# Patient Record
Sex: Female | Born: 1950 | Race: White | Hispanic: No | Smoking: Never smoker
Health system: Southern US, Community
[De-identification: ages and names within clinical notes are randomized; demographics above are authoritative.]

## PROBLEM LIST (undated history)

## (undated) DIAGNOSIS — I10 Essential (primary) hypertension: Secondary | ICD-10-CM

## (undated) DIAGNOSIS — B159 Hepatitis A without hepatic coma: Secondary | ICD-10-CM

## (undated) DIAGNOSIS — E119 Type 2 diabetes mellitus without complications: Secondary | ICD-10-CM

## (undated) DIAGNOSIS — E539 Vitamin B deficiency, unspecified: Secondary | ICD-10-CM

## (undated) DIAGNOSIS — F209 Schizophrenia, unspecified: Secondary | ICD-10-CM

## (undated) DIAGNOSIS — G909 Disorder of the autonomic nervous system, unspecified: Secondary | ICD-10-CM

## (undated) DIAGNOSIS — F32A Depression, unspecified: Secondary | ICD-10-CM

## (undated) DIAGNOSIS — M1990 Unspecified osteoarthritis, unspecified site: Secondary | ICD-10-CM

## (undated) DIAGNOSIS — E7800 Pure hypercholesterolemia, unspecified: Secondary | ICD-10-CM

## (undated) DIAGNOSIS — E559 Vitamin D deficiency, unspecified: Secondary | ICD-10-CM

## (undated) DIAGNOSIS — J309 Allergic rhinitis, unspecified: Secondary | ICD-10-CM

## (undated) DIAGNOSIS — N19 Unspecified kidney failure: Secondary | ICD-10-CM

## (undated) DIAGNOSIS — K219 Gastro-esophageal reflux disease without esophagitis: Secondary | ICD-10-CM

## (undated) DIAGNOSIS — G4700 Insomnia, unspecified: Secondary | ICD-10-CM

## (undated) DIAGNOSIS — M109 Gout, unspecified: Secondary | ICD-10-CM

## (undated) DIAGNOSIS — D649 Anemia, unspecified: Secondary | ICD-10-CM

## (undated) DIAGNOSIS — G9341 Metabolic encephalopathy: Secondary | ICD-10-CM

## (undated) DIAGNOSIS — J189 Pneumonia, unspecified organism: Secondary | ICD-10-CM

## (undated) DIAGNOSIS — F329 Major depressive disorder, single episode, unspecified: Secondary | ICD-10-CM

## (undated) DIAGNOSIS — I639 Cerebral infarction, unspecified: Secondary | ICD-10-CM

## (undated) DIAGNOSIS — A498 Other bacterial infections of unspecified site: Secondary | ICD-10-CM

## (undated) DIAGNOSIS — N179 Acute kidney failure, unspecified: Secondary | ICD-10-CM

## (undated) DIAGNOSIS — K7581 Nonalcoholic steatohepatitis (NASH): Secondary | ICD-10-CM

## (undated) HISTORY — DX: Disorder of the autonomic nervous system, unspecified: G90.9

## (undated) HISTORY — DX: Metabolic encephalopathy: G93.41

## (undated) HISTORY — DX: Hepatitis a without hepatic coma: B15.9

## (undated) HISTORY — DX: Anemia, unspecified: D64.9

## (undated) HISTORY — DX: Other bacterial infections of unspecified site: A49.8

## (undated) HISTORY — DX: Vitamin D deficiency, unspecified: E55.9

## (undated) HISTORY — DX: Insomnia, unspecified: G47.00

## (undated) HISTORY — DX: Vitamin B deficiency, unspecified: E53.9

## (undated) HISTORY — DX: Unspecified kidney failure: N19

## (undated) HISTORY — DX: Nonalcoholic steatohepatitis (NASH): K75.81

## (undated) HISTORY — DX: Gastro-esophageal reflux disease without esophagitis: K21.9

## (undated) HISTORY — PX: CYST EXCISION: SHX5701

## (undated) HISTORY — DX: Allergic rhinitis, unspecified: J30.9

## (undated) HISTORY — PX: TONSILLECTOMY: SUR1361

## (undated) HISTORY — PX: DENTAL SURGERY: SHX609

## (undated) MED FILL — Dexamethasone Sodium Phosphate Inj 100 MG/10ML: INTRAMUSCULAR | Qty: 1 | Status: AC

---
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
## 1998-11-09 ENCOUNTER — Other Ambulatory Visit: Admission: RE | Admit: 1998-11-09 | Discharge: 1998-11-09 | Payer: Self-pay | Admitting: Family Medicine

## 1999-04-13 ENCOUNTER — Ambulatory Visit (HOSPITAL_COMMUNITY): Admission: RE | Admit: 1999-04-13 | Discharge: 1999-04-13 | Payer: Self-pay | Admitting: Family Medicine

## 1999-04-13 ENCOUNTER — Encounter: Payer: Self-pay | Admitting: Family Medicine

## 2000-02-17 ENCOUNTER — Encounter: Payer: Self-pay | Admitting: Family Medicine

## 2000-02-17 ENCOUNTER — Ambulatory Visit (HOSPITAL_COMMUNITY): Admission: RE | Admit: 2000-02-17 | Discharge: 2000-02-17 | Payer: Self-pay | Admitting: Family Medicine

## 2000-11-27 ENCOUNTER — Other Ambulatory Visit: Admission: RE | Admit: 2000-11-27 | Discharge: 2000-11-27 | Payer: Self-pay | Admitting: Family Medicine

## 2000-12-04 ENCOUNTER — Ambulatory Visit (HOSPITAL_COMMUNITY): Admission: RE | Admit: 2000-12-04 | Discharge: 2000-12-04 | Payer: Self-pay | Admitting: Family Medicine

## 2001-12-09 ENCOUNTER — Ambulatory Visit (HOSPITAL_COMMUNITY): Admission: RE | Admit: 2001-12-09 | Discharge: 2001-12-09 | Payer: Self-pay | Admitting: Family Medicine

## 2002-08-10 ENCOUNTER — Ambulatory Visit (HOSPITAL_COMMUNITY): Admission: RE | Admit: 2002-08-10 | Discharge: 2002-08-10 | Payer: Self-pay | Admitting: Internal Medicine

## 2002-08-10 ENCOUNTER — Encounter: Payer: Self-pay | Admitting: Internal Medicine

## 2002-10-01 ENCOUNTER — Encounter: Payer: Self-pay | Admitting: Internal Medicine

## 2002-10-01 ENCOUNTER — Encounter: Admission: RE | Admit: 2002-10-01 | Discharge: 2002-10-01 | Payer: Self-pay | Admitting: Internal Medicine

## 2002-10-15 ENCOUNTER — Ambulatory Visit: Admission: RE | Admit: 2002-10-15 | Discharge: 2002-10-15 | Payer: Self-pay | Admitting: Orthopedic Surgery

## 2002-12-01 ENCOUNTER — Other Ambulatory Visit: Admission: RE | Admit: 2002-12-01 | Discharge: 2002-12-01 | Payer: Self-pay | Admitting: Family Medicine

## 2002-12-12 ENCOUNTER — Ambulatory Visit (HOSPITAL_COMMUNITY): Admission: RE | Admit: 2002-12-12 | Discharge: 2002-12-12 | Payer: Self-pay | Admitting: Internal Medicine

## 2003-06-10 ENCOUNTER — Ambulatory Visit (HOSPITAL_COMMUNITY): Admission: RE | Admit: 2003-06-10 | Discharge: 2003-06-10 | Payer: Self-pay | Admitting: Internal Medicine

## 2004-01-07 ENCOUNTER — Ambulatory Visit (HOSPITAL_COMMUNITY): Admission: RE | Admit: 2004-01-07 | Discharge: 2004-01-07 | Payer: Self-pay | Admitting: Family Medicine

## 2004-04-05 ENCOUNTER — Ambulatory Visit: Payer: Self-pay | Admitting: Nurse Practitioner

## 2004-04-14 ENCOUNTER — Ambulatory Visit: Payer: Self-pay | Admitting: Nurse Practitioner

## 2004-07-06 ENCOUNTER — Ambulatory Visit: Payer: Self-pay | Admitting: Nurse Practitioner

## 2004-10-06 ENCOUNTER — Ambulatory Visit: Payer: Self-pay | Admitting: Nurse Practitioner

## 2004-10-19 ENCOUNTER — Ambulatory Visit: Payer: Self-pay | Admitting: Nurse Practitioner

## 2004-12-13 ENCOUNTER — Ambulatory Visit: Payer: Self-pay | Admitting: Nurse Practitioner

## 2005-02-08 ENCOUNTER — Ambulatory Visit (HOSPITAL_COMMUNITY): Admission: RE | Admit: 2005-02-08 | Discharge: 2005-02-08 | Payer: Self-pay | Admitting: Nurse Practitioner

## 2005-03-28 ENCOUNTER — Ambulatory Visit: Payer: Self-pay | Admitting: Nurse Practitioner

## 2005-05-17 ENCOUNTER — Ambulatory Visit: Payer: Self-pay | Admitting: Nurse Practitioner

## 2005-06-21 ENCOUNTER — Ambulatory Visit: Payer: Self-pay | Admitting: Family Medicine

## 2005-06-22 ENCOUNTER — Ambulatory Visit (HOSPITAL_COMMUNITY): Admission: RE | Admit: 2005-06-22 | Discharge: 2005-06-22 | Payer: Self-pay | Admitting: Family Medicine

## 2005-07-04 ENCOUNTER — Ambulatory Visit: Payer: Self-pay | Admitting: Nurse Practitioner

## 2005-08-31 ENCOUNTER — Ambulatory Visit: Payer: Self-pay | Admitting: Nurse Practitioner

## 2005-11-28 ENCOUNTER — Ambulatory Visit: Payer: Self-pay | Admitting: Nurse Practitioner

## 2006-01-11 ENCOUNTER — Ambulatory Visit: Payer: Self-pay | Admitting: Nurse Practitioner

## 2006-03-01 ENCOUNTER — Ambulatory Visit (HOSPITAL_COMMUNITY): Admission: RE | Admit: 2006-03-01 | Discharge: 2006-03-01 | Payer: Self-pay | Admitting: Family Medicine

## 2006-08-02 ENCOUNTER — Ambulatory Visit: Payer: Self-pay | Admitting: Internal Medicine

## 2006-08-20 ENCOUNTER — Ambulatory Visit: Payer: Self-pay | Admitting: Internal Medicine

## 2006-09-28 ENCOUNTER — Observation Stay (HOSPITAL_COMMUNITY): Admission: EM | Admit: 2006-09-28 | Discharge: 2006-09-29 | Payer: Self-pay | Admitting: Emergency Medicine

## 2006-09-28 ENCOUNTER — Ambulatory Visit: Payer: Self-pay | Admitting: Family Medicine

## 2006-10-04 ENCOUNTER — Ambulatory Visit: Payer: Self-pay | Admitting: Family Medicine

## 2006-10-29 ENCOUNTER — Ambulatory Visit: Payer: Self-pay | Admitting: Cardiovascular Disease

## 2006-11-13 ENCOUNTER — Ambulatory Visit: Payer: Self-pay

## 2007-03-06 ENCOUNTER — Ambulatory Visit (HOSPITAL_COMMUNITY): Admission: RE | Admit: 2007-03-06 | Discharge: 2007-03-06 | Payer: Self-pay | Admitting: Family Medicine

## 2007-03-26 ENCOUNTER — Emergency Department (HOSPITAL_COMMUNITY): Admission: EM | Admit: 2007-03-26 | Discharge: 2007-03-26 | Payer: Self-pay | Admitting: Emergency Medicine

## 2007-04-19 ENCOUNTER — Emergency Department (HOSPITAL_COMMUNITY): Admission: EM | Admit: 2007-04-19 | Discharge: 2007-04-19 | Payer: Self-pay | Admitting: Emergency Medicine

## 2007-05-01 ENCOUNTER — Emergency Department (HOSPITAL_COMMUNITY): Admission: EM | Admit: 2007-05-01 | Discharge: 2007-05-01 | Payer: Self-pay | Admitting: Emergency Medicine

## 2007-09-17 ENCOUNTER — Encounter: Admission: RE | Admit: 2007-09-17 | Discharge: 2007-09-17 | Payer: Self-pay | Admitting: Internal Medicine

## 2007-10-25 ENCOUNTER — Ambulatory Visit: Payer: Self-pay | Admitting: Cardiovascular Disease

## 2008-03-27 ENCOUNTER — Ambulatory Visit (HOSPITAL_COMMUNITY): Admission: RE | Admit: 2008-03-27 | Discharge: 2008-03-27 | Payer: Self-pay | Admitting: Internal Medicine

## 2009-10-21 ENCOUNTER — Ambulatory Visit (HOSPITAL_COMMUNITY): Admission: RE | Admit: 2009-10-21 | Discharge: 2009-10-21 | Payer: Self-pay | Admitting: Internal Medicine

## 2010-02-27 ENCOUNTER — Encounter: Payer: Self-pay | Admitting: Internal Medicine

## 2010-06-21 NOTE — H&P (Signed)
Petty, Heather NO.:  [id number]   MEDICAL RECORD NO.:  [id number]          PATIENT TYPE:  INP   LOCATION:  3705                         FACILITY:  [facility]   PHYSICIAN:  Talbert Cage, M.D.DATE OF BIRTH:  May 31, 1950   DATE OF ADMISSION:  09/28/2006  DATE OF DISCHARGE:                              HISTORY & PHYSICAL   CHIEF COMPLAINT:  Chest pain.   HISTORY OF PRESENT ILLNESS:  Heather Petty is a 60 year old female who  presented to the emergency department today complaining of chest pain  with associated nausea, diarrhea and pain into her bilateral arms and  left side of her neck.  The chest pain is actually a tightness on her  left side.  Her arms hurt and also feel very heavy.  She has been having  this chest discomfort on and off for one week, usually lasting 4-5  minutes, however, yesterday the pain lasted all afternoon and evening  and was associated with severe nausea, cold sweats and dyspnea.  The  pain occurred again this morning but lasted only 30 minutes.  When the  pain happened again this morning her daughter asked her to come to the  ED.  The pain occurred again when she arrived at the ED and was relieved  by one sublingual nitroglycerin.  Her previous episodes of pain came on  with rest, were worse with exertion and eventually abated on their own.  Prior to this week she has not had this pain before.   REVIEW OF SYSTEMS:  Positive for increase in fatigue over one week on  top of chronic fatigue and diarrhea for three days ending approximately  two days ago.  Negative for abdominal pain, fever, cough or sick  contacts.   PAST MEDICAL HISTORY:  1. Diabetes mellitus type 2.  2. Hypertension.  3. Hyperlipidemia.  4. Bipolar disorder.  5. Question of a MRSA boil one year ago.  6. Tonsillectomy as a child.   MEDICATIONS:  1. Benazepril/hydrochlorothiazide 10/12.5.  2. Doxepin 25 mg at bedtime.  3. Omeprazole dose unknown.  4.  Cyclobenzaprine dose unknown.  5. Zetia dose unknown.  6. Simvastatin dose unknown.  7. Glipizide unknown mg b.i.d.  8. Aspirin.  9. Actos.   FAMILY HISTORY:  Unknown as the patient is adopted.   SOCIAL HISTORY:  The patient is disabled secondary to mental illness.  She lives alone.  She has a son who lives in [place] and a daughter  who lives here.  She uses a walker to walk and sleeps on three pillows.  She quit smoking about 27 years ago and denies alcohol use.   PHYSICAL EXAMINATION:  VITAL SIGNS:  Temperature 97.7, pulse 76,  respirations 18, blood pressure ranging in the ED from 129-164/65-78,  139/74 at the time of exam.  Satting 99% on room air.  GENERAL:  The patient is alert and oriented, in no acute distress,  obese, pleasant and cooperative.  HEENT:  Normocephalic, atraumatic.  Extraocular movements intact.  NECK:  Full range of motion without lymphadenopathy.  CV:  Regular rate and rhythm without murmurs, rubs or gallops.  LUNGS:  Clear to auscultation bilaterally without wheezes or crackles.  ABDOMEN:  Soft, nontender, nondistended.  Positive bowel sounds.  EXTREMITIES:  1+ pitting edema bilaterally up to the level of the knee.  Of note, the exam was limited because the patient was roomed out in the  hallway of the ED.   LABORATORY DATA:  Sodium 136, potassium 3.0, creatinine 0.6, glucose  362, point of care cardiac enzymes were negative with a CKMB of 1.0 and  a troponin I of less than 0.05.  An EKG showed normal sinus rhythm and a  chest x-ray showed no acute abnormalities and a density on the right  heart border likely epicardial fat.   ASSESSMENT AND PLAN:  This is a 60 year old female with unstable angina.   1. We will admit her to a telemetry bed.  Will give aspirin, beta      blocker and nitroglycerin.  Will check a fasting lipid panel in the      morning for risk stratification.  We will recheck an EKG in the      morning.  We will give Protonix daily  in case this is of GI origin.      The patient will likely need a cath or Cardiolite, however, the      question is if she needs inpatient or outpatient.  Will get cardiac      enzymes q.6h. x2 and if they remain negative and her EKG does not      change and she has no further episodes of chest pain, we will set      her up with an outpatient cardiology evaluation.  If she does not      rule out or she has continued chest pain, we will consult      cardiology in the morning for an inpatient workup.  2. Diabetes.  We will check an A1c and continue the patient on the      home regimen of Actos and glipizide.  As the exact doses are not      known, we will start Actos 30 mg daily and glipizide 5 mg b.i.d.      and adjust as necessary.  3. Hyperlipidemia.  Will start Zocor 40 mg daily and Zetia 10 mg      daily.  Will check a fasting lipid panel in the morning.  4. Hypertension.  The patient is having chest pain.  We will start her      on metoprolol 60 mg b.i.d. and monitor for bradycardia.  We will      hold her benazepril for the time being.  5. FEN.  We will give her a carb modified low sodium diet and hold on      fluids as the patient is taking p.o.  6. Bipolar disorder.  Will continue her home dose of doxepin.  7. Prophylaxis.  Will place SCDs and give Protonix as stated above.  8. Hypokalemia.  K-Dur 40 mEq was given in the ER.  Will check a BMP      in the morning and replace as necessary.      Heather Mustard, MD  Electronically Signed      Talbert Cage, M.D.  Electronically Signed   LM/MEDQ  D:  09/28/2006  T:  09/29/2006  Job:  [id number]

## 2010-06-21 NOTE — Assessment & Plan Note (Signed)
[facility] OFFICE NOTE   JUNE, RODE                     MRN:          [id number]  DATE:10/25/2007                            DOB:          Mar 19, 1950    Heather Petty returns today for followup.  She continues to have atypical  chest pain.  I suspect it is muscular or related to cervical spine  problems.  She is morbidly obese.  She gets around with a walker.  She  is now seeing Dr. Morene Antu, I believe at [facility].  Despite having  multiple coronary risk factors including hypercholesterolemia, diabetes,  and hypertension, she had a totally normal Myoview study done on November 13, 2006.  Her echocardiogram was also normal with an EF of 65% and no  significant valvular heart disease.   I had a long discussion with Heather Petty.  Clearly, she needs to do  something about her weight.  She is already having arthritic problems in  her hip and knees.  It has caused high blood pressure, high  cholesterolemia, and diabetes.  She is not very motivated to quit  eating.  This is a reflex she has in regards to anxiety and stress.  I  told her to talk to her medical doctor.  She should probably be referred  for possible lap band surgery.   From a cardiac perspective, she is stable.  She has atypical pain  without evidence of coronary disease and she can be seen on an as-needed  basis.   Her current meds include:  1. Benazepril 10/12.5.  2. Actos 15 a day.  3. Glipizide 10 b.i.d.  4. Doxepin 1 in the morning and 2 at night.  5. Cimetidine b.i.d.  6. Simvastatin 40.  7. Zetia 10 a day.  8. Hydrochlorothiazide 12.5 a day.  9. Aspirin a day.   PHYSICAL EXAMINATION:  GENERAL:  Her exam is remarkable for an  overweight Hispanic female in no distress.  VITAL SIGNS:  Her weight is 232, blood pressure 120/79, pulse 71 and  regular, respiratory rate 14, afebrile.  HEENT:  Unremarkable.  Carotids are without bruit.  No  lymphadenopathy,  thyromegaly, or JVP elevation.  LUNGS:  Clear to good diaphragmatic motion.  No wheezing.  HEART:  S1 and S2 with distant heart sounds.  PMI normal.  ABDOMEN:  Benign.  Bowel sounds positive.  No AAA.  No tenderness.  No  bruit.  No hepatosplenomegaly.  No hepatojugular reflux. No tenderness.  EXTREMITIES:  Distal pulse intact, +1 edema.  NEURO:  Nonfocal.  SKIN:  Warm and dry.  No muscular weakness.   EKG shows poor R-wave progression.   IMPRESSION:  1. Atypical chest pain.  Normal Myoview, normal echo.  I doubt      coronary disease.  Follow up p.r.n.  2. Diabetes.  Continue oral hypoglycemics, try to get rid of Actos      given her fluid retention.  3. Lower extremity edema.  Continue hydrochlorothiazide, low-salt      diet.  4. Central obesity.  Consider referral for lap band surgery.  5. Hypertension.  Currently well controlled.  Continue current dose of      benazepril.  She will follow up with [facility] and see [place]      on a p.r.n. basis.     Wallis Bamberg. Johnsie Cancel, MD, [facility]  Electronically Signed    PCN/MedQ  DD: 10/25/2007  DT: 10/26/2007  Job #: [id number]

## 2010-06-21 NOTE — Assessment & Plan Note (Signed)
Heather Petty NOTE   Heather Petty, Heather Petty                     MRN:          [id number]  DATE:10/29/2006                            DOB:          April 18, 1950    [name] is a 60 year old patient of Dr. Simeon Petty in [place].  She was  recently admitted to the hospital on August 22 to September 29, 2006, for  chest pain.  We were asked to help evaluate this and rule out coronary  artery disease.  The patient also has had exertional dyspnea.   Coronary risk factors include hypercholesterolemia, hypertension,  diabetes.  She is a nonsmoker.  She is fairly sedentary.  On the day  prior to her admission she had mild chest pain; however, most of her  symptoms involved leg heaviness and arm pain.  It was atypical, it was  not necessarily exertional, it lasted on and off throughout the day.  There was no pleuritic component.  The heaviness in the legs was not  sharp, it was not related to exertion, did not sound like claudication.  The heaviness in her arms also was not related to exertion.  The patient  is fairly sedentary.  She does not walk on a regular basis.  She uses a  walker due to osteoarthritis in both knees.  There is no previously  documented coronary artery disease.  She had an Adenosine Myoview in our  [facility] in September of 2004 which was normal with an EF of 75%.  She  also had a 2D echocardiogram which was normal at that same time.   She has not had followup since then.  She has not required  hospitalization since hospital discharge.  Her symptoms of chest pain,  arm pain and leg heaviness have resolved.   At the time of her problems the pain was not relieved with  nitroglycerin.   During her hospitalization her blood pressure medicines were adjusted.  She was started on beta-blockers and referred here for further followup.   REVIEW OF SYSTEMS:  Remarkable for significant knee pain.  She was  supposed to have knee surgery with Dr. Ronnie Petty back in 2004 but did not  want to have it.  She continues to suffer from bilateral osteoarthritis  and now walks with a walker.  Review of systems otherwise negative.   PMH:  HTN, Hypercholesterolemia, Diabetes, Osteoarthritis   MEDICATIONS:  1. Benazepril 10/12.5.  2. Actos, dose unknown.  3. Glipizide b.i.d., dose unknown.  4. Doxepin, one in the morning, two at night.  5. Famotidine 20 b.i.d.  6. Cyclobenzaprine daily.  7. Ibuprofen.  8. Simvastatin 40 a day.  9. An aspirin a day.  10.Zetia a day.  11.She is not clear of the beta-blocker that she was discharged on.   She denies any allergies.   FAMILY HISTORY:  Noncontributory.   The patient is divorced.  She has two older children, one who lives here  in town and one who lives in [place].  She does not work.  She sees  [facility] for her medical care.  She  continues to want to avoid any  surgery despite the debility in her legs.  She does not smoke or drink.  She is originally from [place].   EXAM:  Remarkable for an overweight, middle-aged, white female who  appears older that her stated age.  Affect is appropriate, weight is  219, blood pressure is 150/80, pulse is 84 and regular, respiratory rate  is 14, she is afebrile.  HEENT:  Normal.  CAROTIDS;  Normal without bruit.  There is no lymphadenopathy, no  thyromegaly, no JVD elevation.  LUNGS:  Clear with good diaphragmatic motion, no wheezing.  There is an  S1, S2 with distant heart sounds.  PMI is not palpable.  ABDOMEN:  Benign.  Bowel sounds positive.  There is no tenderness, no  hepatosplenomegaly, no hepatojugular reflux, no AAA, no renal bruits.  Femorals are deep and not palpable.  LOWER EXTREMITIES:  Intact pulses, trace edema, PTs are +2.  NEURO:  Nonfocal.  Skin is warm and dry.  There is no muscular weakness.   EKG showed a sinus rhythm with low voltage, poor R wave progression.   IMPRESSION:  1.  Recent hospitalization for chest pain, multiple coronary risk      factors, followup Adenosine Myoview.  2. Exertional dyspnea and an overweight female with poor R wave      progression on her EKG.  Check 2D echocardiogram to assess left      ventricular function and rule out silent anterior wall myocardial      infarction.  3. Diabetes.  Follow up with [facility].  Continue current oral      hypoglycemics, hemoglobin A1c quarterly.  4. Hyperlipidemia.  Continue simvastatin 40 a day and we will check      her records but I suspect she had LFTs during her recent      hospitalization.  5. Bilateral osteoarthritis.  I encouraged the patient, particularly      if her heart tests turn out normal, to follow up with Dr. Ronnie Petty.      She clearly has limiting osteoarthritis and would benefit from a      knee replacement.  6. Hypertension, currently fairly well controlled.  Continue      benazepril and diuretic.  Patient's activity levels are limited due      to her knee pain, which is one of the reasons I encouraged her to      seek further care for this.   I will see her back in a year so long as her Myoview and echo are low  risk.     Wallis Bamberg. Heather Cancel, MD, [facility]  Electronically Signed    PCN/MedQ  DD: 10/29/2006  DT: 10/29/2006  Job #: [id number]

## 2010-06-21 NOTE — Discharge Summary (Signed)
Petty, [name] NO.:  [id number]   MEDICAL RECORD NO.:  [id number]          PATIENT TYPE:  INP   LOCATION:  3705                         FACILITY:  [facility]   PHYSICIAN:  Orland Mustard, MD       DATE OF BIRTH:  January 28, 1951   DATE OF ADMISSION:  09/28/2006  DATE OF DISCHARGE:  09/29/2006                               DISCHARGE SUMMARY   PRIMARY CARE PHYSICIAN:  The patient receives her medical care from  [facility].   REASON FOR ADMISSION:  Ongoing chest pain, worrisome for unstable  angina.   ADMISSION LABORATORY:  Potassium 3.0, blood glucose 362, creatinine 0.6.  Point of care cardiac enzymes were negative.   ADMISSION STUDIES:  EKG showed normal sinus rhythm and a chest x-ray  showed no acute abnormality.   PRIMARY DISCHARGE DIAGNOSES:  1. Chest pain.  2. Diabetes mellitus.  3. Hyperlipidemia.  4. Hypertension.   NEW DISCHARGE MEDICATIONS:  Nitroglycerin 0.4 mg under the tongue every  5 minutes when needed for chest pain with no more than 3 doses in 15  minutes and instructions that if patient required 3 doses or more to  relieve her chest pain then she should be seen immediately at the ER.   MEDICATIONS TO CONTINUE AT DISCHARGE:  1. Benazepril HCl 10/12.5 mg 1 tablet daily.  2. Doxepin 25 mg q.h.s.  3. Omeprazole 20 mg 2 tablets daily.  4. Cyclobenzaprine 10 mg t.i.d. p.r.n.  5. Zetia 10 mg daily.  6. Simvastatin 40 mg with dinner.  7. Glipizide 5 mg b.i.d.  8. Aspirin 325 mg daily.  9. Actos 30 mg daily.   BRIEF HOSPITAL COURSE:  Heather Petty is 60 year old female who presented  to the hospital with a week-long history of intermittent chest pain  associated with nausea and diaphoresis.  Patient was admitted to rule  out a MI.  On admission, she was found to have normal point of care  cardiac enzymes and a normal EKG.  A chest x-ray was also found to be  normal.  She was admitted and begun on a beta blocker, aspirin and  morphine  p.r.n. for chest pain if needed.  She remained without chest  pain throughout the night.  Cardiac enzymes came back negative and  morning EKG remained unchanged.  Therefore, patient was found to have  ruled out for a myocardial infarction.  However, since she does have  diabetes, hypertension and hyperlipidemia, we felt it to be wise that  the patient receive evaluation by a cardiologist, but since she remained  without chest pain and ruled out for MI, we did not feel that this  needed to be done as an inpatient.  She was discharged home the next  morning, September 29, 2006, with discharge instructions to follow up at  [facility] within the week, so that they may set her up for an  outpatient appointment at a cardiologist office for further evaluation.  We believe the patient may need Cardiolite or Myoview exam if not a  cardiac catheterization.  However, we obviously leave this up to the  discretion  of the cardiologist, Dr. Emilee Hero.  She was also sent home with  a prescription for nitroglycerin and instructions on how to use it.  She  was instructed that should she have chest pain that requires more than 2  nitros to relieve it, she should report to the emergency department  immediately to be seen by a physician.  The patient expressed  understanding of the instructions for the nitroglycerin, as well as the  importance of following up at [place] to be set up with a  cardiology outpatient appointment.      Orland Mustard, MD  Electronically Signed     LM/MEDQ  D:  10/02/2006  T:  10/02/2006  Job:  [id number]   cc:   Myriam Jacobson

## 2010-10-31 LAB — URINALYSIS, ROUTINE W REFLEX MICROSCOPIC
Ketones, ur: NEGATIVE
Leukocytes, UA: NEGATIVE
Nitrite: NEGATIVE
Protein, ur: NEGATIVE
Urobilinogen, UA: 0.2

## 2010-10-31 LAB — URINE MICROSCOPIC-ADD ON

## 2010-11-03 ENCOUNTER — Other Ambulatory Visit (HOSPITAL_COMMUNITY): Payer: Self-pay | Admitting: Internal Medicine

## 2010-11-03 DIAGNOSIS — Z1231 Encounter for screening mammogram for malignant neoplasm of breast: Secondary | ICD-10-CM

## 2010-11-18 LAB — TROPONIN I: Troponin I: 0.03

## 2010-11-18 LAB — CBC
MCHC: 34
MCV: 89.4
Platelets: 163
RDW: 13.4

## 2010-11-18 LAB — BASIC METABOLIC PANEL
BUN: 18
CO2: 30
Calcium: 9.4
Chloride: 104
Creatinine, Ser: 0.74

## 2010-11-18 LAB — POCT I-STAT CREATININE
Creatinine, Ser: 0.6
Operator id: 288331

## 2010-11-18 LAB — CK TOTAL AND CKMB (NOT AT ARMC)
CK, MB: 1.3
Relative Index: INVALID
Total CK: 73

## 2010-11-18 LAB — I-STAT 8, (EC8 V) (CONVERTED LAB)
Bicarbonate: 23.7
Glucose, Bld: 362 — ABNORMAL HIGH
HCT: 39
Potassium: 3 — ABNORMAL LOW
Sodium: 136
TCO2: 25

## 2010-11-18 LAB — TSH: TSH: 2.752

## 2010-11-18 LAB — CARDIAC PANEL(CRET KIN+CKTOT+MB+TROPI)
CK, MB: 1.1
Relative Index: INVALID
Troponin I: 0.02

## 2010-11-18 LAB — LIPID PANEL
Cholesterol: 148
LDL Cholesterol: 67

## 2010-11-18 LAB — HEMOGLOBIN A1C: Hgb A1c MFr Bld: 10.3 — ABNORMAL HIGH

## 2010-11-18 LAB — POCT CARDIAC MARKERS
Operator id: 288331
Troponin i, poc: <0.05

## 2010-11-21 ENCOUNTER — Ambulatory Visit (HOSPITAL_COMMUNITY)
Admission: RE | Admit: 2010-11-21 | Discharge: 2010-11-21 | Disposition: A | Discharge status: Home / Self Care | Payer: Medicaid Other | Source: Ambulatory Visit | Attending: Internal Medicine | Admitting: Internal Medicine

## 2010-11-21 DIAGNOSIS — Z1231 Encounter for screening mammogram for malignant neoplasm of breast: Secondary | ICD-10-CM | POA: Insufficient documentation

## 2010-12-27 ENCOUNTER — Emergency Department (HOSPITAL_COMMUNITY): Payer: Medicaid Other

## 2010-12-27 ENCOUNTER — Emergency Department (HOSPITAL_COMMUNITY)
Admission: EM | Admit: 2010-12-27 | Discharge: 2010-12-28 | Disposition: A | Discharge status: Home / Self Care | Payer: Medicaid Other | Attending: Emergency Medicine | Admitting: Emergency Medicine

## 2010-12-27 ENCOUNTER — Other Ambulatory Visit: Payer: Self-pay

## 2010-12-27 ENCOUNTER — Encounter: Payer: Self-pay | Admitting: *Deleted

## 2010-12-27 DIAGNOSIS — I10 Essential (primary) hypertension: Secondary | ICD-10-CM

## 2010-12-27 DIAGNOSIS — M25519 Pain in unspecified shoulder: Secondary | ICD-10-CM | POA: Insufficient documentation

## 2010-12-27 DIAGNOSIS — M25476 Effusion, unspecified foot: Secondary | ICD-10-CM | POA: Insufficient documentation

## 2010-12-27 DIAGNOSIS — R51 Headache: Secondary | ICD-10-CM | POA: Insufficient documentation

## 2010-12-27 DIAGNOSIS — R0602 Shortness of breath: Secondary | ICD-10-CM | POA: Insufficient documentation

## 2010-12-27 DIAGNOSIS — M7989 Other specified soft tissue disorders: Secondary | ICD-10-CM | POA: Insufficient documentation

## 2010-12-27 DIAGNOSIS — R0789 Other chest pain: Secondary | ICD-10-CM

## 2010-12-27 DIAGNOSIS — M25473 Effusion, unspecified ankle: Secondary | ICD-10-CM | POA: Insufficient documentation

## 2010-12-27 DIAGNOSIS — J45909 Unspecified asthma, uncomplicated: Secondary | ICD-10-CM | POA: Insufficient documentation

## 2010-12-27 DIAGNOSIS — Z79899 Other long term (current) drug therapy: Secondary | ICD-10-CM | POA: Insufficient documentation

## 2010-12-27 DIAGNOSIS — M25512 Pain in left shoulder: Secondary | ICD-10-CM

## 2010-12-27 DIAGNOSIS — Z794 Long term (current) use of insulin: Secondary | ICD-10-CM | POA: Insufficient documentation

## 2010-12-27 DIAGNOSIS — E669 Obesity, unspecified: Secondary | ICD-10-CM | POA: Insufficient documentation

## 2010-12-27 DIAGNOSIS — R071 Chest pain on breathing: Secondary | ICD-10-CM | POA: Insufficient documentation

## 2010-12-27 HISTORY — DX: Essential (primary) hypertension: I10

## 2010-12-27 LAB — GLUCOSE, CAPILLARY

## 2010-12-27 LAB — CBC
Hemoglobin: 12.8 g/dL (ref 12.0–15.0)
MCH: 31.3 pg (ref 26.0–34.0)
RBC: 4.09 MIL/uL (ref 3.87–5.11)

## 2010-12-27 LAB — BASIC METABOLIC PANEL
BUN: 17 mg/dL (ref 6–23)
Chloride: 102 mEq/L (ref 96–112)
Glucose, Bld: 131 mg/dL — ABNORMAL HIGH (ref 70–99)
Potassium: 3.5 mEq/L (ref 3.5–5.1)

## 2010-12-27 LAB — URINALYSIS, ROUTINE W REFLEX MICROSCOPIC
Bilirubin Urine: NEGATIVE
Hgb urine dipstick: NEGATIVE
Specific Gravity, Urine: 1.016 (ref 1.005–1.030)
pH: 7 (ref 5.0–8.0)

## 2010-12-27 LAB — POCT I-STAT TROPONIN I

## 2010-12-27 LAB — DIFFERENTIAL
Lymphs Abs: 2.4 10*3/uL (ref 0.7–4.0)
Monocytes Relative: 5 % (ref 3–12)
Neutro Abs: 6.7 10*3/uL (ref 1.7–7.7)
Neutrophils Relative %: 69 % (ref 43–77)

## 2010-12-27 MED ORDER — OXYCODONE-ACETAMINOPHEN 5-325 MG PO TABS
1.0000 | ORAL_TABLET | Freq: Once | ORAL | Status: AC
  Administered 2010-12-27: 1 via ORAL
  Filled 2010-12-27: qty 1

## 2010-12-27 MED ORDER — MORPHINE SULFATE 4 MG/ML IJ SOLN
4.0000 mg | Freq: Once | INTRAMUSCULAR | Status: AC
  Administered 2010-12-27: 4 mg via INTRAVENOUS
  Filled 2010-12-27: qty 1

## 2010-12-27 MED ORDER — OXYCODONE-ACETAMINOPHEN 5-325 MG PO TABS: 1.0000 | ORAL_TABLET | Freq: Four times a day (QID) | ORAL | Status: AC | PRN

## 2010-12-27 MED ORDER — ONDANSETRON HCL 4 MG/2ML IJ SOLN
4.0000 mg | Freq: Once | INTRAMUSCULAR | Status: AC
  Administered 2010-12-27: 4 mg via INTRAVENOUS
  Filled 2010-12-27: qty 2

## 2010-12-27 NOTE — ED Provider Notes (Signed)
Pt seen and evaluated.  Presents with left shoulder pain, radiates down arm and up to left side of neck.  Is reproducible on palpation, worse with movement of neck and shoulder.  Some tingling to fingers.  No SOB.  Pain does radiate from neck into left side of head.  Based on my exam, I suspect this is musculoskeletal, possible radiculopathy.  Doubt ACS.  Will stress close f/u with PMD  Rolan Bucco, MD 12/27/10 [phone]

## 2010-12-27 NOTE — ED Notes (Signed)
Assisted patient to [facility] Patient urinated and assisted back to bed

## 2010-12-27 NOTE — ED Notes (Addendum)
Per EMS- Patient's care giver found blood pressure was high (250/110). EMS vs was 212/100 on arrival HR 74. Patient reports with headache with HTN. Patient states she took 324mg  of aspirin at home and EMS did not administer any medication. EMS 12 lead EKG was unremarkable. Patient denies vomiting and fever. Patient states nausea throughout today.

## 2010-12-27 NOTE — ED Notes (Signed)
Patient states her blood pressure has been high x3 days and headaches. Home care assistant is at the bedside. Patient states she uses a wheelchair at [place] and is able to stand and pivit. Patient is unable to answer question and sitter states patient has been depressed for several days. Patient appears to be very uncomfortable with being in hospital and is asking to go home. Sitter states that she is to keep patients daughter posted with what is happening here at the hospital.

## 2010-12-27 NOTE — ED Provider Notes (Signed)
History     CSN: [id number] Arrival date & time: 12/27/2010  5:50 PM   First MD Initiated Contact with Patient 12/27/10 1758      Chief Complaint  Patient presents with  . Hypertension    (Consider location/radiation/quality/duration/timing/severity/associated sxs/prior treatment) HPI Comments: Patient who his poor historian presents with high blood pressure, chest and neck pain since Saturday (3 days ago). Most of the history is given by home nurse aide who is at bedside. Pt was noted to have BP in 200's systolic today. EMS was called for this and L chest and neck pain. Patient has also been breathing 'heavy'. Patient states pain waxes and wanes, but currently states she is pain free. Denies diaphoresis, palpitations, or injury. No fever, URI symptoms, N/V/D.   Patient is a 60 y.o. female presenting with chest pain. The history is provided by the patient and a caregiver.  Chest Pain The chest pain began 3 - 5 days ago. Chest pain occurs intermittently. The quality of the pain is described as dull. The pain radiates to the left neck and left shoulder. Primary symptoms include shortness of breath. Pertinent negatives for primary symptoms include no fever, no syncope, no cough, no palpitations, no abdominal pain, no nausea, no vomiting and no dizziness.  Associated symptoms include lower extremity edema.  Pertinent negatives for associated symptoms include no diaphoresis and no numbness. She tried aspirin for the symptoms. Risk factors include obesity, lack of exercise and sedentary lifestyle.  Her past medical history is significant for diabetes.  Procedure history is positive for echocardiogram.     Past Medical History  Diagnosis Date  . Hypertension   . Asthma     History reviewed. No pertinent past surgical history.  History reviewed. No pertinent family history.  History  Substance Use Topics  . Smoking status: Never Smoker   . Smokeless tobacco: Not on file  . Alcohol  Use: No    OB History    Grav Para Term Preterm Abortions TAB SAB Ect Mult Living                  Review of Systems  Constitutional: Negative for fever and diaphoresis.  HENT: Negative for sore throat and rhinorrhea.   Eyes: Negative for redness.  Respiratory: Positive for shortness of breath. Negative for cough.   Cardiovascular: Positive for chest pain and leg swelling. Negative for palpitations and syncope.  Gastrointestinal: Negative for nausea, vomiting and abdominal pain.  Genitourinary: Negative for dysuria.  Musculoskeletal: Negative for myalgias.  Skin: Negative for rash.  Neurological: Negative for dizziness, light-headedness and numbness.    Allergies  Review of patient's allergies indicates no known allergies.  Home Medications   Current Outpatient Rx  Name Route Sig Dispense Refill  . BENAZEPRIL HCL 10 MG PO TABS Oral Take 10 mg by mouth daily.      Marland Kitchen DOXEPIN HCL 25 MG PO CAPS Oral Take 25 mg by mouth at bedtime.      Marland Kitchen FAMOTIDINE 20 MG PO TABS Oral Take 20 mg by mouth 2 (two) times daily.      Marland Kitchen GLIPIZIDE ER 10 MG PO TB24 Oral Take 10 mg by mouth 2 (two) times daily.      Marland Kitchen HYDROCHLOROTHIAZIDE 25 MG PO TABS Oral Take 12.5 mg by mouth daily.      . INSULIN GLARGINE 100 UNIT/ML  SOLN Subcutaneous Inject 25 Units into the skin at bedtime.      Marland Kitchen PIOGLITAZONE HCL 30 MG  PO TABS Oral Take 30 mg by mouth daily.        BP 176/61  Pulse 71  Temp(Src) 99 F (37.2 C) (Oral)  Resp 16  SpO2 97%  Physical Exam  Nursing note and vitals reviewed. Constitutional: She is oriented to person, place, and time. She appears well-developed and well-nourished.       obese  HENT:  Head: Normocephalic and atraumatic.  Eyes: Conjunctivae are normal. Pupils are equal, round, and reactive to light.  Neck: Normal range of motion. Neck supple. No JVD present.  Cardiovascular: Normal rate, regular rhythm, normal heart sounds and intact distal pulses.  Exam reveals no gallop and  no friction rub.   No murmur heard. Pulmonary/Chest: Effort normal. No respiratory distress. She has no wheezes. She has no rales. She exhibits tenderness.       Reproducible tenderness with palpation over L neck, trapezius, left upper chest wall. Pain is worsened with deep inspiration and movement of left arm.   Abdominal: Soft. Bowel sounds are normal. There is no tenderness. There is no rebound and no guarding.  Musculoskeletal: Normal range of motion. She exhibits edema.       1+ bilateral pitting LE edema to mid-ankles.   Neurological: She is alert and oriented to person, place, and time.  Skin: Skin is warm and dry. She is not diaphoretic.  Psychiatric: She has a normal mood and affect.    ED Course  Procedures (including critical care time)  Labs Reviewed  BASIC METABOLIC PANEL - Abnormal; Notable for the following:    Glucose, Bld 131 (*)    GFR calc non Af Amer 73 (*)    GFR calc Af Amer 85 (*)    All other components within normal limits  URINALYSIS, ROUTINE W REFLEX MICROSCOPIC - Abnormal; Notable for the following:    Appearance CLOUDY (*)    All other components within normal limits  GLUCOSE, CAPILLARY - Abnormal; Notable for the following:    Glucose-Capillary 134 (*)    All other components within normal limits  CBC  DIFFERENTIAL  POCT I-STAT TROPONIN I  I-STAT TROPONIN I  POCT CBG MONITORING   Dg Chest 2 View  12/27/2010  *RADIOLOGY REPORT*  Clinical Data: Hypertension, chest pain, headache, history asthma  CHEST - 2 VIEW  Comparison: 09/28/2006  Findings: Borderline enlargement of cardiac silhouette. Pulmonary vascular congestion. Mediastinal contours normal. Minimal bibasilar atelectasis. Remaining lungs clear. No pleural effusion or pneumothorax. Bones unremarkable.  IMPRESSION: Borderline enlargement of cardiac silhouette with pulmonary vascular congestion. Bibasilar atelectasis.  Original Report Authenticated By: Lollie Marrow, M.D.     No diagnosis  found.  6:40 PM Patient seen and examined. ASA given by EMS. Pt denies pain at this time. Labs/imaging pending. BP is 170's systolic. Will not give medicine to lower at this time.   6:45 PM Pt has seen Dr. Eden Emms as of 2009. She had atypical CP at that time, with normal ECHO and myoview. Did not suspect CAD then.    Date: 12/27/2010  Rate: 67  Rhythm: normal sinus rhythm  QRS Axis: normal  Intervals: normal  ST/T Wave abnormalities: normal  Conduction Disutrbances:none  Narrative Interpretation:   Old EKG Reviewed: none available   11:34 PM Pt seen by Dr. Fredderick Phenix earlier tonight. Pt is more comfortable after pain medication. Spoke with patient and son who is now at bedside regarding results. Will d/c to home on pain medication. She has PCP f/u with Dr. Concepcion Elk. Will discharge to  home.  Patient told to follow-up with PCP in next week.  Patient told to return to ED with persistent or different CP associated with exertion, sweating, racing heart, palpitations, shortness of breath, lightheadedness, radiation of pain into jaw, neck, or arms.  Patient verbalizes understanding and agrees with plan.  Counseled on cautions with using medication and risk of falling.   11:38 PM Patient counseled on use of narcotic pain medications. Counseled not to combine these medications with others containing tylenol. Urged not to drink alcohol, drive, or perform any other activities that requires focus while taking these medications. The patient verbalizes understanding and agrees with the plan.  11:46 PM Patient was informed of high blood pressure reading today.  Patient was counseled about long-term health problems hypertension can cause and was urged to follow-up with a primary care doctor in the next week for a blood pressure recheck.  Patient verbalized understanding.   MDM  Patient with reproducible chest pain.  Doubt cardiac origin of pain given: nature of pain reproducable with palp and deep inspiration, neg  cardiac enzymes, normal EKG, constant pain for several days, no exertional component, lack of accompanying sx including palps, sweating, SOB, radiation. She had similar symptoms and work-up in 2009. Pt appears well and is stable for d/c to home. She has PCP f/u. Pt with HTN however this has improved in ED -- will have patient continue previously prescribed meds and f/u with PCP.         Eustace Moore [place], [place] 12/27/10 [phone]

## 2010-12-28 NOTE — ED Provider Notes (Signed)
Medical screening examination/treatment/procedure(s) were performed by non-physician practitioner and as supervising physician I was immediately available for consultation/collaboration.   Philippa Vessey, MD 12/28/10 0012 

## 2011-04-23 ENCOUNTER — Emergency Department (HOSPITAL_COMMUNITY)
Admission: EM | Admit: 2011-04-23 | Discharge: 2011-04-24 | Disposition: A | Discharge status: Home / Self Care | Payer: Medicaid Other | Attending: Emergency Medicine | Admitting: Emergency Medicine

## 2011-04-23 ENCOUNTER — Encounter (HOSPITAL_COMMUNITY): Payer: Self-pay | Admitting: *Deleted

## 2011-04-23 ENCOUNTER — Emergency Department (HOSPITAL_COMMUNITY)
Admission: EM | Admit: 2011-04-23 | Discharge: 2011-04-23 | Disposition: A | Discharge status: Nursing Home (Includes ICF) | Payer: Medicaid Other | Source: Home / Self Care | Attending: Emergency Medicine | Admitting: Emergency Medicine

## 2011-04-23 ENCOUNTER — Encounter (HOSPITAL_COMMUNITY): Payer: Self-pay

## 2011-04-23 DIAGNOSIS — Z794 Long term (current) use of insulin: Secondary | ICD-10-CM | POA: Insufficient documentation

## 2011-04-23 DIAGNOSIS — E7800 Pure hypercholesterolemia, unspecified: Secondary | ICD-10-CM | POA: Insufficient documentation

## 2011-04-23 DIAGNOSIS — E119 Type 2 diabetes mellitus without complications: Secondary | ICD-10-CM | POA: Insufficient documentation

## 2011-04-23 DIAGNOSIS — M79604 Pain in right leg: Secondary | ICD-10-CM

## 2011-04-23 DIAGNOSIS — M79606 Pain in leg, unspecified: Secondary | ICD-10-CM

## 2011-04-23 DIAGNOSIS — I10 Essential (primary) hypertension: Secondary | ICD-10-CM | POA: Insufficient documentation

## 2011-04-23 DIAGNOSIS — J45909 Unspecified asthma, uncomplicated: Secondary | ICD-10-CM | POA: Insufficient documentation

## 2011-04-23 DIAGNOSIS — M79609 Pain in unspecified limb: Secondary | ICD-10-CM

## 2011-04-23 HISTORY — DX: Unspecified osteoarthritis, unspecified site: M19.90

## 2011-04-23 HISTORY — DX: Pure hypercholesterolemia, unspecified: E78.00

## 2011-04-23 HISTORY — DX: Morbid (severe) obesity due to excess calories: E66.01

## 2011-04-23 LAB — CBC
HCT: 38 % (ref 36.0–46.0)
Hemoglobin: 13.1 g/dL (ref 12.0–15.0)
MCH: 31 pg (ref 26.0–34.0)
MCHC: 34.5 g/dL (ref 30.0–36.0)
MCV: 90 fL (ref 78.0–100.0)
Platelets: 209 10*3/uL (ref 150–400)
RBC: 4.22 MIL/uL (ref 3.87–5.11)
RDW: 13.5 % (ref 11.5–15.5)
WBC: 9.4 10*3/uL (ref 4.0–10.5)

## 2011-04-23 LAB — DIFFERENTIAL
Basophils Absolute: 0 10*3/uL (ref 0.0–0.1)
Basophils Relative: 0 % (ref 0–1)
Eosinophils Absolute: 0.1 10*3/uL (ref 0.0–0.7)
Eosinophils Relative: 1 % (ref 0–5)
Lymphocytes Relative: 33 % (ref 12–46)
Lymphs Abs: 3.1 10*3/uL (ref 0.7–4.0)
Monocytes Absolute: 0.5 10*3/uL (ref 0.1–1.0)
Monocytes Relative: 6 % (ref 3–12)
Neutro Abs: 5.6 10*3/uL (ref 1.7–7.7)
Neutrophils Relative %: 60 % (ref 43–77)

## 2011-04-23 LAB — D-DIMER, QUANTITATIVE: D-Dimer, Quant: 2.84 ug/mL-FEU — ABNORMAL HIGH (ref 0.00–0.48)

## 2011-04-23 LAB — BASIC METABOLIC PANEL
BUN: 12 mg/dL (ref 6–23)
CO2: 25 mEq/L (ref 19–32)
Calcium: 10 mg/dL (ref 8.4–10.5)
Chloride: 101 mEq/L (ref 96–112)
Creatinine, Ser: 0.63 mg/dL (ref 0.50–1.10)
GFR calc Af Amer: >90 mL/min (ref >90)
GFR calc non Af Amer: >90 mL/min (ref >90)
Glucose, Bld: 124 mg/dL — ABNORMAL HIGH (ref 70–99)
Potassium: 3.6 mEq/L (ref 3.5–5.1)
Sodium: 138 mEq/L (ref 135–145)

## 2011-04-23 LAB — POCT I-STAT, CHEM 8
BUN: 12 mg/dL (ref 6–23)
HCT: 40 % (ref 36.0–46.0)
Hemoglobin: 13.6 g/dL (ref 12.0–15.0)
Sodium: 141 mEq/L (ref 135–145)
TCO2: 22 mmol/L (ref 0–100)

## 2011-04-23 MED ORDER — OXYCODONE-ACETAMINOPHEN 5-325 MG PO TABS
1.0000 | ORAL_TABLET | Freq: Once | ORAL | Status: AC
  Administered 2011-04-23: 1 via ORAL
  Filled 2011-04-23: qty 1

## 2011-04-23 MED ORDER — ENOXAPARIN SODIUM 150 MG/ML ~~LOC~~ SOLN
140.0000 mg | SUBCUTANEOUS | Status: AC
  Administered 2011-04-23: 140 mg via SUBCUTANEOUS
  Filled 2011-04-23: qty 1

## 2011-04-23 NOTE — ED Provider Notes (Signed)
History     CSN: [id number]  Arrival date & time 04/23/11  1403   First MD Initiated Contact with Patient 04/23/11 1545      Chief Complaint  Patient presents with  . Leg Pain    pain in rt hip radiating down to rt foot    (Consider location/radiation/quality/duration/timing/severity/associated sxs/prior treatment) HPI Comments: Patient presents with atraumatic right lower extremity pain x3 days. Patient states that the pain radiates up to her thigh. Pain is worse with extending her leg, and walking. Symptoms are slightly better with massage. No nausea, vomiting, chest pain, shortness of breath. Has had similar symptoms intermittently over the past year, but no evaluation for this. Patient also reports "numbness" and paresthesias in her right lower extremity. No change in physical activity. Patient denies recent or remote fall. Patient is on a diuretic, and is supposed to be taking potassium which she is not consistently taking. Patient is largely wheelchair-bound.  Patient is a 61 y.o. female presenting with leg pain. The history is provided by the patient. No language interpreter was used.  Leg Pain  The incident occurred more than 2 days ago. There was no injury mechanism. The pain is present in the right leg and right thigh. The quality of the pain is described as burning, aching and sharp. The pain has been constant since onset. Associated symptoms include numbness. The symptoms are aggravated by bearing weight and palpation. Treatments tried: Massage, heat. The treatment provided mild relief.    Past Medical History  Diagnosis Date  . Hypertension   . Asthma   . Diabetes mellitus   . Arthritis   . High cholesterol   . Morbid obesity     Past Surgical History  Procedure Date  . Tonsillectomy     History reviewed. No pertinent family history.  History  Substance Use Topics  . Smoking status: Never Smoker   . Smokeless tobacco: Not on file  . Alcohol Use: No    OB  History    Grav Para Term Preterm Abortions TAB SAB Ect Mult Living                  Review of Systems  Constitutional: Negative for fever.  Respiratory: Negative for shortness of breath.   Cardiovascular: Negative for chest pain.  Gastrointestinal: Negative for nausea and vomiting.  Musculoskeletal: Positive for myalgias, back pain and arthralgias.  Skin: Negative for color change.  Neurological: Positive for numbness.    Allergies  Peanut-containing drug products  Home Medications   Current Outpatient Rx  Name Route Sig Dispense Refill  . ASPIRIN 325 MG PO TABS Oral Take 325 mg by mouth daily.    Marland Kitchen BENAZEPRIL HCL 10 MG PO TABS Oral Take 10 mg by mouth daily.      Marland Kitchen VITAMIN D PO Oral Take 1 tablet by mouth.    . DOXEPIN HCL 25 MG PO CAPS Oral Take 25 mg by mouth at bedtime.      Marland Kitchen FAMOTIDINE 20 MG PO TABS Oral Take 20 mg by mouth 2 (two) times daily.      Marland Kitchen GLIPIZIDE ER 10 MG PO TB24 Oral Take 10 mg by mouth 2 (two) times daily.      . INSULIN GLARGINE 100 UNIT/ML [place] SOLN Subcutaneous Inject 25 Units into the skin at bedtime.      Marland Kitchen ONE-DAILY MULTI VITAMINS PO TABS Oral Take 1 tablet by mouth daily.    Marland Kitchen PIOGLITAZONE HCL 30 MG PO TABS  Oral Take 30 mg by mouth daily.      Marland Kitchen POTASSIUM CHLORIDE CRYS ER 10 MEQ PO TBCR Oral Take 10 mEq by mouth 2 (two) times daily.    Marland Kitchen HYDROCHLOROTHIAZIDE 25 MG PO TABS Oral Take 12.5 mg by mouth daily.        BP 145/79  Pulse 73  Temp(Src) 98.6 F (37 C) (Oral)  Resp 16  SpO2 100%  Physical Exam  Nursing note and vitals reviewed. Constitutional: She is oriented to person, place, and time. She appears well-developed and well-nourished. No distress.  HENT:  Head: Normocephalic and atraumatic.  Eyes: Conjunctivae and EOM are normal.  Neck: Normal range of motion.  Cardiovascular: Normal rate and normal heart sounds.   Pulmonary/Chest: Effort normal and breath sounds normal.  Abdominal: She exhibits no distension.  Musculoskeletal:  Normal range of motion.       Right lower leg: She exhibits tenderness.       Mild tenderness right lumbar spine. No pain with internal/external rotation of right hip. right calf tenderness. Tenderness along medial aspect of the inner right thigh. Pain with plantarflexion, dorsiflexion. No redness. Calves symmetric. No edema. Skin intact. DP 2+ sensation to sharp/soft diminished bilaterally  Neurological: She is alert and oriented to person, place, and time.  Skin: Skin is warm and dry.  Psychiatric: She has a normal mood and affect. Her behavior is normal. Judgment and thought content normal.    ED Course  Procedures (including critical care time)  Labs Reviewed - No data to display No results found.   1. Right leg pain       MDM  Patient reports similar symptoms over the past year. No chest pain, shortness of breath, unilateral leg swelling. Patient does have posterior calf tenderness, and tenderness along the medial thigh.  No evidence of cellulitis, abscess. No history of cancer, lower limb paralysis, bedridden for more than 3 days, major surgery in the past 4 weeks, history of DVT, or collateral superficial veins. Wells criteria is 1/9.   Unable to obtain blood to evaluate for electrolyte imbalance or dimer despite multiple attempts. Primary concern is DVT versus hypokalemia. Patient is not on any statins that would cause muscle cramping. Transferring to ED for further evaluation   Cherly Beach, MD 04/23/11 2032

## 2011-04-23 NOTE — ED Notes (Signed)
Report given to [facility], ED Triage nurse. Pt transported by Shuttle.

## 2011-04-23 NOTE — ED Notes (Signed)
Pt complaint of pain in rt side of lower back radiating down to rt foot. Denies injury. States pain started Friday.

## 2011-04-23 NOTE — ED Notes (Signed)
Report given to [facility], ED triage nurse. Pt transported by shuttle.

## 2011-04-23 NOTE — ED Notes (Signed)
Pt states that Urgent care attempted to draw labs on pt x7 with no success.

## 2011-04-23 NOTE — ED Notes (Signed)
Entire rt leg pain for 3 days.  No known injury.  Sent here from [facility]

## 2011-04-23 NOTE — ED Provider Notes (Signed)
History     CSN: [id number]  Arrival date & time 04/23/11  Heather Petty   First MD Initiated Contact with Patient 04/23/11 2051      Chief Complaint  Patient presents with  . Leg Pain    (Consider location/radiation/quality/duration/timing/severity/associated sxs/prior treatment) Patient is a 61 y.o. female presenting with leg pain. The history is provided by the patient.  Leg Pain  The incident occurred more than 2 days ago. The incident occurred at home. There was no injury mechanism. The pain is present in the right leg. The pain is at a severity of 9/10. The pain is severe. The pain has been constant since onset. Pertinent negatives include no numbness, no inability to bear weight, no loss of motion, no muscle weakness, no loss of sensation and no tingling. The symptoms are aggravated by bearing weight and palpation.  Patient reports a three-day history of right lower extremity pain. States the pain extends from her foot up to her upper thigh area. The worse pain is to the right posterior calf. Pain is worse when she bears weight. Patient is not a smoker, denies recent extended travel  by air or car, denies family history of DVT, denies shortness of breath, chest pain or previous history of DVT.  Past Medical History  Diagnosis Date  . Hypertension   . Asthma   . Diabetes mellitus   . Arthritis   . High cholesterol   . Morbid obesity     Past Surgical History  Procedure Date  . Tonsillectomy     No family history on file.  History  Substance Use Topics  . Smoking status: Never Smoker   . Smokeless tobacco: Not on file  . Alcohol Use: No    OB History    Grav Para Term Preterm Abortions TAB SAB Ect Mult Living                  Review of Systems  Constitutional: Negative.   HENT: Negative.   Eyes: Negative.   Respiratory: Negative.   Cardiovascular: Negative.   Gastrointestinal: Negative.   Genitourinary: Negative.   Musculoskeletal: Negative.   Skin: Negative.     Neurological: Negative.  Negative for tingling and numbness.  Hematological: Negative.   Psychiatric/Behavioral: Negative.     Allergies  Peanut-containing drug products  Home Medications   Current Outpatient Rx  Name Route Sig Dispense Refill  . ASPIRIN 325 MG PO TABS Oral Take 325 mg by mouth daily.    Marland Kitchen BENAZEPRIL HCL 10 MG PO TABS Oral Take 10 mg by mouth daily.      Marland Kitchen VITAMIN D PO Oral Take 1 tablet by mouth.    . COLESEVELAM HCL 3.75 G PO PACK Oral Take 1 each by mouth daily.    Marland Kitchen DOXEPIN HCL 25 MG PO CAPS Oral Take 25 mg by mouth at bedtime.      Marland Kitchen FAMOTIDINE 20 MG PO TABS Oral Take 20 mg by mouth 2 (two) times daily.      Marland Kitchen GLIPIZIDE ER 10 MG PO TB24 Oral Take 10 mg by mouth 2 (two) times daily.      Marland Kitchen HYDROCHLOROTHIAZIDE 25 MG PO TABS Oral Take 12.5 mg by mouth daily.      . INSULIN GLARGINE 100 UNIT/ML [place] SOLN Subcutaneous Inject 25 Units into the skin at bedtime.      Marland Kitchen ONE-DAILY MULTI VITAMINS PO TABS Oral Take 1 tablet by mouth daily.    Marland Kitchen PIOGLITAZONE HCL 30 MG  PO TABS Oral Take 30 mg by mouth daily.      Marland Kitchen POTASSIUM CHLORIDE CRYS ER 10 MEQ PO TBCR Oral Take 10 mEq by mouth 2 (two) times daily.      BP 164/77  Pulse 72  Temp(Src) 98.4 F (36.9 C) (Oral)  Resp 18  SpO2 99%  Physical Exam  Constitutional: She is oriented to person, place, and time. She appears well-developed and well-nourished.  HENT:  Head: Normocephalic and atraumatic.  Eyes: Conjunctivae are normal.  Neck: Neck supple.  Cardiovascular: Normal rate and regular rhythm.   Pulmonary/Chest: Effort normal and breath sounds normal.  Abdominal: Soft. Bowel sounds are normal.  Musculoskeletal: Normal range of motion.       Legs:      There is obvious swelling to the medial posterior right thigh, no erythema or palpable cords.  Neurological: She is alert and oriented to person, place, and time.  Skin: Skin is warm and dry. No erythema.  Psychiatric: She has a normal mood and affect.    ED  Course  Procedures  D-Dimer 2.84. Findings and clinical impression discussed with patient. Will give Lovenox per protocol and plan for discharge home with instructions to return in the morning for venous Doppler studies of her right lower extremity. Patient agreeable with plan.  Labs Reviewed  CBC  DIFFERENTIAL  BASIC METABOLIC PANEL  D-DIMER, QUANTITATIVE   No results found.   No diagnosis found.    MDM  HPI/PE and clinical findings c/w 1. RLE pain (RLE pain, predominantly posterior calf where there is objective swelling but no erythema or palpable cord, very TTP to posterior calf, d-dimer 2.84, Lovenox given per protocol, patient meets all criteria for outpatient DVT protocol to return in a.m. for venous Doppler studies of her right lower extremity).   Medical screening examination/treatment/procedure(s) were performed by non-physician practitioner and as supervising physician I was immediately available for consultation/collaboration. Katy Apo, M.D.      Rhetta Mura Schorr, NP 04/24/11 Ruth III, MD 04/25/11 [phone]

## 2011-04-23 NOTE — ED Notes (Signed)
Lab called regarding pt being a diffu

## 2011-04-24 ENCOUNTER — Ambulatory Visit (HOSPITAL_COMMUNITY)
Admission: RE | Admit: 2011-04-24 | Discharge: 2011-04-24 | Disposition: A | Discharge status: Home / Self Care | Payer: Medicaid Other | Source: Ambulatory Visit | Attending: Emergency Medicine | Admitting: Emergency Medicine

## 2011-04-24 DIAGNOSIS — R52 Pain, unspecified: Secondary | ICD-10-CM

## 2011-04-24 DIAGNOSIS — M79609 Pain in unspecified limb: Secondary | ICD-10-CM

## 2011-04-24 NOTE — Progress Notes (Signed)
VASCULAR LAB PRELIMINARY  PRELIMINARY  PRELIMINARY  PRELIMINARY  Right lower extremity venous Doppler completed.    Preliminary report:  No DVT or SVT noted in the right lower extremity.  Sharion Dove Chauncey, 04/24/2011, 11:14 AM

## 2011-04-24 NOTE — Discharge Instructions (Signed)
You were evaluated in the emergency department tonight for your right lower extreme  pain. There is some noticeable swelling to the right posterior calf. That combined with your labwork tonight has caused [place] to be suspicious that syou may have a blood clot in your right lower extremity. We have given you one dose of Lovenox which is a blood thinner here in the emergency department tonight and you are to return in the morning at 7:4 am for your  venous doppler studies, which is a special ultrasound of your right lower leg the to evaluate for the presence of a clot. Please return to the emergency department if you should have shortness of breath, chest pain or any acute change in your symptoms. Otherwise follow up in the morning as discussed.

## 2011-11-17 ENCOUNTER — Other Ambulatory Visit (HOSPITAL_COMMUNITY): Payer: Self-pay | Admitting: Internal Medicine

## 2011-11-17 DIAGNOSIS — Z1231 Encounter for screening mammogram for malignant neoplasm of breast: Secondary | ICD-10-CM

## 2011-11-30 ENCOUNTER — Ambulatory Visit (HOSPITAL_COMMUNITY)
Admission: RE | Admit: 2011-11-30 | Discharge: 2011-11-30 | Disposition: A | Discharge status: Home / Self Care | Payer: Medicaid Other | Source: Ambulatory Visit | Attending: Internal Medicine | Admitting: Internal Medicine

## 2011-11-30 DIAGNOSIS — Z1231 Encounter for screening mammogram for malignant neoplasm of breast: Secondary | ICD-10-CM | POA: Insufficient documentation

## 2012-11-05 ENCOUNTER — Other Ambulatory Visit (HOSPITAL_COMMUNITY): Payer: Self-pay | Admitting: Internal Medicine

## 2012-11-05 DIAGNOSIS — Z1231 Encounter for screening mammogram for malignant neoplasm of breast: Secondary | ICD-10-CM

## 2012-12-03 ENCOUNTER — Ambulatory Visit (HOSPITAL_COMMUNITY)
Admission: RE | Admit: 2012-12-03 | Discharge: 2012-12-03 | Disposition: A | Discharge status: Home / Self Care | Payer: Medicaid Other | Source: Ambulatory Visit | Attending: Internal Medicine | Admitting: Internal Medicine

## 2012-12-03 DIAGNOSIS — Z1231 Encounter for screening mammogram for malignant neoplasm of breast: Secondary | ICD-10-CM | POA: Insufficient documentation

## 2013-04-22 ENCOUNTER — Encounter (HOSPITAL_COMMUNITY): Payer: Self-pay | Admitting: Emergency Medicine

## 2013-04-22 ENCOUNTER — Emergency Department (HOSPITAL_COMMUNITY)
Admission: EM | Admit: 2013-04-22 | Discharge: 2013-04-22 | Disposition: A | Discharge status: Home / Self Care | Payer: Medicaid Other | Attending: Emergency Medicine | Admitting: Emergency Medicine

## 2013-04-22 DIAGNOSIS — M2550 Pain in unspecified joint: Secondary | ICD-10-CM

## 2013-04-22 DIAGNOSIS — Z79899 Other long term (current) drug therapy: Secondary | ICD-10-CM | POA: Insufficient documentation

## 2013-04-22 DIAGNOSIS — J45909 Unspecified asthma, uncomplicated: Secondary | ICD-10-CM | POA: Insufficient documentation

## 2013-04-22 DIAGNOSIS — R6883 Chills (without fever): Secondary | ICD-10-CM | POA: Insufficient documentation

## 2013-04-22 DIAGNOSIS — E119 Type 2 diabetes mellitus without complications: Secondary | ICD-10-CM | POA: Insufficient documentation

## 2013-04-22 DIAGNOSIS — M79609 Pain in unspecified limb: Secondary | ICD-10-CM | POA: Insufficient documentation

## 2013-04-22 DIAGNOSIS — M7989 Other specified soft tissue disorders: Secondary | ICD-10-CM | POA: Insufficient documentation

## 2013-04-22 DIAGNOSIS — R609 Edema, unspecified: Secondary | ICD-10-CM | POA: Insufficient documentation

## 2013-04-22 DIAGNOSIS — M129 Arthropathy, unspecified: Secondary | ICD-10-CM | POA: Insufficient documentation

## 2013-04-22 DIAGNOSIS — I10 Essential (primary) hypertension: Secondary | ICD-10-CM | POA: Insufficient documentation

## 2013-04-22 DIAGNOSIS — Z7982 Long term (current) use of aspirin: Secondary | ICD-10-CM | POA: Insufficient documentation

## 2013-04-22 DIAGNOSIS — Z794 Long term (current) use of insulin: Secondary | ICD-10-CM | POA: Insufficient documentation

## 2013-04-22 LAB — BASIC METABOLIC PANEL
BUN: 15 mg/dL (ref 6–23)
CALCIUM: 9.5 mg/dL (ref 8.4–10.5)
CO2: 24 meq/L (ref 19–32)
CREATININE: 0.64 mg/dL (ref 0.50–1.10)
Chloride: 100 mEq/L (ref 96–112)
GFR calc Af Amer: >90 mL/min (ref >90)
Glucose, Bld: 129 mg/dL — ABNORMAL HIGH (ref 70–99)
Potassium: 3.9 mEq/L (ref 3.7–5.3)
SODIUM: 139 meq/L (ref 137–147)

## 2013-04-22 LAB — CBC WITH DIFFERENTIAL/PLATELET
BASOS ABS: 0 10*3/uL (ref 0.0–0.1)
BASOS PCT: 0 % (ref 0–1)
EOS ABS: 0 10*3/uL (ref 0.0–0.7)
EOS PCT: 0 % (ref 0–5)
HCT: 36.9 % (ref 36.0–46.0)
HEMOGLOBIN: 12.8 g/dL (ref 12.0–15.0)
Lymphocytes Relative: 25 % (ref 12–46)
Lymphs Abs: 2.4 10*3/uL (ref 0.7–4.0)
MCH: 31 pg (ref 26.0–34.0)
MCHC: 34.7 g/dL (ref 30.0–36.0)
MCV: 89.3 fL (ref 78.0–100.0)
MONO ABS: 0.6 10*3/uL (ref 0.1–1.0)
Monocytes Relative: 6 % (ref 3–12)
Neutro Abs: 6.9 10*3/uL (ref 1.7–7.7)
Neutrophils Relative %: 69 % (ref 43–77)
PLATELETS: 172 10*3/uL (ref 150–400)
RBC: 4.13 MIL/uL (ref 3.87–5.11)
RDW: 13.8 % (ref 11.5–15.5)
WBC: 10 10*3/uL (ref 4.0–10.5)

## 2013-04-22 MED ORDER — INDOMETHACIN 50 MG PO CAPS: 50.0000 mg | ORAL_CAPSULE | Freq: Two times a day (BID) | ORAL | Status: DC

## 2013-04-22 MED ORDER — HYDROCODONE-ACETAMINOPHEN 5-325 MG PO TABS: 1.0000 | ORAL_TABLET | Freq: Four times a day (QID) | ORAL | Status: DC | PRN

## 2013-04-22 MED ORDER — MORPHINE SULFATE 4 MG/ML IJ SOLN
4.0000 mg | Freq: Once | INTRAMUSCULAR | Status: AC
  Administered 2013-04-22: 4 mg via INTRAVENOUS
  Filled 2013-04-22: qty 1

## 2013-04-22 NOTE — ED Notes (Signed)
Provided pt nurse aid with the verbal information regarding pt discharge instructions.

## 2013-04-22 NOTE — ED Notes (Signed)
MD and this nurse at bedside with interpreter on phone reviewing discharge instructions.

## 2013-04-22 NOTE — Progress Notes (Signed)
*  PRELIMINARY RESULTS* Vascular Ultrasound Left lower extremity venous duplex has been completed.  Preliminary findings: No obvious evidence of DVT.   Landry Mellow, RDMS, RVT  04/22/2013, 12:09 PM

## 2013-04-22 NOTE — ED Notes (Signed)
IV team paged for pt.  

## 2013-04-22 NOTE — ED Notes (Signed)
Second RN at bedside for IV access.

## 2013-04-22 NOTE — ED Provider Notes (Signed)
CSN: [id number]     Arrival date & time 04/22/13  1006 History   First MD Initiated Contact with Patient 04/22/13 1055     Chief Complaint  Patient presents with  . Foot Pain     (Consider location/radiation/quality/duration/timing/severity/associated sxs/prior Treatment) Patient is a 63 y.o. female presenting with lower extremity pain. The history is provided by the patient. A language interpreter was used (phone interpreter ).  Foot Pain Associated symptoms include arthralgias, chills and myalgias. Pertinent negatives include no abdominal pain, chest pain, coughing, fever, nausea or vomiting.  This is a 63yo [place] speaking female w/ PMH DM, HTN, HLD, GERD, bilateral knee OA who presents with c/o L foot pain x last night. Patient notes increased leg swelling to her L leg since yesterday. She endorses chills and some mild nausea, denies fever, vomiting, abd pain, CP, SOB. No hx of DVT, known cancer, recent travel. She does have very sedentary lifestyle as she is unable to ambulated 2/2 knee OA and now uses a wheelchair at all times. She notes an injury to her L leg 2 months ago when she stubbed her toe, but no recent injury to this leg.   Past Medical History  Diagnosis Date  . Hypertension   . Asthma   . Diabetes mellitus   . Arthritis   . High cholesterol   . Morbid obesity    Past Surgical History  Procedure Laterality Date  . Tonsillectomy     History reviewed. No pertinent family history. History  Substance Use Topics  . Smoking status: Never Smoker   . Smokeless tobacco: Not on file  . Alcohol Use: No   OB History   Grav Para Term Preterm Abortions TAB SAB Ect Mult Living                 Review of Systems  Constitutional: Positive for chills. Negative for fever.  Respiratory: Negative for cough and shortness of breath.   Cardiovascular: Positive for leg swelling. Negative for chest pain.  Gastrointestinal: Negative for nausea, vomiting, abdominal pain and diarrhea.   Musculoskeletal: Positive for arthralgias and myalgias.  Neurological: Negative for light-headedness.  All other systems reviewed and are negative.      Allergies  Peanut-containing drug products  Home Medications   Current Outpatient Rx  Name  Route  Sig  Dispense  Refill  . aspirin 325 MG tablet   Oral   Take 325 mg by mouth daily.         . benazepril (LOTENSIN) 10 MG tablet   Oral   Take 10 mg by mouth daily.           . Cholecalciferol (VITAMIN D PO)   Oral   Take 1 tablet by mouth.         . Colesevelam HCl 3.75 G PACK   Oral   Take 1 each by mouth daily.         Marland Kitchen doxepin (SINEQUAN) 25 MG capsule   Oral   Take 25 mg by mouth at bedtime.           . famotidine (PEPCID) 20 MG tablet   Oral   Take 20 mg by mouth 2 (two) times daily.           Marland Kitchen glipiZIDE (GLUCOTROL XL) 10 MG 24 hr tablet   Oral   Take 10 mg by mouth 2 (two) times daily.           . hydrochlorothiazide (HYDRODIURIL) 25 MG  tablet   Oral   Take 12.5 mg by mouth daily.           . insulin glargine (LANTUS SOLOSTAR) 100 UNIT/ML injection   Subcutaneous   Inject 25 Units into the skin at bedtime.           . Multiple Vitamin (MULTIVITAMIN) tablet   Oral   Take 1 tablet by mouth daily.         . pioglitazone (ACTOS) 30 MG tablet   Oral   Take 30 mg by mouth daily.           . potassium chloride (K-DUR,KLOR-CON) 10 MEQ tablet   Oral   Take 10 mEq by mouth 2 (two) times daily.          BP 166/86  Pulse 91  Temp(Src) 98.1 F (36.7 C) (Oral)  Resp 24  SpO2 100% Physical Exam  Nursing note and vitals reviewed. Constitutional: She is oriented to person, place, and time. She appears well-developed and well-nourished.  Appears to be in pain  HENT:  Head: Normocephalic and atraumatic.  Mouth/Throat: Oropharynx is clear and moist.  Eyes: EOM are normal. Pupils are equal, round, and reactive to light.  Cardiovascular: Normal rate, regular rhythm and normal  heart sounds.   Pulmonary/Chest: Effort normal and breath sounds normal. No respiratory distress.  Abdominal: Soft. Bowel sounds are normal. She exhibits no distension. There is no tenderness.  Musculoskeletal: She exhibits edema.  1+ pitting BLE edema; Erythema to dorsal L foot mostly over L MTP joint; TTP over dorsal and plantar surfaces of L foot, L calf and L posterior upper leg; no palpable cords; limited ROM to L ankle, toes and knee 2/2 pain  Neurological: She is alert and oriented to person, place, and time. No cranial nerve deficit.  Skin: Skin is warm and dry. There is erythema.    ED Course  Procedures (including critical care time) Labs Review Labs Reviewed  CBC WITH DIFFERENTIAL  BASIC METABOLIC PANEL   Imaging Review No results found.   EKG Interpretation None      MDM   This is a 62yo spanish speaking F with PMH DM, HTN, HLD, OA who presents w/ L leg pain/swelling/redness with subjective chills that most likely represents early cellulitis vs gout. However, given patient's sedentary lifestyle I will r/o DVT with lower extremity duplex. CBC, BMP pending. Will give morphine for pain management.  2:52 PM Patient's LLE duplex negative for DVT. Dopplers reveal strong arterial flow in DP and PT. CBC and BMP unrevealing. Upon re-examination, most of patient's pain and erythema is located in L MTP joint with some extension into the rest of her toes of L foot and L ankle. The pattern of tenderness and erythema is atypical for cellulitis. I suspect patient has an acute gout flare. She does not have documented hx of gout, but her s/s are consistent with gout flare. I do not suspect septic joint as patient is well appearing and has stable vital signs. She remains afebrile and does not have a leukocytosis. I will discharge patient with indomethacin 50mg  TID with close follow up with PCP. She was instructed to return to ED or go to her PCP's office immediately with worsening/progressing  symptoms.       Rebecca Eaton, MD 04/22/13 1500

## 2013-04-22 NOTE — ED Notes (Signed)
MD student at bedside.  Interpreter phone in use with patient.  Pt presents with Left leg pain that started last night, radiating from the tips of the toes to the thighs.  Pt reports chills, no fever, nausea but no vomiting.  Pt is a high fall risk.  Pt states she is unable to walk due to "no cartilage left in the knees", pt uses a wheelchair at home.

## 2013-04-22 NOTE — ED Notes (Signed)
Pt arrived by [facility] for increase in pain and swelling to left foot for several days. Already has ulcer on right foot.

## 2013-04-22 NOTE — ED Provider Notes (Signed)
I saw and evaluated the patient, reviewed the resident's note and I agree with the findings and plan.   EKG Interpretation None      Well appearing. Likely inflammatory arthritis. Dc home  Hoy Morn, MD 04/22/13 [phone]

## 2013-04-22 NOTE — Discharge Instructions (Signed)
Gout °Gout is an inflammatory arthritis caused by a buildup of uric acid crystals in the joints. Uric acid is a chemical that is normally present in the blood. When the level of uric acid in the blood is too high it can form crystals that deposit in your joints and tissues. This causes joint redness, soreness, and swelling (inflammation). Repeat attacks are common. Over time, uric acid crystals can form into masses (tophi) near a joint, destroying bone and causing disfigurement. Gout is treatable and often preventable. °CAUSES  °The disease begins with elevated levels of uric acid in the blood. Uric acid is produced by your body when it breaks down a naturally found substance called purines. Certain foods you eat, such as meats and fish, contain high amounts of purines. Causes of an elevated uric acid level include: °· Being passed down from parent to child (heredity). °· Diseases that cause increased uric acid production (such as obesity, psoriasis, and certain cancers). °· Excessive alcohol use. °· Diet, especially diets rich in meat and seafood. °· Medicines, including certain cancer-fighting medicines (chemotherapy), water pills (diuretics), and aspirin. °· Chronic kidney disease. The kidneys are no longer able to remove uric acid well. °· Problems with metabolism. °Conditions strongly associated with gout include: °· Obesity. °· High blood pressure. °· High cholesterol. °· Diabetes. °Not everyone with elevated uric acid levels gets gout. It is not understood why some people get gout and others do not. Surgery, joint injury, and eating too much of certain foods are some of the factors that can lead to gout attacks. °SYMPTOMS  °· An attack of gout comes on quickly. It causes intense pain with redness, swelling, and warmth in a joint. °· Fever can occur. °· Often, only one joint is involved. Certain joints are more commonly involved: °· Base of the big toe. °· Knee. °· Ankle. °· Wrist. °· Finger. °Without  treatment, an attack usually goes away in a few days to weeks. Between attacks, you usually will not have symptoms, which is different from many other forms of arthritis. °DIAGNOSIS  °Your caregiver will suspect gout based on your symptoms and exam. In some cases, tests may be recommended. The tests may include: °· Blood tests. °· Urine tests. °· X-rays. °· Joint fluid exam. This exam requires a needle to remove fluid from the joint (arthrocentesis). Using a microscope, gout is confirmed when uric acid crystals are seen in the joint fluid. °TREATMENT  °There are two phases to gout treatment: treating the sudden onset (acute) attack and preventing attacks (prophylaxis). °· Treatment of an Acute Attack. °· Medicines are used. These include anti-inflammatory medicines or steroid medicines. °· An injection of steroid medicine into the affected joint is sometimes necessary. °· The painful joint is rested. Movement can worsen the arthritis. °· You may use warm or cold treatments on painful joints, depending which works best for you. °· Treatment to Prevent Attacks. °· If you suffer from frequent gout attacks, your caregiver may advise preventive medicine. These medicines are started after the acute attack subsides. These medicines either help your kidneys eliminate uric acid from your body or decrease your uric acid production. You may need to stay on these medicines for a very long time. °· The early phase of treatment with preventive medicine can be associated with an increase in acute gout attacks. For this reason, during the first few months of treatment, your caregiver may also advise you to take medicines usually used for acute gout treatment. Be sure you   understand your caregiver's directions. Your caregiver may make several adjustments to your medicine dose before these medicines are effective.  Discuss dietary treatment with your caregiver or dietitian. Alcohol and drinks high in sugar and fructose and foods  such as meat, poultry, and seafood can increase uric acid levels. Your caregiver or dietician can advise you on drinks and foods that should be limited. HOME CARE INSTRUCTIONS   Do not take aspirin to relieve pain. This raises uric acid levels.  Only take over-the-counter or prescription medicines for pain, discomfort, or fever as directed by your caregiver.  Rest the joint as much as possible. When in bed, keep sheets and blankets off painful areas.  Keep the affected joint raised (elevated).  Apply warm or cold treatments to painful joints. Use of warm or cold treatments depends on which works best for you.  Use crutches if the painful joint is in your leg.  Drink enough fluids to keep your urine clear or pale yellow. This helps your body get rid of uric acid. Limit alcohol, sugary drinks, and fructose drinks.  Follow your dietary instructions. Pay careful attention to the amount of protein you eat. Your daily diet should emphasize fruits, vegetables, whole grains, and fat-free or low-fat milk products. Discuss the use of coffee, vitamin C, and cherries with your caregiver or dietician. These may be helpful in lowering uric acid levels.  Maintain a healthy body weight. SEEK MEDICAL CARE IF:   You develop diarrhea, vomiting, or any side effects from medicines.  You do not feel better in 24 hours, or you are getting worse. SEEK IMMEDIATE MEDICAL CARE IF:   Your joint becomes suddenly more tender, and you have chills or a fever. MAKE SURE YOU:   Understand these instructions.  Will watch your condition.  Will get help right away if you are not doing well or get worse. Document Released: 01/21/2000 Document Revised: 05/20/2012 Document Reviewed: 09/06/2011 [facility] Patient Information 2014 [place]. Gota  (Gout)  La gota es una artritis inflamatoria originada por la acumulacin de cristales de cido rico en las articulaciones. El cido rico es una sustancia qumica que  normalmente se encuentra en la [place]. Cuando los niveles de cido rico en la sangre son muy elevados, pueden formarse cristales que se depositan en las articulaciones y los tejidos. Esto causa irritacin, dolor e hinchazn (inflamacin). La repeticin de los ataques es frecuente. Con el tiempo, los cristales de cido rico pueden formar [employer] (tofos) cerca de Insurance claims handler, destruyen el [place] y provocan una deformidad La gota es una enfermedad que puede tratarse y con frecuencia puede prevenirse. CAUSAS  La enfermedad comienza con niveles altos de cido rico en la sangre. El organismo produce cido rico cuando metaboliza una sustancia que se encuentra en [place] natural, denominada purina. Ciertos alimentos, como carnes y pescado, contienen grandes cantidades de purinas. Las causas de cido rico elevado son:   Ser transmitida de padres a hijos (hereditaria).  Enfermedades que causan un aumento de la produccin de cido rico (como la obesidad, la psoriasis y ciertos tipos de Hotel manager).  Abuso en el consumo de bebidas alcohlicas.  La dieta, especialmente las [employer] se consume [place] carne y frutos de mar.  Ciertos medicamentos, como los que combaten el cncer (quimioterapia), los diurticos y la aspirina.  Enfermedades renales crnicas. Los riones no pueden eliminar bien el cido rico.  Problemas con el metabolismo. Las enfermedades fuertemente asociadas a la gota son:   Arman Bogus.  Hipertensin arterial.  Colesterol elevado.  Diabetes. No todas las personas con niveles elevados de cido rico sufren gota. No se comprende an por qu algunas personas padecen gota y otras no. [place] Pine Springscirugas, lesiones en una articulacin y el consumo excesivo de ciertos alimentos son algunos de los factores que pueden provocar ataques de [place].  SNTOMAS   Un ataque de gota puede comenzar rpidamente. Causa un dolor intenso, con irritacin, hinchazn y calor en una articulacin.  Puede haber  fiebre.  Generalmente slo una articulacin es Althaafectada. Ciertas articulaciones se ven implicadas con ms frecuencia.  La base del dedo gordo del pie.  La rodilla.  El tobillo.  Webb LawsLa mueca.  Un dedo. Sin tratamiento, un ataque por lo general desaparece en unos pocos das o semanas. Entre un ataque y otro, por lo general no hay sntomas, lo cual es diferente de Atkinsportmuchas otras formas de artritis.  DIAGNSTICO  El profesional reunir la informacin basndose en los sntomas y el examen fsico. En algunos casos indicar estudios. Estos estudios pueden ser:   Anlisis de Abandasangre.  Anlisis de Comor[place]orina.  Radiografas.  Anlisis de los fluidos de Nurse, learning disabilityla articulacin. En este estudio se necesita una aguja para extraer lquido de la articulacin (artrocentesis). Con el uso del microscopio, se confirma la gota cuando se observan los cristales de cido rico en el lquido de Nurse, learning disabilityla articulacin. TRATAMIENTO  ONEOKHay dos fases en el tratamiento de la gota: el tratamiento del ataque de aparicin repentina (agudo) y la prevencin de los ataques (profilaxis).   Tratamiento de un ataque agudo.  Se utilizan medicamentos. Se indican antiinflamatorios o corticoides.  En algunos casos es necesario inyectar un corticoide en la articulacin afectada.  La articulacin dolorosa se pone en reposo. El movimiento puede empeorar la artritis.  Puede utilizar tanto tratamientos con calor o con fro para Scientific laboratory technicianaliviar el dolor en las articulaciones, segn lo que le haga mejor.  Tratamiento para prevenir ataques.  Si sufre de ataques de gota frecuentes, el mdico puede recomendarle medicamentos preventivos. Estos medicamentos se administran despus de que el ataque agudo Mundenmejora. Estos medicamentos ayudan a los riones a Landeliminar el cido rico del organismo o a Conservator, museum/gallerydisminuir la produccin de cido rico. Es posible que deba Chemical engineerutilizar estos medicamentos durante un largo Caledoniatiempo.  La primera fase del tratamiento preventiva puede asociarse  con un aumento de los ataques agudos de [place]. Por esta razn, durante los primeros meses de Calum[place] Citytratamiento, Oregonsu mdico puede tambin aconsejarle que tome medicamentos que habitualmente se Lao People's Democratic Republicutilizan para el tratamiento de la gota aguda. Asegrese de comprender todas las indicaciones del mdico. El mdico podr hacer varios ajustes a la dosis del medicamento antes de que comiencen a ser Geologist, engineeringefectivos.  Comente el tratamiento diettico con su mdico o nutricionista. El alcohol y las bebidas que contienen gran cantidad de International aid/development workerazcar y fructosa y los alimentos como la carne, el pollo y los frutos de mar pueden aumentar los niveles de cido rico. El mdico o el nutricionista podr Comcastaconsejarlo sobre las bebidas y los alimentos que debe limitar. INSTRUCCIONES PARA EL CUIDADO EN EL HOGAR   No tome aspirina para el dolor. Esto eleva los niveles de cido rico.  Slo tome medicamentos de venta libre o prescriptos para Primary school teachercalmar el dolor, las molestias o Publishing copybajar la fiebre segn las indicaciones de su mdico.  Sports administratorHaga reposo todo el tiempo que pueda. Cuando se encuentre en la cama, mantenga las sbanas y [address] Stmantas alejadas de las articulaciones doloridas.  Mantenga la articulacin afectada levantada (elevada).  Aplique compresas fras  o calientes sobre las articulaciones doloridas. el uso de compresas calientes o fras depende de lo que [employer] resulte a usted.  Utilice muletas si la articulacin que le duele es de la pierna.  Debe ingerir gran cantidad de lquido para mantener la orina de tono claro o color amarillo plido. Esto ayudar a que el organismo elimine el cido rico. Limite el consumo de alcohol, bebidas azucaradas y que contengan fructosa.  Siga las indicaciones con respecto a la dieta. Preste especial atencin a la cantidad de protenas que consume. En la dieta diaria debe enfatizar el consumo de frutas, vegetales, granos enteros y productos lcteos descremados. Comente con su mdico o nutricionista acerca del  consumo de caf, vitamina C o cerezas. Estos pueden ayudar a [employer] de cido rico.  Mantenga un peso corporal adecuado. SOLICITE ATENCIN MDICA SI:   Tiene diarrea, vmitos o algn efecto secundario provocado por los medicamentos.  No se siente mejor en 24 horas, o empeora. SOLICITE ATENCIN MDICA DE INMEDIATO SI:   Las articulaciones le duelen ms de manera repentina, tiene escalofros o fiebre. ASEGRESE DE QUE:   Comprende estas instrucciones.  Controlar su enfermedad.  Solicitar ayuda de inmediato si no mejora o si empeora. Document Released: 11/02/2004 Document Revised: 05/20/2012 [facility] Patient Information 2014 [place], [place].

## 2013-04-22 NOTE — ED Notes (Signed)
Vascular okay to take patient to ultrasound.

## 2013-04-22 NOTE — ED Notes (Signed)
Phlebotomy paged for blood draw

## 2013-11-03 ENCOUNTER — Other Ambulatory Visit (HOSPITAL_COMMUNITY): Payer: Self-pay | Admitting: Internal Medicine

## 2013-11-03 DIAGNOSIS — Z1231 Encounter for screening mammogram for malignant neoplasm of breast: Secondary | ICD-10-CM

## 2013-12-05 ENCOUNTER — Ambulatory Visit (HOSPITAL_COMMUNITY): Payer: Medicaid Other

## 2013-12-23 ENCOUNTER — Ambulatory Visit (HOSPITAL_COMMUNITY)
Admission: RE | Admit: 2013-12-23 | Discharge: 2013-12-23 | Disposition: A | Discharge status: Home / Self Care | Payer: Medicare Other | Source: Ambulatory Visit | Attending: Internal Medicine | Admitting: Internal Medicine

## 2013-12-23 DIAGNOSIS — Z1231 Encounter for screening mammogram for malignant neoplasm of breast: Secondary | ICD-10-CM

## 2017-12-17 ENCOUNTER — Emergency Department (HOSPITAL_COMMUNITY): Payer: Medicare Other

## 2017-12-17 ENCOUNTER — Inpatient Hospital Stay (HOSPITAL_COMMUNITY)
Admission: EM | Admit: 2017-12-17 | Discharge: 2017-12-24 | DRG: 682 | Disposition: A | Discharge status: Home Health | Payer: Medicare Other | Attending: Internal Medicine | Admitting: Internal Medicine

## 2017-12-17 ENCOUNTER — Encounter (HOSPITAL_COMMUNITY): Payer: Self-pay

## 2017-12-17 ENCOUNTER — Other Ambulatory Visit: Payer: Self-pay

## 2017-12-17 DIAGNOSIS — M79671 Pain in right foot: Secondary | ICD-10-CM | POA: Diagnosis present

## 2017-12-17 DIAGNOSIS — Z794 Long term (current) use of insulin: Secondary | ICD-10-CM | POA: Diagnosis not present

## 2017-12-17 DIAGNOSIS — E119 Type 2 diabetes mellitus without complications: Secondary | ICD-10-CM | POA: Diagnosis not present

## 2017-12-17 DIAGNOSIS — E860 Dehydration: Secondary | ICD-10-CM | POA: Diagnosis present

## 2017-12-17 DIAGNOSIS — M109 Gout, unspecified: Secondary | ICD-10-CM | POA: Insufficient documentation

## 2017-12-17 DIAGNOSIS — E1165 Type 2 diabetes mellitus with hyperglycemia: Secondary | ICD-10-CM | POA: Diagnosis present

## 2017-12-17 DIAGNOSIS — R350 Frequency of micturition: Secondary | ICD-10-CM | POA: Diagnosis present

## 2017-12-17 DIAGNOSIS — N179 Acute kidney failure, unspecified: Secondary | ICD-10-CM | POA: Diagnosis present

## 2017-12-17 DIAGNOSIS — R748 Abnormal levels of other serum enzymes: Secondary | ICD-10-CM | POA: Diagnosis not present

## 2017-12-17 DIAGNOSIS — F329 Major depressive disorder, single episode, unspecified: Secondary | ICD-10-CM | POA: Diagnosis present

## 2017-12-17 DIAGNOSIS — M7989 Other specified soft tissue disorders: Secondary | ICD-10-CM | POA: Diagnosis not present

## 2017-12-17 DIAGNOSIS — I10 Essential (primary) hypertension: Secondary | ICD-10-CM | POA: Diagnosis present

## 2017-12-17 DIAGNOSIS — R600 Localized edema: Secondary | ICD-10-CM | POA: Diagnosis not present

## 2017-12-17 DIAGNOSIS — R7989 Other specified abnormal findings of blood chemistry: Secondary | ICD-10-CM

## 2017-12-17 DIAGNOSIS — E1129 Type 2 diabetes mellitus with other diabetic kidney complication: Secondary | ICD-10-CM

## 2017-12-17 DIAGNOSIS — R945 Abnormal results of liver function studies: Secondary | ICD-10-CM

## 2017-12-17 DIAGNOSIS — E785 Hyperlipidemia, unspecified: Secondary | ICD-10-CM | POA: Diagnosis present

## 2017-12-17 DIAGNOSIS — E1140 Type 2 diabetes mellitus with diabetic neuropathy, unspecified: Secondary | ICD-10-CM | POA: Diagnosis present

## 2017-12-17 DIAGNOSIS — Z91010 Allergy to peanuts: Secondary | ICD-10-CM

## 2017-12-17 DIAGNOSIS — F200 Paranoid schizophrenia: Secondary | ICD-10-CM | POA: Diagnosis present

## 2017-12-17 DIAGNOSIS — Z23 Encounter for immunization: Secondary | ICD-10-CM | POA: Diagnosis not present

## 2017-12-17 DIAGNOSIS — K7581 Nonalcoholic steatohepatitis (NASH): Secondary | ICD-10-CM | POA: Diagnosis present

## 2017-12-17 DIAGNOSIS — T43505A Adverse effect of unspecified antipsychotics and neuroleptics, initial encounter: Secondary | ICD-10-CM | POA: Diagnosis not present

## 2017-12-17 DIAGNOSIS — J189 Pneumonia, unspecified organism: Secondary | ICD-10-CM

## 2017-12-17 DIAGNOSIS — R911 Solitary pulmonary nodule: Secondary | ICD-10-CM | POA: Diagnosis present

## 2017-12-17 DIAGNOSIS — Z6841 Body Mass Index (BMI) 40.0 and over, adult: Secondary | ICD-10-CM | POA: Diagnosis not present

## 2017-12-17 DIAGNOSIS — E669 Obesity, unspecified: Secondary | ICD-10-CM | POA: Diagnosis present

## 2017-12-17 DIAGNOSIS — Z79899 Other long term (current) drug therapy: Secondary | ICD-10-CM

## 2017-12-17 HISTORY — DX: Gout, unspecified: M10.9

## 2017-12-17 HISTORY — DX: Acute kidney failure, unspecified: N17.9

## 2017-12-17 HISTORY — DX: Depression, unspecified: F32.A

## 2017-12-17 HISTORY — DX: Pneumonia, unspecified organism: J18.9

## 2017-12-17 HISTORY — DX: Major depressive disorder, single episode, unspecified: F32.9

## 2017-12-17 HISTORY — DX: Schizophrenia, unspecified: F20.9

## 2017-12-17 LAB — BASIC METABOLIC PANEL
ANION GAP: 9 (ref 5–15)
BUN: 63 mg/dL — ABNORMAL HIGH (ref 8–23)
CHLORIDE: 107 mmol/L (ref 98–111)
CO2: 19 mmol/L — AB (ref 22–32)
Calcium: 8.3 mg/dL — ABNORMAL LOW (ref 8.9–10.3)
Creatinine, Ser: 2.32 mg/dL — ABNORMAL HIGH (ref 0.44–1.00)
GFR calc non Af Amer: 20 mL/min — ABNORMAL LOW (ref >60)
GFR, EST AFRICAN AMERICAN: 24 mL/min — AB (ref >60)
Glucose, Bld: 301 mg/dL — ABNORMAL HIGH (ref 70–99)
POTASSIUM: 3.9 mmol/L (ref 3.5–5.1)
Sodium: 135 mmol/L (ref 135–145)

## 2017-12-17 LAB — CBC WITH DIFFERENTIAL/PLATELET
Basophils Absolute: 0 10*3/uL (ref 0.0–0.1)
Basophils Relative: 1 %
EOS PCT: 5 %
Eosinophils Absolute: 0.2 10*3/uL (ref 0.0–0.5)
HEMATOCRIT: 32.2 % — AB (ref 36.0–46.0)
HEMOGLOBIN: 10.2 g/dL — AB (ref 12.0–15.0)
Lymphocytes Relative: 17 %
Lymphs Abs: 0.7 10*3/uL (ref 0.7–4.0)
MCH: 29.7 pg (ref 26.0–34.0)
MCHC: 31.7 g/dL (ref 30.0–36.0)
MCV: 93.6 fL (ref 80.0–100.0)
MONO ABS: 0.3 10*3/uL (ref 0.1–1.0)
MONOS PCT: 6 %
NEUTROS ABS: 3 10*3/uL (ref 1.7–7.7)
Neutrophils Relative %: 68 %
Other: 3 %
Platelets: 146 10*3/uL — ABNORMAL LOW (ref 150–400)
RBC: 3.44 MIL/uL — ABNORMAL LOW (ref 3.87–5.11)
RDW: 15.1 % (ref 11.5–15.5)
WBC: 4.4 10*3/uL (ref 4.0–10.5)
nRBC: 0 % (ref 0.0–0.2)
nRBC: 0 /100 WBC

## 2017-12-17 LAB — CBG MONITORING, ED
GLUCOSE-CAPILLARY: 347 mg/dL — AB (ref 70–99)
GLUCOSE-CAPILLARY: 227 mg/dL — AB (ref 70–99)

## 2017-12-17 LAB — COMPREHENSIVE METABOLIC PANEL
ALK PHOS: 174 U/L — AB (ref 38–126)
ALT: 84 U/L — ABNORMAL HIGH (ref 0–44)
AST: 57 U/L — ABNORMAL HIGH (ref 15–41)
Albumin: 3 g/dL — ABNORMAL LOW (ref 3.5–5.0)
Anion gap: 10 (ref 5–15)
BILIRUBIN TOTAL: 0.8 mg/dL (ref 0.3–1.2)
BUN: 62 mg/dL — ABNORMAL HIGH (ref 8–23)
CALCIUM: 8.7 mg/dL — AB (ref 8.9–10.3)
CO2: 19 mmol/L — AB (ref 22–32)
CREATININE: 2.43 mg/dL — AB (ref 0.44–1.00)
Chloride: 103 mmol/L (ref 98–111)
GFR, EST AFRICAN AMERICAN: 22 mL/min — AB (ref >60)
GFR, EST NON AFRICAN AMERICAN: 19 mL/min — AB (ref >60)
Glucose, Bld: 384 mg/dL — ABNORMAL HIGH (ref 70–99)
Potassium: 3.9 mmol/L (ref 3.5–5.1)
Sodium: 132 mmol/L — ABNORMAL LOW (ref 135–145)
TOTAL PROTEIN: 6.7 g/dL (ref 6.5–8.1)

## 2017-12-17 LAB — I-STAT CG4 LACTIC ACID, ED: Lactic Acid, Venous: 1.88 mmol/L (ref 0.5–1.9)

## 2017-12-17 LAB — I-STAT VENOUS BLOOD GAS, ED
Acid-base deficit: 6 mmol/L — ABNORMAL HIGH (ref 0.0–2.0)
BICARBONATE: 19.7 mmol/L — AB (ref 20.0–28.0)
O2 SAT: 40 %
TCO2: 21 mmol/L — AB (ref 22–32)
pCO2, Ven: 37.2 mmHg — ABNORMAL LOW (ref 44.0–60.0)
pH, Ven: 7.331 (ref 7.250–7.430)
pO2, Ven: 24 mmHg — CL (ref 32.0–45.0)

## 2017-12-17 LAB — GLUCOSE, CAPILLARY: GLUCOSE-CAPILLARY: 309 mg/dL — AB (ref 70–99)

## 2017-12-17 LAB — LIPASE, BLOOD: LIPASE: 54 U/L — AB (ref 11–51)

## 2017-12-17 MED ORDER — SODIUM CHLORIDE 0.9 % IV SOLN
500.0000 mg | Freq: Once | INTRAVENOUS | Status: AC
  Administered 2017-12-17: 500 mg via INTRAVENOUS
  Filled 2017-12-17: qty 500

## 2017-12-17 MED ORDER — INSULIN ASPART 100 UNIT/ML ~~LOC~~ SOLN
5.0000 [IU] | Freq: Once | SUBCUTANEOUS | Status: AC
  Administered 2017-12-17: 5 [IU] via SUBCUTANEOUS
  Filled 2017-12-17: qty 1

## 2017-12-17 MED ORDER — SODIUM CHLORIDE 0.9 % IV SOLN
1.0000 g | INTRAVENOUS | Status: DC
  Administered 2017-12-18 – 2017-12-22 (×5): 1 g via INTRAVENOUS
  Filled 2017-12-17 (×5): qty 10

## 2017-12-17 MED ORDER — INSULIN GLARGINE 100 UNIT/ML ~~LOC~~ SOLN
15.0000 [IU] | Freq: Every day | SUBCUTANEOUS | Status: DC
  Administered 2017-12-17 – 2017-12-18 (×2): 15 [IU] via SUBCUTANEOUS
  Filled 2017-12-17 (×3): qty 0.15

## 2017-12-17 MED ORDER — AZITHROMYCIN 250 MG PO TABS
500.0000 mg | ORAL_TABLET | ORAL | Status: DC
  Administered 2017-12-18 – 2017-12-22 (×5): 500 mg via ORAL
  Filled 2017-12-17 (×5): qty 2

## 2017-12-17 MED ORDER — ACETAMINOPHEN 500 MG PO TABS
1000.0000 mg | ORAL_TABLET | Freq: Once | ORAL | Status: AC
  Administered 2017-12-17: 1000 mg via ORAL
  Filled 2017-12-17: qty 2

## 2017-12-17 MED ORDER — INSULIN ASPART 100 UNIT/ML ~~LOC~~ SOLN
0.0000 [IU] | Freq: Three times a day (TID) | SUBCUTANEOUS | Status: DC
  Administered 2017-12-18 (×2): 3 [IU] via SUBCUTANEOUS
  Administered 2017-12-18 – 2017-12-19 (×2): 2 [IU] via SUBCUTANEOUS
  Administered 2017-12-19: 3 [IU] via SUBCUTANEOUS
  Administered 2017-12-20 (×2): 2 [IU] via SUBCUTANEOUS
  Administered 2017-12-20: 5 [IU] via SUBCUTANEOUS
  Administered 2017-12-21 (×2): 2 [IU] via SUBCUTANEOUS
  Administered 2017-12-21 – 2017-12-22 (×2): 5 [IU] via SUBCUTANEOUS
  Administered 2017-12-22: 3 [IU] via SUBCUTANEOUS
  Administered 2017-12-22 – 2017-12-23 (×2): 2 [IU] via SUBCUTANEOUS
  Administered 2017-12-23 (×2): 3 [IU] via SUBCUTANEOUS
  Administered 2017-12-24 (×2): 2 [IU] via SUBCUTANEOUS

## 2017-12-17 MED ORDER — ENOXAPARIN SODIUM 30 MG/0.3ML ~~LOC~~ SOLN
30.0000 mg | Freq: Every day | SUBCUTANEOUS | Status: DC
  Administered 2017-12-18: 30 mg via SUBCUTANEOUS
  Filled 2017-12-17: qty 0.3

## 2017-12-17 MED ORDER — SODIUM CHLORIDE 0.9 % IV SOLN
INTRAVENOUS | Status: DC
  Administered 2017-12-17 – 2017-12-19 (×5): via INTRAVENOUS

## 2017-12-17 MED ORDER — SODIUM CHLORIDE 0.9 % IV BOLUS
1000.0000 mL | Freq: Once | INTRAVENOUS | Status: AC
  Administered 2017-12-17: 1000 mL via INTRAVENOUS

## 2017-12-17 MED ORDER — SODIUM CHLORIDE 0.9 % IV SOLN
1.0000 g | Freq: Once | INTRAVENOUS | Status: AC
  Administered 2017-12-17: 1 g via INTRAVENOUS
  Filled 2017-12-17: qty 10

## 2017-12-17 MED ORDER — ACETAMINOPHEN 325 MG PO TABS
650.0000 mg | ORAL_TABLET | Freq: Four times a day (QID) | ORAL | Status: DC | PRN
  Administered 2017-12-17 – 2017-12-24 (×9): 650 mg via ORAL
  Filled 2017-12-17 (×10): qty 2

## 2017-12-17 NOTE — ED Triage Notes (Signed)
Per [facility] pt has had polyuria for a few days and has a CBG of 441. Pt is primarily wheelchair bound at home per EMS.

## 2017-12-17 NOTE — ED Notes (Signed)
Un ble to draw blood cultures .

## 2017-12-17 NOTE — ED Notes (Signed)
Patient transported to X-ray 

## 2017-12-17 NOTE — Progress Notes (Signed)
[id number] received patient from ED. Patient is Spanish speaking, per daughter understands more English than she can speak.  Patient complains of headache, stomachache, and pain with coughing.  Told nurse has had loose stool for two weeks, none today. Right AC placed in ED was removed but area red, placed ice.  Will continue to monitor.

## 2017-12-17 NOTE — H&P (Signed)
History and Physical    Heather Petty PFX:[id number] DOB: 03/23/1950 DOA: 12/17/2017  PCP: [name], MD  Patient coming from: Home  I have personally briefly reviewed patient's old medical records in [place]  Chief Complaint: Hyperglycemia  HPI: Heather Petty is a 67 y.o. female with medical history significant of DM2, HTN, HLD, gout.  Patient presents to the ED for evaluation of generalized fatigue, subjective fevers, hyperglycemia.  Patient reports polyuria for past several days.  CNA at home checked BGLs found them in the 400s which is unusual for patient whos BGLs apparently usually run 60-80.  As a result EMS contacted to transport patient to ED.  Associated dysuria with increased urinary frequency per patient.  Has gouty R foot pain as well.  No abd pain, N/V CP.  Does have cough.   ED Course: CXR appears to show basilar PNA though CT just shows a 60m R pulm nodule at lung base.  Patient started on rocephin / azithromycin, UA still pending.   Review of Systems: As per HPI otherwise 10 point review of systems negative.   Past Medical History:  Diagnosis Date  . Arthritis   . Asthma   . Diabetes mellitus   . Gout   . High cholesterol   . Hypertension   . Morbid obesity ([place]     Past Surgical History:  Procedure Laterality Date  . TONSILLECTOMY       reports that she has never smoked. She does not have any smokeless tobacco history on file. She reports that she does not drink alcohol or use drugs.  Allergies  Allergen Reactions  . Peanut-Containing Drug Products Other (See Comments)    Questionable?? May have outgrown it??    No family history on file.  No sick contacts in household.   Prior to Admission medications   Medication Sig Start Date End Date Taking? Authorizing Provider  acetaminophen (TYLENOL) 325 MG tablet Take 650 mg by mouth every 6 (six) hours as needed (for pain).    Yes [provider]  benazepril (LOTENSIN)  10 MG tablet Take 10 mg by mouth daily.      [provider]  doxepin (SINEQUAN) 25 MG capsule Take 25 mg by mouth at bedtime.      [provider]  famotidine (PEPCID) 20 MG tablet Take 20 mg by mouth 2 (two) times daily.      [provider]  glipiZIDE (GLUCOTROL XL) 10 MG 24 hr tablet Take 10 mg by mouth 2 (two) times daily.      [provider]  hydrochlorothiazide (MICROZIDE) 12.5 MG capsule Take 12.5 mg by mouth daily. 12/03/17   [provider]  HYDROcodone-acetaminophen (NORCO/VICODIN) 5-325 MG per tablet Take 1 tablet by mouth every 6 (six) hours as needed for moderate pain. 04/22/13   CDixon Boos MD  indomethacin (INDOCIN) 50 MG capsule Take 1 capsule (50 mg total) by mouth 2 (two) times daily with a meal. Patient not taking: Reported on 12/17/2017 04/22/13   CDixon Boos MD  insulin glargine (LANTUS SOLOSTAR) 100 UNIT/ML injection Inject 25 Units into the skin at bedtime.     [provider]  LANTUS SOLOSTAR 100 UNIT/ML Solostar Pen Inject 25 Units into the skin at bedtime. 11/16/17   [provider]  Multiple Vitamin (MULTIVITAMIN) tablet Take 1 tablet by mouth daily.    [provider]  pioglitazone (ACTOS) 30 MG tablet Take 30 mg by mouth daily.  [provider]  potassium chloride (K-DUR,KLOR-CON) 10 MEQ tablet Take 10 mEq by mouth 2 (two) times daily.    [provider]    Physical Exam: Vitals:   12/17/17 1704 12/17/17 1706 12/17/17 1714 12/17/17 1855  BP:   113/79 (!) 116/50  Pulse:   84 82  Resp:   20 (!) 22  Temp:   97.8 F (36.6 C)   TempSrc:   Oral   SpO2: 100%  99% 100%  Weight:  (!) 136.1 kg    Height:  5' 3"  (1.6 m)      Constitutional: NAD, calm, comfortable Eyes: PERRL, lids and conjunctivae normal ENMT: Mucous membranes are moist. Posterior pharynx clear of any exudate or lesions.Normal dentition.  Neck: normal, supple, no masses, no  thyromegaly Respiratory: persistent dry sounding cough Cardiovascular: Regular rate and rhythm, no murmurs / rubs / gallops. No extremity edema. 2+ pedal pulses. No carotid bruits.  Abdomen: no tenderness, no masses palpated. No hepatosplenomegaly. Bowel sounds positive.  Musculoskeletal: no clubbing / cyanosis. No joint deformity upper and lower extremities. Good ROM, no contractures. Normal muscle tone.  Skin: no rashes, lesions, ulcers. No induration Neurologic: CN 2-12 grossly intact. Sensation intact, DTR normal. Strength 5/5 in all 4.  Psychiatric: Normal judgment and insight. Alert and oriented x 3. Normal mood.    Labs on Admission: I have personally reviewed following labs and imaging studies  CBC: Recent Labs  Lab 12/17/17 1729  WBC 4.4  NEUTROABS 3.0  HGB 10.2*  HCT 32.2*  MCV 93.6  PLT 409*   Basic Metabolic Panel: Recent Labs  Lab 12/17/17 1729  NA 132*  K 3.9  CL 103  CO2 19*  GLUCOSE 384*  BUN 62*  CREATININE 2.43*  CALCIUM 8.7*   GFR: Estimated Creatinine Clearance: 29.6 mL/min (A) (by C-G formula based on SCr of 2.43 mg/dL (H)). Liver Function Tests: Recent Labs  Lab 12/17/17 1729  AST 57*  ALT 84*  ALKPHOS 174*  BILITOT 0.8  PROT 6.7  ALBUMIN 3.0*   Recent Labs  Lab 12/17/17 2043  LIPASE 54*   No results for input(s): AMMONIA in the last 168 hours. Coagulation Profile: No results for input(s): INR, PROTIME in the last 168 hours. Cardiac Enzymes: No results for input(s): CKTOTAL, CKMB, CKMBINDEX, TROPONINI in the last 168 hours. BNP (last 3 results) No results for input(s): PROBNP in the last 8760 hours. HbA1C: No results for input(s): HGBA1C in the last 72 hours. CBG: Recent Labs  Lab 12/17/17 1708 12/17/17 2040  GLUCAP 347* 227*   Lipid Profile: No results for input(s): CHOL, HDL, LDLCALC, TRIG, CHOLHDL, LDLDIRECT in the last 72 hours. Thyroid Function Tests: No results for input(s): TSH, T4TOTAL, FREET4, T3FREE, THYROIDAB in  the last 72 hours. Anemia Panel: No results for input(s): VITAMINB12, FOLATE, FERRITIN, TIBC, IRON, RETICCTPCT in the last 72 hours. Urine analysis:    Component Value Date/Time   COLORURINE YELLOW 12/27/2010 1858   APPEARANCEUR CLOUDY (A) 12/27/2010 1858   LABSPEC 1.016 12/27/2010 1858   PHURINE 7.0 12/27/2010 1858   GLUCOSEU NEGATIVE 12/27/2010 1858   HGBUR NEGATIVE 12/27/2010 1858   BILIRUBINUR NEGATIVE 12/27/2010 1858   KETONESUR NEGATIVE 12/27/2010 1858   PROTEINUR NEGATIVE 12/27/2010 1858   UROBILINOGEN 0.2 12/27/2010 1858   NITRITE NEGATIVE 12/27/2010 1858   LEUKOCYTESUR NEGATIVE 12/27/2010 1858    Radiological Exams on Admission: Dg Chest 2 View  Result Date: 12/17/2017 CLINICAL DATA:  67 year old female with a history of polyuria EXAM: CHEST -  2 VIEW COMPARISON:  12/27/2010, 09/28/2006 FINDINGS: Cardiomediastinal silhouette unchanged. No evidence of central vascular congestion. No pneumothorax or pleural effusion. Low lung volumes. Lateral view demonstrates new ill-defined opacity at the posterior lung base. No displaced fracture. IMPRESSION: Lateral view is suggestive of basilar pneumonia. Electronically Signed   By: Corrie Mckusick D.O.   On: 12/17/2017 18:10   Ct Renal Stone Study  Result Date: 12/17/2017 CLINICAL DATA:  Flank pain and polyuria EXAM: CT ABDOMEN AND PELVIS WITHOUT CONTRAST TECHNIQUE: Multidetector CT imaging of the abdomen and pelvis was performed following the standard protocol without IV contrast. COMPARISON:  None. FINDINGS: LOWER CHEST: 6 mm nodule of the right lung base. HEPATOBILIARY: Diffuse hypoattenuation of the liver relative to the spleen suggests hepatic steatosis. No focal liver lesion or biliary dilatation. The gallbladder is normal. PANCREAS: The pancreatic parenchymal contours are normal and there is no ductal dilatation. There is no peripancreatic fluid collection. SPLEEN: Normal. ADRENALS/URINARY TRACT: --Adrenal glands: Normal. --Right  kidney/ureter: No hydronephrosis, nephroureterolithiasis, perinephric stranding or solid renal mass. --Left kidney/ureter: No hydronephrosis, nephroureterolithiasis, perinephric stranding or solid renal mass. --Urinary bladder: Normal for degree of distention STOMACH/BOWEL: --Stomach/Duodenum: There is no hiatal hernia or other gastric abnormality. The duodenal course and caliber are normal. --Small bowel: No dilatation or inflammation. --Colon: No focal abnormality. --Appendix: Normal. VASCULAR/LYMPHATIC: Atherosclerotic calcification is present within the non-aneurysmal abdominal aorta, without hemodynamically significant stenosis. No abdominal or pelvic lymphadenopathy. REPRODUCTIVE: Normal uterus and ovaries. MUSCULOSKELETAL. Multilevel degenerative disc disease and facet arthrosis. No bony spinal canal stenosis. OTHER: None. IMPRESSION: 1. No obstructive uropathy or nephrolithiasis. 2. Hepatic steatosis. 3.  Aortic atherosclerosis (ICD10-I70.0). 4. 6 mm right lung base pulmonary nodule. Non-contrast chest CT at 6-12 months is recommended. If the nodule is stable at time of repeat CT, then future CT at 18-24 months (from today's scan) is considered optional for low-risk patients, but is recommended for high-risk patients. This recommendation follows the consensus statement: Guidelines for Management of Incidental Pulmonary Nodules Detected on CT Images: From the Fleischner Society 2017; Radiology 2017; 284:228-243. Electronically Signed   By: Ulyses Jarred M.D.   On: 12/17/2017 19:55    EKG: Independently reviewed.  Assessment/Plan Principal Problem:   AKI (acute kidney injury) ([place]) Active Problems:   CAP (community acquired pneumonia)   DM2 (diabetes mellitus, type 2) ([place])   HTN (hypertension)    1. AKI - 1. Urine lytes 2. IVF: NS at 150 cc/hr 3. Strict intake and output 4. Repeat BMP in AM 5. UA still pending 2. ? CAP - 1. Seen on CXR and started on rocephin / azithro 2. Is having  cough 3. CT just calling RLL 43m pulm nodule though 4. CAP pathway 5. Continue rocephin / azithro for now 6. Sputum culture 3. DM2 - 1. Lantus 15 QHS 2. Sensitive scale SSI AC 4. HTN - 1. Holding home lisinopril / HCTZ in setting of AKI  DVT prophylaxis: Lovenox Code Status: Full Family Communication: Family at bedside Disposition Plan: Home after admit Consults called: None Admission status: Admit to inpatient  Severity of Illness: The appropriate patient status for this patient is INPATIENT. Inpatient status is judged to be reasonable and necessary in order to provide the required intensity of service to ensure the patient's safety. The patient's presenting symptoms, physical exam findings, and initial radiographic and laboratory data in the context of their chronic comorbidities is felt to place them at high risk for further clinical deterioration. Furthermore, it is not anticipated that the patient will  be medically stable for discharge from the hospital within 2 midnights of admission. The following factors support the patient status of inpatient.   " The patient's presenting symptoms include polyuria, fatigue, subjective fever. " The initial radiographic and laboratory data are worrisome because of apparent basilar PNA on X ray, AKI with creat 2.6 up from 0.8 baseline. " The chronic co-morbidities include DM2, HTN.   * I certify that at the point of admission it is my clinical judgment that the patient will require inpatient hospital care spanning beyond 2 midnights from the point of admission due to high intensity of service, high risk for further deterioration and high frequency of surveillance required.Etta Quill DO [facility] Pager [phone] Only works nights!  If 7AM-7PM, please contact the primary day team physician taking care of patient  [email or web address] Password [email or web address]  12/17/2017, 10:01 PM

## 2017-12-17 NOTE — ED Provider Notes (Signed)
[place] EMERGENCY DEPARTMENT Provider Note   CSN: [id number] Arrival date & time: 12/17/17  1700     History   Chief Complaint Chief Complaint  Patient presents with  . Hyperglycemia    HPI Heather Petty is a 67 y.o. female.  HPI She is a 67 year old female with a past medical history of diabetes, hypertension, hyperlipidemia, asthma and gout who presents to the emergency department for evaluation of generalized fatigue, subjective fevers, and hyperglycemia.  Patient reportedly has been experiencing the symptoms for several days.  She normally has blood sugars that run in the 60-80 range, however her CNA checked her blood sugars earlier today and found them to be greater than 400.  As a result EMS was contacted for transport to the emergency department.  Patient did not initially want to accept transport, however after being urged by her CNA and her daughter she came to the emergency department for further evaluation.  In addition to patient's hyperglycemia she has had subjective fevers.  It is unclear how long this is been going on but she thinks it is been a few days.  She also has had increased frequency of urination without dysuria.  She reports appliance with her home insulin.  Her only other complaint at this time is of right foot pain that is consistent with her gouty type pain.  Patient initially denied any other complaints, however her daughter arrived later and states that patient has had decreased p.o. intake, cough, and generalized malaise for greater than 2 weeks.  Remaining review of systems as below.  Past Medical History:  Diagnosis Date  . Arthritis   . Asthma   . Diabetes mellitus   . Gout   . High cholesterol   . Hypertension   . Morbid obesity [facility])     Patient Active Problem List   Diagnosis Date Noted  . CAP (community acquired pneumonia) 12/17/2017  . AKI (acute kidney injury) ([place]) 12/17/2017  . DM2 (diabetes mellitus, type 2)  ([place]) 12/17/2017  . HTN (hypertension) 12/17/2017  . Gout     Past Surgical History:  Procedure Laterality Date  . TONSILLECTOMY       OB History   None      Home Medications    Prior to Admission medications   Medication Sig Start Date End Date Taking? Authorizing Provider  acetaminophen (TYLENOL) 325 MG tablet Take 650 mg by mouth every 6 (six) hours as needed (for pain).     [provider]  albuterol (VENTOLIN HFA) 108 (90 Base) MCG/ACT inhaler Inhale 2 puffs into the lungs 4 (four) times daily as needed for wheezing or shortness of breath.    [provider]  allopurinol (ZYLOPRIM) 300 MG tablet Take 300 mg by mouth daily. 11/14/17   [provider]  benazepril (LOTENSIN) 20 MG tablet Take 20 mg by mouth daily.    [provider]  doxepin (SINEQUAN) 25 MG capsule Take 25 mg by mouth at bedtime.      [provider]  famotidine (PEPCID) 20 MG tablet Take 20 mg by mouth 2 (two) times daily.      [provider]  furosemide (LASIX) 40 MG tablet Take 40 mg by mouth daily.    [provider]  glipiZIDE (GLUCOTROL XL) 10 MG 24 hr tablet Take 10 mg by mouth 2 (two) times daily.      [provider]  hydrochlorothiazide (MICROZIDE) 12.5 MG capsule Take 12.5 mg by mouth daily.  12/03/17   [provider]  HYDROcodone-acetaminophen (NORCO/VICODIN) 5-325 MG per tablet Take 1 tablet by mouth every 6 (six) hours as needed for moderate pain. 04/22/13   Dixon Boos, MD  indomethacin (INDOCIN) 50 MG capsule Take 1 capsule (50 mg total) by mouth 2 (two) times daily with a meal. 04/22/13   Chikowski, Charlyne Quale, MD  insulin glargine (LANTUS SOLOSTAR) 100 UNIT/ML injection Inject 25 Units into the skin at bedtime.     [provider]  LANTUS SOLOSTAR 100 UNIT/ML Solostar Pen Inject 25 Units into the skin at bedtime. 11/16/17   [provider]  meloxicam (MOBIC) 7.5 MG tablet Take 7.5 mg by mouth  daily as needed for pain.    [provider]  Multiple Vitamin (MULTIVITAMIN) tablet Take 1 tablet by mouth daily.    [provider]  pioglitazone (ACTOS) 30 MG tablet Take 30 mg by mouth daily.      [provider]  potassium chloride (K-DUR) 10 MEQ tablet Take 10 mEq by mouth daily. 12/03/17   [provider]  pravastatin (PRAVACHOL) 40 MG tablet Take 40 mg by mouth daily.    [provider]  traMADol (ULTRAM) 50 MG tablet Take 50 mg by mouth every 6 (six) hours as needed for moderate pain.    [provider]  Vitamin D, Ergocalciferol, (DRISDOL) 1.25 MG (50000 UT) CAPS capsule Take 50,000 Units by mouth every 7 (seven) days.    [provider]    Family History No family history on file.  Social History Social History   Tobacco Use  . Smoking status: Never Smoker  Substance Use Topics  . Alcohol use: No  . Drug use: No     Allergies   Peanut-containing drug products   Review of Systems Review of Systems  Constitutional: Positive for chills and fatigue. Negative for fever.  HENT: Negative for ear pain and sore throat.   Eyes: Negative for pain and visual disturbance.  Respiratory: Positive for cough. Negative for shortness of breath.   Cardiovascular: Negative for chest pain and palpitations.  Gastrointestinal: Positive for abdominal pain. Negative for vomiting.  Genitourinary: Positive for frequency. Negative for dysuria and hematuria.  Musculoskeletal: Negative for arthralgias and back pain.  Skin: Negative for color change and rash.  Neurological: Negative for seizures and syncope.  All other systems reviewed and are negative.    Physical Exam Updated Vital Signs BP (!) 110/46 (BP Location: Left Arm)   Pulse 79   Temp 98.2 F (36.8 C) (Oral)   Resp 16   Ht 5\' 3"  (1.6 m)   Wt (!) 136.1 kg   SpO2 100%   BMI 53.15 kg/m   Physical Exam  Constitutional: She is oriented to person, place, and time.  She appears well-developed and well-nourished. No distress.  HENT:  Head: Normocephalic and atraumatic.  Eyes: Conjunctivae are normal.  Neck: Neck supple.  Cardiovascular: Normal rate and regular rhythm.  Pulmonary/Chest: Effort normal. No respiratory distress.  Diminished breath sounds left side.   Abdominal: Soft. There is tenderness (Mild, generalized).  Musculoskeletal: She exhibits no edema.  Neurological: She is alert and oriented to person, place, and time.  Skin: Skin is warm and dry.  Psychiatric: She has a normal mood and affect.  Nursing note and vitals reviewed.    ED Treatments / Results  Labs (all labs ordered are listed, but only abnormal results are displayed) Labs Reviewed  CBC WITH DIFFERENTIAL/PLATELET - Abnormal; Notable for the  following components:      Result Value   RBC 3.44 (*)    Hemoglobin 10.2 (*)    HCT 32.2 (*)    Platelets 146 (*)    All other components within normal limits  COMPREHENSIVE METABOLIC PANEL - Abnormal; Notable for the following components:   Sodium 132 (*)    CO2 19 (*)    Glucose, Bld 384 (*)    BUN 62 (*)    Creatinine, Ser 2.43 (*)    Calcium 8.7 (*)    Albumin 3.0 (*)    AST 57 (*)    ALT 84 (*)    Alkaline Phosphatase 174 (*)    GFR calc non Af Amer 19 (*)    GFR calc Af Amer 22 (*)    All other components within normal limits  LIPASE, BLOOD - Abnormal; Notable for the following components:   Lipase 54 (*)    All other components within normal limits  BASIC METABOLIC PANEL - Abnormal; Notable for the following components:   CO2 19 (*)    Glucose, Bld 301 (*)    BUN 63 (*)    Creatinine, Ser 2.32 (*)    Calcium 8.3 (*)    GFR calc non Af Amer 20 (*)    GFR calc Af Amer 24 (*)    All other components within normal limits  GLUCOSE, CAPILLARY - Abnormal; Notable for the following components:   Glucose-Capillary 309 (*)    All other components within normal limits  CBG MONITORING, ED - Abnormal; Notable for the  following components:   Glucose-Capillary 347 (*)    All other components within normal limits  I-STAT VENOUS BLOOD GAS, ED - Abnormal; Notable for the following components:   pCO2, Ven 37.2 (*)    pO2, Ven 24.0 (*)    Bicarbonate 19.7 (*)    TCO2 21 (*)    Acid-base deficit 6.0 (*)    All other components within normal limits  CBG MONITORING, ED - Abnormal; Notable for the following components:   Glucose-Capillary 227 (*)    All other components within normal limits  CULTURE, BLOOD (ROUTINE X 2)  CULTURE, BLOOD (ROUTINE X 2)  CULTURE, BLOOD (ROUTINE X 2)  CULTURE, BLOOD (ROUTINE X 2)  EXPECTORATED SPUTUM ASSESSMENT W REFEX TO RESP CULTURE  GRAM STAIN  URINE CULTURE  URINALYSIS, ROUTINE W REFLEX MICROSCOPIC  BLOOD GAS, VENOUS  HIV ANTIBODY (ROUTINE TESTING W REFLEX)  STREP PNEUMONIAE URINARY ANTIGEN  URINALYSIS, ROUTINE W REFLEX MICROSCOPIC  SODIUM, URINE, RANDOM  CREATININE, URINE, RANDOM  I-STAT CG4 LACTIC ACID, ED    EKG EKG Interpretation  Date/Time:  Monday December 17 2017 17:31:50 EST Ventricular Rate:  74 PR Interval:    QRS Duration: 103 QT Interval:  412 QTC Calculation: 458 R Axis:   25 Text Interpretation:  Sinus rhythm Low voltage, precordial leads No significant change since last tracing Confirmed by Wandra Arthurs [phone]) on 12/17/2017 5:53:02 PM   Radiology Dg Chest 2 View  Result Date: 12/17/2017 CLINICAL DATA:  67 year old female with a history of polyuria EXAM: CHEST - 2 VIEW COMPARISON:  12/27/2010, 09/28/2006 FINDINGS: Cardiomediastinal silhouette unchanged. No evidence of central vascular congestion. No pneumothorax or pleural effusion. Low lung volumes. Lateral view demonstrates new ill-defined opacity at the posterior lung base. No displaced fracture. IMPRESSION: Lateral view is suggestive of basilar pneumonia. Electronically Signed   By: Corrie Mckusick D.O.   On: 12/17/2017 18:10   Ct Renal Stone Study  Result Date: 12/17/2017 CLINICAL DATA:   Flank pain and polyuria EXAM: CT ABDOMEN AND PELVIS WITHOUT CONTRAST TECHNIQUE: Multidetector CT imaging of the abdomen and pelvis was performed following the standard protocol without IV contrast. COMPARISON:  None. FINDINGS: LOWER CHEST: 6 mm nodule of the right lung base. HEPATOBILIARY: Diffuse hypoattenuation of the liver relative to the spleen suggests hepatic steatosis. No focal liver lesion or biliary dilatation. The gallbladder is normal. PANCREAS: The pancreatic parenchymal contours are normal and there is no ductal dilatation. There is no peripancreatic fluid collection. SPLEEN: Normal. ADRENALS/URINARY TRACT: --Adrenal glands: Normal. --Right kidney/ureter: No hydronephrosis, nephroureterolithiasis, perinephric stranding or solid renal mass. --Left kidney/ureter: No hydronephrosis, nephroureterolithiasis, perinephric stranding or solid renal mass. --Urinary bladder: Normal for degree of distention STOMACH/BOWEL: --Stomach/Duodenum: There is no hiatal hernia or other gastric abnormality. The duodenal course and caliber are normal. --Small bowel: No dilatation or inflammation. --Colon: No focal abnormality. --Appendix: Normal. VASCULAR/LYMPHATIC: Atherosclerotic calcification is present within the non-aneurysmal abdominal aorta, without hemodynamically significant stenosis. No abdominal or pelvic lymphadenopathy. REPRODUCTIVE: Normal uterus and ovaries. MUSCULOSKELETAL. Multilevel degenerative disc disease and facet arthrosis. No bony spinal canal stenosis. OTHER: None. IMPRESSION: 1. No obstructive uropathy or nephrolithiasis. 2. Hepatic steatosis. 3.  Aortic atherosclerosis (ICD10-I70.0). 4. 6 mm right lung base pulmonary nodule. Non-contrast chest CT at 6-12 months is recommended. If the nodule is stable at time of repeat CT, then future CT at 18-24 months (from today's scan) is considered optional for low-risk patients, but is recommended for high-risk patients. This recommendation follows the consensus  statement: Guidelines for Management of Incidental Pulmonary Nodules Detected on CT Images: From the Fleischner Society 2017; Radiology 2017; 284:228-243. Electronically Signed   By: Ulyses Jarred M.D.   On: 12/17/2017 19:55    Procedures Procedures (including critical care time)  Medications Ordered in ED Medications  insulin aspart (novoLOG) injection 0-9 Units (has no administration in time range)  insulin glargine (LANTUS) injection 15 Units (15 Units Subcutaneous Given 12/17/17 2358)  enoxaparin (LOVENOX) injection 30 mg (has no administration in time range)  0.9 %  sodium chloride infusion ( Intravenous New Bag/Given 12/17/17 2314)  cefTRIAXone (ROCEPHIN) 1 g in sodium chloride 0.9 % 100 mL IVPB (has no administration in time range)  azithromycin (ZITHROMAX) tablet 500 mg (has no administration in time range)  acetaminophen (TYLENOL) tablet 650 mg (650 mg Oral Given 12/17/17 2321)  acetaminophen (TYLENOL) tablet 1,000 mg (1,000 mg Oral Given 12/17/17 1732)  sodium chloride 0.9 % bolus 1,000 mL (0 mLs Intravenous Stopped 12/17/17 1804)  insulin aspart (novoLOG) injection 5 Units (5 Units Subcutaneous Given 12/17/17 1734)  cefTRIAXone (ROCEPHIN) 1 g in sodium chloride 0.9 % 100 mL IVPB (0 g Intravenous Stopped 12/17/17 2147)  azithromycin (ZITHROMAX) 500 mg in sodium chloride 0.9 % 250 mL IVPB (0 mg Intravenous Stopped 12/17/17 2231)     Initial Impression / Assessment and Plan / ED Course  I have reviewed the triage vital signs and the nursing notes.  Pertinent labs & imaging results that were available during my care of the patient were reviewed by me and considered in my medical decision making (see chart for details).     Patient is a 67 year old female with past medical history as detailed above who presents to the emergency department for evaluation of generalized malaise, subjective fevers, and hyperglycemia.  Upon arrival to the emergency department, the patient is in no  acute distress.  Her blood sugar with EMS was 441.  Secondary to her past  medical history as well as her presenting complaint multiple laboratory and imaging studies were obtained.  Patient's labs are remarkable for an acute kidney injury, elevate liver enzymes, mildly elevated lipase, and hyperglycemia.  Chest x-ray suggestive of pneumonia.  CT abdomen pelvis without acute intra-abdominal abnormality to explain patient's symptoms.  Given concern for acute kidney injury as well as pneumonia patient will be admitted to the hospital service for further evaluation and care.  Patient started on Rocephin and azithromycin intravenously while in the emergency department.  Patient also given IV fluid secondary to her acute kidney injury.  For further information regarding patient's continued hospital course please see admitting team's documentation.  The care of this patient was discussed with my attending physician Dr. Darl Householder, who voices agreement with work-up and ED disposition.  Final Clinical Impressions(s) / ED Diagnoses   Final diagnoses:  AKI (acute kidney injury) [facility])    ED Discharge Orders    None       Presli Fanguy, Chanda Busing, MD 12/18/17 1432    Drenda Freeze, MD 12/18/17 1505

## 2017-12-17 NOTE — ED Notes (Signed)
Patient is hard stick and stuck multiple times. EDP Darl Householder) placed ultrasound IV and notified that night shift phlebotomy would attempt cultures. EDP gave permission to start abx before cultures.

## 2017-12-18 ENCOUNTER — Encounter (HOSPITAL_COMMUNITY): Payer: Self-pay | Admitting: General Practice

## 2017-12-18 LAB — HEMOGLOBIN A1C
HEMOGLOBIN A1C: 6.7 % — AB (ref 4.8–5.6)
MEAN PLASMA GLUCOSE: 145.59 mg/dL

## 2017-12-18 LAB — GLUCOSE, CAPILLARY
GLUCOSE-CAPILLARY: 217 mg/dL — AB (ref 70–99)
GLUCOSE-CAPILLARY: 231 mg/dL — AB (ref 70–99)
Glucose-Capillary: 166 mg/dL — ABNORMAL HIGH (ref 70–99)
Glucose-Capillary: 212 mg/dL — ABNORMAL HIGH (ref 70–99)

## 2017-12-18 LAB — HIV ANTIBODY (ROUTINE TESTING W REFLEX): HIV Screen 4th Generation wRfx: NONREACTIVE

## 2017-12-18 MED ORDER — PNEUMOCOCCAL VAC POLYVALENT 25 MCG/0.5ML IJ INJ
0.5000 mL | INJECTION | INTRAMUSCULAR | Status: AC
  Administered 2017-12-19: 0.5 mL via INTRAMUSCULAR
  Filled 2017-12-18: qty 0.5

## 2017-12-18 MED ORDER — ENOXAPARIN SODIUM 40 MG/0.4ML ~~LOC~~ SOLN
40.0000 mg | Freq: Every day | SUBCUTANEOUS | Status: DC
  Administered 2017-12-19: 40 mg via SUBCUTANEOUS
  Filled 2017-12-18: qty 0.4

## 2017-12-18 MED ORDER — QUETIAPINE FUMARATE 50 MG PO TABS
25.0000 mg | ORAL_TABLET | Freq: Every day | ORAL | Status: DC
  Administered 2017-12-18 – 2017-12-23 (×6): 25 mg via ORAL
  Filled 2017-12-18 (×6): qty 1

## 2017-12-18 NOTE — Plan of Care (Signed)
  Problem: Education: Goal: Knowledge of General Education information will improve Description: Including pain rating scale, medication(s)/side effects and non-pharmacologic comfort measures Outcome: Progressing   Problem: Coping: Goal: Level of anxiety will decrease Outcome: Progressing   

## 2017-12-18 NOTE — Progress Notes (Signed)
PROGRESS NOTE    Heather Petty  KCL:[id number] DOB: May 29, 1950 DOA: 12/17/2017 PCP: [name], MD    Brief Narrative:   67 y.o. female with medical history significant of DM2, HTN, HLD, gout.  Patient presents to the ED for evaluation of generalized fatigue, subjective fevers, hyperglycemia.  Patient reports polyuria for past several days.  CNA at home checked BGLs found them in the 400s which is unusual for patient whos BGLs apparently usually run 60-80.  As a result EMS contacted to transport patient to ED.  Associated dysuria with increased urinary frequency per patient.  Assessment & Plan:   Principal Problem:   AKI (acute kidney injury) ([place]) Active Problems:   CAP (community acquired pneumonia)   DM2 (diabetes mellitus, type 2) ([place])   HTN (hypertension)  1. AKI - 1. Urinalysis ordered, pending 2. Urine lytes were ordered, still pending 3. Continue IVF hydration 4. Renal function somewhat improved, repeat bmet in AM 5. If no significant improvement in renal function despite IVF in AM, then consider renal US 2. CAP 1. Findings worrisome for PNA on CXR 2. Continued on rocephin / azithro 3. Sputum culture ordered, pending 4. Afebrile. No leukocytosis 3. DM2 - 1. Continued on Lantus 15 QHS with sensitive scale SSI AC 2. Glucose in the 160-200's thus far 3. Continue to titrate insulin with goal of euglycemia 4. HTN - 1. Holding home lisinopril / HCTZ in setting of AKI 2. BP stable and controlled off bp meds, suggesting dehydration 5. Dehydration 1. Dry mucus membranes with presenting ARF per above 2. Continue on IVF hydration 6. Schizophrenia 1. Patient known to local outpatient psychiatry 2. Family expresses concerns that patient is not being forthcoming with her psychiatrist, thus likely inadequately treated as outpatient 3. Pt is under the care of her daughter who shares concerns of patient refusing medical care at home, refusing to be seen by her  providers 4. Will consult Psychiatry regarding patient's ability to care for self  DVT prophylaxis: Lovenox subQ Code Status: Full Family Communication: Pt in room, family at bedside Disposition Plan: Uncertain at this time  Consultants:     Procedures:     Antimicrobials: Anti-infectives (From admission, onward)   Start     Dose/Rate Route Frequency Ordered Stop   12/18/17 2030  cefTRIAXone (ROCEPHIN) 1 g in sodium chloride 0.9 % 100 mL IVPB     1 g 200 mL/hr over 30 Minutes Intravenous Every 24 hours 12/17/17 2158 12/25/17 2029   12/18/17 2030  azithromycin (ZITHROMAX) tablet 500 mg     500 mg Oral Every 24 hours 12/17/17 2158 12/25/17 2029   12/17/17 1915  cefTRIAXone (ROCEPHIN) 1 g in sodium chloride 0.9 % 100 mL IVPB     1 g 200 mL/hr over 30 Minutes Intravenous  Once 12/17/17 1905 12/17/17 2147   12/17/17 1915  azithromycin (ZITHROMAX) 500 mg in sodium chloride 0.9 % 250 mL IVPB     500 mg 250 mL/hr over 60 Minutes Intravenous  Once 12/17/17 1905 12/17/17 2231       Subjective: Wanting to go home  Objective: Vitals:   12/17/17 1855 12/17/17 2304 12/18/17 0455 12/18/17 1419  BP: (!) 116/50 (!) 110/46 (!) 109/45 (!) 104/44  Pulse: 82 79 80 83  Resp: (!) 22 16 16 19   Temp:  98.2 F (36.8 C) 98.6 F (37 C) 99.8 F (37.7 C)  TempSrc:  Oral Oral Oral  SpO2: 100% 100% 100% 98%  Weight:      Height:  Intake/Output Summary (Last 24 hours) at 12/18/2017 1544 Last data filed at 12/18/2017 1421 Gross per 24 hour  Intake 3134.51 ml  Output 700 ml  Net 2434.51 ml   Filed Weights   12/17/17 1706  Weight: (!) 136.1 kg    Examination:  General exam: Appears calm and comfortable  Respiratory system: Clear to auscultation. Respiratory effort normal. Cardiovascular system: S1 & S2 heard, RRR Gastrointestinal system: Abdomen is nondistended, soft and nontender. No organomegaly or masses felt. Normal bowel sounds heard. Central nervous system: Alert, No  focal neurological deficits. Extremities: Symmetric 5 x 5 power. Skin: No rashes, lesions Psychiatry: Appears mildly anxious,affect appears appropriate.   Data Reviewed: I have personally reviewed following labs and imaging studies  CBC: Recent Labs  Lab 12/17/17 1729  WBC 4.4  NEUTROABS 3.0  HGB 10.2*  HCT 32.2*  MCV 93.6  PLT 503*   Basic Metabolic Panel: Recent Labs  Lab 12/17/17 1729 12/17/17 2327  NA 132* 135  K 3.9 3.9  CL 103 107  CO2 19* 19*  GLUCOSE 384* 301*  BUN 62* 63*  CREATININE 2.43* 2.32*  CALCIUM 8.7* 8.3*   GFR: Estimated Creatinine Clearance: 31 mL/min (A) (by C-G formula based on SCr of 2.32 mg/dL (H)). Liver Function Tests: Recent Labs  Lab 12/17/17 1729  AST 57*  ALT 84*  ALKPHOS 174*  BILITOT 0.8  PROT 6.7  ALBUMIN 3.0*   Recent Labs  Lab 12/17/17 2043  LIPASE 54*   No results for input(s): AMMONIA in the last 168 hours. Coagulation Profile: No results for input(s): INR, PROTIME in the last 168 hours. Cardiac Enzymes: No results for input(s): CKTOTAL, CKMB, CKMBINDEX, TROPONINI in the last 168 hours. BNP (last 3 results) No results for input(s): PROBNP in the last 8760 hours. HbA1C: Recent Labs    12/18/17 0800  HGBA1C 6.7*   CBG: Recent Labs  Lab 12/17/17 1708 12/17/17 2040 12/17/17 2308 12/18/17 0819 12/18/17 1234  GLUCAP 347* 227* 309* 166* 212*   Lipid Profile: No results for input(s): CHOL, HDL, LDLCALC, TRIG, CHOLHDL, LDLDIRECT in the last 72 hours. Thyroid Function Tests: No results for input(s): TSH, T4TOTAL, FREET4, T3FREE, THYROIDAB in the last 72 hours. Anemia Panel: No results for input(s): VITAMINB12, FOLATE, FERRITIN, TIBC, IRON, RETICCTPCT in the last 72 hours. Sepsis Labs: Recent Labs  Lab 12/17/17 1739  LATICACIDVEN 1.88    Recent Results (from the past 240 hour(s))  Blood culture (routine x 2)     Status: None (Preliminary result)   Collection Time: 12/17/17  8:43 PM  Result Value Ref  Range Status   Specimen Description BLOOD BLOOD LEFT FOREARM  Final   Special Requests   Final    BOTTLES DRAWN AEROBIC AND ANAEROBIC Blood Culture results may not be optimal due to an inadequate volume of blood received in culture bottles   Culture   Final    NO GROWTH < 24 HOURS Performed at [facility], [place] [address], [place], [place] [address]    Report Status PENDING  Incomplete  Blood culture (routine x 2)     Status: None (Preliminary result)   Collection Time: 12/17/17  8:43 PM  Result Value Ref Range Status   Specimen Description BLOOD LEFT HAND  Final   Special Requests   Final    BOTTLES DRAWN AEROBIC AND ANAEROBIC Blood Culture results may not be optimal due to an inadequate volume of blood received in culture bottles   Culture   Final  NO GROWTH < 24 HOURS Performed at [facility] [address] [place], [place] [address]    Report Status PENDING  Incomplete     Radiology Studies: Dg Chest 2 View  Result Date: 12/17/2017 CLINICAL DATA:  67 year old female with a history of polyuria EXAM: CHEST - 2 VIEW COMPARISON:  12/27/2010, 09/28/2006 FINDINGS: Cardiomediastinal silhouette unchanged. No evidence of central vascular congestion. No pneumothorax or pleural effusion. Low lung volumes. Lateral view demonstrates new ill-defined opacity at the posterior lung base. No displaced fracture. IMPRESSION: Lateral view is suggestive of basilar pneumonia. Electronically Signed   By: Corrie Mckusick D.O.   On: 12/17/2017 18:10   Ct Renal Stone Study  Result Date: 12/17/2017 CLINICAL DATA:  Flank pain and polyuria EXAM: CT ABDOMEN AND PELVIS WITHOUT CONTRAST TECHNIQUE: Multidetector CT imaging of the abdomen and pelvis was performed following the standard protocol without IV contrast. COMPARISON:  None. FINDINGS: LOWER CHEST: 6 mm nodule of the right lung base. HEPATOBILIARY: Diffuse hypoattenuation of the liver relative to the spleen suggests hepatic steatosis. No focal  liver lesion or biliary dilatation. The gallbladder is normal. PANCREAS: The pancreatic parenchymal contours are normal and there is no ductal dilatation. There is no peripancreatic fluid collection. SPLEEN: Normal. ADRENALS/URINARY TRACT: --Adrenal glands: Normal. --Right kidney/ureter: No hydronephrosis, nephroureterolithiasis, perinephric stranding or solid renal mass. --Left kidney/ureter: No hydronephrosis, nephroureterolithiasis, perinephric stranding or solid renal mass. --Urinary bladder: Normal for degree of distention STOMACH/BOWEL: --Stomach/Duodenum: There is no hiatal hernia or other gastric abnormality. The duodenal course and caliber are normal. --Small bowel: No dilatation or inflammation. --Colon: No focal abnormality. --Appendix: Normal. VASCULAR/LYMPHATIC: Atherosclerotic calcification is present within the non-aneurysmal abdominal aorta, without hemodynamically significant stenosis. No abdominal or pelvic lymphadenopathy. REPRODUCTIVE: Normal uterus and ovaries. MUSCULOSKELETAL. Multilevel degenerative disc disease and facet arthrosis. No bony spinal canal stenosis. OTHER: None. IMPRESSION: 1. No obstructive uropathy or nephrolithiasis. 2. Hepatic steatosis. 3.  Aortic atherosclerosis (ICD10-I70.0). 4. 6 mm right lung base pulmonary nodule. Non-contrast chest CT at 6-12 months is recommended. If the nodule is stable at time of repeat CT, then future CT at 18-24 months (from today's scan) is considered optional for low-risk patients, but is recommended for high-risk patients. This recommendation follows the consensus statement: Guidelines for Management of Incidental Pulmonary Nodules Detected on CT Images: From the Fleischner Society 2017; Radiology 2017; 284:228-243. Electronically Signed   By: Ulyses Jarred M.D.   On: 12/17/2017 19:55    Scheduled Meds: . azithromycin  500 mg Oral Q24H  . [START ON 12/19/2017] enoxaparin (LOVENOX) injection  40 mg Subcutaneous Daily  . insulin aspart  0-9  Units Subcutaneous TID WC  . insulin glargine  15 Units Subcutaneous QHS  . [START ON 12/19/2017] pneumococcal 23 valent vaccine  0.5 mL Intramuscular Tomorrow-1000   Continuous Infusions: . sodium chloride 100 mL/hr at 12/18/17 1234  . cefTRIAXone (ROCEPHIN)  IV       LOS: 1 day   Marylu Lund, MD [facility] Pager On Amion  If 7PM-7AM, please contact night-coverage 12/18/2017, 3:44 PM
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
## 2017-12-19 ENCOUNTER — Inpatient Hospital Stay (HOSPITAL_COMMUNITY): Payer: Medicare Other

## 2017-12-19 DIAGNOSIS — F200 Paranoid schizophrenia: Secondary | ICD-10-CM

## 2017-12-19 LAB — GLUCOSE, CAPILLARY
GLUCOSE-CAPILLARY: 173 mg/dL — AB (ref 70–99)
Glucose-Capillary: 132 mg/dL — ABNORMAL HIGH (ref 70–99)
Glucose-Capillary: 243 mg/dL — ABNORMAL HIGH (ref 70–99)
Glucose-Capillary: 267 mg/dL — ABNORMAL HIGH (ref 70–99)

## 2017-12-19 LAB — COMPREHENSIVE METABOLIC PANEL
ALK PHOS: 232 U/L — AB (ref 38–126)
ALT: 113 U/L — ABNORMAL HIGH (ref 0–44)
ANION GAP: 5 (ref 5–15)
AST: 131 U/L — ABNORMAL HIGH (ref 15–41)
Albumin: 2.2 g/dL — ABNORMAL LOW (ref 3.5–5.0)
BILIRUBIN TOTAL: 0.7 mg/dL (ref 0.3–1.2)
BUN: 55 mg/dL — ABNORMAL HIGH (ref 8–23)
CALCIUM: 7.7 mg/dL — AB (ref 8.9–10.3)
CO2: 19 mmol/L — ABNORMAL LOW (ref 22–32)
Chloride: 113 mmol/L — ABNORMAL HIGH (ref 98–111)
Creatinine, Ser: 1.57 mg/dL — ABNORMAL HIGH (ref 0.44–1.00)
GFR, EST AFRICAN AMERICAN: 38 mL/min — AB (ref >60)
GFR, EST NON AFRICAN AMERICAN: 33 mL/min — AB (ref >60)
Glucose, Bld: 226 mg/dL — ABNORMAL HIGH (ref 70–99)
Potassium: 4.3 mmol/L (ref 3.5–5.1)
SODIUM: 137 mmol/L (ref 135–145)
TOTAL PROTEIN: 5.1 g/dL — AB (ref 6.5–8.1)

## 2017-12-19 LAB — URINALYSIS, ROUTINE W REFLEX MICROSCOPIC
Bilirubin Urine: NEGATIVE
GLUCOSE, UA: 50 mg/dL — AB
Hgb urine dipstick: NEGATIVE
Ketones, ur: NEGATIVE mg/dL
LEUKOCYTES UA: NEGATIVE
Nitrite: NEGATIVE
PH: 5 (ref 5.0–8.0)
Protein, ur: NEGATIVE mg/dL
Specific Gravity, Urine: 1.014 (ref 1.005–1.030)

## 2017-12-19 LAB — SODIUM, URINE, RANDOM: Sodium, Ur: 108 mmol/L

## 2017-12-19 LAB — STREP PNEUMONIAE URINARY ANTIGEN: Strep Pneumo Urinary Antigen: NEGATIVE

## 2017-12-19 LAB — CREATININE, URINE, RANDOM: CREATININE, URINE: 64.73 mg/dL

## 2017-12-19 MED ORDER — VITAMIN D (ERGOCALCIFEROL) 1.25 MG (50000 UNIT) PO CAPS
50000.0000 [IU] | ORAL_CAPSULE | ORAL | Status: DC
  Administered 2017-12-20: 50000 [IU] via ORAL
  Filled 2017-12-19: qty 1

## 2017-12-19 MED ORDER — ALLOPURINOL 300 MG PO TABS: 300.0000 mg | ORAL_TABLET | Freq: Every day | ORAL | Status: DC

## 2017-12-19 MED ORDER — ADULT MULTIVITAMIN W/MINERALS CH
1.0000 | ORAL_TABLET | Freq: Every day | ORAL | Status: DC
  Administered 2017-12-19 – 2017-12-24 (×6): 1 via ORAL
  Filled 2017-12-19 (×6): qty 1

## 2017-12-19 MED ORDER — HYDROCHLOROTHIAZIDE 12.5 MG PO CAPS
12.5000 mg | ORAL_CAPSULE | Freq: Every day | ORAL | Status: DC
  Administered 2017-12-19 – 2017-12-24 (×6): 12.5 mg via ORAL
  Filled 2017-12-19 (×8): qty 1

## 2017-12-19 MED ORDER — ENOXAPARIN SODIUM 80 MG/0.8ML ~~LOC~~ SOLN
65.0000 mg | Freq: Every day | SUBCUTANEOUS | Status: DC
  Administered 2017-12-20 – 2017-12-24 (×5): 65 mg via SUBCUTANEOUS
  Filled 2017-12-19 (×5): qty 0.8

## 2017-12-19 MED ORDER — INSULIN GLARGINE 100 UNIT/ML ~~LOC~~ SOLN
25.0000 [IU] | Freq: Every day | SUBCUTANEOUS | Status: DC
  Administered 2017-12-19 – 2017-12-22 (×4): 25 [IU] via SUBCUTANEOUS
  Filled 2017-12-19 (×5): qty 0.25

## 2017-12-19 MED ORDER — PRAVASTATIN SODIUM 40 MG PO TABS
40.0000 mg | ORAL_TABLET | Freq: Every day | ORAL | Status: DC
  Administered 2017-12-19 – 2017-12-23 (×5): 40 mg via ORAL
  Filled 2017-12-19 (×5): qty 1

## 2017-12-19 MED ORDER — DOXEPIN HCL 25 MG PO CAPS
25.0000 mg | ORAL_CAPSULE | Freq: Every day | ORAL | Status: DC
  Administered 2017-12-19 – 2017-12-23 (×5): 25 mg via ORAL
  Filled 2017-12-19 (×6): qty 1

## 2017-12-19 MED ORDER — RISPERIDONE 0.5 MG PO TABS
0.5000 mg | ORAL_TABLET | Freq: Two times a day (BID) | ORAL | Status: DC
  Administered 2017-12-19 – 2017-12-24 (×10): 0.5 mg via ORAL
  Filled 2017-12-19 (×11): qty 1

## 2017-12-19 MED ORDER — FAMOTIDINE 20 MG PO TABS
20.0000 mg | ORAL_TABLET | Freq: Two times a day (BID) | ORAL | Status: DC
  Administered 2017-12-19 – 2017-12-24 (×10): 20 mg via ORAL
  Filled 2017-12-19 (×10): qty 1

## 2017-12-19 NOTE — Evaluation (Signed)
Occupational Therapy Evaluation Patient Details Name: Heather Petty MRN: [id number] DOB: 10/27/1950 Today's Date: 12/19/2017    History of Present Illness 67 y.o. female with medical history significant of DM2, HTN, HLD, gout as well as unmedicated schizophrenia.  Patient presents to the ED for evaluation of generalized fatigue, subjective fevers, hyperglycemia and dysuria with increased urinary frequency per patient Pt dx with AKI and chest x-rays revealed PNA. Pt noted to have some delusional conversations with staff.    Clinical Impression   This 67 y/o female presents with the above. Pt typically lives alone at baseline, has an aide who assists 5 days/wk for ADL needs and typically uses power w/c for mobility, performing stand pivot transfers to/from w/c using SPC. Pt presenting with decreased activity tolerance, pain in bil LEs and generalized weakness. Pt currently requires two person assist for safe bed mobility to sit EOB and to attempt to stand with PT. Pt currently requires minA for UB ADL, max-totalA for LB and toileting ADLs at bed level. Pt will benefit from continued acute OT services and recommend follow up therapy services in SNF setting after discharge to progress pt towards PLOF. Will follow.     Follow Up Recommendations  SNF;Supervision/Assistance - 24 hour    Equipment Recommendations  Other (comment)(TBD in next venue)           Precautions / Restrictions Precautions Precautions: Fall Precaution Comments: can't remember but thinks she fell recently Restrictions Weight Bearing Restrictions: No      Mobility Bed Mobility Overal bed mobility: Needs Assistance Bed Mobility: Rolling Rolling: Min assist;+2 for safety/equipment         General bed mobility comments: minA to roll to L/R for changing of bed pad/linens, heavy use of bed rails to assist                                                                    ADL either  performed or assessed with clinical judgement   ADL Overall ADL's : Needs assistance/impaired Eating/Feeding: NPO   Grooming: Set up;Bed level   Upper Body Bathing: Minimal assistance;Bed level   Lower Body Bathing: Maximal assistance;Total assistance;Bed level   Upper Body Dressing : Moderate assistance;Bed level   Lower Body Dressing: Maximal assistance;Total assistance;Bed level Lower Body Dressing Details (indicate cue type and reason): `     Toileting- Clothing Manipulation and Hygiene: Total assistance;Bed level;+2 for safety/equipment Toileting - Clothing Manipulation Details (indicate cue type and reason): pt reports pure wick leaking during session; pt able to perform bed mobility with overall minA (+2 safety) for changing of bed linens, etc       General ADL Comments: pt presenting with increased pain/fatigue; bed level eval completed as PT reports pt earlier requiring +2 for safe bed mobility to sit EOB both due to need for physical assist as well as impulsivity/decreased safety                         Pertinent Vitals/Pain Pain Assessment: Faces Faces Pain Scale: Hurts even more Pain Location: B LE, L foot>R foot  Pain Descriptors / Indicators: Guarding;Grimacing;Sore Pain Intervention(s): Limited activity within patient's tolerance;Monitored during session;Repositioned     Hand Dominance Right   Extremity/Trunk Assessment  Upper Extremity Assessment Upper Extremity Assessment: LUE deficits/detail;Generalized weakness LUE Deficits / Details: decreased AROM due to weakness, PROM WFL though effortful for pt    Lower Extremity Assessment Lower Extremity Assessment: Defer to PT evaluation       Communication Communication Communication: Prefers language other than English(Spanish speaker)   Cognition Arousal/Alertness: Awake/alert Behavior During Therapy: Flat affect;Impulsive Overall Cognitive Status: Impaired/Different from baseline Area of  Impairment: Safety/judgement;Following commands;Awareness                       Following Commands: Follows multi-step commands inconsistently Safety/Judgement: Decreased awareness of safety;Decreased awareness of deficits Awareness: Emergent       General Comments       Exercises     Shoulder Instructions      Home Living Family/patient expects to be discharged to:: Private residence Living Arrangements: Alone Available Help at Discharge: Personal care attendant;Family;Available PRN/intermittently;Other (Comment)(aide 3hr/day 5day/week, daughter daily ) Type of Home: House Home Access: Ramped entrance     Home Layout: One level     Bathroom Shower/Tub: Astronomer Accessibility: Yes(w/c in bathroom, walker to get in shower)   Home Equipment: Walker - standard;Cane - single point;Shower seat - built in;Wheelchair Hotel manager - power          Prior Functioning/Environment Level of Independence: Needs assistance  Gait / Transfers Assistance Needed: uses cane to transfer to electric w/c,  ADL's / Homemaking Assistance Needed: aide 3hrs/day, 5 days/wk for bathing, grooming light house work, daughter generally helps in morning and evening            OT Problem List: Decreased strength;Decreased range of motion;Decreased activity tolerance;Impaired balance (sitting and/or standing);Decreased safety awareness;Obesity;Pain      OT Treatment/Interventions: Self-care/ADL training;Therapeutic exercise;DME and/or AE instruction;Therapeutic activities;Patient/family education;Balance training    OT Goals(Current goals can be found in the care plan section) Acute Rehab OT Goals Patient Stated Goal: get home to her dog "tinker" OT Goal Formulation: With patient Time For Goal Achievement: 01/02/18 Potential to Achieve Goals: Good  OT Frequency: Min 3X/week   Barriers to D/C:            Co-evaluation              AM-PAC PT "6  Clicks" Daily Activity     Outcome Measure Help from another person eating meals?: None Help from another person taking care of personal grooming?: A Little Help from another person toileting, which includes using toliet, bedpan, or urinal?: Total Help from another person bathing (including washing, rinsing, drying)?: A Lot Help from another person to put on and taking off regular upper body clothing?: A Lot Help from another person to put on and taking off regular lower body clothing?: Total 6 Click Score: 13   End of Session Equipment Utilized During Treatment: Oxygen Nurse Communication: Mobility status  Activity Tolerance: Patient tolerated treatment well;Patient limited by fatigue Patient left: in bed;with call bell/phone within reach;with bed alarm set;with nursing/sitter in room;with family/visitor present  OT Visit Diagnosis: Muscle weakness (generalized) (M62.81)                Time: 9407-6808 OT Time Calculation (min): 28 min Charges:  OT General Charges $OT Visit: 1 Visit OT Evaluation $OT Eval Moderate Complexity: 1 Mod OT Treatments $Self Care/Home Management : 8-22 mins  Lou Cal, OT [facility] Pager [phone] Office [phone]   Raymondo Band 12/19/2017, 2:36 PM

## 2017-12-19 NOTE — NC FL2 (Signed)
[place] LEVEL OF CARE SCREENING TOOL     IDENTIFICATION  Patient Name: Heather Petty Birthdate: Apr 16, 1950 Sex: female Admission Date (Current Location): 12/17/2017  [place] and [place] Number:  Heather Petty [id number] [place] and Address:  [facility], [place] [address], [place], [place] [address]      Provider Number: [id number]  Attending Physician Name and Address:  Cristy Folks, MD  Relative Name and Phone Number:  Luna Fuse, sister, [phone]    Current Level of Care: Hospital Recommended Level of Care: [place] Prior Approval Number:    Date Approved/Denied:   PASRR Number: pending  Discharge Plan: SNF    Current Diagnoses: Patient Active Problem List   Diagnosis Date Noted  . CAP (community acquired pneumonia) 12/17/2017  . AKI (acute kidney injury) ([place]) 12/17/2017  . DM2 (diabetes mellitus, type 2) ([place]) 12/17/2017  . HTN (hypertension) 12/17/2017  . Gout     Orientation RESPIRATION BLADDER Height & Weight     Self, Time, Situation, Place  O2(nasal canula) Continent, External catheter Weight: (!) 300 lb 0.7 oz (136.1 kg) Height:  5\' 3"  (160 cm)  BEHAVIORAL SYMPTOMS/MOOD NEUROLOGICAL BOWEL NUTRITION STATUS      Continent Diet  AMBULATORY STATUS COMMUNICATION OF NEEDS Skin   Extensive Assist Verbally Other (Comment)(blister on right buttocks)                       Personal Care Assistance Level of Assistance  Bathing, Feeding, Dressing Bathing Assistance: Maximum assistance Feeding assistance: Independent Dressing Assistance: Maximum assistance     Functional Limitations Info  Sight, Hearing, Speech Sight Info: Adequate Hearing Info: Adequate Speech Info: Adequate    SPECIAL CARE FACTORS FREQUENCY  PT (By licensed PT), OT (By licensed OT)     PT Frequency: 5x week OT Frequency: 5x week            Contractures Contractures Info: Not present    Additional  Factors Info  Code Status, Allergies, Psychotropic, Insulin Sliding Scale Code Status Info: Full Code Allergies Info: PEANUT-CONTAINING DRUG PRODUCTS  Psychotropic Info: doxepin (SINEQUAN) capsule 25 mg daily at bedtime PO; QUEtiapine (SEROQUEL) tablet 25 mg daily at bedtime PO Insulin Sliding Scale Info: insulin aspart (novoLOG) injection 0-9 Units 3x daily with meals; insulin glargine (LANTUS) injection 25 Units daily at bedtime       Current Medications (12/19/2017):  This is the current hospital active medication list Current Facility-Administered Medications  Medication Dose Route Frequency Provider Last Rate Last Dose  . 0.9 %  sodium chloride infusion   Intravenous Continuous Donne Hazel, MD 100 mL/hr at 12/19/17 0830    . acetaminophen (TYLENOL) tablet 650 mg  650 mg Oral Q6H PRN Schorr, Rhetta Mura, NP   650 mg at 12/18/17 2146  . allopurinol (ZYLOPRIM) tablet 300 mg  300 mg Oral Daily Purohit, Shrey C, MD      . azithromycin (ZITHROMAX) tablet 500 mg  500 mg Oral Q24H Jennette Kettle M, DO   500 mg at 12/18/17 2048  . cefTRIAXone (ROCEPHIN) 1 g in sodium chloride 0.9 % 100 mL IVPB  1 g Intravenous Q24H Jennette Kettle M, DO 200 mL/hr at 12/18/17 2053 1 g at 12/18/17 2053  . doxepin (SINEQUAN) capsule 25 mg  25 mg Oral QHS Purohit, Shrey C, MD      . Derrill Memo ON 12/20/2017] enoxaparin (LOVENOX) injection 65 mg  65 mg Subcutaneous Daily Purohit, Konrad Dolores, MD      .  famotidine (PEPCID) tablet 20 mg  20 mg Oral BID Purohit, Shrey C, MD      . hydrochlorothiazide (MICROZIDE) capsule 12.5 mg  12.5 mg Oral Daily Purohit, Shrey C, MD      . insulin aspart (novoLOG) injection 0-9 Units  0-9 Units Subcutaneous TID WC Etta Quill, DO   2 Units at 12/19/17 0815  . insulin glargine (LANTUS) injection 25 Units  25 Units Subcutaneous QHS Purohit, Shrey C, MD      . multivitamin tablet 1 tablet  1 tablet Oral Daily Purohit, Shrey C, MD      . pravastatin (PRAVACHOL) tablet 40 mg  40 mg Oral  Daily Purohit, Shrey C, MD      . QUEtiapine (SEROQUEL) tablet 25 mg  25 mg Oral QHS Suella Broad, FNP   25 mg at 12/18/17 2054  . Vitamin D (Ergocalciferol) (DRISDOL) capsule 50,000 Units  50,000 Units Oral Q7 days Purohit, Konrad Dolores, MD         Discharge Medications: Please see discharge summary for a list of discharge medications.  Relevant Imaging Results:  Relevant Lab Results:   Additional Information SS# [place], [place]

## 2017-12-19 NOTE — Social Work (Signed)
CSW acknowledging consult for SNF placement- will await psychiatric recommendation. Pt has Medicare Pt B which does not cover SNF placements, pt able to pursue SNF under Medicaid benefit should she agree to stay in placement for 30 days and be willing to potentially utilize SSI check for additional SNF costs.   Will again, wait for psychiatry recommendations to determine most appropriate discharge setting.   Westley Hummer, MSW, [place] Work [phone]

## 2017-12-19 NOTE — Progress Notes (Signed)
Inpatient Diabetes Program Recommendations  AACE/ADA: New Consensus Statement on Inpatient Glycemic Control (2015)  Target Ranges:  Prepandial:   less than 140 mg/dL      Peak postprandial:   less than 180 mg/dL (1-2 hours)      Critically ill patients:  140 - 180 mg/dL   Lab Results  Component Value Date   GLUCAP 173 (H) 12/19/2017   HGBA1C 6.7 (H) 12/18/2017    Review of Glycemic Control Results for Heather Petty, Heather Petty (MRN [id number]) as of 12/19/2017 09:58  Ref. Range 12/18/2017 17:16 12/18/2017 21:18 12/19/2017 07:52  Glucose-Capillary Latest Ref Range: 70 - 99 mg/dL 217 (H) 231 (H) 173 (H)   Diabetes history: Type 2 DM Outpatient Diabetes medications: Lantus 25 units QHS, Glipizide 10 mg BID, Actos 30 mg QD Current orders for Inpatient glycemic control: Novolog 0-9 units TID, Lantus 15 units QHS  Inpatient Diabetes Program Recommendations:    Consider increasing Lantus to 20 units QHS.   Thanks, Bronson Curb, MSN, RNC-OB Diabetes Coordinator [phone] (8a-5p)

## 2017-12-19 NOTE — Consult Note (Addendum)
[facility] Face-to-Face Psychiatry Consult   Reason for Consult:  "Hx schizophrenia. Question if patient able to care for self?" Referring Physician:  Dr. Herbert Moors  Patient Identification: Heather Petty MRN:  [id number] Principal Diagnosis: Paranoid schizophrenia [facility]) Diagnosis:   Patient Active Problem List   Diagnosis Date Noted  . CAP (community acquired pneumonia) [J18.9] 12/17/2017  . AKI (acute kidney injury) ([place]) [N17.9] 12/17/2017  . DM2 (diabetes mellitus, type 2) ([place]) [E11.9] 12/17/2017  . HTN (hypertension) [I10] 12/17/2017  . Gout [M10.9]     Total Time spent with patient: 1 hour  Subjective:   Heather Petty is a 67 y.o. female patient admitted with AKI.  HPI:   Per chart review, patient was admitted with AKI. She was later found to have CAP. Her CNA reports that her BGs have been in the 400s when they are usually around 60-80. She has a history of schizophrenia. She has been refusing medical care at home and has been refusing to see her outpatient providers. She has been paranoid and delusional. She believes that there is an "electrical person underneath her home that is shocking her" which prevents her from leaving her bed. Seroquel 25 mg qhs was started yesterday.   Ms. Stream is Spanish speaking so an interpreter was used to interview patient. She reports, "That is what they say" when asked if she has a history of schizophrenia.  She denies SI, HI or AVH.  She does admit to feeling depressed because it is near the anniversary of her mother's death.  She reports that the holidays are a difficult time for her.  She denies problems with sleep or appetite.  She is followed at [facility].  She reports compliance with her medications.  She is oriented to person place and time.  She reports difficulty with word finding at times.  She provided verbal consent to speak to her daughter.  Patient's daughter was contacted by phone.  She reports that her mother has been diagnosed with  bipolar disorder, anxiety and paranoid schizophrenia.  She reports a chronic history of delusions which impair her daily functioning.  She has not left her house for the past 7-8 years because she believes that people are under her house and are electrocuting her.  She sometimes whispers on the phone because she believes that they are listening to her.  She has accused her family of stealing from her.  She practices rituals for going to sleep on the sofa nightly.  She uses multiple blankets which she stacks up on the sofa to prevent herself from being electrocuted.  She was supposed to have knee surgery several years ago but she had a fear that she would die so she has been using a wheelchair since to avoid undergoing surgery.  She has a aide that helps her daily with showers and cooking.  She also sorts her medications and ensures that she takes them.  Past Psychiatric History: Schizophrenia   Risk to Self:  None. Denies SI.  Risk to Others:  None. Denies HI. Prior Inpatient Therapy:  She was last hospitalized in her teens.  Prior Outpatient Therapy:  She is followed by Jinny Blossom.   Past Medical History:  Past Medical History:  Diagnosis Date  . AKI (acute kidney injury) ([place]) 12/17/2017  . Arthritis   . Asthma   . Depression   . Diabetes mellitus   . Gout   . High cholesterol   . Hypertension   . Morbid obesity ([place])   .  Pneumonia 12/17/2017  . Schizophrenia [facility])     Past Surgical History:  Procedure Laterality Date  . CYST EXCISION    . DENTAL SURGERY    . TONSILLECTOMY     Family History: History reviewed. No pertinent family history. Family Psychiatric  History: She was adopted but her biological mother had mental health problems and Alzheimer's disease.  Social History:  Social History   Substance and Sexual Activity  Alcohol Use No     Social History   Substance and Sexual Activity  Drug Use No    Social History   Socioeconomic History  . Marital status:  Single    Spouse name: Not on file  . Number of children: Not on file  . Years of education: Not on file  . Highest education level: Not on file  Occupational History  . Not on file  Social Needs  . Financial resource strain: Not on file  . Food insecurity:    Worry: Not on file    Inability: Not on file  . Transportation needs:    Medical: Not on file    Non-medical: Not on file  Tobacco Use  . Smoking status: Former Research scientist (life sciences)  . Smokeless tobacco: Never Used  Substance and Sexual Activity  . Alcohol use: No  . Drug use: No  . Sexual activity: Not on file  Lifestyle  . Physical activity:    Days per week: Not on file    Minutes per session: Not on file  . Stress: Not on file  Relationships  . Social connections:    Talks on phone: Not on file    Gets together: Not on file    Attends religious service: Not on file    Active member of club or organization: Not on file    Attends meetings of clubs or organizations: Not on file    Relationship status: Not on file  Other Topics Concern  . Not on file  Social History Narrative  . Not on file   Additional Social History: She is from [place]. She moved to [place] in 1991. She lives at home alone. She has a caregiver from 8-12 pm daily. She has an adult daughter. She denies alcohol or illicit substance use.     Allergies:   Allergies  Allergen Reactions  . Peanut-Containing Drug Products Other (See Comments)    Questionable?? May have outgrown it??    Labs:  Results for orders placed or performed during the hospital encounter of 12/17/17 (from the past 48 hour(s))  CBG monitoring, ED     Status: Abnormal   Collection Time: 12/17/17  5:08 PM  Result Value Ref Range   Glucose-Capillary 347 (H) 70 - 99 mg/dL   Comment 1 Notify RN    Comment 2 Document in Chart   CBC with Differential     Status: Abnormal   Collection Time: 12/17/17  5:29 PM  Result Value Ref Range   WBC 4.4 4.0 - 10.5 K/uL   RBC 3.44 (L)  3.87 - 5.11 MIL/uL   Hemoglobin 10.2 (L) 12.0 - 15.0 g/dL   HCT 32.2 (L) 36.0 - 46.0 %   MCV 93.6 80.0 - 100.0 fL   MCH 29.7 26.0 - 34.0 pg   MCHC 31.7 30.0 - 36.0 g/dL   RDW 15.1 11.5 - 15.5 %   Platelets 146 (L) 150 - 400 K/uL   nRBC 0.0 0.0 - 0.2 %   Neutrophils Relative % 68 %  Neutro Abs 3.0 1.7 - 7.7 K/uL   Lymphocytes Relative 17 %   Lymphs Abs 0.7 0.7 - 4.0 K/uL   Monocytes Relative 6 %   Monocytes Absolute 0.3 0.1 - 1.0 K/uL   Eosinophils Relative 5 %   Eosinophils Absolute 0.2 0.0 - 0.5 K/uL   Basophils Relative 1 %   Basophils Absolute 0.0 0.0 - 0.1 K/uL   Other 3 %   nRBC 0 0 /100 WBC    Comment: Performed at [facility], [place] [address] [place], [place] [address]  Comprehensive metabolic panel     Status: Abnormal   Collection Time: 12/17/17  5:29 PM  Result Value Ref Range   Sodium 132 (L) 135 - 145 mmol/L   Potassium 3.9 3.5 - 5.1 mmol/L   Chloride 103 98 - 111 mmol/L   CO2 19 (L) 22 - 32 mmol/L   Glucose, Bld 384 (H) 70 - 99 mg/dL   BUN 62 (H) 8 - 23 mg/dL   Creatinine, Ser 2.43 (H) 0.44 - 1.00 mg/dL   Calcium 8.7 (L) 8.9 - 10.3 mg/dL   Total Protein 6.7 6.5 - 8.1 g/dL   Albumin 3.0 (L) 3.5 - 5.0 g/dL   AST 57 (H) 15 - 41 U/L   ALT 84 (H) 0 - 44 U/L   Alkaline Phosphatase 174 (H) 38 - 126 U/L   Total Bilirubin 0.8 0.3 - 1.2 mg/dL   GFR calc non Af Amer 19 (L) >60 mL/min   GFR calc Af Amer 22 (L) >60 mL/min    Comment: (NOTE) The eGFR has been calculated using the CKD EPI equation. This calculation has not been validated in all clinical situations. eGFR's persistently <60 mL/min signify possible Chronic Kidney Disease.    Anion gap 10 5 - 15    Comment: Performed at [facility] [address], [place], [place] [address]  I-Stat CG4 Lactic Acid, ED     Status: None   Collection Time: 12/17/17  5:39 PM  Result Value Ref Range   Lactic Acid, Venous 1.88 0.5 - 1.9 mmol/L  I-Stat venous blood gas, ED     Status: Abnormal   Collection  Time: 12/17/17  5:45 PM  Result Value Ref Range   pH, Ven 7.331 7.250 - 7.430   pCO2, Ven 37.2 (L) 44.0 - 60.0 mmHg   pO2, Ven 24.0 (LL) 32.0 - 45.0 mmHg   Bicarbonate 19.7 (L) 20.0 - 28.0 mmol/L   TCO2 21 (L) 22 - 32 mmol/L   O2 Saturation 40.0 %   Acid-base deficit 6.0 (H) 0.0 - 2.0 mmol/L   Patient temperature 97.9 F    Sample type VENOUS    Comment NOTIFIED PHYSICIAN   CBG monitoring, ED     Status: Abnormal   Collection Time: 12/17/17  8:40 PM  Result Value Ref Range   Glucose-Capillary 227 (H) 70 - 99 mg/dL  Blood culture (routine x 2)     Status: None (Preliminary result)   Collection Time: 12/17/17  8:43 PM  Result Value Ref Range   Specimen Description BLOOD BLOOD LEFT FOREARM    Special Requests      BOTTLES DRAWN AEROBIC AND ANAEROBIC Blood Culture results may not be optimal due to an inadequate volume of blood received in culture bottles   Culture      NO GROWTH 2 DAYS Performed at [name], [address], [place],  [address]    Report Status PENDING  Blood culture (routine x 2)     Status: None (Preliminary result)   Collection Time: 12/17/17  8:43 PM  Result Value Ref Range   Specimen Description BLOOD LEFT HAND    Special Requests      BOTTLES DRAWN AEROBIC AND ANAEROBIC Blood Culture results may not be optimal due to an inadequate volume of blood received in culture bottles   Culture      NO GROWTH 2 DAYS Performed at [facility] [address], [place], [place] [address]    Report Status PENDING   Lipase, blood     Status: Abnormal   Collection Time: 12/17/17  8:43 PM  Result Value Ref Range   Lipase 54 (H) 11 - 51 U/L    Comment: Performed at [facility], [place] [address] [place], [place] [address]  Glucose, capillary     Status: Abnormal   Collection Time: 12/17/17 11:08 PM  Result Value Ref Range   Glucose-Capillary 309 (H) 70 - 99 mg/dL  Culture, blood (routine x 2) Call MD if unable to obtain prior to antibiotics  being given     Status: None (Preliminary result)   Collection Time: 12/17/17 11:27 PM  Result Value Ref Range   Specimen Description BLOOD LEFT HAND    Special Requests      BOTTLES DRAWN AEROBIC ONLY Blood Culture results may not be optimal due to an inadequate volume of blood received in culture bottles   Culture      NO GROWTH 1 DAY Performed at [facility] [address], [place], [place] [address]    Report Status PENDING   Culture, blood (routine x 2) Call MD if unable to obtain prior to antibiotics being given     Status: None (Preliminary result)   Collection Time: 12/17/17 11:27 PM  Result Value Ref Range   Specimen Description BLOOD LEFT HAND    Special Requests      BOTTLES DRAWN AEROBIC ONLY Blood Culture results may not be optimal due to an inadequate volume of blood received in culture bottles   Culture      NO GROWTH 1 DAY Performed at [facility], [place] [address] [place],  [address]    Report Status PENDING   HIV antibody (Routine Screening)     Status: None   Collection Time: 12/17/17 11:27 PM  Result Value Ref Range   HIV Screen 4th Generation wRfx Non Reactive Non Reactive    Comment: (NOTE) Performed At: [facility] [place], [place] [id number] Rush Farmer MD FY:[id number]   Basic metabolic panel     Status: Abnormal   Collection Time: 12/17/17 11:27 PM  Result Value Ref Range   Sodium 135 135 - 145 mmol/L   Potassium 3.9 3.5 - 5.1 mmol/L   Chloride 107 98 - 111 mmol/L   CO2 19 (L) 22 - 32 mmol/L   Glucose, Bld 301 (H) 70 - 99 mg/dL   BUN 63 (H) 8 - 23 mg/dL   Creatinine, Ser 2.32 (H) 0.44 - 1.00 mg/dL   Calcium 8.3 (L) 8.9 - 10.3 mg/dL   GFR calc non Af Amer 20 (L) >60 mL/min   GFR calc Af Amer 24 (L) >60 mL/min    Comment: (NOTE) The eGFR has been calculated using the CKD EPI equation. This calculation has not been validated in all clinical situations. eGFR's persistently <60 mL/min signify possible  Chronic Kidney Disease.  Anion gap 9 5 - 15    Comment: Performed at [facility] [address] [place], [place] [address]  Hemoglobin A1c     Status: Abnormal   Collection Time: 12/18/17  8:00 AM  Result Value Ref Range   Hgb A1c MFr Bld 6.7 (H) 4.8 - 5.6 %    Comment: (NOTE) Pre diabetes:          5.7%-6.4% Diabetes:              >6.4% Glycemic control for   <7.0% adults with diabetes    Mean Plasma Glucose 145.59 mg/dL    Comment: Performed at [facility] [address] [place], [place] [address]  Glucose, capillary     Status: Abnormal   Collection Time: 12/18/17  8:19 AM  Result Value Ref Range   Glucose-Capillary 166 (H) 70 - 99 mg/dL  Glucose, capillary     Status: Abnormal   Collection Time: 12/18/17 12:34 PM  Result Value Ref Range   Glucose-Capillary 212 (H) 70 - 99 mg/dL  Glucose, capillary     Status: Abnormal   Collection Time: 12/18/17  5:16 PM  Result Value Ref Range   Glucose-Capillary 217 (H) 70 - 99 mg/dL  Glucose, capillary     Status: Abnormal   Collection Time: 12/18/17  9:18 PM  Result Value Ref Range   Glucose-Capillary 231 (H) 70 - 99 mg/dL   Comment 1 Notify RN    Comment 2 Document in Chart   Comprehensive metabolic panel     Status: Abnormal   Collection Time: 12/19/17  1:21 AM  Result Value Ref Range   Sodium 137 135 - 145 mmol/L   Potassium 4.3 3.5 - 5.1 mmol/L   Chloride 113 (H) 98 - 111 mmol/L   CO2 19 (L) 22 - 32 mmol/L   Glucose, Bld 226 (H) 70 - 99 mg/dL   BUN 55 (H) 8 - 23 mg/dL   Creatinine, Ser 1.57 (H) 0.44 - 1.00 mg/dL   Calcium 7.7 (L) 8.9 - 10.3 mg/dL   Total Protein 5.1 (L) 6.5 - 8.1 g/dL   Albumin 2.2 (L) 3.5 - 5.0 g/dL   AST 131 (H) 15 - 41 U/L   ALT 113 (H) 0 - 44 U/L   Alkaline Phosphatase 232 (H) 38 - 126 U/L   Total Bilirubin 0.7 0.3 - 1.2 mg/dL   GFR calc non Af Amer 33 (L) >60 mL/min   GFR calc Af Amer 38 (L) >60 mL/min    Comment: (NOTE) The eGFR has been calculated using the CKD EPI  equation. This calculation has not been validated in all clinical situations. eGFR's persistently <60 mL/min signify possible Chronic Kidney Disease.    Anion gap 5 5 - 15    Comment: Performed at [facility] [address], [place], [place] [address]  Glucose, capillary     Status: Abnormal   Collection Time: 12/19/17  7:52 AM  Result Value Ref Range   Glucose-Capillary 173 (H) 70 - 99 mg/dL   Comment 1 Notify RN   Urinalysis, Routine w reflex microscopic     Status: Abnormal   Collection Time: 12/19/17 11:31 AM  Result Value Ref Range   Color, Urine YELLOW YELLOW   APPearance CLEAR CLEAR   Specific Gravity, Urine 1.014 1.005 - 1.030   pH 5.0 5.0 - 8.0   Glucose, UA 50 (A) NEGATIVE mg/dL   Hgb urine dipstick NEGATIVE NEGATIVE  Bilirubin Urine NEGATIVE NEGATIVE   Ketones, ur NEGATIVE NEGATIVE mg/dL   Protein, ur NEGATIVE NEGATIVE mg/dL   Nitrite NEGATIVE NEGATIVE   Leukocytes, UA NEGATIVE NEGATIVE    Comment: Performed at [facility] [address] [place], [place] [address]  Sodium, urine, random     Status: None   Collection Time: 12/19/17 11:33 AM  Result Value Ref Range   Sodium, Ur 108 mmol/L    Comment: Performed at [facility] [address], [place], [place] [address]  Creatinine, urine, random     Status: None   Collection Time: 12/19/17 11:33 AM  Result Value Ref Range   Creatinine, Urine 64.73 mg/dL    Comment: Performed at [facility] [address] [place], [place] [address]    Current Facility-Administered Medications  Medication Dose Route Frequency Provider Last Rate Last Dose  . 0.9 %  sodium chloride infusion   Intravenous Continuous Donne Hazel, MD 100 mL/hr at 12/19/17 0830    . acetaminophen (TYLENOL) tablet 650 mg  650 mg Oral Q6H PRN Schorr, Rhetta Mura, NP   650 mg at 12/18/17 2146  . azithromycin (ZITHROMAX) tablet 500 mg  500 mg Oral Q24H Jennette Kettle M, DO   500 mg at 12/18/17 2048  . cefTRIAXone  (ROCEPHIN) 1 g in sodium chloride 0.9 % 100 mL IVPB  1 g Intravenous Q24H Jennette Kettle M, DO 200 mL/hr at 12/18/17 2053 1 g at 12/18/17 2053  . [START ON 12/20/2017] enoxaparin (LOVENOX) injection 65 mg  65 mg Subcutaneous Daily Purohit, Shrey C, MD      . insulin aspart (novoLOG) injection 0-9 Units  0-9 Units Subcutaneous TID WC Etta Quill, DO   2 Units at 12/19/17 0815  . insulin glargine (LANTUS) injection 15 Units  15 Units Subcutaneous QHS Etta Quill, DO   15 Units at 12/18/17 2144  . QUEtiapine (SEROQUEL) tablet 25 mg  25 mg Oral QHS Suella Broad, FNP   25 mg at 12/18/17 2054    Musculoskeletal: Strength & Muscle Tone: within normal limits Gait & Station: UTA since patient is lying in bed. Patient leans: N/A  Psychiatric Specialty Exam: Physical Exam  Nursing note and vitals reviewed. Constitutional: She is oriented to person, place, and time. She appears well-developed and well-nourished.  HENT:  Head: Normocephalic and atraumatic.  Neck: Normal range of motion.  Respiratory: Effort normal.  Musculoskeletal: Normal range of motion.  Neurological: She is alert and oriented to person, place, and time.  Psychiatric: Her speech is normal and behavior is normal. Judgment and thought content normal. Cognition and memory are normal. She exhibits a depressed mood.    Review of Systems  Gastrointestinal: Negative for constipation, diarrhea, nausea and vomiting.  Neurological: Positive for headaches.  Psychiatric/Behavioral: Positive for depression. Negative for hallucinations, substance abuse and suicidal ideas. The patient does not have insomnia.   All other systems reviewed and are negative.   Blood pressure (!) 127/53, pulse 79, temperature 98.6 F (37 C), temperature source Oral, resp. rate 18, height 5' 3"  (1.6 m), weight (!) 136.1 kg, SpO2 100 %.Body mass index is 53.15 kg/m.  General Appearance: Fairly Groomed, elderly, Hispanic female, wearing a  hospital gown with long, gray, unbrushed hair who is lying in bed. NAD.   Eye Contact:  Good  Speech:  Clear and Coherent and Normal Rate  Volume:  Normal  Mood:  Depressed  Affect:  Congruent  Thought Process:  Goal Directed, Linear and Descriptions of Associations: Intact  Orientation:  Full (Time, Place, and Person)  Thought Content:  Logical  Suicidal Thoughts:  No  Homicidal Thoughts:  No  Memory:  Immediate;   Good Recent;   Good Remote;   Good  Judgement:  Fair  Insight:  Fair  Psychomotor Activity:  Normal  Concentration:  Concentration: Good and Attention Span: Good  Recall:  Good  Fund of Knowledge:  Good  Language:  Good  Akathisia:  No  Handed:  Right  AIMS (if indicated):   N/A  Assets:  Communication Skills Desire for Improvement Housing Leisure Time Social Support  ADL's:  Impaired  Cognition:  WNL  Sleep:    Okay   Assessment:  Aariyah Sampey is a 67 y.o. female who was admitted with AKI. She was later found to have CAP. She is appropriate in behavior and thought process. She denies SI, HI or AVH. She does not appear to be responding to internal stimuli and is organized in thought process. Family report a chronic history of delusions secondary to schizophrenia that impair her daily functioning (will not leave the house) but does attend to her ADLs with the help of her caregiver. Recommend low dose Risperdal since it appears that she has untreated symptoms of schizophrenia. She does not warrant inpatient psychiatric hospitalization at this time since she is not an imminent risk of harm to self or others. PT recommends SNF placement which seems most appropriate at this time. She should follow up with her PCP for further medication management.   Treatment Plan Summary: -Continue Doxepin 25 mg qhs for depression/sleep.  -Start Risperdal 0.5 mg BID for chronic delusions (although possibility that they are fixed and may not improve).   -EKG reviewed and QTc 458 on  11/11. Please closely monitor when starting or increasing QTc prolonging agents.  -Patient should follow up with Jinny Blossom for further medication management.  -Psychiatry will sign off on patient at this time. Please consult psychiatry again as needed.    Disposition: Patient does not meet criteria for psychiatric inpatient admission.  Faythe Dingwall, DO 12/19/2017 12:52 PM

## 2017-12-19 NOTE — Progress Notes (Signed)
PROGRESS NOTE    Heather Petty  XBM:[id number] DOB: Sep 01, 1950 DOA: 12/17/2017 PCP: [name], MD   Brief Narrative:  67 year old with past medical history relevant for type 2 diabetes on insulin, hypertension, gout, hyperlipidemia, possible schizophrenia, chronic venous stasis who presented with subjective fevers, hyperglycemia and shortness of breath and found to have community-acquired pneumonia and AKI.   Assessment & Plan:   Principal Problem:   AKI (acute kidney injury) ([place]) Active Problems:   CAP (community acquired pneumonia)   DM2 (diabetes mellitus, type 2) ([place])   HTN (hypertension)   #) Community acquired pneumonia: Noted on x-ray.  Not seen on renal CT. - Continue IV ceftriaxone and azithromycin started 12/17/2017 - Blood cultures from 12/17/2017 no growth to date  #) Elevated LFTs: Appears to be predominantly hepatocellular in origin.  He does interestingly enough to have an elevated alkaline phosphatase. - We will check acute hepatitis panel -Right upper quadrant ultrasound  #) AKI: Patient presented with elevated creatinine.  Last creatinine was in 2015.  Regardless this appears to be improving with IV fluids.  Suspect there was a component of possible infection and more likely hyperglycemia contributing. -Continue IV fluids -Hold nephrotoxins  #) Type 2 diabetes: -Continue glargine 25 units nightly -Sliding scale insulin, SHS - Hold glipizide 10 mg twice daily - Hold p.o. glitazone 30 mg daily  #) Hypertension/hyperlipidemia: -Hold benazepril 20 mg daily -Continue HCTZ 12.5 mg daily - Continue pravastatin 40 mg daily  #) Lower extremity edema: -Hold furosemide 40 mg daily  Fluids: Gentle IV fluids Electrodes: Monitor and supplement Nutrition: Carb restricted diet  Prophylaxis: Subcu heparin  Disposition: Per physical therapy needs skilled nursing facility  Full code   Consultants:   None  Procedures:   None  Antimicrobials:     IV ceftriaxone and azithromycin started 12/17/2017   Subjective: Using Spanish interpreter patient reports that she is feeling better.  Her shortness of breath is resolved dramatically.  She denies any nausea, vomiting, diarrhea, cough, congestion, rhinorrhea.  Objective: Vitals:   12/18/17 1419 12/18/17 2043 12/19/17 0526 12/19/17 1349  BP: (!) 104/44 (!) 109/47 (!) 127/53 (!) 129/52  Pulse: 83 84 79 87  Resp: 19 18 18 17   Temp: 99.8 F (37.7 C) 98.4 F (36.9 C) 98.6 F (37 C) 98.5 F (36.9 C)  TempSrc: Oral Oral Oral Oral  SpO2: 98% 100% 100% 100%  Weight:      Height:        Intake/Output Summary (Last 24 hours) at 12/19/2017 1352 Last data filed at 12/19/2017 0900 Gross per 24 hour  Intake 2989.11 ml  Output 1600 ml  Net 1389.11 ml   Filed Weights   12/17/17 1706  Weight: (!) 136.1 kg    Examination:  General exam: Appears calm and comfortable  Respiratory system: Clear to auscultation. Respiratory effort normal. Cardiovascular system: Regular rate and rhythm, no murmurs. Gastrointestinal system: Soft, nondistended, no rebound or guarding, plus bowel sounds. Central nervous system: Alert and oriented.  Grossly intact, moving all extremities Extremities: Bilateral lower extremity edema Skin: Chronic lower extremity dermatitis Psychiatry: Judgement and insight appear normal. Mood & affect appropriate.     Data Reviewed: I have personally reviewed following labs and imaging studies  CBC: Recent Labs  Lab 12/17/17 1729  WBC 4.4  NEUTROABS 3.0  HGB 10.2*  HCT 32.2*  MCV 93.6  PLT 027*   Basic Metabolic Panel: Recent Labs  Lab 12/17/17 1729 12/17/17 2327 12/19/17 0121  NA 132* 135 137  K 3.9 3.9 4.3  CL 103 107 113*  CO2 19* 19* 19*  GLUCOSE 384* 301* 226*  BUN 62* 63* 55*  CREATININE 2.43* 2.32* 1.57*  CALCIUM 8.7* 8.3* 7.7*   GFR: Estimated Creatinine Clearance: 45.9 mL/min (A) (by C-G formula based on SCr of 1.57 mg/dL (H)). Liver  Function Tests: Recent Labs  Lab 12/17/17 1729 12/19/17 0121  AST 57* 131*  ALT 84* 113*  ALKPHOS 174* 232*  BILITOT 0.8 0.7  PROT 6.7 5.1*  ALBUMIN 3.0* 2.2*   Recent Labs  Lab 12/17/17 2043  LIPASE 54*   No results for input(s): AMMONIA in the last 168 hours. Coagulation Profile: No results for input(s): INR, PROTIME in the last 168 hours. Cardiac Enzymes: No results for input(s): CKTOTAL, CKMB, CKMBINDEX, TROPONINI in the last 168 hours. BNP (last 3 results) No results for input(s): PROBNP in the last 8760 hours. HbA1C: Recent Labs    12/18/17 0800  HGBA1C 6.7*   CBG: Recent Labs  Lab 12/18/17 1234 12/18/17 1716 12/18/17 2118 12/19/17 0752 12/19/17 1250  GLUCAP 212* 217* 231* 173* 132*   Lipid Profile: No results for input(s): CHOL, HDL, LDLCALC, TRIG, CHOLHDL, LDLDIRECT in the last 72 hours. Thyroid Function Tests: No results for input(s): TSH, T4TOTAL, FREET4, T3FREE, THYROIDAB in the last 72 hours. Anemia Panel: No results for input(s): VITAMINB12, FOLATE, FERRITIN, TIBC, IRON, RETICCTPCT in the last 72 hours. Sepsis Labs: Recent Labs  Lab 12/17/17 1739  LATICACIDVEN 1.88    Recent Results (from the past 240 hour(s))  Blood culture (routine x 2)     Status: None (Preliminary result)   Collection Time: 12/17/17  8:43 PM  Result Value Ref Range Status   Specimen Description BLOOD BLOOD LEFT FOREARM  Final   Special Requests   Final    BOTTLES DRAWN AEROBIC AND ANAEROBIC Blood Culture results may not be optimal due to an inadequate volume of blood received in culture bottles   Culture   Final    NO GROWTH 2 DAYS Performed at [facility], [place] [address], [place], [place] [address]    Report Status PENDING  Incomplete  Blood culture (routine x 2)     Status: None (Preliminary result)   Collection Time: 12/17/17  8:43 PM  Result Value Ref Range Status   Specimen Description BLOOD LEFT HAND  Final   Special Requests   Final    BOTTLES DRAWN  AEROBIC AND ANAEROBIC Blood Culture results may not be optimal due to an inadequate volume of blood received in culture bottles   Culture   Final    NO GROWTH 2 DAYS Performed at [facility], [place] [address] [place], [place] [address]    Report Status PENDING  Incomplete  Culture, blood (routine x 2) Call MD if unable to obtain prior to antibiotics being given     Status: None (Preliminary result)   Collection Time: 12/17/17 11:27 PM  Result Value Ref Range Status   Specimen Description BLOOD LEFT HAND  Final   Special Requests   Final    BOTTLES DRAWN AEROBIC ONLY Blood Culture results may not be optimal due to an inadequate volume of blood received in culture bottles   Culture   Final    NO GROWTH 1 DAY Performed at [facility], [place] [address], [place], [place] [address]    Report Status PENDING  Incomplete  Culture, blood (routine x 2) Call MD if unable to obtain prior to  antibiotics being given     Status: None (Preliminary result)   Collection Time: 12/17/17 11:27 PM  Result Value Ref Range Status   Specimen Description BLOOD LEFT HAND  Final   Special Requests   Final    BOTTLES DRAWN AEROBIC ONLY Blood Culture results may not be optimal due to an inadequate volume of blood received in culture bottles   Culture   Final    NO GROWTH 1 DAY Performed at [facility], [place] [address] [place], [place] [address]    Report Status PENDING  Incomplete         Radiology Studies: Dg Chest 2 View  Result Date: 12/17/2017 CLINICAL DATA:  67 year old female with a history of polyuria EXAM: CHEST - 2 VIEW COMPARISON:  12/27/2010, 09/28/2006 FINDINGS: Cardiomediastinal silhouette unchanged. No evidence of central vascular congestion. No pneumothorax or pleural effusion. Low lung volumes. Lateral view demonstrates new ill-defined opacity at the posterior lung base. No displaced fracture. IMPRESSION: Lateral view is suggestive of basilar pneumonia. Electronically Signed    By: Corrie Mckusick D.O.   On: 12/17/2017 18:10   Ct Renal Stone Study  Result Date: 12/17/2017 CLINICAL DATA:  Flank pain and polyuria EXAM: CT ABDOMEN AND PELVIS WITHOUT CONTRAST TECHNIQUE: Multidetector CT imaging of the abdomen and pelvis was performed following the standard protocol without IV contrast. COMPARISON:  None. FINDINGS: LOWER CHEST: 6 mm nodule of the right lung base. HEPATOBILIARY: Diffuse hypoattenuation of the liver relative to the spleen suggests hepatic steatosis. No focal liver lesion or biliary dilatation. The gallbladder is normal. PANCREAS: The pancreatic parenchymal contours are normal and there is no ductal dilatation. There is no peripancreatic fluid collection. SPLEEN: Normal. ADRENALS/URINARY TRACT: --Adrenal glands: Normal. --Right kidney/ureter: No hydronephrosis, nephroureterolithiasis, perinephric stranding or solid renal mass. --Left kidney/ureter: No hydronephrosis, nephroureterolithiasis, perinephric stranding or solid renal mass. --Urinary bladder: Normal for degree of distention STOMACH/BOWEL: --Stomach/Duodenum: There is no hiatal hernia or other gastric abnormality. The duodenal course and caliber are normal. --Small bowel: No dilatation or inflammation. --Colon: No focal abnormality. --Appendix: Normal. VASCULAR/LYMPHATIC: Atherosclerotic calcification is present within the non-aneurysmal abdominal aorta, without hemodynamically significant stenosis. No abdominal or pelvic lymphadenopathy. REPRODUCTIVE: Normal uterus and ovaries. MUSCULOSKELETAL. Multilevel degenerative disc disease and facet arthrosis. No bony spinal canal stenosis. OTHER: None. IMPRESSION: 1. No obstructive uropathy or nephrolithiasis. 2. Hepatic steatosis. 3.  Aortic atherosclerosis (ICD10-I70.0). 4. 6 mm right lung base pulmonary nodule. Non-contrast chest CT at 6-12 months is recommended. If the nodule is stable at time of repeat CT, then future CT at 18-24 months (from today's scan) is considered  optional for low-risk patients, but is recommended for high-risk patients. This recommendation follows the consensus statement: Guidelines for Management of Incidental Pulmonary Nodules Detected on CT Images: From the Fleischner Society 2017; Radiology 2017; 284:228-243. Electronically Signed   By: Ulyses Jarred M.D.   On: 12/17/2017 19:55        Scheduled Meds: . azithromycin  500 mg Oral Q24H  . [START ON 12/20/2017] enoxaparin (LOVENOX) injection  65 mg Subcutaneous Daily  . insulin aspart  0-9 Units Subcutaneous TID WC  . insulin glargine  15 Units Subcutaneous QHS  . QUEtiapine  25 mg Oral QHS   Continuous Infusions: . sodium chloride 100 mL/hr at 12/19/17 0830  . cefTRIAXone (ROCEPHIN)  IV 1 g (12/18/17 2053)     LOS: 2 days    Time spent: [place], MD [facility]  If 7PM-7AM,  please contact night-coverage [email or web address] Password [email or web address] 12/19/2017, 1:52 PM

## 2017-12-19 NOTE — Progress Notes (Signed)
PHARMACY - PHYSICIAN COMMUNICATION CRITICAL VALUE ALERT - BLOOD CULTURE IDENTIFICATION (BCID)  Heather Petty is an 67 y.o. female who presented to [facility] on 12/17/2017 with a chief complaint of dysuria  Assessment:  1/6 BC positive for gpr  Name of physician (or Provider) Contacted: n/a  Current antibiotics: azithromycin and rocephin  Changes to prescribed antibiotics recommended:  None.  Likely contaminant  No results found for this or any previous visit.  [name] 12/19/2017  10:58 PM

## 2017-12-19 NOTE — Evaluation (Signed)
Physical Therapy Evaluation Patient Details Name: Heather Petty MRN: [id number] DOB: [birth year] Today's Date: 12/19/2017   History of Present Illness  67 y.o. female with medical history significant of DM2, HTN, HLD, gout as well as unmedicated schizophrenia.  Patient presents to the ED for evaluation of generalized fatigue, subjective fevers, hyperglycemia and dysuria with increased urinary frequency per patient Pt dx with AKI and chest x-rays revealed PNA. Pt noted to have some delusional conversations with staff.   Clinical Impression  PTA pt living alone with assistance from [place] 3 hrs/day and daughter in morning and evening. Pt reports independence with transfers from bed to wc to toilet, however requires assist for ADLs. Pt currently limited in safe mobility by increased pain in bilateral feet, and LE as well as her back. Pt requires maxA for bed mobility and was not able to come to fully upright with sit>stand due to pain in LE with weightbearing. PT recommends SNF level rehab at d/c before going home. PT will continue to follow acutely.     Follow Up Recommendations SNF    Equipment Recommendations  None recommended by PT       Precautions / Restrictions Precautions Precautions: Fall Precaution Comments: can't remember but thinks she fell recently Restrictions Weight Bearing Restrictions: No      Mobility  Bed Mobility Overal bed mobility: Needs Assistance Bed Mobility: Supine to Sit;Sit to Supine     Supine to sit: Mod assist Sit to supine: Max assist   General bed mobility comments: modA for coming EoB by managing LE to floor and trunk to upright, vc for reaching for bedrail to pull hips to EoB, maxA for bringing LE back into bed, pt requiring maximal verbal cuing for technique as she laid straight back in the bed and then flung herself forward, requiring blocking to keep from sliding onto floor, max A to slide LE around into bed   Transfers Overall transfer  level: Needs assistance Equipment used: Rolling walker (2 wheeled) Transfers: Sit to/from Stand Sit to Stand: Max assist         General transfer comment: maxA for 2x attempts with sit>stand unable to come to fully upright due to gout pain in bilateral feet L>R        Balance Overall balance assessment: Needs assistance Sitting-balance support: Feet supported;Bilateral upper extremity supported Sitting balance-Leahy Scale: Poor                                       Pertinent Vitals/Pain Pain Assessment: Faces Faces Pain Scale: Hurts whole lot Pain Location: B LE, L foot>R foot especially with weight bearing  Pain Descriptors / Indicators: Guarding;Grimacing    Home Living Family/patient expects to be discharged to:: Private residence Living Arrangements: Alone Available Help at Discharge: Personal care attendant;Family;Available PRN/intermittently;Other (Comment)(aide 3hr/day 5day/week, daughter daily ) Type of Home: House Home Access: Ramped entrance     Home Layout: One level Home Equipment: Walker - standard;Cane - single point;Shower seat - built in;Wheelchair Hotel manager - power      Prior Function Level of Independence: Needs assistance   Gait / Transfers Assistance Needed: uses cane to transfer to electric w/c,   ADL's / Homemaking Assistance Needed: aide 3hrs/day, 5 days/wk for bathing, grooming light house work, daughter generally helps in morning and evening           Extremity/Trunk Assessment   Upper Extremity  Assessment Upper Extremity Assessment: Generalized weakness    Lower Extremity Assessment Lower Extremity Assessment: RLE deficits/detail;LLE deficits/detail RLE Deficits / Details: hip and knee AROM limited by habitus, ankle AROM limited by pain, strength grossly assessed as 3+/5  RLE Sensation: history of peripheral neuropathy LLE Deficits / Details: hip and knee AROM limited by habitus, ankle AROM limited by pain,  strength grossly assessed as 3+/5  LLE Sensation: history of peripheral neuropathy    Cervical / Trunk Assessment Cervical / Trunk Assessment: Kyphotic(c/o of back pain)  Communication   Communication: Prefers language other than English(Spanish speaker, Zacarias Pontes interpreter Spero Geralds present )  Cognition Arousal/Alertness: Awake/alert Behavior During Therapy: Flat affect;Impulsive Overall Cognitive Status: Impaired/Different from baseline Area of Impairment: Safety/judgement;Following commands;Awareness                       Following Commands: Follows multi-step commands inconsistently Safety/Judgement: Decreased awareness of safety;Decreased awareness of deficits Awareness: Emergent   General Comments: decreased safety awareness with returning to bed , flopping back on bed and then coming forwad requiring blocking to keep from sliding off EoB      General Comments General comments (skin integrity, edema, etc.): Pt with erythema and warmth, especially in bilateral LE, painful to touch.  Pt with brusing on bilateral UE, pt reports due to blood draws,         Assessment/Plan    PT Assessment Patient needs continued PT services  PT Problem List Decreased strength;Decreased range of motion;Decreased activity tolerance;Decreased balance;Decreased mobility;Decreased safety awareness;Impaired sensation;Pain       PT Treatment Interventions DME instruction;Gait training;Functional mobility training;Therapeutic activities;Therapeutic exercise;Balance training;Patient/family education    PT Goals (Current goals can be found in the Care Plan section)  Acute Rehab PT Goals Patient Stated Goal: get home to her dog "tinker" PT Goal Formulation: With patient Time For Goal Achievement: 01/02/18 Potential to Achieve Goals: Fair    Frequency Min 2X/week    AM-PAC PT "6 Clicks" Daily Activity  Outcome Measure Difficulty turning over in bed (including adjusting bedclothes,  sheets and blankets)?: Unable Difficulty moving from lying on back to sitting on the side of the bed? : Unable Difficulty sitting down on and standing up from a chair with arms (e.g., wheelchair, bedside commode, etc,.)?: Unable Help needed moving to and from a bed to chair (including a wheelchair)?: Total Help needed walking in hospital room?: Total Help needed climbing 3-5 steps with a railing? : Total 6 Click Score: 6    End of Session Equipment Utilized During Treatment: Gait belt Activity Tolerance: Patient limited by pain;Patient limited by fatigue Patient left: in bed;with call bell/phone within reach;with nursing/sitter in room;with family/visitor present Nurse Communication: Mobility status PT Visit Diagnosis: Unsteadiness on feet (R26.81);Other abnormalities of gait and mobility (R26.89);Muscle weakness (generalized) (M62.81);Difficulty in walking, not elsewhere classified (R26.2);History of falling (Z91.81);Pain Pain - Right/Left: Left Pain - part of body: Ankle and joints of foot(L>R)    Time: 1030-1106 PT Time Calculation (min) (ACUTE ONLY): 36 min   Charges:   PT Evaluation $PT Eval Moderate Complexity: 1 Mod PT Treatments $Therapeutic Activity: 8-22 mins        Kalanie Fewell B. Migdalia Dk PT, DPT Acute Rehabilitation Services Pager [phone] Office [phone]   [place] 12/19/2017, 12:28 PM

## 2017-12-20 ENCOUNTER — Inpatient Hospital Stay (HOSPITAL_COMMUNITY): Payer: Medicare Other

## 2017-12-20 DIAGNOSIS — M7989 Other specified soft tissue disorders: Secondary | ICD-10-CM

## 2017-12-20 LAB — COMPREHENSIVE METABOLIC PANEL
ALT: 130 U/L — ABNORMAL HIGH (ref 0–44)
AST: 103 U/L — ABNORMAL HIGH (ref 15–41)
Albumin: 2.2 g/dL — ABNORMAL LOW (ref 3.5–5.0)
Alkaline Phosphatase: 312 U/L — ABNORMAL HIGH (ref 38–126)
BUN: 36 mg/dL — ABNORMAL HIGH (ref 8–23)
CO2: 18 mmol/L — ABNORMAL LOW (ref 22–32)
Calcium: 8 mg/dL — ABNORMAL LOW (ref 8.9–10.3)
Creatinine, Ser: 1.09 mg/dL — ABNORMAL HIGH (ref 0.44–1.00)
GFR calc non Af Amer: 51 mL/min — ABNORMAL LOW (ref >60)
Glucose, Bld: 260 mg/dL — ABNORMAL HIGH (ref 70–99)
Sodium: 138 mmol/L (ref 135–145)
Total Bilirubin: 0.7 mg/dL (ref 0.3–1.2)
Total Protein: 5.8 g/dL — ABNORMAL LOW (ref 6.5–8.1)

## 2017-12-20 LAB — URINE CULTURE

## 2017-12-20 LAB — CBC
HCT: 28.3 % — ABNORMAL LOW (ref 36.0–46.0)
Hemoglobin: 8.6 g/dL — ABNORMAL LOW (ref 12.0–15.0)
MCH: 29.1 pg (ref 26.0–34.0)
MCHC: 30.4 g/dL (ref 30.0–36.0)
MCV: 95.6 fL (ref 80.0–100.0)
Platelets: 169 10*3/uL (ref 150–400)
RBC: 2.96 MIL/uL — ABNORMAL LOW (ref 3.87–5.11)
RDW: 15.9 % — ABNORMAL HIGH (ref 11.5–15.5)
WBC: 3.2 K/uL — ABNORMAL LOW (ref 4.0–10.5)
nRBC: 0 % (ref 0.0–0.2)

## 2017-12-20 LAB — COMPREHENSIVE METABOLIC PANEL WITH GFR
Anion gap: 6 (ref 5–15)
Chloride: 114 mmol/L — ABNORMAL HIGH (ref 98–111)
GFR calc Af Amer: 59 mL/min — ABNORMAL LOW (ref >60)
Potassium: 4.3 mmol/L (ref 3.5–5.1)

## 2017-12-20 LAB — MAGNESIUM: Magnesium: 1.9 mg/dL (ref 1.7–2.4)

## 2017-12-20 LAB — GLUCOSE, CAPILLARY
GLUCOSE-CAPILLARY: 194 mg/dL — AB (ref 70–99)
Glucose-Capillary: 162 mg/dL — ABNORMAL HIGH (ref 70–99)
Glucose-Capillary: 292 mg/dL — ABNORMAL HIGH (ref 70–99)
Glucose-Capillary: 297 mg/dL — ABNORMAL HIGH (ref 70–99)

## 2017-12-20 LAB — PROCALCITONIN: Procalcitonin: 1.26 ng/mL

## 2017-12-20 MED ORDER — HYDROCODONE-ACETAMINOPHEN 5-325 MG PO TABS
2.0000 | ORAL_TABLET | Freq: Once | ORAL | Status: AC
  Administered 2017-12-20: 2 via ORAL
  Filled 2017-12-20: qty 2

## 2017-12-20 NOTE — Clinical Social Work Note (Signed)
Clinical Social Work Assessment  Patient Details  Name: Heather Petty MRN: [id number] Date of Birth: [birth year]  Date of referral:  12/20/17               Reason for consult:  Facility Placement, Discharge Planning                Permission sought to share information with:  Family Supports, Customer service manager Permission granted to share information::  Yes, Verbal Permission Granted  Name::     [place]arr[place]'s clerk and [place]::  SNFs  Relationship::  sister and daughter  Contact Information:  [phone]; [phone]  Housing/Transportation Living arrangements for the past 2 months:  [place] of Information:  Patient Patient Interpreter Needed:  None(pt declined interpreter services, preferred to use her sister; understands English to converse prefers to respond in [place] which her sister translated) Criminal Activity/Legal Involvement Pertinent to Current Situation/Hospitalization:  No - Comment as needed Significant Relationships:  Adult Children, Other Family Members, Pets, Siblings Lives with:  Self Do you feel safe going back to the place where you live?  Yes Need for family participation in patient care:  Yes (Comment)  Care giving concerns:  Pt lives at home alone, has an aide that comes in for about 3 hrs. Family interested in SNF placement, pt hesitant but amenable. On medications recommended now by [facility] in order to manage her paranoid schizophrenia, managed outpatient by Jinny Blossom.   Social Worker assessment / plan:  CSW met with pt and pt sister at bedside. Pt declined used of interpreter, understands CSW but responds in Spanish which her sister translates. Pt lives at home alone, has supportive family and an aide who she appears to trust and enjoy. CSW introduced self, role, and reason for visit. Pt initially hesitant however after some discussion with her sister she is agreeable to SNF placement. Pt understands  that she will have to agree to stay 30 days, she gets an SSI check and understands that she may have to use some of that for expenses.   Pt would like CSW to come back with offers and let daughter know about conversation when she is available after 3:30pm. Pt PASRR is currently pending.  Employment status:  Unemployed Forensic scientist:  Information systems manager, Medicaid In [place] PT Recommendations:  [place], [place] / Referral to community resources:  Outpatient Psychiatric Care (Comment Required), Skilled Nursing Facility(managed by Jinny Blossom)  Patient/Family's Response to care:  Pt initially hesitant but amenable to CSW visit, however after some discussion with her sister she is agreeable to SNF placement and conversed more easily with CSW.   Patient/Family's Understanding of and Emotional Response to Diagnosis, Current Treatment, and Prognosis:  Pt and pt family state understanding of diagnosis, current treatment and prognosis. Pt states understanding of why there is a need for SNF, she enjoys her home help and would like to return home after SNF stay. Pt family appears supportive and expresses appropriate goals for pt. Pt and pt sister were both emotionally appropriate throughout assessment, all questions asked were answered appropriate.  Emotional Assessment Appearance:  Appears stated age Attitude/Demeanor/Rapport:  Engaged, Gracious Affect (typically observed):  Accepting, Adaptable, Overwhelmed Orientation:  Oriented to Self, Oriented to Place, Oriented to  Time, Oriented to Situation Alcohol / Substance use:  Not Applicable Psych involvement (Current and /or in the community):  Outpatient Provider([facility])  Discharge Needs  Concerns to be addressed:  Care Coordination Readmission  within the last 30 days:  No Current discharge risk:  Psychiatric Illness, Lives alone, Dependent with Mobility Barriers to Discharge:  [place]  Tour manager), Ship broker, Continued Medical Work up   [employer], [place] 12/20/2017, 1:06 PM

## 2017-12-20 NOTE — Progress Notes (Signed)
PROGRESS NOTE    Tinea Nobile  ZGY:[id number] DOB: [birth year] DOA: 12/17/2017 PCP: [name], MD   Brief Narrative:  67 year old with past medical history relevant for type 2 diabetes on insulin, hypertension, gout, hyperlipidemia, possible schizophrenia, chronic venous stasis who presented with subjective fevers, hyperglycemia and shortness of breath and found to have community-acquired pneumonia and AKI.   Assessment & Plan:   Principal Problem:   Paranoid schizophrenia ([place]) Active Problems:   CAP (community acquired pneumonia)   AKI (acute kidney injury) ([place])   DM2 (diabetes mellitus, type 2) ([place])   HTN (hypertension)   #) Community acquired pneumonia: Noted on x-ray however limited by body habitus.  Not seen on renal CT. - Continue IV ceftriaxone and azithromycin started 12/17/2017 - Blood cultures from 12/17/2017 no growth to date -Procalcitonin elevated at 1.26  #) Bilateral lower extremity edema/pain: This morning patient reports increasing lower extremity edema and pain.  It is worse on the left than the right.  She does have some worsening erythema and warmth as well.  Differential diagnosis includes venous stasis disease, chronic lymphedema, may Thurner syndrome, bilateral cellulitis.  With her chest x-ray being relatively unimpressive and procalcitonin being elevated suspect this could be an additional place of infection. -Bilateral lower extremity DVT ordered -If this DVT is negative we will consider starting vancomycin for gram-positive coverage  #) Elevated LFTs/nonalcoholic steatohepatitis: Likely secondary to both antipsychotics as well as obesity. - Pending acute hepatitis panel -Right upper quadrant ultrasound shows evidence of steatohepatitis  #) AKI: Improving with IV fluids. -Discontinue IV fluids today -Hold nephrotoxins  #) Type 2 diabetes: -Continue glargine 25 units nightly -Sliding scale insulin, SHS - Hold glipizide 10 mg twice daily -  Hold p.o. glitazone 30 mg daily  #) Hypertension/hyperlipidemia: -Hold benazepril 20 mg daily -Continue HCTZ 12.5 mg daily - Continue pravastatin 40 mg daily  #) Lower extremity edema: -Hold furosemide 40 mg daily  Fluids: Gentle IV fluids Electrodes: Monitor and supplement Nutrition: Carb restricted diet  Prophylaxis: Subcu heparin  Disposition: Per physical therapy needs skilled nursing facility  Full code   Consultants:   None  Procedures:   12/20/2017 bilateral lower extremity DVT ultrasound  Antimicrobials:   IV ceftriaxone and azithromycin started 12/17/2017   Subjective: Using Spanish interpreter patient reports that she is feeling better from a respiratory standpoint but reports she continues to have worsening left greater than right pain.  She denies any numbness or tingling but does endorse some swelling.  She denies any cough, chest pain, congestion.  Objective: Vitals:   12/19/17 0526 12/19/17 1349 12/19/17 2153 12/20/17 0436  BP: (!) 127/53 (!) 129/52 (!) 130/57 (!) 155/62  Pulse: 79 87 88 85  Resp: 18 17 18 18   Temp: 98.6 F (37 C) 98.5 F (36.9 C) 98.3 F (36.8 C) 98.1 F (36.7 C)  TempSrc: Oral Oral Oral Oral  SpO2: 100% 100% 100% 100%  Weight:      Height:        Intake/Output Summary (Last 24 hours) at 12/20/2017 1247 Last data filed at 12/20/2017 0925 Gross per 24 hour  Intake 2600.68 ml  Output 1500 ml  Net 1100.68 ml   Filed Weights   12/17/17 1706  Weight: (!) 136.1 kg    Examination:  General exam: Appears calm and comfortable  Respiratory system: Clear to auscultation. Respiratory effort normal. Cardiovascular system: Regular rate and rhythm, no murmurs. Gastrointestinal system: Soft, nondistended, no rebound or guarding, plus bowel sounds. Central nervous system: Alert  and oriented.  Grossly intact, moving all extremities Extremities: Bilateral lower extremity edema Skin: Chronic lower extremity dermatitis, some  superimposed warmth and redness Psychiatry: Judgement and insight appear normal. Mood & affect appropriate.     Data Reviewed: I have personally reviewed following labs and imaging studies  CBC: Recent Labs  Lab 12/17/17 1729 12/20/17 0236  WBC 4.4 3.2*  NEUTROABS 3.0  --   HGB 10.2* 8.6*  HCT 32.2* 28.3*  MCV 93.6 95.6  PLT 146* 814   Basic Metabolic Panel: Recent Labs  Lab 12/17/17 1729 12/17/17 2327 12/19/17 0121 12/20/17 0236  NA 132* 135 137 138  K 3.9 3.9 4.3 4.3  CL 103 107 113* 114*  CO2 19* 19* 19* 18*  GLUCOSE 384* 301* 226* 260*  BUN 62* 63* 55* 36*  CREATININE 2.43* 2.32* 1.57* 1.09*  CALCIUM 8.7* 8.3* 7.7* 8.0*  MG  --   --   --  1.9   GFR: Estimated Creatinine Clearance: 66.1 mL/min (A) (by C-G formula based on SCr of 1.09 mg/dL (H)). Liver Function Tests: Recent Labs  Lab 12/17/17 1729 12/19/17 0121 12/20/17 0236  AST 57* 131* 103*  ALT 84* 113* 130*  ALKPHOS 174* 232* 312*  BILITOT 0.8 0.7 0.7  PROT 6.7 5.1* 5.8*  ALBUMIN 3.0* 2.2* 2.2*   Recent Labs  Lab 12/17/17 2043  LIPASE 54*   No results for input(s): AMMONIA in the last 168 hours. Coagulation Profile: No results for input(s): INR, PROTIME in the last 168 hours. Cardiac Enzymes: No results for input(s): CKTOTAL, CKMB, CKMBINDEX, TROPONINI in the last 168 hours. BNP (last 3 results) No results for input(s): PROBNP in the last 8760 hours. HbA1C: Recent Labs    12/18/17 0800  HGBA1C 6.7*   CBG: Recent Labs  Lab 12/19/17 1250 12/19/17 1750 12/19/17 2152 12/20/17 0837 12/20/17 1201  GLUCAP 132* 243* 267* 162* 194*   Lipid Profile: No results for input(s): CHOL, HDL, LDLCALC, TRIG, CHOLHDL, LDLDIRECT in the last 72 hours. Thyroid Function Tests: No results for input(s): TSH, T4TOTAL, FREET4, T3FREE, THYROIDAB in the last 72 hours. Anemia Panel: No results for input(s): VITAMINB12, FOLATE, FERRITIN, TIBC, IRON, RETICCTPCT in the last 72 hours. Sepsis Labs: Recent  Labs  Lab 12/17/17 1739 12/20/17 0236  PROCALCITON  --  1.26  LATICACIDVEN 1.88  --     Recent Results (from the past 240 hour(s))  Blood culture (routine x 2)     Status: None (Preliminary result)   Collection Time: 12/17/17  8:43 PM  Result Value Ref Range Status   Specimen Description BLOOD BLOOD LEFT FOREARM  Final   Special Requests   Final    BOTTLES DRAWN AEROBIC AND ANAEROBIC Blood Culture results may not be optimal due to an inadequate volume of blood received in culture bottles   Culture   Final    NO GROWTH 3 DAYS Performed at [facility], [place] [address], [place], [place] [address]    Report Status PENDING  Incomplete  Blood culture (routine x 2)     Status: None (Preliminary result)   Collection Time: 12/17/17  8:43 PM  Result Value Ref Range Status   Specimen Description BLOOD LEFT HAND  Final   Special Requests   Final    BOTTLES DRAWN AEROBIC AND ANAEROBIC Blood Culture results may not be optimal due to an inadequate volume of blood received in culture bottles   Culture   Final    NO GROWTH 3 DAYS Performed at  [facility], [place] [address] [place], [place] [address]    Report Status PENDING  Incomplete  Culture, blood (routine x 2) Call MD if unable to obtain prior to antibiotics being given     Status: None (Preliminary result)   Collection Time: 12/17/17 11:27 PM  Result Value Ref Range Status   Specimen Description BLOOD LEFT HAND  Final   Special Requests   Final    BOTTLES DRAWN AEROBIC ONLY Blood Culture results may not be optimal due to an inadequate volume of blood received in culture bottles   Culture  Setup Time   Final    AEROBIC BOTTLE ONLY GRAM POSITIVE RODS CRITICAL RESULT CALLED TO, READ BACK BY AND VERIFIED WITH: L SEAY PHARMD 12/20/2218 JDW Performed at [facility], [place] [address] [place], [place] [address]    Culture GRAM POSITIVE RODS  Final   Report Status PENDING  Incomplete  Culture, blood (routine x 2) Call MD if  unable to obtain prior to antibiotics being given     Status: None (Preliminary result)   Collection Time: 12/17/17 11:27 PM  Result Value Ref Range Status   Specimen Description BLOOD LEFT HAND  Final   Special Requests   Final    BOTTLES DRAWN AEROBIC ONLY Blood Culture results may not be optimal due to an inadequate volume of blood received in culture bottles   Culture   Final    NO GROWTH 2 DAYS Performed at [facility], [place] [address], [place], [place] [address]    Report Status PENDING  Incomplete  Culture, Urine     Status: None   Collection Time: 12/19/17 11:13 AM  Result Value Ref Range Status   Specimen Description URINE, CATHETERIZED  Final   Special Requests   Final    NONE Performed at [facility], [place] [address], [place], [place] [address]    Culture   Final    Multiple bacterial morphotypes present, none predominant. Suggest appropriate recollection if clinically indicated.   Report Status 12/20/2017 FINAL  Final         Radiology Studies: US Abdomen Limited Ruq  Result Date: 12/19/2017 CLINICAL DATA:  67 year old female with abnormal LFTs. EXAM: ULTRASOUND ABDOMEN LIMITED RIGHT UPPER QUADRANT COMPARISON:  CT Abdomen and Pelvis 12/17/2017. FINDINGS: Gallbladder: No gallstones or wall thickening visualized. No sonographic Murphy sign noted by sonographer. Common bile duct: Diameter: 6 millimeters, within normal limits. Liver: Echogenic liver (image 25). No discrete liver lesion. Portal vein is patent on color Doppler imaging with normal direction of blood flow towards the liver. Other findings: Negative visible right kidney.  The IMPRESSION: Fatty liver disease.  Otherwise negative. Electronically Signed   By: Genevie Ann M.D.   On: 12/19/2017 15:21        Scheduled Meds: . azithromycin  500 mg Oral Q24H  . doxepin  25 mg Oral QHS  . enoxaparin (LOVENOX) injection  65 mg Subcutaneous Daily  . famotidine  20 mg Oral BID  . hydrochlorothiazide  12.5  mg Oral Daily  . insulin aspart  0-9 Units Subcutaneous TID WC  . insulin glargine  25 Units Subcutaneous QHS  . multivitamin with minerals  1 tablet Oral Daily  . pravastatin  40 mg Oral q1800  . QUEtiapine  25 mg Oral QHS  . risperiDONE  0.5 mg Oral BID  . Vitamin D (Ergocalciferol)  50,000 Units Oral Q Thu   Continuous Infusions: . cefTRIAXone (ROCEPHIN)  IV Stopped (12/19/17 2108)     LOS: 3 days    Time spent: [place], MD [facility]  If 7PM-7AM, please contact night-coverage [email or web address] Password [facility] 12/20/2017, 12:47 PM

## 2017-12-20 NOTE — Progress Notes (Signed)
Bilateral lower extremity venous duplex has been completed. Negative for obvious evidence of DVT.  12/20/17 10:05 AM Heather Petty RVT

## 2017-12-20 NOTE — Care Management Important Message (Signed)
Important Message  Patient Details  Name: Armando Lauman MRN: [id number] Date of Birth: [birth year]   Medicare Important Message Given:  Yes    Jomari Bartnik [address] 12/20/2017, 3:29 PM

## 2017-12-21 LAB — CBC
HCT: 25.6 % — ABNORMAL LOW (ref 36.0–46.0)
Hemoglobin: 8.1 g/dL — ABNORMAL LOW (ref 12.0–15.0)
MCH: 30.2 pg (ref 26.0–34.0)
MCHC: 31.6 g/dL (ref 30.0–36.0)
MCV: 95.5 fL (ref 80.0–100.0)
Platelets: 153 K/uL (ref 150–400)
RBC: 2.68 MIL/uL — ABNORMAL LOW (ref 3.87–5.11)
RDW: 15.9 % — ABNORMAL HIGH (ref 11.5–15.5)
WBC: 3.5 K/uL — ABNORMAL LOW (ref 4.0–10.5)
nRBC: 0 % (ref 0.0–0.2)

## 2017-12-21 LAB — IRON AND TIBC
Iron: 78 ug/dL (ref 28–170)
Saturation Ratios: 43 % — ABNORMAL HIGH (ref 10.4–31.8)
TIBC: 181 ug/dL — ABNORMAL LOW (ref 250–450)
UIBC: 103 ug/dL

## 2017-12-21 LAB — GLUCOSE, CAPILLARY
GLUCOSE-CAPILLARY: 159 mg/dL — AB (ref 70–99)
GLUCOSE-CAPILLARY: 246 mg/dL — AB (ref 70–99)
Glucose-Capillary: 198 mg/dL — ABNORMAL HIGH (ref 70–99)
Glucose-Capillary: 276 mg/dL — ABNORMAL HIGH (ref 70–99)

## 2017-12-21 LAB — RETICULOCYTES
Immature Retic Fract: 9.2 % (ref 2.3–15.9)
RBC.: 2.68 MIL/uL — ABNORMAL LOW (ref 3.87–5.11)
Retic Count, Absolute: 35.1 10*3/uL (ref 19.0–186.0)
Retic Ct Pct: 1.3 % (ref 0.4–3.1)

## 2017-12-21 LAB — COMPREHENSIVE METABOLIC PANEL
AST: 104 U/L — ABNORMAL HIGH (ref 15–41)
Albumin: 2.2 g/dL — ABNORMAL LOW (ref 3.5–5.0)
Anion gap: 5 (ref 5–15)
BUN: 27 mg/dL — ABNORMAL HIGH (ref 8–23)
CO2: 20 mmol/L — ABNORMAL LOW (ref 22–32)
Calcium: 8.1 mg/dL — ABNORMAL LOW (ref 8.9–10.3)
Chloride: 113 mmol/L — ABNORMAL HIGH (ref 98–111)
Creatinine, Ser: 1.12 mg/dL — ABNORMAL HIGH (ref 0.44–1.00)
Glucose, Bld: 280 mg/dL — ABNORMAL HIGH (ref 70–99)
Potassium: 4.4 mmol/L (ref 3.5–5.1)
Sodium: 138 mmol/L (ref 135–145)

## 2017-12-21 LAB — HEPATITIS PANEL, ACUTE
HCV Ab: <0.1 s/co ratio (ref 0.0–0.9)
Hep A IgM: POSITIVE — AB
Hep B C IgM: NEGATIVE
Hepatitis B Surface Ag: NEGATIVE

## 2017-12-21 LAB — VITAMIN B12: Vitamin B-12: 276 pg/mL (ref 180–914)

## 2017-12-21 LAB — COMPREHENSIVE METABOLIC PANEL WITH GFR
ALT: 140 U/L — ABNORMAL HIGH (ref 0–44)
Alkaline Phosphatase: 323 U/L — ABNORMAL HIGH (ref 38–126)
GFR calc Af Amer: 57 mL/min — ABNORMAL LOW (ref >60)
GFR calc non Af Amer: 49 mL/min — ABNORMAL LOW (ref >60)
Total Bilirubin: 0.4 mg/dL (ref 0.3–1.2)
Total Protein: 5.2 g/dL — ABNORMAL LOW (ref 6.5–8.1)

## 2017-12-21 LAB — PROCALCITONIN: Procalcitonin: 1.13 ng/mL

## 2017-12-21 LAB — FERRITIN: Ferritin: 1642 ng/mL — ABNORMAL HIGH (ref 11–307)

## 2017-12-21 MED ORDER — GABAPENTIN 600 MG PO TABS
300.0000 mg | ORAL_TABLET | Freq: Every day | ORAL | Status: DC
  Administered 2017-12-21 – 2017-12-22 (×2): 300 mg via ORAL
  Filled 2017-12-21 (×2): qty 1

## 2017-12-21 MED ORDER — VITAMIN B-12 1000 MCG PO TABS
1000.0000 ug | ORAL_TABLET | Freq: Every day | ORAL | Status: DC
  Administered 2017-12-21 – 2017-12-24 (×4): 1000 ug via ORAL
  Filled 2017-12-21 (×4): qty 1

## 2017-12-21 NOTE — Social Work (Signed)
Pt PASRR has been submitted, however due to pertinent mental health history it requires an in person review by a QMHP. Will likely take until Monday/Tuesday for this to occur. This is required for pt to go to SNF.  3:26pm- CSW spoke with pt daughter- she has been given offers and will go visit them. She would like for pt to go to rehab but does have some hesitations for her since she is in [address] housing. Explained that pt sister should go visit SNFs and speak with them about finances, housing, and rehab services. Pt daughter understands PASRR is under review and that she will need that to go to SNF.    Will follow up with family on Monday.  Westley Hummer, MSW, [place] Work [phone]

## 2017-12-21 NOTE — Progress Notes (Signed)
Physical Therapy Treatment Patient Details Name: Heather Petty MRN: [id number] DOB: 1950-05-14 Today's Date: 12/21/2017    History of Present Illness 67 y.o. female with medical history significant of DM2, HTN, HLD, gout as well as unmedicated schizophrenia.  Patient presents to the ED for evaluation of generalized fatigue, subjective fevers, hyperglycemia and dysuria with increased urinary frequency per patient Pt dx with AKI and chest x-rays revealed PNA. Pt noted to have some delusional conversations with staff.     PT Comments    Pt required maximal verbal encouragement for participation in therapy today secondary to gout pain in bilateral LE. Pt only agreeable to bed level exercises. Pt requiring active assist to range of motion in ankles, knees and hips. Care taken with ankles and calves and they were painful to touch. PT continues to recommend SNF level rehab at d/c for pt to return to PLOF once pain is under control.    Follow Up Recommendations  SNF     Equipment Recommendations  None recommended by PT    Recommendations for Other Services       Precautions / Restrictions Precautions Precautions: Fall Precaution Comments: can't remember but thinks she fell recently Restrictions Weight Bearing Restrictions: No          Cognition Arousal/Alertness: Awake/alert Behavior During Therapy: Flat affect;Impulsive Overall Cognitive Status: Impaired/Different from baseline Area of Impairment: Safety/judgement;Following commands;Awareness                       Following Commands: Follows multi-step commands inconsistently Safety/Judgement: Decreased awareness of safety;Decreased awareness of deficits Awareness: Emergent          Exercises General Exercises - Upper Extremity Elbow Flexion: AROM;Both;10 reps;Supine Elbow Extension: AROM;Both;10 reps;Supine General Exercises - Lower Extremity Ankle Circles/Pumps: AAROM;Both;5 reps;Supine Heel Slides:  AAROM;Both;5 reps;Supine Straight Leg Raises: AAROM;Both;10 reps;Supine    General Comments General comments (skin integrity, edema, etc.): Pt with continued erythema and warmth in bilateral LE, painful to touch.       Pertinent Vitals/Pain Pain Assessment: Faces Faces Pain Scale: Hurts whole lot Pain Location: B LE, L foot>R foot with touching and movement  Pain Descriptors / Indicators: Guarding;Grimacing Pain Intervention(s): Limited activity within patient's tolerance           PT Goals (current goals can now be found in the care plan section) Acute Rehab PT Goals Patient Stated Goal: get home to her dog "tinker" PT Goal Formulation: With patient Time For Goal Achievement: 01/02/18 Potential to Achieve Goals: Fair Progress towards PT goals: PT to reassess next treatment(Pt limited by pain)    Frequency    Min 2X/week      PT Plan Current plan remains appropriate       AM-PAC PT "6 Clicks" Daily Activity  Outcome Measure  Difficulty turning over in bed (including adjusting bedclothes, sheets and blankets)?: Unable Difficulty moving from lying on back to sitting on the side of the bed? : Unable Difficulty sitting down on and standing up from a chair with arms (e.g., wheelchair, bedside commode, etc,.)?: Unable Help needed moving to and from a bed to chair (including a wheelchair)?: Total Help needed walking in hospital room?: Total Help needed climbing 3-5 steps with a railing? : Total 6 Click Score: 6    End of Session Equipment Utilized During Treatment: Gait belt Activity Tolerance: Patient limited by pain;Patient limited by fatigue Patient left: in bed;with call bell/phone within reach;with nursing/sitter in room;with family/visitor present Nurse Communication: Mobility status PT  Visit Diagnosis: Unsteadiness on feet (R26.81);Other abnormalities of gait and mobility (R26.89);Muscle weakness (generalized) (M62.81);Difficulty in walking, not elsewhere  classified (R26.2);History of falling (Z91.81);Pain Pain - Right/Left: Left Pain - part of body: Ankle and joints of foot(L>R)     Time: 0156-1537 PT Time Calculation (min) (ACUTE ONLY): 10 min  Charges:  $Therapeutic Exercise: 8-22 mins                     Mikhaila Roh B. Migdalia Dk PT, DPT Acute Rehabilitation Services Pager [phone] Office [phone]    [place] 12/21/2017, 3:54 PM

## 2017-12-21 NOTE — Progress Notes (Signed)
Inpatient Diabetes Program Recommendations  AACE/ADA: New Consensus Statement on Inpatient Glycemic Control (2015)  Target Ranges:  Prepandial:   less than 140 mg/dL      Peak postprandial:   less than 180 mg/dL (1-2 hours)      Critically ill patients:  140 - 180 mg/dL   Lab Results  Component Value Date   GLUCAP 159 (H) 12/21/2017   HGBA1C 6.7 (H) 12/18/2017    Review of Glycemic Control Results for NYELI, HOLTMEYER (MRN [id number]) as of 12/21/2017 10:37  Ref. Range 12/20/2017 17:24 12/20/2017 22:49 12/21/2017 08:38  Glucose-Capillary Latest Ref Range: 70 - 99 mg/dL 292 (H) 297 (H) 159 (H)   Diabetes history: Type 2 DM Outpatient Diabetes medications: Lantus 25 units QHS, Glipizide 10 mg BID, Actos 30 mg QD Current orders for Inpatient glycemic control: Novolog 0-9 units TID, Lantus 25 units QHS  Inpatient Diabetes Program Recommendations:    If to remain inpatient:   Consider: -Adding Novolog 0-5 units QHS. - Adding Novolog 3 units TID (assuming that patient is consuming >50% of meals).  Thanks, Bronson Curb, MSN, RNC-OB Diabetes Coordinator [phone] (8a-5p)

## 2017-12-21 NOTE — Plan of Care (Signed)
  Problem: Clinical Measurements: Goal: Ability to maintain clinical measurements within normal limits will improve Outcome: Progressing   Problem: Nutrition: Goal: Adequate nutrition will be maintained Outcome: Progressing   

## 2017-12-21 NOTE — Progress Notes (Signed)
PROGRESS NOTE    Heather Petty  FBP:[id number] DOB: 1950-02-27 DOA: 12/17/2017 PCP: [name], MD   Brief Narrative:  67 year old with past medical history relevant for type 2 diabetes on insulin, hypertension, gout, hyperlipidemia, possible schizophrenia, chronic venous stasis who presented with subjective fevers, hyperglycemia and shortness of breath and found to have community-acquired pneumonia and AKI.   Assessment & Plan:   Principal Problem:   Paranoid schizophrenia ([place]) Active Problems:   CAP (community acquired pneumonia)   AKI (acute kidney injury) ([place])   DM2 (diabetes mellitus, type 2) ([place])   HTN (hypertension)   #) Community acquired pneumonia: Noted on x-ray however limited by body habitus.  Not seen on renal CT. - Continue IV ceftriaxone and azithromycin started 12/17/2017 - Blood cultures from 12/17/2017 no growth to date -Procalcitonin elevated at 1.26  #) Bilateral lower extremity edema/pain: Patient reports several years of bilateral lower extremity numbness and tingling and pain.  Suspect this is most likely related to her type 2 diabetes. -12/20/2017 DVT ultrasound negative -Start gabapentin 300 mg nightly -Hold furosemide 40 mg daily  #) Elevated LFTs/nonalcoholic steatohepatitis: Likely secondary to both antipsychotics as well as obesity. - acute hepatitis panel positive only for hep B IgM -Right upper quadrant ultrasound shows evidence of steatohepatitis  #) Anemia: Patient has evidence of hypoproliferative anemia of unclear etiology. -Iron studies show all of the evidence of inflammation, slightly low TIBC but normal iron and T sat -Vitamin B12 slightly low, start supplementation -Pending folate  #) AKI: Almost resolved -Hold nephrotoxins  #) Type 2 diabetes: -Continue glargine 25 units nightly -Sliding scale insulin, SHS - Hold glipizide 10 mg twice daily - Hold p.o. glitazone 30 mg daily  #) Hypertension/hyperlipidemia: -Hold  benazepril 20 mg daily -Continue HCTZ 12.5 mg daily - Continue pravastatin 40 mg daily   Fluids: Tolerating p.o. Electrodes: Monitor and supplement Nutrition: Carb restricted diet  Prophylaxis: Subcu heparin  Disposition: Per physical therapy needs skilled nursing facility  Full code   Consultants:   None  Procedures:   12/20/2017 bilateral lower extremity DVT ultrasound negative  Antimicrobials:   IV ceftriaxone and azithromycin started 12/17/2017   Subjective: Using Spanish interpreter patient reports that she is feeling better from a respiratory standpoint.  She denies any cough, chest pain, congestion, rhinorrhea, abdominal pain.  She agrees to have bilateral lower extremity pain which she reports is been ongoing for several months to years.  Objective: Vitals:   12/20/17 1350 12/20/17 2252 12/21/17 0413 12/21/17 0419  BP: (!) 125/51 (!) 174/79 (!) 170/72 136/68  Pulse: 82 96 92 84  Resp: (!) 26 16 17 18   Temp: 97.8 F (36.6 C) 98.4 F (36.9 C) 98.5 F (36.9 C) 97.7 F (36.5 C)  TempSrc: Oral Oral Oral Oral  SpO2: 100% 98% 99% 100%  Weight:      Height:        Intake/Output Summary (Last 24 hours) at 12/21/2017 1231 Last data filed at 12/21/2017 1000 Gross per 24 hour  Intake 780 ml  Output 1100 ml  Net -320 ml   Filed Weights   12/17/17 1706  Weight: (!) 136.1 kg    Examination:  General exam: Appears calm and comfortable  Respiratory system: Clear to auscultation. Respiratory effort normal. Cardiovascular system: Regular rate and rhythm, no murmurs. Gastrointestinal system: Soft, nondistended, no rebound or guarding, plus bowel sounds. Central nervous system: Alert and oriented.  Grossly intact, moving all extremities Extremities: Bilateral lower extremity edema Skin: Chronic lower extremity dermatitis  Psychiatry: Judgement and insight appear normal. Mood & affect appropriate.     Data Reviewed: I have personally reviewed following labs  and imaging studies  CBC: Recent Labs  Lab 12/17/17 1729 12/20/17 0236 12/21/17 0150  WBC 4.4 3.2* 3.5*  NEUTROABS 3.0  --   --   HGB 10.2* 8.6* 8.1*  HCT 32.2* 28.3* 25.6*  MCV 93.6 95.6 95.5  PLT 146* 169 338   Basic Metabolic Panel: Recent Labs  Lab 12/17/17 1729 12/17/17 2327 12/19/17 0121 12/20/17 0236 12/21/17 0150  NA 132* 135 137 138 138  K 3.9 3.9 4.3 4.3 4.4  CL 103 107 113* 114* 113*  CO2 19* 19* 19* 18* 20*  GLUCOSE 384* 301* 226* 260* 280*  BUN 62* 63* 55* 36* 27*  CREATININE 2.43* 2.32* 1.57* 1.09* 1.12*  CALCIUM 8.7* 8.3* 7.7* 8.0* 8.1*  MG  --   --   --  1.9  --    GFR: Estimated Creatinine Clearance: 64.3 mL/min (A) (by C-G formula based on SCr of 1.12 mg/dL (H)). Liver Function Tests: Recent Labs  Lab 12/17/17 1729 12/19/17 0121 12/20/17 0236 12/21/17 0150  AST 57* 131* 103* 104*  ALT 84* 113* 130* 140*  ALKPHOS 174* 232* 312* 323*  BILITOT 0.8 0.7 0.7 0.4  PROT 6.7 5.1* 5.8* 5.2*  ALBUMIN 3.0* 2.2* 2.2* 2.2*   Recent Labs  Lab 12/17/17 2043  LIPASE 54*   No results for input(s): AMMONIA in the last 168 hours. Coagulation Profile: No results for input(s): INR, PROTIME in the last 168 hours. Cardiac Enzymes: No results for input(s): CKTOTAL, CKMB, CKMBINDEX, TROPONINI in the last 168 hours. BNP (last 3 results) No results for input(s): PROBNP in the last 8760 hours. HbA1C: No results for input(s): HGBA1C in the last 72 hours. CBG: Recent Labs  Lab 12/20/17 1201 12/20/17 1724 12/20/17 2249 12/21/17 0838 12/21/17 1217  GLUCAP 194* 292* 297* 159* 198*   Lipid Profile: No results for input(s): CHOL, HDL, LDLCALC, TRIG, CHOLHDL, LDLDIRECT in the last 72 hours. Thyroid Function Tests: No results for input(s): TSH, T4TOTAL, FREET4, T3FREE, THYROIDAB in the last 72 hours. Anemia Panel: Recent Labs    12/21/17 0150  VITAMINB12 276  FERRITIN 1,642*  TIBC 181*  IRON 78  RETICCTPCT 1.3   Sepsis Labs: Recent Labs  Lab  12/17/17 1739 12/20/17 0236 12/21/17 0150  PROCALCITON  --  1.26 1.13  LATICACIDVEN 1.88  --   --     Recent Results (from the past 240 hour(s))  Blood culture (routine x 2)     Status: None (Preliminary result)   Collection Time: 12/17/17  8:43 PM  Result Value Ref Range Status   Specimen Description BLOOD BLOOD LEFT FOREARM  Final   Special Requests   Final    BOTTLES DRAWN AEROBIC AND ANAEROBIC Blood Culture results may not be optimal due to an inadequate volume of blood received in culture bottles   Culture   Final    NO GROWTH 4 DAYS Performed at [facility], [place] [address], [place], [place] [address]    Report Status PENDING  Incomplete  Blood culture (routine x 2)     Status: None (Preliminary result)   Collection Time: 12/17/17  8:43 PM  Result Value Ref Range Status   Specimen Description BLOOD LEFT HAND  Final   Special Requests   Final    BOTTLES DRAWN AEROBIC AND ANAEROBIC Blood Culture results may not be optimal due to an inadequate volume  of blood received in culture bottles   Culture   Final    NO GROWTH 4 DAYS Performed at [facility], [place] [address], [place], [place] [address]    Report Status PENDING  Incomplete  Culture, blood (routine x 2) Call MD if unable to obtain prior to antibiotics being given     Status: None (Preliminary result)   Collection Time: 12/17/17 11:27 PM  Result Value Ref Range Status   Specimen Description BLOOD LEFT HAND  Final   Special Requests   Final    BOTTLES DRAWN AEROBIC ONLY Blood Culture results may not be optimal due to an inadequate volume of blood received in culture bottles   Culture  Setup Time   Final    AEROBIC BOTTLE ONLY GRAM POSITIVE RODS CRITICAL RESULT CALLED TO, READ BACK BY AND VERIFIED WITH: L SEAY PHARMD 12/20/2218 JDW Performed at [facility], [place] [address], [place], [place] [address]    Culture GRAM POSITIVE RODS  Final   Report Status PENDING  Incomplete  Culture, blood (routine x  2) Call MD if unable to obtain prior to antibiotics being given     Status: None (Preliminary result)   Collection Time: 12/17/17 11:27 PM  Result Value Ref Range Status   Specimen Description BLOOD LEFT HAND  Final   Special Requests   Final    BOTTLES DRAWN AEROBIC ONLY Blood Culture results may not be optimal due to an inadequate volume of blood received in culture bottles   Culture   Final    NO GROWTH 3 DAYS Performed at [facility], [place] [address], [place], [place] [address]    Report Status PENDING  Incomplete  Culture, Urine     Status: None   Collection Time: 12/19/17 11:13 AM  Result Value Ref Range Status   Specimen Description URINE, CATHETERIZED  Final   Special Requests   Final    NONE Performed at [facility], [place] [address] [place], [place] [address]    Culture   Final    Multiple bacterial morphotypes present, none predominant. Suggest appropriate recollection if clinically indicated.   Report Status 12/20/2017 FINAL  Final         Radiology Studies: Vas [place] Lower Extremity Venous (dvt)  Result Date: 12/20/2017  Lower Venous Study Indications: Swelling.  Limitations: Body habitus and poor ultrasound/tissue interface. Performing Technologist: Oliver Hum RVT  Examination Guidelines: A complete evaluation includes B-mode imaging, spectral Doppler, color Doppler, and power Doppler as needed of all accessible portions of each vessel. Bilateral testing is considered an integral part of a complete examination. Limited examinations for reoccurring indications may be performed as noted.  Right Venous Findings: +---------+---------------+---------+-----------+----------+-------------------+          CompressibilityPhasicitySpontaneityPropertiesSummary             +---------+---------------+---------+-----------+----------+-------------------+ CFV      Full           Yes      Yes                                       +---------+---------------+---------+-----------+----------+-------------------+ SFJ      Full                                                             +---------+---------------+---------+-----------+----------+-------------------+  FV Prox  Full                                                             +---------+---------------+---------+-----------+----------+-------------------+ FV Mid                  Yes      Yes                                      +---------+---------------+---------+-----------+----------+-------------------+ FV Distal               Yes      Yes                                      +---------+---------------+---------+-----------+----------+-------------------+ PFV      Full                                                             +---------+---------------+---------+-----------+----------+-------------------+ POP      Full           Yes      Yes                                      +---------+---------------+---------+-----------+----------+-------------------+ PTV      Full                                                             +---------+---------------+---------+-----------+----------+-------------------+ PERO                                                  Unable to visualize +---------+---------------+---------+-----------+----------+-------------------+  Left Venous Findings: +---------+---------------+---------+-----------+----------+-------------------+          CompressibilityPhasicitySpontaneityPropertiesSummary             +---------+---------------+---------+-----------+----------+-------------------+ CFV      Full           Yes      Yes                                      +---------+---------------+---------+-----------+----------+-------------------+ SFJ      Full                                                              +---------+---------------+---------+-----------+----------+-------------------+  FV Prox  Full                                                             +---------+---------------+---------+-----------+----------+-------------------+ FV Mid   Full                                                             +---------+---------------+---------+-----------+----------+-------------------+ FV Distal                                             Unable to visualize +---------+---------------+---------+-----------+----------+-------------------+ PFV      Full                                                             +---------+---------------+---------+-----------+----------+-------------------+ POP      Full           Yes      Yes                                      +---------+---------------+---------+-----------+----------+-------------------+ PTV      Full                                                             +---------+---------------+---------+-----------+----------+-------------------+ PERO                                                  Unable to visualize +---------+---------------+---------+-----------+----------+-------------------+    Summary: Right: There is no evidence of deep vein thrombosis in the lower extremity. However, portions of this examination were limited- see technologist comments above. No cystic structure found in the popliteal fossa. Left: There is no evidence of deep vein thrombosis in the lower extremity. However, portions of this examination were limited- see technologist comments above. No cystic structure found in the popliteal fossa.  *See table(s) above for measurements and observations. Electronically signed by Monica Martinez MD on 12/20/2017 at 4:32:45 PM.    Final    US Abdomen Limited Ruq  Result Date: 12/19/2017 CLINICAL DATA:  67 year old female with abnormal LFTs. EXAM: ULTRASOUND ABDOMEN LIMITED RIGHT UPPER  QUADRANT COMPARISON:  CT Abdomen and Pelvis 12/17/2017. FINDINGS: Gallbladder: No gallstones or wall thickening visualized. No sonographic Murphy sign noted by sonographer. Common bile duct: Diameter: 6 millimeters, within normal limits. Liver: Echogenic liver (image 25). No discrete liver lesion. Portal vein is patent on color Doppler imaging with  normal direction of blood flow towards the liver. Other findings: Negative visible right kidney.  The IMPRESSION: Fatty liver disease.  Otherwise negative. Electronically Signed   By: Genevie Ann M.D.   On: 12/19/2017 15:21        Scheduled Meds: . azithromycin  500 mg Oral Q24H  . doxepin  25 mg Oral QHS  . enoxaparin (LOVENOX) injection  65 mg Subcutaneous Daily  . famotidine  20 mg Oral BID  . gabapentin  300 mg Oral QHS  . hydrochlorothiazide  12.5 mg Oral Daily  . insulin aspart  0-9 Units Subcutaneous TID WC  . insulin glargine  25 Units Subcutaneous QHS  . multivitamin with minerals  1 tablet Oral Daily  . pravastatin  40 mg Oral q1800  . QUEtiapine  25 mg Oral QHS  . risperiDONE  0.5 mg Oral BID  . Vitamin D (Ergocalciferol)  50,000 Units Oral Q Thu   Continuous Infusions: . cefTRIAXone (ROCEPHIN)  IV 1 g (12/20/17 2055)     LOS: 4 days    Time spent: [place], MD [facility]  If 7PM-7AM, please contact night-coverage [email or web address] Password [facility] 12/21/2017, 12:31 PM

## 2017-12-22 LAB — CULTURE, BLOOD (ROUTINE X 2)
Culture: NO GROWTH
Culture: NO GROWTH

## 2017-12-22 LAB — CBC
HCT: 28.8 % — ABNORMAL LOW (ref 36.0–46.0)
Hemoglobin: 8.5 g/dL — ABNORMAL LOW (ref 12.0–15.0)
MCH: 28.5 pg (ref 26.0–34.0)
MCHC: 29.5 g/dL — ABNORMAL LOW (ref 30.0–36.0)
MCV: 96.6 fL (ref 80.0–100.0)
Platelets: 141 10*3/uL — ABNORMAL LOW (ref 150–400)
RBC: 2.98 MIL/uL — ABNORMAL LOW (ref 3.87–5.11)
RDW: 16 % — ABNORMAL HIGH (ref 11.5–15.5)
WBC: 5.3 10*3/uL (ref 4.0–10.5)
nRBC: 0.6 % — ABNORMAL HIGH (ref 0.0–0.2)

## 2017-12-22 LAB — BASIC METABOLIC PANEL
Anion gap: 7 (ref 5–15)
BUN: 19 mg/dL (ref 8–23)
Calcium: 8.5 mg/dL — ABNORMAL LOW (ref 8.9–10.3)
Creatinine, Ser: 0.99 mg/dL (ref 0.44–1.00)
GFR calc Af Amer: >60 mL/min (ref >60)
GFR calc non Af Amer: 57 mL/min — ABNORMAL LOW (ref >60)
Glucose, Bld: 208 mg/dL — ABNORMAL HIGH (ref 70–99)
Sodium: 137 mmol/L (ref 135–145)

## 2017-12-22 LAB — GLUCOSE, CAPILLARY
GLUCOSE-CAPILLARY: 203 mg/dL — AB (ref 70–99)
GLUCOSE-CAPILLARY: 200 mg/dL — AB (ref 70–99)
GLUCOSE-CAPILLARY: 273 mg/dL — AB (ref 70–99)
Glucose-Capillary: 293 mg/dL — ABNORMAL HIGH (ref 70–99)

## 2017-12-22 LAB — BASIC METABOLIC PANEL WITH GFR
CO2: 19 mmol/L — ABNORMAL LOW (ref 22–32)
Chloride: 111 mmol/L (ref 98–111)
Potassium: 5.5 mmol/L — ABNORMAL HIGH (ref 3.5–5.1)

## 2017-12-22 LAB — PROCALCITONIN: Procalcitonin: 0.93 ng/mL

## 2017-12-22 NOTE — Progress Notes (Signed)
PROGRESS NOTE    Heather Petty  KGM:[id number] DOB: 1950-11-17 DOA: 12/17/2017 PCP: [name], MD   Brief Narrative:  67 year old with past medical history relevant for type 2 diabetes on insulin, hypertension, gout, hyperlipidemia, possible schizophrenia, chronic venous stasis who presented with subjective fevers, hyperglycemia and shortness of breath and found to have community-acquired pneumonia and AKI.   Assessment & Plan:   Principal Problem:   Paranoid schizophrenia ([place]) Active Problems:   CAP (community acquired pneumonia)   AKI (acute kidney injury) ([place])   DM2 (diabetes mellitus, type 2) ([place])   HTN (hypertension)   #) Community acquired pneumonia: Noted on x-ray however limited by body habitus.  Not seen on renal CT. - Continue IV ceftriaxone and azithromycin started 12/17/2017 - Blood cultures from 12/17/2017 no growth to date -Procalcitonin elevated but downtrending  #) Bilateral lower extremity edema/pain: Likely secondary to diabetic neuropathy -12/20/2017 DVT ultrasound negative -Continue gabapentin 300 mg nightly -Restart furosemide 40 mg daily  #) Elevated LFTs/nonalcoholic steatohepatitis: Likely secondary to both antipsychotics as well as obesity. - acute hepatitis panel positive only for hep A IgM -Right upper quadrant ultrasound shows evidence of steatohepatitis  #) Anemia: Patient has evidence of hypoproliferative anemia of unclear etiology. -Iron studies show all of the evidence of inflammation, slightly low TIBC but normal iron and T sat -Vitamin B12 slightly low, continue cyanocobalamin thousand micrograms daily -Pending folate  #) AKI: Almost resolved -Hold nephrotoxins  #) Type 2 diabetes: -Continue glargine 25 units nightly -Sliding scale insulin, SHS - Hold glipizide 10 mg twice daily - Hold p.o. glitazone 30 mg daily  #) Hypertension/hyperlipidemia: -Hold benazepril 20 mg daily -Continue HCTZ 12.5 mg daily - Continue  pravastatin 40 mg daily   Fluids: Tolerating p.o. Electrodes: Monitor and supplement Nutrition: Carb restricted diet  Prophylaxis: Subcu heparin  Disposition: Per physical therapy needs skilled nursing facility  Full code   Consultants:   None  Procedures:   12/20/2017 bilateral lower extremity DVT ultrasound negative  Antimicrobials:   IV ceftriaxone and azithromycin started 12/17/2017   Subjective: Patient is lying in bed sleeping.  She was not awoken this morning at her request.  Objective: Vitals:   12/21/17 0419 12/21/17 1341 12/21/17 2219 12/22/17 0557  BP: 136/68 (!) 126/48 132/61 (!) 147/60  Pulse: 84 88 85 81  Resp: 18 18 18 18   Temp: 97.7 F (36.5 C) 97.8 F (36.6 C) 99.2 F (37.3 C) 98.8 F (37.1 C)  TempSrc: Oral Oral Oral Oral  SpO2: 100% 100% 100% 100%  Weight:      Height:        Intake/Output Summary (Last 24 hours) at 12/22/2017 1022 Last data filed at 12/22/2017 0700 Gross per 24 hour  Intake 1160 ml  Output 1500 ml  Net -340 ml   Filed Weights   12/17/17 1706  Weight: (!) 136.1 kg    Examination:  General exam: Appears calm and comfortable  Respiratory system: Clear to auscultation. Respiratory effort normal. Cardiovascular system: Regular rate and rhythm, no murmurs. Gastrointestinal system: Soft, nondistended, no rebound or guarding, plus bowel sounds. Central nervous system: Alert and oriented.  Grossly intact, moving all extremities Extremities: Bilateral lower extremity edema Skin: Chronic lower extremity dermatitis Psychiatry: Judgement and insight appear normal. Mood & affect appropriate.     Data Reviewed: I have personally reviewed following labs and imaging studies  CBC: Recent Labs  Lab 12/17/17 1729 12/20/17 0236 12/21/17 0150 12/22/17 0526  WBC 4.4 3.2* 3.5* 5.3  NEUTROABS 3.0  --   --   --  HGB 10.2* 8.6* 8.1* 8.5*  HCT 32.2* 28.3* 25.6* 28.8*  MCV 93.6 95.6 95.5 96.6  PLT 146* 169 153 PENDING    Basic Metabolic Panel: Recent Labs  Lab 12/17/17 2327 12/19/17 0121 12/20/17 0236 12/21/17 0150 12/22/17 0526  NA 135 137 138 138 137  K 3.9 4.3 4.3 4.4 5.5*  CL 107 113* 114* 113* 111  CO2 19* 19* 18* 20* 19*  GLUCOSE 301* 226* 260* 280* 208*  BUN 63* 55* 36* 27* 19  CREATININE 2.32* 1.57* 1.09* 1.12* 0.99  CALCIUM 8.3* 7.7* 8.0* 8.1* 8.5*  MG  --   --  1.9  --   --    GFR: Estimated Creatinine Clearance: 72.7 mL/min (by C-G formula based on SCr of 0.99 mg/dL). Liver Function Tests: Recent Labs  Lab 12/17/17 1729 12/19/17 0121 12/20/17 0236 12/21/17 0150  AST 57* 131* 103* 104*  ALT 84* 113* 130* 140*  ALKPHOS 174* 232* 312* 323*  BILITOT 0.8 0.7 0.7 0.4  PROT 6.7 5.1* 5.8* 5.2*  ALBUMIN 3.0* 2.2* 2.2* 2.2*   Recent Labs  Lab 12/17/17 2043  LIPASE 54*   No results for input(s): AMMONIA in the last 168 hours. Coagulation Profile: No results for input(s): INR, PROTIME in the last 168 hours. Cardiac Enzymes: No results for input(s): CKTOTAL, CKMB, CKMBINDEX, TROPONINI in the last 168 hours. BNP (last 3 results) No results for input(s): PROBNP in the last 8760 hours. HbA1C: No results for input(s): HGBA1C in the last 72 hours. CBG: Recent Labs  Lab 12/21/17 0838 12/21/17 1217 12/21/17 1742 12/21/17 2138 12/22/17 0842  GLUCAP 159* 198* 276* 246* 203*   Lipid Profile: No results for input(s): CHOL, HDL, LDLCALC, TRIG, CHOLHDL, LDLDIRECT in the last 72 hours. Thyroid Function Tests: No results for input(s): TSH, T4TOTAL, FREET4, T3FREE, THYROIDAB in the last 72 hours. Anemia Panel: Recent Labs    12/21/17 0150  VITAMINB12 276  FERRITIN 1,642*  TIBC 181*  IRON 78  RETICCTPCT 1.3   Sepsis Labs: Recent Labs  Lab 12/17/17 1739 12/20/17 0236 12/21/17 0150 12/22/17 0526  PROCALCITON  --  1.26 1.13 0.93  LATICACIDVEN 1.88  --   --   --     Recent Results (from the past 240 hour(s))  Blood culture (routine x 2)     Status: None   Collection  Time: 12/17/17  8:43 PM  Result Value Ref Range Status   Specimen Description BLOOD BLOOD LEFT FOREARM  Final   Special Requests   Final    BOTTLES DRAWN AEROBIC AND ANAEROBIC Blood Culture results may not be optimal due to an inadequate volume of blood received in culture bottles   Culture   Final    NO GROWTH 5 DAYS Performed at [facility], [place] [address] [place], [place] [address]    Report Status 12/22/2017 FINAL  Final  Blood culture (routine x 2)     Status: None   Collection Time: 12/17/17  8:43 PM  Result Value Ref Range Status   Specimen Description BLOOD LEFT HAND  Final   Special Requests   Final    BOTTLES DRAWN AEROBIC AND ANAEROBIC Blood Culture results may not be optimal due to an inadequate volume of blood received in culture bottles   Culture   Final    NO GROWTH 5 DAYS Performed at [facility], [place] [address], [place],  [address]    Report Status 12/22/2017 FINAL  Final  Culture, blood (routine x 2)  Call MD if unable to obtain prior to antibiotics being given     Status: None (Preliminary result)   Collection Time: 12/17/17 11:27 PM  Result Value Ref Range Status   Specimen Description BLOOD LEFT HAND  Final   Special Requests   Final    BOTTLES DRAWN AEROBIC ONLY Blood Culture results may not be optimal due to an inadequate volume of blood received in culture bottles   Culture  Setup Time   Final    AEROBIC BOTTLE ONLY GRAM POSITIVE RODS CRITICAL RESULT CALLED TO, READ BACK BY AND VERIFIED WITH: L SEAY PHARMD 12/20/2218 JDW Performed at [facility], [address] [place], [place] [address]    Culture GRAM POSITIVE RODS  Final   Report Status PENDING  Incomplete  Culture, blood (routine x 2) Call MD if unable to obtain prior to antibiotics being given     Status: None (Preliminary result)   Collection Time: 12/17/17 11:27 PM  Result Value Ref Range Status   Specimen Description BLOOD LEFT HAND  Final   Special Requests   Final     BOTTLES DRAWN AEROBIC ONLY Blood Culture results may not be optimal due to an inadequate volume of blood received in culture bottles   Culture   Final    NO GROWTH 4 DAYS Performed at [facility], [place] [address] [place], [place] [address]    Report Status PENDING  Incomplete  Culture, Urine     Status: None   Collection Time: 12/19/17 11:13 AM  Result Value Ref Range Status   Specimen Description URINE, CATHETERIZED  Final   Special Requests   Final    NONE Performed at [facility], [place] [address], [place], [place] [address]    Culture   Final    Multiple bacterial morphotypes present, none predominant. Suggest appropriate recollection if clinically indicated.   Report Status 12/20/2017 FINAL  Final         Radiology Studies: No results found.      Scheduled Meds: . azithromycin  500 mg Oral Q24H  . doxepin  25 mg Oral QHS  . enoxaparin (LOVENOX) injection  65 mg Subcutaneous Daily  . famotidine  20 mg Oral BID  . gabapentin  300 mg Oral QHS  . hydrochlorothiazide  12.5 mg Oral Daily  . insulin aspart  0-9 Units Subcutaneous TID WC  . insulin glargine  25 Units Subcutaneous QHS  . multivitamin with minerals  1 tablet Oral Daily  . pravastatin  40 mg Oral q1800  . QUEtiapine  25 mg Oral QHS  . risperiDONE  0.5 mg Oral BID  . vitamin B-12  1,000 mcg Oral Daily  . Vitamin D (Ergocalciferol)  50,000 Units Oral Q Thu   Continuous Infusions: . cefTRIAXone (ROCEPHIN)  IV 1 g (12/21/17 2032)     LOS: 5 days    Time spent: [place], MD [facility]  If 7PM-7AM, please contact night-coverage [email or web address] Password [email or web address] 12/22/2017, 10:22 AM

## 2017-12-23 LAB — CBC
HCT: 26.9 % — ABNORMAL LOW (ref 36.0–46.0)
Hemoglobin: 7.9 g/dL — ABNORMAL LOW (ref 12.0–15.0)
MCH: 28.4 pg (ref 26.0–34.0)
MCHC: 29.4 g/dL — ABNORMAL LOW (ref 30.0–36.0)
MCV: 96.8 fL (ref 80.0–100.0)
Platelets: 193 K/uL (ref 150–400)
RBC: 2.78 MIL/uL — ABNORMAL LOW (ref 3.87–5.11)
RDW: 15.8 % — ABNORMAL HIGH (ref 11.5–15.5)
WBC: 3.7 10*3/uL — ABNORMAL LOW (ref 4.0–10.5)
nRBC: 0 % (ref 0.0–0.2)

## 2017-12-23 LAB — BASIC METABOLIC PANEL
BUN: 19 mg/dL (ref 8–23)
Chloride: 109 mmol/L (ref 98–111)
Glucose, Bld: 223 mg/dL — ABNORMAL HIGH (ref 70–99)
Potassium: 4.2 mmol/L (ref 3.5–5.1)

## 2017-12-23 LAB — FOLATE RBC
Folate, Hemolysate: 398.8 ng/mL
Folate, RBC: 1576 ng/mL (ref >498)
Hematocrit: 25.3 % — ABNORMAL LOW (ref 34.0–46.6)

## 2017-12-23 LAB — MAGNESIUM: Magnesium: 1.9 mg/dL (ref 1.7–2.4)

## 2017-12-23 LAB — BASIC METABOLIC PANEL WITH GFR
Anion gap: 7 (ref 5–15)
CO2: 22 mmol/L (ref 22–32)
Calcium: 8.3 mg/dL — ABNORMAL LOW (ref 8.9–10.3)
Creatinine, Ser: 0.95 mg/dL (ref 0.44–1.00)
GFR calc Af Amer: >60 mL/min (ref >60)
GFR calc non Af Amer: 60 mL/min — ABNORMAL LOW (ref >60)
Sodium: 138 mmol/L (ref 135–145)

## 2017-12-23 LAB — GLUCOSE, CAPILLARY
GLUCOSE-CAPILLARY: 195 mg/dL — AB (ref 70–99)
GLUCOSE-CAPILLARY: 279 mg/dL — AB (ref 70–99)
Glucose-Capillary: 205 mg/dL — ABNORMAL HIGH (ref 70–99)
Glucose-Capillary: 218 mg/dL — ABNORMAL HIGH (ref 70–99)

## 2017-12-23 MED ORDER — CYANOCOBALAMIN 1000 MCG PO TABS: 1000.0000 ug | ORAL_TABLET | Freq: Every day | ORAL | 0 refills | Status: AC

## 2017-12-23 MED ORDER — RISPERIDONE 0.5 MG PO TABS: 0.5000 mg | ORAL_TABLET | Freq: Two times a day (BID) | ORAL | 0 refills | Status: DC

## 2017-12-23 MED ORDER — INSULIN GLARGINE 100 UNIT/ML ~~LOC~~ SOLN: 20.0000 [IU] | Freq: Two times a day (BID) | SUBCUTANEOUS | 11 refills | Status: DC

## 2017-12-23 MED ORDER — GABAPENTIN 300 MG PO CAPS
300.0000 mg | ORAL_CAPSULE | Freq: Three times a day (TID) | ORAL | Status: DC
  Administered 2017-12-23 – 2017-12-24 (×5): 300 mg via ORAL
  Filled 2017-12-23 (×5): qty 1

## 2017-12-23 MED ORDER — INSULIN GLARGINE 100 UNIT/ML ~~LOC~~ SOLN
20.0000 [IU] | Freq: Two times a day (BID) | SUBCUTANEOUS | Status: DC
  Administered 2017-12-23 – 2017-12-24 (×3): 20 [IU] via SUBCUTANEOUS
  Filled 2017-12-23 (×4): qty 0.2

## 2017-12-23 MED ORDER — MAGNESIUM CITRATE PO SOLN: 1.0000 | Freq: Every day | ORAL | Status: DC | PRN

## 2017-12-23 MED ORDER — AMOXICILLIN-POT CLAVULANATE 875-125 MG PO TABS: 1.0000 | ORAL_TABLET | Freq: Two times a day (BID) | ORAL | 0 refills | Status: AC

## 2017-12-23 MED ORDER — AMOXICILLIN-POT CLAVULANATE 875-125 MG PO TABS
1.0000 | ORAL_TABLET | Freq: Two times a day (BID) | ORAL | Status: DC
  Administered 2017-12-23 – 2017-12-24 (×3): 1 via ORAL
  Filled 2017-12-23 (×3): qty 1

## 2017-12-23 MED ORDER — POLYETHYLENE GLYCOL 3350 17 G PO PACK
17.0000 g | PACK | Freq: Two times a day (BID) | ORAL | Status: DC
  Administered 2017-12-24: 17 g via ORAL
  Filled 2017-12-23: qty 1

## 2017-12-23 MED ORDER — BISACODYL 5 MG PO TBEC: 5.0000 mg | DELAYED_RELEASE_TABLET | Freq: Every day | ORAL | Status: DC | PRN

## 2017-12-23 MED ORDER — GABAPENTIN 600 MG PO TABS: 300.0000 mg | ORAL_TABLET | Freq: Three times a day (TID) | ORAL | 0 refills | Status: DC

## 2017-12-23 NOTE — Discharge Instructions (Signed)
Neuropata diabtica (Diabetic Neuropathy) La neuropata diabtica es una enfermedad o lesin nerviosa causada por la diabetes mellitus. Alrededor de la mitad de todas las personas que sufren diabetes mellitus tienen alguna forma de lesin nerviosa. Es ms frecuente en aquellas personas que han sufrido diabetes mellitus durante muchos aos y quienes generalmente no tienen buen control del nivel de Location manager en la sangre (glucosa). La neuropata diabtica es una complicacin comn de la diabetes mellitus. Hay tres tipos ms comunes de neuropata diiabtica y un cuarto tipo que es menos frecuente y menos comprendido:  Neuropata perifrica: Es el tipo ms frecuente de neuropata diabtica. Causa lesiones en los nervios de los pies y las piernas primero y finalmente en las manos y [employer].La lesin afecta la capacidad de sentir con el tacto.  Neuropata autonmica: este tipo causa lesiones en el sistema nervioso autnomo, que controla las siguientes funciones: ? Los latidos cardacos. ? La Firefighter. ? La presin arterial. ? La miccin. ? La digestin. ? La sudoracin. ? La funcin sexual.  Neuropata focal: la neuropata focal puede ser dolorosa e impredecible y ocurre con ms frecuencia en adultos mayores con diabetes mellitus. Implica un nervio especfico o un rea y con frecuencia aparece repentinamente. Generalmente no causa problemas a Barrister's clerk.  Neuropata diabtica proximal: tambin llamada neuropata lumbosacra, afecta los nervios de los muslos, las caderas, las nalgas o las piernas. Es ms frecuente en personas con diabetes mellitus tipo 2 y en hombre mayores. Se caracteriza por dolor debilitante, debilidad y atrofia, generalmente en los msculos de los muslos. CAUSAS La causa de la neuropata perifrica, autonmica y focal es la diabetes mellitus no controlada y los niveles elevados de glucosa. La causa de la neuropata diabtica proximal no se conoce. Sin embargo, se  considera que la causa es una inflamacin relacionada con los niveles no controlados de glucosa. SIGNOS Y SNTOMAS Neuropata perifrica La neuropata perifrica se desarrolla lentamente a lo largo del tiempo. Cuando los nervios de los pies y las piernas ya no funcionan puede haber:  Merchandiser, retail, sensacin pulstil o dolor en las piernas o los pies.  Incapacidad para sentir presin o dolor en el pie. Las consecuencias son: ? Callosidades gruesas en las zonas de presin. ? Escaras. ? lceras.  Deformidades en el pie.  Capacidad reducida para sentir los cambios de temperatura.  Debilidad muscular. Neuropata autnoma Los sntomas varan segn qu nervios estn afectados. Los sntomas pueden ser:  Problemas digestivos como: ? Ganas de vomitar (nuseas). ? Vmitos. ? Hinchazn. ? Estreimiento. ? Diarrea. ? Dolor abdominal.  Dificultad para orinar. Esto ocurre cuando se pierde la capacidad de sentir cuando la vejiga est llena. Los problemas incluyen: ? Prdida de orina (incontinencia). ? Imposibilidad de vaciar la vejiga completamente (retencin).  Latidos cardacos rpidos o irregulares (palpitaciones).  Descenso brusco de la presin arterial al ponerse de pie (hipotensin ortosttica). Al ponerse de pie puede sentir: ? Mareos. ? Debilidad. ? Desmayos.  En los hombres, imposibilidad de Scientist, forensic y Copy.  En las mujeres, sequedad vaginal y problemas de disminucin del deseo sexual y Programmer, systems.  Problemas con la regulacin de la Firefighter.  Aumento o disminucin de la sudoracin. Neuropata focal  Movimientos oculares anormales o alineacin ocular anormal de ambos ojos.  Debilidad en la mueca.  Pie cado. Esto da como resultado una incapacidad para levantar el pie adecuadamente y Marlyce Huge o movimientos del pie anormales.  Parlisis de un lado del rostro (parlisis de [place]).  Dolor en el  pecho o dolor abdominal. Neuropata radicular  plexual  Dolor repentino e intenso en la cadera, el muslo o las nalgas.  Debilidad y desgaste de los msculos del muslo.  Dificultad para levantarse desde una posicin de sentado.  Hinchazn abdominal.  Prdida de peso sin motivo (generalmente ms de 10 lb [4,5 kg]). DIAGNSTICO Neuropata perifrica Le controlarn los sentidos. Las pruebas de funcin sensorial pueden realizarse con:  Un ligero toque usando un monofilamento.  La vibracin con un diapasn.  Sensacin aguda con el pinchazo de Warehouse manager. Otras pruebas que ayudan a diagnosticar la neuropata son:  Velocidad de conduccin nerviosa. Este estudio controla la transmisin de la corriente Art therapist a travs del nervio.  Electromiograma. Muestra de qu modo los msculos responden a las seales elctricas transmitidas por los nervios que los rodean.  Prueba de sensibilidad cuantitativa. Se utiliza para evaluar de que [employer] nervios responden a las vibraciones y cambios de Scientist, forensic. Neuropata autnoma El diagnstico se basa en los sntomas informados. infrmele al mdico o al profesional de la salud si siente:  [employer].  Estreimiento.  Diarrea.  Dificultad o imposibilidad de Garment/textile technologist.  Imposibilidad de lograr o Copy. Podrn solicitarle otros estudios, por ejemplo:  Electrocardiograma o monitoreo Holter. Estos estudios muestran si hay problemas con la frecuencia y el ritmo cardacos.  Le harn radiografas. Neuropata focal El diagnstico se basa en los sntomas y en lo que el mdico encuentre durante el examen. Podrn solicitarle otros estudios. Pueden incluir:  Velocidad de conduccin nerviosa. Este estudio controla la transmisin de la corriente Art therapist a travs del nervio.  Electromiograma. Muestra de qu modo los msculos responden a las seales elctricas transmitidas por los nervios que los rodean.  Prueba de sensibilidad cuantitativa. Se utiliza para evaluar de que [employer] nervios  responden a las vibraciones y cambios de Scientist, forensic. Neuropataradicular plexual  Generalmente lo primero es Civil engineer, contracting otro motivo o problemas que pudieran ser la causa, ya que no hay exmenes para realizar este diagnstico.  Radiografa de la columna vertebral y la regin lumbar.  Puncin lumbar para descartar cncer.  Resonancia magntica para Freight forwarder lesiones. TRATAMIENTO Una vez que se produce una lesin nerviosa, no puede ser revertida. El objetivo del tratamiento es impedir que la enfermedad o la lesin nerviosa empeoren y afecten ms nervios. Controlar el nivel de glucosa en la sangre es la clave. [employer] de las personas con neuropata radicular plexual ven al menos una mejora parcial con el tiempo. Deber mantener su nivel de glucosa en sangre y el nivel HbA1c en los valores determinados por su mdico. Los factores que ayudan a Chief Technology Officer el nivel de glucosa en sangre son:  Control del nivel de Multimedia programmer.  Planificacin de los alimentos.  Actividad fsica.  Medicamentos para la diabetes. Con el tiempo, el mantener [employer] niveles de glucosa en la sangre ayuda a [employer] sntomas. En algunos casos, se indican analgsicos. INSTRUCCIONES PARA EL CUIDADO EN EL HOGAR:  No fume.  Mantenga su nivel de glucosa en sangre en el rango en el que usted y su mdico han determinado que es aceptable para usted.  Mantenga su nivel de glucosa en la sangre en el rango en el que usted y su mdico han determinado que es aceptable para usted.  Consuma una dieta bien balanceada.  [place]. Incluya ejercicios de equilibrio y entrenamiento de Pharmacologist.  Protjase los pies. ? Revise sus pies todos los das para Theme park manager,  cortaduras, ampollas o signos de infeccin. ? Use medias acolchadas y zapatos con buen apoyo. Si es necesario, use dispositivos ortopdicos. ? Revise con regularidad la parte de adentro de los zapatos para ver  si hay puntos desgastados. Asegrese de que no haya piedras u otros elementos adentro de los zapatos antes de ponrselos.  SOLICITE ATENCIN MDICA SI:  Siente ardor, sensacin pulstil o dolor en las piernas o los pies.  Tiene incapacidad para sentir presin o dolor en el pie.  Aparecen problemas digestivos como: ? Nuseas. ? Vmitos. ? Hinchazn. ? Estreimiento. ? Diarrea. ? Dolor abdominal.  Tiene dificultad para orinar como: ? incontinencia. ? Retencin.  Usted tiene palpitaciones.  Presenta hipotensin ortosttica. Al ponerse de pie puede sentir: ? Mareos. ? Debilidad. ? Desmayarse  No puede lograr tener ni mantener una ereccin (en los hombres).  En las mujeres, sequedad vaginal y problemas de disminucin del deseo sexual y Programmer, systems.  Siente un dolor [employer], las piernas o las nalgas.  Pierde peso de [place] inexplicable.  Esta informacin no tiene Marine scientist el consejo del mdico. Asegrese de hacerle al mdico cualquier pregunta que tenga. Document Released: 01/23/2005 Document Revised: 05/17/2015 Document Reviewed: 07/04/2012 Elsevier Interactive Patient Education  2017 [employer].

## 2017-12-23 NOTE — Discharge Summary (Signed)
Physician Discharge Summary  Heather Petty OYD:[id number] DOB: January 03, 1951 DOA: 12/17/2017  PCP: [name], MD  Admit date: 12/17/2017 Discharge date: 12/23/2017  Admitted From: Home Disposition: Home  Recommendations for Outpatient Follow-up:  1. Follow up with PCP in 1-2 weeks 2. Please obtain BMP/CBC in one week   Home Health: Yes, home health PT, RN, aide Equipment/Devices: None  Discharge Condition: stable CODE STATUS: FULL Diet recommendation: Heart Healthy / Carb Modified    Brief/Interim Summary:  #) Community-acquired pneumonia: Patient was admitted with shortness of breath and found to have community-acquired pneumonia.  She was treated with IV ceftriaxone and azithromycin and discharged with oral Augmentin.  She completed a total of 5 days of antibiotics.  Blood cultures were negative.  Patient was evaluated by physical therapy due to debility and recommended skilled nursing facility however the daughter did not like any of the options and opted to take [facility] home with [facility], OT, RN.  #) Chronic lower extremity edema/pain: Patient has been complaining of years of lower extremity edema and pain.  DVT ultrasound on 12/20/2017 was negative.  This was attributed to her likely diabetic neuropathy.  She was started on gabapentin with some improvement in pain.  Her home oral diuretics were restarted on discharge.  #) Elevated LFTs/[place]: Patient was noted to have elevated LFTs.  Acute hepatitis panel was only positive for hep A IgM.  Right upper quadrant ultrasound showed evidence of steatohepatitis.  Patient will follow-up as outpatient with the dietitian.  #) Type 2 diabetes: Patient's home glargine was increased to 20 units twice daily she required increasing doses of insulin.  She was maintained on sliding scale here.  She may restart her home oral glipizide pioglitazone.  #) AKI: Patient was admitted with mild AKI.  Her ACE inhibitor was held.  This  resolved on discharge.  #) Hypertension/hyperlipidemia: Patient's home benazepril was held in the setting of AKI.  She was continued on HCTZ and pravastatin.  She may restart her home ACE inhibitor on discharge.  #) Anemia: Patient has hypoproliferative anemia of unclear etiology.  Suspect some degree of anemia of chronic disease.  Patient's B12 was slightly low and she was started on oral oral cyanocobalamin supplementation.  #) Paranoid schizophrenia: Patient has a history of paranoid schizophrenia.  At the request of the daughter psychiatry was consulted and recommended starting patient on risperidone twice daily.  This was started on the patient.  Patient was felt to have capacity.  Discharge Diagnoses:  Principal Problem:   Paranoid schizophrenia ([place]) Active Problems:   CAP (community acquired pneumonia)   AKI (acute kidney injury) ([place])   DM2 (diabetes mellitus, type 2) ([place])   HTN (hypertension)    Discharge Instructions   Allergies as of 12/23/2017      Reactions   Peanut-containing Drug Products Other (See Comments)   Questionable?? May have outgrown it??      Medication List    STOP taking these medications   HYDROcodone-acetaminophen 5-325 MG tablet Commonly known as:  NORCO/VICODIN   indomethacin 50 MG capsule Commonly known as:  INDOCIN     TAKE these medications   acetaminophen 325 MG tablet Commonly known as:  TYLENOL Take 650 mg by mouth every 6 (six) hours as needed for mild pain.   amoxicillin-clavulanate 875-125 MG tablet Commonly known as:  AUGMENTIN Take 1 tablet by mouth every 12 (twelve) hours for 1 day.   benazepril 20 MG tablet Commonly known as:  LOTENSIN Take 20  mg by mouth daily.   cetirizine 10 MG tablet Commonly known as:  ZYRTEC Take 10 mg by mouth daily as needed for allergies.   cyanocobalamin 1000 MCG tablet Take 1 tablet (1,000 mcg total) by mouth daily. Start taking on:  12/24/2017   doxepin 25 MG capsule Commonly known  as:  SINEQUAN Take 25 mg by mouth 2 (two) times daily.   DYMISTA 137-50 MCG/ACT Susp Generic drug:  Azelastine-Fluticasone Place 1 spray into the nose as needed (allergies).   EPIPEN 2-PAK 0.3 mg/0.3 mL Soaj injection Generic drug:  EPINEPHrine Inject 0.3 mg into the muscle once. For severe allergic reaction   famotidine 20 MG tablet Commonly known as:  PEPCID Take 20 mg by mouth 2 (two) times daily.   furosemide 40 MG tablet Commonly known as:  LASIX Take 80 mg by mouth daily as needed (swelling).   gabapentin 600 MG tablet Commonly known as:  NEURONTIN Take 0.5 tablets (300 mg total) by mouth 3 (three) times daily.   glipiZIDE 10 MG 24 hr tablet Commonly known as:  GLUCOTROL XL Take 10 mg by mouth 2 (two) times daily.   hydrochlorothiazide 12.5 MG capsule Commonly known as:  MICROZIDE Take 12.5 mg by mouth daily.   insulin glargine 100 UNIT/ML injection Commonly known as:  LANTUS Inject 0.2 mLs (20 Units total) into the skin 2 (two) times daily. What changed:    how much to take  when to take this   meloxicam 7.5 MG tablet Commonly known as:  MOBIC Take 7.5 mg by mouth 2 (two) times daily as needed for pain.   multivitamin tablet Take 1 tablet by mouth daily.   pioglitazone 30 MG tablet Commonly known as:  ACTOS Take 30 mg by mouth daily.   potassium chloride 10 MEQ tablet Commonly known as:  K-DUR Take 10 mEq by mouth daily.   pravastatin 40 MG tablet Commonly known as:  PRAVACHOL Take 40 mg by mouth daily.   risperiDONE 0.5 MG tablet Commonly known as:  RISPERDAL Take 1 tablet (0.5 mg total) by mouth 2 (two) times daily.   traMADol 50 MG tablet Commonly known as:  ULTRAM Take 50 mg by mouth every 6 (six) hours as needed for moderate pain.   Vitamin D (Ergocalciferol) 1.25 MG (50000 UT) Caps capsule Commonly known as:  DRISDOL Take 50,000 Units by mouth every 7 (seven) days.       Allergies  Allergen Reactions  . Peanut-Containing Drug  Products Other (See Comments)    Questionable?? May have outgrown it??    Consultations:  None   Procedures/Studies: Dg Chest 2 View  Result Date: 12/17/2017 CLINICAL DATA:  67 year old female with a history of polyuria EXAM: CHEST - 2 VIEW COMPARISON:  12/27/2010, 09/28/2006 FINDINGS: Cardiomediastinal silhouette unchanged. No evidence of central vascular congestion. No pneumothorax or pleural effusion. Low lung volumes. Lateral view demonstrates new ill-defined opacity at the posterior lung base. No displaced fracture. IMPRESSION: Lateral view is suggestive of basilar pneumonia. Electronically Signed   By: Corrie Mckusick D.O.   On: 12/17/2017 18:10   Ct Renal Stone Study  Result Date: 12/17/2017 CLINICAL DATA:  Flank pain and polyuria EXAM: CT ABDOMEN AND PELVIS WITHOUT CONTRAST TECHNIQUE: Multidetector CT imaging of the abdomen and pelvis was performed following the standard protocol without IV contrast. COMPARISON:  None. FINDINGS: LOWER CHEST: 6 mm nodule of the right lung base. HEPATOBILIARY: Diffuse hypoattenuation of the liver relative to the spleen suggests hepatic steatosis. No focal liver lesion  or biliary dilatation. The gallbladder is normal. PANCREAS: The pancreatic parenchymal contours are normal and there is no ductal dilatation. There is no peripancreatic fluid collection. SPLEEN: Normal. ADRENALS/URINARY TRACT: --Adrenal glands: Normal. --Right kidney/ureter: No hydronephrosis, nephroureterolithiasis, perinephric stranding or solid renal mass. --Left kidney/ureter: No hydronephrosis, nephroureterolithiasis, perinephric stranding or solid renal mass. --Urinary bladder: Normal for degree of distention STOMACH/BOWEL: --Stomach/Duodenum: There is no hiatal hernia or other gastric abnormality. The duodenal course and caliber are normal. --Small bowel: No dilatation or inflammation. --Colon: No focal abnormality. --Appendix: Normal. VASCULAR/LYMPHATIC: Atherosclerotic calcification is  present within the non-aneurysmal abdominal aorta, without hemodynamically significant stenosis. No abdominal or pelvic lymphadenopathy. REPRODUCTIVE: Normal uterus and ovaries. MUSCULOSKELETAL. Multilevel degenerative disc disease and facet arthrosis. No bony spinal canal stenosis. OTHER: None. IMPRESSION: 1. No obstructive uropathy or nephrolithiasis. 2. Hepatic steatosis. 3.  Aortic atherosclerosis (ICD10-I70.0). 4. 6 mm right lung base pulmonary nodule. Non-contrast chest CT at 6-12 months is recommended. If the nodule is stable at time of repeat CT, then future CT at 18-24 months (from today's scan) is considered optional for low-risk patients, but is recommended for high-risk patients. This recommendation follows the consensus statement: Guidelines for Management of Incidental Pulmonary Nodules Detected on CT Images: From the Fleischner Society 2017; Radiology 2017; 284:228-243. Electronically Signed   By: Ulyses Jarred M.D.   On: 12/17/2017 19:55   Vas [place] Lower Extremity Venous (dvt)  Result Date: 12/20/2017  Lower Venous Study Indications: Swelling.  Limitations: Body habitus and poor ultrasound/tissue interface. Performing Technologist: Oliver Hum RVT  Examination Guidelines: A complete evaluation includes B-mode imaging, spectral Doppler, color Doppler, and power Doppler as needed of all accessible portions of each vessel. Bilateral testing is considered an integral part of a complete examination. Limited examinations for reoccurring indications may be performed as noted.  Right Venous Findings: +---------+---------------+---------+-----------+----------+-------------------+          CompressibilityPhasicitySpontaneityPropertiesSummary             +---------+---------------+---------+-----------+----------+-------------------+ CFV      Full           Yes      Yes                                      +---------+---------------+---------+-----------+----------+-------------------+  SFJ      Full                                                             +---------+---------------+---------+-----------+----------+-------------------+ FV Prox  Full                                                             +---------+---------------+---------+-----------+----------+-------------------+ FV Mid                  Yes      Yes                                      +---------+---------------+---------+-----------+----------+-------------------+ FV Distal  Yes      Yes                                      +---------+---------------+---------+-----------+----------+-------------------+ PFV      Full                                                             +---------+---------------+---------+-----------+----------+-------------------+ POP      Full           Yes      Yes                                      +---------+---------------+---------+-----------+----------+-------------------+ PTV      Full                                                             +---------+---------------+---------+-----------+----------+-------------------+ PERO                                                  Unable to visualize +---------+---------------+---------+-----------+----------+-------------------+  Left Venous Findings: +---------+---------------+---------+-----------+----------+-------------------+          CompressibilityPhasicitySpontaneityPropertiesSummary             +---------+---------------+---------+-----------+----------+-------------------+ CFV      Full           Yes      Yes                                      +---------+---------------+---------+-----------+----------+-------------------+ SFJ      Full                                                             +---------+---------------+---------+-----------+----------+-------------------+ FV Prox  Full                                                              +---------+---------------+---------+-----------+----------+-------------------+ FV Mid   Full                                                             +---------+---------------+---------+-----------+----------+-------------------+ FV Distal  Unable to visualize +---------+---------------+---------+-----------+----------+-------------------+ PFV      Full                                                             +---------+---------------+---------+-----------+----------+-------------------+ POP      Full           Yes      Yes                                      +---------+---------------+---------+-----------+----------+-------------------+ PTV      Full                                                             +---------+---------------+---------+-----------+----------+-------------------+ PERO                                                  Unable to visualize +---------+---------------+---------+-----------+----------+-------------------+    Summary: Right: There is no evidence of deep vein thrombosis in the lower extremity. However, portions of this examination were limited- see technologist comments above. No cystic structure found in the popliteal fossa. Left: There is no evidence of deep vein thrombosis in the lower extremity. However, portions of this examination were limited- see technologist comments above. No cystic structure found in the popliteal fossa.  *See table(s) above for measurements and observations. Electronically signed by Monica Martinez MD on 12/20/2017 at 4:32:45 PM.    Final    US Abdomen Limited Ruq  Result Date: 12/19/2017 CLINICAL DATA:  67 year old female with abnormal LFTs. EXAM: ULTRASOUND ABDOMEN LIMITED RIGHT UPPER QUADRANT COMPARISON:  CT Abdomen and Pelvis 12/17/2017. FINDINGS: Gallbladder: No gallstones or wall thickening visualized. No sonographic Murphy sign  noted by sonographer. Common bile duct: Diameter: 6 millimeters, within normal limits. Liver: Echogenic liver (image 25). No discrete liver lesion. Portal vein is patent on color Doppler imaging with normal direction of blood flow towards the liver. Other findings: Negative visible right kidney.  The IMPRESSION: Fatty liver disease.  Otherwise negative. Electronically Signed   By: Genevie Ann M.D.   On: 12/19/2017 15:21      Subjective:   Discharge Exam: Vitals:   12/22/17 2054 12/23/17 0534  BP: (!) 145/60 (!) 158/63  Pulse: 92 89  Resp: 18 18  Temp: 99.1 F (37.3 C) 98.1 F (36.7 C)  SpO2: 98% 99%   Vitals:   12/22/17 0557 12/22/17 1441 12/22/17 2054 12/23/17 0534  BP: (!) 147/60 (!) 141/59 (!) 145/60 (!) 158/63  Pulse: 81 87 92 89  Resp: 18  18 18   Temp: 98.8 F (37.1 C) 98.3 F (36.8 C) 99.1 F (37.3 C) 98.1 F (36.7 C)  TempSrc: Oral Oral Oral Oral  SpO2: 100% 100% 98% 99%  Weight:      Height:       General exam: Appears calm and comfortable  Respiratory system: Clear to auscultation. Respiratory effort normal. Cardiovascular  system: Regular rate and rhythm, no murmurs. Gastrointestinal system: Soft, nondistended, no rebound or guarding, plus bowel sounds. Central nervous system: Alert and oriented.  Grossly intact, moving all extremities Extremities: Bilateral lower extremity edema Skin: Chronic lower extremity dermatitis Psychiatry: Judgement and insight appear normal. Mood & affect appropriate.      The results of significant diagnostics from this hospitalization (including imaging, microbiology, ancillary and laboratory) are listed below for reference.     Microbiology: Recent Results (from the past 240 hour(s))  Blood culture (routine x 2)     Status: None   Collection Time: 12/17/17  8:43 PM  Result Value Ref Range Status   Specimen Description BLOOD BLOOD LEFT FOREARM  Final   Special Requests   Final    BOTTLES DRAWN AEROBIC AND ANAEROBIC Blood  Culture results may not be optimal due to an inadequate volume of blood received in culture bottles   Culture   Final    NO GROWTH 5 DAYS Performed at [facility], [place] [address] [place], [place] [address]    Report Status 12/22/2017 FINAL  Final  Blood culture (routine x 2)     Status: None   Collection Time: 12/17/17  8:43 PM  Result Value Ref Range Status   Specimen Description BLOOD LEFT HAND  Final   Special Requests   Final    BOTTLES DRAWN AEROBIC AND ANAEROBIC Blood Culture results may not be optimal due to an inadequate volume of blood received in culture bottles   Culture   Final    NO GROWTH 5 DAYS Performed at [facility], [place] [address], [place], [place] [address]    Report Status 12/22/2017 FINAL  Final  Culture, blood (routine x 2) Call MD if unable to obtain prior to antibiotics being given     Status: None (Preliminary result)   Collection Time: 12/17/17 11:27 PM  Result Value Ref Range Status   Specimen Description BLOOD LEFT HAND  Final   Special Requests   Final    BOTTLES DRAWN AEROBIC ONLY Blood Culture results may not be optimal due to an inadequate volume of blood received in culture bottles   Culture  Setup Time   Final    AEROBIC BOTTLE ONLY GRAM POSITIVE RODS CRITICAL RESULT CALLED TO, READ BACK BY AND VERIFIED WITH: L SEAY PHARMD 12/20/2218 JDW Performed at [facility], [place] [address] [place], [place] [address]    Culture GRAM POSITIVE RODS  Final   Report Status PENDING  Incomplete  Culture, blood (routine x 2) Call MD if unable to obtain prior to antibiotics being given     Status: None (Preliminary result)   Collection Time: 12/17/17 11:27 PM  Result Value Ref Range Status   Specimen Description BLOOD LEFT HAND  Final   Special Requests   Final    BOTTLES DRAWN AEROBIC ONLY Blood Culture results may not be optimal due to an inadequate volume of blood received in culture bottles   Culture   Final    NO GROWTH 4 DAYS Performed  at [facility], [place] [address] [place],  [address]    Report Status PENDING  Incomplete  Culture, Urine     Status: None   Collection Time: 12/19/17 11:13 AM  Result Value Ref Range Status   Specimen Description URINE, CATHETERIZED  Final   Special Requests   Final    NONE Performed at [facility], [place] [address]  [place], [place] [address]    Culture   Final    Multiple bacterial morphotypes present, none predominant. Suggest appropriate recollection if clinically indicated.   Report Status 12/20/2017 FINAL  Final     Labs: BNP (last 3 results) No results for input(s): BNP in the last 8760 hours. Basic Metabolic Panel: Recent Labs  Lab 12/19/17 0121 12/20/17 0236 12/21/17 0150 12/22/17 0526 12/23/17 0245  NA 137 138 138 137 138  K 4.3 4.3 4.4 5.5* 4.2  CL 113* 114* 113* 111 109  CO2 19* 18* 20* 19* 22  GLUCOSE 226* 260* 280* 208* 223*  BUN 55* 36* 27* 19 19  CREATININE 1.57* 1.09* 1.12* 0.99 0.95  CALCIUM 7.7* 8.0* 8.1* 8.5* 8.3*  MG  --  1.9  --   --  1.9   Liver Function Tests: Recent Labs  Lab 12/17/17 1729 12/19/17 0121 12/20/17 0236 12/21/17 0150  AST 57* 131* 103* 104*  ALT 84* 113* 130* 140*  ALKPHOS 174* 232* 312* 323*  BILITOT 0.8 0.7 0.7 0.4  PROT 6.7 5.1* 5.8* 5.2*  ALBUMIN 3.0* 2.2* 2.2* 2.2*   Recent Labs  Lab 12/17/17 2043  LIPASE 54*   No results for input(s): AMMONIA in the last 168 hours. CBC: Recent Labs  Lab 12/17/17 1729 12/20/17 0236 12/21/17 0150 12/22/17 0526 12/23/17 0245  WBC 4.4 3.2* 3.5* 5.3 3.7*  NEUTROABS 3.0  --   --   --   --   HGB 10.2* 8.6* 8.1* 8.5* 7.9*  HCT 32.2* 28.3* 25.6* 28.8* 26.9*  MCV 93.6 95.6 95.5 96.6 96.8  PLT 146* 169 153 141* 193   Cardiac Enzymes: No results for input(s): CKTOTAL, CKMB, CKMBINDEX, TROPONINI in the last 168 hours. BNP: Invalid input(s): POCBNP CBG: Recent Labs  Lab 12/22/17 0842 12/22/17 1205 12/22/17 1712 12/22/17 2052 12/23/17 0805  GLUCAP 203*  200* 273* 293* 205*   D-Dimer No results for input(s): DDIMER in the last 72 hours. Hgb A1c No results for input(s): HGBA1C in the last 72 hours. Lipid Profile No results for input(s): CHOL, HDL, LDLCALC, TRIG, CHOLHDL, LDLDIRECT in the last 72 hours. Thyroid function studies No results for input(s): TSH, T4TOTAL, T3FREE, THYROIDAB in the last 72 hours.  Invalid input(s): FREET3 Anemia work up Recent Labs    12/21/17 0150  VITAMINB12 276  FERRITIN 1,642*  TIBC 181*  IRON 78  RETICCTPCT 1.3   Urinalysis    Component Value Date/Time   COLORURINE YELLOW 12/19/2017 [place] 12/19/2017 1131   LABSPEC 1.014 12/19/2017 1131   PHURINE 5.0 12/19/2017 1131   GLUCOSEU 50 (A) 12/19/2017 1131   HGBUR NEGATIVE 12/19/2017 1131   BILIRUBINUR NEGATIVE 12/19/2017 1131   KETONESUR NEGATIVE 12/19/2017 1131   PROTEINUR NEGATIVE 12/19/2017 1131   UROBILINOGEN 0.2 12/27/2010 1858   NITRITE NEGATIVE 12/19/2017 1131   LEUKOCYTESUR NEGATIVE 12/19/2017 1131   Sepsis Labs Invalid input(s): PROCALCITONIN,  WBC,  LACTICIDVEN Microbiology Recent Results (from the past 240 hour(s))  Blood culture (routine x 2)     Status: None   Collection Time: 12/17/17  8:43 PM  Result Value Ref Range Status   Specimen Description BLOOD BLOOD LEFT FOREARM  Final   Special Requests   Final    BOTTLES DRAWN AEROBIC AND ANAEROBIC Blood Culture results may not be optimal due to an inadequate volume of blood received in culture bottles   Culture   Final    NO GROWTH 5 DAYS Performed at [facility], [address] [place], [place] [address]    Report Status 12/22/2017 FINAL  Final  Blood culture (routine x 2)     Status: None   Collection Time: 12/17/17  8:43 PM  Result Value Ref Range Status   Specimen Description BLOOD LEFT HAND  Final   Special Requests   Final    BOTTLES DRAWN AEROBIC AND ANAEROBIC Blood Culture results may not be optimal due to an inadequate volume of blood  received in culture bottles   Culture   Final    NO GROWTH 5 DAYS Performed at [facility], [place] [address], [place], [place] [address]    Report Status 12/22/2017 FINAL  Final  Culture, blood (routine x 2) Call MD if unable to obtain prior to antibiotics being given     Status: None (Preliminary result)   Collection Time: 12/17/17 11:27 PM  Result Value Ref Range Status   Specimen Description BLOOD LEFT HAND  Final   Special Requests   Final    BOTTLES DRAWN AEROBIC ONLY Blood Culture results may not be optimal due to an inadequate volume of blood received in culture bottles   Culture  Setup Time   Final    AEROBIC BOTTLE ONLY GRAM POSITIVE RODS CRITICAL RESULT CALLED TO, READ BACK BY AND VERIFIED WITH: L SEAY PHARMD 12/20/2218 JDW Performed at [facility], [place] [address] [place], [place] [address]    Culture GRAM POSITIVE RODS  Final   Report Status PENDING  Incomplete  Culture, blood (routine x 2) Call MD if unable to obtain prior to antibiotics being given     Status: None (Preliminary result)   Collection Time: 12/17/17 11:27 PM  Result Value Ref Range Status   Specimen Description BLOOD LEFT HAND  Final   Special Requests   Final    BOTTLES DRAWN AEROBIC ONLY Blood Culture results may not be optimal due to an inadequate volume of blood received in culture bottles   Culture   Final    NO GROWTH 4 DAYS Performed at [facility], [place] [address] [place], [place] [address]    Report Status PENDING  Incomplete  Culture, Urine     Status: None   Collection Time: 12/19/17 11:13 AM  Result Value Ref Range Status   Specimen Description URINE, CATHETERIZED  Final   Special Requests   Final    NONE Performed at [facility], [place] [address] [place], [place] [address]    Culture   Final    Multiple bacterial morphotypes present, none predominant. Suggest appropriate recollection if clinically indicated.   Report Status 12/20/2017 FINAL  Final     Time  coordinating discharge: 44  SIGNED:   Cristy Folks, MD  [facility] 12/23/2017, 11:28 AM  If 7PM-7AM, please contact night-coverage [email or web address] Password [email or web address]

## 2017-12-24 DIAGNOSIS — F200 Paranoid schizophrenia: Secondary | ICD-10-CM

## 2017-12-24 LAB — GLUCOSE, CAPILLARY
Glucose-Capillary: 177 mg/dL — ABNORMAL HIGH (ref 70–99)
Glucose-Capillary: 196 mg/dL — ABNORMAL HIGH (ref 70–99)

## 2017-12-24 LAB — CULTURE, BLOOD (ROUTINE X 2)

## 2017-12-24 MED ORDER — POLYETHYLENE GLYCOL 3350 17 G PO PACK: 17.0000 g | PACK | Freq: Two times a day (BID) | ORAL | 0 refills | Status: DC

## 2017-12-24 MED ORDER — BISACODYL 5 MG PO TBEC: 5.0000 mg | DELAYED_RELEASE_TABLET | Freq: Every day | ORAL | 0 refills | Status: DC | PRN

## 2017-12-24 NOTE — Discharge Summary (Signed)
Physician Discharge Summary  Heather Petty UQJ:[id number] DOB: 1950/02/08 DOA: 12/17/2017  PCP: [name], MD  Admit date: 12/17/2017 Discharge date: 12/24/2017  Admitted From: Home Disposition: Home Recommendations for Outpatient Follow-up:  1. Follow up with PCP in 1-2 weeks 2. Please obtain BMP/CBC in one week   Home Health: Home health PT, RN, aide Equipment/Devices: 3 and 1 walker and tub bench Discharge Condition stable CODE STATUS: Full code Diet recommendation: Heart healthy carbohydrate modified  Brief/Interim Summary: #) Community-acquired pneumonia: Patient was admitted with shortness of breath and found to have community-acquired pneumonia.  She was treated with IV ceftriaxone and azithromycin and discharged with oral Augmentin.  She completed a total of 5 days of antibiotics.  Blood cultures were negative.  Patient was evaluated by physical therapy due to debility and recommended skilled nursing facility however the daughter did not like any of the options and opted to take her home with [facility] PT, OT, RN.  #) Chronic lower extremity edema/pain: Patient has been complaining of years of lower extremity edema and pain.  DVT ultrasound on 12/20/2017 was negative.  This was attributed to her likely diabetic neuropathy.  She was started on gabapentin with some improvement in pain.  Her home oral diuretics were restarted on discharge.  #) Elevated LFTs/[place]: Patient was noted to have elevated LFTs.  Acute hepatitis panel was only positive for hep A IgM.  Right upper quadrant ultrasound showed evidence of steatohepatitis.  Patient will follow-up as outpatient with the dietitian.  #) Type 2 diabetes: Patient's home glargine was increased to 20 units twice daily she required increasing doses of insulin.  She was maintained on sliding scale here.  She may restart her home oral glipizide pioglitazone.  #) AKI: Patient was admitted with mild AKI.  Her ACE  inhibitor was held.  This resolved on discharge.  #) Hypertension/hyperlipidemia: Patient's home benazepril was held in the setting of AKI.  She was continued on HCTZ and pravastatin.  She may restart her home ACE inhibitor on discharge.  #) Anemia: Patient has hypoproliferative anemia of unclear etiology.  Suspect some degree of anemia of chronic disease.  Patient's B12 was slightly low and she was started on oral oral cyanocobalamin supplementation.  #) Paranoid schizophrenia: Patient has a history of paranoid schizophrenia.  At the request of the daughter psychiatry was consulted and recommended starting patient on risperidone twice daily.  This was started on the patient.  Patient was felt to have capacity.  She requires 24/7 care.  We requested that she consider skilled facility however her daughter has toward the facilities which were available to offer her bed and has declined them.  She agrees to take the patient home with full understanding of the possible complications of not providing 24/7 care for the patient.  Plans to increase the amount of time the aide is at the house and be available more for her mother.  Patient has reached maximal benefit of hospitalization.  Discharge diagnosis, prognosis, plans, follow-up, medications and treatments discussed with the patient(or responsible party) and is in agreement with the plans as described.  Patient is stable for discharge.  Discharge Diagnoses:  Principal Problem:   Paranoid schizophrenia ([place]) Active Problems:   CAP (community acquired pneumonia)   AKI (acute kidney injury) ([place])   DM2 (diabetes mellitus, type 2) ([place])   HTN (hypertension)    Discharge Instructions  Discharge Instructions    Call MD for:  difficulty breathing, headache or visual disturbances  Complete by:  As directed    Call MD for:  hives   Complete by:  As directed    Call MD for:  persistant nausea and vomiting   Complete by:  As directed    Call MD  for:  redness, tenderness, or signs of infection (pain, swelling, redness, odor or green/yellow discharge around incision site)   Complete by:  As directed    Call MD for:  redness, tenderness, or signs of infection (pain, swelling, redness, odor or green/yellow discharge around incision site)   Complete by:  As directed    Call MD for:  severe uncontrolled pain   Complete by:  As directed    Call MD for:  severe uncontrolled pain   Complete by:  As directed    Call MD for:  temperature >100.4   Complete by:  As directed    Diet - low sodium heart healthy   Complete by:  As directed    Diet - low sodium heart healthy   Complete by:  As directed    Discharge instructions   Complete by:  As directed    Please follow-up with your primary care doctor in 1 week.   Increase activity slowly   Complete by:  As directed    Increase activity slowly   Complete by:  As directed      Allergies as of 12/24/2017      Reactions   Peanut-containing Drug Products Other (See Comments)   Questionable?? May have outgrown it??      Medication List    STOP taking these medications   HYDROcodone-acetaminophen 5-325 MG tablet Commonly known as:  NORCO/VICODIN   indomethacin 50 MG capsule Commonly known as:  INDOCIN     TAKE these medications   acetaminophen 325 MG tablet Commonly known as:  TYLENOL Take 650 mg by mouth every 6 (six) hours as needed for mild pain.   amoxicillin-clavulanate 875-125 MG tablet Commonly known as:  AUGMENTIN Take 1 tablet by mouth every 12 (twelve) hours for 1 day.   benazepril 20 MG tablet Commonly known as:  LOTENSIN Take 20 mg by mouth daily.   bisacodyl 5 MG EC tablet Commonly known as:  DULCOLAX Take 1 tablet (5 mg total) by mouth daily as needed for moderate constipation.   cetirizine 10 MG tablet Commonly known as:  ZYRTEC Take 10 mg by mouth daily as needed for allergies.   cyanocobalamin 1000 MCG tablet Take 1 tablet (1,000 mcg total) by mouth  daily.   doxepin 25 MG capsule Commonly known as:  SINEQUAN Take 25 mg by mouth 2 (two) times daily.   DYMISTA 137-50 MCG/ACT Susp Generic drug:  Azelastine-Fluticasone Place 1 spray into the nose as needed (allergies).   EPIPEN 2-PAK 0.3 mg/0.3 mL Soaj injection Generic drug:  EPINEPHrine Inject 0.3 mg into the muscle once. For severe allergic reaction   famotidine 20 MG tablet Commonly known as:  PEPCID Take 20 mg by mouth 2 (two) times daily.   furosemide 40 MG tablet Commonly known as:  LASIX Take 80 mg by mouth daily as needed (swelling).   gabapentin 600 MG tablet Commonly known as:  NEURONTIN Take 0.5 tablets (300 mg total) by mouth 3 (three) times daily.   glipiZIDE 10 MG 24 hr tablet Commonly known as:  GLUCOTROL XL Take 10 mg by mouth 2 (two) times daily.   hydrochlorothiazide 12.5 MG capsule Commonly known as:  MICROZIDE Take 12.5 mg by mouth daily.  insulin glargine 100 UNIT/ML injection Commonly known as:  LANTUS Inject 0.2 mLs (20 Units total) into the skin 2 (two) times daily. What changed:    how much to take  when to take this   meloxicam 7.5 MG tablet Commonly known as:  MOBIC Take 7.5 mg by mouth 2 (two) times daily as needed for pain.   multivitamin tablet Take 1 tablet by mouth daily.   pioglitazone 30 MG tablet Commonly known as:  ACTOS Take 30 mg by mouth daily.   polyethylene glycol packet Commonly known as:  MIRALAX / GLYCOLAX Take 17 g by mouth 2 (two) times daily.   potassium chloride 10 MEQ tablet Commonly known as:  K-DUR Take 10 mEq by mouth daily.   pravastatin 40 MG tablet Commonly known as:  PRAVACHOL Take 40 mg by mouth daily.   risperiDONE 0.5 MG tablet Commonly known as:  RISPERDAL Take 1 tablet (0.5 mg total) by mouth 2 (two) times daily.   traMADol 50 MG tablet Commonly known as:  ULTRAM Take 50 mg by mouth every 6 (six) hours as needed for moderate pain.   Vitamin D (Ergocalciferol) 1.25 MG (50000 UT)  Caps capsule Commonly known as:  DRISDOL Take 50,000 Units by mouth every 7 (seven) days.            Durable Medical Equipment  (From admission, onward)         Start     Ordered   12/24/17 1229  DME 3-in-1  Once     12/24/17 1229   12/24/17 1229  DME tub bench  Once     12/24/17 1229   12/23/17 1140  DME 3-in-1  Once     12/23/17 1139   12/23/17 1140  DME tub bench  Once     12/23/17 [facility] Follow up.   Why:  Home health Contact information: [place] [address] [phone]          Allergies  Allergen Reactions  . Peanut-Containing Drug Products Other (See Comments)    Questionable?? May have outgrown it??    Consultations:  None   Procedures/Studies: Dg Chest 2 View  Result Date: 12/17/2017 CLINICAL DATA:  67 year old female with a history of polyuria EXAM: CHEST - 2 VIEW COMPARISON:  12/27/2010, 09/28/2006 FINDINGS: Cardiomediastinal silhouette unchanged. No evidence of central vascular congestion. No pneumothorax or pleural effusion. Low lung volumes. Lateral view demonstrates new ill-defined opacity at the posterior lung base. No displaced fracture. IMPRESSION: Lateral view is suggestive of basilar pneumonia. Electronically Signed   By: Corrie Mckusick D.O.   On: 12/17/2017 18:10   Ct Renal Stone Study  Result Date: 12/17/2017 CLINICAL DATA:  Flank pain and polyuria EXAM: CT ABDOMEN AND PELVIS WITHOUT CONTRAST TECHNIQUE: Multidetector CT imaging of the abdomen and pelvis was performed following the standard protocol without IV contrast. COMPARISON:  None. FINDINGS: LOWER CHEST: 6 mm nodule of the right lung base. HEPATOBILIARY: Diffuse hypoattenuation of the liver relative to the spleen suggests hepatic steatosis. No focal liver lesion or biliary dilatation. The gallbladder is normal. PANCREAS: The pancreatic parenchymal contours are normal and there is no ductal  dilatation. There is no peripancreatic fluid collection. SPLEEN: Normal. ADRENALS/URINARY TRACT: --Adrenal glands: Normal. --Right kidney/ureter: No hydronephrosis, nephroureterolithiasis, perinephric stranding or solid renal mass. --Left kidney/ureter: No hydronephrosis, nephroureterolithiasis, perinephric stranding or solid renal mass. --Urinary bladder: Normal  for degree of distention STOMACH/BOWEL: --Stomach/Duodenum: There is no hiatal hernia or other gastric abnormality. The duodenal course and caliber are normal. --Small bowel: No dilatation or inflammation. --Colon: No focal abnormality. --Appendix: Normal. VASCULAR/LYMPHATIC: Atherosclerotic calcification is present within the non-aneurysmal abdominal aorta, without hemodynamically significant stenosis. No abdominal or pelvic lymphadenopathy. REPRODUCTIVE: Normal uterus and ovaries. MUSCULOSKELETAL. Multilevel degenerative disc disease and facet arthrosis. No bony spinal canal stenosis. OTHER: None. IMPRESSION: 1. No obstructive uropathy or nephrolithiasis. 2. Hepatic steatosis. 3.  Aortic atherosclerosis (ICD10-I70.0). 4. 6 mm right lung base pulmonary nodule. Non-contrast chest CT at 6-12 months is recommended. If the nodule is stable at time of repeat CT, then future CT at 18-24 months (from today's scan) is considered optional for low-risk patients, but is recommended for high-risk patients. This recommendation follows the consensus statement: Guidelines for Management of Incidental Pulmonary Nodules Detected on CT Images: From the Fleischner Society 2017; Radiology 2017; 284:228-243. Electronically Signed   By: Ulyses Jarred M.D.   On: 12/17/2017 19:55   Vas [place] Lower Extremity Venous (dvt)  Result Date: 12/20/2017  Lower Venous Study Indications: Swelling.  Limitations: Body habitus and poor ultrasound/tissue interface. Performing Technologist: Oliver Hum RVT  Examination Guidelines: A complete evaluation includes B-mode imaging, spectral  Doppler, color Doppler, and power Doppler as needed of all accessible portions of each vessel. Bilateral testing is considered an integral part of a complete examination. Limited examinations for reoccurring indications may be performed as noted.  Right Venous Findings: +---------+---------------+---------+-----------+----------+-------------------+          CompressibilityPhasicitySpontaneityPropertiesSummary             +---------+---------------+---------+-----------+----------+-------------------+ CFV      Full           Yes      Yes                                      +---------+---------------+---------+-----------+----------+-------------------+ SFJ      Full                                                             +---------+---------------+---------+-----------+----------+-------------------+ FV Prox  Full                                                             +---------+---------------+---------+-----------+----------+-------------------+ FV Mid                  Yes      Yes                                      +---------+---------------+---------+-----------+----------+-------------------+ FV Distal               Yes      Yes                                      +---------+---------------+---------+-----------+----------+-------------------+  PFV      Full                                                             +---------+---------------+---------+-----------+----------+-------------------+ POP      Full           Yes      Yes                                      +---------+---------------+---------+-----------+----------+-------------------+ PTV      Full                                                             +---------+---------------+---------+-----------+----------+-------------------+ PERO                                                  Unable to visualize  +---------+---------------+---------+-----------+----------+-------------------+  Left Venous Findings: +---------+---------------+---------+-----------+----------+-------------------+          CompressibilityPhasicitySpontaneityPropertiesSummary             +---------+---------------+---------+-----------+----------+-------------------+ CFV      Full           Yes      Yes                                      +---------+---------------+---------+-----------+----------+-------------------+ SFJ      Full                                                             +---------+---------------+---------+-----------+----------+-------------------+ FV Prox  Full                                                             +---------+---------------+---------+-----------+----------+-------------------+ FV Mid   Full                                                             +---------+---------------+---------+-----------+----------+-------------------+ FV Distal                                             Unable to visualize +---------+---------------+---------+-----------+----------+-------------------+ PFV      Full                                                             +---------+---------------+---------+-----------+----------+-------------------+  POP      Full           Yes      Yes                                      +---------+---------------+---------+-----------+----------+-------------------+ PTV      Full                                                             +---------+---------------+---------+-----------+----------+-------------------+ PERO                                                  Unable to visualize +---------+---------------+---------+-----------+----------+-------------------+    Summary: Right: There is no evidence of deep vein thrombosis in the lower extremity. However, portions of this examination were limited- see  technologist comments above. No cystic structure found in the popliteal fossa. Left: There is no evidence of deep vein thrombosis in the lower extremity. However, portions of this examination were limited- see technologist comments above. No cystic structure found in the popliteal fossa.  *See table(s) above for measurements and observations. Electronically signed by Monica Martinez MD on 12/20/2017 at 4:32:45 PM.    Final    US Abdomen Limited Ruq  Result Date: 12/19/2017 CLINICAL DATA:  67 year old female with abnormal LFTs. EXAM: ULTRASOUND ABDOMEN LIMITED RIGHT UPPER QUADRANT COMPARISON:  CT Abdomen and Pelvis 12/17/2017. FINDINGS: Gallbladder: No gallstones or wall thickening visualized. No sonographic Murphy sign noted by sonographer. Common bile duct: Diameter: 6 millimeters, within normal limits. Liver: Echogenic liver (image 25). No discrete liver lesion. Portal vein is patent on color Doppler imaging with normal direction of blood flow towards the liver. Other findings: Negative visible right kidney.  The IMPRESSION: Fatty liver disease.  Otherwise negative. Electronically Signed   By: Genevie Ann M.D.   On: 12/19/2017 15:21      Subjective: Patient with no new complaints.  Has agreed to go home and home health information has been arranged.  We did request that she go to skilled facility but she has refused  Discharge Exam: Vitals:   12/23/17 2047 12/24/17 0413  BP: (!) 114/53 (!) 119/51  Pulse: 86 82  Resp: 16 16  Temp: 98.8 F (37.1 C) 98.9 F (37.2 C)  SpO2: 99% 98%   Vitals:   12/23/17 0534 12/23/17 1401 12/23/17 2047 12/24/17 0413  BP: (!) 158/63 (!) 149/64 (!) 114/53 (!) 119/51  Pulse: 89 81 86 82  Resp: 18 17 16 16   Temp: 98.1 F (36.7 C) 98.1 F (36.7 C) 98.8 F (37.1 C) 98.9 F (37.2 C)  TempSrc: Oral Oral Oral Oral  SpO2: 99% 100% 99% 98%  Weight:      Height:        General: Pt is alert, awake, not in acute distress Cardiovascular: RRR, S1/S2 +, no rubs,  no gallops Respiratory: CTA bilaterally, no wheezing, no rhonchi Abdominal: Soft, NT, ND, bowel sounds + Extremities: no edema, no cyanosis    The results of significant diagnostics from this hospitalization (including imaging, microbiology, ancillary and laboratory) are listed  below for reference.     Microbiology: Recent Results (from the past 240 hour(s))  Blood culture (routine x 2)     Status: None   Collection Time: 12/17/17  8:43 PM  Result Value Ref Range Status   Specimen Description BLOOD BLOOD LEFT FOREARM  Final   Special Requests   Final    BOTTLES DRAWN AEROBIC AND ANAEROBIC Blood Culture results may not be optimal due to an inadequate volume of blood received in culture bottles   Culture   Final    NO GROWTH 5 DAYS Performed at [facility], [place] [address], [place], [place] [address]    Report Status 12/22/2017 FINAL  Final  Blood culture (routine x 2)     Status: None   Collection Time: 12/17/17  8:43 PM  Result Value Ref Range Status   Specimen Description BLOOD LEFT HAND  Final   Special Requests   Final    BOTTLES DRAWN AEROBIC AND ANAEROBIC Blood Culture results may not be optimal due to an inadequate volume of blood received in culture bottles   Culture   Final    NO GROWTH 5 DAYS Performed at [facility], [place] [address] [place], [place] [address]    Report Status 12/22/2017 FINAL  Final  Culture, blood (routine x 2) Call MD if unable to obtain prior to antibiotics being given     Status: None (Preliminary result)   Collection Time: 12/17/17 11:27 PM  Result Value Ref Range Status   Specimen Description BLOOD LEFT HAND  Final   Special Requests   Final    BOTTLES DRAWN AEROBIC ONLY Blood Culture results may not be optimal due to an inadequate volume of blood received in culture bottles   Culture  Setup Time   Final    AEROBIC BOTTLE ONLY GRAM POSITIVE RODS CRITICAL RESULT CALLED TO, READ BACK BY AND VERIFIED WITH: L SEAY PHARMD 12/20/2218  JDW Performed at [facility], [place] [address], [place], [place] [address]    Culture GRAM POSITIVE RODS  Final   Report Status PENDING  Incomplete  Culture, blood (routine x 2) Call MD if unable to obtain prior to antibiotics being given     Status: None (Preliminary result)   Collection Time: 12/17/17 11:27 PM  Result Value Ref Range Status   Specimen Description BLOOD LEFT HAND  Final   Special Requests   Final    BOTTLES DRAWN AEROBIC ONLY Blood Culture results may not be optimal due to an inadequate volume of blood received in culture bottles   Culture   Final    NO GROWTH 4 DAYS Performed at [facility], [place] [address] [place], [place] [address]    Report Status PENDING  Incomplete  Culture, Urine     Status: None   Collection Time: 12/19/17 11:13 AM  Result Value Ref Range Status   Specimen Description URINE, CATHETERIZED  Final   Special Requests   Final    NONE Performed at [facility], [place] [address], [place], [place] [address]    Culture   Final    Multiple bacterial morphotypes present, none predominant. Suggest appropriate recollection if clinically indicated.   Report Status 12/20/2017 FINAL  Final     Labs: BNP (last 3 results) No results for input(s): BNP in the last 8760 hours. Basic Metabolic Panel: Recent Labs  Lab 12/19/17 0121 12/20/17 0236 12/21/17 0150 12/22/17 0526 12/23/17 0245  NA 137  138 138 137 138  K 4.3 4.3 4.4 5.5* 4.2  CL 113* 114* 113* 111 109  CO2 19* 18* 20* 19* 22  GLUCOSE 226* 260* 280* 208* 223*  BUN 55* 36* 27* 19 19  CREATININE 1.57* 1.09* 1.12* 0.99 0.95  CALCIUM 7.7* 8.0* 8.1* 8.5* 8.3*  MG  --  1.9  --   --  1.9   Liver Function Tests: Recent Labs  Lab 12/17/17 1729 12/19/17 0121 12/20/17 0236 12/21/17 0150  AST 57* 131* 103* 104*  ALT 84* 113* 130* 140*  ALKPHOS 174* 232* 312* 323*  BILITOT 0.8 0.7 0.7 0.4  PROT 6.7 5.1* 5.8* 5.2*  ALBUMIN 3.0* 2.2* 2.2* 2.2*   Recent Labs  Lab  12/17/17 2043  LIPASE 54*   No results for input(s): AMMONIA in the last 168 hours. CBC: Recent Labs  Lab 12/17/17 1729 12/20/17 0236 12/21/17 0150 12/22/17 0526 12/23/17 0245  WBC 4.4 3.2* 3.5* 5.3 3.7*  NEUTROABS 3.0  --   --   --   --   HGB 10.2* 8.6* 8.1* 8.5* 7.9*  HCT 32.2* 28.3* 25.6*  25.3* 28.8* 26.9*  MCV 93.6 95.6 95.5 96.6 96.8  PLT 146* 169 153 141* 193   Cardiac Enzymes: No results for input(s): CKTOTAL, CKMB, CKMBINDEX, TROPONINI in the last 168 hours. BNP: Invalid input(s): POCBNP CBG: Recent Labs  Lab 12/23/17 0805 12/23/17 1202 12/23/17 1703 12/23/17 2045 12/24/17 0806  GLUCAP 205* 195* 218* 279* 177*   D-Dimer No results for input(s): DDIMER in the last 72 hours. Hgb A1c No results for input(s): HGBA1C in the last 72 hours. Lipid Profile No results for input(s): CHOL, HDL, LDLCALC, TRIG, CHOLHDL, LDLDIRECT in the last 72 hours. Thyroid function studies No results for input(s): TSH, T4TOTAL, T3FREE, THYROIDAB in the last 72 hours.  Invalid input(s): FREET3 Anemia work up No results for input(s): VITAMINB12, FOLATE, FERRITIN, TIBC, IRON, RETICCTPCT in the last 72 hours. Urinalysis    Component Value Date/Time   COLORURINE YELLOW 12/19/2017 [place] 12/19/2017 1131   LABSPEC 1.014 12/19/2017 1131   PHURINE 5.0 12/19/2017 1131   GLUCOSEU 50 (A) 12/19/2017 1131   HGBUR NEGATIVE 12/19/2017 1131   BILIRUBINUR NEGATIVE 12/19/2017 1131   KETONESUR NEGATIVE 12/19/2017 1131   PROTEINUR NEGATIVE 12/19/2017 1131   UROBILINOGEN 0.2 12/27/2010 1858   NITRITE NEGATIVE 12/19/2017 1131   LEUKOCYTESUR NEGATIVE 12/19/2017 1131   Sepsis Labs Invalid input(s): PROCALCITONIN,  WBC,  LACTICIDVEN Microbiology Recent Results (from the past 240 hour(s))  Blood culture (routine x 2)     Status: None   Collection Time: 12/17/17  8:43 PM  Result Value Ref Range Status   Specimen Description BLOOD BLOOD LEFT FOREARM  Final   Special Requests    Final    BOTTLES DRAWN AEROBIC AND ANAEROBIC Blood Culture results may not be optimal due to an inadequate volume of blood received in culture bottles   Culture   Final    NO GROWTH 5 DAYS Performed at [facility], [place] [address] [place], [place] [address]    Report Status 12/22/2017 FINAL  Final  Blood culture (routine x 2)     Status: None   Collection Time: 12/17/17  8:43 PM  Result Value Ref Range Status   Specimen Description BLOOD LEFT HAND  Final   Special Requests   Final    BOTTLES DRAWN AEROBIC AND ANAEROBIC Blood Culture results may not be optimal due to an inadequate volume of  blood received in culture bottles   Culture   Final    NO GROWTH 5 DAYS Performed at [facility], [place] [address], [place], [place] [address]    Report Status 12/22/2017 FINAL  Final  Culture, blood (routine x 2) Call MD if unable to obtain prior to antibiotics being given     Status: None (Preliminary result)   Collection Time: 12/17/17 11:27 PM  Result Value Ref Range Status   Specimen Description BLOOD LEFT HAND  Final   Special Requests   Final    BOTTLES DRAWN AEROBIC ONLY Blood Culture results may not be optimal due to an inadequate volume of blood received in culture bottles   Culture  Setup Time   Final    AEROBIC BOTTLE ONLY GRAM POSITIVE RODS CRITICAL RESULT CALLED TO, READ BACK BY AND VERIFIED WITH: L SEAY PHARMD 12/20/2218 JDW Performed at [facility], [place] [address] [place], [place] [address]    Culture GRAM POSITIVE RODS  Final   Report Status PENDING  Incomplete  Culture, blood (routine x 2) Call MD if unable to obtain prior to antibiotics being given     Status: None (Preliminary result)   Collection Time: 12/17/17 11:27 PM  Result Value Ref Range Status   Specimen Description BLOOD LEFT HAND  Final   Special Requests   Final    BOTTLES DRAWN AEROBIC ONLY Blood Culture results may not be optimal due to an inadequate volume of blood received in culture  bottles   Culture   Final    NO GROWTH 4 DAYS Performed at [facility], [place] [address] [place], [place] [address]    Report Status PENDING  Incomplete  Culture, Urine     Status: None   Collection Time: 12/19/17 11:13 AM  Result Value Ref Range Status   Specimen Description URINE, CATHETERIZED  Final   Special Requests   Final    NONE Performed at [facility], [place] [address] [place], [place] [address]    Culture   Final    Multiple bacterial morphotypes present, none predominant. Suggest appropriate recollection if clinically indicated.   Report Status 12/20/2017 FINAL  Final     Time coordinating discharge: 45 minutes  SIGNED:   Lady Deutscher, MD  FACP [facility] 12/24/2017, 12:35 PM Pager   If 7PM-7AM, please contact night-coverage [email or web address] Password [email or web address]

## 2017-12-24 NOTE — Progress Notes (Signed)
Pt discharged home with daughter in stable condition after going over discharge teaching with no concerns voiced 

## 2017-12-24 NOTE — Care Management Note (Signed)
Case Management Note  Patient Details  Name: Heather Petty MRN: [id number] Date of Birth: 26-Oct-1950  Subjective/Objective:                    Action/Plan: Spoke to daughter [name] [phone] via phone. She went to [place] and prefers to take her mother home with home health. Patient lives alone , has an Engineer, production through [employer] Monday through Friday 2 pm to 5 pm, Christina visits her mother daily from 7 pm to 8 or 9 pm.   Read patient's PT note to Otho Perl understands her mother needed total assistance in ambulating and transfers. Explained home health will NOT be in the home for long periods of time. Christina voiced understanding. Margreta Journey still wants to take her mother home. Margreta Journey will look into increasing aide time at home.    Margreta Journey does want 3 in 1 and tub bench, which will be delivered to hospital room. Margreta Journey will transport her mother home.   DME ordered through [facility] and [facility] referral also given to [facility]. Expected Discharge Date:                  Expected Discharge Plan:  [place]  In-House Referral:  NA  Discharge planning Services  CM Consult  Post Acute Care Choice:  Home Health, Durable Medical Equipment Choice offered to:  Adult Children  DME Arranged:  3-N-1, Tub bench DME Agency:  [place] Arranged:  RN, PT [facility] Agency:  [place]  Status of Service:  Completed, signed off  If discussed at [place] of Stay Meetings, dates discussed:    Additional Comments:  Marilu Favre, RN 12/24/2017, 10:09 AM

## 2017-12-24 NOTE — Progress Notes (Signed)
Occupational Therapy Treatment Patient Details Name: Heather Petty MRN: [id number] DOB: 01/07/1951 Today's Date: 12/24/2017    History of present illness 67 y.o. female with medical history significant of DM2, HTN, HLD, gout as well as unmedicated schizophrenia.  Patient presents to the ED for evaluation of generalized fatigue, subjective fevers, hyperglycemia and dysuria with increased urinary frequency per patient Pt dx with AKI and chest x-rays revealed PNA. Pt noted to have some delusional conversations with staff.    OT comments  Pt currently limited to EOB transfer only with x3 attempts for sit<>stand elevated surface total +2 max (A). Pt could not tolerate and initiated but did not sustain the task. Pt return to supine positioning. Recommends SNF placement at this time due to inability to transfer with staff.   Follow Up Recommendations  SNF;Supervision/Assistance - 24 hour    Equipment Recommendations       Recommendations for Other Services      Precautions / Restrictions Precautions Precautions: Fall       Mobility Bed Mobility Overal bed mobility: Needs Assistance Bed Mobility: Rolling;Supine to Sit;Sit to Supine Rolling: Min assist   Supine to sit: Mod assist Sit to supine: +2 for physical assistance;Mod assist   General bed mobility comments: pt requires (A) to advance to EOB and (A) with bil LE to lay back on bed surface  Transfers Overall transfer level: Needs assistance   Transfers: Sit to/from Stand Sit to Stand: +2 physical assistance;Max assist         General transfer comment: attempted x3 without ability to static stand.    Balance                                           ADL either performed or assessed with clinical judgement   ADL Overall ADL's : Needs assistance/impaired             Lower Body Bathing: Total assistance;+2 for physical assistance Lower Body Bathing Details (indicate cue type and reason): pt  incontinent and lacks awareness. pt with purewick on arrival. pt able to lift bil L Eto (A) with peri care without any pain reported                       General ADL Comments: pt inconsistent with movement of BIL LE at EOB vs slide lying for peri care. pt without report of pain with tactile input in side lying but does report pain EOB sitting in the entire leg     Vision       Perception     Praxis      Cognition Arousal/Alertness: Awake/alert Behavior During Therapy: Flat affect Overall Cognitive Status: History of cognitive impairments - at baseline                                 General Comments: pt following all commands and accurately reports medication provided . RN called to request medication for pain for bil LE        Exercises     Shoulder Instructions       General Comments noted to have skin break down behind bil knees. pt with barrier cream applied and pillow case to help with skin folds.     Pertinent Vitals/ Pain       Pain  Assessment: Faces Faces Pain Scale: Hurts whole lot Pain Location: BIL LE R foot >L foot with attempts to transfer Pain Descriptors / Indicators: Guarding;Grimacing Pain Intervention(s): Monitored during session;Repositioned  Home Living                                          Prior Functioning/Environment              Frequency  Min 2X/week        Progress Toward Goals  OT Goals(current goals can now be found in the care plan section)  Progress towards OT goals: Not progressing toward goals - comment(unable to exit the bed)  Acute Rehab OT Goals Patient Stated Goal: none stated OT Goal Formulation: With patient Time For Goal Achievement: 01/02/18 Potential to Achieve Goals: Good ADL Goals Pt Will Perform Grooming: with set-up;with supervision;sitting Pt Will Perform Upper Body Bathing: with min guard assist;sitting Pt Will Perform Lower Body Bathing: with mod  assist;sitting/lateral leans Pt Will Perform Upper Body Dressing: with min guard assist;sitting Pt Will Perform Lower Body Dressing: with mod assist;sitting/lateral leans Pt/caregiver will Perform Home Exercise Program: Increased strength;Increased ROM;Both right and left upper extremity;With written HEP provided;With Supervision Additional ADL Goal #1: Pt will perform bed mobility at minA level as precursor to EOB/OOB ADL.  Plan Discharge plan remains appropriate    Co-evaluation                 AM-PAC PT "6 Clicks" Daily Activity     Outcome Measure   Help from another person eating meals?: None Help from another person taking care of personal grooming?: A Little Help from another person toileting, which includes using toliet, bedpan, or urinal?: Total Help from another person bathing (including washing, rinsing, drying)?: A Lot Help from another person to put on and taking off regular upper body clothing?: A Lot Help from another person to put on and taking off regular lower body clothing?: Total 6 Click Score: 13    End of Session Equipment Utilized During Treatment: Oxygen  OT Visit Diagnosis: Muscle weakness (generalized) (M62.81)   Activity Tolerance Patient limited by pain   Patient Left in bed;with call bell/phone within reach   Nurse Communication Mobility status;Precautions        Time: 5465-6812 OT Time Calculation (min): 39 min  Charges: OT General Charges $OT Visit: 1 Visit OT Treatments $Self Care/Home Management : 23-37 mins   Jeri Modena, OTR/L  Acute Rehabilitation Services Pager: [phone] Office: [phone] .    Jeri Modena 12/24/2017, 10:29 AM

## 2017-12-24 NOTE — Social Work (Signed)
CSW acknowledging pt daughter is declining all SNF offers, would like to take pt home with home health. CSW signing off. Please consult if any additional needs arise.   Alexander Mt, [place] Work [phone]

## 2017-12-25 LAB — CULTURE, BLOOD (ROUTINE X 2): CULTURE: NO GROWTH

## 2018-02-05 ENCOUNTER — Other Ambulatory Visit: Payer: Self-pay

## 2018-02-05 ENCOUNTER — Encounter (HOSPITAL_COMMUNITY): Payer: Self-pay | Admitting: Emergency Medicine

## 2018-02-05 ENCOUNTER — Emergency Department (HOSPITAL_COMMUNITY): Payer: Medicare Other

## 2018-02-05 ENCOUNTER — Inpatient Hospital Stay (HOSPITAL_COMMUNITY): Payer: Medicare Other

## 2018-02-05 ENCOUNTER — Inpatient Hospital Stay (HOSPITAL_COMMUNITY)
Admission: EM | Admit: 2018-02-05 | Discharge: 2018-02-08 | DRG: 683 | Disposition: A | Discharge status: Other Acute Inpatient Hospital | Payer: Medicare Other | Attending: Internal Medicine | Admitting: Internal Medicine

## 2018-02-05 DIAGNOSIS — Z87891 Personal history of nicotine dependence: Secondary | ICD-10-CM | POA: Diagnosis not present

## 2018-02-05 DIAGNOSIS — F200 Paranoid schizophrenia: Secondary | ICD-10-CM | POA: Diagnosis present

## 2018-02-05 DIAGNOSIS — L309 Dermatitis, unspecified: Secondary | ICD-10-CM | POA: Diagnosis present

## 2018-02-05 DIAGNOSIS — E875 Hyperkalemia: Secondary | ICD-10-CM | POA: Diagnosis present

## 2018-02-05 DIAGNOSIS — E1169 Type 2 diabetes mellitus with other specified complication: Secondary | ICD-10-CM | POA: Diagnosis present

## 2018-02-05 DIAGNOSIS — I119 Hypertensive heart disease without heart failure: Secondary | ICD-10-CM | POA: Diagnosis present

## 2018-02-05 DIAGNOSIS — N179 Acute kidney failure, unspecified: Secondary | ICD-10-CM | POA: Diagnosis present

## 2018-02-05 DIAGNOSIS — F329 Major depressive disorder, single episode, unspecified: Secondary | ICD-10-CM | POA: Diagnosis present

## 2018-02-05 DIAGNOSIS — Z6841 Body Mass Index (BMI) 40.0 and over, adult: Secondary | ICD-10-CM | POA: Diagnosis not present

## 2018-02-05 DIAGNOSIS — R300 Dysuria: Secondary | ICD-10-CM

## 2018-02-05 DIAGNOSIS — L89899 Pressure ulcer of other site, unspecified stage: Secondary | ICD-10-CM

## 2018-02-05 DIAGNOSIS — R197 Diarrhea, unspecified: Secondary | ICD-10-CM | POA: Diagnosis present

## 2018-02-05 DIAGNOSIS — E860 Dehydration: Secondary | ICD-10-CM | POA: Diagnosis present

## 2018-02-05 DIAGNOSIS — E7800 Pure hypercholesterolemia, unspecified: Secondary | ICD-10-CM | POA: Diagnosis present

## 2018-02-05 DIAGNOSIS — Z79899 Other long term (current) drug therapy: Secondary | ICD-10-CM

## 2018-02-05 DIAGNOSIS — E785 Hyperlipidemia, unspecified: Secondary | ICD-10-CM | POA: Diagnosis present

## 2018-02-05 DIAGNOSIS — I10 Essential (primary) hypertension: Secondary | ICD-10-CM | POA: Diagnosis present

## 2018-02-05 DIAGNOSIS — G8929 Other chronic pain: Secondary | ICD-10-CM | POA: Diagnosis present

## 2018-02-05 DIAGNOSIS — S21209A Unspecified open wound of unspecified back wall of thorax without penetration into thoracic cavity, initial encounter: Secondary | ICD-10-CM

## 2018-02-05 DIAGNOSIS — Z91010 Allergy to peanuts: Secondary | ICD-10-CM | POA: Diagnosis not present

## 2018-02-05 DIAGNOSIS — D631 Anemia in chronic kidney disease: Secondary | ICD-10-CM | POA: Diagnosis present

## 2018-02-05 DIAGNOSIS — R627 Adult failure to thrive: Secondary | ICD-10-CM | POA: Diagnosis present

## 2018-02-05 DIAGNOSIS — Z794 Long term (current) use of insulin: Secondary | ICD-10-CM | POA: Diagnosis not present

## 2018-02-05 DIAGNOSIS — J45909 Unspecified asthma, uncomplicated: Secondary | ICD-10-CM | POA: Diagnosis present

## 2018-02-05 DIAGNOSIS — M1990 Unspecified osteoarthritis, unspecified site: Secondary | ICD-10-CM | POA: Diagnosis present

## 2018-02-05 DIAGNOSIS — L8990 Pressure ulcer of unspecified site, unspecified stage: Secondary | ICD-10-CM

## 2018-02-05 DIAGNOSIS — M109 Gout, unspecified: Secondary | ICD-10-CM | POA: Diagnosis present

## 2018-02-05 DIAGNOSIS — E1129 Type 2 diabetes mellitus with other diabetic kidney complication: Secondary | ICD-10-CM

## 2018-02-05 DIAGNOSIS — L98499 Non-pressure chronic ulcer of skin of other sites with unspecified severity: Secondary | ICD-10-CM

## 2018-02-05 LAB — COMPREHENSIVE METABOLIC PANEL
ALT: 52 U/L — ABNORMAL HIGH (ref 0–44)
AST: 43 U/L — ABNORMAL HIGH (ref 15–41)
Albumin: 2.6 g/dL — ABNORMAL LOW (ref 3.5–5.0)
Alkaline Phosphatase: 276 U/L — ABNORMAL HIGH (ref 38–126)
Anion gap: 16 — ABNORMAL HIGH (ref 5–15)
BUN: 80 mg/dL — ABNORMAL HIGH (ref 8–23)
CO2: 16 mmol/L — AB (ref 22–32)
Calcium: 8.9 mg/dL (ref 8.9–10.3)
Chloride: 107 mmol/L (ref 98–111)
Creatinine, Ser: 3.74 mg/dL — ABNORMAL HIGH (ref 0.44–1.00)
GFR calc non Af Amer: 12 mL/min — ABNORMAL LOW (ref >60)
GFR, EST AFRICAN AMERICAN: 14 mL/min — AB (ref >60)
Glucose, Bld: 167 mg/dL — ABNORMAL HIGH (ref 70–99)
Potassium: 5.6 mmol/L — ABNORMAL HIGH (ref 3.5–5.1)
Sodium: 139 mmol/L (ref 135–145)
Total Bilirubin: 0.9 mg/dL (ref 0.3–1.2)
Total Protein: 5.4 g/dL — ABNORMAL LOW (ref 6.5–8.1)

## 2018-02-05 LAB — CBC WITH DIFFERENTIAL/PLATELET
ABS IMMATURE GRANULOCYTES: 0 10*3/uL (ref 0.00–0.07)
BASOS PCT: 0 %
Basophils Absolute: 0 10*3/uL (ref 0.0–0.1)
Eosinophils Absolute: 1.2 10*3/uL — ABNORMAL HIGH (ref 0.0–0.5)
Eosinophils Relative: 14 %
HCT: 29.3 % — ABNORMAL LOW (ref 36.0–46.0)
Hemoglobin: 9.2 g/dL — ABNORMAL LOW (ref 12.0–15.0)
Lymphocytes Relative: 8 %
Lymphs Abs: 0.7 10*3/uL (ref 0.7–4.0)
MCH: 31.2 pg (ref 26.0–34.0)
MCHC: 31.4 g/dL (ref 30.0–36.0)
MCV: 99.3 fL (ref 80.0–100.0)
Monocytes Absolute: 0.3 10*3/uL (ref 0.1–1.0)
Monocytes Relative: 3 %
NEUTROS ABS: 6.7 10*3/uL (ref 1.7–7.7)
Neutrophils Relative %: 75 %
Platelets: 229 10*3/uL (ref 150–400)
RBC: 2.95 MIL/uL — ABNORMAL LOW (ref 3.87–5.11)
RDW: 16.4 % — ABNORMAL HIGH (ref 11.5–15.5)
WBC: 8.9 10*3/uL (ref 4.0–10.5)
nRBC: 0 % (ref 0.0–0.2)
nRBC: 0 /100 WBC

## 2018-02-05 LAB — I-STAT CG4 LACTIC ACID, ED
Lactic Acid, Venous: 2.6 mmol/L (ref 0.5–1.9)
Lactic Acid, Venous: 1.5 mmol/L (ref 0.5–1.9)

## 2018-02-05 LAB — I-STAT TROPONIN, ED: Troponin i, poc: 0.01 ng/mL (ref 0.00–0.08)

## 2018-02-05 LAB — GLUCOSE, CAPILLARY: Glucose-Capillary: 106 mg/dL — ABNORMAL HIGH (ref 70–99)

## 2018-02-05 MED ORDER — SODIUM CHLORIDE 0.9 % IV BOLUS
1000.0000 mL | Freq: Once | INTRAVENOUS | Status: AC
  Administered 2018-02-05: 1000 mL via INTRAVENOUS

## 2018-02-05 MED ORDER — CETAPHIL MOISTURIZING EX LOTN
1.0000 "application " | TOPICAL_LOTION | Freq: Two times a day (BID) | CUTANEOUS | Status: DC
  Administered 2018-02-05 – 2018-02-08 (×6): 1 via TOPICAL
  Filled 2018-02-05: qty 473

## 2018-02-05 MED ORDER — HEPARIN SODIUM (PORCINE) 5000 UNIT/ML IJ SOLN
5000.0000 [IU] | Freq: Three times a day (TID) | INTRAMUSCULAR | Status: DC
  Administered 2018-02-05 – 2018-02-08 (×10): 5000 [IU] via SUBCUTANEOUS
  Filled 2018-02-05 (×11): qty 1

## 2018-02-05 MED ORDER — SODIUM CHLORIDE 0.9 % IV SOLN
INTRAVENOUS | Status: AC
  Administered 2018-02-05: 23:00:00 via INTRAVENOUS

## 2018-02-05 MED ORDER — DOXEPIN HCL 25 MG PO CAPS
25.0000 mg | ORAL_CAPSULE | Freq: Two times a day (BID) | ORAL | Status: DC
  Administered 2018-02-05 – 2018-02-08 (×7): 25 mg via ORAL
  Filled 2018-02-05 (×9): qty 1

## 2018-02-05 MED ORDER — FAMOTIDINE 20 MG PO TABS
20.0000 mg | ORAL_TABLET | Freq: Every day | ORAL | Status: DC
  Administered 2018-02-05 – 2018-02-08 (×4): 20 mg via ORAL
  Filled 2018-02-05 (×4): qty 1

## 2018-02-05 MED ORDER — SODIUM CHLORIDE 0.9 % IV SOLN
2.0000 g | INTRAVENOUS | Status: DC
  Administered 2018-02-05 – 2018-02-07 (×3): 2 g via INTRAVENOUS
  Filled 2018-02-05 (×3): qty 20

## 2018-02-05 MED ORDER — SODIUM CHLORIDE 0.9 % IV BOLUS (SEPSIS)
1000.0000 mL | Freq: Once | INTRAVENOUS | Status: AC
  Administered 2018-02-05: 1000 mL via INTRAVENOUS

## 2018-02-05 MED ORDER — ACETAMINOPHEN 650 MG RE SUPP: 650.0000 mg | Freq: Four times a day (QID) | RECTAL | Status: DC | PRN

## 2018-02-05 MED ORDER — BISACODYL 5 MG PO TBEC: 5.0000 mg | DELAYED_RELEASE_TABLET | Freq: Every day | ORAL | Status: DC | PRN

## 2018-02-05 MED ORDER — FAMOTIDINE 20 MG PO TABS: 20.0000 mg | ORAL_TABLET | Freq: Two times a day (BID) | ORAL | Status: DC

## 2018-02-05 MED ORDER — INSULIN GLARGINE 100 UNIT/ML ~~LOC~~ SOLN
10.0000 [IU] | Freq: Two times a day (BID) | SUBCUTANEOUS | Status: DC
  Administered 2018-02-05 – 2018-02-08 (×7): 10 [IU] via SUBCUTANEOUS
  Filled 2018-02-05 (×8): qty 0.1

## 2018-02-05 MED ORDER — RISPERIDONE 0.5 MG PO TABS
0.5000 mg | ORAL_TABLET | Freq: Two times a day (BID) | ORAL | Status: DC
  Administered 2018-02-05 – 2018-02-08 (×7): 0.5 mg via ORAL
  Filled 2018-02-05 (×8): qty 1

## 2018-02-05 MED ORDER — HYDRALAZINE HCL 20 MG/ML IJ SOLN: 10.0000 mg | Freq: Four times a day (QID) | INTRAMUSCULAR | Status: DC | PRN

## 2018-02-05 MED ORDER — GABAPENTIN 300 MG PO CAPS
300.0000 mg | ORAL_CAPSULE | Freq: Three times a day (TID) | ORAL | Status: DC
  Administered 2018-02-05 – 2018-02-08 (×10): 300 mg via ORAL
  Filled 2018-02-05 (×10): qty 1

## 2018-02-05 MED ORDER — INSULIN ASPART 100 UNIT/ML ~~LOC~~ SOLN
0.0000 [IU] | Freq: Three times a day (TID) | SUBCUTANEOUS | Status: DC
  Administered 2018-02-06 (×2): 1 [IU] via SUBCUTANEOUS
  Administered 2018-02-07: 2 [IU] via SUBCUTANEOUS
  Administered 2018-02-08: 1 [IU] via SUBCUTANEOUS

## 2018-02-05 MED ORDER — TRAMADOL HCL 50 MG PO TABS
50.0000 mg | ORAL_TABLET | Freq: Four times a day (QID) | ORAL | Status: DC | PRN
  Administered 2018-02-05: 50 mg via ORAL
  Filled 2018-02-05: qty 1

## 2018-02-05 MED ORDER — PRAVASTATIN SODIUM 40 MG PO TABS
40.0000 mg | ORAL_TABLET | Freq: Every day | ORAL | Status: DC
  Administered 2018-02-05 – 2018-02-08 (×4): 40 mg via ORAL
  Filled 2018-02-05 (×4): qty 1

## 2018-02-05 MED ORDER — ACETAMINOPHEN 325 MG PO TABS: 650.0000 mg | ORAL_TABLET | Freq: Four times a day (QID) | ORAL | Status: DC | PRN

## 2018-02-05 MED ORDER — SODIUM CHLORIDE 0.9% FLUSH
3.0000 mL | Freq: Two times a day (BID) | INTRAVENOUS | Status: DC
  Administered 2018-02-05 – 2018-02-08 (×4): 3 mL via INTRAVENOUS

## 2018-02-05 NOTE — Evaluation (Signed)
Ms. Jacyln Carmer is a 67 year old female with HTN and DM who presents to ED for general pain secondary to skin and weakness. In the ED, patient was found to be hypotensive and was given 1L IVF bolus x 3. PCCM consulted for hypotension. On my exam, patient awake and alert, follows commands. Vitals reviewed and BP 106/50 after fluid resuscitation. No indication for pressors. Does not require ICU care at this time.

## 2018-02-05 NOTE — H&P (Signed)
History and Physical    Heather Petty ZOX:[id number] DOB: [birth year] DOA: 02/05/2018  PCP: [name], MD  Patient coming from: Home  I have personally briefly reviewed patient's old medical records in [place]  Chief Complaint: Dehydration, fatigue  HPI: Heather Petty is a 67 y.o. female with medical history significant for T2DM, HTN, HLD, Asthma, Anemia, Depression/Schizophrenia who presents the ED from home with reported progressive failure to thrive with poor oral intake ongoing over the last 3 weeks.  She was admitted from 12/17/2017-12/24/2017 for community-acquired pneumonia and AKI.  She says since that time she has had continued cough with occasional production of white sputum.  She reports generalized fatigue and dry flaky skin all over her body.  She reports good urine output but has dysuria.  Prior to discharge it was noted that she required 24/7 care however her daughter declined skilled nursing facility placement and opted to bring her mother home with increase of amount of time of home aide.  ED Course:  Initial vitals showed BP 130/94, pulse 98, RR 20, temp 97.9 Fahrenheit, SPO2 98% on room air.  Labs are notable for WBC 8.9, hemoglobin 9.2 (7.9 on 12/23/2017), platelets 229, K 5.6, BUN 80, creatinine 3.74 (BUN/creatinine 19/0.95 on 12/23/2017).  AST, 43, ALT 52, alk phos 276 all improved from prior. I-STAT troponin 0.01.  Lactic acid 2.6 improved to 1.5 with fluids.  2 view chest x-ray showed cardiomegaly with no acute infiltrate, consolidation, or effusion.  Patient was given 3 L of normal saline continue borderline hypotension.  Critical care were consulted and patient was noted to be alert and oriented with stable BP 106/50 without indication for pressors therefore ICU admission was deferred.  Blood cultures were drawn and patient was started on IV ceftriaxone.  Urinalysis was ordered but has not been obtained yet.  The hospitalist service was consulted  to admit for further management of AKI.  Review of Systems: As per HPI otherwise 10 point review of systems negative.    Past Medical History:  Diagnosis Date  . AKI (acute kidney injury) ([place]) 12/17/2017  . Arthritis   . Asthma   . Depression   . Diabetes mellitus   . Gout   . High cholesterol   . Hypertension   . Morbid obesity ([place])   . Pneumonia 12/17/2017  . Schizophrenia [facility])     Past Surgical History:  Procedure Laterality Date  . CYST EXCISION    . DENTAL SURGERY    . TONSILLECTOMY       reports that she has quit smoking. She has never used smokeless tobacco. She reports that she does not drink alcohol or use drugs.  Allergies  Allergen Reactions  . Peanut-Containing Drug Products Other (See Comments)    Questionable?? May have outgrown it??    Family History  Family history unknown: Yes     Prior to Admission medications   Medication Sig Start Date End Date Taking? Authorizing Provider  acetaminophen (TYLENOL) 325 MG tablet Take 650 mg by mouth every 6 (six) hours as needed for mild pain.    Yes [provider]  allopurinol (ZYLOPRIM) 300 MG tablet Take 300 mg by mouth daily.   Yes [provider]  Azelastine-Fluticasone (DYMISTA) 137-50 MCG/ACT SUSP Place 1 spray into the nose as needed (allergies).   Yes [provider]  benazepril (LOTENSIN) 20 MG tablet Take 20 mg by mouth daily.   Yes [provider]  bisacodyl (DULCOLAX) 5 MG EC tablet Take  1 tablet (5 mg total) by mouth daily as needed for moderate constipation. 12/24/17  Yes Lady Deutscher, MD  cetirizine (ZYRTEC) 10 MG tablet Take 10 mg by mouth daily as needed for allergies.   Yes [provider]  doxepin (SINEQUAN) 25 MG capsule Take 25 mg by mouth 2 (two) times daily.    Yes [provider]  EPINEPHrine (EPIPEN 2-PAK) 0.3 mg/0.3 mL IJ SOAJ injection Inject 0.3 mg into the muscle once. For severe allergic reaction   Yes [provider]  famotidine (PEPCID) 20 MG tablet Take 20 mg by mouth 2 (two) times daily.     Yes [provider]  furosemide (LASIX) 40 MG tablet Take 80 mg by mouth daily as needed (swelling).    Yes [provider]  gabapentin (NEURONTIN) 600 MG tablet Take 0.5 tablets (300 mg total) by mouth 3 (three) times daily. 12/23/17 02/05/18 Yes Purohit, Konrad Dolores, MD  glipiZIDE (GLUCOTROL XL) 10 MG 24 hr tablet Take 10 mg by mouth 2 (two) times daily.     Yes [provider]  hydrochlorothiazide (MICROZIDE) 12.5 MG capsule Take 12.5 mg by mouth daily. 12/03/17  Yes [provider]  insulin glargine (LANTUS) 100 UNIT/ML injection Inject 0.2 mLs (20 Units total) into the skin 2 (two) times daily. 12/23/17  Yes Purohit, Konrad Dolores, MD  meloxicam (MOBIC) 7.5 MG tablet Take 7.5 mg by mouth 2 (two) times daily as needed for pain.    Yes [provider]  Multiple Vitamin (MULTIVITAMIN) tablet Take 1 tablet by mouth daily.   Yes [provider]  pioglitazone (ACTOS) 30 MG tablet Take 30 mg by mouth daily.     Yes [provider]  polyethylene glycol (MIRALAX / GLYCOLAX) packet Take 17 g by mouth 2 (two) times daily. 12/24/17  Yes Lady Deutscher, MD  potassium chloride (K-DUR) 10 MEQ tablet Take 10 mEq by mouth daily. 12/03/17  Yes [provider]  pravastatin (PRAVACHOL) 40 MG tablet Take 40 mg by mouth daily.   Yes [provider]  risperiDONE (RISPERDAL) 0.5 MG tablet Take 1 tablet (0.5 mg total) by mouth 2 (two) times daily. 12/23/17 02/05/18 Yes Purohit, Konrad Dolores, MD  traMADol (ULTRAM) 50 MG tablet Take 50 mg by mouth every 6 (six) hours as needed for moderate pain.   Yes [provider]  Vitamin D, Ergocalciferol, (DRISDOL) 1.25 MG (50000 UT) CAPS capsule Take 50,000 Units by mouth every 7 (seven) days.   Yes [provider]    Physical Exam: Vitals:   02/05/18 1813 02/05/18 1845 02/05/18 1900 02/05/18 1948    BP: (!) 108/43 (!) 102/45 (!) 102/55 (!) 152/94  Pulse: (!) 101 96 97 (!) 104  Resp: _0 (!) 22  Temp:      TempSrc:      SpO2: 100% 99% 99% 100%    Constitutional: Morbidly obese woman lying flat in bed, somewhat anxious and uncomfortable Eyes: PERRL, lids and conjunctivae normal ENMT: Mucous membranes are dry. Posterior pharynx clear of any exudate or lesions. Neck: normal, supple, no masses. Respiratory: clear to auscultation anteriorly, no wheezing, no crackles. Normal respiratory effort. No accessory muscle use.  Cardiovascular: Regular rate and rhythm, no murmurs / rubs / gallops.  +1 pitting edema both legs.   Abdomen: Mild suprapubic tenderness, no masses palpated. No hepatosplenomegaly. Musculoskeletal: No joint deformity upper and lower extremities. No contractures. Normal muscle tone.  Skin: Diffusely dry scaly/flaky skin.  Wound on  back as pictured below. Neurologic: CN 2-12 grossly intact. Sensation intact.  Moving all extremities spontaneously.Marland Kitchen  Psychiatric: Somewhat anxious. Alert and oriented x 3.        Labs on Admission: I have personally reviewed following labs and imaging studies  CBC: Recent Labs  Lab 02/05/18 1121  WBC 8.9  NEUTROABS 6.7  HGB 9.2*  HCT 29.3*  MCV 99.3  PLT 269   Basic Metabolic Panel: Recent Labs  Lab 02/05/18 1121  NA 139  K 5.6*  CL 107  CO2 16*  GLUCOSE 167*  BUN 80*  CREATININE 3.74*  CALCIUM 8.9   GFR: CrCl cannot be calculated (Unknown ideal weight.). Liver Function Tests: Recent Labs  Lab 02/05/18 1121  AST 43*  ALT 52*  ALKPHOS 276*  BILITOT 0.9  PROT 5.4*  ALBUMIN 2.6*   No results for input(s): LIPASE, AMYLASE in the last 168 hours. No results for input(s): AMMONIA in the last 168 hours. Coagulation Profile: No results for input(s): INR, PROTIME in the last 168 hours. Cardiac Enzymes: No results for input(s): CKTOTAL, CKMB, CKMBINDEX, TROPONINI in the last 168 hours. BNP (last 3 results) No  results for input(s): PROBNP in the last 8760 hours. HbA1C: No results for input(s): HGBA1C in the last 72 hours. CBG: No results for input(s): GLUCAP in the last 168 hours. Lipid Profile: No results for input(s): CHOL, HDL, LDLCALC, TRIG, CHOLHDL, LDLDIRECT in the last 72 hours. Thyroid Function Tests: No results for input(s): TSH, T4TOTAL, FREET4, T3FREE, THYROIDAB in the last 72 hours. Anemia Panel: No results for input(s): VITAMINB12, FOLATE, FERRITIN, TIBC, IRON, RETICCTPCT in the last 72 hours. Urine analysis:    Component Value Date/Time   COLORURINE YELLOW 12/19/2017 [place] 12/19/2017 1131   LABSPEC 1.014 12/19/2017 1131   PHURINE 5.0 12/19/2017 1131   GLUCOSEU 50 (A) 12/19/2017 1131   HGBUR NEGATIVE 12/19/2017 1131   BILIRUBINUR NEGATIVE 12/19/2017 1131   KETONESUR NEGATIVE 12/19/2017 1131   PROTEINUR NEGATIVE 12/19/2017 1131   UROBILINOGEN 0.2 12/27/2010 1858   NITRITE NEGATIVE 12/19/2017 1131   LEUKOCYTESUR NEGATIVE 12/19/2017 1131    Radiological Exams on Admission: Dg Chest 2 View  Result Date: 02/05/2018 CLINICAL DATA:  Cough, fever EXAM: CHEST - 2 VIEW COMPARISON:  12/17/2017 FINDINGS: Cardiomegaly. No confluent airspace opacities, effusions or edema. No acute bony abnormality. IMPRESSION: Cardiomegaly.  No active disease. Electronically Signed   By: Rolm Baptise M.D.   On: 02/05/2018 12:00    EKG: Independently reviewed.  Sinus tachycardia, low voltage, no significant change from prior.  Assessment/Plan Principal Problem:   Acute renal failure (ARF) (HCC) Active Problems:   DM2 (diabetes mellitus, type 2) (HCC)   HTN (hypertension)   Paranoid schizophrenia ([place])   Hyperlipidemia associated with type 2 diabetes mellitus ([place])   Hyperkalemia   Dermatitis   Open back wound   Dysuria   Heather Petty is a 67 y.o. female with medical history significant for T2DM, HTN, HLD, Asthma, Anemia, Depression/Schizophrenia who presents the ED  from home with reported progressive failure to thrive with poor oral intake ongoing over the last 3 weeks admitted with acute renal failure.  Acute renal failure: BUN 80 and creatinine 3.74 compared to normal function on 12/23/2017.  Suspect related to medication use (benazepril, Lasix, HCTZ, meloxicam) with progressive uremia causing poor oral intake and dehydration. -s/p 3 L NS -Start maintenance IV fluids overnight -Strict I&O's, bladder scan and in and out cath prn -Renal ultrasound, urine sodium and creatinine -  Hold nephrotoxic agents  Wound on back: Potential infectious source without purulent discharge. -Continue IV ceftriaxone -Wound care consult  Dysuria: Patient reports dysuria and suprapubic pain.  Urine has not been collected yet. -On IV ceftriaxone -Urinalysis and culture are pending, may be sterilized when collected as patient has already received antibiotics -Follow-up blood cultures  Hyperkalemia: K mildly increased to 5.6.  No EKG changes.  Anticipate this will come down with IV fluids. -Continue IV fluids -Recheck in a.m., consider Kayexalate if needed  Dermatitis: Patient with diffuse dry flaky skin.  Likely from dehydration and potentially uremia related cirrhosis. -Continue IV fluids as above -Topical lotion  Type 2 diabetes: A1c 6.7 on 12/18/2017.  She is on Lantus 20 units twice daily and Actos at home. -Reduce home Lantus 10 units twice daily, sensitive SSI  Hypertension: -Holding home HCTZ and benazepril with AKI -IV hydralazine as needed  Hyperlipidemia: -Continue pravastatin  Depression/schizophrenia: -Continue risperidone 0.5 mg p.o. twice daily and doxepin  Chronic pain: -Continue tramadol -DC meloxicam  DVT prophylaxis: subq heparin Code Status: Full code Family Communication: None present at bedside Disposition Plan: Pending improvement in renal function Consults called: None Admission status: Inpatient   Zada Finders MD [facility] Pager [phone]  If 7PM-7AM, please contact night-coverage [email or web address] Password [email or web address]  02/05/2018, 9:21 PM

## 2018-02-05 NOTE — ED Provider Notes (Signed)
[facility] EMERGENCY DEPARTMENT Provider Note   CSN: [id number] Arrival date & time: 02/05/18  1020     History   Chief Complaint Chief Complaint  Patient presents with  . Fall  . Weakness  . Failure To Thrive    HPI Heather Petty is a 67 y.o. female past medical history of AKI, arthritis, asthma, diabetes, hypertension, schizophrenia who presents for evaluation with daughter for failure to thrive, worsening ulcer to skin.  Reports that she has had ulcerations to skin before but states that they worsened over the last several weeks.  She notes particularly, the one noted on her left lower back has had more pain and more surrounding redness.  She has not noted any fevers at home.  Patient also reports that she has had some socially generalized weakness for last several weeks.  Patient states that she can barely get out of bed secondary to extreme weakness.  No focal weakness but just states she feels generalized weak.  She has had several falls related to this weakness.  Her most recent one was this morning where she fell on her knees.  Also reports decreased appetite and reports she has not been able to eat or drink.  Patient has not noted any fevers, chest pain, difficulty breathing.  The history is provided by the patient and a relative.    Past Medical History:  Diagnosis Date  . AKI (acute kidney injury) ([place]) 12/17/2017  . Arthritis   . Asthma   . Depression   . Diabetes mellitus   . Gout   . High cholesterol   . Hypertension   . Morbid obesity ([place])   . Pneumonia 12/17/2017  . Schizophrenia [facility])     Patient Active Problem List   Diagnosis Date Noted  . Acute renal failure (ARF) ([place]) 02/05/2018  . Hyperlipidemia associated with type 2 diabetes mellitus ([place]) 02/05/2018  . Hyperkalemia 02/05/2018  . Paranoid schizophrenia ([place])   . CAP (community acquired pneumonia) 12/17/2017  . AKI (acute kidney injury) ([place]) 12/17/2017  . DM2 (diabetes  mellitus, type 2) ([place]) 12/17/2017  . HTN (hypertension) 12/17/2017  . Gout     Past Surgical History:  Procedure Laterality Date  . CYST EXCISION    . DENTAL SURGERY    . TONSILLECTOMY       OB History   No obstetric history on file.      Home Medications    Prior to Admission medications   Medication Sig Start Date End Date Taking? Authorizing Provider  acetaminophen (TYLENOL) 325 MG tablet Take 650 mg by mouth every 6 (six) hours as needed for mild pain.    Yes [provider]  allopurinol (ZYLOPRIM) 300 MG tablet Take 300 mg by mouth daily.   Yes [provider]  Azelastine-Fluticasone (DYMISTA) 137-50 MCG/ACT SUSP Place 1 spray into the nose as needed (allergies).   Yes [provider]  benazepril (LOTENSIN) 20 MG tablet Take 20 mg by mouth daily.   Yes [provider]  bisacodyl (DULCOLAX) 5 MG EC tablet Take 1 tablet (5 mg total) by mouth daily as needed for moderate constipation. 12/24/17  Yes Lady Deutscher, MD  cetirizine (ZYRTEC) 10 MG tablet Take 10 mg by mouth daily as needed for allergies.   Yes [provider]  doxepin (SINEQUAN) 25 MG capsule Take 25 mg by mouth 2 (two) times daily.    Yes [provider]  EPINEPHrine (EPIPEN 2-PAK) 0.3 mg/0.3 mL IJ SOAJ  injection Inject 0.3 mg into the muscle once. For severe allergic reaction   Yes [provider]  famotidine (PEPCID) 20 MG tablet Take 20 mg by mouth 2 (two) times daily.     Yes [provider]  furosemide (LASIX) 40 MG tablet Take 80 mg by mouth daily as needed (swelling).    Yes [provider]  gabapentin (NEURONTIN) 600 MG tablet Take 0.5 tablets (300 mg total) by mouth 3 (three) times daily. 12/23/17 02/05/18 Yes Purohit, Konrad Dolores, MD  glipiZIDE (GLUCOTROL XL) 10 MG 24 hr tablet Take 10 mg by mouth 2 (two) times daily.     Yes [provider]  hydrochlorothiazide (MICROZIDE) 12.5 MG capsule Take 12.5 mg by mouth daily.  12/03/17  Yes [provider]  insulin glargine (LANTUS) 100 UNIT/ML injection Inject 0.2 mLs (20 Units total) into the skin 2 (two) times daily. 12/23/17  Yes Purohit, Konrad Dolores, MD  meloxicam (MOBIC) 7.5 MG tablet Take 7.5 mg by mouth 2 (two) times daily as needed for pain.    Yes [provider]  Multiple Vitamin (MULTIVITAMIN) tablet Take 1 tablet by mouth daily.   Yes [provider]  pioglitazone (ACTOS) 30 MG tablet Take 30 mg by mouth daily.     Yes [provider]  polyethylene glycol (MIRALAX / GLYCOLAX) packet Take 17 g by mouth 2 (two) times daily. 12/24/17  Yes Lady Deutscher, MD  potassium chloride (K-DUR) 10 MEQ tablet Take 10 mEq by mouth daily. 12/03/17  Yes [provider]  pravastatin (PRAVACHOL) 40 MG tablet Take 40 mg by mouth daily.   Yes [provider]  risperiDONE (RISPERDAL) 0.5 MG tablet Take 1 tablet (0.5 mg total) by mouth 2 (two) times daily. 12/23/17 02/05/18 Yes Purohit, Konrad Dolores, MD  traMADol (ULTRAM) 50 MG tablet Take 50 mg by mouth every 6 (six) hours as needed for moderate pain.   Yes [provider]  Vitamin D, Ergocalciferol, (DRISDOL) 1.25 MG (50000 UT) CAPS capsule Take 50,000 Units by mouth every 7 (seven) days.   Yes [provider]    Family History Family History  Family history unknown: Yes    Social History Social History   Tobacco Use  . Smoking status: Former Research scientist (life sciences)  . Smokeless tobacco: Never Used  Substance Use Topics  . Alcohol use: No  . Drug use: No     Allergies   Peanut-containing drug products   Review of Systems Review of Systems  Constitutional: Positive for activity change and fatigue. Negative for fever.  Respiratory: Negative for cough and shortness of breath.   Cardiovascular: Negative for chest pain.  Gastrointestinal: Negative for abdominal pain, nausea and vomiting.  Genitourinary: Negative for dysuria and hematuria.  Musculoskeletal:  Positive for myalgias.  Skin: Positive for color change and wound.  Neurological: Negative for headaches.  All other systems reviewed and are negative.    Physical Exam Updated Vital Signs BP (!) 152/94   Pulse (!) 104   Temp 98.9 F (37.2 C) (Rectal)   Resp (!) 22   SpO2 100%   Physical Exam Vitals signs and nursing note reviewed.  Constitutional:      Appearance: Normal appearance. She is well-developed.  HENT:     Head: Normocephalic and atraumatic.     Mouth/Throat:     Mouth: Mucous membranes are dry. No oral lesions.     Comments: Mucous membranes.  No oral lesions.  No oral angioedema. Eyes:  General: Lids are normal.     Conjunctiva/sclera: Conjunctivae normal.     Pupils: Pupils are equal, round, and reactive to light.  Neck:     Musculoskeletal: Full passive range of motion without pain.  Cardiovascular:     Rate and Rhythm: Normal rate and regular rhythm.     Pulses: Normal pulses.     Heart sounds: Normal heart sounds. No murmur. No friction rub. No gallop.   Pulmonary:     Effort: Pulmonary effort is normal.     Comments: Mild course breath sounds but no rales.  Exam limited body habitus. Abdominal:     Palpations: Abdomen is soft. Abdomen is not rigid.     Tenderness: There is no abdominal tenderness. There is no guarding.     Comments: Abdomen is soft, non-distended, non-tender. No rigidity, No guarding. No peritoneal signs.  Musculoskeletal: Normal range of motion.  Skin:    General: Skin is warm and dry.     Capillary Refill: Capillary refill takes less than 2 seconds.     Findings: Erythema present.          Comments: Diffuse erythema with overlying dry scaly skin noted to arms, legs, abdomen, chest.   Neurological:     Mental Status: She is alert.     Comments: Patient will respond to verbal stimuli and answer questions appropriately.  Psychiatric:        Speech: Speech normal.           ED Treatments / Results  Labs (all labs  ordered are listed, but only abnormal results are displayed) Labs Reviewed  COMPREHENSIVE METABOLIC PANEL - Abnormal; Notable for the following components:      Result Value   Potassium 5.6 (*)    CO2 16 (*)    Glucose, Bld 167 (*)    BUN 80 (*)    Creatinine, Ser 3.74 (*)    Total Protein 5.4 (*)    Albumin 2.6 (*)    AST 43 (*)    ALT 52 (*)    Alkaline Phosphatase 276 (*)    GFR calc non Af Amer 12 (*)    GFR calc Af Amer 14 (*)    Anion gap 16 (*)    All other components within normal limits  CBC WITH DIFFERENTIAL/PLATELET - Abnormal; Notable for the following components:   RBC 2.95 (*)    Hemoglobin 9.2 (*)    HCT 29.3 (*)    RDW 16.4 (*)    Eosinophils Absolute 1.2 (*)    All other components within normal limits  I-STAT CG4 LACTIC ACID, ED - Abnormal; Notable for the following components:   Lactic Acid, Venous 2.60 (*)    All other components within normal limits  CULTURE, BLOOD (ROUTINE X 2)  CULTURE, BLOOD (ROUTINE X 2)  URINALYSIS, ROUTINE W REFLEX MICROSCOPIC  I-STAT TROPONIN, ED  I-STAT CG4 LACTIC ACID, ED    EKG None  Radiology Dg Chest 2 View  Result Date: 02/05/2018 CLINICAL DATA:  Cough, fever EXAM: CHEST - 2 VIEW COMPARISON:  12/17/2017 FINDINGS: Cardiomegaly. No confluent airspace opacities, effusions or edema. No acute bony abnormality. IMPRESSION: Cardiomegaly.  No active disease. Electronically Signed   By: Rolm Baptise M.D.   On: 02/05/2018 12:00    Procedures Procedures (including critical care time)  Medications Ordered in ED Medications  cefTRIAXone (ROCEPHIN) 2 g in sodium chloride 0.9 % 100 mL IVPB (0 g Intravenous Stopped 02/05/18 1507)  sodium chloride 0.9 %  bolus 1,000 mL (0 mLs Intravenous Stopped 02/05/18 1416)  sodium chloride 0.9 % bolus 1,000 mL (0 mLs Intravenous Stopped 02/05/18 1951)    And  sodium chloride 0.9 % bolus 1,000 mL (0 mLs Intravenous Stopped 02/05/18 1951)  sodium chloride 0.9 % bolus 1,000 mL (0 mLs Intravenous  Stopped 02/05/18 1951)     Initial Impression / Assessment and Plan / ED Course  I have reviewed the triage vital signs and the nursing notes.  Pertinent labs & imaging results that were available during my care of the patient were reviewed by me and considered in my medical decision making (see chart for details).     67 year old female brought in by daughter for concerns of generalized weakness, failure to thrive, worsening wound to back.  Previous provider had initially talked with daughter during MSE.  On my evaluation, daughter was not present.  Per previous provider, patient has had multiple falls.  Even when she tries to get out of bed, she is so generalized weak that she falls.  Additionally, she has not had any p.o.  No fevers noted at home.  On initial ED arrival, patient is afebrile, vital signs stable and reviewed.  Patient is a very poor historian.  She reports pain and generalized weakness all over.  She does appear very dehydrated.  We will plan to check labs, chest x-ray.  Lactic elevated at 2.60.  I-STAT troponin negative.  CBC shows no leukocytosis.  Hemoglobin is 9.2, hematocrit 29.3.  This is consistent with patient's baseline.  CMP shows hyperkalemia of 5.6.  BUN and creatinine are elevated at 80 and 3.74.  Her most recent one in November 2018 showed BUN of 19 and a creatinine of 0.95.  Alk phos is elevated at 276.  AST and ALT are elevated slightly.  She has an anion gap of 16.  Review of CMP done in November 2018 does show previous elevation in alk phos.  Chest x-ray negative for any acute infectious etiology.  On my evaluation, I do not see any documented history of CHF.  No recent echo.  We will plan to give patient fluid bolus.  Her through full 30cc/kg cause for 4500 mL.  Will start off with 3000 mL.  Given concerns for new AKI, possible cellulitis, recommend admission.  Discussed patient with Dr. [place] Crane (hospitalist). Will plan for admission.   Discussed patient with Dr.  [place] Crane (hospitalist) after evaluation of patient in the ED.  At this time, she is still hypotensive despite 3 L of fluid.  Additionally, she is concerned about the diffuse erythema to patient's body.  She is concerned if this is more as she has versus infectious process.  She recommends contacting ID for their recommendation.  Discussed patient with Dr. Johnnye Sima (ID).  He recommends that if there is concern for dermatological involvement, that Derm be consulted rather than ID.  Evaluated patient with ED attending.  Patient with no Nikolsky sign, no oral lesions.  Do not suspect ACS at this time.   Suspect that this is more likely due to uremic/dry skin versus dehydration.  Discussed with Dr. [place] Crane.  Given concerns for continued hypotension, she recommends that we contact critical care for evaluation and possible admission of patient.  Discussed patient with Critical Care. They will come evaluate patient in the ED.   Discussed patient with Dr. Loanne Drilling [facility]) after evaluation of patient in the ED. She does not feel that patient is appropriate for critical care. She feels that patient  can be admitted to medicine. No indication for vasopressors at this time.    Final Clinical Impressions(s) / ED Diagnoses   Final diagnoses:  Acute kidney injury ([place])  Skin ulcer, unspecified ulcer stage [facility])  Adult failure to thrive    ED Discharge Orders    None       Desma Mcgregor 02/05/18 2001    Tegeler, Gwenyth Allegra, MD 02/08/18 [phone]

## 2018-02-05 NOTE — ED Provider Notes (Signed)
MSE was initiated and I personally evaluated the patient and placed orders (if any) at  1:19 PM on February 05, 2018.  The patient appears stable so that the remainder of the MSE may be completed by another provider.  Patient initially evaluated in lower acuity area of department emergency department, per was transferred to a higher acuity area.  Patient with past medical history as below.  Patient presents for generalized pain from ulcerations on her skin, as well as his weakness.  Patient reports that she has become so weak, when she tries to get out of bed, she will fall.  She reports that she had a fall to her knees today.  She denies any head or neck injury this incident nor any pain in her extremities.  She reports that she has severe ulcerations particularly of her left lower back.  She reports that her skin has diffuse erythema.  Patient reports that she had pneumonia in November 2019, however finished her antibiotics from this.  Denies chills or fever at home.  Past Medical History:  Diagnosis Date  . AKI (acute kidney injury) ([place]) 12/17/2017  . Arthritis   . Asthma   . Depression   . Diabetes mellitus   . Gout   . High cholesterol   . Hypertension   . Morbid obesity ([place])   . Pneumonia 12/17/2017  . Schizophrenia ([place])     Physical Exam  BP (!) 100/54   Pulse 75   Temp 98.9 F (37.2 C) (Rectal)   Resp 19   SpO2 (!) 88%   Physical Exam Vitals signs and nursing note reviewed.  Constitutional:      General: She is not in acute distress.    Appearance: She is well-developed. She is not diaphoretic.     Comments: Sitting comfortably in bed.  HENT:     Head: Normocephalic and atraumatic.  Eyes:     General:        Right eye: No discharge.        Left eye: No discharge.     Conjunctiva/sclera: Conjunctivae normal.     Comments: EOMs normal to gross examination.  Neck:     Musculoskeletal: Normal range of motion.  Cardiovascular:     Rate and Rhythm: Normal rate and  regular rhythm.     Comments: Intact, 2+ radial pulse. No tachycardia. Pulmonary:     Effort: Pulmonary effort is normal.     Comments: Diminished lung sounds.  No rales. Abdominal:     General: There is no distension.  Musculoskeletal: Normal range of motion.  Skin:    General: Skin is warm and dry.     Findings: Erythema present.     Comments: Patient has diffuse xerosis and edema of the skin.  Erythema greatest in bilateral flanks.  Patient has shallow ulceration of the left lower back.  See below for image.  Neurological:     Mental Status: She is alert.     Comments: Cranial nerves intact to gross observation. Patient moves extremities without difficulty.  Psychiatric:        Behavior: Behavior normal.        Thought Content: Thought content normal.        Judgment: Judgment normal.       ED Course/Procedures     Procedures  MDM   On initial examination, patient is nontoxic-appearing, afebrile, appears to be of poor volume status.  Clinically, do have concerns about infection from the ulceration patient's back.  Sepsis labs ordered, pending. Suspect cellulitis of the area around skin ulceration of the back.   Care signed out to [facility], PA-C to await results.        Albesa Seen, PA-C 02/05/18 1609    Orlie Dakin, MD 02/05/18 1721

## 2018-02-05 NOTE — ED Triage Notes (Signed)
EMS stated, she has had multiple falls  Due to weakness. She has a lot of skin ulcers, dry skin due to being noncompliant.

## 2018-02-06 DIAGNOSIS — Z794 Long term (current) use of insulin: Secondary | ICD-10-CM

## 2018-02-06 DIAGNOSIS — N179 Acute kidney failure, unspecified: Secondary | ICD-10-CM | POA: Diagnosis not present

## 2018-02-06 DIAGNOSIS — L89899 Pressure ulcer of other site, unspecified stage: Secondary | ICD-10-CM

## 2018-02-06 DIAGNOSIS — L8990 Pressure ulcer of unspecified site, unspecified stage: Secondary | ICD-10-CM

## 2018-02-06 DIAGNOSIS — L309 Dermatitis, unspecified: Secondary | ICD-10-CM | POA: Diagnosis not present

## 2018-02-06 DIAGNOSIS — E119 Type 2 diabetes mellitus without complications: Secondary | ICD-10-CM | POA: Diagnosis not present

## 2018-02-06 LAB — CBC
HCT: 26.1 % — ABNORMAL LOW (ref 36.0–46.0)
Hemoglobin: 8 g/dL — ABNORMAL LOW (ref 12.0–15.0)
MCH: 31.3 pg (ref 26.0–34.0)
MCHC: 30.7 g/dL (ref 30.0–36.0)
MCV: 102 fL — ABNORMAL HIGH (ref 80.0–100.0)
NRBC: 0 % (ref 0.0–0.2)
Platelets: 196 10*3/uL (ref 150–400)
RBC: 2.56 MIL/uL — ABNORMAL LOW (ref 3.87–5.11)
RDW: 17.1 % — ABNORMAL HIGH (ref 11.5–15.5)
WBC: 5.4 10*3/uL (ref 4.0–10.5)

## 2018-02-06 LAB — URINALYSIS, ROUTINE W REFLEX MICROSCOPIC
Bilirubin Urine: NEGATIVE
Glucose, UA: NEGATIVE mg/dL
Hgb urine dipstick: NEGATIVE
KETONES UR: NEGATIVE mg/dL
Leukocytes, UA: NEGATIVE
Nitrite: NEGATIVE
PROTEIN: NEGATIVE mg/dL
Specific Gravity, Urine: 1.013 (ref 1.005–1.030)
pH: 5 (ref 5.0–8.0)

## 2018-02-06 LAB — RENAL FUNCTION PANEL
Albumin: 2.1 g/dL — ABNORMAL LOW (ref 3.5–5.0)
Anion gap: 10 (ref 5–15)
BUN: 66 mg/dL — ABNORMAL HIGH (ref 8–23)
CO2: 17 mmol/L — ABNORMAL LOW (ref 22–32)
Calcium: 8.2 mg/dL — ABNORMAL LOW (ref 8.9–10.3)
Chloride: 116 mmol/L — ABNORMAL HIGH (ref 98–111)
Creatinine, Ser: 2.65 mg/dL — ABNORMAL HIGH (ref 0.44–1.00)
GFR calc Af Amer: 20 mL/min — ABNORMAL LOW (ref >60)
GFR calc non Af Amer: 18 mL/min — ABNORMAL LOW (ref >60)
Glucose, Bld: 176 mg/dL — ABNORMAL HIGH (ref 70–99)
Phosphorus: 4.8 mg/dL — ABNORMAL HIGH (ref 2.5–4.6)
Potassium: 4.9 mmol/L (ref 3.5–5.1)
Sodium: 143 mmol/L (ref 135–145)

## 2018-02-06 LAB — GLUCOSE, CAPILLARY
Glucose-Capillary: 146 mg/dL — ABNORMAL HIGH (ref 70–99)
Glucose-Capillary: 109 mg/dL — ABNORMAL HIGH (ref 70–99)
Glucose-Capillary: 125 mg/dL — ABNORMAL HIGH (ref 70–99)
Glucose-Capillary: 124 mg/dL — ABNORMAL HIGH (ref 70–99)

## 2018-02-06 LAB — CREATININE, URINE, RANDOM: Creatinine, Urine: 124.94 mg/dL

## 2018-02-06 LAB — SODIUM, URINE, RANDOM: Sodium, Ur: 49 mmol/L

## 2018-02-06 MED ORDER — CYANOCOBALAMIN 1000 MCG/ML IJ SOLN
1000.0000 ug | Freq: Every day | INTRAMUSCULAR | Status: DC
  Administered 2018-02-06 – 2018-02-08 (×3): 1000 ug via INTRAMUSCULAR
  Filled 2018-02-06 (×4): qty 1

## 2018-02-06 MED ORDER — SODIUM CHLORIDE 0.9 % IV SOLN
INTRAVENOUS | Status: DC
  Administered 2018-02-06 – 2018-02-08 (×3): via INTRAVENOUS

## 2018-02-06 NOTE — Progress Notes (Signed)
PROGRESS NOTE                                                                                                                                                                                                             Patient Demographics:    Heather Petty, is a 68 y.o. female, DOB - [birth year], BRA:[id number]  Admit date - 02/05/2018   Admitting Physician Lenore Cordia, MD  Outpatient Primary MD for the patient is [name], MD  LOS - 1   Chief Complaint  Patient presents with  . Fall  . Weakness  . Failure To Thrive       Brief Narrative     68 y.o. female with medical history significant for T2DM, HTN, HLD, Asthma, Anemia, Depression/Schizophrenia who presents the ED from home with reported progressive failure to thrive with poor oral intake ongoing over the last 3 weeks.  She was admitted from 12/17/2017-12/24/2017 for community-acquired pneumonia and AKI. She reports generalized fatigue and dry flaky skin all over her body.   Work-up was significant for acute AKI with a creatinine of 3.5, admitted for further management     Subjective:    Heather Petty today has, No headache, No chest pain, No abdominal pain - No Nausea, she is feeling weak, fatigued,  Assessment  & Plan :    Principal Problem:   Acute renal failure (ARF) (HCC) Active Problems:   DM2 (diabetes mellitus, type 2) (HCC)   HTN (hypertension)   Paranoid schizophrenia ([place])   Hyperlipidemia associated with type 2 diabetes mellitus ([place])   Hyperkalemia   Dermatitis   Open back wound   Dysuria   Pressure injury of skin   Acute renal failure: -Creatinine elevated 3.7 on admission, plan around 1, this is most likely in the setting of poor oral intake, and taking benazepril, Lasix, hydrochlorothiazide and meloxicam , continue to hold these medications, continue with IV fluids, follow on renal ultrasound . -Creatinine improving with IV fluids, it is 2.6 today     Wound on back: -Continue with IV Rocephin, wound care consult appreciated  Dysuria: - Reports dysuria and suprapubic pain on admission, her UA is negative, continue to monitor   Hyperkalemia: -Potassium 5.6 on admission, in the setting of AKI, improved to 4.9 today, continue with IV fluids  Dermatitis: Patient with diffuse dry flaky  skin.  Likely from dehydration and potentially uremia related cirrhosis. -Continue IV fluids as above -Topical lotion  Type 2 diabetes: A1c 6.7 on 12/18/2017.  She is on Lantus 20 units twice daily and Actos at home. -Reduce home Lantus 10 units twice daily, sensitive SSI  Hypertension: -Pressure remains on the lower side, continue to hold hydrochlorothiazide and benazepril  Hyperlipidemia: -Continue pravastatin  Depression/schizophrenia: -Continue risperidone 0.5 mg p.o. twice daily and doxepin  Chronic pain: -Continue tramadol -DC meloxicam  Anemia of chronic disease -baseline  hemoglobin around 7.9, it was 9 due to dehydration on admission, it did drop back to 8 today, most likely dilutional. -Anemia panel significant for normal iron, elevated ferritin, and B12 on the lower side at 276, continue with B12 supplements.    Code Status : Full  Family Communication  : None at bedside  Disposition Plan  : Home once stable  Barriers For Discharge : Creatinine still elevated at 2.6, continue with IV fluids  Consults  : None  Procedures  : None  DVT Prophylaxis  :  [place] heparin  Lab Results  Component Value Date   PLT 196 02/06/2018    Antibiotics  :   Anti-infectives (From admission, onward)   Start     Dose/Rate Route Frequency Ordered Stop   02/05/18 1330  cefTRIAXone (ROCEPHIN) 2 g in sodium chloride 0.9 % 100 mL IVPB     2 g 200 mL/hr over 30 Minutes Intravenous Every 24 hours 02/05/18 1320          Objective:   Vitals:   02/05/18 1900 02/05/18 1948 02/05/18 2132 02/06/18 0541  BP: (!) 102/55 (!) 152/94  125/62 (!) 101/39  Pulse: 97 (!) 104 99 64  Resp: 16 (!) 22 (!) 22 20  Temp:   97.7 F (36.5 C) 98.1 F (36.7 C)  TempSrc:   Oral Oral  SpO2: 99% 100% 99% 98%    Wt Readings from Last 3 Encounters:  12/17/17 (!) 136.1 kg  04/23/11 136.1 kg     Intake/Output Summary (Last 24 hours) at 02/06/2018 1151 Last data filed at 02/06/2018 0512 Gross per 24 hour  Intake 1880.66 ml  Output 450 ml  Net 1430.66 ml     Physical Exam  Awake Alert, sleeping comfortably Symmetrical Chest wall movement, Good air movement bilaterally, CTAB RRR,No Gallops,Rubs or new Murmurs, No Parasternal Heave +ve B.Sounds, Abd Soft, No tenderness,  No rebound - guarding or rigidity. No Cyanosis, Clubbing or edema, Skin diffusely scaly/flaky skin    Data Review:    CBC Recent Labs  Lab 02/05/18 1121 02/06/18 0735  WBC 8.9 5.4  HGB 9.2* 8.0*  HCT 29.3* 26.1*  PLT 229 196  MCV 99.3 102.0*  MCH 31.2 31.3  MCHC 31.4 30.7  RDW 16.4* 17.1*  LYMPHSABS 0.7  --   MONOABS 0.3  --   EOSABS 1.2*  --   BASOSABS 0.0  --     Chemistries  Recent Labs  Lab 02/05/18 1121 02/06/18 0606  NA 139 143  K 5.6* 4.9  CL 107 116*  CO2 16* 17*  GLUCOSE 167* 176*  BUN 80* 66*  CREATININE 3.74* 2.65*  CALCIUM 8.9 8.2*  AST 43*  --   ALT 52*  --   ALKPHOS 276*  --   BILITOT 0.9  --    ------------------------------------------------------------------------------------------------------------------ No results for input(s): CHOL, HDL, LDLCALC, TRIG, CHOLHDL, LDLDIRECT in the last 72 hours.  Lab Results  Component Value Date   HGBA1C  6.7 (H) 12/18/2017   ------------------------------------------------------------------------------------------------------------------ No results for input(s): TSH, T4TOTAL, T3FREE, THYROIDAB in the last 72 hours.  Invalid input(s): FREET3 ------------------------------------------------------------------------------------------------------------------ No results for  input(s): VITAMINB12, FOLATE, FERRITIN, TIBC, IRON, RETICCTPCT in the last 72 hours.  Coagulation profile No results for input(s): INR, PROTIME in the last 168 hours.  No results for input(s): DDIMER in the last 72 hours.  Cardiac Enzymes No results for input(s): CKMB, TROPONINI, MYOGLOBIN in the last 168 hours.  Invalid input(s): CK ------------------------------------------------------------------------------------------------------------------ No results found for: BNP  Inpatient Medications  Scheduled Meds: . cetaphil  1 application Topical BID  . doxepin  25 mg Oral BID  . famotidine  20 mg Oral QHS  . gabapentin  300 mg Oral TID  . heparin  5,000 Units Subcutaneous Q8H  . insulin aspart  0-9 Units Subcutaneous TID WC  . insulin glargine  10 Units Subcutaneous BID  . pravastatin  40 mg Oral Q2200  . risperiDONE  0.5 mg Oral BID  . sodium chloride flush  3 mL Intravenous Q12H   Continuous Infusions: . cefTRIAXone (ROCEPHIN)  IV Stopped (02/05/18 1507)   PRN Meds:.acetaminophen **OR** acetaminophen, bisacodyl, hydrALAZINE, traMADol  Micro Results No results found for this or any previous visit (from the past 240 hour(s)).  Radiology Reports Dg Chest 2 View  Result Date: 02/05/2018 CLINICAL DATA:  Cough, fever EXAM: CHEST - 2 VIEW COMPARISON:  12/17/2017 FINDINGS: Cardiomegaly. No confluent airspace opacities, effusions or edema. No acute bony abnormality. IMPRESSION: Cardiomegaly.  No active disease. Electronically Signed   By: Rolm Baptise M.D.   On: 02/05/2018 12:00   US Renal  Result Date: 02/06/2018 CLINICAL DATA:  Acute renal failure EXAM: RENAL / URINARY TRACT ULTRASOUND COMPLETE COMPARISON:  12/17/2017 CT abdomen/pelvis. FINDINGS: Examination limited by patient body habitus. Right Kidney: Renal measurements: 9.6 x 4.4 x 5.6 cm = volume: 123 mL . Echogenicity within normal limits. No mass or hydronephrosis visualized. Incidentally noted diffuse hepatic steatosis in  the visualized right liver lobe. Left Kidney: Renal measurements: 9.5 x 4.5 x 4.6 cm = volume: 102 mL. Echogenicity within normal limits. No mass or hydronephrosis visualized. Bladder: Appears normal for degree of bladder distention. IMPRESSION: Normal ultrasound of the kidneys and bladder. No hydronephrosis. Incidental diffuse hepatic steatosis. Electronically Signed   By: Ilona Sorrel M.D.   On: 02/06/2018 00:24    Phillips Climes M.D on 02/06/2018 at 11:51 AM  Between 7am to 7pm - Pager - [phone]  After 7pm go to [email or web address] - password [facility]  [facility] -  Office  [phone]

## 2018-02-06 NOTE — Progress Notes (Signed)
Patient received from ED via bed. Patient is alert and oriented x3. Vital signs are stable. Skin assessment done with another nurse. Iv in place and running fluid. Patient given instructions about call bell and phone. Bed in low position and call bell in reach.

## 2018-02-07 DIAGNOSIS — S21209D Unspecified open wound of unspecified back wall of thorax without penetration into thoracic cavity, subsequent encounter: Secondary | ICD-10-CM | POA: Diagnosis not present

## 2018-02-07 DIAGNOSIS — E785 Hyperlipidemia, unspecified: Secondary | ICD-10-CM

## 2018-02-07 DIAGNOSIS — Z8701 Personal history of pneumonia (recurrent): Secondary | ICD-10-CM

## 2018-02-07 DIAGNOSIS — R197 Diarrhea, unspecified: Secondary | ICD-10-CM | POA: Diagnosis not present

## 2018-02-07 DIAGNOSIS — R011 Cardiac murmur, unspecified: Secondary | ICD-10-CM

## 2018-02-07 DIAGNOSIS — F209 Schizophrenia, unspecified: Secondary | ICD-10-CM

## 2018-02-07 DIAGNOSIS — F329 Major depressive disorder, single episode, unspecified: Secondary | ICD-10-CM

## 2018-02-07 DIAGNOSIS — L98499 Non-pressure chronic ulcer of skin of other sites with unspecified severity: Secondary | ICD-10-CM

## 2018-02-07 DIAGNOSIS — L309 Dermatitis, unspecified: Secondary | ICD-10-CM | POA: Diagnosis not present

## 2018-02-07 DIAGNOSIS — D649 Anemia, unspecified: Secondary | ICD-10-CM

## 2018-02-07 DIAGNOSIS — N179 Acute kidney failure, unspecified: Secondary | ICD-10-CM | POA: Diagnosis not present

## 2018-02-07 DIAGNOSIS — Z91010 Allergy to peanuts: Secondary | ICD-10-CM

## 2018-02-07 DIAGNOSIS — E119 Type 2 diabetes mellitus without complications: Secondary | ICD-10-CM

## 2018-02-07 DIAGNOSIS — I10 Essential (primary) hypertension: Secondary | ICD-10-CM

## 2018-02-07 DIAGNOSIS — Z87891 Personal history of nicotine dependence: Secondary | ICD-10-CM

## 2018-02-07 DIAGNOSIS — K144 Atrophy of tongue papillae: Secondary | ICD-10-CM

## 2018-02-07 DIAGNOSIS — J45909 Unspecified asthma, uncomplicated: Secondary | ICD-10-CM

## 2018-02-07 LAB — CBC
HCT: 27.7 % — ABNORMAL LOW (ref 36.0–46.0)
Hemoglobin: 8.1 g/dL — ABNORMAL LOW (ref 12.0–15.0)
MCH: 29.8 pg (ref 26.0–34.0)
MCHC: 29.2 g/dL — ABNORMAL LOW (ref 30.0–36.0)
MCV: 101.8 fL — ABNORMAL HIGH (ref 80.0–100.0)
Platelets: 211 10*3/uL (ref 150–400)
RBC: 2.72 MIL/uL — ABNORMAL LOW (ref 3.87–5.11)
RDW: 17.2 % — ABNORMAL HIGH (ref 11.5–15.5)
WBC: 4.8 10*3/uL (ref 4.0–10.5)
nRBC: 0 % (ref 0.0–0.2)

## 2018-02-07 LAB — UREA NITROGEN, URINE: Urea Nitrogen, Ur: 490 mg/dL

## 2018-02-07 LAB — BASIC METABOLIC PANEL
ANION GAP: 8 (ref 5–15)
BUN: 54 mg/dL — ABNORMAL HIGH (ref 8–23)
CO2: 16 mmol/L — ABNORMAL LOW (ref 22–32)
Calcium: 8.4 mg/dL — ABNORMAL LOW (ref 8.9–10.3)
Chloride: 117 mmol/L — ABNORMAL HIGH (ref 98–111)
Creatinine, Ser: 2.14 mg/dL — ABNORMAL HIGH (ref 0.44–1.00)
GFR calc Af Amer: 27 mL/min — ABNORMAL LOW (ref >60)
GFR calc non Af Amer: 23 mL/min — ABNORMAL LOW (ref >60)
Glucose, Bld: 94 mg/dL (ref 70–99)
Potassium: 4.6 mmol/L (ref 3.5–5.1)
Sodium: 141 mmol/L (ref 135–145)

## 2018-02-07 LAB — GLUCOSE, CAPILLARY
GLUCOSE-CAPILLARY: 192 mg/dL — AB (ref 70–99)
Glucose-Capillary: 74 mg/dL (ref 70–99)
Glucose-Capillary: 116 mg/dL — ABNORMAL HIGH (ref 70–99)
Glucose-Capillary: 185 mg/dL — ABNORMAL HIGH (ref 70–99)

## 2018-02-07 MED ORDER — VANCOMYCIN HCL 10 G IV SOLR
2000.0000 mg | Freq: Once | INTRAVENOUS | Status: AC
  Administered 2018-02-07: 2000 mg via INTRAVENOUS
  Filled 2018-02-07: qty 2000

## 2018-02-07 MED ORDER — FLUCONAZOLE IN SODIUM CHLORIDE 400-0.9 MG/200ML-% IV SOLN
400.0000 mg | Freq: Once | INTRAVENOUS | Status: AC
  Administered 2018-02-07: 400 mg via INTRAVENOUS
  Filled 2018-02-07 (×2): qty 200

## 2018-02-07 MED ORDER — SILVER SULFADIAZINE 1 % EX CREA
TOPICAL_CREAM | Freq: Two times a day (BID) | CUTANEOUS | Status: DC
  Administered 2018-02-07 – 2018-02-08 (×3): via TOPICAL
  Filled 2018-02-07: qty 85

## 2018-02-07 MED ORDER — LIDOCAINE HCL 2 % IJ SOLN
15.0000 mL | Freq: Once | INTRAMUSCULAR | Status: DC
  Filled 2018-02-07 (×3): qty 20

## 2018-02-07 MED ORDER — COLLAGENASE 250 UNIT/GM EX OINT
TOPICAL_OINTMENT | Freq: Every day | CUTANEOUS | Status: DC
  Administered 2018-02-07: 10:00:00 via TOPICAL
  Filled 2018-02-07: qty 30

## 2018-02-07 MED ORDER — FLUCONAZOLE IN SODIUM CHLORIDE 200-0.9 MG/100ML-% IV SOLN: 200.0000 mg | INTRAVENOUS | Status: DC

## 2018-02-07 MED ORDER — FLUCONAZOLE IN SODIUM CHLORIDE 400-0.9 MG/200ML-% IV SOLN: 400.0000 mg | INTRAVENOUS | Status: DC

## 2018-02-07 MED ORDER — VANCOMYCIN HCL IN DEXTROSE 1-5 GM/200ML-% IV SOLN: 1000.0000 mg | INTRAVENOUS | Status: DC

## 2018-02-07 NOTE — Evaluation (Signed)
Physical Therapy Evaluation Patient Details Name: Heather Petty MRN: [id number] DOB: [birth year] Today's Date: 02/07/2018   History of Present Illness  Heather Petty is a 68 y.o. female with medical history significant for T2DM, HTN, HLD, Asthma, Anemia, Depression/Schizophrenia who presents the ED from home with reported progressive failure to thrive with poor oral intake ongoing over the last 3 weeks.pt was recently admitted for CAP and AKI,  Pt reports months of dry, itchy skin and general fatigue.  Clinical Impression  Pt admitted with/for FTT.  Pt is needing significant assist to move in bed and to the chair.  Pt has significant skin breakdown and wounds..  Pt currently limited functionally due to the problems listed. ( See problems list.)   Pt will benefit from PT to maximize function and safety in order to get ready for next venue listed below.     Follow Up Recommendations SNF;Supervision/Assistance - 24 hour    Equipment Recommendations  Other (comment)(TBA)    Recommendations for Other Services       Precautions / Restrictions Precautions Precautions: Fall      Mobility  Bed Mobility Overal bed mobility: Needs Assistance Bed Mobility: Supine to Sit     Supine to sit: Max assist     General bed mobility comments: needed assist moving legs to EOB, assisting pivot with draw pad and truncal assist to come up and forward.  Transfers Overall transfer level: Needs assistance   Transfers: Sit to/from Stand;Stand Pivot Transfers Sit to Stand: Max assist Stand pivot transfers: Max assist       General transfer comment: cues for hand placement and setting up for transfer.  Assist for forward and up and stability with taking small pivotal shuffles.  Ambulation/Gait             General Gait Details: Not able  Stairs            Wheelchair Mobility    Modified Rankin (Stroke Patients Only)       Balance Overall balance assessment: Needs  assistance Sitting-balance support: No upper extremity supported Sitting balance-Leahy Scale: Fair Sitting balance - Comments: pt not challenged     Standing balance-Leahy Scale: Poor Standing balance comment: reliant on face to face assist                             Pertinent Vitals/Pain Pain Assessment: Faces Faces Pain Scale: Hurts whole lot Pain Location: wounds in skin folds front/back and over bony prominences Pain Descriptors / Indicators: Burning;Discomfort;Grimacing;Tender Pain Intervention(s): Monitored during session;Repositioned    Home Living Family/patient expects to be discharged to:: Private residence Living Arrangements: Alone Available Help at Discharge: Personal care attendant;Family;Available PRN/intermittently;Other (Comment)(PCA 2-5 pm) Type of Home: House Home Access: Ramped entrance     Home Layout: One level Home Equipment: Walker - standard;Cane - single point;Shower seat - built in;Wheelchair Hotel manager - power      Prior Function Level of Independence: Needs assistance   Gait / Transfers Assistance Needed: uses cane to transfer to electric w/c,   ADL's / Homemaking Assistance Needed: aide 3hrs/day, 5 days/wk for bathing, grooming light house work, daughter generally helps in morning and evening        Hand Dominance   Dominant Hand: Right    Extremity/Trunk Assessment   Upper Extremity Assessment Upper Extremity Assessment: Generalized weakness    Lower Extremity Assessment Lower Extremity Assessment: Generalized weakness;RLE deficits/detail;LLE deficits/detail RLE Deficits / Details: grossly  3- to 3/5 RLE: Unable to fully assess due to pain RLE Coordination: decreased fine motor;decreased gross motor LLE Deficits / Details: grossly 3- to 3/5,  LLE: Unable to fully assess due to pain LLE Coordination: decreased fine motor;decreased gross motor       Communication   Communication: Other (comment)(Can speak  English well)  Cognition Arousal/Alertness: Awake/alert Behavior During Therapy: Restless;Anxious;WFL for tasks assessed/performed Overall Cognitive Status: No family/caregiver present to determine baseline cognitive functioning                                 General Comments: NT formally, but pt not a good historian either      General Comments      Exercises     Assessment/Plan    PT Assessment Patient needs continued PT services  PT Problem List Decreased strength;Decreased activity tolerance;Decreased balance;Decreased mobility;Decreased knowledge of use of DME;Decreased skin integrity;Obesity;Pain       PT Treatment Interventions DME instruction;Functional mobility training;Therapeutic activities;Therapeutic exercise;Balance training;Patient/family education;Gait training    PT Goals (Current goals can be found in the Care Plan section)  Acute Rehab PT Goals PT Goal Formulation: Patient unable to participate in goal setting Time For Goal Achievement: 02/21/18 Potential to Achieve Goals: Fair    Frequency Min 3X/week   Barriers to discharge Decreased caregiver support      Co-evaluation               AM-PAC PT "6 Clicks" Mobility  Outcome Measure Help needed turning from your back to your side while in a flat bed without using bedrails?: A Lot Help needed moving from lying on your back to sitting on the side of a flat bed without using bedrails?: A Lot Help needed moving to and from a bed to a chair (including a wheelchair)?: A Lot Help needed standing up from a chair using your arms (e.g., wheelchair or bedside chair)?: Total Help needed to walk in hospital room?: Total Help needed climbing 3-5 steps with a railing? : Total 6 Click Score: 9    End of Session   Activity Tolerance: Patient tolerated treatment well Patient left: in chair;with chair alarm set Nurse Communication: Mobility status PT Visit Diagnosis: Unsteadiness on feet  (R26.81);Muscle weakness (generalized) (M62.81);Other abnormalities of gait and mobility (R26.89);Adult, failure to thrive (R62.7);Pain Pain - part of body: (wounds in fold and over prominences)    Time: 2353-6144 PT Time Calculation (min) (ACUTE ONLY): 47 min   Charges:   PT Evaluation $PT Eval High Complexity: 1 High PT Treatments $Therapeutic Activity: 23-37 mins        02/07/2018  Donnella Sham, PT Acute Rehabilitation Services [phone]  (pager) [phone]  (office)  Tessie Fass Adet[place]unbo Mccadden 02/07/2018, 1:32 PM

## 2018-02-07 NOTE — Progress Notes (Signed)
PROGRESS NOTE                                                                                                                                                                                                             Patient Demographics:    Christen Wardrop, is a 68 y.o. female, DOB - Nov 12, 1950, DXA:[id number]  Admit date - 02/05/2018   Admitting Physician Lenore Cordia, MD  Outpatient Primary MD for the patient is [name], MD  LOS - 2   Chief Complaint  Patient presents with  . Fall  . Weakness  . Failure To Thrive       Brief Narrative     68 y.o. female with medical history significant for T2DM, HTN, HLD, Asthma, Anemia, Depression/Schizophrenia who presents the ED from home with reported progressive failure to thrive with poor oral intake ongoing over the last 3 weeks.  She was admitted from 12/17/2017-12/24/2017 for community-acquired pneumonia and AKI.  She was discharged on Augmentin, she reports generalized fatigue and dry flaky skin all over her body.   Work-up was significant for acute AKI with a creatinine of 3.5, admitted for further management     Subjective:    [name] today has, No headache, No chest pain, No abdominal pain - No Nausea, she is feeling weak, fatigued,  Assessment  & Plan :    Principal Problem:   Acute renal failure (ARF) (HCC) Active Problems:   DM2 (diabetes mellitus, type 2) (HCC)   HTN (hypertension)   Paranoid schizophrenia ([place])   Hyperlipidemia associated with type 2 diabetes mellitus ([place])   Hyperkalemia   Dermatitis   Open back wound   Dysuria   Pressure injury of skin   Acute renal failure: -Creatinine elevated 3.7 on admission, baseline creatinine around 1 , will failure most likely in the setting of poor oral intake, fluid loss from her wounds, while she was taking hydrochlorothiazide, Lasix and benazepril and meloxicam , she need to hold nephrotoxic medication, continue  with IV fluids, creatinine improving, it is 2.1 today .  Diffuse dermatitis, with multiple skin wounds in the groin buttock area lower back area -Discussed with the daughter, ulcer and which started around a month ago, given penicillin course for hemodialysis by her PCP, no improvement -Medically on IV Rocephin, appears to be with foul-smelling order today,  I have added Diflucan and vancomycin, requested ID input -I have placed a call to [facility] requested transfer to see if dermatology evaluation is indicated.  Dysuria: - Reports dysuria and suprapubic pain on admission, her UA is negative, continue to monitor  Hyperkalemia: -Potassium 5.6 on admission, in the setting of AKI, improved to 4.9 today, continue with IV fluids  Type 2 diabetes: A1c 6.7 on 12/18/2017.  She is on Lantus 20 units twice daily and Actos at home. -Reduce home Lantus 10 units twice daily, sensitive SSI BG is controlled on this regimen  Hypertension: -Patient remains soft, continue to hold home meds including hydrochlorothiazide and benazepril   Hyperlipidemia: -Continue pravastatin  Depression/schizophrenia: -Continue risperidone 0.5 mg p.o. twice daily and doxepin  Chronic pain: -Continue tramadol -DC meloxicam  Anemia of chronic disease -baseline  hemoglobin around 7.9, it was 9 due to dehydration on admission, it did drop back to 8 today, most likely dilutional. -Anemia panel significant for normal iron, elevated ferritin, and B12 on the lower side at 276, continue with B12 supplements.    Code Status : Full  Family Communication  : None at bedside  Discharge planning: PT consult Pending  Consults  : None  Procedures  : None  DVT Prophylaxis  :  [place] heparin  Lab Results  Component Value Date   PLT 211 02/07/2018    Antibiotics  :   Anti-infectives (From admission, onward)   Start     Dose/Rate Route Frequency Ordered Stop   02/07/18 0930  fluconazole  (DIFLUCAN) IVPB 400 mg     400 mg 100 mL/hr over 120 Minutes Intravenous Every 24 hours 02/07/18 0928     02/05/18 1330  cefTRIAXone (ROCEPHIN) 2 g in sodium chloride 0.9 % 100 mL IVPB     2 g 200 mL/hr over 30 Minutes Intravenous Every 24 hours 02/05/18 1320          Objective:   Vitals:   02/06/18 1657 02/06/18 2107 02/07/18 0652 02/07/18 0900  BP: (!) 118/58 (!) 124/50 (!) 106/51   Pulse: 93 97 92   Resp:  18 20   Temp:  (!) 97.5 F (36.4 C) 97.8 F (36.6 C)   TempSrc:  Oral Oral   SpO2:  99% 93%   Weight:    (!) 136.1 kg    Wt Readings from Last 3 Encounters:  02/07/18 (!) 136.1 kg  12/17/17 (!) 136.1 kg  04/23/11 136.1 kg     Intake/Output Summary (Last 24 hours) at 02/07/2018 0953 Last data filed at 02/07/2018 0529 Gross per 24 hour  Intake 1355.85 ml  Output 900 ml  Net 455.85 ml     Physical Exam  Awake Alert,No new F.N deficits, Normal affect Symmetrical Chest wall movement, Good air movement bilaterally, CTAB RRR,No Gallops,Rubs or new Murmurs, No Parasternal Heave +ve B.Sounds, Abd Soft, No tenderness, No rebound - guarding or rigidity. With multiple skin ulcers and groin area, buttocks, and scaly dermatitis in upper trunk and extremities.    Data Review:    CBC Recent Labs  Lab 02/05/18 1121 02/06/18 0735 02/07/18 0420  WBC 8.9 5.4 4.8  HGB 9.2* 8.0* 8.1*  HCT 29.3* 26.1* 27.7*  PLT 229 196 211  MCV 99.3 102.0* 101.8*  MCH 31.2 31.3 29.8  MCHC 31.4 30.7 29.2*  RDW 16.4* 17.1* 17.2*  LYMPHSABS 0.7  --   --   MONOABS 0.3  --   --   EOSABS 1.2*  --   --  BASOSABS 0.0  --   --     Chemistries  Recent Labs  Lab 02/05/18 1121 02/06/18 0606 02/07/18 0420  NA 139 143 141  K 5.6* 4.9 4.6  CL 107 116* 117*  CO2 16* 17* 16*  GLUCOSE 167* 176* 94  BUN 80* 66* 54*  CREATININE 3.74* 2.65* 2.14*  CALCIUM 8.9 8.2* 8.4*  AST 43*  --   --   ALT 52*  --   --   ALKPHOS 276*  --   --   BILITOT 0.9  --   --     ------------------------------------------------------------------------------------------------------------------ No results for input(s): CHOL, HDL, LDLCALC, TRIG, CHOLHDL, LDLDIRECT in the last 72 hours.  Lab Results  Component Value Date   HGBA1C 6.7 (H) 12/18/2017   ------------------------------------------------------------------------------------------------------------------ No results for input(s): TSH, T4TOTAL, T3FREE, THYROIDAB in the last 72 hours.  Invalid input(s): FREET3 ------------------------------------------------------------------------------------------------------------------ No results for input(s): VITAMINB12, FOLATE, FERRITIN, TIBC, IRON, RETICCTPCT in the last 72 hours.  Coagulation profile No results for input(s): INR, PROTIME in the last 168 hours.  No results for input(s): DDIMER in the last 72 hours.  Cardiac Enzymes No results for input(s): CKMB, TROPONINI, MYOGLOBIN in the last 168 hours.  Invalid input(s): CK ------------------------------------------------------------------------------------------------------------------ No results found for: BNP  Inpatient Medications  Scheduled Meds: . cetaphil  1 application Topical BID  . collagenase   Topical Daily  . cyanocobalamin  1,000 mcg Intramuscular Daily  . doxepin  25 mg Oral BID  . famotidine  20 mg Oral QHS  . gabapentin  300 mg Oral TID  . heparin  5,000 Units Subcutaneous Q8H  . insulin aspart  0-9 Units Subcutaneous TID WC  . insulin glargine  10 Units Subcutaneous BID  . pravastatin  40 mg Oral Q2200  . risperiDONE  0.5 mg Oral BID  . sodium chloride flush  3 mL Intravenous Q12H   Continuous Infusions: . sodium chloride 75 mL/hr (02/07/18 0730)  . cefTRIAXone (ROCEPHIN)  IV 2 g (02/06/18 1250)  . fluconazole (DIFLUCAN) IV     PRN Meds:.acetaminophen **OR** acetaminophen, bisacodyl, hydrALAZINE, traMADol  Micro Results Recent Results (from the past 240 hour(s))  Blood  Culture (routine x 2)     Status: None (Preliminary result)   Collection Time: 02/05/18 11:21 AM  Result Value Ref Range Status   Specimen Description BLOOD LEFT ANTECUBITAL  Final   Special Requests   Final    BOTTLES DRAWN AEROBIC AND ANAEROBIC Blood Culture results may not be optimal due to an inadequate volume of blood received in culture bottles   Culture   Final    NO GROWTH 1 DAY Performed at [facility] [address] [place], [place] [address]    Report Status PENDING  Incomplete  Blood Culture (routine x 2)     Status: None (Preliminary result)   Collection Time: 02/05/18 11:26 AM  Result Value Ref Range Status   Specimen Description BLOOD LEFT ANTECUBITAL  Final   Special Requests   Final    BOTTLES DRAWN AEROBIC AND ANAEROBIC Blood Culture adequate volume   Culture   Final    NO GROWTH 1 DAY Performed at [facility], [place] [address] [place], [place] [address]    Report Status PENDING  Incomplete    Radiology Reports Dg Chest 2 View  Result Date: 02/05/2018 CLINICAL DATA:  Cough, fever EXAM: CHEST - 2 VIEW COMPARISON:  12/17/2017 FINDINGS: Cardiomegaly. No confluent airspace opacities, effusions or edema. No acute bony  abnormality. IMPRESSION: Cardiomegaly.  No active disease. Electronically Signed   By: Rolm Baptise M.D.   On: 02/05/2018 12:00   US Renal  Result Date: 02/06/2018 CLINICAL DATA:  Acute renal failure EXAM: RENAL / URINARY TRACT ULTRASOUND COMPLETE COMPARISON:  12/17/2017 CT abdomen/pelvis. FINDINGS: Examination limited by patient body habitus. Right Kidney: Renal measurements: 9.6 x 4.4 x 5.6 cm = volume: 123 mL . Echogenicity within normal limits. No mass or hydronephrosis visualized. Incidentally noted diffuse hepatic steatosis in the visualized right liver lobe. Left Kidney: Renal measurements: 9.5 x 4.5 x 4.6 cm = volume: 102 mL. Echogenicity within normal limits. No mass or hydronephrosis visualized. Bladder: Appears normal for degree of  bladder distention. IMPRESSION: Normal ultrasound of the kidneys and bladder. No hydronephrosis. Incidental diffuse hepatic steatosis. Electronically Signed   By: Ilona Sorrel M.D.   On: 02/06/2018 00:24    Phillips Climes M.D on 02/07/2018 at 9:53 AM  Between 7am to 7pm - Pager - [phone]  After 7pm go to [email or web address] - password [facility]  [facility] -  Office  [phone]

## 2018-02-07 NOTE — Consult Note (Signed)
[place] Nurse wound consult note Patient receiving care in [facility] [phone].  No family present.  Patient stated that very shortly after being discharged from the hospital "the last time", she developed a really bad rash and she itched all over.  I asked her if she was started on any new medications during that hospitalization, and she stated she was not as far as she knows.  Today her arms, lower legs, and feet are erythematous, edematous, very dry, and peeling.  All body folds are wet, have peeling skin, and scattered satellite lesions suggestive of fungal involvement.  This includes her back, gluteal fold, posterior legs, and popliteal fossa.  She screams at the lightest touch to the affected areas.  There many areas of skin impairment over the body. Reason for Consult: "back wound" Wound type: Unclear etiology.  Much of what I see looks like unspecified dermatitis, however, she does have MASD and areas presenting as an unstageable PI to the coccyx and DPTIs to the right buttock. Pressure Injury POA: Yes Measurement: The coccyx wound is 100% yellow slough and measures 2.2 cm x 5 cm.  There are 5 distinct maroon color linear marks across her right buttock that are consistent with DTPIs.  When asked if she changed her position much before she was admitted this time, she responded "no".  There are MASD to the bilateral buttocks and the anterior upper thighs. Drainage (amount, consistency, odor) The patient is weeping clear fluid from her dorsal surface that is so extensive that two Dermatherapy pads and a fitted sheet are soaked through to the mattress cover.   Periwound: Macerated and peeling, or dry and flaking. Dressing procedure/placement/frequency: Apply Santyl to coccyx wound in a nickel thick layer. Cover with a saline moistened gauze, then dry gauze or ABD pad.  Change daily. Gently cleanse all body folds with no rinse wash. Pat dry. Sprinkle antifungal powder in all folds (in the green and white bottle in clean  utility). Fold Dermatherapy pillow cases (the "silky" ones with grey lines in them) and place in all body folds including the thighs, abdomen, breasts, posterior knees.  Apply Xeroform gauzes Kellie Simmering 294) over wounds on the anterior upper thighs, cover with ABD pads.  Apply Criticaid clear (in the purple and white tube in clean utility) to the open wounds on the buttocks. I spoke at length with Dr. Waldron Labs about the patient's condition and expressed my concern that we get Infectious Diseases involved in her care, and that we consider transferring her to a facility that has Dermatology experts to guide her care. Monitor the wound area(s) for worsening of condition such as: Signs/symptoms of infection,  Increase in size,  Development of or worsening of odor, Development of pain, or increased pain at the affected locations.  Notify the medical team if any of these develop.  Thank you for the consult.  Discussed plan of care with the patient and bedside nurse.  [facility] nurse will not follow at this time.  Please re-consult the [facility] team if needed.  Val Riles, RN, MSN, CWOCN, CNS-BC, pager [phone]

## 2018-02-07 NOTE — Consult Note (Addendum)
[place] for Infectious Disease    Date of Admission:  02/05/2018     Total days of antibiotics 3  Day 3 ceftriaxone  Day 1 diflucan / vancomycin          Reason for Consult: Diffuse rash    Referring Provider: Dr. Waldron Labs Primary Care Provider: [name], MD   Assessment: 68 y.o. female with recent hospitalization over the last 60 days for community acquired pneumonia following treatment with cephalosporin, macrolide and penicillin therapy. Since discharge home she has been progressively more weak and unable to care for herself. Since discharge home she has had a widely diffuse red, flat scaled rash that is heavily involved on her back, lower abdomen and legs; does extend to her face/neck. 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  I suspect she has a systemic process going on to explain her rash - differentials could include drug rash (recent antibiotic use), vasculitis, ?hepatitis related (positive Hep A IgM recently now with AKI and rash + elevated LFTs), systemic autoimmune condition. Does not look like Kathreen Cosier syndrome. She has ulcerations at intertriginous areas likely r/t friction and moisture damage; These do not appear superinfected presently however it is difficult to see her groin. Continue wound care per [facility] RN recommendations to best avoid infection of these areas.   Would stop her antibiotics with uncertainty as to origin of rash and consider transfer to hospital where dermatology service available. Alternatively would ask general surgery to obtain punch biopsy.    ADDENDUM: 4:46 PM appears Heather Petty will be transferring to [facility] however unlikely to get a bed soon; would proceed with punch biopsy. Seems that the newest area with least risk for infection/poor healing would be right forearm, pictured below.        Plan: 1. Stop antifungal/antibiotics   2. Consider transfer to center for dermatology  3. If unable to do #2 would consult general surgery team for punch biopsy  4. OK to send in formalin for pathology only for now. Very low suspicion for infectious cause of rash.   Principal Problem:   Dermatitis Active Problems:   Open back wound   Pressure injury of skin   DM2 (diabetes mellitus, type 2) (HCC)   HTN (hypertension)   Paranoid schizophrenia ([place])   Acute renal failure (ARF) (HCC)   Hyperlipidemia associated with type 2 diabetes mellitus (HCC)   Hyperkalemia   Dysuria   . cetaphil  1 application Topical BID  . collagenase   Topical Daily  . cyanocobalamin  1,000 mcg Intramuscular Daily  . doxepin  25 mg Oral BID  . famotidine  20 mg Oral QHS  . gabapentin  300 mg Oral TID  . heparin  5,000 Units Subcutaneous Q8H  . insulin aspart  0-9 Units Subcutaneous TID WC  . insulin glargine  10 Units Subcutaneous BID  . lidocaine  15 mL Infiltration Once  . pravastatin  40 mg Oral Q2200  . risperiDONE  0.5 mg Oral BID  . silver sulfADIAZINE   Topical BID  . sodium chloride flush  3 mL Intravenous Q12H    HPI: Heather Petty is a 68 y.o. female admitted from home on 02/05/2018 with 3 week history of poor oral intake, dehydration, AKI (creatinine 3.74) and fatigue. She has a significant past medical history including type 2 diabetes, hypertension, hyperlipidemia, asthma, anemia, depression/schizophrenia.  Her ability to recall details about her health is limited and often difficult to follow her timeline of events.   Heather Petty was recently admitted 11/11 - 11/18 with community acquired pneumonia and AKI; she was treated with 5 days of IV ceftriaxone and azithromycin then transitioned to oral Augmentin for 1 more day at discharge. It was recommended at this admission that she should be discharged to SNF however her daughter declined. Heather Petty tells me that she lives at home alone but has a home aid that comes between 2-5  pm. She has been unable to clean or toilet herself since she has been home. She has needed 2 person assistance to get out of the chair during current admission she attributes to severe pain of her skin and weakness of bilateral lower extremities. She tells me initially her rash has been present for one year however Dr. Waverly Ferrari discussion with her daughter report that this started following discharge home from previous hospitalization. She is profusely itchy and scratching throughout the whole visit. Reports that she has had some diarrhea, anorexia and lower abdominal pain. She has had large volumes of drainage from bilateral groins as well as multiple folds on her back. Her nurse today reports the drainage to be with strong odor. She has not had any fevers or chills at home nor during present admission. She has received 3 days of ceftriaxone therapy and 1 dose of vancomycin for presumed sepsis related to cellulitis or infection of wound.  Review of Systems  Constitutional: Positive for diaphoresis and malaise/fatigue. Negative for chills, fever and weight loss.  HENT: Negative for sore throat and tinnitus.   Eyes: Negative for blurred vision, photophobia, discharge and redness.  Respiratory: Negative for cough, sputum production and shortness of breath.   Cardiovascular: Positive for leg swelling. Negative for chest pain.  Gastrointestinal: Positive for diarrhea. Negative for abdominal pain, nausea and vomiting.  Genitourinary: Negative for dysuria.  Musculoskeletal: Negative for myalgias.  Skin: Positive for itching and rash.  Neurological: Negative for headaches.    Past Medical History:  Diagnosis Date  . AKI (acute kidney injury) ([place]) 12/17/2017  . Arthritis   . Asthma   . Depression   . Diabetes mellitus   . Gout   . High cholesterol   . Hypertension   . Morbid obesity ([place])   . Pneumonia 12/17/2017  . Schizophrenia ([place])     Social History   Tobacco Use  . Smoking status:  Former Research scientist (life sciences)  . Smokeless tobacco: Never Used  Substance Use Topics  . Alcohol use: No  . Drug use: No    Family History  Family history unknown: Yes   Allergies  Allergen Reactions  . Peanut-Containing Drug Products Other (See Comments)    Questionable?? May have outgrown it??    OBJECTIVE: Blood pressure 106/74, pulse (!) 102, temperature (!) 97.3 F (36.3 C), resp. rate 16, weight (!) 136.1 kg, SpO2 98 %.  Physical Exam Constitutional:      Appearance: She is obese.     Comments: Seated in recliner. PT and RN working with her to reposition -  Profoundly weak and seems to be in a lot of pain.   HENT:     Mouth/Throat:     Mouth: Mucous membranes are moist.     Pharynx: No oropharyngeal exudate.     Comments: Atrophic glossitis with loss of papillae. Small circular erosions on right lateral tongue.  Eyes:     General: No scleral icterus.  Conjunctiva/sclera: Conjunctivae normal.  Cardiovascular:     Rate and Rhythm: Normal rate.     Heart sounds: Murmur present.  Pulmonary:     Effort: Pulmonary effort is normal.     Breath sounds: Normal breath sounds. No wheezing.  Abdominal:     General: There is no distension.     Palpations: Abdomen is soft.     Tenderness: There is no abdominal tenderness.  Musculoskeletal:     Right lower leg: Edema present.     Left lower leg: Edema present.  Skin:    General: Skin is dry.     Findings: Rash (scaled/flaking rash with erythematous base noted heavily on her lower back, legs and inner arms. Flat. She has scattered areas of erosions and full thickness skin loss in folds of groin and back. Perineum c/w moisture associated damage r/t incontinence. ) present.  Neurological:     Mental Status: She is alert.            Lab Results Lab Results  Component Value Date   WBC 4.8 02/07/2018   HGB 8.1 (L) 02/07/2018   HCT 27.7 (L) 02/07/2018   MCV 101.8 (H) 02/07/2018   PLT 211 02/07/2018    Lab Results  Component  Value Date   CREATININE 2.14 (H) 02/07/2018   BUN 54 (H) 02/07/2018   NA 141 02/07/2018   K 4.6 02/07/2018   CL 117 (H) 02/07/2018   CO2 16 (L) 02/07/2018    Lab Results  Component Value Date   ALT 52 (H) 02/05/2018   AST 43 (H) 02/05/2018   ALKPHOS 276 (H) 02/05/2018   BILITOT 0.9 02/05/2018     Microbiology: Recent Results (from the past 240 hour(s))  Blood Culture (routine x 2)     Status: None (Preliminary result)   Collection Time: 02/05/18 11:21 AM  Result Value Ref Range Status   Specimen Description BLOOD LEFT ANTECUBITAL  Final   Special Requests   Final    BOTTLES DRAWN AEROBIC AND ANAEROBIC Blood Culture results may not be optimal due to an inadequate volume of blood received in culture bottles   Culture   Final    NO GROWTH 2 DAYS Performed at [facility] [address] [place], [place] [address]    Report Status PENDING  Incomplete  Blood Culture (routine x 2)     Status: None (Preliminary result)   Collection Time: 02/05/18 11:26 AM  Result Value Ref Range Status   Specimen Description BLOOD LEFT ANTECUBITAL  Final   Special Requests   Final    BOTTLES DRAWN AEROBIC AND ANAEROBIC Blood Culture adequate volume   Culture   Final    NO GROWTH 2 DAYS Performed at [facility], [place] [address], [place], [place] [address]    Report Status PENDING  Incomplete    Janene Madeira, MSN, NP-C [facility] for Infectious [place] Cell: [phone] Pager: [phone]  02/07/2018 3:41 PM

## 2018-02-07 NOTE — Progress Notes (Signed)
VAST consulted for PIV placement. # 22 gauge RFA placed X1 attempt. +BR, + flush without resistance. Patient with skin rash on forearm requiring emollient cream resulting in difficulty in transparent dressing adhering to skin. Site cleaned and dried. Dressing placed and secured with medipore tape and kerlex. Insertion site visible.

## 2018-02-07 NOTE — Progress Notes (Signed)
Pharmacy Antibiotic Note  Heather Petty is a 68 y.o. female admitted on 02/05/2018 with dehydration, fatigue and FTT.  Patient has a diffuse rash and back wound has purulent discharge.  Pharmacy has been consulted for vancomycin dosing.  She has already been started on Rocephin and fluconazole is added today.  Patient has AKI with improving SCr.  Afebrile, WBC WNL, LA 1.5.   Plan: Vanc 2gm IV x 1, then 1gm IV Q36H for AUC 480 using SCr 2.14 Fluc 400mg  IV x 1, then 200mg  IV Q24H CTX 2gm IV Q24H per MD Monitor renal fxn, clinical progress, vanc AUC as indicated   Weight: (!) 300 lb 0.7 oz (136.1 kg)  Temp (24hrs), Avg:97.6 F (36.4 C), Min:97.4 F (36.3 C), Max:97.8 F (36.6 C)  Recent Labs  Lab 02/05/18 1121 02/05/18 1249 02/05/18 1526 02/06/18 0606 02/06/18 0735 02/07/18 0420  WBC 8.9  --   --   --  5.4 4.8  CREATININE 3.74*  --   --  2.65*  --  2.14*  LATICACIDVEN  --  2.60* 1.50  --   --   --     Estimated Creatinine Clearance: 33.6 mL/min (A) (by C-G formula based on SCr of 2.14 mg/dL (H)).    Allergies  Allergen Reactions  . Peanut-Containing Drug Products Other (See Comments)    Questionable?? May have outgrown it??    Vanc 1/2 >> CTX 12/31 >> Fluc 1/2 >>  12/31 BCx -    Jahseh Lucchese D. Mina Marble, PharmD, BCPS, [place] 02/07/2018, 9:51 AM

## 2018-02-08 DIAGNOSIS — Z91010 Allergy to peanuts: Secondary | ICD-10-CM | POA: Diagnosis not present

## 2018-02-08 DIAGNOSIS — L299 Pruritus, unspecified: Secondary | ICD-10-CM

## 2018-02-08 DIAGNOSIS — R21 Rash and other nonspecific skin eruption: Secondary | ICD-10-CM

## 2018-02-08 DIAGNOSIS — R627 Adult failure to thrive: Secondary | ICD-10-CM | POA: Diagnosis not present

## 2018-02-08 DIAGNOSIS — N179 Acute kidney failure, unspecified: Secondary | ICD-10-CM | POA: Diagnosis not present

## 2018-02-08 DIAGNOSIS — L309 Dermatitis, unspecified: Secondary | ICD-10-CM | POA: Diagnosis not present

## 2018-02-08 LAB — GLUCOSE, CAPILLARY
Glucose-Capillary: 70 mg/dL (ref 70–99)
Glucose-Capillary: 107 mg/dL — ABNORMAL HIGH (ref 70–99)
Glucose-Capillary: 140 mg/dL — ABNORMAL HIGH (ref 70–99)
Glucose-Capillary: 136 mg/dL — ABNORMAL HIGH (ref 70–99)

## 2018-02-08 LAB — BASIC METABOLIC PANEL
Anion gap: 7 (ref 5–15)
BUN: 38 mg/dL — ABNORMAL HIGH (ref 8–23)
CHLORIDE: 118 mmol/L — AB (ref 98–111)
CO2: 15 mmol/L — ABNORMAL LOW (ref 22–32)
Calcium: 8.3 mg/dL — ABNORMAL LOW (ref 8.9–10.3)
Creatinine, Ser: 1.4 mg/dL — ABNORMAL HIGH (ref 0.44–1.00)
GFR calc Af Amer: 44 mL/min — ABNORMAL LOW (ref >60)
GFR calc non Af Amer: 38 mL/min — ABNORMAL LOW (ref >60)
Glucose, Bld: 111 mg/dL — ABNORMAL HIGH (ref 70–99)
Potassium: 4.8 mmol/L (ref 3.5–5.1)
Sodium: 140 mmol/L (ref 135–145)

## 2018-02-08 LAB — CBC
HCT: 25.6 % — ABNORMAL LOW (ref 36.0–46.0)
Hemoglobin: 8.3 g/dL — ABNORMAL LOW (ref 12.0–15.0)
MCH: 32 pg (ref 26.0–34.0)
MCHC: 32.4 g/dL (ref 30.0–36.0)
MCV: 98.8 fL (ref 80.0–100.0)
Platelets: 173 10*3/uL (ref 150–400)
RBC: 2.59 MIL/uL — ABNORMAL LOW (ref 3.87–5.11)
RDW: 17.2 % — ABNORMAL HIGH (ref 11.5–15.5)
WBC: 5.8 10*3/uL (ref 4.0–10.5)
nRBC: 0.3 % — ABNORMAL HIGH (ref 0.0–0.2)

## 2018-02-08 MED ORDER — LIDOCAINE HCL (CARDIAC) PF 100 MG/5ML IV SOSY
PREFILLED_SYRINGE | INTRAVENOUS | Status: AC
  Filled 2018-02-08: qty 5

## 2018-02-08 NOTE — Progress Notes (Signed)
Received call from [facility] that patient has a bed available (Heather Petty). Report called to Simona Huh, RN at [facility]. PTAR set up for patient.

## 2018-02-08 NOTE — Progress Notes (Signed)
[place] [facility] for Infectious Disease  Date of Admission:  02/05/2018     Total days of antibiotics Stopped 02/07/18        ASSESSMENT: Heather Petty is a little improved this morning. Does not describe the rash to be as itchy and her diarrhea is resolved since stopping antibiotics. She underwent punch biopsy of the right forearm and awaiting pathology read to help identify cause. Continue are to ulcerated areas per [facility] team recommendations. Would continue to hold antibiotics. Pending transfer to [facility]  PLAN: 1. Await punch biopsy results  2. Wound care per primary team/WOC   Dr. Johnnye Sima is available as needed over the weekend if there is concern/question related to ID.   Principal Problem:   Dermatitis Active Problems:   Open back wound   Pressure injury of skin   DM2 (diabetes mellitus, type 2) (HCC)   HTN (hypertension)   Paranoid schizophrenia ([place])   Acute renal failure (ARF) (HCC)   Hyperlipidemia associated with type 2 diabetes mellitus (HCC)   Hyperkalemia   Dysuria   . cetaphil  1 application Topical BID  . collagenase   Topical Daily  . cyanocobalamin  1,000 mcg Intramuscular Daily  . doxepin  25 mg Oral BID  . famotidine  20 mg Oral QHS  . gabapentin  300 mg Oral TID  . heparin  5,000 Units Subcutaneous Q8H  . insulin aspart  0-9 Units Subcutaneous TID WC  . insulin glargine  10 Units Subcutaneous BID  . lidocaine  15 mL Infiltration Once  . pravastatin  40 mg Oral Q2200  . risperiDONE  0.5 mg Oral BID  . silver sulfADIAZINE   Topical BID  . sodium chloride flush  3 mL Intravenous Q12H    SUBJECTIVE: Diarrhea has stopped. Still very weak    Allergies  Allergen Reactions  . Peanut-Containing Drug Products Other (See Comments)    Questionable?? May have outgrown it??    OBJECTIVE: Vitals:   02/07/18 0900 02/07/18 1449 02/08/18 0135 02/08/18 0514  BP:  106/74 (!) 97/46 (!) 106/34  Pulse:  (!) 102 97 100  Resp:  16  18  Temp:  (!) 97.3 F  (36.3 C) 97.6 F (36.4 C) 97.9 F (36.6 C)  TempSrc:   Oral   SpO2:  98% 100% 100%  Weight: (!) 136.1 kg      Body mass index is 53.15 kg/m.  Physical Exam Vitals signs and nursing note reviewed.  Constitutional:      Appearance: She is well-developed.     Comments: Seated comfortably in chair.   HENT:     Mouth/Throat:     Mouth: No oral lesions.     Dentition: Normal dentition. No dental abscesses.  Cardiovascular:     Rate and Rhythm: Normal rate and regular rhythm.  Pulmonary:     Effort: Pulmonary effort is normal.  Skin:    General: Skin is warm and dry.     Findings: No rash.     Comments: Redness improved. Flaking still present.   Neurological:     Mental Status: She is alert and oriented to person, place, and time.    Lab Results Lab Results  Component Value Date   WBC 5.8 02/08/2018   HGB 8.3 (L) 02/08/2018   HCT 25.6 (L) 02/08/2018   MCV 98.8 02/08/2018   PLT 173 02/08/2018    Lab Results  Component Value Date   CREATININE 1.40 (H) 02/08/2018   BUN  38 (H) 02/08/2018   NA 140 02/08/2018   K 4.8 02/08/2018   CL 118 (H) 02/08/2018   CO2 15 (L) 02/08/2018    Lab Results  Component Value Date   ALT 52 (H) 02/05/2018   AST 43 (H) 02/05/2018   ALKPHOS 276 (H) 02/05/2018   BILITOT 0.9 02/05/2018     Microbiology: Recent Results (from the past 240 hour(s))  Blood Culture (routine x 2)     Status: None (Preliminary result)   Collection Time: 02/05/18 11:21 AM  Result Value Ref Range Status   Specimen Description BLOOD LEFT ANTECUBITAL  Final   Special Requests   Final    BOTTLES DRAWN AEROBIC AND ANAEROBIC Blood Culture results may not be optimal due to an inadequate volume of blood received in culture bottles   Culture   Final    NO GROWTH 3 DAYS Performed at [facility], [place] [address] [place], [place] [address]    Report Status PENDING  Incomplete  Blood Culture (routine x 2)     Status: None (Preliminary result)   Collection Time:  02/05/18 11:26 AM  Result Value Ref Range Status   Specimen Description BLOOD LEFT ANTECUBITAL  Final   Special Requests   Final    BOTTLES DRAWN AEROBIC AND ANAEROBIC Blood Culture adequate volume   Culture   Final    NO GROWTH 3 DAYS Performed at [facility], [place] [address], [place], [place] [address]    Report Status PENDING  Incomplete    Janene Madeira, MSN, NP-C [facility] [place] Cell: [phone] Pager: [phone]  02/08/2018  1:47 PM

## 2018-02-08 NOTE — Progress Notes (Signed)
Pt taken by [facility] transportation. Pt alert and oriented, VS stable, no acute distress. Pt daughter notified about transport and with pt agreement notified about Hospital and room number pt is being transferred to.  Pt had one backpack and two pt belongings bags. Trans. Could not take all belongings due to limited space in transportation vehicle. Backpack taken with pt, and begs labeled and left at dirty utility room. Pt daughter Nyoka Cowden, C notified.

## 2018-02-08 NOTE — Progress Notes (Signed)
PROGRESS NOTE                                                                                                                                                                                                             Patient Demographics:    Heather Petty, is a 68 y.o. female, DOB - Sep 03, 1950, QQP:[id number]  Admit date - 02/05/2018   Admitting Physician Lenore Cordia, MD  Outpatient Primary MD for the patient is [name], MD  LOS - 3   Chief Complaint  Patient presents with  . Fall  . Weakness  . Failure To Thrive       Brief Narrative     68 y.o. female with medical history significant for T2DM, HTN, HLD, Asthma, Anemia, Depression/Schizophrenia who presents the ED from home with reported progressive failure to thrive with poor oral intake ongoing over the last 3 weeks.  She was admitted from 12/17/2017-12/24/2017 for community-acquired pneumonia and AKI.  She was discharged on Augmentin, she reports generalized fatigue and dry flaky skin all over her body.   Work-up was significant for acute AKI with a creatinine of 3.5, admitted for further management, she was noted to have significant scaly rash, with significant skin ulceration, and buttocks area, groin areas.    Subjective:    Heather Petty today denies any fever or chills,    Assessment  & Plan :    Principal Problem:   Dermatitis Active Problems:   DM2 (diabetes mellitus, type 2) ([place])   HTN (hypertension)   Paranoid schizophrenia ([place])   Acute renal failure (ARF) ([place])   Hyperlipidemia associated with type 2 diabetes mellitus (HCC)   Hyperkalemia   Open back wound   Dysuria   Pressure injury of skin   Acute renal failure: -Creatinine elevated 3.7 on admission, from baseline which is around 1, renal failure-most likely in the setting of ATN, from dehydration, from poor oral intake, and fluid loss through her wounds, while she was on hydrochlorothiazide,  Lasix, benazepril, and meloxicam . -Creatinine function improving, it is 1.4 today, will decrease her fluid to 50 cc/h  -Avoid nephrotoxic medications  Diffuse dermatitis, with multiple skin wounds in the groin buttock area lower back area -Discussed with the daughter, ulcer and which started around a month ago, given penicillin course for hemodialysis by her PCP, no improvement -  Initially on broad-spectrum antibiotic coverage, Rocephin, vancomycin, antifungal with Diflucan, ID input greatly appreciated, coverage has been stopped 02/07/2018  -Surgery consulted for skin biopsy  -Have discussed with [facility], discussed with Dr. Lavone Neri from dermatology, it is appropriate for transfer to hospitalist service for further work-up , awaiting bed availability  -Continue with local wound care including Silvadene .   Dysuria: - Reports dysuria and suprapubic pain on admission, her UA is negative, continue to monitor  Hyperkalemia: -Potassium 5.6 on admission, in the setting of AKI, resolved, it is 4.8 today  Type 2 diabetes: A1c 6.7 on 12/18/2017.  She is on Lantus 20 units twice daily and Actos at home. -Reduce home Lantus 10 units twice daily, sensitive SSI BG is controlled on this regimen  Hypertension: -Patient remains soft, continue to hold home meds including hydrochlorothiazide and benazepril   Hyperlipidemia: -Continue pravastatin  Depression/schizophrenia: -Continue risperidone 0.5 mg p.o. twice daily and doxepin  Chronic pain: -Continue tramadol -DC meloxicam  Anemia of chronic disease -baseline  hemoglobin around 7.9, it was 9 due to dehydration on admission, it did drop back to 8 today, most likely dilutional. -Anemia panel significant for normal iron, elevated ferritin, and B12 on the lower side at 276, continue with B12 supplements.    Code Status : Full  Family Communication  : None at bedside  Discharge planning: accepted for transfer at [facility]  : ID  Procedures  : None  DVT Prophylaxis  :  [place] heparin  Lab Results  Component Value Date   PLT 173 02/08/2018    Antibiotics  :   Anti-infectives (From admission, onward)   Start     Dose/Rate Route Frequency Ordered Stop   02/08/18 2300  vancomycin (VANCOCIN) IVPB 1000 mg/200 mL premix  Status:  Discontinued     1,000 mg 200 mL/hr over 60 Minutes Intravenous Every 36 hours 02/07/18 0957 02/07/18 1524   02/08/18 1100  fluconazole (DIFLUCAN) IVPB 200 mg  Status:  Discontinued     200 mg 100 mL/hr over 60 Minutes Intravenous Every 24 hours 02/07/18 0957 02/07/18 1524   02/07/18 1000  vancomycin (VANCOCIN) 2,000 mg in sodium chloride 0.9 % 500 mL IVPB     2,000 mg 250 mL/hr over 120 Minutes Intravenous  Once 02/07/18 0957 02/07/18 1959   02/07/18 1000  fluconazole (DIFLUCAN) IVPB 400 mg     400 mg 100 mL/hr over 120 Minutes Intravenous  Once 02/07/18 0957 02/07/18 1959   02/07/18 0930  fluconazole (DIFLUCAN) IVPB 400 mg  Status:  Discontinued     400 mg 100 mL/hr over 120 Minutes Intravenous Every 24 hours 02/07/18 0928 02/07/18 0957   02/05/18 1330  cefTRIAXone (ROCEPHIN) 2 g in sodium chloride 0.9 % 100 mL IVPB  Status:  Discontinued     2 g 200 mL/hr over 30 Minutes Intravenous Every 24 hours 02/05/18 1320 02/07/18 1524        Objective:   Vitals:   02/07/18 0900 02/07/18 1449 02/08/18 0135 02/08/18 0514  BP:  106/74 (!) 97/46 (!) 106/34  Pulse:  (!) 102 97 100  Resp:  16  18  Temp:  (!) 97.3 F (36.3 C) 97.6 F (36.4 C) 97.9 F (36.6 C)  TempSrc:   Oral   SpO2:  98% 100% 100%  Weight: (!) 136.1 kg       Wt Readings from Last 3 Encounters:  02/07/18 (!) 136.1 kg  12/17/17 (!) 136.1 kg  04/23/11 136.1 kg     Intake/Output Summary (Last 24 hours) at 02/08/2018 1250 Last data filed at 02/08/2018 0513 Gross per 24 hour  Intake 2099.81 ml  Output 500 ml  Net 1599.81 ml     Physical Exam  Awake Alert, comfortably, denies any  complaints Symmetrical Chest wall movement, Good air movement bilaterally, CTAB RRR,No Gallops,Rubs or new Murmurs, No Parasternal Heave +ve B.Sounds, Abd Soft, No tenderness, No rebound - guarding or rigidity. No Cyanosis, Clubbing or edema, diffuse red scaly rash in upper trunk    Data Review:    CBC Recent Labs  Lab 02/05/18 1121 02/06/18 0735 02/07/18 0420 02/08/18 0424  WBC 8.9 5.4 4.8 5.8  HGB 9.2* 8.0* 8.1* 8.3*  HCT 29.3* 26.1* 27.7* 25.6*  PLT 229 196 211 173  MCV 99.3 102.0* 101.8* 98.8  MCH 31.2 31.3 29.8 32.0  MCHC 31.4 30.7 29.2* 32.4  RDW 16.4* 17.1* 17.2* 17.2*  LYMPHSABS 0.7  --   --   --   MONOABS 0.3  --   --   --   EOSABS 1.2*  --   --   --   BASOSABS 0.0  --   --   --     Chemistries  Recent Labs  Lab 02/05/18 1121 02/06/18 0606 02/07/18 0420 02/08/18 0424  NA 139 143 141 140  K 5.6* 4.9 4.6 4.8  CL 107 116* 117* 118*  CO2 16* 17* 16* 15*  GLUCOSE 167* 176* 94 111*  BUN 80* 66* 54* 38*  CREATININE 3.74* 2.65* 2.14* 1.40*  CALCIUM 8.9 8.2* 8.4* 8.3*  AST 43*  --   --   --   ALT 52*  --   --   --   ALKPHOS 276*  --   --   --   BILITOT 0.9  --   --   --    ------------------------------------------------------------------------------------------------------------------ No results for input(s): CHOL, HDL, LDLCALC, TRIG, CHOLHDL, LDLDIRECT in the last 72 hours.  Lab Results  Component Value Date   HGBA1C 6.7 (H) 12/18/2017   ------------------------------------------------------------------------------------------------------------------ No results for input(s): TSH, T4TOTAL, T3FREE, THYROIDAB in the last 72 hours.  Invalid input(s): FREET3 ------------------------------------------------------------------------------------------------------------------ No results for input(s): VITAMINB12, FOLATE, FERRITIN, TIBC, IRON, RETICCTPCT in the last 72 hours.  Coagulation profile No results for input(s): INR, PROTIME in the last 168  hours.  No results for input(s): DDIMER in the last 72 hours.  Cardiac Enzymes No results for input(s): CKMB, TROPONINI, MYOGLOBIN in the last 168 hours.  Invalid input(s): CK ------------------------------------------------------------------------------------------------------------------ No results found for: BNP  Inpatient Medications  Scheduled Meds: . lidocaine (cardiac) 100 mg/33m      . cetaphil  1 application Topical BID  . collagenase   Topical Daily  . cyanocobalamin  1,000 mcg Intramuscular Daily  . doxepin  25 mg Oral BID  . famotidine  20 mg Oral QHS  . gabapentin  300 mg Oral TID  . heparin  5,000 Units Subcutaneous Q8H  . insulin aspart  0-9 Units Subcutaneous TID WC  . insulin glargine  10 Units Subcutaneous BID  . lidocaine  15 mL Infiltration Once  . pravastatin  40 mg Oral Q2200  . risperiDONE  0.5 mg Oral BID  . silver sulfADIAZINE   Topical BID  . sodium chloride flush  3 mL Intravenous Q12H   Continuous Infusions: . sodium chloride 75 mL/hr at 02/08/18 0927   PRN Meds:.acetaminophen **OR** acetaminophen, bisacodyl, hydrALAZINE, traMADol  Micro Results Recent Results (from the past  240 hour(s))  Blood Culture (routine x 2)     Status: None (Preliminary result)   Collection Time: 02/05/18 11:21 AM  Result Value Ref Range Status   Specimen Description BLOOD LEFT ANTECUBITAL  Final   Special Requests   Final    BOTTLES DRAWN AEROBIC AND ANAEROBIC Blood Culture results may not be optimal due to an inadequate volume of blood received in culture bottles   Culture   Final    NO GROWTH 3 DAYS Performed at [facility], [place] [address], [place], [place] [address]    Report Status PENDING  Incomplete  Blood Culture (routine x 2)     Status: None (Preliminary result)   Collection Time: 02/05/18 11:26 AM  Result Value Ref Range Status   Specimen Description BLOOD LEFT ANTECUBITAL  Final   Special Requests   Final    BOTTLES DRAWN AEROBIC AND  ANAEROBIC Blood Culture adequate volume   Culture   Final    NO GROWTH 3 DAYS Performed at [facility], [place] [address] [place], [place] [address]    Report Status PENDING  Incomplete    Radiology Reports Dg Chest 2 View  Result Date: 02/05/2018 CLINICAL DATA:  Cough, fever EXAM: CHEST - 2 VIEW COMPARISON:  12/17/2017 FINDINGS: Cardiomegaly. No confluent airspace opacities, effusions or edema. No acute bony abnormality. IMPRESSION: Cardiomegaly.  No active disease. Electronically Signed   By: Rolm Baptise M.D.   On: 02/05/2018 12:00   US Renal  Result Date: 02/06/2018 CLINICAL DATA:  Acute renal failure EXAM: RENAL / URINARY TRACT ULTRASOUND COMPLETE COMPARISON:  12/17/2017 CT abdomen/pelvis. FINDINGS: Examination limited by patient body habitus. Right Kidney: Renal measurements: 9.6 x 4.4 x 5.6 cm = volume: 123 mL . Echogenicity within normal limits. No mass or hydronephrosis visualized. Incidentally noted diffuse hepatic steatosis in the visualized right liver lobe. Left Kidney: Renal measurements: 9.5 x 4.5 x 4.6 cm = volume: 102 mL. Echogenicity within normal limits. No mass or hydronephrosis visualized. Bladder: Appears normal for degree of bladder distention. IMPRESSION: Normal ultrasound of the kidneys and bladder. No hydronephrosis. Incidental diffuse hepatic steatosis. Electronically Signed   By: Ilona Sorrel M.D.   On: 02/06/2018 00:24    Phillips Climes M.D on 02/08/2018 at 12:50 PM  Between 7am to 7pm - Pager - [phone]  After 7pm go to [email or web address] - password [facility]  [facility] -  Office  [phone]

## 2018-02-08 NOTE — Progress Notes (Signed)
Report called by Phoebe Perch, RN to [facility], RN [facility]  phone # [phone]. Charge Nurse phone # [phone]. Pt notified and agreed with transfer. Schorr, NP notified to fill out EMTALA form.

## 2018-02-08 NOTE — Care Management Important Message (Signed)
Important Message  Patient Details  Name: Heather Petty MRN: [id number] Date of Birth: [birth year]   Medicare Important Message Given:  Yes    Godson Pollan [address] 02/08/2018, 3:22 PM

## 2018-02-08 NOTE — Progress Notes (Signed)
Report given to transporting RN.  

## 2018-02-08 NOTE — Progress Notes (Addendum)
Procedure note: Chief complaint: Rash Skin biopsy: The procedure was explained to the patient in detail.  All questions were answered.  Operative consent was obtained.  Procedure: An area of rash was picked on the right forearm based on ID recommendations.  The area was cleaned with 3 Betadine swabs.  Area was dried.  4 mL of 2% plain lidocaine was infiltrated into the site.  When anesthesia was effective a 4 mm punch was used to obtain a skin sample.  It was placed in formalin.  The site was controlled with pressure and bleeding was controlled.  A Xeroform/dry sterile dressing was placed over the site.  Patient tolerated the procedure well.  We will take the specimen to the laboratory.

## 2018-02-09 DIAGNOSIS — E119 Type 2 diabetes mellitus without complications: Secondary | ICD-10-CM | POA: Insufficient documentation

## 2018-02-09 DIAGNOSIS — F209 Schizophrenia, unspecified: Secondary | ICD-10-CM | POA: Insufficient documentation

## 2018-02-09 MED ORDER — CYANOCOBALAMIN 1000 MCG/ML IJ SOLN: 1000.0000 ug | INTRAMUSCULAR | 0 refills | Status: DC

## 2018-02-09 MED ORDER — COLLAGENASE 250 UNIT/GM EX OINT: TOPICAL_OINTMENT | Freq: Every day | CUTANEOUS | 0 refills | Status: DC

## 2018-02-09 MED ORDER — SILVER SULFADIAZINE 1 % EX CREA: TOPICAL_CREAM | Freq: Two times a day (BID) | CUTANEOUS | 0 refills | Status: DC

## 2018-02-09 NOTE — Discharge Instructions (Signed)
Management per [facility]

## 2018-02-09 NOTE — Discharge Summary (Signed)
Heather Petty, is a 68 y.o. female  DOB 1950-04-17  MRN [id number].  Admission date:  02/05/2018  Admitting Physician  Lenore Cordia, MD  Discharge Date:  02/09/2018   Primary MD  [name], MD  Recommendations for primary care physician for things to follow:  - Patient  transferred to [facility]   Admission Diagnosis  Adult failure to thrive [R62.7] Acute renal failure (ARF) ([place]) [N17.9] Acute kidney injury ([place]) [N17.9] Skin ulcer, unspecified ulcer stage ([place]) [L98.499]   Discharge Diagnosis  Adult failure to thrive [R62.7] Acute renal failure (ARF) ([place]) [N17.9] Acute kidney injury ([place]) [N17.9] Skin ulcer, unspecified ulcer stage ([place]) [L98.499]   Principal Problem:   Dermatitis Active Problems:   DM2 (diabetes mellitus, type 2) ([place])   HTN (hypertension)   Paranoid schizophrenia ([place])   Acute renal failure (ARF) ([place])   Hyperlipidemia associated with type 2 diabetes mellitus ([place])   Hyperkalemia   Open back wound   Dysuria   Pressure injury of skin      Past Medical History:  Diagnosis Date  . AKI (acute kidney injury) ([place]) 12/17/2017  . Arthritis   . Asthma   . Depression   . Diabetes mellitus   . Gout   . High cholesterol   . Hypertension   . Morbid obesity ([place])   . Pneumonia 12/17/2017  . Schizophrenia [facility])     Past Surgical History:  Procedure Laterality Date  . CYST EXCISION    . DENTAL SURGERY    . TONSILLECTOMY         History of present illness and  Hospital Course:     Kindly see H&P for history of present illness and admission details, please review complete Labs, Consult reports and Test reports for all details in brief  HPI  from the history and physical done on the day of admission    Hospital Course  68 y.o.femalewith medical history significant forT2DM, HTN, HLD, Asthma, Anemia,  Depression/Schizophreniawho presents the ED from home with reported progressive failure to thrive with poor oral intake ongoing over the last 3 weeks. She was admitted from 12/17/2017-12/24/2017 for community-acquired pneumonia and AKI.  She was discharged on Augmentin, she reports generalized fatigue and dry flaky skin all over her body.  Work-up was significant for acute AKI with a creatinine of 3.5, admitted for further management, she was noted to have significant scaly rash, with significant skin ulceration, and buttocks area, groin areas.  Acute renal failure: -Creatinine elevated 3.7 on admission, from baseline which is around 1, renal failure-most likely in the setting of ATN, from dehydration, from poor oral intake, and fluid loss through her wounds, while she was on hydrochlorothiazide, Lasix, benazepril, and meloxicam . -Creatinine function improving, it is 1.4  -Avoid nephrotoxic medications  Diffuse dermatitis, with multiple skin wounds in the groin buttock area lower back area -Discussed with the daughter, ulcer and which started around a month ago, given penicillin course for hemodialysis by her PCP, no improvement -Initially on  broad-spectrum antibiotic coverage, Rocephin, vancomycin, antifungal with Diflucan, ID input greatly appreciated, coverage has been stopped 02/07/2018  -Surgery consulted for skin biopsy  punch biopsy was performed 02/08/2018 -Have discussed with [facility], discussed with Dr. Lavone Neri from dermatology, it is appropriate for transfer to hospitalist service for further work-up , -Continue with local wound care including Silvadene .  Dysuria: - Reports dysuria and suprapubic pain on admission, her UA is negative, continue to monitor  Hyperkalemia: -Potassium 5.6 on admission, in the setting of AKI, resolved  Type 2 diabetes: A1c 6.7 on 12/18/2017. She is on Lantus 20 units twice daily and Actos at home. -Reduce home Lantus 10 units  twice daily, sensitive SSI BG is controlled on this regimen  Hypertension: -Patient remains soft, continue to hold home meds including hydrochlorothiazide and benazepril   Hyperlipidemia: -Continue pravastatin  Depression/schizophrenia: -Continue risperidone 0.5 mg p.o. twice daily and doxepin  Chronic pain: -Continue tramadol -DC meloxicam  Anemia of chronic disease -baseline  hemoglobin around 7.9, it was 9 due to dehydration on admission, it did drop back to 8 today, most likely dilutional. -Anemia panel significant for normal iron, elevated ferritin, and B12 on the lower side at 276, continue with B12 supplements.     Discharge Condition:  stable for transfer    Discharge Instructions  and  Discharge Medications    Discharge Instructions    Discharge instructions   Complete by:  As directed    Management per [facility]   Increase activity slowly   Complete by:  As directed      Allergies as of 02/08/2018      Reactions   Peanut-containing Drug Products Other (See Comments)   Questionable?? May have outgrown it??      Medication List    STOP taking these medications   allopurinol 300 MG tablet Commonly known as:  ZYLOPRIM   benazepril 20 MG tablet Commonly known as:  LOTENSIN   EPIPEN 2-PAK 0.3 mg/0.3 mL Soaj injection Generic drug:  EPINEPHrine   furosemide 40 MG tablet Commonly known as:  LASIX   hydrochlorothiazide 12.5 MG capsule Commonly known as:  MICROZIDE   pioglitazone 30 MG tablet Commonly known as:  ACTOS     TAKE these medications   acetaminophen 325 MG tablet Commonly known as:  TYLENOL Take 650 mg by mouth every 6 (six) hours as needed for mild pain.   bisacodyl 5 MG EC tablet Commonly known as:  DULCOLAX Take 1 tablet (5 mg total) by mouth daily as needed for moderate constipation.   cetirizine 10 MG tablet Commonly known as:  ZYRTEC Take 10 mg by mouth daily as needed for allergies.     collagenase ointment Commonly known as:  SANTYL Apply topically daily.   cyanocobalamin 1000 MCG/ML injection Commonly known as:  (VITAMIN B-12) Inject 1 mL (1,000 mcg total) into the muscle once a week.   doxepin 25 MG capsule Commonly known as:  SINEQUAN Take 25 mg by mouth 2 (two) times daily.   DYMISTA 137-50 MCG/ACT Susp Generic drug:  Azelastine-Fluticasone Place 1 spray into the nose as needed (allergies).   famotidine 20 MG tablet Commonly known as:  PEPCID Take 20 mg by mouth 2 (two) times daily.   gabapentin 600 MG tablet Commonly known as:  NEURONTIN Take 0.5 tablets (300 mg total) by mouth 3 (three) times daily.   glipiZIDE 10 MG 24 hr tablet Commonly known as:  GLUCOTROL XL Take 10 mg by  mouth 2 (two) times daily.   insulin glargine 100 UNIT/ML injection Commonly known as:  LANTUS Inject 0.2 mLs (20 Units total) into the skin 2 (two) times daily.   multivitamin tablet Take 1 tablet by mouth daily.   polyethylene glycol packet Commonly known as:  MIRALAX / GLYCOLAX Take 17 g by mouth 2 (two) times daily.   potassium chloride 10 MEQ tablet Commonly known as:  K-DUR Take 10 mEq by mouth daily.   pravastatin 40 MG tablet Commonly known as:  PRAVACHOL Take 40 mg by mouth daily.   risperiDONE 0.5 MG tablet Commonly known as:  RISPERDAL Take 1 tablet (0.5 mg total) by mouth 2 (two) times daily.   silver sulfADIAZINE 1 % cream Commonly known as:  SILVADENE Apply topically 2 (two) times daily.   traMADol 50 MG tablet Commonly known as:  ULTRAM Take 50 mg by mouth every 6 (six) hours as needed for moderate pain.   Vitamin D (Ergocalciferol) 1.25 MG (50000 UT) Caps capsule Commonly known as:  DRISDOL Take 50,000 Units by mouth every 7 (seven) days.         Diet and Activity recommendation: See Discharge Instructions above   Consults obtained -  ID General Surgery   Major procedures and Radiology Reports - PLEASE review detailed and final  reports for all details, in brief -      Dg Chest 2 View  Result Date: 02/05/2018 CLINICAL DATA:  Cough, fever EXAM: CHEST - 2 VIEW COMPARISON:  12/17/2017 FINDINGS: Cardiomegaly. No confluent airspace opacities, effusions or edema. No acute bony abnormality. IMPRESSION: Cardiomegaly.  No active disease. Electronically Signed   By: Rolm Baptise M.D.   On: 02/05/2018 12:00   US Renal  Result Date: 02/06/2018 CLINICAL DATA:  Acute renal failure EXAM: RENAL / URINARY TRACT ULTRASOUND COMPLETE COMPARISON:  12/17/2017 CT abdomen/pelvis. FINDINGS: Examination limited by patient body habitus. Right Kidney: Renal measurements: 9.6 x 4.4 x 5.6 cm = volume: 123 mL . Echogenicity within normal limits. No mass or hydronephrosis visualized. Incidentally noted diffuse hepatic steatosis in the visualized right liver lobe. Left Kidney: Renal measurements: 9.5 x 4.5 x 4.6 cm = volume: 102 mL. Echogenicity within normal limits. No mass or hydronephrosis visualized. Bladder: Appears normal for degree of bladder distention. IMPRESSION: Normal ultrasound of the kidneys and bladder. No hydronephrosis. Incidental diffuse hepatic steatosis. Electronically Signed   By: Ilona Sorrel M.D.   On: 02/06/2018 00:24    Micro Results    Recent Results (from the past 240 hour(s))  Blood Culture (routine x 2)     Status: None (Preliminary result)   Collection Time: 02/05/18 11:21 AM  Result Value Ref Range Status   Specimen Description BLOOD LEFT ANTECUBITAL  Final   Special Requests   Final    BOTTLES DRAWN AEROBIC AND ANAEROBIC Blood Culture results may not be optimal due to an inadequate volume of blood received in culture bottles Performed at [facility] [address] [place], [place] [address]    Culture NO GROWTH 4 DAYS  Final   Report Status PENDING  Incomplete  Blood Culture (routine x 2)     Status: None (Preliminary result)   Collection Time: 02/05/18 11:26 AM  Result Value Ref Range Status    Specimen Description BLOOD LEFT ANTECUBITAL  Final   Special Requests   Final    BOTTLES DRAWN AEROBIC AND ANAEROBIC Blood Culture adequate volume Performed at [facility], [place],  [place] [address]    Culture NO GROWTH 4 DAYS  Final   Report Status PENDING  Incomplete       Objective:   Blood pressure 126/71, pulse 100, temperature (!) 97.5 F (36.4 C), temperature source Oral, resp. rate 20, weight (!) 136.1 kg, SpO2 100 %.   Intake/Output Summary (Last 24 hours) at 02/09/2018 1431 Last data filed at 02/08/2018 1822 Gross per 24 hour  Intake 934.43 ml  Output 650 ml  Net 284.43 ml      Data Review   CBC w Diff:  Lab Results  Component Value Date   WBC 5.8 02/08/2018   HGB 8.3 (L) 02/08/2018   HCT 25.6 (L) 02/08/2018   HCT 25.3 (L) 12/21/2017   PLT 173 02/08/2018   LYMPHOPCT 8 02/05/2018   MONOPCT 3 02/05/2018   EOSPCT 14 02/05/2018   BASOPCT 0 02/05/2018    CMP:  Lab Results  Component Value Date   NA 140 02/08/2018   K 4.8 02/08/2018   CL 118 (H) 02/08/2018   CO2 15 (L) 02/08/2018   BUN 38 (H) 02/08/2018   CREATININE 1.40 (H) 02/08/2018   PROT 5.4 (L) 02/05/2018   ALBUMIN 2.1 (L) 02/06/2018   BILITOT 0.9 02/05/2018   ALKPHOS 276 (H) 02/05/2018   AST 43 (H) 02/05/2018   ALT 52 (H) 02/05/2018  .  Time spent: No charge  Phillips Climes M.D on 02/09/2018 at 2:31 PM  [facility]   Office  [phone]

## 2018-02-10 LAB — CULTURE, BLOOD (ROUTINE X 2)
CULTURE: NO GROWTH
Culture: NO GROWTH
Special Requests: ADEQUATE

## 2018-02-13 MED ORDER — FUROSEMIDE 20 MG PO TABS
20.00 | ORAL_TABLET | ORAL | Status: DC
Start: 2018-02-14 — End: 2018-02-13

## 2018-02-13 MED ORDER — ALBUTEROL SULFATE HFA 108 (90 BASE) MCG/ACT IN AERS
2.00 | INHALATION_SPRAY | RESPIRATORY_TRACT | Status: DC
Start: ? — End: 2018-02-13

## 2018-02-13 MED ORDER — INSULIN LISPRO 100 UNIT/ML ~~LOC~~ SOLN
2.00 | SUBCUTANEOUS | Status: DC
Start: 2018-02-13 — End: 2018-02-13

## 2018-02-13 MED ORDER — DEXTROSE 10 % IV SOLN
125.00 | INTRAVENOUS | Status: DC
Start: ? — End: 2018-02-13

## 2018-02-13 MED ORDER — TRAMADOL HCL 50 MG PO TABS
50.00 | ORAL_TABLET | ORAL | Status: DC
Start: ? — End: 2018-02-13

## 2018-02-13 MED ORDER — RISPERIDONE 0.25 MG PO TABS
0.50 | ORAL_TABLET | ORAL | Status: DC
Start: 2018-02-13 — End: 2018-02-13

## 2018-02-13 MED ORDER — DOXEPIN HCL 25 MG PO CAPS
25.00 | ORAL_CAPSULE | ORAL | Status: DC
Start: 2018-02-13 — End: 2018-02-13

## 2018-02-13 MED ORDER — TRIAMCINOLONE ACETONIDE 0.1 % EX OINT
TOPICAL_OINTMENT | CUTANEOUS | Status: DC
Start: 2018-02-13 — End: 2018-02-13

## 2018-02-13 MED ORDER — ACETAMINOPHEN 325 MG PO TABS
650.00 | ORAL_TABLET | ORAL | Status: DC
Start: ? — End: 2018-02-13

## 2018-02-13 MED ORDER — NYSTATIN 100000 UNIT/GM EX POWD
CUTANEOUS | Status: DC
Start: 2018-02-13 — End: 2018-02-13

## 2018-02-13 MED ORDER — ENOXAPARIN SODIUM 40 MG/0.4ML ~~LOC~~ SOLN
40.00 | SUBCUTANEOUS | Status: DC
Start: 2018-02-13 — End: 2018-02-13

## 2018-02-13 MED ORDER — GENERIC EXTERNAL MEDICATION
75.00 | Status: DC
Start: 2018-02-13 — End: 2018-02-13

## 2018-02-13 MED ORDER — COLLAGENASE 250 UNIT/GM EX OINT
TOPICAL_OINTMENT | CUTANEOUS | Status: DC
Start: 2018-02-14 — End: 2018-02-13

## 2018-02-13 MED ORDER — AMLODIPINE BESYLATE 5 MG PO TABS
10.00 | ORAL_TABLET | ORAL | Status: DC
Start: 2018-02-14 — End: 2018-02-13

## 2018-02-13 MED ORDER — IPRATROPIUM-ALBUTEROL 0.5-2.5 (3) MG/3ML IN SOLN
3.00 | RESPIRATORY_TRACT | Status: DC
Start: ? — End: 2018-02-13

## 2018-02-13 MED ORDER — FOLIC ACID 1 MG PO TABS
1.00 | ORAL_TABLET | ORAL | Status: DC
Start: 2018-02-14 — End: 2018-02-13

## 2018-02-13 MED ORDER — FAMOTIDINE 20 MG PO TABS
20.00 | ORAL_TABLET | ORAL | Status: DC
Start: 2018-02-13 — End: 2018-02-13

## 2018-02-13 MED ORDER — GENERIC EXTERNAL MEDICATION
30.00 | Status: DC
Start: 2018-02-13 — End: 2018-02-13

## 2018-02-13 MED ORDER — GABAPENTIN 300 MG PO CAPS
300.00 | ORAL_CAPSULE | ORAL | Status: DC
Start: 2018-02-13 — End: 2018-02-13

## 2018-02-13 MED ORDER — GENERIC EXTERNAL MEDICATION
1.00 | Status: DC
Start: 2018-02-14 — End: 2018-02-13

## 2018-02-13 MED ORDER — HYDROXYZINE HCL 25 MG PO TABS
12.50 | ORAL_TABLET | ORAL | Status: DC
Start: ? — End: 2018-02-13

## 2018-02-13 MED ORDER — GUAIFENESIN 100 MG/5ML PO SYRP
200.00 | ORAL_SOLUTION | ORAL | Status: DC
Start: ? — End: 2018-02-13

## 2018-02-13 MED ORDER — PRAVASTATIN SODIUM 40 MG PO TABS
40.00 | ORAL_TABLET | ORAL | Status: DC
Start: 2018-02-13 — End: 2018-02-13

## 2018-02-13 MED ORDER — HYDROCORTISONE 2.5 % EX CREA
TOPICAL_CREAM | CUTANEOUS | Status: DC
Start: 2018-02-13 — End: 2018-02-13

## 2018-02-13 MED ORDER — INSULIN GLARGINE 100 UNIT/ML SOLOSTAR PEN
25.00 | PEN_INJECTOR | SUBCUTANEOUS | Status: DC
Start: 2018-02-13 — End: 2018-02-13

## 2018-02-15 DIAGNOSIS — L89152 Pressure ulcer of sacral region, stage 2: Secondary | ICD-10-CM | POA: Insufficient documentation

## 2018-02-28 ENCOUNTER — Encounter (HOSPITAL_COMMUNITY): Payer: Self-pay | Admitting: Emergency Medicine

## 2018-02-28 ENCOUNTER — Emergency Department (HOSPITAL_COMMUNITY): Payer: Medicare Other

## 2018-02-28 ENCOUNTER — Other Ambulatory Visit: Payer: Self-pay

## 2018-02-28 ENCOUNTER — Inpatient Hospital Stay (HOSPITAL_COMMUNITY)
Admission: EM | Admit: 2018-02-28 | Discharge: 2018-03-05 | DRG: 683 | Disposition: A | Discharge status: Skilled Nursing Facility | Payer: Medicare Other | Attending: Internal Medicine | Admitting: Internal Medicine

## 2018-02-28 DIAGNOSIS — I119 Hypertensive heart disease without heart failure: Secondary | ICD-10-CM | POA: Diagnosis present

## 2018-02-28 DIAGNOSIS — I959 Hypotension, unspecified: Secondary | ICD-10-CM | POA: Diagnosis present

## 2018-02-28 DIAGNOSIS — E875 Hyperkalemia: Secondary | ICD-10-CM | POA: Diagnosis present

## 2018-02-28 DIAGNOSIS — R062 Wheezing: Secondary | ICD-10-CM | POA: Diagnosis present

## 2018-02-28 DIAGNOSIS — E1165 Type 2 diabetes mellitus with hyperglycemia: Secondary | ICD-10-CM | POA: Diagnosis present

## 2018-02-28 DIAGNOSIS — Z794 Long term (current) use of insulin: Secondary | ICD-10-CM

## 2018-02-28 DIAGNOSIS — L89152 Pressure ulcer of sacral region, stage 2: Secondary | ICD-10-CM | POA: Diagnosis present

## 2018-02-28 DIAGNOSIS — E639 Nutritional deficiency, unspecified: Secondary | ICD-10-CM | POA: Diagnosis present

## 2018-02-28 DIAGNOSIS — N179 Acute kidney failure, unspecified: Secondary | ICD-10-CM | POA: Diagnosis present

## 2018-02-28 DIAGNOSIS — F200 Paranoid schizophrenia: Secondary | ICD-10-CM | POA: Diagnosis present

## 2018-02-28 DIAGNOSIS — E1169 Type 2 diabetes mellitus with other specified complication: Secondary | ICD-10-CM | POA: Diagnosis present

## 2018-02-28 DIAGNOSIS — R739 Hyperglycemia, unspecified: Secondary | ICD-10-CM

## 2018-02-28 DIAGNOSIS — E11649 Type 2 diabetes mellitus with hypoglycemia without coma: Secondary | ICD-10-CM | POA: Diagnosis not present

## 2018-02-28 DIAGNOSIS — E1129 Type 2 diabetes mellitus with other diabetic kidney complication: Secondary | ICD-10-CM

## 2018-02-28 DIAGNOSIS — G8929 Other chronic pain: Secondary | ICD-10-CM | POA: Diagnosis present

## 2018-02-28 DIAGNOSIS — D638 Anemia in other chronic diseases classified elsewhere: Secondary | ICD-10-CM | POA: Diagnosis present

## 2018-02-28 DIAGNOSIS — Z87891 Personal history of nicotine dependence: Secondary | ICD-10-CM

## 2018-02-28 DIAGNOSIS — R112 Nausea with vomiting, unspecified: Secondary | ICD-10-CM

## 2018-02-28 DIAGNOSIS — H538 Other visual disturbances: Secondary | ICD-10-CM | POA: Diagnosis present

## 2018-02-28 DIAGNOSIS — D539 Nutritional anemia, unspecified: Secondary | ICD-10-CM | POA: Diagnosis present

## 2018-02-28 DIAGNOSIS — R0602 Shortness of breath: Secondary | ICD-10-CM

## 2018-02-28 DIAGNOSIS — Z91010 Allergy to peanuts: Secondary | ICD-10-CM

## 2018-02-28 DIAGNOSIS — Z79891 Long term (current) use of opiate analgesic: Secondary | ICD-10-CM

## 2018-02-28 DIAGNOSIS — M109 Gout, unspecified: Secondary | ICD-10-CM | POA: Diagnosis present

## 2018-02-28 DIAGNOSIS — Z79899 Other long term (current) drug therapy: Secondary | ICD-10-CM

## 2018-02-28 DIAGNOSIS — E785 Hyperlipidemia, unspecified: Secondary | ICD-10-CM | POA: Diagnosis present

## 2018-02-28 DIAGNOSIS — T68XXXA Hypothermia, initial encounter: Secondary | ICD-10-CM

## 2018-02-28 DIAGNOSIS — L309 Dermatitis, unspecified: Secondary | ICD-10-CM | POA: Diagnosis present

## 2018-02-28 DIAGNOSIS — E860 Dehydration: Secondary | ICD-10-CM | POA: Diagnosis present

## 2018-02-28 DIAGNOSIS — D519 Vitamin B12 deficiency anemia, unspecified: Secondary | ICD-10-CM | POA: Diagnosis present

## 2018-02-28 DIAGNOSIS — Z6841 Body Mass Index (BMI) 40.0 and over, adult: Secondary | ICD-10-CM

## 2018-02-28 DIAGNOSIS — I10 Essential (primary) hypertension: Secondary | ICD-10-CM | POA: Diagnosis present

## 2018-02-28 DIAGNOSIS — F329 Major depressive disorder, single episode, unspecified: Secondary | ICD-10-CM | POA: Diagnosis present

## 2018-02-28 DIAGNOSIS — S31000A Unspecified open wound of lower back and pelvis without penetration into retroperitoneum, initial encounter: Secondary | ICD-10-CM

## 2018-02-28 LAB — CBC WITH DIFFERENTIAL/PLATELET
Abs Immature Granulocytes: 0.02 10*3/uL (ref 0.00–0.07)
Basophils Absolute: 0 10*3/uL (ref 0.0–0.1)
Basophils Relative: 0 %
Eosinophils Absolute: 0 10*3/uL (ref 0.0–0.5)
Eosinophils Relative: 0 %
HCT: 27 % — ABNORMAL LOW (ref 36.0–46.0)
Hemoglobin: 8.2 g/dL — ABNORMAL LOW (ref 12.0–15.0)
Immature Granulocytes: 0 %
Lymphocytes Relative: 9 %
Lymphs Abs: 0.5 10*3/uL — ABNORMAL LOW (ref 0.7–4.0)
MCH: 30.8 pg (ref 26.0–34.0)
MCHC: 30.4 g/dL (ref 30.0–36.0)
MCV: 101.5 fL — ABNORMAL HIGH (ref 80.0–100.0)
MONO ABS: 0.2 10*3/uL (ref 0.1–1.0)
Monocytes Relative: 4 %
NEUTROS ABS: 4.7 10*3/uL (ref 1.7–7.7)
Neutrophils Relative %: 87 %
Platelets: 170 10*3/uL (ref 150–400)
RBC: 2.66 MIL/uL — ABNORMAL LOW (ref 3.87–5.11)
RDW: 16.4 % — ABNORMAL HIGH (ref 11.5–15.5)
WBC: 5.4 10*3/uL (ref 4.0–10.5)
nRBC: 0 % (ref 0.0–0.2)

## 2018-02-28 LAB — COMPREHENSIVE METABOLIC PANEL
ALT: 75 U/L — ABNORMAL HIGH (ref 0–44)
AST: 26 U/L (ref 15–41)
Albumin: 2.5 g/dL — ABNORMAL LOW (ref 3.5–5.0)
Alkaline Phosphatase: 451 U/L — ABNORMAL HIGH (ref 38–126)
Anion gap: 14 (ref 5–15)
BILIRUBIN TOTAL: 0.8 mg/dL (ref 0.3–1.2)
BUN: 72 mg/dL — ABNORMAL HIGH (ref 8–23)
CO2: 17 mmol/L — ABNORMAL LOW (ref 22–32)
Calcium: 8.9 mg/dL (ref 8.9–10.3)
Chloride: 100 mmol/L (ref 98–111)
Creatinine, Ser: 4.41 mg/dL — ABNORMAL HIGH (ref 0.44–1.00)
GFR calc Af Amer: 11 mL/min — ABNORMAL LOW (ref >60)
GFR calc non Af Amer: 10 mL/min — ABNORMAL LOW (ref >60)
Glucose, Bld: 453 mg/dL — ABNORMAL HIGH (ref 70–99)
POTASSIUM: 4.8 mmol/L (ref 3.5–5.1)
Sodium: 131 mmol/L — ABNORMAL LOW (ref 135–145)
Total Protein: 5.6 g/dL — ABNORMAL LOW (ref 6.5–8.1)

## 2018-02-28 LAB — URINALYSIS, ROUTINE W REFLEX MICROSCOPIC
Bilirubin Urine: NEGATIVE
Glucose, UA: 150 mg/dL — AB
Hgb urine dipstick: NEGATIVE
Ketones, ur: NEGATIVE mg/dL
Leukocytes, UA: NEGATIVE
Nitrite: NEGATIVE
Protein, ur: NEGATIVE mg/dL
Specific Gravity, Urine: 1.018 (ref 1.005–1.030)
pH: 5 (ref 5.0–8.0)

## 2018-02-28 LAB — CBG MONITORING, ED: Glucose-Capillary: 373 mg/dL — ABNORMAL HIGH (ref 70–99)

## 2018-02-28 LAB — LACTIC ACID, PLASMA
LACTIC ACID, VENOUS: 2.1 mmol/L — AB (ref 0.5–1.9)
LACTIC ACID, VENOUS: 1.8 mmol/L (ref 0.5–1.9)

## 2018-02-28 LAB — GLUCOSE, CAPILLARY: Glucose-Capillary: 481 mg/dL — ABNORMAL HIGH (ref 70–99)

## 2018-02-28 LAB — TSH: TSH: 3.106 u[IU]/mL (ref 0.350–4.500)

## 2018-02-28 MED ORDER — COLLAGENASE 250 UNIT/GM EX OINT
1.0000 "application " | TOPICAL_OINTMENT | Freq: Every day | CUTANEOUS | Status: DC
  Administered 2018-03-01 – 2018-03-05 (×5): 1 via TOPICAL
  Filled 2018-02-28: qty 30

## 2018-02-28 MED ORDER — B COMPLEX-C PO TABS
1.0000 | ORAL_TABLET | Freq: Every day | ORAL | Status: DC
  Administered 2018-03-01 – 2018-03-05 (×5): 1 via ORAL
  Filled 2018-02-28 (×5): qty 1

## 2018-02-28 MED ORDER — HYDROCERIN EX CREA
1.0000 "application " | TOPICAL_CREAM | Freq: Two times a day (BID) | CUTANEOUS | Status: DC
  Administered 2018-02-28 – 2018-03-05 (×10): 1 via TOPICAL
  Filled 2018-02-28 (×2): qty 113

## 2018-02-28 MED ORDER — SODIUM CHLORIDE 0.9 % IV BOLUS
500.0000 mL | Freq: Once | INTRAVENOUS | Status: AC
  Administered 2018-02-28: 500 mL via INTRAVENOUS

## 2018-02-28 MED ORDER — VITAMIN D (ERGOCALCIFEROL) 1.25 MG (50000 UNIT) PO CAPS
50000.0000 [IU] | ORAL_CAPSULE | ORAL | Status: DC
  Administered 2018-03-04: 50000 [IU] via ORAL
  Filled 2018-02-28: qty 1

## 2018-02-28 MED ORDER — SODIUM CHLORIDE 0.9 % IV SOLN
INTRAVENOUS | Status: AC
  Administered 2018-02-28 – 2018-03-01 (×2): via INTRAVENOUS

## 2018-02-28 MED ORDER — RISPERIDONE 0.5 MG PO TABS
0.5000 mg | ORAL_TABLET | Freq: Two times a day (BID) | ORAL | Status: DC
  Administered 2018-02-28 – 2018-03-05 (×10): 0.5 mg via ORAL
  Filled 2018-02-28 (×10): qty 1

## 2018-02-28 MED ORDER — ZINC SULFATE 220 (50 ZN) MG PO CAPS
220.0000 mg | ORAL_CAPSULE | Freq: Every day | ORAL | Status: DC
  Administered 2018-02-28 – 2018-03-05 (×6): 220 mg via ORAL
  Filled 2018-02-28 (×6): qty 1

## 2018-02-28 MED ORDER — DOXEPIN HCL 25 MG PO CAPS
25.0000 mg | ORAL_CAPSULE | Freq: Two times a day (BID) | ORAL | Status: DC
  Administered 2018-02-28 – 2018-03-05 (×10): 25 mg via ORAL
  Filled 2018-02-28 (×10): qty 1

## 2018-02-28 MED ORDER — ACETAMINOPHEN 650 MG RE SUPP: 650.0000 mg | Freq: Four times a day (QID) | RECTAL | Status: DC | PRN

## 2018-02-28 MED ORDER — SODIUM CHLORIDE 0.9 % IV BOLUS
1000.0000 mL | Freq: Once | INTRAVENOUS | Status: AC
  Administered 2018-02-28: 1000 mL via INTRAVENOUS

## 2018-02-28 MED ORDER — INSULIN ASPART 100 UNIT/ML ~~LOC~~ SOLN: 0.0000 [IU] | Freq: Every day | SUBCUTANEOUS | Status: DC

## 2018-02-28 MED ORDER — PRAVASTATIN SODIUM 40 MG PO TABS
40.0000 mg | ORAL_TABLET | Freq: Every day | ORAL | Status: DC
  Administered 2018-02-28 – 2018-03-05 (×6): 40 mg via ORAL
  Filled 2018-02-28 (×6): qty 1

## 2018-02-28 MED ORDER — VITAMIN C 500 MG PO TABS
500.0000 mg | ORAL_TABLET | Freq: Two times a day (BID) | ORAL | Status: DC
  Administered 2018-02-28 – 2018-03-05 (×10): 500 mg via ORAL
  Filled 2018-02-28 (×10): qty 1

## 2018-02-28 MED ORDER — IPRATROPIUM-ALBUTEROL 0.5-2.5 (3) MG/3ML IN SOLN
3.0000 mL | Freq: Four times a day (QID) | RESPIRATORY_TRACT | Status: DC | PRN
  Administered 2018-03-02 – 2018-03-05 (×3): 3 mL via RESPIRATORY_TRACT
  Filled 2018-02-28 (×4): qty 3

## 2018-02-28 MED ORDER — INSULIN ASPART 100 UNIT/ML ~~LOC~~ SOLN
0.0000 [IU] | Freq: Three times a day (TID) | SUBCUTANEOUS | Status: DC
  Administered 2018-03-01: 11 [IU] via SUBCUTANEOUS
  Administered 2018-03-01: 4 [IU] via SUBCUTANEOUS
  Administered 2018-03-01: 20 [IU] via SUBCUTANEOUS
  Administered 2018-03-02: 3 [IU] via SUBCUTANEOUS
  Administered 2018-03-03: 4 [IU] via SUBCUTANEOUS
  Administered 2018-03-03: 3 [IU] via SUBCUTANEOUS
  Administered 2018-03-05: 11 [IU] via SUBCUTANEOUS

## 2018-02-28 MED ORDER — INSULIN ASPART 100 UNIT/ML ~~LOC~~ SOLN
10.0000 [IU] | Freq: Once | SUBCUTANEOUS | Status: AC
  Administered 2018-02-28: 10 [IU] via SUBCUTANEOUS

## 2018-02-28 MED ORDER — HYDROXYZINE HCL 25 MG PO TABS
12.5000 mg | ORAL_TABLET | Freq: Three times a day (TID) | ORAL | Status: DC
  Administered 2018-02-28 – 2018-03-05 (×14): 12.5 mg via ORAL
  Filled 2018-02-28 (×14): qty 1

## 2018-02-28 MED ORDER — ACETAMINOPHEN 325 MG PO TABS
650.0000 mg | ORAL_TABLET | Freq: Four times a day (QID) | ORAL | Status: DC | PRN
  Administered 2018-02-28 – 2018-03-05 (×6): 650 mg via ORAL
  Filled 2018-02-28 (×6): qty 2

## 2018-02-28 MED ORDER — HEPARIN SODIUM (PORCINE) 5000 UNIT/ML IJ SOLN
5000.0000 [IU] | Freq: Three times a day (TID) | INTRAMUSCULAR | Status: DC
  Administered 2018-02-28 – 2018-03-05 (×14): 5000 [IU] via SUBCUTANEOUS
  Filled 2018-02-28 (×14): qty 1

## 2018-02-28 MED ORDER — CYANOCOBALAMIN 1000 MCG/ML IJ SOLN
1000.0000 ug | INTRAMUSCULAR | Status: DC
  Administered 2018-02-28: 1000 ug via INTRAMUSCULAR
  Filled 2018-02-28: qty 1

## 2018-02-28 MED ORDER — VITAMIN B-6 25 MG PO TABS
25.0000 mg | ORAL_TABLET | Freq: Every day | ORAL | Status: DC
  Administered 2018-03-01 – 2018-03-05 (×5): 25 mg via ORAL
  Filled 2018-02-28 (×5): qty 1

## 2018-02-28 MED ORDER — ALBUTEROL SULFATE HFA 108 (90 BASE) MCG/ACT IN AERS: 2.0000 | INHALATION_SPRAY | RESPIRATORY_TRACT | Status: DC | PRN

## 2018-02-28 MED ORDER — INSULIN GLARGINE 100 UNIT/ML ~~LOC~~ SOLN
30.0000 [IU] | Freq: Every day | SUBCUTANEOUS | Status: DC
  Administered 2018-02-28: 30 [IU] via SUBCUTANEOUS
  Filled 2018-02-28: qty 0.3

## 2018-02-28 NOTE — ED Triage Notes (Signed)
Pt from [facility] with c/o abnormal labs.  Staff at facility reports pt needs kidney function eval and hyperglycemia.

## 2018-02-28 NOTE — H&P (Signed)
History and Physical    Heather Petty WHQ:[id number] DOB: 11/25/1950 DOA: 02/28/2018  PCP: [name], MD  Patient coming from: [name]  I have personally briefly reviewed patient's old medical records in [place]  Chief Complaint: AKI  HPI: Heather Petty is a 68 y.o. female with medical history significant for IDT2DM, hypertension, hyperlipidemia, asthma, anemia chronic disease and B12 deficiency, spongiotic dermatitis, morbid obesity, chronic pain, chronic sacral pressure ulcer, and depression/schizophrenia who presents the ED from her SNF due to labs showing acute kidney injury and hyperglycemia.  Patient had recent admission at [facility] from 02/05/2018-02/08/2018 for AKI and dermatitis subsequently transferred to [facility] for dermatology evaluation with eventual discharge on 02/21/2018.  Her renal function improved with less creatinine 0.79 on 02/21/2018.  Her HCTZ was discontinued and her Lasix was decreased to 20 mg p.o. daily.  Her home benazepril 20 mg was continued.  Her rash was found to be spongiotic dermatitis and felt related to nutritional deficiency by dermatology.  Patient states that she has not felt significantly different compared to her baseline other than having chest congestion.  She denies any chest pain, dyspnea, abdominal pain, diarrhea, or dysuria.  She reports continued urine output and frequent thirst.  She has been on a prednisone taper.  ED Course:  Initial vitals showed BP 99/51, pulse 81, RR 19, temp 93.4 Fahrenheit rectally, SPO2 98% on room air.  Initial labs are notable for WBC 5.4, hemoglobin 8.2 (at recent baseline), MCV one 1.5, platelets 170, potassium 4.8, serum glucose 453, AST 26, ALT 75, alk phos 451, lactic acid 2.1, TSH 3.1, urinalysis negative for UTI.  Blood cultures were drawn and pending.  BUN 72, creatinine 4.41 (compared to 24/0.79 on 02/21/2018).  Portable chest x-ray was without focal  consolidation or effusion.  CT head without contrast was without evidence of acute intracranial abnormality.  Patient was given 1.5 L normal saline and the hospital service was consulted to admit for further management.  Review of Systems: As per HPI otherwise 10 point review of systems negative.    Past Medical History:  Diagnosis Date  . AKI (acute kidney injury) ([place]) 12/17/2017  . Arthritis   . Asthma   . Depression   . Diabetes mellitus   . Gout   . High cholesterol   . Hypertension   . Morbid obesity ([place])   . Pneumonia 12/17/2017  . Schizophrenia [facility])     Past Surgical History:  Procedure Laterality Date  . CYST EXCISION    . DENTAL SURGERY    . TONSILLECTOMY       reports that she has quit smoking. She has never used smokeless tobacco. She reports that she does not drink alcohol or use drugs.  Allergies  Allergen Reactions  . Peanut-Containing Drug Products Other (See Comments)    Questionable?? May have outgrown it??    Family History  Family history unknown: Yes     Prior to Admission medications   Medication Sig Start Date End Date Taking? Authorizing Provider  acetaminophen (TYLENOL) 325 MG tablet Take 650 mg by mouth 4 (four) times daily as needed (for pain and not to exceed 3,000 mg in a 24-hr period).    Yes [provider]  albuterol (VENTOLIN HFA) 108 (90 Base) MCG/ACT inhaler Inhale 2 puffs into the lungs every 4 (four) hours as needed for wheezing or shortness of breath.   Yes [provider]  allopurinol (ZYLOPRIM) 300 MG tablet Take  300 mg by mouth daily.   Yes [provider]  amLODipine (NORVASC) 10 MG tablet Take 10 mg by mouth daily.   Yes [provider]  b complex vitamins tablet Take 1 tablet by mouth daily.   Yes [provider]  benazepril (LOTENSIN) 20 MG tablet Take 20 mg by mouth daily.   Yes [provider]  bisacodyl (DULCOLAX) 5 MG EC tablet Take 1 tablet (5 mg total) by mouth  daily as needed for moderate constipation. Patient taking differently: Take 5 mg by mouth at bedtime.  12/24/17  Yes Lady Deutscher, MD  cetirizine (ZYRTEC) 10 MG tablet Take 10 mg by mouth daily.    Yes [provider]  collagenase (SANTYL) ointment Apply topically daily. Patient taking differently: Apply 1 application topically See admin instructions. Apply to wound bed after cleansing, then cover with gauze moistened with normal saline- apply bordered silicone dressing daily 02/09/18  Yes Elgergawy, Silver Huguenin, MD  doxepin (SINEQUAN) 25 MG capsule Take 25 mg by mouth 2 (two) times daily.    Yes [provider]  famotidine (PEPCID) 20 MG tablet Take 20 mg by mouth at bedtime.    Yes [provider]  folic acid (FOLVITE) 1 MG tablet Take 1 mg by mouth daily.   Yes [provider]  furosemide (LASIX) 20 MG tablet Take 20 mg by mouth daily.   Yes [provider]  gabapentin (NEURONTIN) 300 MG capsule Take 300 mg by mouth 3 (three) times daily.   Yes [provider]  glipiZIDE (GLUCOTROL XL) 5 MG 24 hr tablet Take 5 mg by mouth 2 (two) times daily.   Yes [provider]  guaiFENesin (ROBITUSSIN) 100 MG/5ML liquid Take 100 mg by mouth every 4 (four) hours as needed for cough or congestion. 02/21/18 03/02/18 Yes [provider]  hydrocortisone 2.5 % cream Apply 1 application topically See admin instructions. Apply to rash on face 2 times a day   Yes [provider]  hydrOXYzine (ATARAX/VISTARIL) 25 MG tablet Take 25 mg by mouth 3 (three) times daily.   Yes [provider]  insulin glargine (LANTUS) 100 UNIT/ML injection Inject 0.2 mLs (20 Units total) into the skin 2 (two) times daily. Patient taking differently: Inject 30 Units into the skin at bedtime.  12/23/17  Yes Purohit, Konrad Dolores, MD  ipratropium-albuterol (DUONEB) 0.5-2.5 (3) MG/3ML SOLN Take 3 mLs by nebulization 4 (four) times daily as needed (for shortness  of breath or wheezing).   Yes [provider]  Multiple Vitamins-Minerals (THERA-M MULTIPLE VITAMINS PO) Take 1 tablet by mouth daily.   Yes [provider]  nystatin (MYCOSTATIN/NYSTOP) powder Apply topically See admin instructions. Apply between folds, beneath breasts, bilateral axillae, and under pannus   Yes [provider]  polyethylene glycol (MIRALAX / GLYCOLAX) packet Take 17 g by mouth 2 (two) times daily. Patient taking differently: Take 17 g by mouth daily as needed for mild constipation.  12/24/17  Yes Lady Deutscher, MD  pravastatin (PRAVACHOL) 40 MG tablet Take 40 mg by mouth daily.   Yes [provider]  risperiDONE (RISPERDAL) 0.5 MG tablet Take 1 tablet (0.5 mg total) by mouth 2 (two) times daily. 12/23/17 02/28/18 Yes Purohit, Konrad Dolores, MD  Skin Protectants, Misc. (EUCERIN) cream Apply 1 application topically See admin instructions. Apply to areas of "generalized body for dry/itchy skin" 2 times a day   Yes [provider]  traMADol (ULTRAM) 50 MG tablet Take 50  mg by mouth 4 (four) times daily as needed (for pain).   Yes [provider]  triamcinolone (KENALOG) 0.147 MG/GM topical spray Apply 1 spray topically See admin instructions. Apply to head in the direction of hair growth   Yes [provider]  vitamin B-6 (PYRIDOXINE) 25 MG tablet Take 25 mg by mouth daily.   Yes [provider]  vitamin C (ASCORBIC ACID) 500 MG tablet Take 500 mg by mouth 2 (two) times daily.   Yes [provider]  Vitamin D, Ergocalciferol, (DRISDOL) 1.25 MG (50000 UT) CAPS capsule Take 50,000 Units by mouth every Monday.    Yes [provider]  Zinc Sulfate (ZINC-220 PO) Take 220 mg by mouth daily.   Yes [provider]  Azelastine-Fluticasone (DYMISTA) 137-50 MCG/ACT SUSP Place 1 spray into the nose as needed (allergies).    [provider]  cyanocobalamin (,VITAMIN B-12,) 1000 MCG/ML injection  Inject 1 mL (1,000 mcg total) into the muscle once a week. Patient not taking: Reported on 02/28/2018 02/09/18   Elgergawy, Silver Huguenin, MD  gabapentin (NEURONTIN) 600 MG tablet Take 0.5 tablets (300 mg total) by mouth 3 (three) times daily. Patient not taking: Reported on 02/28/2018 12/23/17 02/28/18  Purohit, Konrad Dolores, MD  potassium chloride (K-DUR) 10 MEQ tablet Take 10 mEq by mouth daily. 12/03/17   [provider]  silver sulfADIAZINE (SILVADENE) 1 % cream Apply topically 2 (two) times daily. Patient not taking: Reported on 02/28/2018 02/09/18   Elgergawy, Silver Huguenin, MD    Physical Exam: Vitals:   02/28/18 1915 02/28/18 1915 02/28/18 1930 02/28/18 2118  BP: (!) 117/50 (!) 117/50 (!) 105/52 (!) 119/48  Pulse: 87 78 92 (!) 101  Resp: 19 20 16    Temp:  97.6 F (36.4 C)  98.3 F (36.8 C)  TempSrc:  Oral    SpO2: 95% 96% 92% 100%  Weight:      Height:        Constitutional: Morbidly obese woman lying supine in bed, NAD, calm, comfortable, warming blanket in place Eyes: PERRL, lids and conjunctivae normal ENMT: Mucous membranes are dry. Posterior pharynx clear of any exudate or lesions. Normal dentition.  Neck: normal, supple, no masses. Respiratory: clear to auscultation bilaterally, no wheezing, no crackles. Normal respiratory effort. No accessory muscle use.  Cardiovascular: Regular rate and rhythm, no murmurs / rubs / gallops.  Trace lower extremity edema. Abdomen: no tenderness, no masses palpated. No hepatosplenomegaly. Musculoskeletal: no clubbing / cyanosis. No joint deformity upper and lower extremities. No contractures. Normal muscle tone.  Skin: Diffuse flaky rash with patches of hypo-and hyperpigmented areas.  Chronic sacral wound with wound dressing in place. Neurologic: CN 2-12 grossly intact. Sensation intact, moving all extremities.  Psychiatric: Normal judgment and insight. Alert and oriented x 3. Normal mood.   Labs on Admission: I have personally reviewed following  labs and imaging studies  CBC: Recent Labs  Lab 02/28/18 1612  WBC 5.4  NEUTROABS 4.7  HGB 8.2*  HCT 27.0*  MCV 101.5*  PLT 947   Basic Metabolic Panel: Recent Labs  Lab 02/28/18 1612  NA 131*  K 4.8  CL 100  CO2 17*  GLUCOSE 453*  BUN 72*  CREATININE 4.41*  CALCIUM 8.9   GFR: Estimated Creatinine Clearance: 15.8 mL/min (A) (by C-G formula based on SCr of 4.41 mg/dL (H)). Liver Function Tests: Recent Labs  Lab 02/28/18 1612  AST 26  ALT 75*  ALKPHOS 451*  BILITOT 0.8  PROT 5.6*  ALBUMIN 2.5*   No results for input(s): LIPASE, AMYLASE in the last 168 hours. No results for input(s): AMMONIA in the last 168 hours. Coagulation Profile: No results for input(s): INR, PROTIME in the last 168 hours. Cardiac Enzymes: No results for input(s): CKTOTAL, CKMB, CKMBINDEX, TROPONINI in the last 168 hours. BNP (last 3 results) No results for input(s): PROBNP in the last 8760 hours. HbA1C: No results for input(s): HGBA1C in the last 72 hours. CBG: Recent Labs  Lab 02/28/18 1648 02/28/18 2150  GLUCAP 373* 481*   Lipid Profile: No results for input(s): CHOL, HDL, LDLCALC, TRIG, CHOLHDL, LDLDIRECT in the last 72 hours. Thyroid Function Tests: Recent Labs    02/28/18 1730  TSH 3.106   Anemia Panel: No results for input(s): VITAMINB12, FOLATE, FERRITIN, TIBC, IRON, RETICCTPCT in the last 72 hours. Urine analysis:    Component Value Date/Time   COLORURINE AMBER (A) 02/28/2018 1755   APPEARANCEUR HAZY (A) 02/28/2018 1755   LABSPEC 1.018 02/28/2018 1755   PHURINE 5.0 02/28/2018 1755   GLUCOSEU 150 (A) 02/28/2018 1755   HGBUR NEGATIVE 02/28/2018 1755   BILIRUBINUR NEGATIVE 02/28/2018 1755   KETONESUR NEGATIVE 02/28/2018 1755   PROTEINUR NEGATIVE 02/28/2018 1755   UROBILINOGEN 0.2 12/27/2010 1858   NITRITE NEGATIVE 02/28/2018 1755   LEUKOCYTESUR NEGATIVE 02/28/2018 1755    Radiological Exams on Admission: Ct Head Wo Contrast  Result Date: 02/28/2018 CLINICAL  DATA:  Blurry vision. EXAM: CT HEAD WITHOUT CONTRAST TECHNIQUE: Contiguous axial images were obtained from the base of the skull through the vertex without intravenous contrast. COMPARISON:  None. FINDINGS: Brain: There is no evidence of acute infarct, intracranial hemorrhage, mass, midline shift, or extra-axial fluid collection. Mild cerebral atrophy is within normal limits for age. Cerebral white matter hypodensities are nonspecific but compatible with mild chronic small vessel ischemic disease. Vascular: Calcified atherosclerosis at the skull base. No hyperdense vessel. Skull: No fracture or focal osseous lesion. Sinuses/Orbits: Paranasal sinuses and mastoid air cells are clear. Unremarkable orbits. Other: None. IMPRESSION: 1. No evidence of acute intracranial abnormality. 2. Mild chronic small vessel ischemic disease. Electronically Signed   By: Logan Bores M.D.   On: 02/28/2018 17:42   Dg Chest Portable 1 View  Result Date: 02/28/2018 CLINICAL DATA:  Hyperglycemia EXAM: PORTABLE CHEST 1 VIEW COMPARISON:  02/05/2018 FINDINGS: Mild cardiomegaly. No focal consolidation or effusion. Aortic atherosclerosis. No pneumothorax. IMPRESSION: No active disease.  Mild cardiomegaly Electronically Signed   By: Donavan Foil M.D.   On: 02/28/2018 16:24    EKG: Independently reviewed. Sinus rhythm, rate 84 bpm, low voltage.  Assessment/Plan Principal Problem:   Acute kidney injury ([place]) Active Problems:   DM2 (diabetes mellitus, type 2) ([place])   HTN (hypertension)   Paranoid schizophrenia ([place])   Hyperlipidemia associated with type 2 diabetes mellitus ([place])   Dermatitis   Wound of sacral region   Hypothermia   Heather Petty is a 68 y.o. female with medical history significant for IDT2DM, hypertension, hyperlipidemia, asthma, anemia chronic disease and B12 deficiency, spongiotic dermatitis, morbid obesity, chronic pain, chronic sacral pressure ulcer, and depression/schizophrenia who is admitted with  acute kidney injury.  Acute kidney injury: BUN 72 and creatinine 4.41 compared to normal function on 02/21/2018.  Again suspected due to poor oral intake for dehydration and medication (benazepril, Lasix).  -Status post 1.5 L in the ED, start maintenance IV fluids overnight -Repeat labs in a.m. -Hold benazepril and Lasix  Insulin-dependent type 2 diabetes with hyperglycemia: Hyperglycemic on admission.  On Lantus  30 units nightly and glipizide as an outpatient. -Restart home Lantus 30 units nightly, add resistant SSI with HS coverage  Hypertension: Initially hypotensive on admission with improvement after IV fluids. -Hold home benazepril, Lasix, amlodipine  Dermatitis: Recent skin punch biopsy showed spongiform dermatitis.  Dermatology felt nutritional deficiency contributing as well. -Continue additional supplements and topical therapy  Chronic sacral wound: -Consult wound care  Hypothermia: Patient hypothermic on arrival with improvement after being placed on a warming blanket.  No obvious systemic infection on admission.  Continue to monitor.  Hyperlipidemia: -Continue pravastatin  Depression/schizophrenia: -Continue Risperdal and doxepin  Anemia: Likely commendation of anemia of chronic disease with macrocytic anemia from B12 deficiency.  Hemoglobin currently stable compared to baseline. -Continue B12 and monitor hemoglobin  Chronic pain: Holding home tramadol with AKI.   DVT prophylaxis: subq heparin Code Status: Full code, confirmed with patient Family Communication: None present at bedside admission Disposition Plan: Likely discharge back to [facility] rehab pending improvement in renal function Consults called: None Admission status: Inpatient   Zada Finders MD [facility] Pager [phone]  If 7PM-7AM, please contact night-coverage [email or web address]  02/28/2018, 11:36 PM

## 2018-02-28 NOTE — ED Notes (Signed)
Attempted to call report

## 2018-02-28 NOTE — ED Provider Notes (Signed)
[facility] EMERGENCY DEPARTMENT Provider Note   CSN: [id number] Arrival date & time: 02/28/18  1516     History   Chief Complaint Chief Complaint  Patient presents with  . Abnormal labs  . Hyperglycemia    HPI Heather Petty is a 68 y.o. female with a past medical history of IDDM, prior AKI, morbid obesity, HTN, schizophrenia, who presents to ED for evaluation of abnormal labwork. Patient resides at [facility].  She was hospitalized from 12/31 to 1/3 for AKI and diffuse rash.  She had routine labs drawn today at her follow-up appointment at [facility] and was found to have a creatinine of 3.94, BUN of 72, hemoglobin of 8.  She was also told that her blood sugar was in the 400s.  It appears that her creatinine increased from 0.79-3.94 today.  She reports generalized weakness since waking up this morning.  She does note some pain in the area of her chronic sacral wounds as well as dysuria.  She has these wounds changed and dressed daily at the facility.  She has been taking her medications as prescribed.  She reports some cough productive with mucus.  Denies any chest pain, abdominal pain, vomiting, diarrhea, blood in her stool, headache, fever.  HPI  Past Medical History:  Diagnosis Date  . AKI (acute kidney injury) ([place]) 12/17/2017  . Arthritis   . Asthma   . Depression   . Diabetes mellitus   . Gout   . High cholesterol   . Hypertension   . Morbid obesity ([place])   . Pneumonia 12/17/2017  . Schizophrenia [facility])     Patient Active Problem List   Diagnosis Date Noted  . Pressure injury of skin 02/06/2018  . Acute renal failure (ARF) ([place]) 02/05/2018  . Hyperlipidemia associated with type 2 diabetes mellitus ([facility]) 02/05/2018  . Hyperkalemia 02/05/2018  . Dermatitis 02/05/2018  . Open back wound 02/05/2018  . Dysuria 02/05/2018  . Paranoid schizophrenia ([place])   . CAP (community acquired pneumonia) 12/17/2017  . AKI (acute kidney injury) ([place])  12/17/2017  . DM2 (diabetes mellitus, type 2) ([place]) 12/17/2017  . HTN (hypertension) 12/17/2017  . Gout     Past Surgical History:  Procedure Laterality Date  . CYST EXCISION    . DENTAL SURGERY    . TONSILLECTOMY       OB History   No obstetric history on file.      Home Medications    Prior to Admission medications   Medication Sig Start Date End Date Taking? Authorizing Provider  acetaminophen (TYLENOL) 325 MG tablet Take 650 mg by mouth 4 (four) times daily as needed (for pain and not to exceed 3,000 mg in a 24-hr period).    Yes [provider]  albuterol (VENTOLIN HFA) 108 (90 Base) MCG/ACT inhaler Inhale 2 puffs into the lungs every 4 (four) hours as needed for wheezing or shortness of breath.   Yes [provider]  allopurinol (ZYLOPRIM) 300 MG tablet Take 300 mg by mouth daily.   Yes [provider]  amLODipine (NORVASC) 10 MG tablet Take 10 mg by mouth daily.   Yes [provider]  b complex vitamins tablet Take 1 tablet by mouth daily.   Yes [provider]  benazepril (LOTENSIN) 20 MG tablet Take 20 mg by mouth daily.   Yes [provider]  bisacodyl (DULCOLAX) 5 MG EC tablet Take 1 tablet (5 mg total) by mouth daily as needed for moderate  constipation. Patient taking differently: Take 5 mg by mouth at bedtime.  12/24/17  Yes Lady Deutscher, MD  cetirizine (ZYRTEC) 10 MG tablet Take 10 mg by mouth daily.    Yes [provider]  collagenase (SANTYL) ointment Apply topically daily. Patient taking differently: Apply 1 application topically See admin instructions. Apply to wound bed after cleansing, then cover with gauze moistened with normal saline- apply bordered silicone dressing daily 02/09/18  Yes Elgergawy, Silver Huguenin, MD  doxepin (SINEQUAN) 25 MG capsule Take 25 mg by mouth 2 (two) times daily.    Yes [provider]  famotidine (PEPCID) 20 MG tablet Take 20 mg by mouth at bedtime.    Yes  [provider]  folic acid (FOLVITE) 1 MG tablet Take 1 mg by mouth daily.   Yes [provider]  furosemide (LASIX) 20 MG tablet Take 20 mg by mouth daily.   Yes [provider]  gabapentin (NEURONTIN) 300 MG capsule Take 300 mg by mouth 3 (three) times daily.   Yes [provider]  glipiZIDE (GLUCOTROL XL) 5 MG 24 hr tablet Take 5 mg by mouth 2 (two) times daily.   Yes [provider]  guaiFENesin (ROBITUSSIN) 100 MG/5ML liquid Take 100 mg by mouth every 4 (four) hours as needed for cough or congestion. 02/21/18 03/02/18 Yes [provider]  hydrocortisone 2.5 % cream Apply 1 application topically See admin instructions. Apply to rash on face 2 times a day   Yes [provider]  hydrOXYzine (ATARAX/VISTARIL) 25 MG tablet Take 25 mg by mouth 3 (three) times daily.   Yes [provider]  insulin glargine (LANTUS) 100 UNIT/ML injection Inject 0.2 mLs (20 Units total) into the skin 2 (two) times daily. Patient taking differently: Inject 30 Units into the skin at bedtime.  12/23/17  Yes Purohit, Konrad Dolores, MD  ipratropium-albuterol (DUONEB) 0.5-2.5 (3) MG/3ML SOLN Take 3 mLs by nebulization 4 (four) times daily as needed (for shortness of breath or wheezing).   Yes [provider]  Multiple Vitamins-Minerals (THERA-M MULTIPLE VITAMINS PO) Take 1 tablet by mouth daily.   Yes [provider]  nystatin (MYCOSTATIN/NYSTOP) powder Apply topically See admin instructions. Apply between folds, beneath breasts, bilateral axillae, and under pannus   Yes [provider]  polyethylene glycol (MIRALAX / GLYCOLAX) packet Take 17 g by mouth 2 (two) times daily. Patient taking differently: Take 17 g by mouth daily as needed for mild constipation.  12/24/17  Yes Lady Deutscher, MD  pravastatin (PRAVACHOL) 40 MG tablet Take 40 mg by mouth daily.   Yes [provider]  risperiDONE (RISPERDAL) 0.5 MG tablet Take 1  tablet (0.5 mg total) by mouth 2 (two) times daily. 12/23/17 02/28/18 Yes Purohit, Konrad Dolores, MD  Skin Protectants, Misc. (EUCERIN) cream Apply 1 application topically See admin instructions. Apply to areas of "generalized body for dry/itchy skin" 2 times a day   Yes [provider]  traMADol (ULTRAM) 50 MG tablet Take 50 mg by mouth 4 (four) times daily as needed (for pain).   Yes [provider]  triamcinolone (KENALOG) 0.147 MG/GM topical spray Apply 1 spray topically See admin instructions. Apply to head in the direction of hair growth   Yes [provider]  vitamin B-6 (PYRIDOXINE) 25 MG tablet Take 25 mg by mouth daily.   Yes [provider]  vitamin C (ASCORBIC ACID) 500 MG tablet Take 500 mg by mouth 2 (two) times daily.  Yes [provider]  Vitamin D, Ergocalciferol, (DRISDOL) 1.25 MG (50000 UT) CAPS capsule Take 50,000 Units by mouth every Monday.    Yes [provider]  Zinc Sulfate (ZINC-220 PO) Take 220 mg by mouth daily.   Yes [provider]  Azelastine-Fluticasone (DYMISTA) 137-50 MCG/ACT SUSP Place 1 spray into the nose as needed (allergies).    [provider]  cyanocobalamin (,VITAMIN B-12,) 1000 MCG/ML injection Inject 1 mL (1,000 mcg total) into the muscle once a week. Patient not taking: Reported on 02/28/2018 02/09/18   Elgergawy, Silver Huguenin, MD  gabapentin (NEURONTIN) 600 MG tablet Take 0.5 tablets (300 mg total) by mouth 3 (three) times daily. Patient not taking: Reported on 02/28/2018 12/23/17 02/28/18  Purohit, Konrad Dolores, MD  potassium chloride (K-DUR) 10 MEQ tablet Take 10 mEq by mouth daily. 12/03/17   [provider]  silver sulfADIAZINE (SILVADENE) 1 % cream Apply topically 2 (two) times daily. Patient not taking: Reported on 02/28/2018 02/09/18   Elgergawy, Silver Huguenin, MD    Family History Family History  Family history unknown: Yes    Social History Social History   Tobacco Use  . Smoking  status: Former Research scientist (life sciences)  . Smokeless tobacco: Never Used  Substance Use Topics  . Alcohol use: No  . Drug use: No     Allergies   Peanut-containing drug products   Review of Systems Review of Systems  Constitutional: Negative for appetite change, chills and fever.  HENT: Negative for ear pain, rhinorrhea, sneezing and sore throat.   Eyes: Negative for photophobia and visual disturbance.  Respiratory: Positive for cough. Negative for chest tightness, shortness of breath and wheezing.   Cardiovascular: Negative for chest pain and palpitations.  Gastrointestinal: Negative for abdominal pain, blood in stool, constipation, diarrhea, nausea and vomiting.  Genitourinary: Positive for dysuria. Negative for hematuria and urgency.  Musculoskeletal: Negative for myalgias.  Skin: Positive for rash.  Neurological: Negative for dizziness, weakness and light-headedness.     Physical Exam Updated Vital Signs BP (!) 97/56   Pulse 91   Temp (S) (!) 94.9 F (34.9 C) (Rectal)   Resp 16   Ht 5\' 1"  (1.549 m)   Wt (!) 136.5 kg   SpO2 96%   BMI 56.86 kg/m   Physical Exam Vitals signs and nursing note reviewed.  Constitutional:      General: She is not in acute distress.    Appearance: She is well-developed. She is obese.  HENT:     Head: Normocephalic and atraumatic.     Nose: Nose normal.  Eyes:     General: No scleral icterus.       Left eye: No discharge.     Conjunctiva/sclera: Conjunctivae normal.  Neck:     Musculoskeletal: Normal range of motion and neck supple.  Cardiovascular:     Rate and Rhythm: Normal rate and regular rhythm.     Heart sounds: Normal heart sounds. No murmur. No friction rub. No gallop.   Pulmonary:     Effort: Pulmonary effort is normal. No respiratory distress.     Breath sounds: Normal breath sounds.  Abdominal:     General: Bowel sounds are normal. There is no distension.     Palpations: Abdomen is soft.     Tenderness: There is no abdominal  tenderness. There is no guarding.  Musculoskeletal: Normal range of motion.     Right lower leg: Edema present.     Left lower leg: Edema present.  Skin:  General: Skin is warm and dry.     Findings: Rash present.     Comments: Stage II sacral wounds noted with no purulent drainage or necrosis noted.  Dry flaky rash noted throughout body.  Hyperpigmentation noted diffusely.  Neurological:     Mental Status: She is alert.     Motor: No abnormal muscle tone.     Coordination: Coordination normal.      ED Treatments / Results  Labs (all labs ordered are listed, but only abnormal results are displayed) Labs Reviewed  COMPREHENSIVE METABOLIC PANEL - Abnormal; Notable for the following components:      Result Value   Sodium 131 (*)    CO2 17 (*)    Glucose, Bld 453 (*)    BUN 72 (*)    Creatinine, Ser 4.41 (*)    Total Protein 5.6 (*)    Albumin 2.5 (*)    ALT 75 (*)    Alkaline Phosphatase 451 (*)    GFR calc non Af Amer 10 (*)    GFR calc Af Amer 11 (*)    All other components within normal limits  CBC WITH DIFFERENTIAL/PLATELET - Abnormal; Notable for the following components:   RBC 2.66 (*)    Hemoglobin 8.2 (*)    HCT 27.0 (*)    MCV 101.5 (*)    RDW 16.4 (*)    Lymphs Abs 0.5 (*)    All other components within normal limits  URINALYSIS, ROUTINE W REFLEX MICROSCOPIC - Abnormal; Notable for the following components:   Color, Urine AMBER (*)    APPearance HAZY (*)    Glucose, UA 150 (*)    All other components within normal limits  LACTIC ACID, PLASMA - Abnormal; Notable for the following components:   Lactic Acid, Venous 2.1 (*)    All other components within normal limits  CBG MONITORING, ED - Abnormal; Notable for the following components:   Glucose-Capillary 373 (*)    All other components within normal limits  URINE CULTURE  CULTURE, BLOOD (ROUTINE X 2)  CULTURE, BLOOD (ROUTINE X 2)  TSH  LACTIC ACID, PLASMA  LACTIC ACID, PLASMA     EKG None  Radiology Ct Head Wo Contrast  Result Date: 02/28/2018 CLINICAL DATA:  Blurry vision. EXAM: CT HEAD WITHOUT CONTRAST TECHNIQUE: Contiguous axial images were obtained from the base of the skull through the vertex without intravenous contrast. COMPARISON:  None. FINDINGS: Brain: There is no evidence of acute infarct, intracranial hemorrhage, mass, midline shift, or extra-axial fluid collection. Mild cerebral atrophy is within normal limits for age. Cerebral white matter hypodensities are nonspecific but compatible with mild chronic small vessel ischemic disease. Vascular: Calcified atherosclerosis at the skull base. No hyperdense vessel. Skull: No fracture or focal osseous lesion. Sinuses/Orbits: Paranasal sinuses and mastoid air cells are clear. Unremarkable orbits. Other: None. IMPRESSION: 1. No evidence of acute intracranial abnormality. 2. Mild chronic small vessel ischemic disease. Electronically Signed   By: Logan Bores M.D.   On: 02/28/2018 17:42   Dg Chest Portable 1 View  Result Date: 02/28/2018 CLINICAL DATA:  Hyperglycemia EXAM: PORTABLE CHEST 1 VIEW COMPARISON:  02/05/2018 FINDINGS: Mild cardiomegaly. No focal consolidation or effusion. Aortic atherosclerosis. No pneumothorax. IMPRESSION: No active disease.  Mild cardiomegaly Electronically Signed   By: Donavan Foil M.D.   On: 02/28/2018 16:24    Procedures Procedures (including critical care time)  CRITICAL CARE Performed by: Delia Heady   Total critical care time: 45 minutes  Critical care  time was exclusive of separately billable procedures and treating other patients.  Critical care was necessary to treat or prevent imminent or life-threatening deterioration.  Critical care was time spent personally by me on the following activities: development of treatment plan with patient and/or surrogate as well as nursing, discussions with consultants, evaluation of patient's response to treatment, examination of  patient, obtaining history from patient or surrogate, ordering and performing treatments and interventions, ordering and review of laboratory studies, ordering and review of radiographic studies, pulse oximetry and re-evaluation of patient's condition.  Medications Ordered in ED Medications  sodium chloride 0.9 % bolus 1,000 mL (0 mLs Intravenous Stopped 02/28/18 1843)  sodium chloride 0.9 % bolus 500 mL (500 mLs Intravenous New Bag/Given 02/28/18 1809)     Initial Impression / Assessment and Plan / ED Course  I have reviewed the triage vital signs and the nursing notes.  Pertinent labs & imaging results that were available during my care of the patient were reviewed by me and considered in my medical decision making (see chart for details).  Clinical Course as of Feb 28 1910  Thu Feb 28, 2018  1556 Notes from [facility] dermatology on 1/3 about patient's rash Per Dermatology, likely r/t nutritional deficiencies. S/p punch biopsy of the right arm resulted spongiotic Dermatitis. Plan for further evaluation of slide by [facility] pathology  Zinc level 0.48, cont zinc supplements. Vitamin B 7 WNL, B 5 and niacin still pending. All remaining vitamin levels normal    [HK]    Clinical Course User Index [HK] Delia Heady, PA-C    68 year old female with a past medical history of insulin-dependent diabetes, morbid obesity, hypertension, prior AKI presents to ED for abnormal lab work.  She was at a follow-up appointment at her PCPs office after hospitalization which completed on 02/08/2018.  She had a creatinine of 1.4 when she left the hospital after admission.  She was found to have creatinine of 3.9 and BUN of 72 per lab work done in the office today.  She notes pain in the area of her chronic sacral wounds as well as dysuria.  On my exam patient has a diffuse scaly, hyperpigmented rash which appears to be worked up by [facility] dermatology.  She is on prednisone currently.  Sacral wounds appear chronic, with  no acute changes based on physical exam findings listed on paperwork.  She is hypothermic to 93 rectal.  Blood pressures in the 811 systolic.  Lab work significant for creatinine of 4.4, BUN of 72, hemoglobin of 8.2 which is slightly higher than her baseline.  Urinalysis is unremarkable.  Lactic slightly elevated at 2.1.  Chest x-ray is unremarkable.  She is alert and oriented.  She was given 1.5 L of fluids.  She will need to be admitted for further work-up of AKI.  Hospitalist to admit.    Portions of this note were generated with Lobbyist. Dictation errors may occur despite best attempts at proofreading.   Final Clinical Impressions(s) / ED Diagnoses   Final diagnoses:  Hyperglycemia  AKI (acute kidney injury) [facility])  Dehydration    ED Discharge Orders    None       Delia Heady, PA-C 02/28/18 1914    Davonna Belling, MD 03/01/18 0000

## 2018-02-28 NOTE — ED Notes (Signed)
Patient transported to CT 

## 2018-03-01 ENCOUNTER — Inpatient Hospital Stay (HOSPITAL_COMMUNITY): Payer: Medicare Other

## 2018-03-01 LAB — FOLATE: Folate: 20.7 ng/mL (ref >5.9)

## 2018-03-01 LAB — IRON AND TIBC
Iron: 69 ug/dL (ref 28–170)
SATURATION RATIOS: 32 % — AB (ref 10.4–31.8)
TIBC: 216 ug/dL — ABNORMAL LOW (ref 250–450)
UIBC: 147 ug/dL

## 2018-03-01 LAB — RETICULOCYTES
IMMATURE RETIC FRACT: 17.8 % — AB (ref 2.3–15.9)
RBC.: 2.43 MIL/uL — ABNORMAL LOW (ref 3.87–5.11)
Retic Count, Absolute: 77.3 10*3/uL (ref 19.0–186.0)
Retic Ct Pct: 3.2 % — ABNORMAL HIGH (ref 0.4–3.1)

## 2018-03-01 LAB — GLUCOSE, CAPILLARY
GLUCOSE-CAPILLARY: 83 mg/dL (ref 70–99)
Glucose-Capillary: 378 mg/dL — ABNORMAL HIGH (ref 70–99)
Glucose-Capillary: 293 mg/dL — ABNORMAL HIGH (ref 70–99)
Glucose-Capillary: 189 mg/dL — ABNORMAL HIGH (ref 70–99)

## 2018-03-01 LAB — ABO/RH: ABO/RH(D): O NEG

## 2018-03-01 LAB — BASIC METABOLIC PANEL
Anion gap: 11 (ref 5–15)
BUN: 71 mg/dL — ABNORMAL HIGH (ref 8–23)
CALCIUM: 8.4 mg/dL — AB (ref 8.9–10.3)
CO2: 16 mmol/L — ABNORMAL LOW (ref 22–32)
Chloride: 107 mmol/L (ref 98–111)
Creatinine, Ser: 3.61 mg/dL — ABNORMAL HIGH (ref 0.44–1.00)
GFR calc Af Amer: 14 mL/min — ABNORMAL LOW (ref >60)
GFR, EST NON AFRICAN AMERICAN: 12 mL/min — AB (ref >60)
Glucose, Bld: 432 mg/dL — ABNORMAL HIGH (ref 70–99)
Potassium: 5.3 mmol/L — ABNORMAL HIGH (ref 3.5–5.1)
Sodium: 134 mmol/L — ABNORMAL LOW (ref 135–145)

## 2018-03-01 LAB — FERRITIN: FERRITIN: 710 ng/mL — AB (ref 11–307)

## 2018-03-01 LAB — CBC
HCT: 25.3 % — ABNORMAL LOW (ref 36.0–46.0)
Hemoglobin: 7.7 g/dL — ABNORMAL LOW (ref 12.0–15.0)
MCH: 30.7 pg (ref 26.0–34.0)
MCHC: 30.4 g/dL (ref 30.0–36.0)
MCV: 100.8 fL — ABNORMAL HIGH (ref 80.0–100.0)
PLATELETS: 199 10*3/uL (ref 150–400)
RBC: 2.51 MIL/uL — ABNORMAL LOW (ref 3.87–5.11)
RDW: 16 % — ABNORMAL HIGH (ref 11.5–15.5)
WBC: 5.4 10*3/uL (ref 4.0–10.5)
nRBC: 0 % (ref 0.0–0.2)

## 2018-03-01 LAB — TYPE AND SCREEN
ABO/RH(D): O NEG
Antibody Screen: NEGATIVE

## 2018-03-01 LAB — MRSA PCR SCREENING: MRSA by PCR: NEGATIVE

## 2018-03-01 MED ORDER — INSULIN ASPART 100 UNIT/ML ~~LOC~~ SOLN
4.0000 [IU] | Freq: Three times a day (TID) | SUBCUTANEOUS | Status: DC
  Administered 2018-03-01 (×2): 4 [IU] via SUBCUTANEOUS

## 2018-03-01 MED ORDER — DOCUSATE SODIUM 100 MG PO CAPS
200.0000 mg | ORAL_CAPSULE | Freq: Two times a day (BID) | ORAL | Status: DC
  Administered 2018-03-01 – 2018-03-04 (×7): 200 mg via ORAL
  Filled 2018-03-01 (×9): qty 2

## 2018-03-01 MED ORDER — SODIUM POLYSTYRENE SULFONATE 15 GM/60ML PO SUSP
30.0000 g | Freq: Once | ORAL | Status: AC
  Administered 2018-03-01: 30 g via ORAL
  Filled 2018-03-01: qty 120

## 2018-03-01 MED ORDER — FOLIC ACID 1 MG PO TABS
1.0000 mg | ORAL_TABLET | Freq: Every day | ORAL | Status: DC
  Administered 2018-03-01 – 2018-03-05 (×5): 1 mg via ORAL
  Filled 2018-03-01 (×5): qty 1

## 2018-03-01 MED ORDER — BISACODYL 5 MG PO TBEC
10.0000 mg | DELAYED_RELEASE_TABLET | Freq: Once | ORAL | Status: AC
  Administered 2018-03-01: 10 mg via ORAL
  Filled 2018-03-01: qty 2

## 2018-03-01 MED ORDER — BISACODYL 5 MG PO TBEC
5.0000 mg | DELAYED_RELEASE_TABLET | Freq: Every day | ORAL | Status: DC
  Administered 2018-03-01 – 2018-03-03 (×3): 5 mg via ORAL
  Filled 2018-03-01 (×4): qty 1

## 2018-03-01 MED ORDER — FAMOTIDINE 20 MG PO TABS
20.0000 mg | ORAL_TABLET | Freq: Every day | ORAL | Status: DC
  Administered 2018-03-01 – 2018-03-04 (×4): 20 mg via ORAL
  Filled 2018-03-01 (×4): qty 1

## 2018-03-01 MED ORDER — GABAPENTIN 600 MG PO TABS: 300.0000 mg | ORAL_TABLET | Freq: Three times a day (TID) | ORAL | Status: DC

## 2018-03-01 MED ORDER — SODIUM CHLORIDE 0.9 % IV SOLN
INTRAVENOUS | Status: AC
  Administered 2018-03-01 – 2018-03-02 (×2): via INTRAVENOUS

## 2018-03-01 MED ORDER — HYDROCORTISONE 1 % EX CREA
1.0000 "application " | TOPICAL_CREAM | Freq: Two times a day (BID) | CUTANEOUS | Status: DC
  Administered 2018-03-01 – 2018-03-05 (×9): 1 via TOPICAL
  Filled 2018-03-01: qty 28

## 2018-03-01 MED ORDER — TRIAMCINOLONE ACETONIDE 0.1 % EX CREA: 1.0000 "application " | TOPICAL_CREAM | CUTANEOUS | Status: DC | PRN

## 2018-03-01 MED ORDER — INSULIN GLARGINE 100 UNIT/ML ~~LOC~~ SOLN
30.0000 [IU] | Freq: Two times a day (BID) | SUBCUTANEOUS | Status: DC
  Administered 2018-03-01: 30 [IU] via SUBCUTANEOUS
  Filled 2018-03-01 (×2): qty 0.3

## 2018-03-01 NOTE — Progress Notes (Signed)
Physical Therapy Evaluation Patient Details Name: Heather Petty MRN: [id number] DOB: 08/28/1950 Today's Date: 03/01/2018   History of Present Illness  Patient is 68 y/o female presenting to ED from SNF secondary to AKI and hyperglycemia. Imaging was positive for adynamic ileus. Patient had recent admission at [facility] from 02/05/2018-02/08/2018 for AKI and dermatitis subsequently transferred to [facility] for dermatology evaluation with eventual discharge on 02/21/2018. PMH includes DMII, HTN, asthma, parnoid schizophrenia, HLD, morbid obesity, dermatitis, and chronic sacral wounds.    Clinical Impression  Patient admitted to hospital secondary to above problems with deficits below. Patient required modA for all bed mobility and modA +2 for transfers with RW. Patient with increased pain while standing so further mobility was deferred. Patient would benefit from acute physical therapy to maximize independence and safety with functional mobility.     Follow Up Recommendations SNF;Supervision/Assistance - 24 hour    Equipment Recommendations  None recommended by PT    Recommendations for Other Services       Precautions / Restrictions Precautions Precautions: Fall Restrictions Weight Bearing Restrictions: No      Mobility  Bed Mobility Overal bed mobility: Needs Assistance Bed Mobility: Supine to Sit;Sit to Supine     Supine to sit: Mod assist Sit to supine: Mod assist   General bed mobility comments: Patient required mod A for BLE and trunk assist for all bed mobility    Transfers Overall transfer level: Needs assistance Equipment used: Rolling walker (2 wheeled) Transfers: Sit to/from Stand Sit to Stand: Mod assist;+2 physical assistance         General transfer comment: Patient required modA +2 for lift assist to stand with RW. Patient unable to take lateral steps in standing.  Patient wanting to sit down due to pain levels so further  mobility deferred.   Ambulation/Gait                Stairs            Wheelchair Mobility    Modified Rankin (Stroke Patients Only)       Balance Overall balance assessment: Needs assistance Sitting-balance support: No upper extremity supported Sitting balance-Leahy Scale: Fair Sitting balance - Comments: Patient able to maintain sitting EOB without LOB   Standing balance support: Bilateral upper extremity supported Standing balance-Leahy Scale: Poor Standing balance comment: reliant on BUE support and RW to maintain standing balance                             Pertinent Vitals/Pain Pain Assessment: 0-10 Pain Score: 8  Pain Location: pain "everywhere". Wounds in skin folds front/back and over bony prominences Pain Descriptors / Indicators: Grimacing;Moaning;Discomfort;Tender Pain Intervention(s): Limited activity within patient's tolerance;Monitored during session;Repositioned;Relaxation    Home Living Family/patient expects to be discharged to:: Skilled nursing facility                      Prior Function Level of Independence: Needs assistance   Gait / Transfers Assistance Needed: assistance needed to transfer to and from wheelchair with RW. Patient reports working with therapy for ambulation  ADL's / Homemaking Assistance Needed: has help with ADLS and IADLs        Hand Dominance        Extremity/Trunk Assessment   Upper Extremity Assessment Upper Extremity Assessment: Overall WFL for tasks assessed    Lower Extremity Assessment Lower Extremity Assessment: Generalized weakness;RLE deficits/detail;LLE deficits/detail  RLE Deficits / Details: Patient with generalized weakness in BLE with inability to complete SLR. LLE Deficits / Details: Patient with generalized weakness in BLE with inability to complete SLR.    Cervical / Trunk Assessment Cervical / Trunk Assessment: Normal  Communication   Communication: Other  (comment)(can speak english)  Cognition Arousal/Alertness: Awake/alert Behavior During Therapy: Anxious Overall Cognitive Status: Within Functional Limits for tasks assessed                                        General Comments General comments (skin integrity, edema, etc.): Patient oxygen saturations >90% on RA for all of session with mobility     Exercises     Assessment/Plan    PT Assessment Patient needs continued PT services  PT Problem List Decreased strength;Decreased range of motion;Decreased activity tolerance;Decreased balance;Decreased mobility;Decreased knowledge of use of DME;Decreased skin integrity;Obesity;Pain;Decreased knowledge of precautions       PT Treatment Interventions DME instruction;Gait training;Functional mobility training;Therapeutic activities;Therapeutic exercise;Balance training;Patient/family education    PT Goals (Current goals can be found in the Care Plan section)  Acute Rehab PT Goals Patient Stated Goal: to feel better PT Goal Formulation: With patient Time For Goal Achievement: 03/15/18 Potential to Achieve Goals: Fair    Frequency Min 2X/week   Barriers to discharge        Co-evaluation               AM-PAC PT "6 Clicks" Mobility  Outcome Measure Help needed turning from your back to your side while in a flat bed without using bedrails?: A Lot Help needed moving from lying on your back to sitting on the side of a flat bed without using bedrails?: A Lot Help needed moving to and from a bed to a chair (including a wheelchair)?: A Lot Help needed standing up from a chair using your arms (e.g., wheelchair or bedside chair)?: A Lot Help needed to walk in hospital room?: Total Help needed climbing 3-5 steps with a railing? : Total 6 Click Score: 10    End of Session Equipment Utilized During Treatment: Gait belt Activity Tolerance: Patient limited by pain Patient left: in bed;with bed alarm set;with call  bell/phone within reach Nurse Communication: Mobility status PT Visit Diagnosis: Unsteadiness on feet (R26.81);Muscle weakness (generalized) (M62.81);Difficulty in walking, not elsewhere classified (R26.2) Pain - part of body: (painful wounds)    Time: 7867-6720 PT Time Calculation (min) (ACUTE ONLY): 20 min   Charges:   PT Evaluation $PT Eval Moderate Complexity: 1 Mod          Erick Blinks, SPT  Erick Blinks 03/01/2018, 5:59 PM

## 2018-03-01 NOTE — Progress Notes (Signed)
Patient admitted from ED. Patient is alert and oriented x3. Vital signs are stable. Skin assessment done with another nurse. Iv in place and running fluid. Patient given medicine for pain and schedule as well. Patient oriented about room, call bell and phone. Bed in low position and call bell in reach.

## 2018-03-01 NOTE — Progress Notes (Signed)
PROGRESS NOTE                                                                                                                                                                                                             Patient Demographics:    Heather Petty, is a 68 y.o. female, DOB - [birth year], ZOX:[id number]  Admit date - 02/28/2018   Admitting Physician Lenore Cordia, MD  Outpatient Primary MD for the patient is [name], MD  LOS - 1  Chief Complaint  Patient presents with  . Abnormal labs  . Hyperglycemia       Brief Narrative Heather Petty is a 68 y.o. female with medical history significant for IDT2DM, hypertension, hyperlipidemia, asthma, anemia chronic disease and B12 deficiency, spongiotic dermatitis, morbid obesity, chronic pain, chronic sacral pressure ulcer, and depression/schizophrenia who presents the ED from her SNF due to labs showing acute kidney injury and hyperglycemia.  Patient had recent admission at [facility] from 02/05/2018-02/08/2018 for AKI and dermatitis subsequently transferred to [facility] for dermatology evaluation with eventual discharge on 02/21/2018.  Her renal function improved with less creatinine 0.79 on 02/21/2018.  Her HCTZ was discontinued and her Lasix was decreased to 20 mg p.o. daily.  Her home benazepril 20 mg was continued.  Her rash was found to be spongiotic dermatitis and felt related to nutritional deficiency by dermatology.   Subjective:    Heather Petty today has, No headache, No chest pain, No abdominal pain - No Nausea, No new weakness tingling or numbness, No Cough - SOB.  Mild Nausea.   Assessment  & Plan :     1.  DM type II in poor control. Adjusted Lantus, added pre meal Novulog and sliding scale.  Monitor and adjust.  Lab Results  Component Value Date   HGBA1C 6.7 (H) 12/18/2017   CBG (last 3)  Recent Labs    02/28/18 1648 02/28/18 2150 03/01/18 0808  GLUCAP 373*  481* 378*    2. Recent spongiotic dermatitis and felt related to nutritional deficiency by dermatologist at [facility] - resume steroid creams and nutritional supplements.  3.  ARF.  Appears prerenal.  Hydrate and monitor.  4.  Hyperkalemia.  Kayexalate along with bowel regimen.  5.  Essential hypertension.  Currently blood pressure stable monitor.  For now stay off of hypertensive medications.  6. Chronic sacral decubitus ulcer.  Kindly see wound care consult notes for details.  Present on admission.  7.  Weakness  and deconditioning.  Initiated PT.  Patient states she is virtually bedbound at this time.  8.  Anemia of chronic disease.  Monitor.  Also has element of B12 deficiency, B12 supplementation continued.  9.  Chronic pain.  Supportive care.  10.  History of depression and schizophrenia.  Continue home medications which include Risperdal and doxepin.  11.  Dyslipidemia.  On statin continue.  12.  Hypothermia upon admission.  No signs of sepsis, this was likely due to dehydration and exposure to elements.  Resolved after supportive care.    Family Communication  :  None  Code Status :  Full  Disposition Plan  :  TBD  Consults  :  None  Procedures  :    DVT Prophylaxis  :   Heparin   Lab Results  Component Value Date   PLT 199 03/01/2018    Diet :  Diet Order            Diet heart healthy/carb modified Room service appropriate? Yes; Fluid consistency: Thin  Diet effective now               Inpatient Medications Scheduled Meds: . B-complex with vitamin C  1 tablet Oral Daily  . collagenase  1 application Topical Daily  . cyanocobalamin  1,000 mcg Intramuscular Weekly  . docusate sodium  200 mg Oral BID  . doxepin  25 mg Oral BID  . heparin  5,000 Units Subcutaneous Q8H  . hydrocerin  1 application Topical BID  . hydrOXYzine  12.5 mg Oral TID  . insulin aspart  0-20 Units Subcutaneous TID WC  . insulin aspart  0-5 Units Subcutaneous QHS  . insulin  glargine  30 Units Subcutaneous QHS  . pravastatin  40 mg Oral Daily  . risperiDONE  0.5 mg Oral BID  . vitamin B-6  25 mg Oral Daily  . vitamin C  500 mg Oral BID  . [START ON 03/04/2018] Vitamin D (Ergocalciferol)  50,000 Units Oral Q Mon  . zinc sulfate  220 mg Oral Daily   Continuous Infusions: . sodium chloride     PRN Meds:.acetaminophen **OR** [DISCONTINUED] acetaminophen, ipratropium-albuterol  Antibiotics  :   Anti-infectives (From admission, onward)   None          Objective:   Vitals:   02/28/18 1915 02/28/18 1930 02/28/18 2118 03/01/18 0543  BP: (!) 117/50 (!) 105/52 (!) 119/48 (!) 119/55  Pulse: 78 92 (!) 101 97  Resp: 20 16    Temp: 97.6 F (36.4 C)  98.3 F (36.8 C) 97.8 F (36.6 C)  TempSrc: Oral   Oral  SpO2: 96% 92% 100% 100%  Weight:      Height:        Wt Readings from Last 3 Encounters:  02/28/18 (!) 136.5 kg  02/07/18 (!) 136.1 kg  12/17/17 (!) 136.1 kg     Intake/Output Summary (Last 24 hours) at 03/01/2018 1051 Last data filed at 03/01/2018 0533 Gross per 24 hour  Intake 2643.01 ml  Output 1 ml  Net 2642.01 ml     Physical Exam  Awake Alert,  No new F.N deficits, Normal affect Heather Petty.AT,PERRAL Supple Neck,No JVD, No cervical lymphadenopathy appriciated.  Symmetrical Chest wall movement, Good air movement bilaterally, CTAB RRR,No Gallops,Rubs or new Murmurs, No Parasternal Heave +ve B.Sounds, Abd Soft, No tenderness, No organomegaly appriciated, No rebound - guarding or rigidity. No Cyanosis, Clubbing , trace edema, No new Rash or bruise     Data Review:  CBC Recent Labs  Lab 02/28/18 1612 03/01/18 0456  WBC 5.4 5.4  HGB 8.2* 7.7*  HCT 27.0* 25.3*  PLT 170 199  MCV 101.5* 100.8*  MCH 30.8 30.7  MCHC 30.4 30.4  RDW 16.4* 16.0*  LYMPHSABS 0.5*  --   MONOABS 0.2  --   EOSABS 0.0  --   BASOSABS 0.0  --     Chemistries  Recent Labs  Lab 02/28/18 1612 03/01/18 0456  NA 131* 134*  K 4.8 5.3*  CL 100 107  CO2 17*  16*  GLUCOSE 453* 432*  BUN 72* 71*  CREATININE 4.41* 3.61*  CALCIUM 8.9 8.4*  AST 26  --   ALT 75*  --   ALKPHOS 451*  --   BILITOT 0.8  --    ------------------------------------------------------------------------------------------------------------------ No results for input(s): CHOL, HDL, LDLCALC, TRIG, CHOLHDL, LDLDIRECT in the last 72 hours.  Lab Results  Component Value Date   HGBA1C 6.7 (H) 12/18/2017   ------------------------------------------------------------------------------------------------------------------ Recent Labs    02/28/18 1730  TSH 3.106   ------------------------------------------------------------------------------------------------------------------ No results for input(s): VITAMINB12, FOLATE, FERRITIN, TIBC, IRON, RETICCTPCT in the last 72 hours.  Coagulation profile No results for input(s): INR, PROTIME in the last 168 hours.  No results for input(s): DDIMER in the last 72 hours.  Cardiac Enzymes No results for input(s): CKMB, TROPONINI, MYOGLOBIN in the last 168 hours.  Invalid input(s): CK ------------------------------------------------------------------------------------------------------------------ No results found for: BNP  Micro Results Recent Results (from the past 240 hour(s))  MRSA PCR Screening     Status: None   Collection Time: 02/28/18  9:46 PM  Result Value Ref Range Status   MRSA by PCR NEGATIVE NEGATIVE Final    Comment:        The GeneXpert MRSA Assay (FDA approved for NASAL specimens only), is one component of a comprehensive MRSA colonization surveillance program. It is not intended to diagnose MRSA infection nor to guide or monitor treatment for MRSA infections. Performed at [facility], [place] [address], [place], [place] [address]     Radiology Reports Dg Chest 2 View  Result Date: 02/05/2018 CLINICAL DATA:  Cough, fever EXAM: CHEST - 2 VIEW COMPARISON:  12/17/2017 FINDINGS: Cardiomegaly. No  confluent airspace opacities, effusions or edema. No acute bony abnormality. IMPRESSION: Cardiomegaly.  No active disease. Electronically Signed   By: Rolm Baptise M.D.   On: 02/05/2018 12:00   Ct Head Wo Contrast  Result Date: 02/28/2018 CLINICAL DATA:  Blurry vision. EXAM: CT HEAD WITHOUT CONTRAST TECHNIQUE: Contiguous axial images were obtained from the base of the skull through the vertex without intravenous contrast. COMPARISON:  None. FINDINGS: Brain: There is no evidence of acute infarct, intracranial hemorrhage, mass, midline shift, or extra-axial fluid collection. Mild cerebral atrophy is within normal limits for age. Cerebral white matter hypodensities are nonspecific but compatible with mild chronic small vessel ischemic disease. Vascular: Calcified atherosclerosis at the skull base. No hyperdense vessel. Skull: No fracture or focal osseous lesion. Sinuses/Orbits: Paranasal sinuses and mastoid air cells are clear. Unremarkable orbits. Other: None. IMPRESSION: 1. No evidence of acute intracranial abnormality. 2. Mild chronic small vessel ischemic disease. Electronically Signed   By: Logan Bores M.D.   On: 02/28/2018 17:42   US Renal  Result Date: 02/06/2018 CLINICAL DATA:  Acute renal failure EXAM: RENAL / URINARY TRACT ULTRASOUND COMPLETE COMPARISON:  12/17/2017 CT abdomen/pelvis. FINDINGS: Examination limited by patient body habitus. Right Kidney: Renal measurements: 9.6 x 4.4 x 5.6 cm = volume: 123 mL .  Echogenicity within normal limits. No mass or hydronephrosis visualized. Incidentally noted diffuse hepatic steatosis in the visualized right liver lobe. Left Kidney: Renal measurements: 9.5 x 4.5 x 4.6 cm = volume: 102 mL. Echogenicity within normal limits. No mass or hydronephrosis visualized. Bladder: Appears normal for degree of bladder distention. IMPRESSION: Normal ultrasound of the kidneys and bladder. No hydronephrosis. Incidental diffuse hepatic steatosis. Electronically Signed   By:  Ilona Sorrel M.D.   On: 02/06/2018 00:24   Dg Chest Portable 1 View  Result Date: 02/28/2018 CLINICAL DATA:  Hyperglycemia EXAM: PORTABLE CHEST 1 VIEW COMPARISON:  02/05/2018 FINDINGS: Mild cardiomegaly. No focal consolidation or effusion. Aortic atherosclerosis. No pneumothorax. IMPRESSION: No active disease.  Mild cardiomegaly Electronically Signed   By: Donavan Foil M.D.   On: 02/28/2018 16:24    Time Spent in minutes  30   Lala Lund M.D on 03/01/2018 at 10:51 AM  To page go to [email or web address] - password [email or web address]

## 2018-03-01 NOTE — Consult Note (Addendum)
[facility] Nurse wound consult note Reason for Consult: Patient with recent hospitalization here at [facility] with a subsequent discharge/transfer to [facility] in [place] for a dermatology consultation. Her skin condition/rash was found to be spongiotic dermatitis according to the medical record and felt related to nutritional deficiency by dermatology. This exceeds the scope of [place]. She was seen by my partner, S. Tora Perches on 02/07/18. Wound type: See above Pressure Injury POA: Yes Measurement: linear areas on the back including in the skin folds, posterior knees L>R, and buttock areas. Back disruption in skin integrity measures 7cm x 16cm and is full thickness (not pressure) and with necrotic tissue obscuring part of the wound bed.  The posterior right knee open wound measures 2cm x 6cm x 0.2cm and is red with small amount of serous exudate. Sacral area with linear ulceration measuring 2cm x 5cm with yellow eschar obscuring wound depth. This was described as a pressure injury previously, but the linear presentation begs a more complex differential diagnosis to be conducted Wound bed: As described above Drainage (amount, consistency, odor)  Periwound: Peeling, dry dermatitis (dry desquamation) Dressing procedure/placement/frequency: Suggest consultation with Dermatology at [facility] for continued treatment recommendations as a POC exceeds the scope of [place].  In the interim, I will provide a mattress replacement with low air loss feature for management of the microclimate (she is incontinent of urine with an external female device in place, PurWick) and to facilitate mobility.  I will provide our house antimicrobial textile, InterDry Ag+ for use in the areas of the posterior knees and back skin folds. Nonadherent silicone wound contact layers can be used to cover wounds followed by application of ABD pads and securement with paper tape.  [facility] nursing team will not follow, but  will remain available to this patient, the nursing and medical teams.  Please re-consult if needed. Thanks, Maudie Flakes, MSN, RN, [place], Arther Abbott  Pager# [phone]

## 2018-03-02 LAB — GLUCOSE, CAPILLARY
GLUCOSE-CAPILLARY: 33 mg/dL — AB (ref 70–99)
GLUCOSE-CAPILLARY: 140 mg/dL — AB (ref 70–99)
GLUCOSE-CAPILLARY: 52 mg/dL — AB (ref 70–99)
Glucose-Capillary: 89 mg/dL (ref 70–99)
Glucose-Capillary: 81 mg/dL (ref 70–99)
Glucose-Capillary: 77 mg/dL (ref 70–99)

## 2018-03-02 LAB — BASIC METABOLIC PANEL
Anion gap: 10 (ref 5–15)
BUN: 68 mg/dL — ABNORMAL HIGH (ref 8–23)
CO2: 18 mmol/L — ABNORMAL LOW (ref 22–32)
Calcium: 8.5 mg/dL — ABNORMAL LOW (ref 8.9–10.3)
Chloride: 113 mmol/L — ABNORMAL HIGH (ref 98–111)
Creatinine, Ser: 2.55 mg/dL — ABNORMAL HIGH (ref 0.44–1.00)
GFR calc Af Amer: 21 mL/min — ABNORMAL LOW (ref >60)
GFR calc non Af Amer: 19 mL/min — ABNORMAL LOW (ref >60)
Glucose, Bld: 46 mg/dL — ABNORMAL LOW (ref 70–99)
Potassium: 4.3 mmol/L (ref 3.5–5.1)
SODIUM: 141 mmol/L (ref 135–145)

## 2018-03-02 LAB — CBC
HCT: 24.9 % — ABNORMAL LOW (ref 36.0–46.0)
Hemoglobin: 7.7 g/dL — ABNORMAL LOW (ref 12.0–15.0)
MCH: 31.3 pg (ref 26.0–34.0)
MCHC: 30.9 g/dL (ref 30.0–36.0)
MCV: 101.2 fL — ABNORMAL HIGH (ref 80.0–100.0)
Platelets: 204 10*3/uL (ref 150–400)
RBC: 2.46 MIL/uL — ABNORMAL LOW (ref 3.87–5.11)
RDW: 16.8 % — ABNORMAL HIGH (ref 11.5–15.5)
WBC: 5.8 10*3/uL (ref 4.0–10.5)
nRBC: 0.3 % — ABNORMAL HIGH (ref 0.0–0.2)

## 2018-03-02 LAB — MAGNESIUM: Magnesium: 2.3 mg/dL (ref 1.7–2.4)

## 2018-03-02 LAB — URINE CULTURE: Culture: >=100000 — AB

## 2018-03-02 LAB — VITAMIN B12: Vitamin B-12: >7500 pg/mL — ABNORMAL HIGH (ref 180–914)

## 2018-03-02 MED ORDER — DEXTROSE 50 % IV SOLN
1.0000 | Freq: Once | INTRAVENOUS | Status: AC
  Administered 2018-03-02: 50 mL via INTRAVENOUS

## 2018-03-02 MED ORDER — INSULIN GLARGINE 100 UNIT/ML ~~LOC~~ SOLN
30.0000 [IU] | Freq: Two times a day (BID) | SUBCUTANEOUS | Status: DC
  Filled 2018-03-02: qty 0.3

## 2018-03-02 MED ORDER — HYOSCYAMINE SULFATE 0.125 MG SL SUBL
0.2500 mg | SUBLINGUAL_TABLET | Freq: Once | SUBLINGUAL | Status: AC
  Administered 2018-03-02: 0.25 mg via SUBLINGUAL
  Filled 2018-03-02 (×2): qty 2

## 2018-03-02 MED ORDER — DEXTROSE 50 % IV SOLN
INTRAVENOUS | Status: AC
  Administered 2018-03-02: 50 mL via INTRAVENOUS
  Filled 2018-03-02: qty 50

## 2018-03-02 MED ORDER — INSULIN ASPART 100 UNIT/ML ~~LOC~~ SOLN: 3.0000 [IU] | Freq: Three times a day (TID) | SUBCUTANEOUS | Status: DC

## 2018-03-02 MED ORDER — INSULIN GLARGINE 100 UNIT/ML ~~LOC~~ SOLN
20.0000 [IU] | Freq: Two times a day (BID) | SUBCUTANEOUS | Status: DC
  Administered 2018-03-03 (×2): 20 [IU] via SUBCUTANEOUS
  Filled 2018-03-02 (×3): qty 0.2

## 2018-03-02 NOTE — Progress Notes (Signed)
Pt. Complaining of new onset pain in abdomen.  Pt. Is tachy, probably due to pain.  Her bowel sounds are hypoactive.  Pt is voiding on command into purewick.  Md made aware.  Will continue to monitor.

## 2018-03-02 NOTE — Progress Notes (Signed)
Did a bladder scan on patient showed about 150 to 170 ml of residual in bladder after voiding.  Told patient we were going to do an in and out and she voided again on command.  243ml.  Still complaining of pain in abdomen.   Emptied total of 1060ml from [place].

## 2018-03-02 NOTE — Progress Notes (Signed)
PROGRESS NOTE                                                                                                                                                                                                             Patient Demographics:    Heather Petty, is a 68 y.o. female, DOB - [birth year], TKP:[id number]  Admit date - 02/28/2018   Admitting Physician Lenore Cordia, MD  Outpatient Primary MD for the patient is [name], MD  LOS - 2  Chief Complaint  Patient presents with  . Abnormal labs  . Hyperglycemia       Brief Narrative Heather Petty is a 68 y.o. female with medical history significant for IDT2DM, hypertension, hyperlipidemia, asthma, anemia chronic disease and B12 deficiency, spongiotic dermatitis, morbid obesity, chronic pain, chronic sacral pressure ulcer, and depression/schizophrenia who presents the ED from her SNF due to labs showing acute kidney injury and hyperglycemia.  Patient had recent admission at [facility] from 02/05/2018-02/08/2018 for AKI and dermatitis subsequently transferred to [facility] for dermatology evaluation with eventual discharge on 02/21/2018.  Her renal function improved with less creatinine 0.79 on 02/21/2018.  Her HCTZ was discontinued and her Lasix was decreased to 20 mg p.o. daily.  Her home benazepril 20 mg was continued.  Her rash was found to be spongiotic dermatitis and felt related to nutritional deficiency by dermatology.   Subjective:   Patient in bed, appears comfortable, denies any headache, no fever, no chest pain or pressure, no shortness of breath , no abdominal pain. No focal weakness.   Assessment  & Plan :     1.  DM type II in poor control.  Patient dietary compliance at the facility as with minimal adjustment she became hypoglycemic on 03/02/2018, have adjusted Lantus, pre-meal and sliding scale insulin, monitor CBGs  Lab Results  Component Value Date   HGBA1C 6.7 (H)  12/18/2017   CBG (last 3)  Recent Labs    03/01/18 2201 03/02/18 0758 03/02/18 0846  GLUCAP 83 33* 89    2. Recent spongiotic dermatitis and felt related to nutritional deficiency by dermatologist at [facility] - resumed steroid creams and nutritional supplements.  3.  ARF.  Prerenal improving with hydration continue.  4.  Hyperkalemia.  resolved after Kayexalate.  5.  Essential hypertension.  Currently blood pressure stable monitor.  For now stay off of hypertensive medications.  6. Chronic sacral decubitus ulcer.  Kindly see wound care consult notes for details.  Present on admission.  7.  Weakness and deconditioning.  Initiated PT.  Patient states she is virtually bedbound at this time.  8.  Anemia of chronic disease.  Stable anemia panel, B12 levels are if anything high in the setting of B12 deficiency, monitor, no need for transfusion currently.  I expect further fall with ongoing hydration.  No signs of GI bleed.  9.  Chronic pain.  Supportive care.  10.  History of depression and schizophrenia.  Continue home medications which include Risperdal and doxepin.  11.  Dyslipidemia.  On statin continue.  12.  Hypothermia upon admission.  No signs of sepsis, this was likely due to dehydration and exposure to elements.  Resolved after supportive care.    Family Communication  :  None  Code Status :  Full  Disposition Plan  :  TBD  Consults  :  None  Procedures  :    DVT Prophylaxis  :   Heparin   Lab Results  Component Value Date   PLT 204 03/02/2018    Diet :  Diet Order            Diet heart healthy/carb modified Room service appropriate? Yes; Fluid consistency: Thin  Diet effective now               Inpatient Medications Scheduled Meds: . B-complex with vitamin C  1 tablet Oral Daily  . bisacodyl  5 mg Oral QHS  . collagenase  1 application Topical Daily  . cyanocobalamin  1,000 mcg Intramuscular Weekly  . docusate sodium  200 mg Oral BID  . doxepin   25 mg Oral BID  . famotidine  20 mg Oral QHS  . folic acid  1 mg Oral Daily  . heparin  5,000 Units Subcutaneous Q8H  . hydrocerin  1 application Topical BID  . hydrocortisone cream  1 application Topical BID  . hydrOXYzine  12.5 mg Oral TID  . insulin aspart  0-20 Units Subcutaneous TID WC  . insulin aspart  3 Units Subcutaneous TID WC  . insulin glargine  30 Units Subcutaneous BID  . pravastatin  40 mg Oral Daily  . risperiDONE  0.5 mg Oral BID  . vitamin B-6  25 mg Oral Daily  . vitamin C  500 mg Oral BID  . [START ON 03/04/2018] Vitamin D (Ergocalciferol)  50,000 Units Oral Q Mon  . zinc sulfate  220 mg Oral Daily   Continuous Infusions: . sodium chloride 75 mL/hr at 03/02/18 0221   PRN Meds:.acetaminophen **OR** [DISCONTINUED] acetaminophen, ipratropium-albuterol, triamcinolone cream  Antibiotics  :   Anti-infectives (From admission, onward)   None          Objective:   Vitals:   03/01/18 0543 03/01/18 1455 03/01/18 2231 03/02/18 0533  BP: (!) 119/55 (!) 119/52 (!) 128/51 (!) 106/46  Pulse: 97 94 (!) 110 93  Resp:   20 18  Temp: 97.8 F (36.6 C) (!) 97.5 F (36.4 C) 98.3 F (36.8 C) 98 F (36.7 C)  TempSrc: Oral Oral Oral Oral  SpO2: 100% 96% 95% 95%  Weight:    124.2 kg  Height:        Wt Readings from Last 3 Encounters:  03/02/18 124.2 kg  02/07/18 (!) 136.1 kg  12/17/17 (!) 136.1 kg     Intake/Output Summary (Last 24 hours) at 03/02/2018 0913 Last data filed at 03/02/2018 0540 Gross per 24 hour  Intake 1140 ml  Output 900 ml  Net 240  ml     Physical Exam  Awake, slightly drowsy, No new F.N deficits, Normal affect [place].AT,PERRAL Supple Neck,No JVD, No cervical lymphadenopathy appriciated.  Symmetrical Chest wall movement, Good air movement bilaterally, CTAB RRR,No Gallops, Rubs or new Murmurs, No Parasternal Heave +ve B.Sounds, Abd Soft, No tenderness, No organomegaly appriciated, No rebound - guarding or rigidity. No Cyanosis, Clubbing or  edema, stable dry exfoliative old diffuse rash     Data Review:    CBC Recent Labs  Lab 02/28/18 1612 03/01/18 0456 03/02/18 0313  WBC 5.4 5.4 5.8  HGB 8.2* 7.7* 7.7*  HCT 27.0* 25.3* 24.9*  PLT 170 199 204  MCV 101.5* 100.8* 101.2*  MCH 30.8 30.7 31.3  MCHC 30.4 30.4 30.9  RDW 16.4* 16.0* 16.8*  LYMPHSABS 0.5*  --   --   MONOABS 0.2  --   --   EOSABS 0.0  --   --   BASOSABS 0.0  --   --     Chemistries  Recent Labs  Lab 02/28/18 1612 03/01/18 0456 03/02/18 0313  NA 131* 134* 141  K 4.8 5.3* 4.3  CL 100 107 113*  CO2 17* 16* 18*  GLUCOSE 453* 432* 46*  BUN 72* 71* 68*  CREATININE 4.41* 3.61* 2.55*  CALCIUM 8.9 8.4* 8.5*  MG  --   --  2.3  AST 26  --   --   ALT 75*  --   --   ALKPHOS 451*  --   --   BILITOT 0.8  --   --    ------------------------------------------------------------------------------------------------------------------ No results for input(s): CHOL, HDL, LDLCALC, TRIG, CHOLHDL, LDLDIRECT in the last 72 hours.  Lab Results  Component Value Date   HGBA1C 6.7 (H) 12/18/2017   ------------------------------------------------------------------------------------------------------------------ Recent Labs    02/28/18 1730  TSH 3.106   ------------------------------------------------------------------------------------------------------------------ Recent Labs    03/01/18 1101  VITAMINB12 >7,500*  FOLATE 20.7  FERRITIN 710*  TIBC 216*  IRON 69  RETICCTPCT 3.2*    Coagulation profile No results for input(s): INR, PROTIME in the last 168 hours.  No results for input(s): DDIMER in the last 72 hours.  Cardiac Enzymes No results for input(s): CKMB, TROPONINI, MYOGLOBIN in the last 168 hours.  Invalid input(s): CK ------------------------------------------------------------------------------------------------------------------ No results found for: BNP  Micro Results Recent Results (from the past 240 hour(s))  Blood culture  (routine x 2)     Status: None (Preliminary result)   Collection Time: 02/28/18  4:13 PM  Result Value Ref Range Status   Specimen Description BLOOD RIGHT WRIST  Final   Special Requests   Final    BOTTLES DRAWN AEROBIC AND ANAEROBIC Blood Culture results may not be optimal due to an excessive volume of blood received in culture bottles   Culture   Final    NO GROWTH < 24 HOURS Performed at [facility], [place] [address] [place], [place] [address]    Report Status PENDING  Incomplete  Blood culture (routine x 2)     Status: None (Preliminary result)   Collection Time: 02/28/18  4:30 PM  Result Value Ref Range Status   Specimen Description BLOOD RIGHT HAND  Final   Special Requests   Final    BOTTLES DRAWN AEROBIC ONLY Blood Culture results may not be optimal due to an inadequate volume of blood received in culture bottles   Culture   Final    NO GROWTH < 24 HOURS Performed at [facility], [place] [address]  [place], [place] [address]    Report Status PENDING  Incomplete  Urine culture     Status: Abnormal   Collection Time: 02/28/18  6:05 PM  Result Value Ref Range Status   Specimen Description URINE, CATHETERIZED  Final   Special Requests   Final    NONE Performed at [facility], [address], [place], [place] [address]    Culture >=100,000 COLONIES/mL YEAST (A)  Final   Report Status 03/02/2018 FINAL  Final  MRSA PCR Screening     Status: None   Collection Time: 02/28/18  9:46 PM  Result Value Ref Range Status   MRSA by PCR NEGATIVE NEGATIVE Final    Comment:        The GeneXpert MRSA Assay (FDA approved for NASAL specimens only), is one component of a comprehensive MRSA colonization surveillance program. It is not intended to diagnose MRSA infection nor to guide or monitor treatment for MRSA infections. Performed at [facility], [place] [address] [place], [place] [address]     Radiology Reports Dg Chest 2 View  Result Date:  02/05/2018 CLINICAL DATA:  Cough, fever EXAM: CHEST - 2 VIEW COMPARISON:  12/17/2017 FINDINGS: Cardiomegaly. No confluent airspace opacities, effusions or edema. No acute bony abnormality. IMPRESSION: Cardiomegaly.  No active disease. Electronically Signed   By: Rolm Baptise M.D.   On: 02/05/2018 12:00   Ct Head Wo Contrast  Result Date: 02/28/2018 CLINICAL DATA:  Blurry vision. EXAM: CT HEAD WITHOUT CONTRAST TECHNIQUE: Contiguous axial images were obtained from the base of the skull through the vertex without intravenous contrast. COMPARISON:  None. FINDINGS: Brain: There is no evidence of acute infarct, intracranial hemorrhage, mass, midline shift, or extra-axial fluid collection. Mild cerebral atrophy is within normal limits for age. Cerebral white matter hypodensities are nonspecific but compatible with mild chronic small vessel ischemic disease. Vascular: Calcified atherosclerosis at the skull base. No hyperdense vessel. Skull: No fracture or focal osseous lesion. Sinuses/Orbits: Paranasal sinuses and mastoid air cells are clear. Unremarkable orbits. Other: None. IMPRESSION: 1. No evidence of acute intracranial abnormality. 2. Mild chronic small vessel ischemic disease. Electronically Signed   By: Logan Bores M.D.   On: 02/28/2018 17:42   US Renal  Result Date: 02/06/2018 CLINICAL DATA:  Acute renal failure EXAM: RENAL / URINARY TRACT ULTRASOUND COMPLETE COMPARISON:  12/17/2017 CT abdomen/pelvis. FINDINGS: Examination limited by patient body habitus. Right Kidney: Renal measurements: 9.6 x 4.4 x 5.6 cm = volume: 123 mL . Echogenicity within normal limits. No mass or hydronephrosis visualized. Incidentally noted diffuse hepatic steatosis in the visualized right liver lobe. Left Kidney: Renal measurements: 9.5 x 4.5 x 4.6 cm = volume: 102 mL. Echogenicity within normal limits. No mass or hydronephrosis visualized. Bladder: Appears normal for degree of bladder distention. IMPRESSION: Normal ultrasound of  the kidneys and bladder. No hydronephrosis. Incidental diffuse hepatic steatosis. Electronically Signed   By: Ilona Sorrel M.D.   On: 02/06/2018 00:24   Dg Chest Portable 1 View  Result Date: 02/28/2018 CLINICAL DATA:  Hyperglycemia EXAM: PORTABLE CHEST 1 VIEW COMPARISON:  02/05/2018 FINDINGS: Mild cardiomegaly. No focal consolidation or effusion. Aortic atherosclerosis. No pneumothorax. IMPRESSION: No active disease.  Mild cardiomegaly Electronically Signed   By: Donavan Foil M.D.   On: 02/28/2018 16:24   Dg Abd Portable 1v  Result Date: 03/01/2018 CLINICAL DATA:  Nausea vomiting. EXAM: PORTABLE ABDOMEN - 1 VIEW COMPARISON:  02/05/2018. FINDINGS: Soft tissue structures are unremarkable. Air-filled loops of small and large  bowel noted. Adynamic ileus could present this fashion. Pelvic calcifications consistent phleboliths. Degenerative changes scoliosis lumbar spine. Degenerative changes both hips. IMPRESSION: Air-filled loops of small and large bowel noted consistent with adynamic ileus. Electronically Signed   By: Marcello Moores  Register   On: 03/01/2018 13:02    Time Spent in minutes  30   Lala Lund M.D on 03/02/2018 at 9:13 AM  To page go to [email or web address] - password [email or web address]

## 2018-03-03 LAB — BASIC METABOLIC PANEL
Anion gap: 12 (ref 5–15)
BUN: 41 mg/dL — ABNORMAL HIGH (ref 8–23)
CO2: 20 mmol/L — ABNORMAL LOW (ref 22–32)
Calcium: 9 mg/dL (ref 8.9–10.3)
Chloride: 111 mmol/L (ref 98–111)
Creatinine, Ser: 1.33 mg/dL — ABNORMAL HIGH (ref 0.44–1.00)
GFR calc Af Amer: 47 mL/min — ABNORMAL LOW (ref >60)
GFR calc non Af Amer: 41 mL/min — ABNORMAL LOW (ref >60)
Glucose, Bld: 200 mg/dL — ABNORMAL HIGH (ref 70–99)
Potassium: 3.9 mmol/L (ref 3.5–5.1)
Sodium: 143 mmol/L (ref 135–145)

## 2018-03-03 LAB — GLUCOSE, CAPILLARY
Glucose-Capillary: 136 mg/dL — ABNORMAL HIGH (ref 70–99)
Glucose-Capillary: 78 mg/dL (ref 70–99)
Glucose-Capillary: 156 mg/dL — ABNORMAL HIGH (ref 70–99)
Glucose-Capillary: 74 mg/dL (ref 70–99)

## 2018-03-03 LAB — BRAIN NATRIURETIC PEPTIDE: B Natriuretic Peptide: 174.8 pg/mL — ABNORMAL HIGH (ref 0.0–100.0)

## 2018-03-03 MED ORDER — NITROGLYCERIN 2 % TD OINT
0.5000 [in_us] | TOPICAL_OINTMENT | Freq: Four times a day (QID) | TRANSDERMAL | Status: AC
  Administered 2018-03-03 (×2): 0.5 [in_us] via TOPICAL
  Filled 2018-03-03: qty 30

## 2018-03-03 MED ORDER — FUROSEMIDE 10 MG/ML IJ SOLN
80.0000 mg | Freq: Once | INTRAMUSCULAR | Status: DC
  Filled 2018-03-03: qty 8

## 2018-03-03 MED ORDER — FUROSEMIDE 10 MG/ML IJ SOLN
60.0000 mg | Freq: Once | INTRAMUSCULAR | Status: DC
  Filled 2018-03-03: qty 6

## 2018-03-03 MED ORDER — FUROSEMIDE 10 MG/ML IJ SOLN
80.0000 mg | Freq: Once | INTRAMUSCULAR | Status: AC
  Administered 2018-03-03: 80 mg via INTRAVENOUS
  Filled 2018-03-03: qty 8

## 2018-03-03 NOTE — Progress Notes (Signed)
Pt has a new onset of respiratory wheezing from bilateral right and left upper lung . Doc notify, IV 80 mg Lasix give, pt set up in the chair; Duoneb treatment given by respiratory team. Pt shows no signs of distress at this time. Will continue to monitor.

## 2018-03-03 NOTE — Progress Notes (Signed)
Had to change patient and bedding she soiled it with urine.  Did bladder scan after she soiled her bed.  It was less than 150ml.  She is saying there is no pain in her belly right now.

## 2018-03-03 NOTE — Progress Notes (Signed)
PROGRESS NOTE                                                                                                                                                                                                             Patient Demographics:    Heather Petty, is a 68 y.o. female, DOB - 1950/03/25, DHR:[id number]  Admit date - 02/28/2018   Admitting Physician Lenore Cordia, MD  Outpatient Primary MD for the patient is [name], MD  LOS - 3  Chief Complaint  Patient presents with  . Abnormal labs  . Hyperglycemia       Brief Narrative Heather Petty is a 68 y.o. female with medical history significant for IDT2DM, hypertension, hyperlipidemia, asthma, anemia chronic disease and B12 deficiency, spongiotic dermatitis, morbid obesity, chronic pain, chronic sacral pressure ulcer, and depression/schizophrenia who presents the ED from her SNF due to labs showing acute kidney injury and hyperglycemia.  Patient had recent admission at [facility] from 02/05/2018-02/08/2018 for AKI and dermatitis subsequently transferred to [facility] for dermatology evaluation with eventual discharge on 02/21/2018.  Her renal function improved with less creatinine 0.79 on 02/21/2018.  Her HCTZ was discontinued and her Lasix was decreased to 20 mg p.o. daily.  Her home benazepril 20 mg was continued.  Her rash was found to be spongiotic dermatitis and felt related to nutritional deficiency by dermatology.   Subjective:   Patient in bed, appears comfortable, denies any headache, no fever, no chest pain or pressure, no shortness of breath , no abdominal pain. No focal weakness.    Assessment  & Plan :     1.  DM type II in poor control.  Patient dietary compliance at the facility as with minimal adjustment she became hypoglycemic on 03/02/2018, have adjusted Lantus and sliding scale further, have stopped pre-meal NovoLog as her CBGs were dropping.  Monitor and adjust.   Question her dietary compliance or compliance with home dose insulin at the SNF.  Lab Results  Component Value Date   HGBA1C 6.7 (H) 12/18/2017   CBG (last 3)  Recent Labs    03/02/18 2143 03/02/18 2204 03/03/18 0751  GLUCAP 52* 77 136*    2. Recent spongiotic dermatitis and felt related to nutritional deficiency by dermatologist at [facility] - resumed steroid creams and nutritional supplements.  3.  ARF.  preRenal resolved with hydration.  4.  Hyperkalemia.  resolved after Kayexalate.  5.  Essential hypertension.  Currently blood pressure stable monitor.  For now stay off of hypertensive medications.  6. Chronic sacral decubitus ulcer.  Kindly see wound care consult notes for details.  Present on admission.  7.  Weakness and deconditioning.  Initiated PT.  Patient states she is virtually bedbound at this time.  8.  Anemia of chronic disease.  Stable anemia panel, B12 levels are if anything high in the setting of B12 deficiency, monitor, no need for transfusion currently.  I expect further fall with ongoing hydration.  No signs of GI bleed.  9.  Chronic pain.  Supportive care.  10.  History of depression and schizophrenia.  Continue home medications which include Risperdal and doxepin.  11.  Dyslipidemia.  On statin continue.  12.  Hypothermia upon admission.  No signs of sepsis, this was likely due to dehydration and exposure to elements.  Resolved after supportive care.  13.  Mild wheezing on 03/03/2018.  Likely due to mild fluid overload.  Lasix x1, hold further IV fluids and monitor.  One-time breathing treatment also provided.  She is not complaining of any shortness of breath.    Family Communication  :  None  Code Status :  Full  Disposition Plan  :  TBD  Consults  :  None  Procedures  :    DVT Prophylaxis  :   Heparin   Lab Results  Component Value Date   PLT 204 03/02/2018    Diet :  Diet Order            Diet heart healthy/carb modified Room service  appropriate? Yes; Fluid consistency: Thin; Fluid restriction: 1500 mL Fluid  Diet effective now               Inpatient Medications Scheduled Meds: . B-complex with vitamin C  1 tablet Oral Daily  . bisacodyl  5 mg Oral QHS  . collagenase  1 application Topical Daily  . cyanocobalamin  1,000 mcg Intramuscular Weekly  . docusate sodium  200 mg Oral BID  . doxepin  25 mg Oral BID  . famotidine  20 mg Oral QHS  . folic acid  1 mg Oral Daily  . furosemide  80 mg Intravenous Once  . heparin  5,000 Units Subcutaneous Q8H  . hydrocerin  1 application Topical BID  . hydrocortisone cream  1 application Topical BID  . hydrOXYzine  12.5 mg Oral TID  . insulin aspart  0-20 Units Subcutaneous TID WC  . insulin glargine  20 Units Subcutaneous BID  . nitroGLYCERIN  0.5 inch Topical Q6H  . pravastatin  40 mg Oral Daily  . risperiDONE  0.5 mg Oral BID  . vitamin B-6  25 mg Oral Daily  . vitamin C  500 mg Oral BID  . [START ON 03/04/2018] Vitamin D (Ergocalciferol)  50,000 Units Oral Q Mon  . zinc sulfate  220 mg Oral Daily   Continuous Infusions:  PRN Meds:.acetaminophen **OR** [DISCONTINUED] acetaminophen, ipratropium-albuterol, triamcinolone cream  Antibiotics  :   Anti-infectives (From admission, onward)   None          Objective:   Vitals:   03/02/18 1051 03/02/18 2036 03/03/18 0452 03/03/18 0835  BP:  (!) 140/56 136/62 (!) 114/58  Pulse:  (!) 118 (!) 108 (!) 106  Resp:  17 18 (!) 26  Temp:   98.4 F (36.9 C) 98.2 F (36.8 C)  TempSrc:   Oral Oral  SpO2: 95% 96% 97% 98%  Weight:   120.4 kg   Height:  Wt Readings from Last 3 Encounters:  03/03/18 120.4 kg  02/07/18 (!) 136.1 kg  12/17/17 (!) 136.1 kg     Intake/Output Summary (Last 24 hours) at 03/03/2018 0858 Last data filed at 03/02/2018 2335 Gross per 24 hour  Intake 180 ml  Output 2200 ml  Net -2020 ml     Physical Exam  Awake Alert,  No new F.N deficits, Normal affect [place].AT,PERRAL Supple  Neck,No JVD, No cervical lymphadenopathy appriciated.  Symmetrical Chest wall movement, Good air movement bilaterally, +ve wheezing RRR,No Gallops, Rubs or new Murmurs, No Parasternal Heave +ve B.Sounds, Abd Soft, No tenderness, No organomegaly appriciated, No rebound - guarding or rigidity. No Cyanosis, Clubbing or edema,  stable dry exfoliative old diffuse rash     Data Review:    CBC Recent Labs  Lab 02/28/18 1612 03/01/18 0456 03/02/18 0313  WBC 5.4 5.4 5.8  HGB 8.2* 7.7* 7.7*  HCT 27.0* 25.3* 24.9*  PLT 170 199 204  MCV 101.5* 100.8* 101.2*  MCH 30.8 30.7 31.3  MCHC 30.4 30.4 30.9  RDW 16.4* 16.0* 16.8*  LYMPHSABS 0.5*  --   --   MONOABS 0.2  --   --   EOSABS 0.0  --   --   BASOSABS 0.0  --   --     Chemistries  Recent Labs  Lab 02/28/18 1612 03/01/18 0456 03/02/18 0313 03/03/18 0406  NA 131* 134* 141 143  K 4.8 5.3* 4.3 3.9  CL 100 107 113* 111  CO2 17* 16* 18* 20*  GLUCOSE 453* 432* 46* 200*  BUN 72* 71* 68* 41*  CREATININE 4.41* 3.61* 2.55* 1.33*  CALCIUM 8.9 8.4* 8.5* 9.0  MG  --   --  2.3  --   AST 26  --   --   --   ALT 75*  --   --   --   ALKPHOS 451*  --   --   --   BILITOT 0.8  --   --   --    ------------------------------------------------------------------------------------------------------------------ No results for input(s): CHOL, HDL, LDLCALC, TRIG, CHOLHDL, LDLDIRECT in the last 72 hours.  Lab Results  Component Value Date   HGBA1C 6.7 (H) 12/18/2017   ------------------------------------------------------------------------------------------------------------------ Recent Labs    02/28/18 1730  TSH 3.106   ------------------------------------------------------------------------------------------------------------------ Recent Labs    03/01/18 1101  VITAMINB12 >7,500*  FOLATE 20.7  FERRITIN 710*  TIBC 216*  IRON 69  RETICCTPCT 3.2*    Coagulation profile No results for input(s): INR, PROTIME in the last 168  hours.  No results for input(s): DDIMER in the last 72 hours.  Cardiac Enzymes No results for input(s): CKMB, TROPONINI, MYOGLOBIN in the last 168 hours.  Invalid input(s): CK ------------------------------------------------------------------------------------------------------------------ No results found for: BNP  Micro Results Recent Results (from the past 240 hour(s))  Blood culture (routine x 2)     Status: None (Preliminary result)   Collection Time: 02/28/18  4:13 PM  Result Value Ref Range Status   Specimen Description BLOOD RIGHT WRIST  Final   Special Requests   Final    BOTTLES DRAWN AEROBIC AND ANAEROBIC Blood Culture results may not be optimal due to an excessive volume of blood received in culture bottles   Culture   Final    NO GROWTH 2 DAYS Performed at [facility], [place] [address], [place], [place] [address]    Report Status PENDING  Incomplete  Blood culture (routine x 2)     Status: None (Preliminary result)  Collection Time: 02/28/18  4:30 PM  Result Value Ref Range Status   Specimen Description BLOOD RIGHT HAND  Final   Special Requests   Final    BOTTLES DRAWN AEROBIC ONLY Blood Culture results may not be optimal due to an inadequate volume of blood received in culture bottles   Culture   Final    NO GROWTH 2 DAYS Performed at [place] [address], [place], [place] [address]    Report Status PENDING  Incomplete  Urine culture     Status: Abnormal   Collection Time: 02/28/18  6:05 PM  Result Value Ref Range Status   Specimen Description URINE, CATHETERIZED  Final   Special Requests   Final    NONE Performed at [facility], [place] [address], [place], [place] [address]    Culture >=100,000 COLONIES/mL YEAST (A)  Final   Report Status 03/02/2018 FINAL  Final  MRSA PCR Screening     Status: None   Collection Time: 02/28/18  9:46 PM  Result Value Ref Range Status   MRSA by PCR NEGATIVE NEGATIVE Final    Comment:        The  GeneXpert MRSA Assay (FDA approved for NASAL specimens only), is one component of a comprehensive MRSA colonization surveillance program. It is not intended to diagnose MRSA infection nor to guide or monitor treatment for MRSA infections. Performed at [facility], [place] [address], [place], [place] [address]     Radiology Reports Dg Chest 2 View  Result Date: 02/05/2018 CLINICAL DATA:  Cough, fever EXAM: CHEST - 2 VIEW COMPARISON:  12/17/2017 FINDINGS: Cardiomegaly. No confluent airspace opacities, effusions or edema. No acute bony abnormality. IMPRESSION: Cardiomegaly.  No active disease. Electronically Signed   By: Rolm Baptise M.D.   On: 02/05/2018 12:00   Ct Head Wo Contrast  Result Date: 02/28/2018 CLINICAL DATA:  Blurry vision. EXAM: CT HEAD WITHOUT CONTRAST TECHNIQUE: Contiguous axial images were obtained from the base of the skull through the vertex without intravenous contrast. COMPARISON:  None. FINDINGS: Brain: There is no evidence of acute infarct, intracranial hemorrhage, mass, midline shift, or extra-axial fluid collection. Mild cerebral atrophy is within normal limits for age. Cerebral white matter hypodensities are nonspecific but compatible with mild chronic small vessel ischemic disease. Vascular: Calcified atherosclerosis at the skull base. No hyperdense vessel. Skull: No fracture or focal osseous lesion. Sinuses/Orbits: Paranasal sinuses and mastoid air cells are clear. Unremarkable orbits. Other: None. IMPRESSION: 1. No evidence of acute intracranial abnormality. 2. Mild chronic small vessel ischemic disease. Electronically Signed   By: Logan Bores M.D.   On: 02/28/2018 17:42   US Renal  Result Date: 02/06/2018 CLINICAL DATA:  Acute renal failure EXAM: RENAL / URINARY TRACT ULTRASOUND COMPLETE COMPARISON:  12/17/2017 CT abdomen/pelvis. FINDINGS: Examination limited by patient body habitus. Right Kidney: Renal measurements: 9.6 x 4.4 x 5.6 cm = volume: 123 mL .  Echogenicity within normal limits. No mass or hydronephrosis visualized. Incidentally noted diffuse hepatic steatosis in the visualized right liver lobe. Left Kidney: Renal measurements: 9.5 x 4.5 x 4.6 cm = volume: 102 mL. Echogenicity within normal limits. No mass or hydronephrosis visualized. Bladder: Appears normal for degree of bladder distention. IMPRESSION: Normal ultrasound of the kidneys and bladder. No hydronephrosis. Incidental diffuse hepatic steatosis. Electronically Signed   By: Ilona Sorrel M.D.   On: 02/06/2018 00:24   Dg Chest Portable 1 View  Result Date: 02/28/2018 CLINICAL DATA:  Hyperglycemia EXAM:  PORTABLE CHEST 1 VIEW COMPARISON:  02/05/2018 FINDINGS: Mild cardiomegaly. No focal consolidation or effusion. Aortic atherosclerosis. No pneumothorax. IMPRESSION: No active disease.  Mild cardiomegaly Electronically Signed   By: Donavan Foil M.D.   On: 02/28/2018 16:24   Dg Abd Portable 1v  Result Date: 03/01/2018 CLINICAL DATA:  Nausea vomiting. EXAM: PORTABLE ABDOMEN - 1 VIEW COMPARISON:  02/05/2018. FINDINGS: Soft tissue structures are unremarkable. Air-filled loops of small and large bowel noted. Adynamic ileus could present this fashion. Pelvic calcifications consistent phleboliths. Degenerative changes scoliosis lumbar spine. Degenerative changes both hips. IMPRESSION: Air-filled loops of small and large bowel noted consistent with adynamic ileus. Electronically Signed   By: Marcello Moores  Register   On: 03/01/2018 13:02    Time Spent in minutes  30   Lala Lund M.D on 03/03/2018 at 8:58 AM  To page go to [email or web address] - password [facility]

## 2018-03-04 ENCOUNTER — Inpatient Hospital Stay (HOSPITAL_COMMUNITY): Payer: Medicare Other

## 2018-03-04 LAB — URINALYSIS, ROUTINE W REFLEX MICROSCOPIC
Bilirubin Urine: NEGATIVE
Glucose, UA: NEGATIVE mg/dL
Hgb urine dipstick: NEGATIVE
KETONES UR: NEGATIVE mg/dL
Nitrite: NEGATIVE
Protein, ur: NEGATIVE mg/dL
Specific Gravity, Urine: 1.005 (ref 1.005–1.030)
pH: 7 (ref 5.0–8.0)

## 2018-03-04 LAB — GLUCOSE, CAPILLARY
Glucose-Capillary: 34 mg/dL — CL (ref 70–99)
Glucose-Capillary: 70 mg/dL (ref 70–99)
Glucose-Capillary: 84 mg/dL (ref 70–99)
Glucose-Capillary: 119 mg/dL — ABNORMAL HIGH (ref 70–99)
Glucose-Capillary: 103 mg/dL — ABNORMAL HIGH (ref 70–99)
Glucose-Capillary: 140 mg/dL — ABNORMAL HIGH (ref 70–99)

## 2018-03-04 LAB — LIPASE, BLOOD: Lipase: 19 U/L (ref 11–51)

## 2018-03-04 MED ORDER — FUROSEMIDE 10 MG/ML IJ SOLN
80.0000 mg | Freq: Once | INTRAMUSCULAR | Status: DC
  Filled 2018-03-04: qty 8

## 2018-03-04 MED ORDER — IPRATROPIUM-ALBUTEROL 0.5-2.5 (3) MG/3ML IN SOLN
3.0000 mL | Freq: Once | RESPIRATORY_TRACT | Status: AC
  Administered 2018-03-04: 3 mL via RESPIRATORY_TRACT
  Filled 2018-03-04: qty 3

## 2018-03-04 MED ORDER — PANTOPRAZOLE SODIUM 40 MG PO TBEC
40.0000 mg | DELAYED_RELEASE_TABLET | Freq: Every day | ORAL | Status: DC
  Administered 2018-03-04 – 2018-03-05 (×2): 40 mg via ORAL
  Filled 2018-03-04 (×2): qty 1

## 2018-03-04 MED ORDER — ALUM & MAG HYDROXIDE-SIMETH 200-200-20 MG/5ML PO SUSP
30.0000 mL | Freq: Two times a day (BID) | ORAL | Status: AC
  Administered 2018-03-04 – 2018-03-05 (×3): 30 mL via ORAL
  Filled 2018-03-04 (×3): qty 30

## 2018-03-04 MED ORDER — DEXTROSE 50 % IV SOLN
INTRAVENOUS | Status: AC
  Administered 2018-03-04: 50 mL
  Filled 2018-03-04: qty 50

## 2018-03-04 MED ORDER — FUROSEMIDE 10 MG/ML IJ SOLN
80.0000 mg | Freq: Once | INTRAMUSCULAR | Status: AC
  Administered 2018-03-04: 80 mg via INTRAVENOUS
  Filled 2018-03-04: qty 8

## 2018-03-04 NOTE — Progress Notes (Signed)
Inpatient Diabetes Program Recommendations  AACE/ADA: New Consensus Statement on Inpatient Glycemic Control (2015)  Target Ranges:  Prepandial:   less than 140 mg/dL      Peak postprandial:   less than 180 mg/dL (1-2 hours)      Critically ill patients:  140 - 180 mg/dL   Lab Results  Component Value Date   GLUCAP 119 (H) 03/04/2018   HGBA1C 6.7 (H) 12/18/2017    Review of Glycemic Control Results for Heather Petty, Heather Petty (MRN [id number]) as of 03/04/2018 15:32  Ref. Range 03/04/2018 08:13 03/04/2018 08:14 03/04/2018 08:31 03/04/2018 09:04  Glucose-Capillary Latest Ref Range: 70 - 99 mg/dL 37 (LL) 34 (LL) 70 84   Diabetes history:Type 2 DM Outpatient Diabetes medications:Lantus 25 units QHS, Glipizide 10 mg BID, Actos 30 mg QD Current orders for Inpatient glycemic control:Novolog 0-20 units TID  Inpatient Diabetes Program Recommendations:  Noted severe hypoglycemia of 34 mg/dL this AM and thus discontinuation of BID dosing of basal insulin.  Recommend decreasing correction to Novolog 0-9 units TID. In the event AM FSBS >180 mg/dL, consider restarting Lantus at 12 units QHS.  Also of note, patient received QHS dose of Lantus 20 units and CBG was 76 mg/dL, therefore, dose should have been held.   Thanks, Bronson Curb, MSN, RNC-OB Diabetes Coordinator [phone] (8a-5p)

## 2018-03-04 NOTE — NC FL2 (Signed)
[place] LEVEL OF CARE SCREENING TOOL     IDENTIFICATION  Patient Name: Heather Petty Birthdate: 02-21-1950 Sex: female Admission Date (Current Location): 02/28/2018  [facility] [place] and [place] Number:  Herbalist and Address:  [facility], [place] [address], [place], [place] [address]      Provider Number: [id number]  Attending Physician Name and Address:  Thurnell Lose, MD  Relative Name and Phone Number:  Gerome Sam [phone]    Current Level of Care: Hospital Recommended Level of Care: [place] Prior Approval Number:    Date Approved/Denied:   PASRR Number: [id number] F End date 05/19/18  Discharge Plan: SNF    Current Diagnoses: Patient Active Problem List   Diagnosis Date Noted  . Acute kidney injury ([place]) 02/28/2018  . Wound of sacral region 02/28/2018  . Hypothermia 02/28/2018  . Pressure injury of skin 02/06/2018  . Acute renal failure (ARF) ([place]) 02/05/2018  . Hyperlipidemia associated with type 2 diabetes mellitus ([place]) 02/05/2018  . Hyperkalemia 02/05/2018  . Dermatitis 02/05/2018  . Open back wound 02/05/2018  . Dysuria 02/05/2018  . Paranoid schizophrenia ([place])   . CAP (community acquired pneumonia) 12/17/2017  . AKI (acute kidney injury) ([place]) 12/17/2017  . DM2 (diabetes mellitus, type 2) ([place]) 12/17/2017  . HTN (hypertension) 12/17/2017  . Gout     Orientation RESPIRATION BLADDER Height & Weight     Self, Situation, Place  O2(Nasal cannula 2L) Incontinent Weight: 120.4 kg Height:  5\' 1"  (154.9 cm)  BEHAVIORAL SYMPTOMS/MOOD NEUROLOGICAL BOWEL NUTRITION STATUS      Continent Diet(Please see DC Summary)  AMBULATORY STATUS COMMUNICATION OF NEEDS Skin   Limited Assist Verbally PU Stage and Appropriate Care(Stage II on buttocks;sacrum;back;knee;leg)                       Personal Care Assistance Level of Assistance  Bathing, Feeding, Dressing Bathing Assistance: Limited  assistance Feeding assistance: Independent Dressing Assistance: Limited assistance     Functional Limitations Info  Sight, Hearing, Speech Sight Info: Adequate Hearing Info: Adequate Speech Info: Adequate    SPECIAL CARE FACTORS FREQUENCY  PT (By licensed PT), OT (By licensed OT)     PT Frequency: 5x/week OT Frequency: 3x/week            Contractures Contractures Info: Not present    Additional Factors Info  Code Status, Allergies, Insulin Sliding Scale Code Status Info: Full Allergies Info: Peanut-containing Drug Products   Insulin Sliding Scale Info: 3x daily with meals       Current Medications (03/04/2018):  This is the current hospital active medication list Current Facility-Administered Medications  Medication Dose Route Frequency Provider Last Rate Last Dose  . acetaminophen (TYLENOL) tablet 650 mg  650 mg Oral Q6H PRN Lenore Cordia, MD   650 mg at 03/03/18 0410  . alum & mag hydroxide-simeth (MAALOX/MYLANTA) 200-200-20 MG/5ML suspension 30 mL  30 mL Oral BID Thurnell Lose, MD      . B-complex with vitamin C tablet 1 tablet  1 tablet Oral Daily Lenore Cordia, MD   1 tablet at 03/04/18 0906  . bisacodyl (DULCOLAX) EC tablet 5 mg  5 mg Oral QHS Thurnell Lose, MD   5 mg at 03/03/18 2234  . collagenase (SANTYL) ointment 1 application  1 application Topical Daily Lenore Cordia, MD   1 application at 94/85/46 0908  . cyanocobalamin ((VITAMIN B-12)) injection 1,000 mcg  1,000  mcg Intramuscular Weekly Lenore Cordia, MD   1,000 mcg at 02/28/18 2221  . docusate sodium (COLACE) capsule 200 mg  200 mg Oral BID Thurnell Lose, MD   200 mg at 03/04/18 0905  . doxepin (SINEQUAN) capsule 25 mg  25 mg Oral BID Lenore Cordia, MD   25 mg at 03/04/18 0923  . famotidine (PEPCID) tablet 20 mg  20 mg Oral QHS Thurnell Lose, MD   20 mg at 03/03/18 2232  . folic acid (FOLVITE) tablet 1 mg  1 mg Oral Daily Thurnell Lose, MD   1 mg at 03/04/18 0905  . heparin  injection 5,000 Units  5,000 Units Subcutaneous Q8H Lenore Cordia, MD   5,000 Units at 03/04/18 1314  . hydrocerin (EUCERIN) cream 1 application  1 application Topical BID Lenore Cordia, MD   1 application at 30/07/62 0908  . hydrocortisone cream 1 % 1 application  1 application Topical BID Thurnell Lose, MD   1 application at 26/33/35 0908  . hydrOXYzine (ATARAX/VISTARIL) tablet 12.5 mg  12.5 mg Oral TID Lenore Cordia, MD   12.5 mg at 03/04/18 0906  . insulin aspart (novoLOG) injection 0-20 Units  0-20 Units Subcutaneous TID WC Lenore Cordia, MD   4 Units at 03/03/18 1742  . ipratropium-albuterol (DUONEB) 0.5-2.5 (3) MG/3ML nebulizer solution 3 mL  3 mL Nebulization QID PRN Lenore Cordia, MD   3 mL at 03/03/18 0937  . pantoprazole (PROTONIX) EC tablet 40 mg  40 mg Oral Daily Lala Lund K, MD      . pravastatin (PRAVACHOL) tablet 40 mg  40 mg Oral Daily Lenore Cordia, MD   40 mg at 03/04/18 0905  . risperiDONE (RISPERDAL) tablet 0.5 mg  0.5 mg Oral BID Zada Finders R, MD   0.5 mg at 03/04/18 0905  . triamcinolone cream (KENALOG) 0.1 % 1 application  1 application Topical PRN Thurnell Lose, MD      . vitamin B-6 (pyridOXINE) tablet 25 mg  25 mg Oral Daily Lenore Cordia, MD   25 mg at 03/04/18 0907  . vitamin C (ASCORBIC ACID) tablet 500 mg  500 mg Oral BID Zada Finders R, MD   500 mg at 03/04/18 0905  . Vitamin D (Ergocalciferol) (DRISDOL) capsule 50,000 Units  50,000 Units Oral Q Crista Curb, Cleaster Corin, MD   50,000 Units at 03/04/18 [phone]  . zinc sulfate capsule 220 mg  220 mg Oral Daily Lenore Cordia, MD   220 mg at 03/04/18 5638     Discharge Medications: Please see discharge summary for a list of discharge medications.  Relevant Imaging Results:  Relevant Lab Results:   Additional Information SS# [place], [place]

## 2018-03-04 NOTE — Progress Notes (Signed)
Hypoglycemic Event  CBG: 34 at 0814  Treatment: Dextro 50 % solution 64ml.   Symptoms: sweaty and alert and oriented x4 Follow-up CBG: 70 Time:0831 , CBG 84 time:0904   Comments/MD notified: Doc, notify and insulin is on hold.     Heather Petty A [employer]

## 2018-03-04 NOTE — Progress Notes (Signed)
Pt received 80 mg of lasix by IV, urine output of 720 ML. Lung sounds are clear when auscultated. U/A collected; pt in bed with HOB elevated no signs of respiratory distress or discomfort.

## 2018-03-04 NOTE — Progress Notes (Addendum)
PROGRESS NOTE                                                                                                                                                                                                             Patient Demographics:    Heather Petty, is a 68 y.o. female, DOB - [birth year], ZTI:[id number]  Admit date - 02/28/2018   Admitting Physician Lenore Cordia, MD  Outpatient Primary MD for the patient is [name], MD  LOS - 4  Chief Complaint  Patient presents with  . Abnormal labs  . Hyperglycemia       Brief Narrative Heather Petty is a 68 y.o. female with medical history significant for IDT2DM, hypertension, hyperlipidemia, asthma, anemia chronic disease and B12 deficiency, spongiotic dermatitis, morbid obesity, chronic pain, chronic sacral pressure ulcer, and depression/schizophrenia who presents the ED from her SNF due to labs showing acute kidney injury and hyperglycemia.  Patient had recent admission at [facility] from 02/05/2018-02/08/2018 for AKI and dermatitis subsequently transferred to [facility] for dermatology evaluation with eventual discharge on 02/21/2018.  Her renal function improved with less creatinine 0.79 on 02/21/2018.  Her HCTZ was discontinued and her Lasix was decreased to 20 mg p.o. daily.  Her home benazepril 20 mg was continued.  Her rash was found to be spongiotic dermatitis and felt related to nutritional deficiency by dermatology.   Subjective:   Patient in bed, appears comfortable, denies any headache, no fever, no chest pain or pressure, no shortness of breath , no abdominal pain. No focal weakness.  Does have mild nausea.   Assessment  & Plan :     1.  DM type II in poor control.  Patient dietary compliance at the facility as with minimal adjustment she became hypoglycemic on 03/02/2018, she continues to have mild nausea and poor oral intake, will stop long-acting insulin and monitor her on  sliding scale, KUB unremarkable, will place on PPI for nausea, UA ordered and will check lipase.  Lab Results  Component Value Date   HGBA1C 6.7 (H) 12/18/2017   CBG (last 3)  Recent Labs    03/04/18 0831 03/04/18 0904 03/04/18 1159  GLUCAP 70 84 119*    2. Recent spongiotic dermatitis and felt related to nutritional deficiency by dermatologist at [facility] - resumed steroid creams and nutritional supplements.  3.  ARF.  preRenal resolved with hydration.  4.  Hyperkalemia.  resolved after Kayexalate.  5.  Essential hypertension.  Currently  blood pressure stable monitor.  For now stay off of hypertensive medications.  6. Chronic sacral decubitus ulcer.  Kindly see wound care consult notes for details.  Present on admission.  7.  Weakness and deconditioning.  Initiated PT.  Patient states she is virtually bedbound at this time.  8.  Anemia of chronic disease.  Stable anemia panel, B12 levels are if anything high in the setting of B12 deficiency, monitor, no need for transfusion currently.  I expect further fall with ongoing hydration.  No signs of GI bleed.  9.  Chronic pain.  Supportive care.  10.  History of depression and schizophrenia.  Continue home medications which include Risperdal and doxepin.  11.  Dyslipidemia.  On statin continue.  12.  Hypothermia upon admission.  No signs of sepsis, this was likely due to dehydration and exposure to elements.  Resolved after supportive care.  13.  Mild wheezing on 03/03/2018.  repeat Lasix, breathing treatment, BNP is elevated will check echo.    Family Communication  :  None  Code Status :  Full  Disposition Plan  :  TBD  Consults  :  None  Procedures  :    DVT Prophylaxis  :   Heparin   Lab Results  Component Value Date   PLT 204 03/02/2018    Diet :  Diet Order            Diet heart healthy/carb modified Room service appropriate? Yes; Fluid consistency: Thin; Fluid restriction: 1500 mL Fluid  Diet effective now                Inpatient Medications Scheduled Meds: . alum & mag hydroxide-simeth  30 mL Oral BID  . B-complex with vitamin C  1 tablet Oral Daily  . bisacodyl  5 mg Oral QHS  . collagenase  1 application Topical Daily  . cyanocobalamin  1,000 mcg Intramuscular Weekly  . docusate sodium  200 mg Oral BID  . doxepin  25 mg Oral BID  . famotidine  20 mg Oral QHS  . folic acid  1 mg Oral Daily  . heparin  5,000 Units Subcutaneous Q8H  . hydrocerin  1 application Topical BID  . hydrocortisone cream  1 application Topical BID  . hydrOXYzine  12.5 mg Oral TID  . insulin aspart  0-20 Units Subcutaneous TID WC  . pantoprazole  40 mg Oral Daily  . pravastatin  40 mg Oral Daily  . risperiDONE  0.5 mg Oral BID  . vitamin B-6  25 mg Oral Daily  . vitamin C  500 mg Oral BID  . Vitamin D (Ergocalciferol)  50,000 Units Oral Q Mon  . zinc sulfate  220 mg Oral Daily   Continuous Infusions:  PRN Meds:.acetaminophen **OR** [DISCONTINUED] acetaminophen, ipratropium-albuterol, triamcinolone cream  Antibiotics  :   Anti-infectives (From admission, onward)   None          Objective:   Vitals:   03/03/18 0939 03/03/18 1522 03/03/18 2137 03/04/18 0455  BP:  (!) 105/55 138/73 (!) 120/49  Pulse:  (!) 109 (!) 107 88  Resp:  (!) 28    Temp:  99 F (37.2 C) 98 F (36.7 C) 98.1 F (36.7 C)  TempSrc:  Oral Oral Oral  SpO2: 98% 100% 95% 99%  Weight:      Height:        Wt Readings from Last 3 Encounters:  03/03/18 120.4 kg  02/07/18 (!) 136.1 kg  12/17/17 (!) 136.1  kg     Intake/Output Summary (Last 24 hours) at 03/04/2018 1352 Last data filed at 03/04/2018 3704 Gross per 24 hour  Intake -  Output 3270 ml  Net -3270 ml     Physical Exam  Awake Alert, Oriented X 3, No new F.N deficits, Normal affect [place].AT,PERRAL Supple Neck,No JVD, No cervical lymphadenopathy appriciated.  Symmetrical Chest wall movement, Good air movement bilaterally, +ve wheezing RRR,No Gallops, Rubs or  new Murmurs, No Parasternal Heave +ve B.Sounds, Abd Soft, No tenderness, No organomegaly appriciated, No rebound - guarding or rigidity. No Cyanosis, Clubbing or edema,  stable dry exfoliative old diffuse rash     Data Review:    CBC Recent Labs  Lab 02/28/18 1612 03/01/18 0456 03/02/18 0313  WBC 5.4 5.4 5.8  HGB 8.2* 7.7* 7.7*  HCT 27.0* 25.3* 24.9*  PLT 170 199 204  MCV 101.5* 100.8* 101.2*  MCH 30.8 30.7 31.3  MCHC 30.4 30.4 30.9  RDW 16.4* 16.0* 16.8*  LYMPHSABS 0.5*  --   --   MONOABS 0.2  --   --   EOSABS 0.0  --   --   BASOSABS 0.0  --   --     Chemistries  Recent Labs  Lab 02/28/18 1612 03/01/18 0456 03/02/18 0313 03/03/18 0406  NA 131* 134* 141 143  K 4.8 5.3* 4.3 3.9  CL 100 107 113* 111  CO2 17* 16* 18* 20*  GLUCOSE 453* 432* 46* 200*  BUN 72* 71* 68* 41*  CREATININE 4.41* 3.61* 2.55* 1.33*  CALCIUM 8.9 8.4* 8.5* 9.0  MG  --   --  2.3  --   AST 26  --   --   --   ALT 75*  --   --   --   ALKPHOS 451*  --   --   --   BILITOT 0.8  --   --   --    ------------------------------------------------------------------------------------------------------------------ No results for input(s): CHOL, HDL, LDLCALC, TRIG, CHOLHDL, LDLDIRECT in the last 72 hours.  Lab Results  Component Value Date   HGBA1C 6.7 (H) 12/18/2017   ------------------------------------------------------------------------------------------------------------------ No results for input(s): TSH, T4TOTAL, T3FREE, THYROIDAB in the last 72 hours.  Invalid input(s): FREET3 ------------------------------------------------------------------------------------------------------------------ No results for input(s): VITAMINB12, FOLATE, FERRITIN, TIBC, IRON, RETICCTPCT in the last 72 hours.  Coagulation profile No results for input(s): INR, PROTIME in the last 168 hours.  No results for input(s): DDIMER in the last 72 hours.  Cardiac Enzymes No results for input(s): CKMB, TROPONINI,  MYOGLOBIN in the last 168 hours.  Invalid input(s): CK ------------------------------------------------------------------------------------------------------------------    Component Value Date/Time   BNP 174.8 (H) 03/03/2018 8889    Micro Results Recent Results (from the past 240 hour(s))  Blood culture (routine x 2)     Status: None (Preliminary result)   Collection Time: 02/28/18  4:13 PM  Result Value Ref Range Status   Specimen Description BLOOD RIGHT WRIST  Final   Special Requests   Final    BOTTLES DRAWN AEROBIC AND ANAEROBIC Blood Culture results may not be optimal due to an excessive volume of blood received in culture bottles   Culture   Final    NO GROWTH 4 DAYS Performed at [facility], [place] [address] [place], [place] [address]    Report Status PENDING  Incomplete  Blood culture (routine x 2)     Status: None (Preliminary result)   Collection Time: 02/28/18  4:30 PM  Result Value Ref Range Status  Specimen Description BLOOD RIGHT HAND  Final   Special Requests   Final    BOTTLES DRAWN AEROBIC ONLY Blood Culture results may not be optimal due to an inadequate volume of blood received in culture bottles   Culture   Final    NO GROWTH 4 DAYS Performed at [facility], [place] [address], [place], [place] [address]    Report Status PENDING  Incomplete  Urine culture     Status: Abnormal   Collection Time: 02/28/18  6:05 PM  Result Value Ref Range Status   Specimen Description URINE, CATHETERIZED  Final   Special Requests   Final    NONE Performed at [facility], [place] [address] [place], [place] [address]    Culture >=100,000 COLONIES/mL YEAST (A)  Final   Report Status 03/02/2018 FINAL  Final  MRSA PCR Screening     Status: None   Collection Time: 02/28/18  9:46 PM  Result Value Ref Range Status   MRSA by PCR NEGATIVE NEGATIVE Final    Comment:        The GeneXpert MRSA Assay (FDA approved for NASAL specimens only), is one component of  a comprehensive MRSA colonization surveillance program. It is not intended to diagnose MRSA infection nor to guide or monitor treatment for MRSA infections. Performed at [facility], [place] [address], [place], [place] [address]     Radiology Reports Dg Chest 2 View  Result Date: 02/05/2018 CLINICAL DATA:  Cough, fever EXAM: CHEST - 2 VIEW COMPARISON:  12/17/2017 FINDINGS: Cardiomegaly. No confluent airspace opacities, effusions or edema. No acute bony abnormality. IMPRESSION: Cardiomegaly.  No active disease. Electronically Signed   By: Rolm Baptise M.D.   On: 02/05/2018 12:00   Ct Head Wo Contrast  Result Date: 02/28/2018 CLINICAL DATA:  Blurry vision. EXAM: CT HEAD WITHOUT CONTRAST TECHNIQUE: Contiguous axial images were obtained from the base of the skull through the vertex without intravenous contrast. COMPARISON:  None. FINDINGS: Brain: There is no evidence of acute infarct, intracranial hemorrhage, mass, midline shift, or extra-axial fluid collection. Mild cerebral atrophy is within normal limits for age. Cerebral white matter hypodensities are nonspecific but compatible with mild chronic small vessel ischemic disease. Vascular: Calcified atherosclerosis at the skull base. No hyperdense vessel. Skull: No fracture or focal osseous lesion. Sinuses/Orbits: Paranasal sinuses and mastoid air cells are clear. Unremarkable orbits. Other: None. IMPRESSION: 1. No evidence of acute intracranial abnormality. 2. Mild chronic small vessel ischemic disease. Electronically Signed   By: Logan Bores M.D.   On: 02/28/2018 17:42   US Renal  Result Date: 02/06/2018 CLINICAL DATA:  Acute renal failure EXAM: RENAL / URINARY TRACT ULTRASOUND COMPLETE COMPARISON:  12/17/2017 CT abdomen/pelvis. FINDINGS: Examination limited by patient body habitus. Right Kidney: Renal measurements: 9.6 x 4.4 x 5.6 cm = volume: 123 mL . Echogenicity within normal limits. No mass or hydronephrosis visualized. Incidentally  noted diffuse hepatic steatosis in the visualized right liver lobe. Left Kidney: Renal measurements: 9.5 x 4.5 x 4.6 cm = volume: 102 mL. Echogenicity within normal limits. No mass or hydronephrosis visualized. Bladder: Appears normal for degree of bladder distention. IMPRESSION: Normal ultrasound of the kidneys and bladder. No hydronephrosis. Incidental diffuse hepatic steatosis. Electronically Signed   By: Ilona Sorrel M.D.   On: 02/06/2018 00:24   Dg Chest Portable 1 View  Result Date: 02/28/2018 CLINICAL DATA:  Hyperglycemia EXAM: PORTABLE CHEST 1 VIEW COMPARISON:  02/05/2018 FINDINGS: Mild cardiomegaly. No focal consolidation or  effusion. Aortic atherosclerosis. No pneumothorax. IMPRESSION: No active disease.  Mild cardiomegaly Electronically Signed   By: Donavan Foil M.D.   On: 02/28/2018 16:24   Dg Abd Portable 1v  Result Date: 03/04/2018 CLINICAL DATA:  68 y/o  F; nausea and vomiting. EXAM: PORTABLE ABDOMEN - 1 VIEW COMPARISON:  03/01/2018 abdomen radiograph FINDINGS: The bowel gas pattern is normal. No radio-opaque calculi or other significant radiographic abnormality are seen. Dextrocurvature of the lumbar spine and multilevel degenerative changes. IMPRESSION: Normal bowel gas pattern. Electronically Signed   By: Kristine Garbe M.D.   On: 03/04/2018 13:40   Dg Abd Portable 1v  Result Date: 03/01/2018 CLINICAL DATA:  Nausea vomiting. EXAM: PORTABLE ABDOMEN - 1 VIEW COMPARISON:  02/05/2018. FINDINGS: Soft tissue structures are unremarkable. Air-filled loops of small and large bowel noted. Adynamic ileus could present this fashion. Pelvic calcifications consistent phleboliths. Degenerative changes scoliosis lumbar spine. Degenerative changes both hips. IMPRESSION: Air-filled loops of small and large bowel noted consistent with adynamic ileus. Electronically Signed   By: Marcello Moores  Register   On: 03/01/2018 13:02    Time Spent in minutes  30   Lala Lund M.D on 03/04/2018 at 1:52  PM  To page go to [email or web address] - password [email or web address]

## 2018-03-05 ENCOUNTER — Inpatient Hospital Stay (HOSPITAL_COMMUNITY): Payer: Medicare Other

## 2018-03-05 ENCOUNTER — Other Ambulatory Visit (HOSPITAL_COMMUNITY): Payer: Self-pay

## 2018-03-05 LAB — CBC
HCT: 30.2 % — ABNORMAL LOW (ref 36.0–46.0)
Hemoglobin: 9.3 g/dL — ABNORMAL LOW (ref 12.0–15.0)
MCH: 30.6 pg (ref 26.0–34.0)
MCHC: 30.8 g/dL (ref 30.0–36.0)
MCV: 99.3 fL (ref 80.0–100.0)
Platelets: 195 10*3/uL (ref 150–400)
RBC: 3.04 MIL/uL — ABNORMAL LOW (ref 3.87–5.11)
RDW: 17.2 % — ABNORMAL HIGH (ref 11.5–15.5)
WBC: 8.8 10*3/uL (ref 4.0–10.5)
nRBC: 0.2 % (ref 0.0–0.2)

## 2018-03-05 LAB — BASIC METABOLIC PANEL
Anion gap: 13 (ref 5–15)
BUN: 14 mg/dL (ref 8–23)
CO2: 22 mmol/L (ref 22–32)
Calcium: 8.5 mg/dL — ABNORMAL LOW (ref 8.9–10.3)
Chloride: 105 mmol/L (ref 98–111)
Creatinine, Ser: 1 mg/dL (ref 0.44–1.00)
GFR calc Af Amer: >60 mL/min (ref >60)
GFR calc non Af Amer: 57 mL/min — ABNORMAL LOW (ref >60)
Glucose, Bld: 93 mg/dL (ref 70–99)
Potassium: 4.2 mmol/L (ref 3.5–5.1)
Sodium: 140 mmol/L (ref 135–145)

## 2018-03-05 LAB — MAGNESIUM: Magnesium: 1.4 mg/dL — ABNORMAL LOW (ref 1.7–2.4)

## 2018-03-05 LAB — GLUCOSE, CAPILLARY
Glucose-Capillary: 37 mg/dL — CL (ref 70–99)
Glucose-Capillary: 93 mg/dL (ref 70–99)
Glucose-Capillary: 275 mg/dL — ABNORMAL HIGH (ref 70–99)

## 2018-03-05 LAB — CULTURE, BLOOD (ROUTINE X 2)
Culture: NO GROWTH
Culture: NO GROWTH

## 2018-03-05 MED ORDER — INSULIN ASPART 100 UNIT/ML ~~LOC~~ SOLN: SUBCUTANEOUS | 0 refills | Status: DC

## 2018-03-05 MED ORDER — BENAZEPRIL HCL 10 MG PO TABS: 10.0000 mg | ORAL_TABLET | Freq: Every day | ORAL | Status: DC

## 2018-03-05 MED ORDER — ORAL CARE MOUTH RINSE
15.0000 mL | Freq: Two times a day (BID) | OROMUCOSAL | Status: DC
  Administered 2018-03-05: 15 mL via OROMUCOSAL

## 2018-03-05 MED ORDER — INSULIN GLARGINE 100 UNIT/ML ~~LOC~~ SOLN: 20.0000 [IU] | Freq: Every day | SUBCUTANEOUS | 11 refills | Status: DC

## 2018-03-05 MED ORDER — MAGNESIUM SULFATE 2 GM/50ML IV SOLN
2.0000 g | Freq: Once | INTRAVENOUS | Status: AC
  Administered 2018-03-05: 2 g via INTRAVENOUS
  Filled 2018-03-05: qty 50

## 2018-03-05 NOTE — Progress Notes (Signed)
[facility] will [facility] to: [facility] Anticipated DC date: 03/05/2018 Family notified: Daughter at bedside Transport by: Corey Harold 1:30pm   Per MD patient ready for DC to [facility]. RN, patient, patient's family, and facility notified of DC. Discharge Summary and FL2 sent to facility. RN to call report prior to discharge [phone]). DC packet on chart. Ambulance transport requested for patient.   CSW will sign off for now as social work intervention is no longer needed. Please consult [place] again if new needs arise.  Cedric Fishman, LCSW Clinical Social Worker [phone]

## 2018-03-05 NOTE — Discharge Summary (Signed)
Heather Petty ZJQ:[id number] DOB: 1950-09-23 DOA: 02/28/2018  PCP: Heather Ebbs, MD  Admit date: 02/28/2018  Discharge date: 03/05/2018  Admitted From: SNF   Disposition:  SNF   Recommendations for Outpatient Follow-up:   Follow up with PCP in 1-2 weeks  PCP Please obtain BMP/CBC, 2 view CXR in 1week,  (see Discharge instructions)   PCP Please follow up on the following pending results:    Home Health: None   Equipment/Devices: None  Consultations: None Discharge Condition: Stable   CODE STATUS: Full   Diet Recommendation: Heart Healthy - Low Carb - 1.5lit/day fluid restriction     Chief Complaint  Patient presents with  . Abnormal labs  . Hyperglycemia     Brief history of present illness from the day of admission and additional interim summary    Heather Petty a 68 y.o.femalewith medical history significant forIDT2DM,hypertension, hyperlipidemia, asthma, anemia chronic disease and B12 deficiency, spongiotic dermatitis, morbid obesity, chronic pain, chronic sacral pressure ulcer, and depression/schizophrenia who presents the ED from her SNF due to labs showing acute kidney injuryand hyperglycemia.  Patient had recent admission at [facility] from 02/05/2018-02/08/2018 for AKI and dermatitis subsequently transferred to [facility] for dermatology evaluation with eventual discharge on 02/21/2018. Her renal function improved with less creatinine 0.79 on 02/21/2018. Her HCTZ was discontinued and her Lasix was decreased to 20 mg p.o. daily. Her home benazepril 20 mgwas continued. Her rash was found to be spongiotic dermatitis and felt related to nutritional deficiency by dermatology.                                                                 Hospital Course   1.  DM2 - in poor control. She initially came in with hyperglycemia and was placed on her home dose insulin and subsequently developed persistent hypoglycemia, highly doubt her dietary compliance and compliance with medications at SNF.  She is now on half of her Lantus dose, Glucotrol has been discontinued I have added sliding scale.  She is back to baseline, good appetite, CBGs stable.  Check CBGs QA CHS at SNF strictly and adjust medications as needed.  Lab Results  Component Value Date   HGBA1C 6.7 (H) 12/18/2017   CBG (last 3)  Recent Labs    03/04/18 1629 03/04/18 2116 03/05/18 0837  GLUCAP 103* 140* 93    2. Recent spongiotic dermatitis and felt related to nutritional deficiency by dermatologist at [facility] - resumed steroid creams and nutritional supplements.  3.  ARF.  preRenal resolved with hydration.  4.  Hyperkalemia.  resolved after Kayexalate.  5.  Essential hypertension.  Currently blood pressure stable monitor.  For now stay off of hypertensive medications.  6. Chronic sacral decubitus ulcer.  Kindly see wound care consult notes for details.  Present on admission.  7.  Weakness and deconditioning.  Initiated PT.  Patient states she is virtually bedbound at this time.  8.  Anemia of chronic disease.  Stable anemia panel, B12 levels are if anything high in the setting of B12 deficiency, monitor, no need for transfusion currently.  I expect further fall with ongoing hydration.  No signs of GI bleed.  9.  Chronic pain.  Supportive care.  10.  History of depression and schizophrenia.  Continue home medications which include Risperdal and doxepin.  11.  Dyslipidemia.  On statin continue.  12.  Hypothermia upon admission.  No signs of sepsis, this was likely due to dehydration and exposure to elements.  Resolved after supportive care.  13.  Mild wheezing on 03/03/2018.  repeat Lasix, breathing treatment, BNP is elevated will check echo.  14. Hypomagnesemia -  replaced, recheck at SNF.  Discharge diagnosis     Principal Problem:   Acute kidney injury ([place]) Active Problems:   DM2 (diabetes mellitus, type 2) (HCC)   HTN (hypertension)   Paranoid schizophrenia ([place])   Hyperlipidemia associated with type 2 diabetes mellitus ([place])   Dermatitis   Wound of sacral region   Hypothermia    Discharge instructions    Discharge Instructions    Discharge instructions   Complete by:  As directed    Follow with Primary MD Heather Ebbs, MD in 7 days   Get CBC, CMP  checked  by Primary MD  in 5-7 days    Activity: As tolerated with Full fall precautions use walker/cane & assistance as needed  Disposition SNF  Diet: Heart Healthy Low Carb.  Accuchecks 4 times/day, Once in AM empty stomach and then before each meal. Log in all results and show them to your Prim.MD in 3 days. If any glucose reading is under 80 or above 300 call your Prim MD immidiately. Follow Low glucose instructions for glucose under 80 as instructed.   Special Instructions: If you have smoked or chewed Tobacco  in the last 2 yrs please stop smoking, stop any regular Alcohol  and or any Recreational drug use.  On your next visit with your primary care physician please Get Medicines reviewed and adjusted.  Please request your Prim.MD to go over all Hospital Tests and Procedure/Radiological results at the follow up, please get all Hospital records sent to your Prim MD by signing hospital release before you go home.  If you experience worsening of your admission symptoms, develop shortness of breath, life threatening emergency, suicidal or homicidal thoughts you must seek medical attention immediately by calling 911 or calling your MD immediately  if symptoms less severe.  You Must read complete instructions/literature along with all the possible adverse reactions/side effects for all the Medicines you take and that have been prescribed to you. Take any new Medicines after you  have completely understood and accpet all the possible adverse reactions/side effects.   Increase activity slowly   Complete by:  As directed       Discharge Medications   Allergies as of 03/05/2018      Reactions   Peanut-containing Drug Products Other (See Comments)   Questionable?? May have outgrown it??      Medication List    STOP taking these medications   glipiZIDE 5 MG 24 hr tablet Commonly known as:  GLUCOTROL XL   guaiFENesin 100 MG/5ML liquid Commonly known as:  ROBITUSSIN     TAKE these medications   acetaminophen 325 MG tablet  Commonly known as:  TYLENOL Take 650 mg by mouth 4 (four) times daily as needed (for pain and not to exceed 3,000 mg in a 24-hr period).   allopurinol 300 MG tablet Commonly known as:  ZYLOPRIM Take 300 mg by mouth daily.   amLODipine 10 MG tablet Commonly known as:  NORVASC Take 10 mg by mouth daily.   b complex vitamins tablet Take 1 tablet by mouth daily.   benazepril 10 MG tablet Commonly known as:  LOTENSIN Take 1 tablet (10 mg total) by mouth daily. What changed:    medication strength  how much to take   bisacodyl 5 MG EC tablet Commonly known as:  DULCOLAX Take 1 tablet (5 mg total) by mouth daily as needed for moderate constipation. What changed:  when to take this   cetirizine 10 MG tablet Commonly known as:  ZYRTEC Take 10 mg by mouth daily.   collagenase ointment Commonly known as:  SANTYL Apply topically daily. What changed:    how much to take  when to take this  additional instructions   cyanocobalamin 1000 MCG/ML injection Commonly known as:  (VITAMIN B-12) Inject 1 mL (1,000 mcg total) into the muscle once a week.   doxepin 25 MG capsule Commonly known as:  SINEQUAN Take 25 mg by mouth 2 (two) times daily.   DYMISTA 137-50 MCG/ACT Susp Generic drug:  Azelastine-Fluticasone Place 1 spray into the nose as needed (allergies).   eucerin cream Apply 1 application topically See admin  instructions. Apply to areas of "generalized body for dry/itchy skin" 2 times a day   famotidine 20 MG tablet Commonly known as:  PEPCID Take 20 mg by mouth at bedtime.   folic acid 1 MG tablet Commonly known as:  FOLVITE Take 1 mg by mouth daily.   furosemide 20 MG tablet Commonly known as:  LASIX Take 20 mg by mouth daily.   gabapentin 300 MG capsule Commonly known as:  NEURONTIN Take 300 mg by mouth 3 (three) times daily.   gabapentin 600 MG tablet Commonly known as:  NEURONTIN Take 0.5 tablets (300 mg total) by mouth 3 (three) times daily.   hydrocortisone 2.5 % cream Apply 1 application topically See admin instructions. Apply to rash on face 2 times a day   hydrOXYzine 25 MG tablet Commonly known as:  ATARAX/VISTARIL Take 25 mg by mouth 3 (three) times daily.   insulin aspart 100 UNIT/ML injection Commonly known as:  NOVOLOG Take subcu QA CHS, 140-199 - 2 units, 200-250 - 6 units, 251-299 - 8 units,  300-349 - 10 units,  350 or above 14 units.   insulin glargine 100 UNIT/ML injection Commonly known as:  LANTUS Inject 0.2 mLs (20 Units total) into the skin at bedtime. What changed:  when to take this   ipratropium-albuterol 0.5-2.5 (3) MG/3ML Soln Commonly known as:  DUONEB Take 3 mLs by nebulization 4 (four) times daily as needed (for shortness of breath or wheezing).   nystatin powder Commonly known as:  MYCOSTATIN/NYSTOP Apply topically See admin instructions. Apply between folds, beneath breasts, bilateral axillae, and under pannus   polyethylene glycol packet Commonly known as:  MIRALAX / GLYCOLAX Take 17 g by mouth 2 (two) times daily. What changed:    when to take this  reasons to take this   potassium chloride 10 MEQ tablet Commonly known as:  K-DUR Take 10 mEq by mouth daily.   pravastatin 40 MG tablet Commonly known as:  PRAVACHOL Take 40 mg  by mouth daily.   risperiDONE 0.5 MG tablet Commonly known as:  RISPERDAL Take 1 tablet (0.5 mg  total) by mouth 2 (two) times daily.   silver sulfADIAZINE 1 % cream Commonly known as:  SILVADENE Apply topically 2 (two) times daily.   THERA-M MULTIPLE VITAMINS PO Take 1 tablet by mouth daily.   traMADol 50 MG tablet Commonly known as:  ULTRAM Take 50 mg by mouth 4 (four) times daily as needed (for pain).   triamcinolone 0.147 MG/GM topical spray Commonly known as:  KENALOG Apply 1 spray topically See admin instructions. Apply to head in the direction of hair growth   VENTOLIN HFA 108 (90 Base) MCG/ACT inhaler Generic drug:  albuterol Inhale 2 puffs into the lungs every 4 (four) hours as needed for wheezing or shortness of breath.   vitamin B-6 25 MG tablet Commonly known as:  pyridOXINE Take 25 mg by mouth daily.   vitamin C 500 MG tablet Commonly known as:  ASCORBIC ACID Take 500 mg by mouth 2 (two) times daily.   Vitamin D (Ergocalciferol) 1.25 MG (50000 UT) Caps capsule Commonly known as:  DRISDOL Take 50,000 Units by mouth every Monday.   ZINC-220 PO Take 220 mg by mouth daily.       Follow-up Information    Heather Ebbs, MD. Schedule an appointment as soon as possible for a visit in 1 week(s).   Specialty:  Internal Medicine Why:  and your dermatologist in 1 week Contact information: [place] [address] [phone]           Major procedures and Radiology Reports - PLEASE review detailed and final reports thoroughly  -       Dg Chest 2 View  Result Date: 02/05/2018 CLINICAL DATA:  Cough, fever EXAM: CHEST - 2 VIEW COMPARISON:  12/17/2017 FINDINGS: Cardiomegaly. No confluent airspace opacities, effusions or edema. No acute bony abnormality. IMPRESSION: Cardiomegaly.  No active disease. Electronically Signed   By: Rolm Baptise M.D.   On: 02/05/2018 12:00   Ct Head Wo Contrast  Result Date: 02/28/2018 CLINICAL DATA:  Blurry vision. EXAM: CT HEAD WITHOUT CONTRAST TECHNIQUE: Contiguous axial images were obtained from the  base of the skull through the vertex without intravenous contrast. COMPARISON:  None. FINDINGS: Brain: There is no evidence of acute infarct, intracranial hemorrhage, mass, midline shift, or extra-axial fluid collection. Mild cerebral atrophy is within normal limits for age. Cerebral white matter hypodensities are nonspecific but compatible with mild chronic small vessel ischemic disease. Vascular: Calcified atherosclerosis at the skull base. No hyperdense vessel. Skull: No fracture or focal osseous lesion. Sinuses/Orbits: Paranasal sinuses and mastoid air cells are clear. Unremarkable orbits. Other: None. IMPRESSION: 1. No evidence of acute intracranial abnormality. 2. Mild chronic small vessel ischemic disease. Electronically Signed   By: Logan Bores M.D.   On: 02/28/2018 17:42   US Renal  Result Date: 02/06/2018 CLINICAL DATA:  Acute renal failure EXAM: RENAL / URINARY TRACT ULTRASOUND COMPLETE COMPARISON:  12/17/2017 CT abdomen/pelvis. FINDINGS: Examination limited by patient body habitus. Right Kidney: Renal measurements: 9.6 x 4.4 x 5.6 cm = volume: 123 mL . Echogenicity within normal limits. No mass or hydronephrosis visualized. Incidentally noted diffuse hepatic steatosis in the visualized right liver lobe. Left Kidney: Renal measurements: 9.5 x 4.5 x 4.6 cm = volume: 102 mL. Echogenicity within normal limits. No mass or hydronephrosis visualized. Bladder: Appears normal for degree of bladder distention. IMPRESSION: Normal ultrasound of the kidneys and bladder. No hydronephrosis.  Incidental diffuse hepatic steatosis. Electronically Signed   By: Ilona Sorrel M.D.   On: 02/06/2018 00:24   Dg Chest Portable 1 View  Result Date: 02/28/2018 CLINICAL DATA:  Hyperglycemia EXAM: PORTABLE CHEST 1 VIEW COMPARISON:  02/05/2018 FINDINGS: Mild cardiomegaly. No focal consolidation or effusion. Aortic atherosclerosis. No pneumothorax. IMPRESSION: No active disease.  Mild cardiomegaly Electronically Signed   By: Donavan Foil M.D.   On: 02/28/2018 16:24   Dg Abd Portable 1v  Result Date: 03/04/2018 CLINICAL DATA:  68 y/o  F; nausea and vomiting. EXAM: PORTABLE ABDOMEN - 1 VIEW COMPARISON:  03/01/2018 abdomen radiograph FINDINGS: The bowel gas pattern is normal. No radio-opaque calculi or other significant radiographic abnormality are seen. Dextrocurvature of the lumbar spine and multilevel degenerative changes. IMPRESSION: Normal bowel gas pattern. Electronically Signed   By: Kristine Garbe M.D.   On: 03/04/2018 13:40   Dg Abd Portable 1v  Result Date: 03/01/2018 CLINICAL DATA:  Nausea vomiting. EXAM: PORTABLE ABDOMEN - 1 VIEW COMPARISON:  02/05/2018. FINDINGS: Soft tissue structures are unremarkable. Air-filled loops of small and large bowel noted. Adynamic ileus could present this fashion. Pelvic calcifications consistent phleboliths. Degenerative changes scoliosis lumbar spine. Degenerative changes both hips. IMPRESSION: Air-filled loops of small and large bowel noted consistent with adynamic ileus. Electronically Signed   By: Marcello Moores  Register   On: 03/01/2018 13:02    Micro Results     Recent Results (from the past 240 hour(s))  Blood culture (routine x 2)     Status: None (Preliminary result)   Collection Time: 02/28/18  4:13 PM  Result Value Ref Range Status   Specimen Description BLOOD RIGHT WRIST  Final   Special Requests   Final    BOTTLES DRAWN AEROBIC AND ANAEROBIC Blood Culture results may not be optimal due to an excessive volume of blood received in culture bottles   Culture   Final    NO GROWTH 4 DAYS Performed at [facility], [place] [address] [place], [place] [address]    Report Status PENDING  Incomplete  Blood culture (routine x 2)     Status: None (Preliminary result)   Collection Time: 02/28/18  4:30 PM  Result Value Ref Range Status   Specimen Description BLOOD RIGHT HAND  Final   Special Requests   Final    BOTTLES DRAWN AEROBIC ONLY Blood Culture results may  not be optimal due to an inadequate volume of blood received in culture bottles   Culture   Final    NO GROWTH 4 DAYS Performed at [facility], [place] [address], [place], [place] [address]    Report Status PENDING  Incomplete  Urine culture     Status: Abnormal   Collection Time: 02/28/18  6:05 PM  Result Value Ref Range Status   Specimen Description URINE, CATHETERIZED  Final   Special Requests   Final    NONE Performed at [facility], [place] [address] [place],  [address]    Culture >=100,000 COLONIES/mL YEAST (A)  Final   Report Status 03/02/2018 FINAL  Final  MRSA PCR Screening     Status: None   Collection Time: 02/28/18  9:46 PM  Result Value Ref Range Status   MRSA by PCR NEGATIVE NEGATIVE Final    Comment:        The GeneXpert MRSA Assay (FDA approved for NASAL specimens only), is one component of a comprehensive MRSA colonization surveillance program. It is not intended to diagnose  MRSA infection nor to guide or monitor treatment for MRSA infections. Performed at [facility], [place] [address], [place], [place] [address]     Today   Subjective    Xiadani Damman today has no headache,no chest abdominal pain,no new weakness tingling or numbness, feels much better.   Objective   Blood pressure (!) 145/65, pulse 100, temperature 98.8 F (37.1 C), temperature source Oral, resp. rate 18, height 5' 1"  (1.549 m), weight 121 kg, SpO2 93 %.   Intake/Output Summary (Last 24 hours) at 03/05/2018 0830 Last data filed at 03/05/2018 0655 Gross per 24 hour  Intake 360 ml  Output 1220 ml  Net -860 ml    Exam  Awake Alert, Oriented x 3, No new F.N deficits, Normal affect [place].AT,PERRAL Supple Neck,No JVD, No cervical lymphadenopathy appriciated.  Symmetrical Chest wall movement, Good air movement bilaterally, CTAB RRR,No Gallops,Rubs or new Murmurs, No Parasternal Heave +ve B.Sounds, Abd Soft, Non tender, No organomegaly appriciated, No rebound  -guarding or rigidity. No Cyanosis, Clubbing or edema, No new Rash or bruise   Data Review   CBC w Diff:  Lab Results  Component Value Date   WBC 8.8 03/05/2018   HGB 9.3 (L) 03/05/2018   HCT 30.2 (L) 03/05/2018   HCT 25.3 (L) 12/21/2017   PLT 195 03/05/2018   LYMPHOPCT 9 02/28/2018   MONOPCT 4 02/28/2018   EOSPCT 0 02/28/2018   BASOPCT 0 02/28/2018    CMP:  Lab Results  Component Value Date   NA 140 03/05/2018   K 4.2 03/05/2018   CL 105 03/05/2018   CO2 22 03/05/2018   BUN 14 03/05/2018   CREATININE 1.00 03/05/2018   PROT 5.6 (L) 02/28/2018   ALBUMIN 2.5 (L) 02/28/2018   BILITOT 0.8 02/28/2018   ALKPHOS 451 (H) 02/28/2018   AST 26 02/28/2018   ALT 75 (H) 02/28/2018  .  Lab Results  Component Value Date   HGBA1C 6.7 (H) 12/18/2017   CBG (last 3)  Recent Labs    03/04/18 1159 03/04/18 1629 03/04/18 2116  GLUCAP 119* 103* 140*    Total Time in preparing paper work, data evaluation and todays exam - 62 minutes  Lala Lund M.D on 03/05/2018 at 8:30 AM  [facility]   Office  [phone]

## 2018-03-05 NOTE — Care Management Important Message (Signed)
Important Message  Patient Details  Name: Heather Petty MRN: [id number] Date of Birth: 1950/07/18   Medicare Important Message Given:  Yes    [name] 03/05/2018, 7:23 AM

## 2018-03-05 NOTE — Progress Notes (Signed)
PT Cancellation Note  Patient Details Name: Heather Petty MRN: [id number] DOB: 1950-02-08   Cancelled Treatment:    Reason Eval/Treat Not Completed: Other (comment). To discharge to SNF this afternoon, will defer treatment.   Mabeline Caras, PT, DPT Acute Rehabilitation Services  Pager [phone] Office [place] 03/05/2018, 3:15 PM

## 2018-03-05 NOTE — Discharge Instructions (Signed)
Follow with Primary MD [name], MD in 7 days   Get CBC, CMP  checked  by Primary MD  in 5-7 days    Activity: As tolerated with Full fall precautions use walker/cane & assistance as needed  Disposition SNF  Diet: Heart Healthy Low Carb.  Accuchecks 4 times/day, Once in AM empty stomach and then before each meal. Log in all results and show them to your Prim.MD in 3 days. If any glucose reading is under 80 or above 300 call your Prim MD immidiately. Follow Low glucose instructions for glucose under 80 as instructed.   Special Instructions: If you have smoked or chewed Tobacco  in the last 2 yrs please stop smoking, stop any regular Alcohol  and or any Recreational drug use.  On your next visit with your primary care physician please Get Medicines reviewed and adjusted.  Please request your Prim.MD to go over all Hospital Tests and Procedure/Radiological results at the follow up, please get all Hospital records sent to your Prim MD by signing hospital release before you go home.  If you experience worsening of your admission symptoms, develop shortness of breath, life threatening emergency, suicidal or homicidal thoughts you must seek medical attention immediately by calling 911 or calling your MD immediately  if symptoms less severe.  You Must read complete instructions/literature along with all the possible adverse reactions/side effects for all the Medicines you take and that have been prescribed to you. Take any new Medicines after you have completely understood and accpet all the possible adverse reactions/side effects.

## 2018-03-05 NOTE — Clinical Social Work Note (Signed)
Clinical Social Work Assessment  Patient Details  Name: Heather Petty MRN: [id number] Date of Birth: [birth year]  Date of referral:  03/05/18               Reason for consult:  Facility Placement                Permission sought to share information with:  Facility Sport and exercise psychologist, Family Supports Permission granted to share information::  Yes, Verbal Permission Granted  Name::     Child psychotherapist::  Miquel Dunn  Relationship::  Daughter  Contact Information:  [phone]  Housing/Transportation Living arrangements for the past 2 months:  [place] of Information:  Patient, Adult Children Patient Interpreter Needed:  None Criminal Activity/Legal Involvement Pertinent to Current Situation/Hospitalization:  No - Comment as needed Significant Relationships:  Adult Children Lives with:  Adult Children Do you feel safe going back to the place where you live?  Yes Need for family participation in patient care:  No (Coment)  Care giving concerns:  CSW received consult for possible SNF placement at time of discharge. CSW spoke with patient and daughter. Patient has been at [facility] for rehab and will return at discharge. CSW to continue to follow and assist with discharge planning needs.   Social Worker assessment / plan:  CSW spoke with patient concerning possibility of returning to rehab at [facility] before returning home.  Employment status:  Retired Forensic scientist:  Medicare PT Recommendations:  [place] / Referral to community resources:  [place]  Patient/Family's Response to care:  Patient and daughter report agreement with discharge back to [facility], systems for transport.  Patient/Family's Understanding of and Emotional Response to Diagnosis, Current Treatment, and Prognosis:  Patient/family is realistic regarding therapy needs and expressed being hopeful for return to SNF placement. Patient expressed  understanding of CSW role and discharge process as well as medical condition. No questions/concerns about plan or treatment.    Emotional Assessment Appearance:  Appears stated age Attitude/Demeanor/Rapport:  Engaged Affect (typically observed):  Accepting, Appropriate Orientation:  Oriented to Self, Oriented to Place, Oriented to  Time, Oriented to Situation Alcohol / Substance use:  Not Applicable Psych involvement (Current and /or in the community):  No (Comment)  Discharge Needs  Concerns to be addressed:  Care Coordination Readmission within the last 30 days:  Yes Current discharge risk:  None Barriers to Discharge:  No Barriers Identified   Benard Halsted, LCSW 03/05/2018, 12:25 PM

## 2018-03-05 NOTE — Progress Notes (Signed)
[place] called and attempted to give report. Once transferred to nurse, phone rang and reached voicemail. Will attempt at a later time.

## 2018-03-14 ENCOUNTER — Other Ambulatory Visit: Payer: Self-pay

## 2018-03-14 ENCOUNTER — Encounter (HOSPITAL_COMMUNITY): Payer: Self-pay | Admitting: Emergency Medicine

## 2018-03-14 ENCOUNTER — Inpatient Hospital Stay (HOSPITAL_COMMUNITY)
Admission: EM | Admit: 2018-03-14 | Discharge: 2018-03-20 | DRG: 640 | Disposition: A | Discharge status: Skilled Nursing Facility | Payer: Medicare Other | Source: Skilled Nursing Facility | Attending: Internal Medicine | Admitting: Internal Medicine

## 2018-03-14 ENCOUNTER — Emergency Department (HOSPITAL_COMMUNITY): Payer: Medicare Other

## 2018-03-14 DIAGNOSIS — M109 Gout, unspecified: Secondary | ICD-10-CM | POA: Diagnosis present

## 2018-03-14 DIAGNOSIS — Z87891 Personal history of nicotine dependence: Secondary | ICD-10-CM

## 2018-03-14 DIAGNOSIS — N183 Chronic kidney disease, stage 3 unspecified: Secondary | ICD-10-CM | POA: Diagnosis present

## 2018-03-14 DIAGNOSIS — Z6841 Body Mass Index (BMI) 40.0 and over, adult: Secondary | ICD-10-CM

## 2018-03-14 DIAGNOSIS — J45909 Unspecified asthma, uncomplicated: Secondary | ICD-10-CM

## 2018-03-14 DIAGNOSIS — I129 Hypertensive chronic kidney disease with stage 1 through stage 4 chronic kidney disease, or unspecified chronic kidney disease: Secondary | ICD-10-CM | POA: Diagnosis present

## 2018-03-14 DIAGNOSIS — I10 Essential (primary) hypertension: Secondary | ICD-10-CM

## 2018-03-14 DIAGNOSIS — D539 Nutritional anemia, unspecified: Secondary | ICD-10-CM | POA: Diagnosis present

## 2018-03-14 DIAGNOSIS — D631 Anemia in chronic kidney disease: Secondary | ICD-10-CM | POA: Diagnosis present

## 2018-03-14 DIAGNOSIS — K760 Fatty (change of) liver, not elsewhere classified: Secondary | ICD-10-CM | POA: Diagnosis present

## 2018-03-14 DIAGNOSIS — L89313 Pressure ulcer of right buttock, stage 3: Secondary | ICD-10-CM | POA: Diagnosis present

## 2018-03-14 DIAGNOSIS — R945 Abnormal results of liver function studies: Secondary | ICD-10-CM | POA: Diagnosis present

## 2018-03-14 DIAGNOSIS — R7989 Other specified abnormal findings of blood chemistry: Secondary | ICD-10-CM | POA: Diagnosis present

## 2018-03-14 DIAGNOSIS — N179 Acute kidney failure, unspecified: Secondary | ICD-10-CM | POA: Diagnosis not present

## 2018-03-14 DIAGNOSIS — E872 Acidosis, unspecified: Secondary | ICD-10-CM | POA: Diagnosis present

## 2018-03-14 DIAGNOSIS — I959 Hypotension, unspecified: Secondary | ICD-10-CM | POA: Diagnosis present

## 2018-03-14 DIAGNOSIS — F209 Schizophrenia, unspecified: Secondary | ICD-10-CM | POA: Diagnosis present

## 2018-03-14 DIAGNOSIS — J45901 Unspecified asthma with (acute) exacerbation: Secondary | ICD-10-CM | POA: Diagnosis present

## 2018-03-14 DIAGNOSIS — R0902 Hypoxemia: Secondary | ICD-10-CM | POA: Diagnosis present

## 2018-03-14 DIAGNOSIS — L8990 Pressure ulcer of unspecified site, unspecified stage: Secondary | ICD-10-CM | POA: Diagnosis present

## 2018-03-14 DIAGNOSIS — E1129 Type 2 diabetes mellitus with other diabetic kidney complication: Secondary | ICD-10-CM | POA: Diagnosis present

## 2018-03-14 DIAGNOSIS — E785 Hyperlipidemia, unspecified: Secondary | ICD-10-CM | POA: Diagnosis present

## 2018-03-14 DIAGNOSIS — J4541 Moderate persistent asthma with (acute) exacerbation: Secondary | ICD-10-CM | POA: Diagnosis not present

## 2018-03-14 DIAGNOSIS — L89323 Pressure ulcer of left buttock, stage 3: Secondary | ICD-10-CM | POA: Diagnosis present

## 2018-03-14 DIAGNOSIS — K219 Gastro-esophageal reflux disease without esophagitis: Secondary | ICD-10-CM | POA: Diagnosis present

## 2018-03-14 DIAGNOSIS — E1165 Type 2 diabetes mellitus with hyperglycemia: Secondary | ICD-10-CM | POA: Diagnosis present

## 2018-03-14 DIAGNOSIS — J189 Pneumonia, unspecified organism: Secondary | ICD-10-CM | POA: Diagnosis present

## 2018-03-14 DIAGNOSIS — R062 Wheezing: Secondary | ICD-10-CM

## 2018-03-14 DIAGNOSIS — E875 Hyperkalemia: Principal | ICD-10-CM | POA: Diagnosis present

## 2018-03-14 DIAGNOSIS — E1122 Type 2 diabetes mellitus with diabetic chronic kidney disease: Secondary | ICD-10-CM | POA: Diagnosis present

## 2018-03-14 DIAGNOSIS — J9601 Acute respiratory failure with hypoxia: Secondary | ICD-10-CM

## 2018-03-14 DIAGNOSIS — L89899 Pressure ulcer of other site, unspecified stage: Secondary | ICD-10-CM | POA: Diagnosis present

## 2018-03-14 DIAGNOSIS — E1169 Type 2 diabetes mellitus with other specified complication: Secondary | ICD-10-CM | POA: Diagnosis present

## 2018-03-14 DIAGNOSIS — E860 Dehydration: Secondary | ICD-10-CM | POA: Diagnosis present

## 2018-03-14 LAB — CBC WITH DIFFERENTIAL/PLATELET
Abs Immature Granulocytes: 0.05 10*3/uL (ref 0.00–0.07)
Basophils Absolute: 0 10*3/uL (ref 0.0–0.1)
Basophils Relative: 1 %
Eosinophils Absolute: 0.5 10*3/uL (ref 0.0–0.5)
Eosinophils Relative: 7 %
HCT: 27.6 % — ABNORMAL LOW (ref 36.0–46.0)
HEMOGLOBIN: 8 g/dL — AB (ref 12.0–15.0)
Immature Granulocytes: 1 %
Lymphocytes Relative: 16 %
Lymphs Abs: 1.2 10*3/uL (ref 0.7–4.0)
MCH: 30.7 pg (ref 26.0–34.0)
MCHC: 29 g/dL — ABNORMAL LOW (ref 30.0–36.0)
MCV: 105.7 fL — ABNORMAL HIGH (ref 80.0–100.0)
MONOS PCT: 6 %
Monocytes Absolute: 0.4 10*3/uL (ref 0.1–1.0)
Neutro Abs: 5 10*3/uL (ref 1.7–7.7)
Neutrophils Relative %: 69 %
Platelets: 226 10*3/uL (ref 150–400)
RBC: 2.61 MIL/uL — ABNORMAL LOW (ref 3.87–5.11)
RDW: 17.2 % — ABNORMAL HIGH (ref 11.5–15.5)
WBC: 7.2 10*3/uL (ref 4.0–10.5)
nRBC: 0 % (ref 0.0–0.2)

## 2018-03-14 LAB — COMPREHENSIVE METABOLIC PANEL
ALBUMIN: 2.2 g/dL — AB (ref 3.5–5.0)
ALT: 46 U/L — ABNORMAL HIGH (ref 0–44)
AST: 43 U/L — ABNORMAL HIGH (ref 15–41)
Alkaline Phosphatase: 366 U/L — ABNORMAL HIGH (ref 38–126)
Anion gap: 10 (ref 5–15)
BUN: 70 mg/dL — ABNORMAL HIGH (ref 8–23)
CHLORIDE: 112 mmol/L — AB (ref 98–111)
CO2: 20 mmol/L — ABNORMAL LOW (ref 22–32)
Calcium: 9 mg/dL (ref 8.9–10.3)
Creatinine, Ser: 3 mg/dL — ABNORMAL HIGH (ref 0.44–1.00)
GFR calc Af Amer: 18 mL/min — ABNORMAL LOW (ref >60)
GFR calc non Af Amer: 15 mL/min — ABNORMAL LOW (ref >60)
Glucose, Bld: 223 mg/dL — ABNORMAL HIGH (ref 70–99)
Potassium: 6.2 mmol/L — ABNORMAL HIGH (ref 3.5–5.1)
Sodium: 142 mmol/L (ref 135–145)
Total Bilirubin: 0.6 mg/dL (ref 0.3–1.2)
Total Protein: 5.8 g/dL — ABNORMAL LOW (ref 6.5–8.1)

## 2018-03-14 LAB — URINALYSIS, ROUTINE W REFLEX MICROSCOPIC
Bilirubin Urine: NEGATIVE
Glucose, UA: 50 mg/dL — AB
Hgb urine dipstick: NEGATIVE
Ketones, ur: NEGATIVE mg/dL
Nitrite: NEGATIVE
Protein, ur: NEGATIVE mg/dL
Specific Gravity, Urine: 1.014 (ref 1.005–1.030)
pH: 5 (ref 5.0–8.0)

## 2018-03-14 LAB — INFLUENZA PANEL BY PCR (TYPE A & B)
INFLAPCR: NEGATIVE
Influenza B By PCR: NEGATIVE

## 2018-03-14 LAB — CBG MONITORING, ED
Glucose-Capillary: 243 mg/dL — ABNORMAL HIGH (ref 70–99)
Glucose-Capillary: 262 mg/dL — ABNORMAL HIGH (ref 70–99)

## 2018-03-14 LAB — NA AND K (SODIUM & POTASSIUM), RAND UR
Potassium Urine: 61 mmol/L
Sodium, Ur: 35 mmol/L

## 2018-03-14 LAB — CREATININE, URINE, RANDOM: Creatinine, Urine: 93.55 mg/dL

## 2018-03-14 LAB — LACTIC ACID, PLASMA: LACTIC ACID, VENOUS: 2.2 mmol/L — AB (ref 0.5–1.9)

## 2018-03-14 LAB — OSMOLALITY, URINE: Osmolality, Ur: 419 mOsm/kg (ref 300–900)

## 2018-03-14 LAB — I-STAT TROPONIN, ED: Troponin i, poc: 0.01 ng/mL (ref 0.00–0.08)

## 2018-03-14 LAB — BRAIN NATRIURETIC PEPTIDE: B NATRIURETIC PEPTIDE 5: 48.9 pg/mL (ref 0.0–100.0)

## 2018-03-14 MED ORDER — INSULIN ASPART 100 UNIT/ML IV SOLN
5.0000 [IU] | Freq: Once | INTRAVENOUS | Status: AC
  Administered 2018-03-14: 5 [IU] via INTRAVENOUS

## 2018-03-14 MED ORDER — RISPERIDONE 0.5 MG PO TABS
0.5000 mg | ORAL_TABLET | Freq: Two times a day (BID) | ORAL | Status: DC
  Administered 2018-03-15 – 2018-03-20 (×12): 0.5 mg via ORAL
  Filled 2018-03-14 (×13): qty 1

## 2018-03-14 MED ORDER — GABAPENTIN 300 MG PO CAPS
300.0000 mg | ORAL_CAPSULE | Freq: Three times a day (TID) | ORAL | Status: DC
  Administered 2018-03-15 – 2018-03-20 (×17): 300 mg via ORAL
  Filled 2018-03-14 (×18): qty 1

## 2018-03-14 MED ORDER — TRIAMCINOLONE ACETONIDE 0.1 % EX LOTN: 1.0000 "application " | TOPICAL_LOTION | CUTANEOUS | Status: DC | PRN

## 2018-03-14 MED ORDER — SILVER SULFADIAZINE 1 % EX CREA
TOPICAL_CREAM | Freq: Two times a day (BID) | CUTANEOUS | Status: DC
  Administered 2018-03-15: 02:00:00 via TOPICAL
  Filled 2018-03-14: qty 85

## 2018-03-14 MED ORDER — HYDRALAZINE HCL 20 MG/ML IJ SOLN
5.0000 mg | INTRAMUSCULAR | Status: DC | PRN
  Administered 2018-03-18: 5 mg via INTRAVENOUS
  Filled 2018-03-14: qty 1

## 2018-03-14 MED ORDER — BISACODYL 5 MG PO TBEC: 5.0000 mg | DELAYED_RELEASE_TABLET | Freq: Every day | ORAL | Status: DC | PRN

## 2018-03-14 MED ORDER — TRAMADOL HCL 50 MG PO TABS: 50.0000 mg | ORAL_TABLET | Freq: Four times a day (QID) | ORAL | Status: DC | PRN

## 2018-03-14 MED ORDER — ALLOPURINOL 300 MG PO TABS
300.0000 mg | ORAL_TABLET | Freq: Every day | ORAL | Status: DC
  Administered 2018-03-15 – 2018-03-20 (×6): 300 mg via ORAL
  Filled 2018-03-14 (×6): qty 1

## 2018-03-14 MED ORDER — DOXEPIN HCL 25 MG PO CAPS
25.0000 mg | ORAL_CAPSULE | Freq: Two times a day (BID) | ORAL | Status: DC
  Administered 2018-03-15 – 2018-03-20 (×11): 25 mg via ORAL
  Filled 2018-03-14 (×13): qty 1

## 2018-03-14 MED ORDER — FAMOTIDINE 20 MG PO TABS
20.0000 mg | ORAL_TABLET | Freq: Every day | ORAL | Status: DC
  Administered 2018-03-15 – 2018-03-19 (×6): 20 mg via ORAL
  Filled 2018-03-14 (×6): qty 1

## 2018-03-14 MED ORDER — NYSTATIN 100000 UNIT/GM EX POWD: CUTANEOUS | Status: DC | PRN

## 2018-03-14 MED ORDER — POLYETHYLENE GLYCOL 3350 17 G PO PACK
17.0000 g | PACK | Freq: Every day | ORAL | Status: DC | PRN
  Administered 2018-03-19: 17 g via ORAL
  Filled 2018-03-14: qty 1

## 2018-03-14 MED ORDER — METHYLPREDNISOLONE SODIUM SUCC 125 MG IJ SOLR
125.0000 mg | Freq: Three times a day (TID) | INTRAMUSCULAR | Status: DC
  Administered 2018-03-15 – 2018-03-16 (×4): 125 mg via INTRAVENOUS
  Filled 2018-03-14 (×4): qty 2

## 2018-03-14 MED ORDER — PRAVASTATIN SODIUM 40 MG PO TABS
40.0000 mg | ORAL_TABLET | Freq: Every day | ORAL | Status: DC
  Administered 2018-03-15 – 2018-03-19 (×5): 40 mg via ORAL
  Filled 2018-03-14 (×6): qty 1

## 2018-03-14 MED ORDER — VITAMIN C 500 MG PO TABS
500.0000 mg | ORAL_TABLET | Freq: Two times a day (BID) | ORAL | Status: DC
  Administered 2018-03-15 – 2018-03-20 (×12): 500 mg via ORAL
  Filled 2018-03-14 (×12): qty 1

## 2018-03-14 MED ORDER — DM-GUAIFENESIN ER 30-600 MG PO TB12: 1.0000 | ORAL_TABLET | Freq: Two times a day (BID) | ORAL | Status: DC | PRN

## 2018-03-14 MED ORDER — SODIUM CHLORIDE 0.9 % IV BOLUS
1000.0000 mL | Freq: Once | INTRAVENOUS | Status: AC
  Administered 2018-03-14: 1000 mL via INTRAVENOUS

## 2018-03-14 MED ORDER — SODIUM POLYSTYRENE SULFONATE 15 GM/60ML PO SUSP
30.0000 g | Freq: Once | ORAL | Status: AC
  Administered 2018-03-15: 30 g via ORAL
  Filled 2018-03-14: qty 120

## 2018-03-14 MED ORDER — B COMPLEX-C PO TABS
1.0000 | ORAL_TABLET | Freq: Every day | ORAL | Status: DC
  Administered 2018-03-15 – 2018-03-20 (×6): 1 via ORAL
  Filled 2018-03-14 (×6): qty 1

## 2018-03-14 MED ORDER — SODIUM CHLORIDE 0.9 % IV SOLN
INTRAVENOUS | Status: DC
  Administered 2018-03-15 – 2018-03-17 (×9): via INTRAVENOUS

## 2018-03-14 MED ORDER — ONDANSETRON HCL 4 MG/2ML IJ SOLN: 4.0000 mg | Freq: Four times a day (QID) | INTRAMUSCULAR | Status: DC | PRN

## 2018-03-14 MED ORDER — METHYLPREDNISOLONE SODIUM SUCC 125 MG IJ SOLR
125.0000 mg | Freq: Once | INTRAMUSCULAR | Status: AC
  Administered 2018-03-14: 125 mg via INTRAVENOUS
  Filled 2018-03-14: qty 2

## 2018-03-14 MED ORDER — HYDROXYZINE HCL 25 MG PO TABS
25.0000 mg | ORAL_TABLET | Freq: Three times a day (TID) | ORAL | Status: DC
  Administered 2018-03-15 – 2018-03-20 (×17): 25 mg via ORAL
  Filled 2018-03-14 (×18): qty 1

## 2018-03-14 MED ORDER — IPRATROPIUM BROMIDE 0.02 % IN SOLN: 0.5000 mg | Freq: Four times a day (QID) | RESPIRATORY_TRACT | Status: DC

## 2018-03-14 MED ORDER — VITAMIN B-6 25 MG PO TABS
25.0000 mg | ORAL_TABLET | Freq: Every day | ORAL | Status: DC
  Administered 2018-03-15 – 2018-03-20 (×6): 25 mg via ORAL
  Filled 2018-03-14 (×6): qty 1

## 2018-03-14 MED ORDER — ONDANSETRON HCL 4 MG PO TABS: 4.0000 mg | ORAL_TABLET | Freq: Four times a day (QID) | ORAL | Status: DC | PRN

## 2018-03-14 MED ORDER — ENOXAPARIN SODIUM 40 MG/0.4ML ~~LOC~~ SOLN
40.0000 mg | SUBCUTANEOUS | Status: DC
  Administered 2018-03-15: 40 mg via SUBCUTANEOUS
  Filled 2018-03-14: qty 0.4

## 2018-03-14 MED ORDER — ZINC SULFATE 220 (50 ZN) MG PO CAPS
220.0000 mg | ORAL_CAPSULE | Freq: Every day | ORAL | Status: DC
  Administered 2018-03-15 – 2018-03-20 (×6): 220 mg via ORAL
  Filled 2018-03-14 (×5): qty 1

## 2018-03-14 MED ORDER — IPRATROPIUM BROMIDE 0.02 % IN SOLN
0.5000 mg | Freq: Four times a day (QID) | RESPIRATORY_TRACT | Status: DC
  Administered 2018-03-15 (×2): 0.5 mg via RESPIRATORY_TRACT
  Filled 2018-03-14 (×2): qty 2.5

## 2018-03-14 MED ORDER — INSULIN GLARGINE 100 UNIT/ML ~~LOC~~ SOLN
14.0000 [IU] | Freq: Every day | SUBCUTANEOUS | Status: DC
  Administered 2018-03-15: 14 [IU] via SUBCUTANEOUS
  Filled 2018-03-14 (×3): qty 0.14

## 2018-03-14 MED ORDER — INSULIN ASPART 100 UNIT/ML ~~LOC~~ SOLN
0.0000 [IU] | Freq: Three times a day (TID) | SUBCUTANEOUS | Status: DC
  Administered 2018-03-15: 7 [IU] via SUBCUTANEOUS
  Administered 2018-03-15 (×2): 9 [IU] via SUBCUTANEOUS
  Administered 2018-03-16: 7 [IU] via SUBCUTANEOUS

## 2018-03-14 MED ORDER — AZELASTINE-FLUTICASONE 137-50 MCG/ACT NA SUSP: 1.0000 | Freq: Two times a day (BID) | NASAL | Status: DC | PRN

## 2018-03-14 MED ORDER — SODIUM CHLORIDE 0.9 % IV BOLUS
500.0000 mL | Freq: Once | INTRAVENOUS | Status: AC
  Administered 2018-03-14: 500 mL via INTRAVENOUS

## 2018-03-14 MED ORDER — LORATADINE 10 MG PO TABS
10.0000 mg | ORAL_TABLET | Freq: Every day | ORAL | Status: DC
  Administered 2018-03-15 – 2018-03-20 (×6): 10 mg via ORAL
  Filled 2018-03-14 (×6): qty 1

## 2018-03-14 MED ORDER — IPRATROPIUM BROMIDE 0.02 % IN SOLN
0.5000 mg | RESPIRATORY_TRACT | Status: DC
  Administered 2018-03-14: 0.5 mg via RESPIRATORY_TRACT
  Filled 2018-03-14: qty 2.5

## 2018-03-14 MED ORDER — LEVOFLOXACIN 750 MG PO TABS
750.0000 mg | ORAL_TABLET | Freq: Every day | ORAL | Status: DC
  Administered 2018-03-15 – 2018-03-17 (×3): 750 mg via ORAL
  Filled 2018-03-14 (×3): qty 1

## 2018-03-14 MED ORDER — LEVALBUTEROL HCL 1.25 MG/0.5ML IN NEBU
1.2500 mg | INHALATION_SOLUTION | Freq: Four times a day (QID) | RESPIRATORY_TRACT | Status: DC
  Administered 2018-03-14 – 2018-03-15 (×3): 1.25 mg via RESPIRATORY_TRACT
  Filled 2018-03-14 (×3): qty 0.5

## 2018-03-14 MED ORDER — COLLAGENASE 250 UNIT/GM EX OINT: 1.0000 "application " | TOPICAL_OINTMENT | CUTANEOUS | Status: DC | PRN

## 2018-03-14 MED ORDER — IPRATROPIUM-ALBUTEROL 0.5-2.5 (3) MG/3ML IN SOLN: 3.0000 mL | Freq: Once | RESPIRATORY_TRACT | Status: DC

## 2018-03-14 MED ORDER — HYDROCORTISONE 1 % EX CREA
1.0000 "application " | TOPICAL_CREAM | Freq: Two times a day (BID) | CUTANEOUS | Status: DC
  Administered 2018-03-15 – 2018-03-20 (×12): 1 via TOPICAL
  Filled 2018-03-14: qty 28

## 2018-03-14 MED ORDER — HYDROCERIN EX CREA
1.0000 "application " | TOPICAL_CREAM | Freq: Two times a day (BID) | CUTANEOUS | Status: DC
  Administered 2018-03-15 – 2018-03-20 (×12): 1 via TOPICAL
  Filled 2018-03-14: qty 113

## 2018-03-14 MED ORDER — IPRATROPIUM-ALBUTEROL 0.5-2.5 (3) MG/3ML IN SOLN
3.0000 mL | Freq: Once | RESPIRATORY_TRACT | Status: AC
  Administered 2018-03-14: 3 mL via RESPIRATORY_TRACT
  Filled 2018-03-14: qty 3

## 2018-03-14 MED ORDER — FOLIC ACID 1 MG PO TABS
1.0000 mg | ORAL_TABLET | Freq: Every day | ORAL | Status: DC
  Administered 2018-03-15 – 2018-03-20 (×6): 1 mg via ORAL
  Filled 2018-03-14 (×6): qty 1

## 2018-03-14 NOTE — ED Provider Notes (Signed)
[facility] EMERGENCY DEPARTMENT Provider Note   CSN: [id number] Arrival date & time: 03/14/18  1932     History   Chief Complaint Chief Complaint  Patient presents with  . Abnormal Lab    HPI Heather Petty is a 68 y.o. female.  Patient with history of diabetes, high cholesterol, hypertension, asthma who presents to the ED from rehab facility for potassium of 6.4.  However patient arrives hypotensive.  EMS started breathing treatments as she was wheezing, hypoxic.  She has some shortness of breath.  But denies any chest pain.  She denies any abdominal pain, fever, cough, sputum production.  Patient states that she is not normally on oxygen.  She states that her chest does feel tight.  Patient denies any headache, vision changes.  The history is provided by the patient.  Illness  Severity:  Mild Onset quality:  Gradual Timing:  Constant Progression:  Unchanged Chronicity:  New Associated symptoms: shortness of breath and wheezing   Associated symptoms: no abdominal pain, no chest pain, no cough, no ear pain, no fever, no rash, no sore throat and no vomiting     Past Medical History:  Diagnosis Date  . AKI (acute kidney injury) ([place]) 12/17/2017  . Arthritis   . Asthma   . Depression   . Diabetes mellitus   . Gout   . High cholesterol   . Hypertension   . Morbid obesity ([place])   . Pneumonia 12/17/2017  . Schizophrenia [facility])     Patient Active Problem List   Diagnosis Date Noted  . Asthma exacerbation 03/14/2018  . Hypotension 03/14/2018  . Lactic acidosis 03/14/2018  . GERD (gastroesophageal reflux disease) 03/14/2018  . Acute renal failure superimposed on stage 3 chronic kidney disease ([place]) 03/14/2018  . Macrocytic anemia 03/14/2018  . Abnormal LFTs 03/14/2018  . Acute kidney injury ([place]) 02/28/2018  . Wound of sacral region 02/28/2018  . Hypothermia 02/28/2018  . Pressure injury of skin 02/06/2018  . Acute renal failure (ARF) ([place])  02/05/2018  . Hyperlipidemia associated with type 2 diabetes mellitus ([place]) 02/05/2018  . Hyperkalemia 02/05/2018  . Dermatitis 02/05/2018  . Open back wound 02/05/2018  . Dysuria 02/05/2018  . Paranoid schizophrenia ([place])   . CAP (community acquired pneumonia) 12/17/2017  . AKI (acute kidney injury) ([place]) 12/17/2017  . Type II diabetes mellitus with renal manifestations ([place]) 12/17/2017  . HTN (hypertension) 12/17/2017  . Gout     Past Surgical History:  Procedure Laterality Date  . CYST EXCISION    . DENTAL SURGERY    . TONSILLECTOMY       OB History   No obstetric history on file.      Home Medications    Prior to Admission medications   Medication Sig Start Date End Date Taking? Authorizing Provider  acetaminophen (TYLENOL) 325 MG tablet Take 650 mg by mouth 4 (four) times daily as needed (for pain and not to exceed 3,000 mg in a 24-hr period).    Yes [provider]  albuterol (VENTOLIN HFA) 108 (90 Base) MCG/ACT inhaler Inhale 2 puffs into the lungs every 4 (four) hours as needed for wheezing or shortness of breath.   Yes [provider]  allopurinol (ZYLOPRIM) 300 MG tablet Take 300 mg by mouth daily.   Yes [provider]  amLODipine (NORVASC) 10 MG tablet Take 10 mg by mouth daily.   Yes [provider]  Azelastine-Fluticasone (DYMISTA) 137-50 MCG/ACT SUSP Place 1 spray into both nostrils  2 (two) times daily as needed (for allergies).    Yes [provider]  b complex vitamins tablet Take 1 tablet by mouth daily.   Yes [provider]  benazepril (LOTENSIN) 20 MG tablet Take 20 mg by mouth daily.   Yes [provider]  bisacodyl (DULCOLAX) 5 MG EC tablet Take 1 tablet (5 mg total) by mouth daily as needed for moderate constipation. Patient taking differently: Take 5 mg by mouth daily as needed for mild constipation.  12/24/17  Yes Lady Deutscher, MD  cetirizine (ZYRTEC) 10 MG tablet Take 10 mg by mouth  daily.    Yes [provider]  collagenase (SANTYL) ointment Apply topically daily. Patient taking differently: Apply 1 application topically See admin instructions. Apply to wound bed after cleansing, then cover with gauze moistened with normal saline- apply bordered silicone dressing daily (pressure ulcer on back, pressure ulcer of left buttock and pressure ulcer of sacral region) 02/09/18  Yes Elgergawy, Silver Huguenin, MD  cyanocobalamin (,VITAMIN B-12,) 1000 MCG/ML injection Inject 1 mL (1,000 mcg total) into the muscle once a week. Patient taking differently: Inject 1,000 mcg into the muscle every Monday.  02/09/18  Yes Elgergawy, Silver Huguenin, MD  doxepin (SINEQUAN) 25 MG capsule Take 25 mg by mouth 2 (two) times daily.    Yes [provider]  famotidine (PEPCID) 20 MG tablet Take 20 mg by mouth at bedtime.    Yes [provider]  folic acid (FOLVITE) 1 MG tablet Take 1 mg by mouth daily.   Yes [provider]  furosemide (LASIX) 20 MG tablet Take 20 mg by mouth daily.   Yes [provider]  gabapentin (NEURONTIN) 300 MG capsule Take 300 mg by mouth 3 (three) times daily.   Yes [provider]  gabapentin (NEURONTIN) 600 MG tablet Take 0.5 tablets (300 mg total) by mouth 3 (three) times daily. 12/23/17 03/14/18 Yes Purohit, Konrad Dolores, MD  hydrocortisone 2.5 % cream Apply 1 application topically See admin instructions. Apply to rash on face 2 times a day   Yes [provider]  hydrOXYzine (ATARAX/VISTARIL) 25 MG tablet Take 25 mg by mouth 3 (three) times daily.   Yes [provider]  insulin aspart (NOVOLOG) 100 UNIT/ML injection Take subcu QA CHS, 140-199 - 2 units, 200-250 - 6 units, 251-299 - 8 units,  300-349 - 10 units,  350 or above 14 units. Patient taking differently: Inject 0-12 Units into the skin See admin instructions. Inject 0-12 units into the skin 4 times a day (before meals and at bedtime) per sliding scale: BGL <60 or >400  =  CALL MD; 60-200 = 0 units; 201-250 = 2 units; 251-300 = 4 units; 301-350 = 6 units; 351-400 = 8 units; 401-450 = 10 units; >450 = 12 units 03/05/18  Yes Thurnell Lose, MD  insulin glargine (LANTUS) 100 UNIT/ML injection Inject 0.2 mLs (20 Units total) into the skin at bedtime. 03/05/18  Yes Thurnell Lose, MD  ipratropium-albuterol (DUONEB) 0.5-2.5 (3) MG/3ML SOLN Take 3 mLs by nebulization 4 (four) times daily.    Yes [provider]  levofloxacin (LEVAQUIN) 750 MG tablet Take 750 mg by mouth daily. FOR 14 DAYS 03/12/18 03/26/18 Yes [provider]  Multiple Vitamins-Minerals (THERA-M MULTIPLE VITAMINS PO) Take 1 tablet by mouth daily.   Yes [provider]  nystatin (MYCOSTATIN/NYSTOP) powder Apply topically See admin instructions. Apply between folds, beneath breasts, bilateral axillae, and under pannus   Yes  [provider]  polyethylene glycol (MIRALAX / GLYCOLAX) packet Take 17 g by mouth 2 (two) times daily. Patient taking differently: Take 17 g by mouth daily as needed for mild constipation.  12/24/17  Yes Lady Deutscher, MD  potassium chloride (K-DUR) 10 MEQ tablet Take 10 mEq by mouth daily. 12/03/17  Yes [provider]  pravastatin (PRAVACHOL) 40 MG tablet Take 40 mg by mouth daily.   Yes [provider]  risperiDONE (RISPERDAL) 0.5 MG tablet Take 1 tablet (0.5 mg total) by mouth 2 (two) times daily. 12/23/17 03/14/18 Yes Purohit, Konrad Dolores, MD  silver sulfADIAZINE (SILVADENE) 1 % cream Apply topically 2 (two) times daily. 02/09/18  Yes Elgergawy, Silver Huguenin, MD  Skin Protectants, Misc. (EUCERIN) cream Apply 1 application topically See admin instructions. Apply to areas of "generalized body for dry/itchy skin" 2 times a day   Yes [provider]  traMADol (ULTRAM) 50 MG tablet Take 50 mg by mouth 4 (four) times daily as needed (for pain).   Yes [provider]  triamcinolone (KENALOG) 0.147 MG/GM topical spray Apply 1  spray topically See admin instructions. Apply to head in the direction of hair growth   Yes [provider]  vitamin B-6 (PYRIDOXINE) 25 MG tablet Take 25 mg by mouth daily.   Yes [provider]  vitamin C (ASCORBIC ACID) 500 MG tablet Take 500 mg by mouth 2 (two) times daily.   Yes [provider]  Vitamin D, Ergocalciferol, (DRISDOL) 1.25 MG (50000 UT) CAPS capsule Take 50,000 Units by mouth every Monday.    Yes [provider]  Zinc Sulfate (ZINC-220 PO) Take 220 mg by mouth daily.   Yes [provider]  benazepril (LOTENSIN) 10 MG tablet Take 1 tablet (10 mg total) by mouth daily. Patient not taking: Reported on 03/14/2018 03/05/18   Thurnell Lose, MD    Family History Family History  Family history unknown: Yes    Social History Social History   Tobacco Use  . Smoking status: Former Research scientist (life sciences)  . Smokeless tobacco: Never Used  Substance Use Topics  . Alcohol use: No  . Drug use: No     Allergies   Patient has no known allergies.   Review of Systems Review of Systems  Constitutional: Negative for chills and fever.  HENT: Negative for ear pain and sore throat.   Eyes: Negative for pain and visual disturbance.  Respiratory: Positive for shortness of breath and wheezing. Negative for cough.   Cardiovascular: Negative for chest pain and palpitations.  Gastrointestinal: Negative for abdominal pain and vomiting.  Genitourinary: Negative for dysuria and hematuria.  Musculoskeletal: Negative for arthralgias and back pain.  Skin: Negative for color change and rash.  Neurological: Negative for seizures and syncope.  All other systems reviewed and are negative.    Physical Exam Updated Vital Signs  ED Triage Vitals  Enc Vitals Group     BP 03/14/18 1940 111/60     Pulse Rate 03/14/18 1940 (!) 106     Resp 03/14/18 1940 (!) 22     Temp 03/14/18 1940 (!) 97.4 F (36.3 C)     Temp Source 03/14/18 1940 Oral     SpO2 03/14/18  1940 100 %     Weight --      Height --      Head Circumference --      Peak Flow --      Pain Score 03/14/18 1936 0  Pain Loc --      Pain Edu? --      Excl. in [place]? --     Physical Exam Vitals signs and nursing note reviewed.  Constitutional:      General: She is not in acute distress.    Appearance: She is well-developed. She is ill-appearing (chronic).  HENT:     Head: Normocephalic and atraumatic.     Nose: Nose normal.     Mouth/Throat:     Mouth: Mucous membranes are dry.  Eyes:     Extraocular Movements: Extraocular movements intact.     Conjunctiva/sclera: Conjunctivae normal.     Pupils: Pupils are equal, round, and reactive to light.  Neck:     Musculoskeletal: Normal range of motion and neck supple. No muscular tenderness.  Cardiovascular:     Rate and Rhythm: Regular rhythm. Tachycardia present.     Heart sounds: Normal heart sounds. No murmur.  Pulmonary:     Effort: Pulmonary effort is normal. No respiratory distress.     Breath sounds: Normal breath sounds.  Abdominal:     General: There is no distension.     Palpations: Abdomen is soft.     Tenderness: There is no abdominal tenderness.  Musculoskeletal:     Right lower leg: Edema present.     Left lower leg: Edema present.  Skin:    General: Skin is warm and dry.     Capillary Refill: Capillary refill takes less than 2 seconds.  Neurological:     General: No focal deficit present.     Mental Status: She is alert and oriented to person, place, and time.     Cranial Nerves: No cranial nerve deficit.     Sensory: No sensory deficit.     Motor: No weakness.     Coordination: Coordination normal.     Gait: Gait normal.  Psychiatric:        Mood and Affect: Mood normal.      ED Treatments / Results  Labs (all labs ordered are listed, but only abnormal results are displayed) Labs Reviewed  CBC WITH DIFFERENTIAL/PLATELET - Abnormal; Notable for the following components:      Result Value   RBC  2.61 (*)    Hemoglobin 8.0 (*)    HCT 27.6 (*)    MCV 105.7 (*)    MCHC 29.0 (*)    RDW 17.2 (*)    All other components within normal limits  COMPREHENSIVE METABOLIC PANEL - Abnormal; Notable for the following components:   Potassium 6.2 (*)    Chloride 112 (*)    CO2 20 (*)    Glucose, Bld 223 (*)    BUN 70 (*)    Creatinine, Ser 3.00 (*)    Total Protein 5.8 (*)    Albumin 2.2 (*)    AST 43 (*)    ALT 46 (*)    Alkaline Phosphatase 366 (*)    GFR calc non Af Amer 15 (*)    GFR calc Af Amer 18 (*)    All other components within normal limits  URINALYSIS, ROUTINE W REFLEX MICROSCOPIC - Abnormal; Notable for the following components:   Glucose, UA 50 (*)    Leukocytes, UA MODERATE (*)    Bacteria, UA RARE (*)    All other components within normal limits  LACTIC ACID, PLASMA - Abnormal; Notable for the following components:   Lactic Acid, Venous 2.2 (*)    All other components within normal limits  CBG MONITORING, ED - Abnormal; Notable for the following components:   Glucose-Capillary 243 (*)    All other components within normal limits  CBG MONITORING, ED - Abnormal; Notable for the following components:   Glucose-Capillary 262 (*)    All other components within normal limits  CULTURE, BLOOD (ROUTINE X 2)  CULTURE, BLOOD (ROUTINE X 2)  URINE CULTURE  BRAIN NATRIURETIC PEPTIDE  NA AND K (SODIUM & POTASSIUM), RAND UR  CREATININE, URINE, RANDOM  OSMOLALITY, URINE  INFLUENZA PANEL BY PCR (TYPE A & B)  LACTIC ACID, PLASMA  LACTIC ACID, PLASMA  PROCALCITONIN  BASIC METABOLIC PANEL  CBC  I-STAT TROPONIN, ED    EKG EKG Interpretation  Date/Time:  Thursday March 14 2018 19:42:11 EST Ventricular Rate:  105 PR Interval:    QRS Duration: 97 QT Interval:  357 QTC Calculation: 472 R Axis:   20 Text Interpretation:  Sinus tachycardia Low voltage, extremity and precordial leads Confirmed by Lennice Sites [phone]) on 03/14/2018 9:12:54 PM   Radiology Dg Chest Port 1  View  Result Date: 03/14/2018 CLINICAL DATA:  Shortness of breath EXAM: PORTABLE CHEST 1 VIEW COMPARISON:  03/05/2018 FINDINGS: There is bibasilar atelectasis. There is elevation of the right diaphragm. There is no focal consolidation. There is no pleural effusion or pneumothorax. The heart and mediastinal contours are unremarkable. The osseous structures are unremarkable. IMPRESSION: No active disease. Electronically Signed   By: Kathreen Devoid   On: 03/14/2018 20:25    Procedures .Critical Care Performed by: Lennice Sites, DO Authorized by: Lennice Sites, DO   Critical care provider statement:    Critical care time (minutes):  52   Critical care was necessary to treat or prevent imminent or life-threatening deterioration of the following conditions:  Shock, renal failure, respiratory failure and dehydration   Critical care was time spent personally by me on the following activities:  Blood draw for specimens, development of treatment plan with patient or surrogate, evaluation of patient's response to treatment, examination of patient, interpretation of cardiac output measurements, obtaining history from patient or surrogate, ordering and performing treatments and interventions, ordering and review of laboratory studies, ordering and review of radiographic studies, pulse oximetry, re-evaluation of patient's condition, review of old charts and discussions with primary provider   I assumed direction of critical care for this patient from another provider in my specialty: no     (including critical care time)  Medications Ordered in ED Medications  methylPREDNISolone sodium succinate (SOLU-MEDROL) 125 mg/2 mL injection 125 mg (has no administration in time range)  levalbuterol (XOPENEX) nebulizer solution 1.25 mg (1.25 mg Nebulization Given 03/14/18 2339)  dextromethorphan-guaiFENesin (MUCINEX DM) 30-600 MG per 12 hr tablet 1 tablet (has no administration in time range)  sodium polystyrene  (KAYEXALATE) 15 GM/60ML suspension 30 g (has no administration in time range)  allopurinol (ZYLOPRIM) tablet 300 mg (has no administration in time range)  traMADol (ULTRAM) tablet 50 mg (has no administration in time range)  levofloxacin (LEVAQUIN) tablet 750 mg (has no administration in time range)  pravastatin (PRAVACHOL) tablet 40 mg (has no administration in time range)  doxepin (SINEQUAN) capsule 25 mg (has no administration in time range)  hydrOXYzine (ATARAX/VISTARIL) tablet 25 mg (has no administration in time range)  risperiDONE (RISPERDAL) tablet 0.5 mg (has no administration in time range)  insulin glargine (LANTUS) injection 14 Units (has no administration in time range)  bisacodyl (DULCOLAX) EC tablet 5 mg (has no administration in time range)  famotidine (PEPCID) tablet 20  mg (has no administration in time range)  polyethylene glycol (MIRALAX / GLYCOLAX) packet 17 g (has no administration in time range)  folic acid (FOLVITE) tablet 1 mg (has no administration in time range)  gabapentin (NEURONTIN) capsule 300 mg (has no administration in time range)  b complex vitamins tablet 1 tablet (has no administration in time range)  vitamin B-6 (pyridOXINE) tablet 25 mg (has no administration in time range)  vitamin C (ASCORBIC ACID) tablet 500 mg (has no administration in time range)  zinc sulfate capsule 220 mg (has no administration in time range)  Azelastine-Fluticasone 137-50 MCG/ACT SUSP 1 spray (has no administration in time range)  loratadine (CLARITIN) tablet 10 mg (has no administration in time range)  collagenase (SANTYL) ointment 1 application (has no administration in time range)  hydrocortisone 2.5 % cream 1 application (has no administration in time range)  nystatin (MYCOSTATIN/NYSTOP) topical powder (has no administration in time range)  silver sulfADIAZINE (SILVADENE) 1 % cream (has no administration in time range)  eucerin cream 1 application (has no administration in  time range)  triamcinolone (KENALOG) topical spray 1 spray (has no administration in time range)  enoxaparin (LOVENOX) injection 40 mg (has no administration in time range)  ondansetron (ZOFRAN) tablet 4 mg (has no administration in time range)    Or  ondansetron (ZOFRAN) injection 4 mg (has no administration in time range)  hydrALAZINE (APRESOLINE) injection 5 mg (has no administration in time range)  0.9 %  sodium chloride infusion (has no administration in time range)  insulin aspart (novoLOG) injection 0-9 Units (has no administration in time range)  ipratropium (ATROVENT) nebulizer solution 0.5 mg (has no administration in time range)  ipratropium-albuterol (DUONEB) 0.5-2.5 (3) MG/3ML nebulizer solution 3 mL (3 mLs Nebulization Given 03/14/18 2034)  sodium chloride 0.9 % bolus 500 mL (0 mLs Intravenous Stopped 03/14/18 2104)  sodium chloride 0.9 % bolus 500 mL (0 mLs Intravenous Stopped 03/14/18 2124)  sodium chloride 0.9 % bolus 1,000 mL (0 mLs Intravenous Stopped 03/14/18 2237)  insulin aspart (novoLOG) injection 5 Units (5 Units Intravenous Given 03/14/18 2138)  methylPREDNISolone sodium succinate (SOLU-MEDROL) 125 mg/2 mL injection 125 mg (125 mg Intravenous Given 03/14/18 2127)  sodium chloride 0.9 % bolus 1,000 mL (1,000 mLs Intravenous New Bag/Given 03/14/18 2240)      EMERGENCY DEPARTMENT [place] CARDIAC EXAM "Study: Limited Ultrasound of the Heart and Pericardium"  INDICATIONS:Abnormal vital signs Multiple views of the heart and pericardium were obtained in real-time with a multi-frequency probe.  PERFORMED LY:YTKPTW IMAGES ARCHIVED?: Yes LIMITATIONS:  Body habitus and Emergent procedure VIEWS USED: Parasternal long axis and Inferior Vena Cava INTERPRETATION: Cardiac activity present and IVC flat  Initial Impression / Assessment and Plan / ED Course  I have reviewed the triage vital signs and the nursing notes.  Pertinent labs & imaging results that were available during my care of the  patient were reviewed by me and considered in my medical decision making (see chart for details).     Heather Petty is a 68 year old female with history of hypertension, high cholesterol, asthma, diabetes, schizophrenia who presents to the ED from rehab facility due to concern for elevated potassium.  However, upon arrival patient is hypotensive, hypoxic, tachycardic.  However she denies any chest pain, abdominal pain.  Has had some shortness of breath.  She was noted by EMS to have wheezing and chest tightness consistent with her prior asthma symptoms and got a DuoNeb treatment.  She states that symptoms have improved following  breathing treatment.  She is wheezing on exam.  She does have some edema in her legs.  She was recently admitted for kidney injury as well.  Patient supposedly had a potassium of 6.4.  However clearly there appears to be other processes undergoing.  Rectal temperature shows that patient is hypothermic.  Sepsis work-up was initiated with blood cultures, urine studies, chest x-ray.  Influenza testing was ordered.  Patient given additional normal saline bolus.  Patient had mild lactic acidosis of 2.2.  After initial normal saline bolus blood pressure did not improve much.  Bedside echocardiogram was performed and was overall equivocal due to poor body habitus.  However the IVC did appear be very collapsible significant for dehydration.  Patient had creatinine of 3 which her baseline in 1.  Patient had a potassium of 6.2.  Chest x-ray showed no pneumonia, no pneumothorax, no signs of volume overload.  Overall there was no infectious source found.  Urinalysis was negative.  Therefore 2 L of normal saline were ordered for hypotension that I believe is due to dehydration/volume depletion.  Do not believe the patient is septic.  Following fluid resuscitation blood pressure did normalize.  Tachycardia also improved.  Troponin is normal.  BNP is normal.  Do not suspect PE as patient likely  has asthma exacerbation and dehydration.  Patient was given IV insulin for hyperkalemia.  Patient was admitted to the hospitalist service for further care.  This chart was dictated using voice recognition software.  Despite best efforts to proofread,  errors can occur which can change the documentation meaning.   Final Clinical Impressions(s) / ED Diagnoses   Final diagnoses:  Hyperkalemia  AKI (acute kidney injury) ([place])  Hypotension, unspecified hypotension type  Dehydration  Moderate asthma without complication, unspecified whether persistent  Acute respiratory failure with hypoxia [facility])    ED Discharge Orders    None       Lennice Sites, DO 03/15/18 0015

## 2018-03-14 NOTE — ED Triage Notes (Signed)
Patient is from [facility] with abnormal labs.  Staff a facility reports K of 6.4.  No complaints at this time.  History of PNA and is wheezing upon EMS arrival.  Patient was given 5mg  albuterol en route to ED.

## 2018-03-14 NOTE — H&P (Signed)
History and Physical    Heather Petty IRJ:[id number] DOB: [birth year] DOA: 03/14/2018  Referring MD/NP/PA:   PCP: [name], MD   Patient coming from:  The patient is coming from SNF.  At baseline, pt is dependent for most of ADL.        Chief Complaint: Abnormal lab with hyperkalemia  HPI: Heather Petty is a 68 y.o. female with medical history significant of hyperlipidemia, diabetes mellitus, asthma, GERD, gout, depression, schizophrenia, morbid obesity, CKD 3, who presents with abnormal lab with hyperkalemia.  Patient was found to have hyperkalemia in the facility, with potassium of 6.4, therefore was transferred to ED for further evaluation and treatment.  Patient states that she has cough, shortness of breath and wheezing, stuffy nose.  No fever or chills.  Denies any chest pain.  No nausea vomiting, diarrhea, abdominal pain.  She has mild dysuria, no burning on urination.  Denies any unilateral weakness.  Patient states that she feels very thirsty, asking for water.  Initially patient was hypotensive with blood pressure 74/35, which improved to 107/57 after giving 2 L normal saline bolus in ED.   ED Course: pt was found to have potassium 6.2, WBC 7.2, negative troponin, lactic acid 2.2, negative urinalysis, BNP 48.9, worsening renal function, abnormal liver function (ALP 366, AST 43, ALT 46, total bilirubin 0.6), temperature 96.5, tachycardia, tachypnea, oxygen saturation 91% on room air.  Chest x-ray negative.  Patient is placed on telemetry bed for observation.  Review of Systems:   General: no fevers, chills, no body weight gain, has fatigue HEENT: no blurry vision, hearing changes or sore throat Respiratory: has dyspnea, coughing, wheezing CV: no chest pain, no palpitations GI: no nausea, vomiting, abdominal pain, diarrhea, constipation GU: has mild dysuria, no burning on urination, increased urinary frequency, hematuria  Ext: no leg edema Neuro: no unilateral weakness,  numbness, or tingling, no vision change or hearing loss Skin: has sacral decubitus ulcer MSK: No muscle spasm, no deformity, no limitation of range of movement in spin Heme: No easy bruising.  Travel history: No recent long distant travel.  Allergy: No Known Allergies  Past Medical History:  Diagnosis Date  . AKI (acute kidney injury) ([place]) 12/17/2017  . Arthritis   . Asthma   . Depression   . Diabetes mellitus   . Gout   . High cholesterol   . Hypertension   . Morbid obesity ([place])   . Pneumonia 12/17/2017  . Schizophrenia [facility])     Past Surgical History:  Procedure Laterality Date  . CYST EXCISION    . DENTAL SURGERY    . TONSILLECTOMY      Social History:  reports that she has quit smoking. She has never used smokeless tobacco. She reports that she does not drink alcohol or use drugs.  Family History:  Pt is adopted Family History  Family history unknown: Yes     Prior to Admission medications   Medication Sig Start Date End Date Taking? Authorizing Provider  acetaminophen (TYLENOL) 325 MG tablet Take 650 mg by mouth 4 (four) times daily as needed (for pain and not to exceed 3,000 mg in a 24-hr period).    Yes [provider]  albuterol (VENTOLIN HFA) 108 (90 Base) MCG/ACT inhaler Inhale 2 puffs into the lungs every 4 (four) hours as needed for wheezing or shortness of breath.   Yes [provider]  allopurinol (ZYLOPRIM) 300 MG tablet Take 300 mg by mouth daily.   Yes [provider]  amLODipine (NORVASC) 10 MG tablet Take 10 mg by mouth daily.   Yes [provider]  Azelastine-Fluticasone (DYMISTA) 137-50 MCG/ACT SUSP Place 1 spray into both nostrils 2 (two) times daily as needed (for allergies).    Yes [provider]  b complex vitamins tablet Take 1 tablet by mouth daily.   Yes [provider]  benazepril (LOTENSIN) 20 MG tablet Take 20 mg by mouth daily.   Yes [provider]  bisacodyl (DULCOLAX) 5  MG EC tablet Take 1 tablet (5 mg total) by mouth daily as needed for moderate constipation. Patient taking differently: Take 5 mg by mouth daily as needed for mild constipation.  12/24/17  Yes Lady Deutscher, MD  cetirizine (ZYRTEC) 10 MG tablet Take 10 mg by mouth daily.    Yes [provider]  collagenase (SANTYL) ointment Apply topically daily. Patient taking differently: Apply 1 application topically See admin instructions. Apply to wound bed after cleansing, then cover with gauze moistened with normal saline- apply bordered silicone dressing daily (pressure ulcer on back, pressure ulcer of left buttock and pressure ulcer of sacral region) 02/09/18  Yes Elgergawy, Silver Huguenin, MD  cyanocobalamin (,VITAMIN B-12,) 1000 MCG/ML injection Inject 1 mL (1,000 mcg total) into the muscle once a week. Patient taking differently: Inject 1,000 mcg into the muscle every Monday.  02/09/18  Yes Elgergawy, Silver Huguenin, MD  doxepin (SINEQUAN) 25 MG capsule Take 25 mg by mouth 2 (two) times daily.    Yes [provider]  famotidine (PEPCID) 20 MG tablet Take 20 mg by mouth at bedtime.    Yes [provider]  folic acid (FOLVITE) 1 MG tablet Take 1 mg by mouth daily.   Yes [provider]  furosemide (LASIX) 20 MG tablet Take 20 mg by mouth daily.   Yes [provider]  gabapentin (NEURONTIN) 300 MG capsule Take 300 mg by mouth 3 (three) times daily.   Yes [provider]  gabapentin (NEURONTIN) 600 MG tablet Take 0.5 tablets (300 mg total) by mouth 3 (three) times daily. 12/23/17 03/14/18 Yes Purohit, Konrad Dolores, MD  hydrocortisone 2.5 % cream Apply 1 application topically See admin instructions. Apply to rash on face 2 times a day   Yes [provider]  hydrOXYzine (ATARAX/VISTARIL) 25 MG tablet Take 25 mg by mouth 3 (three) times daily.   Yes [provider]  insulin aspart (NOVOLOG) 100 UNIT/ML injection Take subcu QA CHS, 140-199 - 2 units, 200-250 -  6 units, 251-299 - 8 units,  300-349 - 10 units,  350 or above 14 units. Patient taking differently: Inject 0-12 Units into the skin See admin instructions. Inject 0-12 units into the skin 4 times a day (before meals and at bedtime) per sliding scale: BGL <60 or >400  = CALL MD; 60-200 = 0 units; 201-250 = 2 units; 251-300 = 4 units; 301-350 = 6 units; 351-400 = 8 units; 401-450 = 10 units; >450 = 12 units 03/05/18  Yes Thurnell Lose, MD  insulin glargine (LANTUS) 100 UNIT/ML injection Inject 0.2 mLs (20 Units total) into the skin at bedtime. 03/05/18  Yes Thurnell Lose, MD  ipratropium-albuterol (DUONEB) 0.5-2.5 (3) MG/3ML SOLN Take 3 mLs by nebulization 4 (four) times daily.    Yes [provider]  levofloxacin (LEVAQUIN) 750 MG tablet Take 750 mg by mouth daily. FOR 14 DAYS 03/12/18 03/26/18 Yes [provider]  Multiple Vitamins-Minerals (THERA-M MULTIPLE VITAMINS PO) Take 1 tablet by mouth  daily.   Yes [provider]  nystatin (MYCOSTATIN/NYSTOP) powder Apply topically See admin instructions. Apply between folds, beneath breasts, bilateral axillae, and under pannus   Yes [provider]  polyethylene glycol (MIRALAX / GLYCOLAX) packet Take 17 g by mouth 2 (two) times daily. Patient taking differently: Take 17 g by mouth daily as needed for mild constipation.  12/24/17  Yes Lady Deutscher, MD  potassium chloride (K-DUR) 10 MEQ tablet Take 10 mEq by mouth daily. 12/03/17  Yes [provider]  pravastatin (PRAVACHOL) 40 MG tablet Take 40 mg by mouth daily.   Yes [provider]  risperiDONE (RISPERDAL) 0.5 MG tablet Take 1 tablet (0.5 mg total) by mouth 2 (two) times daily. 12/23/17 03/14/18 Yes Purohit, Konrad Dolores, MD  silver sulfADIAZINE (SILVADENE) 1 % cream Apply topically 2 (two) times daily. 02/09/18  Yes Elgergawy, Silver Huguenin, MD  Skin Protectants, Misc. (EUCERIN) cream Apply 1 application topically See admin instructions. Apply to areas of  "generalized body for dry/itchy skin" 2 times a day   Yes [provider]  traMADol (ULTRAM) 50 MG tablet Take 50 mg by mouth 4 (four) times daily as needed (for pain).   Yes [provider]  triamcinolone (KENALOG) 0.147 MG/GM topical spray Apply 1 spray topically See admin instructions. Apply to head in the direction of hair growth   Yes [provider]  vitamin B-6 (PYRIDOXINE) 25 MG tablet Take 25 mg by mouth daily.   Yes [provider]  vitamin C (ASCORBIC ACID) 500 MG tablet Take 500 mg by mouth 2 (two) times daily.   Yes [provider]  Vitamin D, Ergocalciferol, (DRISDOL) 1.25 MG (50000 UT) CAPS capsule Take 50,000 Units by mouth every Monday.    Yes [provider]  Zinc Sulfate (ZINC-220 PO) Take 220 mg by mouth daily.   Yes [provider]  benazepril (LOTENSIN) 10 MG tablet Take 1 tablet (10 mg total) by mouth daily. Patient not taking: Reported on 03/14/2018 03/05/18   Thurnell Lose, MD    Physical Exam: Vitals:   03/14/18 2330 03/14/18 2341 03/14/18 2345 03/15/18 0045  BP: (!) 115/56 (!) 115/56 (!) 124/52 (!) 97/44  Pulse: (!) 102 (!) 106 (!) 103 (!) 105  Resp: 18 19 20 18   Temp:    97.7 F (36.5 C)  TempSrc:    Oral  SpO2: 91% 98% 98% 99%   General: Not in acute distress.  Dry mucous membrane HEENT:       Eyes: PERRL, EOMI, no scleral icterus.       ENT: No discharge from the ears and nose, no pharynx injection, no tonsillar enlargement.        Neck: No JVD, no bruit, no mass felt. Heme: No neck lymph node enlargement. Cardiac: S1/S2, RRR, No murmurs, No gallops or rubs. Respiratory: Has wheezing and rhonchi bilaterally GI: Soft, nondistended, nontender, no rebound pain, no organomegaly, BS present. GU: No hematuria Ext: No pitting leg edema bilaterally. 2+DP/PT pulse bilaterally. Musculoskeletal: No joint deformities, No joint redness or warmth, no limitation of ROM in spin. Skin: has sacral decubitus  ulcer Neuro: Alert, oriented X3, cranial nerves II-XII grossly intact, moves all extremities normally. Psych: Patient is not psychotic, no suicidal or hemocidal ideation.  Labs on Admission: I have personally reviewed following labs and imaging studies  CBC: Recent Labs  Lab 03/14/18 2011  WBC 7.2  NEUTROABS 5.0  HGB 8.0*  HCT 27.6*  MCV 105.7*  PLT 226  Basic Metabolic Panel: Recent Labs  Lab 03/14/18 2011  NA 142  K 6.2*  CL 112*  CO2 20*  GLUCOSE 223*  BUN 70*  CREATININE 3.00*  CALCIUM 9.0   GFR: Estimated Creatinine Clearance: 21.5 mL/min (A) (by C-G formula based on SCr of 3 mg/dL (H)). Liver Function Tests: Recent Labs  Lab 03/14/18 2011  AST 43*  ALT 46*  ALKPHOS 366*  BILITOT 0.6  PROT 5.8*  ALBUMIN 2.2*   No results for input(s): LIPASE, AMYLASE in the last 168 hours. No results for input(s): AMMONIA in the last 168 hours. Coagulation Profile: No results for input(s): INR, PROTIME in the last 168 hours. Cardiac Enzymes: No results for input(s): CKTOTAL, CKMB, CKMBINDEX, TROPONINI in the last 168 hours. BNP (last 3 results) No results for input(s): PROBNP in the last 8760 hours. HbA1C: No results for input(s): HGBA1C in the last 72 hours. CBG: Recent Labs  Lab 03/14/18 2130 03/14/18 2243 03/15/18 0058  GLUCAP 243* 262* 348*   Lipid Profile: No results for input(s): CHOL, HDL, LDLCALC, TRIG, CHOLHDL, LDLDIRECT in the last 72 hours. Thyroid Function Tests: No results for input(s): TSH, T4TOTAL, FREET4, T3FREE, THYROIDAB in the last 72 hours. Anemia Panel: No results for input(s): VITAMINB12, FOLATE, FERRITIN, TIBC, IRON, RETICCTPCT in the last 72 hours. Urine analysis:    Component Value Date/Time   COLORURINE YELLOW 03/14/2018 2113   APPEARANCEUR CLEAR 03/14/2018 2113   LABSPEC 1.014 03/14/2018 2113   PHURINE 5.0 03/14/2018 2113   GLUCOSEU 50 (A) 03/14/2018 2113   HGBUR NEGATIVE 03/14/2018 2113   BILIRUBINUR NEGATIVE 03/14/2018 2113    KETONESUR NEGATIVE 03/14/2018 2113   PROTEINUR NEGATIVE 03/14/2018 2113   UROBILINOGEN 0.2 12/27/2010 1858   NITRITE NEGATIVE 03/14/2018 2113   LEUKOCYTESUR MODERATE (A) 03/14/2018 2113   Sepsis Labs: @LABRCNTIP (procalcitonin:4,lacticidven:4) )No results found for this or any previous visit (from the past 240 hour(s)).   Radiological Exams on Admission: Dg Chest Port 1 View  Result Date: 03/14/2018 CLINICAL DATA:  Shortness of breath EXAM: PORTABLE CHEST 1 VIEW COMPARISON:  03/05/2018 FINDINGS: There is bibasilar atelectasis. There is elevation of the right diaphragm. There is no focal consolidation. There is no pleural effusion or pneumothorax. The heart and mediastinal contours are unremarkable. The osseous structures are unremarkable. IMPRESSION: No active disease. Electronically Signed   By: Kathreen Devoid   On: 03/14/2018 20:25     EKG: Independently reviewed.  Sinus rhythm, tachycardia, QTC 472, low voltage, nonspecific T wave change.  Assessment/Plan Principal Problem:   Hyperkalemia Active Problems:   Gout   Type II diabetes mellitus with renal manifestations (HCC)   HTN (hypertension)   Hyperlipidemia associated with type 2 diabetes mellitus (HCC)   Pressure injury of skin   Asthma exacerbation   Hypotension   Lactic acidosis   GERD (gastroesophageal reflux disease)   Acute renal failure superimposed on stage 3 chronic kidney disease (HCC)   Macrocytic anemia   Abnormal LFTs   Hyperkalemia: K=6.2, likely due to dehydration, worsening renal function and continuation of potassium chloride in the facility. -Placed on telemetry bed for observation - Patient was treated with 5 units of NovoLog in the ED - Kayexalate 30 g x 1 -Follow-up by BMP  Asthma exacerbation: Patient has rhonchi and wheezing bilaterally on auscultation, indicating asthma exacerbation.  Chest x-ray negative for infiltration. -Atrovent nebulizer, PRN Xopenex nebulizer - Solu-Medrol 60 mg 3 times  daily -PRN Mucinex for cough -Follow-up for flu PCR  Hypotension and lactic acidosis: Patient  was initially hypotensive with a blood pressure 74/35, which improved to 107/57 after giving 2 L normal saline bolus in ED.  Her lactic acid is elevated at 2.2.  Patient does not have fever or leukocytosis.  Patient is clinically dry on physical examination.  Her hypotension and lactic acidosis are likely due to dehydration rather than sepsis, though we cannot completely rule out sepsis. UA negative. CXR negative. Pt is taking Levaquin in the facility for suspected pneumonia. -will get Procalcitonin and trend lactic acid levels per sepsis protocol. -IVF: 4L of NS bolus in ED, followed by 125 cc/h  -blood culture - will continue her home Levaquin and follow-up procalcitonin level.  If procalcitonin is normal, will discontinue antibiotics.  Gout: -continue home allopurinol  Type II diabetes mellitus with renal manifestations ([place]): Last A1c 6.7 on 12/18/17, well controled. Patient is taking NovoLog and Lantus at home -will decrease Lantus dose from 20 to 14 units daily -SSI  HTN:  -hold home medications due to hypotension -IV hydralazine prn  Hyperlipidemia associated with type 2 diabetes mellitus ([place]): -Pravastatin  Pressure injury of skin: -wound care consult  GERD (gastroesophageal reflux disease): -pepcid  Macrocytic anemia: Hemoglobin baseline 8.0-9.0.  Her hemoglobin 8.0 today.  No active bleeding. -Follow-up with CBC  AoCKD-III: Baseline Cre is 1.00, pt's Cre is 3.0, BUN 70 on admission. Likely due to prerenal secondary to dehydration and continuation of ARB, diuretics. - IVF as above - Follow up renal function by BMP - Hold Lotensin and Lasix - Avoid using renal toxic medications   Abnormal LFTs: Patient had an negative hepatitis panel except for positive IgM of Hep A in 12/2017.  Her total bilirubin is normal.  AST and ALT mildly elevated 43/46. -Avoid using liver toxic  medications, such as Tylenol  DVT ppx: SQ Lovenox Code Status: Full code Family Communication: None at bed side.  Disposition Plan:  Anticipate discharge back to previous SNF Consults called:  none Admission status: Obs / tele    ate of Service 03/15/2018    [facility]   If 7PM-7AM, please contact night-coverage [email or web address] Password [email or web address] 03/15/2018, 2:27 AM

## 2018-03-15 DIAGNOSIS — Z6841 Body Mass Index (BMI) 40.0 and over, adult: Secondary | ICD-10-CM | POA: Diagnosis not present

## 2018-03-15 DIAGNOSIS — J4541 Moderate persistent asthma with (acute) exacerbation: Secondary | ICD-10-CM | POA: Diagnosis not present

## 2018-03-15 DIAGNOSIS — Z794 Long term (current) use of insulin: Secondary | ICD-10-CM

## 2018-03-15 DIAGNOSIS — D631 Anemia in chronic kidney disease: Secondary | ICD-10-CM | POA: Diagnosis present

## 2018-03-15 DIAGNOSIS — D539 Nutritional anemia, unspecified: Secondary | ICD-10-CM | POA: Diagnosis present

## 2018-03-15 DIAGNOSIS — L89313 Pressure ulcer of right buttock, stage 3: Secondary | ICD-10-CM | POA: Diagnosis present

## 2018-03-15 DIAGNOSIS — E872 Acidosis: Secondary | ICD-10-CM | POA: Diagnosis present

## 2018-03-15 DIAGNOSIS — E785 Hyperlipidemia, unspecified: Secondary | ICD-10-CM | POA: Diagnosis present

## 2018-03-15 DIAGNOSIS — R0902 Hypoxemia: Secondary | ICD-10-CM | POA: Diagnosis present

## 2018-03-15 DIAGNOSIS — J189 Pneumonia, unspecified organism: Secondary | ICD-10-CM | POA: Diagnosis present

## 2018-03-15 DIAGNOSIS — J45901 Unspecified asthma with (acute) exacerbation: Secondary | ICD-10-CM | POA: Diagnosis present

## 2018-03-15 DIAGNOSIS — M109 Gout, unspecified: Secondary | ICD-10-CM

## 2018-03-15 DIAGNOSIS — K219 Gastro-esophageal reflux disease without esophagitis: Secondary | ICD-10-CM | POA: Diagnosis present

## 2018-03-15 DIAGNOSIS — L89323 Pressure ulcer of left buttock, stage 3: Secondary | ICD-10-CM | POA: Diagnosis present

## 2018-03-15 DIAGNOSIS — E1169 Type 2 diabetes mellitus with other specified complication: Secondary | ICD-10-CM

## 2018-03-15 DIAGNOSIS — E1129 Type 2 diabetes mellitus with other diabetic kidney complication: Secondary | ICD-10-CM

## 2018-03-15 DIAGNOSIS — E1165 Type 2 diabetes mellitus with hyperglycemia: Secondary | ICD-10-CM | POA: Diagnosis present

## 2018-03-15 DIAGNOSIS — F209 Schizophrenia, unspecified: Secondary | ICD-10-CM | POA: Diagnosis present

## 2018-03-15 DIAGNOSIS — E860 Dehydration: Secondary | ICD-10-CM | POA: Diagnosis present

## 2018-03-15 DIAGNOSIS — K760 Fatty (change of) liver, not elsewhere classified: Secondary | ICD-10-CM | POA: Diagnosis present

## 2018-03-15 DIAGNOSIS — R945 Abnormal results of liver function studies: Secondary | ICD-10-CM | POA: Diagnosis not present

## 2018-03-15 DIAGNOSIS — E875 Hyperkalemia: Secondary | ICD-10-CM | POA: Diagnosis present

## 2018-03-15 DIAGNOSIS — N183 Chronic kidney disease, stage 3 (moderate): Secondary | ICD-10-CM | POA: Diagnosis present

## 2018-03-15 DIAGNOSIS — N179 Acute kidney failure, unspecified: Secondary | ICD-10-CM | POA: Diagnosis present

## 2018-03-15 DIAGNOSIS — Z87891 Personal history of nicotine dependence: Secondary | ICD-10-CM | POA: Diagnosis not present

## 2018-03-15 DIAGNOSIS — E1122 Type 2 diabetes mellitus with diabetic chronic kidney disease: Secondary | ICD-10-CM | POA: Diagnosis present

## 2018-03-15 DIAGNOSIS — I129 Hypertensive chronic kidney disease with stage 1 through stage 4 chronic kidney disease, or unspecified chronic kidney disease: Secondary | ICD-10-CM | POA: Diagnosis present

## 2018-03-15 LAB — BASIC METABOLIC PANEL
Anion gap: 13 (ref 5–15)
BUN: 62 mg/dL — AB (ref 8–23)
CHLORIDE: 112 mmol/L — AB (ref 98–111)
CO2: 16 mmol/L — ABNORMAL LOW (ref 22–32)
CREATININE: 2.59 mg/dL — AB (ref 0.44–1.00)
Calcium: 8.6 mg/dL — ABNORMAL LOW (ref 8.9–10.3)
GFR calc Af Amer: 21 mL/min — ABNORMAL LOW (ref >60)
GFR calc non Af Amer: 18 mL/min — ABNORMAL LOW (ref >60)
Glucose, Bld: 418 mg/dL — ABNORMAL HIGH (ref 70–99)
Potassium: 7.4 mmol/L (ref 3.5–5.1)
Sodium: 141 mmol/L (ref 135–145)

## 2018-03-15 LAB — CBC
HEMATOCRIT: 28.5 % — AB (ref 36.0–46.0)
Hemoglobin: 8.5 g/dL — ABNORMAL LOW (ref 12.0–15.0)
MCH: 32 pg (ref 26.0–34.0)
MCHC: 29.8 g/dL — ABNORMAL LOW (ref 30.0–36.0)
MCV: 107.1 fL — ABNORMAL HIGH (ref 80.0–100.0)
Platelets: 217 10*3/uL (ref 150–400)
RBC: 2.66 MIL/uL — ABNORMAL LOW (ref 3.87–5.11)
RDW: 17.3 % — ABNORMAL HIGH (ref 11.5–15.5)
WBC: 4.5 10*3/uL (ref 4.0–10.5)
nRBC: 0.4 % — ABNORMAL HIGH (ref 0.0–0.2)

## 2018-03-15 LAB — GLUCOSE, CAPILLARY
GLUCOSE-CAPILLARY: 366 mg/dL — AB (ref 70–99)
Glucose-Capillary: 348 mg/dL — ABNORMAL HIGH (ref 70–99)
Glucose-Capillary: 345 mg/dL — ABNORMAL HIGH (ref 70–99)
Glucose-Capillary: 359 mg/dL — ABNORMAL HIGH (ref 70–99)
Glucose-Capillary: 382 mg/dL — ABNORMAL HIGH (ref 70–99)
Glucose-Capillary: 387 mg/dL — ABNORMAL HIGH (ref 70–99)

## 2018-03-15 LAB — LACTIC ACID, PLASMA
Lactic Acid, Venous: 2.4 mmol/L (ref 0.5–1.9)
Lactic Acid, Venous: 1.6 mmol/L (ref 0.5–1.9)

## 2018-03-15 LAB — PROCALCITONIN: Procalcitonin: 0.15 ng/mL

## 2018-03-15 MED ORDER — INSULIN GLARGINE 100 UNIT/ML ~~LOC~~ SOLN
20.0000 [IU] | Freq: Every day | SUBCUTANEOUS | Status: DC
  Administered 2018-03-15 – 2018-03-19 (×5): 20 [IU] via SUBCUTANEOUS
  Filled 2018-03-15 (×6): qty 0.2

## 2018-03-15 MED ORDER — IPRATROPIUM BROMIDE 0.02 % IN SOLN
0.5000 mg | Freq: Three times a day (TID) | RESPIRATORY_TRACT | Status: DC
  Administered 2018-03-15 – 2018-03-20 (×15): 0.5 mg via RESPIRATORY_TRACT
  Filled 2018-03-15 (×16): qty 2.5

## 2018-03-15 MED ORDER — INSULIN ASPART 100 UNIT/ML ~~LOC~~ SOLN
5.0000 [IU] | Freq: Once | SUBCUTANEOUS | Status: AC
  Administered 2018-03-15: 5 [IU] via SUBCUTANEOUS

## 2018-03-15 MED ORDER — SODIUM CHLORIDE 0.9 % IV BOLUS
1000.0000 mL | Freq: Once | INTRAVENOUS | Status: AC
  Administered 2018-03-15: 1000 mL via INTRAVENOUS

## 2018-03-15 MED ORDER — CALCIUM GLUCONATE-NACL 1-0.675 GM/50ML-% IV SOLN
1.0000 g | Freq: Once | INTRAVENOUS | Status: AC
  Administered 2018-03-15: 1000 mg via INTRAVENOUS
  Filled 2018-03-15: qty 50

## 2018-03-15 MED ORDER — ENOXAPARIN SODIUM 30 MG/0.3ML ~~LOC~~ SOLN
30.0000 mg | SUBCUTANEOUS | Status: DC
  Administered 2018-03-16 – 2018-03-17 (×2): 30 mg via SUBCUTANEOUS
  Filled 2018-03-15 (×2): qty 0.3

## 2018-03-15 MED ORDER — FLUTICASONE PROPIONATE 50 MCG/ACT NA SUSP: 1.0000 | Freq: Two times a day (BID) | NASAL | Status: DC | PRN

## 2018-03-15 MED ORDER — SODIUM ZIRCONIUM CYCLOSILICATE 10 G PO PACK
10.0000 g | PACK | Freq: Every day | ORAL | Status: DC
  Administered 2018-03-15 – 2018-03-20 (×5): 10 g via ORAL
  Filled 2018-03-15 (×6): qty 1

## 2018-03-15 MED ORDER — INSULIN ASPART 100 UNIT/ML IV SOLN
10.0000 [IU] | Freq: Once | INTRAVENOUS | Status: AC
  Administered 2018-03-15: 10 [IU] via INTRAVENOUS

## 2018-03-15 MED ORDER — AZELASTINE HCL 0.1 % NA SOLN: 1.0000 | Freq: Two times a day (BID) | NASAL | Status: DC | PRN

## 2018-03-15 MED ORDER — LEVALBUTEROL HCL 1.25 MG/0.5ML IN NEBU
1.2500 mg | INHALATION_SOLUTION | Freq: Three times a day (TID) | RESPIRATORY_TRACT | Status: DC
  Administered 2018-03-15 – 2018-03-20 (×15): 1.25 mg via RESPIRATORY_TRACT
  Filled 2018-03-15 (×16): qty 0.5

## 2018-03-15 MED ORDER — INSULIN ASPART 100 UNIT/ML ~~LOC~~ SOLN
7.0000 [IU] | Freq: Once | SUBCUTANEOUS | Status: AC
  Administered 2018-03-16: 7 [IU] via SUBCUTANEOUS

## 2018-03-15 NOTE — Progress Notes (Signed)
Pt. Lactic acid 2.4. On call provider B[place] notified. No new orders given. Will continue to monitor.

## 2018-03-15 NOTE — Consult Note (Signed)
[place] Nurse wound consult note Reason for Consult: Dermatologic condition, Stage 3 pressure areas to medial buttocks (POA), full thickness wounds on left chest, posterior right knee full thickness wound, intravenous infiltration related wound on left upper extremity. Patient known to our team from previous admissions, last seen by this writer on 03/01/18 and by may partner, S. Doty on 02/07/18 Wound type: Pressure, moisture, disease-specific Pressure Injury POA: Yes Measurement: Stage 3 pressure injuries to bilateral medial buttocks, R>L Right measures 4cm x 3cm x 0.2cm, left measures 3cm x 2.8cm x 0.2cm.  Both with 100% red, moist tissue, serous drainage. Left upper extremity:  3.5cm x 3cm area of black, dry eschar. No exudate Right posterior knee:  1.4cm round x 0.2cm Red, moist (small amount serous drainage) Posterior left chest: 6cm x 15cm area affected with 3 areas of depth measuring 1cm. Yellow and red wound bed, moderate amount of light yellow exudate. Note: the medial aspect of the superior wound communicates with the inferior wound. Inframamary and subpannicular skin folds with moisture associated skin damage, maceration. Wound bed:As described above Drainage (amount, consistency, odor) As described above Periwound: With dermatologic condition resulting in irregular pigmentation, dryness and itching. Has been treated by [facility] in the recent past.  Dressing procedure/placement/frequency: I will continue previous therapies and provide a mattress replacement with low air loss feature.Turning and repositioning is in place and patient is educated to minimize time in the supine position.Topical care to the intertriginous areas of dermatitis with our house antimicrobial textile ([employer] Ag+) is ordered.  Guidance for Nursing to perform topical wound care to the Stage 3 medial buttock pressure injuries, left arm eschar from the previous IV infiltrate and right posterior knee wound  with xeroform is provided and to the left posterior chest full thickness wound with communication (see above).  [place] nursing team will not follow, but will remain available to this patient, the nursing and medical teams.  Please re-consult if needed. Thanks, Maudie Flakes, MSN, RN, [place], Arther Abbott  Pager# [phone]

## 2018-03-15 NOTE — Progress Notes (Signed)
PROGRESS NOTE    Heather Petty  QPY:[id number] DOB: 02-Nov-1950 DOA: 03/14/2018 PCP: [name], MD   Brief Narrative:  HPI on 03/14/2017 by Dr. Ivor Costa Heather Petty is a 68 y.o. female with medical history significant of hyperlipidemia, diabetes mellitus, asthma, GERD, gout, depression, schizophrenia, morbid obesity, CKD 3, who presents with abnormal lab with hyperkalemia.  Patient was found to have hyperkalemia in the facility, with potassium of 6.4, therefore was transferred to ED for further evaluation and treatment.  Patient states that she has cough, shortness of breath and wheezing, stuffy nose.  No fever or chills.  Denies any chest pain.  No nausea vomiting, diarrhea, abdominal pain.  She has mild dysuria, no burning on urination.  Denies any unilateral weakness.  Patient states that she feels very thirsty, asking for water.  Initially patient was hypotensive with blood pressure 74/35, which improved to 107/57 after giving 2 L normal saline bolus in ED. Assessment & Plan   Hyperkalemia -Upon admission, potassium was 6.2 -Patient was given potassium chloride at her nursing facility -She was given NovoLog 5 units in the emergency department along with Kayexalate -Potassium elevated to 7.4 this morning -Ordered Lokelma,, calcium gluconate, insulin and will continue to monitor potassium levels  Asthma Exacerbation -Chest x-ray reviewed and unremarkable for infection -Patient presented with wheezing and rhonchi bilaterally -Continue nebulizer treatments, Solu-Medrol, Mucinex -Influenza PCR unremarkable  Hypotension and lactic acidosis -Patient presented with hypotension, blood pressure 74/35 which improved after IV fluids.  Her lactic acid was also elevated but that has since trended downward into normal range. -Patient currently afebrile with no leukocytosis -He did appear dry on physical examination -UA as well as chest x-ray unremarkable for infection -Patient was  receiving Levaquin at the nursing facility for suspected pneumonia which has been continued  Acute kidney injury on chronic kidney disease, stage III -Baseline creatinine approximately 1, creatinine on admission was 3 -Lotensin and Lasix held -Possibly worsened with hypotension vs medications  -Creatinine down to 2.59 -Continue IV fluid, monitor BMP  Abnormal LFTs -Patient has had a negative hepatitis panel, although was positive for hep A IgM in November 2019 -Continue IVF -Continue to monitor  Diabetes mellitus, type II with hyperglycemia -Last hemoglobin A1c 6.7 on 12/18/2016 -Continue Lantus, insulin sliding scale and CBG monitoring -Patient currently receiving Solu-Medrol for asthma exacerbation, blood sugars have been uncontrolled at this to home dose  Essential hypertension -Home medications held as patient presented with hypotension -Continue to monitor  Hyperlipidemia -Continue statin  GERD -Continue Pepcid  Chronic macrocytic anemia -Baseline hemoglobin 8-9, currently appears to be stable -Continue to monitor CBC  Gout -Continue allopurinol  DVT Prophylaxis  Lovenox  Code Status: Full  Family Communication: None at bedside  Disposition Plan: Observation.  However given increase in potassium/hyperkalemia as well as continued acute kidney injury, feel patient will likely need an additional 1 to 2 days in the hospital receiving IV fluids as well as intervention for hyperkalemia.  Consultants None  Procedures  None  Antibiotics   Anti-infectives (From admission, onward)   Start     Dose/Rate Route Frequency Ordered Stop   03/15/18 1000  levofloxacin (LEVAQUIN) tablet 750 mg    Note to Pharmacy:  FOR 14 DAYS     750 mg Oral Daily 03/14/18 2314 03/26/18 0959      Subjective:   Heather Petty seen and examined today.  Continues to have cough and shortness of breath.  Patient states she has been diagnosed with pneumonia prior  to coming into the  hospital and was on antibiotics.  Currently denies chest pain, abdominal pain, nausea or vomiting, diarrhea constipation, dizziness or headache.  Objective:   Vitals:   03/15/18 0637 03/15/18 0828 03/15/18 0902 03/15/18 1532  BP: (!) 105/42 (!) 116/42    Pulse: 94 (!) 101    Resp: 18 (!) 21    Temp: 97.8 F (36.6 C) 97.9 F (36.6 C)    TempSrc: Oral Oral    SpO2: 95% 98% 95% 95%    Intake/Output Summary (Last 24 hours) at 03/15/2018 1546 Last data filed at 03/15/2018 1300 Gross per 24 hour  Intake 5309.2 ml  Output 700 ml  Net 4609.2 ml   There were no vitals filed for this visit.  Exam  General: Well developed, well nourished, NAD, appears stated age  61: NCAT, mucous membranes dry  Neck: Supple  Cardiovascular: S1 S2 auscultated, RRR  Respiratory: Diminished breath sounds with expiratory wheezing  Abdomen: Soft, nontender, nondistended, + bowel sounds  Extremities: warm dry without cyanosis clubbing or edema  Neuro: AAOx3, nonfocal  Psych: Normal affect and demeanor    Data Reviewed: I have personally reviewed following labs and imaging studies  CBC: Recent Labs  Lab 03/14/18 2011 03/15/18 0712  WBC 7.2 4.5  NEUTROABS 5.0  --   HGB 8.0* 8.5*  HCT 27.6* 28.5*  MCV 105.7* 107.1*  PLT 226 450   Basic Metabolic Panel: Recent Labs  Lab 03/14/18 2011 03/15/18 0712  NA 142 141  K 6.2* 7.4*  CL 112* 112*  CO2 20* 16*  GLUCOSE 223* 418*  BUN 70* 62*  CREATININE 3.00* 2.59*  CALCIUM 9.0 8.6*   GFR: Estimated Creatinine Clearance: 25 mL/min (A) (by C-G formula based on SCr of 2.59 mg/dL (H)). Liver Function Tests: Recent Labs  Lab 03/14/18 2011  AST 43*  ALT 46*  ALKPHOS 366*  BILITOT 0.6  PROT 5.8*  ALBUMIN 2.2*   No results for input(s): LIPASE, AMYLASE in the last 168 hours. No results for input(s): AMMONIA in the last 168 hours. Coagulation Profile: No results for input(s): INR, PROTIME in the last 168 hours. Cardiac Enzymes: No  results for input(s): CKTOTAL, CKMB, CKMBINDEX, TROPONINI in the last 168 hours. BNP (last 3 results) No results for input(s): PROBNP in the last 8760 hours. HbA1C: No results for input(s): HGBA1C in the last 72 hours. CBG: Recent Labs  Lab 03/14/18 2130 03/14/18 2243 03/15/18 0058 03/15/18 0729 03/15/18 1127  GLUCAP 243* 262* 348* 366* 345*   Lipid Profile: No results for input(s): CHOL, HDL, LDLCALC, TRIG, CHOLHDL, LDLDIRECT in the last 72 hours. Thyroid Function Tests: No results for input(s): TSH, T4TOTAL, FREET4, T3FREE, THYROIDAB in the last 72 hours. Anemia Panel: No results for input(s): VITAMINB12, FOLATE, FERRITIN, TIBC, IRON, RETICCTPCT in the last 72 hours. Urine analysis:    Component Value Date/Time   COLORURINE YELLOW 03/14/2018 2113   APPEARANCEUR CLEAR 03/14/2018 2113   LABSPEC 1.014 03/14/2018 2113   PHURINE 5.0 03/14/2018 2113   GLUCOSEU 50 (A) 03/14/2018 2113   HGBUR NEGATIVE 03/14/2018 2113   BILIRUBINUR NEGATIVE 03/14/2018 2113   KETONESUR NEGATIVE 03/14/2018 2113   PROTEINUR NEGATIVE 03/14/2018 2113   UROBILINOGEN 0.2 12/27/2010 1858   NITRITE NEGATIVE 03/14/2018 2113   LEUKOCYTESUR MODERATE (A) 03/14/2018 2113   Sepsis Labs: @LABRCNTIP (procalcitonin:4,lacticidven:4)  ) Recent Results (from the past 240 hour(s))  Blood culture (routine x 2)     Status: None (Preliminary result)   Collection Time: 03/14/18  8:12 PM  Result Value Ref Range Status   Specimen Description BLOOD SITE NOT SPECIFIED  Final   Special Requests   Final    BOTTLES DRAWN AEROBIC AND ANAEROBIC Blood Culture adequate volume   Culture   Final    NO GROWTH < 24 HOURS Performed at [facility], [place] [address], [place], [place] [address]    Report Status PENDING  Incomplete  Blood culture (routine x 2)     Status: None (Preliminary result)   Collection Time: 03/14/18  9:02 PM  Result Value Ref Range Status   Specimen Description BLOOD RIGHT ANTECUBITAL  Final    Special Requests   Final    BOTTLES DRAWN AEROBIC AND ANAEROBIC Blood Culture results may not be optimal due to an inadequate volume of blood received in culture bottles   Culture   Final    NO GROWTH < 24 HOURS Performed at [facility], [place] [address], [place], [place] [address]    Report Status PENDING  Incomplete      Radiology Studies: Dg Chest Port 1 View  Result Date: 03/14/2018 CLINICAL DATA:  Shortness of breath EXAM: PORTABLE CHEST 1 VIEW COMPARISON:  03/05/2018 FINDINGS: There is bibasilar atelectasis. There is elevation of the right diaphragm. There is no focal consolidation. There is no pleural effusion or pneumothorax. The heart and mediastinal contours are unremarkable. The osseous structures are unremarkable. IMPRESSION: No active disease. Electronically Signed   By: Kathreen Devoid   On: 03/14/2018 20:25     Scheduled Meds: . allopurinol  300 mg Oral Daily  . B-complex with vitamin C  1 tablet Oral Daily  . doxepin  25 mg Oral BID  . [START ON 03/16/2018] enoxaparin (LOVENOX) injection  30 mg Subcutaneous Q24H  . famotidine  20 mg Oral QHS  . folic acid  1 mg Oral Daily  . gabapentin  300 mg Oral TID  . hydrocerin  1 application Topical BID  . hydrocortisone cream  1 application Topical BID  . hydrOXYzine  25 mg Oral TID  . insulin aspart  0-9 Units Subcutaneous TID WC  . insulin glargine  14 Units Subcutaneous QHS  . ipratropium  0.5 mg Nebulization Q6H  . levalbuterol  1.25 mg Nebulization Q6H  . levofloxacin  750 mg Oral Daily  . loratadine  10 mg Oral Daily  . methylPREDNISolone (SOLU-MEDROL) injection  125 mg Intravenous Q8H  . pravastatin  40 mg Oral q1800  . risperiDONE  0.5 mg Oral BID  . sodium zirconium cyclosilicate  10 g Oral Daily  . vitamin B-6  25 mg Oral Daily  . vitamin C  500 mg Oral BID  . zinc sulfate  220 mg Oral Daily   Continuous Infusions: . sodium chloride 125 mL/hr at 03/15/18 1238     LOS: 0 days   Time Spent in minutes   30  minutes  Cheo Selvey D.O. on 03/15/2018 at 3:46 PM  Between 7am to 7pm - Please see pager noted on [email or web address]  After 7pm go to [email or web address]  And look for the night coverage person covering for me after hours  [facility] Office  [phone]

## 2018-03-15 NOTE — Progress Notes (Addendum)
Report received from ED nurse. Pt. Arrived on unit via stretcher. A/O x 4. Placed on telemetry. CBG 384, 5 units of novolog given per on call provider order. Will continue to monitor.

## 2018-03-15 NOTE — Progress Notes (Signed)
Inpatient Diabetes Program Recommendations  AACE/ADA: New Consensus Statement on Inpatient Glycemic Control (2015)  Target Ranges:  Prepandial:   less than 140 mg/dL      Peak postprandial:   less than 180 mg/dL (1-2 hours)      Critically ill patients:  140 - 180 mg/dL   Lab Results  Component Value Date   GLUCAP 345 (H) 03/15/2018   HGBA1C 6.7 (H) 12/18/2017     Results for KARALINA, TIFT (MRN [id number]) as of 03/15/2018 14:37  Ref. Range 03/14/2018 21:30 03/14/2018 22:43 03/15/2018 00:58 03/15/2018 07:29 03/15/2018 11:27  Glucose-Capillary Latest Ref Range: 70 - 99 mg/dL 243 (H) 262 (H) 348 (H) 366 (H) 345 (H)     DM2  Home DM meds: Novolog SSI  Ac&hs (currently in a SNF)                             Lantus 20 units q hs  Current inpatient DM meds: Novolog sensitive (0-9 units) tid                                               Lantus 14 units q hs   Solumedrol 125 mg Q 8.   Patient's blood glucose is elevated most likely due to steroids.    MD please consider following inpatient insulin recommendations:   1. Increase to full home dose of Lantus 20 units q hs                                                     2. Increase Novolog to MODERATE correction scale (0-15 units) tid    As steroids are decreased most likely the insulin requirements will be reduced  (likely then decrease back to Novolog sensitive correction).  Thank you.  -- Will follow during hospitalization.--  Jonna Clark RN, MSN Diabetes Coordinator Inpatient Glycemic Control Team Team Pager: [phone] (8am-5pm)

## 2018-03-16 ENCOUNTER — Other Ambulatory Visit: Payer: Self-pay

## 2018-03-16 ENCOUNTER — Inpatient Hospital Stay (HOSPITAL_COMMUNITY): Payer: Medicare Other

## 2018-03-16 DIAGNOSIS — R945 Abnormal results of liver function studies: Secondary | ICD-10-CM | POA: Diagnosis not present

## 2018-03-16 DIAGNOSIS — E875 Hyperkalemia: Secondary | ICD-10-CM | POA: Diagnosis not present

## 2018-03-16 DIAGNOSIS — N179 Acute kidney failure, unspecified: Secondary | ICD-10-CM | POA: Diagnosis not present

## 2018-03-16 DIAGNOSIS — J4541 Moderate persistent asthma with (acute) exacerbation: Secondary | ICD-10-CM | POA: Diagnosis not present

## 2018-03-16 LAB — COMPREHENSIVE METABOLIC PANEL WITH GFR
ALT: 78 U/L — ABNORMAL HIGH (ref 0–44)
AST: 71 U/L — ABNORMAL HIGH (ref 15–41)
Albumin: 2.3 g/dL — ABNORMAL LOW (ref 3.5–5.0)
Alkaline Phosphatase: 405 U/L — ABNORMAL HIGH (ref 38–126)
Anion gap: 6 (ref 5–15)
BUN: 42 mg/dL — ABNORMAL HIGH (ref 8–23)
CO2: 19 mmol/L — ABNORMAL LOW (ref 22–32)
Calcium: 8.5 mg/dL — ABNORMAL LOW (ref 8.9–10.3)
Chloride: 117 mmol/L — ABNORMAL HIGH (ref 98–111)
Creatinine, Ser: 1.81 mg/dL — ABNORMAL HIGH (ref 0.44–1.00)
GFR calc Af Amer: 32 mL/min — ABNORMAL LOW
GFR calc non Af Amer: 28 mL/min — ABNORMAL LOW
Glucose, Bld: 386 mg/dL — ABNORMAL HIGH (ref 70–99)
Potassium: 4.9 mmol/L (ref 3.5–5.1)
Sodium: 142 mmol/L (ref 135–145)
Total Bilirubin: 0.6 mg/dL (ref 0.3–1.2)
Total Protein: 5.6 g/dL — ABNORMAL LOW (ref 6.5–8.1)

## 2018-03-16 LAB — HEMOGLOBIN AND HEMATOCRIT, BLOOD
HCT: 25.9 % — ABNORMAL LOW (ref 36.0–46.0)
Hemoglobin: 7.7 g/dL — ABNORMAL LOW (ref 12.0–15.0)

## 2018-03-16 LAB — URINE CULTURE: Culture: NO GROWTH

## 2018-03-16 LAB — GLUCOSE, CAPILLARY
Glucose-Capillary: 345 mg/dL — ABNORMAL HIGH (ref 70–99)
Glucose-Capillary: 426 mg/dL — ABNORMAL HIGH (ref 70–99)
Glucose-Capillary: 323 mg/dL — ABNORMAL HIGH (ref 70–99)
Glucose-Capillary: 290 mg/dL — ABNORMAL HIGH (ref 70–99)

## 2018-03-16 MED ORDER — METHYLPREDNISOLONE SODIUM SUCC 125 MG IJ SOLR
60.0000 mg | Freq: Three times a day (TID) | INTRAMUSCULAR | Status: DC
  Administered 2018-03-16 – 2018-03-17 (×3): 60 mg via INTRAVENOUS
  Filled 2018-03-16 (×3): qty 2

## 2018-03-16 MED ORDER — INSULIN ASPART 100 UNIT/ML ~~LOC~~ SOLN
0.0000 [IU] | Freq: Three times a day (TID) | SUBCUTANEOUS | Status: DC
  Administered 2018-03-16: 20 [IU] via SUBCUTANEOUS
  Administered 2018-03-16: 15 [IU] via SUBCUTANEOUS
  Administered 2018-03-17 (×2): 11 [IU] via SUBCUTANEOUS
  Administered 2018-03-17: 15 [IU] via SUBCUTANEOUS
  Administered 2018-03-18: 4 [IU] via SUBCUTANEOUS
  Administered 2018-03-18 – 2018-03-19 (×3): 11 [IU] via SUBCUTANEOUS
  Administered 2018-03-19: 15 [IU] via SUBCUTANEOUS
  Administered 2018-03-19: 7 [IU] via SUBCUTANEOUS
  Administered 2018-03-20 (×3): 11 [IU] via SUBCUTANEOUS

## 2018-03-16 MED ORDER — INSULIN ASPART 100 UNIT/ML ~~LOC~~ SOLN
0.0000 [IU] | Freq: Every day | SUBCUTANEOUS | Status: DC
  Administered 2018-03-16: 3 [IU] via SUBCUTANEOUS
  Administered 2018-03-17: 2 [IU] via SUBCUTANEOUS
  Administered 2018-03-18: 3 [IU] via SUBCUTANEOUS
  Administered 2018-03-19: 4 [IU] via SUBCUTANEOUS

## 2018-03-16 NOTE — NC FL2 (Signed)
[place] LEVEL OF CARE SCREENING TOOL     IDENTIFICATION  Patient Name: Heather Petty Birthdate: 1950/09/12 Sex: female Admission Date (Current Location): 03/14/2018  [place] and [place] Number:  Kathleen Argue [phone] [place] and Address:  [facility], [place] [address], [place], [place] [address]      Provider Number: [id number]  Attending Physician Name and Address:  Cristal Ford, DO  Relative Name and Phone Number:  [name], daughter, [phone]    Current Level of Care: Hospital Recommended Level of Care: [place] Prior Approval Number:    Date Approved/Denied:   PASRR Number: [id number] F End date 05/19/18  Discharge Plan: SNF    Current Diagnoses: Patient Active Problem List   Diagnosis Date Noted  . Asthma exacerbation 03/14/2018  . Hypotension 03/14/2018  . Lactic acidosis 03/14/2018  . GERD (gastroesophageal reflux disease) 03/14/2018  . Acute renal failure superimposed on stage 3 chronic kidney disease ([place]) 03/14/2018  . Macrocytic anemia 03/14/2018  . Abnormal LFTs 03/14/2018  . Acute kidney injury ([place]) 02/28/2018  . Wound of sacral region 02/28/2018  . Hypothermia 02/28/2018  . Pressure injury of skin 02/06/2018  . Acute renal failure (ARF) ([place]) 02/05/2018  . Hyperlipidemia associated with type 2 diabetes mellitus ([place]) 02/05/2018  . Hyperkalemia 02/05/2018  . Dermatitis 02/05/2018  . Open back wound 02/05/2018  . Dysuria 02/05/2018  . Paranoid schizophrenia ([place])   . CAP (community acquired pneumonia) 12/17/2017  . AKI (acute kidney injury) ([place]) 12/17/2017  . Type II diabetes mellitus with renal manifestations ([place]) 12/17/2017  . HTN (hypertension) 12/17/2017  . Gout     Orientation RESPIRATION BLADDER Height & Weight     Self, Time, Situation, Place  Normal Incontinent, External catheter Weight: 266 lb 1.5 oz (120.7 kg) Height:     BEHAVIORAL SYMPTOMS/MOOD NEUROLOGICAL  BOWEL NUTRITION STATUS      Continent Diet(see discharge summary)  AMBULATORY STATUS COMMUNICATION OF NEEDS Skin   Extensive Assist Verbally(speaks Spanish) PU Stage and Appropriate Care, Other (Comment)(MASD on groin, breasts, perineum; generalized rash with eucerin cream; stage 2 on knee; stage 3 and unstageable PU on buttocks)   PU Stage 2 Dressing: (on knee with foam PRN dressing) PU Stage 3 Dressing: (on buttocks with foam PRN dressing)                 Personal Care Assistance Level of Assistance  Bathing, Feeding, Dressing Bathing Assistance: Maximum assistance Feeding assistance: Independent Dressing Assistance: Maximum assistance     Functional Limitations Info  Sight, Hearing, Speech Sight Info: Adequate Hearing Info: Adequate Speech Info: Adequate    SPECIAL CARE FACTORS FREQUENCY  PT (By licensed PT), OT (By licensed OT)     PT Frequency: 5x week OT Frequency: 3x week            Contractures Contractures Info: Not present    Additional Factors Info  Code Status, Allergies, Insulin Sliding Scale, Psychotropic Code Status Info: Full Code Allergies Info: No Known Allergies Psychotropic Info: risperiDONE (RISPERDAL) tablet 0.5 mg 2x daily PO Insulin Sliding Scale Info: insulin aspart (novoLOG) injection 0-9 Units 3x daily with meals; insulin glargine (LANTUS) injection 20 Units daily at bedtime;        Current Medications (03/16/2018):  This is the current hospital active medication list Current Facility-Administered Medications  Medication Dose Route Frequency Provider Last Rate Last Dose  . 0.9 %  sodium chloride infusion   Intravenous Continuous Ivor Costa, MD 125 mL/hr at 03/16/18  1610    . allopurinol (ZYLOPRIM) tablet 300 mg  300 mg Oral Daily Ivor Costa, MD   300 mg at 03/16/18 0916  . azelastine (ASTELIN) 0.1 % nasal spray 1 spray  1 spray Each Nare BID PRN Ivor Costa, MD       And  . fluticasone (FLONASE) 50 MCG/ACT nasal spray 1 spray  1 spray Each  Nare BID PRN Ivor Costa, MD      . B-complex with vitamin C tablet 1 tablet  1 tablet Oral Daily Ivor Costa, MD   1 tablet at 03/16/18 [phone]  . bisacodyl (DULCOLAX) EC tablet 5 mg  5 mg Oral Daily PRN Ivor Costa, MD      . collagenase (SANTYL) ointment 1 application  1 application Topical PRN Ivor Costa, MD      . dextromethorphan-guaiFENesin [facility] DM) 30-600 MG per 12 hr tablet 1 tablet  1 tablet Oral BID PRN Ivor Costa, MD      . doxepin (SINEQUAN) capsule 25 mg  25 mg Oral BID Ivor Costa, MD   25 mg at 03/16/18 0916  . enoxaparin (LOVENOX) injection 30 mg  30 mg Subcutaneous Q24H Mikhail, [place], DO   30 mg at 03/16/18 5409  . famotidine (PEPCID) tablet 20 mg  20 mg Oral QHS Ivor Costa, MD   20 mg at 81/19/14 7829  . folic acid (FOLVITE) tablet 1 mg  1 mg Oral Daily Ivor Costa, MD   1 mg at 03/16/18 5621  . gabapentin (NEURONTIN) capsule 300 mg  300 mg Oral TID Ivor Costa, MD   300 mg at 03/16/18 3086  . hydrALAZINE (APRESOLINE) injection 5 mg  5 mg Intravenous Q2H PRN Ivor Costa, MD      . hydrocerin (EUCERIN) cream 1 application  1 application Topical BID Ivor Costa, MD   1 application at 57/84/69 6295  . hydrocortisone cream 1 % 1 application  1 application Topical BID Ivor Costa, MD   1 application at 28/41/32 4401  . hydrOXYzine (ATARAX/VISTARIL) tablet 25 mg  25 mg Oral TID Ivor Costa, MD   25 mg at 03/16/18 0916  . insulin aspart (novoLOG) injection 0-9 Units  0-9 Units Subcutaneous TID WC Ivor Costa, MD   7 Units at 03/16/18 0756  . insulin glargine (LANTUS) injection 20 Units  20 Units Subcutaneous QHS Cristal Ford, DO   20 Units at 03/15/18 2315  . ipratropium (ATROVENT) nebulizer solution 0.5 mg  0.5 mg Nebulization TID Cristal Ford, DO   0.5 mg at 03/16/18 0272  . levalbuterol (XOPENEX) nebulizer solution 1.25 mg  1.25 mg Nebulization TID Cristal Ford, DO   1.25 mg at 03/16/18 5366  . levofloxacin (LEVAQUIN) tablet 750 mg  750 mg Oral Daily Ivor Costa, MD   750 mg at  03/16/18 0916  . loratadine (CLARITIN) tablet 10 mg  10 mg Oral Daily Ivor Costa, MD   10 mg at 03/16/18 0916  . methylPREDNISolone sodium succinate (SOLU-MEDROL) 125 mg/2 mL injection 125 mg  125 mg Intravenous Cleophas Dunker, MD   125 mg at 03/16/18 0647  . nystatin (MYCOSTATIN/NYSTOP) topical powder   Topical PRN Ivor Costa, MD      . ondansetron [facility]) tablet 4 mg  4 mg Oral Q6H PRN Ivor Costa, MD       Or  . ondansetron (ZOFRAN) injection 4 mg  4 mg Intravenous Q6H PRN Ivor Costa, MD      . polyethylene glycol (MIRALAX / GLYCOLAX) packet 17 g  17 g Oral Daily PRN Ivor Costa, MD      . pravastatin (PRAVACHOL) tablet 40 mg  40 mg Oral q1800 Ivor Costa, MD   40 mg at 03/15/18 1702  . risperiDONE (RISPERDAL) tablet 0.5 mg  0.5 mg Oral BID Ivor Costa, MD   0.5 mg at 03/16/18 0916  . sodium zirconium cyclosilicate (LOKELMA) packet 10 g  10 g Oral Daily Cristal Ford, DO   10 g at 03/16/18 5027  . traMADol (ULTRAM) tablet 50 mg  50 mg Oral QID PRN Ivor Costa, MD      . triamcinolone lotion (KENALOG) 0.1 % 1 application  1 application Topical PRN Ivor Costa, MD      . vitamin B-6 (pyridOXINE) tablet 25 mg  25 mg Oral Daily Ivor Costa, MD   25 mg at 03/16/18 7412  . vitamin C (ASCORBIC ACID) tablet 500 mg  500 mg Oral BID Ivor Costa, MD   500 mg at 03/16/18 8786  . zinc sulfate capsule 220 mg  220 mg Oral Daily Ivor Costa, MD   220 mg at 03/16/18 7672     Discharge Medications: Please see discharge summary for a list of discharge medications.  Relevant Imaging Results:  Relevant Lab Results:   Additional Information SS# [place], [place]

## 2018-03-16 NOTE — Progress Notes (Signed)
CRITICAL VALUE ALERT  Critical Value:  CBG 426  Date & Time Notied: 03/16/18 1120  Provider Notified: Ree Kida 03/16/18 1125  Orders Received/Actions taken:  Give max dose of resistant scale for CBG 426

## 2018-03-16 NOTE — Progress Notes (Signed)
PROGRESS NOTE    Heather Petty  WPV:[id number] DOB: July 13, 1950 DOA: 03/14/2018 PCP: Heather Ebbs, MD   Brief Narrative:  HPI on 03/14/2017 by Dr. Ivor Petty [place] is a 68 y.o. female with medical history significant of hyperlipidemia, diabetes mellitus, asthma, GERD, gout, depression, schizophrenia, morbid obesity, CKD 3, who presents with abnormal lab with hyperkalemia.  Patient was found to have hyperkalemia in the facility, with potassium of 6.4, therefore was transferred to ED for further evaluation and treatment.  Patient states that she has cough, shortness of breath and wheezing, stuffy nose.  No fever or chills.  Denies any chest pain.  No nausea vomiting, diarrhea, abdominal pain.  She has mild dysuria, no burning on urination.  Denies any unilateral weakness.  Patient states that she feels very thirsty, asking for water.  Initially patient was hypotensive with blood pressure 74/35, which improved to 107/57 after giving 2 L normal saline bolus in ED. Assessment & Plan   Hyperkalemia -Upon admission, potassium was 6.2 -Patient was given potassium chloride at her nursing facility -She was given NovoLog 5 units in the emergency department along with Kayexalate -Despite treatment, potassium continued to rise up to 7.4. Given lokelma,, calcium gluconate, insulin  -Potassium now down to 4.9 -continue to monitor BMP  Asthma Exacerbation -Chest x-ray reviewed and unremarkable for infection -Patient presented with wheezing and rhonchi bilaterally -Continue nebulizer treatments, Solu-Medrol, Mucinex (will wean solumedrol today) -Influenza PCR unremarkable  Hypotension and lactic acidosis -Patient presented with hypotension, blood pressure 74/35 which improved after IV fluids.  Her lactic acid was also elevated but that has since trended downward into normal range. -Patient currently afebrile with no leukocytosis -He did appear dry on physical examination -UA as well as  chest x-ray unremarkable for infection -Patient was receiving Levaquin at the nursing facility for suspected pneumonia which has been continued  Acute kidney injury on chronic kidney disease, stage III -Baseline creatinine approximately 1, creatinine on admission was 3 -Lotensin and Lasix held -Possibly worsened with hypotension vs medications  -Creatinine down to 1.81 -Continue IV fluid, monitor BMP  Abnormal LFTs -Patient has had a negative hepatitis panel, although was positive for hep A IgM in November 2019 -will obtain RUQ [place] and monitor  -Continue IVF -Continue to monitor  Diabetes mellitus, type II with hyperglycemia -Last hemoglobin A1c 6.7 on 12/18/2016 -Patient currently receiving Solu-Medrol for asthma exacerbation, blood sugars have been uncontrolled  -Continue lantus, increased ISS to resistent -continue CBG monitoring   Essential hypertension -Home medications held as patient presented with hypotension -Continue to monitor  Hyperlipidemia -Continue statin  GERD -Continue Pepcid  Chronic macrocytic anemia -Baseline hemoglobin 8-9, currently 7.7 (likely dilutional component) -Continue to monitor CBC  Gout -Continue allopurinol  DVT Prophylaxis  Lovenox  Code Status: Full  Family Communication: None at bedside  Disposition Plan: Admitted. SNF at discharge.  Continue IV fluids, monitoring and better control of blood sugars.  Consultants None  Procedures  None  Antibiotics   Anti-infectives (From admission, onward)   Start     Dose/Rate Route Frequency Ordered Stop   03/15/18 1000  levofloxacin (LEVAQUIN) tablet 750 mg    Note to Pharmacy:  FOR 14 DAYS     750 mg Oral Daily 03/14/18 2314 03/26/18 0959      Subjective:   Heather Petty seen and examined today.  Patient continues to have cough.  She denies current chest pain, feels her breathing is about baseline but does have wheezing.  Denies current abdominal  pain, nausea or vomiting,  diarrhea or constipation, dizziness or headache.  Objective:   Vitals:   03/15/18 2100 03/16/18 0431 03/16/18 0904 03/16/18 0957  BP:  138/69  (!) 127/50  Pulse:  (!) 105  (!) 106  Resp:    18  Temp:  98.3 F (36.8 C)  97.7 F (36.5 C)  TempSrc:  Oral  Oral  SpO2:  97% 98% 91%  Weight: 120.7 kg       Intake/Output Summary (Last 24 hours) at 03/16/2018 1234 Last data filed at 03/16/2018 2458 Gross per 24 hour  Intake 4249.08 ml  Output 450 ml  Net 3799.08 ml   Filed Weights   03/15/18 2100  Weight: 120.7 kg   Exam  General: Well developed, well nourished, NAD, appears stated age  62: NCAT, mucous membranes moist.   Neck: Supple  Cardiovascular: S1 S2 auscultated, RRR  Respiratory: Diminished breath sounds, expiratory wheezing, cough  Abdomen: Soft, nontender, nondistended, + bowel sounds  Extremities: warm dry without cyanosis clubbing or edema  Neuro: AAOx3, nonfocal  Psych: Appropriate mood and affect  Data Reviewed: I have personally reviewed following labs and imaging studies  CBC: Recent Labs  Lab 03/14/18 2011 03/15/18 0712 03/16/18 0623  WBC 7.2 4.5  --   NEUTROABS 5.0  --   --   HGB 8.0* 8.5* 7.7*  HCT 27.6* 28.5* 25.9*  MCV 105.7* 107.1*  --   PLT 226 217  --    Basic Metabolic Panel: Recent Labs  Lab 03/14/18 2011 03/15/18 0712 03/16/18 0623  NA 142 141 142  K 6.2* 7.4* 4.9  CL 112* 112* 117*  CO2 20* 16* 19*  GLUCOSE 223* 418* 386*  BUN 70* 62* 42*  CREATININE 3.00* 2.59* 1.81*  CALCIUM 9.0 8.6* 8.5*   GFR: Estimated Creatinine Clearance: 35.7 mL/min (A) (by C-G formula based on SCr of 1.81 mg/dL (H)). Liver Function Tests: Recent Labs  Lab 03/14/18 2011 03/16/18 0623  AST 43* 71*  ALT 46* 78*  ALKPHOS 366* 405*  BILITOT 0.6 0.6  PROT 5.8* 5.6*  ALBUMIN 2.2* 2.3*   No results for input(s): LIPASE, AMYLASE in the last 168 hours. No results for input(s): AMMONIA in the last 168 hours. Coagulation Profile: No results  for input(s): INR, PROTIME in the last 168 hours. Cardiac Enzymes: No results for input(s): CKTOTAL, CKMB, CKMBINDEX, TROPONINI in the last 168 hours. BNP (last 3 results) No results for input(s): PROBNP in the last 8760 hours. HbA1C: No results for input(s): HGBA1C in the last 72 hours. CBG: Recent Labs  Lab 03/15/18 1624 03/15/18 2109 03/15/18 2316 03/16/18 0711 03/16/18 1110  GLUCAP 359* 382* 387* 345* 426*   Lipid Profile: No results for input(s): CHOL, HDL, LDLCALC, TRIG, CHOLHDL, LDLDIRECT in the last 72 hours. Thyroid Function Tests: No results for input(s): TSH, T4TOTAL, FREET4, T3FREE, THYROIDAB in the last 72 hours. Anemia Panel: No results for input(s): VITAMINB12, FOLATE, FERRITIN, TIBC, IRON, RETICCTPCT in the last 72 hours. Urine analysis:    Component Value Date/Time   COLORURINE YELLOW 03/14/2018 2113   APPEARANCEUR CLEAR 03/14/2018 2113   LABSPEC 1.014 03/14/2018 2113   PHURINE 5.0 03/14/2018 2113   GLUCOSEU 50 (A) 03/14/2018 2113   HGBUR NEGATIVE 03/14/2018 2113   BILIRUBINUR NEGATIVE 03/14/2018 2113   KETONESUR NEGATIVE 03/14/2018 2113   PROTEINUR NEGATIVE 03/14/2018 2113   UROBILINOGEN 0.2 12/27/2010 1858   NITRITE NEGATIVE 03/14/2018 2113   LEUKOCYTESUR MODERATE (A) 03/14/2018 2113   Sepsis Labs: @LABRCNTIP (procalcitonin:4,lacticidven:4)  )  Recent Results (from the past 240 hour(s))  Blood culture (routine x 2)     Status: None (Preliminary result)   Collection Time: 03/14/18  8:12 PM  Result Value Ref Range Status   Specimen Description BLOOD SITE NOT SPECIFIED  Final   Special Requests   Final    BOTTLES DRAWN AEROBIC AND ANAEROBIC Blood Culture adequate volume   Culture   Final    NO GROWTH < 24 HOURS Performed at [facility], [address] [place], [place] [address]    Report Status PENDING  Incomplete  Blood culture (routine x 2)     Status: None (Preliminary result)   Collection Time: 03/14/18  9:02 PM  Result Value Ref  Range Status   Specimen Description BLOOD RIGHT ANTECUBITAL  Final   Special Requests   Final    BOTTLES DRAWN AEROBIC AND ANAEROBIC Blood Culture results may not be optimal due to an inadequate volume of blood received in culture bottles   Culture   Final    NO GROWTH < 24 HOURS Performed at [facility], [place] [address], [place], [place] [address]    Report Status PENDING  Incomplete  Urine culture     Status: None   Collection Time: 03/14/18  9:13 PM  Result Value Ref Range Status   Specimen Description URINE, CATHETERIZED  Final   Special Requests   Final    NONE Performed at [facility], [place] [address], [place],  [address]    Culture NO GROWTH  Final   Report Status 03/16/2018 FINAL  Final      Radiology Studies: Dg Chest Port 1 View  Result Date: 03/14/2018 CLINICAL DATA:  Shortness of breath EXAM: PORTABLE CHEST 1 VIEW COMPARISON:  03/05/2018 FINDINGS: There is bibasilar atelectasis. There is elevation of the right diaphragm. There is no focal consolidation. There is no pleural effusion or pneumothorax. The heart and mediastinal contours are unremarkable. The osseous structures are unremarkable. IMPRESSION: No active disease. Electronically Signed   By: Kathreen Devoid   On: 03/14/2018 20:25     Scheduled Meds: . allopurinol  300 mg Oral Daily  . B-complex with vitamin C  1 tablet Oral Daily  . doxepin  25 mg Oral BID  . enoxaparin (LOVENOX) injection  30 mg Subcutaneous Q24H  . famotidine  20 mg Oral QHS  . folic acid  1 mg Oral Daily  . gabapentin  300 mg Oral TID  . hydrocerin  1 application Topical BID  . hydrocortisone cream  1 application Topical BID  . hydrOXYzine  25 mg Oral TID  . insulin aspart  0-20 Units Subcutaneous TID WC  . insulin aspart  0-5 Units Subcutaneous QHS  . insulin glargine  20 Units Subcutaneous QHS  . ipratropium  0.5 mg Nebulization TID  . levalbuterol  1.25 mg Nebulization TID  . levofloxacin  750 mg Oral Daily  .  loratadine  10 mg Oral Daily  . methylPREDNISolone (SOLU-MEDROL) injection  60 mg Intravenous Q8H  . pravastatin  40 mg Oral q1800  . risperiDONE  0.5 mg Oral BID  . sodium zirconium cyclosilicate  10 g Oral Daily  . vitamin B-6  25 mg Oral Daily  . vitamin C  500 mg Oral BID  . zinc sulfate  220 mg Oral Daily   Continuous Infusions: . sodium chloride 125 mL/hr at 03/16/18 0455     LOS: 1 day   Time Spent in minutes  30 minutes  Jorryn Hershberger D.O. on 03/16/2018 at 12:34 PM  Between 7am to 7pm - Please see pager noted on [email or web address]  After 7pm go to [email or web address]  And look for the night coverage person covering for me after hours  [facility] Office  [phone]

## 2018-03-17 DIAGNOSIS — R945 Abnormal results of liver function studies: Secondary | ICD-10-CM | POA: Diagnosis not present

## 2018-03-17 DIAGNOSIS — J4541 Moderate persistent asthma with (acute) exacerbation: Secondary | ICD-10-CM | POA: Diagnosis not present

## 2018-03-17 DIAGNOSIS — E875 Hyperkalemia: Secondary | ICD-10-CM | POA: Diagnosis not present

## 2018-03-17 DIAGNOSIS — N179 Acute kidney failure, unspecified: Secondary | ICD-10-CM | POA: Diagnosis not present

## 2018-03-17 LAB — COMPREHENSIVE METABOLIC PANEL
ALT: 145 U/L — ABNORMAL HIGH (ref 0–44)
AST: 145 U/L — ABNORMAL HIGH (ref 15–41)
Albumin: 2.3 g/dL — ABNORMAL LOW (ref 3.5–5.0)
Alkaline Phosphatase: 480 U/L — ABNORMAL HIGH (ref 38–126)
Anion gap: 9 (ref 5–15)
BUN: 35 mg/dL — ABNORMAL HIGH (ref 8–23)
CALCIUM: 8.7 mg/dL — AB (ref 8.9–10.3)
CO2: 19 mmol/L — ABNORMAL LOW (ref 22–32)
Chloride: 116 mmol/L — ABNORMAL HIGH (ref 98–111)
Creatinine, Ser: 1.44 mg/dL — ABNORMAL HIGH (ref 0.44–1.00)
GFR calc Af Amer: 43 mL/min — ABNORMAL LOW (ref >60)
GFR calc non Af Amer: 37 mL/min — ABNORMAL LOW (ref >60)
Glucose, Bld: 314 mg/dL — ABNORMAL HIGH (ref 70–99)
Potassium: 3.9 mmol/L (ref 3.5–5.1)
Sodium: 144 mmol/L (ref 135–145)
Total Bilirubin: 0.7 mg/dL (ref 0.3–1.2)
Total Protein: 5.6 g/dL — ABNORMAL LOW (ref 6.5–8.1)

## 2018-03-17 LAB — GLUCOSE, CAPILLARY
GLUCOSE-CAPILLARY: 250 mg/dL — AB (ref 70–99)
Glucose-Capillary: 282 mg/dL — ABNORMAL HIGH (ref 70–99)
Glucose-Capillary: 325 mg/dL — ABNORMAL HIGH (ref 70–99)
Glucose-Capillary: 285 mg/dL — ABNORMAL HIGH (ref 70–99)

## 2018-03-17 LAB — HEMOGLOBIN AND HEMATOCRIT, BLOOD
HEMATOCRIT: 25.6 % — AB (ref 36.0–46.0)
Hemoglobin: 7.8 g/dL — ABNORMAL LOW (ref 12.0–15.0)

## 2018-03-17 MED ORDER — ENOXAPARIN SODIUM 40 MG/0.4ML ~~LOC~~ SOLN
40.0000 mg | SUBCUTANEOUS | Status: DC
  Administered 2018-03-18 – 2018-03-20 (×3): 40 mg via SUBCUTANEOUS
  Filled 2018-03-17 (×3): qty 0.4

## 2018-03-17 MED ORDER — LEVOFLOXACIN 750 MG PO TABS: 750.0000 mg | ORAL_TABLET | ORAL | Status: DC

## 2018-03-17 MED ORDER — METHYLPREDNISOLONE SODIUM SUCC 125 MG IJ SOLR
60.0000 mg | Freq: Two times a day (BID) | INTRAMUSCULAR | Status: DC
  Administered 2018-03-17 – 2018-03-18 (×2): 60 mg via INTRAVENOUS
  Filled 2018-03-17 (×2): qty 2

## 2018-03-17 NOTE — Clinical Social Work Note (Signed)
Clinical Social Work Assessment  Patient Details  Name: Heather Petty MRN: [id number] Date of Birth: 06/08/1950  Date of referral:  03/17/18               Reason for consult:  Discharge Planning                Permission sought to share information with:  Case Manager Permission granted to share information::  No  Name::        Agency::     Relationship::     Contact Information:     Housing/Transportation Living arrangements for the past 2 months:  Single Family Home, Skilled Nursing Facility Source of Information:  Patient Patient Interpreter Needed:  None Criminal Activity/Legal Involvement Pertinent to Current Situation/Hospitalization:  No - Comment as needed Significant Relationships:  Adult Children Lives with:  Self Do you feel safe going back to the place where you live?  Yes Need for family participation in patient care:  No (Coment)  Care giving concerns:  Patient lives home alone but was coming from SNF. Patient is declining SNF services at this time.    Social Worker assessment / plan:    CSW met with the patient at bedside. Patient was alert and orientated. CSW discussed with the patient. Patient is declining to go back to SNF. She stated that she wanted to return home with home health services. CSW referred the patient to RNCM to coordinate home health services.   Employment status:  Retired Insurance information:  Medicare PT Recommendations:    Information / Referral to community resources:  Skilled Nursing Facility  Patient/Family's Response to care:  Patient is wanting to go home with home health.   Patient/Family's Understanding of and Emotional Response to Diagnosis, Current Treatment, and Prognosis:  Patient has family supports. Is wanting to go home.   Emotional Assessment Appearance:  Appears stated age Attitude/Demeanor/Rapport:  Unable to Assess Affect (typically observed):  Unable to Assess Orientation:  Oriented to Self, Oriented to Place,  Oriented to  Time, Oriented to Situation Alcohol / Substance use:  Not Applicable Psych involvement (Current and /or in the community):  No (Comment)  Discharge Needs  Concerns to be addressed:  Discharge Planning Concerns Readmission within the last 30 days:  Yes Current discharge risk:  Dependent with Mobility Barriers to Discharge:  Continued Medical Work up    B , LCSWA 03/17/2018, 10:09 AM  

## 2018-03-17 NOTE — Progress Notes (Signed)
CSW met with the patient. Patient is declining services at this time. CSW referred to [facility] to help with assisting home health services.   CSW is signing off at this time.   Domenic Schwab, MSW, [place]

## 2018-03-17 NOTE — Progress Notes (Signed)
NT received yellow MEW score. P.t. assessed by RN, RR 19. Mew score updated to green by taking manual respiration rate.

## 2018-03-17 NOTE — Progress Notes (Signed)
PROGRESS NOTE    Heather Petty  IRC:[id number] DOB: 03-22-1950 DOA: 03/14/2018 PCP: [name], MD   Brief Narrative:  HPI on 03/14/2017 by Dr. Ivor Costa Heather Petty is a 68 y.o. female with medical history significant of hyperlipidemia, diabetes mellitus, asthma, GERD, gout, depression, schizophrenia, morbid obesity, CKD 3, who presents with abnormal lab with hyperkalemia.  Patient was found to have hyperkalemia in the facility, with potassium of 6.4, therefore was transferred to ED for further evaluation and treatment.  Patient states that she has cough, shortness of breath and wheezing, stuffy nose.  No fever or chills.  Denies any chest pain.  No nausea vomiting, diarrhea, abdominal pain.  She has mild dysuria, no burning on urination.  Denies any unilateral weakness.  Patient states that she feels very thirsty, asking for water.  Initially patient was hypotensive with blood pressure 74/35, which improved to 107/57 after giving 2 L normal saline bolus in ED. Assessment & Plan   Hyperkalemia -Upon admission, potassium was 6.2 -Patient was given potassium chloride at her nursing facility -She was given NovoLog 5 units in the emergency department along with Kayexalate -Despite treatment, potassium continued rose to 7.4. Given lokelma,, calcium gluconate, insulin  -Potassium now down to 3.9 -continue to monitor BMP  Asthma Exacerbation -Chest x-ray reviewed and unremarkable for infection -Patient presented with wheezing and rhonchi bilaterally -Continue nebulizer treatments, Solu-Medrol, Mucinex (will continue to wean solumedrol) -Influenza PCR unremarkable  Hypotension and lactic acidosis -Patient presented with hypotension, blood pressure 74/35 which improved after IV fluids.  Her lactic acid was also elevated but that has since trended downward into normal range. -Patient currently afebrile with no leukocytosis -He did appear dry on physical examination -UA as well as  chest x-ray unremarkable for infection -Patient was receiving Levaquin at the nursing facility for suspected pneumonia which has been continued  Acute kidney injury on chronic kidney disease, stage III -Baseline creatinine approximately 1, creatinine on admission was 3 -Lotensin and Lasix held -Possibly worsened with hypotension vs medications  -Creatinine down to 1.81 -Continue IV fluid, monitor BMP  Abnormal LFTs -Patient has had a negative hepatitis panel, although was positive for hep A IgM in November 2019 -RUQ ultrasound: Hepatic steatosis.  No cholelithiasis or sonographic evidence for acute cholecystitis. -Continue IVF -Discussed with gastroenterology, given that patient has baseline insult to her liver, acute illness may certainly elevate her LFTs.  Would recheck in 1 week.  No further work-up necessary at this time. -Continue to monitor  Diabetes mellitus, type II with hyperglycemia -Last hemoglobin A1c 6.7 on 12/18/2016 -Patient currently receiving Solu-Medrol for asthma exacerbation, blood sugars have been uncontrolled -continue to wean Solu-Medrol -Continue lantus, increased ISS to resistent -continue CBG monitoring   Essential hypertension -Home medications held as patient presented with hypotension -Continue to monitor  Hyperlipidemia -Continue statin  GERD -Continue Pepcid  Chronic macrocytic anemia -Baseline hemoglobin 8-9, currently 7.8 (likely dilutional component) -Continue to monitor CBC  Gout -Continue allopurinol  DVT Prophylaxis  Lovenox  Code Status: Full  Family Communication: None at bedside  Disposition Plan: Admitted.  Patient no longer wanting to be discharged to SNF.  Will likely discharge her on 03/18/2018 to home with home health.  We will continue to monitor LFTs as well as potassium level given that patient did receive Lokelma.  Consultants None  Procedures  None  Antibiotics   Anti-infectives (From admission, onward)   Start      Dose/Rate Route Frequency Ordered Stop   03/15/18 1000  levofloxacin (LEVAQUIN) tablet 750 mg    Note to Pharmacy:  FOR 14 DAYS     750 mg Oral Daily 03/14/18 2314 03/26/18 0959      Subjective:   Heather Petty seen and examined today.  Patient continues to have cough.  Denies current chest pain or shortness of breath.  Denies abdominal pain, nausea or vomiting, diarrhea or constipation, dizziness or headache.  Objective:   Vitals:   03/17/18 0545 03/17/18 0830 03/17/18 1011 03/17/18 1013  BP: (!) 142/65  (!) 171/88   Pulse: (!) 102  (!) 108   Resp:   (!) 21 19  Temp: 98.2 F (36.8 C)  97.8 F (36.6 C)   TempSrc: Oral  Oral   SpO2: 94% 96% 98%   Weight:      Height:        Intake/Output Summary (Last 24 hours) at 03/17/2018 1207 Last data filed at 03/17/2018 5329 Gross per 24 hour  Intake 3786.15 ml  Output 0 ml  Net 3786.15 ml   Filed Weights   03/15/18 2100 03/16/18 1417  Weight: 120.7 kg 120.7 kg   Exam  General: Well developed, well nourished, NAD, appears stated age  68: NCAT, mucous membranes moist.   Neck: Supple  Cardiovascular: S1 S2 auscultated, no rubs, murmurs or gallops. Regular rate and rhythm.  Respiratory: Diminished breath sounds, exp wheezing, cough  Abdomen: Soft, obese, nontender, nondistended, + bowel sounds  Extremities: warm dry without cyanosis clubbing or edema  Neuro: AAOx3, nonfocal  Psych: Appropriate mood and affect  Data Reviewed: I have personally reviewed following labs and imaging studies  CBC: Recent Labs  Lab 03/14/18 2011 03/15/18 0712 03/16/18 0623 03/17/18 0601  WBC 7.2 4.5  --   --   NEUTROABS 5.0  --   --   --   HGB 8.0* 8.5* 7.7* 7.8*  HCT 27.6* 28.5* 25.9* 25.6*  MCV 105.7* 107.1*  --   --   PLT 226 217  --   --    Basic Metabolic Panel: Recent Labs  Lab 03/14/18 2011 03/15/18 0712 03/16/18 0623 03/17/18 0601  NA 142 141 142 144  K 6.2* 7.4* 4.9 3.9  CL 112* 112* 117* 116*  CO2 20* 16*  19* 19*  GLUCOSE 223* 418* 386* 314*  BUN 70* 62* 42* 35*  CREATININE 3.00* 2.59* 1.81* 1.44*  CALCIUM 9.0 8.6* 8.5* 8.7*   GFR: Estimated Creatinine Clearance: 44.8 mL/min (A) (by C-G formula based on SCr of 1.44 mg/dL (H)). Liver Function Tests: Recent Labs  Lab 03/14/18 2011 03/16/18 9242 03/17/18 0601  AST 43* 71* 145*  ALT 46* 78* 145*  ALKPHOS 366* 405* 480*  BILITOT 0.6 0.6 0.7  PROT 5.8* 5.6* 5.6*  ALBUMIN 2.2* 2.3* 2.3*   No results for input(s): LIPASE, AMYLASE in the last 168 hours. No results for input(s): AMMONIA in the last 168 hours. Coagulation Profile: No results for input(s): INR, PROTIME in the last 168 hours. Cardiac Enzymes: No results for input(s): CKTOTAL, CKMB, CKMBINDEX, TROPONINI in the last 168 hours. BNP (last 3 results) No results for input(s): PROBNP in the last 8760 hours. HbA1C: No results for input(s): HGBA1C in the last 72 hours. CBG: Recent Labs  Lab 03/16/18 1110 03/16/18 1635 03/16/18 2148 03/17/18 0727 03/17/18 1107  GLUCAP 426* 323* 290* 282* 325*   Lipid Profile: No results for input(s): CHOL, HDL, LDLCALC, TRIG, CHOLHDL, LDLDIRECT in the last 72 hours. Thyroid Function Tests: No results for input(s): TSH, T4TOTAL, FREET4,  T3FREE, THYROIDAB in the last 72 hours. Anemia Panel: No results for input(s): VITAMINB12, FOLATE, FERRITIN, TIBC, IRON, RETICCTPCT in the last 72 hours. Urine analysis:    Component Value Date/Time   COLORURINE YELLOW 03/14/2018 2113   APPEARANCEUR CLEAR 03/14/2018 2113   LABSPEC 1.014 03/14/2018 2113   PHURINE 5.0 03/14/2018 2113   GLUCOSEU 50 (A) 03/14/2018 2113   HGBUR NEGATIVE 03/14/2018 2113   BILIRUBINUR NEGATIVE 03/14/2018 2113   KETONESUR NEGATIVE 03/14/2018 2113   PROTEINUR NEGATIVE 03/14/2018 2113   UROBILINOGEN 0.2 12/27/2010 1858   NITRITE NEGATIVE 03/14/2018 2113   LEUKOCYTESUR MODERATE (A) 03/14/2018 2113   Sepsis Labs: @LABRCNTIP (procalcitonin:4,lacticidven:4)  ) Recent  Results (from the past 240 hour(s))  Blood culture (routine x 2)     Status: None (Preliminary result)   Collection Time: 03/14/18  8:12 PM  Result Value Ref Range Status   Specimen Description BLOOD SITE NOT SPECIFIED  Final   Special Requests   Final    BOTTLES DRAWN AEROBIC AND ANAEROBIC Blood Culture adequate volume   Culture   Final    NO GROWTH 2 DAYS Performed at [facility], [place] [address], [place], [place] [address]    Report Status PENDING  Incomplete  Blood culture (routine x 2)     Status: None (Preliminary result)   Collection Time: 03/14/18  9:02 PM  Result Value Ref Range Status   Specimen Description BLOOD RIGHT ANTECUBITAL  Final   Special Requests   Final    BOTTLES DRAWN AEROBIC AND ANAEROBIC Blood Culture results may not be optimal due to an inadequate volume of blood received in culture bottles   Culture   Final    NO GROWTH 2 DAYS Performed at [facility], [place] [address], [place], [place] [address]    Report Status PENDING  Incomplete  Urine culture     Status: None   Collection Time: 03/14/18  9:13 PM  Result Value Ref Range Status   Specimen Description URINE, CATHETERIZED  Final   Special Requests   Final    NONE Performed at [facility], [place] [address], [place], [place] [address]    Culture NO GROWTH  Final   Report Status 03/16/2018 FINAL  Final      Radiology Studies: US Abdomen Limited Ruq  Result Date: 03/16/2018 CLINICAL DATA:  Elevated LFTs EXAM: ULTRASOUND ABDOMEN LIMITED RIGHT UPPER QUADRANT COMPARISON:  None. FINDINGS: Gallbladder: No gallstones or wall thickening visualized. No sonographic Murphy sign noted by sonographer. Common bile duct: Diameter: 4 mm Liver: Increased in echogenicity. No focal lesion identified portal vein is patent on color Doppler imaging with normal direction of blood flow towards the liver. IMPRESSION: Hepatic steatosis. No cholelithiasis or sonographic evidence for acute cholecystitis.  Electronically Signed   By: Lovey Newcomer M.D.   On: 03/16/2018 20:25     Scheduled Meds: . allopurinol  300 mg Oral Daily  . B-complex with vitamin C  1 tablet Oral Daily  . doxepin  25 mg Oral BID  . enoxaparin (LOVENOX) injection  30 mg Subcutaneous Q24H  . famotidine  20 mg Oral QHS  . folic acid  1 mg Oral Daily  . gabapentin  300 mg Oral TID  . hydrocerin  1 application Topical BID  . hydrocortisone cream  1 application Topical BID  . hydrOXYzine  25 mg Oral TID  . insulin aspart  0-20 Units Subcutaneous TID WC  . insulin aspart  0-5 Units Subcutaneous QHS  .  insulin glargine  20 Units Subcutaneous QHS  . ipratropium  0.5 mg Nebulization TID  . levalbuterol  1.25 mg Nebulization TID  . levofloxacin  750 mg Oral Daily  . loratadine  10 mg Oral Daily  . methylPREDNISolone (SOLU-MEDROL) injection  60 mg Intravenous Q8H  . pravastatin  40 mg Oral q1800  . risperiDONE  0.5 mg Oral BID  . sodium zirconium cyclosilicate  10 g Oral Daily  . vitamin B-6  25 mg Oral Daily  . vitamin C  500 mg Oral BID  . zinc sulfate  220 mg Oral Daily   Continuous Infusions: . sodium chloride 125 mL/hr at 03/17/18 0624     LOS: 2 days   Time Spent in minutes   30 minutes  Heather Petty D.O. on 03/17/2018 at 12:07 PM  Between 7am to 7pm - Please see pager noted on [email or web address]  After 7pm go to [email or web address]  And look for the night coverage person covering for me after hours  [facility] Office  [phone]

## 2018-03-18 ENCOUNTER — Inpatient Hospital Stay (HOSPITAL_COMMUNITY): Payer: Medicare Other

## 2018-03-18 DIAGNOSIS — E875 Hyperkalemia: Secondary | ICD-10-CM | POA: Diagnosis not present

## 2018-03-18 LAB — GLUCOSE, CAPILLARY
Glucose-Capillary: 192 mg/dL — ABNORMAL HIGH (ref 70–99)
Glucose-Capillary: 286 mg/dL — ABNORMAL HIGH (ref 70–99)
Glucose-Capillary: 283 mg/dL — ABNORMAL HIGH (ref 70–99)
Glucose-Capillary: 263 mg/dL — ABNORMAL HIGH (ref 70–99)

## 2018-03-18 LAB — COMPREHENSIVE METABOLIC PANEL
ALT: 136 U/L — ABNORMAL HIGH (ref 0–44)
AST: 94 U/L — ABNORMAL HIGH (ref 15–41)
Albumin: 2.2 g/dL — ABNORMAL LOW (ref 3.5–5.0)
Alkaline Phosphatase: 415 U/L — ABNORMAL HIGH (ref 38–126)
Anion gap: 4 — ABNORMAL LOW (ref 5–15)
BUN: 31 mg/dL — ABNORMAL HIGH (ref 8–23)
CO2: 20 mmol/L — ABNORMAL LOW (ref 22–32)
Calcium: 8.4 mg/dL — ABNORMAL LOW (ref 8.9–10.3)
Chloride: 120 mmol/L — ABNORMAL HIGH (ref 98–111)
Creatinine, Ser: 1.25 mg/dL — ABNORMAL HIGH (ref 0.44–1.00)
GFR calc Af Amer: 51 mL/min — ABNORMAL LOW (ref >60)
GFR calc non Af Amer: 44 mL/min — ABNORMAL LOW (ref >60)
Glucose, Bld: 225 mg/dL — ABNORMAL HIGH (ref 70–99)
Potassium: 4 mmol/L (ref 3.5–5.1)
Sodium: 144 mmol/L (ref 135–145)
Total Bilirubin: 0.7 mg/dL (ref 0.3–1.2)
Total Protein: 5.3 g/dL — ABNORMAL LOW (ref 6.5–8.1)

## 2018-03-18 LAB — HEMOGLOBIN AND HEMATOCRIT, BLOOD
HEMATOCRIT: 25.9 % — AB (ref 36.0–46.0)
Hemoglobin: 7.7 g/dL — ABNORMAL LOW (ref 12.0–15.0)

## 2018-03-18 LAB — PREPARE RBC (CROSSMATCH)

## 2018-03-18 MED ORDER — AMLODIPINE BESYLATE 10 MG PO TABS
10.0000 mg | ORAL_TABLET | Freq: Every day | ORAL | Status: DC
  Administered 2018-03-18 – 2018-03-20 (×3): 10 mg via ORAL
  Filled 2018-03-18 (×3): qty 1

## 2018-03-18 MED ORDER — SODIUM CHLORIDE 0.9% IV SOLUTION: Freq: Once | INTRAVENOUS | Status: DC

## 2018-03-18 MED ORDER — DM-GUAIFENESIN ER 30-600 MG PO TB12
1.0000 | ORAL_TABLET | Freq: Two times a day (BID) | ORAL | Status: DC
  Administered 2018-03-18 – 2018-03-20 (×5): 1 via ORAL
  Filled 2018-03-18 (×5): qty 1

## 2018-03-18 MED ORDER — LEVOFLOXACIN 750 MG PO TABS
750.0000 mg | ORAL_TABLET | Freq: Every day | ORAL | Status: DC
  Administered 2018-03-18 – 2018-03-20 (×3): 750 mg via ORAL
  Filled 2018-03-18 (×3): qty 1

## 2018-03-18 MED ORDER — METHYLPREDNISOLONE SODIUM SUCC 125 MG IJ SOLR
60.0000 mg | Freq: Four times a day (QID) | INTRAMUSCULAR | Status: DC
  Administered 2018-03-18 – 2018-03-20 (×7): 60 mg via INTRAVENOUS
  Filled 2018-03-18 (×7): qty 2

## 2018-03-18 NOTE — Progress Notes (Signed)
Inpatient Diabetes Program Recommendations  AACE/ADA: New Consensus Statement on Inpatient Glycemic Control (2015)  Target Ranges:  Prepandial:   less than 140 mg/dL      Peak postprandial:   less than 180 mg/dL (1-2 hours)      Critically ill patients:  140 - 180 mg/dL   Lab Results  Component Value Date   GLUCAP 192 (H) 03/18/2018   HGBA1C 6.7 (H) 12/18/2017    Review of Glycemic Control Results for NIYA, BEHLER (MRN [id number]) as of 03/18/2018 10:54  Ref. Range 03/17/2018 11:07 03/17/2018 16:42 03/17/2018 20:46 03/18/2018 07:25  Glucose-Capillary Latest Ref Range: 70 - 99 mg/dL 325 (H) 285 (H) 250 (H) 192 (H)   DM2  Home DM meds: Novolog SSI  Ac&hs (currently in a SNF)                             Lantus 20 units q hs  Current inpatient DM meds: Novolog resistant (0-20 units) tid & HS                                               Lantus 20 units q hs Solumedrol 60 mg Q12H  Recommendations:  May want to consider adding meal coverage for post prandial elevations in the setting of steroids. Consider adding Novolog 6 units TID (asusming that patient is consuming >50% of meals, anticipate needs to decrease once steroids are tapered).  Thanks, Bronson Curb, MSN, RNC-OB Diabetes Coordinator [phone] (8a-5p)

## 2018-03-18 NOTE — Progress Notes (Signed)
PHARMACY NOTE:  ANTIMICROBIAL RENAL DOSAGE ADJUSTMENT  Current antimicrobial regimen includes a mismatch between antimicrobial dosage and estimated renal function.  As per policy approved by the Pharmacy & Therapeutics and Medical Executive Committees, the antimicrobial dosage will be adjusted accordingly.  Current antimicrobial dosage: levofloxacin 750mg  PO q48h  Indication: Pneumonia  Renal Function:  Estimated Creatinine Clearance: 51.6 mL/min (A) (by C-G formula based on SCr of 1.25 mg/dL (H)). []      On intermittent HD, scheduled: []      On CRRT    Antimicrobial dosage has been changed to:  Levofloxacin 750mg  PO q24h  Additional comments:   Marjon Doxtater A. Levada Dy, PharmD, [place] Pager: [phone] Please utilize Amion for appropriate phone number to reach the unit pharmacist ([place])   03/18/2018 10:22 AM
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
## 2018-03-18 NOTE — Care Management Note (Signed)
Case Management Note Manya Silvas, RN MSN CCM Transitions of Care [address] [place] [phone]  Patient Details  Name: Nalany Steedley MRN: [id number] Date of Birth: 1950-11-28  Subjective/Objective:      Hyperkalemia             Action/Plan: PTA from SNF. Pt not wanting to return to SNF. Asking to go home with granddaughter. PT/OT consult requested. Referral made to [facility] for Home First Program-pending acceptance. CM to follow for transition of care needs.   Expected Discharge Date:  03/19/18               Expected Discharge Plan:  Skilled Nursing Facility  In-House Referral:  Clinical Social Work  Discharge planning Services  CM Consult  Post Acute Care Choice:    Choice offered to:     DME Arranged:    DME Agency:     HH Arranged:    [place] Agency:     Status of Service:  In process, will continue to follow  If discussed at Long Length of Stay Meetings, dates discussed:    Additional Comments: 03/18/2018-Received call from [facility]. Pt is not a candidate for Home First. CSW in contact with daughter to arrange return to snf. CM to follow for transition of care needs.   Bartholomew Crews, RN 03/18/2018, 12:38 PM

## 2018-03-18 NOTE — Care Management Note (Signed)
Case Management Note Manya Silvas, RN MSN CCM Transitions of Care [address] [place] [phone]  Patient Details  Name: Monia Timmers MRN: [id number] Date of Birth: September 17, 1950  Subjective/Objective:      Hyperkalemia             Action/Plan: PTA from SNF. Pt not wanting to return to SNF. Asking to go home with granddaughter. PT/OT consult requested. Referral made to [facility] for Home First Program-pending acceptance. CM to follow for transition of care needs.   Expected Discharge Date:  03/19/18               Expected Discharge Plan:  Skilled Nursing Facility  In-House Referral:  Clinical Social Work  Discharge planning Services  CM Consult  Post Acute Care Choice:    Choice offered to:     DME Arranged:    DME Agency:     HH Arranged:    [place] Agency:     Status of Service:  In process, will continue to follow  If discussed at Long Length of Stay Meetings, dates discussed:    Additional Comments:  Bartholomew Crews, RN 03/18/2018, 10:13 AM

## 2018-03-18 NOTE — Progress Notes (Signed)
Patient refused dressing change tonight. Patient made aware that it's supposed to be 2x/day. Will attempt in the am. Mingo Amber, RN

## 2018-03-18 NOTE — Progress Notes (Signed)
PROGRESS NOTE    Heather Petty  HQR:[id number] DOB: [birth year] DOA: 03/14/2018 PCP: [name], MD   Brief Narrative:67 y.o.femalewith medical history significant ofhyperlipidemia, diabetes mellitus, asthma, GERD, gout, depression, schizophrenia, morbid obesity, CKD 3, who presents with abnormal lab with hyperkalemia.  Patient was found to have hyperkalemia in the facility, with potassium of 6.4, therefore was transferred to ED for further evaluation and treatment. Patient states that she has cough, shortness of breath and wheezing, stuffy nose. No fever or chills. Denies any chest pain. No nausea vomiting, diarrhea, abdominal pain. She has mild dysuria, no burning on urination. Denies any unilateral weakness. Patient states that she feels verythirsty,asking for water. Initially patient was hypotensive with blood pressure 74/35, which improved to 107/57 after giving 2 L normal saline bolus in ED.  Assessment & Plan:   Principal Problem:   Hyperkalemia Active Problems:   Gout   Type II diabetes mellitus with renal manifestations (HCC)   HTN (hypertension)   Hyperlipidemia associated with type 2 diabetes mellitus (HCC)   Pressure injury of skin   Asthma exacerbation   Hypotension   Lactic acidosis   GERD (gastroesophageal reflux disease)   Acute renal failure superimposed on stage 3 chronic kidney disease (HCC)   Macrocytic anemia   Abnormal LFTs  Hyperkalemia -Upon admission, potassium was 6.2 -Patient was given potassium chloride at her nursing facility -She was given NovoLog 5 units in the emergency department along with Kayexalate -Despite treatment, potassium continued rose to 7.4. Given lokelma,, calcium gluconate, insulin  -Potassium now down to 4 -continue to monitor BMP  Asthma Exacerbation -Chest x-ray reviewed and unremarkable for infection -Patient presented with wheezing and rhonchi bilaterally -Continue nebulizer treatments, Solu-Medrol, Mucinex  (will continue to wean solumedrol) -Influenza PCR unremarkable -repeat chest xray  Hypotension and lactic acidosis -Patient presented with hypotension, blood pressure 74/35 which improved after IV fluids.  Her lactic acid was also elevated but that has since trended downward into normal range. -Patient currently afebrile with no leukocytosis -He did appear dry on physical examination -UA as well as chest x-ray unremarkable for infection -Patient was receiving Levaquin at the nursing facility for suspected pneumonia which has been continued  Acute kidney injury on chronic kidney disease, stage III -Baseline creatinine approximately 1, creatinine on admission was 3 -Lotensin and Lasix held -Possibly worsened with hypotension vs medications  -Creatinine down to 1.81 -Continue IV fluid, monitor BMP  Abnormal LFTs -Patient has had a negative hepatitis panel, although was positive for hep A IgM in November 2019 -RUQ ultrasound: Hepatic steatosis.  No cholelithiasis or sonographic evidence for acute cholecystitis. -Continue IVF -Discussed with gastroenterology, given that patient has baseline insult to her liver, acute illness may certainly elevate her LFTs.  Would recheck in 1 week.  No further work-up necessary at this time. -Continue to monitor  Diabetes mellitus, type II with hyperglycemia -Last hemoglobin A1c 6.7 on 12/18/2016 -Patient currently receiving Solu-Medrol for asthma exacerbation, blood sugars have been uncontrolled -continue to wean Solu-Medrol -Continue lantus, increased ISS to resistent -continue CBG monitoring   Essential hypertension Norvasc restarted - Hyperlipidemia -Continue statin  GERD -Continue Pepcid  Chronic macrocytic anemia -Baseline hemoglobin 8-9, currently 7.7(likely dilutional component) -We will transfuse 1 unit of packed RBC today.  Gout -Continue allopurinol  DVT Prophylaxis  Lovenox  Code Status: Full  Family Communication:  Discussed with daughter over the phone   Pressure Injury 03/01/18 Stage II -  Partial thickness loss of dermis presenting as a shallow open ulcer  with a red, pink wound bed without slough. (Active)  03/01/18 0052  Location: Buttocks  Location Orientation: Left  Staging: Stage II -  Partial thickness loss of dermis presenting as a shallow open ulcer with a red, pink wound bed without slough.  Wound Description (Comments):   Present on Admission:      Pressure Injury 03/01/18 Stage II -  Partial thickness loss of dermis presenting as a shallow open ulcer with a red, pink wound bed without slough. (Active)  03/01/18 0056  Location: Knee  Location Orientation: Left;Posterior  Staging: Stage II -  Partial thickness loss of dermis presenting as a shallow open ulcer with a red, pink wound bed without slough.  Wound Description (Comments):   Present on Admission: Yes     Pressure Injury 03/01/18 Stage II -  Partial thickness loss of dermis presenting as a shallow open ulcer with a red, pink wound bed without slough. (Active)  03/01/18 0103  Location: Sacrum  Location Orientation: Right  Staging: Stage II -  Partial thickness loss of dermis presenting as a shallow open ulcer with a red, pink wound bed without slough.  Wound Description (Comments):   Present on Admission:      Pressure Injury 03/01/18 Stage II -  Partial thickness loss of dermis presenting as a shallow open ulcer with a red, pink wound bed without slough. (Active)  03/01/18 0105  Location: Back  Location Orientation: Left;Right  Staging: Stage II -  Partial thickness loss of dermis presenting as a shallow open ulcer with a red, pink wound bed without slough.  Wound Description (Comments):   Present on Admission:      Pressure Injury 03/01/18 Stage II -  Partial thickness loss of dermis presenting as a shallow open ulcer with a red, pink wound bed without slough. (Active)  03/01/18 0108  Location: Knee  Location  Orientation: Posterior;Right  Staging: Stage II -  Partial thickness loss of dermis presenting as a shallow open ulcer with a red, pink wound bed without slough.  Wound Description (Comments):   Present on Admission: Yes     Pressure Injury 03/01/18 Stage II -  Partial thickness loss of dermis presenting as a shallow open ulcer with a red, pink wound bed without slough. (Active)  03/01/18 0110  Location: Leg  Location Orientation: Posterior;Proximal;Right  Staging: Stage II -  Partial thickness loss of dermis presenting as a shallow open ulcer with a red, pink wound bed without slough.  Wound Description (Comments):   Present on Admission:      Pressure Injury 03/15/18 Stage II -  Partial thickness loss of dermis presenting as a shallow open ulcer with a red, pink wound bed without slough. (Active)  03/15/18 0030  Location: Knee  Location Orientation: Right;Posterior  Staging: Stage II -  Partial thickness loss of dermis presenting as a shallow open ulcer with a red, pink wound bed without slough.  Wound Description (Comments):   Present on Admission: Yes     Pressure Injury 03/15/18 Stage III -  Full thickness tissue loss. Subcutaneous fat may be visible but bone, tendon or muscle are NOT exposed. (Active)  03/15/18 0030  Location: Buttocks  Location Orientation: Right  Staging: Stage III -  Full thickness tissue loss. Subcutaneous fat may be visible but bone, tendon or muscle are NOT exposed.  Wound Description (Comments):   Present on Admission: Yes     Pressure Injury 03/15/18 Stage III -  Full thickness tissue loss. Subcutaneous fat  may be visible but bone, tendon or muscle are NOT exposed. (Active)  03/15/18 0030  Location: Buttocks  Location Orientation: Left  Staging: Stage III -  Full thickness tissue loss. Subcutaneous fat may be visible but bone, tendon or muscle are NOT exposed.  Wound Description (Comments):   Present on Admission: Yes     Pressure Injury 03/15/18  Unstageable - Full thickness tissue loss in which the base of the ulcer is covered by slough (yellow, tan, gray, green or brown) and/or eschar (tan, brown or black) in the wound bed. 3 unstageable wounds on Left buttocks (Active)  03/15/18 0030  Location: Buttocks  Location Orientation: Left  Staging: Unstageable - Full thickness tissue loss in which the base of the ulcer is covered by slough (yellow, tan, gray, green or brown) and/or eschar (tan, brown or black) in the wound bed.  Wound Description (Comments): 3 unstageable wounds on Left buttocks  Present on Admission: Yes     Pressure Injury 03/15/18 Stage III -  Full thickness tissue loss. Subcutaneous fat may be visible but bone, tendon or muscle are NOT exposed. (Active)  03/15/18 0030  Location: Buttocks  Location Orientation: Left;Distal;Lower  Staging: Stage III -  Full thickness tissue loss. Subcutaneous fat may be visible but bone, tendon or muscle are NOT exposed.  Wound Description (Comments):   Present on Admission: Yes       Estimated body mass index is 50.28 kg/m as calculated from the following:   Height as of this encounter: 5' 1"  (1.549 m).   Weight as of this encounter: 120.7 kg.   Subjective:   Objective: Vitals:   03/18/18 0827 03/18/18 0851 03/18/18 1250 03/18/18 1347  BP:  (!) 184/82 (!) 159/76   Pulse:  (!) 108 (!) 102   Resp:  18    Temp:  97.9 F (36.6 C)    TempSrc:  Oral    SpO2: 96% 96%  96%  Weight:      Height:        Intake/Output Summary (Last 24 hours) at 03/18/2018 1431 Last data filed at 03/18/2018 1225 Gross per 24 hour  Intake 3945.83 ml  Output 1250 ml  Net 2695.83 ml   Filed Weights   03/15/18 2100 03/16/18 1417 03/17/18 2047  Weight: 120.7 kg 120.7 kg 120.7 kg    Examination:  General exam: Appears calm and comfortable  Respiratory system: Clear to auscultation. Respiratory effort normal. Cardiovascular system: S1 & S2 heard, RRR. No JVD, murmurs, rubs, gallops or  clicks. No pedal edema. Gastrointestinal system: Abdomen is nondistended, soft and nontender. No organomegaly or masses felt. Normal bowel sounds heard. Central nervous system: Alert and oriented. No focal neurological deficits. Extremities: Symmetric 5 x 5 power. Skin: No rashes, lesions or ulcers Psychiatry: Judgement and insight appear normal. Mood & affect appropriate.     Data Reviewed: I have personally reviewed following labs and imaging studies  CBC: Recent Labs  Lab 03/14/18 2011 03/15/18 4536 03/16/18 0623 03/17/18 0601 03/18/18 0436  WBC 7.2 4.5  --   --   --   NEUTROABS 5.0  --   --   --   --   HGB 8.0* 8.5* 7.7* 7.8* 7.7*  HCT 27.6* 28.5* 25.9* 25.6* 25.9*  MCV 105.7* 107.1*  --   --   --   PLT 226 217  --   --   --    Basic Metabolic Panel: Recent Labs  Lab 03/14/18 2011 03/15/18 4680 03/16/18 0623 03/17/18  0601 03/18/18 0436  NA 142 141 142 144 144  K 6.2* 7.4* 4.9 3.9 4.0  CL 112* 112* 117* 116* 120*  CO2 20* 16* 19* 19* 20*  GLUCOSE 223* 418* 386* 314* 225*  BUN 70* 62* 42* 35* 31*  CREATININE 3.00* 2.59* 1.81* 1.44* 1.25*  CALCIUM 9.0 8.6* 8.5* 8.7* 8.4*   GFR: Estimated Creatinine Clearance: 51.6 mL/min (A) (by C-G formula based on SCr of 1.25 mg/dL (H)). Liver Function Tests: Recent Labs  Lab 03/14/18 2011 03/16/18 1761 03/17/18 0601 03/18/18 0436  AST 43* 71* 145* 94*  ALT 46* 78* 145* 136*  ALKPHOS 366* 405* 480* 415*  BILITOT 0.6 0.6 0.7 0.7  PROT 5.8* 5.6* 5.6* 5.3*  ALBUMIN 2.2* 2.3* 2.3* 2.2*   No results for input(s): LIPASE, AMYLASE in the last 168 hours. No results for input(s): AMMONIA in the last 168 hours. Coagulation Profile: No results for input(s): INR, PROTIME in the last 168 hours. Cardiac Enzymes: No results for input(s): CKTOTAL, CKMB, CKMBINDEX, TROPONINI in the last 168 hours. BNP (last 3 results) No results for input(s): PROBNP in the last 8760 hours. HbA1C: No results for input(s): HGBA1C in the last 72  hours. CBG: Recent Labs  Lab 03/17/18 1107 03/17/18 1642 03/17/18 2046 03/18/18 0725 03/18/18 1116  GLUCAP 325* 285* 250* 192* 286*   Lipid Profile: No results for input(s): CHOL, HDL, LDLCALC, TRIG, CHOLHDL, LDLDIRECT in the last 72 hours. Thyroid Function Tests: No results for input(s): TSH, T4TOTAL, FREET4, T3FREE, THYROIDAB in the last 72 hours. Anemia Panel: No results for input(s): VITAMINB12, FOLATE, FERRITIN, TIBC, IRON, RETICCTPCT in the last 72 hours. Sepsis Labs: Recent Labs  Lab 03/14/18 2012 03/15/18 0309 03/15/18 0315 03/15/18 6073  PROCALCITON  --  0.15  --   --   LATICACIDVEN 2.2*  --  2.4* 1.6    Recent Results (from the past 240 hour(s))  Blood culture (routine x 2)     Status: None (Preliminary result)   Collection Time: 03/14/18  8:12 PM  Result Value Ref Range Status   Specimen Description BLOOD SITE NOT SPECIFIED  Final   Special Requests   Final    BOTTLES DRAWN AEROBIC AND ANAEROBIC Blood Culture adequate volume   Culture   Final    NO GROWTH 3 DAYS Performed at [facility], [place] [address] [place], [place] [address]    Report Status PENDING  Incomplete  Blood culture (routine x 2)     Status: None (Preliminary result)   Collection Time: 03/14/18  9:02 PM  Result Value Ref Range Status   Specimen Description BLOOD RIGHT ANTECUBITAL  Final   Special Requests   Final    BOTTLES DRAWN AEROBIC AND ANAEROBIC Blood Culture results may not be optimal due to an inadequate volume of blood received in culture bottles   Culture   Final    NO GROWTH 3 DAYS Performed at [facility], [place] [address], [place], [place] [address]    Report Status PENDING  Incomplete  Urine culture     Status: None   Collection Time: 03/14/18  9:13 PM  Result Value Ref Range Status   Specimen Description URINE, CATHETERIZED  Final   Special Requests   Final    NONE Performed at [facility], [place] [address] [place], [place] [address]    Culture NO  GROWTH  Final   Report Status 03/16/2018 FINAL  Final  Radiology Studies: US Abdomen Limited Ruq  Result Date: 03/16/2018 CLINICAL DATA:  Elevated LFTs EXAM: ULTRASOUND ABDOMEN LIMITED RIGHT UPPER QUADRANT COMPARISON:  None. FINDINGS: Gallbladder: No gallstones or wall thickening visualized. No sonographic Murphy sign noted by sonographer. Common bile duct: Diameter: 4 mm Liver: Increased in echogenicity. No focal lesion identified portal vein is patent on color Doppler imaging with normal direction of blood flow towards the liver. IMPRESSION: Hepatic steatosis. No cholelithiasis or sonographic evidence for acute cholecystitis. Electronically Signed   By: Lovey Newcomer M.D.   On: 03/16/2018 20:25        Scheduled Meds: . allopurinol  300 mg Oral Daily  . B-complex with vitamin C  1 tablet Oral Daily  . dextromethorphan-guaiFENesin  1 tablet Oral BID  . doxepin  25 mg Oral BID  . enoxaparin (LOVENOX) injection  40 mg Subcutaneous Q24H  . famotidine  20 mg Oral QHS  . folic acid  1 mg Oral Daily  . gabapentin  300 mg Oral TID  . hydrocerin  1 application Topical BID  . hydrocortisone cream  1 application Topical BID  . hydrOXYzine  25 mg Oral TID  . insulin aspart  0-20 Units Subcutaneous TID WC  . insulin aspart  0-5 Units Subcutaneous QHS  . insulin glargine  20 Units Subcutaneous QHS  . ipratropium  0.5 mg Nebulization TID  . levalbuterol  1.25 mg Nebulization TID  . levofloxacin  750 mg Oral Daily  . loratadine  10 mg Oral Daily  . methylPREDNISolone (SOLU-MEDROL) injection  60 mg Intravenous Q6H  . pravastatin  40 mg Oral q1800  . risperiDONE  0.5 mg Oral BID  . sodium zirconium cyclosilicate  10 g Oral Daily  . vitamin B-6  25 mg Oral Daily  . vitamin C  500 mg Oral BID  . zinc sulfate  220 mg Oral Daily   Continuous Infusions: . sodium chloride 125 mL/hr at 03/17/18 1540     LOS: 3 days    Georgette Shell, MD [facility]  If 7PM-7AM, please  contact night-coverage [email or web address] Password [facility] 03/18/2018, 2:31 PM

## 2018-03-19 DIAGNOSIS — E875 Hyperkalemia: Secondary | ICD-10-CM | POA: Diagnosis not present

## 2018-03-19 DIAGNOSIS — J4541 Moderate persistent asthma with (acute) exacerbation: Secondary | ICD-10-CM | POA: Diagnosis not present

## 2018-03-19 DIAGNOSIS — N179 Acute kidney failure, unspecified: Secondary | ICD-10-CM | POA: Diagnosis not present

## 2018-03-19 DIAGNOSIS — R945 Abnormal results of liver function studies: Secondary | ICD-10-CM | POA: Diagnosis not present

## 2018-03-19 LAB — CULTURE, BLOOD (ROUTINE X 2)
Culture: NO GROWTH
Culture: NO GROWTH
Special Requests: ADEQUATE

## 2018-03-19 LAB — CBC WITH DIFFERENTIAL/PLATELET
Abs Immature Granulocytes: 0.29 10*3/uL — ABNORMAL HIGH (ref 0.00–0.07)
Basophils Absolute: 0 10*3/uL (ref 0.0–0.1)
Basophils Relative: 0 %
EOS ABS: 0 10*3/uL (ref 0.0–0.5)
Eosinophils Relative: 0 %
HCT: 33 % — ABNORMAL LOW (ref 36.0–46.0)
HEMOGLOBIN: 10.5 g/dL — AB (ref 12.0–15.0)
Immature Granulocytes: 4 %
Lymphocytes Relative: 11 %
Lymphs Abs: 0.9 10*3/uL (ref 0.7–4.0)
MCH: 30.3 pg (ref 26.0–34.0)
MCHC: 31.8 g/dL (ref 30.0–36.0)
MCV: 95.4 fL (ref 80.0–100.0)
Monocytes Absolute: 0.3 10*3/uL (ref 0.1–1.0)
Monocytes Relative: 3 %
NEUTROS PCT: 82 %
Neutro Abs: 6.3 10*3/uL (ref 1.7–7.7)
Platelets: 148 10*3/uL — ABNORMAL LOW (ref 150–400)
RBC: 3.46 MIL/uL — ABNORMAL LOW (ref 3.87–5.11)
RDW: 17.8 % — ABNORMAL HIGH (ref 11.5–15.5)
WBC: 7.8 10*3/uL (ref 4.0–10.5)
nRBC: 0.8 % — ABNORMAL HIGH (ref 0.0–0.2)

## 2018-03-19 LAB — BPAM RBC
BLOOD PRODUCT EXPIRATION DATE: 202002262359
ISSUE DATE / TIME: 202002101735
Unit Type and Rh: 9500

## 2018-03-19 LAB — BASIC METABOLIC PANEL
Anion gap: 9 (ref 5–15)
BUN: 31 mg/dL — ABNORMAL HIGH (ref 8–23)
CO2: 18 mmol/L — ABNORMAL LOW (ref 22–32)
Calcium: 8.8 mg/dL — ABNORMAL LOW (ref 8.9–10.3)
Chloride: 114 mmol/L — ABNORMAL HIGH (ref 98–111)
Creatinine, Ser: 1.19 mg/dL — ABNORMAL HIGH (ref 0.44–1.00)
GFR calc Af Amer: 54 mL/min — ABNORMAL LOW (ref >60)
GFR calc non Af Amer: 47 mL/min — ABNORMAL LOW (ref >60)
Glucose, Bld: 270 mg/dL — ABNORMAL HIGH (ref 70–99)
Potassium: 4.1 mmol/L (ref 3.5–5.1)
Sodium: 141 mmol/L (ref 135–145)

## 2018-03-19 LAB — GLUCOSE, CAPILLARY
Glucose-Capillary: 242 mg/dL — ABNORMAL HIGH (ref 70–99)
Glucose-Capillary: 348 mg/dL — ABNORMAL HIGH (ref 70–99)
Glucose-Capillary: 290 mg/dL — ABNORMAL HIGH (ref 70–99)
Glucose-Capillary: 311 mg/dL — ABNORMAL HIGH (ref 70–99)

## 2018-03-19 LAB — TYPE AND SCREEN
ABO/RH(D): O NEG
Antibody Screen: NEGATIVE
Unit division: 0

## 2018-03-19 MED ORDER — BENAZEPRIL HCL 40 MG PO TABS
20.0000 mg | ORAL_TABLET | Freq: Every day | ORAL | Status: DC
  Administered 2018-03-19: 20 mg via ORAL
  Filled 2018-03-19 (×2): qty 1

## 2018-03-19 MED ORDER — FUROSEMIDE 20 MG PO TABS
20.0000 mg | ORAL_TABLET | Freq: Every day | ORAL | Status: DC
  Administered 2018-03-19 – 2018-03-20 (×2): 20 mg via ORAL
  Filled 2018-03-19 (×2): qty 1

## 2018-03-19 NOTE — Care Management Important Message (Signed)
Important Message  Patient Details  Name: Heather Petty MRN: [id number] Date of Birth: 1950/11/11   Medicare Important Message Given:  Yes    [address] 03/19/2018, 3:45 PM

## 2018-03-19 NOTE — Progress Notes (Signed)
Inpatient Diabetes Program Recommendations  AACE/ADA: New Consensus Statement on Inpatient Glycemic Control (2015)  Target Ranges:  Prepandial:   less than 140 mg/dL      Peak postprandial:   less than 180 mg/dL (1-2 hours)      Critically ill patients:  140 - 180 mg/dL   Lab Results  Component Value Date   GLUCAP 242 (H) 03/19/2018   HGBA1C 6.7 (H) 12/18/2017    Review of Glycemic Control Results for MELISSSA, DONNER (MRN [id number]) as of 03/19/2018 11:01  Ref. Range 03/18/2018 11:16 03/18/2018 16:20 03/18/2018 20:45 03/19/2018 07:33  Glucose-Capillary Latest Ref Range: 70 - 99 mg/dL 286 (H) 283 (H) 263 (H) 242 (H)   DM2  Home DM meds: Novolog SSIAc&hs (currently in a SNF) Lantus 20 units q hs  Current inpatient DM meds: Novolog resistant (0-20 units) tid & HS Lantus 20 units q hs Solumedrol 60 mg Q12H  Recommendations:  If to remain inpatient:   Consider adding Novolog 6 units TID (asusming that patient is consuming >50% of meals, anticipate needs to decrease once steroids are tapered).  Additionally, consider increasing Lantus to 24 units QHS.   Thanks, Bronson Curb, MSN, RNC-OB Diabetes Coordinator [phone] (8a-5p)

## 2018-03-19 NOTE — Progress Notes (Signed)
PROGRESS NOTE    Heather Petty  VHQ:[id number] DOB: May 20, 1950 DOA: 03/14/2018 PCP: [name], MD   Brief Narrative:  HPI on 03/14/2017 by Dr. Ivor Costa Heather Petty is a 68 y.o. female with medical history significant of hyperlipidemia, diabetes mellitus, asthma, GERD, gout, depression, schizophrenia, morbid obesity, CKD 3, who presents with abnormal lab with hyperkalemia.  Patient was found to have hyperkalemia in the facility, with potassium of 6.4, therefore was transferred to ED for further evaluation and treatment.  Patient states that she has cough, shortness of breath and wheezing, stuffy nose.  No fever or chills.  Denies any chest pain.  No nausea vomiting, diarrhea, abdominal pain.  She has mild dysuria, no burning on urination.  Denies any unilateral weakness.  Patient states that she feels very thirsty, asking for water.  Initially patient was hypotensive with blood pressure 74/35, which improved to 107/57 after giving 2 L normal saline bolus in ED. Assessment & Plan   Hyperkalemia -Upon admission, potassium was 6.2 -Patient was given potassium chloride at her nursing facility -She was given NovoLog 5 units in the emergency department along with Kayexalate -Despite treatment, potassium continued rose to 7.4. Given lokelma, calcium gluconate, insulin  -Potassium now 4.1 -continue to monitor BMP  Asthma Exacerbation/ Pneumonia -Chest x-ray reviewed and unremarkable for infection -Patient presented with wheezing and rhonchi bilaterally -Continue nebulizer treatments, Solu-Medrol, Mucinex (will continue to wean solumedrol) -Influenza PCR unremarkable -Repeat chest x-ray to 11/26/2018 showed cardiomegaly, vascular congestion.  Small to moderate pleural effusion with right lower lobe atelectasis or pneumonia -Patient started on Levaquin -currently patient afebrile with no leukocytosis  Hypotension and lactic acidosis -Patient presented with hypotension, blood pressure  74/35 which improved after IV fluids.  Her lactic acid was also elevated but that has since trended downward into normal range. -Patient currently afebrile with no leukocytosis -He did appear dry on physical examination -UA as well as chest x-ray unremarkable for infection -Patient was receiving Levaquin at the nursing facility for suspected pneumonia which has been continued  Acute kidney injury on chronic kidney disease, stage III -Baseline creatinine approximately 1, creatinine on admission was 3 -Possibly worsened with hypotension vs medications -Lotensin and Lasix held- will restart today and monitor  -Creatinine down to 1.19 -Continue to monitor BMP  Abnormal LFTs -Patient has had a negative hepatitis panel, although was positive for hep A IgM in November 2019 -RUQ ultrasound: Hepatic steatosis.  No cholelithiasis or sonographic evidence for acute cholecystitis. -Continue IVF -Discussed with gastroenterology, given that patient has baseline insult to her liver, acute illness may certainly elevate her LFTs.  Would recheck in 1 week.  No further work-up necessary at this time. -Continue to monitor   Diabetes mellitus, type II with hyperglycemia -Last hemoglobin A1c 6.7 on 12/18/2016 -Patient currently receiving Solu-Medrol for asthma exacerbation, blood sugars have been uncontrolled -continue to wean Solu-Medrol -Continue lantus, increased ISS to resistent -continue CBG monitoring   Essential hypertension -Home medications were initially held as patient presented with hypotension -norvasc restarted -Continue to monitor  Hyperlipidemia -Continue statin  GERD -Continue Pepcid  Chronic macrocytic anemia -Baseline hemoglobin 8-9, dropped to 7.7 (likely dilutional component) -was transfused 1u PRBC -repeat hemoglobin 10.5 today -Continue to monitor CBC  Gout -Continue allopurinol  DVT Prophylaxis  Lovenox  Code Status: Full  Family Communication: None at  bedside  Disposition Plan: Admitted.  Patient now agreeing to SNF placement.  Social work consulted.  Consultants GI via phone  Procedures  None  Antibiotics  Anti-infectives (From admission, onward)   Start     Dose/Rate Route Frequency Ordered Stop   03/20/18 1000  levofloxacin (LEVAQUIN) tablet 750 mg  Status:  Discontinued    Note to Pharmacy:  FOR 14 DAYS     750 mg Oral Every 48 hours 03/17/18 1313 03/17/18 1314   03/19/18 1000  levofloxacin (LEVAQUIN) tablet 750 mg  Status:  Discontinued    Note to Pharmacy:  FOR 14 DAYS     750 mg Oral Every 48 hours 03/17/18 1314 03/18/18 1021   03/18/18 1030  levofloxacin (LEVAQUIN) tablet 750 mg    Note to Pharmacy:  FOR 14 DAYS     750 mg Oral Daily 03/18/18 1021 03/26/18 0959   03/15/18 1000  levofloxacin (LEVAQUIN) tablet 750 mg  Status:  Discontinued    Note to Pharmacy:  FOR 14 DAYS     750 mg Oral Daily 03/14/18 2314 03/17/18 1313      Subjective:   [name] seen and examined today.  Patient continues to have cough but feels it is improved prior to admission.  Denies current chest pain, shortness of breath, abdominal pain, nausea or vomiting, diarrhea or constipation, dizziness or headache.  Agrees to go to SNF now.  Objective:   Vitals:   03/18/18 2205 03/19/18 0510 03/19/18 0839 03/19/18 0852  BP:  (!) 169/82  (!) 170/78  Pulse:  89  99  Resp:  17  18  Temp:  97.6 F (36.4 C)  97.9 F (36.6 C)  TempSrc:  Oral  Oral  SpO2: 97% 97% 96% 94%  Weight:      Height:        Intake/Output Summary (Last 24 hours) at 03/19/2018 1250 Last data filed at 03/19/2018 1231 Gross per 24 hour  Intake 1602 ml  Output 625 ml  Net 977 ml   Filed Weights   03/16/18 1417 03/17/18 2047 03/18/18 2041  Weight: 120.7 kg 120.7 kg 130 kg   Exam  General: Well developed, well nourished, NAD, appears stated age  73: NCAT, mucous membranes moist.   Neck: Supple  Cardiovascular: S1 S2 auscultated, RRR, no murmur    Respiratory: Diminished breath sounds, exp wheezing  Abdomen: Soft, nontender, nondistended, + bowel sounds  Extremities: warm dry without cyanosis clubbing or edema  Neuro: AAOx3, nonfocal   Psych: Appropriate mood and affect  Data Reviewed: I have personally reviewed following labs and imaging studies  CBC: Recent Labs  Lab 03/14/18 2011 03/15/18 9357 03/16/18 0623 03/17/18 0601 03/18/18 0436 03/19/18 0612  WBC 7.2 4.5  --   --   --  7.8  NEUTROABS 5.0  --   --   --   --  6.3  HGB 8.0* 8.5* 7.7* 7.8* 7.7* 10.5*  HCT 27.6* 28.5* 25.9* 25.6* 25.9* 33.0*  MCV 105.7* 107.1*  --   --   --  95.4  PLT 226 217  --   --   --  017*   Basic Metabolic Panel: Recent Labs  Lab 03/15/18 0712 03/16/18 0623 03/17/18 0601 03/18/18 0436 03/19/18 0612  NA 141 142 144 144 141  K 7.4* 4.9 3.9 4.0 4.1  CL 112* 117* 116* 120* 114*  CO2 16* 19* 19* 20* 18*  GLUCOSE 418* 386* 314* 225* 270*  BUN 62* 42* 35* 31* 31*  CREATININE 2.59* 1.81* 1.44* 1.25* 1.19*  CALCIUM 8.6* 8.5* 8.7* 8.4* 8.8*   GFR: Estimated Creatinine Clearance: 56.8 mL/min (A) (by C-G formula based on SCr  of 1.19 mg/dL (H)). Liver Function Tests: Recent Labs  Lab 03/14/18 2011 03/16/18 0109 03/17/18 0601 03/18/18 0436  AST 43* 71* 145* 94*  ALT 46* 78* 145* 136*  ALKPHOS 366* 405* 480* 415*  BILITOT 0.6 0.6 0.7 0.7  PROT 5.8* 5.6* 5.6* 5.3*  ALBUMIN 2.2* 2.3* 2.3* 2.2*   No results for input(s): LIPASE, AMYLASE in the last 168 hours. No results for input(s): AMMONIA in the last 168 hours. Coagulation Profile: No results for input(s): INR, PROTIME in the last 168 hours. Cardiac Enzymes: No results for input(s): CKTOTAL, CKMB, CKMBINDEX, TROPONINI in the last 168 hours. BNP (last 3 results) No results for input(s): PROBNP in the last 8760 hours. HbA1C: No results for input(s): HGBA1C in the last 72 hours. CBG: Recent Labs  Lab 03/18/18 1116 03/18/18 1620 03/18/18 2045 03/19/18 0733  03/19/18 1118  GLUCAP 286* 283* 263* 242* 348*   Lipid Profile: No results for input(s): CHOL, HDL, LDLCALC, TRIG, CHOLHDL, LDLDIRECT in the last 72 hours. Thyroid Function Tests: No results for input(s): TSH, T4TOTAL, FREET4, T3FREE, THYROIDAB in the last 72 hours. Anemia Panel: No results for input(s): VITAMINB12, FOLATE, FERRITIN, TIBC, IRON, RETICCTPCT in the last 72 hours. Urine analysis:    Component Value Date/Time   COLORURINE YELLOW 03/14/2018 2113   APPEARANCEUR CLEAR 03/14/2018 2113   LABSPEC 1.014 03/14/2018 2113   PHURINE 5.0 03/14/2018 2113   GLUCOSEU 50 (A) 03/14/2018 2113   HGBUR NEGATIVE 03/14/2018 2113   BILIRUBINUR NEGATIVE 03/14/2018 2113   KETONESUR NEGATIVE 03/14/2018 2113   PROTEINUR NEGATIVE 03/14/2018 2113   UROBILINOGEN 0.2 12/27/2010 1858   NITRITE NEGATIVE 03/14/2018 2113   LEUKOCYTESUR MODERATE (A) 03/14/2018 2113   Sepsis Labs: @LABRCNTIP (procalcitonin:4,lacticidven:4)  ) Recent Results (from the past 240 hour(s))  Blood culture (routine x 2)     Status: None   Collection Time: 03/14/18  8:12 PM  Result Value Ref Range Status   Specimen Description BLOOD SITE NOT SPECIFIED  Final   Special Requests   Final    BOTTLES DRAWN AEROBIC AND ANAEROBIC Blood Culture adequate volume   Culture   Final    NO GROWTH 5 DAYS Performed at [facility], [place] [address], [place], [place] [address]    Report Status 03/19/2018 FINAL  Final  Blood culture (routine x 2)     Status: None   Collection Time: 03/14/18  9:02 PM  Result Value Ref Range Status   Specimen Description BLOOD RIGHT ANTECUBITAL  Final   Special Requests   Final    BOTTLES DRAWN AEROBIC AND ANAEROBIC Blood Culture results may not be optimal due to an inadequate volume of blood received in culture bottles   Culture   Final    NO GROWTH 5 DAYS Performed at [facility], [place] [address] [place], [place] [address]    Report Status 03/19/2018 FINAL  Final  Urine culture      Status: None   Collection Time: 03/14/18  9:13 PM  Result Value Ref Range Status   Specimen Description URINE, CATHETERIZED  Final   Special Requests   Final    NONE Performed at [facility], [place] [address] [place], [place] [address]    Culture NO GROWTH  Final   Report Status 03/16/2018 FINAL  Final      Radiology Studies: Dg Chest Port 1 View  Result Date: 03/18/2018 CLINICAL DATA:  Wheezing EXAM: PORTABLE CHEST 1 VIEW COMPARISON:  03/14/2018 FINDINGS: Cardiomegaly. Vascular congestion.  Right lower lobe airspace opacity with small to moderate right effusion. No confluent opacity or effusion on the left. No acute bony abnormality. IMPRESSION: Cardiomegaly, vascular congestion. Small to moderate right pleural effusion with right lower lobe atelectasis or pneumonia. Electronically Signed   By: Rolm Baptise M.D.   On: 03/18/2018 20:14     Scheduled Meds: . sodium chloride   Intravenous Once  . allopurinol  300 mg Oral Daily  . amLODipine  10 mg Oral Daily  . B-complex with vitamin C  1 tablet Oral Daily  . benazepril  20 mg Oral Daily  . dextromethorphan-guaiFENesin  1 tablet Oral BID  . doxepin  25 mg Oral BID  . enoxaparin (LOVENOX) injection  40 mg Subcutaneous Q24H  . famotidine  20 mg Oral QHS  . folic acid  1 mg Oral Daily  . furosemide  20 mg Oral Daily  . gabapentin  300 mg Oral TID  . hydrocerin  1 application Topical BID  . hydrocortisone cream  1 application Topical BID  . hydrOXYzine  25 mg Oral TID  . insulin aspart  0-20 Units Subcutaneous TID WC  . insulin aspart  0-5 Units Subcutaneous QHS  . insulin glargine  20 Units Subcutaneous QHS  . ipratropium  0.5 mg Nebulization TID  . levalbuterol  1.25 mg Nebulization TID  . levofloxacin  750 mg Oral Daily  . loratadine  10 mg Oral Daily  . methylPREDNISolone (SOLU-MEDROL) injection  60 mg Intravenous Q6H  . pravastatin  40 mg Oral q1800  . risperiDONE  0.5 mg Oral BID  . sodium zirconium cyclosilicate   10 g Oral Daily  . vitamin B-6  25 mg Oral Daily  . vitamin C  500 mg Oral BID  . zinc sulfate  220 mg Oral Daily   Continuous Infusions:    LOS: 4 days   Time Spent in minutes   30 minutes  Brieana Shimmin D.O. on 03/19/2018 at 12:50 PM  Between 7am to 7pm - Please see pager noted on [email or web address]  After 7pm go to [email or web address]  And look for the night coverage person covering for me after hours  [facility] Office  [phone]

## 2018-03-20 DIAGNOSIS — E875 Hyperkalemia: Secondary | ICD-10-CM | POA: Diagnosis not present

## 2018-03-20 LAB — HEMOGLOBIN AND HEMATOCRIT, BLOOD
HCT: 30.7 % — ABNORMAL LOW (ref 36.0–46.0)
Hemoglobin: 9.9 g/dL — ABNORMAL LOW (ref 12.0–15.0)

## 2018-03-20 LAB — BASIC METABOLIC PANEL
ANION GAP: 8 (ref 5–15)
BUN: 32 mg/dL — ABNORMAL HIGH (ref 8–23)
CO2: 19 mmol/L — ABNORMAL LOW (ref 22–32)
Calcium: 8.9 mg/dL (ref 8.9–10.3)
Chloride: 113 mmol/L — ABNORMAL HIGH (ref 98–111)
Creatinine, Ser: 1.08 mg/dL — ABNORMAL HIGH (ref 0.44–1.00)
GFR calc Af Amer: >60 mL/min (ref >60)
GFR calc non Af Amer: 52 mL/min — ABNORMAL LOW (ref >60)
GLUCOSE: 293 mg/dL — AB (ref 70–99)
Potassium: 4.2 mmol/L (ref 3.5–5.1)
Sodium: 140 mmol/L (ref 135–145)

## 2018-03-20 LAB — GLUCOSE, CAPILLARY
Glucose-Capillary: 273 mg/dL — ABNORMAL HIGH (ref 70–99)
Glucose-Capillary: 271 mg/dL — ABNORMAL HIGH (ref 70–99)
Glucose-Capillary: 257 mg/dL — ABNORMAL HIGH (ref 70–99)

## 2018-03-20 MED ORDER — PREDNISONE 20 MG PO TABS: 40.0000 mg | ORAL_TABLET | Freq: Every day | ORAL | 0 refills | Status: AC

## 2018-03-20 MED ORDER — METHYLPREDNISOLONE SODIUM SUCC 40 MG IJ SOLR: 40.0000 mg | Freq: Three times a day (TID) | INTRAMUSCULAR | Status: DC

## 2018-03-20 MED ORDER — PREDNISONE 20 MG PO TABS: 40.0000 mg | ORAL_TABLET | Freq: Every day | ORAL | Status: DC

## 2018-03-20 MED ORDER — LEVOFLOXACIN 750 MG PO TABS: 750.0000 mg | ORAL_TABLET | Freq: Every day | ORAL | 0 refills | Status: AC

## 2018-03-20 NOTE — Progress Notes (Signed)
Heather Petty to be D/C'd Nursing Home per MD order.  Discussed prescriptions and follow up appointments with the patient. Prescriptions given to patient, medication list explained in detail. Pt verbalized understanding.  Allergies as of 03/20/2018   No Known Allergies     Medication List    STOP taking these medications   acetaminophen 325 MG tablet Commonly known as:  TYLENOL   benazepril 10 MG tablet Commonly known as:  LOTENSIN   benazepril 20 MG tablet Commonly known as:  LOTENSIN   potassium chloride 10 MEQ tablet Commonly known as:  K-DUR   pravastatin 40 MG tablet Commonly known as:  PRAVACHOL     TAKE these medications   allopurinol 300 MG tablet Commonly known as:  ZYLOPRIM Take 300 mg by mouth daily.   amLODipine 10 MG tablet Commonly known as:  NORVASC Take 10 mg by mouth daily.   b complex vitamins tablet Take 1 tablet by mouth daily.   bisacodyl 5 MG EC tablet Commonly known as:  DULCOLAX Take 1 tablet (5 mg total) by mouth daily as needed for moderate constipation. What changed:  reasons to take this   cetirizine 10 MG tablet Commonly known as:  ZYRTEC Take 10 mg by mouth daily.   collagenase ointment Commonly known as:  SANTYL Apply topically daily. What changed:    how much to take  when to take this  additional instructions   cyanocobalamin 1000 MCG/ML injection Commonly known as:  (VITAMIN B-12) Inject 1 mL (1,000 mcg total) into the muscle once a week. What changed:  when to take this   doxepin 25 MG capsule Commonly known as:  SINEQUAN Take 25 mg by mouth 2 (two) times daily.   DYMISTA 137-50 MCG/ACT Susp Generic drug:  Azelastine-Fluticasone Place 1 spray into both nostrils 2 (two) times daily as needed (for allergies).   eucerin cream Apply 1 application topically See admin instructions. Apply to areas of "generalized body for dry/itchy skin" 2 times a day   famotidine 20 MG tablet Commonly known as:  PEPCID Take 20  mg by mouth at bedtime.   folic acid 1 MG tablet Commonly known as:  FOLVITE Take 1 mg by mouth daily.   furosemide 20 MG tablet Commonly known as:  LASIX Take 20 mg by mouth daily.   gabapentin 300 MG capsule Commonly known as:  NEURONTIN Take 300 mg by mouth 3 (three) times daily.   gabapentin 600 MG tablet Commonly known as:  NEURONTIN Take 0.5 tablets (300 mg total) by mouth 3 (three) times daily.   hydrocortisone 2.5 % cream Apply 1 application topically See admin instructions. Apply to rash on face 2 times a day   hydrOXYzine 25 MG tablet Commonly known as:  ATARAX/VISTARIL Take 25 mg by mouth 3 (three) times daily.   insulin aspart 100 UNIT/ML injection Commonly known as:  NOVOLOG Take subcu QA CHS, 140-199 - 2 units, 200-250 - 6 units, 251-299 - 8 units,  300-349 - 10 units,  350 or above 14 units. What changed:    how much to take  how to take this  when to take this  additional instructions   insulin glargine 100 UNIT/ML injection Commonly known as:  LANTUS Inject 0.2 mLs (20 Units total) into the skin at bedtime.   ipratropium-albuterol 0.5-2.5 (3) MG/3ML Soln Commonly known as:  DUONEB Take 3 mLs by nebulization 4 (four) times daily.   levofloxacin 750 MG tablet Commonly known as:  LEVAQUIN Take 1 tablet (  750 mg total) by mouth daily for 5 days. FOR 14 DAYS   nystatin powder Commonly known as:  MYCOSTATIN/NYSTOP Apply topically See admin instructions. Apply between folds, beneath breasts, bilateral axillae, and under pannus   polyethylene glycol packet Commonly known as:  MIRALAX / GLYCOLAX Take 17 g by mouth 2 (two) times daily. What changed:    when to take this  reasons to take this   predniSONE 20 MG tablet Commonly known as:  DELTASONE Take 2 tablets (40 mg total) by mouth daily with breakfast for 4 days. Start taking on:  March 21, 2018   risperiDONE 0.5 MG tablet Commonly known as:  RISPERDAL Take 1 tablet (0.5 mg total) by  mouth 2 (two) times daily.   silver sulfADIAZINE 1 % cream Commonly known as:  SILVADENE Apply topically 2 (two) times daily.   THERA-M MULTIPLE VITAMINS PO Take 1 tablet by mouth daily.   traMADol 50 MG tablet Commonly known as:  ULTRAM Take 50 mg by mouth 4 (four) times daily as needed (for pain).   triamcinolone 0.147 MG/GM topical spray Commonly known as:  KENALOG Apply 1 spray topically See admin instructions. Apply to head in the direction of hair growth   VENTOLIN HFA 108 (90 Base) MCG/ACT inhaler Generic drug:  albuterol Inhale 2 puffs into the lungs every 4 (four) hours as needed for wheezing or shortness of breath.   vitamin B-6 25 MG tablet Commonly known as:  pyridOXINE Take 25 mg by mouth daily.   vitamin C 500 MG tablet Commonly known as:  ASCORBIC ACID Take 500 mg by mouth 2 (two) times daily.   Vitamin D (Ergocalciferol) 1.25 MG (50000 UT) Caps capsule Commonly known as:  DRISDOL Take 50,000 Units by mouth every Monday.   ZINC-220 PO Take 220 mg by mouth daily.       Vitals:   03/20/18 0908 03/20/18 1338  BP: (!) 146/76   Pulse: 92   Resp: 18   Temp: 98.2 F (36.8 C)   SpO2: 97% 95%    Skin clean, dry and intact without evidence of skin break down, no evidence of skin tears noted. IV catheter discontinued intact. Site without signs and symptoms of complications. Dressing and pressure applied. Pt denies pain at this time. No complaints noted.  An After Visit Summary was printed and given to the patient. Patient escorted via [place], and D/C home via private auto.

## 2018-03-20 NOTE — Progress Notes (Signed)
Report given to Udall at [facility]. Waiting for [facility] to transport pt.

## 2018-03-20 NOTE — Clinical Social Work Note (Signed)
Patient medically stable for discharge back to [facility] today. Facility admissions director-Tracy advised and discharge clinicals transmitted to [facility] Daughter, [name] contacted and message left regarding discharge (patient's name not used). Ambulance transport arranged. CSW signing off as no other SW intervention services needed.  Keyton Bhat Givens, MSW, LCSW Licensed Clinical Social Worker [place] [phone]

## 2018-03-20 NOTE — Discharge Summary (Signed)
Physician Discharge Summary  Keyri Salberg RRN:[id number] DOB: June 02, 1950 DOA: 03/14/2018  PCP: [name], MD  Admit date: 03/14/2018 Discharge date: 03/20/2018  Admitted From: SNF Disposition:  SNF  Recommendations for Outpatient Follow-up:  1. Follow up with PCP in 1-2 weeks 2. Please obtain BMP/CBC in 2 days to follow K level 3. Needs LFT in 1 week.  4. Benazepril and simvastatin stopped due to hyperkalemia and transaminase respectively.  5. Follow resolution of PNA 6. Potassium supplement stop.    Discharge Condition: stable.  CODE STATUS: full code Diet recommendation: Heart Healthy   Brief/Interim Summary: Kip Kautzman a 68 y.o.femalewith medical history significant ofhyperlipidemia, diabetes mellitus, asthma, GERD, gout, depression, schizophrenia, morbid obesity, CKD 3, who presents with abnormal lab with hyperkalemia.  Patient was found to have hyperkalemia in the facility, with potassium of 6.4, therefore was transferred to ED for further evaluation and treatment.  She was also  diagnosed with pneumonia, for which she was started on  Levaquin. patient was also found to be hypotensive with transaminases.Marland Kitchen  1-Hyperkalemia:; Suspect  multifactorial secondary to acute renal failure, benazepril, potassium supplement. Patient receive multiple dose of Lokelma, calcium gluconate, insulin. Will discontinue benazepril. She will get a dose of Lokelma today. She will need very close follow-up of [potassium level.   2-asthma exacerbation and/pneumonia Patient was treated with nebulizer, Solu-Medrol.  Repeat a chest x-ray showed vascular congestion and atelectasis.  She was a started on Levaquin. She has received 2 days of Levaquin while in the hospital.  Will provide 5 more days. She will be transition to oral prednisone for 4 more days.  3-hypotension and lactic acidosis.  Blood pressure improved after IV fluids.  Her blood pressure has remained stable.  4-AKI on  chronic kidney disease stage III: Creatinine baseline 1.  Creatinine on admission at 3. Improved with IV fluids. Lasix was resumed. I will continue to hold benazepril.  5-Transaminases;  Stopped statins.  Suspect related to hypotension and has chronic transaminases.  Needs referral to Gastroenterologist.   Dr Carolynn Comment discussed with gastroenterology, given that patient has baseline insult to her liver, acute illness may certainly elevate her LFTs.  Would recheck in 1 week.  No further work-up necessary at this time.   6-DM type 2; continue with lantus and SSI.  Suspect CBG will decrease after tapering off steroids.   7-hyperlipidemia;  Stop statins due to transaminases.   Chronic anemia. Needs work up out patient.  Gout; continue with allopurinol.   Stage 3 pressure injuries bilateral medial buttocks; Present on admission.  Local care.   Left upper extremity; dry scar, present on admission.  Right posterior Knee serous drainage. Local care.  Posterior left chest. Local care.   Discharge Diagnoses:  Principal Problem:   Hyperkalemia Active Problems:   Gout   Type II diabetes mellitus with renal manifestations (HCC)   HTN (hypertension)   Hyperlipidemia associated with type 2 diabetes mellitus (HCC)   Pressure injury of skin   Asthma exacerbation   Hypotension   Lactic acidosis   GERD (gastroesophageal reflux disease)   Acute renal failure superimposed on stage 3 chronic kidney disease (HCC)   Macrocytic anemia   Abnormal LFTs    Discharge Instructions  Discharge Instructions    Diet - low sodium heart healthy   Complete by:  As directed    Increase activity slowly   Complete by:  As directed      Allergies as of 03/20/2018   No Known Allergies  Medication List    STOP taking these medications   benazepril 10 MG tablet Commonly known as:  LOTENSIN   benazepril 20 MG tablet Commonly known as:  LOTENSIN   potassium chloride 10 MEQ tablet Commonly  known as:  K-DUR     TAKE these medications   acetaminophen 325 MG tablet Commonly known as:  TYLENOL Take 650 mg by mouth 4 (four) times daily as needed (for pain and not to exceed 3,000 mg in a 24-hr period).   allopurinol 300 MG tablet Commonly known as:  ZYLOPRIM Take 300 mg by mouth daily.   amLODipine 10 MG tablet Commonly known as:  NORVASC Take 10 mg by mouth daily.   b complex vitamins tablet Take 1 tablet by mouth daily.   bisacodyl 5 MG EC tablet Commonly known as:  DULCOLAX Take 1 tablet (5 mg total) by mouth daily as needed for moderate constipation. What changed:  reasons to take this   cetirizine 10 MG tablet Commonly known as:  ZYRTEC Take 10 mg by mouth daily.   collagenase ointment Commonly known as:  SANTYL Apply topically daily. What changed:    how much to take  when to take this  additional instructions   cyanocobalamin 1000 MCG/ML injection Commonly known as:  (VITAMIN B-12) Inject 1 mL (1,000 mcg total) into the muscle once a week. What changed:  when to take this   doxepin 25 MG capsule Commonly known as:  SINEQUAN Take 25 mg by mouth 2 (two) times daily.   DYMISTA 137-50 MCG/ACT Susp Generic drug:  Azelastine-Fluticasone Place 1 spray into both nostrils 2 (two) times daily as needed (for allergies).   eucerin cream Apply 1 application topically See admin instructions. Apply to areas of "generalized body for dry/itchy skin" 2 times a day   famotidine 20 MG tablet Commonly known as:  PEPCID Take 20 mg by mouth at bedtime.   folic acid 1 MG tablet Commonly known as:  FOLVITE Take 1 mg by mouth daily.   furosemide 20 MG tablet Commonly known as:  LASIX Take 20 mg by mouth daily.   gabapentin 300 MG capsule Commonly known as:  NEURONTIN Take 300 mg by mouth 3 (three) times daily.   gabapentin 600 MG tablet Commonly known as:  NEURONTIN Take 0.5 tablets (300 mg total) by mouth 3 (three) times daily.   hydrocortisone 2.5 %  cream Apply 1 application topically See admin instructions. Apply to rash on face 2 times a day   hydrOXYzine 25 MG tablet Commonly known as:  ATARAX/VISTARIL Take 25 mg by mouth 3 (three) times daily.   insulin aspart 100 UNIT/ML injection Commonly known as:  NOVOLOG Take subcu QA CHS, 140-199 - 2 units, 200-250 - 6 units, 251-299 - 8 units,  300-349 - 10 units,  350 or above 14 units. What changed:    how much to take  how to take this  when to take this  additional instructions   insulin glargine 100 UNIT/ML injection Commonly known as:  LANTUS Inject 0.2 mLs (20 Units total) into the skin at bedtime.   ipratropium-albuterol 0.5-2.5 (3) MG/3ML Soln Commonly known as:  DUONEB Take 3 mLs by nebulization 4 (four) times daily.   levofloxacin 750 MG tablet Commonly known as:  LEVAQUIN Take 1 tablet (750 mg total) by mouth daily for 5 days. FOR 14 DAYS   nystatin powder Commonly known as:  MYCOSTATIN/NYSTOP Apply topically See admin instructions. Apply between folds, beneath breasts, bilateral axillae,  and under pannus   polyethylene glycol packet Commonly known as:  MIRALAX / GLYCOLAX Take 17 g by mouth 2 (two) times daily. What changed:    when to take this  reasons to take this   pravastatin 40 MG tablet Commonly known as:  PRAVACHOL Take 40 mg by mouth daily.   predniSONE 20 MG tablet Commonly known as:  DELTASONE Take 2 tablets (40 mg total) by mouth daily with breakfast for 4 days. Start taking on:  March 21, 2018   risperiDONE 0.5 MG tablet Commonly known as:  RISPERDAL Take 1 tablet (0.5 mg total) by mouth 2 (two) times daily.   silver sulfADIAZINE 1 % cream Commonly known as:  SILVADENE Apply topically 2 (two) times daily.   THERA-M MULTIPLE VITAMINS PO Take 1 tablet by mouth daily.   traMADol 50 MG tablet Commonly known as:  ULTRAM Take 50 mg by mouth 4 (four) times daily as needed (for pain).   triamcinolone 0.147 MG/GM topical  spray Commonly known as:  KENALOG Apply 1 spray topically See admin instructions. Apply to head in the direction of hair growth   VENTOLIN HFA 108 (90 Base) MCG/ACT inhaler Generic drug:  albuterol Inhale 2 puffs into the lungs every 4 (four) hours as needed for wheezing or shortness of breath.   vitamin B-6 25 MG tablet Commonly known as:  pyridOXINE Take 25 mg by mouth daily.   vitamin C 500 MG tablet Commonly known as:  ASCORBIC ACID Take 500 mg by mouth 2 (two) times daily.   Vitamin D (Ergocalciferol) 1.25 MG (50000 UT) Caps capsule Commonly known as:  DRISDOL Take 50,000 Units by mouth every Monday.   ZINC-220 PO Take 220 mg by mouth daily.       No Known Allergies  Consultations: none  Procedures/Studies: Ct Head Wo Contrast  Result Date: 02/28/2018 CLINICAL DATA:  Blurry vision. EXAM: CT HEAD WITHOUT CONTRAST TECHNIQUE: Contiguous axial images were obtained from the base of the skull through the vertex without intravenous contrast. COMPARISON:  None. FINDINGS: Brain: There is no evidence of acute infarct, intracranial hemorrhage, mass, midline shift, or extra-axial fluid collection. Mild cerebral atrophy is within normal limits for age. Cerebral white matter hypodensities are nonspecific but compatible with mild chronic small vessel ischemic disease. Vascular: Calcified atherosclerosis at the skull base. No hyperdense vessel. Skull: No fracture or focal osseous lesion. Sinuses/Orbits: Paranasal sinuses and mastoid air cells are clear. Unremarkable orbits. Other: None. IMPRESSION: 1. No evidence of acute intracranial abnormality. 2. Mild chronic small vessel ischemic disease. Electronically Signed   By: Logan Bores M.D.   On: 02/28/2018 17:42   Dg Chest Port 1 View  Result Date: 03/18/2018 CLINICAL DATA:  Wheezing EXAM: PORTABLE CHEST 1 VIEW COMPARISON:  03/14/2018 FINDINGS: Cardiomegaly. Vascular congestion. Right lower lobe airspace opacity with small to moderate right  effusion. No confluent opacity or effusion on the left. No acute bony abnormality. IMPRESSION: Cardiomegaly, vascular congestion. Small to moderate right pleural effusion with right lower lobe atelectasis or pneumonia. Electronically Signed   By: Rolm Baptise M.D.   On: 03/18/2018 20:14   Dg Chest Port 1 View  Result Date: 03/14/2018 CLINICAL DATA:  Shortness of breath EXAM: PORTABLE CHEST 1 VIEW COMPARISON:  03/05/2018 FINDINGS: There is bibasilar atelectasis. There is elevation of the right diaphragm. There is no focal consolidation. There is no pleural effusion or pneumothorax. The heart and mediastinal contours are unremarkable. The osseous structures are unremarkable. IMPRESSION: No active disease. Electronically Signed  By: Kathreen Devoid   On: 03/14/2018 20:25   Dg Chest Port 1 View  Result Date: 03/05/2018 CLINICAL DATA:  Shortness of breath EXAM: PORTABLE CHEST 1 VIEW COMPARISON:  02/28/2018 FINDINGS: Generalized interstitial coarsening with streaky right perihilar opacity. No Kerley lines, effusion, or pneumothorax. Borderline cardiomegaly. IMPRESSION: Bronchitic or congestive interstitial coarsening with right perihilar atelectasis. Electronically Signed   By: Monte Fantasia M.D.   On: 03/05/2018 09:00   Dg Chest Portable 1 View  Result Date: 02/28/2018 CLINICAL DATA:  Hyperglycemia EXAM: PORTABLE CHEST 1 VIEW COMPARISON:  02/05/2018 FINDINGS: Mild cardiomegaly. No focal consolidation or effusion. Aortic atherosclerosis. No pneumothorax. IMPRESSION: No active disease.  Mild cardiomegaly Electronically Signed   By: Donavan Foil M.D.   On: 02/28/2018 16:24   Dg Abd Portable 1v  Result Date: 03/04/2018 CLINICAL DATA:  68 y/o  F; nausea and vomiting. EXAM: PORTABLE ABDOMEN - 1 VIEW COMPARISON:  03/01/2018 abdomen radiograph FINDINGS: The bowel gas pattern is normal. No radio-opaque calculi or other significant radiographic abnormality are seen. Dextrocurvature of the lumbar spine and  multilevel degenerative changes. IMPRESSION: Normal bowel gas pattern. Electronically Signed   By: Kristine Garbe M.D.   On: 03/04/2018 13:40   Dg Abd Portable 1v  Result Date: 03/01/2018 CLINICAL DATA:  Nausea vomiting. EXAM: PORTABLE ABDOMEN - 1 VIEW COMPARISON:  02/05/2018. FINDINGS: Soft tissue structures are unremarkable. Air-filled loops of small and large bowel noted. Adynamic ileus could present this fashion. Pelvic calcifications consistent phleboliths. Degenerative changes scoliosis lumbar spine. Degenerative changes both hips. IMPRESSION: Air-filled loops of small and large bowel noted consistent with adynamic ileus. Electronically Signed   By: Marcello Moores  Register   On: 03/01/2018 13:02   US Abdomen Limited Ruq  Result Date: 03/16/2018 CLINICAL DATA:  Elevated LFTs EXAM: ULTRASOUND ABDOMEN LIMITED RIGHT UPPER QUADRANT COMPARISON:  None. FINDINGS: Gallbladder: No gallstones or wall thickening visualized. No sonographic Murphy sign noted by sonographer. Common bile duct: Diameter: 4 mm Liver: Increased in echogenicity. No focal lesion identified portal vein is patent on color Doppler imaging with normal direction of blood flow towards the liver. IMPRESSION: Hepatic steatosis. No cholelithiasis or sonographic evidence for acute cholecystitis. Electronically Signed   By: Lovey Newcomer M.D.   On: 03/16/2018 20:25    Subjective: She is feeling much better, dyspnea has improved. Productive cough persist.   Discharge Exam: Vitals:   03/20/18 0700 03/20/18 0908  BP:  (!) 146/76  Pulse:  92  Resp:  18  Temp:  98.2 F (36.8 C)  SpO2: 96% 97%   Vitals:   03/19/18 2231 03/20/18 0534 03/20/18 0700 03/20/18 0908  BP: (!) 150/74 (!) 146/70  (!) 146/76  Pulse: 99 89  92  Resp: 18 (!) 21  18  Temp: 98.6 F (37 C) 98.1 F (36.7 C)  98.2 F (36.8 C)  TempSrc: Oral Oral  Oral  SpO2: 93% 98% 96% 97%  Weight:      Height:        General: Pt is alert, awake, not in acute  distress Cardiovascular: RRR, S1/S2 +, no rubs, no gallops Respiratory: CTA bilaterally, no wheezing, bilateral ronchus. Abdominal: Soft, NT, ND, bowel sounds + Extremities: no edema, no cyanosis    The results of significant diagnostics from this hospitalization (including imaging, microbiology, ancillary and laboratory) are listed below for reference.     Microbiology: Recent Results (from the past 240 hour(s))  Blood culture (routine x 2)     Status: None   Collection  Time: 03/14/18  8:12 PM  Result Value Ref Range Status   Specimen Description BLOOD SITE NOT SPECIFIED  Final   Special Requests   Final    BOTTLES DRAWN AEROBIC AND ANAEROBIC Blood Culture adequate volume   Culture   Final    NO GROWTH 5 DAYS Performed at [facility], [address], [place], [place] [address]    Report Status 03/19/2018 FINAL  Final  Blood culture (routine x 2)     Status: None   Collection Time: 03/14/18  9:02 PM  Result Value Ref Range Status   Specimen Description BLOOD RIGHT ANTECUBITAL  Final   Special Requests   Final    BOTTLES DRAWN AEROBIC AND ANAEROBIC Blood Culture results may not be optimal due to an inadequate volume of blood received in culture bottles   Culture   Final    NO GROWTH 5 DAYS Performed at [facility], [place] [address], [place], [place] [address]    Report Status 03/19/2018 FINAL  Final  Urine culture     Status: None   Collection Time: 03/14/18  9:13 PM  Result Value Ref Range Status   Specimen Description URINE, CATHETERIZED  Final   Special Requests   Final    NONE Performed at [facility], [place] [address], [place],  [address]    Culture NO GROWTH  Final   Report Status 03/16/2018 FINAL  Final     Labs: BNP (last 3 results) Recent Labs    03/03/18 0927 03/14/18 2011  BNP 174.8* 63.1   Basic Metabolic Panel: Recent Labs  Lab 03/16/18 0623 03/17/18 0601 03/18/18 0436 03/19/18 0612 03/20/18 0731  NA 142 144 144 141  140  K 4.9 3.9 4.0 4.1 4.2  CL 117* 116* 120* 114* 113*  CO2 19* 19* 20* 18* 19*  GLUCOSE 386* 314* 225* 270* 293*  BUN 42* 35* 31* 31* 32*  CREATININE 1.81* 1.44* 1.25* 1.19* 1.08*  CALCIUM 8.5* 8.7* 8.4* 8.8* 8.9   Liver Function Tests: Recent Labs  Lab 03/14/18 2011 03/16/18 0623 03/17/18 0601 03/18/18 0436  AST 43* 71* 145* 94*  ALT 46* 78* 145* 136*  ALKPHOS 366* 405* 480* 415*  BILITOT 0.6 0.6 0.7 0.7  PROT 5.8* 5.6* 5.6* 5.3*  ALBUMIN 2.2* 2.3* 2.3* 2.2*   No results for input(s): LIPASE, AMYLASE in the last 168 hours. No results for input(s): AMMONIA in the last 168 hours. CBC: Recent Labs  Lab 03/14/18 2011 03/15/18 4970 03/16/18 0623 03/17/18 0601 03/18/18 0436 03/19/18 0612  WBC 7.2 4.5  --   --   --  7.8  NEUTROABS 5.0  --   --   --   --  6.3  HGB 8.0* 8.5* 7.7* 7.8* 7.7* 10.5*  HCT 27.6* 28.5* 25.9* 25.6* 25.9* 33.0*  MCV 105.7* 107.1*  --   --   --  95.4  PLT 226 217  --   --   --  148*   Cardiac Enzymes: No results for input(s): CKTOTAL, CKMB, CKMBINDEX, TROPONINI in the last 168 hours. BNP: Invalid input(s): POCBNP CBG: Recent Labs  Lab 03/19/18 0733 03/19/18 1118 03/19/18 1642 03/19/18 2031 03/20/18 0734  GLUCAP 242* 348* 290* 311* 273*   D-Dimer No results for input(s): DDIMER in the last 72 hours. Hgb A1c No results for input(s): HGBA1C in the last 72 hours. Lipid Profile No results for input(s): CHOL, HDL, LDLCALC, TRIG, CHOLHDL, LDLDIRECT in the last 72 hours. Thyroid  function studies No results for input(s): TSH, T4TOTAL, T3FREE, THYROIDAB in the last 72 hours.  Invalid input(s): FREET3 Anemia work up No results for input(s): VITAMINB12, FOLATE, FERRITIN, TIBC, IRON, RETICCTPCT in the last 72 hours. Urinalysis    Component Value Date/Time   COLORURINE YELLOW 03/14/2018 2113   APPEARANCEUR CLEAR 03/14/2018 2113   LABSPEC 1.014 03/14/2018 2113   PHURINE 5.0 03/14/2018 2113   GLUCOSEU 50 (A) 03/14/2018 2113   HGBUR NEGATIVE  03/14/2018 2113   BILIRUBINUR NEGATIVE 03/14/2018 2113   KETONESUR NEGATIVE 03/14/2018 2113   PROTEINUR NEGATIVE 03/14/2018 2113   UROBILINOGEN 0.2 12/27/2010 1858   NITRITE NEGATIVE 03/14/2018 2113   LEUKOCYTESUR MODERATE (A) 03/14/2018 2113   Sepsis Labs Invalid input(s): PROCALCITONIN,  WBC,  LACTICIDVEN Microbiology Recent Results (from the past 240 hour(s))  Blood culture (routine x 2)     Status: None   Collection Time: 03/14/18  8:12 PM  Result Value Ref Range Status   Specimen Description BLOOD SITE NOT SPECIFIED  Final   Special Requests   Final    BOTTLES DRAWN AEROBIC AND ANAEROBIC Blood Culture adequate volume   Culture   Final    NO GROWTH 5 DAYS Performed at [facility], [address] [place] [address] [place], [place] [address]    Report Status 03/19/2018 FINAL  Final  Blood culture (routine x 2)     Status: None   Collection Time: 03/14/18  9:02 PM  Result Value Ref Range Status   Specimen Description BLOOD RIGHT ANTECUBITAL  Final   Special Requests   Final    BOTTLES DRAWN AEROBIC AND ANAEROBIC Blood Culture results may not be optimal due to an inadequate volume of blood received in culture bottles   Culture   Final    NO GROWTH 5 DAYS Performed at [facility], [place] [address] [place], [place] [address]    Report Status 03/19/2018 FINAL  Final  Urine culture     Status: None   Collection Time: 03/14/18  9:13 PM  Result Value Ref Range Status   Specimen Description URINE, CATHETERIZED  Final   Special Requests   Final    NONE Performed at [facility], [place] [address], [place],  [address]    Culture NO GROWTH  Final   Report Status 03/16/2018 FINAL  Final    Time coordinating discharge: 35 minutes.   SIGNED:   Elmarie Shiley, MD  [facility] 03/20/2018, 9:33 AM

## 2018-03-27 ENCOUNTER — Encounter: Payer: Medicare Other | Attending: Internal Medicine | Admitting: Internal Medicine

## 2018-03-27 ENCOUNTER — Other Ambulatory Visit
Admission: RE | Admit: 2018-03-27 | Discharge: 2018-03-27 | Disposition: A | Discharge status: Home / Self Care | Payer: Medicare Other | Source: Ambulatory Visit | Attending: Internal Medicine | Admitting: Internal Medicine

## 2018-03-27 DIAGNOSIS — T8089XA Other complications following infusion, transfusion and therapeutic injection, initial encounter: Secondary | ICD-10-CM | POA: Diagnosis not present

## 2018-03-27 DIAGNOSIS — Z794 Long term (current) use of insulin: Secondary | ICD-10-CM | POA: Insufficient documentation

## 2018-03-27 DIAGNOSIS — B999 Unspecified infectious disease: Secondary | ICD-10-CM | POA: Insufficient documentation

## 2018-03-27 DIAGNOSIS — L89322 Pressure ulcer of left buttock, stage 2: Secondary | ICD-10-CM | POA: Insufficient documentation

## 2018-03-27 DIAGNOSIS — L98498 Non-pressure chronic ulcer of skin of other sites with other specified severity: Secondary | ICD-10-CM | POA: Diagnosis not present

## 2018-03-27 DIAGNOSIS — E1140 Type 2 diabetes mellitus with diabetic neuropathy, unspecified: Secondary | ICD-10-CM | POA: Insufficient documentation

## 2018-03-27 DIAGNOSIS — L89324 Pressure ulcer of left buttock, stage 4: Secondary | ICD-10-CM | POA: Insufficient documentation

## 2018-03-27 DIAGNOSIS — Y848 Other medical procedures as the cause of abnormal reaction of the patient, or of later complication, without mention of misadventure at the time of the procedure: Secondary | ICD-10-CM | POA: Diagnosis not present

## 2018-03-27 DIAGNOSIS — N2889 Other specified disorders of kidney and ureter: Secondary | ICD-10-CM | POA: Diagnosis not present

## 2018-03-27 DIAGNOSIS — L89152 Pressure ulcer of sacral region, stage 2: Secondary | ICD-10-CM | POA: Diagnosis not present

## 2018-03-27 DIAGNOSIS — I10 Essential (primary) hypertension: Secondary | ICD-10-CM | POA: Diagnosis not present

## 2018-03-27 DIAGNOSIS — L89312 Pressure ulcer of right buttock, stage 2: Secondary | ICD-10-CM | POA: Insufficient documentation

## 2018-03-28 NOTE — Progress Notes (Signed)
LAKHIA, GENGLER ([id number]) Visit Report for 03/27/2018 Abuse/Suicide Risk Screen Details Patient Name: Heather Petty, Heather Petty Date of Service: 03/27/2018 1:00 PM Medical Record Number: [id number] Patient Account Number: [id number] Date of Birth/Sex: 1950-12-06 (68 y.o. F) Treating RN: Montey Hora Primary Care Kendal Raffo: [name] Other Clinician: Referring Lindberg Zenon: Brain Hilts Treating Jamariya Davidoff/Extender: Tito Dine in Treatment: 0 Abuse/Suicide Risk Screen Items Answer ABUSE/SUICIDE RISK SCREEN: Has anyone close to you tried to hurt or harm you recentlyo No Do you feel uncomfortable with anyone in your familyo No Has anyone forced you do things that you didnot want to doo No Do you have any thoughts of harming yourselfo No Patient displays signs or symptoms of abuse and/or neglect. No Electronic Signature(s) Signed: 03/27/2018 5:36:16 PM By: Montey Hora Entered By: Montey Hora on 03/27/2018 13:11:05 Heather Petty ([id number]) -------------------------------------------------------------------------------- Activities of Daily Living Details Patient Name: Heather Petty Date of Service: 03/27/2018 1:00 PM Medical Record Number: [id number] Patient Account Number: [id number] Date of Birth/Sex: 1950/04/23 (67 y.o. F) Treating RN: Montey Hora Primary Care Abbas Beyene: [name] Other Clinician: Referring Ezell Melikian: Brain Hilts Treating Tonnia Bardin/Extender: Tito Dine in Treatment: 0 Activities of Daily Living Items Answer Activities of Daily Living (Please select one for each item) Drive Automobile Not Able Take Medications Need Assistance Use Telephone Need Assistance Care for Appearance Need Assistance Use Toilet Need Assistance Bath / Shower Need Assistance Dress Self Need Assistance Feed Self Completely Able Walk Not Able Get In / Out Bed Need Assistance Housework Need Man for Self Need Assistance Electronic Signature(s) Signed: 03/27/2018 5:36:16 PM By: Montey Hora Entered By: Montey Hora on 03/27/2018 13:12:21 Heather Petty ([id number]) -------------------------------------------------------------------------------- Education Assessment Details Patient Name: Heather Petty Date of Service: 03/27/2018 1:00 PM Medical Record Number: [id number] Patient Account Number: [id number] Date of Birth/Sex: 1950-05-13 (67 y.o. F) Treating RN: Montey Hora Primary Care Breezie Micucci: [name] Other Clinician: Referring Shanti Agresti: Brain Hilts Treating Lerline Valdivia/Extender: Tito Dine in Treatment: 0 Primary Learner Assessed: Patient Learning Preferences/Education Level/Primary Language Learning Preference: Explanation, Demonstration Highest Education Level: High School Cognitive Barrier Assessment/Beliefs Language Barrier: No Translator Needed: No Memory Deficit: No Emotional Barrier: No Cultural/Religious Beliefs Affecting Medical Care: No Physical Barrier Assessment Impaired Vision: No Impaired Hearing: No Decreased Hand dexterity: No Knowledge/Comprehension Assessment Knowledge Level: Medium Comprehension Level: Medium Ability to understand written Medium instructions: Ability to understand verbal Medium instructions: Motivation Assessment Anxiety Level: Calm Cooperation: Cooperative Education Importance: Acknowledges Need Interest in Health Problems: Asks Questions Perception: Coherent Willingness to Engage in Self- Medium Management Activities: Readiness to Engage in Self- Medium Management Activities: Electronic Signature(s) Signed: 03/27/2018 5:36:16 PM By: Montey Hora Entered By: Montey Hora on 03/27/2018 13:12:45 Heather Petty ([id number]) -------------------------------------------------------------------------------- Fall Risk Assessment Details Patient Name: Heather Petty Date of Service: 03/27/2018 1:00 PM Medical Record Number: [id number] Patient Account Number: [id number] Date of Birth/Sex: August 11, 1950 (67 y.o. F) Treating RN: Montey Hora Primary Care Dequarius Jeffries: [name] Other Clinician: Referring Malayla Granberry: Brain Hilts Treating Fallon Howerter/Extender: Tito Dine in Treatment: 0 Fall Risk Assessment Items Have you had 2 or more falls in the last 12 monthso 0 No Have you had any fall that resulted in injury in the last 12 monthso 0 No FALL RISK ASSESSMENT: History of falling - immediate or within 3 months 0 No Secondary diagnosis 0 No Ambulatory aid None/bed rest/wheelchair/nurse 0 Yes Crutches/cane/walker 0 No Furniture 0 No IV Access/Saline Lock 0 No Gait/Training Normal/bed rest/immobile 0 No  Weak 10 Yes Impaired 20 Yes Mental Status Oriented to own ability 0 Yes Electronic Signature(s) Signed: 03/27/2018 5:36:16 PM By: Montey Hora Entered By: Montey Hora on 03/27/2018 13:13:59 Heather Petty ([id number]) -------------------------------------------------------------------------------- Foot Assessment Details Patient Name: Heather Petty Date of Service: 03/27/2018 1:00 PM Medical Record Number: [id number] Patient Account Number: [id number] Date of Birth/Sex: [birth year] (67 y.o. F) Treating RN: Montey Hora Primary Care Markala Sitts: [name] Other Clinician: Referring Loran Auguste: Brain Hilts Treating Milfred Krammes/Extender: Tito Dine in Treatment: 0 Foot Assessment Items Site Locations + = Sensation present, - = Sensation absent, C = Callus, U = Ulcer R = Redness, W = Warmth, M = Maceration, PU = Pre-ulcerative lesion F = Fissure, S = Swelling, D = Dryness Assessment Right: Left: Other Deformity: No No Prior Foot Ulcer: No No Prior Amputation: No No Charcot Joint: No No Ambulatory Status: Ambulatory With Help Assistance Device: Wheelchair Gait: Steady Electronic  Signature(s) Signed: 03/27/2018 5:36:16 PM By: Montey Hora Entered By: Montey Hora on 03/27/2018 13:14:14 Heather Petty ([id number]) -------------------------------------------------------------------------------- Nutrition Risk Assessment Details Patient Name: Heather Petty Date of Service: 03/27/2018 1:00 PM Medical Record Number: [id number] Patient Account Number: [id number] Date of Birth/Sex: November 09, 1950 (67 y.o. F) Treating RN: Montey Hora Primary Care Othar Curto: [name] Other Clinician: Referring Carly Sabo: Brain Hilts Treating Marilynne Dupuis/Extender: Tito Dine in Treatment: 0 Height (in): 61 Weight (lbs): 273 Body Mass Index (BMI): 51.6 Nutrition Risk Assessment Items NUTRITION RISK SCREEN: I have an illness or condition that made me change the kind and/or amount of 0 No food I eat I eat fewer than two meals per day 0 No I eat few fruits and vegetables, or milk products 0 No I have three or more drinks of beer, liquor or wine almost every day 0 No I have tooth or mouth problems that make it hard for me to eat 0 No I don't always have enough money to buy the food I need 0 No I eat alone most of the time 0 No I take three or more different prescribed or over-the-counter drugs a day 1 Yes Without wanting to, I have lost or gained 10 pounds in the last six months 0 No I am not always physically able to shop, cook and/or feed myself 0 No Nutrition Protocols Good Risk Protocol 0 No interventions needed Moderate Risk Protocol Electronic Signature(s) Signed: 03/27/2018 5:36:16 PM By: Montey Hora Entered By: Montey Hora on 03/27/2018 13:14:05

## 2018-03-28 NOTE — Progress Notes (Signed)
Heather Petty ([id number]) Visit Report for 03/27/2018 Chief Complaint Document Details Patient Name: Heather Petty, Heather Petty Date of Service: 03/27/2018 1:00 PM Medical Record Number: [id number] Patient Account Number: [id number] Date of Birth/Sex: 08-15-1950 (67 y.o. F) Treating RN: Cornell Barman Primary Care Provider: [name] Other Clinician: Referring Provider: Brain Hilts Treating Provider/Extender: Tito Dine in Treatment: 0 Information Obtained from: Patient Chief Complaint 03/27/2018; patient is here for review of multiple skin wounds Electronic Signature(s) Signed: 03/27/2018 6:18:32 PM By: Linton Ham MD Entered By: Linton Ham on 03/27/2018 14:39:20 Heather Petty ([id number]) -------------------------------------------------------------------------------- Debridement Details Patient Name: Heather Petty Date of Service: 03/27/2018 1:00 PM Medical Record Number: [id number] Patient Account Number: [id number] Date of Birth/Sex: 12-28-1950 (67 y.o. F) Treating RN: Cornell Barman Primary Care Provider: [name] Other Clinician: Referring Provider: Brain Hilts Treating Provider/Extender: Tito Dine in Treatment: 0 Debridement Performed for Wound #1 Left Upper Arm Assessment: Performed By: Physician Ricard Dillon, MD Debridement Type: Debridement Level of Consciousness (Pre- Awake and Alert procedure): Pre-procedure Verification/Time Yes - 13:55 Out Taken: Start Time: 13:55 Pain Control: Lidocaine Injectable : Total Area Debrided (L x W): 1.5 (cm) x 4.1 (cm) = 6.15 (cm) Tissue and other material Non-Viable, Eschar debrided: Level: Non-Viable Tissue Debridement Description: Selective/Open Wound Instrument: Blade, Forceps Bleeding: Minimum Hemostasis Achieved: Pressure End Time: 14:03 Response to Treatment: Procedure was tolerated well Level of Consciousness Awake and Alert (Post-procedure): Post  Debridement Measurements of Total Wound Length: (cm) 1.5 Width: (cm) 4.1 Depth: (cm) 0.3 Volume: (cm) 1.449 Character of Wound/Ulcer Post Debridement: Stable Post Procedure Diagnosis Same as Pre-procedure Electronic Signature(s) Signed: 03/27/2018 6:18:32 PM By: Linton Ham MD Signed: 03/27/2018 6:42:35 PM By: Gretta Cool, BSN, RN, CWS, Kim RN, BSN Entered By: Gretta Cool, BSN, RN, CWS, Kim on 03/27/2018 14:04:08 Heather Petty ([id number]) -------------------------------------------------------------------------------- Debridement Details Patient Name: Heather Petty Date of Service: 03/27/2018 1:00 PM Medical Record Number: [id number] Patient Account Number: [id number] Date of Birth/Sex: 1950/05/29 (67 y.o. F) Treating RN: Cornell Barman Primary Care Provider: [name] Other Clinician: Referring Provider: Brain Hilts Treating Provider/Extender: Tito Dine in Treatment: 0 Debridement Performed for Wound #2 Left,Proximal Gluteus Assessment: Performed By: Physician Ricard Dillon, MD Debridement Type: Debridement Level of Consciousness (Pre- Awake and Alert procedure): Pre-procedure Verification/Time Yes - 13:55 Out Taken: Start Time: 13:55 Pain Control: Lidocaine Injectable : 2 Total Area Debrided (L x W): 4 (cm) x 4 (cm) = 16 (cm) Tissue and other material Viable, Non-Viable, Muscle, Slough, Subcutaneous, Skin: Dermis , Slough debrided: Level: Skin/Subcutaneous Tissue/Muscle Debridement Description: Excisional Instrument: Blade, Curette, Forceps Bleeding: Large Hemostasis Achieved: Silver Nitrate End Time: 14:03 Response to Treatment: Procedure was tolerated well Level of Consciousness Awake and Alert (Post-procedure): Post Debridement Measurements of Total Wound Length: (cm) 4 Stage: Category/Stage IV Width: (cm) 13 Depth: (cm) 2.6 Volume: (cm) 106.186 Character of Wound/Ulcer Post Stable Debridement: Post Procedure Diagnosis Same  as Pre-procedure Electronic Signature(s) Signed: 03/27/2018 6:18:32 PM By: Linton Ham MD Signed: 03/27/2018 6:42:35 PM By: Gretta Cool, BSN, RN, CWS, Kim RN, BSN Entered By: Gretta Cool, BSN, RN, CWS, Kim on 03/27/2018 14:18:13 Heather Petty ([id number]) -------------------------------------------------------------------------------- HPI Details Patient Name: Heather Petty Date of Service: 03/27/2018 1:00 PM Medical Record Number: [id number] Patient Account Number: [id number] Date of Birth/Sex: 12/12/1950 (68 y.o. F) Treating RN: Cornell Barman Primary Care Provider: [name] Other Clinician: Referring Provider: Brain Hilts Treating Provider/Extender: Tito Dine in Treatment: 0 History of Present Illness HPI Description: ADMISSION 03/28/2017 This is a 68 year old woman who is  currently at [facility] skilled facility. She has had several recent admissions to hospital starting in the early part of January 2020. She was admitted to [facility] from 02/05/2018 through 02/09/2018. At that point she had open wounds on her back. Diffuse dermatitis with multiple skin wounds. She was referred to [facility] where she stayed from 1/3 through 1/16. She had a biopsy done of the skin areas that showed an unclear dermatitis. 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 Per dermatology rule out nutritional deficiencies. She was also seen by wound care and plastics. She is also been hospitalized from 1/23 through 1/28 with acute kidney injury and hyperglycemia. At that point noted to have "chronic sacral decubitus]. Finally she was hospitalized from 2/6 through 2/12 again with hyperkalemia asthma and pneumonia and hypotension acute on chronic kidney injury. She comes in today with a deep open area on her left upper gluteal at the superior margin of her pelvis. This had 3 separate openings deep and necrotic. She has more standard pressure ulcers over her  lower sacrum and close surrounding buttocks stage II. Finally she has a necrotic wound on her left upper arm. Apparently this was caused by an infusion injury while she has been in the hospital question IV Lasix that extravasated. I do not see that commented on any of the material I read but that has the history I was given today. Past medical history includes type 2 diabetes on insulin asthma gastroesophageal reflux disease gout schizophrenia chronic renal failure stage III Electronic Signature(s) Signed: 03/27/2018 6:18:32 PM By: Linton Ham MD Entered By: Linton Ham on 03/27/2018 14:45:01 Heather Petty ([id number]) -------------------------------------------------------------------------------- Physical Exam Details Patient Name: Heather Petty Date of Service: 03/27/2018 1:00 PM Medical Record Number: [id number] Patient Account Number: [id number] Date of Birth/Sex: [birth year] (68 y.o. F) Treating RN: Cornell Barman Primary Care Provider: [name] Other Clinician: Referring Provider: Brain Hilts Treating Provider/Extender: Ricard Dillon Weeks in Treatment: 0 Constitutional Sitting or standing Blood Pressure is within target range for patient.. Pulse regular and within target range for patient.Marland Kitchen Respirations regular, non-labored and within target range.. Temperature is normal and within the target range for the patient.Marland Kitchen appears in no distress. Eyes Conjunctivae clear. No discharge. Respiratory Respiratory effort is easy and symmetric bilaterally. Rate is normal at rest and on room air.. Bilateral breath sounds are clear and equal in all lobes with no wheezes, rales or rhonchi.. Cardiovascular Heart rhythm and rate regular, without murmur or gallop.. Gastrointestinal (GI) Abdomen is soft and non-distended without masses or tenderness. Bowel sounds active in all quadrants.. No liver or spleen enlargement or tenderness.. Musculoskeletal Active arthritis in  the right first second and third MTPs and ankle. Integumentary (Hair, Skin) Widespread vitiligo. She has some erythema on her skin especially in the left flanks but not particularly tender. Psychiatric No evidence of depression, anxiety, or agitation. Calm, cooperative, and communicative. Appropriate interactions and affect.. Notes Wound exam oThe major area here is on the left upper gluteal/buttock. This is at the rim of the pelvis. There are 3 open areas here to of which connect and I think the third and most lateral at one point connected. Using pickups and a #15 scalpel I separated the skin of the more proximal undermining areas. Hemostasis with silver nitrate. Then using an open curette necrotic debris which I think is largely muscle in this wound that goes clearly down to muscle. A culture was done oShe has also more standard pressure ulcers on  the sacrum and proximal left and right buttock which are stage II wounds oFinally she has an area just at the proximal upper arm anteriorly. This is covered in a necrotic eschar. Using injectable lidocaine the area was anesthetized and using pickups and scalpel the eschar and some necrotic debris was removed. This was apparently an infusion injury although I do not see that anywhere on her hospital paperwork although admittedly I have not looked through every word of 3 difficult recent admissions Electronic Signature(s) Signed: 03/27/2018 6:18:32 PM By: Linton Ham MD Entered By: Linton Ham on 03/27/2018 15:08:55 Heather Petty ([id number]) -------------------------------------------------------------------------------- Physician Orders Details Patient Name: Heather Petty Date of Service: 03/27/2018 1:00 PM Medical Record Number: [id number] Patient Account Number: [id number] Date of Birth/Sex: 1951-02-03 (68 y.o. F) Treating RN: Cornell Barman Primary Care Provider: [name] Other Clinician: Referring Provider: Brain Hilts Treating Provider/Extender: Tito Dine in Treatment: 0 Verbal / Phone Orders: No Diagnosis Coding Wound Cleansing Wound #1 Left Upper Arm o Clean wound with Normal Saline. Wound #2 Left,Proximal Gluteus o Clean wound with Normal Saline. Wound #3 Right Gluteus o Clean wound with Normal Saline. Wound #4 Left,Medial Gluteus o Clean wound with Normal Saline. Wound #5 Midline Sacrum o Clean wound with Normal Saline. Anesthetic (add to Medication List) Wound #1 Left Upper Arm o Topical Lidocaine 4% cream applied to wound bed prior to debridement (In Clinic Only). o Injected 2% Xylocaine MPF prior to debridement (In Clinic Only). Wound #2 Left,Proximal Gluteus o Topical Lidocaine 4% cream applied to wound bed prior to debridement (In Clinic Only). o Injected 2% Xylocaine MPF prior to debridement (In Clinic Only). Wound #3 Right Gluteus o Topical Lidocaine 4% cream applied to wound bed prior to debridement (In Clinic Only). Wound #4 Left,Medial Gluteus o Topical Lidocaine 4% cream applied to wound bed prior to debridement (In Clinic Only). Primary Wound Dressing Wound #1 Left Upper Arm o Santyl Ointment Wound #2 Left,Proximal Gluteus o Santyl Ointment Wound #3 Right Gluteus o Silver Alginate Wound #4 Left,Medial Gluteus o Silver Alginate Wound #5 Midline LANESHA, AZZARO ([id number]) o Silver Alginate Secondary Dressing Wound #1 Left Upper Arm o Boardered Foam Dressing Wound #2 Left,Proximal Gluteus o Other - wet-to-dry ABD to secure Wound #3 Right Gluteus o Boardered Foam Dressing Wound #4 Left,Medial Gluteus o Boardered Foam Dressing Wound #5 Midline Sacrum o Boardered Foam Dressing Dressing Change Frequency Wound #1 Left Upper Arm o Change dressing every day. - More often as needed. Wound #2 Left,Proximal Gluteus o Change dressing every day. - More often as needed. Wound #3 Right Gluteus o  Change dressing every day. - More often as needed. Wound #4 Left,Medial Gluteus o Change dressing every day. - More often as needed. Wound #5 Midline Sacrum o Change dressing every day. - More often as needed. Follow-up Appointments Wound #1 Left Upper Arm o Return Appointment in 1 week. Wound #2 Left,Proximal Gluteus o Return Appointment in 1 week. Wound #3 Right Gluteus o Return Appointment in 1 week. Wound #4 Left,Medial Gluteus o Return Appointment in 1 week. Wound #5 Midline Sacrum o Return Appointment in 1 week. Off-Loading Wound #1 Left Upper Arm o Roho cushion for wheelchair o Mattress - Air mattress o Turn and reposition every 2 hours RAINE, BLODGETT ([id number]) Wound #2 Left,Proximal Gluteus o Roho cushion for wheelchair o Mattress - Air mattress o Turn and reposition every 2 hours Wound #3 Right Gluteus o Roho cushion for wheelchair o Mattress - [employer] o Turn  and reposition every 2 hours Wound #4 Left,Medial Gluteus o Roho cushion for wheelchair o Mattress - Air mattress o Turn and reposition every 2 hours Wound #5 Midline Sacrum o Roho cushion for wheelchair o Mattress - Air mattress o Turn and reposition every 2 hours Additional Orders / Instructions Wound #1 Left Upper Arm o Increase protein intake. Wound #2 Left,Proximal Gluteus o Increase protein intake. Wound #3 Right Gluteus o Increase protein intake. Wound #4 Left,Medial Gluteus o Increase protein intake. Wound #5 Midline Sacrum o Increase protein intake. Medications-please add to medication list. Wound #1 Left Upper Arm o Santyl Enzymatic Ointment Wound #2 Left,Proximal Gluteus o Santyl Enzymatic Ointment Laboratory o Bacteria identified in Wound by Culture (MICRO) - Left Distal Gluteus oooo LOINC Code: 0109-3 oooo Convenience Name: Wound culture routine Electronic Signature(s) Signed: 03/27/2018 6:18:32 PM By: Linton Ham MD Signed: 03/27/2018 6:42:35 PM By: Gretta Cool BSN, RN, CWS, Kim RN, BSN [place], [place] ([id number]) Entered By: Gretta Cool, BSN, RN, CWS, Kim on 03/27/2018 14:23:56 Heather Petty ([id number]) -------------------------------------------------------------------------------- Problem List Details Patient Name: Heather Petty Date of Service: 03/27/2018 1:00 PM Medical Record Number: [id number] Patient Account Number: [id number] Date of Birth/Sex: August 20, 1950 (67 y.o. 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, stage 4 03/27/2018 No Yes L89.152 Pressure ulcer of sacral region, stage 2 03/27/2018 No Yes L89.312 Pressure ulcer of right buttock, stage 2 03/27/2018 No Yes L89.322 Pressure ulcer of left buttock, stage 2 03/27/2018 No Yes T80.90XS Unspecified complication following infusion and therapeutic 03/27/2018 No Yes injection, sequela L98.498 Non-pressure chronic ulcer of skin of other sites with other 03/27/2018 No Yes specified severity E11.622 Type 2 diabetes mellitus with other skin ulcer 03/27/2018 No Yes Inactive Problems Resolved Problems Electronic Signature(s) Signed: 03/27/2018 6:18:32 PM By: Linton Ham MD Entered By: Linton Ham on 03/27/2018 14:30:42 Heather Petty ([id number]) -------------------------------------------------------------------------------- Progress Note Details Patient Name: Heather Petty Date of Service: 03/27/2018 1:00 PM Medical Record Number: [id number] Patient Account Number: [id number] Date of Birth/Sex: 21-Sep-1950 (68 y.o. F) Treating RN: Cornell Barman Primary Care Provider: [name] Other Clinician: Referring Provider: Brain Hilts Treating  Provider/Extender: Tito Dine in Treatment: 0 Subjective Chief Complaint Information obtained from Patient 03/27/2018; patient is here for review of multiple skin wounds History of Present Illness (HPI) ADMISSION 03/28/2017 This is a 68 year old woman who is currently at [facility] skilled facility. She has had several recent admissions to hospital starting in the early part of January 2020. She was admitted to [facility] from 02/05/2018 through 02/09/2018. At that point she had open wounds on her back. Diffuse dermatitis with multiple skin wounds. She was referred to [facility] where she stayed from 1/3 through 1/16. She had a biopsy done of the skin areas that showed an unclear dermatitis. 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 Per dermatology rule out nutritional deficiencies. She was also seen by wound care and plastics. She is also been hospitalized from 1/23 through 1/28 with acute kidney injury and hyperglycemia. At that point noted to have "chronic sacral decubitus]. Finally she was hospitalized from 2/6 through 2/12 again with hyperkalemia asthma and pneumonia and hypotension acute on chronic kidney injury. She comes in today with a deep open area on her left upper gluteal at the superior margin of her pelvis.  This had 3 separate openings deep and necrotic. She has more standard pressure ulcers over her lower sacrum and close surrounding buttocks stage II. Finally she has a necrotic wound on her left upper arm. Apparently this was caused by an infusion injury while she has been in the hospital question IV Lasix that extravasated. I do not see that commented on any of the material I read but that has the history I was given today. Past medical history includes type 2 diabetes on insulin asthma gastroesophageal reflux disease gout schizophrenia chronic renal failure stage III Wound History Patient presents with 2 open wounds that  have been present for approximately 2 months. Patient has been treating wounds in the following manner: santyl. Laboratory tests have not been performed in the last month. Patient reportedly has not tested positive for an antibiotic resistant organism. Patient reportedly has not tested positive for osteomyelitis. Patient reportedly has not had testing performed to evaluate circulation in the legs. Patient History Information obtained from Patient, Chart. Allergies No Known Drug Allergies Family History No family history of Cancer, Diabetes, Heart Disease, Hereditary Spherocytosis, Hypertension, Kidney Disease, Lung Disease, Seizures, Stroke, Thyroid Problems, Tuberculosis. Social History Former smoker - quit when I was 23, Alcohol Use - Never, Drug Use - No History, Caffeine Use - Daily. VERENA, SHAWGO ([id number]) Medical History Eyes Denies history of Cataracts, Glaucoma, Optic Neuritis Ear/Nose/Mouth/Throat Denies history of Chronic sinus problems/congestion, Middle ear problems Respiratory Patient has history of Asthma Denies history of Aspiration, Chronic Obstructive Pulmonary Disease (COPD), Pneumothorax, Sleep Apnea, Tuberculosis Cardiovascular Patient has history of Hypertension Denies history of Angina, Arrhythmia, Congestive Heart Failure, Coronary Artery Disease, Deep Vein Thrombosis, Hypotension, Myocardial Infarction, Peripheral Arterial Disease, Peripheral Venous Disease, Phlebitis, Vasculitis Gastrointestinal Denies history of Cirrhosis , Colitis, Crohn s, Hepatitis A, Hepatitis B, Hepatitis C Endocrine Patient has history of Type II Diabetes Denies history of Type I Diabetes Genitourinary Denies history of End Stage Renal Disease Immunological Denies history of Lupus Erythematosus, Raynaud s, Scleroderma Integumentary (Skin) Denies history of History of Burn, History of pressure wounds Musculoskeletal Patient has history of Osteoarthritis Denies history of  Gout, Rheumatoid Arthritis, Osteomyelitis Neurologic Patient has history of Neuropathy Denies history of Dementia, Quadriplegia, Paraplegia, Seizure Disorder Oncologic Denies history of Received Chemotherapy, Received Radiation Psychiatric Denies history of Anorexia/bulimia, Confinement Anxiety Patient is treated with Insulin. Review of Systems (ROS) Constitutional Symptoms (General Health) Denies complaints or symptoms of Fatigue, Fever, Chills, Marked Weight Change. Eyes Denies complaints or symptoms of Dry Eyes, Vision Changes, Glasses / Contacts. Ear/Nose/Mouth/Throat Denies complaints or symptoms of Difficult clearing ears, Sinusitis. Respiratory Denies complaints or symptoms of Chronic or frequent coughs, Shortness of Breath. Cardiovascular Denies complaints or symptoms of Chest pain, LE edema. Gastrointestinal Denies complaints or symptoms of Frequent diarrhea, Nausea, Vomiting. Endocrine Denies complaints or symptoms of Hepatitis, Thyroid disease, Polydypsia (Excessive Thirst). Genitourinary Denies complaints or symptoms of Kidney failure/ Dialysis, Incontinence/dribbling. Immunological Denies complaints or symptoms of Hives, Itching. Integumentary (Skin) Complains or has symptoms of Wounds. Denies complaints or symptoms of Bleeding or bruising tendency, Breakdown, Swelling. Musculoskeletal DOVIE, KAPUSTA ([id number]) Denies complaints or symptoms of Muscle Pain, Muscle Weakness. Neurologic Denies complaints or symptoms of Numbness/parasthesias, Focal/Weakness. Psychiatric Denies complaints or symptoms of Anxiety, Claustrophobia. Objective Constitutional Sitting or standing Blood Pressure is within target range for patient.. Pulse regular and within target range for patient.Marland Kitchen Respirations regular, non-labored and within target range.. Temperature is normal and within the target range for the patient.Marland Kitchen appears in no distress. Vitals Time  Taken: 1:01 PM, Height: 61  in, Source: Stated, Weight: 273 lbs, Source: Stated, BMI: 51.6, Temperature: 98.2 F, Pulse: 92 bpm, Respiratory Rate: 16 breaths/min, Blood Pressure: 128/60 mmHg. Eyes Conjunctivae clear. No discharge. Respiratory Respiratory effort is easy and symmetric bilaterally. Rate is normal at rest and on room air.. Bilateral breath sounds are clear and equal in all lobes with no wheezes, rales or rhonchi.. Cardiovascular Heart rhythm and rate regular, without murmur or gallop.. Gastrointestinal (GI) Abdomen is soft and non-distended without masses or tenderness. Bowel sounds active in all quadrants.. No liver or spleen enlargement or tenderness.. Musculoskeletal Active arthritis in the right first second and third MTPs and ankle. Psychiatric No evidence of depression, anxiety, or agitation. Calm, cooperative, and communicative. Appropriate interactions and affect.. General Notes: Wound exam The major area here is on the left upper gluteal/buttock. This is at the rim of the pelvis. There are 3 open areas here to of which connect and I think the third and most lateral at one point connected. Using pickups and a #15 scalpel I separated the skin of the more proximal undermining areas. Hemostasis with silver nitrate. Then using an open curette necrotic debris which I think is largely muscle in this wound that goes clearly down to muscle. A culture was done She has also more standard pressure ulcers on the sacrum and proximal left and right buttock which are stage II wounds Finally she has an area just at the proximal upper arm anteriorly. This is covered in a necrotic eschar. Using injectable lidocaine the area was anesthetized and using pickups and scalpel the eschar and some necrotic debris was removed. This was apparently an infusion injury although I do not see that anywhere on her hospital paperwork although admittedly I have not looked through every word of 3 difficult recent  admissions Integumentary (Hair, Skin) Widespread vitiligo. She has some erythema on her skin especially in the left flanks but not particularly tender. MECKENZIE, BALSLEY ([id number]) Wound #1 status is Open. Original cause of wound was Trauma. The wound is located on the Left Upper Arm. The wound measures 1.5cm length x 4.1cm width x 0.1cm depth; 4.83cm^2 area and 0.483cm^3 volume. There is no tunneling or undermining noted. There is a none present amount of drainage noted. The wound margin is flat and intact. There is no granulation within the wound bed. There is a large (67-100%) amount of necrotic tissue within the wound bed including Eschar. The periwound skin appearance did not exhibit: Callus, Crepitus, Excoriation, Induration, Rash, Scarring, Dry/Scaly, Maceration, Atrophie Blanche, Cyanosis, Ecchymosis, Hemosiderin Staining, Mottled, Pallor, Rubor, Erythema. Wound #2 status is Open. Original cause of wound was Pressure Injury. The wound is located on the Left,Proximal Gluteus. The wound measures 4cm length x 13cm width x 2.6cm depth; 40.841cm^2 area and 106.186cm^3 volume. There is muscle and Fat Layer (Subcutaneous Tissue) Exposed exposed. There is no tunneling noted, however, there is undermining starting at 12:00 and ending at 6:00 with a maximum distance of 2cm. There is a large amount of serosanguineous drainage noted. The wound margin is well defined and not attached to the wound base. There is no granulation within the wound bed. There is a large (67-100%) amount of necrotic tissue within the wound bed including Adherent Slough and Necrosis of Muscle. The periwound skin appearance exhibited: Scarring. The periwound skin appearance did not exhibit: Callus, Crepitus, Excoriation, Induration, Rash, Dry/Scaly, Maceration, Atrophie Blanche, Cyanosis, Ecchymosis, Hemosiderin Staining, Mottled, Pallor, Rubor, Erythema. Periwound temperature was noted as No Abnormality. Wound #  3 status is  Open. Original cause of wound was Pressure Injury. The wound is located on the Right Gluteus. The wound measures 4cm length x 3.5cm width x 0.1cm depth; 10.996cm^2 area and 1.1cm^3 volume. There is Fat Layer (Subcutaneous Tissue) Exposed exposed. There is no tunneling or undermining noted. There is a medium amount of serous drainage noted. The wound margin is flat and intact. There is large (67-100%) granulation within the wound bed. There is a small (1-33%) amount of necrotic tissue within the wound bed including Adherent Slough. The periwound skin appearance did not exhibit: Callus, Crepitus, Excoriation, Induration, Rash, Scarring, Dry/Scaly, Maceration, Atrophie Blanche, Cyanosis, Ecchymosis, Hemosiderin Staining, Mottled, Pallor, Rubor, Erythema. The periwound has tenderness on palpation. Wound #4 status is Open. Original cause of wound was Pressure Injury. The wound is located on the Left,Medial Gluteus. The wound measures 2.6cm length x 2.5cm width x 0.1cm depth; 5.105cm^2 area and 0.511cm^3 volume. There is Fat Layer (Subcutaneous Tissue) Exposed exposed. There is no tunneling or undermining noted. There is a medium amount of serous drainage noted. The wound margin is flat and intact. There is large (67-100%) granulation within the wound bed. There is a small (1-33%) amount of necrotic tissue within the wound bed including Adherent Slough. The periwound skin appearance did not exhibit: Callus, Crepitus, Excoriation, Induration, Rash, Scarring, Dry/Scaly, Maceration, Atrophie Blanche, Cyanosis, Ecchymosis, Hemosiderin Staining, Mottled, Pallor, Rubor, Erythema. Wound #5 status is Open. Original cause of wound was Pressure Injury. The wound is located on the Midline Sacrum. The wound measures 0.7cm length x 2.1cm width x 0.4cm depth; 1.155cm^2 area and 0.462cm^3 volume. There is Fat Layer (Subcutaneous Tissue) Exposed exposed. There is no tunneling or undermining noted. There is a medium amount  of serous drainage noted. The wound margin is flat and intact. There is large (67-100%) granulation within the wound bed. There is a small (1-33%) amount of necrotic tissue within the wound bed including Adherent Slough. The periwound skin appearance did not exhibit: Callus, Crepitus, Excoriation, Induration, Rash, Scarring, Dry/Scaly, Maceration, Atrophie Blanche, Cyanosis, Ecchymosis, Hemosiderin Staining, Mottled, Pallor, Rubor, Erythema. The periwound has tenderness on palpation. 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 There was a Selective/Open Wound Non-Viable Tissue Debridement with a total area of 6.15 sq cm performed by Ricard Dillon, MD. With the following instrument(s): Blade, and Forceps to remove Non-Viable tissue/material. Material removed includes Eschar after achieving pain control using Lidocaine Injectable. No specimens were taken. A time out was conducted at 13:55, prior to the start of the procedure. A Minimum amount of bleeding was controlled with Pressure. The procedure was tolerated well. Post Debridement Measurements: 1.5cm length x 4.1cm width x 0.3cm depth; 1.449cm^3 volume. Character of Wound/Ulcer Post Debridement is stable. Post procedure Diagnosis Wound #1: Same as Pre-Procedure Wound #2 Pre-procedure diagnosis of Wound #2 is a Pressure Ulcer located on the Left,Proximal Gluteus . There was a Excisional Skin/Subcutaneous Tissue/Muscle Debridement with a total area  of 16 sq cm performed by Ricard Dillon, MD. With the following instrument(s): Blade, Curette, and Forceps to remove Viable  and Non-Viable tissue/material. Material removed includes Muscle, Subcutaneous Tissue, Slough, and Skin: Dermis after achieving pain control using Lidocaine Injectable: 2%. No specimens were taken. A time out was conducted at 13:55, prior to the start of the procedure. A Large amount of bleeding was controlled with Silver Nitrate. The procedure was tolerated well. Post Debridement Measurements: 4cm length x 13cm width x 2.6cm depth; 106.186cm^3 volume. Post debridement Stage noted as Category/Stage IV. Character of Wound/Ulcer Post Debridement is stable. Post procedure Diagnosis Wound #2: Same as Pre-Procedure Plan Wound Cleansing: Wound #1 Left Upper Arm: Clean wound with Normal Saline. Wound #2 Left,Proximal Gluteus: Clean wound with Normal Saline. Wound #3 Right Gluteus: Clean wound with Normal Saline. Wound #4 Left,Medial Gluteus: Clean wound with Normal Saline. Wound #5 Midline Sacrum: Clean wound with Normal Saline. Anesthetic (add to Medication List): Wound #1 Left Upper Arm: Topical Lidocaine 4% cream applied to wound bed prior to debridement (In Clinic Only). Injected 2% Xylocaine MPF prior to debridement (In Clinic Only). Wound #2 Left,Proximal Gluteus: Topical Lidocaine 4% cream applied to wound bed prior to debridement (In Clinic Only). Injected 2% Xylocaine MPF prior to debridement (In Clinic Only). Wound #3 Right Gluteus: Topical Lidocaine 4% cream applied to wound bed prior to debridement (In Clinic Only). Wound #4 Left,Medial Gluteus: Topical Lidocaine 4% cream applied to wound bed prior to debridement (In Clinic Only). Primary Wound Dressing: Wound #1 Left Upper Arm: ALLYSSON, RINEHIMER ([id number]) Santyl Ointment Wound #2 Left,Proximal Gluteus: Santyl Ointment Wound #3 Right Gluteus: Silver Alginate Wound #4 Left,Medial Gluteus: Silver  Alginate Wound #5 Midline Sacrum: Silver Alginate Secondary Dressing: Wound #1 Left Upper Arm: Boardered Foam Dressing Wound #2 Left,Proximal Gluteus: Other - wet-to-dry ABD to secure Wound #3 Right Gluteus: Boardered Foam Dressing Wound #4 Left,Medial Gluteus: Boardered Foam Dressing Wound #5 Midline Sacrum: Boardered Foam Dressing Dressing Change Frequency: Wound #1 Left Upper Arm: Change dressing every day. - More often as needed. Wound #2 Left,Proximal Gluteus: Change dressing every day. - More often as needed. Wound #3 Right Gluteus: Change dressing every day. - More often as needed. Wound #4 Left,Medial Gluteus: Change dressing every day. - More often as needed. Wound #5 Midline Sacrum: Change dressing every day. - More often as needed. Follow-up Appointments: Wound #1 Left Upper Arm: Return Appointment in 1 week. Wound #2 Left,Proximal Gluteus: Return Appointment in 1 week. Wound #3 Right Gluteus: Return Appointment in 1 week. Wound #4 Left,Medial Gluteus: Return Appointment in 1 week. Wound #5 Midline Sacrum: Return Appointment in 1 week. Off-Loading: Wound #1 Left Upper Arm: Roho cushion for wheelchair Mattress - Air mattress Turn and reposition every 2 hours Wound #2 Left,Proximal Gluteus: Roho cushion for wheelchair Mattress - Air mattress Turn and reposition every 2 hours Wound #3 Right Gluteus: Roho cushion for wheelchair Mattress - Air mattress Turn and reposition every 2 hours Wound #4 Left,Medial Gluteus: Roho cushion for wheelchair Mattress - Air mattress Turn and reposition every 2 hours Wound #5 Midline SacrumDEBBIE, YEARICK ([id number]) Roho cushion for wheelchair Mattress - Air mattress Turn and reposition every 2 hours Additional Orders / Instructions: Wound #1 Left Upper Arm: Increase protein intake. Wound #2 Left,Proximal Gluteus: Increase protein intake. Wound #3 Right Gluteus: Increase protein intake. Wound #4 Left,Medial  Gluteus: Increase protein intake. Wound #5 Midline Sacrum: Increase protein intake. Medications-please add to medication list.: Wound #1 Left Upper Arm: Santyl Enzymatic Ointment Wound #2 Left,Proximal Gluteus: Santyl Enzymatic Ointment Laboratory ordered were: Wound culture routine - Left Distal Gluteus 1. Left  upper gluteal area which is just below the rim of her pelvis. A multi-opening necrotic wound area. This does not look like the appearance of a typical pressure ulcer however he clearly goes down to muscle. I wonder if this was an abscess at one point with multiple accidents. In any case an extensive difficult debridement as noted. We will continue to use Santyl wet- to-dry and border foam on this area 2. Silver alginate and border foam to the sacrum and proximal buttock wounds which are stage II 3. Santyl to the apparent infusion injury on the left upper arm. It is difficult for me to see this unless a PICC line extravasated 4. We will see her back next week 5. 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 Electronic Signature(s) Signed: 03/27/2018 6:18:32 PM By: Linton Ham MD Entered By: Linton Ham on 03/27/2018 15:11:42 Heather Petty ([id number]) -------------------------------------------------------------------------------- ROS/PFSH Details Patient Name: Heather Petty Date of Service: 03/27/2018 1:00 PM Medical Record Number: [id number] Patient Account Number: [id number] Date of Birth/Sex: 09-23-1950 (67 y.o. F) Treating RN: Montey Hora Primary Care Provider: [name] Other Clinician: Referring Provider: Brain Hilts Treating Provider/Extender: Tito Dine in Treatment: 0 Information Obtained From Patient Chart Wound History Do you currently have one or more open woundso Yes How many open wounds do you currently haveo 2 Approximately how long have  you had your woundso 2 months How have you been treating your wound(s) until nowo santyl Has your wound(s) ever healed and then re-openedo No Have you had any lab work done in the past montho No Have you tested positive for an antibiotic resistant organism (MRSA, VRE)o No Have you tested positive for osteomyelitis (bone infection)o No Have you had any tests for circulation on your legso No Constitutional Symptoms (General Health) Complaints and Symptoms: Negative for: Fatigue; Fever; Chills; Marked Weight Change Eyes Complaints and Symptoms: Negative for: Dry Eyes; Vision Changes; Glasses / Contacts Medical History: Negative for: Cataracts; Glaucoma; Optic Neuritis Ear/Nose/Mouth/Throat Complaints and Symptoms: Negative for: Difficult clearing ears; Sinusitis Medical History: Negative for: Chronic sinus problems/congestion; Middle ear problems Respiratory Complaints and Symptoms: Negative for: Chronic or frequent coughs; Shortness of Breath Medical History: Positive for: Asthma Negative for: Aspiration; Chronic Obstructive Pulmonary Disease (COPD); Pneumothorax; Sleep Apnea; Tuberculosis Cardiovascular Complaints and Symptoms: Negative for: Chest pain; LE edema Medical HistoryVENNESSA, AFFINITO ([id number]) Positive for: Hypertension Negative for: Angina; Arrhythmia; Congestive Heart Failure; Coronary Artery Disease; Deep Vein Thrombosis; Hypotension; Myocardial Infarction; Peripheral Arterial Disease; Peripheral Venous Disease; Phlebitis; Vasculitis Gastrointestinal Complaints and Symptoms: Negative for: Frequent diarrhea; Nausea; Vomiting Medical History: Negative for: Cirrhosis ; Colitis; Crohnos; Hepatitis A; Hepatitis B; Hepatitis C Endocrine Complaints and Symptoms: Negative for: Hepatitis; Thyroid disease; Polydypsia (Excessive Thirst) Medical History: Positive for: Type II Diabetes Negative for: Type I Diabetes Treated with: Insulin Genitourinary Complaints and  Symptoms: Negative for: Kidney failure/ Dialysis; Incontinence/dribbling Medical History: Negative for: End Stage Renal Disease Immunological Complaints and Symptoms: Negative for: Hives; Itching Medical History: Negative for: Lupus Erythematosus; Raynaudos; Scleroderma Integumentary (Skin) Complaints and Symptoms: Positive for: Wounds Negative for: Bleeding or bruising tendency; Breakdown; Swelling Medical History: Negative for: History of Burn; History of pressure wounds Musculoskeletal Complaints and Symptoms: Negative for: Muscle Pain; Muscle Weakness Medical History: Positive for: Osteoarthritis Negative for: Gout; Rheumatoid Arthritis; Osteomyelitis Neurologic Tenpenny, Latayna ([id number]) Complaints and Symptoms: Negative for: Numbness/parasthesias; Focal/Weakness Medical History: Positive for: Neuropathy Negative for: Dementia; Quadriplegia; Paraplegia;  Seizure Disorder Psychiatric Complaints and Symptoms: Negative for: Anxiety; Claustrophobia Medical History: Negative for: Anorexia/bulimia; Confinement Anxiety Oncologic Medical History: Negative for: Received Chemotherapy; Received Radiation Immunizations Pneumococcal Vaccine: Received Pneumococcal Vaccination: Yes Implantable Devices Family and Social History Cancer: No; Diabetes: No; Heart Disease: No; Hereditary Spherocytosis: No; Hypertension: No; Kidney Disease: No; Lung Disease: No; Seizures: No; Stroke: No; Thyroid Problems: No; Tuberculosis: No; Former smoker - quit when I was 23; Alcohol Use: Never; Drug Use: No History; Caffeine Use: Daily; Financial Concerns: No; Food, Clothing or Shelter Needs: No; Support System Lacking: No; Transportation Concerns: No; Advanced Directives: No; Patient does not want information on Advanced Directives Notes Patient does not know family history R/T being adopted Electronic Signature(s) Signed: 03/27/2018 5:36:16 PM By: Montey Hora Signed: 03/27/2018 6:18:32 PM  By: Linton Ham MD Entered By: Montey Hora on 03/27/2018 13:22:18 Heather Petty ([id number]) -------------------------------------------------------------------------------- SuperBill Details Patient Name: Heather Petty Date of Service: 03/27/2018 Medical Record Number: [id number] Patient Account Number: [id number] Date of Birth/Sex: May 10, 1950 (67 y.o. F) Treating RN: Cornell Barman Primary Care Provider: [name] Other Clinician: Referring Provider: Brain Hilts Treating Provider/Extender: Tito Dine in Treatment: 0 Diagnosis Coding ICD-10 Codes Code Description [phone] Pressure ulcer of left buttock, stage 4 L89.152 Pressure ulcer of sacral region, stage 2 L89.312 Pressure ulcer of right buttock, stage 2 L89.322 Pressure ulcer of left buttock, stage 2 T80.90XS Unspecified complication following infusion and therapeutic injection, sequela L98.498 Non-pressure chronic ulcer of skin of other sites with other specified severity E11.622 Type 2 diabetes mellitus with other skin ulcer Facility Procedures CPT4 Code Description: [id number] 99212 - WOUND CARE VISIT-LEV 2 EST PT Modifier: Quantity: 1 CPT4 Code Description: [id number] 11043 - DEB MUSC/FASCIA 20 SQ CM/< ICD-10 Diagnosis Description L89.324 Pressure ulcer of left buttock, stage 4 Modifier: Quantity: 1 CPT4 Code Description: [id number] 97597 - DEBRIDE WOUND 1ST 20 SQ CM OR < ICD-10 Diagnosis Description L98.498 Non-pressure chronic ulcer of skin of other sites with other spec Modifier: 59 ified severity Quantity: 1 Physician Procedures CPT4 Code Description: [id number] 87579 - WC PHYS LEVEL 4 - NEW PT ICD-10 Diagnosis Description L89.324 Pressure ulcer of left buttock, stage 4 L98.498 Non-pressure chronic ulcer of skin of other sites with other spec L89.152 Pressure ulcer of sacral  region, stage 2 L89.312 Pressure ulcer of right buttock, stage 2 Modifier: 25 ified severity Quantity: 1 CPT4 Code  Description: [id number] 11043 - WC PHYS DEBR MUSCLE/FASCIA 20 SQ CM ICD-10 Diagnosis Description L89.324 Pressure ulcer of left buttock, stage 4 Modifier: Quantity: 1 CPT4 Code Description: [id number] 94327 - WC PHYS DEBR WO ANESTH 20 SQ CM ICD-10 Diagnosis Description L98.498 Non-pressure chronic ulcer of skin of other sites with other spec JAGGER, BEAHM ([id number]) Modifier: 59 ified severity Quantity: 1 Electronic Signature(s) Signed: 03/27/2018 6:21:17 PM By: Gretta Cool, BSN, RN, CWS, Kim RN, BSN Previous Signature: 03/27/2018 6:18:32 PM Version By: Linton Ham MD Entered By: Gretta Cool, BSN, RN, CWS, Kim on 03/27/2018 18:21:17

## 2018-03-28 NOTE — Progress Notes (Addendum)
Heather Petty ([id number]) Visit Report for 03/27/2018 Allergy List Details Patient Name: Heather Petty Date of Service: 03/27/2018 1:00 PM Medical Record Number: [id number] Patient Account Number: [id number] Date of Birth/Sex: Aug 03, 1950 (67 y.o. F) Treating RN: Montey Hora Primary Care Vernella Niznik: [name] Other Clinician: Referring Alvester Eads: Brain Hilts Treating Evynn Boutelle/Extender: Ricard Dillon Weeks in Treatment: 0 Allergies Active Allergies No Known Drug Allergies Allergy Notes Electronic Signature(s) Signed: 03/27/2018 5:36:16 PM By: Montey Hora Entered By: Montey Hora on 03/27/2018 13:10:56 Heather Petty ([id number]) -------------------------------------------------------------------------------- Arrival Information Details Patient Name: Heather Petty Date of Service: 03/27/2018 1:00 PM Medical Record Number: [id number] Patient Account Number: [id number] Date of Birth/Sex: 07-Sep-1950 (67 y.o. F) Treating RN: Cornell Barman Primary Care Kristine Chahal: [name] Other Clinician: Referring Mackenzie Lia: Brain Hilts Treating Aarushi Hemric/Extender: Tito Dine in Treatment: 0 Visit Information Patient Arrived: Wheel Chair Arrival Time: 13:00 Accompanied By: caregiver Transfer Assistance: Harrel Lemon Lift Patient Identification Verified: Yes Secondary Verification Process Completed: Yes Electronic Signature(s) Signed: 03/27/2018 2:49:55 PM By: Lorine Bears RCP, RRT, CHT Entered By: Lorine Bears on 03/27/2018 13:01:19 Heather Petty ([id number]) -------------------------------------------------------------------------------- Clinic Level of Care Assessment Details Patient Name: Heather Petty Date of Service: 03/27/2018 1:00 PM Medical Record Number: [id number] Patient Account Number: [id number] Date of Birth/Sex: 07/13/1950 (67 y.o. F) Treating RN: Cornell Barman Primary Care Montgomery Favor: [name] Other  Clinician: Referring Shailah Gibbins: Brain Hilts Treating Tashena Ibach/Extender: Tito Dine in Treatment: 0 Clinic Level of Care Assessment Items TOOL 1 Quantity Score []  - Use when EandM and Procedure is performed on INITIAL visit 0 ASSESSMENTS - Nursing Assessment / Reassessment X - General Physical Exam (combine w/ comprehensive assessment (listed just below) when 1 20 performed on new pt. evals) X- 1 25 Comprehensive Assessment (HX, ROS, Risk Assessments, Wounds Hx, etc.) ASSESSMENTS - Wound and Skin Assessment / Reassessment []  - Dermatologic / Skin Assessment (not related to wound area) 0 ASSESSMENTS - Ostomy and/or Continence Assessment and Care []  - Incontinence Assessment and Management 0 []  - 0 Ostomy Care Assessment and Management (repouching, etc.) PROCESS - Coordination of Care X - Simple Patient / Family Education for ongoing care 1 15 []  - 0 Complex (extensive) Patient / Family Education for ongoing care []  - 0 Staff obtains Programmer, systems, Records, Test Results / Process Orders []  - 0 Staff telephones HHA, Nursing Homes / Clarify orders / etc []  - 0 Routine Transfer to another Facility (non-emergent condition) []  - 0 Routine Hospital Admission (non-emergent condition) X- 1 15 New Admissions / Biomedical engineer / Ordering NPWT, Apligraf, etc. []  - 0 Emergency Hospital Admission (emergent condition) PROCESS - Special Needs []  - Pediatric / Minor Patient Management 0 []  - 0 Isolation Patient Management []  - 0 Hearing / Language / Visual special needs []  - 0 Assessment of Community assistance (transportation, D/C planning, etc.) []  - 0 Additional assistance / Altered mentation []  - 0 Support Surface(s) Assessment (bed, cushion, seat, etc.) Heather Petty, Heather Petty ([id number]) INTERVENTIONS - Miscellaneous []  - External ear exam 0 []  - 0 Patient Transfer (multiple staff / Civil Service fast streamer / Similar devices) []  - 0 Simple Staple / Suture removal (25 or  less) []  - 0 Complex Staple / Suture removal (26 or more) []  - 0 Hypo/Hyperglycemic Management (do not check if billed separately) []  - 0 Ankle / Brachial Index (ABI) - do not check if billed separately Has the patient been seen at the hospital within the last three years: Yes Total Score: 75 Level Of Care: New/Established - Level 2  Electronic Signature(s) Signed: 03/27/2018 6:42:35 PM By: Gretta Cool, BSN, RN, CWS, Kim RN, BSN Entered By: Gretta Cool, BSN, RN, CWS, Kim on 03/27/2018 18:20:34 Heather Petty ([id number]) -------------------------------------------------------------------------------- Encounter Discharge Information Details Patient Name: Heather Petty Date of Service: 03/27/2018 1:00 PM Medical Record Number: [id number] Patient Account Number: [id number] Date of Birth/Sex: 06-18-1950 (67 y.o. F) Treating RN: Harold Barban Primary Care Taiya Nutting: [name] Other Clinician: Referring Grayling Schranz: Brain Hilts Treating Lorn Butcher/Extender: Tito Dine in Treatment: 0 Encounter Discharge Information Items Post Procedure Vitals Discharge Condition: Stable Temperature (F): 98.2 Ambulatory Status: Wheelchair Pulse (bpm): 92 Discharge Destination: Home Respiratory Rate (breaths/min): 16 Transportation: Private Auto Blood Pressure (mmHg): 128/60 Accompanied By: caregiver Schedule Follow-up Appointment: Yes Clinical Summary of Care: Electronic Signature(s) Signed: 03/28/2018 8:38:49 AM By: Harold Barban Entered By: Harold Barban on 03/28/2018 08:38:48 Heather Petty ([id number]) -------------------------------------------------------------------------------- Lower Extremity Assessment Details Patient Name: Heather Petty Date of Service: 03/27/2018 1:00 PM Medical Record Number: [id number] Patient Account Number: [id number] Date of Birth/Sex: 11-17-1950 (67 y.o. F) Treating RN: Montey Hora Primary Care Makaylen Thieme: [name] Other  Clinician: Referring Zandrea Kenealy: Brain Hilts Treating Ivet Guerrieri/Extender: Ricard Dillon Weeks in Treatment: 0 Electronic Signature(s) Signed: 03/27/2018 5:36:16 PM By: Montey Hora Entered By: Montey Hora on 03/27/2018 13:22:29 Heather Petty ([id number]) -------------------------------------------------------------------------------- Multi Wound Chart Details Patient Name: Heather Petty Date of Service: 03/27/2018 1:00 PM Medical Record Number: [id number] Patient Account Number: [id number] Date of Birth/Sex: 16-Feb-1950 (68 y.o. F) Treating RN: Cornell Barman Primary Care Breuna Loveall: [name] Other Clinician: Referring Kerina Simoneau: Brain Hilts Treating Cynitha Berte/Extender: Tito Dine in Treatment: 0 Vital Signs Height(in): 61 Pulse(bpm): 33 Weight(lbs): 273 Blood Pressure(mmHg): 128/60 Body Mass Index(BMI): 52 Temperature(F): 98.2 Respiratory Rate 16 (breaths/min): Photos: [1:No Photos] [2:No Photos] [3:No Photos] Wound Location: [1:Left Axilla] [2:Left Back - Distal] [3:Right Gluteus] Wounding Event: [1:Trauma] [2:Pressure Injury] [3:Pressure Injury] Primary Etiology: [1:Trauma, Other] [2:Pressure Ulcer] [3:Pressure Ulcer] Comorbid History: [1:Asthma, Hypertension, Type II Asthma, Hypertension, Type II Diabetes, Osteoarthritis, Neuropathy] [2:Diabetes, Osteoarthritis, Neuropathy] [3:Asthma, Hypertension, Type II Diabetes, Osteoarthritis, Neuropathy] Date Acquired: [1:01/24/2018] [2:01/24/2018] [3:01/24/2018] Weeks of Treatment: [1:0] [2:0] [3:0] Wound Status: [1:Open] [2:Open] [3:Open] Measurements L x W x D [1:1.5x4.1x0.1] [2:4x13x2.6] [3:4x3.5x0.1] (cm) Area (cm) : [1:4.83] [2:40.841] [3:10.996] Volume (cm) : [1:0.483] [2:106.186] [3:1.1] Starting Position 1 [2:12] (o'clock): Ending Position 1 [2:6] (o'clock): Maximum Distance 1 (cm): [2:2] Undermining: [1:No] [2:Yes] [3:No] Classification: [1:Full Thickness Without Exposed Support  Structures] [2:Category/Stage III] [3:Category/Stage II] Exudate Amount: [1:None Present] [2:Large] [3:Medium] Exudate Type: [1:N/A] [2:Serosanguineous] [3:Serous] Exudate Color: [1:N/A] [2:red, brown] [3:amber] Wound Margin: [1:Flat and Intact] [2:N/A] [3:Flat and Intact] Granulation Amount: [1:None Present (0%)] [2:None Present (0%)] [3:Large (67-100%)] Necrotic Amount: [1:Large (67-100%)] [2:Large (67-100%)] [3:Small (1-33%)] Necrotic Tissue: [1:Eschar] [2:Adherent Slough] [3:Adherent Slough] Exposed Structures: [1:Fascia: No Fat Layer (Subcutaneous Tissue) Exposed: No Tendon: No Muscle: No Joint: No Bone: No] [2:Fat Layer (Subcutaneous Tissue) Exposed: Yes Fascia: No Tendon: No Muscle: No Joint: No Bone: No] [3:Fat Layer (Subcutaneous Tissue) Exposed: Yes  Fascia: No Tendon: No Muscle: No Joint: No Bone: No] Epithelialization: [1:None] [2:None] [3:None] Periwound Skin Texture: Excoriation: No Scarring: Yes Excoriation: No Induration: No Excoriation: No Induration: No Callus: No Induration: No Callus: No Crepitus: No Callus: No Crepitus: No Rash: No Crepitus: No Rash: No Scarring: No Rash: No Scarring: No Periwound Skin Moisture: Maceration: No Maceration: No Maceration: No Dry/Scaly: No Dry/Scaly: No Dry/Scaly: No Periwound Skin Color: Atrophie Blanche: No Atrophie Blanche: No Atrophie Blanche: No Cyanosis: No Cyanosis: No Cyanosis: No Ecchymosis: No Ecchymosis: No Ecchymosis:  No Erythema: No Erythema: No Erythema: No Hemosiderin Staining: No Hemosiderin Staining: No Hemosiderin Staining: No Mottled: No Mottled: No Mottled: No Pallor: No Pallor: No Pallor: No Rubor: No Rubor: No Rubor: No Temperature: N/A No Abnormality N/A Tenderness on Palpation: No No Yes Wound Preparation: Ulcer Cleansing: Ulcer Cleansing: Ulcer Cleansing: Rinsed/Irrigated with Saline Rinsed/Irrigated with Saline Rinsed/Irrigated with Saline Topical Anesthetic  Applied: Topical Anesthetic Applied: Topical Anesthetic Applied: Other: lidocaine 4% Other: lidocaine 4% Other: lidocaine 4% Wound Number: 4 5 N/A Photos: No Photos No Photos N/A Wound Location: Left Gluteus Sacrum - Midline N/A Wounding Event: Pressure Injury Pressure Injury N/A Primary Etiology: Pressure Ulcer Pressure Ulcer N/A Comorbid History: Asthma, Hypertension, Type II Asthma, Hypertension, Type II N/A Diabetes, Osteoarthritis, Diabetes, Osteoarthritis, Neuropathy Neuropathy Date Acquired: 01/24/2018 01/24/2018 N/A Weeks of Treatment: 0 0 N/A Wound Status: Open Open N/A Measurements L x W x D 2.6x2.5x0.1 0.7x2.1x0.4 N/A (cm) Area (cm) : 5.105 1.155 N/A Volume (cm) : 0.511 0.462 N/A Undermining: No No N/A Classification: Category/Stage II Category/Stage II N/A Exudate Amount: Medium Medium N/A Exudate Type: Serous Serous N/A Exudate Color: amber amber N/A Wound Margin: Flat and Intact Flat and Intact N/A Granulation Amount: Large (67-100%) Large (67-100%) N/A Necrotic Amount: Small (1-33%) Small (1-33%) N/A Necrotic Tissue: Adherent Slough Adherent Slough N/A Exposed Structures: Fat Layer (Subcutaneous Fat Layer (Subcutaneous N/A Tissue) Exposed: Yes Tissue) Exposed: Yes Fascia: No Fascia: No Tendon: No Tendon: No Muscle: No Muscle: No Joint: No Joint: No Bone: No Bone: No Epithelialization: None None N/A Periwound Skin Texture: Excoriation: No Excoriation: No N/A Induration: No Induration: No Heather Petty, Heather Petty ([id number]) Callus: No Callus: No Crepitus: No Crepitus: No Rash: No Rash: No Scarring: No Scarring: No Periwound Skin Moisture: Maceration: No Maceration: No N/A Dry/Scaly: No Dry/Scaly: No Periwound Skin Color: Atrophie Blanche: No Atrophie Blanche: No N/A Cyanosis: No Cyanosis: No Ecchymosis: No Ecchymosis: No Erythema: No Erythema: No Hemosiderin Staining: No Hemosiderin Staining: No Mottled: No Mottled: No Pallor:  No Pallor: No Rubor: No Rubor: No Temperature: N/A N/A N/A Tenderness on Palpation: No Yes N/A Wound Preparation: Ulcer Cleansing: Ulcer Cleansing: N/A Rinsed/Irrigated with Saline Rinsed/Irrigated with Saline Topical Anesthetic Applied: Topical Anesthetic Applied: Other: lidocaine 4% Other: lidocaine 4% Treatment Notes Electronic Signature(s) Signed: 03/27/2018 6:42:35 PM By: Gretta Cool, BSN, RN, CWS, Kim RN, BSN Entered By: Gretta Cool, BSN, RN, CWS, Kim on 03/27/2018 14:00:31 Heather Petty ([id number]) -------------------------------------------------------------------------------- Multi-Disciplinary Care Plan Details Patient Name: Heather Petty Date of Service: 03/27/2018 1:00 PM Medical Record Number: [id number] Patient Account Number: [id number] Date of Birth/Sex: 1950-12-13 (67 y.o. F) Treating RN: Cornell Barman Primary Care Clevester Helzer: [name] Other Clinician: Referring Lennis Rader: Brain Hilts Treating Abbie Berling/Extender: Tito Dine in Treatment: 0 Active Inactive Electronic Signature(s) Signed: 04/19/2018 8:20:02 AM By: Gretta Cool, BSN, RN, CWS, Kim RN, BSN Previous Signature: 03/27/2018 6:19:53 PM Version By: Gretta Cool, BSN, RN, CWS, Kim RN, BSN Entered By: Gretta Cool, BSN, RN, CWS, Kim on 04/19/2018 08:20:01 Heather Petty ([id number]) -------------------------------------------------------------------------------- Pain Assessment Details Patient Name: Heather Petty Date of Service: 03/27/2018 1:00 PM Medical Record Number: [id number] Patient Account Number: [id number] Date of Birth/Sex: [birth year] (67 y.o. F) Treating RN: Cornell Barman Primary Care Shameria Trimarco: [name] Other Clinician: Referring Nayan Proch: Brain Hilts Treating Salah Burlison/Extender: Tito Dine in Treatment: 0 Active Problems Location of Pain Severity and Description of Pain Patient Has Paino Yes Site Locations Rate the pain. Current Pain Level: 10 Pain Management and  Medication Current Pain Management: Electronic Signature(s) Signed: 03/27/2018 2:49:55 PM By: Juleen China,  RCP,RRT,CHT, Sallie RCP, RRT, CHT Signed: 03/27/2018 6:42:35 PM By: Gretta Cool, BSN, RN, CWS, Kim RN, BSN Entered By: Lorine Bears on 03/27/2018 13:01:41 Heather Petty ([id number]) -------------------------------------------------------------------------------- Patient/Caregiver Education Details Patient Name: Heather Petty Date of Service: 03/27/2018 1:00 PM Medical Record Number: [id number] Patient Account Number: [id number] Date of Birth/Gender: 1950-06-18 (67 y.o. F) Treating RN: Cornell Barman Primary Care Physician: [name] Other Clinician: Referring Physician: Brain Hilts Treating Physician/Extender: Tito Dine in Treatment: 0 Education Assessment Education Provided To: Patient Education Topics Provided Elevated Blood Sugar/ Impact on Healing: Handouts: Elevated Blood Sugars: How Do They Affect Wound Healing Methods: Explain/Verbal Responses: State content correctly Pressure: Handouts: Pressure Ulcers: Care and Offloading Methods: Explain/Verbal Responses: State content correctly Welcome To [facility] [place]: Handouts: Welcome To [facility] [place] Methods: Explain/Verbal Responses: State content correctly Wound Debridement: Handouts: Wound Debridement Methods: Explain/Verbal Responses: State content correctly Electronic Signature(s) Signed: 04/02/2018 11:13:25 AM By: Harold Barban Previous Signature: 03/27/2018 6:42:35 PM Version By: Gretta Cool, BSN, RN, CWS, Kim RN, BSN Entered By: Harold Barban on 03/28/2018 08:38:58 Heather Petty ([id number]) -------------------------------------------------------------------------------- Wound Assessment Details Patient Name: Heather Petty Date of Service: 03/27/2018 1:00 PM Medical Record Number: [id number] Patient Account Number: [id number] Date of Birth/Sex: 1950/09/12 (67  y.o. F) Treating RN: Montey Hora Primary Care Liridona Mashaw: [name] Other Clinician: Referring Arsenia Goracke: Brain Hilts Treating Alva Broxson/Extender: Tito Dine in Treatment: 0 Wound Status Wound Number: 1 Primary Trauma, Other Etiology: Wound Location: Left Axilla Wound Open Wounding Event: Trauma Status: Date Acquired: 01/24/2018 Comorbid Asthma, Hypertension, Type II Diabetes, Weeks Of Treatment: 0 History: Osteoarthritis, Neuropathy Clustered Wound: No Photos Photo Uploaded By: Army Melia on 03/29/2018 15:55:56 Wound Measurements Length: (cm) 1.5 Width: (cm) 4.1 Depth: (cm) 0.1 Area: (cm) 4.83 Volume: (cm) 0.483 % Reduction in Area: % Reduction in Volume: Epithelialization: None Tunneling: No Undermining: No Wound Description Full Thickness Without Exposed Support Classification: Structures Wound Margin: Flat and Intact Exudate None Present Amount: Foul Odor After Cleansing: No Slough/Fibrino No Wound Bed Granulation Amount: None Present (0%) Exposed Structure Necrotic Amount: Large (67-100%) Fascia Exposed: No Necrotic Quality: Eschar Fat Layer (Subcutaneous Tissue) Exposed: No Tendon Exposed: No Muscle Exposed: No Joint Exposed: No Bone Exposed: No Periwound Skin Texture Texture Color Heather Petty, Heather Petty ([id number]) No Abnormalities Noted: No No Abnormalities Noted: No Callus: No Atrophie Blanche: No Crepitus: No Cyanosis: No Excoriation: No Ecchymosis: No Induration: No Erythema: No Rash: No Hemosiderin Staining: No Scarring: No Mottled: No Pallor: No Moisture Rubor: No No Abnormalities Noted: No Dry / Scaly: No Maceration: No Wound Preparation Ulcer Cleansing: Rinsed/Irrigated with Saline Topical Anesthetic Applied: Other: lidocaine 4%, Electronic Signature(s) Signed: 03/27/2018 5:36:16 PM By: Montey Hora Entered By: Montey Hora on 03/27/2018 13:29:24 Heather Petty  ([id number]) -------------------------------------------------------------------------------- Wound Assessment Details Patient Name: Heather Petty Date of Service: 03/27/2018 1:00 PM Medical Record Number: [id number] Patient Account Number: [id number] Date of Birth/Sex: Jul 09, 1950 (68 y.o. F) Treating RN: Montey Hora Primary Care Becky Berberian: [name] Other Clinician: Referring Malayah Demuro: Brain Hilts Treating Mikal Wisman/Extender: Tito Dine in Treatment: 0 Wound Status Wound Number: 2 Primary Pressure Ulcer Etiology: Wound Location: Left Gluteus - Proximal Wound Open Wounding Event: Pressure Injury Status: Date Acquired: 01/24/2018 Comorbid Asthma, Hypertension, Type II Diabetes, Weeks Of Treatment: 0 History: Osteoarthritis, Neuropathy Clustered Wound: No Photos Photo Uploaded By: Army Melia on 03/29/2018 15:58:30 Wound Measurements Length: (cm) 4 % Reduct Width: (cm) 13 % Reduct Depth: (cm) 2.6 Epitheli Area: (cm) 40.841 Tunneli Volume: (cm) 106.186 Undermi Start  Endin Maxim ion in Area: 0% ion in Volume: 0% alization: None ng: No ning: Yes ing Position (o'clock): 12 g Position (o'clock): 6 um Distance: (cm) 2 Wound Description Classification: Category/Stage IV Wound Margin: Well defined, not attached Exudate Amount: Large Exudate Type: Serosanguineous Exudate Color: red, brown Foul Odor After Cleansing: No Slough/Fibrino Yes Wound Bed Granulation Amount: None Present (0%) Exposed Structure Necrotic Amount: Large (67-100%) Fascia Exposed: No Necrotic Quality: Adherent Slough Fat Layer (Subcutaneous Tissue) Exposed: Yes Tendon Exposed: No Muscle Exposed: Yes Heather Petty, Heather Petty ([id number]) Necrosis of Muscle: Yes Joint Exposed: No Bone Exposed: No Periwound Skin Texture Texture Color No Abnormalities Noted: No No Abnormalities Noted: No Callus: No Atrophie Blanche: No Crepitus: No Cyanosis: No Excoriation:  No Ecchymosis: No Induration: No Erythema: No Rash: No Hemosiderin Staining: No Scarring: Yes Mottled: No Pallor: No Moisture Rubor: No No Abnormalities Noted: No Dry / Scaly: No Temperature / Pain Maceration: No Temperature: No Abnormality Wound Preparation Ulcer Cleansing: Rinsed/Irrigated with Saline Topical Anesthetic Applied: Other: lidocaine 4%, Electronic Signature(s) Signed: 03/27/2018 5:36:16 PM By: Montey Hora Signed: 03/27/2018 6:42:35 PM By: Gretta Cool, BSN, RN, CWS, Kim RN, BSN Entered By: Gretta Cool, BSN, RN, CWS, Kim on 03/27/2018 14:17:09 Heather Petty ([id number]) -------------------------------------------------------------------------------- Wound Assessment Details Patient Name: Heather Petty Date of Service: 03/27/2018 1:00 PM Medical Record Number: [id number] Patient Account Number: [id number] Date of Birth/Sex: [birth year] (67 y.o. F) Treating RN: Montey Hora Primary Care Blanca Carreon: [name] Other Clinician: Referring Jaleigh Mccroskey: Brain Hilts Treating Lemuel Boodram/Extender: Tito Dine in Treatment: 0 Wound Status Wound Number: 3 Primary Pressure Ulcer Etiology: Wound Location: Right Gluteus Wound Open Wounding Event: Pressure Injury Status: Date Acquired: 01/24/2018 Comorbid Asthma, Hypertension, Type II Diabetes, Weeks Of Treatment: 0 History: Osteoarthritis, Neuropathy Clustered Wound: No Photos Photo Uploaded By: Army Melia on 03/29/2018 15:56:42 Wound Measurements Length: (cm) 4 Width: (cm) 3.5 Depth: (cm) 0.1 Area: (cm) 10.996 Volume: (cm) 1.1 % Reduction in Area: % Reduction in Volume: Epithelialization: None Tunneling: No Undermining: No Wound Description Classification: Category/Stage II Wound Margin: Flat and Intact Exudate Amount: Medium Exudate Type: Serous Exudate Color: amber Foul Odor After Cleansing: No Slough/Fibrino Yes Wound Bed Granulation Amount: Large (67-100%) Exposed  Structure Necrotic Amount: Small (1-33%) Fascia Exposed: No Necrotic Quality: Adherent Slough Fat Layer (Subcutaneous Tissue) Exposed: Yes Tendon Exposed: No Muscle Exposed: No Joint Exposed: No Bone Exposed: No Periwound Skin Texture Heather Petty, Heather Petty ([id number]) Texture Color No Abnormalities Noted: No No Abnormalities Noted: No Callus: No Atrophie Blanche: No Crepitus: No Cyanosis: No Excoriation: No Ecchymosis: No Induration: No Erythema: No Rash: No Hemosiderin Staining: No Scarring: No Mottled: No Pallor: No Moisture Rubor: No No Abnormalities Noted: No Dry / Scaly: No Temperature / Pain Maceration: No Tenderness on Palpation: Yes Wound Preparation Ulcer Cleansing: Rinsed/Irrigated with Saline Topical Anesthetic Applied: Other: lidocaine 4%, Electronic Signature(s) Signed: 03/27/2018 5:36:16 PM By: Montey Hora Entered By: Montey Hora on 03/27/2018 13:33:49 Heather Petty ([id number]) -------------------------------------------------------------------------------- Wound Assessment Details Patient Name: Heather Petty Date of Service: 03/27/2018 1:00 PM Medical Record Number: [id number] Patient Account Number: [id number] Date of Birth/Sex: 02-27-1950 (67 y.o. F) Treating RN: Montey Hora Primary Care Shaylee Stanislawski: [name] Other Clinician: Referring Mariaceleste Herrera: Brain Hilts Treating Kameshia Madruga/Extender: Tito Dine in Treatment: 0 Wound Status Wound Number: 4 Primary Pressure Ulcer Etiology: Wound Location: Left Gluteus Wound Open Wounding Event: Pressure Injury Status: Date Acquired: 01/24/2018 Comorbid Asthma, Hypertension, Type II Diabetes, Weeks Of Treatment: 0 History: Osteoarthritis, Neuropathy Clustered Wound: No Photos Photo Uploaded By: Army Melia on  03/29/2018 15:58:30 Wound Measurements Length: (cm) 2.6 Width: (cm) 2.5 Depth: (cm) 0.1 Area: (cm) 5.105 Volume: (cm) 0.511 % Reduction in Area: %  Reduction in Volume: Epithelialization: None Tunneling: No Undermining: No Wound Description Classification: Category/Stage II Wound Margin: Flat and Intact Exudate Amount: Medium Exudate Type: Serous Exudate Color: amber Foul Odor After Cleansing: No Slough/Fibrino Yes Wound Bed Granulation Amount: Large (67-100%) Exposed Structure Necrotic Amount: Small (1-33%) Fascia Exposed: No Necrotic Quality: Adherent Slough Fat Layer (Subcutaneous Tissue) Exposed: Yes Tendon Exposed: No Muscle Exposed: No Joint Exposed: No Bone Exposed: No Periwound Skin Texture Heather Petty, Heather Petty ([id number]) Texture Color No Abnormalities Noted: No No Abnormalities Noted: No Callus: No Atrophie Blanche: No Crepitus: No Cyanosis: No Excoriation: No Ecchymosis: No Induration: No Erythema: No Rash: No Hemosiderin Staining: No Scarring: No Mottled: No Pallor: No Moisture Rubor: No No Abnormalities Noted: No Dry / Scaly: No Maceration: No Wound Preparation Ulcer Cleansing: Rinsed/Irrigated with Saline Topical Anesthetic Applied: Other: lidocaine 4%, Electronic Signature(s) Signed: 03/27/2018 5:36:16 PM By: Montey Hora Entered By: Montey Hora on 03/27/2018 13:34:59 Heather Petty ([id number]) -------------------------------------------------------------------------------- Wound Assessment Details Patient Name: Heather Petty Date of Service: 03/27/2018 1:00 PM Medical Record Number: [id number] Patient Account Number: [id number] Date of Birth/Sex: [birth year] (67 y.o. F) Treating RN: Montey Hora Primary Care Luisantonio Adinolfi: [name] Other Clinician: Referring Ryett Hamman: Brain Hilts Treating Aldair Rickel/Extender: Tito Dine in Treatment: 0 Wound Status Wound Number: 5 Primary Pressure Ulcer Etiology: Wound Location: Sacrum - Midline Wound Open Wounding Event: Pressure Injury Status: Date Acquired: 01/24/2018 Comorbid Asthma, Hypertension, Type II  Diabetes, Weeks Of Treatment: 0 History: Osteoarthritis, Neuropathy Clustered Wound: No Photos Photo Uploaded By: Army Melia on 03/29/2018 15:58:01 Wound Measurements Length: (cm) 0.7 Width: (cm) 2.1 Depth: (cm) 0.4 Area: (cm) 1.155 Volume: (cm) 0.462 % Reduction in Area: % Reduction in Volume: Epithelialization: None Tunneling: No Undermining: No Wound Description Classification: Category/Stage II Wound Margin: Flat and Intact Exudate Amount: Medium Exudate Type: Serous Exudate Color: amber Foul Odor After Cleansing: No Slough/Fibrino Yes Wound Bed Granulation Amount: Large (67-100%) Exposed Structure Necrotic Amount: Small (1-33%) Fascia Exposed: No Necrotic Quality: Adherent Slough Fat Layer (Subcutaneous Tissue) Exposed: Yes Tendon Exposed: No Muscle Exposed: No Joint Exposed: No Bone Exposed: No Periwound Skin Texture Heather Petty, Heather Petty ([id number]) Texture Color No Abnormalities Noted: No No Abnormalities Noted: No Callus: No Atrophie Blanche: No Crepitus: No Cyanosis: No Excoriation: No Ecchymosis: No Induration: No Erythema: No Rash: No Hemosiderin Staining: No Scarring: No Mottled: No Pallor: No Moisture Rubor: No No Abnormalities Noted: No Dry / Scaly: No Temperature / Pain Maceration: No Tenderness on Palpation: Yes Wound Preparation Ulcer Cleansing: Rinsed/Irrigated with Saline Topical Anesthetic Applied: Other: lidocaine 4%, Electronic Signature(s) Signed: 03/27/2018 5:36:16 PM By: Montey Hora Entered By: Montey Hora on 03/27/2018 13:36:20 Heather Petty ([id number]) -------------------------------------------------------------------------------- Vitals Details Patient Name: Heather Petty Date of Service: 03/27/2018 1:00 PM Medical Record Number: [id number] Patient Account Number: [id number] Date of Birth/Sex: [birth year] (68 y.o. F) Treating RN: Cornell Barman Primary Care Nephtali Docken: [name] Other  Clinician: Referring Travell Desaulniers: Brain Hilts Treating Ryott Rafferty/Extender: Tito Dine in Treatment: 0 Vital Signs Time Taken: 13:01 Temperature (F): 98.2 Height (in): 61 Pulse (bpm): 92 Source: Stated Respiratory Rate (breaths/min): 16 Weight (lbs): 273 Blood Pressure (mmHg): 128/60 Source: Stated Reference Range: 80 - 120 mg / dl Body Mass Index (BMI): 51.6 Electronic Signature(s) Signed: 03/27/2018 2:49:55 PM By: Lorine Bears RCP, RRT, CHT Entered By: Lorine Bears on 03/27/2018 13:02:27

## 2018-03-30 ENCOUNTER — Inpatient Hospital Stay (HOSPITAL_COMMUNITY)
Admission: EM | Admit: 2018-03-30 | Discharge: 2018-04-12 | DRG: 554 | Disposition: A | Discharge status: Skilled Nursing Facility | Payer: Medicare Other | Attending: Internal Medicine | Admitting: Internal Medicine

## 2018-03-30 ENCOUNTER — Emergency Department (HOSPITAL_COMMUNITY): Payer: Medicare Other

## 2018-03-30 ENCOUNTER — Other Ambulatory Visit: Payer: Self-pay

## 2018-03-30 ENCOUNTER — Encounter (HOSPITAL_COMMUNITY): Payer: Self-pay | Admitting: Emergency Medicine

## 2018-03-30 DIAGNOSIS — L89150 Pressure ulcer of sacral region, unstageable: Secondary | ICD-10-CM | POA: Diagnosis present

## 2018-03-30 DIAGNOSIS — M1990 Unspecified osteoarthritis, unspecified site: Secondary | ICD-10-CM | POA: Diagnosis present

## 2018-03-30 DIAGNOSIS — T148XXA Other injury of unspecified body region, initial encounter: Secondary | ICD-10-CM

## 2018-03-30 DIAGNOSIS — E1122 Type 2 diabetes mellitus with diabetic chronic kidney disease: Secondary | ICD-10-CM | POA: Diagnosis present

## 2018-03-30 DIAGNOSIS — E1129 Type 2 diabetes mellitus with other diabetic kidney complication: Secondary | ICD-10-CM | POA: Diagnosis present

## 2018-03-30 DIAGNOSIS — Z7401 Bed confinement status: Secondary | ICD-10-CM

## 2018-03-30 DIAGNOSIS — I10 Essential (primary) hypertension: Secondary | ICD-10-CM | POA: Diagnosis present

## 2018-03-30 DIAGNOSIS — L089 Local infection of the skin and subcutaneous tissue, unspecified: Secondary | ICD-10-CM | POA: Diagnosis present

## 2018-03-30 DIAGNOSIS — R748 Abnormal levels of other serum enzymes: Secondary | ICD-10-CM | POA: Diagnosis present

## 2018-03-30 DIAGNOSIS — E785 Hyperlipidemia, unspecified: Secondary | ICD-10-CM | POA: Diagnosis present

## 2018-03-30 DIAGNOSIS — E1169 Type 2 diabetes mellitus with other specified complication: Secondary | ICD-10-CM | POA: Diagnosis present

## 2018-03-30 DIAGNOSIS — E876 Hypokalemia: Secondary | ICD-10-CM | POA: Diagnosis present

## 2018-03-30 DIAGNOSIS — R531 Weakness: Secondary | ICD-10-CM

## 2018-03-30 DIAGNOSIS — E1142 Type 2 diabetes mellitus with diabetic polyneuropathy: Secondary | ICD-10-CM | POA: Diagnosis present

## 2018-03-30 DIAGNOSIS — Z7951 Long term (current) use of inhaled steroids: Secondary | ICD-10-CM

## 2018-03-30 DIAGNOSIS — Z87891 Personal history of nicotine dependence: Secondary | ICD-10-CM

## 2018-03-30 DIAGNOSIS — E7800 Pure hypercholesterolemia, unspecified: Secondary | ICD-10-CM | POA: Diagnosis present

## 2018-03-30 DIAGNOSIS — N183 Chronic kidney disease, stage 3 (moderate): Secondary | ICD-10-CM | POA: Diagnosis present

## 2018-03-30 DIAGNOSIS — K219 Gastro-esophageal reflux disease without esophagitis: Secondary | ICD-10-CM | POA: Diagnosis present

## 2018-03-30 DIAGNOSIS — Z23 Encounter for immunization: Secondary | ICD-10-CM

## 2018-03-30 DIAGNOSIS — Z6841 Body Mass Index (BMI) 40.0 and over, adult: Secondary | ICD-10-CM

## 2018-03-30 DIAGNOSIS — I129 Hypertensive chronic kidney disease with stage 1 through stage 4 chronic kidney disease, or unspecified chronic kidney disease: Secondary | ICD-10-CM | POA: Diagnosis present

## 2018-03-30 DIAGNOSIS — J45909 Unspecified asthma, uncomplicated: Secondary | ICD-10-CM | POA: Diagnosis present

## 2018-03-30 DIAGNOSIS — S31000A Unspecified open wound of lower back and pelvis without penetration into retroperitoneum, initial encounter: Secondary | ICD-10-CM | POA: Diagnosis present

## 2018-03-30 DIAGNOSIS — E11649 Type 2 diabetes mellitus with hypoglycemia without coma: Secondary | ICD-10-CM | POA: Diagnosis not present

## 2018-03-30 DIAGNOSIS — Z794 Long term (current) use of insulin: Secondary | ICD-10-CM

## 2018-03-30 DIAGNOSIS — F200 Paranoid schizophrenia: Secondary | ICD-10-CM | POA: Diagnosis present

## 2018-03-30 DIAGNOSIS — M109 Gout, unspecified: Principal | ICD-10-CM | POA: Diagnosis present

## 2018-03-30 LAB — CBG MONITORING, ED: Glucose-Capillary: 324 mg/dL — ABNORMAL HIGH (ref 70–99)

## 2018-03-30 LAB — POCT I-STAT EG7
Acid-Base Excess: 3 mmol/L — ABNORMAL HIGH (ref 0.0–2.0)
Bicarbonate: 25 mmol/L (ref 20.0–28.0)
Calcium, Ion: 0.97 mmol/L — ABNORMAL LOW (ref 1.15–1.40)
HCT: 33 % — ABNORMAL LOW (ref 36.0–46.0)
Hemoglobin: 11.2 g/dL — ABNORMAL LOW (ref 12.0–15.0)
O2 Saturation: 99 %
POTASSIUM: 3.3 mmol/L — AB (ref 3.5–5.1)
Sodium: 137 mmol/L (ref 135–145)
TCO2: 26 mmol/L (ref 22–32)
pCO2, Ven: 30.5 mmHg — ABNORMAL LOW (ref 44.0–60.0)
pH, Ven: 7.522 — ABNORMAL HIGH (ref 7.250–7.430)
pO2, Ven: 106 mmHg — ABNORMAL HIGH (ref 32.0–45.0)

## 2018-03-30 LAB — CBC WITH DIFFERENTIAL/PLATELET
Abs Immature Granulocytes: 0.05 10*3/uL (ref 0.00–0.07)
Basophils Absolute: 0 10*3/uL (ref 0.0–0.1)
Basophils Relative: 0 %
Eosinophils Absolute: 0.3 10*3/uL (ref 0.0–0.5)
Eosinophils Relative: 4 %
HCT: 36.3 % (ref 36.0–46.0)
Hemoglobin: 10.8 g/dL — ABNORMAL LOW (ref 12.0–15.0)
Immature Granulocytes: 1 %
Lymphocytes Relative: 9 %
Lymphs Abs: 0.8 10*3/uL (ref 0.7–4.0)
MCH: 30.3 pg (ref 26.0–34.0)
MCHC: 29.8 g/dL — ABNORMAL LOW (ref 30.0–36.0)
MCV: 102 fL — AB (ref 80.0–100.0)
MONOS PCT: 9 %
Monocytes Absolute: 0.7 10*3/uL (ref 0.1–1.0)
Neutro Abs: 6.8 10*3/uL (ref 1.7–7.7)
Neutrophils Relative %: 77 %
Platelets: 140 10*3/uL — ABNORMAL LOW (ref 150–400)
RBC: 3.56 MIL/uL — ABNORMAL LOW (ref 3.87–5.11)
RDW: 16.4 % — ABNORMAL HIGH (ref 11.5–15.5)
WBC: 8.7 10*3/uL (ref 4.0–10.5)
nRBC: 0 % (ref 0.0–0.2)

## 2018-03-30 LAB — URINALYSIS, ROUTINE W REFLEX MICROSCOPIC
Bilirubin Urine: NEGATIVE
Glucose, UA: >=500 mg/dL — AB
Hgb urine dipstick: NEGATIVE
Ketones, ur: 20 mg/dL — AB
Leukocytes,Ua: NEGATIVE
NITRITE: NEGATIVE
PH: 7 (ref 5.0–8.0)
Protein, ur: NEGATIVE mg/dL
Specific Gravity, Urine: 1.016 (ref 1.005–1.030)

## 2018-03-30 LAB — BASIC METABOLIC PANEL
ANION GAP: 15 (ref 5–15)
BUN: 16 mg/dL (ref 8–23)
CO2: 21 mmol/L — ABNORMAL LOW (ref 22–32)
Calcium: 8.5 mg/dL — ABNORMAL LOW (ref 8.9–10.3)
Chloride: 103 mmol/L (ref 98–111)
Creatinine, Ser: 1.07 mg/dL — ABNORMAL HIGH (ref 0.44–1.00)
GFR calc Af Amer: >60 mL/min (ref >60)
GFR calc non Af Amer: 53 mL/min — ABNORMAL LOW (ref >60)
Glucose, Bld: 342 mg/dL — ABNORMAL HIGH (ref 70–99)
POTASSIUM: 3.2 mmol/L — AB (ref 3.5–5.1)
Sodium: 139 mmol/L (ref 135–145)

## 2018-03-30 LAB — AEROBIC CULTURE W GRAM STAIN (SUPERFICIAL SPECIMEN)

## 2018-03-30 LAB — AEROBIC CULTURE  (SUPERFICIAL SPECIMEN)

## 2018-03-30 LAB — I-STAT TROPONIN, ED: Troponin i, poc: 0 ng/mL (ref 0.00–0.08)

## 2018-03-30 MED ORDER — LORATADINE 10 MG PO TABS
10.0000 mg | ORAL_TABLET | Freq: Every day | ORAL | Status: DC
  Administered 2018-03-31 – 2018-04-12 (×13): 10 mg via ORAL
  Filled 2018-03-30 (×13): qty 1

## 2018-03-30 MED ORDER — ALLOPURINOL 300 MG PO TABS
300.0000 mg | ORAL_TABLET | Freq: Every day | ORAL | Status: DC
  Administered 2018-04-01 – 2018-04-12 (×12): 300 mg via ORAL
  Filled 2018-03-30 (×13): qty 1

## 2018-03-30 MED ORDER — FAMOTIDINE 20 MG PO TABS
20.0000 mg | ORAL_TABLET | Freq: Every day | ORAL | Status: DC
  Administered 2018-03-31 – 2018-04-11 (×13): 20 mg via ORAL
  Filled 2018-03-30 (×13): qty 1

## 2018-03-30 MED ORDER — HYDROCERIN EX CREA
1.0000 "application " | TOPICAL_CREAM | Freq: Two times a day (BID) | CUTANEOUS | Status: DC
  Administered 2018-04-01 – 2018-04-12 (×19): 1 via TOPICAL
  Filled 2018-03-30 (×3): qty 113

## 2018-03-30 MED ORDER — NYSTATIN 100000 UNIT/GM EX POWD
Freq: Two times a day (BID) | CUTANEOUS | Status: DC
  Administered 2018-04-01: 1 g via TOPICAL
  Administered 2018-04-01 – 2018-04-05 (×7): via TOPICAL
  Administered 2018-04-05: 2 g via TOPICAL
  Administered 2018-04-06 – 2018-04-12 (×12): via TOPICAL
  Filled 2018-03-30: qty 15

## 2018-03-30 MED ORDER — FOLIC ACID 1 MG PO TABS
1.0000 mg | ORAL_TABLET | Freq: Every day | ORAL | Status: DC
  Administered 2018-03-31 – 2018-04-12 (×13): 1 mg via ORAL
  Filled 2018-03-30 (×13): qty 1

## 2018-03-30 MED ORDER — INSULIN GLARGINE 100 UNIT/ML ~~LOC~~ SOLN
20.0000 [IU] | Freq: Every day | SUBCUTANEOUS | Status: DC
  Administered 2018-03-30 – 2018-04-05 (×7): 20 [IU] via SUBCUTANEOUS
  Filled 2018-03-30 (×8): qty 0.2

## 2018-03-30 MED ORDER — FUROSEMIDE 20 MG PO TABS
20.0000 mg | ORAL_TABLET | Freq: Every day | ORAL | Status: DC
  Administered 2018-03-31 – 2018-04-12 (×13): 20 mg via ORAL
  Filled 2018-03-30 (×13): qty 1

## 2018-03-30 MED ORDER — POLYETHYLENE GLYCOL 3350 17 G PO PACK
17.0000 g | PACK | Freq: Every day | ORAL | Status: DC | PRN
  Administered 2018-04-01: 17 g via ORAL
  Filled 2018-03-30: qty 1

## 2018-03-30 MED ORDER — BIOTENE DRY MOUTH MT LIQD
15.0000 mL | Freq: Three times a day (TID) | OROMUCOSAL | Status: DC
  Filled 2018-03-30 (×2): qty 15

## 2018-03-30 MED ORDER — INSULIN ASPART 100 UNIT/ML ~~LOC~~ SOLN: 0.0000 [IU] | SUBCUTANEOUS | Status: DC

## 2018-03-30 MED ORDER — POTASSIUM CHLORIDE CRYS ER 20 MEQ PO TBCR
40.0000 meq | EXTENDED_RELEASE_TABLET | Freq: Once | ORAL | Status: AC
  Administered 2018-03-30: 40 meq via ORAL
  Filled 2018-03-30: qty 2

## 2018-03-30 MED ORDER — BISACODYL 5 MG PO TBEC
5.0000 mg | DELAYED_RELEASE_TABLET | Freq: Every day | ORAL | Status: DC | PRN
  Administered 2018-04-08: 5 mg via ORAL
  Filled 2018-03-30: qty 1

## 2018-03-30 MED ORDER — AZELASTINE-FLUTICASONE 137-50 MCG/ACT NA SUSP: 1.0000 | Freq: Two times a day (BID) | NASAL | Status: DC | PRN

## 2018-03-30 MED ORDER — AMLODIPINE BESYLATE 10 MG PO TABS
10.0000 mg | ORAL_TABLET | Freq: Every day | ORAL | Status: DC
  Administered 2018-03-31 – 2018-04-12 (×13): 10 mg via ORAL
  Filled 2018-03-30: qty 2
  Filled 2018-03-30 (×3): qty 1
  Filled 2018-03-30 (×2): qty 2
  Filled 2018-03-30 (×4): qty 1
  Filled 2018-03-30: qty 2
  Filled 2018-03-30 (×2): qty 1

## 2018-03-30 MED ORDER — PRO-STAT SUGAR FREE PO LIQD
30.0000 mL | Freq: Three times a day (TID) | ORAL | Status: DC
  Administered 2018-03-31 – 2018-04-12 (×33): 30 mL via ORAL
  Filled 2018-03-30 (×41): qty 30

## 2018-03-30 MED ORDER — GABAPENTIN 300 MG PO CAPS
300.0000 mg | ORAL_CAPSULE | Freq: Three times a day (TID) | ORAL | Status: DC
  Administered 2018-03-31 – 2018-04-12 (×39): 300 mg via ORAL
  Filled 2018-03-30 (×39): qty 1

## 2018-03-30 MED ORDER — DOXEPIN HCL 25 MG PO CAPS
25.0000 mg | ORAL_CAPSULE | Freq: Two times a day (BID) | ORAL | Status: DC
  Administered 2018-03-30 – 2018-04-12 (×25): 25 mg via ORAL
  Filled 2018-03-30 (×27): qty 1

## 2018-03-30 MED ORDER — PRAVASTATIN SODIUM 40 MG PO TABS
40.0000 mg | ORAL_TABLET | Freq: Every day | ORAL | Status: DC
  Administered 2018-03-31 – 2018-04-12 (×13): 40 mg via ORAL
  Filled 2018-03-30 (×13): qty 1

## 2018-03-30 MED ORDER — AZELASTINE HCL 0.1 % NA SOLN: 1.0000 | Freq: Two times a day (BID) | NASAL | Status: DC | PRN

## 2018-03-30 MED ORDER — POTASSIUM CHLORIDE CRYS ER 20 MEQ PO TBCR
20.0000 meq | EXTENDED_RELEASE_TABLET | Freq: Every day | ORAL | Status: AC
  Administered 2018-03-31 – 2018-04-01 (×2): 20 meq via ORAL
  Filled 2018-03-30 (×2): qty 1

## 2018-03-30 MED ORDER — ALBUTEROL SULFATE HFA 108 (90 BASE) MCG/ACT IN AERS
2.0000 | INHALATION_SPRAY | RESPIRATORY_TRACT | Status: DC | PRN
  Filled 2018-03-30: qty 6.7

## 2018-03-30 MED ORDER — B COMPLEX-C PO TABS
1.0000 | ORAL_TABLET | Freq: Every day | ORAL | Status: DC
  Administered 2018-04-02 – 2018-04-12 (×11): 1 via ORAL
  Filled 2018-03-30 (×14): qty 1

## 2018-03-30 MED ORDER — COLLAGENASE 250 UNIT/GM EX OINT: TOPICAL_OINTMENT | Freq: Every day | CUTANEOUS | Status: DC

## 2018-03-30 MED ORDER — ACETAMINOPHEN 325 MG PO TABS
650.0000 mg | ORAL_TABLET | Freq: Four times a day (QID) | ORAL | Status: DC | PRN
  Administered 2018-03-31 – 2018-04-01 (×3): 650 mg via ORAL
  Filled 2018-03-30 (×3): qty 2

## 2018-03-30 MED ORDER — HYDROXYZINE HCL 25 MG PO TABS
25.0000 mg | ORAL_TABLET | Freq: Three times a day (TID) | ORAL | Status: DC
  Administered 2018-03-31 – 2018-04-12 (×39): 25 mg via ORAL
  Filled 2018-03-30 (×39): qty 1

## 2018-03-30 MED ORDER — LACTATED RINGERS IV BOLUS
1000.0000 mL | Freq: Once | INTRAVENOUS | Status: AC
  Administered 2018-03-30: 1000 mL via INTRAVENOUS

## 2018-03-30 MED ORDER — FLUTICASONE PROPIONATE 50 MCG/ACT NA SUSP: 1.0000 | Freq: Two times a day (BID) | NASAL | Status: DC | PRN

## 2018-03-30 NOTE — ED Triage Notes (Signed)
Per [facility]- Pt picked up from home due to fall today and weakness with fatigue x29month. Children also have concerns of bed sores on buttocks.  EMS also reports that Pt does not want to go into a nursing facility

## 2018-03-30 NOTE — ED Provider Notes (Signed)
[facility] EMERGENCY DEPARTMENT Provider Note   CSN: [id number] Arrival date & time: 03/30/18  1748    History   Chief Complaint Chief Complaint  Patient presents with  . Fall  . Weakness  . Fatigue    HPI Heather Petty is a 68 y.o. female.     HPI  68 year old female presents with a fall and generalized weakness.  She was going from the bathroom back to go to the couch when her legs gave out from under her.  She has been having generalized weakness, especially in the legs for about a month.  She was recently admitted to the hospital and then discharged to [facility] and got out of there about 2 days ago.  The patient is in a wheelchair typically.  The patient has been eating less over the last couple days and has not been able to restart her home medicines since being discharged from the facility.  EMS reports a glucose over 400.  She reports no fevers, vomiting, chest pain, shortness of breath, or infectious symptoms.  She feels like her legs are weaker than typical.  No new back pain.  She has chronic wounds to her buttocks that have been improving.  She has developed a headache since the fall though she does not think she hit her head or loss consciousness.  Past Medical History:  Diagnosis Date  . AKI (acute kidney injury) ([place]) 12/17/2017  . Arthritis   . Asthma   . Depression   . Diabetes mellitus   . Gout   . High cholesterol   . Hypertension   . Morbid obesity ([place])   . Pneumonia 12/17/2017  . Schizophrenia [facility])     Patient Active Problem List   Diagnosis Date Noted  . Asthma exacerbation 03/14/2018  . Hypotension 03/14/2018  . Lactic acidosis 03/14/2018  . GERD (gastroesophageal reflux disease) 03/14/2018  . Acute renal failure superimposed on stage 3 chronic kidney disease ([place]) 03/14/2018  . Macrocytic anemia 03/14/2018  . Abnormal LFTs 03/14/2018  . Acute kidney injury ([place]) 02/28/2018  . Wound of sacral region 02/28/2018  .  Hypothermia 02/28/2018  . Pressure injury of skin 02/06/2018  . Acute renal failure (ARF) ([place]) 02/05/2018  . Hyperlipidemia associated with type 2 diabetes mellitus ([place]) 02/05/2018  . Hyperkalemia 02/05/2018  . Dermatitis 02/05/2018  . Open back wound 02/05/2018  . Dysuria 02/05/2018  . Paranoid schizophrenia ([place])   . CAP (community acquired pneumonia) 12/17/2017  . AKI (acute kidney injury) ([place]) 12/17/2017  . Type II diabetes mellitus with renal manifestations ([place]) 12/17/2017  . HTN (hypertension) 12/17/2017  . Gout     Past Surgical History:  Procedure Laterality Date  . CYST EXCISION    . DENTAL SURGERY    . TONSILLECTOMY       OB History   No obstetric history on file.      Home Medications    Prior to Admission medications   Medication Sig Start Date End Date Taking? Authorizing Provider  acetaminophen (TYLENOL) 325 MG tablet Take 650 mg by mouth 4 (four) times daily as needed for headache (pain).   Yes [provider]  albuterol (VENTOLIN HFA) 108 (90 Base) MCG/ACT inhaler Inhale 2 puffs into the lungs every 4 (four) hours as needed for wheezing or shortness of breath.   Yes [provider]  allopurinol (ZYLOPRIM) 300 MG tablet Take 300 mg by mouth daily.   Yes [provider]  Amino Acids-Protein Hydrolys (FEEDING  SUPPLEMENT, PRO-STAT SUGAR FREE 64,) LIQD Take 30 mLs by mouth 3 (three) times daily with meals.   Yes [provider]  amLODipine (NORVASC) 10 MG tablet Take 10 mg by mouth daily.   Yes [provider]  antiseptic oral rinse (BIOTENE) LIQD 15 mLs by Mouth Rinse route 3 (three) times daily. Swish and spit   Yes [provider]  Azelastine-Fluticasone (DYMISTA) 137-50 MCG/ACT SUSP Place 1 spray into both nostrils 2 (two) times daily as needed (for allergies).    Yes [provider]  b complex vitamins tablet Take 1 tablet by mouth daily.   Yes [provider]  bisacodyl (DULCOLAX) 5  MG EC tablet Take 1 tablet (5 mg total) by mouth daily as needed for moderate constipation. Patient taking differently: Take 5 mg by mouth daily as needed for mild constipation.  12/24/17  Yes Lady Deutscher, MD  cetirizine (ZYRTEC) 10 MG tablet Take 10 mg by mouth daily.    Yes [provider]  collagenase (SANTYL) ointment Apply topically daily. Patient taking differently: Apply 1 application topically See admin instructions. Clean with Dakin's Solution (half strength), apply skin prep periwound. Apply Santyl Ointment to necrotic tissue in wound bed, cover with Calcium Alginate, cover with bordered foam dressing daily. 02/09/18  Yes Elgergawy, Silver Huguenin, MD  Colloidal Oatmeal (EUCERIN ECZEMA RELIEF) 1 % CREA Apply 1 application topically 2 (two) times daily.   Yes [provider]  cyanocobalamin (,VITAMIN B-12,) 1000 MCG/ML injection Inject 1 mL (1,000 mcg total) into the muscle once a week. Patient taking differently: Inject 1,000 mcg into the muscle every Monday.  02/09/18  Yes Elgergawy, Silver Huguenin, MD  doxepin (SINEQUAN) 25 MG capsule Take 25 mg by mouth 2 (two) times daily.    Yes [provider]  famotidine (PEPCID) 20 MG tablet Take 20 mg by mouth at bedtime.    Yes [provider]  folic acid (FOLVITE) 1 MG tablet Take 1 mg by mouth daily.   Yes [provider]  furosemide (LASIX) 20 MG tablet Take 20 mg by mouth daily.   Yes [provider]  gabapentin (NEURONTIN) 300 MG capsule Take 300 mg by mouth 3 (three) times daily.   Yes [provider]  hydrocortisone 2.5 % cream Apply 1 application topically See admin instructions. Apply topically to rash on face twice daily   Yes [provider]  hydrOXYzine (ATARAX/VISTARIL) 25 MG tablet Take 25 mg by mouth 3 (three) times daily.   Yes [provider]  insulin aspart (NOVOLOG) 100 UNIT/ML injection Take subcu QA CHS, 140-199 - 2 units, 200-250 - 6 units, 251-299 - 8  units,  300-349 - 10 units,  350 or above 14 units. Patient taking differently: Inject 0-12 Units into the skin See admin instructions. Inject 0-12 units into the skin 4 times a day (before meals and at bedtime) per sliding scale: BGL <60 or >400  = CALL MD; 60-200 = 0 units; 201-250 = 2 units; 251-300 = 4 units; 301-350 = 6 units; 351-400 = 8 units; 401-450 = 10 units; >450 = 12 units 03/05/18  Yes Thurnell Lose, MD  insulin glargine (LANTUS) 100 UNIT/ML injection Inject 0.2 mLs (20 Units total) into the skin at bedtime. 03/05/18  Yes Thurnell Lose, MD  ipratropium-albuterol (DUONEB) 0.5-2.5 (3) MG/3ML SOLN Take 3 mLs by nebulization 4 (four) times daily.    Yes [provider]  levofloxacin (LEVAQUIN) 750 MG tablet Take 750 mg by  mouth daily.   Yes [provider]  Multiple Vitamins-Minerals (THERA-M MULTIPLE VITAMINS PO) Take 1 tablet by mouth daily.   Yes [provider]  nystatin (MYCOSTATIN/NYSTOP) powder Apply topically See admin instructions. Apply topically twice daily -  between folds, beneath breasts, bilateral axillae, and under pannus   Yes [provider]  polyethylene glycol (MIRALAX / GLYCOLAX) packet Take 17 g by mouth 2 (two) times daily. Patient taking differently: Take 17 g by mouth daily as needed for mild constipation.  12/24/17  Yes Lady Deutscher, MD  pravastatin (PRAVACHOL) 40 MG tablet Take 40 mg by mouth daily.   Yes [provider]  risperiDONE (RISPERDAL) 0.5 MG tablet Take 1 tablet (0.5 mg total) by mouth 2 (two) times daily. 12/23/17 04/28/18 Yes Purohit, Konrad Dolores, MD  silver sulfADIAZINE (SILVADENE) 1 % cream Apply topically 2 (two) times daily. 02/09/18  Yes Elgergawy, Silver Huguenin, MD  traMADol (ULTRAM) 50 MG tablet Take 50 mg by mouth 4 (four) times daily as needed (pain).    Yes [provider]  triamcinolone (KENALOG) 0.147 MG/GM topical spray Apply 1 spray topically See admin instructions. Apply topically to  head in the direction of hair growth daily at bedtime   Yes [provider]  vitamin B-6 (PYRIDOXINE) 25 MG tablet Take 25 mg by mouth daily.   Yes [provider]  vitamin C (ASCORBIC ACID) 500 MG tablet Take 500 mg by mouth 2 (two) times daily.   Yes [provider]  Vitamin D, Ergocalciferol, (DRISDOL) 1.25 MG (50000 UT) CAPS capsule Take 50,000 Units by mouth every Monday.    Yes [provider]  Zinc Sulfate (ZINC-220 PO) Take 220 mg by mouth at bedtime.    Yes [provider]  gabapentin (NEURONTIN) 600 MG tablet Take 0.5 tablets (300 mg total) by mouth 3 (three) times daily. Patient not taking: Reported on 03/30/2018 12/23/17 03/14/18  Cristy Folks, MD    Family History Family History  Family history unknown: Yes    Social History Social History   Tobacco Use  . Smoking status: Former Research scientist (life sciences)  . Smokeless tobacco: Never Used  Substance Use Topics  . Alcohol use: No  . Drug use: No     Allergies   Patient has no known allergies.   Review of Systems Review of Systems  Constitutional: Positive for fatigue. Negative for fever.  Respiratory: Negative for shortness of breath.   Cardiovascular: Negative for chest pain.  Gastrointestinal: Negative for abdominal pain, diarrhea and vomiting.  Neurological: Positive for weakness and headaches.  All other systems reviewed and are negative.    Physical Exam Updated Vital Signs BP (!) 113/45 (BP Location: Right Arm)   Pulse 96   Temp 98.8 F (37.1 C) (Oral)   Resp (!) 25   Ht 5\' 1"  (1.549 m)   Wt 130 kg   SpO2 92%   BMI 54.15 kg/m   Physical Exam Vitals signs and nursing note reviewed.  Constitutional:      Appearance: She is well-developed. She is obese.  HENT:     Head: Normocephalic and atraumatic.     Right Ear: External ear normal.     Left Ear: External ear normal.     Nose: Nose normal.  Eyes:     General:        Right eye: No discharge.        Left eye: No  discharge.     Extraocular Movements: Extraocular movements intact.  Pupils: Pupils are equal, round, and reactive to light.  Cardiovascular:     Rate and Rhythm: Normal rate and regular rhythm.     Heart sounds: Normal heart sounds.  Pulmonary:     Effort: Pulmonary effort is normal.     Breath sounds: Normal breath sounds.  Abdominal:     Palpations: Abdomen is soft.     Tenderness: There is no abdominal tenderness.  Skin:    General: Skin is warm and dry.     Comments: Left buttock wound that is packed. Another perirectal stage 1 decubitus  Neurological:     Mental Status: She is alert and oriented to person, place, and time.     Comments: CN 3-12 grossly intact. 5/5 strength in bilateral upper extremities. 3/5 strength in BLE. Grossly normal sensation.   Psychiatric:        Mood and Affect: Mood is not anxious.      ED Treatments / Results  Labs (all labs ordered are listed, but only abnormal results are displayed) Labs Reviewed  BASIC METABOLIC PANEL - Abnormal; Notable for the following components:      Result Value   Potassium 3.2 (*)    CO2 21 (*)    Glucose, Bld 342 (*)    Creatinine, Ser 1.07 (*)    Calcium 8.5 (*)    GFR calc non Af Amer 53 (*)    All other components within normal limits  CBC WITH DIFFERENTIAL/PLATELET - Abnormal; Notable for the following components:   RBC 3.56 (*)    Hemoglobin 10.8 (*)    MCV 102.0 (*)    MCHC 29.8 (*)    RDW 16.4 (*)    Platelets 140 (*)    All other components within normal limits  URINALYSIS, ROUTINE W REFLEX MICROSCOPIC - Abnormal; Notable for the following components:   Color, Urine STRAW (*)    Glucose, UA >=500 (*)    Ketones, ur 20 (*)    Bacteria, UA RARE (*)    All other components within normal limits  CBG MONITORING, ED - Abnormal; Notable for the following components:   Glucose-Capillary 324 (*)    All other components within normal limits  POCT I-STAT EG7 - Abnormal; Notable for the following  components:   pH, Ven 7.522 (*)    pCO2, Ven 30.5 (*)    pO2, Ven 106.0 (*)    Acid-Base Excess 3.0 (*)    Potassium 3.3 (*)    Calcium, Ion 0.97 (*)    HCT 33.0 (*)    Hemoglobin 11.2 (*)    All other components within normal limits  I-STAT TROPONIN, ED    EKG EKG Interpretation  Date/Time:  Saturday March 30 2018 19:13:54 EST Ventricular Rate:  92 PR Interval:    QRS Duration: 64 QT Interval:  430 QTC Calculation: 532 R Axis:   7 Text Interpretation:  Sinus rhythm Low voltage, precordial leads Nonspecific T abnrm, anterolateral leads Prolonged QT interval similar to Mar 14 2018 Confirmed by Sherwood Gambler [id number]) on 03/30/2018 7:35:30 PM   Radiology Dg Chest 2 View  Result Date: 03/30/2018 CLINICAL DATA:  Fall with fatigue. EXAM: CHEST - 2 VIEW COMPARISON:  03/18/2018 FINDINGS: 1835 hours. Low volumes. The cardio pericardial silhouette is enlarged. The lungs are clear without focal pneumonia, edema, pneumothorax or pleural effusion. The visualized bony structures of the thorax are intact. Degenerative changes again noted left shoulder. Telemetry leads overlie the chest. IMPRESSION: Interval improvement in basilar aeration. Low volumes  without acute cardiopulmonary findings on today's study. Electronically Signed   By: Misty Stanley M.D.   On: 03/30/2018 18:54   Ct Head Wo Contrast  Result Date: 03/30/2018 CLINICAL DATA:  Fall, weakness, fatigue EXAM: CT HEAD WITHOUT CONTRAST TECHNIQUE: Contiguous axial images were obtained from the base of the skull through the vertex without intravenous contrast. COMPARISON:  02/28/2018 FINDINGS: Brain: No acute intracranial abnormality. Specifically, no hemorrhage, hydrocephalus, mass lesion, acute infarction, or significant intracranial injury. Vascular: No hyperdense vessel or unexpected calcification. Skull: No acute calvarial abnormality. Sinuses/Orbits: Visualized paranasal sinuses and mastoids clear. Orbital soft tissues unremarkable.  Other: None IMPRESSION: No acute intracranial abnormality. Electronically Signed   By: Rolm Baptise M.D.   On: 03/30/2018 18:45    Procedures Procedures (including critical care time)  Medications Ordered in ED Medications  acetaminophen (TYLENOL) tablet 650 mg (has no administration in time range)  albuterol (PROVENTIL HFA;VENTOLIN HFA) 108 (90 Base) MCG/ACT inhaler 2 puff (has no administration in time range)  allopurinol (ZYLOPRIM) tablet 300 mg (has no administration in time range)  feeding supplement (PRO-STAT SUGAR FREE 64) liquid 30 mL (has no administration in time range)  amLODipine (NORVASC) tablet 10 mg (has no administration in time range)  antiseptic oral rinse (BIOTENE) solution 15 mL (has no administration in time range)  b complex vitamins tablet 1 tablet (has no administration in time range)  bisacodyl (DULCOLAX) EC tablet 5 mg (has no administration in time range)  loratadine (CLARITIN) tablet 10 mg (has no administration in time range)  collagenase (SANTYL) ointment (has no administration in time range)  Colloidal Oatmeal 1 % CREA 1 application (has no administration in time range)  doxepin (SINEQUAN) capsule 25 mg (has no administration in time range)  famotidine (PEPCID) tablet 20 mg (has no administration in time range)  folic acid (FOLVITE) tablet 1 mg (has no administration in time range)  furosemide (LASIX) tablet 20 mg (has no administration in time range)  gabapentin (NEURONTIN) capsule 300 mg (has no administration in time range)  pravastatin (PRAVACHOL) tablet 40 mg (has no administration in time range)  polyethylene glycol (MIRALAX / GLYCOLAX) packet 17 g (has no administration in time range)  nystatin (MYCOSTATIN/NYSTOP) topical powder (has no administration in time range)  insulin glargine (LANTUS) injection 20 Units (has no administration in time range)  insulin aspart (novoLOG) injection 0-12 Units (has no administration in time range)  hydrOXYzine  (ATARAX/VISTARIL) tablet 25 mg (has no administration in time range)  azelastine (ASTELIN) 0.1 % nasal spray 1 spray (has no administration in time range)    And  fluticasone (FLONASE) 50 MCG/ACT nasal spray 1 spray (has no administration in time range)  potassium chloride SA (K-DUR,KLOR-CON) CR tablet 20 mEq (has no administration in time range)  lactated ringers bolus 1,000 mL (0 mLs Intravenous Stopped 03/30/18 2156)  potassium chloride SA (K-DUR,KLOR-CON) CR tablet 40 mEq (40 mEq Oral Given 03/30/18 2032)     Initial Impression / Assessment and Plan / ED Course  I have reviewed the triage vital signs and the nursing notes.  Pertinent labs & imaging results that were available during my care of the patient were reviewed by me and considered in my medical decision making (see chart for details).        Patient has hyperglycemia and some mild hypokalemia but otherwise her labs are near baseline.  No new renal failure.  No particular findings that would warrant admission.  This generalized weakness is not particularly new.  She  does not want to go back to living facility but accepts that she may need this for better care in the long-term.  She is unable to take care of herself at home.  While she is not able to take care of herself at home, she also does not meet any emergent reason to be admitted.  Thus she will be kept in the ED overnight and have case management and social work see her in the morning to help facilitate long-term acute care if possible and/or home health.  She has a Music therapist but only for a couple hours each day.  Otherwise, does not appear to have any emergent medical issues.  Final Clinical Impressions(s) / ED Diagnoses   Final diagnoses:  Generalized weakness  Hypokalemia    ED Discharge Orders    None       Sherwood Gambler, MD 03/30/18 2253

## 2018-03-30 NOTE — ED Notes (Signed)
Pt transported to CT ?

## 2018-03-31 ENCOUNTER — Other Ambulatory Visit: Payer: Self-pay

## 2018-03-31 LAB — CBG MONITORING, ED
GLUCOSE-CAPILLARY: 208 mg/dL — AB (ref 70–99)
Glucose-Capillary: 229 mg/dL — ABNORMAL HIGH (ref 70–99)
Glucose-Capillary: 283 mg/dL — ABNORMAL HIGH (ref 70–99)

## 2018-03-31 NOTE — Care Management (Signed)
CM spoke with [facility] who was unable to provide any information on transitional care arrangements today due to no available SW today.

## 2018-03-31 NOTE — ED Notes (Signed)
Pt arrived to Rm 48 via bed. Pt noted to be lying on bed w/eyes closed. Respirations even, unlabored. Dinner ordered for pt.

## 2018-03-31 NOTE — Care Management (Signed)
ED CM made 2nd attempted to contact daughter left message for a return call. CSW consulted as well. Updated Nursing staff and discussed with the EDP Dr. Ashok Cordia concerning patient. CSW and CM have been left handoff' to follow up in the am.

## 2018-03-31 NOTE — Care Management (Addendum)
ED CM met with patient to discuss transitional care planning. Patient reports being discharged from [facility] 2 days ago and fell at home. Patient states prior to hospitalization and rehab she lived alone and was able to transfer to w/c and care for ADL's , she also says that she  HHA from [facility] comes 5 days per week to assist her.  Patient states once she was discharged home there was no HHA to assist her. ED CM reached out to contact [facility] to verify services arranged. CM awaiting return call from agencies.

## 2018-03-31 NOTE — Care Management (Signed)
Patient provided permission to contact her daughter Enis Slipper [phone] to discuss her care, no answer left a HIPPA compliant message. Awaiting a return call

## 2018-03-31 NOTE — ED Notes (Addendum)
Pt being bathed. Pt noted w/open wounds to beneath breasts, under abd, between legs, and buttocks - 2 open areas on left have gauze packed and dressing over wounds to mid buttock.

## 2018-03-31 NOTE — ED Notes (Signed)
Pt assessed for any reaction to medication given, pt lungs clear and equal, pt denies SOB, and itching.

## 2018-03-31 NOTE — Care Management (Signed)
ED CM discussed patient with CSW unable to contact SW at facility today to verify the determine discharge plan arranged. ED CSW states he will leave hand off for ED CSW to follow up in the am, for setting up a safe transitional care plan.

## 2018-04-01 LAB — CBG MONITORING, ED: Glucose-Capillary: 208 mg/dL — ABNORMAL HIGH (ref 70–99)

## 2018-04-01 MED ORDER — ZINC OXIDE 40 % EX OINT
TOPICAL_OINTMENT | Freq: Four times a day (QID) | CUTANEOUS | Status: DC
  Administered 2018-04-01 (×2): via TOPICAL
  Administered 2018-04-01: 1 via TOPICAL
  Administered 2018-04-02 – 2018-04-04 (×8): via TOPICAL
  Administered 2018-04-05 (×2): 1 via TOPICAL
  Administered 2018-04-05: 22:00:00 via TOPICAL
  Administered 2018-04-05 – 2018-04-06 (×2): 1 via TOPICAL
  Administered 2018-04-06 (×2): via TOPICAL
  Administered 2018-04-06: 1 via TOPICAL
  Administered 2018-04-07 – 2018-04-12 (×19): via TOPICAL
  Filled 2018-04-01 (×3): qty 57

## 2018-04-01 MED ORDER — DAKINS (1/4 STRENGTH) 0.125 % EX SOLN
Freq: Two times a day (BID) | CUTANEOUS | Status: DC
  Administered 2018-04-01 – 2018-04-03 (×5)
  Filled 2018-04-01 (×2): qty 473

## 2018-04-01 NOTE — Consult Note (Addendum)
[facility] Nurse wound consult note Patient receiving care in [facility] ED 048.  No family present. Reason for Consult: Eval and treat pressure wounds Wound type: Multiple types.  The left sacral wound appears to be an unstageable PI. The wounds on the buttocks are superifical and pink, and appear to be related to MASD. All body folds have fungal rash Pressure Injury POA: Yes Measurement: The left sacral wound is highly malodorous with heavy darkened necrotic tissue deep in the wound bed. It measures 3 cm x 12 cm x 4 cm at the current deepest point.  The care for this wound is:  Pack Dakin's solution moistened gauze into the wound on the left sacral wound. Cover with ABD pads, tape in place. Change each shift. The MASD of the bilateral buttocks is to receive 40% Desitin 4 times daily.  Each body fold is to be washed with soapy water, patted dry, and antifungal powder applied. Monitor the wound area(s) for worsening of condition such as: Signs/symptoms of infection,  Increase in size,  Development of or worsening of odor, Development of pain, or increased pain at the affected locations.  Notify the medical team if any of these develop.  Thank you for the consult.  Discussed plan of care with the patient and bedside nurse.  [facility] nurse will not follow at this time.  Please re-consult the [facility] team if needed.  Val Riles, RN, MSN, CWOCN, CNS-BC, pager [phone]

## 2018-04-01 NOTE — Progress Notes (Addendum)
10:22am-CSW spoke with Heather Petty from [facility] and was informed that they did have Heather Petty recently. Heather Petty expressed that's they did set up home health for Heather Petty once discharged but she isnt sure which home health agency they set Heather Petty up with. Heather Petty expressed that they are currently full for LTC beds and wouldn't be abel to take Heather Petty for long term care at this time. Heather Petty to call CSW back once she consult with social worker to see who Heather Petty is set up with.   8:46am-CSW spoke with Heather Petty at bedside. Heather Petty expressed that Heather Petty is interested in long term care but would like to get those services from home. CSW was made aware that Heather Petty does get services through [facility]. Per chart review "she also says that she  HHA from [facility] comes 5 days per week to assist her". CSW has spoken with RNCM to see if this is accurate. CSW still awaits call back from [facility] to determine if Heather Petty was set up with any other [facility] services once discharged on Friday. Heather Petty did express that if long term care consult be provided to her in the home then she would consider a facility([facility]) for care once again but would like to try and see about getting home with long term care first.   CSW received handoff from weekend CSW. CSW followed up with Linus Orn from [facility] to see what services were set up for Heather Petty once Heather Petty was discharged from their facility. CSW left voicemail at this time . CSW also made aware that per intial note, Heather Petty is not interested in going back to a SNF but appears to be interested in long term care options through [facility] services. CSW will follow back up with Heather Petty to confirm needs.      Virgie Dad Adda Stokes, MSW, [place] [facility] Clinical Social Worker [phone]

## 2018-04-01 NOTE — Discharge Planning (Signed)
EDCM advised by EDP that pt will required extensive wound care upon discharge (The MASD of the bilateral buttocks is to receive 40% Desitin 4 times daily.  Each body fold is to be washed with soapy water, patted dry, and antifungal powder applied).  EDCM reviewed wound care orders with Glyn Ade of [facility]; Dorian Pod advises that wound care order of this detail CAN be obtained in the home for a couple of days.  EDCM and EDSW will continue to follow for safe disposition.

## 2018-04-01 NOTE — Discharge Planning (Signed)
EDCM spoke with Dionne of [facility] to inquire about current [facility] services.  Dionne states pt is not currently active, but has been in the past.  [facility] will continue to follow for disposition needs.

## 2018-04-01 NOTE — Discharge Planning (Signed)
Pt currently active with [facility] for HH services.  Resumption of care requested. Ellen Williams of [facility] notified.  No DME needs identified at this time.  

## 2018-04-01 NOTE — Discharge Planning (Signed)
EDCM and EDSW met with pt at bedside for disposition planning. Pt states she would like to go home with home health and PCS services through [facility].  EDCM reached out to [place]; they will contact [facility] with a start of services date.  EDCM and EDSW will continue to follow.

## 2018-04-01 NOTE — ED Notes (Signed)
Breakfast Tray ordered  

## 2018-04-02 LAB — INFLUENZA PANEL BY PCR (TYPE A & B)
Influenza A By PCR: NEGATIVE
Influenza B By PCR: NEGATIVE

## 2018-04-02 LAB — CBG MONITORING, ED
Glucose-Capillary: 151 mg/dL — ABNORMAL HIGH (ref 70–99)
Glucose-Capillary: 211 mg/dL — ABNORMAL HIGH (ref 70–99)

## 2018-04-02 MED ORDER — BIOTENE DRY MOUTH MT LIQD
15.0000 mL | Freq: Three times a day (TID) | OROMUCOSAL | Status: DC
  Administered 2018-04-03 – 2018-04-12 (×14): 15 mL via OROMUCOSAL

## 2018-04-02 NOTE — ED Notes (Signed)
Dr Lita Mains aware VRE and assessed pt's wounds.

## 2018-04-02 NOTE — Progress Notes (Signed)
LATE ENTRY:    CSW and RNCM met with pt at bedside to discuss further needs. Pt expressed that she was set up with [facility] in the past and loved her Stacie Glaze. CSW and RNCM was asked to call [facility] for pt as pt reported that no one at [facility] knows that she speaks [place]. CSW and RNCM called Earlie Server and was informed that pt would need to call [employer] and reestablish services for herself as Earlie Server has moved on to new clients while pt was in the hospital. Pt became very bothered this stating "well she did everything for me-now awhat am I suppose to do? I don't want anyone else". CSW offered support to [pt and reiterated options. Pt still expressed the desire to go home with further PCS Service or [facility] services. RNCM reached out to [facility] to see what further assistance they are able to provide pt at this time.       Heather Petty, MSW, [place] [facility] Clinical Social Worker [phone]

## 2018-04-02 NOTE — Progress Notes (Signed)
Physical Therapy Evaluation Patient Details Name: Heather Petty MRN: [id number] DOB: 01-12-1951 Today's Date: 04/02/2018   History of Present Illness  Patient is 67 y/o female presenting to hospital due to inability to care for self at home. Patient recently d/c from SNF. PMH includes AKI, asthma, DMII, HTN, schizophrenia, morbid obesity, dermatitis, and chronic sacral wounds.  Clinical Impression  Patient presenting to hospital secondary to problems above and with deficits below. Patient required modA +2 for all bed mobility. Patient refusing further mobility secondary to bilateral LE pain. Patient reports being unable to care for self at home and wanting long term assistance. Given functional mobility deficits and decreased care giver assistance, recommending SNF level therapy. Patient will benefit from acute physical therapy to maximize independence and safety with functional mobility.      Follow Up Recommendations SNF;Supervision/Assistance - 24 hour    Equipment Recommendations  None recommended by PT    Recommendations for Other Services       Precautions / Restrictions Precautions Precautions: Fall Restrictions Weight Bearing Restrictions: No      Mobility  Bed Mobility Overal bed mobility: Needs Assistance Bed Mobility: Supine to Sit;Sit to Supine     Supine to sit: Mod assist;+2 for physical assistance Sit to supine: Mod assist;+2 for physical assistance   General bed mobility comments: Patient required modA +2 for trunk control and LE managment for all bed mobility. Patient required increased time and effort to sit EOB. Patient refusing further mobility due to bilateral LE pain.   Transfers                    Ambulation/Gait                Stairs            Wheelchair Mobility    Modified Rankin (Stroke Patients Only)       Balance Overall balance assessment: Needs assistance Sitting-balance support: No upper extremity  supported Sitting balance-Leahy Scale: Good Sitting balance - Comments: Patient able to maintain sitting EOB without LOB                                     Pertinent Vitals/Pain Pain Assessment: Faces Faces Pain Scale: Hurts even more Pain Location: bilateral feet Pain Descriptors / Indicators: Aching Pain Intervention(s): Limited activity within patient's tolerance;Monitored during session    Home Living Family/patient expects to be discharged to:: Private residence Living Arrangements: Alone Available Help at Discharge: Family;Available PRN/intermittently Type of Home: House Home Access: Ramped entrance     Home Layout: One level Home Equipment: Tub bench;Walker - 4 wheels;Cane - single point;Wheelchair - manual Additional Comments: Home information from previous notes.     Prior Function Level of Independence: Needs assistance   Gait / Transfers Assistance Needed: Patient reports needing assistance to transfer to wheelchair  ADL's / Homemaking Assistance Needed: Requires assistance for ADLs  Comments: Patient reports brother helped her transfer to wheelchair and complete ADLs because she did not have an aide since recent d/c from SNF     Hand Dominance        Extremity/Trunk Assessment   Upper Extremity Assessment Upper Extremity Assessment: Overall WFL for tasks assessed    Lower Extremity Assessment Lower Extremity Assessment: Generalized weakness    Cervical / Trunk Assessment Cervical / Trunk Assessment: Normal  Communication   Communication: No difficulties  Cognition Arousal/Alertness: Awake/alert Behavior  During Therapy: Anxious Overall Cognitive Status: Within Functional Limits for tasks assessed                                        General Comments General comments (skin integrity, edema, etc.): Patient stating unable to care for self at home and looking for long term assistance.  Educated about continued  mobility to maintain strength    Exercises     Assessment/Plan    PT Assessment Patient needs continued PT services  PT Problem List Decreased strength;Decreased range of motion;Decreased activity tolerance;Decreased balance;Decreased mobility;Decreased knowledge of use of DME;Pain;Obesity       PT Treatment Interventions DME instruction;Gait training;Functional mobility training;Therapeutic activities;Therapeutic exercise;Balance training;Patient/family education    PT Goals (Current goals can be found in the Care Plan section)  Acute Rehab PT Goals Patient Stated Goal: "to be left alone" PT Goal Formulation: With patient Time For Goal Achievement: 04/16/18 Potential to Achieve Goals: Fair    Frequency Min 2X/week   Barriers to discharge Decreased caregiver support      Co-evaluation               AM-PAC PT "6 Clicks" Mobility  Outcome Measure Help needed turning from your back to your side while in a flat bed without using bedrails?: A Lot Help needed moving from lying on your back to sitting on the side of a flat bed without using bedrails?: A Lot Help needed moving to and from a bed to a chair (including a wheelchair)?: A Lot Help needed standing up from a chair using your arms (e.g., wheelchair or bedside chair)?: A Lot Help needed to walk in hospital room?: Total Help needed climbing 3-5 steps with a railing? : Total 6 Click Score: 10    End of Session   Activity Tolerance: Patient limited by pain Patient left: in bed;with call bell/phone within reach Nurse Communication: Mobility status PT Visit Diagnosis: Other abnormalities of gait and mobility (R26.89);Muscle weakness (generalized) (M62.81);Pain;History of falling (Z91.81) Pain - Right/Left: (Bilateral) Pain - part of body: Leg    Time: 1211-1222 PT Time Calculation (min) (ACUTE ONLY): 11 min   Charges:   PT Evaluation $PT Eval Moderate Complexity: 1 Mod          Erick Blinks, SPT  Erick Blinks 04/02/2018, 1:44 PM

## 2018-04-02 NOTE — ED Notes (Signed)
Received phone call from Maudie Mercury Bent @ [facility], pt's wound culture results from 03/27/2018 are positive for VRE. Note sent to Dr Lita Mains.

## 2018-04-02 NOTE — ED Provider Notes (Signed)
Patient is awaiting placement.  She has chronic sacral ulcerations for which wound care is seeing her.  Had superficial culture done several days ago which came back showing few VRE.  Evaluated the patient's wounds.  Has mild yellowish discharge but no obvious signs of infection.  No tenderness to palpation.  No crepitance.  discussed with Dr. Graylon Good.  Advises to continue wound care and that superficial wound cultures are nonspecific.   Julianne Rice, MD 04/02/18 [phone]

## 2018-04-02 NOTE — ED Notes (Addendum)
Spoke w/SW re: pt.

## 2018-04-02 NOTE — ED Notes (Signed)
Pt bathed by NT's x 2 d/t soiled w/urine. Pt now on her cell phone talking w/her sister.

## 2018-04-02 NOTE — Discharge Planning (Signed)
EDCM spoke with Hassell Done from [facility] who states pt is approved for 3 hours/day beginning today.

## 2018-04-02 NOTE — Discharge Planning (Signed)
EDCM reached out to [facility] to see when they could start services.  [facility] is still working on a start of service date for her.  Will update the team when information comes available.

## 2018-04-02 NOTE — ED Notes (Signed)
Repeat temperature was 100.5. Dr. Lita Mains notified.

## 2018-04-02 NOTE — ED Notes (Signed)
Dinner ordered 

## 2018-04-02 NOTE — Progress Notes (Signed)
CSW spoke with pt at bedside once more. Pt appeared to be annoyed that CSW had woken her back up to talk. CSW addressed CSW's reason for being there. CSW advised pt that PT had spoken with CSW and suggested that pt expressed a desire to be placed into a long term care facility at this time. When CSW spoke with pt about this pt expressed a desire to return home. CSW advised pt that pt has two options at this time.   1. Go home with services from [facility] for three hours a day. 2. Go a facility for long term care where pt would  need to sign over Social Security check for care.    After CSW informed pt of this pt expressed she wanted to go home. In a few seconds, pt then expressed that she would go to a facility. CSW advised pt that CSW would began working on this but also expressed ot pt that if she goes to a facility, it would be for long term care and that pt may not be able to sign self out. Pt expressed being upset about this as pt doesn't want a facility due to "not wanting to do therapy". CSW advised pt that Medicaid usually doesn't pay for much therapy therefore pt would have to speak with facility that pt goes to about the ability and inability to do therapy on pt. At this time RNCM has faxed pt out. CSW spoke with Chantel from [facility] and was informed that she has looked at pt but unsure if she can take pt due to wound on buttocks. CSW will await more responses from facilities via the hub.     Heather Petty Heather Petty, MSW, [place] [facility] Clinical Social Worker [phone]

## 2018-04-02 NOTE — ED Notes (Signed)
Carb Modified Diet was ordered for Lunch. 

## 2018-04-02 NOTE — ED Notes (Signed)
Pt refused to have dressings changed, CBG performed, and refusing to eat dinner. States "You are not letting me rest!".

## 2018-04-03 ENCOUNTER — Emergency Department (HOSPITAL_COMMUNITY): Payer: Medicare Other

## 2018-04-03 ENCOUNTER — Ambulatory Visit: Payer: Self-pay | Admitting: Internal Medicine

## 2018-04-03 DIAGNOSIS — Z7951 Long term (current) use of inhaled steroids: Secondary | ICD-10-CM | POA: Diagnosis not present

## 2018-04-03 DIAGNOSIS — Z23 Encounter for immunization: Secondary | ICD-10-CM | POA: Diagnosis not present

## 2018-04-03 DIAGNOSIS — E7800 Pure hypercholesterolemia, unspecified: Secondary | ICD-10-CM | POA: Diagnosis present

## 2018-04-03 DIAGNOSIS — M109 Gout, unspecified: Secondary | ICD-10-CM | POA: Diagnosis present

## 2018-04-03 DIAGNOSIS — E1129 Type 2 diabetes mellitus with other diabetic kidney complication: Secondary | ICD-10-CM | POA: Diagnosis present

## 2018-04-03 DIAGNOSIS — J45909 Unspecified asthma, uncomplicated: Secondary | ICD-10-CM | POA: Diagnosis present

## 2018-04-03 DIAGNOSIS — K219 Gastro-esophageal reflux disease without esophagitis: Secondary | ICD-10-CM | POA: Diagnosis present

## 2018-04-03 DIAGNOSIS — E876 Hypokalemia: Secondary | ICD-10-CM | POA: Diagnosis present

## 2018-04-03 DIAGNOSIS — T148XXA Other injury of unspecified body region, initial encounter: Secondary | ICD-10-CM | POA: Diagnosis not present

## 2018-04-03 DIAGNOSIS — M1990 Unspecified osteoarthritis, unspecified site: Secondary | ICD-10-CM | POA: Diagnosis present

## 2018-04-03 DIAGNOSIS — Z6841 Body Mass Index (BMI) 40.0 and over, adult: Secondary | ICD-10-CM | POA: Diagnosis not present

## 2018-04-03 DIAGNOSIS — Z794 Long term (current) use of insulin: Secondary | ICD-10-CM | POA: Diagnosis not present

## 2018-04-03 DIAGNOSIS — E1142 Type 2 diabetes mellitus with diabetic polyneuropathy: Secondary | ICD-10-CM | POA: Diagnosis present

## 2018-04-03 DIAGNOSIS — Z87891 Personal history of nicotine dependence: Secondary | ICD-10-CM | POA: Diagnosis not present

## 2018-04-03 DIAGNOSIS — L089 Local infection of the skin and subcutaneous tissue, unspecified: Secondary | ICD-10-CM | POA: Diagnosis not present

## 2018-04-03 DIAGNOSIS — R531 Weakness: Secondary | ICD-10-CM | POA: Diagnosis present

## 2018-04-03 DIAGNOSIS — N183 Chronic kidney disease, stage 3 (moderate): Secondary | ICD-10-CM | POA: Diagnosis present

## 2018-04-03 DIAGNOSIS — E1122 Type 2 diabetes mellitus with diabetic chronic kidney disease: Secondary | ICD-10-CM | POA: Diagnosis present

## 2018-04-03 DIAGNOSIS — F200 Paranoid schizophrenia: Secondary | ICD-10-CM | POA: Diagnosis present

## 2018-04-03 DIAGNOSIS — I129 Hypertensive chronic kidney disease with stage 1 through stage 4 chronic kidney disease, or unspecified chronic kidney disease: Secondary | ICD-10-CM | POA: Diagnosis present

## 2018-04-03 DIAGNOSIS — R748 Abnormal levels of other serum enzymes: Secondary | ICD-10-CM | POA: Diagnosis present

## 2018-04-03 DIAGNOSIS — L89150 Pressure ulcer of sacral region, unstageable: Secondary | ICD-10-CM | POA: Diagnosis present

## 2018-04-03 LAB — CBC WITH DIFFERENTIAL/PLATELET
Abs Immature Granulocytes: 0.05 10*3/uL (ref 0.00–0.07)
BASOS ABS: 0 10*3/uL (ref 0.0–0.1)
Basophils Relative: 0 %
Eosinophils Absolute: 0.3 10*3/uL (ref 0.0–0.5)
Eosinophils Relative: 4 %
HCT: 33.9 % — ABNORMAL LOW (ref 36.0–46.0)
Hemoglobin: 10.5 g/dL — ABNORMAL LOW (ref 12.0–15.0)
IMMATURE GRANULOCYTES: 1 %
Lymphocytes Relative: 13 %
Lymphs Abs: 1.2 10*3/uL (ref 0.7–4.0)
MCH: 30.3 pg (ref 26.0–34.0)
MCHC: 31 g/dL (ref 30.0–36.0)
MCV: 98 fL (ref 80.0–100.0)
Monocytes Absolute: 0.8 10*3/uL (ref 0.1–1.0)
Monocytes Relative: 9 %
NRBC: 0 % (ref 0.0–0.2)
Neutro Abs: 6.7 10*3/uL (ref 1.7–7.7)
Neutrophils Relative %: 73 %
Platelets: 133 10*3/uL — ABNORMAL LOW (ref 150–400)
RBC: 3.46 MIL/uL — ABNORMAL LOW (ref 3.87–5.11)
RDW: 15.3 % (ref 11.5–15.5)
WBC: 9.1 10*3/uL (ref 4.0–10.5)

## 2018-04-03 LAB — GLUCOSE, CAPILLARY
Glucose-Capillary: 183 mg/dL — ABNORMAL HIGH (ref 70–99)
Glucose-Capillary: 108 mg/dL — ABNORMAL HIGH (ref 70–99)
Glucose-Capillary: 176 mg/dL — ABNORMAL HIGH (ref 70–99)

## 2018-04-03 LAB — COMPREHENSIVE METABOLIC PANEL
ALT: 41 U/L (ref 0–44)
AST: 37 U/L (ref 15–41)
Albumin: 2.1 g/dL — ABNORMAL LOW (ref 3.5–5.0)
Alkaline Phosphatase: 448 U/L — ABNORMAL HIGH (ref 38–126)
Anion gap: 10 (ref 5–15)
BUN: 11 mg/dL (ref 8–23)
CHLORIDE: 103 mmol/L (ref 98–111)
CO2: 25 mmol/L (ref 22–32)
CREATININE: 0.8 mg/dL (ref 0.44–1.00)
Calcium: 8.3 mg/dL — ABNORMAL LOW (ref 8.9–10.3)
GFR calc Af Amer: >60 mL/min (ref >60)
GFR calc non Af Amer: >60 mL/min (ref >60)
GLUCOSE: 165 mg/dL — AB (ref 70–99)
Potassium: 3.5 mmol/L (ref 3.5–5.1)
Sodium: 138 mmol/L (ref 135–145)
Total Bilirubin: 1.2 mg/dL (ref 0.3–1.2)
Total Protein: 5.3 g/dL — ABNORMAL LOW (ref 6.5–8.1)

## 2018-04-03 LAB — CBG MONITORING, ED: Glucose-Capillary: 188 mg/dL — ABNORMAL HIGH (ref 70–99)

## 2018-04-03 LAB — LACTIC ACID, PLASMA: Lactic Acid, Venous: 1 mmol/L (ref 0.5–1.9)

## 2018-04-03 MED ORDER — ACETAMINOPHEN 325 MG PO TABS
650.0000 mg | ORAL_TABLET | Freq: Once | ORAL | Status: AC
  Administered 2018-04-03: 650 mg via ORAL
  Filled 2018-04-03: qty 2

## 2018-04-03 MED ORDER — INSULIN ASPART 100 UNIT/ML ~~LOC~~ SOLN
0.0000 [IU] | Freq: Three times a day (TID) | SUBCUTANEOUS | Status: DC
  Administered 2018-04-03: 4 [IU] via SUBCUTANEOUS
  Administered 2018-04-04: 3 [IU] via SUBCUTANEOUS
  Administered 2018-04-04: 4 [IU] via SUBCUTANEOUS
  Administered 2018-04-05: 3 [IU] via SUBCUTANEOUS

## 2018-04-03 MED ORDER — FLEET ENEMA 7-19 GM/118ML RE ENEM: 1.0000 | ENEMA | Freq: Once | RECTAL | Status: DC | PRN

## 2018-04-03 MED ORDER — INFLUENZA VAC SPLIT HIGH-DOSE 0.5 ML IM SUSY
0.5000 mL | PREFILLED_SYRINGE | INTRAMUSCULAR | Status: AC
  Administered 2018-04-04: 0.5 mL via INTRAMUSCULAR
  Filled 2018-04-03: qty 0.5

## 2018-04-03 MED ORDER — NAPROXEN 250 MG PO TABS
500.0000 mg | ORAL_TABLET | Freq: Two times a day (BID) | ORAL | Status: AC
  Administered 2018-04-03 – 2018-04-06 (×6): 500 mg via ORAL
  Filled 2018-04-03 (×6): qty 2

## 2018-04-03 MED ORDER — ACETAMINOPHEN 650 MG RE SUPP: 650.0000 mg | Freq: Four times a day (QID) | RECTAL | Status: DC | PRN

## 2018-04-03 MED ORDER — RISPERIDONE 0.5 MG PO TABS
0.5000 mg | ORAL_TABLET | Freq: Two times a day (BID) | ORAL | Status: DC
  Administered 2018-04-03 – 2018-04-12 (×18): 0.5 mg via ORAL
  Filled 2018-04-03 (×19): qty 1

## 2018-04-03 MED ORDER — TRAMADOL HCL 50 MG PO TABS
50.0000 mg | ORAL_TABLET | Freq: Four times a day (QID) | ORAL | Status: DC | PRN
  Administered 2018-04-07 – 2018-04-12 (×3): 50 mg via ORAL
  Filled 2018-04-03 (×3): qty 1

## 2018-04-03 MED ORDER — CEFAZOLIN SODIUM-DEXTROSE 2-4 GM/100ML-% IV SOLN
2.0000 g | Freq: Three times a day (TID) | INTRAVENOUS | Status: DC
  Administered 2018-04-03 – 2018-04-05 (×5): 2 g via INTRAVENOUS
  Filled 2018-04-03 (×6): qty 100

## 2018-04-03 MED ORDER — BENZONATATE 100 MG PO CAPS: 100.0000 mg | ORAL_CAPSULE | Freq: Three times a day (TID) | ORAL | 0 refills | Status: DC | PRN

## 2018-04-03 MED ORDER — BENAZEPRIL HCL 20 MG PO TABS
20.0000 mg | ORAL_TABLET | Freq: Every day | ORAL | Status: DC
  Administered 2018-04-03 – 2018-04-12 (×10): 20 mg via ORAL
  Filled 2018-04-03 (×10): qty 1

## 2018-04-03 MED ORDER — ACETAMINOPHEN 325 MG PO TABS: 650.0000 mg | ORAL_TABLET | Freq: Four times a day (QID) | ORAL | Status: DC | PRN

## 2018-04-03 MED ORDER — INSULIN ASPART 100 UNIT/ML ~~LOC~~ SOLN: 0.0000 [IU] | Freq: Every day | SUBCUTANEOUS | Status: DC

## 2018-04-03 MED ORDER — CEFAZOLIN SODIUM-DEXTROSE 2-4 GM/100ML-% IV SOLN
2.0000 g | Freq: Once | INTRAVENOUS | Status: AC
  Administered 2018-04-03: 2 g via INTRAVENOUS
  Filled 2018-04-03: qty 100

## 2018-04-03 MED ORDER — MAGNESIUM CITRATE PO SOLN: 1.0000 | Freq: Once | ORAL | Status: DC

## 2018-04-03 MED ORDER — ALBUTEROL SULFATE (2.5 MG/3ML) 0.083% IN NEBU: 2.5000 mg | INHALATION_SOLUTION | RESPIRATORY_TRACT | Status: DC | PRN

## 2018-04-03 MED ORDER — ONDANSETRON HCL 4 MG PO TABS: 4.0000 mg | ORAL_TABLET | Freq: Four times a day (QID) | ORAL | Status: DC | PRN

## 2018-04-03 MED ORDER — ENOXAPARIN SODIUM 40 MG/0.4ML ~~LOC~~ SOLN
40.0000 mg | SUBCUTANEOUS | Status: DC
  Administered 2018-04-04 (×2): 40 mg via SUBCUTANEOUS
  Filled 2018-04-03 (×2): qty 0.4

## 2018-04-03 MED ORDER — COLCHICINE 0.6 MG PO TABS
0.6000 mg | ORAL_TABLET | Freq: Two times a day (BID) | ORAL | Status: AC
  Administered 2018-04-03 – 2018-04-06 (×6): 0.6 mg via ORAL
  Filled 2018-04-03 (×6): qty 1

## 2018-04-03 MED ORDER — INSULIN ASPART 100 UNIT/ML ~~LOC~~ SOLN
6.0000 [IU] | Freq: Three times a day (TID) | SUBCUTANEOUS | Status: DC
  Administered 2018-04-03 – 2018-04-05 (×4): 6 [IU] via SUBCUTANEOUS

## 2018-04-03 MED ORDER — ONDANSETRON HCL 4 MG/2ML IJ SOLN: 4.0000 mg | Freq: Four times a day (QID) | INTRAMUSCULAR | Status: DC | PRN

## 2018-04-03 NOTE — ED Provider Notes (Signed)
Patient presented to ED for generalized weakness, frequent falls. She is being followed by SW/CM for placement. Overnight, she has been spiking fevers which is new. She was refusing wound care to her sacral decubs yesterday.  I personally evaluated the patient this morning with nurse and examined wounds.  Wound care was done as well which was much needed.  She did have some yellow discharge from the most severe wound sacral wound and mild surrounding erythema, however concerned this does not look as bad as I would expect for someone who has now developed a temperature.  Repeat temperature today with me was 100.6.  Noted the flu swab was negative yesterday.  Further skin evaluation shows wound to the left shoulder.  This was dressed on 2/20 and does not appear to have been changed since then.  Per chart review, this area was a "dry scar" at time of discharge on 2/12. When the bandage was removed, there was a foul-smelling yellow to green discharge covering the wound.  There is some surrounding erythema  ?  If this is due to cellulitis or breakdown from the dressing itself.  Patient reports worsening pain to this area.    .  Do not feel that patient is currently medically stable for discharge to a facility at this time. Will start with repeat labs, anticipate admission.   Chart was reviewed further. Superficial culture of the sacral wounds did come back + for VRE. This was discussed with ID by previous team yesterday where Dr. Graylon Good recommended to continue to wound care.   Reconsulted Dr. Graylon Good this morning who has reviewed photo and case thus far. Discussed new found wound and persistent fevers. Recommended repeat labs, blood cx's, starting on Ancef and admission to the hospital.   Labs baseline.   Hospitalist consulted who will admit.    Jerrine Urschel, Ozella Almond, PA-C 04/03/18 1213    Jola Schmidt, MD 04/03/18 1729

## 2018-04-03 NOTE — ED Notes (Signed)
Carb Modified Diet was ordered for Lunch. 

## 2018-04-03 NOTE — ED Notes (Signed)
Breakfast tray ordered 

## 2018-04-03 NOTE — Consult Note (Signed)
[facility] Nurse wound consult note Patient is in the [facility] ED.  I saw the patient two days ago, also in the ED. Please refer to my detailed note from 04/01/18 at 09:27. Reason for Consult:"sacral wounds" I am reinstating my previous orders from 04/01/18. Monitor the wound area(s) for worsening of condition such as: Signs/symptoms of infection,  Increase in size,  Development of or worsening of odor, Development of pain, or increased pain at the affected locations.  Notify the medical team if any of these develop.  Thank you for the consult.  [facility] nurse will not follow at this time.  Please re-consult the [facility] team if needed.  Val Riles, RN, MSN, CWOCN, CNS-BC, pager [phone]

## 2018-04-03 NOTE — ED Notes (Signed)
Attempted to stick pt for labs and IV- unsuccessful

## 2018-04-03 NOTE — Progress Notes (Signed)
Pharmacy Antibiotic Note  Heather Petty is a 68 y.o. female admitted on 03/30/2018 with wound infection.  Pharmacy has been consulted for cefazolin dosing. Tmax 10.6, LA 1, WBC WNL. SCr 0.8 on admit, CrCl~85.  Plan: Cefazolin 2g IV q8h Monitor clinical progress, c/s, renal function F/u de-escalation plan/LOT  Height: 5\' 1"  (154.9 cm) Weight: 286 lb 9.6 oz (130 kg) IBW/kg (Calculated) : 47.8  Temp (24hrs), Avg:100.3 F (37.9 C), Min:99.5 F (37.5 C), Max:100.6 F (38.1 C)  Recent Labs  Lab 03/30/18 1858  WBC 8.7  CREATININE 1.07*    Estimated Creatinine Clearance: 63.2 mL/min (A) (by C-G formula based on SCr of 1.07 mg/dL (H)).    No Known Allergies  Antimicrobials this admission: 2/26 cefazolin >>   Dose adjustments this admission:  Microbiology results:   Elicia Lamp, PharmD, BCPS Please check AMION for all [place] contact numbers Clinical Pharmacist 04/03/2018 11:37 AM

## 2018-04-03 NOTE — ED Notes (Signed)
Pt states she must take medications with applesauce.

## 2018-04-03 NOTE — ED Notes (Signed)
Sacral heart applied to stage 2 wounds. R buttock wound cleaned and packed with dakin soaked gauze and secured with tape. Pt tolerated well. [place]wick changed and breakfast tray given. Fresh gown applied

## 2018-04-03 NOTE — H&P (Signed)
History and Physical    Heather Petty QPR:[id number] DOB: Nov 09, 1950 DOA: 03/30/2018  PCP: [name], MD Consultants:  None Patient coming from:  Home - lives alone; NOK: Daughter  Chief Complaint: Fall  HPI: Heather Petty is a 68 y.o. female with medical history significant of schizophrenia; morbid obesity (BMI 54); HTN; HLD; and DM presenting with weakness and fatigue on 2/22.  Recently here.  Discharged and she fell at home, difficulty moving her legs.  She developed a fever while here.  She has a long-standing pressure ulcer, present since November and getting better.  She also has an ulcer under her arm.  No URI symptoms, no urinary symptoms other than constipation.     ED Course:  Here, awaiting placement for SNF.  She developed fever yesterday.  She had superficial cultures of sacral ulcer - VRE.  Dr. Baxter Flattery said this should not be treated.  Repeat fever today.  Wound on left upper arm and foul-smelling, purulent drainage.   Spoke with Dr. Baxter Flattery again, and she recommends admission and Ancef.  She will definitely need placement.  ID does not appear to be actively consulting.  Blood cultures pending.  Review of Systems: As per HPI; otherwise review of systems reviewed and negative.   Ambulatory Status: Uses a wheelchair but too weak to transfer now  Past Medical History:  Diagnosis Date  . AKI (acute kidney injury) ([place]) 12/17/2017  . Arthritis   . Asthma   . Depression   . Diabetes mellitus   . Gout   . High cholesterol   . Hypertension   . Morbid obesity ([place])   . Pneumonia 12/17/2017  . Schizophrenia [facility])     Past Surgical History:  Procedure Laterality Date  . CYST EXCISION    . DENTAL SURGERY    . TONSILLECTOMY      Social History   Socioeconomic History  . Marital status: Single    Spouse name: Not on file  . Number of children: Not on file  . Years of education: Not on file  . Highest education level: Not on file  Occupational History  . Not  on file  Social Needs  . Financial resource strain: Not on file  . Food insecurity:    Worry: Not on file    Inability: Not on file  . Transportation needs:    Medical: Not on file    Non-medical: Not on file  Tobacco Use  . Smoking status: Former Research scientist (life sciences)  . Smokeless tobacco: Never Used  Substance and Sexual Activity  . Alcohol use: No  . Drug use: No  . Sexual activity: Not on file  Lifestyle  . Physical activity:    Days per week: Not on file    Minutes per session: Not on file  . Stress: Not on file  Relationships  . Social connections:    Talks on phone: Not on file    Gets together: Not on file    Attends religious service: Not on file    Active member of club or organization: Not on file    Attends meetings of clubs or organizations: Not on file    Relationship status: Not on file  . Intimate partner violence:    Fear of current or ex partner: Not on file    Emotionally abused: Not on file    Physically abused: Not on file    Forced sexual activity: Not on file  Other Topics Concern  . Not on file  Social  History Narrative  . Not on file    No Known Allergies  Family History  Family history unknown: Yes    Prior to Admission medications   Medication Sig Start Date End Date Taking? Authorizing Provider  acetaminophen (TYLENOL) 325 MG tablet Take 650 mg by mouth 4 (four) times daily as needed for headache (pain).   Yes [provider]  albuterol (VENTOLIN HFA) 108 (90 Base) MCG/ACT inhaler Inhale 2 puffs into the lungs every 4 (four) hours as needed for wheezing or shortness of breath.   Yes [provider]  allopurinol (ZYLOPRIM) 300 MG tablet Take 300 mg by mouth daily.   Yes [provider]  Amino Acids-Protein Hydrolys (FEEDING SUPPLEMENT, PRO-STAT SUGAR FREE 64,) LIQD Take 30 mLs by mouth 3 (three) times daily with meals.   Yes [provider]  amLODipine (NORVASC) 10 MG tablet Take 10 mg by mouth daily.   Yes [provider]  antiseptic oral rinse (BIOTENE) LIQD 15 mLs by Mouth Rinse route 3 (three) times daily. Swish and spit   Yes [provider]  Azelastine-Fluticasone (DYMISTA) 137-50 MCG/ACT SUSP Place 1 spray into both nostrils 2 (two) times daily as needed (for allergies).    Yes [provider]  b complex vitamins tablet Take 1 tablet by mouth daily.   Yes [provider]  bisacodyl (DULCOLAX) 5 MG EC tablet Take 1 tablet (5 mg total) by mouth daily as needed for moderate constipation. Patient taking differently: Take 5 mg by mouth daily as needed for mild constipation.  12/24/17  Yes Lady Deutscher, MD  cetirizine (ZYRTEC) 10 MG tablet Take 10 mg by mouth daily.    Yes [provider]  collagenase (SANTYL) ointment Apply topically daily. Patient taking differently: Apply 1 application topically See admin instructions. Clean with Dakin's Solution (half strength), apply skin prep periwound. Apply Santyl Ointment to necrotic tissue in wound bed, cover with Calcium Alginate, cover with bordered foam dressing daily. 02/09/18  Yes Elgergawy, Silver Huguenin, MD  Colloidal Oatmeal (EUCERIN ECZEMA RELIEF) 1 % CREA Apply 1 application topically 2 (two) times daily.   Yes [provider]  cyanocobalamin (,VITAMIN B-12,) 1000 MCG/ML injection Inject 1 mL (1,000 mcg total) into the muscle once a week. Patient taking differently: Inject 1,000 mcg into the muscle every Monday.  02/09/18  Yes Elgergawy, Silver Huguenin, MD  doxepin (SINEQUAN) 25 MG capsule Take 25 mg by mouth 2 (two) times daily.    Yes [provider]  famotidine (PEPCID) 20 MG tablet Take 20 mg by mouth at bedtime.    Yes [provider]  folic acid (FOLVITE) 1 MG tablet Take 1 mg by mouth daily.   Yes [provider]  furosemide (LASIX) 20 MG tablet Take 20 mg by mouth daily.   Yes [provider]  gabapentin (NEURONTIN) 300 MG capsule Take 300 mg by mouth 3 (three) times  daily.   Yes [provider]  hydrocortisone 2.5 % cream Apply 1 application topically See admin instructions. Apply topically to rash on face twice daily   Yes [provider]  hydrOXYzine (ATARAX/VISTARIL) 25 MG tablet Take 25 mg by mouth 3 (three) times daily.   Yes [provider]  insulin aspart (NOVOLOG) 100 UNIT/ML injection Take subcu QA CHS, 140-199 - 2 units, 200-250 - 6 units, 251-299 - 8 units,  300-349 - 10 units,  350 or above 14 units. Patient taking differently: Inject 0-12 Units into the skin See  admin instructions. Inject 0-12 units into the skin 4 times a day (before meals and at bedtime) per sliding scale: BGL <60 or >400  = CALL MD; 60-200 = 0 units; 201-250 = 2 units; 251-300 = 4 units; 301-350 = 6 units; 351-400 = 8 units; 401-450 = 10 units; >450 = 12 units 03/05/18  Yes Thurnell Lose, MD  insulin glargine (LANTUS) 100 UNIT/ML injection Inject 0.2 mLs (20 Units total) into the skin at bedtime. 03/05/18  Yes Thurnell Lose, MD  ipratropium-albuterol (DUONEB) 0.5-2.5 (3) MG/3ML SOLN Take 3 mLs by nebulization 4 (four) times daily.    Yes [provider]  levofloxacin (LEVAQUIN) 750 MG tablet Take 750 mg by mouth daily.   Yes [provider]  Multiple Vitamins-Minerals (THERA-M MULTIPLE VITAMINS PO) Take 1 tablet by mouth daily.   Yes [provider]  nystatin (MYCOSTATIN/NYSTOP) powder Apply topically See admin instructions. Apply topically twice daily -  between folds, beneath breasts, bilateral axillae, and under pannus   Yes [provider]  polyethylene glycol (MIRALAX / GLYCOLAX) packet Take 17 g by mouth 2 (two) times daily. Patient taking differently: Take 17 g by mouth daily as needed for mild constipation.  12/24/17  Yes Lady Deutscher, MD  pravastatin (PRAVACHOL) 40 MG tablet Take 40 mg by mouth daily.   Yes [provider]  risperiDONE (RISPERDAL) 0.5 MG tablet Take 1 tablet (0.5 mg total)  by mouth 2 (two) times daily. 12/23/17 04/28/18 Yes Purohit, Konrad Dolores, MD  silver sulfADIAZINE (SILVADENE) 1 % cream Apply topically 2 (two) times daily. 02/09/18  Yes Elgergawy, Silver Huguenin, MD  traMADol (ULTRAM) 50 MG tablet Take 50 mg by mouth 4 (four) times daily as needed (pain).    Yes [provider]  triamcinolone (KENALOG) 0.147 MG/GM topical spray Apply 1 spray topically See admin instructions. Apply topically to head in the direction of hair growth daily at bedtime   Yes [provider]  vitamin B-6 (PYRIDOXINE) 25 MG tablet Take 25 mg by mouth daily.   Yes [provider]  vitamin C (ASCORBIC ACID) 500 MG tablet Take 500 mg by mouth 2 (two) times daily.   Yes [provider]  Vitamin D, Ergocalciferol, (DRISDOL) 1.25 MG (50000 UT) CAPS capsule Take 50,000 Units by mouth every Monday.    Yes [provider]  Zinc Sulfate (ZINC-220 PO) Take 220 mg by mouth at bedtime.    Yes [provider]  gabapentin (NEURONTIN) 600 MG tablet Take 0.5 tablets (300 mg total) by mouth 3 (three) times daily. Patient not taking: Reported on 03/30/2018 12/23/17 03/14/18  Cristy Folks, MD    Physical Exam: Vitals:   04/03/18 1202 04/03/18 1402 04/03/18 1606 04/03/18 1651  BP: 130/84 126/72 125/66 (!) 143/59  Pulse: 87  90 93  Resp: (!) 24  20   Temp: 98.8 F (37.1 C)  98.7 F (37.1 C) (!) 100.9 F (38.3 C)  TempSrc: Oral  Oral Oral  SpO2: 93%  93% 94%  Weight:      Height:         . General:  Appears calm and comfortable and is NAD . Eyes:  PERRL, EOMI, normal lids, iris . ENT:  grossly normal hearing, lips & tongue, mmm . Neck:  no LAD, masses or thyromegaly . Cardiovascular:  RRR, no m/r/g. No LE edema.  Marland Kitchen Respiratory:   CTA bilaterally with no wheezes/rales/rhonchi.  Normal respiratory effort. . Abdomen:  soft, NT, ND,  NABS . Skin: LUE ulceration with foul-smelling, purulent drainage in the proximal-medial arm just distal to the  axilla     . Musculoskeletal:  grossly normal tone BUE/BLE, good ROM, no bony abnormality.  Left foot with significant TTP along the first MTP and swelling, erythema c/w gout (and patient reports gout) . Psychiatric: blunted mood and affect, speech fluent and appropriate, AOx3 . Neurologic:  CN 2-12 grossly intact, moves all extremities in coordinated fashion, sensation intact    Radiological Exams on Admission: Dg Chest Portable 1 View  Result Date: 04/03/2018 CLINICAL DATA:  Fevers EXAM: PORTABLE CHEST 1 VIEW COMPARISON:  03/30/2018 FINDINGS: Cardiac shadow is stable. Aortic calcifications are again seen. The lungs are well aerated bilaterally. Mild right basilar atelectasis is noted. No sizable effusion is seen. No acute bony abnormality is noted. IMPRESSION: Mild right basilar atelectasis. Electronically Signed   By: Inez Catalina M.D.   On: 04/03/2018 09:28    EKG: not done today   Labs on Admission: I have personally reviewed the available labs and imaging studies at the time of the admission.  Pertinent labs:   Glucose 165 AP 448 Albumin 2.1 Lactate 1.0 WBC 9.1 Hgb 10.5 Platelets 133 Flu negative  Assessment/Plan Principal Problem:   Wound infection Active Problems:   Gout   Type II diabetes mellitus with renal manifestations (HCC)   HTN (hypertension)   Paranoid schizophrenia (HCC)   Hyperlipidemia associated with type 2 diabetes mellitus (HCC)   Wound of sacral region   Morbid obesity with BMI of 50.0-59.9, adult (HCC)   Elevated alkaline phosphatase level   Wound infection -Patient holding in the ER due to immobility and need for placement, who developed fever -Source appears to be related to a wound on her left upper arm  -There was reportedly a bandage in place that had not been changed in some time -Probable pressure ulcer which is now infected -Per ID consult (telephone only), recommend Ancef for now -Normal WBC count -She was given Vanc and Zosyn in  the ER -Admission in this patient is warranted due to:  -high-risk comorbid condition is indicated by very poorly controlled DM and super-morbid obesity with bed-bound status  Gout -patient also with gout infection of left great toe -While avoidance of NSAIDs is generally preferred where possible, steroids in this obese patient with active infection and poorly controlled DM appears to be less favorable -Will give Naprosyn 500 mg BID x 3 days for now and continue to follow -Will also give Colchicine 0.6 mg BID x 3 days -Will closely follow renal function with daily BMP x 3 days -Continue Allopurinol  Sacral pressure ulcer -Per EDP, this is stable and much improved -Wound care has been closely following this issue while the patient has been in the ER  DM -Poor control while in the ER and chronically -Hold Glucotrol -Continue Lantus -Will cover with resistant-scale SSI  HTN -Continue Norvasc and Lotensin  HLD -Continue Pravachol  Morbid obesity -Very sedentary and even moreso now given inability to even transfer -It seems very unlikely that this patient will be successful at home -PT consult previously ordered -Very likely to need placement  Schizophrenia -Continue Risperdal, Doxepin  Elevated AP -Uncertain cause, but it has been chronically elevated and worsening -Treat infection and consider further inpatient vs. Outpatient evaluation -neither wound appears to be deep enough to cause bony lysis as the cause, but this may merit consideration if not improving.    DVT prophylaxis:  Lovenox  Code  Status:  Full - confirmed with patient Family Communication: None present Disposition Plan:  Home once clinically improved Consults called: Wound care; SW  Admission status: Admit - It is my clinical opinion that admission to INPATIENT is reasonable and necessary because of the expectation that this patient will require hospital care that crosses at least 2 midnights to treat this  condition based on the medical complexity of the problems presented.  Given the aforementioned information, the predictability of an adverse outcome is felt to be significant.    Karmen Bongo MD [facility]   How to contact the [facility] Attending or Consulting provider [place] or covering provider during after hours [place], for this patient?  1. Check the care team in [facility] and look for a) attending/consulting TR[facility] provider listed and b) the [facility] team listed 2. Log into [email or web address] and use [place]'s universal password to access. If you do not have the password, please contact the hospital operator. 3. Locate the [facility] provider you are looking for under [facility] and page to a number that you can be directly reached. 4. If you still have difficulty reaching the provider, please page the [facility] (Director on Call) for the Hospitalists listed on amion for assistance.   04/03/2018, 5:46 PM

## 2018-04-03 NOTE — ED Notes (Signed)
Dressing removed from L upper arm and purulent drainage coming from wound. Cleaned and redressed with telfa and tape. Dressing removed from R arm and area is clean with no drainage. Tegarderm applied. PA at bedside to examine

## 2018-04-03 NOTE — Progress Notes (Addendum)
Pt has dressing to LUE, Sacrum and B/L buttocks that are clean, dry and intact.  Pt refuses this nurse to take dressings down as wound nurse just assessed and dressed wounds prior to admission to unit. Please see WOC-RN notes from 04/01/2018 @ 0927 and 04/03/18 @ 0917. [email or web address]

## 2018-04-03 NOTE — ED Notes (Signed)
Family at bedside. 

## 2018-04-03 NOTE — ED Notes (Signed)
Pt refused dressings at this time. States "I just want to be left alone." "Just let me sleep". Will continue to monitor.

## 2018-04-03 NOTE — Progress Notes (Addendum)
1:30pm-CSW made aware that pt will be being admitted for further medical needs. CSW will continue to follow for  further needs.   CSW and RNCM followed up with bed offers for pt at this time. On yesterday evening CSW was updated by RN that pt's cultures for her wounds came back positive for VRE. CSW and RNCM was unable to place this information on pt's FL2 as it had already been sent out. CSW followed up with RN this morning and was informed that pt is now running a fever (100.1) and is currently not being treated for VRE. CSW was advised by RN that pt was seen by Infectious Disease MD and as informed that the cultures were superficial. Per MD [place]'s note wounds "Has mild yellowish discharge but no obvious signs of infection.  No tenderness to palpation.  No crepitance.  discussed with Dr. Graylon Good". CSW was made aware by RN that pt is refusing dressing changes to wounds at this time. CSW spoke with PA Merry Proud to see if pt would be being medically admitted as RN expressed pt may benefit from it at this time. PA expressed that he would reach out to MD that coem sin at 8am about further admission for pt.     CSW advised RN and PA that CSW may encounter barriers to getting pt placed. CSW to follow back up with MD to see if pt is being admitted, if not CSW or RNCM to call facilities that have offered on pt and give update of medical needs. CSW has also spoken with Nathaniel Man about this case in the event that other barriers arise in placing pt.      Virgie Dad Dreana Britz, MSW, [place] [facility] Clinical Social Worker [phone]

## 2018-04-03 NOTE — Clinical Social Work Note (Signed)
Clinical Social Work Assessment  Patient Details  Name: Heather Petty MRN: [id number] Date of Birth: 09/22/1950  Date of referral:  04/03/18               Reason for consult:  Facility Placement                Permission sought to share information with:    Permission granted to share information::  No  Name::        Agency::     Relationship::     Contact Information:     Housing/Transportation Living arrangements for the past 2 months:  Post-Acute Facility(pt was at [facility] and then discharged home. ) Source of Information:  Patient Patient Interpreter Needed:  None Criminal Activity/Legal Involvement Pertinent to Current Situation/Hospitalization:  No - Comment as needed Significant Relationships:  None Lives with:  Self Do you feel safe going back to the place where you live?  No Need for family participation in patient care:  Yes (Comment)  Care giving concerns:  CSW consulted for possible SNF placement. CSW spoke with pt at bedside about this.    Social Worker assessment / plan:  CSW spoke with pt at bedside. CSW was informed that per pt, pt is from home alone. PT reported that she was at [facility] recently and then discharged. Pt expressed that she enjoyed being at [facility] but knew she had to leave. Pt reports that she has a daughter and son in law but they are not involved as she is upset with them. CSW sought further details on why pt is upset with her supports and pt declined to tell CSW. CSW sought further details on care that pt gets through [employer]. CSW was informed that per pt and note from [facility], pt gets [facility] 5 times a week. Pt expressed that her aides name is Earlie Server and that she enjoys her.    CSW was notified that pt is wanting long term care but isn't interested in a facility. CSW was told by pt that she would rather be at home with [facility] and get LTC from them. CSW advised pt that CSW and RNCM would have to look into that for pt.  RNCM spoke with [facility] and was informed that pt is currently not set up  with the services since pt was at a facility. RNCM attempted to reinstate services however [facility] is only able to provide care for three hours a day. CSW and RNCM updated pt of this and pt expressed being upset that Earlie Server can no longer be her aide and that she can only get limited time with the services. CSW advised pt that pt may be safer in a LTC facility if pt is truly unable to care for self. Pt reports not knowing what to do and being confused. PT reluctantly agreed to go to SNF. CSW explained the process to pt and informed her that she may have to sign over her check top the facility that takes her to provide care to her. Pt again not happy about this however she expressed "gotta do what I gotta do". RNCM has began working on placing pt at this time, however barriers are arising as process continues.  Employment status:  Retired Forensic scientist:  Medicare(Medicare part B only ) PT Recommendations:  [place] / Referral to community resources:  [place]  Patient/Family's Response to care:  Pt is confused and not sure  what to do in regards to caring for self. Pt continues  to sway back and forth about wanting placement and not wanting placement. CSW did advise pt that we have to do what is the safest for pt to ensure that her medical needs are met. Pt appeared understanding of this.   Patient/Family's Understanding of and Emotional Response to Diagnosis, Current Treatment, and Prognosis:  Pt is not very understanding of care needs and desires to be at home. Pt gave CSW and RNCM permission to fa her information out to facilities. CSW attempted to follow up with offers, however other barriers have arise to placing pt at this time.   Emotional Assessment Appearance:  Appears stated age Attitude/Demeanor/Rapport:  Complaining Affect (typically observed):  Overwhelmed,  Frustrated Orientation:  Oriented to Self, Oriented to Place, Oriented to Situation Alcohol / Substance use:  Not Applicable Psych involvement (Current and /or in the community):  No (Comment)(not at this time. )  Discharge Needs  Concerns to be addressed:  Care Coordination Readmission within the last 30 days:  Yes Current discharge risk:  Dependent with Mobility, Lack of support system, Lives alone Barriers to Discharge:  Continued Medical Work up, Unsafe home situation   Wetzel Bjornstad, Latanya Presser 04/03/2018, 7:43 AM

## 2018-04-04 DIAGNOSIS — E7800 Pure hypercholesterolemia, unspecified: Secondary | ICD-10-CM | POA: Diagnosis present

## 2018-04-04 DIAGNOSIS — F200 Paranoid schizophrenia: Secondary | ICD-10-CM | POA: Diagnosis present

## 2018-04-04 DIAGNOSIS — E1142 Type 2 diabetes mellitus with diabetic polyneuropathy: Secondary | ICD-10-CM | POA: Diagnosis present

## 2018-04-04 DIAGNOSIS — E1129 Type 2 diabetes mellitus with other diabetic kidney complication: Secondary | ICD-10-CM | POA: Diagnosis present

## 2018-04-04 DIAGNOSIS — M109 Gout, unspecified: Principal | ICD-10-CM

## 2018-04-04 DIAGNOSIS — K219 Gastro-esophageal reflux disease without esophagitis: Secondary | ICD-10-CM | POA: Diagnosis present

## 2018-04-04 DIAGNOSIS — E1122 Type 2 diabetes mellitus with diabetic chronic kidney disease: Secondary | ICD-10-CM | POA: Diagnosis present

## 2018-04-04 DIAGNOSIS — N183 Chronic kidney disease, stage 3 (moderate): Secondary | ICD-10-CM | POA: Diagnosis present

## 2018-04-04 DIAGNOSIS — L89150 Pressure ulcer of sacral region, unstageable: Secondary | ICD-10-CM | POA: Diagnosis present

## 2018-04-04 DIAGNOSIS — R531 Weakness: Secondary | ICD-10-CM | POA: Diagnosis present

## 2018-04-04 DIAGNOSIS — I129 Hypertensive chronic kidney disease with stage 1 through stage 4 chronic kidney disease, or unspecified chronic kidney disease: Secondary | ICD-10-CM | POA: Diagnosis present

## 2018-04-04 DIAGNOSIS — E876 Hypokalemia: Secondary | ICD-10-CM | POA: Diagnosis present

## 2018-04-04 DIAGNOSIS — Z7951 Long term (current) use of inhaled steroids: Secondary | ICD-10-CM | POA: Diagnosis not present

## 2018-04-04 DIAGNOSIS — Z23 Encounter for immunization: Secondary | ICD-10-CM | POA: Diagnosis not present

## 2018-04-04 DIAGNOSIS — J45909 Unspecified asthma, uncomplicated: Secondary | ICD-10-CM | POA: Diagnosis present

## 2018-04-04 DIAGNOSIS — Z87891 Personal history of nicotine dependence: Secondary | ICD-10-CM | POA: Diagnosis not present

## 2018-04-04 DIAGNOSIS — R748 Abnormal levels of other serum enzymes: Secondary | ICD-10-CM | POA: Diagnosis not present

## 2018-04-04 DIAGNOSIS — M1990 Unspecified osteoarthritis, unspecified site: Secondary | ICD-10-CM | POA: Diagnosis present

## 2018-04-04 DIAGNOSIS — T148XXA Other injury of unspecified body region, initial encounter: Secondary | ICD-10-CM | POA: Diagnosis not present

## 2018-04-04 DIAGNOSIS — Z794 Long term (current) use of insulin: Secondary | ICD-10-CM | POA: Diagnosis not present

## 2018-04-04 DIAGNOSIS — Z6841 Body Mass Index (BMI) 40.0 and over, adult: Secondary | ICD-10-CM

## 2018-04-04 LAB — CBC
HCT: 29.7 % — ABNORMAL LOW (ref 36.0–46.0)
Hemoglobin: 9.3 g/dL — ABNORMAL LOW (ref 12.0–15.0)
MCH: 30.3 pg (ref 26.0–34.0)
MCHC: 31.3 g/dL (ref 30.0–36.0)
MCV: 96.7 fL (ref 80.0–100.0)
Platelets: 132 10*3/uL — ABNORMAL LOW (ref 150–400)
RBC: 3.07 MIL/uL — ABNORMAL LOW (ref 3.87–5.11)
RDW: 15 % (ref 11.5–15.5)
WBC: 5.8 10*3/uL (ref 4.0–10.5)
nRBC: 0 % (ref 0.0–0.2)

## 2018-04-04 LAB — GLUCOSE, CAPILLARY
GLUCOSE-CAPILLARY: 124 mg/dL — AB (ref 70–99)
GLUCOSE-CAPILLARY: 195 mg/dL — AB (ref 70–99)
Glucose-Capillary: 134 mg/dL — ABNORMAL HIGH (ref 70–99)
Glucose-Capillary: 155 mg/dL — ABNORMAL HIGH (ref 70–99)

## 2018-04-04 LAB — BASIC METABOLIC PANEL
Anion gap: 10 (ref 5–15)
BUN: 15 mg/dL (ref 8–23)
CHLORIDE: 104 mmol/L (ref 98–111)
CO2: 25 mmol/L (ref 22–32)
Calcium: 7.7 mg/dL — ABNORMAL LOW (ref 8.9–10.3)
Creatinine, Ser: 1 mg/dL (ref 0.44–1.00)
GFR calc Af Amer: >60 mL/min (ref >60)
GFR calc non Af Amer: 57 mL/min — ABNORMAL LOW (ref >60)
Glucose, Bld: 139 mg/dL — ABNORMAL HIGH (ref 70–99)
Potassium: 3.3 mmol/L — ABNORMAL LOW (ref 3.5–5.1)
SODIUM: 139 mmol/L (ref 135–145)

## 2018-04-04 MED ORDER — POTASSIUM CHLORIDE CRYS ER 20 MEQ PO TBCR
40.0000 meq | EXTENDED_RELEASE_TABLET | Freq: Once | ORAL | Status: AC
  Administered 2018-04-04: 40 meq via ORAL
  Filled 2018-04-04: qty 2

## 2018-04-04 MED ORDER — COLLAGENASE 250 UNIT/GM EX OINT
TOPICAL_OINTMENT | Freq: Every day | CUTANEOUS | Status: DC
  Administered 2018-04-04: 14:00:00 via TOPICAL
  Administered 2018-04-05: 1 via TOPICAL
  Administered 2018-04-06 – 2018-04-12 (×7): via TOPICAL
  Filled 2018-04-04: qty 30

## 2018-04-04 NOTE — Consult Note (Addendum)
[place] Nurse wound consult note Consult requested for left arm, buttocks, sacrum, and skin folds.  Patient familiar to [facility] team from recent visit 04/01/18.  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   Sacral wound Wound type: Unstageable pressure injury Pressure Injury POA: Yes Measurement:3 cms x 15 cms x 3 cms with 4 cm tunneling at 6 o'clock; two separate deeper areas separated by a narrow island of skin that may eventually communicate  Wound bed: 50% slough 50% pink Drainage (amount, consistency, odor) moderate amount of tan drainage, malodorous  Periwound: intact  Dressing procedure/placement/frequency: Apply Santyl nickel thick layer, pack with moistened gauze using Q-tip to fill defect, cover with ABD pad and tape.   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  It will be difficult to heal areas if patient remains incontinent keeping areas moist. Air mattress ordered to reduce pressure and increase air flow.   Assessed skin folds and previously noted macerated areas are improving.  Patient currently on antifungal powder, no further topical treatment needed.    Please re-consult if further assistance is needed.  Thank-you,  Julien Girt MSN, [place], [place], [place],  [place]

## 2018-04-04 NOTE — Plan of Care (Signed)
  Problem: Education: Goal: Knowledge of General Education information will improve Description: Including pain rating scale, medication(s)/side effects and non-pharmacologic comfort measures Outcome: Progressing   Problem: Health Behavior/Discharge Planning: Goal: Ability to manage health-related needs will improve Outcome: Progressing   Problem: Clinical Measurements: Goal: Will remain free from infection Outcome: Progressing   Problem: Safety: Goal: Ability to remain free from injury will improve Outcome: Progressing   

## 2018-04-04 NOTE — Progress Notes (Signed)
PROGRESS NOTE        PATIENT DETAILS Name: Heather Petty Age: 68 y.o. Sex: female Date of Birth: March 06, 1950 Admit Date: 03/30/2018 Admitting Physician Karmen Bongo, MD CXK:GYJEHUD, Christean Grief, MD  Brief Narrative: Patient is a 68 y.o. female with history of morbid obesity, schizophrenia, hypertension, dyslipidemia, DM, bedbound status (nonambulatory for 10 years) presented to the ED on 2/22 with weakness and falls.  She was being observed in the ED while awaiting placement, she subsequently developed a fever and was then admitted to the hospitalist service.  See below for further details  Subjective: Wants to go home-wants to know why she cannot get more home care at home.  Assessment/Plan: Fever: Concern for infection of the left arm wound-but this morning no purulence on my exam.  No other foci of infection apparent clinically.  Could have fever secondary to gout flare as well continue Ancef and await cultures.  Acute gouty flare involving left foot: Seems to have improved with naproxen-follow.  Insulin-dependent DM-2: CBG stable-continue Lantus 20 units daily, NovoLog 6 units before meals, and SSI.  Follow and optimize  Hypertension: Controlled-continue Norvasc and Lotensin  Dyslipidemia: Continue statin  Peripheral neuropathy: Likely related to diabetes, continue Neurontin  Schizophrenia: Appears to be stable-continue risperidone and doxepin  Chronically elevated alkaline phosphatase: Suspect secondary to bedbound status/osseous issue rather than liver issue.  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  Morbid obesity   DVT Prophylaxis: Prophylactic Lovenox   Code Status: Full code    Family Communication: Grandson at bedside  Disposition Plan: Remain inpatient-but will plan on Home health vs SNF on discharge  Antimicrobial agents: Anti-infectives (From admission, onward)   Start     Dose/Rate Route Frequency Ordered Stop   04/03/18 1900  ceFAZolin (ANCEF) IVPB 2g/100 mL premix     2 g 200 mL/hr over 30 Minutes Intravenous Every 8 hours 04/03/18 1136     04/03/18 1045  ceFAZolin (ANCEF) IVPB 2g/100 mL premix     2 g 200 mL/hr over 30 Minutes Intravenous  Once 04/03/18 1042 04/03/18 1128      Procedures: None  CONSULTS:  None  Time spent: 25- minutes-Greater than 50% of this time was spent in counseling, explanation of diagnosis, planning of further management, and coordination of care.  MEDICATIONS: Scheduled Meds: . allopurinol  300 mg Oral Daily  . amLODipine  10 mg Oral Daily  . antiseptic oral rinse  15 mL Mouth Rinse TID  . B-complex with vitamin C  1 tablet Oral Daily  . benazepril  20 mg Oral Daily  . colchicine  0.6 mg Oral BID  . collagenase   Topical Daily  . doxepin  25 mg Oral BID  . enoxaparin (LOVENOX) injection  40 mg Subcutaneous Q24H  . famotidine  20 mg Oral QHS  . feeding supplement (PRO-STAT SUGAR FREE 64)  30 mL Oral TID WC  . folic acid  1 mg Oral Daily  . furosemide  20 mg Oral Daily  . gabapentin  300 mg Oral TID  . hydrocerin  1 application Topical BID  . hydrOXYzine  25 mg Oral TID  . Influenza vac split quadrivalent PF  0.5 mL  Intramuscular Tomorrow-1000  . insulin aspart  0-20 Units Subcutaneous TID WC  . insulin aspart  0-5 Units Subcutaneous QHS  . insulin aspart  6 Units Subcutaneous TID WC  . insulin glargine  20 Units Subcutaneous QHS  . liver oil-zinc oxide   Topical QID  . loratadine  10 mg Oral Daily  . magnesium citrate  1 Bottle Oral Once  . naproxen  500 mg Oral BID WC  . nystatin   Topical BID  . pravastatin  40 mg Oral Daily  . risperiDONE  0.5 mg Oral BID   Continuous Infusions: .  ceFAZolin  (ANCEF) IV Stopped (04/04/18 0249)   PRN Meds:.acetaminophen **OR** acetaminophen, albuterol, azelastine **AND** fluticasone, bisacodyl, ondansetron **OR** ondansetron (ZOFRAN) IV, polyethylene glycol, sodium phosphate, traMADol   PHYSICAL EXAM: Vital signs: Vitals:   04/03/18 1651 04/03/18 2300 04/04/18 0625 04/04/18 1036  BP: (!) 143/59 (!) 91/49 92/64 (!) 118/56  Pulse: 93 69 71   Resp:  16 16   Temp: (!) 100.9 F (38.3 C) 99.3 F (37.4 C) 97.7 F (36.5 C)   TempSrc: Oral Oral Oral   SpO2: 94% 92% 94%   Weight:      Height:       Filed Weights   03/30/18 1756  Weight: 130 kg   Body mass index is 54.15 kg/m.   General appearance :Awake, alert, not in any distress. Speech Clear.  Eyes:Pink conjunctiva HEENT: Atraumatic and Normocephalic Neck: supple, no JVD. No cervical lymphadenopathy.  Resp:Good air entry bilaterally, no added sounds  CVS: S1 S2 regular, no murmurs.  GI: Bowel sounds present, Non tender and not distended with no gaurding, rigidity or rebound.No organomegaly Extremities: B/L Lower Ext shows no edema, both legs are warm to touch Neurology: Generalized weakness-but nonfocal Musculoskeletal:No digital cyanosis Skin:No Rash, warm and dry  I have personally reviewed following labs and imaging studies  LABORATORY DATA: CBC: Recent Labs  Lab 03/30/18 1858 03/30/18 1906 04/03/18 1020 04/04/18 0429  WBC 8.7  --  9.1 5.8  NEUTROABS 6.8  --  6.7  --   HGB 10.8* 11.2* 10.5* 9.3*  HCT 36.3 33.0* 33.9* 29.7*  MCV 102.0*  --  98.0 96.7  PLT 140*  --  133* 132*    Basic Metabolic Panel: Recent Labs  Lab 03/30/18 1858 03/30/18 1906 04/03/18 1020 04/04/18 0429  NA 139 137 138 139  K 3.2* 3.3* 3.5 3.3*  CL 103  --  103 104  CO2 21*  --  25 25  GLUCOSE 342*  --  165* 139*  BUN 16  --  11 15  CREATININE 1.07*  --  0.80 1.00  CALCIUM 8.5*  --  8.3* 7.7*    GFR: Estimated Creatinine Clearance: 67.6 mL/min (by C-G formula based on SCr of 1  mg/dL).  Liver Function Tests: Recent Labs  Lab 04/03/18 1020  AST 37  ALT 41  ALKPHOS 448*  BILITOT 1.2  PROT 5.3*  ALBUMIN 2.1*   No results for input(s): LIPASE, AMYLASE in the last 168 hours. No results for input(s): AMMONIA in the last 168 hours.  Coagulation Profile: No results for input(s): INR, PROTIME in the last 168 hours.  Cardiac Enzymes: No results for input(s): CKTOTAL, CKMB, CKMBINDEX, TROPONINI in the last 168 hours.  BNP (last 3 results) No results for input(s): PROBNP in the last 8760 hours.  HbA1C: No results for input(s): HGBA1C in the last 72 hours.  CBG: Recent Labs  Lab 04/03/18 1206 04/03/18 1821  04/03/18 2243 04/03/18 2250 04/04/18 0817  GLUCAP 188* 183* 176* 108* 124*    Lipid Profile: No results for input(s): CHOL, HDL, LDLCALC, TRIG, CHOLHDL, LDLDIRECT in the last 72 hours.  Thyroid Function Tests: No results for input(s): TSH, T4TOTAL, FREET4, T3FREE, THYROIDAB in the last 72 hours.  Anemia Panel: No results for input(s): VITAMINB12, FOLATE, FERRITIN, TIBC, IRON, RETICCTPCT in the last 72 hours.  Urine analysis:    Component Value Date/Time   COLORURINE STRAW (A) 03/30/2018 1933   APPEARANCEUR CLEAR 03/30/2018 1933   LABSPEC 1.016 03/30/2018 1933   PHURINE 7.0 03/30/2018 1933   GLUCOSEU >=500 (A) 03/30/2018 1933   HGBUR NEGATIVE 03/30/2018 1933   BILIRUBINUR NEGATIVE 03/30/2018 1933   KETONESUR 20 (A) 03/30/2018 1933   PROTEINUR NEGATIVE 03/30/2018 1933   UROBILINOGEN 0.2 12/27/2010 1858   NITRITE NEGATIVE 03/30/2018 1933   LEUKOCYTESUR NEGATIVE 03/30/2018 1933    Sepsis Labs: Lactic Acid, Venous    Component Value Date/Time   LATICACIDVEN 1.0 04/03/2018 1020    MICROBIOLOGY: Recent Results (from the past 240 hour(s))  Aerobic Culture (superficial specimen)     Status: None   Collection Time: 03/27/18  2:00 PM  Result Value Ref Range Status   Specimen Description   Final    BACK Performed at [facility], [address], [place], [place] [address]    Special Requests   Final    NONE Performed at [facility], [address] [place], [place] [address]    Gram Stain   Final    FEW WBC PRESENT, PREDOMINANTLY PMN FEW GRAM POSITIVE COCCI RARE GRAM VARIABLE ROD Performed at [facility], [place] [address], [place],  [address]    Culture FEW VANCOMYCIN RESISTANT ENTEROCOCCUS  Final   Report Status 03/30/2018 FINAL  Final   Organism ID, Bacteria VANCOMYCIN RESISTANT ENTEROCOCCUS  Final      Susceptibility   Vancomycin resistant enterococcus - MIC*    AMPICILLIN >=32 RESISTANT Resistant     VANCOMYCIN >=32 RESISTANT Resistant     GENTAMICIN SYNERGY SENSITIVE Sensitive     LINEZOLID 2 SENSITIVE Sensitive     * FEW VANCOMYCIN RESISTANT ENTEROCOCCUS    RADIOLOGY STUDIES/RESULTS: Dg Chest 2 View  Result Date: 03/30/2018 CLINICAL DATA:  Fall with fatigue. EXAM: CHEST - 2 VIEW COMPARISON:  03/18/2018 FINDINGS: 1835 hours. Low volumes. The cardio pericardial silhouette is enlarged. The lungs are clear without focal pneumonia, edema, pneumothorax or pleural effusion. The visualized bony structures of the thorax are intact. Degenerative changes again noted left shoulder. Telemetry leads overlie the chest. IMPRESSION: Interval improvement in basilar aeration. Low volumes without acute cardiopulmonary findings on today's study. Electronically Signed   By: Misty Stanley M.D.   On: 03/30/2018 18:54   Ct Head Wo Contrast  Result Date: 03/30/2018 CLINICAL DATA:  Fall, weakness, fatigue EXAM: CT HEAD WITHOUT CONTRAST TECHNIQUE: Contiguous axial images were obtained from the base of the skull through the vertex without intravenous contrast. COMPARISON:  02/28/2018 FINDINGS: Brain: No acute intracranial abnormality. Specifically, no hemorrhage, hydrocephalus, mass lesion, acute infarction, or significant intracranial injury. Vascular: No hyperdense vessel or unexpected calcification.  Skull: No acute calvarial abnormality. Sinuses/Orbits: Visualized paranasal sinuses and mastoids clear. Orbital soft tissues unremarkable. Other: None IMPRESSION: No acute intracranial abnormality. Electronically Signed   By: Rolm Baptise M.D.   On: 03/30/2018 18:45   Dg Chest Portable 1 View  Result Date: 04/03/2018 CLINICAL DATA:  Fevers EXAM: PORTABLE CHEST 1 VIEW COMPARISON:  03/30/2018 FINDINGS: Cardiac shadow is stable. Aortic calcifications are again seen. The lungs are well aerated bilaterally. Mild right basilar atelectasis is noted. No sizable effusion is seen. No acute bony abnormality is noted. IMPRESSION: Mild right basilar atelectasis. Electronically Signed   By: Inez Catalina M.D.   On: 04/03/2018 09:28   Dg Chest Port 1 View  Result Date: 03/18/2018 CLINICAL DATA:  Wheezing EXAM: PORTABLE CHEST 1 VIEW COMPARISON:  03/14/2018 FINDINGS: Cardiomegaly. Vascular congestion. Right lower lobe airspace opacity with small to moderate right effusion. No confluent opacity or effusion on the left. No acute bony abnormality. IMPRESSION: Cardiomegaly, vascular congestion. Small to moderate right pleural effusion with right lower lobe atelectasis or pneumonia. Electronically Signed   By: Rolm Baptise M.D.   On: 03/18/2018 20:14   Dg Chest Port 1 View  Result Date: 03/14/2018 CLINICAL DATA:  Shortness of breath EXAM: PORTABLE CHEST 1 VIEW COMPARISON:  03/05/2018 FINDINGS: There is bibasilar atelectasis. There is elevation of the right diaphragm. There is no focal consolidation. There is no pleural effusion or pneumothorax. The heart and mediastinal contours are unremarkable. The osseous structures are unremarkable. IMPRESSION: No active disease. Electronically Signed   By: Kathreen Devoid   On: 03/14/2018 20:25   US Abdomen Limited Ruq  Result Date: 03/16/2018 CLINICAL DATA:  Elevated LFTs EXAM: ULTRASOUND ABDOMEN LIMITED RIGHT UPPER QUADRANT COMPARISON:  None. FINDINGS: Gallbladder: No gallstones or wall  thickening visualized. No sonographic Murphy sign noted by sonographer. Common bile duct: Diameter: 4 mm Liver: Increased in echogenicity. No focal lesion identified portal vein is patent on color Doppler imaging with normal direction of blood flow towards the liver. IMPRESSION: Hepatic steatosis. No cholelithiasis or sonographic evidence for acute cholecystitis. Electronically Signed   By: Lovey Newcomer M.D.   On: 03/16/2018 20:25     LOS: 1 day   Oren Binet, MD  [facility]  If 7PM-7AM, please contact night-coverage  Please page via [email or web address]  Go to [email or web address] and use [facility]'s universal password to access. If you do not have the password, please contact the hospital operator.  Locate the [facility] provider you are looking for under [facility] and page to a number that you can be directly reached. If you still have difficulty reaching the provider, please page the [facility] (Director on Call) for the Hospitalists listed on amion for assistance.  04/04/2018, 12:02 PM

## 2018-04-04 NOTE — Discharge Instructions (Signed)

## 2018-04-05 DIAGNOSIS — T148XXA Other injury of unspecified body region, initial encounter: Secondary | ICD-10-CM | POA: Diagnosis not present

## 2018-04-05 DIAGNOSIS — R748 Abnormal levels of other serum enzymes: Secondary | ICD-10-CM | POA: Diagnosis not present

## 2018-04-05 DIAGNOSIS — I10 Essential (primary) hypertension: Secondary | ICD-10-CM | POA: Diagnosis not present

## 2018-04-05 DIAGNOSIS — M109 Gout, unspecified: Secondary | ICD-10-CM | POA: Diagnosis not present

## 2018-04-05 LAB — BASIC METABOLIC PANEL
Anion gap: 8 (ref 5–15)
BUN: 19 mg/dL (ref 8–23)
CO2: 25 mmol/L (ref 22–32)
Calcium: 7.6 mg/dL — ABNORMAL LOW (ref 8.9–10.3)
Chloride: 106 mmol/L (ref 98–111)
Creatinine, Ser: 0.83 mg/dL (ref 0.44–1.00)
GFR calc Af Amer: >60 mL/min (ref >60)
GFR calc non Af Amer: >60 mL/min (ref >60)
Glucose, Bld: 119 mg/dL — ABNORMAL HIGH (ref 70–99)
Potassium: 3.8 mmol/L (ref 3.5–5.1)
Sodium: 139 mmol/L (ref 135–145)

## 2018-04-05 LAB — GLUCOSE, CAPILLARY
Glucose-Capillary: 56 mg/dL — ABNORMAL LOW (ref 70–99)
Glucose-Capillary: 66 mg/dL — ABNORMAL LOW (ref 70–99)
Glucose-Capillary: 81 mg/dL (ref 70–99)
Glucose-Capillary: 124 mg/dL — ABNORMAL HIGH (ref 70–99)
Glucose-Capillary: 98 mg/dL (ref 70–99)
Glucose-Capillary: 144 mg/dL — ABNORMAL HIGH (ref 70–99)

## 2018-04-05 MED ORDER — CEPHALEXIN 500 MG PO CAPS: 500.0000 mg | ORAL_CAPSULE | Freq: Three times a day (TID) | ORAL | 0 refills | Status: DC

## 2018-04-05 MED ORDER — COLCHICINE 0.6 MG PO TABS: 0.6000 mg | ORAL_TABLET | Freq: Two times a day (BID) | ORAL | Status: DC

## 2018-04-05 MED ORDER — CEPHALEXIN 500 MG PO CAPS
500.0000 mg | ORAL_CAPSULE | Freq: Three times a day (TID) | ORAL | Status: DC
  Administered 2018-04-05 – 2018-04-12 (×22): 500 mg via ORAL
  Filled 2018-04-05 (×23): qty 1

## 2018-04-05 MED ORDER — ENOXAPARIN SODIUM 80 MG/0.8ML ~~LOC~~ SOLN
65.0000 mg | SUBCUTANEOUS | Status: DC
  Administered 2018-04-05 – 2018-04-12 (×8): 65 mg via SUBCUTANEOUS
  Filled 2018-04-05 (×8): qty 0.8

## 2018-04-05 MED ORDER — INSULIN GLARGINE 100 UNIT/ML ~~LOC~~ SOLN: 15.0000 [IU] | Freq: Every day | SUBCUTANEOUS | 11 refills | Status: DC

## 2018-04-05 NOTE — Progress Notes (Signed)
Pharmacy Antibiotic Note  Heather Petty is a 68 y.o. female admitted on 03/30/2018 with wound infection.  She was started on cefazolin. Dr. Sloan Leiter will change to abx to Keflex today.   Pt is also morbidly obese. Ok to change to Lovenox 0.42m/kg q24  Plan: Dc ancef Keflex 5079mTID Change Lovenox to 6583mQ qday  Height: 5' 1"  (154.9 cm) Weight: 286 lb 9.6 oz (130 kg) IBW/kg (Calculated) : 47.8  Temp (24hrs), Avg:97.6 F (36.4 C), Min:97.5 F (36.4 C), Max:97.6 F (36.4 C)  Recent Labs  Lab 03/30/18 1858 04/03/18 1020 04/04/18 0429 04/05/18 0407  WBC 8.7 9.1 5.8  --   CREATININE 1.07* 0.80 1.00 0.83  LATICACIDVEN  --  1.0  --   --     Estimated Creatinine Clearance: 81.5 mL/min (by C-G formula based on SCr of 0.83 mg/dL).    No Known Allergies  Antimicrobials this admission: 2/26 cefazolin >> 2/28 2/28 keflex>>  Dose adjustments this admission:  Microbiology results: 2/26 blood>> 2/26 urine>>  MinOnnie BoerharmD, BCISan DimasAHIVP, CPP Infectious Disease Pharmacist 04/05/2018 9:52 AM

## 2018-04-05 NOTE — Evaluation (Signed)
Occupational Therapy Evaluation Patient Details Name: Heather Petty MRN: [id number] DOB: 06-06-1950 Today's Date: 04/05/2018    History of Present Illness Patient is 68 y/o female presenting to hospital due to inability to care for self at home. Patient recently d/c from SNF. PMH includes AKI, asthma, DMII, HTN, schizophrenia, morbid obesity, dermatitis, and chronic sacral wounds.   Clinical Impression   Pt admitted with the above diagnoses and presents with below problem list. Pt will benefit from continued acute OT to address the below listed deficits and maximize independence with basic ADLs prior to d/c to venue below. At baseline pt was needing assist to getting in w/c and for bathing and dressing. Pt currently mod +2 physical assist with bed mobility, mod-max +2 A for LB ADLs. Utilized stedy for [employer]. Pt sitting up in recliner eating breakfast at start of session.       Follow Up Recommendations  SNF    Equipment Recommendations  Other (comment)(defer to next venue)    Recommendations for Other Services       Precautions / Restrictions Precautions Precautions: Fall Restrictions Weight Bearing Restrictions: No      Mobility Bed Mobility Overal bed mobility: Needs Assistance Bed Mobility: Supine to Sit     Supine to sit: Mod assist;+2 for physical assistance     General bed mobility comments: Patient required modA +2 for trunk control and LE managment for all bed mobility. Patient required increased time and effort to sit EOB. Pt agreed to attempt stedy lift to transfer to chair  Transfers Overall transfer level: Needs assistance   Transfers: Sit to/from Stand Sit to Stand: Mod assist;+2 physical assistance         General transfer comment: Assist to power-up to full stand as well as gain/maintain standing balance. Pt able to stand for limited time d/t complaints of increasing pain in BLE feet.     Balance Overall balance assessment: Needs  assistance Sitting-balance support: No upper extremity supported Sitting balance-Leahy Scale: Good(limited time in seated d/t pain from wound) Sitting balance - Comments: Patient able to maintain sitting EOB without LOB   Standing balance support: Bilateral upper extremity supported;During functional activity Standing balance-Leahy Scale: Poor Standing balance comment: Reliant on UE support + stedy                            ADL either performed or assessed with clinical judgement   ADL Overall ADL's : Needs assistance/impaired Eating/Feeding: Set up;Sitting   Grooming: Set up;Brushing hair;Sitting   Upper Body Bathing: Moderate assistance;Sitting   Lower Body Bathing: Maximal assistance;+2 for physical assistance   Upper Body Dressing : Moderate assistance;Sitting   Lower Body Dressing: Maximal assistance;+2 for physical assistance;Sit to/from Health and safety inspector Details (indicate cue type and reason): stedy Toileting- Clothing Manipulation and Hygiene: Maximal assistance;+2 for physical assistance;Sit to/from Nurse, children's Details (indicate cue type and reason): stedy Functional mobility during ADLs: (nonambulatory at baseline) General ADL Comments: Pt completed bed mobility and stood 3x during pericare and positioning of stedy.      Vision         Perception     Praxis      Pertinent Vitals/Pain Pain Assessment: Faces Faces Pain Scale: Hurts even more Pain Location: bilateral feet Pain Descriptors / Indicators: Aching Pain Intervention(s): Monitored during session;Repositioned     Hand Dominance Right   Extremity/Trunk Assessment Upper Extremity Assessment Upper  Extremity Assessment: Generalized weakness;LUE deficits/detail LUE Deficits / Details: dressing on ulcer in L arm. partially coming off, nursing notified LUE Coordination: WNL   Lower Extremity Assessment Lower Extremity Assessment: Defer to PT evaluation    Cervical / Trunk Assessment Cervical / Trunk Assessment: Other exceptions Cervical / Trunk Exceptions: Forward head posture with rounded shoulders   Communication Communication Communication: No difficulties   Cognition Arousal/Alertness: Awake/alert Behavior During Therapy: Anxious Overall Cognitive Status: Within Functional Limits for tasks assessed                                 General Comments: Answwering questions appropriately. No family present to confirm accuracy but overall matches with chart review.   General Comments       Exercises General Exercises - Lower Extremity Ankle Circles/Pumps: AROM;10 reps;Both;Supine Heel Slides: AROM;10 reps;Both;Supine   Shoulder Instructions      Home Living Family/patient expects to be discharged to:: Skilled nursing facility Living Arrangements: Alone Available Help at Discharge: Family;Available PRN/intermittently;Other (Comment)(PCA 2-5 pm) Type of Home: House Home Access: Ramped entrance     Home Layout: One level     Bathroom Shower/Tub: Astronomer Accessibility: Yes   Home Equipment: Tub bench;Walker - 4 wheels;Cane - single point;Wheelchair - manual   Additional Comments: Home information from previous notes.       Prior Functioning/Environment Level of Independence: Needs assistance  Gait / Transfers Assistance Needed: Patient reports needing assistance to transfer to wheelchair ADL's / Homemaking Assistance Needed: Requires assistance for ADLs   Comments: Patient reports daughter helped her transfer to wheelchair and complete ADLs because she did not have an aide since recent d/c from SNF        OT Problem List: Decreased strength;Decreased activity tolerance;Impaired balance (sitting and/or standing);Decreased knowledge of use of DME or AE;Decreased knowledge of precautions;Obesity;Pain      OT Treatment/Interventions: Self-care/ADL training;Therapeutic exercise;Energy  conservation;DME and/or AE instruction;Therapeutic activities;Patient/family education;Balance training    OT Goals(Current goals can be found in the care plan section) Acute Rehab OT Goals Patient Stated Goal: to get to chair and eat breakfast OT Goal Formulation: With patient Time For Goal Achievement: 04/19/18 Potential to Achieve Goals: Good  OT Frequency: Min 2X/week   Barriers to D/C:            Co-evaluation PT/OT/SLP Co-Evaluation/Treatment: Yes Reason for Co-Treatment: Complexity of the patient's impairments (multi-system involvement);For patient/therapist safety;To address functional/ADL transfers   OT goals addressed during session: ADL's and self-care;Proper use of Adaptive equipment and DME;Strengthening/ROM      AM-PAC OT "6 Clicks" Daily Activity     Outcome Measure Help from another person eating meals?: None Help from another person taking care of personal grooming?: A Little Help from another person toileting, which includes using toliet, bedpan, or urinal?: A Lot Help from another person bathing (including washing, rinsing, drying)?: A Lot Help from another person to put on and taking off regular upper body clothing?: A Lot Help from another person to put on and taking off regular lower body clothing?: A Lot 6 Click Score: 15   End of Session Equipment Utilized During Treatment: Other (comment);Gait belt(stedy) Nurse Communication: Mobility status;Other (comment)(wound pads soiled (sacral) and coming loose (LUE))  Activity Tolerance: Patient tolerated treatment well;Patient limited by pain;Patient limited by fatigue Patient left: in chair;with call bell/phone within reach;with chair alarm set(sitting on pillow, awaiting geo mat)  OT  Visit Diagnosis: Unsteadiness on feet (R26.81);Other abnormalities of gait and mobility (R26.89);Muscle weakness (generalized) (M62.81);History of falling (Z91.81);Pain Pain - part of body: Ankle and joints of foot                 Time: 7741-4239 OT Time Calculation (min): 36 min Charges:  OT General Charges $OT Visit: 1 Visit OT Evaluation $OT Eval Moderate Complexity: Lawrence, OT Acute Rehabilitation Services Pager: [phone] Office: [phone]   Hortencia Pilar 04/05/2018, 11:12 AM

## 2018-04-05 NOTE — NC FL2 (Signed)
[place] LEVEL OF CARE SCREENING TOOL     IDENTIFICATION  Patient Name: Heather Petty Birthdate: 1950-06-14 Sex: female Admission Date (Current Location): 03/30/2018  [place] and [place] Number:  Kathleen Argue [id number] p Facility and Address:  [facility] [place], [place] [address], [place], [place] [address]      Provider Number: [id number]  Attending Physician Name and Address:  Jonetta Osgood, MD  Relative Name and Phone Number:  [name], daughter [phone]    Current Level of Care: Hospital Recommended Level of Care: [place] Prior Approval Number:    Date Approved/Denied:   PASRR Number: [id number]  Discharge Plan: SNF    Current Diagnoses: Patient Active Problem List   Diagnosis Date Noted  . Wound infection 04/03/2018  . Morbid obesity with BMI of 50.0-59.9, adult ([place]) 04/03/2018  . Elevated alkaline phosphatase level 04/03/2018  . Asthma exacerbation 03/14/2018  . Hypotension 03/14/2018  . Lactic acidosis 03/14/2018  . GERD (gastroesophageal reflux disease) 03/14/2018  . Acute renal failure superimposed on stage 3 chronic kidney disease ([place]) 03/14/2018  . Macrocytic anemia 03/14/2018  . Abnormal LFTs 03/14/2018  . Acute kidney injury ([place]) 02/28/2018  . Wound of sacral region 02/28/2018  . Hypothermia 02/28/2018  . Pressure injury of skin 02/06/2018  . Acute renal failure (ARF) ([place]) 02/05/2018  . Hyperlipidemia associated with type 2 diabetes mellitus ([place]) 02/05/2018  . Hyperkalemia 02/05/2018  . Dermatitis 02/05/2018  . Open back wound 02/05/2018  . Dysuria 02/05/2018  . Paranoid schizophrenia ([place])   . CAP (community acquired pneumonia) 12/17/2017  . AKI (acute kidney injury) ([place]) 12/17/2017  . Type II diabetes mellitus with renal manifestations ([place]) 12/17/2017  . HTN (hypertension) 12/17/2017  . Gout     Orientation RESPIRATION BLADDER Height & Weight     Self, Time, Situation,  Place  Normal Incontinent, External catheter Weight: 130 kg Height:  5\' 1"  (154.9 cm)  BEHAVIORAL SYMPTOMS/MOOD NEUROLOGICAL BOWEL NUTRITION STATUS      Continent Diet(Please see DC Summary)  AMBULATORY STATUS COMMUNICATION OF NEEDS Skin   Extensive Assist Verbally PU Stage and Appropriate Care(Stage II on knee; Stage III on buttocks;unstageable on sacrum;)   PU Stage 2 Dressing: Daily PU Stage 3 Dressing: QID                 Personal Care Assistance Level of Assistance  Bathing, Feeding, Dressing Bathing Assistance: Maximum assistance Feeding assistance: Independent Dressing Assistance: Maximum assistance     Functional Limitations Info  Sight, Hearing, Speech Sight Info: Impaired Hearing Info: Adequate Speech Info: Adequate    SPECIAL CARE FACTORS FREQUENCY  PT (By licensed PT), OT (By licensed OT)     PT Frequency: 5x/week OT Frequency: 3x/week            Contractures Contractures Info: Not present    Additional Factors Info  Code Status, Allergies, Insulin Sliding Scale, Psychotropic, Isolation Precautions Code Status Info: Full Allergies Info: NKA Psychotropic Info: Risperdal tablet  Insulin Sliding Scale Info: 3x daily with meals and at bedtime Isolation Precautions Info: VRE     Current Medications (04/05/2018):  This is the current hospital active medication list Current Facility-Administered Medications  Medication Dose Route Frequency Provider Last Rate Last Dose  . acetaminophen (TYLENOL) tablet 650 mg  650 mg Oral Q6H PRN Karmen Bongo, MD       Or  . acetaminophen (TYLENOL) suppository 650 mg  650 mg Rectal Q6H PRN Karmen Bongo, MD      .  albuterol (PROVENTIL) (2.5 MG/3ML) 0.083% nebulizer solution 2.5 mg  2.5 mg Nebulization Q4H PRN Karmen Bongo, MD      . allopurinol (ZYLOPRIM) tablet 300 mg  300 mg Oral Daily Karmen Bongo, MD   300 mg at 04/04/18 1023  . amLODipine (NORVASC) tablet 10 mg  10 mg Oral Daily Karmen Bongo, MD   10 mg  at 04/04/18 1036  . antiseptic oral rinse (BIOTENE) solution 15 mL  15 mL Mouth Rinse TID Karmen Bongo, MD   15 mL at 04/03/18 2255  . azelastine (ASTELIN) 0.1 % nasal spray 1 spray  1 spray Each Nare BID PRN Karmen Bongo, MD       And  . fluticasone (FLONASE) 50 MCG/ACT nasal spray 1 spray  1 spray Each Nare BID PRN Karmen Bongo, MD      . B-complex with vitamin C tablet 1 tablet  1 tablet Oral Daily Karmen Bongo, MD   1 tablet at 04/04/18 1025  . benazepril (LOTENSIN) tablet 20 mg  20 mg Oral Daily Karmen Bongo, MD   20 mg at 04/04/18 1025  . bisacodyl (DULCOLAX) EC tablet 5 mg  5 mg Oral Daily PRN Karmen Bongo, MD      . cephALEXin [facility]) capsule 500 mg  500 mg Oral Q8H Ghimire, Shanker M, MD      . colchicine tablet 0.6 mg  0.6 mg Oral BID Karmen Bongo, MD   0.6 mg at 04/04/18 2141  . collagenase (SANTYL) ointment   Topical Daily Ghimire, Henreitta Leber, MD      . doxepin (SINEQUAN) capsule 25 mg  25 mg Oral BID Karmen Bongo, MD   25 mg at 04/04/18 2141  . enoxaparin (LOVENOX) injection 65 mg  65 mg Subcutaneous Q24H Ghimire, Shanker M, MD      . famotidine (PEPCID) tablet 20 mg  20 mg Oral Ivery Quale, MD   20 mg at 04/04/18 2141  . feeding supplement (PRO-STAT SUGAR FREE 64) liquid 30 mL  30 mL Oral TID WC Karmen Bongo, MD   30 mL at 04/05/18 0836  . folic acid (FOLVITE) tablet 1 mg  1 mg Oral Daily Karmen Bongo, MD   1 mg at 04/04/18 1025  . furosemide (LASIX) tablet 20 mg  20 mg Oral Daily Karmen Bongo, MD   20 mg at 04/04/18 1023  . gabapentin (NEURONTIN) capsule 300 mg  300 mg Oral TID Karmen Bongo, MD   300 mg at 04/04/18 2142  . hydrocerin (EUCERIN) cream 1 application  1 application Topical BID Karmen Bongo, MD   1 application at 15/40/08 2143  . hydrOXYzine (ATARAX/VISTARIL) tablet 25 mg  25 mg Oral TID Karmen Bongo, MD   25 mg at 04/04/18 2142  . insulin aspart (novoLOG) injection 0-20 Units  0-20 Units Subcutaneous TID WC Karmen Bongo, MD   Stopped at 04/05/18 [phone]  . insulin aspart (novoLOG) injection 0-5 Units  0-5 Units Subcutaneous QHS Karmen Bongo, MD      . insulin aspart (novoLOG) injection 6 Units  6 Units Subcutaneous TID WC Karmen Bongo, MD   Stopped at 04/05/18 0830  . insulin glargine (LANTUS) injection 20 Units  20 Units Subcutaneous QHS Karmen Bongo, MD   20 Units at 04/04/18 2142  . liver oil-zinc oxide (DESITIN) 40 % ointment   Topical QID Karmen Bongo, MD      . loratadine (CLARITIN) tablet 10 mg  10 mg Oral Daily Karmen Bongo, MD   10 mg at  04/04/18 1024  . magnesium citrate solution 1 Bottle  1 Bottle Oral Once Karmen Bongo, MD   Stopped at 04/03/18 [phone]  . naproxen (NAPROSYN) tablet 500 mg  500 mg Oral BID WC Karmen Bongo, MD   500 mg at 04/05/18 9604  . nystatin (MYCOSTATIN/NYSTOP) topical powder   Topical BID Karmen Bongo, MD      . ondansetron [facility]) tablet 4 mg  4 mg Oral Q6H PRN Karmen Bongo, MD       Or  . ondansetron [facility]) injection 4 mg  4 mg Intravenous Q6H PRN Karmen Bongo, MD      . polyethylene glycol (MIRALAX / GLYCOLAX) packet 17 g  17 g Oral Daily PRN Karmen Bongo, MD   17 g at 04/01/18 1046  . pravastatin (PRAVACHOL) tablet 40 mg  40 mg Oral Daily Karmen Bongo, MD   40 mg at 04/04/18 1025  . risperiDONE (RISPERDAL) tablet 0.5 mg  0.5 mg Oral BID Karmen Bongo, MD   0.5 mg at 04/04/18 2142  . sodium phosphate (FLEET) 7-19 GM/118ML enema 1 enema  1 enema Rectal Once PRN Karmen Bongo, MD      . traMADol [facility]) tablet 50 mg  50 mg Oral QID PRN Karmen Bongo, MD         Discharge Medications: Please see discharge summary for a list of discharge medications.  Relevant Imaging Results:  Relevant Lab Results:   Additional Information SS# [place], [place]

## 2018-04-05 NOTE — Progress Notes (Signed)
Physical Therapy Treatment Patient Details Name: Heather Petty MRN: [id number] DOB: 11-24-1950 Today's Date: 04/05/2018    History of Present Illness Patient is 68 y/o female presenting to hospital due to inability to care for self at home. Patient recently d/c from SNF. PMH includes AKI, asthma, DMII, HTN, schizophrenia, morbid obesity, dermatitis, and chronic sacral wounds.    PT Comments    Pt presented in bed and alert. Pt agreed to attempt stedy transfer to recliner and light bed therex. Co-treat with Curt Bears (OT). Pt limited d/t pain and discomfort. Pt requires mod A +2 for mobility to EOB and powering stand to/from sit. Pt tolerated therex interventions well. Pt presented with stool incontinence and was cleaned by OT prior to transfer. Pt provided with extra pillow padding in recliner to make sitting more comfortable, as she stated she did not want to stay in chair long. Pt left with call bell/phone, chair alarm, and all other needs within reach. Pt current status makes d/c plan to SNF vs 24 hour supervision appropriate. Plan to progress pt with therex as tolerated and attempt more standing activities to improve mobility and decrease BLE pain.   Follow Up Recommendations  SNF;Supervision/Assistance - 24 hour     Equipment Recommendations  None recommended by PT    Recommendations for Other Services       Precautions / Restrictions Precautions Precautions: Fall Restrictions Weight Bearing Restrictions: No    Mobility  Bed Mobility Overal bed mobility: Needs Assistance Bed Mobility: Supine to Sit     Supine to sit: Mod assist;+2 for physical assistance     General bed mobility comments: Patient required modA +2 for trunk control and LE managment for all bed mobility. Patient required increased time and effort to sit EOB. Pt agreed to attempt stedy lift to transfer to chair  Transfers Overall transfer level: Needs assistance   Transfers: Sit to/from Stand Sit to  Stand: Mod assist;+2 physical assistance         General transfer comment: Assist to power-up to full stand as well as gain/maintain standing balance. Pt able to stand for limited time d/t complaints of increasing pain in BLE feet.   Ambulation/Gait             General Gait Details: Unable at this time.    Stairs             Wheelchair Mobility    Modified Rankin (Stroke Patients Only)       Balance Overall balance assessment: Needs assistance Sitting-balance support: No upper extremity supported Sitting balance-Leahy Scale: Good(limited time in seated d/t pain from wound) Sitting balance - Comments: Patient able to maintain sitting EOB without LOB   Standing balance support: Bilateral upper extremity supported;During functional activity Standing balance-Leahy Scale: Poor Standing balance comment: Reliant on UE support + stedy                             Cognition Arousal/Alertness: Awake/alert Behavior During Therapy: Anxious Overall Cognitive Status: Within Functional Limits for tasks assessed                                 General Comments: Answwering questions appropriately. No family present to confirm accuracy but overall matches with chart review.      Exercises General Exercises - Lower Extremity Ankle Circles/Pumps: AROM;10 reps;Both;Supine Heel Slides: AROM;10 reps;Both;Supine    General  Comments        Pertinent Vitals/Pain Pain Assessment: Faces Faces Pain Scale: Hurts even more Pain Location: bilateral feet Pain Descriptors / Indicators: Aching Pain Intervention(s): Monitored during session;Repositioned    Home Living Family/patient expects to be discharged to:: Skilled nursing facility Living Arrangements: Alone Available Help at Discharge: Family;Available PRN/intermittently;Other (Comment)(PCA 2-5 pm) Type of Home: House Home Access: Ramped entrance   Home Layout: One level Home Equipment: Tub  bench;Walker - 4 wheels;Cane - single point;Wheelchair - manual Additional Comments: Home information from previous notes.     Prior Function Level of Independence: Needs assistance  Gait / Transfers Assistance Needed: Patient reports needing assistance to transfer to wheelchair ADL's / Homemaking Assistance Needed: Requires assistance for ADLs Comments: Patient reports daughter helped her transfer to wheelchair and complete ADLs because she did not have an aide since recent d/c from SNF   PT Goals (current goals can now be found in the care plan section) Acute Rehab PT Goals Patient Stated Goal: to get to chair and eat breakfast PT Goal Formulation: With patient Potential to Achieve Goals: Fair    Frequency    Min 2X/week      PT Plan      Co-evaluation PT/OT/SLP Co-Evaluation/Treatment: Yes Reason for Co-Treatment: Complexity of the patient's impairments (multi-system involvement);For patient/therapist safety;To address functional/ADL transfers   OT goals addressed during session: ADL's and self-care;Proper use of Adaptive equipment and DME;Strengthening/ROM      AM-PAC PT "6 Clicks" Mobility   Outcome Measure  Help needed turning from your back to your side while in a flat bed without using bedrails?: A Lot Help needed moving from lying on your back to sitting on the side of a flat bed without using bedrails?: A Lot Help needed moving to and from a bed to a chair (including a wheelchair)?: A Lot Help needed standing up from a chair using your arms (e.g., wheelchair or bedside chair)?: A Lot Help needed to walk in hospital room?: Total Help needed climbing 3-5 steps with a railing? : Total 6 Click Score: 10    End of Session Equipment Utilized During Treatment: Gait belt Activity Tolerance: Patient limited by pain Patient left: in chair;with chair alarm set;with call bell/phone within reach(being served breakfast) Nurse Communication: Mobility status PT Visit  Diagnosis: Other abnormalities of gait and mobility (R26.89);Muscle weakness (generalized) (M62.81);Pain;History of falling (Z91.81) Pain - part of body: Leg     Time: 6812-7517 PT Time Calculation (min) (ACUTE ONLY): 42 min  Charges:  $Therapeutic Activity: 23-37 mins                     Maryelizabeth Kaufmann, SPTA   Maryelizabeth Kaufmann 04/05/2018, 12:45 PM

## 2018-04-05 NOTE — Progress Notes (Signed)
Patient now in agreement for SNF. SNF liaison (Tammy) processing Medicaid and will not be able to accept patient until they get approval.  Cedric Fishman LCSW [phone]

## 2018-04-05 NOTE — Discharge Summary (Addendum)
PATIENT DETAILS Name: Heather Petty Age: 68 y.o. Sex: female Date of Birth: 03/12/1950 MRN: [id number]. Admitting Physician: Karmen Bongo, MD [id number], Christean Grief, MD  Admit Date: 03/30/2018 Discharge date: 04/12/2018  Recommendations for Outpatient Follow-up:  1. Follow up with PCP in 1-2 weeks 2. Please obtain BMP/CBC in one week 3. Follow blood cultures until final   Admitted From:  Home with home health services  Disposition: SNF   Home Health: No  Equipment/Devices: None  Discharge Condition: Stable  CODE STATUS: FULL CODE  Diet recommendation:  Heart Healthy / Carb Modified   Brief Summary: See H&P, Labs, Consult and Test reports for all details in brief,Patient is a 68 y.o. female with history of morbid obesity, schizophrenia, hypertension, dyslipidemia, DM, bedbound status (nonambulatory for 10 years) presented to the ED on 2/22 with weakness and falls.  She was being observed in the ED while awaiting placement, she subsequently developed a fever and was then admitted to the hospitalist service.  See below for further details  Brief Hospital Course:  Fever: Concern for infection of the left arm wound-but this morning no purulence on my exam.  No other foci of infection apparent clinically.  Could have fever secondary to gout flare.  Now afebrile-managed with IV Ancef-we will transition to Keflex.  Blood cultures negative so far-please follow until final.  Keflex stop date is 04/18/2018.  May benefit from outpatient general surgery follow-up for wound evaluation.  Acute gouty flare involving left foot:  Markedly better-minimal tenderness-managed with naproxen-we will continue colchicine on discharge.  Insulin-dependent DM-2:  Mild hypoglycemic episode this morning-Lantus decreased to 7 units, continue SSI.  Monitor CBGs QA CHS.  Hypertension: Controlled-continue Norvasc and Lotensin  Dyslipidemia: Continue statin  Peripheral neuropathy: Likely  related to diabetes, continue Neurontin  Schizophrenia: Appears to be stable-continue risperidone and doxepin  Chronically elevated alkaline phosphatase: Suspect secondary to bedbound status/osseous issue rather than liver issue.  Left arm chronic full-thickness wound, unstageable sacral pressure injury, type II full-thickness wound to bilateral buttocks: All present prior to admission-appreciate wound care evaluation-see below for wound care instructions  Debility/deconditioning/bedbound status for past 10 years  Morbid obesity   Procedures/Studies: None  Discharge Diagnoses:  Principal Problem:   Wound infection Active Problems:   Gout   Type II diabetes mellitus with renal manifestations (HCC)   HTN (hypertension)   Paranoid schizophrenia ([place])   Hyperlipidemia associated with type 2 diabetes mellitus ([place])   Wound of sacral region   Morbid obesity with BMI of 50.0-59.9, adult (HCC)   Elevated alkaline phosphatase level   Discharge Instructions:  Activity:  As tolerated with Full fall precautions use walker/cane & assistance as needed   Discharge Instructions    Call MD for:  redness, tenderness, or signs of infection (pain, swelling, redness, odor or green/yellow discharge around incision site)   Complete by:  As directed    Diet - low sodium heart healthy   Complete by:  As directed    Diet Carb Modified   Complete by:  As directed    Discharge instructions   Complete by:  As directed    Check CBGs before meals and at bedtime  Follow with Primary MD  [name], MD 1 week  Please get a complete blood count and chemistry panel checked by your Primary MD at your next visit, and again as instructed by your Primary MD.  Get Medicines reviewed and adjusted: Please take all your medications with you for your next visit with your  Primary MD  Laboratory/radiological data: Please request your Primary MD to go over all hospital tests and  procedure/radiological results at the follow up, please ask your Primary MD to get all Hospital records sent to his/her office.  In some cases, they will be blood work, cultures and biopsy results pending at the time of your discharge. Please request that your primary care M.D. follows up on these results.  Also Note the following: If you experience worsening of your admission symptoms, develop shortness of breath, life threatening emergency, suicidal or homicidal thoughts you must seek medical attention immediately by calling 911 or calling your MD immediately  if symptoms less severe.  You must read complete instructions/literature along with all the possible adverse reactions/side effects for all the Medicines you take and that have been prescribed to you. Take any new Medicines after you have completely understood and accpet all the possible adverse reactions/side effects.   Do not drive when taking Pain medications or sleeping medications (Benzodaizepines)  Do not take more than prescribed Pain, Sleep and Anxiety Medications. It is not advisable to combine anxiety,sleep and pain medications without talking with your primary care practitioner  Special Instructions: If you have smoked or chewed Tobacco  in the last 2 yrs please stop smoking, stop any regular Alcohol  and or any Recreational drug use.  Wear Seat belts while driving.  Please note: You were cared for by a hospitalist during your hospital stay. Once you are discharged, your primary care physician will handle any further medical issues. Please note that NO REFILLS for any discharge medications will be authorized once you are discharged, as it is imperative that you return to your primary care physician (or establish a relationship with a primary care physician if you do not have one) for your post hospital discharge needs so that they can reassess your need for medications and monitor your lab values.   Discharge wound care:    Complete by:  As directed    1.L arm, chronic full thickness wound: Apply Santyl nickel thick layer, cover with moistened gauze and foam dressing daily.   2.Sacral wound: Apply Santyl nickel thick layer, pack with moistened gauze using Q-tip to fill defect, cover with ABD pad and tape.   3.Bilateral buttocks:  Apply foam dressing, change q3 days and prn soiling.  It will be difficult to heal areas if patient remains incontinent keeping areas moist. Air mattress ordered to reduce pressure and increase air flow   Increase activity slowly   Complete by:  As directed      Allergies as of 04/12/2018   No Known Allergies     Medication List    STOP taking these medications   glipiZIDE 5 MG 24 hr tablet Commonly known as:  GLUCOTROL XL     TAKE these medications   acetaminophen 325 MG tablet Commonly known as:  TYLENOL Take 650 mg by mouth 4 (four) times daily as needed for headache (pain).   allopurinol 300 MG tablet Commonly known as:  ZYLOPRIM Take 300 mg by mouth daily.   amLODipine 10 MG tablet Commonly known as:  NORVASC Take 10 mg by mouth daily.   antiseptic oral rinse Liqd 15 mLs by Mouth Rinse route 3 (three) times daily. Swish and spit   b complex vitamins tablet Take 1 tablet by mouth daily.   benazepril 20 MG tablet Commonly known as:  LOTENSIN Take 20 mg by mouth daily.   bisacodyl 5 MG EC tablet Commonly known  as:  DULCOLAX Take 1 tablet (5 mg total) by mouth daily as needed for moderate constipation. What changed:  when to take this   cephALEXin 500 MG capsule Commonly known as:  KEFLEX Take 1 capsule (500 mg total) by mouth every 8 (eight) hours for 5 days.   cetirizine 10 MG tablet Commonly known as:  ZYRTEC Take 10 mg by mouth daily.   colchicine 0.6 MG tablet Take 1 tablet (0.6 mg total) by mouth 2 (two) times daily.   collagenase ointment Commonly known as:  SANTYL Apply topically daily. What changed:    how much to take  when to take  this  additional instructions   cyanocobalamin 1000 MCG/ML injection Commonly known as:  (VITAMIN B-12) Inject 1 mL (1,000 mcg total) into the muscle once a week. What changed:  when to take this   doxepin 25 MG capsule Commonly known as:  SINEQUAN Take 25 mg by mouth 2 (two) times daily.   Dymista 137-50 MCG/ACT Susp Generic drug:  Azelastine-Fluticasone Place 1 spray into both nostrils 2 (two) times daily as needed (for allergies).   Eucerin Eczema Relief 1 % Crea Generic drug:  Colloidal Oatmeal Apply 1 application topically 2 (two) times daily.   famotidine 20 MG tablet Commonly known as:  PEPCID Take 20 mg by mouth at bedtime.   feeding supplement (PRO-STAT SUGAR FREE 64) Liqd Take 30 mLs by mouth 3 (three) times daily with meals.   folic acid 1 MG tablet Commonly known as:  FOLVITE Take 1 mg by mouth daily.   furosemide 20 MG tablet Commonly known as:  LASIX Take 20 mg by mouth daily.   gabapentin 300 MG capsule Commonly known as:  NEURONTIN Take 300 mg by mouth 3 (three) times daily. What changed:  Another medication with the same name was removed. Continue taking this medication, and follow the directions you see here.   hydrocortisone 2.5 % cream Apply 1 application topically See admin instructions. Apply topically to rash on face twice daily   hydrOXYzine 25 MG tablet Commonly known as:  ATARAX/VISTARIL Take 25 mg by mouth 3 (three) times daily.   insulin aspart 100 UNIT/ML injection Commonly known as:  NovoLOG Take subcu QA CHS, 140-199 - 2 units, 200-250 - 6 units, 251-299 - 8 units,  300-349 - 10 units,  350 or above 14 units. What changed:    how much to take  how to take this  when to take this  additional instructions   insulin aspart 100 UNIT/ML injection Commonly known as:  novoLOG Inject 0-15 Units into the skin 3 (three) times daily with meals. What changed:  You were already taking a medication with the same name, and this  prescription was added. Make sure you understand how and when to take each.   insulin glargine 100 UNIT/ML injection Commonly known as:  LANTUS Inject 0.07 mLs (7 Units total) into the skin at bedtime. What changed:  You were already taking a medication with the same name, and this prescription was added. Make sure you understand how and when to take each.   insulin glargine 100 UNIT/ML injection Commonly known as:  LANTUS Inject 0.07 mLs (7 Units total) into the skin daily. Start taking on:  April 13, 2018 What changed:    how much to take  when to take this   ipratropium-albuterol 0.5-2.5 (3) MG/3ML Soln Commonly known as:  DUONEB Take 3 mLs by nebulization every 6 (six) hours as needed (for shortness  of breath or wheezing).   nystatin powder Commonly known as:  MYCOSTATIN/NYSTOP Apply topically See admin instructions. Apply topically twice daily -  between folds, beneath breasts, bilateral axillae, and under pannus   polyethylene glycol packet Commonly known as:  MIRALAX / GLYCOLAX Take 17 g by mouth 2 (two) times daily. What changed:    when to take this  reasons to take this   pravastatin 40 MG tablet Commonly known as:  PRAVACHOL Take 40 mg by mouth daily.   risperiDONE 0.5 MG tablet Commonly known as:  RISPERDAL Take 1 tablet (0.5 mg total) by mouth 2 (two) times daily.   silver sulfADIAZINE 1 % cream Commonly known as:  SILVADENE Apply topically 2 (two) times daily.   THERA-M MULTIPLE VITAMINS PO Take 1 tablet by mouth daily.   traMADol 50 MG tablet Commonly known as:  ULTRAM Take 50 mg by mouth 4 (four) times daily as needed (pain).   triamcinolone 0.147 MG/GM topical spray Commonly known as:  KENALOG Apply 1 spray topically See admin instructions. Apply topically to head in the direction of hair growth daily at bedtime   Ventolin HFA 108 (90 Base) MCG/ACT inhaler Generic drug:  albuterol Inhale 2 puffs into the lungs every 4 (four) hours as needed  for wheezing or shortness of breath.   vitamin B-6 25 MG tablet Commonly known as:  pyridOXINE Take 25 mg by mouth daily.   vitamin C 500 MG tablet Commonly known as:  ASCORBIC ACID Take 500 mg by mouth 2 (two) times daily.   Vitamin D (Ergocalciferol) 1.25 MG (50000 UT) Caps capsule Commonly known as:  DRISDOL Take 50,000 Units by mouth every Monday.   ZINC-220 PO Take 220 mg by mouth at bedtime.            Discharge Care Instructions  (From admission, onward)         Start     Ordered   04/05/18 0000  Discharge wound care:    Comments:  1.L arm, chronic full thickness wound: Apply Santyl nickel thick layer, cover with moistened gauze and foam dressing daily.   2.Sacral wound: Apply Santyl nickel thick layer, pack with moistened gauze using Q-tip to fill defect, cover with ABD pad and tape.   3.Bilateral buttocks:  Apply foam dressing, change q3 days and prn soiling.  It will be difficult to heal areas if patient remains incontinent keeping areas moist. Air mattress ordered to reduce pressure and increase air flow   04/05/18 1014         Follow-up Information    [name], MD. Schedule an appointment as soon as possible for a visit in 1 week(s).   Specialty:  Internal Medicine Contact information: [place] [id number] [phone]          No Known Allergies  Consultations:   None   Other Procedures/Studies: Dg Chest 2 View  Result Date: 03/30/2018 CLINICAL DATA:  Fall with fatigue. EXAM: CHEST - 2 VIEW COMPARISON:  03/18/2018 FINDINGS: 1835 hours. Low volumes. The cardio pericardial silhouette is enlarged. The lungs are clear without focal pneumonia, edema, pneumothorax or pleural effusion. The visualized bony structures of the thorax are intact. Degenerative changes again noted left shoulder. Telemetry leads overlie the chest. IMPRESSION: Interval improvement in basilar aeration. Low volumes without acute cardiopulmonary  findings on today's study. Electronically Signed   By: Misty Stanley M.D.   On: 03/30/2018 18:54   Ct Head Wo Contrast  Result Date: 03/30/2018  CLINICAL DATA:  Fall, weakness, fatigue EXAM: CT HEAD WITHOUT CONTRAST TECHNIQUE: Contiguous axial images were obtained from the base of the skull through the vertex without intravenous contrast. COMPARISON:  02/28/2018 FINDINGS: Brain: No acute intracranial abnormality. Specifically, no hemorrhage, hydrocephalus, mass lesion, acute infarction, or significant intracranial injury. Vascular: No hyperdense vessel or unexpected calcification. Skull: No acute calvarial abnormality. Sinuses/Orbits: Visualized paranasal sinuses and mastoids clear. Orbital soft tissues unremarkable. Other: None IMPRESSION: No acute intracranial abnormality. Electronically Signed   By: Rolm Baptise M.D.   On: 03/30/2018 18:45   Dg Chest Portable 1 View  Result Date: 04/03/2018 CLINICAL DATA:  Fevers EXAM: PORTABLE CHEST 1 VIEW COMPARISON:  03/30/2018 FINDINGS: Cardiac shadow is stable. Aortic calcifications are again seen. The lungs are well aerated bilaterally. Mild right basilar atelectasis is noted. No sizable effusion is seen. No acute bony abnormality is noted. IMPRESSION: Mild right basilar atelectasis. Electronically Signed   By: Inez Catalina M.D.   On: 04/03/2018 09:28   Dg Chest Port 1 View  Result Date: 03/18/2018 CLINICAL DATA:  Wheezing EXAM: PORTABLE CHEST 1 VIEW COMPARISON:  03/14/2018 FINDINGS: Cardiomegaly. Vascular congestion. Right lower lobe airspace opacity with small to moderate right effusion. No confluent opacity or effusion on the left. No acute bony abnormality. IMPRESSION: Cardiomegaly, vascular congestion. Small to moderate right pleural effusion with right lower lobe atelectasis or pneumonia. Electronically Signed   By: Rolm Baptise M.D.   On: 03/18/2018 20:14   Dg Chest Port 1 View  Result Date: 03/14/2018 CLINICAL DATA:  Shortness of breath EXAM: PORTABLE  CHEST 1 VIEW COMPARISON:  03/05/2018 FINDINGS: There is bibasilar atelectasis. There is elevation of the right diaphragm. There is no focal consolidation. There is no pleural effusion or pneumothorax. The heart and mediastinal contours are unremarkable. The osseous structures are unremarkable. IMPRESSION: No active disease. Electronically Signed   By: Kathreen Devoid   On: 03/14/2018 20:25   US Abdomen Limited Ruq  Result Date: 03/16/2018 CLINICAL DATA:  Elevated LFTs EXAM: ULTRASOUND ABDOMEN LIMITED RIGHT UPPER QUADRANT COMPARISON:  None. FINDINGS: Gallbladder: No gallstones or wall thickening visualized. No sonographic Murphy sign noted by sonographer. Common bile duct: Diameter: 4 mm Liver: Increased in echogenicity. No focal lesion identified portal vein is patent on color Doppler imaging with normal direction of blood flow towards the liver. IMPRESSION: Hepatic steatosis. No cholelithiasis or sonographic evidence for acute cholecystitis. Electronically Signed   By: Lovey Newcomer M.D.   On: 03/16/2018 20:25     TODAY-DAY OF DISCHARGE:  Subjective:   [name] today has no headache,no chest abdominal pain,no new weakness tingling or numbness, feels much better wants to go home today.   Objective:   Blood pressure (!) 123/55, pulse 79, temperature 98.2 F (36.8 C), temperature source Oral, resp. rate 16, height 5' 1"  (1.549 m), weight 130 kg, SpO2 96 %.  Intake/Output Summary (Last 24 hours) at 04/12/2018 1033 Last data filed at 04/12/2018 0517 Gross per 24 hour  Intake 640 ml  Output 1300 ml  Net -660 ml   Filed Weights   03/30/18 1756  Weight: 130 kg    Exam:  Awake Alert, Oriented *3, No new F.N deficits, Normal affect [place].AT,PERRAL Supple Neck,No JVD, No cervical lymphadenopathy appriciated.  Symmetrical Chest wall movement, Good air movement bilaterally, CTAB RRR,No Gallops,Rubs or new Murmurs, No Parasternal Heave +ve B.Sounds, Abd Soft, Non tender, No organomegaly  appriciated, No rebound -guarding or rigidity. No Cyanosis, Clubbing or edema, L shoulder skin wound under  bandage with stable surrounding skin.  No signs of surrounding cellulitis.   PERTINENT RADIOLOGIC STUDIES: Dg Chest 2 View  Result Date: 03/30/2018 CLINICAL DATA:  Fall with fatigue. EXAM: CHEST - 2 VIEW COMPARISON:  03/18/2018 FINDINGS: 1835 hours. Low volumes. The cardio pericardial silhouette is enlarged. The lungs are clear without focal pneumonia, edema, pneumothorax or pleural effusion. The visualized bony structures of the thorax are intact. Degenerative changes again noted left shoulder. Telemetry leads overlie the chest. IMPRESSION: Interval improvement in basilar aeration. Low volumes without acute cardiopulmonary findings on today's study. Electronically Signed   By: Misty Stanley M.D.   On: 03/30/2018 18:54   Ct Head Wo Contrast  Result Date: 03/30/2018 CLINICAL DATA:  Fall, weakness, fatigue EXAM: CT HEAD WITHOUT CONTRAST TECHNIQUE: Contiguous axial images were obtained from the base of the skull through the vertex without intravenous contrast. COMPARISON:  02/28/2018 FINDINGS: Brain: No acute intracranial abnormality. Specifically, no hemorrhage, hydrocephalus, mass lesion, acute infarction, or significant intracranial injury. Vascular: No hyperdense vessel or unexpected calcification. Skull: No acute calvarial abnormality. Sinuses/Orbits: Visualized paranasal sinuses and mastoids clear. Orbital soft tissues unremarkable. Other: None IMPRESSION: No acute intracranial abnormality. Electronically Signed   By: Rolm Baptise M.D.   On: 03/30/2018 18:45   Dg Chest Portable 1 View  Result Date: 04/03/2018 CLINICAL DATA:  Fevers EXAM: PORTABLE CHEST 1 VIEW COMPARISON:  03/30/2018 FINDINGS: Cardiac shadow is stable. Aortic calcifications are again seen. The lungs are well aerated bilaterally. Mild right basilar atelectasis is noted. No sizable effusion is seen. No acute bony abnormality is  noted. IMPRESSION: Mild right basilar atelectasis. Electronically Signed   By: Inez Catalina M.D.   On: 04/03/2018 09:28   Dg Chest Port 1 View  Result Date: 03/18/2018 CLINICAL DATA:  Wheezing EXAM: PORTABLE CHEST 1 VIEW COMPARISON:  03/14/2018 FINDINGS: Cardiomegaly. Vascular congestion. Right lower lobe airspace opacity with small to moderate right effusion. No confluent opacity or effusion on the left. No acute bony abnormality. IMPRESSION: Cardiomegaly, vascular congestion. Small to moderate right pleural effusion with right lower lobe atelectasis or pneumonia. Electronically Signed   By: Rolm Baptise M.D.   On: 03/18/2018 20:14   Dg Chest Port 1 View  Result Date: 03/14/2018 CLINICAL DATA:  Shortness of breath EXAM: PORTABLE CHEST 1 VIEW COMPARISON:  03/05/2018 FINDINGS: There is bibasilar atelectasis. There is elevation of the right diaphragm. There is no focal consolidation. There is no pleural effusion or pneumothorax. The heart and mediastinal contours are unremarkable. The osseous structures are unremarkable. IMPRESSION: No active disease. Electronically Signed   By: Kathreen Devoid   On: 03/14/2018 20:25   US Abdomen Limited Ruq  Result Date: 03/16/2018 CLINICAL DATA:  Elevated LFTs EXAM: ULTRASOUND ABDOMEN LIMITED RIGHT UPPER QUADRANT COMPARISON:  None. FINDINGS: Gallbladder: No gallstones or wall thickening visualized. No sonographic Murphy sign noted by sonographer. Common bile duct: Diameter: 4 mm Liver: Increased in echogenicity. No focal lesion identified portal vein is patent on color Doppler imaging with normal direction of blood flow towards the liver. IMPRESSION: Hepatic steatosis. No cholelithiasis or sonographic evidence for acute cholecystitis. Electronically Signed   By: Lovey Newcomer M.D.   On: 03/16/2018 20:25     PERTINENT LAB RESULTS: CBC: No results for input(s): WBC, HGB, HCT, PLT in the last 72 hours. CMET CMP     Component Value Date/Time   NA 140 04/09/2018 0438   K  3.8 04/09/2018 0438   CL 107 04/09/2018 0438   CO2 23 04/09/2018 0438  GLUCOSE 68 (L) 04/09/2018 0438   BUN 19 04/09/2018 0438   CREATININE 0.66 04/09/2018 0438   CALCIUM 8.6 (L) 04/09/2018 0438   PROT 5.3 (L) 04/03/2018 1020   ALBUMIN 2.1 (L) 04/03/2018 1020   AST 37 04/03/2018 1020   ALT 41 04/03/2018 1020   ALKPHOS 448 (H) 04/03/2018 1020   BILITOT 1.2 04/03/2018 1020   GFRNONAA >60 04/09/2018 0438   GFRAA >60 04/09/2018 0438    GFR Estimated Creatinine Clearance: 84.6 mL/min (by C-G formula based on SCr of 0.66 mg/dL). No results for input(s): LIPASE, AMYLASE in the last 72 hours. No results for input(s): CKTOTAL, CKMB, CKMBINDEX, TROPONINI in the last 72 hours. Invalid input(s): POCBNP No results for input(s): DDIMER in the last 72 hours. No results for input(s): HGBA1C in the last 72 hours. No results for input(s): CHOL, HDL, LDLCALC, TRIG, CHOLHDL, LDLDIRECT in the last 72 hours. No results for input(s): TSH, T4TOTAL, T3FREE, THYROIDAB in the last 72 hours.  Invalid input(s): FREET3 No results for input(s): VITAMINB12, FOLATE, FERRITIN, TIBC, IRON, RETICCTPCT in the last 72 hours. Coags: No results for input(s): INR in the last 72 hours.  Invalid input(s): PT Microbiology: Recent Results (from the past 240 hour(s))  Culture, blood (routine x 2)     Status: None   Collection Time: 04/03/18 10:20 AM  Result Value Ref Range Status   Specimen Description BLOOD LEFT WRIST  Final   Special Requests   Final    BOTTLES DRAWN AEROBIC AND ANAEROBIC Blood Culture adequate volume Performed at [facility], [address], [place], [place] [address]    Culture NO GROWTH 5 DAYS  Final   Report Status 04/08/2018 FINAL  Final  Culture, blood (routine x 2)     Status: None   Collection Time: 04/03/18 10:22 AM  Result Value Ref Range Status   Specimen Description BLOOD RIGHT WRIST  Final   Special Requests   Final    BOTTLES DRAWN AEROBIC AND ANAEROBIC Blood Culture  results may not be optimal due to an inadequate volume of blood received in culture bottles Performed at [facility], [place] [address], [place], [place] [address]    Culture NO GROWTH 5 DAYS  Final   Report Status 04/08/2018 FINAL  Final    FURTHER DISCHARGE INSTRUCTIONS:  Get Medicines reviewed and adjusted: Please take all your medications with you for your next visit with your Primary MD  Laboratory/radiological data: Please request your Primary MD to go over all hospital tests and procedure/radiological results at the follow up, please ask your Primary MD to get all Hospital records sent to his/her office.  In some cases, they will be blood work, cultures and biopsy results pending at the time of your discharge. Please request that your primary care M.D. goes through all the records of your hospital data and follows up on these results.  Also Note the following: If you experience worsening of your admission symptoms, develop shortness of breath, life threatening emergency, suicidal or homicidal thoughts you must seek medical attention immediately by calling 911 or calling your MD immediately  if symptoms less severe.  You must read complete instructions/literature along with all the possible adverse reactions/side effects for all the Medicines you take and that have been prescribed to you. Take any new Medicines after you have completely understood and accpet all the possible adverse reactions/side effects.   Do not drive when taking Pain medications or sleeping medications (Benzodaizepines)  Do not  take more than prescribed Pain, Sleep and Anxiety Medications. It is not advisable to combine anxiety,sleep and pain medications without talking with your primary care practitioner  Special Instructions: If you have smoked or chewed Tobacco  in the last 2 yrs please stop smoking, stop any regular Alcohol  and or any Recreational drug use.  Wear Seat belts while driving.  Please  note: You were cared for by a hospitalist during your hospital stay. Once you are discharged, your primary care physician will handle any further medical issues. Please note that NO REFILLS for any discharge medications will be authorized once you are discharged, as it is imperative that you return to your primary care physician (or establish a relationship with a primary care physician if you do not have one) for your post hospital discharge needs so that they can reassess your need for medications and monitor your lab values.  Total Time spent coordinating discharge including counseling, education and face to face time equals 35 minutes.  Signed: Lala Lund 04/12/2018 10:33 AM

## 2018-04-06 DIAGNOSIS — L089 Local infection of the skin and subcutaneous tissue, unspecified: Secondary | ICD-10-CM | POA: Diagnosis not present

## 2018-04-06 DIAGNOSIS — T148XXA Other injury of unspecified body region, initial encounter: Secondary | ICD-10-CM | POA: Diagnosis not present

## 2018-04-06 LAB — GLUCOSE, CAPILLARY
GLUCOSE-CAPILLARY: 115 mg/dL — AB (ref 70–99)
Glucose-Capillary: 42 mg/dL — CL (ref 70–99)
Glucose-Capillary: 59 mg/dL — ABNORMAL LOW (ref 70–99)
Glucose-Capillary: 88 mg/dL (ref 70–99)
Glucose-Capillary: 85 mg/dL (ref 70–99)

## 2018-04-06 LAB — BASIC METABOLIC PANEL
Anion gap: 5 (ref 5–15)
BUN: 22 mg/dL (ref 8–23)
CO2: 25 mmol/L (ref 22–32)
Calcium: 7.9 mg/dL — ABNORMAL LOW (ref 8.9–10.3)
Chloride: 110 mmol/L (ref 98–111)
Creatinine, Ser: 0.7 mg/dL (ref 0.44–1.00)
GFR calc Af Amer: >60 mL/min (ref >60)
GFR calc non Af Amer: >60 mL/min (ref >60)
Glucose, Bld: 75 mg/dL (ref 70–99)
Potassium: 3.6 mmol/L (ref 3.5–5.1)
Sodium: 140 mmol/L (ref 135–145)

## 2018-04-06 MED ORDER — INSULIN ASPART 100 UNIT/ML ~~LOC~~ SOLN
0.0000 [IU] | Freq: Three times a day (TID) | SUBCUTANEOUS | Status: DC
  Administered 2018-04-07: 2 [IU] via SUBCUTANEOUS
  Administered 2018-04-07 – 2018-04-08 (×3): 3 [IU] via SUBCUTANEOUS

## 2018-04-06 MED ORDER — INSULIN GLARGINE 100 UNIT/ML ~~LOC~~ SOLN
15.0000 [IU] | Freq: Every day | SUBCUTANEOUS | Status: DC
  Administered 2018-04-06: 15 [IU] via SUBCUTANEOUS
  Filled 2018-04-06: qty 0.15

## 2018-04-06 NOTE — Progress Notes (Signed)
PT CBG after 15G carbs 59. Pt remains oriented and asymptomatic. Given additional carbs (2 OJ and graham crackers). F/U CBG 88. Dr. Sloan Leiter notified of hypoglycemic event.

## 2018-04-06 NOTE — Progress Notes (Signed)
PROGRESS NOTE        PATIENT DETAILS Name: Heather Petty Age: 68 y.o. Sex: female Date of Birth: [birth year] Admit Date: 03/30/2018 Admitting Physician Karmen Bongo, MD AYO:[id number], Christean Grief, MD  Brief Narrative: Patient is a 68 y.o. female with history of morbid obesity, schizophrenia, hypertension, dyslipidemia, DM, bedbound status (nonambulatory for 10 years) presented to the ED on 2/22 with weakness and falls.  She was being observed in the ED while awaiting placement, she subsequently developed a fever and was then admitted to the hospitalist service.  See below for further details  Subjective: No major issues overnight-lying comfortably in bed.  Assessment/Plan: Fever: Concern for infection of the left arm wound-but this morning no purulence on my exam. No other foci of infection apparent clinically. Could have fever secondary to gout flare.  Now afebrile-managed with IV Ancef-but has been transitioned to Keflex.  Blood cultures negative so far-please follow until final.    Acute gouty flare involving left foot: Markedly better-minimal tenderness-managed with naproxen-we will continue colchicine   Insulin-dependent DM-2:  Hypoglycemic episode this morning-decrease Lantus to 15 units, change SSI to sensitive scale.  Follow and optimize  Hypertension: Controlled-continue Norvasc and Lotensin  Dyslipidemia:Continue statin  Peripheral neuropathy:Likely related to diabetes, continue Neurontin  Schizophrenia:Appears to be stable-continue risperidone and doxepin  Chronically elevated alkaline phosphatase:Suspect secondary to bedbound status/osseous issue rather than liver issue.  Left arm chronic full-thickness wound, unstageable sacral pressure injury, type II full-thickness wound to bilateral buttocks: All present prior to admission-appreciate wound care evaluation  Debility/deconditioning/bedbound status for past 10 years  Morbid  obesity  DVT Prophylaxis: Prophylactic Lovenox   Code Status: Full code   Family Communication: None at bedside  Disposition Plan: Remain inpatient-SNF on discharge-awaiting insurance authorization  Antimicrobial agents: Anti-infectives (From admission, onward)   Start     Dose/Rate Route Frequency Ordered Stop   04/05/18 0945  cephALEXin (KEFLEX) capsule 500 mg     500 mg Oral Every 8 hours 04/05/18 0943     04/05/18 0000  cephALEXin (KEFLEX) 500 MG capsule     500 mg Oral Every 8 hours 04/05/18 1014 04/10/18 2359   04/03/18 1900  ceFAZolin (ANCEF) IVPB 2g/100 mL premix  Status:  Discontinued     2 g 200 mL/hr over 30 Minutes Intravenous Every 8 hours 04/03/18 1136 04/05/18 0943   04/03/18 1045  ceFAZolin (ANCEF) IVPB 2g/100 mL premix     2 g 200 mL/hr over 30 Minutes Intravenous  Once 04/03/18 1042 04/03/18 1128      Procedures: None  CONSULTS:  None  Time spent: 15- minutes-Greater than 50% of this time was spent in counseling, explanation of diagnosis, planning of further management, and coordination of care.  MEDICATIONS: Scheduled Meds: . allopurinol  300 mg Oral Daily  . amLODipine  10 mg Oral Daily  . antiseptic oral rinse  15 mL Mouth Rinse TID  . B-complex with vitamin C  1 tablet Oral Daily  . benazepril  20 mg Oral Daily  . cephALEXin  500 mg Oral Q8H  . collagenase   Topical Daily  . doxepin  25 mg Oral BID  . enoxaparin (LOVENOX) injection  65 mg Subcutaneous Q24H  . famotidine  20 mg Oral QHS  . feeding supplement (PRO-STAT SUGAR FREE 64)  30 mL Oral TID WC  . folic acid  1 mg  Oral Daily  . furosemide  20 mg Oral Daily  . gabapentin  300 mg Oral TID  . hydrocerin  1 application Topical BID  . hydrOXYzine  25 mg Oral TID  . insulin aspart  0-15 Units Subcutaneous TID WC  . insulin glargine  15 Units Subcutaneous QHS  . liver oil-zinc oxide   Topical QID  . loratadine  10 mg Oral Daily  . magnesium citrate  1 Bottle Oral Once  . nystatin    Topical BID  . pravastatin  40 mg Oral Daily  . risperiDONE  0.5 mg Oral BID   Continuous Infusions:  PRN Meds:.acetaminophen **OR** acetaminophen, albuterol, azelastine **AND** fluticasone, bisacodyl, ondansetron **OR** ondansetron (ZOFRAN) IV, polyethylene glycol, sodium phosphate, traMADol   PHYSICAL EXAM: Vital signs: Vitals:   04/05/18 0543 04/05/18 1419 04/05/18 2136 04/06/18 0543  BP: (!) 110/53 (!) 114/54 129/61 101/70  Pulse: 68 78 87 76  Resp: 18 15    Temp: 97.6 F (36.4 C) 98.3 F (36.8 C) 97.9 F (36.6 C) (!) 97.4 F (36.3 C)  TempSrc: Oral Oral Oral Oral  SpO2: 95% 98% 99% 100%  Weight:      Height:       Filed Weights   03/30/18 1756  Weight: 130 kg   Body mass index is 54.15 kg/m.   General appearance :Awake, alert, not in any distress. Speech Clear.  Eyes:Pink conjunctiva HEENT: Atraumatic and Normocephalic Neck: supple, no JVD. No cervical lymphadenopathy.  Resp:Good air entry bilaterally, no added sounds  CVS: S1 S2 regular, no murmurs.  GI: Bowel sounds present, Non tender and not distended with no gaurding, rigidity or rebound.No organomegaly Extremities: B/L Lower Ext shows no edema, both legs are warm to touch Neurology: Generalized weakness-but nonfocal Musculoskeletal:No digital cyanosis Skin:No Rash, warm and dry  I have personally reviewed following labs and imaging studies  LABORATORY DATA: CBC: Recent Labs  Lab 03/30/18 1858 03/30/18 1906 04/03/18 1020 04/04/18 0429  WBC 8.7  --  9.1 5.8  NEUTROABS 6.8  --  6.7  --   HGB 10.8* 11.2* 10.5* 9.3*  HCT 36.3 33.0* 33.9* 29.7*  MCV 102.0*  --  98.0 96.7  PLT 140*  --  133* 132*    Basic Metabolic Panel: Recent Labs  Lab 03/30/18 1858 03/30/18 1906 04/03/18 1020 04/04/18 0429 04/05/18 0407 04/06/18 0356  NA 139 137 138 139 139 140  K 3.2* 3.3* 3.5 3.3* 3.8 3.6  CL 103  --  103 104 106 110  CO2 21*  --  25 25 25 25   GLUCOSE 342*  --  165* 139* 119* 75  BUN 16  --  11 15  19 22   CREATININE 1.07*  --  0.80 1.00 0.83 0.70  CALCIUM 8.5*  --  8.3* 7.7* 7.6* 7.9*    GFR: Estimated Creatinine Clearance: 84.6 mL/min (by C-G formula based on SCr of 0.7 mg/dL).  Liver Function Tests: Recent Labs  Lab 04/03/18 1020  AST 37  ALT 41  ALKPHOS 448*  BILITOT 1.2  PROT 5.3*  ALBUMIN 2.1*   No results for input(s): LIPASE, AMYLASE in the last 168 hours. No results for input(s): AMMONIA in the last 168 hours.  Coagulation Profile: No results for input(s): INR, PROTIME in the last 168 hours.  Cardiac Enzymes: No results for input(s): CKTOTAL, CKMB, CKMBINDEX, TROPONINI in the last 168 hours.  BNP (last 3 results) No results for input(s): PROBNP in the last 8760 hours.  HbA1C: No results for  input(s): HGBA1C in the last 72 hours.  CBG: Recent Labs  Lab 04/05/18 1327 04/05/18 1649 04/05/18 2137 04/06/18 0808 04/06/18 0841  GLUCAP 124* 98 144* 42* 59*    Lipid Profile: No results for input(s): CHOL, HDL, LDLCALC, TRIG, CHOLHDL, LDLDIRECT in the last 72 hours.  Thyroid Function Tests: No results for input(s): TSH, T4TOTAL, FREET4, T3FREE, THYROIDAB in the last 72 hours.  Anemia Panel: No results for input(s): VITAMINB12, FOLATE, FERRITIN, TIBC, IRON, RETICCTPCT in the last 72 hours.  Urine analysis:    Component Value Date/Time   COLORURINE STRAW (A) 03/30/2018 1933   APPEARANCEUR CLEAR 03/30/2018 1933   LABSPEC 1.016 03/30/2018 1933   PHURINE 7.0 03/30/2018 1933   GLUCOSEU >=500 (A) 03/30/2018 1933   HGBUR NEGATIVE 03/30/2018 1933   BILIRUBINUR NEGATIVE 03/30/2018 1933   KETONESUR 20 (A) 03/30/2018 1933   PROTEINUR NEGATIVE 03/30/2018 1933   UROBILINOGEN 0.2 12/27/2010 1858   NITRITE NEGATIVE 03/30/2018 1933   LEUKOCYTESUR NEGATIVE 03/30/2018 1933    Sepsis Labs: Lactic Acid, Venous    Component Value Date/Time   LATICACIDVEN 1.0 04/03/2018 1020    MICROBIOLOGY: Recent Results (from the past 240 hour(s))  Aerobic Culture  (superficial specimen)     Status: None   Collection Time: 03/27/18  2:00 PM  Result Value Ref Range Status   Specimen Description   Final    BACK Performed at [facility], [address], [place], [place] [address]    Special Requests   Final    NONE Performed at [facility], [address] [place], [place] [address]    Gram Stain   Final    FEW WBC PRESENT, PREDOMINANTLY PMN FEW GRAM POSITIVE COCCI RARE GRAM VARIABLE ROD Performed at [facility], [place] [address], [place], [place] [address]    Culture FEW VANCOMYCIN RESISTANT ENTEROCOCCUS  Final   Report Status 03/30/2018 FINAL  Final   Organism ID, Bacteria VANCOMYCIN RESISTANT ENTEROCOCCUS  Final      Susceptibility   Vancomycin resistant enterococcus - MIC*    AMPICILLIN >=32 RESISTANT Resistant     VANCOMYCIN >=32 RESISTANT Resistant     GENTAMICIN SYNERGY SENSITIVE Sensitive     LINEZOLID 2 SENSITIVE Sensitive     * FEW VANCOMYCIN RESISTANT ENTEROCOCCUS  Culture, blood (routine x 2)     Status: None (Preliminary result)   Collection Time: 04/03/18 10:20 AM  Result Value Ref Range Status   Specimen Description BLOOD LEFT WRIST  Final   Special Requests   Final    BOTTLES DRAWN AEROBIC AND ANAEROBIC Blood Culture adequate volume Performed at [facility], [place] [address] [place], [place] [address]    Culture NO GROWTH 2 DAYS  Final   Report Status PENDING  Incomplete  Culture, blood (routine x 2)     Status: None (Preliminary result)   Collection Time: 04/03/18 10:22 AM  Result Value Ref Range Status   Specimen Description BLOOD RIGHT WRIST  Final   Special Requests   Final    BOTTLES DRAWN AEROBIC AND ANAEROBIC Blood Culture results may not be optimal due to an inadequate volume of blood received in culture bottles Performed at [facility], [place] [address], [place], [place] [address]    Culture NO GROWTH 2 DAYS  Final   Report Status PENDING  Incomplete    RADIOLOGY  STUDIES/RESULTS: Dg Chest 2 View  Result Date: 03/30/2018 CLINICAL DATA:  Fall with fatigue. EXAM: CHEST - 2  VIEW COMPARISON:  03/18/2018 FINDINGS: 1835 hours. Low volumes. The cardio pericardial silhouette is enlarged. The lungs are clear without focal pneumonia, edema, pneumothorax or pleural effusion. The visualized bony structures of the thorax are intact. Degenerative changes again noted left shoulder. Telemetry leads overlie the chest. IMPRESSION: Interval improvement in basilar aeration. Low volumes without acute cardiopulmonary findings on today's study. Electronically Signed   By: Misty Stanley M.D.   On: 03/30/2018 18:54   Ct Head Wo Contrast  Result Date: 03/30/2018 CLINICAL DATA:  Fall, weakness, fatigue EXAM: CT HEAD WITHOUT CONTRAST TECHNIQUE: Contiguous axial images were obtained from the base of the skull through the vertex without intravenous contrast. COMPARISON:  02/28/2018 FINDINGS: Brain: No acute intracranial abnormality. Specifically, no hemorrhage, hydrocephalus, mass lesion, acute infarction, or significant intracranial injury. Vascular: No hyperdense vessel or unexpected calcification. Skull: No acute calvarial abnormality. Sinuses/Orbits: Visualized paranasal sinuses and mastoids clear. Orbital soft tissues unremarkable. Other: None IMPRESSION: No acute intracranial abnormality. Electronically Signed   By: Rolm Baptise M.D.   On: 03/30/2018 18:45   Dg Chest Portable 1 View  Result Date: 04/03/2018 CLINICAL DATA:  Fevers EXAM: PORTABLE CHEST 1 VIEW COMPARISON:  03/30/2018 FINDINGS: Cardiac shadow is stable. Aortic calcifications are again seen. The lungs are well aerated bilaterally. Mild right basilar atelectasis is noted. No sizable effusion is seen. No acute bony abnormality is noted. IMPRESSION: Mild right basilar atelectasis. Electronically Signed   By: Inez Catalina M.D.   On: 04/03/2018 09:28   Dg Chest Port 1 View  Result Date: 03/18/2018 CLINICAL DATA:  Wheezing EXAM:  PORTABLE CHEST 1 VIEW COMPARISON:  03/14/2018 FINDINGS: Cardiomegaly. Vascular congestion. Right lower lobe airspace opacity with small to moderate right effusion. No confluent opacity or effusion on the left. No acute bony abnormality. IMPRESSION: Cardiomegaly, vascular congestion. Small to moderate right pleural effusion with right lower lobe atelectasis or pneumonia. Electronically Signed   By: Rolm Baptise M.D.   On: 03/18/2018 20:14   Dg Chest Port 1 View  Result Date: 03/14/2018 CLINICAL DATA:  Shortness of breath EXAM: PORTABLE CHEST 1 VIEW COMPARISON:  03/05/2018 FINDINGS: There is bibasilar atelectasis. There is elevation of the right diaphragm. There is no focal consolidation. There is no pleural effusion or pneumothorax. The heart and mediastinal contours are unremarkable. The osseous structures are unremarkable. IMPRESSION: No active disease. Electronically Signed   By: Kathreen Devoid   On: 03/14/2018 20:25   US Abdomen Limited Ruq  Result Date: 03/16/2018 CLINICAL DATA:  Elevated LFTs EXAM: ULTRASOUND ABDOMEN LIMITED RIGHT UPPER QUADRANT COMPARISON:  None. FINDINGS: Gallbladder: No gallstones or wall thickening visualized. No sonographic Murphy sign noted by sonographer. Common bile duct: Diameter: 4 mm Liver: Increased in echogenicity. No focal lesion identified portal vein is patent on color Doppler imaging with normal direction of blood flow towards the liver. IMPRESSION: Hepatic steatosis. No cholelithiasis or sonographic evidence for acute cholecystitis. Electronically Signed   By: Lovey Newcomer M.D.   On: 03/16/2018 20:25     LOS: 3 days   Oren Binet, MD  [facility]  If 7PM-7AM, please contact night-coverage  Please page via [email or web address]  Go to [email or web address] and use [facility]'s universal password to access. If you do not have the password, please contact the hospital operator.  Locate the [facility] provider you are looking for under [facility] and page to a number that  you can be directly reached. If you still have difficulty reaching the provider, please page the [facility] (Director on Call)  for the Hospitalists listed on amion for assistance.  04/06/2018, 12:07 PM

## 2018-04-07 DIAGNOSIS — L089 Local infection of the skin and subcutaneous tissue, unspecified: Secondary | ICD-10-CM | POA: Diagnosis not present

## 2018-04-07 DIAGNOSIS — T148XXA Other injury of unspecified body region, initial encounter: Secondary | ICD-10-CM | POA: Diagnosis not present

## 2018-04-07 LAB — GLUCOSE, CAPILLARY
GLUCOSE-CAPILLARY: 42 mg/dL — AB (ref 70–99)
Glucose-Capillary: 87 mg/dL (ref 70–99)
Glucose-Capillary: 70 mg/dL (ref 70–99)
Glucose-Capillary: 136 mg/dL — ABNORMAL HIGH (ref 70–99)
Glucose-Capillary: 156 mg/dL — ABNORMAL HIGH (ref 70–99)
Glucose-Capillary: 205 mg/dL — ABNORMAL HIGH (ref 70–99)

## 2018-04-07 MED ORDER — DEXTROSE 50 % IV SOLN
INTRAVENOUS | Status: AC
  Administered 2018-04-07: 25 mL
  Filled 2018-04-07: qty 50

## 2018-04-07 MED ORDER — INSULIN GLARGINE 100 UNIT/ML ~~LOC~~ SOLN
8.0000 [IU] | Freq: Every day | SUBCUTANEOUS | Status: DC
  Administered 2018-04-07 – 2018-04-08 (×2): 8 [IU] via SUBCUTANEOUS
  Filled 2018-04-07 (×2): qty 0.08

## 2018-04-07 NOTE — Progress Notes (Signed)
Hypoglycemic Event  CBG: 42  Treatment: D50 25 mL (12.5 gm)  Symptoms: None  Follow-up CBG: Time:0710 CBG Result:85  Possible Reasons for Event: Inadequate meal intake  Comments/MD notified: Marcellina Millin

## 2018-04-07 NOTE — Progress Notes (Signed)
PROGRESS NOTE        PATIENT DETAILS Name: Heather Petty Age: 68 y.o. Sex: female Date of Birth: 1950/02/09 Admit Date: 03/30/2018 Admitting Physician Karmen Bongo, MD CHY:IFOYDXA, Christean Grief, MD  Brief Narrative: Patient is a 68 y.o. female with history of morbid obesity, schizophrenia, hypertension, dyslipidemia, DM, bedbound status (nonambulatory for 10 years) presented to the ED on 2/22 with weakness and falls.  She was being observed in the ED while awaiting placement, she subsequently developed a fever and was then admitted to the hospitalist service.  See below for further details  Subjective:  Patient in bed, appears comfortable, denies any headache, no fever, no chest pain or pressure, no shortness of breath , no abdominal pain. No focal weakness.   Assessment/Plan:   Fever: Left shoulder skin breakdown/ulcer, chronic present on admission, no purulence, continue wound care, previous MD had discussed with ID who recommended Keflex which will be continued.  All cultures are negative.  Continue to monitor.     Acute gouty flare involving left foot: Markedly better-minimal tenderness - now better with NSAIDs and Colchicine.  Hypertension: Controlled-continue Norvasc and Lotensin  Dyslipidemia:Continue statin  Peripheral neuropathy:Likely related to diabetes, continue Neurontin  Schizophrenia:Appears to be stable-continue risperidone and doxepin  Chronically elevated alkaline phosphatase:Suspect secondary to bedbound status/osseous issue rather than liver issue.  Left arm chronic full-thickness wound, unstageable sacral pressure injury, type II full-thickness wound to bilateral buttocks: All present prior to admission-appreciate wound care evaluation  Debility/deconditioning/bedbound status for past 10 years  Morbid obesity - follow with PCP for weight loss  Insulin-dependent DM-2:  Hypoglycemic this morning, Lantus dose dropped,  continue sliding scale with meals only.  Monitor.  CBG (last 3)  Recent Labs    04/07/18 0648 04/07/18 0712 04/07/18 0814  GLUCAP 42* 87 70   Lab Results  Component Value Date   HGBA1C 6.7 (H) 12/18/2017     DVT Prophylaxis: Prophylactic Lovenox   Code Status: Full code   Family Communication: None at bedside  Disposition Plan: Remain inpatient-SNF on discharge-awaiting insurance authorization  Antimicrobial agents: Anti-infectives (From admission, onward)   Start     Dose/Rate Route Frequency Ordered Stop   04/05/18 0945  cephALEXin (KEFLEX) capsule 500 mg     500 mg Oral Every 8 hours 04/05/18 0943     04/05/18 0000  cephALEXin (KEFLEX) 500 MG capsule     500 mg Oral Every 8 hours 04/05/18 1014 04/10/18 2359   04/03/18 1900  ceFAZolin (ANCEF) IVPB 2g/100 mL premix  Status:  Discontinued     2 g 200 mL/hr over 30 Minutes Intravenous Every 8 hours 04/03/18 1136 04/05/18 0943   04/03/18 1045  ceFAZolin (ANCEF) IVPB 2g/100 mL premix     2 g 200 mL/hr over 30 Minutes Intravenous  Once 04/03/18 1042 04/03/18 1128      Procedures: None  CONSULTS:  None  Time spent: 15- minutes-Greater than 50% of this time was spent in counseling, explanation of diagnosis, planning of further management, and coordination of care.  MEDICATIONS: Scheduled Meds: . allopurinol  300 mg Oral Daily  . amLODipine  10 mg Oral Daily  . antiseptic oral rinse  15 mL Mouth Rinse TID  . B-complex with vitamin C  1 tablet Oral Daily  . benazepril  20 mg Oral Daily  . cephALEXin  500 mg Oral Q8H  .  collagenase   Topical Daily  . doxepin  25 mg Oral BID  . enoxaparin (LOVENOX) injection  65 mg Subcutaneous Q24H  . famotidine  20 mg Oral QHS  . feeding supplement (PRO-STAT SUGAR FREE 64)  30 mL Oral TID WC  . folic acid  1 mg Oral Daily  . furosemide  20 mg Oral Daily  . gabapentin  300 mg Oral TID  . hydrocerin  1 application Topical BID  . hydrOXYzine  25 mg Oral TID  . insulin  aspart  0-15 Units Subcutaneous TID WC  . insulin glargine  8 Units Subcutaneous QHS  . liver oil-zinc oxide   Topical QID  . loratadine  10 mg Oral Daily  . magnesium citrate  1 Bottle Oral Once  . nystatin   Topical BID  . pravastatin  40 mg Oral Daily  . risperiDONE  0.5 mg Oral BID   Continuous Infusions:  PRN Meds:.acetaminophen **OR** [DISCONTINUED] acetaminophen, albuterol, azelastine **AND** fluticasone, bisacodyl, [DISCONTINUED] ondansetron **OR** ondansetron (ZOFRAN) IV, polyethylene glycol, sodium phosphate, traMADol   PHYSICAL EXAM: Vital signs: Vitals:   04/06/18 0543 04/06/18 1433 04/06/18 2119 04/07/18 0545  BP: 101/70 (!) 116/58 (!) 131/45 (!) 138/52  Pulse: 76 75 82 85  Resp:  20 18 18   Temp: (!) 97.4 F (36.3 C) 97.9 F (36.6 C) 98.3 F (36.8 C) 97.8 F (36.6 C)  TempSrc: Oral Oral Oral Oral  SpO2: 100% 97% 95% 96%  Weight:      Height:       Filed Weights   03/30/18 1756  Weight: 130 kg   Body mass index is 54.15 kg/m.   Exam  Awake Alert, Oriented X 3, No new F.N deficits, Normal affect [place].AT,PERRAL Supple Neck,No JVD, No cervical lymphadenopathy appriciated.  Symmetrical Chest wall movement, Good air movement bilaterally, CTAB RRR,No Gallops, Rubs or new Murmurs, No Parasternal Heave +ve B.Sounds, Abd Soft, No tenderness, No organomegaly appriciated, No rebound - guarding or rigidity. No Cyanosis, Clubbing or edema, L. Shoulder skin wound under bandage, no surrounding cellulitis   I have personally reviewed following labs and imaging studies  LABORATORY DATA: CBC: Recent Labs  Lab 04/03/18 1020 04/04/18 0429  WBC 9.1 5.8  NEUTROABS 6.7  --   HGB 10.5* 9.3*  HCT 33.9* 29.7*  MCV 98.0 96.7  PLT 133* 132*    Basic Metabolic Panel: Recent Labs  Lab 04/03/18 1020 04/04/18 0429 04/05/18 0407 04/06/18 0356  NA 138 139 139 140  K 3.5 3.3* 3.8 3.6  CL 103 104 106 110  CO2 25 25 25 25   GLUCOSE 165* 139* 119* 75  BUN 11 15 19 22     CREATININE 0.80 1.00 0.83 0.70  CALCIUM 8.3* 7.7* 7.6* 7.9*    GFR: Estimated Creatinine Clearance: 84.6 mL/min (by C-G formula based on SCr of 0.7 mg/dL).  Liver Function Tests: Recent Labs  Lab 04/03/18 1020  AST 37  ALT 41  ALKPHOS 448*  BILITOT 1.2  PROT 5.3*  ALBUMIN 2.1*   No results for input(s): LIPASE, AMYLASE in the last 168 hours. No results for input(s): AMMONIA in the last 168 hours.  Coagulation Profile: No results for input(s): INR, PROTIME in the last 168 hours.  Cardiac Enzymes: No results for input(s): CKTOTAL, CKMB, CKMBINDEX, TROPONINI in the last 168 hours.  BNP (last 3 results) No results for input(s): PROBNP in the last 8760 hours.  HbA1C: No results for input(s): HGBA1C in the last 72 hours.  CBG: Recent Labs  Lab 04/06/18 1700 04/06/18 2116 04/07/18 0648 04/07/18 0712 04/07/18 0814  GLUCAP 85 115* 42* 87 70    Lipid Profile: No results for input(s): CHOL, HDL, LDLCALC, TRIG, CHOLHDL, LDLDIRECT in the last 72 hours.  Thyroid Function Tests: No results for input(s): TSH, T4TOTAL, FREET4, T3FREE, THYROIDAB in the last 72 hours.  Anemia Panel: No results for input(s): VITAMINB12, FOLATE, FERRITIN, TIBC, IRON, RETICCTPCT in the last 72 hours.  Urine analysis:    Component Value Date/Time   COLORURINE STRAW (A) 03/30/2018 1933   APPEARANCEUR CLEAR 03/30/2018 1933   LABSPEC 1.016 03/30/2018 1933   PHURINE 7.0 03/30/2018 1933   GLUCOSEU >=500 (A) 03/30/2018 1933   HGBUR NEGATIVE 03/30/2018 1933   BILIRUBINUR NEGATIVE 03/30/2018 1933   KETONESUR 20 (A) 03/30/2018 1933   PROTEINUR NEGATIVE 03/30/2018 1933   UROBILINOGEN 0.2 12/27/2010 1858   NITRITE NEGATIVE 03/30/2018 1933   LEUKOCYTESUR NEGATIVE 03/30/2018 1933    Sepsis Labs: Lactic Acid, Venous    Component Value Date/Time   LATICACIDVEN 1.0 04/03/2018 1020    MICROBIOLOGY: Recent Results (from the past 240 hour(s))  Culture, blood (routine x 2)     Status: None  (Preliminary result)   Collection Time: 04/03/18 10:20 AM  Result Value Ref Range Status   Specimen Description BLOOD LEFT WRIST  Final   Special Requests   Final    BOTTLES DRAWN AEROBIC AND ANAEROBIC Blood Culture adequate volume   Culture   Final    NO GROWTH 3 DAYS Performed at [facility], [place] [address] [place], [place] [address]    Report Status PENDING  Incomplete  Culture, blood (routine x 2)     Status: None (Preliminary result)   Collection Time: 04/03/18 10:22 AM  Result Value Ref Range Status   Specimen Description BLOOD RIGHT WRIST  Final   Special Requests   Final    BOTTLES DRAWN AEROBIC AND ANAEROBIC Blood Culture results may not be optimal due to an inadequate volume of blood received in culture bottles   Culture   Final    NO GROWTH 3 DAYS Performed at [facility], [place] [address] [place], [place] [address]    Report Status PENDING  Incomplete    RADIOLOGY STUDIES/RESULTS: Dg Chest 2 View  Result Date: 03/30/2018 CLINICAL DATA:  Fall with fatigue. EXAM: CHEST - 2 VIEW COMPARISON:  03/18/2018 FINDINGS: 1835 hours. Low volumes. The cardio pericardial silhouette is enlarged. The lungs are clear without focal pneumonia, edema, pneumothorax or pleural effusion. The visualized bony structures of the thorax are intact. Degenerative changes again noted left shoulder. Telemetry leads overlie the chest. IMPRESSION: Interval improvement in basilar aeration. Low volumes without acute cardiopulmonary findings on today's study. Electronically Signed   By: Misty Stanley M.D.   On: 03/30/2018 18:54   Ct Head Wo Contrast  Result Date: 03/30/2018 CLINICAL DATA:  Fall, weakness, fatigue EXAM: CT HEAD WITHOUT CONTRAST TECHNIQUE: Contiguous axial images were obtained from the base of the skull through the vertex without intravenous contrast. COMPARISON:  02/28/2018 FINDINGS: Brain: No acute intracranial abnormality. Specifically, no hemorrhage, hydrocephalus, mass lesion,  acute infarction, or significant intracranial injury. Vascular: No hyperdense vessel or unexpected calcification. Skull: No acute calvarial abnormality. Sinuses/Orbits: Visualized paranasal sinuses and mastoids clear. Orbital soft tissues unremarkable. Other: None IMPRESSION: No acute intracranial abnormality. Electronically Signed   By: Rolm Baptise M.D.   On: 03/30/2018 18:45   Dg Chest Portable 1 View  Result Date: 04/03/2018 CLINICAL DATA:  Fevers EXAM: PORTABLE CHEST 1 VIEW COMPARISON:  03/30/2018 FINDINGS: Cardiac shadow is stable. Aortic calcifications are again seen. The lungs are well aerated bilaterally. Mild right basilar atelectasis is noted. No sizable effusion is seen. No acute bony abnormality is noted. IMPRESSION: Mild right basilar atelectasis. Electronically Signed   By: Inez Catalina M.D.   On: 04/03/2018 09:28   Dg Chest Port 1 View  Result Date: 03/18/2018 CLINICAL DATA:  Wheezing EXAM: PORTABLE CHEST 1 VIEW COMPARISON:  03/14/2018 FINDINGS: Cardiomegaly. Vascular congestion. Right lower lobe airspace opacity with small to moderate right effusion. No confluent opacity or effusion on the left. No acute bony abnormality. IMPRESSION: Cardiomegaly, vascular congestion. Small to moderate right pleural effusion with right lower lobe atelectasis or pneumonia. Electronically Signed   By: Rolm Baptise M.D.   On: 03/18/2018 20:14   Dg Chest Port 1 View  Result Date: 03/14/2018 CLINICAL DATA:  Shortness of breath EXAM: PORTABLE CHEST 1 VIEW COMPARISON:  03/05/2018 FINDINGS: There is bibasilar atelectasis. There is elevation of the right diaphragm. There is no focal consolidation. There is no pleural effusion or pneumothorax. The heart and mediastinal contours are unremarkable. The osseous structures are unremarkable. IMPRESSION: No active disease. Electronically Signed   By: Kathreen Devoid   On: 03/14/2018 20:25   US Abdomen Limited Ruq  Result Date: 03/16/2018 CLINICAL DATA:  Elevated LFTs  EXAM: ULTRASOUND ABDOMEN LIMITED RIGHT UPPER QUADRANT COMPARISON:  None. FINDINGS: Gallbladder: No gallstones or wall thickening visualized. No sonographic Murphy sign noted by sonographer. Common bile duct: Diameter: 4 mm Liver: Increased in echogenicity. No focal lesion identified portal vein is patent on color Doppler imaging with normal direction of blood flow towards the liver. IMPRESSION: Hepatic steatosis. No cholelithiasis or sonographic evidence for acute cholecystitis. Electronically Signed   By: Lovey Newcomer M.D.   On: 03/16/2018 20:25     Signature  Lala Lund M.D on 04/07/2018 at 9:36 AM   -  To page go to [email or web address]       LOS: 4 days

## 2018-04-07 NOTE — Progress Notes (Signed)
Attempted to give small snack with dose of lantus to prevent pt BS from dropping to low in the AM. Pt refused. Pt educated and continued to refuse. Will monitor overnight.

## 2018-04-08 DIAGNOSIS — L089 Local infection of the skin and subcutaneous tissue, unspecified: Secondary | ICD-10-CM | POA: Diagnosis not present

## 2018-04-08 DIAGNOSIS — T148XXA Other injury of unspecified body region, initial encounter: Secondary | ICD-10-CM | POA: Diagnosis not present

## 2018-04-08 LAB — CULTURE, BLOOD (ROUTINE X 2)
Culture: NO GROWTH
Culture: NO GROWTH
Special Requests: ADEQUATE

## 2018-04-08 LAB — GLUCOSE, CAPILLARY
Glucose-Capillary: 75 mg/dL (ref 70–99)
Glucose-Capillary: 106 mg/dL — ABNORMAL HIGH (ref 70–99)
Glucose-Capillary: 168 mg/dL — ABNORMAL HIGH (ref 70–99)
Glucose-Capillary: 181 mg/dL — ABNORMAL HIGH (ref 70–99)
Glucose-Capillary: 455 mg/dL — ABNORMAL HIGH (ref 70–99)

## 2018-04-08 MED ORDER — INSULIN ASPART 100 UNIT/ML ~~LOC~~ SOLN
10.0000 [IU] | Freq: Once | SUBCUTANEOUS | Status: AC
  Administered 2018-04-08: 10 [IU] via SUBCUTANEOUS

## 2018-04-08 NOTE — Progress Notes (Signed)
CBG 455, pt has 8 units of Lantus scheduled tonight, no other coverage. Baltazar Najjar, NP notified.

## 2018-04-08 NOTE — Progress Notes (Signed)
[facility] liaison awaiting call back from patient's daughter (patient does not have info) requesting financial information before they can accept patient with Medicaid. CSW left another voicemail for daughter.   Percell Locus Addisen Chappelle LCSW [phone]

## 2018-04-08 NOTE — Progress Notes (Signed)
CSW received a call back from patient's daughter. She states that she is requesting increased PCS hours from [facility] for patient to return home. CSW discussed SNF placement. She reports that patient will lose Section 8 housing if she has to give her check up to a SNF. She states her mother really wants to be at home but would need increased care. CSW and RNCM to send in form for increased care services at home and will follow up. Patient's daughter also requesting cognitive testing to see if patient has dementia sp that she can qualify for expedited CAPS services. She will also look into PACE program. CSW requested her speak with SNF liaison just in case patient is unable to return home. Will follow up on plan.   Percell Locus Jessilyn Catino LCSW [phone]

## 2018-04-08 NOTE — Progress Notes (Signed)
PROGRESS NOTE        PATIENT DETAILS Name: Heather Petty Age: 68 y.o. Sex: female Date of Birth: 1950/07/24 Admit Date: 03/30/2018 Admitting Physician Karmen Bongo, MD [id number], Christean Grief, MD  Brief Narrative: Patient is a 68 y.o. female with history of morbid obesity, schizophrenia, hypertension, dyslipidemia, DM, bedbound status (nonambulatory for 10 years) presented to the ED on 2/22 with weakness and falls.  She was being observed in the ED while awaiting placement, she subsequently developed a fever and was then admitted to the hospitalist service.  See below for further details  Subjective:  Patient in bed, appears comfortable, denies any headache, no fever, no chest pain or pressure, no shortness of breath , no abdominal pain. No focal weakness.  Assessment/Plan:  Fever: Left shoulder skin breakdown/ulcer, chronic present on admission, no purulence, continue wound care, previous MD had discussed with ID who recommended Keflex which will be continued.  All cultures are negative.  Continue to monitor.     Acute gouty flare involving left foot: Markedly better-minimal tenderness - now better with NSAIDs and Colchicine.  Hypertension: Controlled-continue Norvasc and Lotensin  Dyslipidemia:Continue statin  Peripheral neuropathy:Likely related to diabetes, continue Neurontin  Schizophrenia:Appears to be stable-continue risperidone and doxepin  Chronically elevated alkaline phosphatase:Suspect secondary to bedbound status/osseous issue rather than liver issue.  Left arm chronic full-thickness wound, unstageable sacral pressure injury, type II full-thickness wound to bilateral buttocks: All present prior to admission-appreciate wound care evaluation  Debility/deconditioning/bedbound status for past 10 years - SNF  Morbid obesity - follow with PCP for weight loss  Insulin-dependent DM-2:  Intermittently becomes hypoglycemic, Lantus dose  dropped, continue sliding scale, CBGs better.  CBG (last 3)  Recent Labs    04/07/18 1643 04/07/18 2242 04/08/18 0839  GLUCAP 156* 205* 106*   Lab Results  Component Value Date   HGBA1C 6.7 (H) 12/18/2017     DVT Prophylaxis: Prophylactic Lovenox   Code Status: Full code   Family Communication: None at bedside  Disposition Plan: Remain inpatient-SNF on discharge-awaiting insurance authorization  Antimicrobial agents: Anti-infectives (From admission, onward)   Start     Dose/Rate Route Frequency Ordered Stop   04/05/18 0945  cephALEXin (KEFLEX) capsule 500 mg     500 mg Oral Every 8 hours 04/05/18 0943     04/05/18 0000  cephALEXin (KEFLEX) 500 MG capsule     500 mg Oral Every 8 hours 04/05/18 1014 04/10/18 2359   04/03/18 1900  ceFAZolin (ANCEF) IVPB 2g/100 mL premix  Status:  Discontinued     2 g 200 mL/hr over 30 Minutes Intravenous Every 8 hours 04/03/18 1136 04/05/18 0943   04/03/18 1045  ceFAZolin (ANCEF) IVPB 2g/100 mL premix     2 g 200 mL/hr over 30 Minutes Intravenous  Once 04/03/18 1042 04/03/18 1128      Procedures: None  CONSULTS:  None  Time spent: 15- minutes-Greater than 50% of this time was spent in counseling, explanation of diagnosis, planning of further management, and coordination of care.  MEDICATIONS: Scheduled Meds: . allopurinol  300 mg Oral Daily  . amLODipine  10 mg Oral Daily  . antiseptic oral rinse  15 mL Mouth Rinse TID  . B-complex with vitamin C  1 tablet Oral Daily  . benazepril  20 mg Oral Daily  . cephALEXin  500 mg Oral Q8H  . collagenase  Topical Daily  . doxepin  25 mg Oral BID  . enoxaparin (LOVENOX) injection  65 mg Subcutaneous Q24H  . famotidine  20 mg Oral QHS  . feeding supplement (PRO-STAT SUGAR FREE 64)  30 mL Oral TID WC  . folic acid  1 mg Oral Daily  . furosemide  20 mg Oral Daily  . gabapentin  300 mg Oral TID  . hydrocerin  1 application Topical BID  . hydrOXYzine  25 mg Oral TID  . insulin  aspart  0-15 Units Subcutaneous TID WC  . insulin glargine  8 Units Subcutaneous QHS  . liver oil-zinc oxide   Topical QID  . loratadine  10 mg Oral Daily  . magnesium citrate  1 Bottle Oral Once  . nystatin   Topical BID  . pravastatin  40 mg Oral Daily  . risperiDONE  0.5 mg Oral BID   Continuous Infusions:  PRN Meds:.acetaminophen **OR** [DISCONTINUED] acetaminophen, albuterol, azelastine **AND** fluticasone, bisacodyl, [DISCONTINUED] ondansetron **OR** ondansetron (ZOFRAN) IV, polyethylene glycol, sodium phosphate, traMADol   PHYSICAL EXAM: Vital signs: Vitals:   04/07/18 1427 04/07/18 2243 04/08/18 0513 04/08/18 0924  BP: (!) 143/68 (!) 135/58 (!) 148/61 127/84  Pulse: 84 83 83   Resp: 20 18 18    Temp: 97.8 F (36.6 C) 97.9 F (36.6 C) (!) 97.4 F (36.3 C)   TempSrc:  Oral Oral   SpO2: 94% 95% 97%   Weight:      Height:       Filed Weights   03/30/18 1756  Weight: 130 kg   Body mass index is 54.15 kg/m.   Exam  Awake Alert, Oriented X 3, No new F.N deficits, Normal affect [place].AT,PERRAL Supple Neck,No JVD, No cervical lymphadenopathy appriciated.  Symmetrical Chest wall movement, Good air movement bilaterally, CTAB RRR,No Gallops, Rubs or new Murmurs, No Parasternal Heave +ve B.Sounds, Abd Soft, No tenderness, No organomegaly appriciated, No rebound - guarding or rigidity. No Cyanosis, Clubbing or edema, L. Shoulder skin wound under bandage, no surrounding cellulitis   I have personally reviewed following labs and imaging studies  LABORATORY DATA: CBC: Recent Labs  Lab 04/03/18 1020 04/04/18 0429  WBC 9.1 5.8  NEUTROABS 6.7  --   HGB 10.5* 9.3*  HCT 33.9* 29.7*  MCV 98.0 96.7  PLT 133* 132*    Basic Metabolic Panel: Recent Labs  Lab 04/03/18 1020 04/04/18 0429 04/05/18 0407 04/06/18 0356  NA 138 139 139 140  K 3.5 3.3* 3.8 3.6  CL 103 104 106 110  CO2 25 25 25 25   GLUCOSE 165* 139* 119* 75  BUN 11 15 19 22   CREATININE 0.80 1.00 0.83 0.70   CALCIUM 8.3* 7.7* 7.6* 7.9*    GFR: Estimated Creatinine Clearance: 84.6 mL/min (by C-G formula based on SCr of 0.7 mg/dL).  Liver Function Tests: Recent Labs  Lab 04/03/18 1020  AST 37  ALT 41  ALKPHOS 448*  BILITOT 1.2  PROT 5.3*  ALBUMIN 2.1*   No results for input(s): LIPASE, AMYLASE in the last 168 hours. No results for input(s): AMMONIA in the last 168 hours.  Coagulation Profile: No results for input(s): INR, PROTIME in the last 168 hours.  Cardiac Enzymes: No results for input(s): CKTOTAL, CKMB, CKMBINDEX, TROPONINI in the last 168 hours.  BNP (last 3 results) No results for input(s): PROBNP in the last 8760 hours.  HbA1C: No results for input(s): HGBA1C in the last 72 hours.  CBG: Recent Labs  Lab 04/07/18 0814 04/07/18 1234 04/07/18  1643 04/07/18 2242 04/08/18 0839  GLUCAP 70 136* 156* 205* 106*    Lipid Profile: No results for input(s): CHOL, HDL, LDLCALC, TRIG, CHOLHDL, LDLDIRECT in the last 72 hours.  Thyroid Function Tests: No results for input(s): TSH, T4TOTAL, FREET4, T3FREE, THYROIDAB in the last 72 hours.  Anemia Panel: No results for input(s): VITAMINB12, FOLATE, FERRITIN, TIBC, IRON, RETICCTPCT in the last 72 hours.  Urine analysis:    Component Value Date/Time   COLORURINE STRAW (A) 03/30/2018 1933   APPEARANCEUR CLEAR 03/30/2018 1933   LABSPEC 1.016 03/30/2018 1933   PHURINE 7.0 03/30/2018 1933   GLUCOSEU >=500 (A) 03/30/2018 1933   HGBUR NEGATIVE 03/30/2018 1933   BILIRUBINUR NEGATIVE 03/30/2018 1933   KETONESUR 20 (A) 03/30/2018 1933   PROTEINUR NEGATIVE 03/30/2018 1933   UROBILINOGEN 0.2 12/27/2010 1858   NITRITE NEGATIVE 03/30/2018 1933   LEUKOCYTESUR NEGATIVE 03/30/2018 1933    Sepsis Labs: Lactic Acid, Venous    Component Value Date/Time   LATICACIDVEN 1.0 04/03/2018 1020    MICROBIOLOGY: Recent Results (from the past 240 hour(s))  Culture, blood (routine x 2)     Status: None   Collection Time: 04/03/18  10:20 AM  Result Value Ref Range Status   Specimen Description BLOOD LEFT WRIST  Final   Special Requests   Final    BOTTLES DRAWN AEROBIC AND ANAEROBIC Blood Culture adequate volume Performed at [facility], [place] [address] [place], [place] [address]    Culture NO GROWTH 5 DAYS  Final   Report Status 04/08/2018 FINAL  Final  Culture, blood (routine x 2)     Status: None   Collection Time: 04/03/18 10:22 AM  Result Value Ref Range Status   Specimen Description BLOOD RIGHT WRIST  Final   Special Requests   Final    BOTTLES DRAWN AEROBIC AND ANAEROBIC Blood Culture results may not be optimal due to an inadequate volume of blood received in culture bottles Performed at [facility], [place] [address], [place], [place] [address]    Culture NO GROWTH 5 DAYS  Final   Report Status 04/08/2018 FINAL  Final    RADIOLOGY STUDIES/RESULTS: Dg Chest 2 View  Result Date: 03/30/2018 CLINICAL DATA:  Fall with fatigue. EXAM: CHEST - 2 VIEW COMPARISON:  03/18/2018 FINDINGS: 1835 hours. Low volumes. The cardio pericardial silhouette is enlarged. The lungs are clear without focal pneumonia, edema, pneumothorax or pleural effusion. The visualized bony structures of the thorax are intact. Degenerative changes again noted left shoulder. Telemetry leads overlie the chest. IMPRESSION: Interval improvement in basilar aeration. Low volumes without acute cardiopulmonary findings on today's study. Electronically Signed   By: Misty Stanley M.D.   On: 03/30/2018 18:54   Ct Head Wo Contrast  Result Date: 03/30/2018 CLINICAL DATA:  Fall, weakness, fatigue EXAM: CT HEAD WITHOUT CONTRAST TECHNIQUE: Contiguous axial images were obtained from the base of the skull through the vertex without intravenous contrast. COMPARISON:  02/28/2018 FINDINGS: Brain: No acute intracranial abnormality. Specifically, no hemorrhage, hydrocephalus, mass lesion, acute infarction, or significant intracranial injury. Vascular: No  hyperdense vessel or unexpected calcification. Skull: No acute calvarial abnormality. Sinuses/Orbits: Visualized paranasal sinuses and mastoids clear. Orbital soft tissues unremarkable. Other: None IMPRESSION: No acute intracranial abnormality. Electronically Signed   By: Rolm Baptise M.D.   On: 03/30/2018 18:45   Dg Chest Portable 1 View  Result Date: 04/03/2018 CLINICAL DATA:  Fevers EXAM: PORTABLE CHEST 1 VIEW COMPARISON:  03/30/2018 FINDINGS: Cardiac shadow is stable. Aortic calcifications  are again seen. The lungs are well aerated bilaterally. Mild right basilar atelectasis is noted. No sizable effusion is seen. No acute bony abnormality is noted. IMPRESSION: Mild right basilar atelectasis. Electronically Signed   By: Inez Catalina M.D.   On: 04/03/2018 09:28   Dg Chest Port 1 View  Result Date: 03/18/2018 CLINICAL DATA:  Wheezing EXAM: PORTABLE CHEST 1 VIEW COMPARISON:  03/14/2018 FINDINGS: Cardiomegaly. Vascular congestion. Right lower lobe airspace opacity with small to moderate right effusion. No confluent opacity or effusion on the left. No acute bony abnormality. IMPRESSION: Cardiomegaly, vascular congestion. Small to moderate right pleural effusion with right lower lobe atelectasis or pneumonia. Electronically Signed   By: Rolm Baptise M.D.   On: 03/18/2018 20:14   Dg Chest Port 1 View  Result Date: 03/14/2018 CLINICAL DATA:  Shortness of breath EXAM: PORTABLE CHEST 1 VIEW COMPARISON:  03/05/2018 FINDINGS: There is bibasilar atelectasis. There is elevation of the right diaphragm. There is no focal consolidation. There is no pleural effusion or pneumothorax. The heart and mediastinal contours are unremarkable. The osseous structures are unremarkable. IMPRESSION: No active disease. Electronically Signed   By: Kathreen Devoid   On: 03/14/2018 20:25   US Abdomen Limited Ruq  Result Date: 03/16/2018 CLINICAL DATA:  Elevated LFTs EXAM: ULTRASOUND ABDOMEN LIMITED RIGHT UPPER QUADRANT COMPARISON:   None. FINDINGS: Gallbladder: No gallstones or wall thickening visualized. No sonographic Murphy sign noted by sonographer. Common bile duct: Diameter: 4 mm Liver: Increased in echogenicity. No focal lesion identified portal vein is patent on color Doppler imaging with normal direction of blood flow towards the liver. IMPRESSION: Hepatic steatosis. No cholelithiasis or sonographic evidence for acute cholecystitis. Electronically Signed   By: Lovey Newcomer M.D.   On: 03/16/2018 20:25     Signature  Lala Lund M.D on 04/08/2018 at 9:45 AM   -  To page go to [email or web address]       LOS: 5 days

## 2018-04-08 NOTE — Progress Notes (Signed)
Physical Therapy Treatment Patient Details Name: Heather Petty MRN: [id number] DOB: 1950-04-23 Today's Date: 04/08/2018    History of Present Illness Patient is 68 y/o female presenting to hospital due to inability to care for self at home. Patient recently d/c from SNF. PMH includes AKI, asthma, DMII, HTN, schizophrenia, morbid obesity, dermatitis, and chronic sacral wounds.    PT Comments    Pt presented in bed and agreed to engage in therapeutic activity. Pt presented with stool incontinence. Pt able to progress to standing at [facility] for ~60 seconds before her pain became 8/10 and sat back on EOB. Attempted to perform pericare and pt presented with partial BM. Pt attempted to use bed pan first and could not complete BM, and  required transfer to [facility] to complete BM. Call bell placed within reach as she stated she needed room to use [facility], and nursing notified of her transfer. Pt progress indicative of SNF vs. 24 hour supervision placement at this time. Plan to progress pt bed mobility and transfer training.   Follow Up Recommendations  SNF;Supervision/Assistance - 24 hour     Equipment Recommendations  None recommended by PT    Recommendations for Other Services       Precautions / Restrictions Precautions Precautions: Fall Restrictions Weight Bearing Restrictions: No    Mobility  Bed Mobility Overal bed mobility: Needs Assistance Bed Mobility: Rolling;Sidelying to Sit;Sit to Supine Rolling: Mod assist;+2 for physical assistance Sidelying to sit: +2 for physical assistance;Mod assist   Sit to supine: Mod assist;+2 for physical assistance   General bed mobility comments: Patient required modA +2 for trunk control and LE managment for all bed mobility. Patient required increased time and effort to sit EOB.  Transfers Overall transfer level: Needs assistance Equipment used: Rolling walker (2 wheeled) Transfers: Sit to/from Stand Sit to Stand: Mod assist;+2 physical assistance          General transfer comment: Assist to power-up to full stand as well as gain/maintain standing balance. Pt able to stand for limited time d/t complaints of increasing pain in BLE feet. Pt was only mod A +1 on the sara stedy, whereas with the RW the pt is mod A +2.   Ambulation/Gait             General Gait Details: Unable at this time.    Stairs             Wheelchair Mobility    Modified Rankin (Stroke Patients Only)       Balance Overall balance assessment: Needs assistance Sitting-balance support: Bilateral upper extremity supported Sitting balance-Leahy Scale: Good Sitting balance - Comments: Patient able to maintain sitting EOB without LOB   Standing balance support: Bilateral upper extremity supported;During functional activity Standing balance-Leahy Scale: Poor Standing balance comment: Reliant on UE support + stedy                             Cognition Arousal/Alertness: Awake/alert Behavior During Therapy: Anxious Overall Cognitive Status: Within Functional Limits for tasks assessed                                 General Comments: Answering questions appropriately. No family present to confirm accuracy but overall matches with chart review.      Exercises      General Comments General comments (skin integrity, edema, etc.): Pt skin is improving.  Pertinent Vitals/Pain Faces Pain Scale: Hurts a little bit Pain Location: bilateral feet Pain Descriptors / Indicators: Aching    Home Living                      Prior Function            PT Goals (current goals can now be found in the care plan section) Acute Rehab PT Goals Patient Stated Goal: to get out of hospital PT Goal Formulation: With patient Potential to Achieve Goals: Fair    Frequency    Min 2X/week      PT Plan      Co-evaluation              AM-PAC PT "6 Clicks" Mobility   Outcome Measure  Help needed turning  from your back to your side while in a flat bed without using bedrails?: A Lot Help needed moving from lying on your back to sitting on the side of a flat bed without using bedrails?: A Lot Help needed moving to and from a bed to a chair (including a wheelchair)?: A Lot Help needed standing up from a chair using your arms (e.g., wheelchair or bedside chair)?: A Lot Help needed to walk in hospital room?: Total Help needed climbing 3-5 steps with a railing? : Total 6 Click Score: 10    End of Session Equipment Utilized During Treatment: Gait belt Activity Tolerance: Patient limited by pain Patient left: in chair;with chair alarm set;with call bell/phone within reach(On BSC trying to have a BM with nursing notified) Nurse Communication: Mobility status PT Visit Diagnosis: Other abnormalities of gait and mobility (R26.89);Muscle weakness (generalized) (M62.81);Pain;History of falling (Z91.81) Pain - part of body: Leg     Time: 5366-4403 PT Time Calculation (min) (ACUTE ONLY): 33 min  Charges:  $Therapeutic Activity: 23-37 mins                     Maryelizabeth Kaufmann, SPTA   Maryelizabeth Kaufmann 04/08/2018, 3:47 PM

## 2018-04-09 DIAGNOSIS — L089 Local infection of the skin and subcutaneous tissue, unspecified: Secondary | ICD-10-CM | POA: Diagnosis not present

## 2018-04-09 DIAGNOSIS — T148XXA Other injury of unspecified body region, initial encounter: Secondary | ICD-10-CM | POA: Diagnosis not present

## 2018-04-09 LAB — BASIC METABOLIC PANEL
Anion gap: 10 (ref 5–15)
BUN: 19 mg/dL (ref 8–23)
CO2: 23 mmol/L (ref 22–32)
CREATININE: 0.66 mg/dL (ref 0.44–1.00)
Calcium: 8.6 mg/dL — ABNORMAL LOW (ref 8.9–10.3)
Chloride: 107 mmol/L (ref 98–111)
GFR calc Af Amer: >60 mL/min (ref >60)
GFR calc non Af Amer: >60 mL/min (ref >60)
Glucose, Bld: 68 mg/dL — ABNORMAL LOW (ref 70–99)
Potassium: 3.8 mmol/L (ref 3.5–5.1)
Sodium: 140 mmol/L (ref 135–145)

## 2018-04-09 LAB — GLUCOSE, CAPILLARY
GLUCOSE-CAPILLARY: 145 mg/dL — AB (ref 70–99)
Glucose-Capillary: 75 mg/dL (ref 70–99)
Glucose-Capillary: 156 mg/dL — ABNORMAL HIGH (ref 70–99)
Glucose-Capillary: 211 mg/dL — ABNORMAL HIGH (ref 70–99)

## 2018-04-09 LAB — CBC
HCT: 34 % — ABNORMAL LOW (ref 36.0–46.0)
Hemoglobin: 10.9 g/dL — ABNORMAL LOW (ref 12.0–15.0)
MCH: 31 pg (ref 26.0–34.0)
MCHC: 32.1 g/dL (ref 30.0–36.0)
MCV: 96.6 fL (ref 80.0–100.0)
Platelets: 286 10*3/uL (ref 150–400)
RBC: 3.52 MIL/uL — ABNORMAL LOW (ref 3.87–5.11)
RDW: 15.4 % (ref 11.5–15.5)
WBC: 5.6 10*3/uL (ref 4.0–10.5)
nRBC: 0 % (ref 0.0–0.2)

## 2018-04-09 MED ORDER — INSULIN ASPART 100 UNIT/ML ~~LOC~~ SOLN
0.0000 [IU] | Freq: Every day | SUBCUTANEOUS | Status: DC
  Administered 2018-04-09: 2 [IU] via SUBCUTANEOUS

## 2018-04-09 MED ORDER — INSULIN ASPART 100 UNIT/ML ~~LOC~~ SOLN
0.0000 [IU] | Freq: Three times a day (TID) | SUBCUTANEOUS | Status: DC
  Administered 2018-04-09: 2 [IU] via SUBCUTANEOUS
  Administered 2018-04-09: 3 [IU] via SUBCUTANEOUS
  Administered 2018-04-10: 2 [IU] via SUBCUTANEOUS
  Administered 2018-04-10: 5 [IU] via SUBCUTANEOUS
  Administered 2018-04-11: 2 [IU] via SUBCUTANEOUS
  Administered 2018-04-11: 5 [IU] via SUBCUTANEOUS
  Administered 2018-04-11 – 2018-04-12 (×2): 3 [IU] via SUBCUTANEOUS
  Administered 2018-04-12: 5 [IU] via SUBCUTANEOUS

## 2018-04-09 MED ORDER — INSULIN GLARGINE 100 UNIT/ML ~~LOC~~ SOLN
7.0000 [IU] | Freq: Every day | SUBCUTANEOUS | Status: DC
  Administered 2018-04-09 – 2018-04-12 (×4): 7 [IU] via SUBCUTANEOUS
  Filled 2018-04-09 (×5): qty 0.07

## 2018-04-09 NOTE — Progress Notes (Signed)
PROGRESS NOTE        PATIENT DETAILS Name: Heather Petty Age: 68 y.o. Sex: female Date of Birth: [birth year] Admit Date: 03/30/2018 Admitting Physician Karmen Bongo, MD MEB:[id number], Christean Grief, MD  Brief Narrative: Patient is a 68 y.o. female with history of morbid obesity, schizophrenia, hypertension, dyslipidemia, DM, bedbound status (nonambulatory for 10 years) presented to the ED on 2/22 with weakness and falls.  She was being observed in the ED while awaiting placement, she subsequently developed a fever and was then admitted to the hospitalist service.  See below for further details  Subjective:  Patient in bed, appears comfortable, denies any headache, no fever, no chest pain or pressure, no shortness of breath , no abdominal pain. No focal weakness.   Assessment/Plan:  Fever: Left shoulder skin breakdown/ulcer, chronic present on admission, no purulence, continue wound care, previous MD had discussed with ID who recommended Keflex which will be continued.  All cultures are negative.  Continue to monitor.     Acute gouty flare involving left foot: Markedly better-minimal tenderness - now better with NSAIDs and Colchicine.  Hypertension: Controlled-continue Norvasc and Lotensin  Dyslipidemia:Continue statin  Peripheral neuropathy:Likely related to diabetes, continue Neurontin  Schizophrenia:Appears to be stable-continue risperidone and doxepin  Chronically elevated alkaline phosphatase:Suspect secondary to bedbound status/osseous issue rather than liver issue.  Left arm chronic full-thickness wound, unstageable sacral pressure injury, type II full-thickness wound to bilateral buttocks: All present prior to admission-appreciate wound care evaluation  Debility/deconditioning/bedbound status for past 10 years - SNF  Morbid obesity - follow with PCP for weight loss  Insulin-dependent DM-2:  Sugars are extremely labile currently on  Lantus, adjusted sliding scale for better control.,  Usually a.m. sugars running low hence will drop Lantus dose and add nighttime sliding scale.  CBG (last 3)  Recent Labs    04/08/18 1715 04/08/18 2118 04/09/18 0845  GLUCAP 181* 455* 75   Lab Results  Component Value Date   HGBA1C 6.7 (H) 12/18/2017     DVT Prophylaxis: Prophylactic Lovenox   Code Status: Full code   Family Communication: None at bedside  Disposition Plan: Remain inpatient-SNF on discharge-awaiting insurance authorization  Antimicrobial agents: Anti-infectives (From admission, onward)   Start     Dose/Rate Route Frequency Ordered Stop   04/05/18 0945  cephALEXin (KEFLEX) capsule 500 mg     500 mg Oral Every 8 hours 04/05/18 0943     04/05/18 0000  cephALEXin (KEFLEX) 500 MG capsule     500 mg Oral Every 8 hours 04/05/18 1014 04/10/18 2359   04/03/18 1900  ceFAZolin (ANCEF) IVPB 2g/100 mL premix  Status:  Discontinued     2 g 200 mL/hr over 30 Minutes Intravenous Every 8 hours 04/03/18 1136 04/05/18 0943   04/03/18 1045  ceFAZolin (ANCEF) IVPB 2g/100 mL premix     2 g 200 mL/hr over 30 Minutes Intravenous  Once 04/03/18 1042 04/03/18 1128      Procedures: None  CONSULTS:  None  Time spent: 15- minutes-Greater than 50% of this time was spent in counseling, explanation of diagnosis, planning of further management, and coordination of care.  MEDICATIONS: Scheduled Meds: . allopurinol  300 mg Oral Daily  . amLODipine  10 mg Oral Daily  . antiseptic oral rinse  15 mL Mouth Rinse TID  . B-complex with vitamin C  1 tablet Oral Daily  .  benazepril  20 mg Oral Daily  . cephALEXin  500 mg Oral Q8H  . collagenase   Topical Daily  . doxepin  25 mg Oral BID  . enoxaparin (LOVENOX) injection  65 mg Subcutaneous Q24H  . famotidine  20 mg Oral QHS  . feeding supplement (PRO-STAT SUGAR FREE 64)  30 mL Oral TID WC  . folic acid  1 mg Oral Daily  . furosemide  20 mg Oral Daily  . gabapentin  300 mg  Oral TID  . hydrocerin  1 application Topical BID  . hydrOXYzine  25 mg Oral TID  . insulin aspart  0-15 Units Subcutaneous TID WC  . insulin aspart  0-5 Units Subcutaneous QHS  . insulin glargine  8 Units Subcutaneous QHS  . liver oil-zinc oxide   Topical QID  . loratadine  10 mg Oral Daily  . magnesium citrate  1 Bottle Oral Once  . nystatin   Topical BID  . pravastatin  40 mg Oral Daily  . risperiDONE  0.5 mg Oral BID   Continuous Infusions:  PRN Meds:.acetaminophen **OR** [DISCONTINUED] acetaminophen, albuterol, azelastine **AND** fluticasone, bisacodyl, [DISCONTINUED] ondansetron **OR** ondansetron (ZOFRAN) IV, polyethylene glycol, sodium phosphate, traMADol   PHYSICAL EXAM: Vital signs: Vitals:   04/08/18 0924 04/08/18 1416 04/08/18 2121 04/09/18 0503  BP: 127/84 (!) 124/49 (!) 112/51 (!) 122/52  Pulse:  90 87 71  Resp:  18 17 15   Temp:  98 F (36.7 C) 98.4 F (36.9 C) 98.2 F (36.8 C)  TempSrc:  Oral Oral Oral  SpO2:  94% 95% 96%  Weight:      Height:       Filed Weights   03/30/18 1756  Weight: 130 kg   Body mass index is 54.15 kg/m.   Exam  Awake Alert, Oriented X 3, No new F.N deficits, Normal affect [place].AT,PERRAL Supple Neck,No JVD, No cervical lymphadenopathy appriciated.  Symmetrical Chest wall movement, Good air movement bilaterally, CTAB RRR,No Gallops, Rubs or new Murmurs, No Parasternal Heave +ve B.Sounds, Abd Soft, No tenderness, No organomegaly appriciated, No rebound - guarding or rigidity. No Cyanosis, Clubbing or edema, L. Shoulder skin wound under bandage, no surrounding cellulitis   I have personally reviewed following labs and imaging studies  LABORATORY DATA: CBC: Recent Labs  Lab 04/03/18 1020 04/04/18 0429 04/09/18 0438  WBC 9.1 5.8 5.6  NEUTROABS 6.7  --   --   HGB 10.5* 9.3* 10.9*  HCT 33.9* 29.7* 34.0*  MCV 98.0 96.7 96.6  PLT 133* 132* 811    Basic Metabolic Panel: Recent Labs  Lab 04/03/18 1020 04/04/18 0429  04/05/18 0407 04/06/18 0356 04/09/18 0438  NA 138 139 139 140 140  K 3.5 3.3* 3.8 3.6 3.8  CL 103 104 106 110 107  CO2 25 25 25 25 23   GLUCOSE 165* 139* 119* 75 68*  BUN 11 15 19 22 19   CREATININE 0.80 1.00 0.83 0.70 0.66  CALCIUM 8.3* 7.7* 7.6* 7.9* 8.6*    GFR: Estimated Creatinine Clearance: 84.6 mL/min (by C-G formula based on SCr of 0.66 mg/dL).  Liver Function Tests: Recent Labs  Lab 04/03/18 1020  AST 37  ALT 41  ALKPHOS 448*  BILITOT 1.2  PROT 5.3*  ALBUMIN 2.1*   No results for input(s): LIPASE, AMYLASE in the last 168 hours. No results for input(s): AMMONIA in the last 168 hours.  Coagulation Profile: No results for input(s): INR, PROTIME in the last 168 hours.  Cardiac Enzymes: No results for input(s):  CKTOTAL, CKMB, CKMBINDEX, TROPONINI in the last 168 hours.  BNP (last 3 results) No results for input(s): PROBNP in the last 8760 hours.  HbA1C: No results for input(s): HGBA1C in the last 72 hours.  CBG: Recent Labs  Lab 04/08/18 0839 04/08/18 1213 04/08/18 1715 04/08/18 2118 04/09/18 0845  GLUCAP 106* 168* 181* 455* 75    Lipid Profile: No results for input(s): CHOL, HDL, LDLCALC, TRIG, CHOLHDL, LDLDIRECT in the last 72 hours.  Thyroid Function Tests: No results for input(s): TSH, T4TOTAL, FREET4, T3FREE, THYROIDAB in the last 72 hours.  Anemia Panel: No results for input(s): VITAMINB12, FOLATE, FERRITIN, TIBC, IRON, RETICCTPCT in the last 72 hours.  Urine analysis:    Component Value Date/Time   COLORURINE STRAW (A) 03/30/2018 1933   APPEARANCEUR CLEAR 03/30/2018 1933   LABSPEC 1.016 03/30/2018 1933   PHURINE 7.0 03/30/2018 1933   GLUCOSEU >=500 (A) 03/30/2018 1933   HGBUR NEGATIVE 03/30/2018 1933   BILIRUBINUR NEGATIVE 03/30/2018 1933   KETONESUR 20 (A) 03/30/2018 1933   PROTEINUR NEGATIVE 03/30/2018 1933   UROBILINOGEN 0.2 12/27/2010 1858   NITRITE NEGATIVE 03/30/2018 1933   LEUKOCYTESUR NEGATIVE 03/30/2018 1933    Sepsis  Labs: Lactic Acid, Venous    Component Value Date/Time   LATICACIDVEN 1.0 04/03/2018 1020    MICROBIOLOGY: Recent Results (from the past 240 hour(s))  Culture, blood (routine x 2)     Status: None   Collection Time: 04/03/18 10:20 AM  Result Value Ref Range Status   Specimen Description BLOOD LEFT WRIST  Final   Special Requests   Final    BOTTLES DRAWN AEROBIC AND ANAEROBIC Blood Culture adequate volume Performed at [facility], [place] [address], [place], [place] [address]    Culture NO GROWTH 5 DAYS  Final   Report Status 04/08/2018 FINAL  Final  Culture, blood (routine x 2)     Status: None   Collection Time: 04/03/18 10:22 AM  Result Value Ref Range Status   Specimen Description BLOOD RIGHT WRIST  Final   Special Requests   Final    BOTTLES DRAWN AEROBIC AND ANAEROBIC Blood Culture results may not be optimal due to an inadequate volume of blood received in culture bottles Performed at [facility], [place] [address] [place], [place] [address]    Culture NO GROWTH 5 DAYS  Final   Report Status 04/08/2018 FINAL  Final    RADIOLOGY STUDIES/RESULTS: Dg Chest 2 View  Result Date: 03/30/2018 CLINICAL DATA:  Fall with fatigue. EXAM: CHEST - 2 VIEW COMPARISON:  03/18/2018 FINDINGS: 1835 hours. Low volumes. The cardio pericardial silhouette is enlarged. The lungs are clear without focal pneumonia, edema, pneumothorax or pleural effusion. The visualized bony structures of the thorax are intact. Degenerative changes again noted left shoulder. Telemetry leads overlie the chest. IMPRESSION: Interval improvement in basilar aeration. Low volumes without acute cardiopulmonary findings on today's study. Electronically Signed   By: Misty Stanley M.D.   On: 03/30/2018 18:54   Ct Head Wo Contrast  Result Date: 03/30/2018 CLINICAL DATA:  Fall, weakness, fatigue EXAM: CT HEAD WITHOUT CONTRAST TECHNIQUE: Contiguous axial images were obtained from the base of the skull through the vertex  without intravenous contrast. COMPARISON:  02/28/2018 FINDINGS: Brain: No acute intracranial abnormality. Specifically, no hemorrhage, hydrocephalus, mass lesion, acute infarction, or significant intracranial injury. Vascular: No hyperdense vessel or unexpected calcification. Skull: No acute calvarial abnormality. Sinuses/Orbits: Visualized paranasal sinuses and mastoids clear. Orbital soft tissues unremarkable. Other: None IMPRESSION: No  acute intracranial abnormality. Electronically Signed   By: Rolm Baptise M.D.   On: 03/30/2018 18:45   Dg Chest Portable 1 View  Result Date: 04/03/2018 CLINICAL DATA:  Fevers EXAM: PORTABLE CHEST 1 VIEW COMPARISON:  03/30/2018 FINDINGS: Cardiac shadow is stable. Aortic calcifications are again seen. The lungs are well aerated bilaterally. Mild right basilar atelectasis is noted. No sizable effusion is seen. No acute bony abnormality is noted. IMPRESSION: Mild right basilar atelectasis. Electronically Signed   By: Inez Catalina M.D.   On: 04/03/2018 09:28   Dg Chest Port 1 View  Result Date: 03/18/2018 CLINICAL DATA:  Wheezing EXAM: PORTABLE CHEST 1 VIEW COMPARISON:  03/14/2018 FINDINGS: Cardiomegaly. Vascular congestion. Right lower lobe airspace opacity with small to moderate right effusion. No confluent opacity or effusion on the left. No acute bony abnormality. IMPRESSION: Cardiomegaly, vascular congestion. Small to moderate right pleural effusion with right lower lobe atelectasis or pneumonia. Electronically Signed   By: Rolm Baptise M.D.   On: 03/18/2018 20:14   Dg Chest Port 1 View  Result Date: 03/14/2018 CLINICAL DATA:  Shortness of breath EXAM: PORTABLE CHEST 1 VIEW COMPARISON:  03/05/2018 FINDINGS: There is bibasilar atelectasis. There is elevation of the right diaphragm. There is no focal consolidation. There is no pleural effusion or pneumothorax. The heart and mediastinal contours are unremarkable. The osseous structures are unremarkable. IMPRESSION: No  active disease. Electronically Signed   By: Kathreen Devoid   On: 03/14/2018 20:25   US Abdomen Limited Ruq  Result Date: 03/16/2018 CLINICAL DATA:  Elevated LFTs EXAM: ULTRASOUND ABDOMEN LIMITED RIGHT UPPER QUADRANT COMPARISON:  None. FINDINGS: Gallbladder: No gallstones or wall thickening visualized. No sonographic Murphy sign noted by sonographer. Common bile duct: Diameter: 4 mm Liver: Increased in echogenicity. No focal lesion identified portal vein is patent on color Doppler imaging with normal direction of blood flow towards the liver. IMPRESSION: Hepatic steatosis. No cholelithiasis or sonographic evidence for acute cholecystitis. Electronically Signed   By: Lovey Newcomer M.D.   On: 03/16/2018 20:25     Signature  Lala Lund M.D on 04/09/2018 at 8:57 AM   -  To page go to [email or web address]       LOS: 6 days

## 2018-04-10 DIAGNOSIS — T148XXA Other injury of unspecified body region, initial encounter: Secondary | ICD-10-CM | POA: Diagnosis not present

## 2018-04-10 DIAGNOSIS — L089 Local infection of the skin and subcutaneous tissue, unspecified: Secondary | ICD-10-CM | POA: Diagnosis not present

## 2018-04-10 LAB — GLUCOSE, CAPILLARY
Glucose-Capillary: 106 mg/dL — ABNORMAL HIGH (ref 70–99)
Glucose-Capillary: 214 mg/dL — ABNORMAL HIGH (ref 70–99)
Glucose-Capillary: 149 mg/dL — ABNORMAL HIGH (ref 70–99)
Glucose-Capillary: 185 mg/dL — ABNORMAL HIGH (ref 70–99)

## 2018-04-10 NOTE — Progress Notes (Signed)
Occupational Therapy Treatment Patient Details Name: Heather Petty MRN: [id number] DOB: August 07, 1950 Today's Date: 04/10/2018    History of present illness Patient is 68 y/o female presenting to hospital due to inability to care for self at home. Patient recently d/c from SNF. PMH includes AKI, asthma, DMII, HTN, schizophrenia, morbid obesity, dermatitis, and chronic sacral wounds.   OT comments  Pt progressing towards acute OT goals. Focus of session was bed mobility and sitting EOB. Pt remains needing +2 assist for transfers OOB.   Follow Up Recommendations  SNF    Equipment Recommendations  Other (comment)(defer to next venue) If pt declines SNF and d/c to home then recommend [facility] (sit>stand) lift equipment and maximizing HH services   Recommendations for Other Services      Precautions / Restrictions Precautions Precautions: Fall Restrictions Weight Bearing Restrictions: No       Mobility Bed Mobility Overal bed mobility: Needs Assistance Bed Mobility: Supine to Sit;Rolling;Sit to Sidelying Rolling: Mod assist   Supine to sit: Mod assist;Max assist   Sit to sidelying: Mod assist;Max assist General bed mobility comments: Pt able to scoot BLE across bed and reach for support to powerup into sitting position. Heavy use of bed pad to facilitate pivoting hips to full EOB position. Sit>sidelying>roll to return to supine with assist to advance BLE up onto bed.   Transfers                 General transfer comment: unable this session, only +1 assist available     Balance Overall balance assessment: Needs assistance Sitting-balance support: Bilateral upper extremity supported Sitting balance-Leahy Scale: Good Sitting balance - Comments: Patient able to maintain sitting EOB without LOB                                   ADL either performed or assessed with clinical judgement   ADL Overall ADL's : Needs assistance/impaired                                        General ADL Comments: Pt completed bed mobility and sat EOB about 5 minutes.      Vision       Perception     Praxis      Cognition Arousal/Alertness: Awake/alert Behavior During Therapy: WFL for tasks assessed/performed;Anxious Overall Cognitive Status: Within Functional Limits for tasks assessed                                 General Comments: Answering questions appropriately. No family present to confirm accuracy but overall matches with chart review.        Exercises     Shoulder Instructions       General Comments      Pertinent Vitals/ Pain       Pain Assessment: Faces Faces Pain Scale: Hurts little more Pain Location: unspecified this session Pain Descriptors / Indicators: Aching Pain Intervention(s): Monitored during session;Repositioned  Home Living                                          Prior Functioning/Environment  Frequency  Min 2X/week        Progress Toward Goals  OT Goals(current goals can now be found in the care plan section)  Progress towards OT goals: Progressing toward goals  Acute Rehab OT Goals Patient Stated Goal: to get out of hospital OT Goal Formulation: With patient Time For Goal Achievement: 04/19/18 Potential to Achieve Goals: Good  Plan Discharge plan remains appropriate    Co-evaluation                 AM-PAC OT "6 Clicks" Daily Activity     Outcome Measure   Help from another person eating meals?: None Help from another person taking care of personal grooming?: A Little Help from another person toileting, which includes using toliet, bedpan, or urinal?: A Lot Help from another person bathing (including washing, rinsing, drying)?: A Lot Help from another person to put on and taking off regular upper body clothing?: A Lot Help from another person to put on and taking off regular lower body clothing?: A Lot 6 Click Score: 15     End of Session    OT Visit Diagnosis: Unsteadiness on feet (R26.81);Other abnormalities of gait and mobility (R26.89);Muscle weakness (generalized) (M62.81);History of falling (Z91.81);Pain   Activity Tolerance Patient tolerated treatment well;Patient limited by fatigue   Patient Left in bed;with call bell/phone within reach   Nurse Communication          Time: 0045-9977 OT Time Calculation (min): 21 min  Charges: OT General Charges $OT Visit: 1 Visit OT Treatments $Self Care/Home Management : 8-22 mins  Tyrone Schimke, OT Acute Rehabilitation Services Pager: [phone] Office: [phone]    Hortencia Pilar 04/10/2018, 12:07 PM

## 2018-04-10 NOTE — Progress Notes (Signed)
PROGRESS NOTE        PATIENT DETAILS Name: Heather Petty Age: 68 y.o. Sex: female Date of Birth: 12-25-1950 Admit Date: 03/30/2018 Admitting Physician Karmen Bongo, MD CXK:[id number], Christean Grief, MD  Brief Narrative: Patient is a 68 y.o. female with history of morbid obesity, schizophrenia, hypertension, dyslipidemia, DM, bedbound status (nonambulatory for 10 years) presented to the ED on 2/22 with weakness and falls.  She was being observed in the ED while awaiting placement, she subsequently developed a fever and was then admitted to the hospitalist service.  See below for further details  Subjective:  Patient in bed, appears comfortable, today she has mild generalized headache as she did not sleep well last night, no fever, no chest pain or pressure, no shortness of breath , no abdominal pain. No focal weakness. .   Assessment/Plan:  Fever: Left shoulder skin breakdown/ulcer, chronic present on admission, no purulence, continue wound care, previous MD had discussed with ID who recommended Keflex which will be continued.  All cultures are negative.  Continue to monitor.     Acute gouty flare involving left foot: Markedly better-minimal tenderness - now better with NSAIDs and Colchicine.  Hypertension: Controlled-continue Norvasc and Lotensin  Dyslipidemia:Continue statin  Peripheral neuropathy:Likely related to diabetes, continue Neurontin  Schizophrenia:Appears to be stable-continue risperidone and doxepin  Chronically elevated alkaline phosphatase:Suspect secondary to bedbound status/osseous issue rather than liver issue.  Left arm chronic full-thickness wound, unstageable sacral pressure injury, type II full-thickness wound to bilateral buttocks: All present prior to admission-appreciate wound care evaluation  Debility/deconditioning/bedbound status for past 10 years - SNF  Mild generalized headache due to lack of sleep on 04/10/2018.   Tylenol.    Morbid obesity - follow with PCP for weight loss  Insulin-dependent DM-2:  Sugars are extremely labile currently on Lantus, adjusted sliding scale for better control.,  Usually a.m. sugars running low hence will drop Lantus dose and add nighttime sliding scale.  CBG (last 3)  Recent Labs    04/09/18 1720 04/09/18 2116 04/10/18 0825  GLUCAP 145* 211* 106*   Lab Results  Component Value Date   HGBA1C 6.7 (H) 12/18/2017     DVT Prophylaxis: Prophylactic Lovenox   Code Status: Full code   Family Communication: None at bedside  Disposition Plan: Remain inpatient-SNF on discharge-awaiting insurance authorization  Antimicrobial agents: Anti-infectives (From admission, onward)   Start     Dose/Rate Route Frequency Ordered Stop   04/05/18 0945  cephALEXin (KEFLEX) capsule 500 mg     500 mg Oral Every 8 hours 04/05/18 0943     04/05/18 0000  cephALEXin (KEFLEX) 500 MG capsule     500 mg Oral Every 8 hours 04/05/18 1014 04/10/18 2359   04/03/18 1900  ceFAZolin (ANCEF) IVPB 2g/100 mL premix  Status:  Discontinued     2 g 200 mL/hr over 30 Minutes Intravenous Every 8 hours 04/03/18 1136 04/05/18 0943   04/03/18 1045  ceFAZolin (ANCEF) IVPB 2g/100 mL premix     2 g 200 mL/hr over 30 Minutes Intravenous  Once 04/03/18 1042 04/03/18 1128      Procedures: None  CONSULTS:  None  Time spent: 15- minutes-Greater than 50% of this time was spent in counseling, explanation of diagnosis, planning of further management, and coordination of care.  MEDICATIONS: Scheduled Meds: . allopurinol  300 mg Oral Daily  . amLODipine  10 mg Oral Daily  . antiseptic oral rinse  15 mL Mouth Rinse TID  . B-complex with vitamin C  1 tablet Oral Daily  . benazepril  20 mg Oral Daily  . cephALEXin  500 mg Oral Q8H  . collagenase   Topical Daily  . doxepin  25 mg Oral BID  . enoxaparin (LOVENOX) injection  65 mg Subcutaneous Q24H  . famotidine  20 mg Oral QHS  . feeding  supplement (PRO-STAT SUGAR FREE 64)  30 mL Oral TID WC  . folic acid  1 mg Oral Daily  . furosemide  20 mg Oral Daily  . gabapentin  300 mg Oral TID  . hydrocerin  1 application Topical BID  . hydrOXYzine  25 mg Oral TID  . insulin aspart  0-15 Units Subcutaneous TID WC  . insulin aspart  0-5 Units Subcutaneous QHS  . insulin glargine  7 Units Subcutaneous Daily  . liver oil-zinc oxide   Topical QID  . loratadine  10 mg Oral Daily  . magnesium citrate  1 Bottle Oral Once  . nystatin   Topical BID  . pravastatin  40 mg Oral Daily  . risperiDONE  0.5 mg Oral BID   Continuous Infusions:  PRN Meds:.acetaminophen **OR** [DISCONTINUED] acetaminophen, albuterol, azelastine **AND** fluticasone, bisacodyl, [DISCONTINUED] ondansetron **OR** ondansetron (ZOFRAN) IV, polyethylene glycol, sodium phosphate, traMADol   PHYSICAL EXAM: Vital signs: Vitals:   04/09/18 0503 04/09/18 1420 04/09/18 2116 04/10/18 0552  BP: (!) 122/52 (!) 129/51 (!) 120/52 (!) 103/48  Pulse: 71 85 83 79  Resp: 15 15 14 16   Temp: 98.2 F (36.8 C) 98 F (36.7 C) 98 F (36.7 C) 98.1 F (36.7 C)  TempSrc: Oral Oral Oral Oral  SpO2: 96% 98% 95% 94%  Weight:      Height:       Filed Weights   03/30/18 1756  Weight: 130 kg   Body mass index is 54.15 kg/m.   Exam  Awake Alert, Oriented X 3, No new F.N deficits, Normal affect [place].AT,PERRAL Supple Neck,No JVD, No cervical lymphadenopathy appriciated.  Symmetrical Chest wall movement, Good air movement bilaterally, CTAB RRR,No Gallops, Rubs or new Murmurs, No Parasternal Heave +ve B.Sounds, Abd Soft, No tenderness, No organomegaly appriciated, No rebound - guarding or rigidity. No Cyanosis, Clubbing or edema, L. Shoulder skin wound under bandage, no surrounding cellulitis   I have personally reviewed following labs and imaging studies  LABORATORY DATA: CBC: Recent Labs  Lab 04/03/18 1020 04/04/18 0429 04/09/18 0438  WBC 9.1 5.8 5.6  NEUTROABS 6.7  --    --   HGB 10.5* 9.3* 10.9*  HCT 33.9* 29.7* 34.0*  MCV 98.0 96.7 96.6  PLT 133* 132* 557    Basic Metabolic Panel: Recent Labs  Lab 04/03/18 1020 04/04/18 0429 04/05/18 0407 04/06/18 0356 04/09/18 0438  NA 138 139 139 140 140  K 3.5 3.3* 3.8 3.6 3.8  CL 103 104 106 110 107  CO2 25 25 25 25 23   GLUCOSE 165* 139* 119* 75 68*  BUN 11 15 19 22 19   CREATININE 0.80 1.00 0.83 0.70 0.66  CALCIUM 8.3* 7.7* 7.6* 7.9* 8.6*    GFR: Estimated Creatinine Clearance: 84.6 mL/min (by C-G formula based on SCr of 0.66 mg/dL).  Liver Function Tests: Recent Labs  Lab 04/03/18 1020  AST 37  ALT 41  ALKPHOS 448*  BILITOT 1.2  PROT 5.3*  ALBUMIN 2.1*   No results for input(s): LIPASE, AMYLASE in the last 168  hours. No results for input(s): AMMONIA in the last 168 hours.  Coagulation Profile: No results for input(s): INR, PROTIME in the last 168 hours.  Cardiac Enzymes: No results for input(s): CKTOTAL, CKMB, CKMBINDEX, TROPONINI in the last 168 hours.  BNP (last 3 results) No results for input(s): PROBNP in the last 8760 hours.  HbA1C: No results for input(s): HGBA1C in the last 72 hours.  CBG: Recent Labs  Lab 04/09/18 0845 04/09/18 1243 04/09/18 1720 04/09/18 2116 04/10/18 0825  GLUCAP 75 156* 145* 211* 106*    Lipid Profile: No results for input(s): CHOL, HDL, LDLCALC, TRIG, CHOLHDL, LDLDIRECT in the last 72 hours.  Thyroid Function Tests: No results for input(s): TSH, T4TOTAL, FREET4, T3FREE, THYROIDAB in the last 72 hours.  Anemia Panel: No results for input(s): VITAMINB12, FOLATE, FERRITIN, TIBC, IRON, RETICCTPCT in the last 72 hours.  Urine analysis:    Component Value Date/Time   COLORURINE STRAW (A) 03/30/2018 1933   APPEARANCEUR CLEAR 03/30/2018 1933   LABSPEC 1.016 03/30/2018 1933   PHURINE 7.0 03/30/2018 1933   GLUCOSEU >=500 (A) 03/30/2018 1933   HGBUR NEGATIVE 03/30/2018 1933   BILIRUBINUR NEGATIVE 03/30/2018 1933   KETONESUR 20 (A) 03/30/2018  1933   PROTEINUR NEGATIVE 03/30/2018 1933   UROBILINOGEN 0.2 12/27/2010 1858   NITRITE NEGATIVE 03/30/2018 1933   LEUKOCYTESUR NEGATIVE 03/30/2018 1933    Sepsis Labs: Lactic Acid, Venous    Component Value Date/Time   LATICACIDVEN 1.0 04/03/2018 1020    MICROBIOLOGY: Recent Results (from the past 240 hour(s))  Culture, blood (routine x 2)     Status: None   Collection Time: 04/03/18 10:20 AM  Result Value Ref Range Status   Specimen Description BLOOD LEFT WRIST  Final   Special Requests   Final    BOTTLES DRAWN AEROBIC AND ANAEROBIC Blood Culture adequate volume Performed at [facility], [place] [address], [place], [place] [address]    Culture NO GROWTH 5 DAYS  Final   Report Status 04/08/2018 FINAL  Final  Culture, blood (routine x 2)     Status: None   Collection Time: 04/03/18 10:22 AM  Result Value Ref Range Status   Specimen Description BLOOD RIGHT WRIST  Final   Special Requests   Final    BOTTLES DRAWN AEROBIC AND ANAEROBIC Blood Culture results may not be optimal due to an inadequate volume of blood received in culture bottles Performed at [facility], [place] [address], [place], [place] [address]    Culture NO GROWTH 5 DAYS  Final   Report Status 04/08/2018 FINAL  Final    RADIOLOGY STUDIES/RESULTS: Dg Chest 2 View  Result Date: 03/30/2018 CLINICAL DATA:  Fall with fatigue. EXAM: CHEST - 2 VIEW COMPARISON:  03/18/2018 FINDINGS: 1835 hours. Low volumes. The cardio pericardial silhouette is enlarged. The lungs are clear without focal pneumonia, edema, pneumothorax or pleural effusion. The visualized bony structures of the thorax are intact. Degenerative changes again noted left shoulder. Telemetry leads overlie the chest. IMPRESSION: Interval improvement in basilar aeration. Low volumes without acute cardiopulmonary findings on today's study. Electronically Signed   By: Misty Stanley M.D.   On: 03/30/2018 18:54   Ct Head Wo Contrast  Result Date:  03/30/2018 CLINICAL DATA:  Fall, weakness, fatigue EXAM: CT HEAD WITHOUT CONTRAST TECHNIQUE: Contiguous axial images were obtained from the base of the skull through the vertex without intravenous contrast. COMPARISON:  02/28/2018 FINDINGS: Brain: No acute intracranial abnormality. Specifically, no hemorrhage, hydrocephalus, mass lesion, acute  infarction, or significant intracranial injury. Vascular: No hyperdense vessel or unexpected calcification. Skull: No acute calvarial abnormality. Sinuses/Orbits: Visualized paranasal sinuses and mastoids clear. Orbital soft tissues unremarkable. Other: None IMPRESSION: No acute intracranial abnormality. Electronically Signed   By: Rolm Baptise M.D.   On: 03/30/2018 18:45   Dg Chest Portable 1 View  Result Date: 04/03/2018 CLINICAL DATA:  Fevers EXAM: PORTABLE CHEST 1 VIEW COMPARISON:  03/30/2018 FINDINGS: Cardiac shadow is stable. Aortic calcifications are again seen. The lungs are well aerated bilaterally. Mild right basilar atelectasis is noted. No sizable effusion is seen. No acute bony abnormality is noted. IMPRESSION: Mild right basilar atelectasis. Electronically Signed   By: Inez Catalina M.D.   On: 04/03/2018 09:28   Dg Chest Port 1 View  Result Date: 03/18/2018 CLINICAL DATA:  Wheezing EXAM: PORTABLE CHEST 1 VIEW COMPARISON:  03/14/2018 FINDINGS: Cardiomegaly. Vascular congestion. Right lower lobe airspace opacity with small to moderate right effusion. No confluent opacity or effusion on the left. No acute bony abnormality. IMPRESSION: Cardiomegaly, vascular congestion. Small to moderate right pleural effusion with right lower lobe atelectasis or pneumonia. Electronically Signed   By: Rolm Baptise M.D.   On: 03/18/2018 20:14   Dg Chest Port 1 View  Result Date: 03/14/2018 CLINICAL DATA:  Shortness of breath EXAM: PORTABLE CHEST 1 VIEW COMPARISON:  03/05/2018 FINDINGS: There is bibasilar atelectasis. There is elevation of the right diaphragm. There is no  focal consolidation. There is no pleural effusion or pneumothorax. The heart and mediastinal contours are unremarkable. The osseous structures are unremarkable. IMPRESSION: No active disease. Electronically Signed   By: Kathreen Devoid   On: 03/14/2018 20:25   US Abdomen Limited Ruq  Result Date: 03/16/2018 CLINICAL DATA:  Elevated LFTs EXAM: ULTRASOUND ABDOMEN LIMITED RIGHT UPPER QUADRANT COMPARISON:  None. FINDINGS: Gallbladder: No gallstones or wall thickening visualized. No sonographic Murphy sign noted by sonographer. Common bile duct: Diameter: 4 mm Liver: Increased in echogenicity. No focal lesion identified portal vein is patent on color Doppler imaging with normal direction of blood flow towards the liver. IMPRESSION: Hepatic steatosis. No cholelithiasis or sonographic evidence for acute cholecystitis. Electronically Signed   By: Lovey Newcomer M.D.   On: 03/16/2018 20:25     Signature  Lala Lund M.D on 04/10/2018 at 8:36 AM   -  To page go to [email or web address]       LOS: 7 days

## 2018-04-10 NOTE — Progress Notes (Signed)
NCM called daughter, Margreta [phone]) to discuss d/cplan. Per MD pt is medically ready to transition to next level of care SNF vs home with home health services. Voice message left. NCM awaiting call back from daughter. Whitman Hero RN,BSN,CM

## 2018-04-11 DIAGNOSIS — T148XXA Other injury of unspecified body region, initial encounter: Secondary | ICD-10-CM | POA: Diagnosis not present

## 2018-04-11 DIAGNOSIS — L089 Local infection of the skin and subcutaneous tissue, unspecified: Secondary | ICD-10-CM | POA: Diagnosis not present

## 2018-04-11 LAB — GLUCOSE, CAPILLARY
Glucose-Capillary: 128 mg/dL — ABNORMAL HIGH (ref 70–99)
Glucose-Capillary: 195 mg/dL — ABNORMAL HIGH (ref 70–99)
Glucose-Capillary: 219 mg/dL — ABNORMAL HIGH (ref 70–99)
Glucose-Capillary: 135 mg/dL — ABNORMAL HIGH (ref 70–99)

## 2018-04-11 NOTE — Progress Notes (Signed)
PROGRESS NOTE        PATIENT DETAILS Name: Heather Petty Age: 68 y.o. Sex: female Date of Birth: 10-29-1950 Admit Date: 03/30/2018 Admitting Physician Karmen Bongo, MD [id number], Christean Grief, MD  Brief Narrative: Patient is a 68 y.o. female with history of morbid obesity, schizophrenia, hypertension, dyslipidemia, DM, bedbound status (nonambulatory for 10 years) presented to the ED on 2/22 with weakness and falls.  She was being observed in the ED while awaiting placement, she subsequently developed a fever and was then admitted to the hospitalist service.  See below for further details  Subjective:  Patient in bed, appears comfortable, denies any headache, no fever, no chest pain or pressure, no shortness of breath , no abdominal pain. No focal weakness.  Assessment/Plan:  Fever: Left shoulder skin breakdown/ulcer, chronic present on admission, no purulence, continue wound care, previous MD had discussed with ID who recommended Keflex which will be continued.  All cultures are negative.  Continue to monitor.     Acute gouty flare involving left foot: Markedly better-minimal tenderness - now better with NSAIDs and Colchicine.  Hypertension: Controlled-continue Norvasc and Lotensin  Dyslipidemia:Continue statin  Peripheral neuropathy:Likely related to diabetes, continue Neurontin  Schizophrenia:Appears to be stable-continue risperidone and doxepin  Chronically elevated alkaline phosphatase:Suspect secondary to bedbound status/osseous issue rather than liver issue.  Left arm chronic full-thickness wound, unstageable sacral pressure injury, type II full-thickness wound to bilateral buttocks: All present prior to admission-appreciate wound care evaluation  Debility/deconditioning/bedbound status for past 10 years - SNF  Mild generalized headache due to lack of sleep on 04/10/2018.  Tylenol.    Morbid obesity - follow with PCP for weight  loss  Insulin-dependent DM-2:  Sugars are extremely labile currently on Lantus, adjusted sliding scale for better control.,  Usually a.m. sugars running low hence will drop Lantus dose and add nighttime sliding scale.  CBG (last 3)  Recent Labs    04/10/18 1733 04/10/18 2126 04/11/18 0830  GLUCAP 149* 185* 128*   Lab Results  Component Value Date   HGBA1C 6.7 (H) 12/18/2017     DVT Prophylaxis: Prophylactic Lovenox   Code Status: Full code   Family Communication: None at bedside  Disposition Plan: Remain inpatient-SNF on discharge-awaiting insurance authorization  Antimicrobial agents: Anti-infectives (From admission, onward)   Start     Dose/Rate Route Frequency Ordered Stop   04/05/18 0945  cephALEXin (KEFLEX) capsule 500 mg     500 mg Oral Every 8 hours 04/05/18 0943     04/05/18 0000  cephALEXin (KEFLEX) 500 MG capsule     500 mg Oral Every 8 hours 04/05/18 1014 04/10/18 2359   04/03/18 1900  ceFAZolin (ANCEF) IVPB 2g/100 mL premix  Status:  Discontinued     2 g 200 mL/hr over 30 Minutes Intravenous Every 8 hours 04/03/18 1136 04/05/18 0943   04/03/18 1045  ceFAZolin (ANCEF) IVPB 2g/100 mL premix     2 g 200 mL/hr over 30 Minutes Intravenous  Once 04/03/18 1042 04/03/18 1128      Procedures: None  CONSULTS:  None  Time spent: 15- minutes-Greater than 50% of this time was spent in counseling, explanation of diagnosis, planning of further management, and coordination of care.  MEDICATIONS: Scheduled Meds: . allopurinol  300 mg Oral Daily  . amLODipine  10 mg Oral Daily  . antiseptic oral rinse  15 mL Mouth  Rinse TID  . B-complex with vitamin C  1 tablet Oral Daily  . benazepril  20 mg Oral Daily  . cephALEXin  500 mg Oral Q8H  . collagenase   Topical Daily  . doxepin  25 mg Oral BID  . enoxaparin (LOVENOX) injection  65 mg Subcutaneous Q24H  . famotidine  20 mg Oral QHS  . feeding supplement (PRO-STAT SUGAR FREE 64)  30 mL Oral TID WC  . folic  acid  1 mg Oral Daily  . furosemide  20 mg Oral Daily  . gabapentin  300 mg Oral TID  . hydrocerin  1 application Topical BID  . hydrOXYzine  25 mg Oral TID  . insulin aspart  0-15 Units Subcutaneous TID WC  . insulin aspart  0-5 Units Subcutaneous QHS  . insulin glargine  7 Units Subcutaneous Daily  . liver oil-zinc oxide   Topical QID  . loratadine  10 mg Oral Daily  . magnesium citrate  1 Bottle Oral Once  . nystatin   Topical BID  . pravastatin  40 mg Oral Daily  . risperiDONE  0.5 mg Oral BID   Continuous Infusions:  PRN Meds:.acetaminophen **OR** [DISCONTINUED] acetaminophen, albuterol, azelastine **AND** fluticasone, bisacodyl, [DISCONTINUED] ondansetron **OR** ondansetron (ZOFRAN) IV, polyethylene glycol, sodium phosphate, traMADol   PHYSICAL EXAM: Vital signs: Vitals:   04/10/18 0552 04/10/18 1308 04/10/18 2126 04/11/18 0548  BP: (!) 103/48 (!) 126/56 (!) 127/49 (!) 121/47  Pulse: 79 92 87 72  Resp: 16 20 18 18   Temp: 98.1 F (36.7 C) 98.5 F (36.9 C) 98 F (36.7 C) 98.3 F (36.8 C)  TempSrc: Oral Oral Oral Oral  SpO2: 94% 97% 94% 100%  Weight:      Height:       Filed Weights   03/30/18 1756  Weight: 130 kg   Body mass index is 54.15 kg/m.   Exam  Awake Alert, Oriented X 3, No new F.N deficits, Normal affect [place].AT,PERRAL Supple Neck,No JVD, No cervical lymphadenopathy appriciated.  Symmetrical Chest wall movement, Good air movement bilaterally, CTAB RRR,No Gallops, Rubs or new Murmurs, No Parasternal Heave +ve B.Sounds, Abd Soft, No tenderness, No organomegaly appriciated, No rebound - guarding or rigidity. No Cyanosis, Clubbing or edema, L. Shoulder skin wound under bandage, no surrounding cellulitis   I have personally reviewed following labs and imaging studies  LABORATORY DATA: CBC: Recent Labs  Lab 04/09/18 0438  WBC 5.6  HGB 10.9*  HCT 34.0*  MCV 96.6  PLT 850    Basic Metabolic Panel: Recent Labs  Lab 04/05/18 0407  04/06/18 0356 04/09/18 0438  NA 139 140 140  K 3.8 3.6 3.8  CL 106 110 107  CO2 25 25 23   GLUCOSE 119* 75 68*  BUN 19 22 19   CREATININE 0.83 0.70 0.66  CALCIUM 7.6* 7.9* 8.6*    GFR: Estimated Creatinine Clearance: 84.6 mL/min (by C-G formula based on SCr of 0.66 mg/dL).  Liver Function Tests: No results for input(s): AST, ALT, ALKPHOS, BILITOT, PROT, ALBUMIN in the last 168 hours. No results for input(s): LIPASE, AMYLASE in the last 168 hours. No results for input(s): AMMONIA in the last 168 hours.  Coagulation Profile: No results for input(s): INR, PROTIME in the last 168 hours.  Cardiac Enzymes: No results for input(s): CKTOTAL, CKMB, CKMBINDEX, TROPONINI in the last 168 hours.  BNP (last 3 results) No results for input(s): PROBNP in the last 8760 hours.  HbA1C: No results for input(s): HGBA1C in the last 72  hours.  CBG: Recent Labs  Lab 04/10/18 0825 04/10/18 1306 04/10/18 1733 04/10/18 2126 04/11/18 0830  GLUCAP 106* 214* 149* 185* 128*    Lipid Profile: No results for input(s): CHOL, HDL, LDLCALC, TRIG, CHOLHDL, LDLDIRECT in the last 72 hours.  Thyroid Function Tests: No results for input(s): TSH, T4TOTAL, FREET4, T3FREE, THYROIDAB in the last 72 hours.  Anemia Panel: No results for input(s): VITAMINB12, FOLATE, FERRITIN, TIBC, IRON, RETICCTPCT in the last 72 hours.  Urine analysis:    Component Value Date/Time   COLORURINE STRAW (A) 03/30/2018 1933   APPEARANCEUR CLEAR 03/30/2018 1933   LABSPEC 1.016 03/30/2018 1933   PHURINE 7.0 03/30/2018 1933   GLUCOSEU >=500 (A) 03/30/2018 1933   HGBUR NEGATIVE 03/30/2018 1933   BILIRUBINUR NEGATIVE 03/30/2018 1933   KETONESUR 20 (A) 03/30/2018 1933   PROTEINUR NEGATIVE 03/30/2018 1933   UROBILINOGEN 0.2 12/27/2010 1858   NITRITE NEGATIVE 03/30/2018 1933   LEUKOCYTESUR NEGATIVE 03/30/2018 1933    Sepsis Labs: Lactic Acid, Venous    Component Value Date/Time   LATICACIDVEN 1.0 04/03/2018 1020     MICROBIOLOGY: Recent Results (from the past 240 hour(s))  Culture, blood (routine x 2)     Status: None   Collection Time: 04/03/18 10:20 AM  Result Value Ref Range Status   Specimen Description BLOOD LEFT WRIST  Final   Special Requests   Final    BOTTLES DRAWN AEROBIC AND ANAEROBIC Blood Culture adequate volume Performed at [facility], [place] [address] [place], [place] [address]    Culture NO GROWTH 5 DAYS  Final   Report Status 04/08/2018 FINAL  Final  Culture, blood (routine x 2)     Status: None   Collection Time: 04/03/18 10:22 AM  Result Value Ref Range Status   Specimen Description BLOOD RIGHT WRIST  Final   Special Requests   Final    BOTTLES DRAWN AEROBIC AND ANAEROBIC Blood Culture results may not be optimal due to an inadequate volume of blood received in culture bottles Performed at [facility], [place] [address], [place], [place] [address]    Culture NO GROWTH 5 DAYS  Final   Report Status 04/08/2018 FINAL  Final    RADIOLOGY STUDIES/RESULTS: Dg Chest 2 View  Result Date: 03/30/2018 CLINICAL DATA:  Fall with fatigue. EXAM: CHEST - 2 VIEW COMPARISON:  03/18/2018 FINDINGS: 1835 hours. Low volumes. The cardio pericardial silhouette is enlarged. The lungs are clear without focal pneumonia, edema, pneumothorax or pleural effusion. The visualized bony structures of the thorax are intact. Degenerative changes again noted left shoulder. Telemetry leads overlie the chest. IMPRESSION: Interval improvement in basilar aeration. Low volumes without acute cardiopulmonary findings on today's study. Electronically Signed   By: Misty Stanley M.D.   On: 03/30/2018 18:54   Ct Head Wo Contrast  Result Date: 03/30/2018 CLINICAL DATA:  Fall, weakness, fatigue EXAM: CT HEAD WITHOUT CONTRAST TECHNIQUE: Contiguous axial images were obtained from the base of the skull through the vertex without intravenous contrast. COMPARISON:  02/28/2018 FINDINGS: Brain: No acute intracranial  abnormality. Specifically, no hemorrhage, hydrocephalus, mass lesion, acute infarction, or significant intracranial injury. Vascular: No hyperdense vessel or unexpected calcification. Skull: No acute calvarial abnormality. Sinuses/Orbits: Visualized paranasal sinuses and mastoids clear. Orbital soft tissues unremarkable. Other: None IMPRESSION: No acute intracranial abnormality. Electronically Signed   By: Rolm Baptise M.D.   On: 03/30/2018 18:45   Dg Chest Portable 1 View  Result Date: 04/03/2018 CLINICAL DATA:  Fevers EXAM: PORTABLE CHEST  1 VIEW COMPARISON:  03/30/2018 FINDINGS: Cardiac shadow is stable. Aortic calcifications are again seen. The lungs are well aerated bilaterally. Mild right basilar atelectasis is noted. No sizable effusion is seen. No acute bony abnormality is noted. IMPRESSION: Mild right basilar atelectasis. Electronically Signed   By: Inez Catalina M.D.   On: 04/03/2018 09:28   Dg Chest Port 1 View  Result Date: 03/18/2018 CLINICAL DATA:  Wheezing EXAM: PORTABLE CHEST 1 VIEW COMPARISON:  03/14/2018 FINDINGS: Cardiomegaly. Vascular congestion. Right lower lobe airspace opacity with small to moderate right effusion. No confluent opacity or effusion on the left. No acute bony abnormality. IMPRESSION: Cardiomegaly, vascular congestion. Small to moderate right pleural effusion with right lower lobe atelectasis or pneumonia. Electronically Signed   By: Rolm Baptise M.D.   On: 03/18/2018 20:14   Dg Chest Port 1 View  Result Date: 03/14/2018 CLINICAL DATA:  Shortness of breath EXAM: PORTABLE CHEST 1 VIEW COMPARISON:  03/05/2018 FINDINGS: There is bibasilar atelectasis. There is elevation of the right diaphragm. There is no focal consolidation. There is no pleural effusion or pneumothorax. The heart and mediastinal contours are unremarkable. The osseous structures are unremarkable. IMPRESSION: No active disease. Electronically Signed   By: Kathreen Devoid   On: 03/14/2018 20:25   US Abdomen  Limited Ruq  Result Date: 03/16/2018 CLINICAL DATA:  Elevated LFTs EXAM: ULTRASOUND ABDOMEN LIMITED RIGHT UPPER QUADRANT COMPARISON:  None. FINDINGS: Gallbladder: No gallstones or wall thickening visualized. No sonographic Murphy sign noted by sonographer. Common bile duct: Diameter: 4 mm Liver: Increased in echogenicity. No focal lesion identified portal vein is patent on color Doppler imaging with normal direction of blood flow towards the liver. IMPRESSION: Hepatic steatosis. No cholelithiasis or sonographic evidence for acute cholecystitis. Electronically Signed   By: Lovey Newcomer M.D.   On: 03/16/2018 20:25     Signature  Lala Lund M.D on 04/11/2018 at 9:46 AM   -  To page go to [email or web address]       LOS: 8 days

## 2018-04-11 NOTE — Progress Notes (Signed)
Physical Therapy Treatment Patient Details Name: Heather Petty MRN: [id number] DOB: [birth year] Today's Date: 04/11/2018    History of Present Illness Pt is a 68 y.o. female admitted 03/30/18 with weakness, falls and inability to care for self. While being observed in ED awaiting SNF placement, pt developed fever. L shoulder skin breakdown, but cultures negative. Also with L foot gout flare. Of note, recent d/c from SNF. PMH includes asthma, DM2, HTN, schizophrenia, morbid obesity, chronic sacral wounds.   PT Comments    Pt slowly progressing with mobility. Able to sit and stand to RW with modA+1. Pt able to tolerate ~10 sec trials of standing at a time; limited by c/o of L foot pain upon weight bearing. Pt unaware of urine incontinence, dependent for pericare. Continue to recommend SNF-level therapies.   Follow Up Recommendations  SNF;Supervision/Assistance - 24 hour     Equipment Recommendations  None recommended by PT    Recommendations for Other Services       Precautions / Restrictions Precautions Precautions: Fall Precaution Comments: L foot pain (pt attributes to gout) Restrictions Weight Bearing Restrictions: No    Mobility  Bed Mobility Overal bed mobility: Needs Assistance Bed Mobility: Supine to Sit;Sit to Supine     Supine to sit: Mod assist;HOB elevated Sit to supine: Mod assist   General bed mobility comments: Pt able to scoot BLEs to EOB and reach for hand rail, requiring modA for trunk elevation to achieve upright. [place] for BLEs to return to supine  Transfers Overall transfer level: Needs assistance Equipment used: Rolling walker (2 wheeled) Transfers: Sit to/from Stand Sit to Stand: Mod assist         General transfer comment: Performed 2x sit<>stand this session, modA for trunk elevation, heavy reliance on UE support to push into standing. Pt with c/o significant foot pain upon standing, only able to tolerate standing ~10sec/trial with max  encouragement  Ambulation/Gait                 Stairs             Wheelchair Mobility    Modified Rankin (Stroke Patients Only)       Balance Overall balance assessment: Needs assistance Sitting-balance support: Bilateral upper extremity supported Sitting balance-Leahy Scale: Good       Standing balance-Leahy Scale: Poor Standing balance comment: Heavy reliance on UE support                            Cognition Arousal/Alertness: Awake/alert Behavior During Therapy: WFL for tasks assessed/performed;Anxious Overall Cognitive Status: Within Functional Limits for tasks assessed                                 General Comments: WFL for simple tasks and conversations, following commands appropriately; poor insight into deficits, likely baseline cognition      Exercises      General Comments General comments (skin integrity, edema, etc.): Pt with purewick but bed pad saturated with urine; RN present and aware. Also c/o increased irritation at LUE bandage site, RN aware      Pertinent Vitals/Pain Pain Assessment: Faces Faces Pain Scale: Hurts even more Pain Location: L foot with weightbearing Pain Descriptors / Indicators: Grimacing;Guarding;Moaning Pain Intervention(s): Limited activity within patient's tolerance;Monitored during session    Home Living  Prior Function            PT Goals (current goals can now be found in the care plan section) Acute Rehab PT Goals Patient Stated Goal: to get out of hospital PT Goal Formulation: With patient Time For Goal Achievement: 04/16/18 Potential to Achieve Goals: Fair Progress towards PT goals: Progressing toward goals    Frequency    Min 2X/week      PT Plan Current plan remains appropriate    Co-evaluation              AM-PAC PT "6 Clicks" Mobility   Outcome Measure  Help needed turning from your back to your side while in a flat  bed without using bedrails?: A Little Help needed moving from lying on your back to sitting on the side of a flat bed without using bedrails?: A Lot Help needed moving to and from a bed to a chair (including a wheelchair)?: A Lot Help needed standing up from a chair using your arms (e.g., wheelchair or bedside chair)?: A Lot Help needed to walk in hospital room?: Total Help needed climbing 3-5 steps with a railing? : Total 6 Click Score: 11    End of Session Equipment Utilized During Treatment: Gait belt Activity Tolerance: Patient limited by pain Patient left: in bed;with call bell/phone within reach;with nursing/sitter in room Nurse Communication: Mobility status PT Visit Diagnosis: Other abnormalities of gait and mobility (R26.89);Muscle weakness (generalized) (M62.81);Pain;History of falling (Z91.81) Pain - Right/Left: Left Pain - part of body: Ankle and joints of foot     Time: 5003-7048 PT Time Calculation (min) (ACUTE ONLY): 18 min  Charges:  $Therapeutic Activity: 8-22 mins                    Mabeline Caras, PT, DPT Acute Rehabilitation Services  Pager [phone] Office [place] 04/11/2018, 5:24 PM

## 2018-04-11 NOTE — Progress Notes (Signed)
Patient's daughter is in contact with the SNF liaison to produce requested financial information.   Percell Locus Aleese Kamps LCSW [phone]

## 2018-04-12 DIAGNOSIS — L089 Local infection of the skin and subcutaneous tissue, unspecified: Secondary | ICD-10-CM | POA: Diagnosis not present

## 2018-04-12 DIAGNOSIS — T148XXA Other injury of unspecified body region, initial encounter: Secondary | ICD-10-CM | POA: Diagnosis not present

## 2018-04-12 LAB — GLUCOSE, CAPILLARY
Glucose-Capillary: 116 mg/dL — ABNORMAL HIGH (ref 70–99)
Glucose-Capillary: 162 mg/dL — ABNORMAL HIGH (ref 70–99)
Glucose-Capillary: 222 mg/dL — ABNORMAL HIGH (ref 70–99)

## 2018-04-12 MED ORDER — CEPHALEXIN 500 MG PO CAPS: 500.0000 mg | ORAL_CAPSULE | Freq: Three times a day (TID) | ORAL | 0 refills | Status: AC

## 2018-04-12 MED ORDER — INSULIN ASPART 100 UNIT/ML ~~LOC~~ SOLN: 0.0000 [IU] | Freq: Three times a day (TID) | SUBCUTANEOUS | 11 refills | Status: DC

## 2018-04-12 MED ORDER — INSULIN GLARGINE 100 UNIT/ML ~~LOC~~ SOLN: 7.0000 [IU] | Freq: Every day | SUBCUTANEOUS | 11 refills | Status: DC

## 2018-04-12 NOTE — Progress Notes (Signed)
12pm-RN was able to get daughter on the phone. She states she will be back home and can check her email to do the paperwork. CSW gave her a 3pm deadline.   9am-CSW left patient's daughter a voicemail. SNF needs her to email back signed admission paperwork before they can accept patient.   Percell Locus Jahmar Mckelvy LCSW [phone]

## 2018-04-12 NOTE — Progress Notes (Signed)
OT Cancellation Note  Patient Details Name: Heather Petty MRN: [id number] DOB: Aug 04, 1950   Cancelled Treatment:    Reason Eval/Treat Not Completed: Other (comment). Pt declined, fatigued and about to eat lunch. Will reattempt as able.   Tyrone Schimke, OT Acute Rehabilitation Services Pager: [phone] Office: [phone]  04/12/2018, 12:56 PM

## 2018-04-12 NOTE — Progress Notes (Signed)
Patient's daughter states she is running late and is heading to her computer to print and sign the documents.   Percell Locus Cheyanne Lamison LCSW [phone]

## 2018-04-12 NOTE — Progress Notes (Signed)
PROGRESS NOTE        PATIENT DETAILS Name: Heather Petty Age: 68 y.o. Sex: female Date of Birth: 1950-11-23 Admit Date: 03/30/2018 Admitting Physician Karmen Bongo, MD GBE:[id number], Christean Grief, MD  Brief Narrative: Patient is a 68 y.o. female with history of morbid obesity, schizophrenia, hypertension, dyslipidemia, DM, bedbound status (nonambulatory for 10 years) presented to the ED on 2/22 with weakness and falls.  She was being observed in the ED while awaiting placement, she subsequently developed a fever and was then admitted to the hospitalist service.  See below for further details  Subjective:  Patient in bed, appears comfortable, denies any headache, no fever, no chest pain or pressure, no shortness of breath , no abdominal pain. No focal weakness.  Assessment/Plan:  Fever: Left shoulder skin breakdown/ulcer, chronic present on admission, no purulence, continue wound care, previous MD had discussed with ID who recommended Keflex which will be continued.  All cultures are negative.  Continue to monitor.     Acute gouty flare involving left foot: Markedly better-minimal tenderness - now better with NSAIDs and Colchicine.  Hypertension: Controlled-continue Norvasc and Lotensin  Dyslipidemia:Continue statin  Peripheral neuropathy:Likely related to diabetes, continue Neurontin  Schizophrenia:Appears to be stable-continue risperidone and doxepin  Chronically elevated alkaline phosphatase:Suspect secondary to bedbound status/osseous issue rather than liver issue.  Left arm chronic full-thickness wound, unstageable sacral pressure injury, type II full-thickness wound to bilateral buttocks: All present prior to admission-appreciate wound care evaluation  Debility/deconditioning/bedbound status for past 10 years - SNF  Mild generalized headache due to lack of sleep on 04/10/2018.  Tylenol.    Morbid obesity - follow with PCP for weight  loss  Insulin-dependent DM-2:  Sugars are extremely labile currently on Lantus, adjusted sliding scale for better control.,  Usually a.m. sugars running low hence will drop Lantus dose and add nighttime sliding scale.  CBG (last 3)  Recent Labs    04/11/18 1232 04/11/18 1745 04/11/18 2112  GLUCAP 195* 219* 135*   Lab Results  Component Value Date   HGBA1C 6.7 (H) 12/18/2017     DVT Prophylaxis: Prophylactic Lovenox   Code Status: Full code   Family Communication: None at bedside  Disposition Plan: Remain inpatient-SNF on discharge-awaiting insurance authorization  Antimicrobial agents: Anti-infectives (From admission, onward)   Start     Dose/Rate Route Frequency Ordered Stop   04/05/18 0945  cephALEXin (KEFLEX) capsule 500 mg     500 mg Oral Every 8 hours 04/05/18 0943     04/05/18 0000  cephALEXin (KEFLEX) 500 MG capsule     500 mg Oral Every 8 hours 04/05/18 1014 04/10/18 2359   04/03/18 1900  ceFAZolin (ANCEF) IVPB 2g/100 mL premix  Status:  Discontinued     2 g 200 mL/hr over 30 Minutes Intravenous Every 8 hours 04/03/18 1136 04/05/18 0943   04/03/18 1045  ceFAZolin (ANCEF) IVPB 2g/100 mL premix     2 g 200 mL/hr over 30 Minutes Intravenous  Once 04/03/18 1042 04/03/18 1128      Procedures: None  CONSULTS:  None  Time spent: 15- minutes-Greater than 50% of this time was spent in counseling, explanation of diagnosis, planning of further management, and coordination of care.  MEDICATIONS: Scheduled Meds: . allopurinol  300 mg Oral Daily  . amLODipine  10 mg Oral Daily  . antiseptic oral rinse  15 mL Mouth  Rinse TID  . B-complex with vitamin C  1 tablet Oral Daily  . benazepril  20 mg Oral Daily  . cephALEXin  500 mg Oral Q8H  . collagenase   Topical Daily  . doxepin  25 mg Oral BID  . enoxaparin (LOVENOX) injection  65 mg Subcutaneous Q24H  . famotidine  20 mg Oral QHS  . feeding supplement (PRO-STAT SUGAR FREE 64)  30 mL Oral TID WC  . folic  acid  1 mg Oral Daily  . furosemide  20 mg Oral Daily  . gabapentin  300 mg Oral TID  . hydrocerin  1 application Topical BID  . hydrOXYzine  25 mg Oral TID  . insulin aspart  0-15 Units Subcutaneous TID WC  . insulin aspart  0-5 Units Subcutaneous QHS  . insulin glargine  7 Units Subcutaneous Daily  . liver oil-zinc oxide   Topical QID  . loratadine  10 mg Oral Daily  . magnesium citrate  1 Bottle Oral Once  . nystatin   Topical BID  . pravastatin  40 mg Oral Daily  . risperiDONE  0.5 mg Oral BID   Continuous Infusions:  PRN Meds:.acetaminophen **OR** [DISCONTINUED] acetaminophen, albuterol, azelastine **AND** fluticasone, bisacodyl, [DISCONTINUED] ondansetron **OR** ondansetron (ZOFRAN) IV, polyethylene glycol, sodium phosphate, traMADol   PHYSICAL EXAM: Vital signs: Vitals:   04/11/18 0835 04/11/18 1546 04/11/18 2100 04/12/18 0515  BP: (!) 128/55 (!) 120/48 (!) 122/52 (!) 123/55  Pulse: 76 70 74 79  Resp: (!) 22 19 18 16   Temp: 98.6 F (37 C) 98.6 F (37 C) 98.7 F (37.1 C) 98.2 F (36.8 C)  TempSrc: Oral Oral Oral Oral  SpO2: 100% 97% 98% 96%  Weight:      Height:       Filed Weights   03/30/18 1756  Weight: 130 kg   Body mass index is 54.15 kg/m.   Exam  Awake Alert, Oriented X 3, No new F.N deficits, Normal affect [place].AT,PERRAL Supple Neck,No JVD, No cervical lymphadenopathy appriciated.  Symmetrical Chest wall movement, Good air movement bilaterally, CTAB RRR,No Gallops, Rubs or new Murmurs, No Parasternal Heave +ve B.Sounds, Abd Soft, No tenderness, No organomegaly appriciated, No rebound - guarding or rigidity. No Cyanosis, Clubbing or edema, L. Shoulder skin wound under bandage, no surrounding cellulitis   I have personally reviewed following labs and imaging studies  LABORATORY DATA: CBC: Recent Labs  Lab 04/09/18 0438  WBC 5.6  HGB 10.9*  HCT 34.0*  MCV 96.6  PLT 272    Basic Metabolic Panel: Recent Labs  Lab 04/06/18 0356  04/09/18 0438  NA 140 140  K 3.6 3.8  CL 110 107  CO2 25 23  GLUCOSE 75 68*  BUN 22 19  CREATININE 0.70 0.66  CALCIUM 7.9* 8.6*    GFR: Estimated Creatinine Clearance: 84.6 mL/min (by C-G formula based on SCr of 0.66 mg/dL).  Liver Function Tests: No results for input(s): AST, ALT, ALKPHOS, BILITOT, PROT, ALBUMIN in the last 168 hours. No results for input(s): LIPASE, AMYLASE in the last 168 hours. No results for input(s): AMMONIA in the last 168 hours.  Coagulation Profile: No results for input(s): INR, PROTIME in the last 168 hours.  Cardiac Enzymes: No results for input(s): CKTOTAL, CKMB, CKMBINDEX, TROPONINI in the last 168 hours.  BNP (last 3 results) No results for input(s): PROBNP in the last 8760 hours.  HbA1C: No results for input(s): HGBA1C in the last 72 hours.  CBG: Recent Labs  Lab 04/10/18 2126  04/11/18 0830 04/11/18 1232 04/11/18 1745 04/11/18 2112  GLUCAP 185* 128* 195* 219* 135*    Lipid Profile: No results for input(s): CHOL, HDL, LDLCALC, TRIG, CHOLHDL, LDLDIRECT in the last 72 hours.  Thyroid Function Tests: No results for input(s): TSH, T4TOTAL, FREET4, T3FREE, THYROIDAB in the last 72 hours.  Anemia Panel: No results for input(s): VITAMINB12, FOLATE, FERRITIN, TIBC, IRON, RETICCTPCT in the last 72 hours.  Urine analysis:    Component Value Date/Time   COLORURINE STRAW (A) 03/30/2018 1933   APPEARANCEUR CLEAR 03/30/2018 1933   LABSPEC 1.016 03/30/2018 1933   PHURINE 7.0 03/30/2018 1933   GLUCOSEU >=500 (A) 03/30/2018 1933   HGBUR NEGATIVE 03/30/2018 1933   BILIRUBINUR NEGATIVE 03/30/2018 1933   KETONESUR 20 (A) 03/30/2018 1933   PROTEINUR NEGATIVE 03/30/2018 1933   UROBILINOGEN 0.2 12/27/2010 1858   NITRITE NEGATIVE 03/30/2018 1933   LEUKOCYTESUR NEGATIVE 03/30/2018 1933    Sepsis Labs: Lactic Acid, Venous    Component Value Date/Time   LATICACIDVEN 1.0 04/03/2018 1020    MICROBIOLOGY: Recent Results (from the past 240  hour(s))  Culture, blood (routine x 2)     Status: None   Collection Time: 04/03/18 10:20 AM  Result Value Ref Range Status   Specimen Description BLOOD LEFT WRIST  Final   Special Requests   Final    BOTTLES DRAWN AEROBIC AND ANAEROBIC Blood Culture adequate volume Performed at [facility], [place] [address], [place], [place] [address]    Culture NO GROWTH 5 DAYS  Final   Report Status 04/08/2018 FINAL  Final  Culture, blood (routine x 2)     Status: None   Collection Time: 04/03/18 10:22 AM  Result Value Ref Range Status   Specimen Description BLOOD RIGHT WRIST  Final   Special Requests   Final    BOTTLES DRAWN AEROBIC AND ANAEROBIC Blood Culture results may not be optimal due to an inadequate volume of blood received in culture bottles Performed at [facility], [place] [address] [place], [place] [address]    Culture NO GROWTH 5 DAYS  Final   Report Status 04/08/2018 FINAL  Final    RADIOLOGY STUDIES/RESULTS: Dg Chest 2 View  Result Date: 03/30/2018 CLINICAL DATA:  Fall with fatigue. EXAM: CHEST - 2 VIEW COMPARISON:  03/18/2018 FINDINGS: 1835 hours. Low volumes. The cardio pericardial silhouette is enlarged. The lungs are clear without focal pneumonia, edema, pneumothorax or pleural effusion. The visualized bony structures of the thorax are intact. Degenerative changes again noted left shoulder. Telemetry leads overlie the chest. IMPRESSION: Interval improvement in basilar aeration. Low volumes without acute cardiopulmonary findings on today's study. Electronically Signed   By: Misty Stanley M.D.   On: 03/30/2018 18:54   Ct Head Wo Contrast  Result Date: 03/30/2018 CLINICAL DATA:  Fall, weakness, fatigue EXAM: CT HEAD WITHOUT CONTRAST TECHNIQUE: Contiguous axial images were obtained from the base of the skull through the vertex without intravenous contrast. COMPARISON:  02/28/2018 FINDINGS: Brain: No acute intracranial abnormality. Specifically, no hemorrhage, hydrocephalus,  mass lesion, acute infarction, or significant intracranial injury. Vascular: No hyperdense vessel or unexpected calcification. Skull: No acute calvarial abnormality. Sinuses/Orbits: Visualized paranasal sinuses and mastoids clear. Orbital soft tissues unremarkable. Other: None IMPRESSION: No acute intracranial abnormality. Electronically Signed   By: Rolm Baptise M.D.   On: 03/30/2018 18:45   Dg Chest Portable 1 View  Result Date: 04/03/2018 CLINICAL DATA:  Fevers EXAM: PORTABLE CHEST 1 VIEW COMPARISON:  03/30/2018 FINDINGS: Cardiac shadow is  stable. Aortic calcifications are again seen. The lungs are well aerated bilaterally. Mild right basilar atelectasis is noted. No sizable effusion is seen. No acute bony abnormality is noted. IMPRESSION: Mild right basilar atelectasis. Electronically Signed   By: Inez Catalina M.D.   On: 04/03/2018 09:28   Dg Chest Port 1 View  Result Date: 03/18/2018 CLINICAL DATA:  Wheezing EXAM: PORTABLE CHEST 1 VIEW COMPARISON:  03/14/2018 FINDINGS: Cardiomegaly. Vascular congestion. Right lower lobe airspace opacity with small to moderate right effusion. No confluent opacity or effusion on the left. No acute bony abnormality. IMPRESSION: Cardiomegaly, vascular congestion. Small to moderate right pleural effusion with right lower lobe atelectasis or pneumonia. Electronically Signed   By: Rolm Baptise M.D.   On: 03/18/2018 20:14   Dg Chest Port 1 View  Result Date: 03/14/2018 CLINICAL DATA:  Shortness of breath EXAM: PORTABLE CHEST 1 VIEW COMPARISON:  03/05/2018 FINDINGS: There is bibasilar atelectasis. There is elevation of the right diaphragm. There is no focal consolidation. There is no pleural effusion or pneumothorax. The heart and mediastinal contours are unremarkable. The osseous structures are unremarkable. IMPRESSION: No active disease. Electronically Signed   By: Kathreen Devoid   On: 03/14/2018 20:25   US Abdomen Limited Ruq  Result Date: 03/16/2018 CLINICAL DATA:   Elevated LFTs EXAM: ULTRASOUND ABDOMEN LIMITED RIGHT UPPER QUADRANT COMPARISON:  None. FINDINGS: Gallbladder: No gallstones or wall thickening visualized. No sonographic Murphy sign noted by sonographer. Common bile duct: Diameter: 4 mm Liver: Increased in echogenicity. No focal lesion identified portal vein is patent on color Doppler imaging with normal direction of blood flow towards the liver. IMPRESSION: Hepatic steatosis. No cholelithiasis or sonographic evidence for acute cholecystitis. Electronically Signed   By: Lovey Newcomer M.D.   On: 03/16/2018 20:25     Signature  Lala Lund M.D on 04/12/2018 at 8:17 AM   -  To page go to [email or web address]       LOS: 9 days

## 2018-04-12 NOTE — Progress Notes (Signed)
Patient will DC to: [facility]  Anticipated DC date: 04/12/18 Family notified: Daughter, Darrick Meigs Transport by: Corey Harold will be called when daughter sends admission paperwork to facility. If she does not do so in a timely manner before the admissions office is gone for the day, patient will have to discharge in the morning.    Per MD patient ready for DC to [facility]. RN, patient, patient's family, and facility notified of DC. Discharge Summary and FL2 sent to facility. RN to call report prior to discharge [phone] Room 108). DC packet on chart. Ambulance transport requested for patient.   CSW will sign off for now as social work intervention is no longer needed. Please consult [place] again if new needs arise.  Cedric Fishman, LCSW Clinical Social Worker [phone]

## 2018-05-07 ENCOUNTER — Emergency Department (HOSPITAL_COMMUNITY): Payer: Medicare Other

## 2018-05-07 ENCOUNTER — Emergency Department (HOSPITAL_COMMUNITY)
Admission: EM | Admit: 2018-05-07 | Discharge: 2018-05-07 | Disposition: A | Discharge status: Home / Self Care | Payer: Medicare Other | Attending: Emergency Medicine | Admitting: Emergency Medicine

## 2018-05-07 ENCOUNTER — Encounter (HOSPITAL_COMMUNITY): Payer: Self-pay | Admitting: Emergency Medicine

## 2018-05-07 DIAGNOSIS — Y93E1 Activity, personal bathing and showering: Secondary | ICD-10-CM | POA: Insufficient documentation

## 2018-05-07 DIAGNOSIS — Y92121 Bathroom in nursing home as the place of occurrence of the external cause: Secondary | ICD-10-CM | POA: Diagnosis not present

## 2018-05-07 DIAGNOSIS — J45909 Unspecified asthma, uncomplicated: Secondary | ICD-10-CM | POA: Diagnosis not present

## 2018-05-07 DIAGNOSIS — E1122 Type 2 diabetes mellitus with diabetic chronic kidney disease: Secondary | ICD-10-CM | POA: Insufficient documentation

## 2018-05-07 DIAGNOSIS — Z87891 Personal history of nicotine dependence: Secondary | ICD-10-CM | POA: Insufficient documentation

## 2018-05-07 DIAGNOSIS — Z794 Long term (current) use of insulin: Secondary | ICD-10-CM | POA: Diagnosis not present

## 2018-05-07 DIAGNOSIS — I129 Hypertensive chronic kidney disease with stage 1 through stage 4 chronic kidney disease, or unspecified chronic kidney disease: Secondary | ICD-10-CM | POA: Diagnosis not present

## 2018-05-07 DIAGNOSIS — Y998 Other external cause status: Secondary | ICD-10-CM | POA: Insufficient documentation

## 2018-05-07 DIAGNOSIS — Z79899 Other long term (current) drug therapy: Secondary | ICD-10-CM | POA: Insufficient documentation

## 2018-05-07 DIAGNOSIS — Z8673 Personal history of transient ischemic attack (TIA), and cerebral infarction without residual deficits: Secondary | ICD-10-CM | POA: Diagnosis not present

## 2018-05-07 DIAGNOSIS — N183 Chronic kidney disease, stage 3 (moderate): Secondary | ICD-10-CM | POA: Insufficient documentation

## 2018-05-07 DIAGNOSIS — E7800 Pure hypercholesterolemia, unspecified: Secondary | ICD-10-CM | POA: Diagnosis not present

## 2018-05-07 DIAGNOSIS — W19XXXA Unspecified fall, initial encounter: Secondary | ICD-10-CM

## 2018-05-07 DIAGNOSIS — W182XXA Fall in (into) shower or empty bathtub, initial encounter: Secondary | ICD-10-CM | POA: Diagnosis not present

## 2018-05-07 DIAGNOSIS — S0990XA Unspecified injury of head, initial encounter: Secondary | ICD-10-CM | POA: Diagnosis present

## 2018-05-07 HISTORY — DX: Cerebral infarction, unspecified: I63.9

## 2018-05-07 LAB — BASIC METABOLIC PANEL
ANION GAP: 13 (ref 5–15)
BUN: 8 mg/dL (ref 8–23)
CHLORIDE: 107 mmol/L (ref 98–111)
CO2: 20 mmol/L — ABNORMAL LOW (ref 22–32)
Calcium: 9.2 mg/dL (ref 8.9–10.3)
Creatinine, Ser: 0.69 mg/dL (ref 0.44–1.00)
Glucose, Bld: 146 mg/dL — ABNORMAL HIGH (ref 70–99)
Potassium: 4.3 mmol/L (ref 3.5–5.1)
Sodium: 140 mmol/L (ref 135–145)

## 2018-05-07 LAB — CBC WITH DIFFERENTIAL/PLATELET
ABS IMMATURE GRANULOCYTES: 0.02 10*3/uL (ref 0.00–0.07)
Basophils Absolute: 0 10*3/uL (ref 0.0–0.1)
Basophils Relative: 0 %
Eosinophils Absolute: 0.2 10*3/uL (ref 0.0–0.5)
Eosinophils Relative: 4 %
HCT: 37.2 % (ref 36.0–46.0)
HEMOGLOBIN: 12.1 g/dL (ref 12.0–15.0)
Immature Granulocytes: 0 %
Lymphocytes Relative: 20 %
Lymphs Abs: 1.4 10*3/uL (ref 0.7–4.0)
MCH: 31 pg (ref 26.0–34.0)
MCHC: 32.5 g/dL (ref 30.0–36.0)
MCV: 95.4 fL (ref 80.0–100.0)
Monocytes Absolute: 0.5 10*3/uL (ref 0.1–1.0)
Monocytes Relative: 8 %
NEUTROS ABS: 4.7 10*3/uL (ref 1.7–7.7)
NEUTROS PCT: 68 %
Platelets: 186 10*3/uL (ref 150–400)
RBC: 3.9 MIL/uL (ref 3.87–5.11)
RDW: 15.3 % (ref 11.5–15.5)
WBC: 6.9 10*3/uL (ref 4.0–10.5)
nRBC: 0 % (ref 0.0–0.2)

## 2018-05-07 MED ORDER — ACETAMINOPHEN 325 MG PO TABS
650.0000 mg | ORAL_TABLET | Freq: Once | ORAL | Status: AC
  Administered 2018-05-07: 650 mg via ORAL
  Filled 2018-05-07: qty 2

## 2018-05-07 NOTE — ED Notes (Signed)
Discharge instructions discussed with Pt. Pt verbalized understanding. Pt stable and leaving via [facility].  

## 2018-05-07 NOTE — ED Notes (Signed)
Pt transported to xray 

## 2018-05-07 NOTE — ED Provider Notes (Signed)
[facility] EMERGENCY DEPARTMENT Provider Note   CSN: [id number] Arrival date & time: 05/07/18  1201    History   Chief Complaint No chief complaint on file.   HPI Heather Petty is a 68 y.o. female with history of diabetes, hypertension, hyperlipiedmia, CKD 3, schizophrenia presenting to emergency department today with chief complaint of fall.  Fall happened just prior to arrival.  Patient is currently staying in a SNF and she states she was sitting in the shower chair drying herself off and is unsure what caused her to fall.  She states she landed on her left shoulder and thinks she hit her head.  Patient states she has pain in her neck, left shoulder, left elbow that she describes as constant aching.  She rates the pain 8 out of 10 in severity.  Patient did not take anything for pain prior to arrival.  Patient does not think she passed out.  The fall was witnessed by a CNA who was able to help her back up.  Patient thinks she slipped out of the chair. Patient is wheelchair-bound and has been for 10 years.  Patient is not on any blood thinners.  Patient denies chest pain, shortness of breath, cough, urinary symptoms.    Patient denies any recent travel. Pt denies known contact with confirmed positive person for covid-19.  History provided by patient.   Past Medical History:  Diagnosis Date   AKI (acute kidney injury) ([place]) 12/17/2017   Arthritis    Asthma    Depression    Diabetes mellitus    Gout    High cholesterol    Hypertension    Morbid obesity ([place])    Pneumonia 12/17/2017   Schizophrenia ([place])    Stroke [facility])     Patient Active Problem List   Diagnosis Date Noted   Wound infection 04/03/2018   Morbid obesity with BMI of 50.0-59.9, adult ([place]) 04/03/2018   Elevated alkaline phosphatase level 04/03/2018   Asthma exacerbation 03/14/2018   Hypotension 03/14/2018   Lactic acidosis 03/14/2018   GERD (gastroesophageal reflux  disease) 03/14/2018   Acute renal failure superimposed on stage 3 chronic kidney disease ([place]) 03/14/2018   Macrocytic anemia 03/14/2018   Abnormal LFTs 03/14/2018   Acute kidney injury ([place]) 02/28/2018   Wound of sacral region 02/28/2018   Hypothermia 02/28/2018   Pressure injury of skin 02/06/2018   Acute renal failure (ARF) ([place]) 02/05/2018   Hyperlipidemia associated with type 2 diabetes mellitus ([place]) 02/05/2018   Hyperkalemia 02/05/2018   Dermatitis 02/05/2018   Open back wound 02/05/2018   Dysuria 02/05/2018   Paranoid schizophrenia ([place])    CAP (community acquired pneumonia) 12/17/2017   AKI (acute kidney injury) ([place]) 12/17/2017   Type II diabetes mellitus with renal manifestations ([place]) 12/17/2017   HTN (hypertension) 12/17/2017   Gout     Past Surgical History:  Procedure Laterality Date   CYST EXCISION     DENTAL SURGERY     TONSILLECTOMY       OB History   No obstetric history on file.      Home Medications    Prior to Admission medications   Medication Sig Start Date End Date Taking? Authorizing Provider  acetaminophen (TYLENOL) 325 MG tablet Take 650 mg by mouth 4 (four) times daily as needed for headache (pain).    [provider]  albuterol (VENTOLIN HFA) 108 (90 Base) MCG/ACT inhaler Inhale 2 puffs into the lungs every 4 (four) hours as needed for  wheezing or shortness of breath.    [provider]  allopurinol (ZYLOPRIM) 300 MG tablet Take 300 mg by mouth daily.    [provider]  Amino Acids-Protein Hydrolys (FEEDING SUPPLEMENT, PRO-STAT SUGAR FREE 64,) LIQD Take 30 mLs by mouth 3 (three) times daily with meals.    [provider]  amLODipine (NORVASC) 10 MG tablet Take 10 mg by mouth daily.    [provider]  antiseptic oral rinse (BIOTENE) LIQD 15 mLs by Mouth Rinse route 3 (three) times daily. Swish and spit    [provider]  Azelastine-Fluticasone (DYMISTA) 137-50  MCG/ACT SUSP Place 1 spray into both nostrils 2 (two) times daily as needed (for allergies).     [provider]  b complex vitamins tablet Take 1 tablet by mouth daily.    [provider]  benazepril (LOTENSIN) 20 MG tablet Take 20 mg by mouth daily.    [provider]  bisacodyl (DULCOLAX) 5 MG EC tablet Take 1 tablet (5 mg total) by mouth daily as needed for moderate constipation. Patient taking differently: Take 5 mg by mouth at bedtime.  12/24/17   Lady Deutscher, MD  cetirizine (ZYRTEC) 10 MG tablet Take 10 mg by mouth daily.     [provider]  colchicine 0.6 MG tablet Take 1 tablet (0.6 mg total) by mouth 2 (two) times daily. 04/05/18   Ghimire, Henreitta Leber, MD  collagenase (SANTYL) ointment Apply topically daily. Patient taking differently: Apply 1 application topically See admin instructions. Clean with Dakin's Solution (half strength), apply skin prep periwound. Apply Santyl Ointment to necrotic tissue in wound bed, cover with Calcium Alginate, cover with bordered foam dressing daily. 02/09/18   Elgergawy, Silver Huguenin, MD  Colloidal Oatmeal (EUCERIN ECZEMA RELIEF) 1 % CREA Apply 1 application topically 2 (two) times daily.    [provider]  cyanocobalamin (,VITAMIN B-12,) 1000 MCG/ML injection Inject 1 mL (1,000 mcg total) into the muscle once a week. Patient taking differently: Inject 1,000 mcg into the muscle every Monday.  02/09/18   Elgergawy, Silver Huguenin, MD  doxepin (SINEQUAN) 25 MG capsule Take 25 mg by mouth 2 (two) times daily.     [provider]  famotidine (PEPCID) 20 MG tablet Take 20 mg by mouth at bedtime.     [provider]  folic acid (FOLVITE) 1 MG tablet Take 1 mg by mouth daily.    [provider]  furosemide (LASIX) 20 MG tablet Take 20 mg by mouth daily.    [provider]  gabapentin (NEURONTIN) 300 MG capsule Take 300 mg by mouth 3 (three) times daily.    [provider]    hydrocortisone 2.5 % cream Apply 1 application topically See admin instructions. Apply topically to rash on face twice daily    [provider]  hydrOXYzine (ATARAX/VISTARIL) 25 MG tablet Take 25 mg by mouth 3 (three) times daily.    [provider]  insulin aspart (NOVOLOG) 100 UNIT/ML injection Take subcu QA CHS, 140-199 - 2 units, 200-250 - 6 units, 251-299 - 8 units,  300-349 - 10 units,  350 or above 14 units. Patient taking differently: Inject 0-12 Units into the skin See admin instructions. Inject 0-12 units into the skin 4 times a day (before meals and at bedtime) per sliding scale: BGL <60 or >400  = CALL MD; 60-200 = 0 units; 201-250 = 2 units; 251-300 = 4 units; 301-350 = 6 units; 351-400 = 8 units;  401-450 = 10 units; >450 = 12 units 03/05/18   Thurnell Lose, MD  insulin aspart (NOVOLOG) 100 UNIT/ML injection Inject 0-15 Units into the skin 3 (three) times daily with meals. 04/12/18   Thurnell Lose, MD  insulin glargine (LANTUS) 100 UNIT/ML injection Inject 0.07 mLs (7 Units total) into the skin daily. 04/13/18   Thurnell Lose, MD  insulin glargine (LANTUS) 100 UNIT/ML injection Inject 0.07 mLs (7 Units total) into the skin at bedtime. 04/12/18   Thurnell Lose, MD  ipratropium-albuterol (DUONEB) 0.5-2.5 (3) MG/3ML SOLN Take 3 mLs by nebulization every 6 (six) hours as needed (for shortness of breath or wheezing).     [provider]  Multiple Vitamins-Minerals (THERA-M MULTIPLE VITAMINS PO) Take 1 tablet by mouth daily.    [provider]  nystatin (MYCOSTATIN/NYSTOP) powder Apply topically See admin instructions. Apply topically twice daily -  between folds, beneath breasts, bilateral axillae, and under pannus    [provider]  polyethylene glycol (MIRALAX / GLYCOLAX) packet Take 17 g by mouth 2 (two) times daily. Patient taking differently: Take 17 g by mouth daily as needed for mild constipation.  12/24/17   Lady Deutscher, MD   pravastatin (PRAVACHOL) 40 MG tablet Take 40 mg by mouth daily.    [provider]  risperiDONE (RISPERDAL) 0.5 MG tablet Take 1 tablet (0.5 mg total) by mouth 2 (two) times daily. 12/23/17 04/28/18  Purohit, Konrad Dolores, MD  silver sulfADIAZINE (SILVADENE) 1 % cream Apply topically 2 (two) times daily. 02/09/18   Elgergawy, Silver Huguenin, MD  traMADol (ULTRAM) 50 MG tablet Take 50 mg by mouth 4 (four) times daily as needed (pain).     [provider]  triamcinolone (KENALOG) 0.147 MG/GM topical spray Apply 1 spray topically See admin instructions. Apply topically to head in the direction of hair growth daily at bedtime    [provider]  vitamin B-6 (PYRIDOXINE) 25 MG tablet Take 25 mg by mouth daily.    [provider]  vitamin C (ASCORBIC ACID) 500 MG tablet Take 500 mg by mouth 2 (two) times daily.    [provider]  Vitamin D, Ergocalciferol, (DRISDOL) 1.25 MG (50000 UT) CAPS capsule Take 50,000 Units by mouth every Monday.     [provider]  Zinc Sulfate (ZINC-220 PO) Take 220 mg by mouth at bedtime.     [provider]    Family History Family History  Family history unknown: Yes    Social History Social History   Tobacco Use   Smoking status: Former Smoker   Smokeless tobacco: Never Used  Substance Use Topics   Alcohol use: No   Drug use: No     Allergies   Lasix [furosemide]   Review of Systems Review of Systems  Constitutional: Negative for chills and fever.  HENT: Negative for congestion, ear discharge, ear pain, sinus pressure, sinus pain and sore throat.   Eyes: Negative for pain and redness.  Respiratory: Negative for cough and shortness of breath.   Cardiovascular: Negative for chest pain.  Gastrointestinal: Negative for abdominal pain, constipation, diarrhea, nausea and vomiting.  Genitourinary: Negative for dysuria and hematuria.  Musculoskeletal: Positive for arthralgias and neck pain. Negative for  back pain.  Skin: Positive for wound.  Neurological: Negative for weakness, numbness and headaches.     Physical Exam Updated Vital Signs BP (!) 158/81    Pulse 74    Temp 97.8 F (36.6 C) (Oral)  Resp 17    SpO2 99%   Physical Exam Vitals signs and nursing note reviewed.  Constitutional:      General: She is not in acute distress.    Appearance: She is well-developed. She is obese. She is not ill-appearing or toxic-appearing.     Interventions: Cervical collar in place.  HENT:     Head: Normocephalic and atraumatic.     Comments: No racoon eyes.  Head is nontender to palpation.  No wounds, lacerations, ecchymosis, bleeding noted.    Right Ear: Tympanic membrane and external ear normal.     Left Ear: Tympanic membrane and external ear normal.     Nose: Nose normal.     Mouth/Throat:     Mouth: Mucous membranes are moist.     Pharynx: Oropharynx is clear.  Eyes:     General: No scleral icterus.       Right eye: No discharge.        Left eye: No discharge.     Extraocular Movements: Extraocular movements intact.     Conjunctiva/sclera: Conjunctivae normal.     Pupils: Pupils are equal, round, and reactive to light.  Neck:     Musculoskeletal: Normal range of motion.     Comments: Tender to palpation of midline cervical spine. No bony stepoffs or deformities. Nno paraspinous muscle TTP or muscle spasms. No rigidity or meningeal signs. No bruising, erythema, or swelling.  Cardiovascular:     Rate and Rhythm: Normal rate and regular rhythm.     Pulses: Normal pulses.     Heart sounds: Normal heart sounds.  Pulmonary:     Effort: Pulmonary effort is normal.     Breath sounds: Normal breath sounds.  Abdominal:     General: There is no distension.     Palpations: Abdomen is soft.     Tenderness: There is no abdominal tenderness. There is no guarding or rebound.  Musculoskeletal: Normal range of motion.     Comments: Tender to palpation of left elbow and left shoulder with  decreased ROM secondary to pain.  Normal active range of motion of right and left lower extremity  No tenderness to palpation of spinous processes of thoracic spine, lumbar spine.  No overlying rash, ecchymosis or erythema.  No tenderness to palpation of paraspinous muscles of lumbar spine.  Skin:    General: Skin is warm and dry.     Comments: Stage 1 ulcer on left shoulder without surrounding erythema, purulent discharge, or warmth.  Chronic sacral ulceration without purulent discharge, non tender to palpation.  Neurological:     Mental Status: She is oriented to person, place, and time.     Comments: Speech is clear and goal oriented, follows commands CN III-XII intact, no facial droop Normal strength in bilateral upper and lower extremities. including dorsiflexion and plantar flexion, strong and equal grip strength Sensation normal to light and sharp touch Moves extremities without ataxia, coordination intact Normal finger to nose and rapid alternating movements   Psychiatric:        Behavior: Behavior normal.      ED Treatments / Results  Labs (all labs ordered are listed, but only abnormal results are displayed) Labs Reviewed  BASIC METABOLIC PANEL - Abnormal; Notable for the following components:      Result Value   CO2 20 (*)    Glucose, Bld 146 (*)    All other components within normal limits  CBC WITH DIFFERENTIAL/PLATELET    EKG EKG Interpretation  Date/Time:  Tuesday May 07 2018 12:35:21 EDT Ventricular Rate:  76 PR Interval:    QRS Duration: 87 QT Interval:  408 QTC Calculation: 459 R Axis:   8 Text Interpretation:  Sinus rhythm Low voltage, precordial leads No significant change since last tracing Confirmed by Deno Etienne [phone]) on 05/07/2018 1:03:49 PM   Radiology Dg Elbow Complete Left  Result Date: 05/07/2018 CLINICAL DATA:  Left elbow pain after fall today. EXAM: LEFT ELBOW - COMPLETE 3+ VIEW COMPARISON:  None. FINDINGS: There is no evidence of  fracture, dislocation, or joint effusion. Mild degenerative changes seen involving the coronoid process and proximal humerus. Soft tissues are unremarkable. IMPRESSION: No acute abnormality seen in the left elbow. Degenerative changes as described above. Electronically Signed   By: Marijo Conception, M.D.   On: 05/07/2018 14:44   Dg Wrist Complete Left  Result Date: 05/07/2018 CLINICAL DATA:  LEFT shoulder and wrist pain after falling in the shower EXAM: LEFT WRIST - COMPLETE 3+ VIEW COMPARISON:  None FINDINGS: Osseous demineralization. Radiocarpal joint space narrowing. Remaining joint spaces preserved. Widened scapholunate interval consistent with torn scapholunate ligament, age-indeterminate. No acute fracture, dislocation, or bone destruction. IMPRESSION: Radiocarpal degenerative changes and osseous demineralization. Torn scapholunate ligament, of uncertain acuity. No fracture or dislocation identified. Electronically Signed   By: Lavonia Dana M.D.   On: 05/07/2018 13:22   Ct Head Wo Contrast  Result Date: 05/07/2018 CLINICAL DATA:  Fall in the shower.  Hit the back of the head. EXAM: CT HEAD WITHOUT CONTRAST CT CERVICAL SPINE WITHOUT CONTRAST TECHNIQUE: Multidetector CT imaging of the head and cervical spine was performed following the standard protocol without intravenous contrast. Multiplanar CT image reconstructions of the cervical spine were also generated. COMPARISON:  CT head dated March 30, 2018. FINDINGS: CT HEAD FINDINGS Brain: No evidence of acute infarction, hemorrhage, hydrocephalus, extra-axial collection or mass lesion/mass effect. Cerebral volume is normal for age. Vascular: Calcified atherosclerosis at the skullbase. No hyperdense vessel. Skull: Normal. Negative for fracture or focal lesion. Sinuses/Orbits: No acute finding. Other: None. CT CERVICAL SPINE FINDINGS Alignment: Normal. Skull base and vertebrae: No acute fracture. No primary bone lesion or focal pathologic process. Soft  tissues and spinal canal: No prevertebral fluid or swelling. No visible canal hematoma. Disc levels: Small disc protrusions at C3-C4 and C4-C5. Disc heights are relatively preserved. Minimal facet arthropathy throughout the cervical spine. Upper chest: Negative. Other: None. IMPRESSION: 1.  No acute intracranial abnormality. 2.  No acute cervical spine fracture. Electronically Signed   By: Titus Dubin M.D.   On: 05/07/2018 13:51   Ct Cervical Spine Wo Contrast  Result Date: 05/07/2018 CLINICAL DATA:  Fall in the shower.  Hit the back of the head. EXAM: CT HEAD WITHOUT CONTRAST CT CERVICAL SPINE WITHOUT CONTRAST TECHNIQUE: Multidetector CT imaging of the head and cervical spine was performed following the standard protocol without intravenous contrast. Multiplanar CT image reconstructions of the cervical spine were also generated. COMPARISON:  CT head dated March 30, 2018. FINDINGS: CT HEAD FINDINGS Brain: No evidence of acute infarction, hemorrhage, hydrocephalus, extra-axial collection or mass lesion/mass effect. Cerebral volume is normal for age. Vascular: Calcified atherosclerosis at the skullbase. No hyperdense vessel. Skull: Normal. Negative for fracture or focal lesion. Sinuses/Orbits: No acute finding. Other: None. CT CERVICAL SPINE FINDINGS Alignment: Normal. Skull base and vertebrae: No acute fracture. No primary bone lesion or focal pathologic process. Soft tissues and spinal canal: No prevertebral fluid or swelling. No visible canal hematoma. Disc levels:  Small disc protrusions at C3-C4 and C4-C5. Disc heights are relatively preserved. Minimal facet arthropathy throughout the cervical spine. Upper chest: Negative. Other: None. IMPRESSION: 1.  No acute intracranial abnormality. 2.  No acute cervical spine fracture. Electronically Signed   By: Titus Dubin M.D.   On: 05/07/2018 13:51   Dg Shoulder Left  Result Date: 05/07/2018 CLINICAL DATA:  LEFT shoulder and wrist pain after falling in  the shower EXAM: LEFT SHOULDER - 2+ VIEW COMPARISON:  None FINDINGS: Osseous demineralization. AC joint alignment normal. Diffuse joint space narrowing of LEFT glenohumeral joint with subchondral sclerosis and spur formation. No acute fracture, dislocation, or bone destruction. Visualized LEFT ribs intact. IMPRESSION: Osseous demineralization with significant degenerative changes of the LEFT glenohumeral joint. No acute abnormalities. Electronically Signed   By: Lavonia Dana M.D.   On: 05/07/2018 13:21    Procedures Procedures (including critical care time)  Medications Ordered in ED Medications  acetaminophen (TYLENOL) tablet 650 mg (650 mg Oral Given 05/07/18 1236)     Initial Impression / Assessment and Plan / ED Course  I have reviewed the triage vital signs and the nursing notes.  Pertinent labs & imaging results that were available during my care of the patient were reviewed by me and considered in my medical decision making (see chart for details).        Patient reports after witnessed fall from shower chair.  She is afebrile, no acute distress.  Patient has midline cervical tenderness, left shoulder and elbow tenderness. Will evaluate with CT head and neck, and xrays of left shoulder and elbow, wrist . Given that pt is unsure what caused her to fall will check cbc, bmp, and ekg.  CT head and neck viewed by me shows no acute abnormalities. I viewed her xrays and left shoulder, elbow, wrist are all negative for acute fractures. EKG viewed by me shows no sinus rhythm, no ischemic changes. Labs are unremarkable including BMP and CBC.  Patient reports the pain in her head neck and left arm are much improved after Tylenol.  On reevaluation she is able to move all 4 extremities without pain.  Patient has ulcer on left shoulder that is currently being evaluated for by wound care.  Looking through EMR the ulcer appears much improved since 1 month ago.  There is no purulent discharge and area  is nontender to palpation.  Patient also has chronic sacral ulcer also being evaluated by wound care.  There is no purulent discharge it is nontender palpation.  There are no obvious signs of infection of either ulcers.  Given patient's overall negative work-up and improvement of pain she is stable for discharge back to the SNF.  Discussed the results with her daughter over the phone   Patient and her daughter are comfortable with the plan.   Evaluation does not show pathology that would require ongoing emergent intervention or inpatient treatment. I explained the diagnosis to the patient and her daughter over the phone since she is unable to be here due to visitor restrictions.   Patient and daughter are comfortable with above plan and is stable for discharge at this time. All questions were answered prior to disposition. Strict return precautions for returning to the ED were discussed. Encouraged follow up with PCP. Pt case discussed with Dr. Tyrone Nine who agrees with my plan.    This note was prepared with assistance of Systems analyst. Occasional wrong-word or sound-a-like substitutions may have occurred due to the inherent  limitations of voice recognition software.    Final Clinical Impressions(s) / ED Diagnoses   Final diagnoses:  Fall, initial encounter    ED Discharge Orders    None       Cherre Robins, PA-C 05/07/18 [place], [place], DO 05/07/18 1816

## 2018-05-07 NOTE — ED Triage Notes (Signed)
Patient arrived from a [facility] with reports of fall. Patient reports that she was drying herself while sitting in a shower chair and fell. Reports back of head hurt, left shoulder huts and back of neck. Also reports that she is wheelchair bound for 10years

## 2018-05-07 NOTE — Discharge Instructions (Addendum)
You have been seen today after a fall. Please read and follow all provided instructions. Return to the emergency room for worsening condition or new concerning symptoms.    1. Medications: Please take Tylenol for pain as needed. Please take as prescribed on the bottle. Continue usual home medications  2. Treatment: rest, drink plenty of fluids.  It is important that you continue to move your left shoulder and elbow so that they do not get stiff.  You should do range of motion exercises throughout the day.  Please have wound care continue to evaluate the sores on your arm and bottom.   3. Follow Up: Please follow up with your primary doctor in 2-5 days for discussion of your diagnoses and further evaluation after today's visit; Call today to arrange your follow up.   ?

## 2018-05-07 NOTE — ED Notes (Signed)
Phlebotomy and this RN unable to get blood.

## 2018-05-07 NOTE — ED Notes (Signed)
Phlebotomy at bedside.

## 2018-05-07 NOTE — ED Notes (Signed)
Patient transported to X-ray 

## 2018-07-19 ENCOUNTER — Emergency Department (HOSPITAL_COMMUNITY): Payer: Medicare Other

## 2018-07-19 ENCOUNTER — Other Ambulatory Visit: Payer: Self-pay

## 2018-07-19 ENCOUNTER — Encounter (HOSPITAL_COMMUNITY): Payer: Self-pay | Admitting: Emergency Medicine

## 2018-07-19 ENCOUNTER — Inpatient Hospital Stay (HOSPITAL_COMMUNITY)
Admission: EM | Admit: 2018-07-19 | Discharge: 2018-07-22 | DRG: 872 | Disposition: A | Discharge status: Home Health | Payer: Medicare Other | Attending: Family Medicine | Admitting: Family Medicine

## 2018-07-19 DIAGNOSIS — Z888 Allergy status to other drugs, medicaments and biological substances status: Secondary | ICD-10-CM | POA: Diagnosis not present

## 2018-07-19 DIAGNOSIS — E785 Hyperlipidemia, unspecified: Secondary | ICD-10-CM | POA: Diagnosis present

## 2018-07-19 DIAGNOSIS — A419 Sepsis, unspecified organism: Principal | ICD-10-CM | POA: Diagnosis present

## 2018-07-19 DIAGNOSIS — I252 Old myocardial infarction: Secondary | ICD-10-CM

## 2018-07-19 DIAGNOSIS — I472 Ventricular tachycardia, unspecified: Secondary | ICD-10-CM

## 2018-07-19 DIAGNOSIS — Z6841 Body Mass Index (BMI) 40.0 and over, adult: Secondary | ICD-10-CM

## 2018-07-19 DIAGNOSIS — Z993 Dependence on wheelchair: Secondary | ICD-10-CM | POA: Diagnosis not present

## 2018-07-19 DIAGNOSIS — R945 Abnormal results of liver function studies: Secondary | ICD-10-CM

## 2018-07-19 DIAGNOSIS — I214 Non-ST elevation (NSTEMI) myocardial infarction: Secondary | ICD-10-CM

## 2018-07-19 DIAGNOSIS — Z8673 Personal history of transient ischemic attack (TIA), and cerebral infarction without residual deficits: Secondary | ICD-10-CM

## 2018-07-19 DIAGNOSIS — I2510 Atherosclerotic heart disease of native coronary artery without angina pectoris: Secondary | ICD-10-CM | POA: Diagnosis present

## 2018-07-19 DIAGNOSIS — F200 Paranoid schizophrenia: Secondary | ICD-10-CM | POA: Diagnosis present

## 2018-07-19 DIAGNOSIS — R7989 Other specified abnormal findings of blood chemistry: Secondary | ICD-10-CM

## 2018-07-19 DIAGNOSIS — F329 Major depressive disorder, single episode, unspecified: Secondary | ICD-10-CM | POA: Diagnosis present

## 2018-07-19 DIAGNOSIS — Z1159 Encounter for screening for other viral diseases: Secondary | ICD-10-CM | POA: Diagnosis not present

## 2018-07-19 DIAGNOSIS — E872 Acidosis, unspecified: Secondary | ICD-10-CM | POA: Diagnosis present

## 2018-07-19 DIAGNOSIS — K7581 Nonalcoholic steatohepatitis (NASH): Secondary | ICD-10-CM | POA: Diagnosis present

## 2018-07-19 DIAGNOSIS — R109 Unspecified abdominal pain: Secondary | ICD-10-CM

## 2018-07-19 DIAGNOSIS — S21209A Unspecified open wound of unspecified back wall of thorax without penetration into thoracic cavity, initial encounter: Secondary | ICD-10-CM | POA: Diagnosis present

## 2018-07-19 DIAGNOSIS — I10 Essential (primary) hypertension: Secondary | ICD-10-CM | POA: Diagnosis not present

## 2018-07-19 DIAGNOSIS — I4581 Long QT syndrome: Secondary | ICD-10-CM | POA: Diagnosis present

## 2018-07-19 DIAGNOSIS — Z79899 Other long term (current) drug therapy: Secondary | ICD-10-CM | POA: Diagnosis not present

## 2018-07-19 DIAGNOSIS — Z87891 Personal history of nicotine dependence: Secondary | ICD-10-CM | POA: Diagnosis not present

## 2018-07-19 DIAGNOSIS — S21209D Unspecified open wound of unspecified back wall of thorax without penetration into thoracic cavity, subsequent encounter: Secondary | ICD-10-CM | POA: Diagnosis not present

## 2018-07-19 DIAGNOSIS — R509 Fever, unspecified: Secondary | ICD-10-CM | POA: Diagnosis present

## 2018-07-19 DIAGNOSIS — L89159 Pressure ulcer of sacral region, unspecified stage: Secondary | ICD-10-CM | POA: Diagnosis present

## 2018-07-19 DIAGNOSIS — Z79891 Long term (current) use of opiate analgesic: Secondary | ICD-10-CM

## 2018-07-19 DIAGNOSIS — E1129 Type 2 diabetes mellitus with other diabetic kidney complication: Secondary | ICD-10-CM | POA: Diagnosis present

## 2018-07-19 DIAGNOSIS — J45909 Unspecified asthma, uncomplicated: Secondary | ICD-10-CM | POA: Diagnosis present

## 2018-07-19 DIAGNOSIS — Z7951 Long term (current) use of inhaled steroids: Secondary | ICD-10-CM

## 2018-07-19 DIAGNOSIS — Z7401 Bed confinement status: Secondary | ICD-10-CM

## 2018-07-19 DIAGNOSIS — R112 Nausea with vomiting, unspecified: Secondary | ICD-10-CM

## 2018-07-19 DIAGNOSIS — E7800 Pure hypercholesterolemia, unspecified: Secondary | ICD-10-CM | POA: Diagnosis present

## 2018-07-19 DIAGNOSIS — M109 Gout, unspecified: Secondary | ICD-10-CM | POA: Diagnosis present

## 2018-07-19 DIAGNOSIS — Z794 Long term (current) use of insulin: Secondary | ICD-10-CM

## 2018-07-19 DIAGNOSIS — Z7982 Long term (current) use of aspirin: Secondary | ICD-10-CM

## 2018-07-19 LAB — COMPREHENSIVE METABOLIC PANEL
ALT: 66 U/L — ABNORMAL HIGH (ref 0–44)
AST: 112 U/L — ABNORMAL HIGH (ref 15–41)
Albumin: 3.7 g/dL (ref 3.5–5.0)
Alkaline Phosphatase: 477 U/L — ABNORMAL HIGH (ref 38–126)
Anion gap: 13 (ref 5–15)
BUN: 8 mg/dL (ref 8–23)
CO2: 22 mmol/L (ref 22–32)
Calcium: 9.5 mg/dL (ref 8.9–10.3)
Chloride: 102 mmol/L (ref 98–111)
Creatinine, Ser: 0.75 mg/dL (ref 0.44–1.00)
GFR calc Af Amer: >60 mL/min (ref >60)
GFR calc non Af Amer: >60 mL/min (ref >60)
Glucose, Bld: 246 mg/dL — ABNORMAL HIGH (ref 70–99)
Potassium: 4.5 mmol/L (ref 3.5–5.1)
Sodium: 137 mmol/L (ref 135–145)
Total Bilirubin: 2.7 mg/dL — ABNORMAL HIGH (ref 0.3–1.2)
Total Protein: 6.9 g/dL (ref 6.5–8.1)

## 2018-07-19 LAB — URINALYSIS, ROUTINE W REFLEX MICROSCOPIC
Bilirubin Urine: NEGATIVE
Glucose, UA: 150 mg/dL — AB
Hgb urine dipstick: NEGATIVE
Ketones, ur: 20 mg/dL — AB
Leukocytes,Ua: NEGATIVE
Nitrite: NEGATIVE
Protein, ur: 100 mg/dL — AB
Specific Gravity, Urine: 1.011 (ref 1.005–1.030)
pH: 6 (ref 5.0–8.0)

## 2018-07-19 LAB — CBC WITH DIFFERENTIAL/PLATELET
Abs Immature Granulocytes: 0.05 10*3/uL (ref 0.00–0.07)
Basophils Absolute: 0 10*3/uL (ref 0.0–0.1)
Basophils Relative: 0 %
Eosinophils Absolute: 0.1 10*3/uL (ref 0.0–0.5)
Eosinophils Relative: 1 %
HCT: 43.1 % (ref 36.0–46.0)
Hemoglobin: 13.9 g/dL (ref 12.0–15.0)
Immature Granulocytes: 1 %
Lymphocytes Relative: 8 %
Lymphs Abs: 0.9 10*3/uL (ref 0.7–4.0)
MCH: 30.8 pg (ref 26.0–34.0)
MCHC: 32.3 g/dL (ref 30.0–36.0)
MCV: 95.4 fL (ref 80.0–100.0)
Monocytes Absolute: 0.8 10*3/uL (ref 0.1–1.0)
Monocytes Relative: 7 %
Neutro Abs: 8.9 10*3/uL — ABNORMAL HIGH (ref 1.7–7.7)
Neutrophils Relative %: 83 %
Platelets: 216 10*3/uL (ref 150–400)
RBC: 4.52 MIL/uL (ref 3.87–5.11)
RDW: 13.2 % (ref 11.5–15.5)
WBC: 10.7 10*3/uL — ABNORMAL HIGH (ref 4.0–10.5)
nRBC: 0 % (ref 0.0–0.2)

## 2018-07-19 LAB — LACTIC ACID, PLASMA
Lactic Acid, Venous: 2.3 mmol/L (ref 0.5–1.9)
Lactic Acid, Venous: 1.4 mmol/L (ref 0.5–1.9)

## 2018-07-19 LAB — HEMOGLOBIN A1C
Hgb A1c MFr Bld: 6.5 % — ABNORMAL HIGH (ref 4.8–5.6)
Mean Plasma Glucose: 139.85 mg/dL

## 2018-07-19 LAB — GLUCOSE, CAPILLARY: Glucose-Capillary: 217 mg/dL — ABNORMAL HIGH (ref 70–99)

## 2018-07-19 LAB — TROPONIN I
Troponin I: 0.14 ng/mL (ref <0.03)
Troponin I: 0.14 ng/mL (ref <0.03)

## 2018-07-19 LAB — MAGNESIUM: Magnesium: 1.5 mg/dL — ABNORMAL LOW (ref 1.7–2.4)

## 2018-07-19 LAB — CBG MONITORING, ED: Glucose-Capillary: 269 mg/dL — ABNORMAL HIGH (ref 70–99)

## 2018-07-19 LAB — SARS CORONAVIRUS 2: SARS Coronavirus 2: NOT DETECTED

## 2018-07-19 LAB — LIPASE, BLOOD: Lipase: 22 U/L (ref 11–51)

## 2018-07-19 MED ORDER — SODIUM CHLORIDE 0.9% FLUSH
3.0000 mL | Freq: Once | INTRAVENOUS | Status: AC
  Administered 2018-07-19: 3 mL via INTRAVENOUS

## 2018-07-19 MED ORDER — IPRATROPIUM-ALBUTEROL 0.5-2.5 (3) MG/3ML IN SOLN: 3.0000 mL | Freq: Four times a day (QID) | RESPIRATORY_TRACT | Status: DC | PRN

## 2018-07-19 MED ORDER — AMIODARONE HCL IN DEXTROSE 360-4.14 MG/200ML-% IV SOLN
30.0000 mg/h | INTRAVENOUS | Status: DC
  Administered 2018-07-19 (×2): 30 mg/h via INTRAVENOUS
  Filled 2018-07-19 (×2): qty 200

## 2018-07-19 MED ORDER — AZELASTINE-FLUTICASONE 137-50 MCG/ACT NA SUSP: 1.0000 | Freq: Two times a day (BID) | NASAL | Status: DC | PRN

## 2018-07-19 MED ORDER — AMLODIPINE BESYLATE 10 MG PO TABS
10.0000 mg | ORAL_TABLET | Freq: Every day | ORAL | Status: DC
  Administered 2018-07-20 – 2018-07-22 (×3): 10 mg via ORAL
  Filled 2018-07-19 (×3): qty 1

## 2018-07-19 MED ORDER — RISPERIDONE 0.5 MG PO TABS
0.5000 mg | ORAL_TABLET | Freq: Two times a day (BID) | ORAL | Status: DC
  Administered 2018-07-20 – 2018-07-22 (×6): 0.5 mg via ORAL
  Filled 2018-07-19 (×7): qty 1

## 2018-07-19 MED ORDER — VANCOMYCIN HCL 10 G IV SOLR
2000.0000 mg | Freq: Once | INTRAVENOUS | Status: AC
  Administered 2018-07-19: 14:00:00 2000 mg via INTRAVENOUS
  Filled 2018-07-19: qty 2000

## 2018-07-19 MED ORDER — BENAZEPRIL HCL 20 MG PO TABS
20.0000 mg | ORAL_TABLET | Freq: Every day | ORAL | Status: DC
  Administered 2018-07-20 – 2018-07-22 (×3): 20 mg via ORAL
  Filled 2018-07-19 (×3): qty 1

## 2018-07-19 MED ORDER — LORATADINE 10 MG PO TABS
10.0000 mg | ORAL_TABLET | Freq: Every day | ORAL | Status: DC
  Administered 2018-07-20 – 2018-07-22 (×3): 10 mg via ORAL
  Filled 2018-07-19 (×3): qty 1

## 2018-07-19 MED ORDER — VANCOMYCIN HCL IN DEXTROSE 1-5 GM/200ML-% IV SOLN: 1000.0000 mg | Freq: Once | INTRAVENOUS | Status: DC

## 2018-07-19 MED ORDER — GABAPENTIN 300 MG PO CAPS
300.0000 mg | ORAL_CAPSULE | Freq: Three times a day (TID) | ORAL | Status: DC
  Administered 2018-07-20 – 2018-07-22 (×9): 300 mg via ORAL
  Filled 2018-07-19 (×10): qty 1

## 2018-07-19 MED ORDER — MAGNESIUM SULFATE 2 GM/50ML IV SOLN
2.0000 g | Freq: Once | INTRAVENOUS | Status: AC
  Administered 2018-07-19: 16:00:00 2 g via INTRAVENOUS
  Filled 2018-07-19: qty 50

## 2018-07-19 MED ORDER — PROMETHAZINE HCL 25 MG/ML IJ SOLN
12.5000 mg | Freq: Once | INTRAMUSCULAR | Status: AC
  Administered 2018-07-20: 12.5 mg via INTRAVENOUS
  Filled 2018-07-19: qty 1

## 2018-07-19 MED ORDER — METRONIDAZOLE IN NACL 5-0.79 MG/ML-% IV SOLN
500.0000 mg | Freq: Once | INTRAVENOUS | Status: AC
  Administered 2018-07-19: 16:00:00 500 mg via INTRAVENOUS
  Filled 2018-07-19: qty 100

## 2018-07-19 MED ORDER — MAGNESIUM SULFATE 2 GM/50ML IV SOLN
2.0000 g | Freq: Once | INTRAVENOUS | Status: AC
  Administered 2018-07-19: 2 g via INTRAVENOUS
  Filled 2018-07-19: qty 50

## 2018-07-19 MED ORDER — METOPROLOL TARTRATE 25 MG PO TABS
25.0000 mg | ORAL_TABLET | Freq: Two times a day (BID) | ORAL | Status: DC
  Administered 2018-07-19 – 2018-07-20 (×2): 25 mg via ORAL
  Filled 2018-07-19 (×2): qty 1

## 2018-07-19 MED ORDER — ACETAMINOPHEN 325 MG PO TABS
650.0000 mg | ORAL_TABLET | Freq: Four times a day (QID) | ORAL | Status: DC | PRN
  Administered 2018-07-21: 16:00:00 650 mg via ORAL
  Filled 2018-07-19: qty 2

## 2018-07-19 MED ORDER — DOXEPIN HCL 25 MG PO CAPS
25.0000 mg | ORAL_CAPSULE | Freq: Two times a day (BID) | ORAL | Status: DC
  Administered 2018-07-20 – 2018-07-22 (×5): 25 mg via ORAL
  Filled 2018-07-19 (×7): qty 1

## 2018-07-19 MED ORDER — FAMOTIDINE 20 MG PO TABS
20.0000 mg | ORAL_TABLET | Freq: Every day | ORAL | Status: DC
  Administered 2018-07-21 – 2018-07-22 (×3): 20 mg via ORAL
  Filled 2018-07-19 (×4): qty 1

## 2018-07-19 MED ORDER — TRAMADOL HCL 50 MG PO TABS
50.0000 mg | ORAL_TABLET | Freq: Four times a day (QID) | ORAL | Status: DC | PRN
  Administered 2018-07-21: 50 mg via ORAL
  Filled 2018-07-19: qty 1

## 2018-07-19 MED ORDER — ACETAMINOPHEN 500 MG PO TABS
1000.0000 mg | ORAL_TABLET | Freq: Once | ORAL | Status: AC
  Administered 2018-07-19: 14:00:00 1000 mg via ORAL

## 2018-07-19 MED ORDER — ALLOPURINOL 300 MG PO TABS
300.0000 mg | ORAL_TABLET | Freq: Every day | ORAL | Status: DC
  Administered 2018-07-20 – 2018-07-22 (×3): 300 mg via ORAL
  Filled 2018-07-19 (×3): qty 1

## 2018-07-19 MED ORDER — AZELASTINE HCL 0.1 % NA SOLN
1.0000 | Freq: Two times a day (BID) | NASAL | Status: DC | PRN
  Administered 2018-07-21: 1 via NASAL
  Filled 2018-07-19: qty 30

## 2018-07-19 MED ORDER — AVEENO SOOTHING BATH TREATMENT EX PACK
1.0000 | PACK | Freq: Every day | CUTANEOUS | Status: DC | PRN
  Filled 2018-07-19: qty 1

## 2018-07-19 MED ORDER — COLCHICINE 0.6 MG PO TABS
0.6000 mg | ORAL_TABLET | Freq: Two times a day (BID) | ORAL | Status: DC
  Administered 2018-07-20 – 2018-07-22 (×6): 0.6 mg via ORAL
  Filled 2018-07-19 (×8): qty 1

## 2018-07-19 MED ORDER — BIOTENE DRY MOUTH MT LIQD
15.0000 mL | Freq: Three times a day (TID) | OROMUCOSAL | Status: DC
  Administered 2018-07-19 – 2018-07-21 (×6): 15 mL via OROMUCOSAL

## 2018-07-19 MED ORDER — SODIUM CHLORIDE 0.9 % IV SOLN
2.0000 g | Freq: Once | INTRAVENOUS | Status: AC
  Administered 2018-07-19: 14:00:00 2 g via INTRAVENOUS
  Filled 2018-07-19: qty 2

## 2018-07-19 MED ORDER — INSULIN ASPART 100 UNIT/ML ~~LOC~~ SOLN
0.0000 [IU] | SUBCUTANEOUS | Status: DC
  Administered 2018-07-19: 5 [IU] via SUBCUTANEOUS
  Administered 2018-07-20: 2 [IU] via SUBCUTANEOUS
  Administered 2018-07-20: 5 [IU] via SUBCUTANEOUS
  Administered 2018-07-20 – 2018-07-21 (×2): 2 [IU] via SUBCUTANEOUS
  Administered 2018-07-21: 01:00:00 1 [IU] via SUBCUTANEOUS
  Administered 2018-07-21 (×3): 3 [IU] via SUBCUTANEOUS
  Administered 2018-07-22: 2 [IU] via SUBCUTANEOUS
  Administered 2018-07-22: 3 [IU] via SUBCUTANEOUS

## 2018-07-19 MED ORDER — SODIUM CHLORIDE 0.9 % IV SOLN
2.0000 g | Freq: Three times a day (TID) | INTRAVENOUS | Status: DC
  Administered 2018-07-20 (×3): 2 g via INTRAVENOUS
  Filled 2018-07-19 (×3): qty 2

## 2018-07-19 MED ORDER — HYDROXYZINE HCL 25 MG PO TABS
25.0000 mg | ORAL_TABLET | Freq: Three times a day (TID) | ORAL | Status: DC
  Administered 2018-07-20 – 2018-07-22 (×9): 25 mg via ORAL
  Filled 2018-07-19 (×10): qty 1

## 2018-07-19 MED ORDER — AMIODARONE LOAD VIA INFUSION
150.0000 mg | Freq: Once | INTRAVENOUS | Status: AC
  Administered 2018-07-19: 13:00:00 150 mg via INTRAVENOUS
  Filled 2018-07-19: qty 83.34

## 2018-07-19 MED ORDER — SODIUM CHLORIDE 0.9% FLUSH: 10.0000 mL | INTRAVENOUS | Status: DC | PRN

## 2018-07-19 MED ORDER — NYSTATIN 100000 UNIT/GM EX POWD
1.0000 g | Freq: Two times a day (BID) | CUTANEOUS | Status: DC | PRN
  Filled 2018-07-19: qty 15

## 2018-07-19 MED ORDER — ACETAMINOPHEN 500 MG PO TABS
1000.0000 mg | ORAL_TABLET | Freq: Once | ORAL | Status: DC
  Filled 2018-07-19: qty 2

## 2018-07-19 MED ORDER — ENOXAPARIN SODIUM 40 MG/0.4ML ~~LOC~~ SOLN
40.0000 mg | SUBCUTANEOUS | Status: DC
  Administered 2018-07-19 – 2018-07-21 (×3): 40 mg via SUBCUTANEOUS
  Filled 2018-07-19 (×3): qty 0.4

## 2018-07-19 MED ORDER — INSULIN GLARGINE 100 UNIT/ML ~~LOC~~ SOLN
4.0000 [IU] | Freq: Every day | SUBCUTANEOUS | Status: DC
  Administered 2018-07-19 – 2018-07-22 (×4): 4 [IU] via SUBCUTANEOUS
  Filled 2018-07-19 (×4): qty 0.04

## 2018-07-19 MED ORDER — ASPIRIN EC 81 MG PO TBEC
81.0000 mg | DELAYED_RELEASE_TABLET | Freq: Every day | ORAL | Status: DC
  Administered 2018-07-20 – 2018-07-22 (×3): 81 mg via ORAL
  Filled 2018-07-19 (×3): qty 1

## 2018-07-19 MED ORDER — SODIUM CHLORIDE 0.9% FLUSH
10.0000 mL | Freq: Two times a day (BID) | INTRAVENOUS | Status: DC
  Administered 2018-07-19 – 2018-07-22 (×4): 10 mL

## 2018-07-19 MED ORDER — AMIODARONE HCL IN DEXTROSE 360-4.14 MG/200ML-% IV SOLN
60.0000 mg/h | INTRAVENOUS | Status: AC
  Administered 2018-07-19 (×2): 60 mg/h via INTRAVENOUS
  Filled 2018-07-19 (×2): qty 200

## 2018-07-19 MED ORDER — SILVER SULFADIAZINE 1 % EX CREA
TOPICAL_CREAM | Freq: Two times a day (BID) | CUTANEOUS | Status: DC
  Administered 2018-07-19 – 2018-07-22 (×6): via TOPICAL
  Filled 2018-07-19: qty 85

## 2018-07-19 MED ORDER — IOHEXOL 300 MG/ML  SOLN
100.0000 mL | Freq: Once | INTRAMUSCULAR | Status: AC | PRN
  Administered 2018-07-19: 18:00:00 100 mL via INTRAVENOUS

## 2018-07-19 MED ORDER — COLLOIDAL OATMEAL 1 % EX CREA: 1.0000 "application " | TOPICAL_CREAM | Freq: Two times a day (BID) | CUTANEOUS | Status: DC

## 2018-07-19 MED ORDER — SODIUM CHLORIDE 0.9 % IV BOLUS
1000.0000 mL | Freq: Once | INTRAVENOUS | Status: AC
  Administered 2018-07-19: 14:00:00 1000 mL via INTRAVENOUS

## 2018-07-19 MED ORDER — POTASSIUM CHLORIDE IN NACL 20-0.9 MEQ/L-% IV SOLN
INTRAVENOUS | Status: DC
  Administered 2018-07-19 – 2018-07-22 (×7): via INTRAVENOUS
  Filled 2018-07-19 (×9): qty 1000

## 2018-07-19 MED ORDER — ONDANSETRON HCL 4 MG/2ML IJ SOLN
4.0000 mg | Freq: Four times a day (QID) | INTRAMUSCULAR | Status: DC | PRN
  Administered 2018-07-19: 23:00:00 4 mg via INTRAVENOUS
  Filled 2018-07-19: qty 2

## 2018-07-19 MED ORDER — FLUTICASONE PROPIONATE 50 MCG/ACT NA SUSP
1.0000 | Freq: Two times a day (BID) | NASAL | Status: DC | PRN
  Administered 2018-07-22: 1 via NASAL
  Filled 2018-07-19: qty 16

## 2018-07-19 MED ORDER — ALBUTEROL SULFATE (2.5 MG/3ML) 0.083% IN NEBU: 2.5000 mg | INHALATION_SOLUTION | RESPIRATORY_TRACT | Status: DC | PRN

## 2018-07-19 MED ORDER — POLYETHYLENE GLYCOL 3350 17 G PO PACK: 17.0000 g | PACK | Freq: Every day | ORAL | Status: DC | PRN

## 2018-07-19 MED ORDER — VANCOMYCIN HCL 10 G IV SOLR
1500.0000 mg | INTRAVENOUS | Status: DC
  Administered 2018-07-20: 13:00:00 1500 mg via INTRAVENOUS
  Filled 2018-07-19: qty 1500

## 2018-07-19 NOTE — ED Triage Notes (Signed)
Per EMS- pt here from home, speaks [place] and [place]anish. Pt arrives complaining of fever X 2 days, with nausea and vomiting. Pt also endorsees mid abdominal pain. Pt febrile and brady.

## 2018-07-19 NOTE — ED Notes (Signed)
US at bedside

## 2018-07-19 NOTE — Significant Event (Addendum)
Rapid Response Event Note  Overview:Called d/t new admit from ED having runs of vtach. Time Called: 2135 Arrival Time: 2140 Event Type: Cardiac  Initial Focused Assessment: Pt laying in bed, alert and oriented, c/o nausea, SOB at times, and palpitations.  T-98.6, HR-70s(SR with freq PVCs and non-sustained vtach), BP-160/67, RR-23, SpO2-93% on RA. Pt having nausea and SOB. SpO2 dropping down to 87%, 4L [place] applied. Lungs CTA, but diminished.   Interventions: 4L [place] K>4.5-NS with 60meq KCL@125cc /hr already ordered from ED. Mg>1.5-2g Mag given in ED with orders for 2g more to be given. Zofran 4mg  IV given x 1 IV team consult for additional PIV to give Mg and MIVF   Plan of Care (if not transferred): Discussed with charge RN and bedside RN sustained vs non-sustained vtach, and unstable vs stable vtach. Staff know to call code blue if pt has unstable sustained vtach(loss of consciousness/AMS, hypotension, loss of pulse) Staff know if pt has sustained vtach but pt alert and oriented, BP WNL, and pulse present, to call RRT for further assistance. Staff also aware that they can call RRT if other questions/assistance needed.   Event Summary:   at      at          [facility], Carren Rang

## 2018-07-19 NOTE — ED Provider Notes (Signed)
[place] EMERGENCY DEPARTMENT Provider Note   CSN: [id number] Arrival date & time: 07/19/18  1230    History   Chief Complaint Chief Complaint  Patient presents with  . Fever  . Nausea  . Abdominal Pain    HPI Heather Petty is a 68 y.o. female with past medical history of AKI, depression, diabetes, hypertension, hyperkalemia who presents via EMS for evaluation of nausea/vomiting and abdominal pain that began last night.  Per EMS, patient called for complaints of abdominal pain that began last night.  Patient reports all over.  She reports multiple episodes of vomiting.  No diarrhea.   EM LEVEL 5 CAVEAT DUE TO ACUITY OF CONDITION     The history is provided by the patient.    Past Medical History:  Diagnosis Date  . AKI (acute kidney injury) ([place]) 12/17/2017  . Arthritis   . Asthma   . Depression   . Diabetes mellitus   . Gout   . High cholesterol   . Hypertension   . Morbid obesity ([place])   . Pneumonia 12/17/2017  . Schizophrenia ([place])   . Stroke [facility])     Patient Active Problem List   Diagnosis Date Noted  . Wound infection 04/03/2018  . Morbid obesity with BMI of 50.0-59.9, adult ([place]) 04/03/2018  . Elevated alkaline phosphatase level 04/03/2018  . Asthma exacerbation 03/14/2018  . Hypotension 03/14/2018  . Lactic acidosis 03/14/2018  . GERD (gastroesophageal reflux disease) 03/14/2018  . Acute renal failure superimposed on stage 3 chronic kidney disease ([place]) 03/14/2018  . Macrocytic anemia 03/14/2018  . Abnormal LFTs 03/14/2018  . Acute kidney injury ([place]) 02/28/2018  . Wound of sacral region 02/28/2018  . Hypothermia 02/28/2018  . Pressure injury of skin 02/06/2018  . Acute renal failure (ARF) ([place]) 02/05/2018  . Hyperlipidemia associated with type 2 diabetes mellitus ([place]) 02/05/2018  . Hyperkalemia 02/05/2018  . Dermatitis 02/05/2018  . Open back wound 02/05/2018  . Dysuria 02/05/2018  . Paranoid schizophrenia ([place])   .  CAP (community acquired pneumonia) 12/17/2017  . AKI (acute kidney injury) ([place]) 12/17/2017  . Type II diabetes mellitus with renal manifestations ([place]) 12/17/2017  . HTN (hypertension) 12/17/2017  . Gout     Past Surgical History:  Procedure Laterality Date  . CYST EXCISION    . DENTAL SURGERY    . TONSILLECTOMY       OB History   No obstetric history on file.      Home Medications    Prior to Admission medications   Medication Sig Start Date End Date Taking? Authorizing Provider  acetaminophen (TYLENOL) 325 MG tablet Take 650 mg by mouth 4 (four) times daily as needed for headache (pain).   Yes [provider]  albuterol (VENTOLIN HFA) 108 (90 Base) MCG/ACT inhaler Inhale 2 puffs into the lungs every 4 (four) hours as needed for wheezing or shortness of breath.   Yes [provider]  allopurinol (ZYLOPRIM) 300 MG tablet Take 300 mg by mouth daily.   Yes [provider]  amLODipine (NORVASC) 10 MG tablet Take 10 mg by mouth daily.   Yes [provider]  antiseptic oral rinse (BIOTENE) LIQD 15 mLs by Mouth Rinse route 3 (three) times daily. Swish and spit   Yes [provider]  Azelastine-Fluticasone (DYMISTA) 137-50 MCG/ACT SUSP Place 1 spray into both nostrils 2 (two) times daily as needed (for allergies).    Yes [provider]  b complex vitamins tablet Take 1  tablet by mouth daily.   Yes [provider]  benazepril (LOTENSIN) 20 MG tablet Take 20 mg by mouth daily.   Yes [provider]  cetirizine (ZYRTEC) 10 MG tablet Take 10 mg by mouth daily.    Yes [provider]  colchicine 0.6 MG tablet Take 1 tablet (0.6 mg total) by mouth 2 (two) times daily. 04/05/18  Yes Ghimire, Henreitta Leber, MD  collagenase (SANTYL) ointment Apply topically daily. Patient taking differently: Apply 1 application topically See admin instructions. Clean with Dakin's Solution (half strength), apply skin prep periwound. Apply  Santyl Ointment to necrotic tissue in wound bed, cover with Calcium Alginate, cover with bordered foam dressing daily. 02/09/18  Yes Elgergawy, Silver Huguenin, MD  Colloidal Oatmeal (EUCERIN ECZEMA RELIEF) 1 % CREA Apply 1 application topically 2 (two) times daily.   Yes [provider]  doxepin (SINEQUAN) 25 MG capsule Take 25 mg by mouth 2 (two) times daily.    Yes [provider]  famotidine (PEPCID) 20 MG tablet Take 20 mg by mouth at bedtime.    Yes [provider]  folic acid (FOLVITE) 1 MG tablet Take 1 mg by mouth daily.   Yes [provider]  gabapentin (NEURONTIN) 300 MG capsule Take 300 mg by mouth 3 (three) times daily.   Yes [provider]  hydrocortisone 2.5 % cream Apply 1 application topically See admin instructions. Apply topically to rash on face twice daily   Yes [provider]  hydrOXYzine (ATARAX/VISTARIL) 25 MG tablet Take 25 mg by mouth 3 (three) times daily.   Yes [provider]  insulin aspart (NOVOLOG) 100 UNIT/ML injection Take subcu QA CHS, 140-199 - 2 units, 200-250 - 6 units, 251-299 - 8 units,  300-349 - 10 units,  350 or above 14 units. Patient taking differently: Inject 0-12 Units into the skin See admin instructions. Inject 0-12 units into the skin 4 times a day (before meals and at bedtime) per sliding scale: BGL <60 or >400  = CALL MD; 60-200 = 0 units; 201-250 = 2 units; 251-300 = 4 units; 301-350 = 6 units; 351-400 = 8 units; 401-450 = 10 units; >450 = 12 units 03/05/18  Yes Thurnell Lose, MD  insulin glargine (LANTUS) 100 UNIT/ML injection Inject 0.07 mLs (7 Units total) into the skin at bedtime. 04/12/18  Yes Thurnell Lose, MD  ipratropium-albuterol (DUONEB) 0.5-2.5 (3) MG/3ML SOLN Take 3 mLs by nebulization every 6 (six) hours as needed (for shortness of breath or wheezing).    Yes [provider]  Multiple Vitamins-Minerals (THERA-M MULTIPLE VITAMINS PO) Take 1 tablet by mouth daily.   Yes  [provider]  nystatin (MYCOSTATIN/NYSTOP) powder Apply 1 g topically See admin instructions. Apply topically twice daily as needed for wound care  -  between folds, beneath breasts, bilateral axillae, and under pannus   Yes [provider]  polyethylene glycol (MIRALAX / GLYCOLAX) packet Take 17 g by mouth 2 (two) times daily. Patient taking differently: Take 17 g by mouth daily as needed for mild constipation.  12/24/17  Yes Lady Deutscher, MD  pravastatin (PRAVACHOL) 40 MG tablet Take 40 mg by mouth daily.   Yes [provider]  risperiDONE (RISPERDAL) 0.5 MG tablet Take 1 tablet (0.5 mg total) by mouth 2 (two) times daily. 12/23/17 07/19/18 Yes Purohit, Konrad Dolores, MD  silver sulfADIAZINE (SILVADENE) 1 % cream Apply topically 2 (two) times daily. 02/09/18  Yes Elgergawy, Silver Huguenin, MD  traMADol (ULTRAM) 50 MG tablet Take 50 mg by mouth 4 (four) times daily as needed (pain).    Yes [provider]  triamcinolone (KENALOG) 0.147 MG/GM topical spray Apply 1 spray topically See admin instructions. Apply topically to head in the direction of hair growth daily at bedtime   Yes [provider]  vitamin C (ASCORBIC ACID) 500 MG tablet Take 500 mg by mouth 2 (two) times daily.   Yes [provider]  Vitamin D, Ergocalciferol, (DRISDOL) 1.25 MG (50000 UT) CAPS capsule Take 50,000 Units by mouth every Monday.    Yes [provider]  bisacodyl (DULCOLAX) 5 MG EC tablet Take 1 tablet (5 mg total) by mouth daily as needed for moderate constipation. Patient not taking: Reported on 07/19/2018 12/24/17   Lady Deutscher, MD  cyanocobalamin (,VITAMIN B-12,) 1000 MCG/ML injection Inject 1 mL (1,000 mcg total) into the muscle once a week. Patient taking differently: Inject 1,000 mcg into the muscle every Monday.  02/09/18   Elgergawy, Silver Huguenin, MD  furosemide (LASIX) 20 MG tablet Take 20 mg by mouth daily.    [provider]  Zinc Sulfate  (ZINC-220 PO) Take 220 mg by mouth at bedtime.     [provider]    Family History Family History  Family history unknown: Yes    Social History Social History   Tobacco Use  . Smoking status: Former Research scientist (life sciences)  . Smokeless tobacco: Never Used  Substance Use Topics  . Alcohol use: No  . Drug use: No     Allergies   Lasix [furosemide]   Review of Systems Review of Systems  Unable to perform ROS: Acuity of condition     Physical Exam Updated Vital Signs BP (!) 167/87   Pulse 92   Temp (!) 102.9 F (39.4 C) (Rectal)   Resp (!) 27   Ht 5' 1"  (1.549 m)   Wt 101.6 kg   SpO2 100%   BMI 42.32 kg/m   Physical Exam Vitals signs and nursing note reviewed.  Constitutional:      Appearance: Normal appearance. She is well-developed.  HENT:     Head: Normocephalic and atraumatic.  Eyes:     General: Lids are normal.     Conjunctiva/sclera: Conjunctivae normal.     Pupils: Pupils are equal, round, and reactive to light.  Neck:     Musculoskeletal: Full passive range of motion without pain.  Cardiovascular:     Rate and Rhythm: Regular rhythm. Tachycardia present.     Pulses: Normal pulses.     Heart sounds: Normal heart sounds. No murmur. No friction rub. No gallop.   Pulmonary:     Effort: Tachypnea present.     Breath sounds: Normal breath sounds.     Comments: Lungs clear to auscultation bilaterally.  Symmetric chest rise.  No wheezing, rales, rhonchi. Increased work of breathing Abdominal:     Palpations: Abdomen is soft. Abdomen is not rigid.     Tenderness: There is abdominal tenderness. There is no guarding.     Comments: Abdomen soft, nondistended.  Diffuse tenderness noted to the lower abdomen with no focal point.  No rigidity, guarding.  Musculoskeletal: Normal range of motion.  Skin:    General: Skin is warm and dry.     Capillary Refill: Capillary refill takes less than 2 seconds.       Neurological:     Mental Status: She is alert and  oriented to person, place, and time.  Psychiatric:        Speech: Speech normal.        ED Treatments / Results  Labs (all labs ordered are listed, but only abnormal results are displayed) Labs Reviewed  LACTIC ACID, PLASMA - Abnormal; Notable for the following components:      Result Value   Lactic Acid, Venous 2.3 (*)    All other components within normal limits  COMPREHENSIVE METABOLIC PANEL - Abnormal; Notable for the following components:   Glucose, Bld 246 (*)    AST 112 (*)    ALT 66 (*)    Alkaline Phosphatase 477 (*)    Total Bilirubin 2.7 (*)    All other components within normal limits  CBC WITH DIFFERENTIAL/PLATELET - Abnormal; Notable for the following components:   WBC 10.7 (*)    Neutro Abs 8.9 (*)    All other components within normal limits  URINALYSIS, ROUTINE W REFLEX MICROSCOPIC - Abnormal; Notable for the following components:   Glucose, UA 150 (*)    Ketones, ur 20 (*)    Protein, ur 100 (*)    Bacteria, UA RARE (*)    All other components within normal limits  MAGNESIUM - Abnormal; Notable for the following components:   Magnesium 1.5 (*)    All other components within normal limits  CBG MONITORING, ED - Abnormal; Notable for the following components:   Glucose-Capillary 269 (*)    All other components within normal limits  CULTURE, BLOOD (ROUTINE X 2)  CULTURE, BLOOD (ROUTINE X 2)  SARS CORONAVIRUS 2  LIPASE, BLOOD  LACTIC ACID, PLASMA  TROPONIN I    EKG EKG Interpretation  Date/Time:  Friday July 19 2018 13:40:45 EDT Ventricular Rate:  96 PR Interval:    QRS Duration: 92 QT Interval:  390 QTC Calculation: 493 R Axis:   -60 Text Interpretation:  Incomplete analysis due to missing data in precordial lead(s) Sinus rhythm Left anterior fascicular block Abnormal R-wave progression, late transition ST elevation suggests acute pericarditis Borderline prolonged QT interval Missing lead(s): V2 Since last tracing V tach not seen Confirmed by  Noemi Chapel [phone]) on 07/19/2018 2:15:07 PM   Radiology Dg Chest Port 1 View  Result Date: 07/19/2018 CLINICAL DATA:  Chest pain.  Fever and vomiting. EXAM: PORTABLE CHEST 1 VIEW COMPARISON:  04/03/2018 FINDINGS: Artifact overlies the chest. There is a poor inspiration. Allowing for the poor inspiration, no active disease is appreciated. No infiltrate, collapse or effusion. No acute bone finding. Chronic degenerative changes of the shoulders. IMPRESSION: Poor inspiration.  No active disease evident allowing for that. Electronically Signed   By: Nelson Chimes M.D.   On: 07/19/2018 13:28    Procedures .Critical Care Performed by: Volanda Napoleon, PA-C Authorized by: Volanda Napoleon, PA-C   Critical care provider statement:    Critical care time (minutes):  45   Critical care was necessary to treat or prevent imminent or life-threatening deterioration of the following conditions:  Sepsis   Critical care was time spent personally by me on the following activities:  Discussions with consultants, evaluation of patient's response to treatment, examination of patient, ordering and performing treatments and interventions, ordering and review of laboratory studies, ordering and review of radiographic studies, pulse oximetry, re-evaluation of patient's condition, obtaining history from patient or surrogate and review of old charts   (including critical care time)  Medications Ordered in ED Medications  amiodarone (NEXTERONE PREMIX) 360-4.14 MG/200ML-% (1.8 mg/mL) IV infusion (60 mg/hr Intravenous New Bag/Given  07/19/18 1309)    Followed by  amiodarone (NEXTERONE PREMIX) 360-4.14 MG/200ML-% (1.8 mg/mL) IV infusion (has no administration in time range)  acetaminophen (TYLENOL) tablet 1,000 mg (1,000 mg Oral Not Given 07/19/18 1415)  metroNIDAZOLE (FLAGYL) IVPB 500 mg (has no administration in time range)  vancomycin (VANCOCIN) 2,000 mg in sodium chloride 0.9 % 500 mL IVPB (has no administration in  time range)  ceFEPIme (MAXIPIME) 2 g in sodium chloride 0.9 % 100 mL IVPB (has no administration in time range)  vancomycin (VANCOCIN) 1,500 mg in sodium chloride 0.9 % 500 mL IVPB (has no administration in time range)  magnesium sulfate IVPB 2 g 50 mL (has no administration in time range)  sodium chloride flush (NS) 0.9 % injection 3 mL (3 mLs Intravenous Given During Downtime 07/19/18 1307)  amiodarone (NEXTERONE) 1.8 mg/mL load via infusion 150 mg (150 mg Intravenous Bolus from Bag 07/19/18 1308)  acetaminophen (TYLENOL) tablet 1,000 mg (1,000 mg Oral Given 07/19/18 1354)  ceFEPIme (MAXIPIME) 2 g in sodium chloride 0.9 % 100 mL IVPB (2 g Intravenous Bolus from Bag 07/19/18 1400)  sodium chloride 0.9 % bolus 1,000 mL (1,000 mLs Intravenous Bolus from Bag 07/19/18 1429)     Initial Impression / Assessment and Plan / ED Course  I have reviewed the triage vital signs and the nursing notes.  Pertinent labs & imaging results that were available during my care of the patient were reviewed by me and considered in my medical decision making (see chart for details).        68 year old female who presents for evaluation of abdominal pain, nausea/vomiting that began yesterday.  Initially arrival, patient is febrile, tachycardic, hypertensive, tachypneic.  On my initial arrival, patient appeared to be had runs of V. tach that lasted about 7-10 beats.  Exam, she is diffusely tender in lower abdomen.  She has a diffuse area of erythema and warmth noted back with small superficial tunnel.  With concerns of V. tach, amiodarone bolus and drip were ordered.  Sepsis initiated with broad-spectrum antibiotics.  Lactic elevated at 2.3.  CBG is 269.  Lipase unremarkable.  CMP shows normal potassium.  BUN and creatinine within normal limits.  AST and ALT are elevated at 112, 66.  Alk phos elevated at 477, total bili at 2.7.  CBC with leukocytosis of 10.7.  Chest x-ray shows no infectious etiology.  Given elevation LFTs,  plan for ultrasound of right upper quadrant for evaluation of any hepatobiliary allergy.  Additionally, given cellulitic component on back, will plan for CT scan for evaluation of any intra-abdominal pathology.  UA negative for any infectious etiology.  Discussed patient with etiology.  They will plan to consult on patient.  Cardiology recommends continuing amiodarone drip to manage V. Tach.  Patient signed out to Otho Perl, PA-C with imaging pending. Given patient's severity of illness, will require admission once CT scan and ultrasound are back.  Portions of this note were generated with [employer]. Dictation errors may occur despite best attempts at proofreading.   Final Clinical Impressions(s) / ED Diagnoses   Final diagnoses:  Abdominal pain  Sepsis, due to unspecified organism, unspecified whether acute organ dysfunction present [facility])    ED Discharge Orders    None       Desma Mcgregor 07/19/18 1542    Noemi Chapel, MD 07/20/18 [phone]

## 2018-07-19 NOTE — ED Provider Notes (Signed)
Care assumed from [facility], [place], at shift change, please see their notes for full documentation of patient's complaint/HPI. Briefly, pt here with abdominal pain, nausea, and vomiting x 1 day. Pt was septic on presentation with fever 102.9 as well as going in and out of vtach; placed on amio drip and broad spectrum antibiotics. Results so far show elevated leukocytosis at 10.7, lactic acid 2.3, with increase in t bili and transaminases. Awaiting RUQ ultrasound as well as CT A/P; pt has what appears to be a cellulitis on her lower back with possible fistula. Plan is to admit patient after images return. Cardiology is involved with patient; recommended continue amio dip.   Physical Exam  BP (!) 167/87   Pulse 92   Temp (!) 102.9 F (39.4 C) (Rectal)   Resp (!) 27   Ht 5\' 1"  (1.549 m)   Wt 101.6 kg   SpO2 100%   BMI 42.32 kg/m   Physical Exam Vitals signs and nursing note reviewed.  Constitutional:      Appearance: She is not ill-appearing.  HENT:     Head: Normocephalic and atraumatic.  Eyes:     Conjunctiva/sclera: Conjunctivae normal.  Cardiovascular:     Rate and Rhythm: Regular rhythm. Tachycardia present.  Pulmonary:     Effort: Tachypnea present.  Abdominal:     Tenderness: There is abdominal tenderness.  Skin:    General: Skin is warm and dry.     Coloration: Skin is not jaundiced.  Neurological:     Mental Status: She is alert.      ED Course/Procedures   Clinical Course as of Jul 20 18  Fri Jul 19, 2018  1913 Discussed case with hospitalist Dr. Harlen Labs who will admit patient; cardiology following   [MV]    Clinical Course User Index [MV] Eustaquio Maize, PA-C    RUQ ultrasound nondiagnostic; awaiting CT A/P at this time. Troponin elevated at 0.14 which is to be expected with pt going in and out of vtach.   CT negative at this time as well; unsure what cause of patient's sepsis is. Regardless she will need to be admitted; will consult hospitalist for  admission.   MDM Number of Diagnoses or Management Options Abdominal pain:  Sepsis, due to unspecified organism, unspecified whether acute organ dysfunction present [facility]):  V-tach [facility]):        Eustaquio Maize, PA-C 07/20/18 0020    Noemi Chapel, MD 07/20/18 [phone]

## 2018-07-19 NOTE — H&P (Signed)
History and Physical:    Heather Petty   PIR:[id number] DOB: [birth year] DOA: 07/19/2018  Referring MD/provider: PA Brett Canales PCP: [name], MD   Patient coming from: Home  Chief Complaint: Fevers nausea and vomiting  History of Present Illness:   Heather Petty is an 68 y.o. 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  She did have some malaise.  This morning patient had nausea followed by vomiting and said that she could not stop vomiting.  Did have some mid abdominal pain with the vomitus which resolved when she stopped vomiting.  She denies any blood in her vomitus.  She denies any diarrhea. Patient lives at home alone but apparently has a CNA that comes and visits her daily.  When the CNA arrived and saw her vomiting she called the ambulance.  Review of systems is positive for intermittent pounding in the chest which patient states that she has had for a while.  She denies any chest pain but does admit to bilateral shoulder pain which she also says that she has had intermittently over the past several months. She admits to polyuria but denies any dysuria urgency or frequency.  Patient denies any new medications.  She denies any alcohol use.  Patient also notes that she has had a chronic back wound which a nurse has come in weekly to dress.  She notes that the dressing came off yesterday because she was sweating so much.  Denies cough or shortness of breath different from baseline.  Patient notes that she is able to transfer from her bed to a wheelchair and wheel herself around her house admits she does not move around very much.  She notes her CNA gets her her meals.  Patient  denies any headache photophobia dizziness.  ED Course:  The patient was noted to be febrile with no real leukocytosis, had tachycardia but normal blood pressures and an minimally elevated lactate level which improved with hydration.  Her fever work-up was unremarkable and no clear source of her fever has yet been found.  In addition, the patient was also noted to have sustained and NSVT on monitoring along with an elevated troponin of 0.14.  She was seen by cardiology who started an amiodarone drip.  ROS:   ROS   Review of Systems: Eyes: no discharge, redness, pain HENT: no ear pain, hearing loss, drainage, tinnitus Respiratory: Denies cough,, shortness of breath, hemoptysis CNS: No numbness, dizziness, headache Musculoskeletal: No back pain, joint pain Blood/lymphatics: No easy bruising, bleeding   Past Medical History:   Past Medical History:  Diagnosis Date  . AKI (acute kidney injury) ([place]) 12/17/2017  . Arthritis   . Asthma   . Depression   . Diabetes mellitus   . Gout   . High cholesterol   . Hypertension   . Morbid obesity ([place])   . Pneumonia 12/17/2017  . Schizophrenia ([place])   . Stroke [facility])     Past Surgical History:   Past Surgical History:  Procedure Laterality Date  . CYST EXCISION    . DENTAL SURGERY    . TONSILLECTOMY      Social History:   Social History   Socioeconomic History  . Marital status: Single  Spouse name: Not on file  . Number of children: Not on file  . Years of education: Not on file  . Highest education level: Not on file  Occupational History  . Not on file  Social Needs  . Financial resource strain: Not on file  . Food insecurity    Worry: Not on file    Inability: Not on file  . Transportation needs    Medical: Not on file    Non-medical: Not on file  Tobacco Use  . Smoking status: Former Research scientist (life sciences)  . Smokeless tobacco: Never Used  Substance and Sexual Activity  . Alcohol use: No  . Drug use: No  . Sexual activity:  Not on file  Lifestyle  . Physical activity    Days per week: Not on file    Minutes per session: Not on file  . Stress: Not on file  Relationships  . Social Herbalist on phone: Not on file    Gets together: Not on file    Attends religious service: Not on file    Active member of club or organization: Not on file    Attends meetings of clubs or organizations: Not on file    Relationship status: Not on file  . Intimate partner violence    Fear of current or ex partner: Not on file    Emotionally abused: Not on file    Physically abused: Not on file    Forced sexual activity: Not on file  Other Topics Concern  . Not on file  Social History Narrative  . Not on file    Allergies   Lasix [furosemide]  Family history:   Family History  Family history unknown: Yes    Current Medications:   Prior to Admission medications   Medication Sig Start Date End Date Taking? Authorizing Provider  acetaminophen (TYLENOL) 325 MG tablet Take 650 mg by mouth 4 (four) times daily as needed for headache (pain).   Yes [provider]  albuterol (VENTOLIN HFA) 108 (90 Base) MCG/ACT inhaler Inhale 2 puffs into the lungs every 4 (four) hours as needed for wheezing or shortness of breath.   Yes [provider]  allopurinol (ZYLOPRIM) 300 MG tablet Take 300 mg by mouth daily.   Yes [provider]  amLODipine (NORVASC) 10 MG tablet Take 10 mg by mouth daily.   Yes [provider]  antiseptic oral rinse (BIOTENE) LIQD 15 mLs by Mouth Rinse route 3 (three) times daily. Swish and spit   Yes [provider]  Azelastine-Fluticasone (DYMISTA) 137-50 MCG/ACT SUSP Place 1 spray into both nostrils 2 (two) times daily as needed (for allergies).    Yes [provider]  b complex vitamins tablet Take 1 tablet by mouth daily.   Yes [provider]  benazepril (LOTENSIN) 20 MG tablet Take 20 mg by mouth daily.   Yes [provider]   cetirizine (ZYRTEC) 10 MG tablet Take 10 mg by mouth daily.    Yes [provider]  colchicine 0.6 MG tablet Take 1 tablet (0.6 mg total) by mouth 2 (two) times daily. 04/05/18  Yes Ghimire, Henreitta Leber, MD  collagenase (SANTYL) ointment Apply topically daily. Patient taking differently: Apply 1 application topically See admin instructions. Clean with Dakin's Solution (half strength), apply skin prep periwound. Apply Santyl Ointment to necrotic tissue in wound bed, cover with Calcium Alginate, cover with bordered foam dressing daily. 02/09/18  Yes Elgergawy, Silver Huguenin, MD  Colloidal Oatmeal (  EUCERIN ECZEMA RELIEF) 1 % CREA Apply 1 application topically 2 (two) times daily.   Yes [provider]  doxepin (SINEQUAN) 25 MG capsule Take 25 mg by mouth 2 (two) times daily.    Yes [provider]  famotidine (PEPCID) 20 MG tablet Take 20 mg by mouth at bedtime.    Yes [provider]  folic acid (FOLVITE) 1 MG tablet Take 1 mg by mouth daily.   Yes [provider]  gabapentin (NEURONTIN) 300 MG capsule Take 300 mg by mouth 3 (three) times daily.   Yes [provider]  hydrocortisone 2.5 % cream Apply 1 application topically See admin instructions. Apply topically to rash on face twice daily   Yes [provider]  hydrOXYzine (ATARAX/VISTARIL) 25 MG tablet Take 25 mg by mouth 3 (three) times daily.   Yes [provider]  insulin aspart (NOVOLOG) 100 UNIT/ML injection Take subcu QA CHS, 140-199 - 2 units, 200-250 - 6 units, 251-299 - 8 units,  300-349 - 10 units,  350 or above 14 units. Patient taking differently: Inject 0-12 Units into the skin See admin instructions. Inject 0-12 units into the skin 4 times a day (before meals and at bedtime) per sliding scale: BGL <60 or >400  = CALL MD; 60-200 = 0 units; 201-250 = 2 units; 251-300 = 4 units; 301-350 = 6 units; 351-400 = 8 units; 401-450 = 10 units; >450 = 12 units 03/05/18  Yes Thurnell Lose, MD  insulin glargine (LANTUS) 100 UNIT/ML injection Inject 0.07 mLs (7 Units total) into the skin at bedtime. 04/12/18  Yes Thurnell Lose, MD  ipratropium-albuterol (DUONEB) 0.5-2.5 (3) MG/3ML SOLN Take 3 mLs by nebulization every 6 (six) hours as needed (for shortness of breath or wheezing).    Yes [provider]  Multiple Vitamins-Minerals (THERA-M MULTIPLE VITAMINS PO) Take 1 tablet by mouth daily.   Yes [provider]  nystatin (MYCOSTATIN/NYSTOP) powder Apply 1 g topically See admin instructions. Apply topically twice daily as needed for wound care  -  between folds, beneath breasts, bilateral axillae, and under pannus   Yes [provider]  polyethylene glycol (MIRALAX / GLYCOLAX) packet Take 17 g by mouth 2 (two) times daily. Patient taking differently: Take 17 g by mouth daily as needed for mild constipation.  12/24/17  Yes Lady Deutscher, MD  pravastatin (PRAVACHOL) 40 MG tablet Take 40 mg by mouth daily.   Yes [provider]  risperiDONE (RISPERDAL) 0.5 MG tablet Take 1 tablet (0.5 mg total) by mouth 2 (two) times daily. 12/23/17 07/19/18 Yes Purohit, Konrad Dolores, MD  silver sulfADIAZINE (SILVADENE) 1 % cream Apply topically 2 (two) times daily. 02/09/18  Yes Elgergawy, Silver Huguenin, MD  traMADol (ULTRAM) 50 MG tablet Take 50 mg by mouth 4 (four) times daily as needed (pain).    Yes [provider]  triamcinolone (KENALOG) 0.147 MG/GM topical spray Apply 1 spray topically See admin instructions. Apply topically to head in the direction of hair growth daily at bedtime   Yes [provider]  vitamin C (ASCORBIC ACID) 500 MG tablet Take 500 mg by mouth 2 (two) times daily.   Yes [provider]  Vitamin D, Ergocalciferol, (DRISDOL) 1.25 MG (50000 UT) CAPS capsule Take 50,000 Units by mouth every Monday.    Yes [provider]  bisacodyl (DULCOLAX) 5 MG EC tablet Take 1 tablet (5 mg total) by mouth daily as needed for  moderate constipation. Patient  not taking: Reported on 07/19/2018 12/24/17   Lady Deutscher, MD  cyanocobalamin (,VITAMIN B-12,) 1000 MCG/ML injection Inject 1 mL (1,000 mcg total) into the muscle once a week. Patient taking differently: Inject 1,000 mcg into the muscle every Monday.  02/09/18   Elgergawy, Silver Huguenin, MD  furosemide (LASIX) 20 MG tablet Take 20 mg by mouth daily.    [provider]  Zinc Sulfate (ZINC-220 PO) Take 220 mg by mouth at bedtime.     [provider]    Physical Exam:   Vitals:   07/19/18 1700 07/19/18 1715 07/19/18 1834 07/19/18 1845  BP: (!) 155/66 (!) 145/70 (!) 145/61 136/81  Pulse: 73 75 76 76  Resp: (!) 21 (!) 30 (!) 26 (!) 26  Temp:      TempSrc:      SpO2: 94% 93% 97% 95%  Weight:      Height:         Physical Exam: Blood pressure 136/81, pulse 76, temperature (!) 102.9 F (39.4 C), temperature source Rectal, resp. rate (!) 26, height 5' 1"  (1.549 m), weight 101.6 kg, SpO2 95 %. Gen: Obese, relatively well-appearing female lying flat in bed in no acute distress looking much older than stated age. Eyes: Sclerae anicteric. Conjunctiva mildly injected. Chest: Moderately good air entry bilaterally with no adventitious sounds.  CV: Distant, irregular, no audible murmurs. Abdomen: Obese, NABS, soft, nondistended, nontender. No tenderness to light or deep palpation. No rebound, no guarding.  Murphy sign negative. Extremities: No edema.  Back: Patient does have a well-healed wound on her left buttock.  She also has a deeper wound in sacral area that does not appear grossly cellulitic however it is possible that there is a fistula there.  There appears to be some clear drainage.  Not foul-smelling. Neuro: Alert and oriented times 3; grossly nonfocal. Psych: Patient is cooperative, logical and coherent..  Data Review:    Labs: Basic Metabolic Panel: Recent Labs  Lab 07/19/18 1248 07/19/18 1400  NA 137  --   K 4.5  --   CL 102   --   CO2 22  --   GLUCOSE 246*  --   BUN 8  --   CREATININE 0.75  --   CALCIUM 9.5  --   MG  --  1.5*   Liver Function Tests: Recent Labs  Lab 07/19/18 1248  AST 112*  ALT 66*  ALKPHOS 477*  BILITOT 2.7*  PROT 6.9  ALBUMIN 3.7   Recent Labs  Lab 07/19/18 1248  LIPASE 22   No results for input(s): AMMONIA in the last 168 hours. CBC: Recent Labs  Lab 07/19/18 1248  WBC 10.7*  NEUTROABS 8.9*  HGB 13.9  HCT 43.1  MCV 95.4  PLT 216   Cardiac Enzymes: Recent Labs  Lab 07/19/18 1530 07/19/18 1729  TROPONINI 0.14* 0.14*    BNP (last 3 results) No results for input(s): PROBNP in the last 8760 hours. CBG: Recent Labs  Lab 07/19/18 1306  GLUCAP 269*    Urinalysis    Component Value Date/Time   COLORURINE YELLOW 07/19/2018 1459   APPEARANCEUR CLEAR 07/19/2018 1459   LABSPEC 1.011 07/19/2018 1459   PHURINE 6.0 07/19/2018 1459   GLUCOSEU 150 (A) 07/19/2018 1459   HGBUR NEGATIVE 07/19/2018 1459   BILIRUBINUR NEGATIVE 07/19/2018 1459   KETONESUR 20 (A) 07/19/2018 1459   PROTEINUR 100 (A) 07/19/2018 1459   UROBILINOGEN 0.2 12/27/2010 1858   NITRITE NEGATIVE 07/19/2018 1459   LEUKOCYTESUR  NEGATIVE 07/19/2018 1459      Radiographic Studies: Ct Abdomen Pelvis W Contrast  Result Date: 07/19/2018 CLINICAL DATA:  Fever with nausea and vomiting.  Mid abdominal pain. EXAM: CT ABDOMEN AND PELVIS WITH CONTRAST TECHNIQUE: Multidetector CT imaging of the abdomen and pelvis was performed using the standard protocol following bolus administration of intravenous contrast. CONTRAST:  117m OMNIPAQUE IOHEXOL 300 MG/ML  SOLN COMPARISON:  Current abdomen ultrasound.  Prior CT dated 12/17/2017. FINDINGS: Lower chest: No acute abnormality. Hepatobiliary: Diffusely decreased attenuation of the liver. Liver normal in size. No mass or focal lesion. Normal gallbladder. No bile duct dilation. Pancreas: Unremarkable. No pancreatic ductal dilatation or surrounding inflammatory changes.  Spleen: Normal in size without focal abnormality. Adrenals/Urinary Tract: Adrenal glands are unremarkable. Kidneys are normal, without renal calculi, focal lesion, or hydronephrosis. Bladder is unremarkable. Stomach/Bowel: Stomach mostly decompressed but otherwise unremarkable. Small bowel and colon are normal in caliber. No wall thickening or inflammation. There scattered colonic diverticula without diverticulitis. Normal appendix visualized. Vascular/Lymphatic: Dense aortic atherosclerotic calcifications. No aneurysm. No enlarged lymph nodes. Reproductive: Uterus and bilateral adnexa are unremarkable. Other: No abdominal wall hernia or abnormality. No abdominopelvic ascites. Musculoskeletal: No fracture or acute finding. No osteoblastic or osteolytic lesions. IMPRESSION: 1. No acute findings. No findings to account for the patient's symptoms. 2. Hepatic steatosis. 3. Colonic diverticula without diverticulitis. 4. Aortic atherosclerosis. Electronically Signed   By: DLajean ManesM.D.   On: 07/19/2018 18:13   Dg Chest Port 1 View  Result Date: 07/19/2018 CLINICAL DATA:  Chest pain.  Fever and vomiting. EXAM: PORTABLE CHEST 1 VIEW COMPARISON:  04/03/2018 FINDINGS: Artifact overlies the chest. There is a poor inspiration. Allowing for the poor inspiration, no active disease is appreciated. No infiltrate, collapse or effusion. No acute bone finding. Chronic degenerative changes of the shoulders. IMPRESSION: Poor inspiration.  No active disease evident allowing for that. Electronically Signed   By: MNelson ChimesM.D.   On: 07/19/2018 13:28   [place]orea[place]domen Limited Ruq  Result Date: 07/19/2018 CLINICAL DATA:  Abdominal pain for 1 day. EXAM: ULTRASOUND ABDOMEN LIMITED RIGHT UPPER QUADRANT COMPARISON:  03/16/2018 FINDINGS: Gallbladder: Small polyp versus a nonshadowing stone along the dependent wall of the gallbladder, measuring 6 mm. No other evidence of a stone. No wall thickening or pericholecystic fluid. Common bile  duct: Diameter: 6 mm Liver: Increased parenchymal echogenicity with poor through transmission of the sound beam. No mass or focal lesion. Portal vein is patent on color Doppler imaging with normal direction of blood flow towards the liver. IMPRESSION: 1. No acute findings.  No evidence of acute cholecystitis. 2. Single small, 6 mm, gallbladder polyp versus a nonshadowing stone. 3. Extensive hepatic steatosis. Electronically Signed   By: DLajean ManesM.D.   On: 07/19/2018 16:44    EKG: Independently reviewed.  Sinus rhythm at 100.  Very poor baseline.  Left axis deviation.  No acute ST-T wave changes although she has nonspecific T wave changes in V1 and V2   Assessment/Plan:   Principal Problem:   Sepsis ([place] Active Problems:   Type II diabetes mellitus with renal manifestations (HCC)   HTN (hypertension)   Paranoid schizophrenia (HCC)   Open back wound   Lactic acidosis   Ventricular tachycardia, sustained (HCC)   NSTEMI (non-ST elevated myocardial infarction) ([facility]   68year old female presents with fever, nausea and vomiting with mild elevation in transaminases and her bilirubin.  She does have an elevated lactate however blood pressures are stable.  Abdominal CT  and right upper quadrant ultrasound are negative for acute cholecystitis or intra-abdominal infection.  He is also noted to have sustained an NSVT as well as an elevation in troponin at 0.14.  She is hemodynamically stable  FEVER Etiology of fever is not entirely clear although I suspect her wound in her sacral area with possible fistula is the likely source.  Urine and chest x-ray are negative. She does have rising LFTs however CT and ultrasound reveal only steatohepatitis which is the likely cause of the slowly increasing transaminases and alk phos. Will continue broad-spectrum antibiotics started in the ED including vancomycin and cefepime until we have a clear source.  Blood cultures pending.  SACRAL WOUND I was unable  to get a great look at whether the wound actually has a fistula versus of a blind end. Does not appear to be any clear surrounding cellulitis. I have requested wound nurse to see the patient. Could consider getting a fistulogram versus other back imaging, however will defer further work-up to treatment team. Of note patient has a history of superficial VRE on buttock wound which seems to be healing well.  [facility] Patient seen by cardiology who note that she has both sustained and nonsustained polymorphic VT.   Amiodarone drip started per their recommendations Continue magnesium replacement, will order a second bolus of 2 g magnesium Metoprolol 25 p.o. twice daily started as well per their recommendation  NSTEMI Patient with an elevated troponin at 0.14 which is stable on repeat.EKG does not reveal any acute ST-T wave changes other than nonspecific changes. Per cardiology no ischemic work-up is necessary at this point as this is likely secondary to intermittent VT. Metoprolol started as noted above. We will continue to follow troponi n every 6 hours. Echocardiogram ordered.  NAUSEA/VOMITING/ABNORMAL LFTS We will treat patient's nausea and vomiting conservatively, keep patient n.p.o. I suspect nausea vomiting may be secondary to fever and infection Though colchicine and tramadol can cause N/V, these are not new drugs so less likely to be cause I suspect rising LFTs are secondary to steatohepatitis as noted above, however will hold statin. Patient denies any alcohol use  DM2 Patient apparently is only on 7 units of glargine and then sliding scale aspart As patient is n.p.o., will decrease glargine to 4 units at bedtime Continue to cover with sliding scale insulin  HTN Continue amlodipine and benazepril Metoprolol started per cardiology recommendations Will hold Lasix  GOUT Continue allopurinol and colchicine No evidence of acute flare  SCHIZOPHRENIA Continue risperidone,  hydroxyzine and doxepin.    Other information:   DVT prophylaxis: Lovenox ordered. Code Status: Full code. Family Communication: Patient states she is not on speaking terms with her daughter Disposition Plan: Home with home health care versus placement Consults called: None Admission status: Inpatient  The medical decision making on this patient was of high complexity and the patient is at high risk for clinical deterioration, therefore this is a level 3 visit.    Dewaine Oats Tublu Chatterjee [facility]  If 7PM-7AM, please contact night-coverage [email or web address] Password [facility] 07/19/2018, 7:29 PM

## 2018-07-19 NOTE — ED Notes (Addendum)
ED TO INPATIENT HANDOFF REPORT  ED Nurse Name and Phone #:  Otila Kluver @ [phone]  S Name/Age/Gender [name] 68 y.o. female Room/Bed: 030C/030C  Code Status   Code Status: Prior  Home/SNF/Other Home Patient oriented to: self, place, time and situation Is this baseline? Yes   Triage Complete: Triage complete  Chief Complaint N/V; Fever  Triage Note Per EMS- pt here from home, speaks [place] and spanish. Pt arrives complaining of fever X 2 days, with nausea and vomiting. Pt also endorsees mid abdominal pain. Pt febrile and brady.    Allergies Allergies  Allergen Reactions  . Lasix [Furosemide] Other (See Comments)    IV-LASIX ==> Reaction: Burning    Level of Care/Admitting Diagnosis ED Disposition    ED Disposition Condition [facility] Area: [place] [[id number]]  Level of Care: Progressive [102]  Covid Evaluation: Confirmed COVID Negative  Diagnosis: Sepsis [facility]) [[id number]]  Admitting Physician: Vashti Hey [[id number]]  Attending Physician: Vashti Hey [[id number]]  Estimated length of stay: past midnight tomorrow  Certification:: I certify this patient will need inpatient services for at least 2 midnights  PT Class (Do Not Modify): Inpatient [101]  PT Acc Code (Do Not Modify): Private [1]       B Medical/Surgery History Past Medical History:  Diagnosis Date  . AKI (acute kidney injury) ([place]) 12/17/2017  . Arthritis   . Asthma   . Depression   . Diabetes mellitus   . Gout   . High cholesterol   . Hypertension   . Morbid obesity ([place])   . Pneumonia 12/17/2017  . Schizophrenia ([place])   . Stroke [facility])    Past Surgical History:  Procedure Laterality Date  . CYST EXCISION    . DENTAL SURGERY    . TONSILLECTOMY       A IV Location/Drains/Wounds Patient Lines/Drains/Airways Status   Active Line/Drains/Airways    Name:   Placement date:   Placement time:   Site:   Days:   Peripheral IV  07/19/18 Left Forearm   07/19/18    1305    Forearm   less than 1   Midline Single Lumen 07/19/18 Midline Right Cephalic 8 cm 0 cm   50/35/46    5681    Cephalic   less than 1   Pressure Injury 03/15/18 Stage II -  Partial thickness loss of dermis presenting as a shallow open ulcer with a red, pink wound bed without slough.   03/15/18    0030     126   Pressure Injury 03/15/18 Stage III -  Full thickness tissue loss. Subcutaneous fat may be visible but bone, tendon or muscle are NOT exposed. this is a full thickness wound related to MASD not a pressure injury    03/15/18    0030     126   Pressure Injury 03/15/18 Stage III -  Full thickness tissue loss. Subcutaneous fat may be visible but bone, tendon or muscle are NOT exposed. this is a full thickness wound related to MASD not a pressure injury    03/15/18    0030     126   Pressure Injury 03/15/18 Unstageable - Full thickness tissue loss in which the base of the ulcer is covered by slough (yellow, tan, gray, green or brown) and/or eschar (tan, brown or black) in the wound bed. 3 unstageable wounds on Left buttocks   03/15/18    0030     126  Pressure Injury 03/15/18 Unstageable - Full thickness tissue loss in which the base of the ulcer is covered by slough (yellow, tan, gray, green or brown) and/or eschar (tan, brown or black) in the wound bed.   03/15/18    0030     126   Wound / Incision (Open or Dehisced) 03/15/18 Arm Anterior;Left;Proximal;Upper Eschar    03/15/18    0030    Arm   126          Intake/Output Last 24 hours No intake or output data in the 24 hours ending 07/19/18 1922  Labs/Imaging Results for orders placed or performed during the hospital encounter of 07/19/18 (from the past 48 hour(s))  Lactic acid, plasma     Status: Abnormal   Collection Time: 07/19/18 12:48 PM  Result Value Ref Range   Lactic Acid, Venous 2.3 (HH) 0.5 - 1.9 mmol/L    Comment: CRITICAL RESULT CALLED TO, READ BACK BY AND VERIFIED WITH: PULLIAM,P RN @  0109 07/19/18 LEONARD,A Performed at [facility], [place] [address] [place], [place] [address]   Comprehensive metabolic panel     Status: Abnormal   Collection Time: 07/19/18 12:48 PM  Result Value Ref Range   Sodium 137 135 - 145 mmol/L   Potassium 4.5 3.5 - 5.1 mmol/L   Chloride 102 98 - 111 mmol/L   CO2 22 22 - 32 mmol/L   Glucose, Bld 246 (H) 70 - 99 mg/dL   BUN 8 8 - 23 mg/dL   Creatinine, Ser 0.75 0.44 - 1.00 mg/dL   Calcium 9.5 8.9 - 10.3 mg/dL   Total Protein 6.9 6.5 - 8.1 g/dL   Albumin 3.7 3.5 - 5.0 g/dL   AST 112 (H) 15 - 41 U/L   ALT 66 (H) 0 - 44 U/L   Alkaline Phosphatase 477 (H) 38 - 126 U/L   Total Bilirubin 2.7 (H) 0.3 - 1.2 mg/dL   GFR calc non Af Amer >60 >60 mL/min   GFR calc Af Amer >60 >60 mL/min   Anion gap 13 5 - 15    Comment: Performed at [facility] [address] [place], [place] [address]  CBC with Differential     Status: Abnormal   Collection Time: 07/19/18 12:48 PM  Result Value Ref Range   WBC 10.7 (H) 4.0 - 10.5 K/uL   RBC 4.52 3.87 - 5.11 MIL/uL   Hemoglobin 13.9 12.0 - 15.0 g/dL   HCT 43.1 36.0 - 46.0 %   MCV 95.4 80.0 - 100.0 fL   MCH 30.8 26.0 - 34.0 pg   MCHC 32.3 30.0 - 36.0 g/dL   RDW 13.2 11.5 - 15.5 %   Platelets 216 150 - 400 K/uL   nRBC 0.0 0.0 - 0.2 %   Neutrophils Relative % 83 %   Neutro Abs 8.9 (H) 1.7 - 7.7 K/uL   Lymphocytes Relative 8 %   Lymphs Abs 0.9 0.7 - 4.0 K/uL   Monocytes Relative 7 %   Monocytes Absolute 0.8 0.1 - 1.0 K/uL   Eosinophils Relative 1 %   Eosinophils Absolute 0.1 0.0 - 0.5 K/uL   Basophils Relative 0 %   Basophils Absolute 0.0 0.0 - 0.1 K/uL   Immature Granulocytes 1 %   Abs Immature Granulocytes 0.05 0.00 - 0.07 K/uL    Comment: Performed at [facility] [address], [place], [place] [address]  Lipase, blood     Status: None   Collection  Time: 07/19/18 12:48 PM  Result Value Ref Range   Lipase 22 11 - 51 U/L    Comment: Performed at [facility], [place]  [address], [place], [place] [address]  CBG monitoring, ED     Status: Abnormal   Collection Time: 07/19/18  1:06 PM  Result Value Ref Range   Glucose-Capillary 269 (H) 70 - 99 mg/dL  SARS Coronavirus 2     Status: None   Collection Time: 07/19/18  1:09 PM  Result Value Ref Range   SARS Coronavirus 2 NOT DETECTED NOT DETECTED    Comment: (NOTE) SARS-CoV-2 target nucleic acids are NOT DETECTED. The SARS-CoV-2 RNA is generally detectable in upper and lower respiratory specimens during the acute phase of infection.  Negative  results do not preclude SARS-CoV-2 infection, do not rule out co-infections with other pathogens, and should not be used as the sole basis for treatment or other patient management decisions.  Negative results must be combined with clinical observations, patient history, and epidemiological information. The expected result is Not Detected. Fact Sheet for Patients: http://www.biofiredefense.com/wp-content/uploads/2020/03/BIOFIRE-COVID -19-patients.pdf Fact Sheet for Healthcare Providers: http://www.biofiredefense.com/wp-content/uploads/2020/03/BIOFIRE-COVID -19-hcp.pdf This test is not yet approved or cleared by the [place] and  has been authorized for detection and/or diagnosis of SARS-CoV-2 by FDA under an Emergency Use Authorization (EUA).  This EUA will remain in effec t (meaning this test can be used) for the duration of  the COVID-19 declaration under Section 564(b)(1) of the Act, 21 U.S.C. section 360bbb-3(b)(1), unless the authorization is terminated or revoked sooner. Performed at [facility], [place] [address], [place], [place] [address]   Magnesium     Status: Abnormal   Collection Time: 07/19/18  2:00 PM  Result Value Ref Range   Magnesium 1.5 (L) 1.7 - 2.4 mg/dL    Comment: Performed at [facility] [address], [place], [place] [address]  Urinalysis, Routine w reflex microscopic     Status: Abnormal   Collection Time: 07/19/18   2:59 PM  Result Value Ref Range   Color, Urine YELLOW YELLOW   APPearance CLEAR CLEAR   Specific Gravity, Urine 1.011 1.005 - 1.030   pH 6.0 5.0 - 8.0   Glucose, UA 150 (A) NEGATIVE mg/dL   Hgb urine dipstick NEGATIVE NEGATIVE   Bilirubin Urine NEGATIVE NEGATIVE   Ketones, ur 20 (A) NEGATIVE mg/dL   Protein, ur 100 (A) NEGATIVE mg/dL   Nitrite NEGATIVE NEGATIVE   Leukocytes,Ua NEGATIVE NEGATIVE   RBC / HPF 0-5 0 - 5 RBC/hpf   WBC, UA 0-5 0 - 5 WBC/hpf   Bacteria, UA RARE (A) NONE SEEN   Squamous Epithelial / LPF 0-5 0 - 5    Comment: Performed at [facility], [place] [address], [place], [place] [address]  Lactic acid, plasma     Status: None   Collection Time: 07/19/18  3:30 PM  Result Value Ref Range   Lactic Acid, Venous 1.4 0.5 - 1.9 mmol/L    Comment: Performed at [facility] [address], [place], [place] [address]  Troponin I - ONCE - STAT     Status: Abnormal   Collection Time: 07/19/18  3:30 PM  Result Value Ref Range   Troponin I 0.14 (HH) <0.03 ng/mL    Comment: CRITICAL RESULT CALLED TO, READ BACK BY AND VERIFIED WITH: P PULLIAM,RN 1630 07/19/2018 D BRADLEY Performed at [facility], [place] [address] [place], [place] [address]  Troponin I - Now Then Q6H     Status: Abnormal   Collection Time: 07/19/18  5:29 PM  Result Value Ref Range   Troponin I 0.14 (HH) <0.03 ng/mL    Comment: CRITICAL VALUE NOTED.  VALUE IS CONSISTENT WITH PREVIOUSLY REPORTED AND CALLED VALUE. Performed at [facility], [place] [address] [place], [place] [address]    Ct Abdomen Pelvis W Contrast  Result Date: 07/19/2018 CLINICAL DATA:  Fever with nausea and vomiting.  Mid abdominal pain. EXAM: CT ABDOMEN AND PELVIS WITH CONTRAST TECHNIQUE: Multidetector CT imaging of the abdomen and pelvis was performed using the standard protocol following bolus administration of intravenous contrast. CONTRAST:  134mL OMNIPAQUE IOHEXOL 300 MG/ML  SOLN COMPARISON:  Current abdomen  ultrasound.  Prior CT dated 12/17/2017. FINDINGS: Lower chest: No acute abnormality. Hepatobiliary: Diffusely decreased attenuation of the liver. Liver normal in size. No mass or focal lesion. Normal gallbladder. No bile duct dilation. Pancreas: Unremarkable. No pancreatic ductal dilatation or surrounding inflammatory changes. Spleen: Normal in size without focal abnormality. Adrenals/Urinary Tract: Adrenal glands are unremarkable. Kidneys are normal, without renal calculi, focal lesion, or hydronephrosis. Bladder is unremarkable. Stomach/Bowel: Stomach mostly decompressed but otherwise unremarkable. Small bowel and colon are normal in caliber. No wall thickening or inflammation. There scattered colonic diverticula without diverticulitis. Normal appendix visualized. Vascular/Lymphatic: Dense aortic atherosclerotic calcifications. No aneurysm. No enlarged lymph nodes. Reproductive: Uterus and bilateral adnexa are unremarkable. Other: No abdominal wall hernia or abnormality. No abdominopelvic ascites. Musculoskeletal: No fracture or acute finding. No osteoblastic or osteolytic lesions. IMPRESSION: 1. No acute findings. No findings to account for the patient's symptoms. 2. Hepatic steatosis. 3. Colonic diverticula without diverticulitis. 4. Aortic atherosclerosis. Electronically Signed   By: Lajean Manes M.D.   On: 07/19/2018 18:13   Dg Chest Port 1 View  Result Date: 07/19/2018 CLINICAL DATA:  Chest pain.  Fever and vomiting. EXAM: PORTABLE CHEST 1 VIEW COMPARISON:  04/03/2018 FINDINGS: Artifact overlies the chest. There is a poor inspiration. Allowing for the poor inspiration, no active disease is appreciated. No infiltrate, collapse or effusion. No acute bone finding. Chronic degenerative changes of the shoulders. IMPRESSION: Poor inspiration.  No active disease evident allowing for that. Electronically Signed   By: Nelson Chimes M.D.   On: 07/19/2018 13:28   US Abdomen Limited Ruq  Result Date:  07/19/2018 CLINICAL DATA:  Abdominal pain for 1 day. EXAM: ULTRASOUND ABDOMEN LIMITED RIGHT UPPER QUADRANT COMPARISON:  03/16/2018 FINDINGS: Gallbladder: Small polyp versus a nonshadowing stone along the dependent wall of the gallbladder, measuring 6 mm. No other evidence of a stone. No wall thickening or pericholecystic fluid. Common bile duct: Diameter: 6 mm Liver: Increased parenchymal echogenicity with poor through transmission of the sound beam. No mass or focal lesion. Portal vein is patent on color Doppler imaging with normal direction of blood flow towards the liver. IMPRESSION: 1. No acute findings.  No evidence of acute cholecystitis. 2. Single small, 6 mm, gallbladder polyp versus a nonshadowing stone. 3. Extensive hepatic steatosis. Electronically Signed   By: Lajean Manes M.D.   On: 07/19/2018 16:44    Pending Labs Unresulted Labs (From admission, onward)    Start     Ordered   07/19/18 1723  Troponin I - Now Then Q6H  Now then every 6 hours,   R (with STAT occurrences)     07/19/18 1722   07/19/18 1318  Culture, blood (routine x 2)  BLOOD CULTURE X 2,   STAT  07/19/18 1317          Vitals/Pain Today's Vitals   07/19/18 1715 07/19/18 1834 07/19/18 1845 07/19/18 1846  BP: (!) 145/70 (!) 145/61 136/81   Pulse: 75 76 76   Resp: (!) 30 (!) 26 (!) 26   Temp:      TempSrc:      SpO2: 93% 97% 95%   Weight:      Height:      PainSc:    0-No pain    Isolation Precautions Droplet and Contact precautions  Medications Medications  amiodarone (NEXTERONE PREMIX) 360-4.14 MG/200ML-% (1.8 mg/mL) IV infusion (0 mg/hr Intravenous Stopped 07/19/18 1900)    Followed by  amiodarone (NEXTERONE PREMIX) 360-4.14 MG/200ML-% (1.8 mg/mL) IV infusion (30 mg/hr Intravenous New Bag/Given 07/19/18 1900)  acetaminophen (TYLENOL) tablet 1,000 mg (1,000 mg Oral Not Given 07/19/18 1415)  ceFEPIme (MAXIPIME) 2 g in sodium chloride 0.9 % 100 mL IVPB (has no administration in time range)  vancomycin  (VANCOCIN) 1,500 mg in sodium chloride 0.9 % 500 mL IVPB (has no administration in time range)  sodium chloride flush (NS) 0.9 % injection 10-40 mL (10 mLs Intracatheter Given 07/19/18 1604)  sodium chloride flush (NS) 0.9 % injection 10-40 mL (has no administration in time range)  metoprolol tartrate (LOPRESSOR) tablet 25 mg (25 mg Oral Given 07/19/18 1855)  sodium chloride flush (NS) 0.9 % injection 3 mL (3 mLs Intravenous Given During Downtime 07/19/18 1307)  amiodarone (NEXTERONE) 1.8 mg/mL load via infusion 150 mg (150 mg Intravenous Bolus from Bag 07/19/18 1308)  acetaminophen (TYLENOL) tablet 1,000 mg (1,000 mg Oral Given 07/19/18 1354)  ceFEPIme (MAXIPIME) 2 g in sodium chloride 0.9 % 100 mL IVPB (0 g Intravenous Stopped 07/19/18 1620)  metroNIDAZOLE (FLAGYL) IVPB 500 mg (0 mg Intravenous Stopped 07/19/18 1838)  sodium chloride 0.9 % bolus 1,000 mL (0 mLs Intravenous Stopped 07/19/18 1620)  vancomycin (VANCOCIN) 2,000 mg in sodium chloride 0.9 % 500 mL IVPB (0 mg Intravenous Stopped 07/19/18 1621)  magnesium sulfate IVPB 2 g 50 mL (0 g Intravenous Stopped 07/19/18 1838)  iohexol (OMNIPAQUE) 300 MG/ML solution 100 mL (100 mLs Intravenous Contrast Given 07/19/18 1742)    Mobility manual wheelchair Low fall risk   Focused Assessments Cardiac Assessment Handoff:  Cardiac Rhythm: Sinus tachycardia Lab Results  Component Value Date   CKTOTAL 63 09/28/2006   CKMB 1.1 09/28/2006   TROPONINI 0.14 (HH) 07/19/2018   Lab Results  Component Value Date   DDIMER 2.84 (H) 04/23/2011   Does the Patient currently have chest pain? No     R Recommendations: See Admitting Provider Note  Report given to: Rollene Fare RN  Additional Notes:  Pt lives at home by herself. Has a CNA to assist.

## 2018-07-19 NOTE — ED Notes (Signed)
EDP Miller at bedside, pt is having repeated runs of v-tach, Zole is at bedside & pads in place. Pt moaning with discomfort, Amiodarone drip started at 60 mg/hr with a starting 150 mg bolus.

## 2018-07-19 NOTE — Progress Notes (Signed)
[facility] TOC CM -referral 2 ED visits/4 IP admissions in 6 months/HH  Received call from [facility] rep, Glyn Ade RN, states pt is active with [facility]. TOC CM/CSW will continue to follow for dc needs. Jonnie Finner RN CCM Case Mgmt phone [phone]

## 2018-07-19 NOTE — Progress Notes (Signed)
  Pharmacy Antibiotic Note  Heather Petty is a 68 y.o. female admitted on 07/19/2018 with sepsis.  Pharmacy has been consulted for cefepime and vancomycin dosing. WBC 10.7, lactate 2.3, Tmax 102.9.  Last weight 130kg; estimated CrCl >173m/min.   Plan: Vancomycin 20031mx1 loading dose Vancomycin 150089m24hrs Estimated AUC 502 Cefepime 2g x1 Cefepime 2g q8hrs Monitor renal function, LOT, culture data, vanc levels as needed  Temp (24hrs), Avg:101.5 F (38.6 C), Min:100 F (37.8 C), Max:102.9 F (39.4 C)  Recent Labs  Lab 07/19/18 1248  WBC 10.7*    CrCl cannot be calculated (Patient's most recent lab result is older than the maximum 21 days allowed.).    Allergies  Allergen Reactions  . Lasix [Furosemide]     IV-LASIX ==> Reaction: Burning    Antimicrobials this admission: Cefepime 6/12 >> Vanc 6/12 >> Flagyl 6/12 >>  Thank you for allowing pharmacy to be a part of this patient's care.  DylJanae BridgemanharmD PGY1 Pharmacy Resident Phone: ([phone]/2020 1:39 PM

## 2018-07-19 NOTE — ED Provider Notes (Signed)
Patient is an ill-appearing 68 year old female, she does speak both [place] and [place].  She states that starting last night she started to have nausea vomiting and grabs the left side of her upper abdomen and lower chest.  She states that she has been feeling very bad.  She cannot give me any more descriptions, she is moaning in pain, level 5 caveat applies.  On exam the patient has an obese body habitus, she has a soft essentially nontender abdomen, her legs are obese but not particularly edematous.  Her heart is very irregular and is having frequent runs of ventricular tachycardia lasting as long as 15 seconds in some cases.  This is occurring multiple times per minute.  She is perfusing with her regular beats with normal pulses at both the feet and the wrists.  Her lung sounds are clear, the oropharynx is clear and moist.  The patient is having ventricular tachycardia, she will need a stat cardiology consultation.  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  ED pharmacist at the bedside, cardiology paged at 1:07 PM    EKG Interpretation  Date/Time:  Friday July 19 2018 12:56:35 EDT Ventricular Rate:  191 PR Interval:    QRS Duration: 133 QT Interval:  219 QTC Calculation: 294 R Axis:   -79 Text Interpretation:  Incomplete analysis due to missing data in precordial lead(s) Wide-QRS tachycardia Ventricular tachycardia, unsustained Nonspecific IVCD with LAD Missing lead(s): V2 V3 V6 intermittent runs of V tach Confirmed by Noemi Chapel [phone]) on 07/19/2018 1:06:09 PM      .Critical Care Performed by: Noemi Chapel, MD Authorized by: Noemi Chapel, MD   Critical care provider statement:    Critical care time (minutes):  35   Critical care time was exclusive of:  Separately billable procedures and treating other patients and teaching time   Critical care was necessary to treat or prevent imminent or life-threatening deterioration of the following  conditions:  Cardiac failure   Critical care was time spent personally by me on the following activities:  Blood draw for specimens, development of treatment plan with patient or surrogate, discussions with consultants, evaluation of patient's response to treatment, examination of patient, obtaining history from patient or surrogate, ordering and performing treatments and interventions, ordering and review of laboratory studies, ordering and review of radiographic studies, pulse oximetry, re-evaluation of patient's condition and review of old charts   Medical screening examination/treatment/procedure(s) were conducted as a shared visit with non-physician practitioner(s) and myself.  I personally evaluated the patient during the encounter.  Clinical Impression:   Final diagnoses:  Abdominal pain  Sepsis, due to unspecified organism, unspecified whether acute organ dysfunction present [facility])  V-tach ([place])         Noemi Chapel, MD 07/20/18 [phone]

## 2018-07-19 NOTE — Progress Notes (Signed)
Heather Petty, patient's daughter, would like to be contacted with updates @ [phone]

## 2018-07-19 NOTE — Consult Note (Addendum)
Cardiology Consultation:   Patient ID: Heather Petty MRN: [id number]; DOB: Feb 05, 1951  Admit date: 07/19/2018 Date of Consult: 07/19/2018  Primary Care Provider: [name], MD Primary Cardiologist: New  Patient Profile:   Heather Petty is a 68 y.o. female with a hx of with hx of HTN, HLD, DM, CVA, obesity with bedbound for years, using wheelchair, schizophrenia and depression who is being seen today for the evaluation of VT at the request of Dr. Sabra Heck.   No prior cardiac hx. Admitted Feb/March 03/2018 with fever concern for infection of the left arm. Treated with abx. Outpatient surgical evaluation.   History of Present Illness:   Heather Petty presented via EMS from home with 2 days hx of fever, nausea and abdominal pain.  She describes significant palpitation associated with chest discomfort and dizziness but no presyncope or syncope.  No shortness of breath at rest.  She is bedridden and uses wheelchair to move around.  Upon arrival to the ED she was noted to have multiple runs of non-sustained and sustained polymorphic ventricular tachycardias. She Started on amiodarone gtt with a bolus. She continues to have nsVTs they are symptomatic with palpitations but no chest pain or shortness of breath currently.  Lactic acid 2.3. WBC 10.7. Blood glucose 216. Alkaline phosphatase 477. AST 112, ALT 66. CXR with poor quality. Pending COVID test. Started on broad spectrum abx for sepsis.   She states that years ago she was told she had a mild myocardial infarction, however there are no records about it, she does not follow with a cardiologist just to her primary care physician.  Prior to the admission she was taking pravastatin for hyperlipidemia.  Past Medical History:  Diagnosis Date  . AKI (acute kidney injury) ([place]) 12/17/2017  . Arthritis   . Asthma   . Depression   . Diabetes mellitus   . Gout   . High cholesterol   . Hypertension   . Morbid obesity ([place])   . Pneumonia  12/17/2017  . Schizophrenia ([place])   . Stroke [facility])     Past Surgical History:  Procedure Laterality Date  . CYST EXCISION    . DENTAL SURGERY    . TONSILLECTOMY     Inpatient Medications: Scheduled Meds: . acetaminophen  1,000 mg Oral Once   Continuous Infusions: . amiodarone 60 mg/hr (07/19/18 1309)   Followed by  . amiodarone    . ceFEPime (MAXIPIME) IV    . ceFEPime (MAXIPIME) IV    . metronidazole    . sodium chloride    . [START ON 07/20/2018] vancomycin    . vancomycin     PRN Meds:   Allergies:    Allergies  Allergen Reactions  . Lasix [Furosemide]     IV-LASIX ==> Reaction: Burning    Social History:   Social History   Socioeconomic History  . Marital status: Single    Spouse name: Not on file  . Number of children: Not on file  . Years of education: Not on file  . Highest education level: Not on file  Occupational History  . Not on file  Social Needs  . Financial resource strain: Not on file  . Food insecurity    Worry: Not on file    Inability: Not on file  . Transportation needs    Medical: Not on file    Non-medical: Not on file  Tobacco Use  . Smoking status: Former Research scientist (life sciences)  . Smokeless tobacco: Never Used  Substance and Sexual Activity  .  Alcohol use: No  . Drug use: No  . Sexual activity: Not on file  Lifestyle  . Physical activity    Days per week: Not on file    Minutes per session: Not on file  . Stress: Not on file  Relationships  . Social Herbalist on phone: Not on file    Gets together: Not on file    Attends religious service: Not on file    Active member of club or organization: Not on file    Attends meetings of clubs or organizations: Not on file    Relationship status: Not on file  . Intimate partner violence    Fear of current or ex partner: Not on file    Emotionally abused: Not on file    Physically abused: Not on file    Forced sexual activity: Not on file  Other Topics Concern  . Not on file   Social History Narrative  . Not on file    Family History:   Family History  Family history unknown: Yes     ROS:  Please see the history of present illness.  All other ROS reviewed and negative.     Physical Exam/Data:   Vitals:   07/19/18 1240 07/19/18 1254 07/19/18 1303 07/19/18 1314  BP: (!) 129/99 (!) 166/127 (!) 162/74   Pulse: (!) 50 (!) 42 (!) 112   Resp: (!) 22 (!) 37 (!) 24   Temp: 100 F (37.8 C)   (!) 102.9 F (39.4 C)  TempSrc: Oral   Rectal  SpO2: 94% 94% 94%    No intake or output data in the 24 hours ending 07/19/18 1357 Last 3 Weights 03/30/2018 03/18/2018 03/17/2018  Weight (lbs) 286 lb 9.6 oz 286 lb 9.6 oz 266 lb 1.5 oz  Weight (kg) 130 kg 130 kg 120.7 kg     There is no height or weight on file to calculate BMI.  General:  Well nourished, well developed, in no acute distress, obese HEENT: normal Lymph: no adenopathy Neck: no JVD Endocrine:  No thryomegaly Vascular: No carotid bruits; FA pulses 2+ bilaterally without bruits  Cardiac:  normal S1, S2; RRR; no murmur  Lungs:  clear to auscultation bilaterally, no wheezing, rhonchi or rales  Abd: soft, nontender, no hepatomegaly  Ext: no edema, good peripheral pulses bilaterally.  Both feet are very warm. Musculoskeletal:  No deformities, BUE and BLE strength normal and equal Skin: warm and dry  Neuro:  CNs 2-12 intact, no focal abnormalities noted Psych:  Normal affect   EKG:  The EKG was personally reviewed and demonstrates:  SR at rate of 96 bpm, non specific ST changes, poor quality   Relevant CV Studies: none  Laboratory Data:  Chemistry Recent Labs  Lab 07/19/18 1248  NA 137  K 4.5  CL 102  CO2 22  GLUCOSE 246*  BUN 8  CREATININE 0.75  CALCIUM 9.5  GFRNONAA >60  GFRAA >60  ANIONGAP 13    Recent Labs  Lab 07/19/18 1248  PROT 6.9  ALBUMIN 3.7  AST 112*  ALT 66*  ALKPHOS 477*  BILITOT 2.7*   Hematology Recent Labs  Lab 07/19/18 1248  WBC 10.7*  RBC 4.52  HGB 13.9   HCT 43.1  MCV 95.4  MCH 30.8  MCHC 32.3  RDW 13.2  PLT 216   Radiology/Studies:  Dg Chest Port 1 View  Result Date: 07/19/2018 CLINICAL DATA:  Chest pain.  Fever and vomiting. EXAM: PORTABLE  CHEST 1 VIEW COMPARISON:  04/03/2018 FINDINGS: Artifact overlies the chest. There is a poor inspiration. Allowing for the poor inspiration, no active disease is appreciated. No infiltrate, collapse or effusion. No acute bone finding. Chronic degenerative changes of the shoulders. IMPRESSION: Poor inspiration.  No active disease evident allowing for that. Electronically Signed   By: Sareena Odeh Chimes M.D.   On: 07/19/2018 13:28    Assessment and Plan:   1. Sustained and nonsustained polymorphic ventricular tachycardias - K 4.5, magnesium 1.5, she is currently being infused magnesium, I would infuse another bag, -Start metoprolol 25 mg p.o. twice daily  - agree with amiodarone drip with a bolus -This is most probably triggered by underlying infection, we will obtain an echocardiogram.  2. Abdominal Pain/ Elevated transaminitis - Pend CT of abdomen.   3.  Elevated troponin, 0.14, we will trend, we will obtain an echocardiogram, for now no ischemic work-up as this is most probably a result of recurrent ventricular tachycardia's. ECG in between runs of VTs is showing ST, LAFB, poor R wave progression in the precordial leads, early repolarization.   4. Hypertension - add metoprolol 25 mg po BID  5. ? H/O CAD, we will obtain echocardiogram, start metoprolol, hold pravastatin as LFTs are elevated  6. Underlying infection, nausea/vomiting elevated LFTs- abdominal US showed No evidence of acute cholecystitis. Single small, 6 mm, gallbladder polyp versus a nonshadowing stone. She was started on metronidazole.  For questions or updates, please contact [place] Please consult [email or web address] for contact info under   Ena Dawley, MD 07/19/2018 1:57 PM

## 2018-07-20 ENCOUNTER — Inpatient Hospital Stay (HOSPITAL_COMMUNITY): Payer: Medicare Other

## 2018-07-20 DIAGNOSIS — I10 Essential (primary) hypertension: Secondary | ICD-10-CM | POA: Diagnosis not present

## 2018-07-20 DIAGNOSIS — E1129 Type 2 diabetes mellitus with other diabetic kidney complication: Secondary | ICD-10-CM | POA: Diagnosis not present

## 2018-07-20 DIAGNOSIS — I340 Nonrheumatic mitral (valve) insufficiency: Secondary | ICD-10-CM

## 2018-07-20 DIAGNOSIS — I361 Nonrheumatic tricuspid (valve) insufficiency: Secondary | ICD-10-CM

## 2018-07-20 DIAGNOSIS — R9431 Abnormal electrocardiogram [ECG] [EKG]: Secondary | ICD-10-CM | POA: Diagnosis not present

## 2018-07-20 DIAGNOSIS — F200 Paranoid schizophrenia: Secondary | ICD-10-CM | POA: Diagnosis not present

## 2018-07-20 DIAGNOSIS — A419 Sepsis, unspecified organism: Secondary | ICD-10-CM | POA: Diagnosis not present

## 2018-07-20 DIAGNOSIS — E872 Acidosis: Secondary | ICD-10-CM | POA: Diagnosis not present

## 2018-07-20 DIAGNOSIS — S21209D Unspecified open wound of unspecified back wall of thorax without penetration into thoracic cavity, subsequent encounter: Secondary | ICD-10-CM | POA: Diagnosis not present

## 2018-07-20 DIAGNOSIS — I472 Ventricular tachycardia: Secondary | ICD-10-CM | POA: Diagnosis not present

## 2018-07-20 LAB — COMPREHENSIVE METABOLIC PANEL
ALT: 59 U/L — ABNORMAL HIGH (ref 0–44)
AST: 64 U/L — ABNORMAL HIGH (ref 15–41)
Albumin: 2.9 g/dL — ABNORMAL LOW (ref 3.5–5.0)
Alkaline Phosphatase: 323 U/L — ABNORMAL HIGH (ref 38–126)
Anion gap: 9 (ref 5–15)
BUN: 8 mg/dL (ref 8–23)
CO2: 23 mmol/L (ref 22–32)
Calcium: 8.5 mg/dL — ABNORMAL LOW (ref 8.9–10.3)
Chloride: 105 mmol/L (ref 98–111)
Creatinine, Ser: 0.86 mg/dL (ref 0.44–1.00)
GFR calc Af Amer: >60 mL/min (ref >60)
GFR calc non Af Amer: >60 mL/min (ref >60)
Glucose, Bld: 159 mg/dL — ABNORMAL HIGH (ref 70–99)
Potassium: 3.5 mmol/L (ref 3.5–5.1)
Sodium: 137 mmol/L (ref 135–145)
Total Bilirubin: 0.7 mg/dL (ref 0.3–1.2)
Total Protein: 5.6 g/dL — ABNORMAL LOW (ref 6.5–8.1)

## 2018-07-20 LAB — CBC
HCT: 34.9 % — ABNORMAL LOW (ref 36.0–46.0)
Hemoglobin: 11.3 g/dL — ABNORMAL LOW (ref 12.0–15.0)
MCH: 31 pg (ref 26.0–34.0)
MCHC: 32.4 g/dL (ref 30.0–36.0)
MCV: 95.6 fL (ref 80.0–100.0)
Platelets: 144 10*3/uL — ABNORMAL LOW (ref 150–400)
RBC: 3.65 MIL/uL — ABNORMAL LOW (ref 3.87–5.11)
RDW: 13.3 % (ref 11.5–15.5)
WBC: 6.6 10*3/uL (ref 4.0–10.5)
nRBC: 0 % (ref 0.0–0.2)

## 2018-07-20 LAB — GLUCOSE, CAPILLARY
Glucose-Capillary: 206 mg/dL — ABNORMAL HIGH (ref 70–99)
Glucose-Capillary: 116 mg/dL — ABNORMAL HIGH (ref 70–99)
Glucose-Capillary: 116 mg/dL — ABNORMAL HIGH (ref 70–99)
Glucose-Capillary: 230 mg/dL — ABNORMAL HIGH (ref 70–99)
Glucose-Capillary: 134 mg/dL — ABNORMAL HIGH (ref 70–99)
Glucose-Capillary: 145 mg/dL — ABNORMAL HIGH (ref 70–99)

## 2018-07-20 LAB — ECHOCARDIOGRAM COMPLETE
Height: 61 in
Weight: 3608.49 oz

## 2018-07-20 LAB — TROPONIN I
Troponin I: 0.12 ng/mL (ref <0.03)
Troponin I: 0.11 ng/mL (ref <0.03)

## 2018-07-20 LAB — MAGNESIUM: Magnesium: 2.5 mg/dL — ABNORMAL HIGH (ref 1.7–2.4)

## 2018-07-20 MED ORDER — NADOLOL 20 MG PO TABS
20.0000 mg | ORAL_TABLET | Freq: Every day | ORAL | Status: DC
  Administered 2018-07-20 – 2018-07-22 (×3): 20 mg via ORAL
  Filled 2018-07-20 (×3): qty 1

## 2018-07-20 NOTE — Consult Note (Signed)
ELECTROPHYSIOLOGY CONSULT NOTE    Primary Care Physician: [name], MD Referring Physician:  Dr Elizbeth Squires Date: 07/19/2018  Reason for consultation: NSVT, long QT  Heather Petty is a 68 y.o. female with a h/o diabetes, HTN, morbid obesity, and schizophrenia.  She is admitted with fever, nausea, frequent vomiting, and abdominal pain.  She was found to have lactic acidosis.  In the setting of her acute medical illness, she presents with prolonged QT and nonsustained polymorphic VT.  She has been placed on amiodarone with some improvement. She is not very active.  She is wheelchair bound and has chronic issues with infections/ nonhealing wounds.  Today, she denies symptoms of palpitations, chest pain, shortness of breath, orthopnea, PND, lower extremity edema, dizziness, presyncope, syncope, or neurologic sequela. The patient is tolerating medications without difficulties and is otherwise without complaint today.   Past Medical History:  Diagnosis Date  . AKI (acute kidney injury) ([place]) 12/17/2017  . Arthritis   . Asthma   . Depression   . Diabetes mellitus   . Gout   . High cholesterol   . Hypertension   . Morbid obesity ([place])   . Pneumonia 12/17/2017  . Schizophrenia ([place])   . Stroke [facility])    Past Surgical History:  Procedure Laterality Date  . CYST EXCISION    . DENTAL SURGERY    . TONSILLECTOMY      . acetaminophen  1,000 mg Oral Once  . allopurinol  300 mg Oral Daily  . amLODipine  10 mg Oral Daily  . antiseptic oral rinse  15 mL Mouth Rinse TID  . aspirin EC  81 mg Oral Daily  . benazepril  20 mg Oral Daily  . colchicine  0.6 mg Oral BID  . doxepin  25 mg Oral BID  . enoxaparin (LOVENOX) injection  40 mg Subcutaneous Q24H  . famotidine  20 mg Oral QHS  . gabapentin  300 mg Oral TID  . hydrOXYzine  25 mg Oral TID  . insulin aspart  0-15 Units Subcutaneous Q4H  . insulin glargine  4 Units Subcutaneous QHS  . loratadine  10 mg Oral Daily  . metoprolol  tartrate  25 mg Oral BID  . risperiDONE  0.5 mg Oral BID  . silver sulfADIAZINE   Topical BID  . sodium chloride flush  10-40 mL Intracatheter Q12H   . 0.9 % NaCl with KCl 20 mEq / L 125 mL/hr at 07/19/18 2314  . amiodarone 30 mg/hr (07/19/18 2303)  . ceFEPime (MAXIPIME) IV 2 g (07/20/18 0541)  . vancomycin      Allergies  Allergen Reactions  . Lasix [Furosemide] Other (See Comments)    IV-LASIX ==> Reaction: Burning    Social History   Socioeconomic History  . Marital status: Single    Spouse name: Not on file  . Number of children: Not on file  . Years of education: Not on file  . Highest education level: Not on file  Occupational History  . Not on file  Social Needs  . Financial resource strain: Not on file  . Food insecurity    Worry: Not on file    Inability: Not on file  . Transportation needs    Medical: Not on file    Non-medical: Not on file  Tobacco Use  . Smoking status: Former Research scientist (life sciences)  . Smokeless tobacco: Never Used  Substance and Sexual Activity  . Alcohol use: No  . Drug use: No  . Sexual activity: Not  on file  Lifestyle  . Physical activity    Days per week: Not on file    Minutes per session: Not on file  . Stress: Not on file  Relationships  . Social Herbalist on phone: Not on file    Gets together: Not on file    Attends religious service: Not on file    Active member of club or organization: Not on file    Attends meetings of clubs or organizations: Not on file    Relationship status: Not on file  . Intimate partner violence    Fear of current or ex partner: Not on file    Emotionally abused: Not on file    Physically abused: Not on file    Forced sexual activity: Not on file  Other Topics Concern  . Not on file  Social History Narrative  . Not on file    Family History  Family history unknown: Yes    ROS- All systems are reviewed and negative except as per the HPI above  Physical Exam: Telemetry: Vitals:    07/20/18 0035 07/20/18 0253 07/20/18 0602 07/20/18 0912  BP:    (!) 162/68  Pulse:   69 71  Resp:   (!) 21   Temp: 100 F (37.8 C) 99.8 F (37.7 C)    TempSrc: Oral Oral    SpO2:   93%   Weight:   102.3 kg   Height:        GEN- The patient is well appearing, alert and oriented x 3 today.   Head- normocephalic, atraumatic Eyes-  Sclera clear, conjunctiva pink Ears- hearing intact Oropharynx- clear Neck- supple,   Lungs-  normal work of breathing Heart- Regular rate and rhythm  GI- soft, NT, ND, + BS Extremities- no clubbing, cyanosis, or edema MS- no significant deformity or atrophy Skin- no rash or lesion Psych- full affect Neuro- strength and sensation are intact  EKG yesterday reveals sinus rhythm 96 bpm, QT 493 msec Another ekg reveals sinus with prolonged qt and long short coupled nonsustained polymorphic VT  ekg today reveals sinus rhythm 70 bpm, QTc 520 msec.  Labs:   Lab Results  Component Value Date   WBC 6.6 07/20/2018   HGB 11.3 (L) 07/20/2018   HCT 34.9 (L) 07/20/2018   MCV 95.6 07/20/2018   PLT 144 (L) 07/20/2018    Recent Labs  Lab 07/20/18 0400  NA 137  K 3.5  CL 105  CO2 23  BUN 8  CREATININE 0.86  CALCIUM 8.5*  PROT 5.6*  BILITOT 0.7  ALKPHOS 323*  ALT 59*  AST 64*  GLUCOSE 159*   Lab Results  Component Value Date   CKTOTAL 63 09/28/2006   CKMB 1.1 09/28/2006   TROPONINI 0.11 ([place]) 07/20/2018    Lab Results  Component Value Date   CHOL  09/29/2006    148        ATP III CLASSIFICATION:  <200     mg/dL   Desirable  200-239  mg/dL   Borderline High  >=240    mg/dL   High   Lab Results  Component Value Date   HDL 36 (L) 09/29/2006   Lab Results  Component Value Date   [facility]  09/29/2006    67        Total Cholesterol/HDL:CHD Risk Coronary Heart Disease Risk Table  Men   Women  1/2 Average Risk   3.4   3.3   Lab Results  Component Value Date   TRIG 223 (H) 09/29/2006   Lab Results  Component  Value Date   CHOLHDL 4.1 09/29/2006   No results found for: LDLDIRECT     Echo: pending  ASSESSMENT AND PLAN:   1. Long QT with nonsustained polymorphic VT/ torsades Occurred in the setting of acute medical illness Hopefully will resolve as she clinically improves I worry about long qt and antipsychotic medicines. Avoid any QT prolonging drugs if able. Keep K >3.9 and Mg >1.9 Stop amiodarone which also prolongs qt Add nadolol 27m daily and titrate as able over the weekend (preferred for long qt over metoprolol) Awaiting echo, also consider ischemic workup depending on echo findings/ clinical course Given wounds, infection, schizophrenia, she is not an ICD candidate long term  2. HTN Switch metoprolol to nadolol  3. Elevated troponin Likely due to acute medical illness Consider ischemic workup once clinically improved, depending on echo  4. Underlying infection, N/V, elevated LFTs, lactic acidosis, etc Per primary team   JThompson Grayer MD 07/20/2018  10:33 AM

## 2018-07-20 NOTE — Plan of Care (Signed)
  Problem: Education: Goal: Knowledge of General Education information will improve Description: Including pain rating scale, medication(s)/side effects and non-pharmacologic comfort measures Outcome: Progressing   Problem: Health Behavior/Discharge Planning: Goal: Ability to manage health-related needs will improve Outcome: Progressing   Problem: Clinical Measurements: Goal: Ability to maintain clinical measurements within normal limits will improve Outcome: Progressing Goal: Will remain free from infection Outcome: Progressing Goal: Diagnostic test results will improve Outcome: Progressing Goal: Cardiovascular complication will be avoided Outcome: Progressing   Problem: Nutrition: Goal: Adequate nutrition will be maintained Outcome: Progressing   Problem: Coping: Goal: Level of anxiety will decrease Outcome: Progressing   Problem: Elimination: Goal: Will not experience complications related to bowel motility Outcome: Progressing Goal: Will not experience complications related to urinary retention Outcome: Progressing   Problem: Pain Managment: Goal: General experience of comfort will improve Outcome: Progressing   Problem: Safety: Goal: Ability to remain free from injury will improve Outcome: Progressing   Problem: Skin Integrity: Goal: Risk for impaired skin integrity will decrease Outcome: Progressing   Problem: Activity: Goal: Risk for activity intolerance will decrease Outcome: Not Met (add Reason)   Problem: Clinical Measurements: Goal: Respiratory complications will improve Outcome: Not Applicable

## 2018-07-20 NOTE — Progress Notes (Signed)
  Echocardiogram 2D Echocardiogram has been performed.  Heather Petty 07/20/2018, 12:13 PM

## 2018-07-20 NOTE — Progress Notes (Signed)
  Amiodarone Drug - Drug Interaction Consult Note Amiodarone is metabolized by the cytochrome P450 system and therefore has the potential to cause many drug interactions. Amiodarone has an average plasma half-life of 50 days (range 20 to 100 days).   There is potential for drug interactions to occur several weeks or months after stopping treatment and the onset of drug interactions may be slow after initiating amiodarone.   []  Statins: Increased risk of myopathy. Simvastatin- restrict dose to 20mg  daily. Other statins: counsel patients to report any muscle pain or weakness immediately.  []  Anticoagulants: Amiodarone can increase anticoagulant effect. Consider warfarin dose reduction. Patients should be monitored closely and the dose of anticoagulant altered accordingly, remembering that amiodarone levels take several weeks to stabilize.  []  Antiepileptics: Amiodarone can increase plasma concentration of phenytoin, the dose should be reduced. Note that small changes in phenytoin dose can result in large changes in levels. Monitor patient and counsel on signs of toxicity.  [x]  Beta blockers: increased risk of bradycardia, AV block and myocardial depression. Sotalol - avoid concomitant use.  []   Calcium channel blockers (diltiazem and verapamil): increased risk of bradycardia, AV block and myocardial depression.  []   Cyclosporine: Amiodarone increases levels of cyclosporine. Reduced dose of cyclosporine is recommended.  []  Digoxin dose should be halved when amiodarone is started.  []  Diuretics: increased risk of cardiotoxicity if hypokalemia occurs.  []  Oral hypoglycemic agents (glyburide, glipizide, glimepiride): increased risk of hypoglycemia. Patient's glucose levels should be monitored closely when initiating amiodarone therapy.   [x]  Drugs that prolong the QT interval:  Torsades de pointes risk may be increased with concurrent use - avoid if possible.  Monitor QTc, also keep  magnesium/potassium WNL if concurrent therapy can't be avoided. [place] Kitchen Antibiotics: e.g. fluoroquinolones, erythromycin. . Antiarrhythmics: e.g. quinidine, procainamide, disopyramide, sotalol. . Antipsychotics: e.g. phenothiazines, haloperidol.  . Lithium, tricyclic antidepressants, and methadone.  Recommendation: Patient on 3 QTc prolonging medications: risperidone, doxepin and amiodarone. Monitor QTc with therapy.  Thank you for involving pharmacy in this patient's care.  Janae Bridgeman, PharmD PGY1 Pharmacy Resident Phone: [phone] 07/20/2018 7:47 AM

## 2018-07-20 NOTE — Progress Notes (Signed)
PT Cancellation Note  Patient Details Name: Heather Petty MRN: [id number] DOB: 1950/04/16   Cancelled Treatment:    Reason Eval/Treat Not Completed: Other (comment).  Pt is refusing therapy today after coming back 3x to get her.  Will re-attempt tomorrow, and notified nursing about her refusal.   Ramond Dial 07/20/2018, 11:33 AM   Mee Hives, PT MS Acute Rehab Dept. Number: [place]

## 2018-07-20 NOTE — Progress Notes (Signed)
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  She developed nausea and vomiting.  Also complained of abdominal pain. CT abdomen pelvis done in the ED shows no acute abnormality.Shows hepatic steatosis. Patient also had nonsustained V. tach with elevated troponin, seen by cardiology in the ED, initially started on IV amiodarone.    Subjective   This morning patient is hungry and wants to try food.  Denies nausea or vomiting.   Assessment/Plan:     1. Fever--resolved, unclear etiology.  UA is clear, chest x-ray shows no acute disease.  Patient was empirically started on vancomycin and cefepime.WBC is down to 6.6.  Lactic acid was 1.4 on admission.  Will discontinue IV antibiotics at this time, as I do not suspect sepsis.  2. Nausea and vomiting/transaminitis-CT scan of the abdomen shows hepatic steatosis, this is likely the cause of nausea and vomiting.  LFTs are decreasing.  There is no history of alcohol use.  Started on clear liquid diet today.  3. Sacral wound-wound looks clean, wound care has been consulted for wound care.  Will follow recommendations.  4. Nonsustained V. [place] cardiology assistance, patient started on nadolol.  Amiodarone has been discontinued.  Plan to keep magnesium more than 1.9 and potassium more than 3.9.  5. Diabetes mellitus type 2-glucose is controlled, continue Lantus 4 units subcu daily at bedtime, sliding scale insulin NovoLog.  6. Hypertension-blood pressure stable continue amlodipine, benazepril.  Metoprolol discontinued and nadolol started per  cardiology.  7. Schizophrenia-continue risperidone, hydroxyzine, doxepin  8. Gout-continue allopurinol, colchicine.     CBG: Recent Labs  Lab 07/19/18 2233 07/20/18 0119 07/20/18 0511 07/20/18 0858 07/20/18 1435  GLUCAP 217* 206* 116* 116* 230*    CBC: Recent Labs  Lab 07/19/18 1248 07/20/18 0400  WBC 10.7* 6.6  NEUTROABS 8.9*  --   HGB 13.9 11.3*  HCT 43.1 34.9*  MCV 95.4 95.6  PLT 216 144*    Basic Metabolic Panel: Recent Labs  Lab 07/19/18 1248 07/19/18 1400 07/20/18 0400  NA 137  --  137  K 4.5  --  3.5  CL 102  --  105  CO2 22  --  23  GLUCOSE 246*  --  159*  BUN 8  --  8  CREATININE 0.75  --  0.86  CALCIUM 9.5  --  8.5*  MG  --  1.5* 2.5*     DVT prophylaxis: Lovenox  Code Status: Full code  Family Communication:   Disposition Plan: likely home when medically ready for discharge in next 2 to 3 days.     Consultants:  Cardiology  Procedures:     Antibiotics:   Anti-infectives (From admission, onward)   Start     Dose/Rate Route Frequency Ordered Stop   07/20/18 1400  vancomycin (VANCOCIN) 1,500 mg in sodium chloride 0.9 % 500 mL IVPB     1,500 mg 250 mL/hr over 120 Minutes Intravenous Every 24 hours 07/19/18 1349     07/19/18 2300  ceFEPIme (MAXIPIME) 2 g in sodium chloride 0.9 % 100 mL IVPB     2 g 200 mL/hr over 30 Minutes Intravenous Every 8 hours 07/19/18 1349  07/19/18 1330  ceFEPIme (MAXIPIME) 2 g in sodium chloride 0.9 % 100 mL IVPB     2 g 200 mL/hr over 30 Minutes Intravenous  Once 07/19/18 1318 07/19/18 1620   07/19/18 1330  metroNIDAZOLE (FLAGYL) IVPB 500 mg     500 mg 100 mL/hr over 60 Minutes Intravenous  Once 07/19/18 1318 07/19/18 1838   07/19/18 1330  vancomycin (VANCOCIN) IVPB 1000 mg/200 mL premix  Status:  Discontinued     1,000 mg 200 mL/hr over 60 Minutes Intravenous  Once 07/19/18 1318 07/19/18 1320   07/19/18 1330  vancomycin (VANCOCIN) 2,000 mg in sodium chloride 0.9 % 500 mL IVPB     2,000  mg 250 mL/hr over 120 Minutes Intravenous  Once 07/19/18 1320 07/19/18 1621       Objective   Vitals:   07/20/18 0035 07/20/18 0253 07/20/18 0602 07/20/18 0912  BP:    (!) 162/68  Pulse:   69 71  Resp:   (!) 21   Temp: 100 F (37.8 C) 99.8 F (37.7 C)    TempSrc: Oral Oral    SpO2:   93%   Weight:   102.3 kg   Height:        Intake/Output Summary (Last 24 hours) at 07/20/2018 1508 Last data filed at 07/20/2018 1103 Gross per 24 hour  Intake 1085.03 ml  Output 600 ml  Net 485.03 ml   Filed Weights   07/19/18 1430 07/20/18 0602  Weight: 101.6 kg 102.3 kg     Physical Examination:   General-appears in no acute distress Heart-S1-S2, regular, no murmur auscultated Lungs-clear to auscultation bilaterally, no wheezing or crackles auscultated Abdomen-soft, nontender, no organomegaly Extremities-no edema in the lower extremities Neuro-alert, oriented x3, no focal deficit noted    Data Reviewed: I have personally reviewed following labs and imaging studies   Recent Results (from the past 240 hour(s))  SARS Coronavirus 2     Status: None   Collection Time: 07/19/18  1:09 PM  Result Value Ref Range Status   SARS Coronavirus 2 NOT DETECTED NOT DETECTED Final    Comment: (NOTE) SARS-CoV-2 target nucleic acids are NOT DETECTED. The SARS-CoV-2 RNA is generally detectable in upper and lower respiratory specimens during the acute phase of infection.  Negative  results do not preclude SARS-CoV-2 infection, do not rule out co-infections with other pathogens, and should not be used as the sole basis for treatment or other patient management decisions.  Negative results must be combined with clinical observations, patient history, and epidemiological information. The expected result is Not Detected. Fact Sheet for Patients: http://www.biofiredefense.com/wp-content/uploads/2020/03/BIOFIRE-COVID -19-patients.pdf Fact Sheet for Healthcare  Providers: http://www.biofiredefense.com/wp-content/uploads/2020/03/BIOFIRE-COVID -19-hcp.pdf This test is not yet approved or cleared by the [place] and  has been authorized for detection and/or diagnosis of SARS-CoV-2 by FDA under an Emergency Use Authorization (EUA).  This EUA will remain in effec t (meaning this test can be used) for the duration of  the COVID-19 declaration under Section 564(b)(1) of the Act, 21 U.S.C. section 360bbb-3(b)(1), unless the authorization is terminated or revoked sooner. Performed at [facility], [place] [address], [place], [place] [address]   Culture, blood (routine x 2)     Status: None (Preliminary result)   Collection Time: 07/19/18  3:30 PM   Specimen: BLOOD  Result Value Ref Range Status   Specimen Description BLOOD BLOOD LEFT HAND  Final   Special Requests   Final    BOTTLES DRAWN AEROBIC AND ANAEROBIC Blood Culture  adequate volume   Culture   Final    NO GROWTH < 24 HOURS Performed at [facility], [place] [address], [place], [place] [address]    Report Status PENDING  Incomplete  Culture, blood (routine x 2)     Status: None (Preliminary result)   Collection Time: 07/19/18  3:30 PM   Specimen: BLOOD  Result Value Ref Range Status   Specimen Description BLOOD PICC LINE  Final   Special Requests   Final    BOTTLES DRAWN AEROBIC AND ANAEROBIC Blood Culture adequate volume   Culture   Final    NO GROWTH < 24 HOURS Performed at [facility], [place] [address], [place],  [address]    Report Status PENDING  Incomplete     Liver Function Tests: Recent Labs  Lab 07/19/18 1248 07/20/18 0400  AST 112* 64*  ALT 66* 59*  ALKPHOS 477* 323*  BILITOT 2.7* 0.7  PROT 6.9 5.6*  ALBUMIN 3.7 2.9*   Recent Labs  Lab 07/19/18 1248  LIPASE 22   No results for input(s): AMMONIA in the last 168 hours.  Cardiac Enzymes: Recent Labs  Lab 07/19/18 1530 07/19/18 1729 07/19/18 2326 07/20/18 0400  TROPONINI 0.14*  0.14* 0.12* 0.11*   BNP (last 3 results) Recent Labs    03/03/18 0927 03/14/18 2011  BNP 174.8* 48.9    ProBNP (last 3 results) No results for input(s): PROBNP in the last 8760 hours.    Studies: Ct Abdomen Pelvis W Contrast  Result Date: 07/19/2018 CLINICAL DATA:  Fever with nausea and vomiting.  Mid abdominal pain. EXAM: CT ABDOMEN AND PELVIS WITH CONTRAST TECHNIQUE: Multidetector CT imaging of the abdomen and pelvis was performed using the standard protocol following bolus administration of intravenous contrast. CONTRAST:  123m OMNIPAQUE IOHEXOL 300 MG/ML  SOLN COMPARISON:  Current abdomen ultrasound.  Prior CT dated 12/17/2017. FINDINGS: Lower chest: No acute abnormality. Hepatobiliary: Diffusely decreased attenuation of the liver. Liver normal in size. No mass or focal lesion. Normal gallbladder. No bile duct dilation. Pancreas: Unremarkable. No pancreatic ductal dilatation or surrounding inflammatory changes. Spleen: Normal in size without focal abnormality. Adrenals/Urinary Tract: Adrenal glands are unremarkable. Kidneys are normal, without renal calculi, focal lesion, or hydronephrosis. Bladder is unremarkable. Stomach/Bowel: Stomach mostly decompressed but otherwise unremarkable. Small bowel and colon are normal in caliber. No wall thickening or inflammation. There scattered colonic diverticula without diverticulitis. Normal appendix visualized. Vascular/Lymphatic: Dense aortic atherosclerotic calcifications. No aneurysm. No enlarged lymph nodes. Reproductive: Uterus and bilateral adnexa are unremarkable. Other: No abdominal wall hernia or abnormality. No abdominopelvic ascites. Musculoskeletal: No fracture or acute finding. No osteoblastic or osteolytic lesions. IMPRESSION: 1. No acute findings. No findings to account for the patient's symptoms. 2. Hepatic steatosis. 3. Colonic diverticula without diverticulitis. 4. Aortic atherosclerosis. Electronically Signed   By: DLajean ManesM.D.    On: 07/19/2018 18:13   Dg Chest Port 1 View  Result Date: 07/19/2018 CLINICAL DATA:  Chest pain.  Fever and vomiting. EXAM: PORTABLE CHEST 1 VIEW COMPARISON:  04/03/2018 FINDINGS: Artifact overlies the chest. There is a poor inspiration. Allowing for the poor inspiration, no active disease is appreciated. No infiltrate, collapse or effusion. No acute bone finding. Chronic degenerative changes of the shoulders. IMPRESSION: Poor inspiration.  No active disease evident allowing for that. Electronically Signed   By: MNelson ChimesM.D.   On: 07/19/2018 13:28   UKoreaAbdomen Limited Ruq  Result Date: 07/19/2018 CLINICAL DATA:  Abdominal pain for 1  day. EXAM: ULTRASOUND ABDOMEN LIMITED RIGHT UPPER QUADRANT COMPARISON:  03/16/2018 FINDINGS: Gallbladder: Small polyp versus a nonshadowing stone along the dependent wall of the gallbladder, measuring 6 mm. No other evidence of a stone. No wall thickening or pericholecystic fluid. Common bile duct: Diameter: 6 mm Liver: Increased parenchymal echogenicity with poor through transmission of the sound beam. No mass or focal lesion. Portal vein is patent on color Doppler imaging with normal direction of blood flow towards the liver. IMPRESSION: 1. No acute findings.  No evidence of acute cholecystitis. 2. Single small, 6 mm, gallbladder polyp versus a nonshadowing stone. 3. Extensive hepatic steatosis. Electronically Signed   By: Lajean Manes M.D.   On: 07/19/2018 16:44    Scheduled Meds: . acetaminophen  1,000 mg Oral Once  . allopurinol  300 mg Oral Daily  . amLODipine  10 mg Oral Daily  . antiseptic oral rinse  15 mL Mouth Rinse TID  . aspirin EC  81 mg Oral Daily  . benazepril  20 mg Oral Daily  . colchicine  0.6 mg Oral BID  . doxepin  25 mg Oral BID  . enoxaparin (LOVENOX) injection  40 mg Subcutaneous Q24H  . famotidine  20 mg Oral QHS  . gabapentin  300 mg Oral TID  . hydrOXYzine  25 mg Oral TID  . insulin aspart  0-15 Units Subcutaneous Q4H  . insulin  glargine  4 Units Subcutaneous QHS  . loratadine  10 mg Oral Daily  . nadolol  20 mg Oral Daily  . risperiDONE  0.5 mg Oral BID  . silver sulfADIAZINE   Topical BID  . sodium chloride flush  10-40 mL Intracatheter Q12H    Admission status: Inpatient: Based on patients clinical presentation and evaluation of above clinical data, I have made determination that patient meets Inpatient criteria at this time.  Time spent: 20 min  [facility] Pager [phone]. If 7PM-7AM, please contact night-coverage at [email or web address], Office  [phone]  password [email or web address]  07/20/2018, 3:08 PM  LOS: 1 day

## 2018-07-21 DIAGNOSIS — A419 Sepsis, unspecified organism: Secondary | ICD-10-CM | POA: Diagnosis not present

## 2018-07-21 DIAGNOSIS — F200 Paranoid schizophrenia: Secondary | ICD-10-CM | POA: Diagnosis not present

## 2018-07-21 DIAGNOSIS — I10 Essential (primary) hypertension: Secondary | ICD-10-CM | POA: Diagnosis not present

## 2018-07-21 DIAGNOSIS — R9431 Abnormal electrocardiogram [ECG] [EKG]: Secondary | ICD-10-CM | POA: Diagnosis not present

## 2018-07-21 DIAGNOSIS — S21209D Unspecified open wound of unspecified back wall of thorax without penetration into thoracic cavity, subsequent encounter: Secondary | ICD-10-CM | POA: Diagnosis not present

## 2018-07-21 DIAGNOSIS — I472 Ventricular tachycardia: Secondary | ICD-10-CM | POA: Diagnosis not present

## 2018-07-21 DIAGNOSIS — E872 Acidosis: Secondary | ICD-10-CM | POA: Diagnosis not present

## 2018-07-21 DIAGNOSIS — K7581 Nonalcoholic steatohepatitis (NASH): Secondary | ICD-10-CM

## 2018-07-21 DIAGNOSIS — E1129 Type 2 diabetes mellitus with other diabetic kidney complication: Secondary | ICD-10-CM | POA: Diagnosis not present

## 2018-07-21 LAB — CBC
HCT: 33.5 % — ABNORMAL LOW (ref 36.0–46.0)
Hemoglobin: 10.4 g/dL — ABNORMAL LOW (ref 12.0–15.0)
MCH: 31.1 pg (ref 26.0–34.0)
MCHC: 31 g/dL (ref 30.0–36.0)
MCV: 100.3 fL — ABNORMAL HIGH (ref 80.0–100.0)
Platelets: 128 10*3/uL — ABNORMAL LOW (ref 150–400)
RBC: 3.34 MIL/uL — ABNORMAL LOW (ref 3.87–5.11)
RDW: 13.8 % (ref 11.5–15.5)
WBC: 5.3 10*3/uL (ref 4.0–10.5)
nRBC: 0 % (ref 0.0–0.2)

## 2018-07-21 LAB — COMPREHENSIVE METABOLIC PANEL
ALT: 60 U/L — ABNORMAL HIGH (ref 0–44)
AST: 69 U/L — ABNORMAL HIGH (ref 15–41)
Albumin: 2.4 g/dL — ABNORMAL LOW (ref 3.5–5.0)
Alkaline Phosphatase: 293 U/L — ABNORMAL HIGH (ref 38–126)
Anion gap: 7 (ref 5–15)
BUN: 12 mg/dL (ref 8–23)
CO2: 22 mmol/L (ref 22–32)
Calcium: 8.1 mg/dL — ABNORMAL LOW (ref 8.9–10.3)
Chloride: 110 mmol/L (ref 98–111)
Creatinine, Ser: 0.9 mg/dL (ref 0.44–1.00)
GFR calc Af Amer: >60 mL/min (ref >60)
GFR calc non Af Amer: >60 mL/min (ref >60)
Glucose, Bld: 106 mg/dL — ABNORMAL HIGH (ref 70–99)
Potassium: 3.6 mmol/L (ref 3.5–5.1)
Sodium: 139 mmol/L (ref 135–145)
Total Bilirubin: 0.6 mg/dL (ref 0.3–1.2)
Total Protein: 4.8 g/dL — ABNORMAL LOW (ref 6.5–8.1)

## 2018-07-21 LAB — GLUCOSE, CAPILLARY
Glucose-Capillary: 129 mg/dL — ABNORMAL HIGH (ref 70–99)
Glucose-Capillary: 124 mg/dL — ABNORMAL HIGH (ref 70–99)
Glucose-Capillary: 89 mg/dL (ref 70–99)
Glucose-Capillary: 178 mg/dL — ABNORMAL HIGH (ref 70–99)
Glucose-Capillary: 192 mg/dL — ABNORMAL HIGH (ref 70–99)
Glucose-Capillary: 164 mg/dL — ABNORMAL HIGH (ref 70–99)

## 2018-07-21 MED ORDER — POTASSIUM CHLORIDE CRYS ER 20 MEQ PO TBCR
20.0000 meq | EXTENDED_RELEASE_TABLET | Freq: Once | ORAL | Status: AC
  Administered 2018-07-21: 16:00:00 20 meq via ORAL
  Filled 2018-07-21: qty 1

## 2018-07-21 NOTE — Plan of Care (Signed)
Probable discharge to home on Monday.  Problem: Education: Goal: Knowledge of General Education information will improve Description: Including pain rating scale, medication(s)/side effects and non-pharmacologic comfort measures Outcome: Progressing   Problem: Health Behavior/Discharge Planning: Goal: Ability to manage health-related needs will improve Outcome: Progressing   Problem: Clinical Measurements: Goal: Ability to maintain clinical measurements within normal limits will improve Outcome: Progressing Goal: Will remain free from infection Outcome: Progressing Goal: Diagnostic test results will improve Outcome: Progressing Goal: Cardiovascular complication will be avoided Outcome: Progressing   Problem: Activity: Goal: Risk for activity intolerance will decrease Outcome: Progressing   Problem: Nutrition: Goal: Adequate nutrition will be maintained Outcome: Progressing   Problem: Coping: Goal: Level of anxiety will decrease Outcome: Progressing   Problem: Elimination: Goal: Will not experience complications related to bowel motility Outcome: Progressing Goal: Will not experience complications related to urinary retention Outcome: Progressing   Problem: Pain Managment: Goal: General experience of comfort will improve Outcome: Progressing   Problem: Safety: Goal: Ability to remain free from injury will improve Outcome: Progressing   Problem: Skin Integrity: Goal: Risk for impaired skin integrity will decrease Outcome: Progressing

## 2018-07-21 NOTE — Progress Notes (Signed)
Progress Note   Subjective   Doing well today, the patient denies CP or SOB.  No new concerns  Inpatient Medications    Scheduled Meds: . acetaminophen  1,000 mg Oral Once  . allopurinol  300 mg Oral Daily  . amLODipine  10 mg Oral Daily  . antiseptic oral rinse  15 mL Mouth Rinse TID  . aspirin EC  81 mg Oral Daily  . benazepril  20 mg Oral Daily  . colchicine  0.6 mg Oral BID  . doxepin  25 mg Oral BID  . enoxaparin (LOVENOX) injection  40 mg Subcutaneous Q24H  . famotidine  20 mg Oral QHS  . gabapentin  300 mg Oral TID  . hydrOXYzine  25 mg Oral TID  . insulin aspart  0-15 Units Subcutaneous Q4H  . insulin glargine  4 Units Subcutaneous QHS  . loratadine  10 mg Oral Daily  . nadolol  20 mg Oral Daily  . risperiDONE  0.5 mg Oral BID  . silver sulfADIAZINE   Topical BID  . sodium chloride flush  10-40 mL Intracatheter Q12H   Continuous Infusions: . 0.9 % NaCl with KCl 20 mEq / L 125 mL/hr at 07/21/18 0850   PRN Meds: acetaminophen, albuterol, [place] Soothing Bath Treatment, azelastine, fluticasone, ipratropium-albuterol, nystatin, ondansetron (ZOFRAN) IV, polyethylene glycol, sodium chloride flush, traMADol   Vital Signs    Vitals:   07/21/18 0046 07/21/18 0505 07/21/18 0537 07/21/18 0800  BP: (!) 146/62 (!) 120/56    Pulse: (!) 55 (!) 47    Resp: (!) 22 19    Temp: 98.2 F (36.8 C) 98 F (36.7 C)  97.8 F (36.6 C)  TempSrc: Oral Oral    SpO2: 100% 100%    Weight:   106.3 kg   Height:        Intake/Output Summary (Last 24 hours) at 07/21/2018 0942 Last data filed at 07/21/2018 6629 Gross per 24 hour  Intake 4274.23 ml  Output -  Net 4274.23 ml   Filed Weights   07/19/18 1430 07/20/18 0602 07/21/18 0537  Weight: 101.6 kg 102.3 kg 106.3 kg    Telemetry    Sinus bradycardia, no further arrhythmias - Personally Reviewed  Physical Exam   GEN- The patient is well appearing, alert  Head- normocephalic, atraumatic Eyes-  Sclera clear, conjunctiva  pink Ears- hearing intact Oropharynx- clear Neck- supple, Lungs-  normal work of breathing Heart- Regular rate and rhythm  GI- soft, NT, ND, + BS Extremities- no clubbing, cyanosis, or edema      Labs    Chemistry Recent Labs  Lab 07/19/18 1248 07/20/18 0400 07/21/18 0407  NA 137 137 139  K 4.5 3.5 3.6  CL 102 105 110  CO2 22 23 22   GLUCOSE 246* 159* 106*  BUN 8 8 12   CREATININE 0.75 0.86 0.90  CALCIUM 9.5 8.5* 8.1*  PROT 6.9 5.6* 4.8*  ALBUMIN 3.7 2.9* 2.4*  AST 112* 64* 69*  ALT 66* 59* 60*  ALKPHOS 477* 323* 293*  BILITOT 2.7* 0.7 0.6  GFRNONAA >60 >60 >60  GFRAA >60 >60 >60  ANIONGAP 13 9 7      Hematology Recent Labs  Lab 07/19/18 1248 07/20/18 0400 07/21/18 0407  WBC 10.7* 6.6 5.3  RBC 4.52 3.65* 3.34*  HGB 13.9 11.3* 10.4*  HCT 43.1 34.9* 33.5*  MCV 95.4 95.6 100.3*  MCH 30.8 31.0 31.1  MCHC 32.3 32.4 31.0  RDW 13.2 13.3 13.8  PLT 216 144* 128*  Cardiac Enzymes Recent Labs  Lab 07/19/18 1530 07/19/18 1729 07/19/18 2326 07/20/18 0400  TROPONINI 0.14* 0.14* 0.12* 0.11*   No results for input(s): TROPIPOC in the last 168 hours.      Assessment & Plan    1.  Long QT with nonsustained polymorphic VT Resolved with correction of Mg, avoiding QT prolonging medicines and nadolol  Keep K >3.9, Mg >1.9 Continue nadolol 48m daily  Not an ICD candidate   2. HTN Stable No change required today  3. Elevated troponin No WMA on echo (Reviewed) Likely due to demand ischemia Would consider outpatient myoview once clinically improved  4. Underlying infection/ N/V/ elevated LFTs/lactic acidosis Per primary team   JThompson GrayerMD, FGlencoe Regional Health Srvcs6/14/2020 9:42 AM

## 2018-07-21 NOTE — Progress Notes (Signed)
PT Cancellation Note  Patient Details Name: Heather Petty MRN: [id number] DOB: 03/22/1950   Cancelled Treatment:    Reason Eval/Treat Not Completed: Other (comment)   Attempted PT eval, however pt declined, stating she is too sore;   In our conversation, I was able to get a bit more information about her baseline function; Her CNA comes daily in the morning, and assists with morning ADLs (dressing, breakfast prep, bathing every other day); She uses a transfer bench to get in the tub/shower for showering with CNA help; She tell sme when she feels normal, she does not need assistance to get into her power chair ("my CNA watches me"), and then she manages the rest of the day modified independently at power chair level;   Next attempt at PT eval, we could use more information about her ability to toilet and manage her hygeine needs when she doesn't have assist;   Noteworthy that she has been to SNF for rehab before, and she doesn't "want to do that again";   Will reattempt -- worth documenting our Acute Rehab Dept guidelines that 3 refusals in a row results in discharging Acute PT services.  [name], [place]  Acute Rehabilitation Services Pager [phone] Office [phone]      [place]ta [place] 07/21/2018, 8:08 AM

## 2018-07-21 NOTE — Progress Notes (Signed)
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  She developed nausea and vomiting.  Also complained of abdominal pain. CT abdomen pelvis done in the ED shows no acute abnormality.Shows hepatic steatosis. Patient also had nonsustained V. tach with elevated troponin, seen by cardiology in the ED, initially started on IV amiodarone.    Subjective   Patient seen and examined, denies nausea vomiting.  Liver enzymes are back to normal.   Assessment/Plan:     1. Fever--resolved, unclear etiology.  UA is clear, chest x-ray shows no acute disease.  Patient was empirically started on vancomycin and cefepime.WBC is down to 6.6.  Lactic acid was 1.4 on admission.  IV antibiotics were discontinued yesterday, did not suspect sepsis.  2. Nausea and vomiting/transaminitis-CT scan of the abdomen shows hepatic steatosis, this is likely the cause of nausea and vomiting.  LFTs are back to baseline..  There is no history of alcohol use.  Started on clear liquid diet yesterday, will advance diet to soft diet..  Patient has been very noncompliant with healthy diet as per patient's daughter.  Patient has similar presentation in November at that time hepatitis panel was only positive for hepatitis A IgM.  3. Sacral wound-wound looks clean, wound care has been consulted for wound care. Will follow recommendations.  4. Nonsustained V. [place] cardiology assistance, patient started on nadolol.  Amiodarone has been discontinued.  Plan to keep magnesium more than 1.9 and potassium more than 3.9.  5. Diabetes mellitus type  2-glucose is controlled, continue Lantus 4 units subcu daily at bedtime, sliding scale insulin NovoLog.  6. Hypertension-blood pressure stable continue amlodipine, benazepril.  Metoprolol discontinued and nadolol started per cardiology.  7. Schizophrenia-continue risperidone, hydroxyzine, doxepin  8. Gout-continue allopurinol, colchicine.     CBG: Recent Labs  Lab 07/20/18 1843 07/20/18 2129 07/21/18 0109 07/21/18 0507 07/21/18 0831  GLUCAP 134* 145* 129* 124* 89    CBC: Recent Labs  Lab 07/19/18 1248 07/20/18 0400 07/21/18 0407  WBC 10.7* 6.6 5.3  NEUTROABS 8.9*  --   --   HGB 13.9 11.3* 10.4*  HCT 43.1 34.9* 33.5*  MCV 95.4 95.6 100.3*  PLT 216 144* 128*    Basic Metabolic Panel: Recent Labs  Lab 07/19/18 1248 07/19/18 1400 07/20/18 0400 07/21/18 0407  NA 137  --  137 139  K 4.5  --  3.5 3.6  CL 102  --  105 110  CO2 22  --  23 22  GLUCOSE 246*  --  159* 106*  BUN 8  --  8 12  CREATININE 0.75  --  0.86 0.90  CALCIUM 9.5  --  8.5* 8.1*  MG  --  1.5* 2.5*  --      DVT prophylaxis: Lovenox  Code Status: Full code  Family Communication:   Disposition Plan: likely home when medically ready for discharge in next 2 to 3 days.     Consultants:  Cardiology  Procedures:     Antibiotics:   Anti-infectives (From admission, onward)   Start     Dose/Rate Route Frequency Ordered Stop   07/20/18 1400  vancomycin (VANCOCIN) 1,500 mg in sodium chloride 0.9 %  500 mL IVPB  Status:  Discontinued     1,500 mg 250 mL/hr over 120 Minutes Intravenous Every 24 hours 07/19/18 1349 07/20/18 1615   07/19/18 2300  ceFEPIme (MAXIPIME) 2 g in sodium chloride 0.9 % 100 mL IVPB  Status:  Discontinued     2 g 200 mL/hr over 30 Minutes Intravenous Every 8 hours 07/19/18 1349 07/20/18 1615   07/19/18 1330  ceFEPIme (MAXIPIME) 2 g in sodium chloride 0.9 % 100 mL IVPB     2 g 200 mL/hr over 30 Minutes Intravenous  Once 07/19/18 1318 07/19/18 1620   07/19/18 1330   metroNIDAZOLE (FLAGYL) IVPB 500 mg     500 mg 100 mL/hr over 60 Minutes Intravenous  Once 07/19/18 1318 07/19/18 1838   07/19/18 1330  vancomycin (VANCOCIN) IVPB 1000 mg/200 mL premix  Status:  Discontinued     1,000 mg 200 mL/hr over 60 Minutes Intravenous  Once 07/19/18 1318 07/19/18 1320   07/19/18 1330  vancomycin (VANCOCIN) 2,000 mg in sodium chloride 0.9 % 500 mL IVPB     2,000 mg 250 mL/hr over 120 Minutes Intravenous  Once 07/19/18 1320 07/19/18 1621       Objective   Vitals:   07/21/18 0046 07/21/18 0505 07/21/18 0537 07/21/18 0800  BP: (!) 146/62 (!) 120/56    Pulse: (!) 55 (!) 47    Resp: (!) 22 19    Temp: 98.2 F (36.8 C) 98 F (36.7 C)  97.8 F (36.6 C)  TempSrc: Oral Oral    SpO2: 100% 100%    Weight:   106.3 kg   Height:        Intake/Output Summary (Last 24 hours) at 07/21/2018 1237 Last data filed at 07/21/2018 1113 Gross per 24 hour  Intake 3698.81 ml  Output -  Net 3698.81 ml   Filed Weights   07/19/18 1430 07/20/18 0602 07/21/18 0537  Weight: 101.6 kg 102.3 kg 106.3 kg     Physical Examination:  General-appears in no acute distress Heart-S1-S2, regular, no murmur auscultated Lungs-clear to auscultation bilaterally, no wheezing or crackles auscultated Abdomen-soft, nontender, no organomegaly Extremities-no edema in the lower extremities Neuro-alert, oriented x3, no focal deficit noted    Data Reviewed: I have personally reviewed following labs and imaging studies   Recent Results (from the past 240 hour(s))  SARS Coronavirus 2     Status: None   Collection Time: 07/19/18  1:09 PM  Result Value Ref Range Status   SARS Coronavirus 2 NOT DETECTED NOT DETECTED Final    Comment: (NOTE) SARS-CoV-2 target nucleic acids are NOT DETECTED. The SARS-CoV-2 RNA is generally detectable in upper and lower respiratory specimens during the acute phase of infection.  Negative  results do not preclude SARS-CoV-2 infection, do not rule  out co-infections with other pathogens, and should not be used as the sole basis for treatment or other patient management decisions.  Negative results must be combined with clinical observations, patient history, and epidemiological information. The expected result is Not Detected. Fact Sheet for Patients: http://www.biofiredefense.com/wp-content/uploads/2020/03/BIOFIRE-COVID -19-patients.pdf Fact Sheet for Healthcare Providers: http://www.biofiredefense.com/wp-content/uploads/2020/03/BIOFIRE-COVID -19-hcp.pdf This test is not yet approved or cleared by the [place] and  has been authorized for detection and/or diagnosis of SARS-CoV-2 by FDA under an Emergency Use Authorization (EUA).  This EUA will remain in effec t (meaning this test can be used) for the duration of  the COVID-19 declaration under Section 564(b)(1) of the Act, 21 U.S.C. section 360bbb-3(b)(1), unless the authorization is terminated  or revoked sooner. Performed at [facility], [place] [address] [place], [place] [address]   Culture, blood (routine x 2)     Status: None (Preliminary result)   Collection Time: 07/19/18  3:30 PM   Specimen: BLOOD  Result Value Ref Range Status   Specimen Description BLOOD BLOOD LEFT HAND  Final   Special Requests   Final    BOTTLES DRAWN AEROBIC AND ANAEROBIC Blood Culture adequate volume   Culture   Final    NO GROWTH 2 DAYS Performed at [facility], [place] [address], [place], [place] [address]    Report Status PENDING  Incomplete  Culture, blood (routine x 2)     Status: None (Preliminary result)   Collection Time: 07/19/18  3:30 PM   Specimen: BLOOD  Result Value Ref Range Status   Specimen Description BLOOD PICC LINE  Final   Special Requests   Final    BOTTLES DRAWN AEROBIC AND ANAEROBIC Blood Culture adequate volume   Culture   Final    NO GROWTH 2 DAYS Performed at [facility], [place] [address], [place], [place] [address]    Report Status  PENDING  Incomplete     Liver Function Tests: Recent Labs  Lab 07/19/18 1248 07/20/18 0400 07/21/18 0407  AST 112* 64* 69*  ALT 66* 59* 60*  ALKPHOS 477* 323* 293*  BILITOT 2.7* 0.7 0.6  PROT 6.9 5.6* 4.8*  ALBUMIN 3.7 2.9* 2.4*   Recent Labs  Lab 07/19/18 1248  LIPASE 22   No results for input(s): AMMONIA in the last 168 hours.  Cardiac Enzymes: Recent Labs  Lab 07/19/18 1530 07/19/18 1729 07/19/18 2326 07/20/18 0400  TROPONINI 0.14* 0.14* 0.12* 0.11*   BNP (last 3 results) Recent Labs    03/03/18 0927 03/14/18 2011  BNP 174.8* 48.9    ProBNP (last 3 results) No results for input(s): PROBNP in the last 8760 hours.    Studies: Ct Abdomen Pelvis W Contrast  Result Date: 07/19/2018 CLINICAL DATA:  Fever with nausea and vomiting.  Mid abdominal pain. EXAM: CT ABDOMEN AND PELVIS WITH CONTRAST TECHNIQUE: Multidetector CT imaging of the abdomen and pelvis was performed using the standard protocol following bolus administration of intravenous contrast. CONTRAST:  146m OMNIPAQUE IOHEXOL 300 MG/ML  SOLN COMPARISON:  Current abdomen ultrasound.  Prior CT dated 12/17/2017. FINDINGS: Lower chest: No acute abnormality. Hepatobiliary: Diffusely decreased attenuation of the liver. Liver normal in size. No mass or focal lesion. Normal gallbladder. No bile duct dilation. Pancreas: Unremarkable. No pancreatic ductal dilatation or surrounding inflammatory changes. Spleen: Normal in size without focal abnormality. Adrenals/Urinary Tract: Adrenal glands are unremarkable. Kidneys are normal, without renal calculi, focal lesion, or hydronephrosis. Bladder is unremarkable. Stomach/Bowel: Stomach mostly decompressed but otherwise unremarkable. Small bowel and colon are normal in caliber. No wall thickening or inflammation. There scattered colonic diverticula without diverticulitis. Normal appendix visualized. Vascular/Lymphatic: Dense aortic atherosclerotic calcifications. No aneurysm. No  enlarged lymph nodes. Reproductive: Uterus and bilateral adnexa are unremarkable. Other: No abdominal wall hernia or abnormality. No abdominopelvic ascites. Musculoskeletal: No fracture or acute finding. No osteoblastic or osteolytic lesions. IMPRESSION: 1. No acute findings. No findings to account for the patient's symptoms. 2. Hepatic steatosis. 3. Colonic diverticula without diverticulitis. 4. Aortic atherosclerosis. Electronically Signed   By: DLajean ManesM.D.   On: 07/19/2018 18:13   Dg Chest Port 1 View  Result Date: 07/19/2018 CLINICAL DATA:  Chest pain.  Fever and vomiting. EXAM: PORTABLE CHEST  1 VIEW COMPARISON:  04/03/2018 FINDINGS: Artifact overlies the chest. There is a poor inspiration. Allowing for the poor inspiration, no active disease is appreciated. No infiltrate, collapse or effusion. No acute bone finding. Chronic degenerative changes of the shoulders. IMPRESSION: Poor inspiration.  No active disease evident allowing for that. Electronically Signed   By: Nelson Chimes M.D.   On: 07/19/2018 13:28   US Abdomen Limited Ruq  Result Date: 07/19/2018 CLINICAL DATA:  Abdominal pain for 1 day. EXAM: ULTRASOUND ABDOMEN LIMITED RIGHT UPPER QUADRANT COMPARISON:  03/16/2018 FINDINGS: Gallbladder: Small polyp versus a nonshadowing stone along the dependent wall of the gallbladder, measuring 6 mm. No other evidence of a stone. No wall thickening or pericholecystic fluid. Common bile duct: Diameter: 6 mm Liver: Increased parenchymal echogenicity with poor through transmission of the sound beam. No mass or focal lesion. Portal vein is patent on color Doppler imaging with normal direction of blood flow towards the liver. IMPRESSION: 1. No acute findings.  No evidence of acute cholecystitis. 2. Single small, 6 mm, gallbladder polyp versus a nonshadowing stone. 3. Extensive hepatic steatosis. Electronically Signed   By: Lajean Manes M.D.   On: 07/19/2018 16:44    Scheduled Meds: . acetaminophen  1,000  mg Oral Once  . allopurinol  300 mg Oral Daily  . amLODipine  10 mg Oral Daily  . antiseptic oral rinse  15 mL Mouth Rinse TID  . aspirin EC  81 mg Oral Daily  . benazepril  20 mg Oral Daily  . colchicine  0.6 mg Oral BID  . doxepin  25 mg Oral BID  . enoxaparin (LOVENOX) injection  40 mg Subcutaneous Q24H  . famotidine  20 mg Oral QHS  . gabapentin  300 mg Oral TID  . hydrOXYzine  25 mg Oral TID  . insulin aspart  0-15 Units Subcutaneous Q4H  . insulin glargine  4 Units Subcutaneous QHS  . loratadine  10 mg Oral Daily  . nadolol  20 mg Oral Daily  . risperiDONE  0.5 mg Oral BID  . silver sulfADIAZINE   Topical BID  . sodium chloride flush  10-40 mL Intracatheter Q12H    Admission status: Inpatient: Based on patients clinical presentation and evaluation of above clinical data, I have made determination that patient meets Inpatient criteria at this time.  Time spent: 20 min  [facility] Pager [phone]. If 7PM-7AM, please contact night-coverage at [email or web address], Office  [phone]  password [email or web address]  07/21/2018, 12:37 PM  LOS: 2 days

## 2018-07-22 DIAGNOSIS — I4581 Long QT syndrome: Secondary | ICD-10-CM

## 2018-07-22 DIAGNOSIS — I472 Ventricular tachycardia: Secondary | ICD-10-CM | POA: Diagnosis not present

## 2018-07-22 DIAGNOSIS — I10 Essential (primary) hypertension: Secondary | ICD-10-CM | POA: Diagnosis not present

## 2018-07-22 DIAGNOSIS — F200 Paranoid schizophrenia: Secondary | ICD-10-CM | POA: Diagnosis not present

## 2018-07-22 DIAGNOSIS — R109 Unspecified abdominal pain: Secondary | ICD-10-CM | POA: Diagnosis not present

## 2018-07-22 LAB — COMPREHENSIVE METABOLIC PANEL WITH GFR
ALT: 74 U/L — ABNORMAL HIGH (ref 0–44)
AST: 86 U/L — ABNORMAL HIGH (ref 15–41)
Albumin: 2.5 g/dL — ABNORMAL LOW (ref 3.5–5.0)
Alkaline Phosphatase: 349 U/L — ABNORMAL HIGH (ref 38–126)
Anion gap: 5 (ref 5–15)
BUN: 12 mg/dL (ref 8–23)
CO2: 21 mmol/L — ABNORMAL LOW (ref 22–32)
Calcium: 8.2 mg/dL — ABNORMAL LOW (ref 8.9–10.3)
Chloride: 115 mmol/L — ABNORMAL HIGH (ref 98–111)
Creatinine, Ser: 0.9 mg/dL (ref 0.44–1.00)
GFR calc Af Amer: >60 mL/min
GFR calc non Af Amer: >60 mL/min
Glucose, Bld: 128 mg/dL — ABNORMAL HIGH (ref 70–99)
Potassium: 4.6 mmol/L (ref 3.5–5.1)
Sodium: 141 mmol/L (ref 135–145)
Total Bilirubin: 0.9 mg/dL (ref 0.3–1.2)
Total Protein: 5.1 g/dL — ABNORMAL LOW (ref 6.5–8.1)

## 2018-07-22 LAB — GLUCOSE, CAPILLARY
Glucose-Capillary: 103 mg/dL — ABNORMAL HIGH (ref 70–99)
Glucose-Capillary: 105 mg/dL — ABNORMAL HIGH (ref 70–99)
Glucose-Capillary: 145 mg/dL — ABNORMAL HIGH (ref 70–99)
Glucose-Capillary: 160 mg/dL — ABNORMAL HIGH (ref 70–99)
Glucose-Capillary: 213 mg/dL — ABNORMAL HIGH (ref 70–99)

## 2018-07-22 MED ORDER — POTASSIUM CHLORIDE ER 10 MEQ PO TBCR: 10.0000 meq | EXTENDED_RELEASE_TABLET | Freq: Every day | ORAL | 2 refills | Status: DC

## 2018-07-22 MED ORDER — PRO-STAT SUGAR FREE PO LIQD
30.0000 mL | Freq: Two times a day (BID) | ORAL | Status: DC
  Administered 2018-07-22: 30 mL via ORAL
  Filled 2018-07-22: qty 30

## 2018-07-22 MED ORDER — ADULT MULTIVITAMIN W/MINERALS CH
1.0000 | ORAL_TABLET | Freq: Every day | ORAL | Status: DC
  Administered 2018-07-22: 13:00:00 1 via ORAL
  Filled 2018-07-22: qty 1

## 2018-07-22 MED ORDER — ENSURE ENLIVE PO LIQD
237.0000 mL | Freq: Two times a day (BID) | ORAL | Status: DC
  Administered 2018-07-22: 237 mL via ORAL

## 2018-07-22 MED ORDER — NADOLOL 20 MG PO TABS: 20.0000 mg | ORAL_TABLET | Freq: Every day | ORAL | 2 refills | Status: DC

## 2018-07-22 NOTE — Discharge Instructions (Addendum)
Long QT Syndrome  Long QT syndrome (LQTS) is a heart condition in which the heart takes longer than normal to recharge after each heartbeat. This is caused by an abnormal electrical system in the heart. LQTS can upset the timing of your heartbeats. It can cause dangerous changes in your heart rate and rhythm (arrhythmia). There are several types of LQTS. The three most common types are:  Type 1. This can be triggered by stress or exercise, especially swimming.  Type 2. This can be triggered by strong emotions or surprise.  Type 3. This can be triggered when your heart slows during sleep. You can be born with LQTS, or you can develop it later in life. What are the causes? The cause of this condition depends on the type of LQTS that you have.  Inherited LQTS. You are born with this condition. It is caused by an abnormal gene that is passed down through your family.  Acquired LQTS. You get this condition later in life. It may be caused by: ? Certain medicines that affect your heartbeat. Some examples are methadone and antihistamines. ? Long periods of vomiting or diarrhea. ? An eating disorder. ? A thyroid disorder. What increases the risk? This condition is more likely to develop in:  People who are born deaf.  Women.  People who have an eating disorder, such as anorexia nervosa or bulimia.  People who have a family member with LQTS.  People who have a family history of unexplained fainting, drowning, or sudden death. What are the signs or symptoms? Symptoms of this condition include:  Fainting.  A fluttering feeling in your chest.  Loud gasping during sleep.  Seizures. Symptoms of inherited LQTS almost always start before age 43.  Some people with this condition have no symptoms. How is this diagnosed? This condition may be diagnosed based on:  Your symptoms.  Your medical history and family history.  A physical exam.  Some tests, including: ? An  electrocardiogram (ECG) to measure electrical activity in your heart. ? Holter monitoring to record your heartbeat for 1-2 days. ? Stress test to record your heartbeat while you exercise. ? A blood test to look for genes that cause LQTS. How is this treated? There is no cure for this condition. Treatment depends on the cause, your symptoms, and whether you have a family history of sudden death. Treatment may include:  Making lifestyle changes, such as avoiding competitive sports or stressful situations.  Taking supplements to correct abnormal salt (sodium), potassium, calcium, and magnesium levels.  Stopping the use of a medicine. Do not stop the use of medicines without first talking to your health care provider.  Taking heart medicines, such as beta blockers.  Implanting a device that corrects a dangerous heartbeat, such as: ? A pacemaker. This helps your heart beat in a normal rhythm. ? A cardioverter-defibrillator. This senses a fast heartbeat and shocks the heart to restore normal heart rate.  Having heart surgery to prevent arrhythmias. Follow these instructions at home: Medicine  Take over-the-counter and prescription medicines only as told by your health care provider.  If you want to take any new medicine, get approval from your health care provider first. Avoid any medicines that can cause this condition. Lifestyle  Make any lifestyle changes that are recommended by your health care provider. You may need to avoid: ? Competitive sports. ? Strenuous exercises and activities, such as swimming. ? Stress. ? Situations where sudden loud noises are likely.  If you drink  alcohol, limit how much you have: ? 0-2 drinks a day for men. ? 0-1 drink a day for women.  Be aware of how much alcohol is in your drink. In the [place] one drink equals one typical bottle of beer (12 oz), one-half glass of wine (5 oz), or one shot of hard liquor (1 oz).  Do not use any products that  contain nicotine or tobacco, such as cigarettes and e-cigarettes. If you need help quitting, ask your health care provider. General instructions  Develop a plan with your health care provider for how to deal with a sudden arrhythmia.  Tell people who live with you about the signs of a sudden arrhythmia.  Wear a medical ID necklace or bracelet that states your diagnosis and contact information.  Have an automated external defibrillator (AED) available at home or work.  Get treatment and support if you feel stress, fear, anxiety, or depression.  Keep all follow-up visits as told by your health care provider. This is important. Contact a health care provider if:  You are suffering from stress, fear, anxiety, or depression.  You vomit.  You have diarrhea. Get help right away if:  You have chest pain or difficulty breathing.  You have a fluttering feeling in your chest.  You faint.  You have a seizure. These symptoms may represent a serious problem that is an emergency. Do not wait to see if the symptoms will go away. Get medical help right away. Call your local emergency services (911 in the [place] Do not drive yourself to the hospital. Summary  Long QT syndrome (LQTS) is a heart condition in which your heart takes longer than normal to recharge after each heartbeat. This is caused by an abnormal electrical system in your heart.  LQTS can upset the timing of your heartbeats and cause dangerous changes in your heart rate and rhythm (arrhythmia).  Some people are born with LQTS (inherited). Others develop it later in life (acquired).  Some people with this condition have no symptoms. Those who do have symptoms may experience fainting, a fluttering feeling in the chest, loud gasping during sleep, or seizures.  There is no cure for this condition. Treatment depends on the cause, your symptoms, and whether you have a family history of sudden death. This information is not intended to  replace advice given to you by your health care provider. Make sure you discuss any questions you have with your health care provider. Document Released: 11/20/2008 Document Revised: 02/20/2017 Document Reviewed: 02/20/2017 Elsevier Interactive Patient Education  2019 [employer].

## 2018-07-22 NOTE — Progress Notes (Addendum)
   The patient has no cardiac complaints,  Markers were flat and agree likely related to Type II injury.  QT still prolonged on the ECG yesterday but no recurrent polymorphic VT.  Plan continue nadolol for prophylaxis and as already stated by EP(Dr. Allred), "not an ICD candidate". Unable to up-titrate due to relative bradycardia.  She has progeny and siblings, therefore,  genetic testing should be performed.

## 2018-07-22 NOTE — Progress Notes (Signed)
PT Cancellation Note  Patient Details Name: Heather Petty MRN: [id number] DOB: 1950/09/17   Cancelled Treatment:     Pt refused OOB PT eval today. She has DC summary written and hopes to go home soon.  She has not been OOB since admitted.  She said son in law to come get her and get her home and settled. She will be home alone tonight.  She has text/call out to CNA that should be at her home tomorrow at 9am.  She did verify while I was in room that the [facility] that has been working with her - will be at her home tomorrow at 11:30am.  Pt is happy to go home today.  PT eval not complete per pt request.  Rande Lawman, PT   Loyal Buba 07/22/2018, 2:04 PM

## 2018-07-22 NOTE — TOC Transition Note (Signed)
Transition of Care [facility]) - CM/SW Discharge Note   Patient Details  Name: Heather Petty MRN: [id number] Date of Birth: Dec 02, 1950  [facility]) CM/SW Contact:  Bartholomew Crews, RN Phone Number: [phone] 07/22/2018, 2:39 PM   Clinical Narrative:    PTA home alone with family providing caregiver assistance. [place] RN through [facility]. Patient reports needing PTAR transport - transportation arrangements made for 5 pm. [facility] notified of transition home, and of MD order to resume [facility] RN services. No other transition of care needs identified.    Final next level of care: Home w Home Health Services Barriers to Discharge: No Barriers Identified   Patient Goals and CMS Choice   CMS Medicare.gov Compare Post Acute Care list provided to:: Patient Choice offered to / list presented to : Patient  Discharge Placement                       Discharge Plan and Services                DME Arranged: N/A DME Agency: NA       HH Arranged: RN [place] [facility] Agency: [facility] Date [place]: 07/22/18 Time [place]: 3953 Representative spoke with at [place]: [place] ([place]) Interventions     Readmission Risk Interventions No flowsheet data found.

## 2018-07-22 NOTE — Progress Notes (Signed)
Initial Nutrition Assessment  RD working remotely.  DOCUMENTATION CODES:   Morbid obesity  INTERVENTION:   -MVI with minerals daily -Ensure Enlive po BID, each supplement provides 350 kcal and 20 grams of protein -30 ml Prostat BID, each supplement provides 100 kcals and 15 grams protein  NUTRITION DIAGNOSIS:   Increased nutrient needs related to wound healing as evidenced by estimated needs.  GOAL:   Patient will meet greater than or equal to 90% of their needs  MONITOR:   PO intake, Supplement acceptance, Labs, Weight trends, I & O's  REASON FOR ASSESSMENT:   Other (Comment)    ASSESSMENT:   68 year old female presents with fever, nausea and vomiting with mild elevation in transaminases and her bilirubin.  She does have an elevated lactate however blood pressures are stable.  Abdominal CT and right upper quadrant ultrasound are negative for acute cholecystitis or intra-abdominal infection.  He is also noted to have sustained an NSVT as well as an elevation in troponin at 0.14.  She is hemodynamically stable  Pt admitted with fever of unknown etiology.   Reviewed I/O's: +510 ml x 24 hours and +4.2 L since admission  Per CWOCN note, pt with partial thickness wound on buttocks.  Attempted to speak with pt via phone, however, unable to reach. RD unable to obtain further nutrition-related history at this time. Per H&P, pt has a caregiver who prepares her meals.  Reviewed wt hx, which reveals with has experienced a 21.9% wt loss over the past 7 months, which is significant for time frame. Unsure if this weight loss was unintentional.    Pt currently with poor appetite, noted meal completion 5%. Pt with increased nutritional needs related to chronic wound. Pt with poor oral intake and would benefit from nutrient dense supplement. One Ensure Enlive supplement provides 350 kcals, 20 grams protein, and 44-45 grams of carbohydrate vs one Glucerna shake supplement, which provides  220 kcals, 10 grams of protein, and 26 grams of carbohydrate. Given pt's hx of DM, RD will continue to monitor PO intake, CBGS, and adjust supplement regimen as appropriate.   Lab Results  Component Value Date   HGBA1C 6.5 (H) 07/19/2018   PTA DM medications are 0-12 units insulin aspart TID with meals and q HS and 7 units insulin glargine q HS.   Labs reviewed: CBGS: 103-164 (inpatient orders for glycemic control are 0-15 units insulin aspart every 4 hours and 4 units insulin glargine q HS).   NUTRITION - FOCUSED PHYSICAL EXAM:    Most Recent Value  Orbital Region  Unable to assess  Upper Arm Region  Unable to assess  Thoracic and Lumbar Region  Unable to assess  Buccal Region  Unable to assess  Temple Region  Unable to assess  Clavicle Bone Region  Unable to assess  Clavicle and Acromion Bone Region  Unable to assess  Scapular Bone Region  Unable to assess  Dorsal Hand  Unable to assess  Patellar Region  Unable to assess  Anterior Thigh Region  Unable to assess  Posterior Calf Region  Unable to assess  Edema (RD Assessment)  Unable to assess  Hair  Unable to assess  Eyes  Unable to assess  Mouth  Unable to assess  Skin  Unable to assess  Nails  Unable to assess       Diet Order:   Diet Order            DIET SOFT Room service appropriate? No; Fluid consistency: Thin  Diet effective now              EDUCATION NEEDS:   No education needs have been identified at this time  Skin:  Skin Assessment: Skin Integrity Issues: Skin Integrity Issues:: Stage II Stage II: rt buttocks  Last BM:  07/21/18  Height:   Ht Readings from Last 1 Encounters:  07/19/18 5\' 1"  (1.549 m)    Weight:   Wt Readings from Last 1 Encounters:  07/21/18 106.3 kg    Ideal Body Weight:  47.7 kg  BMI:  Body mass index is 44.28 kg/m.  Estimated Nutritional Needs:   Kcal:  1650-1850  Protein:  105-120 grams  Fluid:  > 1.6 L    Kweku Stankey A. Jimmye Norman, RD, LDN, [place] Registered  Dietitian II Certified Diabetes Care and Education Specialist Pager: [phone] After hours Pager: [phone]

## 2018-07-22 NOTE — Progress Notes (Signed)
Pt discharge home at 2121. Transported home by [facility]. Discharge teaching completed, IV and all lines removed. VS stable. All questions answered. Pt refused he Novolog dose stating she did not eat enough.

## 2018-07-22 NOTE — Consult Note (Signed)
[facility] Nurse wound consult note Reason for Consult: Consult requested for sacrum/buttocks.   Wound type: Sacrum with pink moist intact skin.  No draining area or full thickness wound noted.  Left buttock with red partial thickness linear wound; 3X.1X.1cm, no odor or drainage Dressing procedure/placement/frequency: Pt is incontinent of large amt liquid stool and dressings would frequently become soiled.  Barrier cream to protect and repel moisture.  Please re-consult if further assistance is needed.  Thank-you,  Julien Girt MSN, [place], [place], [place], [place]

## 2018-07-22 NOTE — Discharge Summary (Signed)
Physician Discharge Summary  Heather Petty IWL:[id number] DOB: 29-Apr-1950 DOA: 07/19/2018  PCP: [name], MD  Admit date: 07/19/2018 Discharge date: 07/22/2018  Time spent:60 minutes  Recommendations for Outpatient Follow-up:   1. Patient has long QT syndrome, I discussed with patient that she needs to inform her relatives including brothers, sisters and children to get tested for genetic long QT syndrome.  They should contact their PCPs regarding long QT syndrome genetic testing.  2. Avoid QT prolonging agents.  3. Patient started on nadolol 20 mg p.o. daily  4. Recommended to keep magnesium greater than 1.9, potassium greater than 3.9.  5. Will be discharged on K-Dur 10 mEq daily as patient is on  Lasix 20 mg p.o. 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  She developed nausea and vomiting.  Also complained of abdominal pain. CT abdomen pelvis done in the ED shows no acute abnormality.Shows hepatic steatosis. Patient also had nonsustained V. tach with elevated troponin, seen by cardiology in the ED, initially started on IV amiodarone.   Hospital Course:    1. Fever--resolved, unclear etiology.  UA is clear, chest x-ray shows no acute disease.  Patient was empirically started on vancomycin and cefepime.WBC is down to 6.6.  Lactic acid was 1.4 on admission.  IV antibiotics were discontinued.  Very low suspicion for sepsis.  Patient has been afebrile since stopping antibiotics.  2. Nausea and vomiting/transaminitis-resolved, patient is tolerating regular diet.  CT scan of the abdomen shows hepatic steatosis, this is likely the cause of nausea and vomiting.  LFTs are back to baseline..  There is no history of alcohol use.    Patient has been very noncompliant with healthy diet as per patient's daughter.  Patient has similar presentation in November at that time hepatitis panel was only positive for hepatitis A IgM.  3. Sacral wound-wound looks clean, wound care was consulted.  The wound is clean and dry with no discharge.  Continue local wound care.  4. Long QT syndrome/nonsustained polymorphic V. [place] cardiology assistance, patient started on nadolol.  Amiodarone has been discontinued.  Plan to keep magnesium more than 1.9 and potassium more than 3.9.  Will discontinue QT prolonging agents including hydroxyzine, doxepin.  5. Diabetes mellitus type 2-glucose is controlled, continue Lantus 4 units subcu daily at bedtime, sliding scale insulin NovoLog.  6. Hypertension-blood pressure stable continue amlodipine, benazepril.  Metoprolol discontinued and nadolol started per cardiology.  7. Schizophrenia-continue risperidone  8. Gout-continue allopurinol, colchicine.  9. Chronic diastolic CHF-patient takes Lasix 20 mg p.o. daily at home.  We will continue with Lasix, and add K. Dur 10 mEq p.o. daily.  Goal is to keep potassium greater than 3.9.  Procedures:  Consultations:  Cardiology  Discharge Exam: Vitals:   07/22/18 0913 07/22/18 1229  BP:    Pulse:    Resp:    Temp:  98.3 F (36.8 C)  SpO2: 100%     General:  Appears in no acute distress Cardiovascular: S1-S2, regular, no murmur auscultated Respiratory: Clear to auscultation bilaterally  Discharge Instructions   Discharge Instructions    Diet - low sodium heart healthy   Complete by: As directed    Discharge instructions   Complete by: As directed    Your children and brothers and sisters need to be tested for "Genetic test for long QT syndrome".They should inform their PCPs that you have long QT syndrome.   Increase activity slowly   Complete by: As directed      Allergies as of 07/22/2018      Reactions   Lasix [furosemide] Other (See Comments)   IV-LASIX ==> Reaction: Burning      Medication List    STOP taking these medications   doxepin 25 MG capsule Commonly known as: SINEQUAN   famotidine 20 MG tablet Commonly known as: PEPCID   hydrOXYzine 25 MG tablet Commonly known as: ATARAX/VISTARIL   pravastatin 40 MG tablet Commonly known as: PRAVACHOL     TAKE these medications   acetaminophen 325 MG tablet Commonly known as: TYLENOL Take 650 mg by mouth 4 (four) times daily as needed for headache (pain).   allopurinol 300 MG tablet Commonly known as: ZYLOPRIM Take 300 mg by mouth daily.   amLODipine 10 MG tablet Commonly known as: NORVASC Take 10 mg by mouth daily.   antiseptic oral rinse Liqd 15 mLs by Mouth Rinse route 3 (three) times daily. Swish and spit   b complex vitamins tablet Take 1 tablet by mouth daily.   benazepril 20 MG tablet Commonly known as: LOTENSIN Take 20 mg by mouth daily.   bisacodyl 5 MG EC tablet Commonly known as: DULCOLAX Take 1 tablet (5 mg total) by mouth daily as needed for moderate constipation.   cetirizine 10 MG tablet Commonly known as: ZYRTEC Take 10 mg by mouth daily.   colchicine 0.6 MG tablet Take 1 tablet (0.6 mg total) by mouth 2 (two) times daily.   collagenase ointment Commonly known as: SANTYL Apply topically daily. What changed:   how much to  take  when to take this  additional instructions   cyanocobalamin 1000 MCG/ML injection Commonly known as: (VITAMIN B-12) Inject 1 mL (1,000 mcg total) into the muscle once a week.   Dymista 137-50 MCG/ACT Susp Generic drug: Azelastine-Fluticasone Place 1 spray into both nostrils 2 (two) times daily as needed (for allergies).   Eucerin Eczema Relief 1 % Crea Generic drug: Colloidal Oatmeal Apply 1 application topically 2 (two) times daily.   folic acid 1 MG tablet Commonly known as: FOLVITE Take 1 mg by mouth daily.   furosemide 20 MG tablet Commonly known as: LASIX Take 20 mg by mouth daily.   gabapentin 300 MG capsule Commonly known as: NEURONTIN Take 300 mg by mouth 3 (three) times daily.   hydrocortisone 2.5 % cream Apply 1 application topically See admin instructions. Apply topically to rash on face twice daily   insulin aspart 100 UNIT/ML injection Commonly known as: NovoLOG Take subcu QA CHS, 140-199 - 2 units, 200-250 - 6 units, 251-299 - 8 units,  300-349 - 10 units,  350 or above 14 units. What changed:   how much to take  how to take this  when to take this  additional instructions   insulin glargine 100 UNIT/ML injection Commonly known as: LANTUS Inject 0.07 mLs (7 Units total) into the skin at bedtime.   ipratropium-albuterol 0.5-2.5 (3) MG/3ML Soln Commonly known as: DUONEB Take 3 mLs by nebulization every 6 (six) hours as needed (for shortness of breath or wheezing).   nadolol 20 MG tablet Commonly known as: CORGARD Take 1 tablet (20 mg total) by mouth daily. Start taking on: July 23, 2018   nystatin powder Commonly known as: MYCOSTATIN/NYSTOP Apply 1 g topically See admin instructions. Apply topically twice daily as needed for wound care  -  between folds, beneath breasts, bilateral axillae, and under pannus   polyethylene glycol 17 g packet Commonly known as: MIRALAX / GLYCOLAX Take 17 g by mouth 2 (two) times daily. What changed:    when to take this  reasons to take this   potassium chloride 10 MEQ tablet Commonly known as: K-DUR Take 1 tablet (10 mEq total) by mouth daily. Take with Lasix. Do not take if you stop lasix.   risperiDONE 0.5 MG tablet Commonly known as: RISPERDAL Take 1 tablet (0.5 mg total) by mouth 2 (two) times daily.   silver sulfADIAZINE 1 % cream Commonly known as: SILVADENE Apply topically 2 (two) times daily.   THERA-M MULTIPLE VITAMINS PO Take 1 tablet by mouth daily.   traMADol 50 MG tablet Commonly known as: ULTRAM Take 50 mg by mouth 4 (four) times daily as needed (pain).   triamcinolone 0.147 MG/GM topical spray Commonly known as: KENALOG Apply 1 spray topically See admin instructions. Apply topically to head in the direction of hair growth daily at bedtime   Ventolin HFA 108 (90 Base) MCG/ACT inhaler Generic drug: albuterol Inhale 2 puffs into the lungs every 4 (four) hours as needed for wheezing or shortness of breath.   vitamin C 500 MG tablet Commonly known as: ASCORBIC ACID Take 500 mg by mouth 2 (two) times daily.   Vitamin D (Ergocalciferol) 1.25 MG (50000 UT) Caps capsule Commonly known as: DRISDOL Take 50,000 Units by mouth every Monday.   ZINC-220 PO Take 220 mg by mouth at bedtime.      Allergies  Allergen Reactions  . Lasix [Furosemide] Other (See Comments)    IV-LASIX ==> Reaction: Burning      The results of significant diagnostics from this hospitalization (including imaging, microbiology, ancillary and laboratory) are listed below for reference.    Significant Diagnostic Studies: Ct Abdomen Pelvis W Contrast  Result Date: 07/19/2018 CLINICAL DATA:  Fever with nausea and vomiting.  Mid abdominal pain. EXAM: CT ABDOMEN AND PELVIS WITH CONTRAST TECHNIQUE: Multidetector CT imaging of the abdomen and pelvis was performed using the standard protocol following bolus administration of intravenous contrast. CONTRAST:  17m OMNIPAQUE IOHEXOL 300  MG/ML  SOLN COMPARISON:  Current abdomen ultrasound.  Prior CT dated 12/17/2017. FINDINGS: Lower chest: No acute abnormality. Hepatobiliary: Diffusely decreased attenuation of the liver. Liver normal in size. No mass or focal lesion. Normal gallbladder. No bile duct dilation. Pancreas: Unremarkable. No pancreatic ductal dilatation or surrounding inflammatory changes. Spleen: Normal in size without focal abnormality. Adrenals/Urinary Tract: Adrenal glands are unremarkable. Kidneys are normal, without renal calculi, focal lesion, or hydronephrosis. Bladder is unremarkable. Stomach/Bowel: Stomach mostly decompressed but otherwise unremarkable. Small bowel and colon are normal in caliber. No wall thickening or inflammation. There scattered colonic diverticula without diverticulitis. Normal appendix visualized. Vascular/Lymphatic: Dense aortic atherosclerotic calcifications. No aneurysm. No enlarged lymph nodes. Reproductive: Uterus  and bilateral adnexa are unremarkable. Other: No abdominal wall hernia or abnormality. No abdominopelvic ascites. Musculoskeletal: No fracture or acute finding. No osteoblastic or osteolytic lesions. IMPRESSION: 1. No acute findings. No findings to account for the patient's symptoms. 2. Hepatic steatosis. 3. Colonic diverticula without diverticulitis. 4. Aortic atherosclerosis. Electronically Signed   By: Lajean Manes M.D.   On: 07/19/2018 18:13   Dg Chest Port 1 View  Result Date: 07/19/2018 CLINICAL DATA:  Chest pain.  Fever and vomiting. EXAM: PORTABLE CHEST 1 VIEW COMPARISON:  04/03/2018 FINDINGS: Artifact overlies the chest. There is a poor inspiration. Allowing for the poor inspiration, no active disease is appreciated. No infiltrate, collapse or effusion. No acute bone finding. Chronic degenerative changes of the shoulders. IMPRESSION: Poor inspiration.  No active disease evident allowing for that. Electronically Signed   By: Nelson Chimes M.D.   On: 07/19/2018 13:28   US Abdomen  Limited Ruq  Result Date: 07/19/2018 CLINICAL DATA:  Abdominal pain for 1 day. EXAM: ULTRASOUND ABDOMEN LIMITED RIGHT UPPER QUADRANT COMPARISON:  03/16/2018 FINDINGS: Gallbladder: Small polyp versus a nonshadowing stone along the dependent wall of the gallbladder, measuring 6 mm. No other evidence of a stone. No wall thickening or pericholecystic fluid. Common bile duct: Diameter: 6 mm Liver: Increased parenchymal echogenicity with poor through transmission of the sound beam. No mass or focal lesion. Portal vein is patent on color Doppler imaging with normal direction of blood flow towards the liver. IMPRESSION: 1. No acute findings.  No evidence of acute cholecystitis. 2. Single small, 6 mm, gallbladder polyp versus a nonshadowing stone. 3. Extensive hepatic steatosis. Electronically Signed   By: Lajean Manes M.D.   On: 07/19/2018 16:44    Microbiology: Recent Results (from the past 240 hour(s))  SARS Coronavirus 2     Status: None   Collection Time: 07/19/18  1:09 PM  Result Value Ref Range Status   SARS Coronavirus 2 NOT DETECTED NOT DETECTED Final    Comment: (NOTE) SARS-CoV-2 target nucleic acids are NOT DETECTED. The SARS-CoV-2 RNA is generally detectable in upper and lower respiratory specimens during the acute phase of infection.  Negative  results do not preclude SARS-CoV-2 infection, do not rule out co-infections with other pathogens, and should not be used as the sole basis for treatment or other patient management decisions.  Negative results must be combined with clinical observations, patient history, and epidemiological information. The expected result is Not Detected. Fact Sheet for Patients: http://www.biofiredefense.com/wp-content/uploads/2020/03/BIOFIRE-COVID -19-patients.pdf Fact Sheet for Healthcare Providers: http://www.biofiredefense.com/wp-content/uploads/2020/03/BIOFIRE-COVID -19-hcp.pdf This test is not yet approved or cleared by the [place] and  has  been authorized for detection and/or diagnosis of SARS-CoV-2 by FDA under an Emergency Use Authorization (EUA).  This EUA will remain in effec t (meaning this test can be used) for the duration of  the COVID-19 declaration under Section 564(b)(1) of the Act, 21 U.S.C. section 360bbb-3(b)(1), unless the authorization is terminated or revoked sooner. Performed at [facility], [place] [address] [place], [place] [address]   Culture, blood (routine x 2)     Status: None (Preliminary result)   Collection Time: 07/19/18  3:30 PM   Specimen: BLOOD  Result Value Ref Range Status   Specimen Description BLOOD BLOOD LEFT HAND  Final   Special Requests   Final    BOTTLES DRAWN AEROBIC AND ANAEROBIC Blood Culture adequate volume   Culture   Final    NO GROWTH 2 DAYS Performed at [facility], [address] [place], [place] [address]    Report Status PENDING  Incomplete  Culture, blood (routine x 2)     Status: None (Preliminary result)   Collection Time: 07/19/18  3:30 PM   Specimen: BLOOD  Result Value Ref Range Status   Specimen Description BLOOD PICC LINE  Final   Special Requests   Final    BOTTLES DRAWN AEROBIC AND ANAEROBIC Blood Culture adequate volume   Culture   Final    NO GROWTH 2 DAYS Performed at [facility], [place] [address] [place], [place] [address]    Report Status PENDING  Incomplete     Labs: Basic Metabolic Panel: Recent Labs  Lab 07/19/18 1248 07/19/18 1400 07/20/18 0400 07/21/18 0407 07/22/18 0452  NA 137  --  137 139 141  K 4.5  --  3.5 3.6 4.6  CL 102  --  105 110 115*  CO2 22  --  23 22 21*  GLUCOSE 246*  --  159* 106* 128*  BUN 8  --  8 12 12   CREATININE 0.75  --  0.86 0.90 0.90  CALCIUM 9.5  --  8.5* 8.1* 8.2*  MG  --  1.5* 2.5*  --   --    Liver Function Tests: Recent Labs  Lab 07/19/18 1248 07/20/18 0400 07/21/18 0407 07/22/18 0452  AST 112* 64* 69* 86*  ALT 66* 59* 60* 74*  ALKPHOS 477* 323* 293* 349*  BILITOT 2.7*  0.7 0.6 0.9  PROT 6.9 5.6* 4.8* 5.1*  ALBUMIN 3.7 2.9* 2.4* 2.5*   Recent Labs  Lab 07/19/18 1248  LIPASE 22   No results for input(s): AMMONIA in the last 168 hours. CBC: Recent Labs  Lab 07/19/18 1248 07/20/18 0400 07/21/18 0407  WBC 10.7* 6.6 5.3  NEUTROABS 8.9*  --   --   HGB 13.9 11.3* 10.4*  HCT 43.1 34.9* 33.5*  MCV 95.4 95.6 100.3*  PLT 216 144* 128*   Cardiac Enzymes: Recent Labs  Lab 07/19/18 1530 07/19/18 1729 07/19/18 2326 07/20/18 0400  TROPONINI 0.14* 0.14* 0.12* 0.11*   BNP: BNP (last 3 results) Recent Labs    03/03/18 0927 03/14/18 2011  BNP 174.8* 48.9    ProBNP (last 3 results) No results for input(s): PROBNP in the last 8760 hours.  CBG: Recent Labs  Lab 07/21/18 2108 07/22/18 0021 07/22/18 0334 07/22/18 0832 07/22/18 1228  GLUCAP 164* 145* 103* 105* 160*       Signed:  Oswald Hillock MD.  [facility] 07/22/2018, 1:01 PM

## 2018-07-22 NOTE — Progress Notes (Addendum)
1247: Call to daughter to inform that QT syndrome is elevated. Verbalized understanding and wanting to have EKG soon.  1310: Patient daughter states patient will be unable to get into home until after 1600. Social worker informed.

## 2018-07-24 LAB — CULTURE, BLOOD (ROUTINE X 2)
Culture: NO GROWTH
Culture: NO GROWTH
Special Requests: ADEQUATE
Special Requests: ADEQUATE

## 2019-07-21 ENCOUNTER — Emergency Department (HOSPITAL_COMMUNITY): Payer: Medicare Other

## 2019-07-21 ENCOUNTER — Inpatient Hospital Stay (HOSPITAL_COMMUNITY)
Admission: EM | Admit: 2019-07-21 | Discharge: 2019-08-01 | DRG: 871 | Disposition: A | Discharge status: Skilled Nursing Facility | Payer: Medicare Other | Attending: Internal Medicine | Admitting: Internal Medicine

## 2019-07-21 ENCOUNTER — Encounter (HOSPITAL_COMMUNITY): Payer: Self-pay

## 2019-07-21 DIAGNOSIS — E11649 Type 2 diabetes mellitus with hypoglycemia without coma: Secondary | ICD-10-CM | POA: Diagnosis not present

## 2019-07-21 DIAGNOSIS — E1169 Type 2 diabetes mellitus with other specified complication: Secondary | ICD-10-CM | POA: Diagnosis present

## 2019-07-21 DIAGNOSIS — Z91138 Patient's unintentional underdosing of medication regimen for other reason: Secondary | ICD-10-CM

## 2019-07-21 DIAGNOSIS — Z888 Allergy status to other drugs, medicaments and biological substances status: Secondary | ICD-10-CM

## 2019-07-21 DIAGNOSIS — M109 Gout, unspecified: Secondary | ICD-10-CM | POA: Diagnosis present

## 2019-07-21 DIAGNOSIS — A419 Sepsis, unspecified organism: Secondary | ICD-10-CM | POA: Diagnosis present

## 2019-07-21 DIAGNOSIS — B961 Klebsiella pneumoniae [K. pneumoniae] as the cause of diseases classified elsewhere: Secondary | ICD-10-CM | POA: Diagnosis present

## 2019-07-21 DIAGNOSIS — K5730 Diverticulosis of large intestine without perforation or abscess without bleeding: Secondary | ICD-10-CM | POA: Diagnosis present

## 2019-07-21 DIAGNOSIS — M6282 Rhabdomyolysis: Secondary | ICD-10-CM | POA: Diagnosis present

## 2019-07-21 DIAGNOSIS — I4581 Long QT syndrome: Secondary | ICD-10-CM | POA: Diagnosis present

## 2019-07-21 DIAGNOSIS — B952 Enterococcus as the cause of diseases classified elsewhere: Secondary | ICD-10-CM | POA: Diagnosis present

## 2019-07-21 DIAGNOSIS — M1990 Unspecified osteoarthritis, unspecified site: Secondary | ICD-10-CM | POA: Diagnosis present

## 2019-07-21 DIAGNOSIS — I252 Old myocardial infarction: Secondary | ICD-10-CM

## 2019-07-21 DIAGNOSIS — E1111 Type 2 diabetes mellitus with ketoacidosis with coma: Secondary | ICD-10-CM

## 2019-07-21 DIAGNOSIS — E1142 Type 2 diabetes mellitus with diabetic polyneuropathy: Secondary | ICD-10-CM | POA: Diagnosis present

## 2019-07-21 DIAGNOSIS — E1129 Type 2 diabetes mellitus with other diabetic kidney complication: Secondary | ICD-10-CM | POA: Diagnosis present

## 2019-07-21 DIAGNOSIS — W19XXXA Unspecified fall, initial encounter: Secondary | ICD-10-CM | POA: Diagnosis present

## 2019-07-21 DIAGNOSIS — G9341 Metabolic encephalopathy: Secondary | ICD-10-CM | POA: Diagnosis present

## 2019-07-21 DIAGNOSIS — R0682 Tachypnea, not elsewhere classified: Secondary | ICD-10-CM

## 2019-07-21 DIAGNOSIS — S31000A Unspecified open wound of lower back and pelvis without penetration into retroperitoneum, initial encounter: Secondary | ICD-10-CM

## 2019-07-21 DIAGNOSIS — Z8673 Personal history of transient ischemic attack (TIA), and cerebral infarction without residual deficits: Secondary | ICD-10-CM

## 2019-07-21 DIAGNOSIS — M25511 Pain in right shoulder: Secondary | ICD-10-CM | POA: Diagnosis not present

## 2019-07-21 DIAGNOSIS — F329 Major depressive disorder, single episode, unspecified: Secondary | ICD-10-CM | POA: Diagnosis present

## 2019-07-21 DIAGNOSIS — L8990 Pressure ulcer of unspecified site, unspecified stage: Secondary | ICD-10-CM | POA: Diagnosis present

## 2019-07-21 DIAGNOSIS — E7800 Pure hypercholesterolemia, unspecified: Secondary | ICD-10-CM | POA: Diagnosis present

## 2019-07-21 DIAGNOSIS — R000 Tachycardia, unspecified: Secondary | ICD-10-CM | POA: Diagnosis present

## 2019-07-21 DIAGNOSIS — E1110 Type 2 diabetes mellitus with ketoacidosis without coma: Secondary | ICD-10-CM | POA: Diagnosis present

## 2019-07-21 DIAGNOSIS — A4102 Sepsis due to Methicillin resistant Staphylococcus aureus: Secondary | ICD-10-CM | POA: Diagnosis not present

## 2019-07-21 DIAGNOSIS — Z8701 Personal history of pneumonia (recurrent): Secondary | ICD-10-CM

## 2019-07-21 DIAGNOSIS — E876 Hypokalemia: Secondary | ICD-10-CM | POA: Diagnosis present

## 2019-07-21 DIAGNOSIS — K8020 Calculus of gallbladder without cholecystitis without obstruction: Secondary | ICD-10-CM | POA: Diagnosis present

## 2019-07-21 DIAGNOSIS — Z79899 Other long term (current) drug therapy: Secondary | ICD-10-CM

## 2019-07-21 DIAGNOSIS — N762 Acute vulvitis: Secondary | ICD-10-CM | POA: Diagnosis present

## 2019-07-21 DIAGNOSIS — Y92012 Bathroom of single-family (private) house as the place of occurrence of the external cause: Secondary | ICD-10-CM

## 2019-07-21 DIAGNOSIS — L80 Vitiligo: Secondary | ICD-10-CM | POA: Diagnosis present

## 2019-07-21 DIAGNOSIS — R52 Pain, unspecified: Secondary | ICD-10-CM

## 2019-07-21 DIAGNOSIS — Z794 Long term (current) use of insulin: Secondary | ICD-10-CM

## 2019-07-21 DIAGNOSIS — Z20822 Contact with and (suspected) exposure to covid-19: Secondary | ICD-10-CM | POA: Diagnosis present

## 2019-07-21 DIAGNOSIS — E860 Dehydration: Secondary | ICD-10-CM | POA: Diagnosis present

## 2019-07-21 DIAGNOSIS — Z6841 Body Mass Index (BMI) 40.0 and over, adult: Secondary | ICD-10-CM

## 2019-07-21 DIAGNOSIS — N390 Urinary tract infection, site not specified: Secondary | ICD-10-CM | POA: Diagnosis present

## 2019-07-21 DIAGNOSIS — E785 Hyperlipidemia, unspecified: Secondary | ICD-10-CM | POA: Diagnosis present

## 2019-07-21 DIAGNOSIS — L304 Erythema intertrigo: Secondary | ICD-10-CM | POA: Diagnosis present

## 2019-07-21 DIAGNOSIS — Z5320 Procedure and treatment not carried out because of patient's decision for unspecified reasons: Secondary | ICD-10-CM | POA: Diagnosis present

## 2019-07-21 DIAGNOSIS — L89152 Pressure ulcer of sacral region, stage 2: Secondary | ICD-10-CM | POA: Diagnosis present

## 2019-07-21 DIAGNOSIS — T383X6A Underdosing of insulin and oral hypoglycemic [antidiabetic] drugs, initial encounter: Secondary | ICD-10-CM | POA: Diagnosis present

## 2019-07-21 DIAGNOSIS — J45909 Unspecified asthma, uncomplicated: Secondary | ICD-10-CM | POA: Diagnosis present

## 2019-07-21 DIAGNOSIS — R7881 Bacteremia: Secondary | ICD-10-CM | POA: Diagnosis present

## 2019-07-21 DIAGNOSIS — L89899 Pressure ulcer of other site, unspecified stage: Secondary | ICD-10-CM | POA: Diagnosis present

## 2019-07-21 DIAGNOSIS — N179 Acute kidney failure, unspecified: Secondary | ICD-10-CM | POA: Diagnosis present

## 2019-07-21 DIAGNOSIS — F200 Paranoid schizophrenia: Secondary | ICD-10-CM | POA: Diagnosis present

## 2019-07-21 DIAGNOSIS — I10 Essential (primary) hypertension: Secondary | ICD-10-CM | POA: Diagnosis present

## 2019-07-21 DIAGNOSIS — B368 Other specified superficial mycoses: Secondary | ICD-10-CM | POA: Diagnosis present

## 2019-07-21 DIAGNOSIS — Z87891 Personal history of nicotine dependence: Secondary | ICD-10-CM

## 2019-07-21 LAB — CBC WITH DIFFERENTIAL/PLATELET
Abs Immature Granulocytes: 0.06 10*3/uL (ref 0.00–0.07)
Basophils Absolute: 0 10*3/uL (ref 0.0–0.1)
Basophils Relative: 0 %
Eosinophils Absolute: 0 10*3/uL (ref 0.0–0.5)
Eosinophils Relative: 0 %
HCT: 42.1 % (ref 36.0–46.0)
Hemoglobin: 13 g/dL (ref 12.0–15.0)
Immature Granulocytes: 1 %
Lymphocytes Relative: 4 %
Lymphs Abs: 0.4 10*3/uL — ABNORMAL LOW (ref 0.7–4.0)
MCH: 30 pg (ref 26.0–34.0)
MCHC: 30.9 g/dL (ref 30.0–36.0)
MCV: 97 fL (ref 80.0–100.0)
Monocytes Absolute: 0.4 10*3/uL (ref 0.1–1.0)
Monocytes Relative: 4 %
Neutro Abs: 9.5 10*3/uL — ABNORMAL HIGH (ref 1.7–7.7)
Neutrophils Relative %: 91 %
Platelets: 179 10*3/uL (ref 150–400)
RBC: 4.34 MIL/uL (ref 3.87–5.11)
RDW: 13 % (ref 11.5–15.5)
WBC: 10.5 10*3/uL (ref 4.0–10.5)
nRBC: 0 % (ref 0.0–0.2)

## 2019-07-21 LAB — URINALYSIS, ROUTINE W REFLEX MICROSCOPIC
Bilirubin Urine: NEGATIVE
Glucose, UA: >=500 mg/dL — AB
Ketones, ur: 20 mg/dL — AB
Leukocytes,Ua: NEGATIVE
Nitrite: NEGATIVE
Protein, ur: 100 mg/dL — AB
Specific Gravity, Urine: 1.023 (ref 1.005–1.030)
pH: 5 (ref 5.0–8.0)

## 2019-07-21 LAB — COMPREHENSIVE METABOLIC PANEL
ALT: 29 U/L (ref 0–44)
AST: 34 U/L (ref 15–41)
Albumin: 3.4 g/dL — ABNORMAL LOW (ref 3.5–5.0)
Alkaline Phosphatase: 196 U/L — ABNORMAL HIGH (ref 38–126)
Anion gap: 20 — ABNORMAL HIGH (ref 5–15)
BUN: 20 mg/dL (ref 8–23)
CO2: 17 mmol/L — ABNORMAL LOW (ref 22–32)
Calcium: 9.4 mg/dL (ref 8.9–10.3)
Chloride: 100 mmol/L (ref 98–111)
Creatinine, Ser: 1.42 mg/dL — ABNORMAL HIGH (ref 0.44–1.00)
GFR calc Af Amer: 43 mL/min — ABNORMAL LOW (ref >60)
GFR calc non Af Amer: 37 mL/min — ABNORMAL LOW (ref >60)
Glucose, Bld: 597 mg/dL (ref 70–99)
Potassium: 4.4 mmol/L (ref 3.5–5.1)
Sodium: 137 mmol/L (ref 135–145)
Total Bilirubin: 1.3 mg/dL — ABNORMAL HIGH (ref 0.3–1.2)
Total Protein: 7.3 g/dL (ref 6.5–8.1)

## 2019-07-21 LAB — LACTIC ACID, PLASMA: Lactic Acid, Venous: 3.2 mmol/L (ref 0.5–1.9)

## 2019-07-21 LAB — PROTIME-INR
INR: 1.3 — ABNORMAL HIGH (ref 0.8–1.2)
Prothrombin Time: 15.6 seconds — ABNORMAL HIGH (ref 11.4–15.2)

## 2019-07-21 LAB — CK: Total CK: 695 U/L — ABNORMAL HIGH (ref 38–234)

## 2019-07-21 LAB — CBG MONITORING, ED: Glucose-Capillary: 548 mg/dL (ref 70–99)

## 2019-07-21 LAB — MAGNESIUM: Magnesium: 1.8 mg/dL (ref 1.7–2.4)

## 2019-07-21 LAB — SARS CORONAVIRUS 2 BY RT PCR (HOSPITAL ORDER, PERFORMED IN ~~LOC~~ HOSPITAL LAB): SARS Coronavirus 2: NEGATIVE

## 2019-07-21 MED ORDER — ACETAMINOPHEN 650 MG RE SUPP
650.0000 mg | Freq: Once | RECTAL | Status: AC
  Administered 2019-07-21: 650 mg via RECTAL
  Filled 2019-07-21: qty 1

## 2019-07-21 MED ORDER — SODIUM CHLORIDE 0.9 % IV SOLN
2.0000 g | Freq: Once | INTRAVENOUS | Status: AC
  Administered 2019-07-21: 2 g via INTRAVENOUS
  Filled 2019-07-21: qty 2

## 2019-07-21 MED ORDER — VANCOMYCIN HCL IN DEXTROSE 1-5 GM/200ML-% IV SOLN: 1000.0000 mg | Freq: Once | INTRAVENOUS | Status: DC

## 2019-07-21 MED ORDER — METRONIDAZOLE IN NACL 5-0.79 MG/ML-% IV SOLN
500.0000 mg | Freq: Once | INTRAVENOUS | Status: AC
  Administered 2019-07-21: 500 mg via INTRAVENOUS
  Filled 2019-07-21: qty 100

## 2019-07-21 MED ORDER — VANCOMYCIN HCL 10 G IV SOLR
2250.0000 mg | Freq: Once | INTRAVENOUS | Status: AC
  Administered 2019-07-21: 2250 mg via INTRAVENOUS
  Filled 2019-07-21: qty 500

## 2019-07-21 MED ORDER — LACTATED RINGERS IV BOLUS
1000.0000 mL | Freq: Once | INTRAVENOUS | Status: AC
  Administered 2019-07-21: 1000 mL via INTRAVENOUS

## 2019-07-21 MED ORDER — LACTATED RINGERS IV BOLUS
1000.0000 mL | Freq: Once | INTRAVENOUS | Status: AC
  Administered 2019-07-22: 1000 mL via INTRAVENOUS

## 2019-07-21 NOTE — ED Provider Notes (Signed)
[facility] EMERGENCY DEPARTMENT Provider Note   CSN: [id number] Arrival date & time: 07/21/19  2126     History Chief Complaint  Patient presents with  . Hyperglycemia  . Altered Mental Status    Heather Petty is a 69 y.o. female.  69yo F w/ PMH including T2DM, schizophrenia, morbid obesity, HTN, HLD, CVA, Long QT syndrome who p/w AMS.  Patient was brought in by EMS from home where she was reportedly found by her daughter slumped over on her bathtub, unclear how long she had been there.  She was last spoken to 2 days ago.  She is normally conversant and verbal at baseline but has been nonverbal since she was found this evening.  She was noted to be febrile by EMS with CBG 550, hypertensive and tachycardic in route.   LEVEL 5 CAVEAT DUE TO AMS  The history is provided by the EMS personnel.  Hyperglycemia Associated symptoms: altered mental status   Altered Mental Status      Past Medical History:  Diagnosis Date  . AKI (acute kidney injury) ([place]) 12/17/2017  . Arthritis   . Asthma   . Depression   . Diabetes mellitus   . Gout   . High cholesterol   . Hypertension   . Morbid obesity ([place])   . Pneumonia 12/17/2017  . Schizophrenia ([place])   . Stroke [facility])     Patient Active Problem List   Diagnosis Date Noted  . 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 Sepsis ([place]) 07/19/2018  . Ventricular tachycardia, sustained ([place]) 07/19/2018  . NSTEMI (non-ST elevated myocardial infarction) ([place]) 07/19/2018  . Wound infection 04/03/2018  . Morbid obesity with BMI of 50.0-59.9, adult ([place]) 04/03/2018  . Elevated alkaline phosphatase level 04/03/2018  . Asthma exacerbation 03/14/2018  . Hypotension 03/14/2018  . Lactic acidosis 03/14/2018  . GERD (gastroesophageal reflux disease) 03/14/2018  . Acute renal failure superimposed on stage 3 chronic kidney disease ([place]) 03/14/2018  . Macrocytic anemia 03/14/2018  . Abnormal LFTs 03/14/2018  . Acute kidney injury  ([place]) 02/28/2018  . Wound of sacral region 02/28/2018  . Hypothermia 02/28/2018  . Stage II pressure ulcer of sacral region ([place]) 02/15/2018  . DM II (diabetes mellitus, type II), controlled ([place]) 02/09/2018  . Schizophrenia ([place]) 02/09/2018  . Pressure injury of skin 02/06/2018  . Acute renal failure (ARF) ([place]) 02/05/2018  . Hyperlipidemia associated with type 2 diabetes mellitus ([place]) 02/05/2018  . Hyperkalemia 02/05/2018  . Dermatitis 02/05/2018  . Open back wound 02/05/2018  . Dysuria 02/05/2018  . Paranoid schizophrenia ([place])   . CAP (community acquired pneumonia) 12/17/2017  . AKI (acute kidney injury) ([place]) 12/17/2017  . Type II diabetes mellitus with renal manifestations ([place]) 12/17/2017  . HTN (hypertension) 12/17/2017  . Gout     Past Surgical History:  Procedure Laterality Date  . CYST EXCISION    . DENTAL SURGERY    . TONSILLECTOMY       OB History   No obstetric history on file.     Family History  Family history unknown: Yes    Social History   Tobacco Use  . Smoking status: Former Research scientist (life sciences)  . Smokeless tobacco: Never Used  Vaping Use  . Vaping Use: Never used  Substance Use Topics  . Alcohol use: No  . Drug use: No    Home Medications Prior to Admission medications   Medication Sig Start Date End Date Taking? Authorizing Provider  acetaminophen (TYLENOL) 325 MG tablet Take 650  mg by mouth 4 (four) times daily as needed for headache (pain).    [provider]  albuterol (VENTOLIN HFA) 108 (90 Base) MCG/ACT inhaler Inhale 2 puffs into the lungs every 4 (four) hours as needed for wheezing or shortness of breath.    [provider]  allopurinol (ZYLOPRIM) 300 MG tablet Take 300 mg by mouth daily.    [provider]  amLODipine (NORVASC) 10 MG tablet Take 10 mg by mouth daily.    [provider]  antiseptic oral rinse (BIOTENE) LIQD 15 mLs by Mouth Rinse route 3 (three) times daily. Swish and spit    [provider]  Azelastine-Fluticasone (DYMISTA) 137-50 MCG/ACT SUSP Place 1 spray into both nostrils 2 (two) times daily as needed (for allergies).     [provider]  b complex vitamins tablet Take 1 tablet by mouth daily.    [provider]  benazepril (LOTENSIN) 20 MG tablet Take 20 mg by mouth daily.    [provider]  bisacodyl (DULCOLAX) 5 MG EC tablet Take 1 tablet (5 mg total) by mouth daily as needed for moderate constipation. Patient not taking: Reported on 07/19/2018 12/24/17   Lady Deutscher, MD  cetirizine (ZYRTEC) 10 MG tablet Take 10 mg by mouth daily.     [provider]  colchicine 0.6 MG tablet Take 1 tablet (0.6 mg total) by mouth 2 (two) times daily. 04/05/18   Ghimire, Henreitta Leber, MD  collagenase (SANTYL) ointment Apply topically daily. Patient taking differently: Apply 1 application topically See admin instructions. Clean with Dakin's Solution (half strength), apply skin prep periwound. Apply Santyl Ointment to necrotic tissue in wound bed, cover with Calcium Alginate, cover with bordered foam dressing daily. 02/09/18   Elgergawy, Silver Huguenin, MD  Colloidal Oatmeal (EUCERIN ECZEMA RELIEF) 1 % CREA Apply 1 application topically 2 (two) times daily.    [provider]  cyanocobalamin (,VITAMIN B-12,) 1000 MCG/ML injection Inject 1 mL (1,000 mcg total) into the muscle once a week. Patient not taking: Reported on 07/20/2018 02/09/18   Elgergawy, Silver Huguenin, MD  folic acid (FOLVITE) 1 MG tablet Take 1 mg by mouth daily.    [provider]  furosemide (LASIX) 20 MG tablet Take 20 mg by mouth daily.    [provider]  gabapentin (NEURONTIN) 300 MG capsule Take 300 mg by mouth 3 (three) times daily.    [provider]  hydrocortisone 2.5 % cream Apply 1 application topically See admin instructions. Apply topically to rash on face twice daily    [provider]  insulin aspart (NOVOLOG) 100 UNIT/ML injection  Take subcu QA CHS, 140-199 - 2 units, 200-250 - 6 units, 251-299 - 8 units,  300-349 - 10 units,  350 or above 14 units. Patient taking differently: Inject 0-12 Units into the skin See admin instructions. Inject 0-12 units into the skin 4 times a day (before meals and at bedtime) per sliding scale: BGL <60 or >400  = CALL MD; 60-200 = 0 units; 201-250 = 2 units; 251-300 = 4 units; 301-350 = 6 units; 351-400 = 8 units; 401-450 = 10 units; >450 = 12 units 03/05/18   Thurnell Lose, MD  insulin glargine (LANTUS) 100 UNIT/ML injection Inject 0.07 mLs (7 Units total) into the skin at bedtime. 04/12/18   Thurnell Lose, MD  ipratropium-albuterol (DUONEB) 0.5-2.5 (3) MG/3ML SOLN Take 3 mLs by nebulization every 6 (six) hours as needed (for shortness of breath or  wheezing).     [provider]  Multiple Vitamins-Minerals (THERA-M MULTIPLE VITAMINS PO) Take 1 tablet by mouth daily.    [provider]  nadolol (CORGARD) 20 MG tablet Take 1 tablet (20 mg total) by mouth daily. 07/23/18   Oswald Hillock, MD  nystatin (MYCOSTATIN/NYSTOP) powder Apply 1 g topically See admin instructions. Apply topically twice daily as needed for wound care  -  between folds, beneath breasts, bilateral axillae, and under pannus    [provider]  polyethylene glycol (MIRALAX / GLYCOLAX) packet Take 17 g by mouth 2 (two) times daily. Patient taking differently: Take 17 g by mouth daily as needed for mild constipation.  12/24/17   Lady Deutscher, MD  potassium chloride (K-DUR) 10 MEQ tablet Take 1 tablet (10 mEq total) by mouth daily. Take with Lasix. Do not take if you stop lasix. 07/22/18   Oswald Hillock, MD  risperiDONE (RISPERDAL) 0.5 MG tablet Take 1 tablet (0.5 mg total) by mouth 2 (two) times daily. 12/23/17 07/19/18  Purohit, Konrad Dolores, MD  silver sulfADIAZINE (SILVADENE) 1 % cream Apply topically 2 (two) times daily. 02/09/18   Elgergawy, Silver Huguenin, MD  traMADol (ULTRAM) 50 MG tablet Take 50 mg by  mouth 4 (four) times daily as needed (pain).     [provider]  triamcinolone (KENALOG) 0.147 MG/GM topical spray Apply 1 spray topically See admin instructions. Apply topically to head in the direction of hair growth daily at bedtime    [provider]  vitamin C (ASCORBIC ACID) 500 MG tablet Take 500 mg by mouth 2 (two) times daily.    [provider]  Vitamin D, Ergocalciferol, (DRISDOL) 1.25 MG (50000 UT) CAPS capsule Take 50,000 Units by mouth every Monday.     [provider]  Zinc Sulfate (ZINC-220 PO) Take 220 mg by mouth at bedtime.     [provider]    Allergies    Lasix [furosemide]  Review of Systems   Review of Systems  Unable to perform ROS: Mental status change    Physical Exam Updated Vital Signs BP 136/62   Pulse (!) 105   Temp (!) 103.6 F (39.8 C) (Oral)   Resp 20   Ht 5' 1"  (1.549 m)   Wt 110 kg   SpO2 94%   BMI 45.82 kg/m   Physical Exam Vitals and nursing note reviewed.  Constitutional:      Appearance: She is well-developed. She is obese.     Comments: Slumped over to right, ill appearing  HENT:     Head: Normocephalic and atraumatic.     Nose: Rhinorrhea present.     Comments: Copious clear rhinorrhea Eyes:     Conjunctiva/sclera: Conjunctivae normal.     Pupils: Pupils are equal, round, and reactive to light.  Cardiovascular:     Rate and Rhythm: Normal rate and regular rhythm.     Heart sounds: Normal heart sounds. No murmur heard.   Pulmonary:     Effort: Pulmonary effort is normal.     Breath sounds: Normal breath sounds.  Abdominal:     General: Bowel sounds are normal. There is no distension.     Palpations: Abdomen is soft.     Tenderness: There is no abdominal tenderness.  Musculoskeletal:     Cervical back: Neck supple.  Skin:    General: Skin is warm and dry.  Neurological:     Mental Status: She is oriented to person, place, and  time.     Comments: Fluent speech  Psychiatric:         Judgment: Judgment normal.     ED Results / Procedures / Treatments   Labs (all labs ordered are listed, but only abnormal results are displayed) Labs Reviewed  CBC WITH DIFFERENTIAL/PLATELET - Abnormal; Notable for the following components:      Result Value   Neutro Abs 9.5 (*)    Lymphs Abs 0.4 (*)    All other components within normal limits  PROTIME-INR - Abnormal; Notable for the following components:   Prothrombin Time 15.6 (*)    INR 1.3 (*)    All other components within normal limits  URINALYSIS, ROUTINE W REFLEX MICROSCOPIC - Abnormal; Notable for the following components:   Glucose, UA >=500 (*)    Hgb urine dipstick LARGE (*)    Ketones, ur 20 (*)    Protein, ur 100 (*)    Bacteria, UA RARE (*)    All other components within normal limits  CBG MONITORING, ED - Abnormal; Notable for the following components:   Glucose-Capillary 548 (*)    All other components within normal limits  SARS CORONAVIRUS 2 BY RT PCR (HOSPITAL ORDER, [place])  CULTURE, BLOOD (ROUTINE X 2)  CULTURE, BLOOD (ROUTINE X 2)  URINE CULTURE  COMPREHENSIVE METABOLIC PANEL  LACTIC ACID, PLASMA  LACTIC ACID, PLASMA  BLOOD GAS, VENOUS  CK  MAGNESIUM    EKG EKG Interpretation  Date/Time:  Monday July 21 2019 21:53:11 EDT Ventricular Rate:  120 PR Interval:    QRS Duration: 95 QT Interval:  325 QTC Calculation: 460 R Axis:   -25 Text Interpretation: Sinus tachycardia Inferior infarct, old tachycardia new from previous bradycardia QT appears normal Confirmed by Theotis Burrow [phone]) on 07/21/2019 9:55:33 PM   Radiology DG Chest 2 View  Result Date: 07/21/2019 CLINICAL DATA:  Suspected sepsis. EXAM: CHEST - 2 VIEW COMPARISON:  Radiograph 07/19/2018 FINDINGS: Low lung volumes. Mild cardiomegaly. Aortic atherosclerosis. Peribronchial thickening. There are streaky bibasilar opacities. No confluent airspace disease. No significant pleural effusion. No  pneumothorax. No acute osseous abnormalities are seen. IMPRESSION: 1. Low lung volumes with streaky bibasilar opacities, favoring atelectasis. 2. Peribronchial thickening. 3. Mild cardiomegaly. Electronically Signed   By: Keith Rake M.D.   On: 07/21/2019 22:46   CT Head Wo Contrast  Result Date: 07/21/2019 CLINICAL DATA:  Altered mental status. EXAM: CT HEAD WITHOUT CONTRAST TECHNIQUE: Contiguous axial images were obtained from the base of the skull through the vertex without intravenous contrast. COMPARISON:  Head CT 05/07/2018 FINDINGS: Brain: No intracranial hemorrhage, mass effect, or midline shift. Normal brain volume for age. No hydrocephalus. The basilar cisterns are patent. Mild chronic small vessel ischemia. No evidence of territorial infarct or acute ischemia. No extra-axial or intracranial fluid collection. Vascular: Atherosclerosis of skullbase vasculature without hyperdense vessel or abnormal calcification. Skull: No fracture or focal lesion. Sinuses/Orbits: Paranasal sinuses and mastoid air cells are clear. The visualized orbits are unremarkable. Other: None. IMPRESSION: 1. No acute intracranial abnormality. 2. Mild chronic small vessel ischemia. Electronically Signed   By: Keith Rake M.D.   On: 07/21/2019 22:59    Procedures Procedures (including critical care time) CRITICAL CARE Performed by: Wenda Overland Dnya Hickle   Total critical care time: 30 minutes  Critical care time was exclusive of separately billable procedures and treating other patients.  Critical care was necessary to treat or prevent imminent or life-threatening deterioration.  Critical care was time  spent personally by me on the following activities: development of treatment plan with patient and/or surrogate as well as nursing, discussions with consultants, evaluation of patient's response to treatment, examination of patient, obtaining history from patient or surrogate, ordering and performing treatments  and interventions, ordering and review of laboratory studies, ordering and review of radiographic studies, pulse oximetry and re-evaluation of patient's condition.  Medications Ordered in ED Medications  vancomycin (VANCOCIN) 2,250 mg in sodium chloride 0.9 % 500 mL IVPB (2,250 mg Intravenous New Bag/Given 07/21/19 2300)  lactated ringers bolus 1,000 mL (has no administration in time range)  lactated ringers bolus 1,000 mL (0 mLs Intravenous Stopped 07/21/19 2302)  ceFEPIme (MAXIPIME) 2 g in sodium chloride 0.9 % 100 mL IVPB (0 g Intravenous Stopped 07/21/19 2254)  metroNIDAZOLE (FLAGYL) IVPB 500 mg (0 mg Intravenous Stopped 07/21/19 2302)  acetaminophen (TYLENOL) suppository 650 mg (650 mg Rectal Given 07/21/19 2213)    ED Course  I have reviewed the triage vital signs and the nursing notes.  Pertinent labs & imaging results that were available during my care of the patient were reviewed by me and considered in my medical decision making (see chart for details).    MDM Rules/Calculators/A&P                          Patient ill-appearing on arrival, febrile to 103.6, mildly tachycardic, mildly hypertensive.  She was not responding to questions but was awake and protecting airway.  Initiated code sepsis with blood and urine cultures, vancomycin, cefepime, and Flagyl, Tylenol, and IV fluids.  Lab work thus far notable for blood glucose 548, CMP pending, normal CBC, UA with blood and glucose but no obvious infection, COVID-19 negative.  Head CT negative acute.  Chest x-ray with bibasilar opacities suggestive of atelectasis.  Will obtain CT of chest through pelvis to evaluate for source of infection as her severe hyperglycemia would make her susceptible to multiple sources of infection.  Patient signed out pending completion of work-up and admission. Final Clinical Impression(s) / ED Diagnoses Final diagnoses:  None    Rx / DC Orders ED Discharge Orders    None       Shep Porter, Wenda Overland,  MD 07/21/19 2350

## 2019-07-21 NOTE — ED Notes (Signed)
Blood Cultures pulled form IV started by EMS d/t pt being a hard stick. MD made aware and gave orders to start IV antibiotics.

## 2019-07-21 NOTE — ED Triage Notes (Signed)
Pt came in [facility] form home, pt was found by her daughter slumped over the bathtub, do not know how long she has been down. Pt was last heard from x2 days. Pt is usually verbal but not verbal at this time. EMS states CBG was 550, 120HR, 40RR, 28ETCO2, 188/106, 92%RA, 100.4 Ax, temp.  22GL Thumb IV, 21m NS

## 2019-07-21 NOTE — ED Notes (Signed)
°  pt has stage 2 skin breakdown on her sacral area as well as red raw skin in between her legs. Mepalex pad applied to the sacral area.

## 2019-07-22 DIAGNOSIS — G9341 Metabolic encephalopathy: Secondary | ICD-10-CM | POA: Diagnosis present

## 2019-07-22 DIAGNOSIS — Z6841 Body Mass Index (BMI) 40.0 and over, adult: Secondary | ICD-10-CM | POA: Diagnosis not present

## 2019-07-22 DIAGNOSIS — B9562 Methicillin resistant Staphylococcus aureus infection as the cause of diseases classified elsewhere: Secondary | ICD-10-CM | POA: Diagnosis not present

## 2019-07-22 DIAGNOSIS — L98422 Non-pressure chronic ulcer of back with fat layer exposed: Secondary | ICD-10-CM | POA: Diagnosis not present

## 2019-07-22 DIAGNOSIS — N390 Urinary tract infection, site not specified: Secondary | ICD-10-CM | POA: Diagnosis present

## 2019-07-22 DIAGNOSIS — N762 Acute vulvitis: Secondary | ICD-10-CM | POA: Diagnosis present

## 2019-07-22 DIAGNOSIS — Y92012 Bathroom of single-family (private) house as the place of occurrence of the external cause: Secondary | ICD-10-CM | POA: Diagnosis not present

## 2019-07-22 DIAGNOSIS — N179 Acute kidney failure, unspecified: Secondary | ICD-10-CM | POA: Diagnosis present

## 2019-07-22 DIAGNOSIS — I10 Essential (primary) hypertension: Secondary | ICD-10-CM | POA: Diagnosis present

## 2019-07-22 DIAGNOSIS — R7881 Bacteremia: Secondary | ICD-10-CM | POA: Diagnosis present

## 2019-07-22 DIAGNOSIS — E860 Dehydration: Secondary | ICD-10-CM | POA: Diagnosis present

## 2019-07-22 DIAGNOSIS — K8020 Calculus of gallbladder without cholecystitis without obstruction: Secondary | ICD-10-CM | POA: Diagnosis present

## 2019-07-22 DIAGNOSIS — E1169 Type 2 diabetes mellitus with other specified complication: Secondary | ICD-10-CM | POA: Diagnosis present

## 2019-07-22 DIAGNOSIS — F200 Paranoid schizophrenia: Secondary | ICD-10-CM | POA: Diagnosis present

## 2019-07-22 DIAGNOSIS — E785 Hyperlipidemia, unspecified: Secondary | ICD-10-CM | POA: Diagnosis present

## 2019-07-22 DIAGNOSIS — Z91138 Patient's unintentional underdosing of medication regimen for other reason: Secondary | ICD-10-CM | POA: Diagnosis not present

## 2019-07-22 DIAGNOSIS — K5730 Diverticulosis of large intestine without perforation or abscess without bleeding: Secondary | ICD-10-CM | POA: Diagnosis present

## 2019-07-22 DIAGNOSIS — A419 Sepsis, unspecified organism: Secondary | ICD-10-CM

## 2019-07-22 DIAGNOSIS — M6282 Rhabdomyolysis: Secondary | ICD-10-CM | POA: Diagnosis present

## 2019-07-22 DIAGNOSIS — E1110 Type 2 diabetes mellitus with ketoacidosis without coma: Secondary | ICD-10-CM | POA: Diagnosis present

## 2019-07-22 DIAGNOSIS — B952 Enterococcus as the cause of diseases classified elsewhere: Secondary | ICD-10-CM | POA: Diagnosis present

## 2019-07-22 DIAGNOSIS — L89152 Pressure ulcer of sacral region, stage 2: Secondary | ICD-10-CM | POA: Diagnosis present

## 2019-07-22 DIAGNOSIS — T383X6A Underdosing of insulin and oral hypoglycemic [antidiabetic] drugs, initial encounter: Secondary | ICD-10-CM | POA: Diagnosis present

## 2019-07-22 DIAGNOSIS — Z20822 Contact with and (suspected) exposure to covid-19: Secondary | ICD-10-CM | POA: Diagnosis present

## 2019-07-22 DIAGNOSIS — E876 Hypokalemia: Secondary | ICD-10-CM | POA: Diagnosis present

## 2019-07-22 DIAGNOSIS — E1111 Type 2 diabetes mellitus with ketoacidosis with coma: Secondary | ICD-10-CM | POA: Diagnosis not present

## 2019-07-22 DIAGNOSIS — A4102 Sepsis due to Methicillin resistant Staphylococcus aureus: Secondary | ICD-10-CM | POA: Diagnosis present

## 2019-07-22 DIAGNOSIS — L80 Vitiligo: Secondary | ICD-10-CM | POA: Diagnosis present

## 2019-07-22 DIAGNOSIS — R000 Tachycardia, unspecified: Secondary | ICD-10-CM | POA: Diagnosis present

## 2019-07-22 DIAGNOSIS — W19XXXA Unspecified fall, initial encounter: Secondary | ICD-10-CM | POA: Diagnosis present

## 2019-07-22 LAB — CBC
HCT: 35.6 % — ABNORMAL LOW (ref 36.0–46.0)
Hemoglobin: 11.2 g/dL — ABNORMAL LOW (ref 12.0–15.0)
MCH: 29.9 pg (ref 26.0–34.0)
MCHC: 31.5 g/dL (ref 30.0–36.0)
MCV: 94.9 fL (ref 80.0–100.0)
Platelets: 155 10*3/uL (ref 150–400)
RBC: 3.75 MIL/uL — ABNORMAL LOW (ref 3.87–5.11)
RDW: 12.8 % (ref 11.5–15.5)
WBC: 10.7 10*3/uL — ABNORMAL HIGH (ref 4.0–10.5)
nRBC: 0 % (ref 0.0–0.2)

## 2019-07-22 LAB — CBG MONITORING, ED
Glucose-Capillary: 134 mg/dL — ABNORMAL HIGH (ref 70–99)
Glucose-Capillary: 135 mg/dL — ABNORMAL HIGH (ref 70–99)
Glucose-Capillary: 146 mg/dL — ABNORMAL HIGH (ref 70–99)
Glucose-Capillary: 149 mg/dL — ABNORMAL HIGH (ref 70–99)
Glucose-Capillary: 161 mg/dL — ABNORMAL HIGH (ref 70–99)
Glucose-Capillary: 164 mg/dL — ABNORMAL HIGH (ref 70–99)
Glucose-Capillary: 169 mg/dL — ABNORMAL HIGH (ref 70–99)
Glucose-Capillary: 173 mg/dL — ABNORMAL HIGH (ref 70–99)
Glucose-Capillary: 185 mg/dL — ABNORMAL HIGH (ref 70–99)
Glucose-Capillary: 205 mg/dL — ABNORMAL HIGH (ref 70–99)
Glucose-Capillary: 227 mg/dL — ABNORMAL HIGH (ref 70–99)
Glucose-Capillary: 253 mg/dL — ABNORMAL HIGH (ref 70–99)
Glucose-Capillary: 367 mg/dL — ABNORMAL HIGH (ref 70–99)
Glucose-Capillary: 394 mg/dL — ABNORMAL HIGH (ref 70–99)
Glucose-Capillary: 472 mg/dL — ABNORMAL HIGH (ref 70–99)

## 2019-07-22 LAB — BASIC METABOLIC PANEL WITH GFR
Anion gap: 12 (ref 5–15)
BUN: 20 mg/dL (ref 8–23)
CO2: 21 mmol/L — ABNORMAL LOW (ref 22–32)
Calcium: 9.2 mg/dL (ref 8.9–10.3)
Chloride: 107 mmol/L (ref 98–111)
Creatinine, Ser: 0.93 mg/dL (ref 0.44–1.00)
GFR calc Af Amer: >60 mL/min
GFR calc non Af Amer: >60 mL/min
Glucose, Bld: 208 mg/dL — ABNORMAL HIGH (ref 70–99)
Potassium: 4 mmol/L (ref 3.5–5.1)
Sodium: 140 mmol/L (ref 135–145)

## 2019-07-22 LAB — RESPIRATORY PANEL BY PCR

## 2019-07-22 LAB — BLOOD CULTURE ID PANEL (REFLEXED)
Acinetobacter baumannii: NOT DETECTED
Candida albicans: NOT DETECTED
Candida glabrata: NOT DETECTED
Candida krusei: NOT DETECTED
Candida parapsilosis: NOT DETECTED
Candida tropicalis: NOT DETECTED
Enterobacter cloacae complex: NOT DETECTED
Enterobacteriaceae species: NOT DETECTED
Enterococcus species: DETECTED — AB
Escherichia coli: NOT DETECTED
Haemophilus influenzae: NOT DETECTED
Klebsiella oxytoca: NOT DETECTED
Klebsiella pneumoniae: NOT DETECTED
Listeria monocytogenes: NOT DETECTED
Methicillin resistance: DETECTED — AB
Neisseria meningitidis: NOT DETECTED
Proteus species: NOT DETECTED
Pseudomonas aeruginosa: NOT DETECTED
Serratia marcescens: NOT DETECTED
Staphylococcus aureus (BCID): DETECTED — AB
Staphylococcus species: DETECTED — AB
Streptococcus agalactiae: NOT DETECTED
Streptococcus pneumoniae: NOT DETECTED
Streptococcus pyogenes: NOT DETECTED
Streptococcus species: NOT DETECTED
Vancomycin resistance: NOT DETECTED

## 2019-07-22 LAB — HEMOGLOBIN A1C
Hgb A1c MFr Bld: 10.4 % — ABNORMAL HIGH (ref 4.8–5.6)
Mean Plasma Glucose: 251.78 mg/dL

## 2019-07-22 LAB — BASIC METABOLIC PANEL
Anion gap: 10 (ref 5–15)
Anion gap: 11 (ref 5–15)
Anion gap: 10 (ref 5–15)
BUN: 17 mg/dL (ref 8–23)
BUN: 18 mg/dL (ref 8–23)
BUN: 17 mg/dL (ref 8–23)
CO2: 17 mmol/L — ABNORMAL LOW (ref 22–32)
CO2: 20 mmol/L — ABNORMAL LOW (ref 22–32)
CO2: 21 mmol/L — ABNORMAL LOW (ref 22–32)
Calcium: 7.6 mg/dL — ABNORMAL LOW (ref 8.9–10.3)
Calcium: 9 mg/dL (ref 8.9–10.3)
Calcium: 8.9 mg/dL (ref 8.9–10.3)
Chloride: 112 mmol/L — ABNORMAL HIGH (ref 98–111)
Chloride: 108 mmol/L (ref 98–111)
Chloride: 107 mmol/L (ref 98–111)
Creatinine, Ser: 0.93 mg/dL (ref 0.44–1.00)
Creatinine, Ser: 0.94 mg/dL (ref 0.44–1.00)
Creatinine, Ser: 0.88 mg/dL (ref 0.44–1.00)
GFR calc Af Amer: >60 mL/min (ref >60)
GFR calc Af Amer: >60 mL/min (ref >60)
GFR calc Af Amer: >60 mL/min (ref >60)
GFR calc non Af Amer: >60 mL/min (ref >60)
GFR calc non Af Amer: >60 mL/min (ref >60)
GFR calc non Af Amer: >60 mL/min (ref >60)
Glucose, Bld: 243 mg/dL — ABNORMAL HIGH (ref 70–99)
Glucose, Bld: 192 mg/dL — ABNORMAL HIGH (ref 70–99)
Glucose, Bld: 185 mg/dL — ABNORMAL HIGH (ref 70–99)
Potassium: 5.1 mmol/L (ref 3.5–5.1)
Potassium: 3.6 mmol/L (ref 3.5–5.1)
Potassium: 3.7 mmol/L (ref 3.5–5.1)
Sodium: 139 mmol/L (ref 135–145)
Sodium: 139 mmol/L (ref 135–145)
Sodium: 138 mmol/L (ref 135–145)

## 2019-07-22 LAB — PROCALCITONIN: Procalcitonin: 4.89 ng/mL

## 2019-07-22 LAB — CK: Total CK: 1957 U/L — ABNORMAL HIGH (ref 38–234)

## 2019-07-22 LAB — VITAMIN B12: Vitamin B-12: 842 pg/mL (ref 180–914)

## 2019-07-22 LAB — TSH: TSH: 2.997 u[IU]/mL (ref 0.350–4.500)

## 2019-07-22 LAB — HIV ANTIBODY (ROUTINE TESTING W REFLEX): HIV Screen 4th Generation wRfx: NONREACTIVE

## 2019-07-22 LAB — GLUCOSE, CAPILLARY: Glucose-Capillary: 146 mg/dL — ABNORMAL HIGH (ref 70–99)

## 2019-07-22 LAB — AMMONIA: Ammonia: 22 umol/L (ref 9–35)

## 2019-07-22 LAB — BETA-HYDROXYBUTYRIC ACID: Beta-Hydroxybutyric Acid: 1.43 mmol/L — ABNORMAL HIGH (ref 0.05–0.27)

## 2019-07-22 LAB — LACTIC ACID, PLASMA: Lactic Acid, Venous: 1.9 mmol/L (ref 0.5–1.9)

## 2019-07-22 MED ORDER — DEXTROSE-NACL 5-0.45 % IV SOLN: INTRAVENOUS | Status: DC

## 2019-07-22 MED ORDER — ACETAMINOPHEN 325 MG PO TABS
650.0000 mg | ORAL_TABLET | Freq: Four times a day (QID) | ORAL | Status: DC | PRN
  Administered 2019-07-23 – 2019-07-30 (×5): 650 mg via ORAL
  Filled 2019-07-22 (×7): qty 2

## 2019-07-22 MED ORDER — VANCOMYCIN HCL 1250 MG/250ML IV SOLN
1250.0000 mg | INTRAVENOUS | Status: DC
  Administered 2019-07-22 – 2019-07-24 (×2): 1250 mg via INTRAVENOUS
  Filled 2019-07-22 (×2): qty 250

## 2019-07-22 MED ORDER — SODIUM CHLORIDE 0.9 % IV SOLN: INTRAVENOUS | Status: DC

## 2019-07-22 MED ORDER — INSULIN ASPART 100 UNIT/ML ~~LOC~~ SOLN
0.0000 [IU] | Freq: Three times a day (TID) | SUBCUTANEOUS | Status: DC
  Administered 2019-07-22: 3 [IU] via SUBCUTANEOUS
  Administered 2019-07-23: 8 [IU] via SUBCUTANEOUS
  Administered 2019-07-23 (×2): 5 [IU] via SUBCUTANEOUS
  Administered 2019-07-24: 8 [IU] via SUBCUTANEOUS
  Administered 2019-07-24: 5 [IU] via SUBCUTANEOUS
  Administered 2019-07-24 – 2019-07-25 (×2): 11 [IU] via SUBCUTANEOUS
  Administered 2019-07-25 (×2): 5 [IU] via SUBCUTANEOUS
  Administered 2019-07-26 (×2): 2 [IU] via SUBCUTANEOUS
  Administered 2019-07-26 – 2019-07-27 (×2): 3 [IU] via SUBCUTANEOUS
  Administered 2019-07-27: 2 [IU] via SUBCUTANEOUS
  Administered 2019-07-28: 3 [IU] via SUBCUTANEOUS
  Administered 2019-07-28: 2 [IU] via SUBCUTANEOUS
  Administered 2019-07-30: 1 [IU] via SUBCUTANEOUS
  Administered 2019-07-30: 8 [IU] via SUBCUTANEOUS
  Administered 2019-07-31: 3 [IU] via SUBCUTANEOUS
  Administered 2019-07-31: 5 [IU] via SUBCUTANEOUS
  Administered 2019-07-31: 2 [IU] via SUBCUTANEOUS

## 2019-07-22 MED ORDER — NYSTATIN 100000 UNIT/GM EX POWD
Freq: Two times a day (BID) | CUTANEOUS | Status: DC
  Filled 2019-07-22 (×2): qty 15

## 2019-07-22 MED ORDER — POTASSIUM CHLORIDE 10 MEQ/100ML IV SOLN
10.0000 meq | INTRAVENOUS | Status: AC
  Administered 2019-07-22 (×2): 10 meq via INTRAVENOUS
  Filled 2019-07-22 (×2): qty 100

## 2019-07-22 MED ORDER — ACETAMINOPHEN 650 MG RE SUPP
650.0000 mg | Freq: Four times a day (QID) | RECTAL | Status: DC | PRN
  Administered 2019-07-22 (×3): 650 mg via RECTAL
  Filled 2019-07-22 (×3): qty 1

## 2019-07-22 MED ORDER — SODIUM CHLORIDE 0.9 % IV SOLN
2.0000 g | Freq: Two times a day (BID) | INTRAVENOUS | Status: DC
  Administered 2019-07-22: 2 g via INTRAVENOUS
  Filled 2019-07-22: qty 2

## 2019-07-22 MED ORDER — ENOXAPARIN SODIUM 40 MG/0.4ML ~~LOC~~ SOLN
40.0000 mg | SUBCUTANEOUS | Status: DC
  Administered 2019-07-22 – 2019-07-31 (×9): 40 mg via SUBCUTANEOUS
  Filled 2019-07-22 (×9): qty 0.4

## 2019-07-22 MED ORDER — INSULIN ASPART 100 UNIT/ML ~~LOC~~ SOLN
0.0000 [IU] | Freq: Every day | SUBCUTANEOUS | Status: DC
  Administered 2019-07-23 – 2019-07-24 (×2): 3 [IU] via SUBCUTANEOUS
  Administered 2019-07-25: 4 [IU] via SUBCUTANEOUS
  Administered 2019-07-30: 2 [IU] via SUBCUTANEOUS

## 2019-07-22 MED ORDER — INSULIN GLARGINE 100 UNIT/ML ~~LOC~~ SOLN
15.0000 [IU] | SUBCUTANEOUS | Status: DC
  Administered 2019-07-23: 15 [IU] via SUBCUTANEOUS
  Filled 2019-07-22 (×4): qty 0.15

## 2019-07-22 MED ORDER — DEXTROSE 50 % IV SOLN: 0.0000 mL | INTRAVENOUS | Status: DC | PRN

## 2019-07-22 MED ORDER — LACTATED RINGERS IV SOLN: INTRAVENOUS | Status: DC

## 2019-07-22 MED ORDER — INSULIN REGULAR(HUMAN) IN NACL 100-0.9 UT/100ML-% IV SOLN
INTRAVENOUS | Status: DC
  Administered 2019-07-22: 13 [IU]/h via INTRAVENOUS
  Filled 2019-07-22: qty 100

## 2019-07-22 MED ORDER — METRONIDAZOLE IN NACL 5-0.79 MG/ML-% IV SOLN
500.0000 mg | Freq: Three times a day (TID) | INTRAVENOUS | Status: DC
  Administered 2019-07-22 (×2): 500 mg via INTRAVENOUS
  Filled 2019-07-22 (×2): qty 100

## 2019-07-22 NOTE — ED Provider Notes (Signed)
I assumed care of this patient.  Please see previous provider note for further details of Hx, PE.  Briefly patient is a 69 y.o. female who presented altered mental status found to be hyperglycemic and febrile after being found in the bathroom; last seen was 2-days prior by family.  No obvious source of infection other than a sacral wound.  Patient was started on empiric antibiotics. CT head negative Chest x-ray without pneumonia. Covid was negative Labs without leukocytosis or elevated lactic acid. It was consistent for DKA and patient was started on IV fluids as well as insulin drip. UA without evidence of infection  Pending rest of the labs. Added CT chest, abdomen, pelvis  Rest of the work-up revealed elevated CK levels with mild AKI. CT showed evidence of stranding around the sacral/buttock wound. Also noted stranding in the medial thigh/vulvar region on CT scan.  On my physical exam patient has evidence of fungal vulvitis. No gas noted on CT concerning for Fournier's Patient is still altered but alert, following commands, moving all extremities without signs meningeal signs No evidence of acute otitis media or pharyngitis on my exam.  No lymphadenopathy. Added RVP.   We will consult hospitalist team for admission, further work-up and management.  Marland Kitchen1-3 Lead EKG Interpretation Performed by: Fatima Blank, MD Authorized by: Fatima Blank, MD     Interpretation: normal     ECG rate:  88   ECG rate assessment: normal     Rhythm: sinus rhythm     Ectopy: none     Conduction: normal   .Critical Care Performed by: Fatima Blank, MD Authorized by: Fatima Blank, MD     CRITICAL CARE Performed by: Grayce Sessions Dewey Viens Total critical care time: 50 minutes Critical care time was exclusive of separately billable procedures and treating other patients. Critical care was necessary to treat or prevent imminent or life-threatening  deterioration. Critical care was time spent personally by me on the following activities: development of treatment plan with patient and/or surrogate as well as nursing, discussions with consultants, evaluation of patient's response to treatment, examination of patient, obtaining history from patient or surrogate, ordering and performing treatments and interventions, ordering and review of laboratory studies, ordering and review of radiographic studies, pulse oximetry and re-evaluation of patient's condition.     Fatima Blank, MD 07/22/19 [phone]

## 2019-07-22 NOTE — ED Notes (Signed)
Paged MD, Marlowe Sax.

## 2019-07-22 NOTE — Progress Notes (Signed)
PHARMACY - PHYSICIAN COMMUNICATION CRITICAL VALUE ALERT - BLOOD CULTURE IDENTIFICATION (BCID)  Heather Petty is an 69 y.o. female who presented to [facility] on 07/21/2019 with a chief complaint of poorly responsive.  Assessment:  Pt with a hx of VRE back infection who was admitted for sepsis. Broad empiric antibiotics were started with vanc/cefepime/flagyl. Lab called with BCID result of both MRSA and enterococcus. VanA was not detected so ID team has decided to keep her on IV vanc.   Name of physician (or Provider) Contacted: Dr Loleta Books and Megan Salon  Current antibiotics: Vanc/cefepime/flagyl  Changes to prescribed antibiotics recommended:  Keep on vanc alone  Results for orders placed or performed during the hospital encounter of 07/21/19  Blood Culture ID Panel (Reflexed) (Collected: 07/21/2019 10:01 PM)  Result Value Ref Range   Enterococcus species DETECTED (A) NOT DETECTED   Vancomycin resistance NOT DETECTED NOT DETECTED   Listeria monocytogenes NOT DETECTED NOT DETECTED   Staphylococcus species DETECTED (A) NOT DETECTED   Staphylococcus aureus (BCID) DETECTED (A) NOT DETECTED   Methicillin resistance DETECTED (A) NOT DETECTED   Streptococcus species NOT DETECTED NOT DETECTED   Streptococcus agalactiae NOT DETECTED NOT DETECTED   Streptococcus pneumoniae NOT DETECTED NOT DETECTED   Streptococcus pyogenes NOT DETECTED NOT DETECTED   Acinetobacter baumannii NOT DETECTED NOT DETECTED   Enterobacteriaceae species NOT DETECTED NOT DETECTED   Enterobacter cloacae complex NOT DETECTED NOT DETECTED   Escherichia coli NOT DETECTED NOT DETECTED   Klebsiella oxytoca NOT DETECTED NOT DETECTED   Klebsiella pneumoniae NOT DETECTED NOT DETECTED   Proteus species NOT DETECTED NOT DETECTED   Serratia marcescens NOT DETECTED NOT DETECTED   Haemophilus influenzae NOT DETECTED NOT DETECTED   Neisseria meningitidis NOT DETECTED NOT DETECTED   Pseudomonas aeruginosa NOT DETECTED NOT DETECTED    Candida albicans NOT DETECTED NOT DETECTED   Candida glabrata NOT DETECTED NOT DETECTED   Candida krusei NOT DETECTED NOT DETECTED   Candida parapsilosis NOT DETECTED NOT DETECTED   Candida tropicalis NOT DETECTED NOT DETECTED    Alik Mawson 07/22/2019  5:00 PM

## 2019-07-22 NOTE — ED Notes (Signed)
Attempted report x 2, per 2W charge RN has not approved patient d/t rapid response on their unit.

## 2019-07-22 NOTE — Progress Notes (Signed)
PROGRESS NOTE    Heather Petty  YOV:[id number] DOB: [birth year] DOA: 07/21/2019 PCP: [name], MD      Brief Narrative:  Heather Petty is a 69 y.o. F with hx HTN, IDDM, obesity, hx CVA, schizophrenia/depression and asthma who presented with acute AMS.  Rreportedly found by her daughter slumped over in her bathtub, family had last spoken to her 2 days ago.  EMS activated, found CBG 550, tachycardic.  In the ER, temp 103F, tachypneic and tachycardic.  Lactate 3.2, Cr 1.4 from baseline 0.9, CK 695.  CT head, chest abdomen and pelvis notable only for ulceration and fat stranding over left iliac.  Given fluids, IV insulin and empiric antibiotics and admitted to hospitalist service.        Assessment & Plan:  Sepsis Presented with fever, tachycardia, lactic acidemia and encephalopathy/AKI in setting of probably infected sacral ulcer, otherwise source unclear. -Continue vancomycin, flagyl and cefepime  -Follow cultures   DKA Diabetes with polyneuropathy -Hold Lantus -Continue IV insulin per DKA protocol -q4hrs BMP  -Hold gabapentin  Rhabdo Mild, likely too low to be cause of AKI, which is more likely from prerenal injury in sepsis  AKI -Hold ACEi -IV fluids -Trend Cr  Acute metabolic encephalopathy -Hold Risperdal, gabapentin for now  Stage II pressure ulcer, sacrum, POA -Consult WOC  Hypertension -Hold Lasix, nadolol, amlodippine, benazepril  Asthma No active disease, not on maintenance inhalers from preliminary med rec  Schizoaffective disorder -Hold Risperdal for now      Disposition: Status is: Inpatient  Remains inpatient appropriate because:Inpatient level of care appropriate due to severity of illness and persistent metabolic encephaloapthy   Dispo: The patient is from: Home              Anticipated d/c is to: TBD              Anticipated d/c date is: 3 days              Patient currently is not medically stable to  d/c.              MDM: This is a no charge note.  For further details, please see H&P by my partner Dr. Marlowe Sax from earlier today.  The below labs and imaging reports were reviewed and summarized above.    DVT prophylaxis: enoxaparin (LOVENOX) injection 40 mg Start: 07/22/19 1400  Code Status: FULL Family Communication:     Consultants:     Procedures:   CT head  CT chest/abdomen/pelvis  Antimicrobials:   Vancomycin, cefepime, flagyl 6/15 >>   Culture data:   6/14 blood culture -- ngtd   6./14 urine culture -- pending          Subjective: All history from interpreter. Apparentely overnight, patient was febrile and altered at admission, then acetaminophen lowered fever, afebrile for a period and interactive at that time but still sluggish, now more confused, somnolent in last 30 minutes with fever spiking again.  No vomiting.  No seizure. No respiratory distress.        Objective: Vitals:   07/22/19 0414 07/22/19 0518 07/22/19 0620 07/22/19 0643  BP: (!) 133/93 133/60 130/79 (!) 131/51  Pulse: 83 100 (!) 111 (!) 105  Resp: (!) 24 (!) 23 (!) 29 (!) 31  Temp:   (!) 102.9 F (39.4 C)   TempSrc:   Oral   SpO2: 94% 98% 94% 92%  Weight:      Height:        Intake/Output  Summary (Last 24 hours) at 07/22/2019 0749 Last data filed at 07/22/2019 0726 Gross per 24 hour  Intake 3065 ml  Output 700 ml  Net 2365 ml   Filed Weights   07/21/19 2133  Weight: 110 kg    Examination: The patient was seen and examined.      Data Reviewed: I have personally reviewed following labs and imaging studies:  CBC: Recent Labs  Lab 07/21/19 2205 07/22/19 0403  WBC 10.5 10.7*  NEUTROABS 9.5*  --   HGB 13.0 11.2*  HCT 42.1 35.6*  MCV 97.0 94.9  PLT 179 035   Basic Metabolic Panel: Recent Labs  Lab 07/21/19 2205 07/22/19 0238 07/22/19 0403  NA 137 139 140  K 4.4 5.1 4.0  CL 100 112* 107  CO2 17* 17* 21*  GLUCOSE 597* 243* 208*  BUN  20 17 20   CREATININE 1.42* 0.93 0.93  CALCIUM 9.4 7.6* 9.2  MG 1.8  --   --    GFR: Estimated Creatinine Clearance: 64.6 mL/min (by C-G formula based on SCr of 0.93 mg/dL). Liver Function Tests: Recent Labs  Lab 07/21/19 2205  AST 34  ALT 29  ALKPHOS 196*  BILITOT 1.3*  PROT 7.3  ALBUMIN 3.4*   No results for input(s): LIPASE, AMYLASE in the last 168 hours. Recent Labs  Lab 07/22/19 0241  AMMONIA 22   Coagulation Profile: Recent Labs  Lab 07/21/19 2205  INR 1.3*   Cardiac Enzymes: Recent Labs  Lab 07/21/19 2205 07/22/19 0403  CKTOTAL 695* 1,957*   BNP (last 3 results) No results for input(s): PROBNP in the last 8760 hours. HbA1C: Recent Labs    07/22/19 0239  HGBA1C 10.4*   CBG: Recent Labs  Lab 07/22/19 0255 07/22/19 0400 07/22/19 0514 07/22/19 0615 07/22/19 0720  GLUCAP 253* 227* 205* 135* 149*   Lipid Profile: No results for input(s): CHOL, HDL, LDLCALC, TRIG, CHOLHDL, LDLDIRECT in the last 72 hours. Thyroid Function Tests: Recent Labs    07/22/19 0241  TSH 2.997   Anemia Panel: Recent Labs    07/22/19 0238  VITAMINB12 842   Urine analysis:    Component Value Date/Time   COLORURINE YELLOW 07/21/2019 2207   APPEARANCEUR CLEAR 07/21/2019 2207   LABSPEC 1.023 07/21/2019 2207   PHURINE 5.0 07/21/2019 2207   GLUCOSEU >=500 (A) 07/21/2019 2207   HGBUR LARGE (A) 07/21/2019 2207   BILIRUBINUR NEGATIVE 07/21/2019 2207   KETONESUR 20 (A) 07/21/2019 2207   PROTEINUR 100 (A) 07/21/2019 2207   UROBILINOGEN 0.2 12/27/2010 1858   NITRITE NEGATIVE 07/21/2019 2207   LEUKOCYTESUR NEGATIVE 07/21/2019 2207   Sepsis Labs: @LABRCNTIP (procalcitonin:4,lacticacidven:4)  ) Recent Results (from the past 240 hour(s))  SARS Coronavirus 2 by RT PCR (hospital order, performed in [facility] hospital lab) Nasopharyngeal Nasopharyngeal Swab     Status: None   Collection Time: 07/21/19 10:17 PM   Specimen: Nasopharyngeal Swab  Result Value Ref Range  Status   SARS Coronavirus 2 NEGATIVE NEGATIVE Final    Comment: (NOTE) SARS-CoV-2 target nucleic acids are NOT DETECTED.  The SARS-CoV-2 RNA is generally detectable in upper and lower respiratory specimens during the acute phase of infection. The lowest concentration of SARS-CoV-2 viral copies this assay can detect is 250 copies / mL. A negative result does not preclude SARS-CoV-2 infection and should not be used as the sole basis for treatment or other patient management decisions.  A negative result may occur with improper specimen collection / handling, submission of specimen other than nasopharyngeal  swab, presence of viral mutation(s) within the areas targeted by this assay, and inadequate number of viral copies (<250 copies / mL). A negative result must be combined with clinical observations, patient history, and epidemiological information.  Fact Sheet for Patients:   StrictlyIdeas.no  Fact Sheet for Healthcare Providers: BankingDealers.co.za  This test is not yet approved or  cleared by the [place] FDA and has been authorized for detection and/or diagnosis of SARS-CoV-2 by FDA under an Emergency Use Authorization (EUA).  This EUA will remain in effect (meaning this test can be used) for the duration of the COVID-19 declaration under Section 564(b)(1) of the Act, 21 U.S.C. section 360bbb-3(b)(1), unless the authorization is terminated or revoked sooner.  Performed at [facility], [place] [address], [place], [place] [address]          Radiology Studies: CT ABDOMEN PELVIS WO CONTRAST  Addendum Date: 07/22/2019   ADDENDUM REPORT: 07/22/2019 00:43 ADDENDUM: Along the posterior left subcutaneous soft tissues overlying the iliac there is a focal area of ulceration and fat stranding changes. No loculated fluid collections or subcutaneous emphysema. No cortical destruction or bony erosion however seen at the posterior  iliac. Electronically Signed   By: Prudencio Pair M.D.   On: 07/22/2019 00:43   Result Date: 07/22/2019 CLINICAL DATA:  Fever of unknown origin EXAM: CT CHEST, abdomen, and pelvis WITHOUT CONTRAST TECHNIQUE: Multidetector CT imaging of the chest, abdomen and pelvis was performed following the standard protocol without IV contrast. COMPARISON:  July 19, 2018 FINDINGS: Cardiovascular: Scattered aortic atherosclerosis is noted. Coronary artery calcifications are seen. The heart size is normal. There is no pericardial thickening or effusion. Mediastinum/Nodes: There are no enlarged mediastinal, hilar or axillary lymph nodes. The thyroid gland, trachea and esophagus demonstrate no significant findings. Lungs/Pleura: Minimal streaky atelectasis seen at both lung bases. Small amount of tree-in-bud opacity seen at the posterior periphery of the right lung base as on the prior exam. There is a small 5 mm pulmonary nodule at the right lung base which was partially visualized on prior exam. No pleural effusion Upper abdomen: The visualized portion of the upper abdomen is unremarkable. Musculoskeletal/Chest wall: There is no chest wall mass or suspicious osseous finding. 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 Abdomen/pelvis: Hepatobiliary: There is diffuse low density seen throughout the liver parenchyma. No focal hepatic lesion although limited due to the lack of intravenous contrast. Layering hyperdense sludge/small stones are noted. Ill intrahepatic biliary ductal dilatation. Pancreas: Unremarkable.  No surrounding inflammatory changes. Spleen: Normal in size. Although limited due to the lack of intravenous contrast, normal in appearance. Adrenals/Urinary Tract: Both adrenal glands appear normal. The kidneys and collecting system appear normal without evidence of urinary tract calculus or hydronephrosis. Bladder is  unremarkable. Stomach/Bowel: The stomach, small bowel, and colon are normal in appearance. No inflammatory changes or obstructive findings. Diverticulosis without diverticulitis is seen. The appendix is unremarkable. Vascular/Lymphatic: There are no enlarged abdominal or pelvic lymph nodes. Scattered aortic atherosclerotic calcifications are seen without aneurysmal dilatation. Reproductive: The uterus and adnexa are unremarkable. Other: No evidence of abdominal wall mass or hernia. Musculoskeletal: No acute or significant osseous findings. IMPRESSION: 1. No definite acute intrathoracic, abdominal, or pelvic pathology to explain the patient's symptoms. 2. Unchanged minimal tree-in-bud opacities at the posterior periphery of the right lung base, likely due to chronic inflammatory process. 3.  Cholelithiasis without evidence of acute cholecystitis. 4. 5  mm pulmonary nodule at the right lung base. No follow-up recommended. This recommendation follows the consensus statement: Guidelines for Management of Incidental Pulmonary Nodules Detected on CT Images: From the Fleischner Society 2017; Radiology 2017; 284:228-243. 5.  Aortic Atherosclerosis (ICD10-I70.0). Electronically Signed: By: Prudencio Pair M.D. On: 07/22/2019 00:12   DG Chest 2 View  Result Date: 07/21/2019 CLINICAL DATA:  Suspected sepsis. EXAM: CHEST - 2 VIEW COMPARISON:  Radiograph 07/19/2018 FINDINGS: Low lung volumes. Mild cardiomegaly. Aortic atherosclerosis. Peribronchial thickening. There are streaky bibasilar opacities. No confluent airspace disease. No significant pleural effusion. No pneumothorax. No acute osseous abnormalities are seen. IMPRESSION: 1. Low lung volumes with streaky bibasilar opacities, favoring atelectasis. 2. Peribronchial thickening. 3. Mild cardiomegaly. Electronically Signed   By: Keith Rake M.D.   On: 07/21/2019 22:46   CT Head Wo Contrast  Result Date: 07/21/2019 CLINICAL DATA:  Altered mental status. EXAM: CT HEAD  WITHOUT CONTRAST TECHNIQUE: Contiguous axial images were obtained from the base of the skull through the vertex without intravenous contrast. COMPARISON:  Head CT 05/07/2018 FINDINGS: Brain: No intracranial hemorrhage, mass effect, or midline shift. Normal brain volume for age. No hydrocephalus. The basilar cisterns are patent. Mild chronic small vessel ischemia. No evidence of territorial infarct or acute ischemia. No extra-axial or intracranial fluid collection. Vascular: Atherosclerosis of skullbase vasculature without hyperdense vessel or abnormal calcification. Skull: No fracture or focal lesion. Sinuses/Orbits: Paranasal sinuses and mastoid air cells are clear. The visualized orbits are unremarkable. Other: None. IMPRESSION: 1. No acute intracranial abnormality. 2. Mild chronic small vessel ischemia. Electronically Signed   By: Keith Rake M.D.   On: 07/21/2019 22:59   CT Chest Wo Contrast  Addendum Date: 07/22/2019   ADDENDUM REPORT: 07/22/2019 00:43 ADDENDUM: Along the posterior left subcutaneous soft tissues overlying the iliac there is a focal area of ulceration and fat stranding changes. No loculated fluid collections or subcutaneous emphysema. No cortical destruction or bony erosion however seen at the posterior iliac. Electronically Signed   By: Prudencio Pair M.D.   On: 07/22/2019 00:43   Result Date: 07/22/2019 CLINICAL DATA:  Fever of unknown origin EXAM: CT CHEST, abdomen, and pelvis WITHOUT CONTRAST TECHNIQUE: Multidetector CT imaging of the chest, abdomen and pelvis was performed following the standard protocol without IV contrast. COMPARISON:  July 19, 2018 FINDINGS: Cardiovascular: Scattered aortic atherosclerosis is noted. Coronary artery calcifications are seen. The heart size is normal. There is no pericardial thickening or effusion. Mediastinum/Nodes: There are no enlarged mediastinal, hilar or axillary lymph nodes. The thyroid gland, trachea and esophagus demonstrate no significant  findings. Lungs/Pleura: Minimal streaky atelectasis seen at both lung bases. Small amount of tree-in-bud opacity seen at the posterior periphery of the right lung base as on the prior exam. There is a small 5 mm pulmonary nodule at the right lung base which was partially visualized on prior exam. No pleural effusion Upper abdomen: The visualized portion of the upper abdomen is unremarkable. Musculoskeletal/Chest wall: There is no chest wall mass or suspicious osseous finding. 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 Abdomen/pelvis: Hepatobiliary: There is diffuse low density seen throughout the liver parenchyma. No focal hepatic lesion although limited due to the lack of intravenous contrast. Layering hyperdense sludge/small stones are noted. Ill intrahepatic biliary ductal dilatation. Pancreas: Unremarkable.  No surrounding inflammatory changes. Spleen: Normal in size. Although limited due to the lack of intravenous contrast, normal in appearance. Adrenals/Urinary Tract: Both  adrenal glands appear normal. The kidneys and collecting system appear normal without evidence of urinary tract calculus or hydronephrosis. Bladder is unremarkable. Stomach/Bowel: The stomach, small bowel, and colon are normal in appearance. No inflammatory changes or obstructive findings. Diverticulosis without diverticulitis is seen. The appendix is unremarkable. Vascular/Lymphatic: There are no enlarged abdominal or pelvic lymph nodes. Scattered aortic atherosclerotic calcifications are seen without aneurysmal dilatation. Reproductive: The uterus and adnexa are unremarkable. Other: No evidence of abdominal wall mass or hernia. Musculoskeletal: No acute or significant osseous findings. IMPRESSION: 1. No definite acute intrathoracic, abdominal, or pelvic pathology to explain the patient's symptoms. 2. Unchanged minimal tree-in-bud opacities at  the posterior periphery of the right lung base, likely due to chronic inflammatory process. 3.  Cholelithiasis without evidence of acute cholecystitis. 4. 5 mm pulmonary nodule at the right lung base. No follow-up recommended. This recommendation follows the consensus statement: Guidelines for Management of Incidental Pulmonary Nodules Detected on CT Images: From the Fleischner Society 2017; Radiology 2017; 284:228-243. 5.  Aortic Atherosclerosis (ICD10-I70.0). Electronically Signed: By: Prudencio Pair M.D. On: 07/22/2019 00:12        Scheduled Meds: . enoxaparin (LOVENOX) injection  40 mg Subcutaneous Q24H  . nystatin   Topical BID   Continuous Infusions: . sodium chloride Stopped (07/22/19 0415)  . sodium chloride Stopped (07/22/19 0726)  . ceFEPime (MAXIPIME) IV    . dextrose 5 % and 0.45% NaCl 75 mL/hr at 07/22/19 0416  . insulin 2.2 Units/hr (07/22/19 0725)  . metronidazole Stopped (07/22/19 0603)  . [START ON 07/23/2019] vancomycin       LOS: 0 days    Time spent: 20 minutes    Edwin Dada, MD [facility] 07/22/2019, 7:49 AM     Please page though [facility] or Epic secure chat:  For password, contact charge nurse

## 2019-07-22 NOTE — Progress Notes (Signed)
Pharmacy Antibiotic Note  Heather Petty is a 69 y.o. female admitted on 07/21/2019 with sepsis.  Pharmacy has been consulted for vancomycin and cefepime dosing.  Plan: Vancomycin 2281m given in the ED then 12562mIV every 24 hours.  Goal trough 15-20 mcg/mL.  Cefepime 2g IV every 12 hours.  Height: 5' 1"  (154.9 cm) Weight: 110 kg (242 lb 8.1 oz) IBW/kg (Calculated) : 47.8  Temp (24hrs), Avg:101.2 F (38.4 C), Min:98.7 F (37.1 C), Max:103.6 F (39.8 C)  Recent Labs  Lab 07/21/19 2205 07/22/19 0009  WBC 10.5  --   CREATININE 1.42*  --   LATICACIDVEN 3.2* 1.9    Estimated Creatinine Clearance: 42.3 mL/min (A) (by C-G formula based on SCr of 1.42 mg/dL (H)).    Allergies  Allergen Reactions  . Lasix [Furosemide] Other (See Comments)    IV-LASIX ==> Reaction: Burning    Thank you for allowing pharmacy to be a part of this patient's care.  VeWynona NeatPharmD, BCPS  07/22/2019 2:49 AM

## 2019-07-22 NOTE — Consult Note (Signed)
WOC Nurse Consult Note: Consult requested for sacrum/buttocks Wound type: Sacrum with stage 2 pressure injury; 2X.2X.2cm, pink and moist Bilat buttocks are red and macerated with scattered areas of red moist partial thickness skin loss.  Appearance is consistent with moisture associated skin damage. Affected area is approx 5X5cm and involves bilat buttocks/sacrum. Pressure Injury POA: Yes Dressing procedure/placement/frequency: Topical treatment orders provided for bedside nurses to perform as follows to protect and promote healing: Foam dressing to buttock,s/sacrum change Q 3 days or PRN soiling. If patient is frequently incontinent, then leave foam dressing off and apply barrier cream with each turning and cleaning episode. Please re-consult if further assistance is needed.  Thank-you,  Julien Girt MSN, [place], [place], [place], [place]

## 2019-07-22 NOTE — H&P (Addendum)
History and Physical    Heather Petty NLG:[id number] DOB: 13-May-1950 DOA: 07/21/2019  PCP: [name], MD Patient coming from: Home  Chief Complaint: Hyperglycemia, altered mental status  HPI: Heather Petty is a 69 y.o. female with medical history significant of asthma, insulin-dependent diabetes, hypertension, hyperlipidemia, morbid obesity (BMI 45.82), CVA, depression, schizophrenia presenting to the ED via EMS for evaluation of hyperglycemia and altered mental status.  She was reportedly found by her daughter slumped over on her bathtub and it is unclear how long she has been there.  Family had last spoken to her 2 days ago.  She is normally conversant and verbal at baseline but has been nonverbal since she was found this evening.  EMS noted CBG of 550, hypertensive and tachycardic in route.  Spanish interpreter services used.  Patient is somnolent but wakes up and moves all extremities on command.  Not answering any questions.  ED Course: Temperature 103.6 F.  Tachycardic and tachypneic.  Slightly hypertensive on arrival.  WBC count 10.5.  Lactic acid 3.2 >1.9 with fluid boluses.  Blood culture x2 pending.  Blood glucose 597.  Bicarb 17, anion gap 20.  Creatinine 1.4, baseline 0.9.  CK 695.  UA not suggestive of infection.  Urine culture pending.  SARS-CoV-2 PCR test negative.  RVP pending.  Head CT negative for acute intracranial abnormality. CT chest/abdomen/pelvis showing a focal area of ulceration and fat stranding changes along the posterior left subcutaneous soft tissues overlying the iliac.  No loculated fluid collections or subcutaneous emphysema.  No evidence of osteomyelitis.  No other acute source of infection.  Patient was given broad-spectrum antibiotics including vancomycin, cefepime, and metronidazole.  Given potassium supplementation, 2 L LR boluses, and started on IV insulin.  Review of Systems:  All systems reviewed and apart from history of presenting illness,  are negative.  Past Medical History:  Diagnosis Date  . AKI (acute kidney injury) ([place]) 12/17/2017  . Arthritis   . Asthma   . Depression   . Diabetes mellitus   . Gout   . High cholesterol   . Hypertension   . Morbid obesity ([place])   . Pneumonia 12/17/2017  . Schizophrenia ([place])   . Stroke [facility])     Past Surgical History:  Procedure Laterality Date  . CYST EXCISION    . DENTAL SURGERY    . TONSILLECTOMY       reports that she has quit smoking. She has never used smokeless tobacco. She reports that she does not drink alcohol and does not use drugs.  Allergies  Allergen Reactions  . Lasix [Furosemide] Other (See Comments)    IV-LASIX ==> Reaction: Burning    Family history: Unable to obtain at this time given patient's altered mental status.  Prior to Admission medications   Medication Sig Start Date End Date Taking? Authorizing Provider  acetaminophen (TYLENOL) 325 MG tablet Take 650 mg by mouth 4 (four) times daily as needed for headache (pain).    [provider]  albuterol (VENTOLIN HFA) 108 (90 Base) MCG/ACT inhaler Inhale 2 puffs into the lungs every 4 (four) hours as needed for wheezing or shortness of breath.    [provider]  allopurinol (ZYLOPRIM) 300 MG tablet Take 300 mg by mouth daily.    [provider]  amLODipine (NORVASC) 10 MG tablet Take 10 mg by mouth daily.    [provider]  antiseptic oral rinse (BIOTENE) LIQD 15 mLs by Mouth Rinse route 3 (three) times daily. Swish and  spit    [provider]  Azelastine-Fluticasone (DYMISTA) 137-50 MCG/ACT SUSP Place 1 spray into both nostrils 2 (two) times daily as needed (for allergies).     [provider]  b complex vitamins tablet Take 1 tablet by mouth daily.    [provider]  benazepril (LOTENSIN) 20 MG tablet Take 20 mg by mouth daily.    [provider]  bisacodyl (DULCOLAX) 5 MG EC tablet Take 1 tablet (5 mg total) by mouth daily  as needed for moderate constipation. Patient not taking: Reported on 07/19/2018 12/24/17   Lady Deutscher, MD  cetirizine (ZYRTEC) 10 MG tablet Take 10 mg by mouth daily.     [provider]  colchicine 0.6 MG tablet Take 1 tablet (0.6 mg total) by mouth 2 (two) times daily. 04/05/18   Ghimire, Henreitta Leber, MD  collagenase (SANTYL) ointment Apply topically daily. Patient taking differently: Apply 1 application topically See admin instructions. Clean with Dakin's Solution (half strength), apply skin prep periwound. Apply Santyl Ointment to necrotic tissue in wound bed, cover with Calcium Alginate, cover with bordered foam dressing daily. 02/09/18   Elgergawy, Silver Huguenin, MD  Colloidal Oatmeal (EUCERIN ECZEMA RELIEF) 1 % CREA Apply 1 application topically 2 (two) times daily.    [provider]  cyanocobalamin (,VITAMIN B-12,) 1000 MCG/ML injection Inject 1 mL (1,000 mcg total) into the muscle once a week. Patient not taking: Reported on 07/20/2018 02/09/18   Elgergawy, Silver Huguenin, MD  folic acid (FOLVITE) 1 MG tablet Take 1 mg by mouth daily.    [provider]  furosemide (LASIX) 20 MG tablet Take 20 mg by mouth daily.    [provider]  gabapentin (NEURONTIN) 300 MG capsule Take 300 mg by mouth 3 (three) times daily.    [provider]  hydrocortisone 2.5 % cream Apply 1 application topically See admin instructions. Apply topically to rash on face twice daily    [provider]  insulin aspart (NOVOLOG) 100 UNIT/ML injection Take subcu QA CHS, 140-199 - 2 units, 200-250 - 6 units, 251-299 - 8 units,  300-349 - 10 units,  350 or above 14 units. Patient taking differently: Inject 0-12 Units into the skin See admin instructions. Inject 0-12 units into the skin 4 times a day (before meals and at bedtime) per sliding scale: BGL <60 or >400  = CALL MD; 60-200 = 0 units; 201-250 = 2 units; 251-300 = 4 units; 301-350 = 6 units; 351-400 = 8 units; 401-450 = 10  units; >450 = 12 units 03/05/18   Thurnell Lose, MD  insulin glargine (LANTUS) 100 UNIT/ML injection Inject 0.07 mLs (7 Units total) into the skin at bedtime. 04/12/18   Thurnell Lose, MD  ipratropium-albuterol (DUONEB) 0.5-2.5 (3) MG/3ML SOLN Take 3 mLs by nebulization every 6 (six) hours as needed (for shortness of breath or wheezing).     [provider]  Multiple Vitamins-Minerals (THERA-M MULTIPLE VITAMINS PO) Take 1 tablet by mouth daily.    [provider]  nadolol (CORGARD) 20 MG tablet Take 1 tablet (20 mg total) by mouth daily. 07/23/18   Oswald Hillock, MD  nystatin (MYCOSTATIN/NYSTOP) powder Apply 1 g topically See admin instructions. Apply topically twice daily as needed for wound care  -  between folds, beneath breasts, bilateral axillae, and under pannus    [provider]  polyethylene glycol (MIRALAX / GLYCOLAX) packet Take 17 g by mouth 2 (two) times daily. Patient taking  differently: Take 17 g by mouth daily as needed for mild constipation.  12/24/17   Lady Deutscher, MD  potassium chloride (K-DUR) 10 MEQ tablet Take 1 tablet (10 mEq total) by mouth daily. Take with Lasix. Do not take if you stop lasix. 07/22/18   Oswald Hillock, MD  risperiDONE (RISPERDAL) 0.5 MG tablet Take 1 tablet (0.5 mg total) by mouth 2 (two) times daily. 12/23/17 07/19/18  Purohit, Konrad Dolores, MD  silver sulfADIAZINE (SILVADENE) 1 % cream Apply topically 2 (two) times daily. 02/09/18   Elgergawy, Silver Huguenin, MD  traMADol (ULTRAM) 50 MG tablet Take 50 mg by mouth 4 (four) times daily as needed (pain).     [provider]  triamcinolone (KENALOG) 0.147 MG/GM topical spray Apply 1 spray topically See admin instructions. Apply topically to head in the direction of hair growth daily at bedtime    [provider]  vitamin C (ASCORBIC ACID) 500 MG tablet Take 500 mg by mouth 2 (two) times daily.    [provider]  Vitamin D, Ergocalciferol, (DRISDOL) 1.25 MG (50000  UT) CAPS capsule Take 50,000 Units by mouth every Monday.     [provider]  Zinc Sulfate (ZINC-220 PO) Take 220 mg by mouth at bedtime.     [provider]    Physical Exam: Vitals:   07/21/19 2330 07/21/19 2345 07/22/19 0037 07/22/19 0215  BP: 136/62  (!) 123/58 140/62  Pulse:   92 90  Resp: (!) 23 20 (!) 23 18  Temp:   98.7 F (37.1 C)   TempSrc:   Oral   SpO2: 94%  93% 97%  Weight:      Height:        Physical Exam Constitutional:      Appearance: She is obese. She is ill-appearing. She is not diaphoretic.  HENT:     Head: Normocephalic.     Mouth/Throat:     Mouth: Mucous membranes are dry.  Eyes:     General:        Right eye: No discharge.        Left eye: No discharge.  Cardiovascular:     Rate and Rhythm: Normal rate and regular rhythm.     Pulses: Normal pulses.  Pulmonary:     Effort: Pulmonary effort is normal. No respiratory distress.     Breath sounds: No wheezing or rales.  Abdominal:     General: Bowel sounds are normal. There is no distension.     Palpations: Abdomen is soft.     Tenderness: There is no abdominal tenderness. There is no guarding.  Musculoskeletal:        General: No swelling.     Cervical back: Neck supple. No rigidity.  Skin:    General: Skin is warm and dry.     Comments: Stage II sacral pressure ulcer Intertrigo in the groin region  Neurological:     Mental Status: She is disoriented.     Comments: Somnolent but waking up on command and moving all extremities Not answering any questions     Labs on Admission: I have personally reviewed following labs and imaging studies  CBC: Recent Labs  Lab 07/21/19 2205  WBC 10.5  NEUTROABS 9.5*  HGB 13.0  HCT 42.1  MCV 97.0  PLT 790   Basic Metabolic Panel: Recent Labs  Lab 07/21/19 2205  NA 137  K 4.4  CL 100  CO2 17*  GLUCOSE 597*  BUN 20  CREATININE  1.42*  CALCIUM 9.4  MG 1.8   GFR: Estimated Creatinine Clearance: 42.3 mL/min (A) (by C-G  formula based on SCr of 1.42 mg/dL (H)). Liver Function Tests: Recent Labs  Lab 07/21/19 2205  AST 34  ALT 29  ALKPHOS 196*  BILITOT 1.3*  PROT 7.3  ALBUMIN 3.4*   No results for input(s): LIPASE, AMYLASE in the last 168 hours. No results for input(s): AMMONIA in the last 168 hours. Coagulation Profile: Recent Labs  Lab 07/21/19 2205  INR 1.3*   Cardiac Enzymes: Recent Labs  Lab 07/21/19 2205  CKTOTAL 695*   BNP (last 3 results) No results for input(s): PROBNP in the last 8760 hours. HbA1C: No results for input(s): HGBA1C in the last 72 hours. CBG: Recent Labs  Lab 07/21/19 2152 07/22/19 0020 07/22/19 0100 07/22/19 0203  GLUCAP 548* 472* 394* 367*   Lipid Profile: No results for input(s): CHOL, HDL, LDLCALC, TRIG, CHOLHDL, LDLDIRECT in the last 72 hours. Thyroid Function Tests: No results for input(s): TSH, T4TOTAL, FREET4, T3FREE, THYROIDAB in the last 72 hours. Anemia Panel: No results for input(s): VITAMINB12, FOLATE, FERRITIN, TIBC, IRON, RETICCTPCT in the last 72 hours. Urine analysis:    Component Value Date/Time   COLORURINE YELLOW 07/21/2019 2207   APPEARANCEUR CLEAR 07/21/2019 2207   LABSPEC 1.023 07/21/2019 2207   PHURINE 5.0 07/21/2019 2207   GLUCOSEU >=500 (A) 07/21/2019 2207   HGBUR LARGE (A) 07/21/2019 2207   BILIRUBINUR NEGATIVE 07/21/2019 2207   KETONESUR 20 (A) 07/21/2019 2207   PROTEINUR 100 (A) 07/21/2019 2207   UROBILINOGEN 0.2 12/27/2010 1858   NITRITE NEGATIVE 07/21/2019 2207   LEUKOCYTESUR NEGATIVE 07/21/2019 2207    Radiological Exams on Admission: CT ABDOMEN PELVIS WO CONTRAST  Addendum Date: 07/22/2019   ADDENDUM REPORT: 07/22/2019 00:43 ADDENDUM: Along the posterior left subcutaneous soft tissues overlying the iliac there is a focal area of ulceration and fat stranding changes. No loculated fluid collections or subcutaneous emphysema. No cortical destruction or bony erosion however seen at the posterior iliac. Electronically  Signed   By: Prudencio Pair M.D.   On: 07/22/2019 00:43   Result Date: 07/22/2019 CLINICAL DATA:  Fever of unknown origin EXAM: CT CHEST, abdomen, and pelvis WITHOUT CONTRAST TECHNIQUE: Multidetector CT imaging of the chest, abdomen and pelvis was performed following the standard protocol without IV contrast. COMPARISON:  July 19, 2018 FINDINGS: Cardiovascular: Scattered aortic atherosclerosis is noted. Coronary artery calcifications are seen. The heart size is normal. There is no pericardial thickening or effusion. Mediastinum/Nodes: There are no enlarged mediastinal, hilar or axillary lymph nodes. The thyroid gland, trachea and esophagus demonstrate no significant findings. Lungs/Pleura: Minimal streaky atelectasis seen at both lung bases. Small amount of tree-in-bud opacity seen at the posterior periphery of the right lung base as on the prior exam. There is a small 5 mm pulmonary nodule at the right lung base which was partially visualized on prior exam. No pleural effusion Upper abdomen: The visualized portion of the upper abdomen is unremarkable. Musculoskeletal/Chest wall: There is no chest wall mass or suspicious osseous finding. 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 Abdomen/pelvis: Hepatobiliary: There is diffuse low density seen throughout the liver parenchyma. No focal hepatic lesion although limited due to the lack of intravenous contrast. Layering hyperdense sludge/small stones are noted. Ill intrahepatic biliary ductal dilatation. Pancreas: Unremarkable.  No surrounding inflammatory changes. Spleen: Normal in size. Although limited due to the lack of  intravenous contrast, normal in appearance. Adrenals/Urinary Tract: Both adrenal glands appear normal. The kidneys and collecting system appear normal without evidence of urinary tract calculus or hydronephrosis. Bladder is unremarkable. Stomach/Bowel: The  stomach, small bowel, and colon are normal in appearance. No inflammatory changes or obstructive findings. Diverticulosis without diverticulitis is seen. The appendix is unremarkable. Vascular/Lymphatic: There are no enlarged abdominal or pelvic lymph nodes. Scattered aortic atherosclerotic calcifications are seen without aneurysmal dilatation. Reproductive: The uterus and adnexa are unremarkable. Other: No evidence of abdominal wall mass or hernia. Musculoskeletal: No acute or significant osseous findings. IMPRESSION: 1. No definite acute intrathoracic, abdominal, or pelvic pathology to explain the patient's symptoms. 2. Unchanged minimal tree-in-bud opacities at the posterior periphery of the right lung base, likely due to chronic inflammatory process. 3.  Cholelithiasis without evidence of acute cholecystitis. 4. 5 mm pulmonary nodule at the right lung base. No follow-up recommended. This recommendation follows the consensus statement: Guidelines for Management of Incidental Pulmonary Nodules Detected on CT Images: From the Fleischner Society 2017; Radiology 2017; 284:228-243. 5.  Aortic Atherosclerosis (ICD10-I70.0). Electronically Signed: By: Prudencio Pair M.D. On: 07/22/2019 00:12   DG Chest 2 View  Result Date: 07/21/2019 CLINICAL DATA:  Suspected sepsis. EXAM: CHEST - 2 VIEW COMPARISON:  Radiograph 07/19/2018 FINDINGS: Low lung volumes. Mild cardiomegaly. Aortic atherosclerosis. Peribronchial thickening. There are streaky bibasilar opacities. No confluent airspace disease. No significant pleural effusion. No pneumothorax. No acute osseous abnormalities are seen. IMPRESSION: 1. Low lung volumes with streaky bibasilar opacities, favoring atelectasis. 2. Peribronchial thickening. 3. Mild cardiomegaly. Electronically Signed   By: Keith Rake M.D.   On: 07/21/2019 22:46   CT Head Wo Contrast  Result Date: 07/21/2019 CLINICAL DATA:  Altered mental status. EXAM: CT HEAD WITHOUT CONTRAST TECHNIQUE:  Contiguous axial images were obtained from the base of the skull through the vertex without intravenous contrast. COMPARISON:  Head CT 05/07/2018 FINDINGS: Brain: No intracranial hemorrhage, mass effect, or midline shift. Normal brain volume for age. No hydrocephalus. The basilar cisterns are patent. Mild chronic small vessel ischemia. No evidence of territorial infarct or acute ischemia. No extra-axial or intracranial fluid collection. Vascular: Atherosclerosis of skullbase vasculature without hyperdense vessel or abnormal calcification. Skull: No fracture or focal lesion. Sinuses/Orbits: Paranasal sinuses and mastoid air cells are clear. The visualized orbits are unremarkable. Other: None. IMPRESSION: 1. No acute intracranial abnormality. 2. Mild chronic small vessel ischemia. Electronically Signed   By: Keith Rake M.D.   On: 07/21/2019 22:59   CT Chest Wo Contrast  Addendum Date: 07/22/2019   ADDENDUM REPORT: 07/22/2019 00:43 ADDENDUM: Along the posterior left subcutaneous soft tissues overlying the iliac there is a focal area of ulceration and fat stranding changes. No loculated fluid collections or subcutaneous emphysema. No cortical destruction or bony erosion however seen at the posterior iliac. Electronically Signed   By: Prudencio Pair M.D.   On: 07/22/2019 00:43   Result Date: 07/22/2019 CLINICAL DATA:  Fever of unknown origin EXAM: CT CHEST, abdomen, and pelvis WITHOUT CONTRAST TECHNIQUE: Multidetector CT imaging of the chest, abdomen and pelvis was performed following the standard protocol without IV contrast. COMPARISON:  July 19, 2018 FINDINGS: Cardiovascular: Scattered aortic atherosclerosis is noted. Coronary artery calcifications are seen. The heart size is normal. There is no pericardial thickening or effusion. Mediastinum/Nodes: There are no enlarged mediastinal, hilar or axillary lymph nodes. The thyroid gland, trachea and esophagus demonstrate no significant findings. Lungs/Pleura:  Minimal streaky atelectasis seen at both lung bases. Small amount of tree-in-bud  opacity seen at the posterior periphery of the right lung base as on the prior exam. There is a small 5 mm pulmonary nodule at the right lung base which was partially visualized on prior exam. No pleural effusion Upper abdomen: The visualized portion of the upper abdomen is unremarkable. Musculoskeletal/Chest wall: There is no chest wall mass or suspicious osseous finding. 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 Abdomen/pelvis: Hepatobiliary: There is diffuse low density seen throughout the liver parenchyma. No focal hepatic lesion although limited due to the lack of intravenous contrast. Layering hyperdense sludge/small stones are noted. Ill intrahepatic biliary ductal dilatation. Pancreas: Unremarkable.  No surrounding inflammatory changes. Spleen: Normal in size. Although limited due to the lack of intravenous contrast, normal in appearance. Adrenals/Urinary Tract: Both adrenal glands appear normal. The kidneys and collecting system appear normal without evidence of urinary tract calculus or hydronephrosis. Bladder is unremarkable. Stomach/Bowel: The stomach, small bowel, and colon are normal in appearance. No inflammatory changes or obstructive findings. Diverticulosis without diverticulitis is seen. The appendix is unremarkable. Vascular/Lymphatic: There are no enlarged abdominal or pelvic lymph nodes. Scattered aortic atherosclerotic calcifications are seen without aneurysmal dilatation. Reproductive: The uterus and adnexa are unremarkable. Other: No evidence of abdominal wall mass or hernia. Musculoskeletal: No acute or significant osseous findings. IMPRESSION: 1. No definite acute intrathoracic, abdominal, or pelvic pathology to explain the patient's symptoms. 2. Unchanged minimal tree-in-bud opacities at the posterior periphery of  the right lung base, likely due to chronic inflammatory process. 3.  Cholelithiasis without evidence of acute cholecystitis. 4. 5 mm pulmonary nodule at the right lung base. No follow-up recommended. This recommendation follows the consensus statement: Guidelines for Management of Incidental Pulmonary Nodules Detected on CT Images: From the Fleischner Society 2017; Radiology 2017; 284:228-243. 5.  Aortic Atherosclerosis (ICD10-I70.0). Electronically Signed: By: Prudencio Pair M.D. On: 07/22/2019 00:12    EKG: Independently reviewed.  Sinus tachycardia, rate increased since prior tracing.  Assessment/Plan Principal Problem:   Sepsis ([place]) Active Problems:   Pressure ulcer   DKA (diabetic ketoacidoses) (HCC)   Rhabdomyolysis   Acute metabolic encephalopathy   Sepsis from unknown source: Febrile, tachycardic, and tachypneic.  Labs showing mild leukocytosis with WBC count 10.5.  Lactic acid 3.2 >1.9 with fluid boluses. CT chest/abdomen/pelvis showing sacral/buttock pressure ulcer without signs of osteomyelitis, loculated fluid collection, or emphysema.  No other obvious source of infection.  UA not suggestive of infection.  SARS-CoV-2 PCR test negative.  No meningeal signs on exam. -Continue broad-spectrum antibiotics including vancomycin, cefepime, and metronidazole.  Tylenol as needed for fevers.  Continue IV fluid hydration.  Check procalcitonin level.  Blood culture x2 pending.  Urine culture pending.  DKA: Likely precipitating factor is underlying infection.  Does have history of insulin-dependent diabetes and unclear if she has been taking her insulin.  Blood glucose 597 on arrival.  Bicarb 17, anion gap 20. -Continue IV insulin per DKA protocol.  IV fluid hydration with normal saline, when CBG less than 250 switch to D5-1/2 NS.  CBG checks every 1 hour.  BMP every 4 hours.  Check A1c.  When DKA resolves, initiate subcutaneous insulin and diet.  Continue IV insulin for an additional 1 to 2  hours. Addendum: Also check beta hydroxybutyric acid level.  Check VBG for pH.  Mild rhabdomyolysis: Patient was found down on the floor and last seen normal 2 days ago.  CK 695. -IV fluid hydration and  continue to trend CK  AKI: Likely due to dehydration and rhabdomyolysis.  Creatinine 1.4, baseline 0.9. -IV fluid hydration.  Monitor renal function and urine output.  Avoid nephrotoxic agents.  Acute metabolic encephalopathy: Likely multifactorial due to sepsis/underlying infection, DKA, and dehydration/AKI.  Head CT negative for acute intracranial abnormality.  No focal neuro deficit. -Management of conditions listed above and continue to monitor mental status.  Check TSH, B12, and ammonia levels.  Sacral pressure ulcer: Appears to be stage II. -Wound care consult  Intertrigo of the groin -Nystatin powder.  Minimize moisture and friction.  Hypertension -Hold antihypertensives in setting of sepsis  Pharmacy med rec pending.  DVT prophylaxis: Lovenox Code Status: Full code Family Communication: No family available this time. Disposition Plan: Status is: Inpatient  Remains inpatient appropriate because:Hemodynamically unstable, Altered mental status, Unsafe d/c plan, IV treatments appropriate due to intensity of illness or inability to take PO and Inpatient level of care appropriate due to severity of illness   Dispo: The patient is from: Home              Anticipated d/c is to: SNF              Anticipated d/c date is: 3 days              Patient currently is not medically stable to d/c.  The medical decision making on this patient was of high complexity and the patient is at high risk for clinical deterioration, therefore this is a level 3 visit.  Shela Leff MD [facility]  If 7PM-7AM, please contact night-coverage [email or web address]  07/22/2019, 2:34 AM

## 2019-07-22 NOTE — Consult Note (Signed)
Heather Petty for Infectious Disease    Date of Admission:  07/21/2019           Day 2 vancomycin        Day 2 cefepime        Day 2 metronidazole       Reason for Consult: Automatic consultation for MRSA and enterococcal bacteremia as     Assessment: She has polymicrobial bacteremia with MRSA and Enterococcus.  Her sacral wound does not appear to be infected but may certainly be a source for bacteremia.  Plan: 1. Continue vancomycin 2. Discontinue cefepime and metronidazole 3. Repeat blood cultures 4. TTE  Principal Problem:   Sepsis ([place]) Active Problems:   MRSA bacteremia   Enterococcal bacteremia   AKI (acute kidney injury) ([place])   Type II diabetes mellitus with renal manifestations ([place])   HTN (hypertension)   Paranoid schizophrenia ([place])   Hyperlipidemia associated with type 2 diabetes mellitus (HCC)   Pressure ulcer   Long Q-T syndrome   DKA (diabetic ketoacidoses) (HCC)   Rhabdomyolysis   Acute metabolic encephalopathy   Scheduled Meds: . enoxaparin (LOVENOX) injection  40 mg Subcutaneous Q24H  . insulin aspart  0-15 Units Subcutaneous TID WC  . insulin aspart  0-5 Units Subcutaneous QHS  . insulin glargine  15 Units Subcutaneous Q24H  . nystatin   Topical BID   Continuous Infusions: . ceFEPime (MAXIPIME) IV Stopped (07/22/19 1012)  . lactated ringers    . metronidazole Stopped (07/22/19 1520)  . [START ON 07/23/2019] vancomycin     PRN Meds:.acetaminophen **OR** acetaminophen  HPI: Heather Petty is a 69 y.o. female who was found poorly responsive by her daughter.  She was in her bathtub and it was unclear how long she had been here.  Her family had not spoken to her 48 hours.  She was brought to the ED yesterday where she was found to be febrile and septic.  No acute abnormalities were noted on chest x-ray.  Urinalysis showed pyuria.  CT scan of the abdomen and pelvis showed:  ADDENDUM: Along the posterior left subcutaneous soft tissues  overlying the iliac there is a focal area of ulceration and fat stranding changes. No loculated fluid collections or subcutaneous emphysema. No cortical destruction or bony erosion however seen at the posterior iliac.  Blood cultures are growing MRSA and Enterococcus.  Review of Systems: Review of Systems  Unable to perform ROS: Mental acuity    Past Medical History:  Diagnosis Date  . AKI (acute kidney injury) ([place]) 12/17/2017  . Arthritis   . Asthma   . Depression   . Diabetes mellitus   . Gout   . High cholesterol   . Hypertension   . Morbid obesity ([place])   . Pneumonia 12/17/2017  . Schizophrenia ([place])   . Stroke [facility])     Social History   Tobacco Use  . Smoking status: Former Research scientist (life sciences)  . Smokeless tobacco: Never Used  Vaping Use  . Vaping Use: Never used  Substance Use Topics  . Alcohol use: No  . Drug use: No    Family History  Family history unknown: Yes   Allergies  Allergen Reactions  . Lasix [Furosemide] Other (See Comments)    IV-LASIX ==> Reaction: Burning    OBJECTIVE: Blood pressure (!) 126/34, pulse 96, temperature (!) 101.8 F (38.8 C), temperature source Oral, resp. rate (!) 32, height 5' 1"  (1.549 m), weight 110 kg, SpO2 95 %.  Physical Exam Constitutional:      Comments: She is resting quietly on a stretcher in the ED.  Cardiovascular:     Rate and Rhythm: Regular rhythm. Tachycardia present.     Heart sounds: No murmur heard.   Pulmonary:     Effort: Pulmonary effort is normal.     Breath sounds: Normal breath sounds.  Abdominal:     General: There is no distension.     Palpations: Abdomen is soft. There is no mass.     Tenderness: There is no abdominal tenderness.  Musculoskeletal:        General: No swelling or tenderness.     Cervical back: Neck supple.  Skin:    Comments: She has vitiligo.  She has superficial skin breakdown over the sacrum.  There is no malodor or obvious drainage.  There are no areas of fluctuance or  crepitus.  Neurological:     Comments: She is awake but does not respond to questions and follow commands.     Lab Results Lab Results  Component Value Date   WBC 10.7 (H) 07/22/2019   HGB 11.2 (L) 07/22/2019   HCT 35.6 (L) 07/22/2019   MCV 94.9 07/22/2019   PLT 155 07/22/2019    Lab Results  Component Value Date   CREATININE 0.88 07/22/2019   BUN 17 07/22/2019   NA 138 07/22/2019   K 3.7 07/22/2019   CL 107 07/22/2019   CO2 21 (L) 07/22/2019    Lab Results  Component Value Date   ALT 29 07/21/2019   AST 34 07/21/2019   ALKPHOS 196 (H) 07/21/2019   BILITOT 1.3 (H) 07/21/2019     Microbiology: Recent Results (from the past 240 hour(s))  Culture, blood (Routine x 2)     Status: None (Preliminary result)   Collection Time: 07/21/19 10:01 PM   Specimen: BLOOD RIGHT HAND  Result Value Ref Range Status   Specimen Description BLOOD RIGHT HAND  Final   Special Requests   Final    BOTTLES DRAWN AEROBIC ONLY Blood Culture adequate volume   Culture  Setup Time   Final    GRAM POSITIVE COCCI IN CHAINS IN CLUSTERS AEROBIC BOTTLE ONLY Organism ID to follow CRITICAL RESULT CALLED TO, READ BACK BY AND VERIFIED WITH: Jene Every PHARMD [id number] 07/22/19 A BROWNING Performed at [facility], [address], [place], [place] [address]    Culture PENDING  Incomplete   Report Status PENDING  Incomplete  Blood Culture ID Panel (Reflexed)     Status: Abnormal   Collection Time: 07/21/19 10:01 PM  Result Value Ref Range Status   Enterococcus species DETECTED (A) NOT DETECTED Final   Vancomycin resistance NOT DETECTED NOT DETECTED Final   Listeria monocytogenes NOT DETECTED NOT DETECTED Final   Staphylococcus species DETECTED (A) NOT DETECTED Final    Comment: CRITICAL RESULT CALLED TO, READ BACK BY AND VERIFIED WITH: Jene Every PHARMD 1505 07/22/19 A BROWNING    Staphylococcus aureus (BCID) DETECTED (A) NOT DETECTED Final    Comment: Methicillin (oxacillin)-resistant Staphylococcus aureus  (MRSA). MRSA is predictably resistant to beta-lactam antibiotics (except ceftaroline). Preferred therapy is vancomycin unless clinically contraindicated. Patient requires contact precautions if  hospitalized. CRITICAL RESULT CALLED TO, READ BACK BY AND VERIFIED WITH: Jene Every PHARMD 1505 07/22/19 A BROWNING    Methicillin resistance DETECTED (A) NOT DETECTED Final    Comment: CRITICAL RESULT CALLED TO, READ BACK BY AND VERIFIED WITH: M [facility] PHARMD 1505 07/22/19 A BROWNING  Streptococcus species NOT DETECTED NOT DETECTED Final   Streptococcus agalactiae NOT DETECTED NOT DETECTED Final   Streptococcus pneumoniae NOT DETECTED NOT DETECTED Final   Streptococcus pyogenes NOT DETECTED NOT DETECTED Final   Acinetobacter baumannii NOT DETECTED NOT DETECTED Final   Enterobacteriaceae species NOT DETECTED NOT DETECTED Final   Enterobacter cloacae complex NOT DETECTED NOT DETECTED Final   Escherichia coli NOT DETECTED NOT DETECTED Final   Klebsiella oxytoca NOT DETECTED NOT DETECTED Final   Klebsiella pneumoniae NOT DETECTED NOT DETECTED Final   Proteus species NOT DETECTED NOT DETECTED Final   Serratia marcescens NOT DETECTED NOT DETECTED Final   Haemophilus influenzae NOT DETECTED NOT DETECTED Final   Neisseria meningitidis NOT DETECTED NOT DETECTED Final   Pseudomonas aeruginosa NOT DETECTED NOT DETECTED Final   Candida albicans NOT DETECTED NOT DETECTED Final   Candida glabrata NOT DETECTED NOT DETECTED Final   Candida krusei NOT DETECTED NOT DETECTED Final   Candida parapsilosis NOT DETECTED NOT DETECTED Final   Candida tropicalis NOT DETECTED NOT DETECTED Final    Comment: Performed at [facility], [place] [address], [place], [place] [address]  Culture, blood (Routine x 2)     Status: None (Preliminary result)   Collection Time: 07/21/19 10:05 PM   Specimen: BLOOD RIGHT FOREARM  Result Value Ref Range Status   Specimen Description BLOOD RIGHT FOREARM  Final   Special Requests   Final      BOTTLES DRAWN AEROBIC ONLY Blood Culture adequate volume   Culture  Setup Time   Final    GRAM POSITIVE COCCI IN CLUSTERS AEROBIC BOTTLE ONLY    Culture   Final    NO GROWTH < 12 HOURS Performed at [facility], [place] [address], [place], [place] [address]    Report Status PENDING  Incomplete  SARS Coronavirus 2 by RT PCR (hospital order, performed in [facility] hospital lab) Nasopharyngeal Nasopharyngeal Swab     Status: None   Collection Time: 07/21/19 10:17 PM   Specimen: Nasopharyngeal Swab  Result Value Ref Range Status   SARS Coronavirus 2 NEGATIVE NEGATIVE Final    Comment: (NOTE) SARS-CoV-2 target nucleic acids are NOT DETECTED.  The SARS-CoV-2 RNA is generally detectable in upper and lower respiratory specimens during the acute phase of infection. The lowest concentration of SARS-CoV-2 viral copies this assay can detect is 250 copies / mL. A negative result does not preclude SARS-CoV-2 infection and should not be used as the sole basis for treatment or other patient management decisions.  A negative result may occur with improper specimen collection / handling, submission of specimen other than nasopharyngeal swab, presence of viral mutation(s) within the areas targeted by this assay, and inadequate number of viral copies (<250 copies / mL). A negative result must be combined with clinical observations, patient history, and epidemiological information.  Fact Sheet for Patients:   StrictlyIdeas.no  Fact Sheet for Healthcare Providers: BankingDealers.co.za  This test is not yet approved or  cleared by the [place] FDA and has been authorized for detection and/or diagnosis of SARS-CoV-2 by FDA under an Emergency Use Authorization (EUA).  This EUA will remain in effect (meaning this test can be used) for the duration of the COVID-19 declaration under Section 564(b)(1) of the Act, 21 U.S.C. section  360bbb-3(b)(1), unless the authorization is terminated or revoked sooner.  Performed at [facility], [place] [address], [place], [place] [address]     Michel Bickers, [place] for  Infectious Disease [place] [phone] pager   [phone] cell 07/22/2019, 4:26 PM

## 2019-07-23 ENCOUNTER — Inpatient Hospital Stay (HOSPITAL_COMMUNITY): Payer: Medicare Other

## 2019-07-23 DIAGNOSIS — R7881 Bacteremia: Secondary | ICD-10-CM

## 2019-07-23 DIAGNOSIS — L98422 Non-pressure chronic ulcer of back with fat layer exposed: Secondary | ICD-10-CM

## 2019-07-23 DIAGNOSIS — B952 Enterococcus as the cause of diseases classified elsewhere: Secondary | ICD-10-CM

## 2019-07-23 LAB — CBC
HCT: 34.4 % — ABNORMAL LOW (ref 36.0–46.0)
Hemoglobin: 10.9 g/dL — ABNORMAL LOW (ref 12.0–15.0)
MCH: 29.5 pg (ref 26.0–34.0)
MCHC: 31.7 g/dL (ref 30.0–36.0)
MCV: 93.2 fL (ref 80.0–100.0)
Platelets: 132 10*3/uL — ABNORMAL LOW (ref 150–400)
RBC: 3.69 MIL/uL — ABNORMAL LOW (ref 3.87–5.11)
RDW: 13 % (ref 11.5–15.5)
WBC: 8.4 10*3/uL (ref 4.0–10.5)
nRBC: 0 % (ref 0.0–0.2)

## 2019-07-23 LAB — COMPREHENSIVE METABOLIC PANEL
ALT: 34 U/L (ref 0–44)
AST: 53 U/L — ABNORMAL HIGH (ref 15–41)
Albumin: 2.3 g/dL — ABNORMAL LOW (ref 3.5–5.0)
Alkaline Phosphatase: 125 U/L (ref 38–126)
Anion gap: 9 (ref 5–15)
BUN: 16 mg/dL (ref 8–23)
CO2: 21 mmol/L — ABNORMAL LOW (ref 22–32)
Calcium: 8.4 mg/dL — ABNORMAL LOW (ref 8.9–10.3)
Chloride: 108 mmol/L (ref 98–111)
Creatinine, Ser: 0.88 mg/dL (ref 0.44–1.00)
GFR calc Af Amer: >60 mL/min (ref >60)
GFR calc non Af Amer: >60 mL/min (ref >60)
Glucose, Bld: 202 mg/dL — ABNORMAL HIGH (ref 70–99)
Potassium: 3.6 mmol/L (ref 3.5–5.1)
Sodium: 138 mmol/L (ref 135–145)
Total Bilirubin: 1.6 mg/dL — ABNORMAL HIGH (ref 0.3–1.2)
Total Protein: 5.5 g/dL — ABNORMAL LOW (ref 6.5–8.1)

## 2019-07-23 LAB — URINE CULTURE: Culture: 80000 — AB

## 2019-07-23 LAB — GLUCOSE, CAPILLARY
Glucose-Capillary: 225 mg/dL — ABNORMAL HIGH (ref 70–99)
Glucose-Capillary: 284 mg/dL — ABNORMAL HIGH (ref 70–99)
Glucose-Capillary: 214 mg/dL — ABNORMAL HIGH (ref 70–99)
Glucose-Capillary: 274 mg/dL — ABNORMAL HIGH (ref 70–99)

## 2019-07-23 LAB — ECHOCARDIOGRAM COMPLETE
Height: 61 in
Weight: 3880.1 oz

## 2019-07-23 LAB — PHOSPHORUS: Phosphorus: 2.7 mg/dL (ref 2.5–4.6)

## 2019-07-23 LAB — CK: Total CK: 591 U/L — ABNORMAL HIGH (ref 38–234)

## 2019-07-23 LAB — MAGNESIUM: Magnesium: 1.4 mg/dL — ABNORMAL LOW (ref 1.7–2.4)

## 2019-07-23 MED ORDER — ALPRAZOLAM 0.25 MG PO TABS
0.2500 mg | ORAL_TABLET | Freq: Three times a day (TID) | ORAL | Status: DC | PRN
  Administered 2019-07-27 – 2019-07-31 (×3): 0.25 mg via ORAL
  Filled 2019-07-23 (×3): qty 1

## 2019-07-23 MED ORDER — MAGNESIUM SULFATE 4 GM/100ML IV SOLN
4.0000 g | Freq: Once | INTRAVENOUS | Status: AC
  Administered 2019-07-23: 4 g via INTRAVENOUS
  Filled 2019-07-23: qty 100

## 2019-07-23 MED ORDER — IBUPROFEN 200 MG PO TABS
400.0000 mg | ORAL_TABLET | Freq: Four times a day (QID) | ORAL | Status: DC | PRN
  Administered 2019-07-23 – 2019-07-24 (×2): 400 mg via ORAL
  Filled 2019-07-23 (×2): qty 2

## 2019-07-23 MED ORDER — LABETALOL HCL 5 MG/ML IV SOLN: 5.0000 mg | INTRAVENOUS | Status: DC | PRN

## 2019-07-23 NOTE — Progress Notes (Signed)
Inpatient Diabetes Program Recommendations  AACE/ADA: New Consensus Statement on Inpatient Glycemic Control (2015)  Target Ranges:  Prepandial:   less than 140 mg/dL      Peak postprandial:   less than 180 mg/dL (1-2 hours)      Critically ill patients:  140 - 180 mg/dL   Lab Results  Component Value Date   GLUCAP 284 (H) 07/23/2019   HGBA1C 10.4 (H) 07/22/2019    Review of Glycemic Control Results for Heather Petty, Heather Petty (MRN [id number]) as of 07/23/2019 13:11  Ref. Range 07/22/2019 15:14 07/22/2019 17:05 07/22/2019 21:13 07/23/2019 09:51 07/23/2019 12:01  Glucose-Capillary Latest Ref Range: 70 - 99 mg/dL 161 (H) 169 (H) 146 (H) 225 (H) 284 (H)   Diabetes history: DM2 Outpatient Diabetes medications: Lantus 7 units qhs, Novolog 0-12 units tid  Current orders for Inpatient glycemic control: Lantus 15 units daily (not given 6/15) + Novolog moderate correction tid + hs  Inpatient Diabetes Program Recommendations:   Noted patient did not receive Lantus dose yesterday and communicated with nurse. Consider -Decrease in Novolog correction to sensitive  Thank you, Nani Gasser. Michaelina Blandino, RN, MSN, CDE  Diabetes Coordinator Inpatient Glycemic Control Team Team Pager [phone] (8am-5pm) 07/23/2019 1:16 PM

## 2019-07-23 NOTE — TOC Initial Note (Signed)
Transition of Care [facility]) - Initial/Assessment Note    Patient Details  Name: Heather Petty MRN: [id number] Date of Birth: Jun 19, 1950  Transition [facility]) CM/SW Contact:    Geralynn Ochs, LCSW Phone Number: 07/23/2019, 3:51 PM  Clinical Narrative:     CSW met with patient and daughter at bedside to discuss recommendation for SNF. Daughter discussed how the patient needs more help at home; previously had aides with [facility] but they canceled them and have not been able to get them back. CSW explained process for getting aides back, but that it takes time. CSW explained short term SNF in the interim while waiting to get aides in place, and patient and daughter in agreement. Patient has been to [facility] in the past and was happy with care there, would like to go back if possible.   Patient's daughter also asked about other assistance at home, including any assistance with meals or cleaning when aides can't be there. CSW to provide information to daughter for additional services as appropriate. CSW to fax out referral to [facility] for SNF.              Expected Discharge Plan: Skilled Nursing Facility Barriers to Discharge: Continued Medical Work up, Ship broker   Patient Goals and CMS Choice Patient states their goals for this hospitalization and ongoing recovery are:: patient unable to state goals due to disorientation CMS Medicare.gov Compare Post Acute Care list provided to:: Patient Represenative (must comment) Choice offered to / list presented to : Adult Children  Expected Discharge Plan and Services Expected Discharge Plan: [place] Acute Care Choice: [place] arrangements for the past 2 months: Single Family Home                                      Prior Living Arrangements/Services Living arrangements for the past 2 months: Single Family Home Lives with:: Self Patient language and need for  interpreter reviewed:: No Do you feel safe going back to the place where you live?: Yes      Need for Family Participation in Patient Care: Yes (Comment) Care giver support system in place?: No (comment)   Criminal Activity/Legal Involvement Pertinent to Current Situation/Hospitalization: No - Comment as needed  Activities of Daily Living      Permission Sought/Granted Permission sought to share information with : Facility Sport and exercise psychologist, Family Supports Permission granted to share information with : Yes, Verbal Permission Granted  Share Information with NAME: Margreta Journey  Permission granted to share info w AGENCY: SNF  Permission granted to share info w Relationship: Daughter     Emotional Assessment Appearance:: Appears stated age Attitude/Demeanor/Rapport: Unable to Assess Affect (typically observed): Unable to Assess Orientation: : Oriented to Self, Oriented to Place, Oriented to  Time, Oriented to Situation Alcohol / Substance Use: Not Applicable Psych Involvement: No (comment)  Admission diagnosis:  Acute vulvitis [N76.2] Wound of sacral region, initial encounter [S31.000A] Sepsis ([place]) [A41.9] Diabetic ketoacidosis with coma associated with type 2 diabetes mellitus ([place]) [E11.11] Patient Active Problem List   Diagnosis Date Noted  . DKA (diabetic ketoacidoses) ([place]) 07/22/2019  . Rhabdomyolysis 07/22/2019  . Acute metabolic encephalopathy 99/35/7017  . MRSA bacteremia 07/22/2019  . Enterococcal bacteremia 07/22/2019  . Vitiligo 07/22/2019  . 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 Sepsis ([place]) 07/19/2018  . Ventricular tachycardia, sustained ([place]) 07/19/2018  .  NSTEMI (non-ST elevated myocardial infarction) ([place]) 07/19/2018  . Wound infection 04/03/2018  . Morbid obesity with BMI of 50.0-59.9, adult ([place]) 04/03/2018  . Elevated alkaline phosphatase level 04/03/2018  . Asthma exacerbation 03/14/2018  . Hypotension 03/14/2018  . Lactic acidosis 03/14/2018  . GERD  (gastroesophageal reflux disease) 03/14/2018  . Acute renal failure superimposed on stage 3 chronic kidney disease ([place]) 03/14/2018  . Macrocytic anemia 03/14/2018  . Abnormal LFTs 03/14/2018  . Acute kidney injury ([place]) 02/28/2018  . Wound of sacral region 02/28/2018  . Hypothermia 02/28/2018  . Stage II pressure ulcer of sacral region ([place]) 02/15/2018  . DM II (diabetes mellitus, type II), controlled ([place]) 02/09/2018  . Schizophrenia ([place]) 02/09/2018  . Pressure ulcer 02/06/2018  . Acute renal failure (ARF) ([place]) 02/05/2018  . Hyperlipidemia associated with type 2 diabetes mellitus ([place]) 02/05/2018  . Hyperkalemia 02/05/2018  . Dermatitis 02/05/2018  . Open back wound 02/05/2018  . Dysuria 02/05/2018  . Paranoid schizophrenia ([place])   . AKI (acute kidney injury) ([place]) 12/17/2017  . Type II diabetes mellitus with renal manifestations ([place]) 12/17/2017  . HTN (hypertension) 12/17/2017  . Gout    PCP:  [name], MD Pharmacy:   [place], [place] [address] Phone: [phone] Fax: [phone]  [id number] - [place], [place] - [address] [place] [address] Phone: [phone] Fax: [phone]     Social Determinants of Health (SDOH) Interventions    Readmission Risk Interventions No flowsheet data found.

## 2019-07-23 NOTE — Progress Notes (Signed)
Patient ID: Heather Petty, female   DOB: 1950-08-10, 69 y.o.   MRN: [id number]         [facility]  Date of Admission:  07/21/2019           Day 3 vancomycin ASSESSMENT: She has improved overnight on therapy for MRSA and enterococcal bacteremia.  Her urine culture has grown Klebsiella which I will need more.  Suspect that her superficial sacral wound is the source although she does not have any obvious wound infection.  PLAN: 1. Continue vancomycin 2. Await results of repeat blood cultures and TTE  Principal Problem:   MRSA bacteremia Active Problems:   Sepsis ([place])   Enterococcal bacteremia   AKI (acute kidney injury) ([place])   Type II diabetes mellitus with renal manifestations ([place])   HTN (hypertension)   Paranoid schizophrenia ([place])   Hyperlipidemia associated with type 2 diabetes mellitus ([place])   Pressure ulcer   Long Q-T syndrome   DKA (diabetic ketoacidoses) ([facility])   Rhabdomyolysis   Acute metabolic encephalopathy   Vitiligo   Scheduled Meds: . enoxaparin (LOVENOX) injection  40 mg Subcutaneous Q24H  . insulin aspart  0-15 Units Subcutaneous TID WC  . insulin aspart  0-5 Units Subcutaneous QHS  . insulin glargine  15 Units Subcutaneous Q24H  . nystatin   Topical BID   Continuous Infusions: . lactated ringers 100 mL/hr at 07/22/19 1707  . vancomycin 1,250 mg (07/22/19 2303)   PRN Meds:.acetaminophen **OR** acetaminophen, labetalol   SUBJECTIVE: She is much more alert.  She has no recall of becoming a gentleman being admitted to the hospital.  Review of Systems: Review of Systems  Unable to perform ROS: Mental acuity    Allergies  Allergen Reactions  . Lasix [Furosemide] Other (See Comments)    IV-LASIX ==> Reaction: Burning    OBJECTIVE: Vitals:   07/23/19 0335 07/23/19 0349 07/23/19 0804 07/23/19 0842  BP:   (!) 119/55 (!) 127/57  Pulse:  86 90 87  Resp:  (!) 26 (!) 25 (!) 22  Temp: 99.5 F (37.5 C) 99.5 F (37.5  C) 99.5 F (37.5 C) 98.7 F (37.1 C)  TempSrc: Oral  Oral Oral  SpO2:  96% 97% 97%  Weight:      Height:       Body mass index is 45.82 kg/m.  Physical Exam Constitutional:      Comments: She is much more alert and interactive.  She still has difficulty answering questions about her illness.  Cardiovascular:     Rate and Rhythm: Normal rate and regular rhythm.     Heart sounds: No murmur heard.   Pulmonary:     Effort: Pulmonary effort is normal.     Breath sounds: Normal breath sounds.  Abdominal:     Palpations: Abdomen is soft.     Tenderness: There is no abdominal tenderness.  Musculoskeletal:     Cervical back: Neck supple.  Skin:    Findings: No rash.     Lab Results Lab Results  Component Value Date   WBC 8.4 07/23/2019   HGB 10.9 (L) 07/23/2019   HCT 34.4 (L) 07/23/2019   MCV 93.2 07/23/2019   PLT 132 (L) 07/23/2019    Lab Results  Component Value Date   CREATININE 0.88 07/23/2019   BUN 16 07/23/2019   NA 138 07/23/2019   K 3.6 07/23/2019   CL 108 07/23/2019   CO2 21 (L) 07/23/2019    Lab Results  Component Value  Date   ALT 34 07/23/2019   AST 53 (H) 07/23/2019   ALKPHOS 125 07/23/2019   BILITOT 1.6 (H) 07/23/2019     Microbiology: Recent Results (from the past 240 hour(s))  Culture, blood (Routine x 2)     Status: None (Preliminary result)   Collection Time: 07/21/19 10:01 PM   Specimen: BLOOD RIGHT HAND  Result Value Ref Range Status   Specimen Description BLOOD RIGHT HAND  Final   Special Requests   Final    BOTTLES DRAWN AEROBIC ONLY Blood Culture adequate volume   Culture  Setup Time   Final    GRAM POSITIVE COCCI IN CHAINS IN CLUSTERS AEROBIC BOTTLE ONLY CRITICAL RESULT CALLED TO, READ BACK BY AND VERIFIED WITH: Jene Every PHARMD 1505 07/22/19 A BROWNING Performed at [facility], [place] [address], [place], [place] [address]    Culture GRAM POSITIVE COCCI  Final   Report Status PENDING  Incomplete  Blood Culture ID Panel  (Reflexed)     Status: Abnormal   Collection Time: 07/21/19 10:01 PM  Result Value Ref Range Status   Enterococcus species DETECTED (A) NOT DETECTED Final    Comment: CRITICAL RESULT CALLED TO, READ BACK BY AND VERIFIED WITH: M [facility] PHARMD 1505 07/22/19 A BROWNING    Vancomycin resistance NOT DETECTED NOT DETECTED Final   Listeria monocytogenes NOT DETECTED NOT DETECTED Final   Staphylococcus species DETECTED (A) NOT DETECTED Final    Comment: CRITICAL RESULT CALLED TO, READ BACK BY AND VERIFIED WITH: Jene Every PHARMD 1505 07/22/19 A BROWNING    Staphylococcus aureus (BCID) DETECTED (A) NOT DETECTED Final    Comment: Methicillin (oxacillin)-resistant Staphylococcus aureus (MRSA). MRSA is predictably resistant to beta-lactam antibiotics (except ceftaroline). Preferred therapy is vancomycin unless clinically contraindicated. Patient requires contact precautions if  hospitalized. CRITICAL RESULT CALLED TO, READ BACK BY AND VERIFIED WITH: Jene Every PHARMD 1505 07/22/19 A BROWNING    Methicillin resistance DETECTED (A) NOT DETECTED Final    Comment: CRITICAL RESULT CALLED TO, READ BACK BY AND VERIFIED WITH: Jene Every PHARMD 1505 07/22/19 A BROWNING    Streptococcus species NOT DETECTED NOT DETECTED Final   Streptococcus agalactiae NOT DETECTED NOT DETECTED Final   Streptococcus pneumoniae NOT DETECTED NOT DETECTED Final   Streptococcus pyogenes NOT DETECTED NOT DETECTED Final   Acinetobacter baumannii NOT DETECTED NOT DETECTED Final   Enterobacteriaceae species NOT DETECTED NOT DETECTED Final   Enterobacter cloacae complex NOT DETECTED NOT DETECTED Final   Escherichia coli NOT DETECTED NOT DETECTED Final   Klebsiella oxytoca NOT DETECTED NOT DETECTED Final   Klebsiella pneumoniae NOT DETECTED NOT DETECTED Final   Proteus species NOT DETECTED NOT DETECTED Final   Serratia marcescens NOT DETECTED NOT DETECTED Final   Haemophilus influenzae NOT DETECTED NOT DETECTED Final   Neisseria meningitidis NOT  DETECTED NOT DETECTED Final   Pseudomonas aeruginosa NOT DETECTED NOT DETECTED Final   Candida albicans NOT DETECTED NOT DETECTED Final   Candida glabrata NOT DETECTED NOT DETECTED Final   Candida krusei NOT DETECTED NOT DETECTED Final   Candida parapsilosis NOT DETECTED NOT DETECTED Final   Candida tropicalis NOT DETECTED NOT DETECTED Final    Comment: Performed at [facility], [place]. [address], [place], [place] [address]  Culture, blood (Routine x 2)     Status: Abnormal (Preliminary result)   Collection Time: 07/21/19 10:05 PM   Specimen: BLOOD RIGHT FOREARM  Result Value Ref Range Status   Specimen Description BLOOD RIGHT FOREARM  Final   Special Requests   Final    BOTTLES DRAWN AEROBIC ONLY Blood Culture adequate volume   Culture  Setup Time   Final    GRAM POSITIVE COCCI IN CLUSTERS AEROBIC BOTTLE ONLY CRITICAL VALUE NOTED.  VALUE IS CONSISTENT WITH PREVIOUSLY REPORTED AND CALLED VALUE. Performed at [facility], [place] [address] [place], [place] [address]    Culture STAPHYLOCOCCUS AUREUS (A)  Final   Report Status PENDING  Incomplete  Urine culture     Status: Abnormal   Collection Time: 07/21/19 10:07 PM   Specimen: Urine, Catheterized  Result Value Ref Range Status   Specimen Description URINE, CATHETERIZED  Final   Special Requests   Final    NONE Performed at [facility], [place] [address], [place], [place] [address]    Culture 80,000 COLONIES/mL KLEBSIELLA PNEUMONIAE (A)  Final   Report Status 07/23/2019 FINAL  Final   Organism ID, Bacteria KLEBSIELLA PNEUMONIAE (A)  Final      Susceptibility   Klebsiella pneumoniae - MIC*    AMPICILLIN >=32 RESISTANT Resistant     CEFAZOLIN <=4 SENSITIVE Sensitive     CEFTRIAXONE <=0.25 SENSITIVE Sensitive     CIPROFLOXACIN <=0.25 SENSITIVE Sensitive     GENTAMICIN <=1 SENSITIVE Sensitive     IMIPENEM <=0.25 SENSITIVE Sensitive     NITROFURANTOIN 32 SENSITIVE Sensitive     TRIMETH/SULFA <=20 SENSITIVE Sensitive      AMPICILLIN/SULBACTAM 4 SENSITIVE Sensitive     PIP/TAZO <=4 SENSITIVE Sensitive     * 80,000 COLONIES/mL KLEBSIELLA PNEUMONIAE  SARS Coronavirus 2 by RT PCR (hospital order, performed in [facility] hospital lab) Nasopharyngeal Nasopharyngeal Swab     Status: None   Collection Time: 07/21/19 10:17 PM   Specimen: Nasopharyngeal Swab  Result Value Ref Range Status   SARS Coronavirus 2 NEGATIVE NEGATIVE Final    Comment: (NOTE) SARS-CoV-2 target nucleic acids are NOT DETECTED.  The SARS-CoV-2 RNA is generally detectable in upper and lower respiratory specimens during the acute phase of infection. The lowest concentration of SARS-CoV-2 viral copies this assay can detect is 250 copies / mL. A negative result does not preclude SARS-CoV-2 infection and should not be used as the sole basis for treatment or other patient management decisions.  A negative result may occur with improper specimen collection / handling, submission of specimen other than nasopharyngeal swab, presence of viral mutation(s) within the areas targeted by this assay, and inadequate number of viral copies (<250 copies / mL). A negative result must be combined with clinical observations, patient history, and epidemiological information.  Fact Sheet for Patients:   StrictlyIdeas.no  Fact Sheet for Healthcare Providers: BankingDealers.co.za  This test is not yet approved or  cleared by the [place] FDA and has been authorized for detection and/or diagnosis of SARS-CoV-2 by FDA under an Emergency Use Authorization (EUA).  This EUA will remain in effect (meaning this test can be used) for the duration of the COVID-19 declaration under Section 564(b)(1) of the Act, 21 U.S.C. section 360bbb-3(b)(1), unless the authorization is terminated or revoked sooner.  Performed at [facility], [place] [address], [place], [place] [address]   Respiratory Panel by PCR      Status: None   Collection Time: 07/22/19 12:58 AM   Specimen: Nasopharyngeal Swab; Respiratory  Result Value Ref Range Status   Adenovirus NOT DETECTED NOT DETECTED Final   Coronavirus 229E NOT DETECTED NOT DETECTED Final    Comment: (NOTE) The  Coronavirus on the Respiratory Panel, DOES NOT test for the novel  Coronavirus (2019 nCoV)    Coronavirus HKU1 NOT DETECTED NOT DETECTED Final   Coronavirus NL63 NOT DETECTED NOT DETECTED Final   Coronavirus OC43 NOT DETECTED NOT DETECTED Final   Metapneumovirus NOT DETECTED NOT DETECTED Final   Rhinovirus / Enterovirus NOT DETECTED NOT DETECTED Final   Influenza A NOT DETECTED NOT DETECTED Final   Influenza B NOT DETECTED NOT DETECTED Final   Parainfluenza Virus 1 NOT DETECTED NOT DETECTED Final   Parainfluenza Virus 2 NOT DETECTED NOT DETECTED Final   Parainfluenza Virus 3 NOT DETECTED NOT DETECTED Final   Parainfluenza Virus 4 NOT DETECTED NOT DETECTED Final   Respiratory Syncytial Virus NOT DETECTED NOT DETECTED Final   Bordetella pertussis NOT DETECTED NOT DETECTED Final   Chlamydophila pneumoniae NOT DETECTED NOT DETECTED Final   Mycoplasma pneumoniae NOT DETECTED NOT DETECTED Final    Comment: Performed at [facility], [place] [address] [place], [place] [address]    Michel Bickers, [facility] [phone] pager   [phone] cell 07/23/2019, 10:01 AM

## 2019-07-23 NOTE — Progress Notes (Signed)
  Echocardiogram 2D Echocardiogram has been performed.  Heather Petty 07/23/2019, 4:33 PM

## 2019-07-23 NOTE — Progress Notes (Signed)
PROGRESS NOTE  Marycarmen Hagey FGH:[id number] DOB: 01-Sep-1950 DOA: 07/21/2019 PCP: [name], MD  Brief History   Mrs. Robotham is a 69 y.o. F with hx HTN, IDDM, obesity, hx CVA, schizophrenia/depression and asthma who presented with acute AMS.  Rreportedly found by her daughter slumped over in her bathtub, family had last spoken to her 2 days ago.  EMS activated, found CBG 550, tachycardic.  In the ER, temp 103F, tachypneic and tachycardic.  Lactate 3.2, Cr 1.4 from baseline 0.9, CK 695.  CT head, chest abdomen and pelvis notable only for ulceration and fat stranding over left iliac.  Given fluids, IV insulin and empiric antibiotics and admitted to hospitalist service. DKA resolved, and CK has diminished.  The patient was transferred [facility] on 07/22/2019 for further evaluation.   Blood cultures x 2 have grown out MRSA. One of them has grown out enterococcus and MRSA. Urine culture has grown out Klebsiella. Infectious disease has been consulted. She is receiving IV Vancomycin. Echocardiogram is pending.   Consultants   Infectious disease  Wound care  Procedures   None  Antibiotics   Anti-infectives (From admission, onward)   Start     Dose/Rate Route Frequency Ordered Stop   07/23/19 0000  vancomycin (VANCOREADY) IVPB 1250 mg/250 mL     Discontinue     1,250 mg 166.7 mL/hr over 90 Minutes Intravenous Every 24 hours 07/22/19 0252     07/22/19 1000  ceFEPIme (MAXIPIME) 2 g in sodium chloride 0.9 % 100 mL IVPB  Status:  Discontinued        2 g 200 mL/hr over 30 Minutes Intravenous Every 12 hours 07/22/19 0252 07/22/19 1636   07/22/19 0600  metroNIDAZOLE (FLAGYL) IVPB 500 mg  Status:  Discontinued        500 mg 100 mL/hr over 60 Minutes Intravenous Every 8 hours 07/22/19 0240 07/22/19 1636   07/21/19 2200  ceFEPIme (MAXIPIME) 2 g in sodium chloride 0.9 % 100 mL IVPB        2 g 200 mL/hr over 30 Minutes Intravenous  Once 07/21/19 2148 07/21/19 2254    07/21/19 2200  metroNIDAZOLE (FLAGYL) IVPB 500 mg        500 mg 100 mL/hr over 60 Minutes Intravenous  Once 07/21/19 2148 07/21/19 2302   07/21/19 2200  vancomycin (VANCOCIN) IVPB 1000 mg/200 mL premix  Status:  Discontinued        1,000 mg 200 mL/hr over 60 Minutes Intravenous  Once 07/21/19 2148 07/21/19 2153   07/21/19 2200  vancomycin (VANCOCIN) 2,250 mg in sodium chloride 0.9 % 500 mL IVPB        2,250 mg 250 mL/hr over 120 Minutes Intravenous  Once 07/21/19 2153 07/22/19 0058      Subjective  The patient is resting comfortably. No new complaints.  Objective   Vitals:  Vitals:   07/23/19 0842 07/23/19 1203  BP: (!) 127/57 (!) 135/56  Pulse: 87 92  Resp: (!) 22 19  Temp: 98.7 F (37.1 C) 98.6 F (37 C)  SpO2: 97% 97%   Exam:  Constitutional:   The patient is awake, alert, and oriented x 3. No acute distress. Respiratory:   No increased work of breathing.  No wheezes, rales, or rhonchi  No tactile fremitus Cardiovascular:   Regular rate and rhythm  No murmurs, ectopy, or gallups.  No lateral PMI. No thrills. Abdomen:   Abdomen is soft, non-tender, non-distended  No hernias, masses, or organomegaly  Normoactive bowel  sounds.  Musculoskeletal:   No cyanosis, clubbing, or edema Skin:   No rashes, lesions, ulcers  palpation of skin: no induration or nodules Neurologic:   CN 2-12 intact  Sensation all 4 extremities intact Psychiatric:   Mental status o Mood, affect appropriate o Orientation to person, place, time   judgment and insight appear intact I have personally reviewed the following:   Today's Data   Vitals, BMP, CBC, CK, Glucoses  Micro Data   Blood Cultures x 2 (07/21/2019)  Urine culture (07/21/2019)  Scheduled Meds:  enoxaparin (LOVENOX) injection  40 mg Subcutaneous Q24H   insulin aspart  0-15 Units Subcutaneous TID WC   insulin aspart  0-5 Units Subcutaneous QHS   insulin glargine  15 Units Subcutaneous Q24H    nystatin   Topical BID   Continuous Infusions:  lactated ringers 100 mL/hr at 07/22/19 1707   vancomycin 1,250 mg (07/22/19 2303)    Principal Problem:   MRSA bacteremia Active Problems:   AKI (acute kidney injury) ([place])   Type II diabetes mellitus with renal manifestations ([place])   HTN (hypertension)   Paranoid schizophrenia ([place])   Hyperlipidemia associated with type 2 diabetes mellitus ([facility])   Pressure ulcer   Sepsis ([place])   Long Q-T syndrome   DKA (diabetic ketoacidoses) ([place])   Rhabdomyolysis   Acute metabolic encephalopathy   Enterococcal bacteremia   Vitiligo   LOS: 1 day   A & P  Sepsis: Due to MRSA and enterococcal bacteremia. Likely as a result of UTI and sacral decubitus ulcer. The patient is currently on vancomycin. I appreciate infectious disease's assistance. Echocardiogram is pending. The patient presented with fever, tachycardia, lactic acidemia and encephalopathy/AKI in setting of probably infected sacral ulcer, otherwise source unclear. Encephalopathy appears to be clearing.   DKA: Multifactorial. Acute infection/bacteremia in setting of dehydration and lack of access to insulin for 1-2 days while she was down. The patient is currently receiving Lantus 15 units daily with FSBS and SSI. DKA is resolved.   Rhabdomyolysis: Resolving. Mild, likely too low to be cause of AKI, which is more likely from prerenal injury in sepsis. Monitor and continue IV fluids until CK is normalized.  AKI: Resolved. Creatinine was 1.42 on admission. Creatinine 0.88 this morning after IV fluids. Continue to monitor creatinine, electrolytes, and volume status.   Acute metabolic encephalopathy: Resolved. Continue to hold Risperdal, gabapentin for now.  Stage II sacral pressure ulcer (POA): Wound care consulted. Likely source of MRSA bacteremia.   Hypertension: The patient is normotensive despite lasix, nadolol, amlodipine, and benazapril being held. Hold Lasix, nadolol,  amlodippine, and benazepril being held.  Asthma: Noted. Stable and quiescent.  Schizoaffective disorder: Hold Risperdal for now  I have seen and examined this patient myself. I have spent 34 minutes in her evaluation and care.  DVT prophylaxis: Lovenox CODE STATUS: Full Code FAMILY Communication: I have discussed the patient in detail with the patient's daughter Margreta Journey. All questions answered to the best of my ability. Disposition: Status is: Inpatient  Remains inpatient appropriate because:Inpatient level of care appropriate due to severity of illness.  Dispo: The patient is from: Home  Anticipated d/c is to: TBD  Anticipated d/c date is: 3 days  Patient currently is not medically stable to d/c.  Najmo Pardue, DO [facility] Direct contact: see [email or web address]  7PM-7AM contact night coverage as above 07/23/2019, 4:42 PM  LOS: 1 day

## 2019-07-23 NOTE — Evaluation (Signed)
Physical Therapy Evaluation Patient Details Name: Heather Petty MRN: [id number] DOB: Jul 11, 1950 Today's Date: 07/23/2019   History of Present Illness  Pt is a 69 y.o. F with hx HTN, IDDM, obesity, hx CVA, schizophrenia/depression and asthma who presented with acute AMS. Reportedly found by her daughter slumped over in her bathtub, family had last spoken to her 2 days ago.  Pt admitted with sepsis and mild rhabdo.  Clinical Impression  Pt admitted with above diagnosis. Pt requiring min A for transfers and short distance ambulation.  Pt fatigued easily and required multimodal cues and facilitation for transfers.  She was able to communicate and participate today with increased time.  Pt does live alone and is unsafe to return home alone at this time from PT perspective.  She does have good rehab potential.  Recommending SNF at this time; however, if pt progresses quickly and could have 24 hr supervision initially, may be able to progress to home with HHPT level. Pt currently with functional limitations due to the deficits listed below (see PT Problem List). Pt will benefit from skilled PT to increase their independence and safety with mobility to allow discharge to the venue listed below.       Follow Up Recommendations SNF;Supervision/Assistance - 24 hour    Equipment Recommendations  None recommended by PT (has dme)    Recommendations for Other Services       Precautions / Restrictions Precautions Precautions: Fall      Mobility  Bed Mobility Overal bed mobility: Needs Assistance Bed Mobility: Supine to Sit     Supine to sit: Min assist;HOB elevated     General bed mobility comments: increased time; use of bed pad to faciliate scoot forward  Transfers Overall transfer level: Needs assistance Equipment used: 1 person hand held assist (and armrest bsc/recliner) Transfers: Sit to/from Stand;Stand Pivot Transfers Sit to Stand: Min assist Stand pivot transfers: Min assist        General transfer comment: increased time; min multimodal cues to initiate; sit to stand x 3; transferred to bsc then to recliner  Ambulation/Gait Ambulation/Gait assistance: Min assist Gait Distance (Feet): 2 Feet (x2) Assistive device: 1 person hand held assist (use of bed rail and armrest (unable to fit walker in tight area between tele, IV, bsc, and recliner and window unit) Gait Pattern/deviations: Step-to pattern;Decreased stride length;Shuffle Gait velocity: decreased   General Gait Details: min A to steady; fatigued easily  Science writer    Modified Rankin (Stroke Patients Only)       Balance Overall balance assessment: Needs assistance Sitting-balance support: Bilateral upper extremity supported Sitting balance-Leahy Scale: Good     Standing balance support: Single extremity supported;During functional activity Standing balance-Leahy Scale: Fair Standing balance comment: reliant on UE support                             Pertinent Vitals/Pain Pain Assessment: No/denies pain    Home Living Family/patient expects to be discharged to:: Private residence Living Arrangements: Alone Available Help at Discharge: Family;Available PRN/intermittently Type of Home: House Home Access: Ramped entrance     Home Layout: One level Home Equipment: Tub bench;Walker - 4 wheels;Cane - single point;Wheelchair - manual      Prior Function Level of Independence: Needs assistance   Gait / Transfers Assistance Needed: reports could ambulate with rollator  ADL's / Homemaking Assistance Needed: Pt reports  independent with ADLs and IADLs  Comments: In regards to PLOF: questionable historian although per chart pt did live alone and was found in bath tub     Hand Dominance        Extremity/Trunk Assessment   Upper Extremity Assessment Upper Extremity Assessment: LUE deficits/detail;RUE deficits/detail RUE Deficits / Details: ROM  WFL: MMT 4/5 LUE Deficits / Details: ROM WFL: MMT 4/5    Lower Extremity Assessment Lower Extremity Assessment: LLE deficits/detail;RLE deficits/detail RLE Deficits / Details: ROM WFL: MMT 4/5 LLE Deficits / Details: ROM WFL: MMT 4/5    Cervical / Trunk Assessment Cervical / Trunk Assessment: Normal  Communication   Communication: No difficulties  Cognition Arousal/Alertness: Awake/alert Behavior During Therapy: Flat affect Overall Cognitive Status: Impaired/Different from baseline Area of Impairment: Orientation;Problem solving;Safety/judgement;Following commands                 Orientation Level: Disoriented to;Time;Situation (oriented to year only)     Following Commands: Follows one step commands with increased time     Problem Solving: Slow processing;Decreased initiation;Requires verbal cues;Requires tactile cues General Comments: Slow to respond; min communication (however improved since yesterday per RN and notes0      General Comments      Exercises     Assessment/Plan    PT Assessment Patient needs continued PT services  PT Problem List Decreased strength;Decreased safety awareness;Decreased mobility;Decreased range of motion;Decreased knowledge of precautions;Decreased activity tolerance;Decreased cognition;Decreased knowledge of use of DME;Decreased balance       PT Treatment Interventions DME instruction;Therapeutic activities;Gait training;Therapeutic exercise;Patient/family education;Balance training;Functional mobility training    PT Goals (Current goals can be found in the Care Plan section)  Acute Rehab PT Goals Patient Stated Goal: feel better PT Goal Formulation: With patient Time For Goal Achievement: 08/06/19 Potential to Achieve Goals: Good    Frequency Min 2X/week   Barriers to discharge Decreased caregiver support      Co-evaluation               AM-PAC PT "6 Clicks" Mobility  Outcome Measure Help needed turning from  your back to your side while in a flat bed without using bedrails?: A Little Help needed moving from lying on your back to sitting on the side of a flat bed without using bedrails?: A Little Help needed moving to and from a bed to a chair (including a wheelchair)?: A Little Help needed standing up from a chair using your arms (e.g., wheelchair or bedside chair)?: A Little Help needed to walk in hospital room?: A Little Help needed climbing 3-5 steps with a railing? : A Lot 6 Click Score: 17    End of Session Equipment Utilized During Treatment: Gait belt Activity Tolerance: Patient tolerated treatment well Patient left: with chair alarm set;in chair;with call bell/phone within reach Nurse Communication: Mobility status PT Visit Diagnosis: Unsteadiness on feet (R26.81);Muscle weakness (generalized) (M62.81)    Time: 4944-9675 PT Time Calculation (min) (ACUTE ONLY): 30 min   Charges:   PT Evaluation $PT Eval Moderate Complexity: 1 Mod PT Treatments $Therapeutic Activity: 8-22 mins        Abran Richard, PT Acute Rehab Services Pager [phone] [facility] [phone]    Karlton Lemon 07/23/2019, 10:05 AM

## 2019-07-23 NOTE — NC FL2 (Signed)
[place] LEVEL OF CARE SCREENING TOOL     IDENTIFICATION  Patient Name: Heather Petty Birthdate: 07/23/1950 Sex: female Admission Date (Current Location): 07/21/2019  [facility] and [place] Number:  Herbalist and Address:  [facility] [place] [address] [place],  [address]      Provider Number: [id number]  Attending Physician Name and Address:  Karie Kirks, DO  Relative Name and Phone Number:  [name], [place]    Current Level of Care:   Recommended Level of Care:   Prior Approval Number:    Date Approved/Denied:   PASRR Number:    Discharge Plan: SNF    Current Diagnoses: Patient Active Problem List   Diagnosis Date Noted  . DKA (diabetic ketoacidoses) ([place]) 07/22/2019  . Rhabdomyolysis 07/22/2019  . Acute metabolic encephalopathy 21/30/8657  . MRSA bacteremia 07/22/2019  . Enterococcal bacteremia 07/22/2019  . Vitiligo 07/22/2019  . 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 Sepsis ([place]) 07/19/2018  . Ventricular tachycardia, sustained ([place]) 07/19/2018  . NSTEMI (non-ST elevated myocardial infarction) ([place]) 07/19/2018  . Wound infection 04/03/2018  . Morbid obesity with BMI of 50.0-59.9, adult ([place]) 04/03/2018  . Elevated alkaline phosphatase level 04/03/2018  . Asthma exacerbation 03/14/2018  . Hypotension 03/14/2018  . Lactic acidosis 03/14/2018  . GERD (gastroesophageal reflux disease) 03/14/2018  . Acute renal failure superimposed on stage 3 chronic kidney disease ([place]) 03/14/2018  . Macrocytic anemia 03/14/2018  . Abnormal LFTs 03/14/2018  . Acute kidney injury ([place]) 02/28/2018  . Wound of sacral region 02/28/2018  . Hypothermia 02/28/2018  . Stage II pressure ulcer of sacral region ([place]) 02/15/2018  . DM II (diabetes mellitus, type II), controlled ([place]) 02/09/2018  . Schizophrenia ([place]) 02/09/2018  . Pressure ulcer 02/06/2018  . Acute renal failure (ARF) ([place]) 02/05/2018  . Hyperlipidemia  associated with type 2 diabetes mellitus ([place]) 02/05/2018  . Hyperkalemia 02/05/2018  . Dermatitis 02/05/2018  . Open back wound 02/05/2018  . Dysuria 02/05/2018  . Paranoid schizophrenia ([place])   . AKI (acute kidney injury) ([place]) 12/17/2017  . Type II diabetes mellitus with renal manifestations ([place]) 12/17/2017  . HTN (hypertension) 12/17/2017  . Gout     Orientation RESPIRATION BLADDER Height & Weight     Self, Time, Situation, Place  Normal Continent Weight: 242 lb 8.1 oz (110 kg) Height:  5' 1"  (154.9 cm)  BEHAVIORAL SYMPTOMS/MOOD NEUROLOGICAL BOWEL NUTRITION STATUS      Continent Diet (See discharge summary)  AMBULATORY STATUS COMMUNICATION OF NEEDS Skin   Limited Assist Verbally Normal                       Personal Care Assistance Level of Assistance  Bathing, Feeding, Dressing Bathing Assistance: Limited assistance Feeding assistance: Independent Dressing Assistance: Limited assistance     Functional Limitations Info  Sight, Hearing, Speech Sight Info: Adequate Hearing Info: Adequate Speech Info: Adequate    SPECIAL CARE FACTORS FREQUENCY  PT (By licensed PT), OT (By licensed OT)     PT Frequency: 5x week OT Frequency: 5x week            Contractures Contractures Info: Not present    Additional Factors Info  Code Status, Allergies Code Status Info: Full Allergies Info: Lasix (Furosemide)           Current Medications (07/23/2019):  This is the current hospital active medication list Current Facility-Administered Medications  Medication Dose Route Frequency Provider Last Rate  Last Admin  . acetaminophen (TYLENOL) tablet 650 mg  650 mg Oral Q6H PRN Shela Leff, MD   650 mg at 07/23/19 1217   Or  . acetaminophen (TYLENOL) suppository 650 mg  650 mg Rectal Q6H PRN Shela Leff, MD   650 mg at 07/22/19 2153  . enoxaparin (LOVENOX) injection 40 mg  40 mg Subcutaneous Q24H Shela Leff, MD   40 mg at 07/23/19 1218  . insulin  aspart (novoLOG) injection 0-15 Units  0-15 Units Subcutaneous TID WC Danford, Suann Larry, MD   8 Units at 07/23/19 1200  . insulin aspart (novoLOG) injection 0-5 Units  0-5 Units Subcutaneous QHS Danford, Christopher P, MD      . insulin glargine (LANTUS) injection 15 Units  15 Units Subcutaneous Q24H Danford, Christopher P, MD      . labetalol (NORMODYNE) injection 5 mg  5 mg Intravenous Q2H PRN Opyd, Ilene Qua, MD      . lactated ringers infusion   Intravenous Continuous Edwin Dada, MD 100 mL/hr at 07/22/19 1707 New Bag at 07/22/19 1707  . nystatin (MYCOSTATIN/NYSTOP) topical powder   Topical BID Shela Leff, MD   Given at 07/23/19 1452  . vancomycin (VANCOREADY) IVPB 1250 mg/250 mL  1,250 mg Intravenous Q24H Laren Everts, RPH 166.7 mL/hr at 07/22/19 2303 1,250 mg at 07/22/19 2303     Discharge Medications: Please see discharge summary for a list of discharge medications.  Relevant Imaging Results:  Relevant Lab Results:   Additional Information SS# [id number]- [place], Student-Social Work

## 2019-07-24 ENCOUNTER — Other Ambulatory Visit: Payer: Self-pay

## 2019-07-24 ENCOUNTER — Inpatient Hospital Stay (HOSPITAL_COMMUNITY): Payer: Medicare Other

## 2019-07-24 LAB — CULTURE, BLOOD (ROUTINE X 2): Special Requests: ADEQUATE

## 2019-07-24 LAB — CBC WITH DIFFERENTIAL/PLATELET
Abs Immature Granulocytes: 0.06 10*3/uL (ref 0.00–0.07)
Basophils Absolute: 0 10*3/uL (ref 0.0–0.1)
Basophils Relative: 0 %
Eosinophils Absolute: 0.4 10*3/uL (ref 0.0–0.5)
Eosinophils Relative: 4 %
HCT: 30.8 % — ABNORMAL LOW (ref 36.0–46.0)
Hemoglobin: 10.1 g/dL — ABNORMAL LOW (ref 12.0–15.0)
Immature Granulocytes: 1 %
Lymphocytes Relative: 10 %
Lymphs Abs: 0.9 10*3/uL (ref 0.7–4.0)
MCH: 30.1 pg (ref 26.0–34.0)
MCHC: 32.8 g/dL (ref 30.0–36.0)
MCV: 91.9 fL (ref 80.0–100.0)
Monocytes Absolute: 0.3 10*3/uL (ref 0.1–1.0)
Monocytes Relative: 4 %
Neutro Abs: 8 10*3/uL — ABNORMAL HIGH (ref 1.7–7.7)
Neutrophils Relative %: 81 %
Platelets: 119 10*3/uL — ABNORMAL LOW (ref 150–400)
RBC: 3.35 MIL/uL — ABNORMAL LOW (ref 3.87–5.11)
RDW: 13.2 % (ref 11.5–15.5)
WBC: 9.8 10*3/uL (ref 4.0–10.5)
nRBC: 0 % (ref 0.0–0.2)

## 2019-07-24 LAB — BASIC METABOLIC PANEL
Anion gap: 11 (ref 5–15)
BUN: 16 mg/dL (ref 8–23)
CO2: 22 mmol/L (ref 22–32)
Calcium: 8.1 mg/dL — ABNORMAL LOW (ref 8.9–10.3)
Chloride: 103 mmol/L (ref 98–111)
Creatinine, Ser: 0.73 mg/dL (ref 0.44–1.00)
GFR calc Af Amer: >60 mL/min (ref >60)
GFR calc non Af Amer: >60 mL/min (ref >60)
Glucose, Bld: 242 mg/dL — ABNORMAL HIGH (ref 70–99)
Potassium: 3 mmol/L — ABNORMAL LOW (ref 3.5–5.1)
Sodium: 136 mmol/L (ref 135–145)

## 2019-07-24 LAB — GLUCOSE, CAPILLARY
Glucose-Capillary: 219 mg/dL — ABNORMAL HIGH (ref 70–99)
Glucose-Capillary: 255 mg/dL — ABNORMAL HIGH (ref 70–99)
Glucose-Capillary: 305 mg/dL — ABNORMAL HIGH (ref 70–99)
Glucose-Capillary: 293 mg/dL — ABNORMAL HIGH (ref 70–99)

## 2019-07-24 LAB — MAGNESIUM: Magnesium: 2.7 mg/dL — ABNORMAL HIGH (ref 1.7–2.4)

## 2019-07-24 LAB — PHOSPHORUS: Phosphorus: 1.7 mg/dL — ABNORMAL LOW (ref 2.5–4.6)

## 2019-07-24 MED ORDER — INSULIN ASPART 100 UNIT/ML ~~LOC~~ SOLN
3.0000 [IU] | Freq: Three times a day (TID) | SUBCUTANEOUS | Status: DC
  Administered 2019-07-24 – 2019-07-28 (×10): 3 [IU] via SUBCUTANEOUS

## 2019-07-24 MED ORDER — VANCOMYCIN HCL 750 MG/150ML IV SOLN
750.0000 mg | Freq: Two times a day (BID) | INTRAVENOUS | Status: DC
  Administered 2019-07-24 – 2019-07-27 (×6): 750 mg via INTRAVENOUS
  Filled 2019-07-24 (×7): qty 150

## 2019-07-24 MED ORDER — INSULIN GLARGINE 100 UNIT/ML ~~LOC~~ SOLN
20.0000 [IU] | SUBCUTANEOUS | Status: DC
  Administered 2019-07-24: 20 [IU] via SUBCUTANEOUS
  Filled 2019-07-24 (×2): qty 0.2

## 2019-07-24 MED ORDER — POTASSIUM PHOSPHATES 15 MMOLE/5ML IV SOLN
30.0000 mmol | Freq: Once | INTRAVENOUS | Status: AC
  Administered 2019-07-24: 30 mmol via INTRAVENOUS
  Filled 2019-07-24: qty 10

## 2019-07-24 NOTE — Progress Notes (Addendum)
PROGRESS NOTE  Heather Petty TGY:[id number] DOB: 07-09-1950 DOA: 07/21/2019 PCP: [name], MD  Brief History   Heather Petty is a 69 y.o. F with hx HTN, IDDM, obesity, hx CVA, schizophrenia/depression and asthma who presented with acute AMS.  Rreportedly found by her daughter slumped over in her bathtub, family had last spoken to her 2 days ago.  EMS activated, found CBG 550, tachycardic.  In the ER, temp 103F, tachypneic and tachycardic.  Lactate 3.2, Cr 1.4 from baseline 0.9, CK 695.  CT head, chest abdomen and pelvis notable only for ulceration and fat stranding over left iliac.  Given fluids, IV insulin and empiric antibiotics and admitted to hospitalist service. DKA resolved, and CK has diminished.  The patient was transferred [facility] to [facility] on 07/22/2019 for further evaluation.   Blood cultures x 2 have grown out MRSA. One of them has grown out enterococcus and MRSA. Urine culture has grown out Klebsiella. Infectious disease has been consulted. She is receiving IV Vancomycin. Echocardiogram has demonstrated an EF of 65-70%. LV normal function with mild concentric left ventricular hypertrophy. There is evidence of Grade I diastolic dysfunction. RV systolic function and size are normal. Indeterminate number of Aortic valve cusps are present. No aortic stenosis. There is no evidence of valvular vegetations. TEE is recommended. I have ordered this.  Surveillance cultures drawn on 07/22/2019 have again grown out GPC. They have been repeated.   Consultants  . Infectious disease . Wound care  Procedures  . None  Antibiotics   Anti-infectives (From admission, onward)   Start     Dose/Rate Route Frequency Ordered Stop   07/24/19 1200  vancomycin (VANCOREADY) IVPB 750 mg/150 mL     Discontinue     750 mg 150 mL/hr over 60 Minutes Intravenous Every 12 hours 07/24/19 0925     07/23/19 0000  vancomycin (VANCOREADY) IVPB 1250 mg/250 mL  Status:  Discontinued        1,250  mg 166.7 mL/hr over 90 Minutes Intravenous Every 24 hours 07/22/19 0252 07/24/19 0925   07/22/19 1000  ceFEPIme (MAXIPIME) 2 g in sodium chloride 0.9 % 100 mL IVPB  Status:  Discontinued        2 g 200 mL/hr over 30 Minutes Intravenous Every 12 hours 07/22/19 0252 07/22/19 1636   07/22/19 0600  metroNIDAZOLE (FLAGYL) IVPB 500 mg  Status:  Discontinued        500 mg 100 mL/hr over 60 Minutes Intravenous Every 8 hours 07/22/19 0240 07/22/19 1636   07/21/19 2200  ceFEPIme (MAXIPIME) 2 g in sodium chloride 0.9 % 100 mL IVPB        2 g 200 mL/hr over 30 Minutes Intravenous  Once 07/21/19 2148 07/21/19 2254   07/21/19 2200  metroNIDAZOLE (FLAGYL) IVPB 500 mg        500 mg 100 mL/hr over 60 Minutes Intravenous  Once 07/21/19 2148 07/21/19 2302   07/21/19 2200  vancomycin (VANCOCIN) IVPB 1000 mg/200 mL premix  Status:  Discontinued        1,000 mg 200 mL/hr over 60 Minutes Intravenous  Once 07/21/19 2148 07/21/19 2153   07/21/19 2200  vancomycin (VANCOCIN) 2,250 mg in sodium chloride 0.9 % 500 mL IVPB        2,250 mg 250 mL/hr over 120 Minutes Intravenous  Once 07/21/19 2153 07/22/19 0058     Subjective  The patient is resting comfortably. She is complaining of pain in her right shoulder.  Objective   Vitals:  Vitals:   07/24/19 0723 07/24/19 1143  BP: 136/65 (!) 118/55  Pulse: 81 86  Resp: 18 20  Temp: 97.9 F (36.6 C) 99.3 F (37.4 C)  SpO2: 94% 93%   Exam:  Constitutional:  . The patient is awake, alert, and oriented x 3. No acute distress. Respiratory:  . No increased work of breathing. . No wheezes, rales, or rhonchi . No tactile fremitus Cardiovascular:  . Regular rate and rhythm . No murmurs, ectopy, or gallups. . No lateral PMI. No thrills. Abdomen:  . Abdomen is soft, non-tender, non-distended . No hernias, masses, or organomegaly . Normoactive bowel sounds.  Musculoskeletal:  . No cyanosis, clubbing, or edema . Pain in right upper extremity and  shoulder. Skin:  . No rashes, lesions, ulcers . palpation of skin: no induration or nodules Neurologic:  . CN 2-12 intact . Sensation all 4 extremities intact Psychiatric:  . Mental status o Mood, affect appropriate o Orientation to person, place, time  . judgment and insight appear intact I have personally reviewed the following:   Today's Data  . Vitals, BMP, CBC, CK, Glucoses,  . Echocardiogram  Micro Data  . Blood Cultures x 2 (07/21/2019) . Urine culture (07/21/2019) . Blood Cultures x 2 (07/22/2019)  Scheduled Meds: . enoxaparin (LOVENOX) injection  40 mg Subcutaneous Q24H  . insulin aspart  0-15 Units Subcutaneous TID WC  . insulin aspart  0-5 Units Subcutaneous QHS  . insulin aspart  3 Units Subcutaneous TID WC  . insulin glargine  20 Units Subcutaneous Q24H  . nystatin   Topical BID   Continuous Infusions: . lactated ringers 100 mL/hr at 07/24/19 0920  . vancomycin 750 mg (07/24/19 1348)    Principal Problem:   MRSA bacteremia Active Problems:   AKI (acute kidney injury) ([place])   Type II diabetes mellitus with renal manifestations ([place])   HTN (hypertension)   Paranoid schizophrenia ([place])   Hyperlipidemia associated with type 2 diabetes mellitus ([facility])   Pressure ulcer   Sepsis ([place])   Long Q-T syndrome   DKA (diabetic ketoacidoses) ([facility])   Rhabdomyolysis   Acute metabolic encephalopathy   Enterococcal bacteremia   Vitiligo   LOS: 2 days   A & P  Sepsis: Resolved. Due to MRSA and enterococcal bacteremia. The patient presented with fever, tachycardia, lactic acidemia and encephalopathy/AKI in setting of probably infected sacral ulcer, otherwise source unclear. Encephalopathy appears to have cleared.  MRSA and Enterococcal Bacteremia: Likely as a result of UTI and sacral decubitus ulcer. The patient is currently on vancomycin. I appreciate infectious disease's assistance. Echocardiogram has demonstrated an EF of 65-70%. LV normal function with mild  concentric left ventricular hypertrophy. There is evidence of Grade I diastolic dysfunction. RV systolic function and size are normal. Indeterminate number of Aortic valve cusps are present. No aortic stenosis. There is no evidence of valvular vegetations. TEE is recommended. I have ordered this. Surveillance cultures drawn on 07/22/2019 have again grown out GPC. They have been repeated. The patient is receiving Vancomycin and Zyvox.  DKA: Resolved. Multifactorial. Acute infection/bacteremia in setting of dehydration and lack of access to insulin for 1-2 days while she was down. The patient is currently receiving Lantus 15 units daily with FSBS and SSI.   Rhabdomyolysis: Resolving. Mild, likely too low to be cause of AKI, which is more likely from prerenal injury in sepsis. Monitor and continue IV fluids until CK is normalized.  Hypokalemia/Hypophosphatemia: Supplement and monitor.  AKI: Resolved. Creatinine was 1.42 on admission.  Creatinine 0.73 this morning after IV fluids. Continue to monitor creatinine, electrolytes, and volume status.   Acute metabolic encephalopathy: Resolved. Continue to hold Risperdal, gabapentin for now.  Stage II sacral pressure ulcer (POA): Wound care consulted. Likely source of MRSA bacteremia.   Hypertension: The patient is normotensive despite lasix, nadolol, amlodipine, and benazapril being held. Hold Lasix, nadolol, amlodippine, and benazepril being held.  Asthma: Noted. Stable and quiescent.  Schizoaffective disorder: Hold Risperdal for now  I have seen and examined this patient myself. I have spent 32 minutes in her evaluation and care.  DVT prophylaxis: Lovenox CODE STATUS: Full Code FAMILY Communication: I have discussed the patient in detail with the patient's daughter Margreta Journey. All questions answered to the best of my ability. Disposition: TBD. Barriers to discharge includes continued growth of GPC from surveillance cultures and need for  TEE. Status is: Inpatient  Remains inpatient appropriate because: Inpatient level of care appropriate due to severity of illness.   Dispo: The patient is from: Home  Anticipated d/c is to: TBD  Anticipated d/c date is: 3 days  Patient currently is not medically stable to d/c. Barriers to discharge includes continued growth of GPC from surveillance cultures and need for TEE.  Leilany Digeronimo, DO [facility] Direct contact: see [email or web address]  7PM-7AM contact night coverage as above 07/24/2019, 2:39 PM  LOS: 1 day

## 2019-07-24 NOTE — Progress Notes (Signed)
Patient ID: Heather Petty, female   DOB: [birth year], 69 y.o.   MRN: [id number]         [facility]ious Disease  Date of Admission:  07/21/2019           Day 4 vancomycin ASSESSMENT: She is improving slowly on therapy for MRSA and enterococcal bacteremia.  Repeat blood cultures are growing gram-positive cocci again.  We will repeat blood cultures today and continue vancomycin.  I recommend TEE  PLAN: 1. Continue vancomycin 2. Repeat blood cultures 3. Commend TEE  Principal Problem:   MRSA bacteremia Active Problems:   Sepsis ([place])   Enterococcal bacteremia   AKI (acute kidney injury) ([place])   Type II diabetes mellitus with renal manifestations ([place])   HTN (hypertension)   Paranoid schizophrenia ([place])   Hyperlipidemia associated with type 2 diabetes mellitus ([place])   Pressure ulcer   Long Q-T syndrome   DKA (diabetic ketoacidoses) ([facility])   Rhabdomyolysis   Acute metabolic encephalopathy   Vitiligo   Scheduled Meds: . enoxaparin (LOVENOX) injection  40 mg Subcutaneous Q24H  . insulin aspart  0-15 Units Subcutaneous TID WC  . insulin aspart  0-5 Units Subcutaneous QHS  . insulin glargine  15 Units Subcutaneous Q24H  . nystatin   Topical BID   Continuous Infusions: . lactated ringers 100 mL/hr at 07/24/19 0920  . vancomycin     PRN Meds:.acetaminophen **OR** acetaminophen, ALPRAZolam, ibuprofen, labetalol   SUBJECTIVE: She tells me that her daughter said she was found in her bathtub and that she seemed to have fallen and broken the drying rack.  She says she now has pain in her right shoulder.  Review of Systems: Review of Systems  Unable to perform ROS: Mental acuity    Allergies  Allergen Reactions  . Lasix [Furosemide] Other (See Comments)    IV-LASIX ==> Reaction: Burning    OBJECTIVE: Vitals:   07/23/19 2329 07/24/19 0039 07/24/19 0305 07/24/19 0723  BP: (!) 124/54  (!) 122/56 136/65  Pulse: 87  74 81  Resp: 16 (!) 21 16 18   Temp:  99.1 F (37.3 C) 99.2 F (37.3 C) 99 F (37.2 C) 97.9 F (36.6 C)  TempSrc: Oral  Oral Oral  SpO2: 98%  95% 94%  Weight:      Height:       Body mass index is 45.82 kg/m.  Physical Exam Constitutional:      Comments: She is more alert but still somewhat confused.  Cardiovascular:     Rate and Rhythm: Normal rate and regular rhythm.     Heart sounds: No murmur heard.   Pulmonary:     Effort: Pulmonary effort is normal.     Breath sounds: Normal breath sounds.  Abdominal:     Palpations: Abdomen is soft.     Tenderness: There is no abdominal tenderness.  Musculoskeletal:     Cervical back: Neck supple.     Comments: She has some discomfort when raising her right arm but no unusual warmth, redness or swelling of her shoulder.  Skin:    Findings: No rash.     Lab Results Lab Results  Component Value Date   WBC 9.8 07/24/2019   HGB 10.1 (L) 07/24/2019   HCT 30.8 (L) 07/24/2019   MCV 91.9 07/24/2019   PLT 119 (L) 07/24/2019    Lab Results  Component Value Date   CREATININE 0.73 07/24/2019   BUN 16 07/24/2019   NA 136 07/24/2019   K 3.0 (  L) 07/24/2019   CL 103 07/24/2019   CO2 22 07/24/2019    Lab Results  Component Value Date   ALT 34 07/23/2019   AST 53 (H) 07/23/2019   ALKPHOS 125 07/23/2019   BILITOT 1.6 (H) 07/23/2019     Microbiology: Recent Results (from the past 240 hour(s))  Culture, blood (Routine x 2)     Status: Abnormal (Preliminary result)   Collection Time: 07/21/19 10:01 PM   Specimen: BLOOD RIGHT HAND  Result Value Ref Range Status   Specimen Description BLOOD RIGHT HAND  Final   Special Requests   Final    BOTTLES DRAWN AEROBIC ONLY Blood Culture adequate volume   Culture  Setup Time   Final    GRAM POSITIVE COCCI IN CHAINS IN CLUSTERS AEROBIC BOTTLE ONLY CRITICAL RESULT CALLED TO, READ BACK BY AND VERIFIED WITH: Jene Every PHARMD 1505 07/22/19 A BROWNING Performed at [facility], [place] [address] [place], [place] [address]     Culture STAPHYLOCOCCUS AUREUS GRAM POSITIVE COCCI  (A)  Final   Report Status PENDING  Incomplete  Blood Culture ID Panel (Reflexed)     Status: Abnormal   Collection Time: 07/21/19 10:01 PM  Result Value Ref Range Status   Enterococcus species DETECTED (A) NOT DETECTED Final    Comment: CRITICAL RESULT CALLED TO, READ BACK BY AND VERIFIED WITH: Jene Every PHARMD 1505 07/22/19 A BROWNING    Vancomycin resistance NOT DETECTED NOT DETECTED Final   Listeria monocytogenes NOT DETECTED NOT DETECTED Final   Staphylococcus species DETECTED (A) NOT DETECTED Final    Comment: CRITICAL RESULT CALLED TO, READ BACK BY AND VERIFIED WITH: Jene Every PHARMD 1505 07/22/19 A BROWNING    Staphylococcus aureus (BCID) DETECTED (A) NOT DETECTED Final    Comment: Methicillin (oxacillin)-resistant Staphylococcus aureus (MRSA). MRSA is predictably resistant to beta-lactam antibiotics (except ceftaroline). Preferred therapy is vancomycin unless clinically contraindicated. Patient requires contact precautions if  hospitalized. CRITICAL RESULT CALLED TO, READ BACK BY AND VERIFIED WITH: Jene Every PHARMD 1505 07/22/19 A BROWNING    Methicillin resistance DETECTED (A) NOT DETECTED Final    Comment: CRITICAL RESULT CALLED TO, READ BACK BY AND VERIFIED WITH: Jene Every PHARMD 1505 07/22/19 A BROWNING    Streptococcus species NOT DETECTED NOT DETECTED Final   Streptococcus agalactiae NOT DETECTED NOT DETECTED Final   Streptococcus pneumoniae NOT DETECTED NOT DETECTED Final   Streptococcus pyogenes NOT DETECTED NOT DETECTED Final   Acinetobacter baumannii NOT DETECTED NOT DETECTED Final   Enterobacteriaceae species NOT DETECTED NOT DETECTED Final   Enterobacter cloacae complex NOT DETECTED NOT DETECTED Final   Escherichia coli NOT DETECTED NOT DETECTED Final   Klebsiella oxytoca NOT DETECTED NOT DETECTED Final   Klebsiella pneumoniae NOT DETECTED NOT DETECTED Final   Proteus species NOT DETECTED NOT DETECTED Final   Serratia  marcescens NOT DETECTED NOT DETECTED Final   Haemophilus influenzae NOT DETECTED NOT DETECTED Final   Neisseria meningitidis NOT DETECTED NOT DETECTED Final   Pseudomonas aeruginosa NOT DETECTED NOT DETECTED Final   Candida albicans NOT DETECTED NOT DETECTED Final   Candida glabrata NOT DETECTED NOT DETECTED Final   Candida krusei NOT DETECTED NOT DETECTED Final   Candida parapsilosis NOT DETECTED NOT DETECTED Final   Candida tropicalis NOT DETECTED NOT DETECTED Final    Comment: Performed at [facility], [place]. [address], [place],  [address]  Culture, blood (Routine x 2)     Status: Abnormal   Collection Time: 07/21/19  10:05 PM   Specimen: BLOOD RIGHT FOREARM  Result Value Ref Range Status   Specimen Description BLOOD RIGHT FOREARM  Final   Special Requests   Final    BOTTLES DRAWN AEROBIC ONLY Blood Culture adequate volume   Culture  Setup Time   Final    GRAM POSITIVE COCCI IN CLUSTERS AEROBIC BOTTLE ONLY CRITICAL VALUE NOTED.  VALUE IS CONSISTENT WITH PREVIOUSLY REPORTED AND CALLED VALUE. Performed at [facility], [place] [address] [place], [place] [address]    Culture METHICILLIN RESISTANT STAPHYLOCOCCUS AUREUS (A)  Final   Report Status 07/24/2019 FINAL  Final   Organism ID, Bacteria METHICILLIN RESISTANT STAPHYLOCOCCUS AUREUS  Final      Susceptibility   Methicillin resistant staphylococcus aureus - MIC*    CIPROFLOXACIN >=8 RESISTANT Resistant     ERYTHROMYCIN >=8 RESISTANT Resistant     GENTAMICIN <=0.5 SENSITIVE Sensitive     OXACILLIN >=4 RESISTANT Resistant     TETRACYCLINE <=1 SENSITIVE Sensitive     VANCOMYCIN <=0.5 SENSITIVE Sensitive     TRIMETH/SULFA <=10 SENSITIVE Sensitive     CLINDAMYCIN <=0.25 SENSITIVE Sensitive     RIFAMPIN <=0.5 SENSITIVE Sensitive     Inducible Clindamycin NEGATIVE Sensitive     * METHICILLIN RESISTANT STAPHYLOCOCCUS AUREUS  Urine culture     Status: Abnormal   Collection Time: 07/21/19 10:07 PM   Specimen: Urine,  Catheterized  Result Value Ref Range Status   Specimen Description URINE, CATHETERIZED  Final   Special Requests   Final    NONE Performed at [facility], [place] [address] [place], [place] [address]    Culture 80,000 COLONIES/mL KLEBSIELLA PNEUMONIAE (A)  Final   Report Status 07/23/2019 FINAL  Final   Organism ID, Bacteria KLEBSIELLA PNEUMONIAE (A)  Final      Susceptibility   Klebsiella pneumoniae - MIC*    AMPICILLIN >=32 RESISTANT Resistant     CEFAZOLIN <=4 SENSITIVE Sensitive     CEFTRIAXONE <=0.25 SENSITIVE Sensitive     CIPROFLOXACIN <=0.25 SENSITIVE Sensitive     GENTAMICIN <=1 SENSITIVE Sensitive     IMIPENEM <=0.25 SENSITIVE Sensitive     NITROFURANTOIN 32 SENSITIVE Sensitive     TRIMETH/SULFA <=20 SENSITIVE Sensitive     AMPICILLIN/SULBACTAM 4 SENSITIVE Sensitive     PIP/TAZO <=4 SENSITIVE Sensitive     * 80,000 COLONIES/mL KLEBSIELLA PNEUMONIAE  SARS Coronavirus 2 by RT PCR (hospital order, performed in [facility] hospital lab) Nasopharyngeal Nasopharyngeal Swab     Status: None   Collection Time: 07/21/19 10:17 PM   Specimen: Nasopharyngeal Swab  Result Value Ref Range Status   SARS Coronavirus 2 NEGATIVE NEGATIVE Final    Comment: (NOTE) SARS-CoV-2 target nucleic acids are NOT DETECTED.  The SARS-CoV-2 RNA is generally detectable in upper and lower respiratory specimens during the acute phase of infection. The lowest concentration of SARS-CoV-2 viral copies this assay can detect is 250 copies / mL. A negative result does not preclude SARS-CoV-2 infection and should not be used as the sole basis for treatment or other patient management decisions.  A negative result may occur with improper specimen collection / handling, submission of specimen other than nasopharyngeal swab, presence of viral mutation(s) within the areas targeted by this assay, and inadequate number of viral copies (<250 copies / mL). A negative result must be combined with  clinical observations, patient history, and epidemiological information.  Fact Sheet for Patients:   StrictlyIdeas.no  Fact Sheet for Healthcare Providers: BankingDealers.co.za  This test is not yet approved or  cleared by the [place] and has been authorized for detection and/or diagnosis of SARS-CoV-2 by FDA under an Emergency Use Authorization (EUA).  This EUA will remain in effect (meaning this test can be used) for the duration of the COVID-19 declaration under Section 564(b)(1) of the Act, 21 U.S.C. section 360bbb-3(b)(1), unless the authorization is terminated or revoked sooner.  Performed at [facility], [place] [address] [place], [place] [address]   Respiratory Panel by PCR     Status: None   Collection Time: 07/22/19 12:58 AM   Specimen: Nasopharyngeal Swab; Respiratory  Result Value Ref Range Status   Adenovirus NOT DETECTED NOT DETECTED Final   Coronavirus 229E NOT DETECTED NOT DETECTED Final    Comment: (NOTE) The Coronavirus on the Respiratory Panel, DOES NOT test for the novel  Coronavirus (2019 nCoV)    Coronavirus HKU1 NOT DETECTED NOT DETECTED Final   Coronavirus NL63 NOT DETECTED NOT DETECTED Final   Coronavirus OC43 NOT DETECTED NOT DETECTED Final   Metapneumovirus NOT DETECTED NOT DETECTED Final   Rhinovirus / Enterovirus NOT DETECTED NOT DETECTED Final   Influenza A NOT DETECTED NOT DETECTED Final   Influenza B NOT DETECTED NOT DETECTED Final   Parainfluenza Virus 1 NOT DETECTED NOT DETECTED Final   Parainfluenza Virus 2 NOT DETECTED NOT DETECTED Final   Parainfluenza Virus 3 NOT DETECTED NOT DETECTED Final   Parainfluenza Virus 4 NOT DETECTED NOT DETECTED Final   Respiratory Syncytial Virus NOT DETECTED NOT DETECTED Final   Bordetella pertussis NOT DETECTED NOT DETECTED Final   Chlamydophila pneumoniae NOT DETECTED NOT DETECTED Final   Mycoplasma pneumoniae NOT DETECTED NOT DETECTED Final     Comment: Performed at [facility], [place]. [address] [place], [place] [address]  Culture, blood (routine x 2)     Status: Abnormal (Preliminary result)   Collection Time: 07/22/19  8:11 PM   Specimen: BLOOD  Result Value Ref Range Status   Specimen Description BLOOD RIGHT ANTECUBITAL  Final   Special Requests   Final    BOTTLES DRAWN AEROBIC ONLY Blood Culture results may not be optimal due to an inadequate volume of blood received in culture bottles   Culture  Setup Time   Final    GRAM POSITIVE COCCI IN CLUSTERS AEROBIC BOTTLE ONLY CRITICAL RESULT CALLED TO, READ BACK BY AND VERIFIED WITH: J. FRENS PHrmd, at 1353 07/23/19 by d. vanhook Performed at [facility], [place] [address] [place], [place] [address]    Culture STAPHYLOCOCCUS AUREUS (A)  Final   Report Status PENDING  Incomplete  Culture, blood (routine x 2)     Status: Abnormal (Preliminary result)   Collection Time: 07/22/19  8:26 PM   Specimen: BLOOD LEFT HAND  Result Value Ref Range Status   Specimen Description BLOOD LEFT HAND  Final   Special Requests   Final    BOTTLES DRAWN AEROBIC ONLY Blood Culture results may not be optimal due to an inadequate volume of blood received in culture bottles   Culture  Setup Time   Final    GRAM POSITIVE COCCI IN CLUSTERS AEROBIC BOTTLE ONLY CRITICAL VALUE NOTED.  VALUE IS CONSISTENT WITH PREVIOUSLY REPORTED AND CALLED VALUE. Performed at [facility], [place] [address] [place], [place] [address]    Culture STAPHYLOCOCCUS AUREUS (A)  Final   Report Status PENDING  Incomplete    Michel Bickers, MD [facility] for Infectious  Disease [facility] [phone] pager   [phone] cell 07/24/2019, 10:50 AM

## 2019-07-24 NOTE — Progress Notes (Signed)
Physical Therapy Treatment Patient Details Name: Heather Petty MRN: [id number] DOB: 05-09-1950 Today's Date: 07/24/2019    History of Present Illness Pt is a 68 y.o. F with hx HTN, IDDM, obesity, hx CVA, schizophrenia/depression and asthma who presented with acute AMS. Reportedly found by her daughter slumped over in her bathtub, family had last spoken to her 2 days ago.  Pt admitted with sepsis and mild rhabdo.    PT Comments    Pt remains very limited overall secondary to weakness and fatigue. She continues to require min A for bed mobility and transfers. Unable to tolerate gait training this session secondary to fatigue. Pt would continue to benefit from skilled physical therapy services at this time while admitted and after d/c to address the below listed limitations in order to improve overall safety and independence with functional mobility.    Follow Up Recommendations  SNF;Supervision/Assistance - 24 hour     Equipment Recommendations  None recommended by PT    Recommendations for Other Services       Precautions / Restrictions Precautions Precautions: Fall Restrictions Weight Bearing Restrictions: No    Mobility  Bed Mobility Overal bed mobility: Needs Assistance Bed Mobility: Sit to Supine     Supine to sit: Min assist;HOB elevated Sit to supine: Min assist   General bed mobility comments: assistance needed to return bilateral LEs onto bed  Transfers Overall transfer level: Needs assistance Equipment used: 1 person hand held assist Transfers: Sit to/from [facility] Sit to Stand: Min assist Stand pivot transfers: Min assist       General transfer comment: increased time and effort, cueing for safety and sequencing, min A for stability and support  Ambulation/Gait             General Gait Details: uanble to tolerate secondary to fatigue   Stairs             Wheelchair Mobility    Modified Rankin (Stroke Patients  Only)       Balance Overall balance assessment: Needs assistance Sitting-balance support: No upper extremity supported;Feet supported Sitting balance-Leahy Scale: Fair Sitting balance - Comments: did not challenge balance   Standing balance support: Single extremity supported;During functional activity Standing balance-Leahy Scale: Poor Standing balance comment: reliant on UE support                            Cognition Arousal/Alertness: Awake/alert Behavior During Therapy: Flat affect Overall Cognitive Status: No family/caregiver present to determine baseline cognitive functioning Area of Impairment: Problem solving;Safety/judgement                   Current Attention Level: Sustained Memory: Decreased short-term memory Following Commands: Follows one step commands with increased time Safety/Judgement: Decreased awareness of safety;Decreased awareness of deficits   Problem Solving: Slow processing;Decreased initiation;Difficulty sequencing;Requires verbal cues General Comments: pt requires cues for safety;pt requires increased time for processing;pt with short term memory limitations but able to recall events of morning       Exercises      General Comments        Pertinent Vitals/Pain Pain Assessment: Faces Faces Pain Scale: Hurts even more Pain Location: buttocks  Pain Descriptors / Indicators: Grimacing;Guarding Pain Intervention(s): Monitored during session;Repositioned    Home Living Family/patient expects to be discharged to:: Private residence Living Arrangements: Alone Available Help at Discharge: Family;Available PRN/intermittently Type of Home: House Home Access: Ramped entrance   Home Layout: One  level Home Equipment: Tub bench;Walker - 4 wheels;Cane - single point;Wheelchair - manual      Prior Function Level of Independence: Needs assistance  Gait / Transfers Assistance Needed: reports could ambulate with rollator ADL's /  Homemaking Assistance Needed: Pt reports independent with ADLs and IADLs but reports it is very difficult to don/doff LB clothing Comments: In regards to PLOF: questionable historian although per chart pt did live alone and was found in bath tub   PT Goals (current goals can now be found in the care plan section) Acute Rehab PT Goals Patient Stated Goal: feel better PT Goal Formulation: With patient Time For Goal Achievement: 08/06/19 Potential to Achieve Goals: Good Progress towards PT goals: Progressing toward goals    Frequency    Min 2X/week      PT Plan Current plan remains appropriate    Co-evaluation              AM-PAC PT "6 Clicks" Mobility   Outcome Measure  Help needed turning from your back to your side while in a flat bed without using bedrails?: A Little Help needed moving from lying on your back to sitting on the side of a flat bed without using bedrails?: A Little Help needed moving to and from a bed to a chair (including a wheelchair)?: A Little Help needed standing up from a chair using your arms (e.g., wheelchair or bedside chair)?: A Little Help needed to walk in hospital room?: A Little Help needed climbing 3-5 steps with a railing? : A Lot 6 Click Score: 17    End of Session   Activity Tolerance: Patient limited by fatigue Patient left: in bed;with call bell/phone within reach;with bed alarm set Nurse Communication: Mobility status PT Visit Diagnosis: Unsteadiness on feet (R26.81);Muscle weakness (generalized) (M62.81)     Time: 2119-4174 PT Time Calculation (min) (ACUTE ONLY): 11 min  Charges:  $Therapeutic Activity: 8-22 mins                     Anastasio Champion, DPT  Acute Rehabilitation Services Pager [phone] Office [place] 07/24/2019, 3:05 PM

## 2019-07-24 NOTE — Evaluation (Signed)
Occupational Therapy Evaluation Patient Details Name: Heather Petty MRN: [id number] DOB: Jun 17, 1950 Today's Date: 07/24/2019    History of Present Illness Pt is a 69 y.o. F with hx HTN, IDDM, obesity, hx CVA, schizophrenia/depression and asthma who presented with acute AMS. Reportedly found by her daughter slumped over in her bathtub, family had last spoken to her 2 days ago.  Pt admitted with sepsis and mild rhabdo.   Clinical Impression   PTA, pt was living at home alone in one story home with a ramped entrance, she reports she was independent with ADL/IADL but had a very difficult time with LB dressing. PT reports she was modified independent with functional mobility at rollator level. Pt currently requires minA for stand-pivot transfer to toilet, she required assistance for posterior care and modA to take three steps and pivot to recliner. Due to decline in current level of function, pt would benefit from acute OT to address established goals to facilitate safe D/C to venue listed below. At this time, recommend SNF follow-up. Will continue to follow acutely.     Follow Up Recommendations  SNF;Supervision/Assistance - 24 hour    Equipment Recommendations  3 in 1 bedside commode    Recommendations for Other Services       Precautions / Restrictions Precautions Precautions: Fall      Mobility Bed Mobility Overal bed mobility: Needs Assistance Bed Mobility: Supine to Sit     Supine to sit: Min assist;HOB elevated     General bed mobility comments: minA to progress trunk to upright posture  Transfers Overall transfer level: Needs assistance Equipment used: 1 person hand held assist (and armrest bsc/recliner) Transfers: Sit to/from Stand;Stand Pivot Transfers Sit to Stand: Min assist Stand pivot transfers: Min assist       General transfer comment: increased time and cues for safety;pt minA to pivot from EOB to recliner    Balance Overall balance assessment: Needs  assistance Sitting-balance support: No upper extremity supported;Feet supported Sitting balance-Leahy Scale: Fair Sitting balance - Comments: did not challenge balance   Standing balance support: Single extremity supported;During functional activity Standing balance-Leahy Scale: Fair Standing balance comment: reliant on UE support                           ADL either performed or assessed with clinical judgement   ADL Overall ADL's : Needs assistance/impaired Eating/Feeding: Set up;Sitting Eating/Feeding Details (indicate cue type and reason): provided pt with lunch in recliner Grooming: Set up;Sitting   Upper Body Bathing: Min guard;Sitting   Lower Body Bathing: Minimal assistance;Sit to/from stand   Upper Body Dressing : Min guard;Sitting   Lower Body Dressing: Minimal assistance;Sit to/from stand Lower Body Dressing Details (indicate cue type and reason): pt would benefit from use of AE to assist with LB dressing Toilet Transfer: Minimal assistance;Moderate assistance;Stand-pivot Toilet Transfer Details (indicate cue type and reason): minA to stand pivot from EOB to BSC;pt had BM and urniated;pt required modA to take 3 steps and pivot to recliner Toileting- Clothing Manipulation and Hygiene: Moderate assistance;Sit to/from stand Toileting - Clothing Manipulation Details (indicate cue type and reason): assist for posterior care     Functional mobility during ADLs: Minimal assistance General ADL Comments: pt limited by generalized weakness, decreased activity tolerance, and cognition     Vision Patient Visual Report: No change from baseline       Perception     Praxis      Pertinent Vitals/Pain Pain Assessment:  Faces Faces Pain Scale: Hurts little more Pain Location: buttocks  Pain Descriptors / Indicators: Grimacing;Guarding Pain Intervention(s): Limited activity within patient's tolerance;Monitored during session     Hand Dominance Right    Extremity/Trunk Assessment Upper Extremity Assessment Upper Extremity Assessment: LUE deficits/detail;RUE deficits/detail RUE Deficits / Details: ROM WFL: MMT 4/5 RUE Sensation: WNL RUE Coordination: WNL LUE Deficits / Details: ROM WFL: MMT 4/5 LUE Sensation: WNL LUE Coordination: WNL   Lower Extremity Assessment Lower Extremity Assessment: Defer to PT evaluation   Cervical / Trunk Assessment Cervical / Trunk Assessment: Normal   Communication Communication Communication: No difficulties   Cognition Arousal/Alertness: Awake/alert Behavior During Therapy: Flat affect Overall Cognitive Status: No family/caregiver present to determine baseline cognitive functioning Area of Impairment: Attention;Memory;Following commands;Safety/judgement                   Current Attention Level: Sustained Memory: Decreased short-term memory Following Commands: Follows one step commands with increased time Safety/Judgement: Decreased awareness of safety   Problem Solving: Slow processing;Decreased initiation;Requires verbal cues;Requires tactile cues General Comments: pt requires cues for safety;pt requires increased time for processing;pt with short term memory limitations but able to recall events of morning    General Comments       Exercises     Shoulder Instructions      Home Living Family/patient expects to be discharged to:: Private residence Living Arrangements: Alone Available Help at Discharge: Family;Available PRN/intermittently Type of Home: House Home Access: Ramped entrance     Home Layout: One level     Bathroom Shower/Tub: Occupational psychologist: Standard Bathroom Accessibility: Yes   Home Equipment: Tub bench;Walker - 4 wheels;Cane - single point;Wheelchair - manual          Prior Functioning/Environment Level of Independence: Needs assistance  Gait / Transfers Assistance Needed: reports could ambulate with rollator ADL's / Homemaking  Assistance Needed: Pt reports independent with ADLs and IADLs but reports it is very difficult to don/doff LB clothing   Comments: In regards to PLOF: questionable historian although per chart pt did live alone and was found in bath tub        OT Problem List: Decreased activity tolerance;Impaired balance (sitting and/or standing);Decreased safety awareness;Decreased knowledge of use of DME or AE;Decreased knowledge of precautions;Cardiopulmonary status limiting activity;Pain;Decreased cognition      OT Treatment/Interventions: Self-care/ADL training;Therapeutic exercise;Energy conservation;DME and/or AE instruction;Therapeutic activities;Cognitive remediation/compensation;Patient/family education;Balance training    OT Goals(Current goals can be found in the care plan section) Acute Rehab OT Goals Patient Stated Goal: feel better OT Goal Formulation: With patient Time For Goal Achievement: 08/07/19 Potential to Achieve Goals: Good ADL Goals Pt Will Perform Grooming: with modified independence;standing Pt Will Perform Lower Body Dressing: with modified independence;with adaptive equipment;sit to/from stand Pt Will Transfer to Toilet: with modified independence;ambulating Additional ADL Goal #1: Pt will demonstrate independence with medication management. Additional ADL Goal #2: Pt will complete multistep cognition task for safe engagement in ADL/IADL and functional mobility.  OT Frequency: Min 2X/week   Barriers to D/C: Decreased caregiver support  pt lives alone       Co-evaluation              AM-PAC OT "6 Clicks" Daily Activity     Outcome Measure Help from another person eating meals?: A Little Help from another person taking care of personal grooming?: A Little Help from another person toileting, which includes using toliet, bedpan, or urinal?: A Little Help from another person bathing (  including washing, rinsing, drying)?: A Little Help from another person to put on  and taking off regular upper body clothing?: A Little Help from another person to put on and taking off regular lower body clothing?: A Lot 6 Click Score: 17   End of Session Equipment Utilized During Treatment: Gait belt Nurse Communication: Mobility status (RN present to take vitals)  Activity Tolerance: Patient tolerated treatment well Patient left: in chair;with chair alarm set;with call bell/phone within reach  OT Visit Diagnosis: Unsteadiness on feet (R26.81);Other abnormalities of gait and mobility (R26.89);Muscle weakness (generalized) (M62.81);Other symptoms and signs involving cognitive function                Time: 1316-1340 OT Time Calculation (min): 24 min Charges:  OT Evaluation $OT Eval Moderate Complexity: 1 Mod OT Treatments $Self Care/Home Management : 8-22 mins  Helene Kelp OTR/L Acute Rehabilitation Services Office: [place] 07/24/2019, 1:51 PM

## 2019-07-24 NOTE — Progress Notes (Addendum)
Inpatient Diabetes Program Recommendations  AACE/ADA: New Consensus Statement on Inpatient Glycemic Control (2015)  Target Ranges:  Prepandial:   less than 140 mg/dL      Peak postprandial:   less than 180 mg/dL (1-2 hours)      Critically ill patients:  140 - 180 mg/dL   Lab Results  Component Value Date   GLUCAP 219 (H) 07/24/2019   HGBA1C 10.4 (H) 07/22/2019    Review of Glycemic Control  Diabetes history: DM2 Outpatient Diabetes medications: Lantus 7 units qhs, Novolog 0-12 units tid  Current orders for Inpatient glycemic control: Lantus 15 units daily + Novolog moderate correction tid + hs  Inpatient Diabetes Program Recommendations:   -Increase Lantus to 20 units qd -Add Novolog 3 units tid meal coverage if eats 50% Secure chat to Dr. Benny Lennert with recommendations.  Thank you, Nani Gasser. Czar Ysaguirre, RN, MSN, CDE  Diabetes Coordinator Inpatient Glycemic Control Team Team Pager [phone] (8am-5pm) 07/24/2019 11:29 AM

## 2019-07-24 NOTE — TOC Progression Note (Signed)
Transition of Care [facility]) - Progression Note    Patient Details  Name: Heather Petty MRN: [id number] Date of Birth: 07/15/1950  Transition of Care [facility]) CM/SW [place],  Phone Number: 07/24/2019, 2:22 PM  Clinical Narrative:     CSW called Traci from [facility]. Informed that they would only be able to take pt if [facility] were to provide LOG for rehab portion of SNF.   CSW spoke with mother and daughter in pt room with daughter on speaker phone. Daughter is agreeable to CSW expanding bed search. Daughter also expresses desire to pursue expedited Aide services through [place] and is interested in [place].   CSW expanded bed search to other [place] SNFs.   Expected Discharge Plan: [place] Barriers to Discharge: Continued Medical Work up, Ship broker  Expected Discharge Plan and Services Expected Discharge Plan: [place] Choice: [place] arrangements for the past 2 months: Single Family Home                                       Social Determinants of Health (SDOH) Interventions    Readmission Risk Interventions No flowsheet data found.

## 2019-07-24 NOTE — Progress Notes (Signed)
Pharmacy Antibiotic Note  Heather Petty is a 69 y.o. female admitted on 07/21/2019 with MRSA / enterococcal bacteremia.  Pharmacy has been consulted for vancomycin dosing. On D3 of vancomycin therapy with repeat BCx from 6/15 growing staph aureus. SCr continues to improve. WBC wnl . TTE with no evidence of vegetation.   Plan: -Increase vancomycin to 750 mg IV Q 12 hours for goal trough of 15-20 mcg/mL  -Monitor CBC, renal fx and   Height: 5' 1"  (154.9 cm) Weight: 110 kg (242 lb 8.1 oz) IBW/kg (Calculated) : 47.8  Temp (24hrs), Avg:99.2 F (37.3 C), Min:97.9 F (36.6 C), Max:101.4 F (38.6 C)  Recent Labs  Lab 07/21/19 2205 07/22/19 0009 07/22/19 0238 07/22/19 0403 07/22/19 0812 07/22/19 1518 07/23/19 0501 07/24/19 0420  WBC 10.5  --   --  10.7*  --   --  8.4 9.8  CREATININE 1.42*  --    < > 0.93 0.94 0.88 0.88 0.73  LATICACIDVEN 3.2* 1.9  --   --   --   --   --   --    < > = values in this interval not displayed.    Estimated Creatinine Clearance: 75.1 mL/min (by C-G formula based on SCr of 0.73 mg/dL).    Allergies  Allergen Reactions  . Lasix [Furosemide] Other (See Comments)    IV-LASIX ==> Reaction: Burning    Vancomycin 6/14>> Cefepime 6/14>>6/15 Flagyl 6/14>>6/15  6/14 BCID: MRSA, Enterococcus 6/14 Ucx: 80,000 GNR - pending 6/14 Bcx: 2/2 MRSA per BCID  6/15 Bcx - 2/2 staph aureus   Thank you for allowing pharmacy to be a part of this patient's care.  Albertina Parr, PharmD., BCPS, BCCCP Clinical Pharmacist Clinical phone for 07/24/19 until 3:30pm: [phone] If after 3:30pm, please refer to [facility] for unit-specific pharmacist

## 2019-07-25 LAB — BASIC METABOLIC PANEL
Anion gap: 11 (ref 5–15)
BUN: 7 mg/dL — ABNORMAL LOW (ref 8–23)
CO2: 21 mmol/L — ABNORMAL LOW (ref 22–32)
Calcium: 8 mg/dL — ABNORMAL LOW (ref 8.9–10.3)
Chloride: 105 mmol/L (ref 98–111)
Creatinine, Ser: 0.68 mg/dL (ref 0.44–1.00)
GFR calc Af Amer: >60 mL/min (ref >60)
GFR calc non Af Amer: >60 mL/min (ref >60)
Glucose, Bld: 247 mg/dL — ABNORMAL HIGH (ref 70–99)
Potassium: 3 mmol/L — ABNORMAL LOW (ref 3.5–5.1)
Sodium: 137 mmol/L (ref 135–145)

## 2019-07-25 LAB — CULTURE, BLOOD (ROUTINE X 2): Special Requests: ADEQUATE

## 2019-07-25 LAB — GLUCOSE, CAPILLARY
Glucose-Capillary: 212 mg/dL — ABNORMAL HIGH (ref 70–99)
Glucose-Capillary: 294 mg/dL — ABNORMAL HIGH (ref 70–99)
Glucose-Capillary: 327 mg/dL — ABNORMAL HIGH (ref 70–99)
Glucose-Capillary: 305 mg/dL — ABNORMAL HIGH (ref 70–99)

## 2019-07-25 MED ORDER — POLYETHYLENE GLYCOL 3350 17 G PO PACK
17.0000 g | PACK | Freq: Every day | ORAL | Status: DC
  Administered 2019-07-25 – 2019-07-31 (×4): 17 g via ORAL
  Filled 2019-07-25 (×5): qty 1

## 2019-07-25 MED ORDER — IPRATROPIUM-ALBUTEROL 0.5-2.5 (3) MG/3ML IN SOLN: 3.0000 mL | Freq: Four times a day (QID) | RESPIRATORY_TRACT | Status: DC | PRN

## 2019-07-25 MED ORDER — LIVING WELL WITH DIABETES BOOK - IN SPANISH
Freq: Once | Status: AC
  Filled 2019-07-25: qty 1

## 2019-07-25 MED ORDER — BISACODYL 5 MG PO TBEC: 5.0000 mg | DELAYED_RELEASE_TABLET | Freq: Every day | ORAL | Status: DC | PRN

## 2019-07-25 MED ORDER — NYSTATIN 100000 UNIT/GM EX POWD
1.0000 g | Freq: Two times a day (BID) | CUTANEOUS | Status: DC | PRN
  Administered 2019-07-31: 1 via TOPICAL
  Filled 2019-07-25: qty 15

## 2019-07-25 MED ORDER — PRAVASTATIN SODIUM 40 MG PO TABS
40.0000 mg | ORAL_TABLET | Freq: Every day | ORAL | Status: DC
  Administered 2019-07-25 – 2019-07-31 (×7): 40 mg via ORAL
  Filled 2019-07-25 (×7): qty 1

## 2019-07-25 MED ORDER — INSULIN GLARGINE 100 UNIT/ML ~~LOC~~ SOLN
25.0000 [IU] | SUBCUTANEOUS | Status: DC
  Administered 2019-07-25 – 2019-07-31 (×7): 25 [IU] via SUBCUTANEOUS
  Filled 2019-07-25 (×8): qty 0.25

## 2019-07-25 MED ORDER — ALBUTEROL SULFATE (2.5 MG/3ML) 0.083% IN NEBU: 2.5000 mg | INHALATION_SOLUTION | Freq: Four times a day (QID) | RESPIRATORY_TRACT | Status: DC | PRN

## 2019-07-25 MED ORDER — TRAMADOL HCL 50 MG PO TABS
50.0000 mg | ORAL_TABLET | Freq: Four times a day (QID) | ORAL | Status: DC | PRN
  Administered 2019-07-25 – 2019-07-30 (×7): 50 mg via ORAL
  Filled 2019-07-25 (×6): qty 1

## 2019-07-25 MED ORDER — COLLAGENASE 250 UNIT/GM EX OINT
TOPICAL_OINTMENT | Freq: Every day | CUTANEOUS | Status: DC
  Filled 2019-07-25: qty 30

## 2019-07-25 MED ORDER — FOLIC ACID 1 MG PO TABS
1.0000 mg | ORAL_TABLET | Freq: Every day | ORAL | Status: DC
  Administered 2019-07-25 – 2019-07-31 (×7): 1 mg via ORAL
  Filled 2019-07-25 (×7): qty 1

## 2019-07-25 MED ORDER — BENAZEPRIL HCL 20 MG PO TABS
20.0000 mg | ORAL_TABLET | Freq: Every day | ORAL | Status: DC
  Administered 2019-07-25 – 2019-07-31 (×7): 20 mg via ORAL
  Filled 2019-07-25 (×8): qty 1

## 2019-07-25 MED ORDER — ZINC SULFATE 220 (50 ZN) MG PO CAPS
220.0000 mg | ORAL_CAPSULE | Freq: Every day | ORAL | Status: DC
  Administered 2019-07-25 – 2019-07-31 (×7): 220 mg via ORAL
  Filled 2019-07-25 (×7): qty 1

## 2019-07-25 MED ORDER — ASCORBIC ACID 500 MG PO TABS
500.0000 mg | ORAL_TABLET | Freq: Two times a day (BID) | ORAL | Status: DC
  Administered 2019-07-25 – 2019-07-31 (×13): 500 mg via ORAL
  Filled 2019-07-25 (×14): qty 1

## 2019-07-25 MED ORDER — ALBUTEROL SULFATE HFA 108 (90 BASE) MCG/ACT IN AERS: 2.0000 | INHALATION_SPRAY | RESPIRATORY_TRACT | Status: DC | PRN

## 2019-07-25 MED ORDER — POTASSIUM CHLORIDE CRYS ER 20 MEQ PO TBCR
40.0000 meq | EXTENDED_RELEASE_TABLET | ORAL | Status: AC
  Administered 2019-07-25 (×2): 40 meq via ORAL
  Filled 2019-07-25 (×2): qty 2

## 2019-07-25 MED ORDER — INSULIN ASPART PROT & ASPART (70-30 MIX) 100 UNIT/ML ~~LOC~~ SUSP
12.0000 [IU] | Freq: Once | SUBCUTANEOUS | Status: AC
  Administered 2019-07-25: 12 [IU] via SUBCUTANEOUS
  Filled 2019-07-25 (×2): qty 10

## 2019-07-25 MED ORDER — CYANOCOBALAMIN 1000 MCG/ML IJ SOLN
1000.0000 ug | INTRAMUSCULAR | Status: DC
  Administered 2019-07-25: 1000 ug via INTRAMUSCULAR
  Filled 2019-07-25: qty 1

## 2019-07-25 MED ORDER — ALLOPURINOL 300 MG PO TABS
300.0000 mg | ORAL_TABLET | Freq: Every day | ORAL | Status: DC
  Administered 2019-07-26 – 2019-07-31 (×6): 300 mg via ORAL
  Filled 2019-07-25 (×7): qty 1

## 2019-07-25 MED ORDER — B COMPLEX-C PO TABS
1.0000 | ORAL_TABLET | Freq: Every day | ORAL | Status: DC
  Administered 2019-07-25 – 2019-07-31 (×7): 1 via ORAL
  Filled 2019-07-25 (×7): qty 1

## 2019-07-25 MED ORDER — B COMPLEX PO TABS: 1.0000 | ORAL_TABLET | Freq: Every day | ORAL | Status: DC

## 2019-07-25 MED ORDER — METOPROLOL TARTRATE 25 MG PO TABS
25.0000 mg | ORAL_TABLET | Freq: Two times a day (BID) | ORAL | Status: DC
  Administered 2019-07-25 – 2019-07-31 (×14): 25 mg via ORAL
  Filled 2019-07-25 (×13): qty 1

## 2019-07-25 MED ORDER — VITAMIN D (ERGOCALCIFEROL) 1.25 MG (50000 UNIT) PO CAPS
50000.0000 [IU] | ORAL_CAPSULE | ORAL | Status: DC
  Administered 2019-07-28: 50000 [IU] via ORAL
  Filled 2019-07-25: qty 1

## 2019-07-25 NOTE — TOC Progression Note (Signed)
Transition of Care [facility]) - Progression Note    Patient Details  Name: Heather Petty MRN: [id number] Date of Birth: 01-May-1950  Transition of Care [facility]) CM/SW [place], [place] Phone Number: 07/25/2019, 3:33 PM  Clinical Narrative:     CSW provided bed offers to pt daughter. CSW also left resources in pt room for [place] [facility] and Me[employer] Wheels. CSW informed daughter that CSW will fax Nurse Aide/PCS application to [facility] on day of discharge. Daughter gives permission for liason to [place] to contact daughter.   CSW notifies [place] from [place] that she can contact daughter.   CSW followed up with Traci from [place] to see if medicare part B may assist pt in going to [place]. Traci stated that part B may help and requested that daughter call Traci.   CSW called daughter and left message to call Traci and traci's contact info.  Expected Discharge Plan: [place] Barriers to Discharge: Continued Medical Work up, Ship broker  Expected Discharge Plan and Services Expected Discharge Plan: [place] Choice: [place] arrangements for the past 2 months: Single Family Home                                       Social Determinants of Health (SDOH) Interventions    Readmission Risk Interventions No flowsheet data found.

## 2019-07-25 NOTE — Progress Notes (Signed)
PROGRESS NOTE  Heather Petty DGL:[id number] DOB: Jul 02, 1950 DOA: 07/21/2019 PCP: [name], MD  Brief History   Heather Petty is a 69 y.o. F with hx HTN, IDDM, obesity, hx CVA, schizophrenia/depression and asthma who presented with acute AMS.  Rreportedly found by her daughter slumped over in her bathtub, family had last spoken to her 2 days ago.  EMS activated, found CBG 550, tachycardic.  In the ER, temp 103F, tachypneic and tachycardic.  Lactate 3.2, Cr 1.4 from baseline 0.9, CK 695.  CT head, chest abdomen and pelvis notable only for ulceration and fat stranding over left iliac.  Given fluids, IV insulin and empiric antibiotics and admitted to hospitalist service. DKA resolved, and CK has diminished.  The patient was transferred [facility] on 07/22/2019 for further evaluation.   Blood cultures x 2 have grown out MRSA. One of them has grown out enterococcus and MRSA. Urine culture has grown out Klebsiella. Infectious disease has been consulted. She is receiving IV Vancomycin. Echocardiogram has demonstrated an EF of 65-70%. LV normal function with mild concentric left ventricular hypertrophy. There is evidence of Grade I diastolic dysfunction. RV systolic function and size are normal. Indeterminate number of Aortic valve cusps are present. No aortic stenosis. There is no evidence of valvular vegetations. TEE is recommended. I have ordered this.  Surveillance cultures drawn on 07/22/2019, 07/23/2019 have again grown out GPC. They have been repeated again today. PICC cannot be placed until surveillance cultures have had no growth for 48-72 hours.  Consultants  . Infectious disease . Wound care  Procedures  . None  Antibiotics   Anti-infectives (From admission, onward)   Start     Dose/Rate Route Frequency Ordered Stop   07/24/19 1200  vancomycin (VANCOREADY) IVPB 750 mg/150 mL     Discontinue     750 mg 150 mL/hr over 60 Minutes Intravenous Every 12 hours 07/24/19  0925     07/23/19 0000  vancomycin (VANCOREADY) IVPB 1250 mg/250 mL  Status:  Discontinued        1,250 mg 166.7 mL/hr over 90 Minutes Intravenous Every 24 hours 07/22/19 0252 07/24/19 0925   07/22/19 1000  ceFEPIme (MAXIPIME) 2 g in sodium chloride 0.9 % 100 mL IVPB  Status:  Discontinued        2 g 200 mL/hr over 30 Minutes Intravenous Every 12 hours 07/22/19 0252 07/22/19 1636   07/22/19 0600  metroNIDAZOLE (FLAGYL) IVPB 500 mg  Status:  Discontinued        500 mg 100 mL/hr over 60 Minutes Intravenous Every 8 hours 07/22/19 0240 07/22/19 1636   07/21/19 2200  ceFEPIme (MAXIPIME) 2 g in sodium chloride 0.9 % 100 mL IVPB        2 g 200 mL/hr over 30 Minutes Intravenous  Once 07/21/19 2148 07/21/19 2254   07/21/19 2200  metroNIDAZOLE (FLAGYL) IVPB 500 mg        500 mg 100 mL/hr over 60 Minutes Intravenous  Once 07/21/19 2148 07/21/19 2302   07/21/19 2200  vancomycin (VANCOCIN) IVPB 1000 mg/200 mL premix  Status:  Discontinued        1,000 mg 200 mL/hr over 60 Minutes Intravenous  Once 07/21/19 2148 07/21/19 2153   07/21/19 2200  vancomycin (VANCOCIN) 2,250 mg in sodium chloride 0.9 % 500 mL IVPB        2,250 mg 250 mL/hr over 120 Minutes Intravenous  Once 07/21/19 2153 07/22/19 0058     Subjective  The patient is  resting comfortably. She is complaining of pain in her right shoulder.  Objective   Vitals:  Vitals:   07/25/19 1228 07/25/19 1529  BP: (!) 145/46 (!) 160/60  Pulse: 95 93  Resp: 19 (!) 27  Temp: 98.7 F (37.1 C) 98.7 F (37.1 C)  SpO2: 93% 95%   Exam:  Constitutional:  . The patient is awake, alert, and oriented x 3. No acute distress. Respiratory:  . No increased work of breathing. . No wheezes, rales, or rhonchi . No tactile fremitus Cardiovascular:  . Regular rate and rhythm . No murmurs, ectopy, or gallups. . No lateral PMI. No thrills. Abdomen:  . Abdomen is soft, non-tender, non-distended . No hernias, masses, or organomegaly . Normoactive bowel  sounds.  Musculoskeletal:  . No cyanosis, clubbing, or edema . Pain in right upper extremity and shoulder. Skin:  . No rashes, lesions, ulcers . palpation of skin: no induration or nodules Neurologic:  . CN 2-12 intact . Sensation all 4 extremities intact Psychiatric:  . Mental status o Mood, affect appropriate o Orientation to person, place, time  . judgment and insight appear intact I have personally reviewed the following:   Today's Data  . Vitals, BMP, CBC, CK, Glucoses,  . Echocardiogram  Micro Data  . Blood Cultures x 2 (07/21/2019) . Urine culture (07/21/2019) . Blood Cultures x 2 (07/22/2019)  Scheduled Meds: . enoxaparin (LOVENOX) injection  40 mg Subcutaneous Q24H  . insulin aspart  0-15 Units Subcutaneous TID WC  . insulin aspart  0-5 Units Subcutaneous QHS  . insulin aspart  3 Units Subcutaneous TID WC  . insulin glargine  25 Units Subcutaneous Q24H  . metoprolol tartrate  25 mg Oral BID  . nystatin   Topical BID   Continuous Infusions: . lactated ringers 100 mL/hr at 07/24/19 0920  . vancomycin 750 mg (07/25/19 1430)    Principal Problem:   MRSA bacteremia Active Problems:   AKI (acute kidney injury) ([place])   Type II diabetes mellitus with renal manifestations ([place])   HTN (hypertension)   Paranoid schizophrenia ([place])   Hyperlipidemia associated with type 2 diabetes mellitus (HCC)   Pressure ulcer   Sepsis ([place])   Long Q-T syndrome   DKA (diabetic ketoacidoses) (HCC)   Rhabdomyolysis   Acute metabolic encephalopathy   Enterococcal bacteremia   Vitiligo   LOS: 3 days   A & P  Sepsis: Resolved. Due to MRSA and enterococcal bacteremia. The patient presented with fever, tachycardia, lactic acidemia and encephalopathy/AKI in setting of probably infected sacral ulcer, otherwise source unclear. Encephalopathy appears to have cleared.  MRSA and Enterococcal Bacteremia: Likely as a result of UTI and sacral decubitus ulcer. The patient is currently on  vancomycin. I appreciate infectious disease's assistance. Echocardiogram has demonstrated an EF of 65-70%. LV normal function with mild concentric left ventricular hypertrophy. There is evidence of Grade I diastolic dysfunction. RV systolic function and size are normal. Indeterminate number of Aortic valve cusps are present. No aortic stenosis. There is no evidence of valvular vegetations. TEE is recommended. I have ordered this. Surveillance cultures drawn on 07/22/2019 and 07/23/2019 have again grown out GPC. They have been repeated. She cannot have PICC placed, and therefore cannot discharge until they have had no growth for 48-72 hours. The patient is receiving Vancomycin and Zyvox.  DKA: Resolved. Multifactorial. Acute infection/bacteremia in setting of dehydration and lack of access to insulin for 1-2 days while she was down. The patient is currently receiving Lantus 15 units daily  with FSBS and SSI.   Rhabdomyolysis: Resolving. Mild, likely too low to be cause of AKI, which is more likely from prerenal injury in sepsis. Monitor and continue IV fluids until CK is normalized.  Hypokalemia/Hypophosphatemia: Supplement and monitor.  AKI: Resolved. Creatinine was 1.42 on admission. Creatinine 0.68 this morning after IV fluids. Continue to monitor creatinine, electrolytes, and volume status.   Acute metabolic encephalopathy: Resolved. Continue to hold Risperdal, gabapentin for now.  Stage II sacral pressure ulcer (POA): Wound care consulted. Likely source of MRSA bacteremia.   Hypertension: The patient's blood pressures are increasing. Will restart benazapril. Will restart other home antihypertensives as necessary to control blood pressures. normotensive despite lasix, nadolol, amlodipine, and benazapril being held. Hold Lasix, nadolol, amlodippine, and benazepril being held.  Asthma: Noted. Stable and quiescent.  Schizoaffective disorder: Hold Risperdal for now  I have seen and examined  this patient myself. I have spent 32 minutes in her evaluation and care.  DVT prophylaxis: Lovenox CODE STATUS: Full Code FAMILY Communication: I have discussed the patient in detail with the patient's daughter Margreta Journey. All questions answered to the best of my ability. Disposition: TBD. Barriers to discharge includes continued growth of GPC from surveillance cultures and need for TEE. She will need PICC after than and to be set up for outpatient antibiotics. Status is: Inpatient  Remains inpatient appropriate because: Inpatient level of care appropriate due to severity of illness.   Dispo: The patient is from: Home  Anticipated d/c is to: TBD  Anticipated d/c date is: 3 days  Patient currently is not medically stable to d/c. Barriers to discharge includes continued growth of GPC from surveillance cultures and need for TEE.  Corian Handley, DO [facility] Direct contact: see [email or web address]  7PM-7AM contact night coverage as above 07/25/2019, 3:55 PM  LOS: 1 day

## 2019-07-25 NOTE — Progress Notes (Addendum)
Patient ID: Heather Petty, female   DOB: May 25, 1950, 69 y.o.   MRN: [id number]         [facility]  Date of Admission:  07/21/2019           Day 5 vancomycin ASSESSMENT: She is improving slowly on therapy for MRSA and enterococcal bacteremia.  I do not find any evidence of septic arthritis on exam. Repeat blood cultures are positive again.  I will repeat blood cultures today I would hold off on PICC placement until they are negative at 72 hours.  PLAN: 1. Continue vancomycin 2. Repeat blood cultures 3. TEE 4. Please call Dr. Lita Mains [phone]) for any infectious disease questions this weekend  Principal Problem:   MRSA bacteremia Active Problems:   Sepsis ([place])   Enterococcal bacteremia   AKI (acute kidney injury) ([place])   Type II diabetes mellitus with renal manifestations ([place])   HTN (hypertension)   Paranoid schizophrenia ([place])   Hyperlipidemia associated with type 2 diabetes mellitus ([place])   Pressure ulcer   Long Q-T syndrome   DKA (diabetic ketoacidoses) ([facility])   Rhabdomyolysis   Acute metabolic encephalopathy   Vitiligo   Scheduled Meds: . enoxaparin (LOVENOX) injection  40 mg Subcutaneous Q24H  . insulin aspart  0-15 Units Subcutaneous TID WC  . insulin aspart  0-5 Units Subcutaneous QHS  . insulin aspart  3 Units Subcutaneous TID WC  . insulin glargine  25 Units Subcutaneous Q24H  . metoprolol tartrate  25 mg Oral BID  . nystatin   Topical BID  . potassium chloride  40 mEq Oral Q4H   Continuous Infusions: . lactated ringers 100 mL/hr at 07/24/19 0920  . vancomycin 750 mg (07/25/19 0017)   PRN Meds:.acetaminophen **OR** acetaminophen, ALPRAZolam, ibuprofen, labetalol   SUBJECTIVE: She is still having pain in her right shoulder.  Review of Systems: Review of Systems  Constitutional: Negative for chills, diaphoresis and fever.  Respiratory: Negative for cough and shortness of breath.   Cardiovascular: Negative for  chest pain.  Gastrointestinal: Negative for abdominal pain, diarrhea, nausea and vomiting.  Genitourinary: Negative for dysuria.  Musculoskeletal: Positive for joint pain.    Allergies  Allergen Reactions  . Lasix [Furosemide] Other (See Comments)    IV-LASIX ==> Reaction: Burning    OBJECTIVE: Vitals:   07/25/19 0258 07/25/19 0630 07/25/19 0841 07/25/19 1228  BP: 133/75 (!) 152/56 (!) 173/70 (!) 145/46  Pulse: 86 83 93 95  Resp: (!) 26 18 19 19   Temp: 99 F (37.2 C) 98.4 F (36.9 C) 98.7 F (37.1 C) 98.7 F (37.1 C)  TempSrc: Oral Oral Oral Oral  SpO2: 95% 98% 99% 93%  Weight:      Height:       Body mass index is 45.82 kg/m.  Physical Exam Constitutional:      Comments: She is uncomfortable due to her right shoulder pain.  Cardiovascular:     Rate and Rhythm: Normal rate and regular rhythm.     Heart sounds: No murmur heard.   Pulmonary:     Effort: Pulmonary effort is normal.     Breath sounds: Normal breath sounds.  Abdominal:     Palpations: Abdomen is soft.     Tenderness: There is no abdominal tenderness.  Musculoskeletal:     Cervical back: Neck supple.     Comments: She has some discomfort when raising her right arm but no unusual warmth, redness or swelling of her shoulder.  Skin:  Findings: No rash.     Lab Results Lab Results  Component Value Date   WBC 9.8 07/24/2019   HGB 10.1 (L) 07/24/2019   HCT 30.8 (L) 07/24/2019   MCV 91.9 07/24/2019   PLT 119 (L) 07/24/2019    Lab Results  Component Value Date   CREATININE 0.68 07/25/2019   BUN 7 (L) 07/25/2019   NA 137 07/25/2019   K 3.0 (L) 07/25/2019   CL 105 07/25/2019   CO2 21 (L) 07/25/2019    Lab Results  Component Value Date   ALT 34 07/23/2019   AST 53 (H) 07/23/2019   ALKPHOS 125 07/23/2019   BILITOT 1.6 (H) 07/23/2019     Microbiology: Recent Results (from the past 240 hour(s))  Culture, blood (Routine x 2)     Status: Abnormal   Collection Time: 07/21/19 10:01 PM    Specimen: BLOOD RIGHT HAND  Result Value Ref Range Status   Specimen Description BLOOD RIGHT HAND  Final   Special Requests   Final    BOTTLES DRAWN AEROBIC ONLY Blood Culture adequate volume   Culture  Setup Time   Final    GRAM POSITIVE COCCI IN CHAINS IN CLUSTERS AEROBIC BOTTLE ONLY CRITICAL RESULT CALLED TO, READ BACK BY AND VERIFIED WITH: Jene Every PHARMD 1505 07/22/19 A BROWNING Performed at [facility], [place] [address] [place], [place] [address]    Culture (A)  Final    STAPHYLOCOCCUS AUREUS SUSCEPTIBILITIES PERFORMED ON PREVIOUS CULTURE WITHIN THE LAST 5 DAYS. ENTEROCOCCUS FAECALIS STAPHYLOCOCCUS HOMINIS    Report Status 07/25/2019 FINAL  Final   Organism ID, Bacteria ENTEROCOCCUS FAECALIS  Final   Organism ID, Bacteria STAPHYLOCOCCUS HOMINIS  Final      Susceptibility   Enterococcus faecalis - MIC*    AMPICILLIN <=2 SENSITIVE Sensitive     VANCOMYCIN 1 SENSITIVE Sensitive     GENTAMICIN SYNERGY SENSITIVE Sensitive     * ENTEROCOCCUS FAECALIS   Staphylococcus hominis - MIC*    CIPROFLOXACIN <=0.5 SENSITIVE Sensitive     ERYTHROMYCIN >=8 RESISTANT Resistant     GENTAMICIN <=0.5 SENSITIVE Sensitive     OXACILLIN <=0.25 SENSITIVE Sensitive     TETRACYCLINE >=16 RESISTANT Resistant     VANCOMYCIN 1 SENSITIVE Sensitive     TRIMETH/SULFA <=10 SENSITIVE Sensitive     CLINDAMYCIN <=0.25 SENSITIVE Sensitive     RIFAMPIN <=0.5 SENSITIVE Sensitive     Inducible Clindamycin NEGATIVE Sensitive     * STAPHYLOCOCCUS HOMINIS  Blood Culture ID Panel (Reflexed)     Status: Abnormal   Collection Time: 07/21/19 10:01 PM  Result Value Ref Range Status   Enterococcus species DETECTED (A) NOT DETECTED Final    Comment: CRITICAL RESULT CALLED TO, READ BACK BY AND VERIFIED WITH: Jene Every PHARMD 1505 07/22/19 A BROWNING    Vancomycin resistance NOT DETECTED NOT DETECTED Final   Listeria monocytogenes NOT DETECTED NOT DETECTED Final   Staphylococcus species DETECTED (A) NOT DETECTED Final     Comment: CRITICAL RESULT CALLED TO, READ BACK BY AND VERIFIED WITH: Jene Every PHARMD 1505 07/22/19 A BROWNING    Staphylococcus aureus (BCID) DETECTED (A) NOT DETECTED Final    Comment: Methicillin (oxacillin)-resistant Staphylococcus aureus (MRSA). MRSA is predictably resistant to beta-lactam antibiotics (except ceftaroline). Preferred therapy is vancomycin unless clinically contraindicated. Patient requires contact precautions if  hospitalized. CRITICAL RESULT CALLED TO, READ BACK BY AND VERIFIED WITH: Jene Every PHARMD 1505 07/22/19 A BROWNING    Methicillin resistance DETECTED (A) NOT DETECTED  Final    Comment: CRITICAL RESULT CALLED TO, READ BACK BY AND VERIFIED WITH: Jene Every PHARMD 1505 07/22/19 A BROWNING    Streptococcus species NOT DETECTED NOT DETECTED Final   Streptococcus agalactiae NOT DETECTED NOT DETECTED Final   Streptococcus pneumoniae NOT DETECTED NOT DETECTED Final   Streptococcus pyogenes NOT DETECTED NOT DETECTED Final   Acinetobacter baumannii NOT DETECTED NOT DETECTED Final   Enterobacteriaceae species NOT DETECTED NOT DETECTED Final   Enterobacter cloacae complex NOT DETECTED NOT DETECTED Final   Escherichia coli NOT DETECTED NOT DETECTED Final   Klebsiella oxytoca NOT DETECTED NOT DETECTED Final   Klebsiella pneumoniae NOT DETECTED NOT DETECTED Final   Proteus species NOT DETECTED NOT DETECTED Final   Serratia marcescens NOT DETECTED NOT DETECTED Final   Haemophilus influenzae NOT DETECTED NOT DETECTED Final   Neisseria meningitidis NOT DETECTED NOT DETECTED Final   Pseudomonas aeruginosa NOT DETECTED NOT DETECTED Final   Candida albicans NOT DETECTED NOT DETECTED Final   Candida glabrata NOT DETECTED NOT DETECTED Final   Candida krusei NOT DETECTED NOT DETECTED Final   Candida parapsilosis NOT DETECTED NOT DETECTED Final   Candida tropicalis NOT DETECTED NOT DETECTED Final    Comment: Performed at [facility], [place]. [address] [place], [place] [address]    Culture, blood (Routine x 2)     Status: Abnormal   Collection Time: 07/21/19 10:05 PM   Specimen: BLOOD RIGHT FOREARM  Result Value Ref Range Status   Specimen Description BLOOD RIGHT FOREARM  Final   Special Requests   Final    BOTTLES DRAWN AEROBIC ONLY Blood Culture adequate volume   Culture  Setup Time   Final    GRAM POSITIVE COCCI IN CLUSTERS AEROBIC BOTTLE ONLY CRITICAL VALUE NOTED.  VALUE IS CONSISTENT WITH PREVIOUSLY REPORTED AND CALLED VALUE. Performed at [facility], [place] [address], [place], [place] [address]    Culture METHICILLIN RESISTANT STAPHYLOCOCCUS AUREUS (A)  Final   Report Status 07/24/2019 FINAL  Final   Organism ID, Bacteria METHICILLIN RESISTANT STAPHYLOCOCCUS AUREUS  Final      Susceptibility   Methicillin resistant staphylococcus aureus - MIC*    CIPROFLOXACIN >=8 RESISTANT Resistant     ERYTHROMYCIN >=8 RESISTANT Resistant     GENTAMICIN <=0.5 SENSITIVE Sensitive     OXACILLIN >=4 RESISTANT Resistant     TETRACYCLINE <=1 SENSITIVE Sensitive     VANCOMYCIN <=0.5 SENSITIVE Sensitive     TRIMETH/SULFA <=10 SENSITIVE Sensitive     CLINDAMYCIN <=0.25 SENSITIVE Sensitive     RIFAMPIN <=0.5 SENSITIVE Sensitive     Inducible Clindamycin NEGATIVE Sensitive     * METHICILLIN RESISTANT STAPHYLOCOCCUS AUREUS  Urine culture     Status: Abnormal   Collection Time: 07/21/19 10:07 PM   Specimen: Urine, Catheterized  Result Value Ref Range Status   Specimen Description URINE, CATHETERIZED  Final   Special Requests   Final    NONE Performed at [facility], [place] [address], [place], [place] [address]    Culture 80,000 COLONIES/mL KLEBSIELLA PNEUMONIAE (A)  Final   Report Status 07/23/2019 FINAL  Final   Organism ID, Bacteria KLEBSIELLA PNEUMONIAE (A)  Final      Susceptibility   Klebsiella pneumoniae - MIC*    AMPICILLIN >=32 RESISTANT Resistant     CEFAZOLIN <=4 SENSITIVE Sensitive     CEFTRIAXONE <=0.25 SENSITIVE Sensitive     CIPROFLOXACIN  <=0.25 SENSITIVE Sensitive     GENTAMICIN <=1 SENSITIVE Sensitive  IMIPENEM <=0.25 SENSITIVE Sensitive     NITROFURANTOIN 32 SENSITIVE Sensitive     TRIMETH/SULFA <=20 SENSITIVE Sensitive     AMPICILLIN/SULBACTAM 4 SENSITIVE Sensitive     PIP/TAZO <=4 SENSITIVE Sensitive     * 80,000 COLONIES/mL KLEBSIELLA PNEUMONIAE  SARS Coronavirus 2 by RT PCR (hospital order, performed in [facility] hospital lab) Nasopharyngeal Nasopharyngeal Swab     Status: None   Collection Time: 07/21/19 10:17 PM   Specimen: Nasopharyngeal Swab  Result Value Ref Range Status   SARS Coronavirus 2 NEGATIVE NEGATIVE Final    Comment: (NOTE) SARS-CoV-2 target nucleic acids are NOT DETECTED.  The SARS-CoV-2 RNA is generally detectable in upper and lower respiratory specimens during the acute phase of infection. The lowest concentration of SARS-CoV-2 viral copies this assay can detect is 250 copies / mL. A negative result does not preclude SARS-CoV-2 infection and should not be used as the sole basis for treatment or other patient management decisions.  A negative result may occur with improper specimen collection / handling, submission of specimen other than nasopharyngeal swab, presence of viral mutation(s) within the areas targeted by this assay, and inadequate number of viral copies (<250 copies / mL). A negative result must be combined with clinical observations, patient history, and epidemiological information.  Fact Sheet for Patients:   StrictlyIdeas.no  Fact Sheet for Healthcare Providers: BankingDealers.co.za  This test is not yet approved or  cleared by the [place] FDA and has been authorized for detection and/or diagnosis of SARS-CoV-2 by FDA under an Emergency Use Authorization (EUA).  This EUA will remain in effect (meaning this test can be used) for the duration of the COVID-19 declaration under Section 564(b)(1) of the Act, 21  U.S.C. section 360bbb-3(b)(1), unless the authorization is terminated or revoked sooner.  Performed at [facility], [place] [address] [place], [place] [address]   Respiratory Panel by PCR     Status: None   Collection Time: 07/22/19 12:58 AM   Specimen: Nasopharyngeal Swab; Respiratory  Result Value Ref Range Status   Adenovirus NOT DETECTED NOT DETECTED Final   Coronavirus 229E NOT DETECTED NOT DETECTED Final    Comment: (NOTE) The Coronavirus on the Respiratory Panel, DOES NOT test for the novel  Coronavirus (2019 nCoV)    Coronavirus HKU1 NOT DETECTED NOT DETECTED Final   Coronavirus NL63 NOT DETECTED NOT DETECTED Final   Coronavirus OC43 NOT DETECTED NOT DETECTED Final   Metapneumovirus NOT DETECTED NOT DETECTED Final   Rhinovirus / Enterovirus NOT DETECTED NOT DETECTED Final   Influenza A NOT DETECTED NOT DETECTED Final   Influenza B NOT DETECTED NOT DETECTED Final   Parainfluenza Virus 1 NOT DETECTED NOT DETECTED Final   Parainfluenza Virus 2 NOT DETECTED NOT DETECTED Final   Parainfluenza Virus 3 NOT DETECTED NOT DETECTED Final   Parainfluenza Virus 4 NOT DETECTED NOT DETECTED Final   Respiratory Syncytial Virus NOT DETECTED NOT DETECTED Final   Bordetella pertussis NOT DETECTED NOT DETECTED Final   Chlamydophila pneumoniae NOT DETECTED NOT DETECTED Final   Mycoplasma pneumoniae NOT DETECTED NOT DETECTED Final    Comment: Performed at [facility], [place]. [address] [place], [place] [address]  Culture, blood (routine x 2)     Status: Abnormal   Collection Time: 07/22/19  8:11 PM   Specimen: BLOOD  Result Value Ref Range Status   Specimen Description BLOOD RIGHT ANTECUBITAL  Final   Special Requests   Final    BOTTLES DRAWN AEROBIC  ONLY Blood Culture results may not be optimal due to an inadequate volume of blood received in culture bottles   Culture  Setup Time   Final    GRAM POSITIVE COCCI IN CLUSTERS AEROBIC BOTTLE ONLY CRITICAL RESULT CALLED TO, READ  BACK BY AND VERIFIED WITH: J. FRENS PHrmd, at 1353 07/23/19 by d. vanhook    Culture (A)  Final    STAPHYLOCOCCUS AUREUS SUSCEPTIBILITIES PERFORMED ON PREVIOUS CULTURE WITHIN THE LAST 5 DAYS. Performed at [facility], [place] [address] [place], [place] [address]    Report Status 07/25/2019 FINAL  Final  Culture, blood (routine x 2)     Status: Abnormal   Collection Time: 07/22/19  8:26 PM   Specimen: BLOOD LEFT HAND  Result Value Ref Range Status   Specimen Description BLOOD LEFT HAND  Final   Special Requests   Final    BOTTLES DRAWN AEROBIC ONLY Blood Culture results may not be optimal due to an inadequate volume of blood received in culture bottles   Culture  Setup Time   Final    GRAM POSITIVE COCCI IN CLUSTERS AEROBIC BOTTLE ONLY CRITICAL VALUE NOTED.  VALUE IS CONSISTENT WITH PREVIOUSLY REPORTED AND CALLED VALUE.    Culture (A)  Final    STAPHYLOCOCCUS AUREUS SUSCEPTIBILITIES PERFORMED ON PREVIOUS CULTURE WITHIN THE LAST 5 DAYS. Performed at [facility], [place] [address], [place], [place] [address]    Report Status 07/25/2019 FINAL  Final  Culture, blood (routine x 2)     Status: None (Preliminary result)   Collection Time: 07/24/19 10:33 AM   Specimen: BLOOD LEFT ARM  Result Value Ref Range Status   Specimen Description BLOOD LEFT ARM  Final   Special Requests   Final    BOTTLES DRAWN AEROBIC ONLY Blood Culture results may not be optimal due to an inadequate volume of blood received in culture bottles   Culture  Setup Time   Final    AEROBIC BOTTLE ONLY GRAM POSITIVE COCCI IN CLUSTERS CRITICAL VALUE NOTED.  VALUE IS CONSISTENT WITH PREVIOUSLY REPORTED AND CALLED VALUE. Performed at [facility], [place] [address] [place], [place] [address]    Culture PENDING  Incomplete   Report Status PENDING  Incomplete  Culture, blood (routine x 2)     Status: None (Preliminary result)   Collection Time: 07/24/19 10:33 AM   Specimen: BLOOD LEFT HAND  Result Value Ref  Range Status   Specimen Description BLOOD LEFT HAND  Final   Special Requests   Final    BOTTLES DRAWN AEROBIC ONLY Blood Culture results may not be optimal due to an inadequate volume of blood received in culture bottles   Culture  Setup Time   Final    AEROBIC BOTTLE ONLY GRAM POSITIVE COCCI IN CLUSTERS CRITICAL VALUE NOTED.  VALUE IS CONSISTENT WITH PREVIOUSLY REPORTED AND CALLED VALUE. Performed at [facility], [place] [address] [place], [place] [address]    Culture PENDING  Incomplete   Report Status PENDING  Incomplete    Michel Bickers, MD [facility] [place] [phone] pager   [phone] cell 07/25/2019, 12:42 PM

## 2019-07-25 NOTE — Progress Notes (Signed)
Inpatient Diabetes Program Recommendations  AACE/ADA: New Consensus Statement on Inpatient Glycemic Control (2015)  Target Ranges:  Prepandial:   less than 140 mg/dL      Peak postprandial:   less than 180 mg/dL (1-2 hours)      Critically ill patients:  140 - 180 mg/dL   Lab Results  Component Value Date   GLUCAP 294 (H) 07/25/2019   HGBA1C 10.4 (H) 07/22/2019    Review of Glycemic Control  Inpatient Diabetes Program Recommendations:   Spoke with patient via bedside and reviewed briefly (patient restless and called for assistance getting pulled up in the bed to eat) A1C 10.4 (252 over the past 2-3 months), basic pathophysiology of DM Type 2, basic home care, basic diabetes diet nutrition principles, importance of checking CBGs and maintaining good CBG control to prevent long-term and short-term complications. Reviewed signs and symptoms of hyperglycemia and hypoglycemia and how to treat hypoglycemia at home. Also reviewed blood sugar goals at home.  RNs to provide ongoing basic DM education at bedside with this patient.Ordered Living Well With Diabetes in Spanish. Patient states she is able to read in [place] or [place].  Thank you, Nani Gasser. Deontra Pereyra, RN, MSN, CDE  Diabetes Coordinator Inpatient Glycemic Control Team Team Pager [phone] (8am-5pm) 07/25/2019 1:59 PM

## 2019-07-26 LAB — CULTURE, BLOOD (ROUTINE X 2)

## 2019-07-26 LAB — BASIC METABOLIC PANEL
Anion gap: 9 (ref 5–15)
BUN: 6 mg/dL — ABNORMAL LOW (ref 8–23)
CO2: 25 mmol/L (ref 22–32)
Calcium: 7.9 mg/dL — ABNORMAL LOW (ref 8.9–10.3)
Chloride: 102 mmol/L (ref 98–111)
Creatinine, Ser: 0.57 mg/dL (ref 0.44–1.00)
GFR calc Af Amer: >60 mL/min (ref >60)
GFR calc non Af Amer: >60 mL/min (ref >60)
Glucose, Bld: 174 mg/dL — ABNORMAL HIGH (ref 70–99)
Potassium: 3.3 mmol/L — ABNORMAL LOW (ref 3.5–5.1)
Sodium: 136 mmol/L (ref 135–145)

## 2019-07-26 LAB — GLUCOSE, CAPILLARY
Glucose-Capillary: 136 mg/dL — ABNORMAL HIGH (ref 70–99)
Glucose-Capillary: 168 mg/dL — ABNORMAL HIGH (ref 70–99)
Glucose-Capillary: 132 mg/dL — ABNORMAL HIGH (ref 70–99)
Glucose-Capillary: 124 mg/dL — ABNORMAL HIGH (ref 70–99)

## 2019-07-26 MED ORDER — POTASSIUM CHLORIDE CRYS ER 20 MEQ PO TBCR
40.0000 meq | EXTENDED_RELEASE_TABLET | Freq: Once | ORAL | Status: AC
  Administered 2019-07-26: 40 meq via ORAL
  Filled 2019-07-26: qty 2

## 2019-07-26 NOTE — Progress Notes (Signed)
PROGRESS NOTE  Heather Petty EXN:[id number] DOB: 01/17/1951 DOA: 07/21/2019 PCP: [name], MD  Brief History   Heather Petty is a 69 y.o. F with hx HTN, IDDM, obesity, hx CVA, schizophrenia/depression and asthma who presented with acute AMS.  Rreportedly found by her daughter slumped over in her bathtub, family had last spoken to her 2 days ago.  EMS activated, found CBG 550, tachycardic.  In the ER, temp 103F, tachypneic and tachycardic.  Lactate 3.2, Cr 1.4 from baseline 0.9, CK 695.  CT head, chest abdomen and pelvis notable only for ulceration and fat stranding over left iliac.  Given fluids, IV insulin and empiric antibiotics and admitted to hospitalist service. DKA resolved, and CK has diminished.  The patient was transferred [facility] to [facility] on 07/22/2019 for further evaluation.   Blood cultures x 2 have grown out MRSA. One of them has grown out enterococcus and MRSA. Urine culture has grown out Klebsiella. Infectious disease has been consulted. She is receiving IV Vancomycin. Echocardiogram has demonstrated an EF of 65-70%. LV normal function with mild concentric left ventricular hypertrophy. There is evidence of Grade I diastolic dysfunction. RV systolic function and size are normal. Indeterminate number of Aortic valve cusps are present. No aortic stenosis. There is no evidence of valvular vegetations. TEE is recommended. I have ordered this.  Surveillance cultures drawn on 07/22/2019, 07/23/2019 have again grown out GPC. They have been repeated again today. PICC cannot be placed until surveillance cultures have had no growth for 48-72 hours.  Consultants   Infectious disease  Wound care  Procedures   None  Antibiotics   Anti-infectives (From admission, onward)   Start     Dose/Rate Route Frequency Ordered Stop   07/24/19 1200  vancomycin (VANCOREADY) IVPB 750 mg/150 mL     Discontinue     750 mg 150 mL/hr over 60 Minutes Intravenous Every 12 hours 07/24/19  0925     07/23/19 0000  vancomycin (VANCOREADY) IVPB 1250 mg/250 mL  Status:  Discontinued        1,250 mg 166.7 mL/hr over 90 Minutes Intravenous Every 24 hours 07/22/19 0252 07/24/19 0925   07/22/19 1000  ceFEPIme (MAXIPIME) 2 g in sodium chloride 0.9 % 100 mL IVPB  Status:  Discontinued        2 g 200 mL/hr over 30 Minutes Intravenous Every 12 hours 07/22/19 0252 07/22/19 1636   07/22/19 0600  metroNIDAZOLE (FLAGYL) IVPB 500 mg  Status:  Discontinued        500 mg 100 mL/hr over 60 Minutes Intravenous Every 8 hours 07/22/19 0240 07/22/19 1636   07/21/19 2200  ceFEPIme (MAXIPIME) 2 g in sodium chloride 0.9 % 100 mL IVPB        2 g 200 mL/hr over 30 Minutes Intravenous  Once 07/21/19 2148 07/21/19 2254   07/21/19 2200  metroNIDAZOLE (FLAGYL) IVPB 500 mg        500 mg 100 mL/hr over 60 Minutes Intravenous  Once 07/21/19 2148 07/21/19 2302   07/21/19 2200  vancomycin (VANCOCIN) IVPB 1000 mg/200 mL premix  Status:  Discontinued        1,000 mg 200 mL/hr over 60 Minutes Intravenous  Once 07/21/19 2148 07/21/19 2153   07/21/19 2200  vancomycin (VANCOCIN) 2,250 mg in sodium chloride 0.9 % 500 mL IVPB        2,250 mg 250 mL/hr over 120 Minutes Intravenous  Once 07/21/19 2153 07/22/19 0058     Subjective  The patient is  resting comfortably. No new complaints.  Objective   Vitals:  Vitals:   07/26/19 0724 07/26/19 1145  BP: (!) 169/60 (!) 175/72  Pulse: 78 74  Resp: (!) 29 (!) 26  Temp: 98.8 F (37.1 C) 98.7 F (37.1 C)  SpO2: 96% 95%   Exam:  Constitutional:   The patient is awake, alert, and oriented x 3. No acute distress. Respiratory:   No increased work of breathing.  No wheezes, rales, or rhonchi  No tactile fremitus Cardiovascular:   Regular rate and rhythm  No murmurs, ectopy, or gallups.  No lateral PMI. No thrills. Abdomen:   Abdomen is soft, non-tender, non-distended  No hernias, masses, or organomegaly  Normoactive bowel sounds.  Musculoskeletal:     No cyanosis, clubbing, or edema  Pain in right upper extremity and shoulder. Skin:   No rashes, lesions, ulcers  palpation of skin: no induration or nodules Neurologic:   CN 2-12 intact  Sensation all 4 extremities intact Psychiatric:   Mental status o Mood, affect appropriate o Orientation to person, place, time   judgment and insight appear intact I have personally reviewed the following:   Today's Data   Vitals, BMP,Glucoses,   Echocardiogram  Micro Data   Blood Cultures x 2 (07/21/2019)  Urine culture (07/21/2019)  Blood Cultures x 2 (07/22/2019)  Scheduled Meds:  allopurinol  300 mg Oral Daily   vitamin C  500 mg Oral BID   B-complex with vitamin C  1 tablet Oral Daily   benazepril  20 mg Oral Daily   collagenase   Topical Daily   cyanocobalamin  1,000 mcg Intramuscular Weekly   enoxaparin (LOVENOX) injection  40 mg Subcutaneous F35K   folic acid  1 mg Oral Daily   insulin aspart  0-15 Units Subcutaneous TID WC   insulin aspart  0-5 Units Subcutaneous QHS   insulin aspart  3 Units Subcutaneous TID WC   insulin glargine  25 Units Subcutaneous Q24H   metoprolol tartrate  25 mg Oral BID   nystatin   Topical BID   polyethylene glycol  17 g Oral Daily   pravastatin  40 mg Oral QHS   [START ON 07/28/2019] Vitamin D (Ergocalciferol)  50,000 Units Oral Q Mon   zinc sulfate  220 mg Oral QHS   Continuous Infusions:  lactated ringers 100 mL/hr at 07/26/19 0934   vancomycin 750 mg (07/26/19 0147)    Principal Problem:   MRSA bacteremia Active Problems:   AKI (acute kidney injury) ([place])   Type II diabetes mellitus with renal manifestations ([place])   HTN (hypertension)   Paranoid schizophrenia ([place])   Hyperlipidemia associated with type 2 diabetes mellitus (HCC)   Pressure ulcer   Sepsis ([place])   Long Q-T syndrome   DKA (diabetic ketoacidoses) (HCC)   Rhabdomyolysis   Acute metabolic encephalopathy   Enterococcal bacteremia    Vitiligo   LOS: 4 days   A & P  Sepsis: Resolved. Due to MRSA and enterococcal bacteremia. The patient presented with fever, tachycardia, lactic acidemia and encephalopathy/AKI in setting of probably infected sacral ulcer, otherwise source unclear. Encephalopathy appears to have cleared.  MRSA and Enterococcal Bacteremia: Likely as a result of UTI and sacral decubitus ulcer. The patient is currently on vancomycin. I appreciate infectious disease's assistance. Echocardiogram has demonstrated an EF of 65-70%. LV normal function with mild concentric left ventricular hypertrophy. There is evidence of Grade I diastolic dysfunction. RV systolic function and size are normal. Indeterminate number of Aortic valve  cusps are present. No aortic stenosis. There is no evidence of valvular vegetations. TEE is recommended. I have ordered this. Surveillance cultures drawn on 07/22/2019 and 07/23/2019 have again grown out GPC. They have been repeated. She cannot have PICC placed, and therefore cannot discharge until they have had no growth for 48-72 hours. The patient is receiving Vancomycin and Zyvox.  DKA: Resolved. Multifactorial. Acute infection/bacteremia in setting of dehydration and lack of access to insulin for 1-2 days while she was down. The patient is currently receiving Lantus 25 units daily with FSBS and SSI and 3 units of novolog with meals. Glucoses have run from 136 - 327 in the last 24 hours.  Rhabdomyolysis: Resolving. Mild, likely too low to be cause of AKI, which is more likely from prerenal injury in sepsis. Monitor and continue IV fluids until CK is normalized.  Hypokalemia/Hypophosphatemia: Supplement and monitor.  AKI: Resolved. Creatinine was 1.42 on admission. Creatinine 0.57 this morning after IV fluids. Continue to monitor creatinine, electrolytes, and volume status.   Acute metabolic encephalopathy: Resolved. Continue to hold Risperdal, gabapentin for now.  Stage II sacral pressure  ulcer (POA): Wound care consulted. Likely source of MRSA bacteremia.   Hypertension: The patient's blood pressures are increasing. Will restart benazapril. Will restart other home antihypertensives as necessary to control blood pressures. normotensive despite lasix, nadolol, amlodipine, and benazapril being held. Hold Lasix, nadolol, amlodippine, and benazepril being held.  Asthma: Noted. Stable and quiescent.  Schizoaffective disorder: Hold Risperdal for now  I have seen and examined this patient myself. I have spent 34 minutes in her evaluation and care.  DVT prophylaxis: Lovenox CODE STATUS: Full Code FAMILY Communication: I have discussed the patient in detail with the patient's daughter Margreta Journey. All questions answered to the best of my ability. Disposition: TBD. Barriers to discharge includes continued growth of GPC from surveillance cultures and need for TEE. She will need PICC after than and to be set up for outpatient antibiotics. Status is: Inpatient  Remains inpatient appropriate because: Inpatient level of care appropriate due to severity of illness.   Dispo: The patient is from: Home  Anticipated d/c is to: TBD  Anticipated d/c date is: 3 days  Patient currently is not medically stable to d/c. Barriers to discharge includes continued growth of GPC from surveillance cultures and need for TEE.  Caroljean Monsivais, DO [facility] Direct contact: see [email or web address]  7PM-7AM contact night coverage as above 07/26/2019, 12:25 PM  LOS: 1 day

## 2019-07-27 LAB — BASIC METABOLIC PANEL
Anion gap: 8 (ref 5–15)
BUN: 7 mg/dL — ABNORMAL LOW (ref 8–23)
CO2: 28 mmol/L (ref 22–32)
Calcium: 8 mg/dL — ABNORMAL LOW (ref 8.9–10.3)
Chloride: 102 mmol/L (ref 98–111)
Creatinine, Ser: 0.53 mg/dL (ref 0.44–1.00)
GFR calc Af Amer: >60 mL/min (ref >60)
GFR calc non Af Amer: >60 mL/min (ref >60)
Glucose, Bld: 87 mg/dL (ref 70–99)
Potassium: 3.2 mmol/L — ABNORMAL LOW (ref 3.5–5.1)
Sodium: 138 mmol/L (ref 135–145)

## 2019-07-27 LAB — CBC
HCT: 25.9 % — ABNORMAL LOW (ref 36.0–46.0)
Hemoglobin: 8.4 g/dL — ABNORMAL LOW (ref 12.0–15.0)
MCH: 29.4 pg (ref 26.0–34.0)
MCHC: 32.4 g/dL (ref 30.0–36.0)
MCV: 90.6 fL (ref 80.0–100.0)
Platelets: 157 10*3/uL (ref 150–400)
RBC: 2.86 MIL/uL — ABNORMAL LOW (ref 3.87–5.11)
RDW: 14.1 % (ref 11.5–15.5)
WBC: 13.2 10*3/uL — ABNORMAL HIGH (ref 4.0–10.5)
nRBC: 0.5 % — ABNORMAL HIGH (ref 0.0–0.2)

## 2019-07-27 LAB — GLUCOSE, CAPILLARY
Glucose-Capillary: 84 mg/dL (ref 70–99)
Glucose-Capillary: 149 mg/dL — ABNORMAL HIGH (ref 70–99)
Glucose-Capillary: 151 mg/dL — ABNORMAL HIGH (ref 70–99)
Glucose-Capillary: 88 mg/dL (ref 70–99)

## 2019-07-27 LAB — VANCOMYCIN, TROUGH: Vancomycin Tr: 14 ug/mL — ABNORMAL LOW (ref 15–20)

## 2019-07-27 MED ORDER — GERHARDT'S BUTT CREAM
TOPICAL_CREAM | CUTANEOUS | Status: DC | PRN
  Filled 2019-07-27: qty 1

## 2019-07-27 MED ORDER — VANCOMYCIN HCL IN DEXTROSE 1-5 GM/200ML-% IV SOLN
1000.0000 mg | Freq: Two times a day (BID) | INTRAVENOUS | Status: DC
  Administered 2019-07-27 – 2019-07-31 (×9): 1000 mg via INTRAVENOUS
  Filled 2019-07-27 (×8): qty 200

## 2019-07-27 MED ORDER — AMLODIPINE BESYLATE 5 MG PO TABS
5.0000 mg | ORAL_TABLET | Freq: Every day | ORAL | Status: DC
  Administered 2019-07-27 – 2019-07-31 (×5): 5 mg via ORAL
  Filled 2019-07-27 (×5): qty 1

## 2019-07-27 NOTE — Progress Notes (Signed)
PROGRESS NOTE  Heather Petty TGG:[id number] DOB: 1950/08/12 DOA: 07/21/2019 PCP: Heather Ebbs, MD  Brief History   Heather Petty is a 69 y.o. F with hx HTN, IDDM, obesity, hx CVA, schizophrenia/depression and asthma who presented with acute AMS.  Rreportedly found by her daughter slumped over in her bathtub, family had last spoken to her 2 days ago.  EMS activated, found CBG 550, tachycardic.  In the ER, temp 103F, tachypneic and tachycardic.  Lactate 3.2, Cr 1.4 from baseline 0.9, CK 695.  CT head, chest abdomen and pelvis notable only for ulceration and fat stranding over left iliac.  Given fluids, IV insulin and empiric antibiotics and admitted to hospitalist service. DKA resolved, and CK has diminished.  The patient was transferred fron Heather Petty to [facility] on 07/22/2019 for further evaluation.   Blood cultures x 2 have grown out MRSA. One of them has grown out enterococcus and MRSA. Urine culture has grown out Klebsiella. Infectious disease has been consulted. She is receiving IV Vancomycin. Echocardiogram has demonstrated an EF of 65-70%. LV normal function with mild concentric left ventricular hypertrophy. There is evidence of Grade I diastolic dysfunction. RV systolic function and size are normal. Indeterminate number of Aortic valve cusps are present. No aortic stenosis. There is no evidence of valvular vegetations. TEE is recommended. I have ordered this.  Surveillance cultures drawn on 07/22/2019, 07/23/2019 have again grown out GPC. They have been repeated again today. PICC cannot be placed until surveillance cultures have had no growth for 48-72 hours.  Consultants  . Infectious disease . Wound care  Procedures  . None  Antibiotics   Anti-infectives (From admission, onward)   Start     Dose/Rate Route Frequency Ordered Stop   07/27/19 1430  vancomycin (VANCOCIN) IVPB 1000 mg/200 mL premix     Discontinue     1,000 mg 200 mL/hr over 60 Minutes Intravenous Every 12 hours  07/27/19 1355     07/24/19 1200  vancomycin (VANCOREADY) IVPB 750 mg/150 mL  Status:  Discontinued        750 mg 150 mL/hr over 60 Minutes Intravenous Every 12 hours 07/24/19 0925 07/27/19 1355   07/23/19 0000  vancomycin (VANCOREADY) IVPB 1250 mg/250 mL  Status:  Discontinued        1,250 mg 166.7 mL/hr over 90 Minutes Intravenous Every 24 hours 07/22/19 0252 07/24/19 0925   07/22/19 1000  ceFEPIme (MAXIPIME) 2 g in sodium chloride 0.9 % 100 mL IVPB  Status:  Discontinued        2 g 200 mL/hr over 30 Minutes Intravenous Every 12 hours 07/22/19 0252 07/22/19 1636   07/22/19 0600  metroNIDAZOLE (FLAGYL) IVPB 500 mg  Status:  Discontinued        500 mg 100 mL/hr over 60 Minutes Intravenous Every 8 hours 07/22/19 0240 07/22/19 1636   07/21/19 2200  ceFEPIme (MAXIPIME) 2 g in sodium chloride 0.9 % 100 mL IVPB        2 g 200 mL/hr over 30 Minutes Intravenous  Once 07/21/19 2148 07/21/19 2254   07/21/19 2200  metroNIDAZOLE (FLAGYL) IVPB 500 mg        500 mg 100 mL/hr over 60 Minutes Intravenous  Once 07/21/19 2148 07/21/19 2302   07/21/19 2200  vancomycin (VANCOCIN) IVPB 1000 mg/200 mL premix  Status:  Discontinued        1,000 mg 200 mL/hr over 60 Minutes Intravenous  Once 07/21/19 2148 07/21/19 2153   07/21/19 2200  vancomycin (VANCOCIN) 2,250 mg  in sodium chloride 0.9 % 500 mL IVPB        2,250 mg 250 mL/hr over 120 Minutes Intravenous  Once 07/21/19 2153 07/22/19 0058     Subjective  The patient is resting comfortably. No new complaints.  Objective   Vitals:  Vitals:   07/27/19 0748 07/27/19 1234  BP: (!) 152/57 (!) 143/67  Pulse: 76 81  Resp: (!) 24 (!) 22  Temp: 98 F (36.7 C) 98 F (36.7 C)  SpO2: 93% 92%   Exam:  Constitutional:  . The patient is awake, alert, and oriented x 3. No acute distress. Respiratory:  . No increased work of breathing. . No wheezes, rales, or rhonchi . No tactile fremitus Cardiovascular:  . Regular rate and rhythm . No murmurs, ectopy,  or gallups. . No lateral PMI. No thrills. Abdomen:  . Abdomen is soft, non-tender, non-distended . No hernias, masses, or organomegaly . Normoactive bowel sounds.  Musculoskeletal:  . No cyanosis, clubbing, or edema . Pain in right upper extremity and shoulder. Skin:  . No rashes, lesions, ulcers . palpation of skin: no induration or nodules Neurologic:  . CN 2-12 intact . Sensation all 4 extremities intact Psychiatric:  . Mental status o Mood, affect appropriate o Orientation to person, place, time  . judgment and insight appear intact I have personally reviewed the following:   Today's Data  . Vitals, BMP,Glucoses,  . Echocardiogram  Micro Data  . Blood Cultures x 2 (07/21/2019) . Urine culture (07/21/2019) . Blood Cultures x 2 (07/22/2019)  Scheduled Meds: . allopurinol  300 mg Oral Daily  . vitamin C  500 mg Oral BID  . B-complex with vitamin C  1 tablet Oral Daily  . benazepril  20 mg Oral Daily  . collagenase   Topical Daily  . cyanocobalamin  1,000 mcg Intramuscular Weekly  . enoxaparin (LOVENOX) injection  40 mg Subcutaneous Q24H  . folic acid  1 mg Oral Daily  . insulin aspart  0-15 Units Subcutaneous TID WC  . insulin aspart  0-5 Units Subcutaneous QHS  . insulin aspart  3 Units Subcutaneous TID WC  . insulin glargine  25 Units Subcutaneous Q24H  . metoprolol tartrate  25 mg Oral BID  . nystatin   Topical BID  . polyethylene glycol  17 g Oral Daily  . pravastatin  40 mg Oral QHS  . [START ON 07/28/2019] Vitamin D (Ergocalciferol)  50,000 Units Oral Q Mon  . zinc sulfate  220 mg Oral QHS   Continuous Infusions: . lactated ringers 100 mL/hr at 07/26/19 0934  . vancomycin      Principal Problem:   MRSA bacteremia Active Problems:   AKI (acute kidney injury) ([place])   Type II diabetes mellitus with renal manifestations (HCC)   HTN (hypertension)   Paranoid schizophrenia ([place])   Hyperlipidemia associated with type 2 diabetes mellitus (HCC)   Pressure  ulcer   Sepsis ([place])   Long Q-T syndrome   DKA (diabetic ketoacidoses) (HCC)   Rhabdomyolysis   Acute metabolic encephalopathy   Enterococcal bacteremia   Vitiligo   LOS: 5 days   A & P  Sepsis: Resolved. Due to MRSA and enterococcal bacteremia. The patient presented with fever, tachycardia, lactic acidemia and encephalopathy/AKI in setting of probably infected sacral ulcer, otherwise source unclear. Encephalopathy appears to have cleared.  MRSA and Enterococcal Bacteremia: Likely as a result of UTI and sacral decubitus ulcer. The patient is currently on vancomycin. I appreciate infectious disease's assistance. Echocardiogram  has demonstrated an EF of 65-70%. LV normal function with mild concentric left ventricular hypertrophy. There is evidence of Grade I diastolic dysfunction. RV systolic function and size are normal. Indeterminate number of Aortic valve cusps are present. No aortic stenosis. There is no evidence of valvular vegetations. TEE is recommended. I have ordered this. Surveillance cultures drawn on 07/22/2019 and 07/23/2019 have again grown out GPC. They have been repeated. She cannot have PICC placed, and therefore cannot discharge until they have had no growth for 48-72 hours. The patient is receiving Vancomycin and Zyvox.  DKA: Resolved. Multifactorial. Acute infection/bacteremia in setting of dehydration and lack of access to insulin for 1-2 days while she was down. The patient is currently receiving Lantus 25 units daily with FSBS and SSI and 3 units of novolog with meals. Glucoses have run from 84-168 in the last 24 hours.  Rhabdomyolysis: Resolving. Mild, likely too low to be cause of AKI, which is more likely from prerenal injury in sepsis. Monitor and continue IV fluids until CK is normalized.  Hypokalemia/Hypophosphatemia: Supplement and monitor.  AKI: Resolved. Creatinine was 1.42 on admission. Creatinine 0.53 this morning after IV fluids. Continue to monitor creatinine,  electrolytes, and volume status.   Acute metabolic encephalopathy: Resolved. Continue to hold Risperdal, gabapentin for now.  Stage II sacral pressure ulcer (POA): Wound care consulted. Likely source of MRSA bacteremia.   Hypertension: The patient's blood pressures are increasing.  Benazapril and amlodipine restarted. Hold Lasix and nadolol.   Asthma: Noted. Stable and quiescent.  Schizoaffective disorder: Hold Risperdal for now  I have seen and examined this patient myself. I have spent 32 minutes in her evaluation and care.  DVT prophylaxis: Lovenox CODE STATUS: Full Code FAMILY Communication:None available. Disposition: TBD. Barriers to discharge includes continued growth of GPC from surveillance cultures and need for TEE. She will need PICC after than and to be set up for outpatient antibiotics. Status is: Inpatient  Remains inpatient appropriate because: Inpatient level of care appropriate due to severity of illness.   Dispo: The patient is from: Home  Anticipated d/c is to: TBD  Anticipated d/c date is: 3 days  Patient currently is not medically stable to d/c. Barriers to discharge includes continued growth of GPC from surveillance cultures and need for TEE.  Alvia Tory, DO [facility] Direct contact: see [email or web address]  7PM-7AM contact night coverage as above 07/27/2019, 4:19 PM  LOS: 1 day

## 2019-07-27 NOTE — Progress Notes (Signed)
Pharmacy Antibiotic Note  Heather Petty is a 69 y.o. female admitted on 07/21/2019 with MRSA / enterococcal bacteremia.  Pharmacy has been consulted for vancomycin dosing. Patient has been receiving vancomycin 784m IV q12h. Blood cultures from 6/18 are currently no growth to date. Scr has remained stable. UOp 0.7 ml/kg/hr. WBC 13.2 - slight increase. Tmax 100.   Vancomycin trough obtained today found to be 14 was obtained 1 hour early is subtherapeutic based on goal trough 15-20 mcg/ml.    Plan: - Increase vancomycin to 10095mIV q12h  - Monitor renal function, repeat blood cultures, and clinical progression   Height: 5' 1"  (154.9 cm) Weight: 110 kg (242 lb 8.1 oz) IBW/kg (Calculated) : 47.8  Temp (24hrs), Avg:98.5 F (36.9 C), Min:98 F (36.7 C), Max:100 F (37.8 C)  Recent Labs  Lab 07/21/19 2205 07/22/19 0009 07/22/19 0238 07/22/19 0403 07/22/19 [id number]/16/21 0501 07/24/19 0420 07/25/19 0740 07/26/19 0358 07/27/19 0718  WBC 10.5  --   --  10.7*  --  8.4 9.8  --   --  13.2*  CREATININE 1.42*  --    < > 0.93   < > 0.88 0.73 0.68 0.57 0.53  LATICACIDVEN 3.2* 1.9  --   --   --   --   --   --   --   --    < > = values in this interval not displayed.    Estimated Creatinine Clearance: 75.1 mL/min (by C-G formula based on SCr of 0.53 mg/dL).    Allergies  Allergen Reactions  . Lasix [Furosemide] Other (See Comments)    IV-LASIX ==> Reaction: Burning   Antimicrobials this Admission: Vancomycin 6/14>> Cefepime 6/14>>6/15 Flagyl 6/14>>6/15  Cultures this Admission:  6/14 BCID: MRSA, Enterococcus 6/14 Ucx: 80,000 GNR - pending 6/14 Bcx: 2/2 MRSA per BCID  6/15 Bcx - 2/2 staph aureus   Dose Adjustments this Admission:  6/17: vancomycin adjusted from 125049mV q24h changed to 750m73m q12h for goal trough 15-20 mcg/ml 6/20: vancomycin adjusted from 750mg101mq12h to 1000mg 30m12h based on vanc trough   Thank you for allowing pharmacy to be a part of this patient's  care.  Judaea Burgoon Cristela FeltmD PGY1 Pharmacy Resident [employer]: [phone]

## 2019-07-28 ENCOUNTER — Inpatient Hospital Stay: Payer: Self-pay

## 2019-07-28 LAB — BASIC METABOLIC PANEL
Anion gap: 9 (ref 5–15)
BUN: 7 mg/dL — ABNORMAL LOW (ref 8–23)
CO2: 28 mmol/L (ref 22–32)
Calcium: 7.8 mg/dL — ABNORMAL LOW (ref 8.9–10.3)
Chloride: 100 mmol/L (ref 98–111)
Creatinine, Ser: 0.65 mg/dL (ref 0.44–1.00)
GFR calc Af Amer: >60 mL/min (ref >60)
GFR calc non Af Amer: >60 mL/min (ref >60)
Glucose, Bld: 66 mg/dL — ABNORMAL LOW (ref 70–99)
Potassium: 3.1 mmol/L — ABNORMAL LOW (ref 3.5–5.1)
Sodium: 137 mmol/L (ref 135–145)

## 2019-07-28 LAB — URINALYSIS, ROUTINE W REFLEX MICROSCOPIC
Bacteria, UA: NONE SEEN
Bilirubin Urine: NEGATIVE
Glucose, UA: NEGATIVE mg/dL
Hgb urine dipstick: NEGATIVE
Ketones, ur: NEGATIVE mg/dL
Leukocytes,Ua: NEGATIVE
Nitrite: NEGATIVE
Protein, ur: 30 mg/dL — AB
Specific Gravity, Urine: 1.014 (ref 1.005–1.030)
pH: 7 (ref 5.0–8.0)

## 2019-07-28 LAB — MAGNESIUM: Magnesium: 1.6 mg/dL — ABNORMAL LOW (ref 1.7–2.4)

## 2019-07-28 LAB — GLUCOSE, CAPILLARY
Glucose-Capillary: 65 mg/dL — ABNORMAL LOW (ref 70–99)
Glucose-Capillary: 74 mg/dL (ref 70–99)
Glucose-Capillary: 128 mg/dL — ABNORMAL HIGH (ref 70–99)
Glucose-Capillary: 178 mg/dL — ABNORMAL HIGH (ref 70–99)
Glucose-Capillary: 129 mg/dL — ABNORMAL HIGH (ref 70–99)

## 2019-07-28 MED ORDER — MAGNESIUM SULFATE 4 GM/100ML IV SOLN
4.0000 g | Freq: Once | INTRAVENOUS | Status: AC
  Administered 2019-07-28: 4 g via INTRAVENOUS
  Filled 2019-07-28: qty 100

## 2019-07-28 MED ORDER — SODIUM CHLORIDE 0.9 % IV SOLN: INTRAVENOUS | Status: DC

## 2019-07-28 NOTE — Progress Notes (Signed)
Patient ID: Heather Petty, female   DOB: 03-Sep-1950, 69 y.o.   MRN: [id number]         [facility] for Infectious Disease  Date of Admission:  07/21/2019           Day 8 vancomycin ASSESSMENT: She is improving slowly on therapy for MRSA and enterococcal bacteremia.  Repeat blood cultures are negative.  PLAN: 1. Continue vancomycin 2. TEE  Principal Problem:   MRSA bacteremia Active Problems:   Sepsis ([place])   Enterococcal bacteremia   AKI (acute kidney injury) ([place])   Type II diabetes mellitus with renal manifestations ([place])   HTN (hypertension)   Paranoid schizophrenia ([place])   Hyperlipidemia associated with type 2 diabetes mellitus ([facility])   Pressure ulcer   Long Q-T syndrome   DKA (diabetic ketoacidoses) ([facility])   Rhabdomyolysis   Acute metabolic encephalopathy   Vitiligo   Scheduled Meds: . allopurinol  300 mg Oral Daily  . amLODipine  5 mg Oral Daily  . vitamin C  500 mg Oral BID  . B-complex with vitamin C  1 tablet Oral Daily  . benazepril  20 mg Oral Daily  . collagenase   Topical Daily  . cyanocobalamin  1,000 mcg Intramuscular Weekly  . enoxaparin (LOVENOX) injection  40 mg Subcutaneous Q24H  . folic acid  1 mg Oral Daily  . insulin aspart  0-15 Units Subcutaneous TID WC  . insulin aspart  0-5 Units Subcutaneous QHS  . insulin aspart  3 Units Subcutaneous TID WC  . insulin glargine  25 Units Subcutaneous Q24H  . metoprolol tartrate  25 mg Oral BID  . nystatin   Topical BID  . polyethylene glycol  17 g Oral Daily  . pravastatin  40 mg Oral QHS  . Vitamin D (Ergocalciferol)  50,000 Units Oral Q Mon  . zinc sulfate  220 mg Oral QHS   Continuous Infusions: . lactated ringers 100 mL/hr at 07/26/19 0934  . vancomycin 1,000 mg (07/28/19 0229)   PRN Meds:.acetaminophen **OR** acetaminophen, albuterol, ALPRAZolam, bisacodyl, Gerhardt's butt cream, ibuprofen, ipratropium-albuterol, labetalol, nystatin, traMADol   SUBJECTIVE: She says that the pain in her  right shoulder is improving.  Review of Systems: Review of Systems  Constitutional: Negative for chills, diaphoresis and fever.  Respiratory: Negative for cough and shortness of breath.   Cardiovascular: Negative for chest pain.  Gastrointestinal: Negative for abdominal pain, diarrhea, nausea and vomiting.  Genitourinary: Negative for dysuria.  Musculoskeletal: Positive for joint pain.    Allergies  Allergen Reactions  . Lasix [Furosemide] Other (See Comments)    IV-LASIX ==> Reaction: Burning    OBJECTIVE: Vitals:   07/27/19 2100 07/27/19 2345 07/28/19 0351 07/28/19 0727  BP:  137/61 139/63 (!) 143/55  Pulse: 91 74 78 79  Resp: (!) 21 19 19  (!) 23  Temp: 99.2 F (37.3 C) 98.8 F (37.1 C) 98.3 F (36.8 C) 98.3 F (36.8 C)  TempSrc:  Oral Oral Oral  SpO2: (!) 89% 90% 93% 98%  Weight:      Height:       Body mass index is 45.82 kg/m.  Physical Exam Constitutional:      Comments: She is uncomfortable due to her right shoulder pain.  Cardiovascular:     Rate and Rhythm: Normal rate and regular rhythm.     Heart sounds: No murmur heard.   Pulmonary:     Effort: Pulmonary effort is normal.     Breath sounds: Normal breath sounds.  Abdominal:  Palpations: Abdomen is soft.     Tenderness: There is no abdominal tenderness.  Musculoskeletal:     Cervical back: Neck supple.  Skin:    Findings: No rash.     Lab Results Lab Results  Component Value Date   WBC 13.2 (H) 07/27/2019   HGB 8.4 (L) 07/27/2019   HCT 25.9 (L) 07/27/2019   MCV 90.6 07/27/2019   PLT 157 07/27/2019    Lab Results  Component Value Date   CREATININE 0.65 07/28/2019   BUN 7 (L) 07/28/2019   NA 137 07/28/2019   K 3.1 (L) 07/28/2019   CL 100 07/28/2019   CO2 28 07/28/2019    Lab Results  Component Value Date   ALT 34 07/23/2019   AST 53 (H) 07/23/2019   ALKPHOS 125 07/23/2019   BILITOT 1.6 (H) 07/23/2019     Microbiology: Recent Results (from the past 240 hour(s))  Culture,  blood (Routine x 2)     Status: Abnormal   Collection Time: 07/21/19 10:01 PM   Specimen: BLOOD RIGHT HAND  Result Value Ref Range Status   Specimen Description BLOOD RIGHT HAND  Final   Special Requests   Final    BOTTLES DRAWN AEROBIC ONLY Blood Culture adequate volume   Culture  Setup Time   Final    GRAM POSITIVE COCCI IN CHAINS IN CLUSTERS AEROBIC BOTTLE ONLY CRITICAL RESULT CALLED TO, READ BACK BY AND VERIFIED WITH: Jene Every PHARMD 1505 07/22/19 A BROWNING Performed at [facility], [place] [address] [place], [place] [address]    Culture (A)  Final    STAPHYLOCOCCUS AUREUS SUSCEPTIBILITIES PERFORMED ON PREVIOUS CULTURE WITHIN THE LAST 5 DAYS. ENTEROCOCCUS FAECALIS STAPHYLOCOCCUS HOMINIS    Report Status 07/25/2019 FINAL  Final   Organism ID, Bacteria ENTEROCOCCUS FAECALIS  Final   Organism ID, Bacteria STAPHYLOCOCCUS HOMINIS  Final      Susceptibility   Enterococcus faecalis - MIC*    AMPICILLIN <=2 SENSITIVE Sensitive     VANCOMYCIN 1 SENSITIVE Sensitive     GENTAMICIN SYNERGY SENSITIVE Sensitive     * ENTEROCOCCUS FAECALIS   Staphylococcus hominis - MIC*    CIPROFLOXACIN <=0.5 SENSITIVE Sensitive     ERYTHROMYCIN >=8 RESISTANT Resistant     GENTAMICIN <=0.5 SENSITIVE Sensitive     OXACILLIN <=0.25 SENSITIVE Sensitive     TETRACYCLINE >=16 RESISTANT Resistant     VANCOMYCIN 1 SENSITIVE Sensitive     TRIMETH/SULFA <=10 SENSITIVE Sensitive     CLINDAMYCIN <=0.25 SENSITIVE Sensitive     RIFAMPIN <=0.5 SENSITIVE Sensitive     Inducible Clindamycin NEGATIVE Sensitive     * STAPHYLOCOCCUS HOMINIS  Blood Culture ID Panel (Reflexed)     Status: Abnormal   Collection Time: 07/21/19 10:01 PM  Result Value Ref Range Status   Enterococcus species DETECTED (A) NOT DETECTED Final    Comment: CRITICAL RESULT CALLED TO, READ BACK BY AND VERIFIED WITH: Jene Every PHARMD 1505 07/22/19 A BROWNING    Vancomycin resistance NOT DETECTED NOT DETECTED Final   Listeria monocytogenes NOT  DETECTED NOT DETECTED Final   Staphylococcus species DETECTED (A) NOT DETECTED Final    Comment: CRITICAL RESULT CALLED TO, READ BACK BY AND VERIFIED WITH: Jene Every PHARMD 1505 07/22/19 A BROWNING    Staphylococcus aureus (BCID) DETECTED (A) NOT DETECTED Final    Comment: Methicillin (oxacillin)-resistant Staphylococcus aureus (MRSA). MRSA is predictably resistant to beta-lactam antibiotics (except ceftaroline). Preferred therapy is vancomycin unless clinically contraindicated. Patient requires contact precautions if  hospitalized. CRITICAL RESULT CALLED TO, READ BACK BY AND VERIFIED WITH: Jene Every PHARMD 1505 07/22/19 A BROWNING    Methicillin resistance DETECTED (A) NOT DETECTED Final    Comment: CRITICAL RESULT CALLED TO, READ BACK BY AND VERIFIED WITH: Jene Every PHARMD 1505 07/22/19 A BROWNING    Streptococcus species NOT DETECTED NOT DETECTED Final   Streptococcus agalactiae NOT DETECTED NOT DETECTED Final   Streptococcus pneumoniae NOT DETECTED NOT DETECTED Final   Streptococcus pyogenes NOT DETECTED NOT DETECTED Final   Acinetobacter baumannii NOT DETECTED NOT DETECTED Final   Enterobacteriaceae species NOT DETECTED NOT DETECTED Final   Enterobacter cloacae complex NOT DETECTED NOT DETECTED Final   Escherichia coli NOT DETECTED NOT DETECTED Final   Klebsiella oxytoca NOT DETECTED NOT DETECTED Final   Klebsiella pneumoniae NOT DETECTED NOT DETECTED Final   Proteus species NOT DETECTED NOT DETECTED Final   Serratia marcescens NOT DETECTED NOT DETECTED Final   Haemophilus influenzae NOT DETECTED NOT DETECTED Final   Neisseria meningitidis NOT DETECTED NOT DETECTED Final   Pseudomonas aeruginosa NOT DETECTED NOT DETECTED Final   Candida albicans NOT DETECTED NOT DETECTED Final   Candida glabrata NOT DETECTED NOT DETECTED Final   Candida krusei NOT DETECTED NOT DETECTED Final   Candida parapsilosis NOT DETECTED NOT DETECTED Final   Candida tropicalis NOT DETECTED NOT DETECTED Final     Comment: Performed at [facility], [place]. [address], [place], [place] [address]  Culture, blood (Routine x 2)     Status: Abnormal   Collection Time: 07/21/19 10:05 PM   Specimen: BLOOD RIGHT FOREARM  Result Value Ref Range Status   Specimen Description BLOOD RIGHT FOREARM  Final   Special Requests   Final    BOTTLES DRAWN AEROBIC ONLY Blood Culture adequate volume   Culture  Setup Time   Final    GRAM POSITIVE COCCI IN CLUSTERS AEROBIC BOTTLE ONLY CRITICAL VALUE NOTED.  VALUE IS CONSISTENT WITH PREVIOUSLY REPORTED AND CALLED VALUE. Performed at [facility], [place] [address] [place], [place] [address]    Culture METHICILLIN RESISTANT STAPHYLOCOCCUS AUREUS (A)  Final   Report Status 07/24/2019 FINAL  Final   Organism ID, Bacteria METHICILLIN RESISTANT STAPHYLOCOCCUS AUREUS  Final      Susceptibility   Methicillin resistant staphylococcus aureus - MIC*    CIPROFLOXACIN >=8 RESISTANT Resistant     ERYTHROMYCIN >=8 RESISTANT Resistant     GENTAMICIN <=0.5 SENSITIVE Sensitive     OXACILLIN >=4 RESISTANT Resistant     TETRACYCLINE <=1 SENSITIVE Sensitive     VANCOMYCIN <=0.5 SENSITIVE Sensitive     TRIMETH/SULFA <=10 SENSITIVE Sensitive     CLINDAMYCIN <=0.25 SENSITIVE Sensitive     RIFAMPIN <=0.5 SENSITIVE Sensitive     Inducible Clindamycin NEGATIVE Sensitive     * METHICILLIN RESISTANT STAPHYLOCOCCUS AUREUS  Urine culture     Status: Abnormal   Collection Time: 07/21/19 10:07 PM   Specimen: Urine, Catheterized  Result Value Ref Range Status   Specimen Description URINE, CATHETERIZED  Final   Special Requests   Final    NONE Performed at [facility], [place] [address] [place], [place] [address]    Culture 80,000 COLONIES/mL KLEBSIELLA PNEUMONIAE (A)  Final   Report Status 07/23/2019 FINAL  Final   Organism ID, Bacteria KLEBSIELLA PNEUMONIAE (A)  Final      Susceptibility   Klebsiella pneumoniae - MIC*    AMPICILLIN >=32 RESISTANT Resistant     CEFAZOLIN <=4  SENSITIVE  Sensitive     CEFTRIAXONE <=0.25 SENSITIVE Sensitive     CIPROFLOXACIN <=0.25 SENSITIVE Sensitive     GENTAMICIN <=1 SENSITIVE Sensitive     IMIPENEM <=0.25 SENSITIVE Sensitive     NITROFURANTOIN 32 SENSITIVE Sensitive     TRIMETH/SULFA <=20 SENSITIVE Sensitive     AMPICILLIN/SULBACTAM 4 SENSITIVE Sensitive     PIP/TAZO <=4 SENSITIVE Sensitive     * 80,000 COLONIES/mL KLEBSIELLA PNEUMONIAE  SARS Coronavirus 2 by RT PCR (hospital order, performed in [facility] hospital lab) Nasopharyngeal Nasopharyngeal Swab     Status: None   Collection Time: 07/21/19 10:17 PM   Specimen: Nasopharyngeal Swab  Result Value Ref Range Status   SARS Coronavirus 2 NEGATIVE NEGATIVE Final    Comment: (NOTE) SARS-CoV-2 target nucleic acids are NOT DETECTED.  The SARS-CoV-2 RNA is generally detectable in upper and lower respiratory specimens during the acute phase of infection. The lowest concentration of SARS-CoV-2 viral copies this assay can detect is 250 copies / mL. A negative result does not preclude SARS-CoV-2 infection and should not be used as the sole basis for treatment or other patient management decisions.  A negative result may occur with improper specimen collection / handling, submission of specimen other than nasopharyngeal swab, presence of viral mutation(s) within the areas targeted by this assay, and inadequate number of viral copies (<250 copies / mL). A negative result must be combined with clinical observations, patient history, and epidemiological information.  Fact Sheet for Patients:   StrictlyIdeas.no  Fact Sheet for Healthcare Providers: BankingDealers.co.za  This test is not yet approved or  cleared by the [place] FDA and has been authorized for detection and/or diagnosis of SARS-CoV-2 by FDA under an Emergency Use Authorization (EUA).  This EUA will remain in effect (meaning this test can be used) for the  duration of the COVID-19 declaration under Section 564(b)(1) of the Act, 21 U.S.C. section 360bbb-3(b)(1), unless the authorization is terminated or revoked sooner.  Performed at [facility], [place]  [address], [place], [place] [address]   Respiratory Panel by PCR     Status: None   Collection Time: 07/22/19 12:58 AM   Specimen: Nasopharyngeal Swab; Respiratory  Result Value Ref Range Status   Adenovirus NOT DETECTED NOT DETECTED Final   Coronavirus 229E NOT DETECTED NOT DETECTED Final    Comment: (NOTE) The Coronavirus on the Respiratory Panel, DOES NOT test for the novel  Coronavirus (2019 nCoV)    Coronavirus HKU1 NOT DETECTED NOT DETECTED Final   Coronavirus NL63 NOT DETECTED NOT DETECTED Final   Coronavirus OC43 NOT DETECTED NOT DETECTED Final   Metapneumovirus NOT DETECTED NOT DETECTED Final   Rhinovirus / Enterovirus NOT DETECTED NOT DETECTED Final   Influenza A NOT DETECTED NOT DETECTED Final   Influenza B NOT DETECTED NOT DETECTED Final   Parainfluenza Virus 1 NOT DETECTED NOT DETECTED Final   Parainfluenza Virus 2 NOT DETECTED NOT DETECTED Final   Parainfluenza Virus 3 NOT DETECTED NOT DETECTED Final   Parainfluenza Virus 4 NOT DETECTED NOT DETECTED Final   Respiratory Syncytial Virus NOT DETECTED NOT DETECTED Final   Bordetella pertussis NOT DETECTED NOT DETECTED Final   Chlamydophila pneumoniae NOT DETECTED NOT DETECTED Final   Mycoplasma pneumoniae NOT DETECTED NOT DETECTED Final    Comment: Performed at [facility], [place]. [address], [place], [place] [address]  Culture, blood (routine x 2)     Status: Abnormal   Collection Time: 07/22/19  8:11 PM   Specimen:  BLOOD  Result Value Ref Range Status   Specimen Description BLOOD RIGHT ANTECUBITAL  Final   Special Requests   Final    BOTTLES DRAWN AEROBIC ONLY Blood Culture results may not be optimal due to an inadequate volume of blood received in culture bottles   Culture  Setup Time   Final    GRAM  POSITIVE COCCI IN CLUSTERS AEROBIC BOTTLE ONLY CRITICAL RESULT CALLED TO, READ BACK BY AND VERIFIED WITH: J. FRENS PHrmd, at 1353 07/23/19 by d. vanhook    Culture (A)  Final    STAPHYLOCOCCUS AUREUS SUSCEPTIBILITIES PERFORMED ON PREVIOUS CULTURE WITHIN THE LAST 5 DAYS. Performed at [facility], [place] [address], [place], [place] [address]    Report Status 07/25/2019 FINAL  Final  Culture, blood (routine x 2)     Status: Abnormal   Collection Time: 07/22/19  8:26 PM   Specimen: BLOOD LEFT HAND  Result Value Ref Range Status   Specimen Description BLOOD LEFT HAND  Final   Special Requests   Final    BOTTLES DRAWN AEROBIC ONLY Blood Culture results may not be optimal due to an inadequate volume of blood received in culture bottles   Culture  Setup Time   Final    GRAM POSITIVE COCCI IN CLUSTERS AEROBIC BOTTLE ONLY CRITICAL VALUE NOTED.  VALUE IS CONSISTENT WITH PREVIOUSLY REPORTED AND CALLED VALUE.    Culture (A)  Final    STAPHYLOCOCCUS AUREUS SUSCEPTIBILITIES PERFORMED ON PREVIOUS CULTURE WITHIN THE LAST 5 DAYS. Performed at [facility], [place] [address], [place], [place] [address]    Report Status 07/25/2019 FINAL  Final  Culture, blood (routine x 2)     Status: Abnormal   Collection Time: 07/24/19 10:33 AM   Specimen: BLOOD LEFT ARM  Result Value Ref Range Status   Specimen Description BLOOD LEFT ARM  Final   Special Requests   Final    BOTTLES DRAWN AEROBIC ONLY Blood Culture results may not be optimal due to an inadequate volume of blood received in culture bottles   Culture  Setup Time   Final    AEROBIC BOTTLE ONLY GRAM POSITIVE COCCI IN CLUSTERS CRITICAL VALUE NOTED.  VALUE IS CONSISTENT WITH PREVIOUSLY REPORTED AND CALLED VALUE.    Culture (A)  Final    STAPHYLOCOCCUS AUREUS SUSCEPTIBILITIES PERFORMED ON PREVIOUS CULTURE WITHIN THE LAST 5 DAYS. Performed at [facility], [place] [address], [place], [place] [address]    Report Status 07/26/2019 FINAL   Final  Culture, blood (routine x 2)     Status: Abnormal   Collection Time: 07/24/19 10:33 AM   Specimen: BLOOD LEFT HAND  Result Value Ref Range Status   Specimen Description BLOOD LEFT HAND  Final   Special Requests   Final    BOTTLES DRAWN AEROBIC ONLY Blood Culture results may not be optimal due to an inadequate volume of blood received in culture bottles   Culture  Setup Time   Final    AEROBIC BOTTLE ONLY GRAM POSITIVE COCCI IN CLUSTERS CRITICAL VALUE NOTED.  VALUE IS CONSISTENT WITH PREVIOUSLY REPORTED AND CALLED VALUE.    Culture (A)  Final    STAPHYLOCOCCUS AUREUS SUSCEPTIBILITIES PERFORMED ON PREVIOUS CULTURE WITHIN THE LAST 5 DAYS. Performed at [facility], [place] [address] [place], [place] [address]    Report Status 07/26/2019 FINAL  Final  Culture, blood (routine x 2)     Status: None (Preliminary result)   Collection Time: 07/25/19  1:25 PM   Specimen: BLOOD  Result Value Ref Range Status   Specimen Description BLOOD LEFT ANTECUBITAL  Final   Special Requests   Final    BOTTLES DRAWN AEROBIC AND ANAEROBIC Blood Culture adequate volume   Culture   Final    NO GROWTH 3 DAYS Performed at [facility], [address] [place], [place] [address]    Report Status PENDING  Incomplete  Culture, blood (routine x 2)     Status: None (Preliminary result)   Collection Time: 07/25/19  1:30 PM   Specimen: BLOOD LEFT HAND  Result Value Ref Range Status   Specimen Description BLOOD LEFT HAND  Final   Special Requests   Final    BOTTLES DRAWN AEROBIC AND ANAEROBIC Blood Culture adequate volume   Culture   Final    NO GROWTH 3 DAYS Performed at [facility], [place] [address] [place], [place] [address]    Report Status PENDING  Incomplete    Michel Bickers, MD [facility] [place] [facility] [phone] pager   [phone] cell 07/28/2019, 9:20 AM

## 2019-07-28 NOTE — NC FL2 (Signed)
[place] LEVEL OF CARE SCREENING TOOL     IDENTIFICATION  Patient Name: Heather Petty Birthdate: [birth year] Sex: female Admission Date (Current Location): 07/21/2019  [facility] and [place] Number:  Herbalist and Address:  [facility] [place]. [facility], [place] [address] [place], [place] [address]      Provider Number: [id number]  Attending Physician Name and Address:  Karie Kirks, DO  Relative Name and Phone Number:  [name], [place]    Current Level of Care: Hospital Recommended Level of Care: [place] Prior Approval Number:    Date Approved/Denied:   PASRR Number:    Discharge Plan: SNF    Current Diagnoses: Patient Active Problem List   Diagnosis Date Noted  . DKA (diabetic ketoacidoses) ([place]) 07/22/2019  . Rhabdomyolysis 07/22/2019  . Acute metabolic encephalopathy 25/49/8264  . MRSA bacteremia 07/22/2019  . Enterococcal bacteremia 07/22/2019  . Vitiligo 07/22/2019  . 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 Sepsis ([place]) 07/19/2018  . Ventricular tachycardia, sustained ([place]) 07/19/2018  . NSTEMI (non-ST elevated myocardial infarction) ([place]) 07/19/2018  . Wound infection 04/03/2018  . Morbid obesity with BMI of 50.0-59.9, adult ([place]) 04/03/2018  . Elevated alkaline phosphatase level 04/03/2018  . Asthma exacerbation 03/14/2018  . Hypotension 03/14/2018  . Lactic acidosis 03/14/2018  . GERD (gastroesophageal reflux disease) 03/14/2018  . Acute renal failure superimposed on stage 3 chronic kidney disease ([place]) 03/14/2018  . Macrocytic anemia 03/14/2018  . Abnormal LFTs 03/14/2018  . Acute kidney injury ([place]) 02/28/2018  . Wound of sacral region 02/28/2018  . Hypothermia 02/28/2018  . Stage II pressure ulcer of sacral region ([place]) 02/15/2018  . DM II (diabetes mellitus, type II), controlled ([place]) 02/09/2018  . Schizophrenia ([place]) 02/09/2018  . Pressure ulcer 02/06/2018  . Acute renal failure (ARF) ([place])  02/05/2018  . Hyperlipidemia associated with type 2 diabetes mellitus ([place]) 02/05/2018  . Hyperkalemia 02/05/2018  . Dermatitis 02/05/2018  . Open back wound 02/05/2018  . Dysuria 02/05/2018  . Paranoid schizophrenia ([place])   . AKI (acute kidney injury) ([place]) 12/17/2017  . Type II diabetes mellitus with renal manifestations ([place]) 12/17/2017  . HTN (hypertension) 12/17/2017  . Gout     Orientation RESPIRATION BLADDER Height & Weight     Self, Time, Situation, Place  Normal Continent Weight: 242 lb 8.1 oz (110 kg) Height:  5' 1"  (154.9 cm)  BEHAVIORAL SYMPTOMS/MOOD NEUROLOGICAL BOWEL NUTRITION STATUS      Continent Diet  AMBULATORY STATUS COMMUNICATION OF NEEDS Skin   Extensive Assist Verbally Normal                       Personal Care Assistance Level of Assistance  Bathing, Feeding, Dressing Bathing Assistance: Limited assistance Feeding assistance: Independent Dressing Assistance: Limited assistance     Functional Limitations Info  Sight, Hearing, Speech Sight Info: Adequate Hearing Info: Adequate Speech Info: Adequate    SPECIAL CARE FACTORS FREQUENCY  PT (By licensed PT), OT (By licensed OT)     PT Frequency: 5x week OT Frequency: 5x week            Contractures Contractures Info: Not present    Additional Factors Info  Code Status, Allergies Code Status Info: Full Allergies Info: Lasix (flurosemide)           Current Medications (07/28/2019):  This is the current hospital active medication list Current Facility-Administered Medications  Medication Dose Route Frequency Provider Last Rate Last Admin  .  acetaminophen (TYLENOL) tablet 650 mg  650 mg Oral Q6H PRN Shela Leff, MD   650 mg at 07/25/19 1433   Or  . acetaminophen (TYLENOL) suppository 650 mg  650 mg Rectal Q6H PRN Shela Leff, MD   650 mg at 07/22/19 2153  . albuterol (PROVENTIL) (2.5 MG/3ML) 0.083% nebulizer solution 2.5 mg  2.5 mg Nebulization Q6H PRN Swayze, Ava, DO       . allopurinol (ZYLOPRIM) tablet 300 mg  300 mg Oral Daily Swayze, Ava, DO   300 mg at 07/27/19 0855  . ALPRAZolam Duanne Moron) tablet 0.25 mg  0.25 mg Oral TID PRN Swayze, Ava, DO   0.25 mg at 07/27/19 2124  . amLODipine (NORVASC) tablet 5 mg  5 mg Oral Daily Swayze, Ava, DO   5 mg at 07/27/19 1851  . ascorbic acid (VITAMIN C) tablet 500 mg  500 mg Oral BID Swayze, Ava, DO   500 mg at 07/27/19 2124  . B-complex with vitamin C tablet 1 tablet  1 tablet Oral Daily Swayze, Ava, DO   1 tablet at 07/27/19 0855  . benazepril (LOTENSIN) tablet 20 mg  20 mg Oral Daily Swayze, Ava, DO   20 mg at 07/27/19 0854  . bisacodyl (DULCOLAX) EC tablet 5 mg  5 mg Oral Daily PRN Swayze, Ava, DO      . collagenase (SANTYL) ointment   Topical Daily Swayze, Ava, DO   Given at 07/27/19 0856  . cyanocobalamin ((VITAMIN B-12)) injection 1,000 mcg  1,000 mcg Intramuscular Weekly Swayze, Ava, DO   1,000 mcg at 07/25/19 1711  . enoxaparin (LOVENOX) injection 40 mg  40 mg Subcutaneous Q24H Shela Leff, MD   40 mg at 07/27/19 1558  . folic acid (FOLVITE) tablet 1 mg  1 mg Oral Daily Swayze, Ava, DO   1 mg at 07/27/19 0854  . Gerhardt's butt cream   Topical PRN Swayze, Ava, DO      . ibuprofen (ADVIL) tablet 400 mg  400 mg Oral Q6H PRN Lang Snow, FNP   400 mg at 07/24/19 1540  . insulin aspart (novoLOG) injection 0-15 Units  0-15 Units Subcutaneous TID WC Edwin Dada, MD   3 Units at 07/27/19 1852  . insulin aspart (novoLOG) injection 0-5 Units  0-5 Units Subcutaneous QHS Edwin Dada, MD   4 Units at 07/25/19 2118  . insulin aspart (novoLOG) injection 3 Units  3 Units Subcutaneous TID WC Swayze, Ava, DO   3 Units at 07/27/19 1852  . insulin glargine (LANTUS) injection 25 Units  25 Units Subcutaneous Q24H Swayze, Ava, DO   25 Units at 07/27/19 1853  . ipratropium-albuterol (DUONEB) 0.5-2.5 (3) MG/3ML nebulizer solution 3 mL  3 mL Nebulization Q6H PRN Swayze, Ava, DO      . labetalol (NORMODYNE)  injection 5 mg  5 mg Intravenous Q2H PRN Opyd, Ilene Qua, MD      . lactated ringers infusion   Intravenous Continuous Edwin Dada, MD 100 mL/hr at 07/26/19 0934 New Bag at 07/26/19 0934  . metoprolol tartrate (LOPRESSOR) tablet 25 mg  25 mg Oral BID Swayze, Ava, DO   25 mg at 07/27/19 2124  . nystatin (MYCOSTATIN/NYSTOP) topical powder 1 application  1 g Topical BID PRN Swayze, Ava, DO      . nystatin (MYCOSTATIN/NYSTOP) topical powder   Topical BID Shela Leff, MD   Given at 07/27/19 2100  . polyethylene glycol (MIRALAX / GLYCOLAX) packet 17 g  17 g Oral Daily Swayze, Ava,  DO   17 g at 07/26/19 0924  . pravastatin (PRAVACHOL) tablet 40 mg  40 mg Oral QHS Swayze, Ava, DO   40 mg at 07/27/19 2124  . traMADol (ULTRAM) tablet 50 mg  50 mg Oral Q6H PRN Swayze, Ava, DO   50 mg at 07/27/19 2124  . vancomycin (VANCOCIN) IVPB 1000 mg/200 mL premix  1,000 mg Intravenous Q12H Henri Medal, RPH 200 mL/hr at 07/28/19 0229 1,000 mg at 07/28/19 0229  . Vitamin D (Ergocalciferol) (DRISDOL) capsule 50,000 Units  50,000 Units Oral Q Mon Swayze, Ava, DO      . zinc sulfate capsule 220 mg  220 mg Oral QHS Swayze, Ava, DO   220 mg at 07/27/19 2124     Discharge Medications: Please see discharge summary for a list of discharge medications.  Relevant Imaging Results:  Relevant Lab Results:   Additional Information SS# [place] Aero Drummonds, LCSW

## 2019-07-28 NOTE — Progress Notes (Signed)
Inpatient Diabetes Program Recommendations  AACE/ADA: New Consensus Statement on Inpatient Glycemic Control (2015)  Target Ranges:  Prepandial:   less than 140 mg/dL      Peak postprandial:   less than 180 mg/dL (1-2 hours)      Critically ill patients:  140 - 180 mg/dL   Lab Results  Component Value Date   GLUCAP 74 07/28/2019   HGBA1C 10.4 (H) 07/22/2019    Review of Glycemic Control  Results for Heather Petty, Heather Petty (MRN [id number]) as of 07/28/2019 10:56  Ref. Range 07/27/2019 07:21 07/27/2019 12:31 07/27/2019 17:09 07/27/2019 21:09 07/28/2019 07:27 07/28/2019 08:23  Glucose-Capillary Latest Ref Range: 70 - 99 mg/dL 84 149 (H) 151 (H) 88 65 (L) 74     Inpatient Diabetes Program Recommendations:     Decrease Lantus to 20 units daily as patient has had lower fasting cbg's the last 2 days.    Will continue to follow while inpatient.  Thank you, Reche Dixon, RN, BSN Diabetes Coordinator Inpatient Diabetes Program [phone] (team pager from 8a-5p)

## 2019-07-28 NOTE — Progress Notes (Signed)
Secure chat Dr. Megan Salon regarding PICC order.  States agrees to proceeding with PICC placement.  Will attempt to place today or tomorrow

## 2019-07-28 NOTE — Progress Notes (Signed)
Spoke with patient regarding PICC insertion explaining the risk and benefits.  States wants to wait until tomorrow to have it done.  Would not sign consent stating that she would sign it tomorrow as she is afraid "I would forget in all"  Will follow up tomorrow.

## 2019-07-28 NOTE — Progress Notes (Signed)
RE:  Heather Petty      Date of Birth: 12-08-1950     Date:  07/28/19       To Whom It May Concern:  Please be advised that the above-named patient will require a short-term nursing home stay - anticipated 30 days or less for rehabilitation and strengthening.  The plan is for return home.

## 2019-07-28 NOTE — Progress Notes (Signed)
Physical Therapy Treatment Patient Details Name: Heather Petty MRN: [id number] DOB: Feb 12, 1950 Today's Date: 07/28/2019    History of Present Illness Pt is a 69 y.o. F with hx HTN, IDDM, obesity, hx CVA, schizophrenia/depression and asthma who presented with acute AMS. Reportedly found by her daughter slumped over in her bathtub, family had last spoken to her 2 days ago.  Pt admitted with sepsis and mild rhabdo.    PT Comments    Pt requiring max encouragement and education to participate today and still with limited participation.  Pt unable to specify why she did not want to work with therapy, just "doesn't feel like it."  She was able to sit at EOB for limited time and perform a few exercises but all with max encouragement and assist to initiate. Pt will cont to benefit from PT and with good rehab potential when she participates.    Follow Up Recommendations  SNF;Supervision/Assistance - 24 hour     Equipment Recommendations  None recommended by PT    Recommendations for Other Services       Precautions / Restrictions Precautions Precautions: Fall    Mobility  Bed Mobility Overal bed mobility: Independent Bed Mobility: Supine to Sit;Sit to Supine     Supine to sit: Mod assist;HOB elevated Sit to supine: Min assist;HOB elevated   General bed mobility comments: Min A to scoot up in bed.  Max encouragment to participate.  Required assist to scoot toward EOB.  Transfers                 General transfer comment: Pt refused  Ambulation/Gait                 Stairs             Wheelchair Mobility    Modified Rankin (Stroke Patients Only)       Balance Overall balance assessment: Needs assistance Sitting-balance support: Feet supported;Single extremity supported Sitting balance-Leahy Scale: Fair Sitting balance - Comments: Required assist to maintain balance at times, but due more to pt wanting to return to supine.  EOB for 5-8 minutes with  RN assisting with washing and applying powders       Standing balance comment: refused                            Cognition Arousal/Alertness: Awake/alert Behavior During Therapy: Flat affect Overall Cognitive Status: No family/caregiver present to determine baseline cognitive functioning                                        Exercises General Exercises - Lower Extremity Ankle Circles/Pumps: AROM;Both;Supine;10 reps Long Arc Quad: AROM;5 reps;Seated Hip ABduction/ADduction: AROM;Both;5 reps;Supine    General Comments General comments (skin integrity, edema, etc.): Pt initially declining PT despite encouragement and education on importance of OOB activity (increasing strength and decreased risk ofPNE and blood clots.) RN came to assist and pt reluctantly sat EOB but with assist and requesting to return to supine and limited exercises.  Pt stating "I don't want to , let me lay down," but once returned to supine more talkative and apologized for "giving therapy a hard time"      Pertinent Vitals/Pain Pain Assessment: No/denies pain    Home Living  Prior Function            PT Goals (current goals can now be found in the care plan section) Acute Rehab PT Goals Patient Stated Goal: feel better PT Goal Formulation: With patient Time For Goal Achievement: 08/06/19 Potential to Achieve Goals: Good Progress towards PT goals: Progressing toward goals    Frequency    Min 2X/week      PT Plan Current plan remains appropriate    Co-evaluation              AM-PAC PT "6 Clicks" Mobility   Outcome Measure  Help needed turning from your back to your side while in a flat bed without using bedrails?: A Little Help needed moving from lying on your back to sitting on the side of a flat bed without using bedrails?: A Lot Help needed moving to and from a bed to a chair (including a wheelchair)?: A Lot Help needed  standing up from a chair using your arms (e.g., wheelchair or bedside chair)?: A Lot Help needed to walk in hospital room?: A Lot Help needed climbing 3-5 steps with a railing? : A Lot 6 Click Score: 13    End of Session Equipment Utilized During Treatment: Gait belt Activity Tolerance: Patient limited by fatigue;Other (comment) (williningness to participate) Patient left: in bed;with call bell/phone within reach;with bed alarm set Nurse Communication: Mobility status PT Visit Diagnosis: Unsteadiness on feet (R26.81);Muscle weakness (generalized) (M62.81)     Time: 6384-5364 PT Time Calculation (min) (ACUTE ONLY): 18 min  Charges:  $Therapeutic Activity: 8-22 mins                     Abran Richard, PT Acute Rehab Services Pager [phone] [facility] 07/28/2019, 11:46 AM

## 2019-07-28 NOTE — Progress Notes (Signed)
PROGRESS NOTE  Heather Petty QIW:[id number] DOB: [birth year] DOA: 07/21/2019 PCP: [name], MD  Brief History   Mrs. Castell is a 69 y.o. F with hx HTN, IDDM, obesity, hx CVA, schizophrenia/depression and asthma who presented with acute AMS.  Rreportedly found by her daughter slumped over in her bathtub, family had last spoken to her 2 days ago.  EMS activated, found CBG 550, tachycardic.  In the ER, temp 103F, tachypneic and tachycardic.  Lactate 3.2, Cr 1.4 from baseline 0.9, CK 695.  CT head, chest abdomen and pelvis notable only for ulceration and fat stranding over left iliac.  Given fluids, IV insulin and empiric antibiotics and admitted to hospitalist service. DKA resolved, and CK has diminished.  The patient was transferred [facility] on 07/22/2019 for further evaluation.   Blood cultures x 2 have grown out MRSA. One of them has grown out enterococcus and MRSA. Urine culture has grown out Klebsiella. Infectious disease has been consulted. She is receiving IV Vancomycin. Echocardiogram has demonstrated an EF of 65-70%. LV normal function with mild concentric left ventricular hypertrophy. There is evidence of Grade I diastolic dysfunction. RV systolic function and size are normal. Indeterminate number of Aortic valve cusps are present. No aortic stenosis. There is no evidence of valvular vegetations. TEE is recommended. I have ordered this.  Surveillance cultures drawn on 07/22/2019, 07/23/2019 have again grown out GPC. They have been repeated again today. PICC cannot be placed until surveillance cultures have had no growth for 48-72 hours.  Consultants  . Infectious disease . Wound care  Procedures  . None  Antibiotics   Anti-infectives (From admission, onward)   Start     Dose/Rate Route Frequency Ordered Stop   07/27/19 1430  vancomycin (VANCOCIN) IVPB 1000 mg/200 mL premix     Discontinue     1,000 mg 200 mL/hr over 60 Minutes Intravenous Every 12 hours  07/27/19 1355     07/24/19 1200  vancomycin (VANCOREADY) IVPB 750 mg/150 mL  Status:  Discontinued        750 mg 150 mL/hr over 60 Minutes Intravenous Every 12 hours 07/24/19 0925 07/27/19 1355   07/23/19 0000  vancomycin (VANCOREADY) IVPB 1250 mg/250 mL  Status:  Discontinued        1,250 mg 166.7 mL/hr over 90 Minutes Intravenous Every 24 hours 07/22/19 0252 07/24/19 0925   07/22/19 1000  ceFEPIme (MAXIPIME) 2 g in sodium chloride 0.9 % 100 mL IVPB  Status:  Discontinued        2 g 200 mL/hr over 30 Minutes Intravenous Every 12 hours 07/22/19 0252 07/22/19 1636   07/22/19 0600  metroNIDAZOLE (FLAGYL) IVPB 500 mg  Status:  Discontinued        500 mg 100 mL/hr over 60 Minutes Intravenous Every 8 hours 07/22/19 0240 07/22/19 1636   07/21/19 2200  ceFEPIme (MAXIPIME) 2 g in sodium chloride 0.9 % 100 mL IVPB        2 g 200 mL/hr over 30 Minutes Intravenous  Once 07/21/19 2148 07/21/19 2254   07/21/19 2200  metroNIDAZOLE (FLAGYL) IVPB 500 mg        500 mg 100 mL/hr over 60 Minutes Intravenous  Once 07/21/19 2148 07/21/19 2302   07/21/19 2200  vancomycin (VANCOCIN) IVPB 1000 mg/200 mL premix  Status:  Discontinued        1,000 mg 200 mL/hr over 60 Minutes Intravenous  Once 07/21/19 2148 07/21/19 2153   07/21/19 2200  vancomycin (VANCOCIN) 2,250 mg  in sodium chloride 0.9 % 500 mL IVPB        2,250 mg 250 mL/hr over 120 Minutes Intravenous  Once 07/21/19 2153 07/22/19 0058     Subjective  The patient is resting comfortably. Per nursing she has complained of burning when she urinates.  Objective   Vitals:  Vitals:   07/28/19 0727 07/28/19 1147  BP: (!) 143/55 111/87  Pulse: 79   Resp: (!) 23 (!) 24  Temp: 98.3 F (36.8 C) 98.5 F (36.9 C)  SpO2: 98%    Exam:  Constitutional:  . The patient is awake, alert, and oriented x 3. No acute distress. Respiratory:  . No increased work of breathing. . No wheezes, rales, or rhonchi . No tactile fremitus Cardiovascular:  . Regular  rate and rhythm . No murmurs, ectopy, or gallups. . No lateral PMI. No thrills. Abdomen:  . Abdomen is soft, non-tender, non-distended . No hernias, masses, or organomegaly . Normoactive bowel sounds.  Musculoskeletal:  . No cyanosis, clubbing, or edema . Pain in right upper extremity and shoulder. Skin:  . No rashes, lesions, ulcers . palpation of skin: no induration or nodules Neurologic:  . CN 2-12 intact . Sensation all 4 extremities intact Psychiatric:  . Mental status o Mood, affect appropriate o Orientation to person, place, time  . judgment and insight appear intact I have personally reviewed the following:   Today's Data  . Vitals, BMP,Glucoses,  . Echocardiogram  Micro Data  . Blood Cultures x 2 (07/21/2019) . Urine culture (07/21/2019) . Blood Cultures x 2 (07/22/2019)  Scheduled Meds: . allopurinol  300 mg Oral Daily  . amLODipine  5 mg Oral Daily  . vitamin C  500 mg Oral BID  . B-complex with vitamin C  1 tablet Oral Daily  . benazepril  20 mg Oral Daily  . collagenase   Topical Daily  . cyanocobalamin  1,000 mcg Intramuscular Weekly  . enoxaparin (LOVENOX) injection  40 mg Subcutaneous Q24H  . folic acid  1 mg Oral Daily  . insulin aspart  0-15 Units Subcutaneous TID WC  . insulin aspart  0-5 Units Subcutaneous QHS  . insulin aspart  3 Units Subcutaneous TID WC  . insulin glargine  25 Units Subcutaneous Q24H  . metoprolol tartrate  25 mg Oral BID  . nystatin   Topical BID  . polyethylene glycol  17 g Oral Daily  . pravastatin  40 mg Oral QHS  . Vitamin D (Ergocalciferol)  50,000 Units Oral Q Mon  . zinc sulfate  220 mg Oral QHS   Continuous Infusions: . lactated ringers 100 mL/hr at 07/26/19 0934  . magnesium sulfate bolus IVPB    . vancomycin Stopped (07/28/19 0700)    Principal Problem:   MRSA bacteremia Active Problems:   AKI (acute kidney injury) ([place])   Type II diabetes mellitus with renal manifestations ([place])   HTN (hypertension)    Paranoid schizophrenia ([place])   Hyperlipidemia associated with type 2 diabetes mellitus ([facility])   Pressure ulcer   Sepsis ([place])   Long Q-T syndrome   DKA (diabetic ketoacidoses) ([facility])   Rhabdomyolysis   Acute metabolic encephalopathy   Enterococcal bacteremia   Vitiligo   LOS: 6 days   A & P  Sepsis: Resolved. Due to MRSA and enterococcal bacteremia. The patient presented with fever, tachycardia, lactic acidemia and encephalopathy/AKI in setting of probably infected sacral ulcer, otherwise source unclear. Encephalopathy appears to have cleared.  MRSA and Enterococcal Bacteremia: Likely as  a result of UTI and sacral decubitus ulcer. The patient is currently on vancomycin. I appreciate infectious disease's assistance. Echocardiogram has demonstrated an EF of 65-70%. LV normal function with mild concentric left ventricular hypertrophy. There is evidence of Grade I diastolic dysfunction. RV systolic function and size are normal. Indeterminate number of Aortic valve cusps are present. No aortic stenosis. There is no evidence of valvular vegetations. TEE has been recommended by ID and has been ordered. Surveillance cultures drawn on 07/22/2019 and 07/23/2019 have again grown out GPC. They have been repeated.The patient is receiving Vancomycin and Zyvox. I have checked with ID and PICC can be ordered.   Dysuria: UA is negative. Urine cultures is pending.  DKA: Resolved. Multifactorial. Acute infection/bacteremia in setting of dehydration and lack of access to insulin for 1-2 days while she was down. The patient is currently receiving Lantus 25 units daily with FSBS and SSI and 3 units of novolog with meals. Glucoses have run from 65-151 in the last 24 hours. I will stop prandial insulin due to hypoglycemia.  Rhabdomyolysis: Resolving. Mild, likely too low to be cause of AKI, which is more likely from prerenal injury in sepsis. Monitor and continue IV fluids until CK is  normalized.  Hypokalemia/Hypophosphatemia/Hypomagnesemia: Supplement and monitor.  AKI: Resolved. Creatinine was 1.42 on admission. Creatinine 0.53 this morning after IV fluids. Continue to monitor creatinine, electrolytes, and volume status.   Acute metabolic encephalopathy: Resolved. Continue to hold Risperdal, gabapentin for now.  Stage II sacral pressure ulcer (POA): Wound care consulted. Likely source of MRSA bacteremia.   Hypertension: The patient's blood pressures are increasing.  Benazapril and amlodipine restarted. Hold Lasix and nadolol.   Asthma: Noted. Stable and quiescent.  Schizoaffective disorder: Hold Risperdal for now  I have seen and examined this patient myself. I have spent 32 minutes in her evaluation and care.  DVT prophylaxis: Lovenox CODE STATUS: Full Code FAMILY Communication:None available. Disposition: TBD. Barriers to discharge includes continued growth of GPC from surveillance cultures and need for TEE. She will need PICC after than and to be set up for outpatient antibiotics. Status is: Inpatient  Remains inpatient appropriate because: Inpatient level of care appropriate due to severity of illness.   Dispo: The patient is from: Home  Anticipated d/c is to: TBD  Anticipated d/c date is: 3 days  Patient currently is not medically stable to d/c. Barriers to discharge includes need for PICC and need for TEE.  Saliyah Gillin, DO [facility] Direct contact: see [email or web address]  7PM-7AM contact night coverage as above 07/28/2019, 3:09 PM  LOS: 1 day

## 2019-07-28 NOTE — Progress Notes (Signed)
Pt had a visit from "son-in-law" and patient was found attempting to get oob to use the restroom. Thus very different from today where patient needed maximum encouragement to sit at the side of bed, or to eat.

## 2019-07-28 NOTE — TOC Progression Note (Addendum)
Transition of Care [facility]) - Progression Note    Patient Details  Name: Heather Petty MRN: [id number] Date of Birth: 06-06-1950  Transition of Care [facility]) CM/SW [place], [place] Phone Number: 07/28/2019, 1:39 PM  Clinical Narrative:     CSW uploaded updated fl2 and 30day note to ncmust.   CSW contacted daughter Margreta Journey for update about choice. Daughter is waiting on return call from [facility]. If [facility] can't take, daughter prefers [place].   CSW contacted Olivia Mackie from [place] and notified daughter wants to be called. Olivia Mackie will call daughter.  CSW updated by Olivia Mackie that pt unable to pay monthly SSI; she will follow up with supervisor and update CSW in morning  Christina contacted CSW explained that her mom needs SSI for rent and can't pay for [facility]. CSW to contact [place] to see options.   CSW contacted [place] from [place]. [place] followed up with supervisor and confirmed that they could draft a letter stating pt will be at facility for short term and needs income for rent.   Christina notified of [place] ability to draft letter and [place]'s pending decision. Will decide after updates from [facility] in morning.   Expected Discharge Plan: [place] Barriers to Discharge: Continued Medical Work up, Ship broker  Expected Discharge Plan and Services Expected Discharge Plan: [place] Choice: [place] arrangements for the past 2 months: Single Family Home                                       Social Determinants of Health (SDOH) Interventions    Readmission Risk Interventions No flowsheet data found.

## 2019-07-29 LAB — URINE CULTURE: Culture: NO GROWTH

## 2019-07-29 LAB — BASIC METABOLIC PANEL
Anion gap: 9 (ref 5–15)
BUN: 8 mg/dL (ref 8–23)
CO2: 24 mmol/L (ref 22–32)
Calcium: 7.5 mg/dL — ABNORMAL LOW (ref 8.9–10.3)
Chloride: 102 mmol/L (ref 98–111)
Creatinine, Ser: 0.7 mg/dL (ref 0.44–1.00)
GFR calc Af Amer: >60 mL/min (ref >60)
GFR calc non Af Amer: >60 mL/min (ref >60)
Glucose, Bld: 77 mg/dL (ref 70–99)
Potassium: 3.5 mmol/L (ref 3.5–5.1)
Sodium: 135 mmol/L (ref 135–145)

## 2019-07-29 LAB — GLUCOSE, CAPILLARY
Glucose-Capillary: 109 mg/dL — ABNORMAL HIGH (ref 70–99)
Glucose-Capillary: 217 mg/dL — ABNORMAL HIGH (ref 70–99)
Glucose-Capillary: 242 mg/dL — ABNORMAL HIGH (ref 70–99)
Glucose-Capillary: 75 mg/dL (ref 70–99)
Glucose-Capillary: 92 mg/dL (ref 70–99)

## 2019-07-29 MED ORDER — SODIUM CHLORIDE 0.9% FLUSH
10.0000 mL | Freq: Two times a day (BID) | INTRAVENOUS | Status: DC
  Administered 2019-07-29 – 2019-07-31 (×5): 10 mL

## 2019-07-29 MED ORDER — CHLORHEXIDINE GLUCONATE CLOTH 2 % EX PADS
6.0000 | MEDICATED_PAD | Freq: Every day | CUTANEOUS | Status: DC
  Administered 2019-07-29 – 2019-07-31 (×3): 6 via TOPICAL

## 2019-07-29 MED ORDER — SODIUM CHLORIDE 0.9% FLUSH: 10.0000 mL | INTRAVENOUS | Status: DC | PRN

## 2019-07-29 NOTE — Plan of Care (Signed)

## 2019-07-29 NOTE — TOC Progression Note (Signed)
Transition of Care [facility]) - Progression Note    Patient Details  Name: Erva Koke MRN: [id number] Date of Birth: 1950-08-07  Transition of Care [facility]) CM/SW [place], [place] Phone Number: 07/29/2019, 4:29 PM  Clinical Narrative:     CSW informed by Olivia Mackie at [place] that they can take her without using SSI check.  Pt is waiting on TEE to be complete before she can be discharged. CSW informed daughter Margreta Journey that [facility] can accept. Plan for [facility] once TEE complete and pt is medically stable.   Expected Discharge Plan: [place] Barriers to Discharge: Continued Medical Work up, Ship broker  Expected Discharge Plan and Services Expected Discharge Plan: [place] Choice: [place] arrangements for the past 2 months: Single Family Home                                       Social Determinants of Health (SDOH) Interventions    Readmission Risk Interventions No flowsheet data found.

## 2019-07-29 NOTE — Progress Notes (Signed)
    [facility] has been requested to perform a transesophageal echocardiogram on [name] for baceremia.  After careful review of history and examination, the risks and benefits of transesophageal echocardiogram have been explained including risks of esophageal damage, perforation (1:10,000 risk), bleeding, pharyngeal hematoma as well as other potential complications associated with conscious sedation including aspiration, arrhythmia, respiratory failure and death. Alternatives to treatment were discussed, questions were answered. Patient is willing to proceed.   Rannie Craney Ninfa Meeker, PA-C  07/29/2019 3:37 PM

## 2019-07-29 NOTE — Progress Notes (Signed)
PROGRESS NOTE  Heather Petty GGY:[id number] DOB: [birth year] DOA: 07/21/2019 PCP: [name], MD  Brief History   Heather Petty is a 69 y.o. F with hx HTN, IDDM, obesity, hx CVA, schizophrenia/depression and asthma who presented with acute AMS.  Rreportedly found by her daughter slumped over in her bathtub, family had last spoken to her 2 days ago.  EMS activated, found CBG 550, tachycardic.  In the ER, temp 103F, tachypneic and tachycardic.  Lactate 3.2, Cr 1.4 from baseline 0.9, CK 695.  CT head, chest abdomen and pelvis notable only for ulceration and fat stranding over left iliac.  Given fluids, IV insulin and empiric antibiotics and admitted to hospitalist service. DKA resolved, and CK has diminished.  The patient was transferred [facility] to [facility] on 07/22/2019 for further evaluation.   Blood cultures x 2 have grown out MRSA. One of them has grown out enterococcus and MRSA. Urine culture has grown out Klebsiella. Infectious disease has been consulted. She is receiving IV Vancomycin. Echocardiogram has demonstrated an EF of 65-70%. LV normal function with mild concentric left ventricular hypertrophy. There is evidence of Grade I diastolic dysfunction. RV systolic function and size are normal. Indeterminate number of Aortic valve cusps are present. No aortic stenosis. There is no evidence of valvular vegetations. TEE is recommended. I have ordered this as it is necessary in order to determine the correct duration of therapy.  Surveillance cultures drawn on 07/22/2019, 07/23/2019 have again grown out GPC. They have been repeated again today. PICC cannot be placed until surveillance cultures have had no growth for 48-72 hours.  Consultants  . Infectious disease . Wound care  Procedures  . None  Antibiotics   Anti-infectives (From admission, onward)   Start     Dose/Rate Route Frequency Ordered Stop   07/27/19 1430  vancomycin (VANCOCIN) IVPB 1000 mg/200 mL premix      Discontinue     1,000 mg 200 mL/hr over 60 Minutes Intravenous Every 12 hours 07/27/19 1355     07/24/19 1200  vancomycin (VANCOREADY) IVPB 750 mg/150 mL  Status:  Discontinued        750 mg 150 mL/hr over 60 Minutes Intravenous Every 12 hours 07/24/19 0925 07/27/19 1355   07/23/19 0000  vancomycin (VANCOREADY) IVPB 1250 mg/250 mL  Status:  Discontinued        1,250 mg 166.7 mL/hr over 90 Minutes Intravenous Every 24 hours 07/22/19 0252 07/24/19 0925   07/22/19 1000  ceFEPIme (MAXIPIME) 2 g in sodium chloride 0.9 % 100 mL IVPB  Status:  Discontinued        2 g 200 mL/hr over 30 Minutes Intravenous Every 12 hours 07/22/19 0252 07/22/19 1636   07/22/19 0600  metroNIDAZOLE (FLAGYL) IVPB 500 mg  Status:  Discontinued        500 mg 100 mL/hr over 60 Minutes Intravenous Every 8 hours 07/22/19 0240 07/22/19 1636   07/21/19 2200  ceFEPIme (MAXIPIME) 2 g in sodium chloride 0.9 % 100 mL IVPB        2 g 200 mL/hr over 30 Minutes Intravenous  Once 07/21/19 2148 07/21/19 2254   07/21/19 2200  metroNIDAZOLE (FLAGYL) IVPB 500 mg        500 mg 100 mL/hr over 60 Minutes Intravenous  Once 07/21/19 2148 07/21/19 2302   07/21/19 2200  vancomycin (VANCOCIN) IVPB 1000 mg/200 mL premix  Status:  Discontinued        1,000 mg 200 mL/hr over 60 Minutes Intravenous  Once  07/21/19 2148 07/21/19 2153   07/21/19 2200  vancomycin (VANCOCIN) 2,250 mg in sodium chloride 0.9 % 500 mL IVPB        2,250 mg 250 mL/hr over 120 Minutes Intravenous  Once 07/21/19 2153 07/22/19 0058     Subjective  The patient is resting comfortably. No new complaints.  Objective   Vitals:  Vitals:   07/29/19 0319 07/29/19 0719  BP: 129/76 (!) 142/54  Pulse:  81  Resp: 19 18  Temp: 98.5 F (36.9 C) 98.4 F (36.9 C)  SpO2:  98%   Exam:  Constitutional:  . The patient is awake, alert, and oriented x 3. No acute distress. Respiratory:  . No increased work of breathing. . No wheezes, rales, or rhonchi . No tactile  fremitus Cardiovascular:  . Regular rate and rhythm . No murmurs, ectopy, or gallups. . No lateral PMI. No thrills. Abdomen:  . Abdomen is soft, non-tender, non-distended . No hernias, masses, or organomegaly . Normoactive bowel sounds.  Musculoskeletal:  . No cyanosis, clubbing, or edema . Pain in right upper extremity and shoulder. Skin:  . No rashes, lesions, ulcers . palpation of skin: no induration or nodules Neurologic:  . CN 2-12 intact . Sensation all 4 extremities intact Psychiatric:  . Mental status o Mood, affect appropriate o Orientation to person, place, time  . judgment and insight appear intact I have personally reviewed the following:   Today's Data  . Vitals, BMP,Glucoses,  . Echocardiogram  Micro Data  . Blood Cultures x 2 (07/21/2019) . Urine culture (07/21/2019) . Blood Cultures x 2 (07/22/2019)  Scheduled Meds: . allopurinol  300 mg Oral Daily  . amLODipine  5 mg Oral Daily  . vitamin C  500 mg Oral BID  . B-complex with vitamin C  1 tablet Oral Daily  . benazepril  20 mg Oral Daily  . collagenase   Topical Daily  . cyanocobalamin  1,000 mcg Intramuscular Weekly  . enoxaparin (LOVENOX) injection  40 mg Subcutaneous Q24H  . folic acid  1 mg Oral Daily  . insulin aspart  0-15 Units Subcutaneous TID WC  . insulin aspart  0-5 Units Subcutaneous QHS  . insulin glargine  25 Units Subcutaneous Q24H  . metoprolol tartrate  25 mg Oral BID  . nystatin   Topical BID  . polyethylene glycol  17 g Oral Daily  . pravastatin  40 mg Oral QHS  . Vitamin D (Ergocalciferol)  50,000 Units Oral Q Mon  . zinc sulfate  220 mg Oral QHS   Continuous Infusions: . sodium chloride 20 mL/hr at 07/28/19 1511  . lactated ringers 100 mL/hr at 07/26/19 0934  . vancomycin 1,000 mg (07/29/19 0349)    Principal Problem:   MRSA bacteremia Active Problems:   AKI (acute kidney injury) ([place])   Type II diabetes mellitus with renal manifestations ([place])   HTN (hypertension)    Paranoid schizophrenia ([place])   Hyperlipidemia associated with type 2 diabetes mellitus (HCC)   Pressure ulcer   Sepsis ([place])   Long Q-T syndrome   DKA (diabetic ketoacidoses) (HCC)   Rhabdomyolysis   Acute metabolic encephalopathy   Enterococcal bacteremia   Vitiligo   LOS: 7 days   A & P  Sepsis: Resolved. Due to MRSA and enterococcal bacteremia. The patient presented with fever, tachycardia, lactic acidemia and encephalopathy/AKI in setting of probably infected sacral ulcer, otherwise source unclear. Encephalopathy appears to have cleared. UA is negative for evidence of UTI.  MRSA and Enterococcal  Bacteremia: Likely as a result of UTI and sacral decubitus ulcer. The patient is currently on vancomycin. I appreciate infectious disease's assistance. Echocardiogram has demonstrated an EF of 65-70%. LV normal function with mild concentric left ventricular hypertrophy. There is evidence of Grade I diastolic dysfunction. RV systolic function and size are normal. Indeterminate number of Aortic valve cusps are present. No aortic stenosis. There is no evidence of valvular vegetations. TEE has been recommended by ID and has been ordered. Surveillance cultures drawn on 07/22/2019 and 07/23/2019 have again grown out GPC. They have been repeated.The patient is receiving Vancomycin and Zyvox. I have checked with ID and PICC can be ordered.   Dysuria: UA is negative. Urine culture has had no growth.  DKA: Resolved. Multifactorial. Acute infection/bacteremia in setting of dehydration and lack of access to insulin for 1-2 days while she was down. The patient is currently receiving Lantus 25 units daily with FSBS and SSI and 3 units of novolog with meals. Glucoses have run from 75-178 in the last 24 hours. I will stop prandial insulin due to hypoglycemia.  Rhabdomyolysis: Resolving. Mild, likely too low to be cause of AKI, which is more likely from prerenal injury in sepsis. Monitor and continue IV fluids until  CK is normalized.  Hypokalemia/Hypophosphatemia/Hypomagnesemia: Supplement and monitor.  AKI: Resolved. Creatinine was 1.42 on admission. Creatinine 0.77 this morning after IV fluids. Continue to monitor creatinine, electrolytes, and volume status.   Acute metabolic encephalopathy: Resolved. Continue to hold Risperdal, gabapentin for now.  Stage II sacral pressure ulcer (POA): Wound care consulted. Likely source of MRSA bacteremia.   Hypertension: The patient's blood pressures are increasing.  Benazapril and amlodipine restarted. Hold Lasix and nadolol.   Asthma: Noted. Stable and quiescent.  Schizoaffective disorder: Hold Risperdal for now  I have seen and examined this patient myself. I have spent 32 minutes in her evaluation and care.  DVT prophylaxis: Lovenox CODE STATUS: Full Code FAMILY Communication:None available. Disposition: TBD. Barriers to discharge includes continued growth of GPC from surveillance cultures and need for TEE. She will need PICC after than and to be set up for outpatient antibiotics. Status is: Inpatient  Remains inpatient appropriate because: Inpatient level of care appropriate due to severity of illness.   Dispo: The patient is from: Home  Anticipated d/c is to: TBD  Anticipated d/c date is: 3 days  Patient currently is not medically stable to d/c. Barriers to discharge includes need for PICC and need for TEE.  Izaya Netherton, DO [facility] Direct contact: see [email or web address]  7PM-7AM contact night coverage as above 07/28/2019, 3:09 PM  LOS: 1 day

## 2019-07-29 NOTE — Progress Notes (Signed)
Pt has remained NPO since midnight for TEE today. Have attempted to call ECHO lab to see if they plan on doing it today or postponing. Unable to confirm plans. Placing diet order now for patient to be able to eat and take her meds today. A. Swayze DO in agreement per secure chat.

## 2019-07-29 NOTE — Progress Notes (Signed)
Peripherally Inserted Central Catheter Placement  The IV Nurse has discussed with the patient and/or persons authorized to consent for the patient, the purpose of this procedure and the potential benefits and risks involved with this procedure.  The benefits include less needle sticks, lab draws from the catheter, and the patient may be discharged home with the catheter. Risks include, but not limited to, infection, bleeding, blood clot (thrombus formation), and puncture of an artery; nerve damage and irregular heartbeat and possibility to perform a PICC exchange if needed/ordered by physician.  Alternatives to this procedure were also discussed.  Bard Power PICC patient education guide, fact sheet on infection prevention and patient information card has been provided to patient /or left at bedside.    PICC Placement Documentation  PICC Single Lumen 98/26/41 PICC Right Basilic 40 cm 0 cm (Active)  Indication for Insertion or Continuance of Line Home intravenous therapies (PICC only) 07/29/19 1500  Exposed Catheter (cm) 0 cm 07/29/19 1500  Site Assessment Clean;Dry;Intact 07/29/19 1500  Line Status Flushed;Saline locked;Blood return noted 07/29/19 1500  Dressing Type Transparent;Securing device 07/29/19 1500  Dressing Status Clean;Dry;Intact;Antimicrobial disc in place 07/29/19 1500  Dressing Change Due 07/29/19 07/29/19 1500       Holley Bouche [place] 07/29/2019, 3:09 PM

## 2019-07-29 NOTE — Plan of Care (Signed)
  Problem: Education: Goal: Knowledge of General Education information will improve Description: Including pain rating scale, medication(s)/side effects and non-pharmacologic comfort measures Outcome: Progressing   Problem: Health Behavior/Discharge Planning: Goal: Ability to manage health-related needs will improve Outcome: Progressing   Problem: Clinical Measurements: Goal: Ability to maintain clinical measurements within normal limits will improve Outcome: Progressing Goal: Will remain free from infection Outcome: Progressing Goal: Diagnostic test results will improve Outcome: Progressing Goal: Respiratory complications will improve Outcome: Progressing Goal: Cardiovascular complication will be avoided Outcome: Progressing   Problem: Activity: Goal: Risk for activity intolerance will decrease Outcome: Progressing   Problem: Nutrition: Goal: Adequate nutrition will be maintained Outcome: Progressing   Problem: Pain Managment: Goal: General experience of comfort will improve Outcome: Progressing   Problem: Coping: Goal: Level of anxiety will decrease Outcome: Not Progressing   Problem: Elimination: Goal: Will not experience complications related to bowel motility Outcome: Not Progressing Goal: Will not experience complications related to urinary retention Outcome: Not Progressing   Problem: Safety: Goal: Ability to remain free from injury will improve Outcome: Not Progressing   Problem: Skin Integrity: Goal: Risk for impaired skin integrity will decrease Outcome: Not Progressing

## 2019-07-29 NOTE — Progress Notes (Signed)
Patient ID: Fred Hammes, female   DOB: 1950-05-11, 69 y.o.   MRN: [id number]          [facility] for Infectious Disease    Date of Admission:  07/21/2019   Total days of antibiotics 9         Ms. Ronaldo Miyamoto is afebrile and repeat blood cultures are negative on therapy for MRSA bacteremia.  She can have a PICC line placed today.  I am waiting on TEE to help determine optimal duration of IV vancomycin therapy.         Michel Bickers, MD [facility] for Infectious [facility] [phone] pager   [phone] cell 07/29/2019, 11:11 AM

## 2019-07-30 ENCOUNTER — Other Ambulatory Visit (HOSPITAL_COMMUNITY): Payer: Medicare Other

## 2019-07-30 ENCOUNTER — Encounter (HOSPITAL_COMMUNITY)
Admission: EM | Disposition: A | Discharge status: Skilled Nursing Facility | Payer: Self-pay | Source: Home / Self Care | Attending: Internal Medicine

## 2019-07-30 ENCOUNTER — Inpatient Hospital Stay (HOSPITAL_COMMUNITY): Payer: Medicare Other | Admitting: Anesthesiology

## 2019-07-30 ENCOUNTER — Other Ambulatory Visit: Payer: Self-pay

## 2019-07-30 ENCOUNTER — Encounter (HOSPITAL_COMMUNITY): Payer: Self-pay | Admitting: Internal Medicine

## 2019-07-30 DIAGNOSIS — R7881 Bacteremia: Secondary | ICD-10-CM

## 2019-07-30 DIAGNOSIS — N179 Acute kidney failure, unspecified: Secondary | ICD-10-CM

## 2019-07-30 DIAGNOSIS — F200 Paranoid schizophrenia: Secondary | ICD-10-CM

## 2019-07-30 DIAGNOSIS — B9562 Methicillin resistant Staphylococcus aureus infection as the cause of diseases classified elsewhere: Secondary | ICD-10-CM

## 2019-07-30 DIAGNOSIS — G9341 Metabolic encephalopathy: Secondary | ICD-10-CM

## 2019-07-30 DIAGNOSIS — I10 Essential (primary) hypertension: Secondary | ICD-10-CM

## 2019-07-30 LAB — CULTURE, BLOOD (ROUTINE X 2)
Culture: NO GROWTH
Culture: NO GROWTH
Special Requests: ADEQUATE
Special Requests: ADEQUATE

## 2019-07-30 LAB — GLUCOSE, CAPILLARY
Glucose-Capillary: 121 mg/dL — ABNORMAL HIGH (ref 70–99)
Glucose-Capillary: 145 mg/dL — ABNORMAL HIGH (ref 70–99)
Glucose-Capillary: 261 mg/dL — ABNORMAL HIGH (ref 70–99)
Glucose-Capillary: 176 mg/dL — ABNORMAL HIGH (ref 70–99)

## 2019-07-30 LAB — VANCOMYCIN, TROUGH: Vancomycin Tr: 20 ug/mL (ref 15–20)

## 2019-07-30 SURGERY — CANCELLED PROCEDURE

## 2019-07-30 MED ORDER — SODIUM CHLORIDE 0.9 % IV SOLN: INTRAVENOUS | Status: DC

## 2019-07-30 NOTE — Progress Notes (Signed)
Pharmacy Antibiotic Note  Heather Petty is a 69 y.o. female admitted on 07/21/2019 with MRSA / enterococcal bacteremia.  Pharmacy has been consulted for vancomycin dosing. Patient has been receiving vancomycin 758m IV q12h. Blood cultures from 6/18 are currently no growth to date. Scr has remained stable. UOp 0.7 ml/kg/hr. WBC 13.2 - slight increase. Tmax 100.   Vanc trough came back at 20 today (goal 15-20). Pt also refused TEE today. Repeat blood cultures on 6/18 remained neg. PICC is in place. Scr remains stable at 0.7 on 6/22. She is now on D10 vanc   Plan: - Cont vancomycin 10039mIV q12h  - Monitor bmet q48    Height: 5' 1"  (154.9 cm) Weight: 110 kg (242 lb 8.1 oz) IBW/kg (Calculated) : 47.8  Temp (24hrs), Avg:99 F (37.2 C), Min:98.1 F (36.7 C), Max:100.2 F (37.9 C)  Recent Labs  Lab 07/24/19 0420 07/24/19 0420 07/25/19 0740 07/26/19 0358 07/27/19 0718 07/27/19 1247 07/28/19 0357 07/29/19 0415 07/30/19 1331  WBC 9.8  --   --   --  13.2*  --   --   --   --   CREATININE 0.73   < > 0.68 0.57 0.53  --  0.65 0.70  --   VANCOTROUGH  --   --   --   --   --  14*  --   --  20   < > = values in this interval not displayed.    Estimated Creatinine Clearance: 75.1 mL/min (by C-G formula based on SCr of 0.7 mg/dL).    Allergies  Allergen Reactions  . Lasix [Furosemide] Other (See Comments)    IV-LASIX ==> Reaction: Burning   Antimicrobials this Admission: Vancomycin 6/14>> Cefepime 6/14>>6/15 Flagyl 6/14>>6/15  Cultures this Admission:  6/14 BCID: MRSA, Enterococcus 6/14 Ucx: 80,000 GNR - pending 6/14 Bcx: 2/2 MRSA per BCID. Staph hominis 6/15 Bcx - 2/2 staph aureus  6/17 blood>>SA 6/21 blood>>ngtd  Dose Adjustments this Admission:  6/17: vancomycin adjusted from 125043mV q24h changed to 750m38m q12h for goal trough 15-20 mcg/ml 6/20: vancomycin adjusted from 750mg59mq12h to 1000mg 62m12h based on vanc trough  6/23 vanc trough 20  Heather Petty, PharmD,  BCIDP,[place], CPP Infectious Disease Pharmacist 07/30/2019 2:42 PM

## 2019-07-30 NOTE — CV Procedure (Signed)
   Procedure cancelled due to patient refusing

## 2019-07-30 NOTE — TOC Progression Note (Signed)
Transition of Care [facility]) - Progression Note    Patient Details  Name: Heather Petty MRN: [id number] Date of Birth: 13-Jul-1950  Transition of Care [facility]) CM/SW [place],  Phone Number: 07/30/2019, 1:32 PM  Clinical Narrative:   CSW completed PASRR interview with patient, pending PASRR number still at this time. CSW discussed with patient how she was feeling and how she refused TEE this morning, and patient said she was just so tired of being poked and prodded and pushed and just wanted it to stop so she could be left alone. CSW updated MD on discussion with patient, asked about appropriateness of palliative consult. CSW to follow.    Expected Discharge Plan: [place] Barriers to Discharge: Continued Medical Work up, Ship broker  Expected Discharge Plan and Services Expected Discharge Plan: [place] Choice: [place] arrangements for the past 2 months: Single Family Home                                       Social Determinants of Health (SDOH) Interventions    Readmission Risk Interventions No flowsheet data found.

## 2019-07-30 NOTE — Progress Notes (Signed)
OT Cancellation Note  Patient Details Name: Heather Petty MRN: [id number] DOB: [birth year]   Cancelled Treatment:    Reason Eval/Treat Not Completed: Patient at procedure or test/ unavailable.  Patient off floor for TEE.  Will check back as time allows.  August Luz, OTR/L   [place]hylliss Bob 07/30/2019, 9:45 AM

## 2019-07-30 NOTE — Anesthesia Preprocedure Evaluation (Addendum)
Anesthesia Evaluation    Reviewed: Allergy & Precautions, Patient's Chart, lab work & pertinent test results  Airway        Dental   Pulmonary neg pulmonary ROS, former smoker,           Cardiovascular hypertension,   IMPRESSIONS    1. Left ventricular ejection fraction, by estimation, is 65 to 70%. The  left ventricle has normal function. The left ventricle has no regional  wall motion abnormalities. There is mild concentric left ventricular  hypertrophy. Left ventricular diastolic  parameters are consistent with Grade I diastolic dysfunction (impaired  relaxation).  2. Right ventricular systolic function is normal. The right ventricular  size is normal. There is normal pulmonary artery systolic pressure. The  estimated right ventricular systolic pressure is 82.9 mmHg.  3. The mitral valve is normal in structure. No evidence of mitral valve  regurgitation. No evidence of mitral stenosis.  4. The aortic valve has an indeterminant number of cusps. Aortic valve  regurgitation is not visualized. Mild to moderate aortic valve  sclerosis/calcification is present, without any evidence of aortic  stenosis.  5. The inferior vena cava is normal in size with greater than 50%  respiratory variability, suggesting right atrial pressure of 3 mmHg.   Conclusion(s)/Recommendation(s): No evidence of valvular vegetations on  this transthoracic echocardiogram. Would recommend a transesophageal  echocardiogram to exclude infective endocarditis if clinically indicated.    Neuro/Psych Depression Schizophrenia CVA    GI/Hepatic Neg liver ROS, GERD  ,  Endo/Other  diabetesMorbid obesity  Renal/GU Renal disease     Musculoskeletal negative musculoskeletal ROS (+)   Abdominal   Peds  Hematology negative hematology ROS (+)   Anesthesia Other Findings   Reproductive/Obstetrics                              Anesthesia Physical Anesthesia Plan  ASA: III  Anesthesia Plan: MAC   Post-op Pain Management:    Induction: Intravenous  PONV Risk Score and Plan: 2 and Propofol infusion and Ondansetron  Airway Management Planned: Natural Airway  Additional Equipment:   Intra-op Plan:   Post-operative Plan:   Informed Consent:   Plan Discussed with: Anesthesiologist  Anesthesia Plan Comments: (Pt refused TEE)       Anesthesia Quick Evaluation

## 2019-07-30 NOTE — Progress Notes (Signed)
PHARMACY CONSULT NOTE FOR:  OUTPATIENT  PARENTERAL ANTIBIOTIC THERAPY (OPAT)  Indication: MRSA and Enterococcus Bacteremia  Regimen: Vancomycin 1000 mg IV Q12H End date: 09/01/2019  IV antibiotic discharge orders are pended. To discharging provider:  please sign these orders via discharge navigator,  Select New Orders & click on the button choice - Manage This Unsigned Work.     Thank you for allowing pharmacy to be a part of this patient's care.  Lorel Monaco, PharmD PGY1 Ambulatory Care Resident

## 2019-07-30 NOTE — Progress Notes (Signed)
PROGRESS NOTE  Heather Petty VCB:[id number] DOB: 12-Aug-1950 DOA: 07/21/2019 PCP: [name], MD  HPI/Recap of past 24 hours: History of morbid obesity hypertension diabetes mellitus plus schizophrenia admitted on 6/14 for confusion after being found by her daughter slumped in the bathtub.  EMS noted CBG at 550 and temp of 103.  Patient found to be in DKA plus sepsis.  With IV fluids and insulin plus antibiotics, DKA resolved and sepsis stabilized.  Blood cultures grew out MRSA +1 g Enterococcus and MRSA.  Urine culture growing out Klebsiella.  Infectious is consulted.  Echocardiogram unrevealing other than grade 1 diastolic dysfunction noted.  TEE was recommended.  Surveillance cultures on 6/15, 16 and 17 all grew out gram-positive cocci with clusters, but cultures since then have been negative.  Patient was supposed to get TEE on 6/23, but after they took her down, she refused.  Stating that she is just tired.  Infectious disease followed up and then recommended just patient needed 6 weeks of IV antibiotics then continuously.  New PICC line placed.  Assessment/Plan: Principal Problem:   MRSA bacteremia Active Problems:   AKI (acute kidney injury) ([place])   Type II diabetes mellitus with renal manifestations ([place])   HTN (hypertension)   Paranoid schizophrenia ([place])   Hyperlipidemia associated with type 2 diabetes mellitus ([facility])   Pressure ulcer   Sepsis ([place])   Long Q-T syndrome   DKA (diabetic ketoacidoses) (H[facility])   Rhabdomyolysis   Acute metabolic encephalopathy   Enterococcal bacteremia   Vitiligo Sepsis: Patient met criteria for sepsis on admission given leukocytosis lactic acidosis, tachycardia, acute kidney injury and acute metabolic encephalopathy.  qSOFA score greater than 2.  Now resolved.  Resolved. Due to MRSA and enterococcal bacteremia.  Source is felt to be secondary to infected sacral ulcer. UA is negative for evidence of UTI.  MRSA and Enterococcal Bacteremia: Likely  as a result of UTI and sacral decubitus ulcer. The patient is currently on vancomycin. I appreciate infectious disease's assistance. Echocardiogram has demonstrated an EF of 65-70%. LV normal function with mild concentric left ventricular hypertrophy. There is evidence of Grade I diastolic dysfunction. RV systolic function and size are normal. Indeterminate number of Aortic valve cusps are present. No aortic stenosis. There is no evidence of valvular vegetations. TEE has been recommended by ID and has been ordered. Surveillance cultures drawn on 07/22/2019 and 07/23/2019 have again grown out GPC. They have been repeated.The patient is receiving Vancomycin and Zyvox. I have checked with ID and PICC can be ordered.   Dysuria: UA is negative. Urine culture has had no growth.  DKA: Resolved. Multifactorial. Acute infection/bacteremia in setting of dehydration and lack of access to insulin for 1-2 days while she was down. The patient is currently receiving Lantus 25 units daily with FSBS and SSI and 3 units of novolog with meals. Glucoses have run from 75-178 in the last 24 hours. I will stop prandial insulin due to hypoglycemia.  Rhabdomyolysis: Resolving. Mild, likely too low to be cause of AKI, which is more likely from prerenal injury in sepsis. Monitor and continue IV fluids until CK is normalized.  Hypokalemia/Hypophosphatemia/Hypomagnesemia: Supplement and monitor.  AKI: Resolved. Creatinine was 1.42 on admission. Creatinine 0.77 this morning after IV fluids. Continue to monitor creatinine, electrolytes, and volume status.   Acute metabolic encephalopathy: Resolved. Continue to hold Risperdal, gabapentinfor now.  Stage II sacral pressure ulcer (POA): Wound care consulted. Likely source of MRSA bacteremia.   Hypertension: The patient's blood pressures have  been trending upward.  At times, she has blood pressure spikes as high in the 150s and had one at the 180s this morning.  May need to  start the rest of her blood pressure medications including Lasix.  Asthma: Noted. Stable and quiescent.  Morbid obesity: Meets criteria for BMI greater than 40  Schizoaffective disorder: Hold Risperdal for now  Code Status: Full code  Family Communication: Left message with daughter  Disposition Plan: Looking at skilled nursing facility options.   Consultants:  Infectious disease  Cardiology  Procedures:  Echocardiogram done diastolic dysfunction no evidence of vegetations.  TEE canceled  Antimicrobials:  IV vancomycin 6/14-present  IV Flagyl 6/14 now discontinued  IV cefepime 6/15-discontinued  DVT prophylaxis: Lovenox   Objective: Vitals:   07/30/19 0839 07/30/19 1147  BP: (!) 185/51 120/69  Pulse: 92   Resp: (!) 33 (!) 28  Temp:  98.9 F (37.2 C)  SpO2:      Intake/Output Summary (Last 24 hours) at 07/30/2019 1745 Last data filed at 07/30/2019 0820 Gross per 24 hour  Intake 360 ml  Output 1500 ml  Net -1140 ml   Filed Weights   07/21/19 2133  Weight: 110 kg   Body mass index is 45.82 kg/m.  Exam:   General: Oriented x2, no acute distress  HEENT: Normocephalic and atraumatic, mucous memories slightly dry  Cardiovascular: Regular rate and rhythm, S1-S2  Respiratory: Clear to auscultation bilaterally  Abdomen: Soft, nontender, nondistended, positive bowel sounds  Musculoskeletal: No clubbing or cyanosis, 1+ pitting edema  Psychiatry: Anxious, no evidence of acute psychosis   Data Reviewed: CBC: Recent Labs  Lab 07/24/19 0420 07/27/19 0718  WBC 9.8 13.2*  NEUTROABS 8.0*  --   HGB 10.1* 8.4*  HCT 30.8* 25.9*  MCV 91.9 90.6  PLT 119* 163   Basic Metabolic Panel: Recent Labs  Lab 07/24/19 0420 07/24/19 0420 07/25/19 0740 07/26/19 0358 07/27/19 0718 07/28/19 0357 07/29/19 0415  NA 136   < > 137 136 138 137 135  K 3.0*   < > 3.0* 3.3* 3.2* 3.1* 3.5  CL 103   < > 105 102 102 100 102  CO2 22   < > 21* _0 GLUCOSE 242*   < > 247* 174* 87 66* 77  BUN 16   < > 7* 6* 7* 7* 8  CREATININE 0.73   < > 0.68 0.57 0.53 0.65 0.70  CALCIUM 8.1*   < > 8.0* 7.9* 8.0* 7.8* 7.5*  MG 2.7*  --   --   --   --  1.6*  --   PHOS 1.7*  --   --   --   --   --   --    < > = values in this interval not displayed.   GFR: Estimated Creatinine Clearance: 75.1 mL/min (by C-G formula based on SCr of 0.7 mg/dL). Liver Function Tests: No results for input(s): AST, ALT, ALKPHOS, BILITOT, PROT, ALBUMIN in the last 168 hours. No results for input(s): LIPASE, AMYLASE in the last 168 hours. No results for input(s): AMMONIA in the last 168 hours. Coagulation Profile: No results for input(s): INR, PROTIME in the last 168 hours. Cardiac Enzymes: No results for input(s): CKTOTAL, CKMB, CKMBINDEX, TROPONINI in the last 168 hours. BNP (last 3 results) No results for input(s): PROBNP in the last 8760 hours. HbA1C: No results for input(s): HGBA1C in the last 72 hours. CBG: Recent Labs  Lab 07/29/19 2108 07/29/19 2330 07/30/19  0813 07/30/19 1146 07/30/19 1644  GLUCAP 217* 242* 121* 145* 261*   Lipid Profile: No results for input(s): CHOL, HDL, LDLCALC, TRIG, CHOLHDL, LDLDIRECT in the last 72 hours. Thyroid Function Tests: No results for input(s): TSH, T4TOTAL, FREET4, T3FREE, THYROIDAB in the last 72 hours. Anemia Panel: No results for input(s): VITAMINB12, FOLATE, FERRITIN, TIBC, IRON, RETICCTPCT in the last 72 hours. Urine analysis:    Component Value Date/Time   COLORURINE AMBER (A) 07/28/2019 1236   APPEARANCEUR CLEAR 07/28/2019 1236   LABSPEC 1.014 07/28/2019 1236   PHURINE 7.0 07/28/2019 1236   GLUCOSEU NEGATIVE 07/28/2019 1236   HGBUR NEGATIVE 07/28/2019 1236   BILIRUBINUR NEGATIVE 07/28/2019 1236   KETONESUR NEGATIVE 07/28/2019 1236   PROTEINUR 30 (A) 07/28/2019 1236   UROBILINOGEN 0.2 12/27/2010 1858   NITRITE NEGATIVE 07/28/2019 1236   LEUKOCYTESUR NEGATIVE 07/28/2019 1236   Sepsis  Labs: _0 (procalcitonin:4,lacticidven:4)  ) Recent Results (from the past 240 hour(s))  Culture, blood (Routine x 2)     Status: Abnormal   Collection Time: 07/21/19 10:01 PM   Specimen: BLOOD RIGHT HAND  Result Value Ref Range Status   Specimen Description BLOOD RIGHT HAND  Final   Special Requests   Final    BOTTLES DRAWN AEROBIC ONLY Blood Culture adequate volume   Culture  Setup Time   Final    GRAM POSITIVE COCCI IN CHAINS IN CLUSTERS AEROBIC BOTTLE ONLY CRITICAL RESULT CALLED TO, READ BACK BY AND VERIFIED WITH: Jene Every PHARMD 1505 07/22/19 A BROWNING Performed at [facility], [place] [address] [place], [place] [address]    Culture (A)  Final    STAPHYLOCOCCUS AUREUS SUSCEPTIBILITIES PERFORMED ON PREVIOUS CULTURE WITHIN THE LAST 5 DAYS. ENTEROCOCCUS FAECALIS STAPHYLOCOCCUS HOMINIS    Report Status 07/25/2019 FINAL  Final   Organism ID, Bacteria ENTEROCOCCUS FAECALIS  Final   Organism ID, Bacteria STAPHYLOCOCCUS HOMINIS  Final      Susceptibility   Enterococcus faecalis - MIC*    AMPICILLIN <=2 SENSITIVE Sensitive     VANCOMYCIN 1 SENSITIVE Sensitive     GENTAMICIN SYNERGY SENSITIVE Sensitive     * ENTEROCOCCUS FAECALIS   Staphylococcus hominis - MIC*    CIPROFLOXACIN <=0.5 SENSITIVE Sensitive     ERYTHROMYCIN >=8 RESISTANT Resistant     GENTAMICIN <=0.5 SENSITIVE Sensitive     OXACILLIN <=0.25 SENSITIVE Sensitive     TETRACYCLINE >=16 RESISTANT Resistant     VANCOMYCIN 1 SENSITIVE Sensitive     TRIMETH/SULFA <=10 SENSITIVE Sensitive     CLINDAMYCIN <=0.25 SENSITIVE Sensitive     RIFAMPIN <=0.5 SENSITIVE Sensitive     Inducible Clindamycin NEGATIVE Sensitive     * STAPHYLOCOCCUS HOMINIS  Blood Culture ID Panel (Reflexed)     Status: Abnormal   Collection Time: 07/21/19 10:01 PM  Result Value Ref Range Status   Enterococcus species DETECTED (A) NOT DETECTED Final    Comment: CRITICAL RESULT CALLED TO, READ BACK BY AND VERIFIED WITH: Jene Every PHARMD 1505  07/22/19 A BROWNING    Vancomycin resistance NOT DETECTED NOT DETECTED Final   Listeria monocytogenes NOT DETECTED NOT DETECTED Final   Staphylococcus species DETECTED (A) NOT DETECTED Final    Comment: CRITICAL RESULT CALLED TO, READ BACK BY AND VERIFIED WITH: Jene Every PHARMD 1505 07/22/19 A BROWNING    Staphylococcus aureus (BCID) DETECTED (A) NOT DETECTED Final    Comment: Methicillin (oxacillin)-resistant Staphylococcus aureus (MRSA). MRSA is predictably resistant to beta-lactam antibiotics (except ceftaroline). Preferred therapy is vancomycin unless clinically contraindicated.  Patient requires contact precautions if  hospitalized. CRITICAL RESULT CALLED TO, READ BACK BY AND VERIFIED WITH: Jene Every PHARMD 1505 07/22/19 A BROWNING    Methicillin resistance DETECTED (A) NOT DETECTED Final    Comment: CRITICAL RESULT CALLED TO, READ BACK BY AND VERIFIED WITH: Jene Every PHARMD 1505 07/22/19 A BROWNING    Streptococcus species NOT DETECTED NOT DETECTED Final   Streptococcus agalactiae NOT DETECTED NOT DETECTED Final   Streptococcus pneumoniae NOT DETECTED NOT DETECTED Final   Streptococcus pyogenes NOT DETECTED NOT DETECTED Final   Acinetobacter baumannii NOT DETECTED NOT DETECTED Final   Enterobacteriaceae species NOT DETECTED NOT DETECTED Final   Enterobacter cloacae complex NOT DETECTED NOT DETECTED Final   Escherichia coli NOT DETECTED NOT DETECTED Final   Klebsiella oxytoca NOT DETECTED NOT DETECTED Final   Klebsiella pneumoniae NOT DETECTED NOT DETECTED Final   Proteus species NOT DETECTED NOT DETECTED Final   Serratia marcescens NOT DETECTED NOT DETECTED Final   Haemophilus influenzae NOT DETECTED NOT DETECTED Final   Neisseria meningitidis NOT DETECTED NOT DETECTED Final   Pseudomonas aeruginosa NOT DETECTED NOT DETECTED Final   Candida albicans NOT DETECTED NOT DETECTED Final   Candida glabrata NOT DETECTED NOT DETECTED Final   Candida krusei NOT DETECTED NOT DETECTED Final   Candida  parapsilosis NOT DETECTED NOT DETECTED Final   Candida tropicalis NOT DETECTED NOT DETECTED Final    Comment: Performed at [facility], [place]. [address] [place], [place] [address]  Culture, blood (Routine x 2)     Status: Abnormal   Collection Time: 07/21/19 10:05 PM   Specimen: BLOOD RIGHT FOREARM  Result Value Ref Range Status   Specimen Description BLOOD RIGHT FOREARM  Final   Special Requests   Final    BOTTLES DRAWN AEROBIC ONLY Blood Culture adequate volume   Culture  Setup Time   Final    GRAM POSITIVE COCCI IN CLUSTERS AEROBIC BOTTLE ONLY CRITICAL VALUE NOTED.  VALUE IS CONSISTENT WITH PREVIOUSLY REPORTED AND CALLED VALUE. Performed at [facility], [place] [address], [place], [place] [address]    Culture METHICILLIN RESISTANT STAPHYLOCOCCUS AUREUS (A)  Final   Report Status 07/24/2019 FINAL  Final   Organism ID, Bacteria METHICILLIN RESISTANT STAPHYLOCOCCUS AUREUS  Final      Susceptibility   Methicillin resistant staphylococcus aureus - MIC*    CIPROFLOXACIN >=8 RESISTANT Resistant     ERYTHROMYCIN >=8 RESISTANT Resistant     GENTAMICIN <=0.5 SENSITIVE Sensitive     OXACILLIN >=4 RESISTANT Resistant     TETRACYCLINE <=1 SENSITIVE Sensitive     VANCOMYCIN <=0.5 SENSITIVE Sensitive     TRIMETH/SULFA <=10 SENSITIVE Sensitive     CLINDAMYCIN <=0.25 SENSITIVE Sensitive     RIFAMPIN <=0.5 SENSITIVE Sensitive     Inducible Clindamycin NEGATIVE Sensitive     * METHICILLIN RESISTANT STAPHYLOCOCCUS AUREUS  Urine culture     Status: Abnormal   Collection Time: 07/21/19 10:07 PM   Specimen: Urine, Catheterized  Result Value Ref Range Status   Specimen Description URINE, CATHETERIZED  Final   Special Requests   Final    NONE Performed at [facility], [place] [address], [place],  [address]    Culture 80,000 COLONIES/mL KLEBSIELLA PNEUMONIAE (A)  Final   Report Status 07/23/2019 FINAL  Final   Organism ID, Bacteria KLEBSIELLA PNEUMONIAE (A)  Final       Susceptibility   Klebsiella pneumoniae - MIC*    AMPICILLIN >=32 RESISTANT Resistant  CEFAZOLIN <=4 SENSITIVE Sensitive     CEFTRIAXONE <=0.25 SENSITIVE Sensitive     CIPROFLOXACIN <=0.25 SENSITIVE Sensitive     GENTAMICIN <=1 SENSITIVE Sensitive     IMIPENEM <=0.25 SENSITIVE Sensitive     NITROFURANTOIN 32 SENSITIVE Sensitive     TRIMETH/SULFA <=20 SENSITIVE Sensitive     AMPICILLIN/SULBACTAM 4 SENSITIVE Sensitive     PIP/TAZO <=4 SENSITIVE Sensitive     * 80,000 COLONIES/mL KLEBSIELLA PNEUMONIAE  SARS Coronavirus 2 by RT PCR (hospital order, performed in [place] hospital lab) Nasopharyngeal Nasopharyngeal Swab     Status: None   Collection Time: 07/21/19 10:17 PM   Specimen: Nasopharyngeal Swab  Result Value Ref Range Status   SARS Coronavirus 2 NEGATIVE NEGATIVE Final    Comment: (NOTE) SARS-CoV-2 target nucleic acids are NOT DETECTED.  The SARS-CoV-2 RNA is generally detectable in upper and lower respiratory specimens during the acute phase of infection. The lowest concentration of SARS-CoV-2 viral copies this assay can detect is 250 copies / mL. A negative result does not preclude SARS-CoV-2 infection and should not be used as the sole basis for treatment or other patient management decisions.  A negative result may occur with improper specimen collection / handling, submission of specimen other than nasopharyngeal swab, presence of viral mutation(s) within the areas targeted by this assay, and inadequate number of viral copies (<250 copies / mL). A negative result must be combined with clinical observations, patient history, and epidemiological information.  Fact Sheet for Patients:   StrictlyIdeas.no  Fact Sheet for Healthcare Providers: BankingDealers.co.za  This test is not yet approved or  cleared by the [place] FDA and has been authorized for detection and/or diagnosis of SARS-CoV-2 by FDA under an  Emergency Use Authorization (EUA).  This EUA will remain in effect (meaning this test can be used) for the duration of the COVID-19 declaration under Section 564(b)(1) of the Act, 21 U.S.C. section 360bbb-3(b)(1), unless the authorization is terminated or revoked sooner.  Performed at [facility], [place] [address], [place], [place] [address]   Respiratory Panel by PCR     Status: None   Collection Time: 07/22/19 12:58 AM   Specimen: Nasopharyngeal Swab; Respiratory  Result Value Ref Range Status   Adenovirus NOT DETECTED NOT DETECTED Final   Coronavirus 229E NOT DETECTED NOT DETECTED Final    Comment: (NOTE) The Coronavirus on the Respiratory Panel, DOES NOT test for the novel  Coronavirus (2019 nCoV)    Coronavirus HKU1 NOT DETECTED NOT DETECTED Final   Coronavirus NL63 NOT DETECTED NOT DETECTED Final   Coronavirus OC43 NOT DETECTED NOT DETECTED Final   Metapneumovirus NOT DETECTED NOT DETECTED Final   Rhinovirus / Enterovirus NOT DETECTED NOT DETECTED Final   Influenza A NOT DETECTED NOT DETECTED Final   Influenza B NOT DETECTED NOT DETECTED Final   Parainfluenza Virus 1 NOT DETECTED NOT DETECTED Final   Parainfluenza Virus 2 NOT DETECTED NOT DETECTED Final   Parainfluenza Virus 3 NOT DETECTED NOT DETECTED Final   Parainfluenza Virus 4 NOT DETECTED NOT DETECTED Final   Respiratory Syncytial Virus NOT DETECTED NOT DETECTED Final   Bordetella pertussis NOT DETECTED NOT DETECTED Final   Chlamydophila pneumoniae NOT DETECTED NOT DETECTED Final   Mycoplasma pneumoniae NOT DETECTED NOT DETECTED Final    Comment: Performed at [facility], [place]. [address] [place], [place] [address]  Culture, blood (routine x 2)     Status: Abnormal   Collection Time: 07/22/19  8:11 PM  Specimen: BLOOD  Result Value Ref Range Status   Specimen Description BLOOD RIGHT ANTECUBITAL  Final   Special Requests   Final    BOTTLES DRAWN AEROBIC ONLY Blood Culture results may not be optimal due  to an inadequate volume of blood received in culture bottles   Culture  Setup Time   Final    GRAM POSITIVE COCCI IN CLUSTERS AEROBIC BOTTLE ONLY CRITICAL RESULT CALLED TO, READ BACK BY AND VERIFIED WITH: J. FRENS PHrmd, at 1353 07/23/19 by d. vanhook    Culture (A)  Final    STAPHYLOCOCCUS AUREUS SUSCEPTIBILITIES PERFORMED ON PREVIOUS CULTURE WITHIN THE LAST 5 DAYS. Performed at [facility], [place] [address], [place], [place] [address]    Report Status 07/25/2019 FINAL  Final  Culture, blood (routine x 2)     Status: Abnormal   Collection Time: 07/22/19  8:26 PM   Specimen: BLOOD LEFT HAND  Result Value Ref Range Status   Specimen Description BLOOD LEFT HAND  Final   Special Requests   Final    BOTTLES DRAWN AEROBIC ONLY Blood Culture results may not be optimal due to an inadequate volume of blood received in culture bottles   Culture  Setup Time   Final    GRAM POSITIVE COCCI IN CLUSTERS AEROBIC BOTTLE ONLY CRITICAL VALUE NOTED.  VALUE IS CONSISTENT WITH PREVIOUSLY REPORTED AND CALLED VALUE.    Culture (A)  Final    STAPHYLOCOCCUS AUREUS SUSCEPTIBILITIES PERFORMED ON PREVIOUS CULTURE WITHIN THE LAST 5 DAYS. Performed at [facility], [place] [address], [place], [place] [address]    Report Status 07/25/2019 FINAL  Final  Culture, blood (routine x 2)     Status: Abnormal   Collection Time: 07/24/19 10:33 AM   Specimen: BLOOD LEFT ARM  Result Value Ref Range Status   Specimen Description BLOOD LEFT ARM  Final   Special Requests   Final    BOTTLES DRAWN AEROBIC ONLY Blood Culture results may not be optimal due to an inadequate volume of blood received in culture bottles   Culture  Setup Time   Final    AEROBIC BOTTLE ONLY GRAM POSITIVE COCCI IN CLUSTERS CRITICAL VALUE NOTED.  VALUE IS CONSISTENT WITH PREVIOUSLY REPORTED AND CALLED VALUE.    Culture (A)  Final    STAPHYLOCOCCUS AUREUS SUSCEPTIBILITIES PERFORMED ON PREVIOUS CULTURE WITHIN THE LAST 5 DAYS. Performed at  [facility], [place] [address] [place], [place] [address]    Report Status 07/26/2019 FINAL  Final  Culture, blood (routine x 2)     Status: Abnormal   Collection Time: 07/24/19 10:33 AM   Specimen: BLOOD LEFT HAND  Result Value Ref Range Status   Specimen Description BLOOD LEFT HAND  Final   Special Requests   Final    BOTTLES DRAWN AEROBIC ONLY Blood Culture results may not be optimal due to an inadequate volume of blood received in culture bottles   Culture  Setup Time   Final    AEROBIC BOTTLE ONLY GRAM POSITIVE COCCI IN CLUSTERS CRITICAL VALUE NOTED.  VALUE IS CONSISTENT WITH PREVIOUSLY REPORTED AND CALLED VALUE.    Culture (A)  Final    STAPHYLOCOCCUS AUREUS SUSCEPTIBILITIES PERFORMED ON PREVIOUS CULTURE WITHIN THE LAST 5 DAYS. Performed at [facility], [place] [address] [place], [place] [address]    Report Status 07/26/2019 FINAL  Final  Culture, blood (routine x 2)     Status: None   Collection Time: 07/25/19  1:25 PM   Specimen: BLOOD  Result Value Ref Range Status   Specimen Description BLOOD LEFT ANTECUBITAL  Final   Special Requests   Final    BOTTLES DRAWN AEROBIC AND ANAEROBIC Blood Culture adequate volume   Culture   Final    NO GROWTH 5 DAYS Performed at [facility], [address] [place], [place] [address]    Report Status 07/30/2019 FINAL  Final  Culture, blood (routine x 2)     Status: None   Collection Time: 07/25/19  1:30 PM   Specimen: BLOOD LEFT HAND  Result Value Ref Range Status   Specimen Description BLOOD LEFT HAND  Final   Special Requests   Final    BOTTLES DRAWN AEROBIC AND ANAEROBIC Blood Culture adequate volume   Culture   Final    NO GROWTH 5 DAYS Performed at [facility], [place] [address] [place], [place] [address]    Report Status 07/30/2019 FINAL  Final  Culture, Urine     Status: None   Collection Time: 07/28/19 12:36 PM   Specimen: Urine, Random  Result Value Ref Range Status   Specimen Description URINE,  RANDOM  Final   Special Requests NONE  Final   Culture   Final    NO GROWTH Performed at [facility], [place] [address], [place], [place] [address]    Report Status 07/29/2019 FINAL  Final      Studies: No results found.  Scheduled Meds: . allopurinol  300 mg Oral Daily  . amLODipine  5 mg Oral Daily  . vitamin C  500 mg Oral BID  . B-complex with vitamin C  1 tablet Oral Daily  . benazepril  20 mg Oral Daily  . Chlorhexidine Gluconate Cloth  6 each Topical Daily  . collagenase   Topical Daily  . cyanocobalamin  1,000 mcg Intramuscular Weekly  . enoxaparin (LOVENOX) injection  40 mg Subcutaneous Q24H  . folic acid  1 mg Oral Daily  . insulin aspart  0-15 Units Subcutaneous TID WC  . insulin aspart  0-5 Units Subcutaneous QHS  . insulin glargine  25 Units Subcutaneous Q24H  . metoprolol tartrate  25 mg Oral BID  . nystatin   Topical BID  . polyethylene glycol  17 g Oral Daily  . pravastatin  40 mg Oral QHS  . sodium chloride flush  10-40 mL Intracatheter Q12H  . Vitamin D (Ergocalciferol)  50,000 Units Oral Q Mon  . zinc sulfate  220 mg Oral QHS    Continuous Infusions: . lactated ringers 100 mL/hr at 07/26/19 0934  . vancomycin 1,000 mg (07/30/19 1506)     LOS: 8 days     Annita Brod, MD [facility]   07/30/2019, 5:45 PM

## 2019-07-30 NOTE — Progress Notes (Signed)
Patient ID: Arly Salminen, female   DOB: Aug 28, 1950, 69 y.o.   MRN: [id number]         [facility] Infectious Disease  Date of Admission:  07/21/2019           Day 10 vancomycin ASSESSMENT: She is improving slowly on therapy for MRSA and enterococcal bacteremia.  Repeat blood cultures are negative.  Her PICC has been placed.  Given her refusal to undergo TEE I favor simply continuing vancomycin for a full 6 weeks.  PLAN: 1. Continue vancomycin 2. I will sign off now  Diagnosis: Bacteremia  Culture Result: MRSA and Enterococcus  Allergies  Allergen Reactions  . Lasix [Furosemide] Other (See Comments)    IV-LASIX ==> Reaction: Burning    OPAT Orders Discharge antibiotics to be given via PICC line Discharge antibiotics: Per pharmacy protocol vancomycin Aim for Vancomycin trough 15-20 or AUC 400-550 (unless otherwise indicated) Duration: 6 weeks End Date: 09/01/2019  [facility] Care Per Protocol:  Home health RN for IV administration and teaching; PICC line care and labs.    Labs weekly while on IV antibiotics: _x_ CBC with differential _x_ BMP __ CMP __ CRP __ ESR _x_ Vancomycin trough __ CK  _x_ Please pull PIC at completion of IV antibiotics __ Please leave PIC in place until doctor has seen patient or been notified  Fax weekly labs to [phone]  Clinic Follow Up Appt: 09/01/2019  Principal Problem:   MRSA bacteremia Active Problems:   Sepsis ([place])   Enterococcal bacteremia   AKI (acute kidney injury) ([place])   Type II diabetes mellitus with renal manifestations ([place])   HTN (hypertension)   Paranoid schizophrenia ([place])   Hyperlipidemia associated with type 2 diabetes mellitus ([place])   Pressure ulcer   Long Q-T syndrome   DKA (diabetic ketoacidoses) ([facility])   Rhabdomyolysis   Acute metabolic encephalopathy   Vitiligo   Scheduled Meds: . allopurinol  300 mg Oral Daily  . amLODipine  5 mg Oral Daily  . vitamin C  500 mg Oral BID  .  B-complex with vitamin C  1 tablet Oral Daily  . benazepril  20 mg Oral Daily  . Chlorhexidine Gluconate Cloth  6 each Topical Daily  . collagenase   Topical Daily  . cyanocobalamin  1,000 mcg Intramuscular Weekly  . enoxaparin (LOVENOX) injection  40 mg Subcutaneous Q24H  . folic acid  1 mg Oral Daily  . insulin aspart  0-15 Units Subcutaneous TID WC  . insulin aspart  0-5 Units Subcutaneous QHS  . insulin glargine  25 Units Subcutaneous Q24H  . metoprolol tartrate  25 mg Oral BID  . nystatin   Topical BID  . polyethylene glycol  17 g Oral Daily  . pravastatin  40 mg Oral QHS  . sodium chloride flush  10-40 mL Intracatheter Q12H  . Vitamin D (Ergocalciferol)  50,000 Units Oral Q Mon  . zinc sulfate  220 mg Oral QHS   Continuous Infusions: . lactated ringers 100 mL/hr at 07/26/19 0934  . vancomycin 1,000 mg (07/30/19 1506)   PRN Meds:.acetaminophen **OR** acetaminophen, albuterol, ALPRAZolam, bisacodyl, Gerhardt's butt cream, ibuprofen, ipratropium-albuterol, labetalol, nystatin, sodium chloride flush, traMADol   SUBJECTIVE: She says that the pain in her right shoulder is improving.  She went down for TEE this morning but then says that she "chickened out" and refused to undergo it.  Review of Systems: Review of Systems  Constitutional: Negative for chills, diaphoresis and fever.  Respiratory: Negative for cough  and shortness of breath.   Cardiovascular: Negative for chest pain.  Gastrointestinal: Negative for abdominal pain, diarrhea, nausea and vomiting.  Genitourinary: Negative for dysuria.  Musculoskeletal: Positive for joint pain.    Allergies  Allergen Reactions  . Lasix [Furosemide] Other (See Comments)    IV-LASIX ==> Reaction: Burning    OBJECTIVE: Vitals:   07/30/19 0600 07/30/19 0819 07/30/19 0839 07/30/19 1147  BP:  (!) 156/59 (!) 185/51 120/69  Pulse: 72 85 92   Resp: (!) 21 19 (!) 33 (!) 28  Temp:  98.1 F (36.7 C)  98.9 F (37.2 C)  TempSrc:  Oral   Oral  SpO2: 95% 93%    Weight:      Height:       Body mass index is 45.82 kg/m.  Physical Exam Cardiovascular:     Rate and Rhythm: Normal rate and regular rhythm.     Heart sounds: No murmur heard.   Pulmonary:     Effort: Pulmonary effort is normal.     Breath sounds: Normal breath sounds.  Abdominal:     Palpations: Abdomen is soft.     Tenderness: There is no abdominal tenderness.  Musculoskeletal:     Cervical back: Neck supple.  Skin:    Findings: No rash.     Lab Results Lab Results  Component Value Date   WBC 13.2 (H) 07/27/2019   HGB 8.4 (L) 07/27/2019   HCT 25.9 (L) 07/27/2019   MCV 90.6 07/27/2019   PLT 157 07/27/2019    Lab Results  Component Value Date   CREATININE 0.70 07/29/2019   BUN 8 07/29/2019   NA 135 07/29/2019   K 3.5 07/29/2019   CL 102 07/29/2019   CO2 24 07/29/2019    Lab Results  Component Value Date   ALT 34 07/23/2019   AST 53 (H) 07/23/2019   ALKPHOS 125 07/23/2019   BILITOT 1.6 (H) 07/23/2019     Microbiology: Recent Results (from the past 240 hour(s))  Culture, blood (Routine x 2)     Status: Abnormal   Collection Time: 07/21/19 10:01 PM   Specimen: BLOOD RIGHT HAND  Result Value Ref Range Status   Specimen Description BLOOD RIGHT HAND  Final   Special Requests   Final    BOTTLES DRAWN AEROBIC ONLY Blood Culture adequate volume   Culture  Setup Time   Final    GRAM POSITIVE COCCI IN CHAINS IN CLUSTERS AEROBIC BOTTLE ONLY CRITICAL RESULT CALLED TO, READ BACK BY AND VERIFIED WITH: Jene Every PHARMD 1505 07/22/19 A BROWNING Performed at [facility], [place] [address] [place], [place] [address]    Culture (A)  Final    STAPHYLOCOCCUS AUREUS SUSCEPTIBILITIES PERFORMED ON PREVIOUS CULTURE WITHIN THE LAST 5 DAYS. ENTEROCOCCUS FAECALIS STAPHYLOCOCCUS HOMINIS    Report Status 07/25/2019 FINAL  Final   Organism ID, Bacteria ENTEROCOCCUS FAECALIS  Final   Organism ID, Bacteria STAPHYLOCOCCUS HOMINIS  Final       Susceptibility   Enterococcus faecalis - MIC*    AMPICILLIN <=2 SENSITIVE Sensitive     VANCOMYCIN 1 SENSITIVE Sensitive     GENTAMICIN SYNERGY SENSITIVE Sensitive     * ENTEROCOCCUS FAECALIS   Staphylococcus hominis - MIC*    CIPROFLOXACIN <=0.5 SENSITIVE Sensitive     ERYTHROMYCIN >=8 RESISTANT Resistant     GENTAMICIN <=0.5 SENSITIVE Sensitive     OXACILLIN <=0.25 SENSITIVE Sensitive     TETRACYCLINE >=16 RESISTANT Resistant     VANCOMYCIN 1 SENSITIVE  Sensitive     TRIMETH/SULFA <=10 SENSITIVE Sensitive     CLINDAMYCIN <=0.25 SENSITIVE Sensitive     RIFAMPIN <=0.5 SENSITIVE Sensitive     Inducible Clindamycin NEGATIVE Sensitive     * STAPHYLOCOCCUS HOMINIS  Blood Culture ID Panel (Reflexed)     Status: Abnormal   Collection Time: 07/21/19 10:01 PM  Result Value Ref Range Status   Enterococcus species DETECTED (A) NOT DETECTED Final    Comment: CRITICAL RESULT CALLED TO, READ BACK BY AND VERIFIED WITH: Jene Every PHARMD 1505 07/22/19 A BROWNING    Vancomycin resistance NOT DETECTED NOT DETECTED Final   Listeria monocytogenes NOT DETECTED NOT DETECTED Final   Staphylococcus species DETECTED (A) NOT DETECTED Final    Comment: CRITICAL RESULT CALLED TO, READ BACK BY AND VERIFIED WITH: Jene Every PHARMD 1505 07/22/19 A BROWNING    Staphylococcus aureus (BCID) DETECTED (A) NOT DETECTED Final    Comment: Methicillin (oxacillin)-resistant Staphylococcus aureus (MRSA). MRSA is predictably resistant to beta-lactam antibiotics (except ceftaroline). Preferred therapy is vancomycin unless clinically contraindicated. Patient requires contact precautions if  hospitalized. CRITICAL RESULT CALLED TO, READ BACK BY AND VERIFIED WITH: Jene Every PHARMD 1505 07/22/19 A BROWNING    Methicillin resistance DETECTED (A) NOT DETECTED Final    Comment: CRITICAL RESULT CALLED TO, READ BACK BY AND VERIFIED WITH: Jene Every PHARMD 1505 07/22/19 A BROWNING    Streptococcus species NOT DETECTED NOT DETECTED Final    Streptococcus agalactiae NOT DETECTED NOT DETECTED Final   Streptococcus pneumoniae NOT DETECTED NOT DETECTED Final   Streptococcus pyogenes NOT DETECTED NOT DETECTED Final   Acinetobacter baumannii NOT DETECTED NOT DETECTED Final   Enterobacteriaceae species NOT DETECTED NOT DETECTED Final   Enterobacter cloacae complex NOT DETECTED NOT DETECTED Final   Escherichia coli NOT DETECTED NOT DETECTED Final   Klebsiella oxytoca NOT DETECTED NOT DETECTED Final   Klebsiella pneumoniae NOT DETECTED NOT DETECTED Final   Proteus species NOT DETECTED NOT DETECTED Final   Serratia marcescens NOT DETECTED NOT DETECTED Final   Haemophilus influenzae NOT DETECTED NOT DETECTED Final   Neisseria meningitidis NOT DETECTED NOT DETECTED Final   Pseudomonas aeruginosa NOT DETECTED NOT DETECTED Final   Candida albicans NOT DETECTED NOT DETECTED Final   Candida glabrata NOT DETECTED NOT DETECTED Final   Candida krusei NOT DETECTED NOT DETECTED Final   Candida parapsilosis NOT DETECTED NOT DETECTED Final   Candida tropicalis NOT DETECTED NOT DETECTED Final    Comment: Performed at [facility], [place]. [address], [place], [place] [address]  Culture, blood (Routine x 2)     Status: Abnormal   Collection Time: 07/21/19 10:05 PM   Specimen: BLOOD RIGHT FOREARM  Result Value Ref Range Status   Specimen Description BLOOD RIGHT FOREARM  Final   Special Requests   Final    BOTTLES DRAWN AEROBIC ONLY Blood Culture adequate volume   Culture  Setup Time   Final    GRAM POSITIVE COCCI IN CLUSTERS AEROBIC BOTTLE ONLY CRITICAL VALUE NOTED.  VALUE IS CONSISTENT WITH PREVIOUSLY REPORTED AND CALLED VALUE. Performed at [facility], [place] [address] [place], [place] [address]    Culture METHICILLIN RESISTANT STAPHYLOCOCCUS AUREUS (A)  Final   Report Status 07/24/2019 FINAL  Final   Organism ID, Bacteria METHICILLIN RESISTANT STAPHYLOCOCCUS AUREUS  Final      Susceptibility   Methicillin resistant staphylococcus  aureus - MIC*    CIPROFLOXACIN >=8 RESISTANT Resistant     ERYTHROMYCIN >=8 RESISTANT Resistant  GENTAMICIN <=0.5 SENSITIVE Sensitive     OXACILLIN >=4 RESISTANT Resistant     TETRACYCLINE <=1 SENSITIVE Sensitive     VANCOMYCIN <=0.5 SENSITIVE Sensitive     TRIMETH/SULFA <=10 SENSITIVE Sensitive     CLINDAMYCIN <=0.25 SENSITIVE Sensitive     RIFAMPIN <=0.5 SENSITIVE Sensitive     Inducible Clindamycin NEGATIVE Sensitive     * METHICILLIN RESISTANT STAPHYLOCOCCUS AUREUS  Urine culture     Status: Abnormal   Collection Time: 07/21/19 10:07 PM   Specimen: Urine, Catheterized  Result Value Ref Range Status   Specimen Description URINE, CATHETERIZED  Final   Special Requests   Final    NONE Performed at [facility], [place] [address] [place], [place] [address]    Culture 80,000 COLONIES/mL KLEBSIELLA PNEUMONIAE (A)  Final   Report Status 07/23/2019 FINAL  Final   Organism ID, Bacteria KLEBSIELLA PNEUMONIAE (A)  Final      Susceptibility   Klebsiella pneumoniae - MIC*    AMPICILLIN >=32 RESISTANT Resistant     CEFAZOLIN <=4 SENSITIVE Sensitive     CEFTRIAXONE <=0.25 SENSITIVE Sensitive     CIPROFLOXACIN <=0.25 SENSITIVE Sensitive     GENTAMICIN <=1 SENSITIVE Sensitive     IMIPENEM <=0.25 SENSITIVE Sensitive     NITROFURANTOIN 32 SENSITIVE Sensitive     TRIMETH/SULFA <=20 SENSITIVE Sensitive     AMPICILLIN/SULBACTAM 4 SENSITIVE Sensitive     PIP/TAZO <=4 SENSITIVE Sensitive     * 80,000 COLONIES/mL KLEBSIELLA PNEUMONIAE  SARS Coronavirus 2 by RT PCR (hospital order, performed in [facility] hospital lab) Nasopharyngeal Nasopharyngeal Swab     Status: None   Collection Time: 07/21/19 10:17 PM   Specimen: Nasopharyngeal Swab  Result Value Ref Range Status   SARS Coronavirus 2 NEGATIVE NEGATIVE Final    Comment: (NOTE) SARS-CoV-2 target nucleic acids are NOT DETECTED.  The SARS-CoV-2 RNA is generally detectable in upper and lower respiratory specimens during the acute  phase of infection. The lowest concentration of SARS-CoV-2 viral copies this assay can detect is 250 copies / mL. A negative result does not preclude SARS-CoV-2 infection and should not be used as the sole basis for treatment or other patient management decisions.  A negative result may occur with improper specimen collection / handling, submission of specimen other than nasopharyngeal swab, presence of viral mutation(s) within the areas targeted by this assay, and inadequate number of viral copies (<250 copies / mL). A negative result must be combined with clinical observations, patient history, and epidemiological information.  Fact Sheet for Patients:   StrictlyIdeas.no  Fact Sheet for Healthcare Providers: BankingDealers.co.za  This test is not yet approved or  cleared by the [place] FDA and has been authorized for detection and/or diagnosis of SARS-CoV-2 by FDA under an Emergency Use Authorization (EUA).  This EUA will remain in effect (meaning this test can be used) for the duration of the COVID-19 declaration under Section 564(b)(1) of the Act, 21 U.S.C. section 360bbb-3(b)(1), unless the authorization is terminated or revoked sooner.  Performed at [facility], [place] [address], [place], [place] [address]   Respiratory Panel by PCR     Status: None   Collection Time: 07/22/19 12:58 AM   Specimen: Nasopharyngeal Swab; Respiratory  Result Value Ref Range Status   Adenovirus NOT DETECTED NOT DETECTED Final   Coronavirus 229E NOT DETECTED NOT DETECTED Final    Comment: (NOTE) The Coronavirus on the Respiratory Panel, DOES NOT test for the novel  Coronavirus (  2019 nCoV)    Coronavirus HKU1 NOT DETECTED NOT DETECTED Final   Coronavirus NL63 NOT DETECTED NOT DETECTED Final   Coronavirus OC43 NOT DETECTED NOT DETECTED Final   Metapneumovirus NOT DETECTED NOT DETECTED Final   Rhinovirus / Enterovirus NOT DETECTED NOT  DETECTED Final   Influenza A NOT DETECTED NOT DETECTED Final   Influenza B NOT DETECTED NOT DETECTED Final   Parainfluenza Virus 1 NOT DETECTED NOT DETECTED Final   Parainfluenza Virus 2 NOT DETECTED NOT DETECTED Final   Parainfluenza Virus 3 NOT DETECTED NOT DETECTED Final   Parainfluenza Virus 4 NOT DETECTED NOT DETECTED Final   Respiratory Syncytial Virus NOT DETECTED NOT DETECTED Final   Bordetella pertussis NOT DETECTED NOT DETECTED Final   Chlamydophila pneumoniae NOT DETECTED NOT DETECTED Final   Mycoplasma pneumoniae NOT DETECTED NOT DETECTED Final    Comment: Performed at [facility], [place] [address], [place], [place] [address]  Culture, blood (routine x 2)     Status: Abnormal   Collection Time: 07/22/19  8:11 PM   Specimen: BLOOD  Result Value Ref Range Status   Specimen Description BLOOD RIGHT ANTECUBITAL  Final   Special Requests   Final    BOTTLES DRAWN AEROBIC ONLY Blood Culture results may not be optimal due to an inadequate volume of blood received in culture bottles   Culture  Setup Time   Final    GRAM POSITIVE COCCI IN CLUSTERS AEROBIC BOTTLE ONLY CRITICAL RESULT CALLED TO, READ BACK BY AND VERIFIED WITH: J. FRENS PHrmd, at 1353 07/23/19 by d. vanhook    Culture (A)  Final    STAPHYLOCOCCUS AUREUS SUSCEPTIBILITIES PERFORMED ON PREVIOUS CULTURE WITHIN THE LAST 5 DAYS. Performed at [facility], [place] [address] [place], [place] [address]    Report Status 07/25/2019 FINAL  Final  Culture, blood (routine x 2)     Status: Abnormal   Collection Time: 07/22/19  8:26 PM   Specimen: BLOOD LEFT HAND  Result Value Ref Range Status   Specimen Description BLOOD LEFT HAND  Final   Special Requests   Final    BOTTLES DRAWN AEROBIC ONLY Blood Culture results may not be optimal due to an inadequate volume of blood received in culture bottles   Culture  Setup Time   Final    GRAM POSITIVE COCCI IN CLUSTERS AEROBIC BOTTLE ONLY CRITICAL VALUE NOTED.  VALUE IS  CONSISTENT WITH PREVIOUSLY REPORTED AND CALLED VALUE.    Culture (A)  Final    STAPHYLOCOCCUS AUREUS SUSCEPTIBILITIES PERFORMED ON PREVIOUS CULTURE WITHIN THE LAST 5 DAYS. Performed at [facility], [place] [address], [place], [place] [address]    Report Status 07/25/2019 FINAL  Final  Culture, blood (routine x 2)     Status: Abnormal   Collection Time: 07/24/19 10:33 AM   Specimen: BLOOD LEFT ARM  Result Value Ref Range Status   Specimen Description BLOOD LEFT ARM  Final   Special Requests   Final    BOTTLES DRAWN AEROBIC ONLY Blood Culture results may not be optimal due to an inadequate volume of blood received in culture bottles   Culture  Setup Time   Final    AEROBIC BOTTLE ONLY GRAM POSITIVE COCCI IN CLUSTERS CRITICAL VALUE NOTED.  VALUE IS CONSISTENT WITH PREVIOUSLY REPORTED AND CALLED VALUE.    Culture (A)  Final    STAPHYLOCOCCUS AUREUS SUSCEPTIBILITIES PERFORMED ON PREVIOUS CULTURE WITHIN THE LAST 5 DAYS. Performed at [facility], [address] [place], [place] [address]    Report Status 07/26/2019 FINAL  Final  Culture, blood (routine x 2)     Status: Abnormal   Collection Time: 07/24/19 10:33 AM   Specimen: BLOOD LEFT HAND  Result Value Ref Range Status   Specimen Description BLOOD LEFT HAND  Final   Special Requests   Final    BOTTLES DRAWN AEROBIC ONLY Blood Culture results may not be optimal due to an inadequate volume of blood received in culture bottles   Culture  Setup Time   Final    AEROBIC BOTTLE ONLY GRAM POSITIVE COCCI IN CLUSTERS CRITICAL VALUE NOTED.  VALUE IS CONSISTENT WITH PREVIOUSLY REPORTED AND CALLED VALUE.    Culture (A)  Final    STAPHYLOCOCCUS AUREUS SUSCEPTIBILITIES PERFORMED ON PREVIOUS CULTURE WITHIN THE LAST 5 DAYS. Performed at [facility], [place] [address], [place], [place] [address]    Report Status 07/26/2019 FINAL  Final  Culture, blood (routine x 2)     Status: None   Collection Time: 07/25/19  1:25 PM    Specimen: BLOOD  Result Value Ref Range Status   Specimen Description BLOOD LEFT ANTECUBITAL  Final   Special Requests   Final    BOTTLES DRAWN AEROBIC AND ANAEROBIC Blood Culture adequate volume   Culture   Final    NO GROWTH 5 DAYS Performed at [facility], [place] [address] [place], [place] [address]    Report Status 07/30/2019 FINAL  Final  Culture, blood (routine x 2)     Status: None   Collection Time: 07/25/19  1:30 PM   Specimen: BLOOD LEFT HAND  Result Value Ref Range Status   Specimen Description BLOOD LEFT HAND  Final   Special Requests   Final    BOTTLES DRAWN AEROBIC AND ANAEROBIC Blood Culture adequate volume   Culture   Final    NO GROWTH 5 DAYS Performed at [facility], [place] [address], [place], [place] [address]    Report Status 07/30/2019 FINAL  Final  Culture, Urine     Status: None   Collection Time: 07/28/19 12:36 PM   Specimen: Urine, Random  Result Value Ref Range Status   Specimen Description URINE, RANDOM  Final   Special Requests NONE  Final   Culture   Final    NO GROWTH Performed at [facility], [place] [address], [place], [place] [address]    Report Status 07/29/2019 FINAL  Final    Michel Bickers, MD [facility] [place] [phone] pager   [phone] cell 07/30/2019, 3:42 PM

## 2019-07-30 NOTE — Progress Notes (Signed)
Patient brought down to Endo for TEE for bacteremia.  Procedure explained and patient refuses to proceed and wants to go back to her room.  Spoke with Dr. [place] Pink.  Will cancel procedure and he will discuss further with the patient.

## 2019-07-30 NOTE — Interval H&P Note (Signed)
History and Physical Interval Note:  07/30/2019 8:47 AM  Heather Petty  has presented today for surgery, with the diagnosis of BACTEREMIA.  The various methods of treatment have been discussed with the patient and family. After consideration of risks, benefits and other options for treatment, the patient has consented to  Procedure(s): TRANSESOPHAGEAL ECHOCARDIOGRAM (TEE) (N/A) as a surgical intervention.  The patient's history has been reviewed, patient examined, no change in status, stable for surgery.  I have reviewed the patient's chart and labs.  Questions were answered to the patient's satisfaction.     Fransico Him

## 2019-07-30 NOTE — Progress Notes (Signed)
Patient refusing to have TEE on 07/30/19.  Dr. Radford Pax at bedside.  Procedure canceled.  Hospitalist MD notified and will speak to patient when she returns to the unit.

## 2019-07-31 DIAGNOSIS — E1111 Type 2 diabetes mellitus with ketoacidosis with coma: Secondary | ICD-10-CM

## 2019-07-31 LAB — BASIC METABOLIC PANEL
Anion gap: 7 (ref 5–15)
BUN: 9 mg/dL (ref 8–23)
CO2: 27 mmol/L (ref 22–32)
Calcium: 7.7 mg/dL — ABNORMAL LOW (ref 8.9–10.3)
Chloride: 101 mmol/L (ref 98–111)
Creatinine, Ser: 0.78 mg/dL (ref 0.44–1.00)
GFR calc Af Amer: >60 mL/min (ref >60)
GFR calc non Af Amer: >60 mL/min (ref >60)
Glucose, Bld: 163 mg/dL — ABNORMAL HIGH (ref 70–99)
Potassium: 2.9 mmol/L — ABNORMAL LOW (ref 3.5–5.1)
Sodium: 135 mmol/L (ref 135–145)

## 2019-07-31 LAB — GLUCOSE, CAPILLARY
Glucose-Capillary: 149 mg/dL — ABNORMAL HIGH (ref 70–99)
Glucose-Capillary: 176 mg/dL — ABNORMAL HIGH (ref 70–99)
Glucose-Capillary: 258 mg/dL — ABNORMAL HIGH (ref 70–99)
Glucose-Capillary: 168 mg/dL — ABNORMAL HIGH (ref 70–99)

## 2019-07-31 LAB — MAGNESIUM: Magnesium: 1.8 mg/dL (ref 1.7–2.4)

## 2019-07-31 LAB — SARS CORONAVIRUS 2 (TAT 6-24 HRS): SARS Coronavirus 2: NEGATIVE

## 2019-07-31 MED ORDER — POLYETHYLENE GLYCOL 3350 17 G PO PACK: 17.0000 g | PACK | Freq: Every day | ORAL | Status: DC | PRN

## 2019-07-31 MED ORDER — VANCOMYCIN IV (FOR PTA / DISCHARGE USE ONLY)
1000.0000 mg | Freq: Two times a day (BID) | INTRAVENOUS | 0 refills | Status: DC
Start: 2019-07-31 — End: 2019-08-13

## 2019-07-31 MED ORDER — INSULIN GLARGINE 100 UNIT/ML ~~LOC~~ SOLN: 25.0000 [IU] | Freq: Every day | SUBCUTANEOUS | Status: DC

## 2019-07-31 MED ORDER — POTASSIUM CHLORIDE 20 MEQ PO PACK
40.0000 meq | PACK | ORAL | Status: AC
  Administered 2019-07-31 (×2): 40 meq via ORAL
  Filled 2019-07-31 (×2): qty 2

## 2019-07-31 MED ORDER — INSULIN ASPART 100 UNIT/ML ~~LOC~~ SOLN: 0.0000 [IU] | SUBCUTANEOUS | Status: AC

## 2019-07-31 NOTE — Progress Notes (Signed)
Potassium 2.9 this AM. Dr. Cyd Silence made aware via secure text.

## 2019-07-31 NOTE — Progress Notes (Signed)
Occupational Therapy Treatment Patient Details Name: Heather Petty MRN: [id number] DOB: 11-29-1950 Today's Date: 07/31/2019    History of present illness Pt is a 69 y.o. F with hx HTN, IDDM, obesity, hx CVA, schizophrenia/depression and asthma who presented with acute AMS. Reportedly found by her daughter slumped over in her bathtub, family had last spoken to her 2 days ago.  Pt admitted with sepsis and mild rhabdo.   OT comments  Pt required max encouragement to participate in therapy session, reporting "I just want to sleep". Once agreeable to participate, pt able to demonstrate sit to stand, stand pivot, and mobility to/from sink with RW at Supervision level with only minor cues needed for O2 cord mgmt. Max encouragement required for pt to stand at sink for grooming tasks (< 1 minute) and completed the remainder of grooming tasks seated at sink with setup/supervision. Vitals WFL on 2 L O2 after activity. SNF for short term rehab remains appropriate. Will continue to follow acutely.    Follow Up Recommendations  SNF;Supervision/Assistance - 24 hour    Equipment Recommendations  3 in 1 bedside commode    Recommendations for Other Services      Precautions / Restrictions Precautions Precautions: Fall Restrictions Weight Bearing Restrictions: No       Mobility Bed Mobility   Bed Mobility: Supine to Sit     Supine to sit: Min assist     General bed mobility comments: up in recliner on entry  Transfers Overall transfer level: Needs assistance Equipment used: Rolling walker (2 wheeled) Transfers: Sit to/from [facility] Sit to Stand: Supervision Stand pivot transfers: Supervision       General transfer comment: Supervision for sit to stand and stand pivot back to recliner. cues needed for sequencing to avoid being tangled in O2 line    Balance Overall balance assessment: Needs assistance Sitting-balance support: Feet supported;No upper extremity  supported Sitting balance-Leahy Scale: Good Sitting balance - Comments: Sitting balance at EOB for 10 mins with weight shifting and pertubations from washing back, pt able to wash front.   Standing balance support: During functional activity;Single extremity supported Standing balance-Leahy Scale: Fair Standing balance comment: single UE support on sink during grooming task without physical assistance                           ADL either performed or assessed with clinical judgement   ADL Overall ADL's : Needs assistance/impaired     Grooming: Set up;Supervision/safety;Sitting;Standing;Brushing hair;Oral care Grooming Details (indicate cue type and reason): Max encouragement needed to stand for grooming tasks with pt only standing <1 minute and decided to complete remainder of grooming tasks seated at sink             Lower Body Dressing: Maximal assistance Lower Body Dressing Details (indicate cue type and reason): Max A to don socks. Pt initially reported being able to do this at home, then when OT requested pt to don socks prior to mobility, reports unable to complete and would not attempt task             Functional mobility during ADLs: Set up;Rolling walker General ADL Comments: Overall Supervision for mobility to/from sink with RW, asssistance needed to manage O2 cord. Pt limited by decreased endurance and initiation of tasks without max encouragement.      Vision   Vision Assessment?: No apparent visual deficits   Perception     Praxis  Cognition Arousal/Alertness: Awake/alert Behavior During Therapy: Flat affect Overall Cognitive Status: No family/caregiver present to determine baseline cognitive functioning Area of Impairment: Problem solving;Safety/judgement                 Orientation Level: Situation Current Attention Level: Selective     Safety/Judgement: Decreased awareness of safety;Decreased awareness of deficits   Problem  Solving: Decreased initiation General Comments: Pt requires max encouragement to pariticpate and intiate tasks, reporting desire to just go home, but reports " I cant do these things you want me to do", then demonstrates ability to do those things        Exercises     Shoulder Instructions       General Comments After activity on 2 L O2, O2 stats WFL and HR in 80s    Pertinent Vitals/ Pain       Pain Assessment: Faces Faces Pain Scale: No hurt  Home Living                                          Prior Functioning/Environment              Frequency  Min 2X/week        Progress Toward Goals  OT Goals(current goals can now be found in the care plan section)  Progress towards OT goals: Progressing toward goals  Acute Rehab OT Goals Patient Stated Goal: go home OT Goal Formulation: With patient Time For Goal Achievement: 08/07/19 Potential to Achieve Goals: Good ADL Goals Pt Will Perform Grooming: with modified independence;standing Pt Will Perform Lower Body Dressing: with modified independence;with adaptive equipment;sit to/from stand Pt Will Transfer to Toilet: with modified independence;ambulating Additional ADL Goal #1: Pt will demonstrate independence with medication management. Additional ADL Goal #2: Pt will complete multistep cognition task for safe engagement in ADL/IADL and functional mobility.  Plan Discharge plan remains appropriate    Co-evaluation                 AM-PAC OT "6 Clicks" Daily Activity     Outcome Measure   Help from another person eating meals?: None Help from another person taking care of personal grooming?: A Little Help from another person toileting, which includes using toliet, bedpan, or urinal?: A Little Help from another person bathing (including washing, rinsing, drying)?: A Lot Help from another person to put on and taking off regular upper body clothing?: A Little Help from another person to put  on and taking off regular lower body clothing?: A Lot 6 Click Score: 17    End of Session Equipment Utilized During Treatment: Gait belt;Rolling walker;Oxygen  OT Visit Diagnosis: Unsteadiness on feet (R26.81);Other abnormalities of gait and mobility (R26.89);Muscle weakness (generalized) (M62.81);Other symptoms and signs involving cognitive function   Activity Tolerance Patient tolerated treatment well   Patient Left in chair;with chair alarm set;with call bell/phone within reach   Nurse Communication Mobility status        Time: 9371-6967 OT Time Calculation (min): 35 min  Charges: OT General Charges $OT Visit: 1 Visit OT Treatments $Self Care/Home Management : 23-37 mins  Layla Maw, OTR/L   Layla Maw 07/31/2019, 1:23 PM

## 2019-07-31 NOTE — Discharge Summary (Addendum)
Discharge Summary  Heather Petty JYN:[id number] DOB: 08-12-1950  PCP: [name], MD  Admit date: 07/21/2019 Discharge date: 07/31/2019  Time spent: 35 minutes   Recommendations for Outpatient Follow-up:  1. Patient being discharged to skilled nursing facility. 2. She will continue on IV vancomycin, dosage adjusted based on levels, for 32 more days for total of 6 weeks of therapy, to be stopped on 7/26 3. She will follow-up in the infectious disease clinic on 7/26 with Dr. 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    Discharge Condition: Improved, being discharged to skilled nursing  Diet recommendation: Carb modified, low-sodium  Vitals:   07/31/19 0734 07/31/19 1215  BP: (!) 143/61 106/67  Pulse: 64 89  Resp: (!) 28 (!) 22  Temp: 98.3 F (36.8 C) 98.7 F (37.1 C)  SpO2: 100% 98%    History of present illness:  History of morbid obesity hypertension diabetes mellitus plus schizophrenia admitted on 6/14 for confusion after being found by her daughter slumped in the bathtub.  EMS noted CBG at 550 and temp of 103.  Patient found to be in DKA plus sepsis.  Hospital Course:  Sepsis: Patient met criteria for sepsis on admission given leukocytosis lactic acidosis, tachycardia, acute kidney injury and acute  metabolic encephalopathy.  qSOFA score greater than 2.  Now resolved.  Resolved. Due to MRSA and enterococcal bacteremia.  Source is felt to be secondary to infected sacral ulcer.UA is negative for evidence of UTI.  With IV fluids and insulin plus antibiotics, DKA resolved and sepsis stabilized.  Blood cultures grew out MRSA +1 g Enterococcus and MRSA.  Urine culture growing out Klebsiella.  Infectious is consulted.  Echocardiogram unrevealing other than grade 1 diastolic dysfunction noted.  TEE was recommended.  Surveillance cultures on 6/15, 16 and 17 all grew out gram-positive cocci with clusters, but cultures since then have been negative.  Patient was supposed to get TEE on 6/23, but after they took her down, she refused.  Stating that she is just tired.  Infectious disease followed up and then recommended just patient needed 6 weeks of IV vancomycin.  PICC line placed on 6/22.  Patient will continue on vancomycin at skilled nursing facility until 7/26.  MRSA and Enterococcal Bacteremia: Likely as a result of UTI and sacral decubitus ulcer. The patient is currently on vancomycin. I appreciate infectious disease's assistance. Echocardiogram has demonstrated an EF of 65-70%. LV normal function with mild concentric left ventricular hypertrophy. There is evidence of Grade I diastolic dysfunction. RV systolic function and size are normal. Indeterminate number of Aortic valve cusps are present. No aortic stenosis. There is no evidence of valvular vegetations. TEE has been recommended by ID and has been ordered. Surveillance cultures drawn on 07/22/2019 and 07/23/2019 have again grown out GPC.  But after this, they stopped growth.  The patient is receiving Vancomycin and Zyvox.  PICC line replaced on 6/22.  Will need vancomycin until 7/26 to complete total of 6 weeks of therapy.  Dysuria: UA is negative. Urine culturehas had no growth.  DKA: Resolved. Multifactorial. Acute infection/bacteremia in setting of  dehydration and lack of access to insulin for 1-2 days while she was down. The patient is currently receiving Lantus 25 units daily plus sliding scale.  We will continue this after discharge.  Rhabdomyolysis: Resolving. Mild, likely too low to be cause of AKI, which is more likely from prerenal injury in sepsis. Monitor and continue IV fluids until CK is normalized.  Hypokalemia/Hypophosphatemia/Hypomagnesemia: Supplement and monitor.  AKI: Resolved. Creatinine was 1.42 on admission. Creatinine 0.77this morning after IV fluids. Continue to monitor creatinine, electrolytes, and volume status.   Acute metabolic encephalopathy: Resolved.  Have restarted her Risperdal upon discharge.  Stage II sacral pressure ulcer (POA): Wound care consulted and will continue their recommendations at skilled nursing. Likely source of MRSA bacteremia.   Hypertension: The patient's blood pressures have been trending upward.    Better controlled.  Medications continued upon dischargeAsthma: Noted. Stable and quiescent.  Morbid obesity: Meets criteria for BMI greater than 40  Schizoaffective disorder: Hold Risperdal for now  Consultants:  Infectious disease  Cardiology  Procedures:  Echocardiogram done diastolic dysfunction no evidence of vegetations.  PICC line placement 6/22  Discharge Exam: BP 106/67    Pulse 89    Temp 98.7 F (37.1 C) (Oral)    Resp (!) 22    Ht 5' 1" (1.549 m)    Wt 110 kg    SpO2 98%    BMI 45.82 kg/m   General: Alert and oriented x2, no acute distress Cardiovascular: Regular rate and rhythm, S1-S2 Respiratory: Clear to auscultation bilaterally  Discharge Instructions You were cared for by a hospitalist during your hospital stay. If you have any questions about your discharge medications or the care you received while you were in the hospital after you are discharged, you can call the unit and asked to speak with the hospitalist on call if the hospitalist that took  care of you is not available. Once you are discharged, your primary care physician will handle any further medical issues. Please note that NO REFILLS for any discharge medications will be authorized once you are discharged, as it is imperative that you return to your primary care physician (or establish a relationship with a primary care physician if you do not have one) for your aftercare needs so that they can reassess your need for medications and monitor your lab values.  Discharge Instructions    [facility] pharmacist to adjust dose for Vancomycin, Aminoglycosides and other anti-infective therapies as requested by physician.   Complete by: As directed    [facility] to provide Cath Flo 24m   Complete by: As directed    Administer for PICC line occlusion and as ordered by physician for other access device issues.   Anaphylaxis Kit: Provided to treat any anaphylactic reaction to the medication being provided to the patient if First Dose or when requested by physician   Complete by: As directed    Epinephrine 118mml vial / amp: Administer 0.46m15m0.46ml20mubcutaneously once for moderate to severe anaphylaxis, nurse to call physician and pharmacy when reaction occurs and call 911 if needed for immediate care   Diphenhydramine 50mg58mIV vial: Administer 25-50mg 84mM PRN for first dose reaction, rash, itching, mild reaction, nurse to call physician and pharmacy when reaction occurs  Sodium Chloride 0.9% NS 561m IV: Administer if needed for hypovolemic blood pressure drop or as ordered by physician after call to physician with anaphylactic reaction   Change dressing on IV access line weekly and PRN   Complete by: As directed    Diet - low sodium heart healthy   Complete by: As directed    Discharge wound care:   Complete by: As directed    Foam dressing until discontinued.  Applied dressing to buttock, sacrum and change every 3 days or as needed soiling.  If patient is  frequently incontinent, then leave foam dressing off and apply barrier cream with each turning and cleaning episode.   Flush IV access with Sodium Chloride 0.9% and Heparin 10 units/ml or 100 units/ml   Complete by: As directed    Home infusion instructions - [facility]   Complete by: As directed    Instructions: Flush IV access with Sodium Chloride 0.9% and Heparin 10units/ml or 100units/ml   Change dressing on IV access line: Weekly and PRN   Instructions Cath Flo 27m Administer for PICC Line occlusion and as ordered by physician for other access device   [facility] pharmacist to adjust dose for: Vancomycin, Aminoglycosides and other anti-infective therapies as requested by physician   Increase activity slowly   Complete by: As directed    Method of administration may be changed at the discretion of home infusion pharmacist based upon assessment of the patient and/or caregivers ability to self-administer the medication ordered   Complete by: As directed      Allergies as of 07/31/2019      Reactions   Lasix [furosemide] Other (See Comments)   IV-LASIX ==> Reaction: Burning      Medication List    STOP taking these medications   antiseptic oral rinse Liqd   cetirizine 10 MG tablet Commonly known as: ZYRTEC   cyanocobalamin 1000 MCG/ML injection Commonly known as: (VITAMIN B-12)   furosemide 40 MG tablet Commonly known as: LASIX   gabapentin 600 MG tablet Commonly known as: NEURONTIN   hydrochlorothiazide 12.5 MG tablet Commonly known as: HYDRODIURIL   meloxicam 7.5 MG tablet Commonly known as: MOBIC   potassium chloride 10 MEQ tablet Commonly known as: KLOR-CON   silver sulfADIAZINE 1 % cream Commonly known as: SILVADENE   traMADol 50 MG tablet Commonly known as: ULTRAM   triamcinolone 0.147 MG/GM topical spray Commonly known as: KENALOG     TAKE these medications   acetaminophen 325 MG tablet Commonly known as: TYLENOL Take 650 mg  by mouth 4 (four) times daily as needed for headache (pain).   allopurinol 300 MG tablet Commonly known as: ZYLOPRIM Take 300 mg by mouth daily.   amLODipine 10 MG tablet Commonly known as: NORVASC Take 10 mg by mouth daily.   b complex vitamins tablet Take 1 tablet by mouth daily.   benazepril 20 MG tablet Commonly known as: LOTENSIN Take 20 mg by mouth daily.   bisacodyl 5 MG EC tablet Commonly known as: DULCOLAX Take 1 tablet (5 mg total) by mouth daily as needed for moderate constipation.   colchicine 0.6 MG tablet Take 1 tablet (0.6 mg total) by mouth 2 (two) times daily.   collagenase ointment Commonly known as: SANTYL Apply topically daily. What changed:   how much to take  when to take this  additional instructions   diphenhydrAMINE 2 % cream Commonly known as: BENADRYL Apply 1 application topically 3 (three) times daily as needed  for itching.   doxepin 25 MG capsule Commonly known as: SINEQUAN Take 25 mg by mouth at bedtime.   Dymista 137-50 MCG/ACT Susp Generic drug: Azelastine-Fluticasone Place 1 spray into both nostrils 2 (two) times daily as needed (for allergies).   Eucerin Eczema Relief 1 % Crea Generic drug: Colloidal Oatmeal Apply 1 application topically 2 (two) times daily.   famotidine 20 MG tablet Commonly known as: PEPCID Take 20 mg by mouth 2 (two) times daily.   folic acid 1 MG tablet Commonly known as: FOLVITE Take 1 mg by mouth daily.   glipiZIDE 10 MG 24 hr tablet Commonly known as: GLUCOTROL XL Take 10 mg by mouth 2 (two) times daily.   hydrocortisone 2.5 % cream Apply 1 application topically See admin instructions. Apply topically to rash on face twice daily   hydrOXYzine 25 MG tablet Commonly known as: ATARAX/VISTARIL Take 25 mg by mouth 2 (two) times daily as needed for itching.   insulin aspart 100 UNIT/ML injection Commonly known as: NovoLOG Inject 0-12 Units into the skin See admin instructions. Inject 0-12 units  into the skin 4 times a day (before meals and at bedtime) per sliding scale: BGL <60 or >400  = CALL MD; 60-200 = 0 units; 201-250 = 2 units; 251-300 = 4 units; 301-350 = 6 units; 351-400 = 8 units; 401-450 = 10 units; >450 = 12 units   insulin glargine 100 UNIT/ML injection Commonly known as: LANTUS Inject 0.25 mLs (25 Units total) into the skin at bedtime.   ipratropium-albuterol 0.5-2.5 (3) MG/3ML Soln Commonly known as: DUONEB Take 3 mLs by nebulization every 6 (six) hours as needed (for shortness of breath or wheezing).   nadolol 20 MG tablet Commonly known as: CORGARD Take 1 tablet (20 mg total) by mouth daily.   nystatin powder Commonly known as: MYCOSTATIN/NYSTOP Apply 1 g topically See admin instructions. Apply topically twice daily as needed for wound care  -  between folds, beneath breasts, bilateral axillae, and under pannus   polyethylene glycol 17 g packet Commonly known as: MIRALAX / GLYCOLAX Take 17 g by mouth daily as needed for mild constipation.   pravastatin 40 MG tablet Commonly known as: PRAVACHOL Take 40 mg by mouth at bedtime.   risperiDONE 0.5 MG tablet Commonly known as: RISPERDAL Take 1 tablet (0.5 mg total) by mouth 2 (two) times daily.   THERA-M MULTIPLE VITAMINS PO Take 1 tablet by mouth daily.   vancomycin  IVPB Inject 1,000 mg into the vein every 12 (twelve) hours. Indication: MRSA and Enterococcus Bacteremia First Dose: Yes Last Day of Therapy: 09/01/2019 Labs - Sunday/Monday:  CBC/D, BMP, and vancomycin trough. Labs - Thursday:  BMP and vancomycin trough Labs - Every other week:  ESR and CRP Method of administration:Elastomeric Method of administration may be changed at the discretion of the patient and/or caregiver's ability to self-administer the medication ordered.   Ventolin HFA 108 (90 Base) MCG/ACT inhaler Generic drug: albuterol Inhale 2 puffs into the lungs every 4 (four) hours as needed for wheezing or shortness of breath.     vitamin C 500 MG tablet Commonly known as: ASCORBIC ACID Take 500 mg by mouth 2 (two) times daily.   Vitamin D (Ergocalciferol) 1.25 MG (50000 UNIT) Caps capsule Commonly known as: DRISDOL Take 50,000 Units by mouth every Monday.   ZINC-220 PO Take 220 mg by mouth at bedtime.            Discharge Care Instructions  (From admission, onward)  Start     Ordered   07/31/19 0000  Change dressing on IV access line weekly and PRN  (Home infusion instructions - [facility] )        07/31/19 1307   07/31/19 0000  Discharge wound care:       Comments: Foam dressing until discontinued.  Applied dressing to buttock, sacrum and change every 3 days or as needed soiling.  If patient is frequently incontinent, then leave foam dressing off and apply barrier cream with each turning and cleaning episode.   07/31/19 1307         Allergies  Allergen Reactions   Lasix [Furosemide] Other (See Comments)    IV-LASIX ==> Reaction: Burning      The results of significant diagnostics from this hospitalization (including imaging, microbiology, ancillary and laboratory) are listed below for reference.    Significant Diagnostic Studies: CT ABDOMEN PELVIS WO CONTRAST  Addendum Date: 07/22/2019   ADDENDUM REPORT: 07/22/2019 00:43 ADDENDUM: Along the posterior left subcutaneous soft tissues overlying the iliac there is a focal area of ulceration and fat stranding changes. No loculated fluid collections or subcutaneous emphysema. No cortical destruction or bony erosion however seen at the posterior iliac. Electronically Signed   By: Prudencio Pair M.D.   On: 07/22/2019 00:43   Result Date: 07/22/2019 CLINICAL DATA:  Fever of unknown origin EXAM: CT CHEST, abdomen, and pelvis WITHOUT CONTRAST TECHNIQUE: Multidetector CT imaging of the chest, abdomen and pelvis was performed following the standard protocol without IV contrast. COMPARISON:  July 19, 2018 FINDINGS: Cardiovascular: Scattered  aortic atherosclerosis is noted. Coronary artery calcifications are seen. The heart size is normal. There is no pericardial thickening or effusion. Mediastinum/Nodes: There are no enlarged mediastinal, hilar or axillary lymph nodes. The thyroid gland, trachea and esophagus demonstrate no significant findings. Lungs/Pleura: Minimal streaky atelectasis seen at both lung bases. Small amount of tree-in-bud opacity seen at the posterior periphery of the right lung base as on the prior exam. There is a small 5 mm pulmonary nodule at the right lung base which was partially visualized on prior exam. No pleural effusion Upper abdomen: The visualized portion of the upper abdomen is unremarkable. Musculoskeletal/Chest wall: There is no chest wall mass or suspicious osseous finding. 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 Abdomen/pelvis: Hepatobiliary: There is diffuse low density seen throughout the liver parenchyma. No focal hepatic lesion although limited due to the lack of intravenous contrast. Layering hyperdense sludge/small stones are noted. Ill intrahepatic biliary ductal dilatation. Pancreas: Unremarkable.  No surrounding inflammatory changes. Spleen: Normal in size. Although limited due to the lack of intravenous contrast, normal in appearance. Adrenals/Urinary Tract: Both adrenal glands appear normal. The kidneys and collecting system appear normal without evidence of urinary tract calculus or hydronephrosis. Bladder is unremarkable. Stomach/Bowel: The stomach, small bowel, and colon are normal in appearance. No inflammatory changes or obstructive findings. Diverticulosis without diverticulitis is seen. The appendix is unremarkable. Vascular/Lymphatic: There are no enlarged abdominal or pelvic lymph nodes. Scattered aortic atherosclerotic calcifications are seen without aneurysmal dilatation. Reproductive: The uterus  and adnexa are unremarkable. Other: No evidence of abdominal wall mass or hernia. Musculoskeletal: No acute or significant osseous findings. IMPRESSION: 1. No definite acute intrathoracic, abdominal, or pelvic pathology to explain the patient's symptoms. 2. Unchanged minimal tree-in-bud opacities at the posterior periphery of the right lung base, likely due to chronic inflammatory process. 3.  Cholelithiasis without  evidence of acute cholecystitis. 4. 5 mm pulmonary nodule at the right lung base. No follow-up recommended. This recommendation follows the consensus statement: Guidelines for Management of Incidental Pulmonary Nodules Detected on CT Images: From the Fleischner Society 2017; Radiology 2017; 284:228-243. 5.  Aortic Atherosclerosis (ICD10-I70.0). Electronically Signed: By: Prudencio Pair M.D. On: 07/22/2019 00:12   DG Chest 2 View  Result Date: 07/21/2019 CLINICAL DATA:  Suspected sepsis. EXAM: CHEST - 2 VIEW COMPARISON:  Radiograph 07/19/2018 FINDINGS: Low lung volumes. Mild cardiomegaly. Aortic atherosclerosis. Peribronchial thickening. There are streaky bibasilar opacities. No confluent airspace disease. No significant pleural effusion. No pneumothorax. No acute osseous abnormalities are seen. IMPRESSION: 1. Low lung volumes with streaky bibasilar opacities, favoring atelectasis. 2. Peribronchial thickening. 3. Mild cardiomegaly. Electronically Signed   By: Keith Rake M.D.   On: 07/21/2019 22:46   DG Shoulder Right  Result Date: 07/24/2019 CLINICAL DATA:  Unwitnessed fall EXAM: RIGHT SHOULDER - 2+ VIEW COMPARISON:  None. FINDINGS: Degenerative changes in the right [facility] and glenohumeral joints with joint space narrowing and spurring. No acute bony abnormality. Specifically, no fracture, subluxation, or dislocation. IMPRESSION: Degenerative changes.  No acute bony abnormality. Electronically Signed   By: Rolm Baptise M.D.   On: 07/24/2019 20:55   CT Head Wo Contrast  Result Date:  07/21/2019 CLINICAL DATA:  Altered mental status. EXAM: CT HEAD WITHOUT CONTRAST TECHNIQUE: Contiguous axial images were obtained from the base of the skull through the vertex without intravenous contrast. COMPARISON:  Head CT 05/07/2018 FINDINGS: Brain: No intracranial hemorrhage, mass effect, or midline shift. Normal brain volume for age. No hydrocephalus. The basilar cisterns are patent. Mild chronic small vessel ischemia. No evidence of territorial infarct or acute ischemia. No extra-axial or intracranial fluid collection. Vascular: Atherosclerosis of skullbase vasculature without hyperdense vessel or abnormal calcification. Skull: No fracture or focal lesion. Sinuses/Orbits: Paranasal sinuses and mastoid air cells are clear. The visualized orbits are unremarkable. Other: None. IMPRESSION: 1. No acute intracranial abnormality. 2. Mild chronic small vessel ischemia. Electronically Signed   By: Keith Rake M.D.   On: 07/21/2019 22:59   CT Chest Wo Contrast  Addendum Date: 07/22/2019   ADDENDUM REPORT: 07/22/2019 00:43 ADDENDUM: Along the posterior left subcutaneous soft tissues overlying the iliac there is a focal area of ulceration and fat stranding changes. No loculated fluid collections or subcutaneous emphysema. No cortical destruction or bony erosion however seen at the posterior iliac. Electronically Signed   By: Prudencio Pair M.D.   On: 07/22/2019 00:43   Result Date: 07/22/2019 CLINICAL DATA:  Fever of unknown origin EXAM: CT CHEST, abdomen, and pelvis WITHOUT CONTRAST TECHNIQUE: Multidetector CT imaging of the chest, abdomen and pelvis was performed following the standard protocol without IV contrast. COMPARISON:  July 19, 2018 FINDINGS: Cardiovascular: Scattered aortic atherosclerosis is noted. Coronary artery calcifications are seen. The heart size is normal. There is no pericardial thickening or effusion. Mediastinum/Nodes: There are no enlarged mediastinal, hilar or axillary lymph nodes. The  thyroid gland, trachea and esophagus demonstrate no significant findings. Lungs/Pleura: Minimal streaky atelectasis seen at both lung bases. Small amount of tree-in-bud opacity seen at the posterior periphery of the right lung base as on the prior exam. There is a small 5 mm pulmonary nodule at the right lung base which was partially visualized on prior exam. No pleural effusion Upper abdomen: The visualized portion of the upper abdomen is unremarkable. Musculoskeletal/Chest wall: There is no chest wall mass or suspicious osseous finding. No acute osseous abnormality advanced  bilateral shoulder osteoarthritis is seen with joint space loss and flattening of the humeral heads. Abdomen/pelvis: Hepatobiliary: There is diffuse low density seen throughout the liver parenchyma. No focal hepatic lesion although limited due to the lack of intravenous contrast. Layering hyperdense sludge/small stones are noted. Ill intrahepatic biliary ductal dilatation. Pancreas: Unremarkable.  No surrounding inflammatory changes. Spleen: Normal in size. Although limited due to the lack of intravenous contrast, normal in appearance. Adrenals/Urinary Tract: Both adrenal glands appear normal. The kidneys and collecting system appear normal without evidence of urinary tract calculus or hydronephrosis. Bladder is unremarkable. Stomach/Bowel: The stomach, small bowel, and colon are normal in appearance. No inflammatory changes or obstructive findings. Diverticulosis without diverticulitis is seen. The appendix is unremarkable. Vascular/Lymphatic: There are no enlarged abdominal or pelvic lymph nodes. Scattered aortic atherosclerotic calcifications are seen without aneurysmal dilatation. Reproductive: The uterus and adnexa are unremarkable. Other: No evidence of abdominal wall mass or hernia. Musculoskeletal: No acute or significant osseous findings. IMPRESSION: 1. No definite acute intrathoracic, abdominal, or pelvic pathology to explain the  patient's symptoms. 2. Unchanged minimal tree-in-bud opacities at the posterior periphery of the right lung base, likely due to chronic inflammatory process. 3.  Cholelithiasis without evidence of acute cholecystitis. 4. 5 mm pulmonary nodule at the right lung base. No follow-up recommended. This recommendation follows the consensus statement: Guidelines for Management of Incidental Pulmonary Nodules Detected on CT Images: From the Fleischner Society 2017; Radiology 2017; [id number]:228-243. 5.  Aortic Atherosclerosis (ICD10-I70.0). Electronically Signed: By: Prudencio Pair M.D. On: 07/22/2019 00:12   DG CHEST PORT 1 VIEW  Result Date: 07/23/2019 CLINICAL DATA:  Tachypnea EXAM: PORTABLE CHEST 1 VIEW COMPARISON:  07/21/2019 FINDINGS: The heart size and mediastinal contours are within normal limits. Aortic atherosclerosis. Both lungs are clear. The visualized skeletal structures are unremarkable. IMPRESSION: No active disease. Electronically Signed   By: Donavan Foil M.D.   On: 07/23/2019 19:39   DG Humerus Right  Result Date: 07/24/2019 CLINICAL DATA:  Unwitnessed fall EXAM: RIGHT HUMERUS - 2+ VIEW COMPARISON:  Shoulder series today FINDINGS: Degenerative changes in the right AC and glenohumeral joints. No acute bony abnormality. Specifically, no fracture, subluxation, or dislocation. IMPRESSION: No acute bony abnormality. Electronically Signed   By: Rolm Baptise M.D.   On: 07/24/2019 20:56   ECHOCARDIOGRAM COMPLETE  Result Date: 07/23/2019    ECHOCARDIOGRAM REPORT   Patient Name:   Heather Petty Date of Exam: 07/23/2019 Medical Rec #:  [id number]        Height:       61.0 in Accession #:    [id number]       Weight:       242.5 lb Date of Birth:  August 18, 1950        BSA:          2.050 m Patient Age:    60 years         BP:           135/56 mmHg Patient Gender: F                HR:           84 bpm. Exam Location:  Inpatient Procedure: 2D Echo, Cardiac Doppler and Color Doppler Indications:    Bacteremia  History:         Patient has prior history of Echocardiogram examinations, most                 recent 07/20/2018. Risk Factors:Diabetes, Hypertension,  Dyslipidemia and Former Smoker. MRSA bacteremia.  Sonographer:    Clayton Lefort RDCS (AE) Referring Phys: [place]  1. Left ventricular ejection fraction, by estimation, is 65 to 70%. The left ventricle has normal function. The left ventricle has no regional wall motion abnormalities. There is mild concentric left ventricular hypertrophy. Left ventricular diastolic parameters are consistent with Grade I diastolic dysfunction (impaired relaxation).  2. Right ventricular systolic function is normal. The right ventricular size is normal. There is normal pulmonary artery systolic pressure. The estimated right ventricular systolic pressure is 91.6 mmHg.  3. The mitral valve is normal in structure. No evidence of mitral valve regurgitation. No evidence of mitral stenosis.  4. The aortic valve has an indeterminant number of cusps. Aortic valve regurgitation is not visualized. Mild to moderate aortic valve sclerosis/calcification is present, without any evidence of aortic stenosis.  5. The inferior vena cava is normal in size with greater than 50% respiratory variability, suggesting right atrial pressure of 3 mmHg. Conclusion(s)/Recommendation(s): No evidence of valvular vegetations on this transthoracic echocardiogram. Would recommend a transesophageal echocardiogram to exclude infective endocarditis if clinically indicated. FINDINGS  Left Ventricle: Left ventricular ejection fraction, by estimation, is 65 to 70%. The left ventricle has normal function. The left ventricle has no regional wall motion abnormalities. The left ventricular internal cavity size was normal in size. There is  mild concentric left ventricular hypertrophy. Left ventricular diastolic parameters are consistent with Grade I diastolic dysfunction (impaired relaxation). Normal left  ventricular filling pressure. Right Ventricle: The right ventricular size is normal. No increase in right ventricular wall thickness. Right ventricular systolic function is normal. There is normal pulmonary artery systolic pressure. The tricuspid regurgitant velocity is 2.48 m/s, and  with an assumed right atrial pressure of 3 mmHg, the estimated right ventricular systolic pressure is 38.4 mmHg. Left Atrium: Left atrial size was normal in size. Right Atrium: Right atrial size was normal in size. Pericardium: There is no evidence of pericardial effusion. Mitral Valve: The mitral valve is normal in structure. Normal mobility of the mitral valve leaflets. Mild mitral annular calcification. No evidence of mitral valve regurgitation. No evidence of mitral valve stenosis. MV peak gradient, 3.5 mmHg. The mean mitral valve gradient is 2.0 mmHg. Tricuspid Valve: The tricuspid valve is normal in structure. Tricuspid valve regurgitation is trivial. No evidence of tricuspid stenosis. Aortic Valve: The aortic valve has an indeterminant number of cusps. Aortic valve regurgitation is not visualized. Mild to moderate aortic valve sclerosis/calcification is present, without any evidence of aortic stenosis. Aortic valve mean gradient measures 5.0 mmHg. Aortic valve peak gradient measures 12.7 mmHg. Aortic valve area, by VTI measures 2.20 cm. Pulmonic Valve: The pulmonic valve was normal in structure. Pulmonic valve regurgitation is not visualized. No evidence of pulmonic stenosis. Aorta: The aortic root is normal in size and structure. Venous: The inferior vena cava is normal in size with greater than 50% respiratory variability, suggesting right atrial pressure of 3 mmHg. IAS/Shunts: The interatrial septum appears to be lipomatous. No atrial level shunt detected by color flow Doppler.  LEFT VENTRICLE PLAX 2D LVIDd:         3.50 cm  Diastology LVIDs:         2.20 cm  LV e' lateral:   8.38 cm/s LV PW:         1.20 cm  LV E/e'  lateral: 9.9 LV IVS:        1.10 cm  LV e' medial:  7.94 cm/s LVOT diam:     1.70 cm  LV E/e' medial:  10.4 LV SV:         64 LV SV Index:   31 LVOT Area:     2.27 cm  RIGHT VENTRICLE             IVC RV Basal diam:  3.00 cm     IVC diam: 1.50 cm RV S prime:     11.00 cm/s TAPSE (M-mode): 1.6 cm LEFT ATRIUM             Index       RIGHT ATRIUM           Index LA diam:        2.40 cm 1.17 cm/m  RA Area:     11.10 cm LA Vol (A2C):   54.7 ml 26.68 ml/m RA Volume:   22.60 ml  11.02 ml/m LA Vol (A4C):   36.9 ml 18.00 ml/m LA Biplane Vol: 45.4 ml 22.15 ml/m  AORTIC VALVE AV Area (Vmax):    2.10 cm AV Area (Vmean):   2.23 cm AV Area (VTI):     2.20 cm AV Vmax:           178.00 cm/s AV Vmean:          109.000 cm/s AV VTI:            0.293 m AV Peak Grad:      12.7 mmHg AV Mean Grad:      5.0 mmHg LVOT Vmax:         165.00 cm/s LVOT Vmean:        107.000 cm/s LVOT VTI:          0.284 m LVOT/AV VTI ratio: 0.97  AORTA Ao Root diam: 2.60 cm Ao Asc diam:  3.20 cm MITRAL VALVE                TRICUSPID VALVE MV Area (PHT): 2.69 cm     TR Peak grad:   24.6 mmHg MV Peak grad:  3.5 mmHg     TR Vmax:        248.00 cm/s MV Mean grad:  2.0 mmHg MV Vmax:       0.94 m/s     SHUNTS MV Vmean:      58.4 cm/s    Systemic VTI:  0.28 m MV Decel Time: 282 msec     Systemic Diam: 1.70 cm MV E velocity: 82.60 cm/s MV A velocity: 102.00 cm/s MV E/A ratio:  0.81 Fransico Him MD Electronically signed by Fransico Him MD Signature Date/Time: 07/23/2019/4:37:10 PM    Final    [place] EKG SITE RITE  Result Date: 07/28/2019 If Site Rite image not attached, placement could not be confirmed due to current cardiac rhythm.   Microbiology: Recent Results (from the past 240 hour(s))  Culture, blood (Routine x 2)     Status: Abnormal   Collection Time: 07/21/19 10:01 PM   Specimen: BLOOD RIGHT HAND  Result Value Ref Range Status   Specimen Description BLOOD RIGHT HAND  Final   Special Requests   Final    BOTTLES DRAWN AEROBIC ONLY Blood  Culture adequate volume   Culture  Setup Time   Final    GRAM POSITIVE COCCI IN CHAINS IN CLUSTERS AEROBIC BOTTLE ONLY CRITICAL RESULT CALLED TO, READ BACK BY AND VERIFIED WITH: Jene Every PHARMD 1505 07/22/19 A BROWNING Performed at [facility], [place] [address]  [place], [place] [address]    Culture (A)  Final    STAPHYLOCOCCUS AUREUS SUSCEPTIBILITIES PERFORMED ON PREVIOUS CULTURE WITHIN THE LAST 5 DAYS. ENTEROCOCCUS FAECALIS STAPHYLOCOCCUS HOMINIS    Report Status 07/25/2019 FINAL  Final   Organism ID, Bacteria ENTEROCOCCUS FAECALIS  Final   Organism ID, Bacteria STAPHYLOCOCCUS HOMINIS  Final      Susceptibility   Enterococcus faecalis - MIC*    AMPICILLIN <=2 SENSITIVE Sensitive     VANCOMYCIN 1 SENSITIVE Sensitive     GENTAMICIN SYNERGY SENSITIVE Sensitive     * ENTEROCOCCUS FAECALIS   Staphylococcus hominis - MIC*    CIPROFLOXACIN <=0.5 SENSITIVE Sensitive     ERYTHROMYCIN >=8 RESISTANT Resistant     GENTAMICIN <=0.5 SENSITIVE Sensitive     OXACILLIN <=0.25 SENSITIVE Sensitive     TETRACYCLINE >=16 RESISTANT Resistant     VANCOMYCIN 1 SENSITIVE Sensitive     TRIMETH/SULFA <=10 SENSITIVE Sensitive     CLINDAMYCIN <=0.25 SENSITIVE Sensitive     RIFAMPIN <=0.5 SENSITIVE Sensitive     Inducible Clindamycin NEGATIVE Sensitive     * STAPHYLOCOCCUS HOMINIS  Blood Culture ID Panel (Reflexed)     Status: Abnormal   Collection Time: 07/21/19 10:01 PM  Result Value Ref Range Status   Enterococcus species DETECTED (A) NOT DETECTED Final    Comment: CRITICAL RESULT CALLED TO, READ BACK BY AND VERIFIED WITH: Jene Every PHARMD 1505 07/22/19 A BROWNING    Vancomycin resistance NOT DETECTED NOT DETECTED Final   Listeria monocytogenes NOT DETECTED NOT DETECTED Final   Staphylococcus species DETECTED (A) NOT DETECTED Final    Comment: CRITICAL RESULT CALLED TO, READ BACK BY AND VERIFIED WITH: Jene Every PHARMD 1505 07/22/19 A BROWNING    Staphylococcus aureus (BCID) DETECTED (A) NOT DETECTED  Final    Comment: Methicillin (oxacillin)-resistant Staphylococcus aureus (MRSA). MRSA is predictably resistant to beta-lactam antibiotics (except ceftaroline). Preferred therapy is vancomycin unless clinically contraindicated. Patient requires contact precautions if  hospitalized. CRITICAL RESULT CALLED TO, READ BACK BY AND VERIFIED WITH: Jene Every PHARMD 1505 07/22/19 A BROWNING    Methicillin resistance DETECTED (A) NOT DETECTED Final    Comment: CRITICAL RESULT CALLED TO, READ BACK BY AND VERIFIED WITH: Jene Every PHARMD 1505 07/22/19 A BROWNING    Streptococcus species NOT DETECTED NOT DETECTED Final   Streptococcus agalactiae NOT DETECTED NOT DETECTED Final   Streptococcus pneumoniae NOT DETECTED NOT DETECTED Final   Streptococcus pyogenes NOT DETECTED NOT DETECTED Final   Acinetobacter baumannii NOT DETECTED NOT DETECTED Final   Enterobacteriaceae species NOT DETECTED NOT DETECTED Final   Enterobacter cloacae complex NOT DETECTED NOT DETECTED Final   Escherichia coli NOT DETECTED NOT DETECTED Final   Klebsiella oxytoca NOT DETECTED NOT DETECTED Final   Klebsiella pneumoniae NOT DETECTED NOT DETECTED Final   Proteus species NOT DETECTED NOT DETECTED Final   Serratia marcescens NOT DETECTED NOT DETECTED Final   Haemophilus influenzae NOT DETECTED NOT DETECTED Final   Neisseria meningitidis NOT DETECTED NOT DETECTED Final   Pseudomonas aeruginosa NOT DETECTED NOT DETECTED Final   Candida albicans NOT DETECTED NOT DETECTED Final   Candida glabrata NOT DETECTED NOT DETECTED Final   Candida krusei NOT DETECTED NOT DETECTED Final   Candida parapsilosis NOT DETECTED NOT DETECTED Final   Candida tropicalis NOT DETECTED NOT DETECTED Final    Comment: Performed at [facility], [place]. [address] [place], [place] [address]  Culture, blood (Routine x 2)     Status: Abnormal   Collection  Time: 07/21/19 10:05 PM   Specimen: BLOOD RIGHT FOREARM  Result Value Ref Range Status   Specimen  Description BLOOD RIGHT FOREARM  Final   Special Requests   Final    BOTTLES DRAWN AEROBIC ONLY Blood Culture adequate volume   Culture  Setup Time   Final    GRAM POSITIVE COCCI IN CLUSTERS AEROBIC BOTTLE ONLY CRITICAL VALUE NOTED.  VALUE IS CONSISTENT WITH PREVIOUSLY REPORTED AND CALLED VALUE. Performed at [facility], [place] [address], [place], [place] [address]    Culture METHICILLIN RESISTANT STAPHYLOCOCCUS AUREUS (A)  Final   Report Status 07/24/2019 FINAL  Final   Organism ID, Bacteria METHICILLIN RESISTANT STAPHYLOCOCCUS AUREUS  Final      Susceptibility   Methicillin resistant staphylococcus aureus - MIC*    CIPROFLOXACIN >=8 RESISTANT Resistant     ERYTHROMYCIN >=8 RESISTANT Resistant     GENTAMICIN <=0.5 SENSITIVE Sensitive     OXACILLIN >=4 RESISTANT Resistant     TETRACYCLINE <=1 SENSITIVE Sensitive     VANCOMYCIN <=0.5 SENSITIVE Sensitive     TRIMETH/SULFA <=10 SENSITIVE Sensitive     CLINDAMYCIN <=0.25 SENSITIVE Sensitive     RIFAMPIN <=0.5 SENSITIVE Sensitive     Inducible Clindamycin NEGATIVE Sensitive     * METHICILLIN RESISTANT STAPHYLOCOCCUS AUREUS  Urine culture     Status: Abnormal   Collection Time: 07/21/19 10:07 PM   Specimen: Urine, Catheterized  Result Value Ref Range Status   Specimen Description URINE, CATHETERIZED  Final   Special Requests   Final    NONE Performed at [facility], [place] [address] [place], [place] [address]    Culture 80,000 COLONIES/mL KLEBSIELLA PNEUMONIAE (A)  Final   Report Status 07/23/2019 FINAL  Final   Organism ID, Bacteria KLEBSIELLA PNEUMONIAE (A)  Final      Susceptibility   Klebsiella pneumoniae - MIC*    AMPICILLIN >=32 RESISTANT Resistant     CEFAZOLIN <=4 SENSITIVE Sensitive     CEFTRIAXONE <=0.25 SENSITIVE Sensitive     CIPROFLOXACIN <=0.25 SENSITIVE Sensitive     GENTAMICIN <=1 SENSITIVE Sensitive     IMIPENEM <=0.25 SENSITIVE Sensitive     NITROFURANTOIN 32 SENSITIVE Sensitive     TRIMETH/SULFA  <=20 SENSITIVE Sensitive     AMPICILLIN/SULBACTAM 4 SENSITIVE Sensitive     PIP/TAZO <=4 SENSITIVE Sensitive     * 80,000 COLONIES/mL KLEBSIELLA PNEUMONIAE  SARS Coronavirus 2 by RT PCR (hospital order, performed in [place] hospital lab) Nasopharyngeal Nasopharyngeal Swab     Status: None   Collection Time: 07/21/19 10:17 PM   Specimen: Nasopharyngeal Swab  Result Value Ref Range Status   SARS Coronavirus 2 NEGATIVE NEGATIVE Final    Comment: (NOTE) SARS-CoV-2 target nucleic acids are NOT DETECTED.  The SARS-CoV-2 RNA is generally detectable in upper and lower respiratory specimens during the acute phase of infection. The lowest concentration of SARS-CoV-2 viral copies this assay can detect is 250 copies / mL. A negative result does not preclude SARS-CoV-2 infection and should not be used as the sole basis for treatment or other patient management decisions.  A negative result may occur with improper specimen collection / handling, submission of specimen other than nasopharyngeal swab, presence of viral mutation(s) within the areas targeted by this assay, and inadequate number of viral copies (<250 copies / mL). A negative result must be combined with clinical observations, patient history, and epidemiological information.  Fact Sheet for Patients:   StrictlyIdeas.no  Fact Sheet for Healthcare Providers:  BankingDealers.co.za  This test is not yet approved or  cleared by the [place] and has been authorized for detection and/or diagnosis of SARS-CoV-2 by FDA under an Emergency Use Authorization (EUA).  This EUA will remain in effect (meaning this test can be used) for the duration of the COVID-19 declaration under Section 564(b)(1) of the Act, 21 U.S.C. section 360bbb-3(b)(1), unless the authorization is terminated or revoked sooner.  Performed at [facility], [place] [address], [place], [place] [address]     Respiratory Panel by PCR     Status: None   Collection Time: 07/22/19 12:58 AM   Specimen: Nasopharyngeal Swab; Respiratory  Result Value Ref Range Status   Adenovirus NOT DETECTED NOT DETECTED Final   Coronavirus 229E NOT DETECTED NOT DETECTED Final    Comment: (NOTE) The Coronavirus on the Respiratory Panel, DOES NOT test for the novel  Coronavirus (2019 nCoV)    Coronavirus HKU1 NOT DETECTED NOT DETECTED Final   Coronavirus NL63 NOT DETECTED NOT DETECTED Final   Coronavirus OC43 NOT DETECTED NOT DETECTED Final   Metapneumovirus NOT DETECTED NOT DETECTED Final   Rhinovirus / Enterovirus NOT DETECTED NOT DETECTED Final   Influenza A NOT DETECTED NOT DETECTED Final   Influenza B NOT DETECTED NOT DETECTED Final   Parainfluenza Virus 1 NOT DETECTED NOT DETECTED Final   Parainfluenza Virus 2 NOT DETECTED NOT DETECTED Final   Parainfluenza Virus 3 NOT DETECTED NOT DETECTED Final   Parainfluenza Virus 4 NOT DETECTED NOT DETECTED Final   Respiratory Syncytial Virus NOT DETECTED NOT DETECTED Final   Bordetella pertussis NOT DETECTED NOT DETECTED Final   Chlamydophila pneumoniae NOT DETECTED NOT DETECTED Final   Mycoplasma pneumoniae NOT DETECTED NOT DETECTED Final    Comment: Performed at [facility], [place]. [address] [place], [place] [address]  Culture, blood (routine x 2)     Status: Abnormal   Collection Time: 07/22/19  8:11 PM   Specimen: BLOOD  Result Value Ref Range Status   Specimen Description BLOOD RIGHT ANTECUBITAL  Final   Special Requests   Final    BOTTLES DRAWN AEROBIC ONLY Blood Culture results may not be optimal due to an inadequate volume of blood received in culture bottles   Culture  Setup Time   Final    GRAM POSITIVE COCCI IN CLUSTERS AEROBIC BOTTLE ONLY CRITICAL RESULT CALLED TO, READ BACK BY AND VERIFIED WITH: J. FRENS PHrmd, at 1353 07/23/19 by d. vanhook    Culture (A)  Final    STAPHYLOCOCCUS AUREUS SUSCEPTIBILITIES PERFORMED ON PREVIOUS CULTURE  WITHIN THE LAST 5 DAYS. Performed at [facility], [place] [address] [place],  [address]    Report Status 07/25/2019 FINAL  Final  Culture, blood (routine x 2)     Status: Abnormal   Collection Time: 07/22/19  8:26 PM   Specimen: BLOOD LEFT HAND  Result Value Ref Range Status   Specimen Description BLOOD LEFT HAND  Final   Special Requests   Final    BOTTLES DRAWN AEROBIC ONLY Blood Culture results may not be optimal due to an inadequate volume of blood received in culture bottles   Culture  Setup Time   Final    GRAM POSITIVE COCCI IN CLUSTERS AEROBIC BOTTLE ONLY CRITICAL VALUE NOTED.  VALUE IS CONSISTENT WITH PREVIOUSLY REPORTED AND CALLED VALUE.    Culture (A)  Final    STAPHYLOCOCCUS AUREUS SUSCEPTIBILITIES PERFORMED ON PREVIOUS CULTURE WITHIN THE LAST 5 DAYS. Performed at  [facility], [place] [address] [place], [place] [address]    Report Status 07/25/2019 FINAL  Final  Culture, blood (routine x 2)     Status: Abnormal   Collection Time: 07/24/19 10:33 AM   Specimen: BLOOD LEFT ARM  Result Value Ref Range Status   Specimen Description BLOOD LEFT ARM  Final   Special Requests   Final    BOTTLES DRAWN AEROBIC ONLY Blood Culture results may not be optimal due to an inadequate volume of blood received in culture bottles   Culture  Setup Time   Final    AEROBIC BOTTLE ONLY GRAM POSITIVE COCCI IN CLUSTERS CRITICAL VALUE NOTED.  VALUE IS CONSISTENT WITH PREVIOUSLY REPORTED AND CALLED VALUE.    Culture (A)  Final    STAPHYLOCOCCUS AUREUS SUSCEPTIBILITIES PERFORMED ON PREVIOUS CULTURE WITHIN THE LAST 5 DAYS. Performed at [facility], [place] [address], [place], [place] [address]    Report Status 07/26/2019 FINAL  Final  Culture, blood (routine x 2)     Status: Abnormal   Collection Time: 07/24/19 10:33 AM   Specimen: BLOOD LEFT HAND  Result Value Ref Range Status   Specimen Description BLOOD LEFT HAND  Final   Special Requests   Final    BOTTLES DRAWN  AEROBIC ONLY Blood Culture results may not be optimal due to an inadequate volume of blood received in culture bottles   Culture  Setup Time   Final    AEROBIC BOTTLE ONLY GRAM POSITIVE COCCI IN CLUSTERS CRITICAL VALUE NOTED.  VALUE IS CONSISTENT WITH PREVIOUSLY REPORTED AND CALLED VALUE.    Culture (A)  Final    STAPHYLOCOCCUS AUREUS SUSCEPTIBILITIES PERFORMED ON PREVIOUS CULTURE WITHIN THE LAST 5 DAYS. Performed at [facility], [place] [address] [place], [place] [address]    Report Status 07/26/2019 FINAL  Final  Culture, blood (routine x 2)     Status: None   Collection Time: 07/25/19  1:25 PM   Specimen: BLOOD  Result Value Ref Range Status   Specimen Description BLOOD LEFT ANTECUBITAL  Final   Special Requests   Final    BOTTLES DRAWN AEROBIC AND ANAEROBIC Blood Culture adequate volume   Culture   Final    NO GROWTH 5 DAYS Performed at [facility], [place] [address], [place], [place] [address]    Report Status 07/30/2019 FINAL  Final  Culture, blood (routine x 2)     Status: None   Collection Time: 07/25/19  1:30 PM   Specimen: BLOOD LEFT HAND  Result Value Ref Range Status   Specimen Description BLOOD LEFT HAND  Final   Special Requests   Final    BOTTLES DRAWN AEROBIC AND ANAEROBIC Blood Culture adequate volume   Culture   Final    NO GROWTH 5 DAYS Performed at [facility], [place] [address] [place], [place] [address]    Report Status 07/30/2019 FINAL  Final  Culture, Urine     Status: None   Collection Time: 07/28/19 12:36 PM   Specimen: Urine, Random  Result Value Ref Range Status   Specimen Description URINE, RANDOM  Final   Special Requests NONE  Final   Culture   Final    NO GROWTH Performed at [facility], [place] [address], [place],  [address]    Report Status 07/29/2019 FINAL  Final     Labs: Basic Metabolic Panel: Recent Labs  Lab 07/26/19 0358 07/27/19 [phone] 07/28/19 0357  07/29/19 0415 07/31/19 0527  NA 136 138 137 135  135  K 3.3* 3.2* 3.1* 3.5 2.9*  CL 102 102 100 102 101  CO2 _0 GLUCOSE 174* 87 66* 77 163*  BUN 6* 7* 7* 8 9  CREATININE 0.57 0.53 0.65 0.70 0.78  CALCIUM 7.9* 8.0* 7.8* 7.5* 7.7*  MG  --   --  1.6*  --  1.8   Liver Function Tests: No results for input(s): AST, ALT, ALKPHOS, BILITOT, PROT, ALBUMIN in the last 168 hours. No results for input(s): LIPASE, AMYLASE in the last 168 hours. No results for input(s): AMMONIA in the last 168 hours. CBC: Recent Labs  Lab 07/27/19 0718  WBC 13.2*  HGB 8.4*  HCT 25.9*  MCV 90.6  PLT 157   Cardiac Enzymes: No results for input(s): CKTOTAL, CKMB, CKMBINDEX, TROPONINI in the last 168 hours. BNP: BNP (last 3 results) No results for input(s): BNP in the last 8760 hours.  ProBNP (last 3 results) No results for input(s): PROBNP in the last 8760 hours.  CBG: Recent Labs  Lab 07/30/19 1146 07/30/19 1644 07/30/19 2044 07/31/19 0732 07/31/19 1213  GLUCAP 145* 261* 176* 149* 176*       Signed:  Annita Brod, MD [facility] 07/31/2019, 1:08 PM

## 2019-07-31 NOTE — TOC Progression Note (Addendum)
Transition of Care [facility]) - Progression Note    Patient Details  Name: Heather Petty MRN: [id number] Date of Birth: [birth year]  Transition of Care [facility]) CM/SW [place], [place] Phone Number: 07/31/2019, 3:33 PM  Clinical Narrative:     CSW contacted daughter and confirmed choice for [facility]. CSW contacted Olivia Mackie from [place] and confirmed bed for today pending negative covid test.    Covid test resulted negative.   Patient will DC to: [facility] Anticipated DC date: 07/31/19 Family notified: Daughter Buyer, retail by: Corey Harold   Per MD patient ready for DC to [facility]. RN, patient, patient's family, and facility notified of DC. Discharge Summary and FL2 sent to facility. RN to call report prior to discharge ([phone]). DC packet on chart. Ambulance transport requested for patient.   CSW will sign off for now as social work intervention is no longer needed. Please consult [place] again if new needs arise.   Expected Discharge Plan: [place] Barriers to Discharge: Continued Medical Work up, Ship broker  Expected Discharge Plan and Services Expected Discharge Plan: [place] Choice: [place] arrangements for the past 2 months: Single Family Home Expected Discharge Date: 07/31/19                                     Social Determinants of Health (SDOH) Interventions    Readmission Risk Interventions No flowsheet data found.

## 2019-07-31 NOTE — Progress Notes (Signed)
Physical Therapy Treatment Patient Details Name: Heather Petty MRN: [id number] DOB: 1950-09-05 Today's Date: 07/31/2019    History of Present Illness Pt is a 69 y.o. F with hx HTN, IDDM, obesity, hx CVA, schizophrenia/depression and asthma who presented with acute AMS. Reportedly found by her daughter slumped over in her bathtub, family had last spoken to her 2 days ago.  Pt admitted with sepsis and mild rhabdo.    PT Comments    Pt requiring max encouragement and education to participate.  Once convinced to work with therapy , she was able to complete transfers with min guard to min A.  Pt with decreased safety awarness and problem solving as she wants to return home with her dog and be independent , but expresses that she doesn't want to move and work with therapy or get up to bsc (wants to use purewick).  Will cont to benefit from acute PT and SNF at d/c.    Follow Up Recommendations  SNF;Supervision/Assistance - 24 hour     Equipment Recommendations  None recommended by PT    Recommendations for Other Services       Precautions / Restrictions Precautions Precautions: Fall    Mobility  Bed Mobility   Bed Mobility: Supine to Sit     Supine to sit: Min assist     General bed mobility comments: min A for hand to pull up on - otherwise able to do with supervision  Transfers Overall transfer level: Needs assistance Equipment used: 1 person hand held assist Transfers: Sit to/from Stand;Stand Pivot Transfers Sit to Stand: Min guard Stand pivot transfers: Min guard       General transfer comment: sit to stand x 3 with cues for safe hand placement  Ambulation/Gait Ambulation/Gait assistance: Min guard Gait Distance (Feet): 4 Feet (x2) Assistive device: 1 person hand held assist Gait Pattern/deviations: Shuffle;Trunk flexed Gait velocity: decreased   General Gait Details: max cues to participate but able to perform with min guard for steadying   Stairs              Wheelchair Mobility    Modified Rankin (Stroke Patients Only)       Balance Overall balance assessment: Needs assistance Sitting-balance support: Feet supported;No upper extremity supported Sitting balance-Leahy Scale: Good Sitting balance - Comments: Sitting balance at EOB for 10 mins with weight shifting and pertubations from washing back, pt able to wash front.   Standing balance support: During functional activity;Single extremity supported Standing balance-Leahy Scale: Fair Standing balance comment: single UE support in standing during ADLs - stood for 2 mins                            Cognition Arousal/Alertness: Awake/alert Behavior During Therapy: Flat affect Overall Cognitive Status: No family/caregiver present to determine baseline cognitive functioning Area of Impairment: Problem solving;Safety/judgement                         Safety/Judgement: Decreased awareness of safety;Decreased awareness of deficits   Problem Solving: Decreased initiation;Requires verbal cues General Comments: Pt requiring max encouragement to participate.  States things like "I just want to stay in bed," "I don't want to go to rehab, they will bother me," "Won't they have a purewick at rehab."  However, she also expresses that she wants to go home to her dog.      Exercises      General Comments General  comments (skin integrity, edema, etc.): Pt had urinated in bed at arrival.  Pt was assisted with cleaning at EOB.  She had c/o lightheadedness with initial sitting but VSS and eased after a few mins.      Pertinent Vitals/Pain Pain Assessment: No/denies pain    Home Living                      Prior Function            PT Goals (current goals can now be found in the care plan section) Acute Rehab PT Goals Patient Stated Goal: feel better PT Goal Formulation: With patient Time For Goal Achievement: 08/06/19 Potential to Achieve Goals:  Good Progress towards PT goals: Progressing toward goals    Frequency    Min 2X/week      PT Plan Current plan remains appropriate    Co-evaluation              AM-PAC PT "6 Clicks" Mobility   Outcome Measure  Help needed turning from your back to your side while in a flat bed without using bedrails?: A Little Help needed moving from lying on your back to sitting on the side of a flat bed without using bedrails?: A Little Help needed moving to and from a bed to a chair (including a wheelchair)?: A Little Help needed standing up from a chair using your arms (e.g., wheelchair or bedside chair)?: None Help needed to walk in hospital room?: A Little Help needed climbing 3-5 steps with a railing? : A Little 6 Click Score: 19    End of Session Equipment Utilized During Treatment: Gait belt Activity Tolerance: Other (comment) (self limiting) Patient left: in chair;with chair alarm set;with call bell/phone within reach Nurse Communication: Mobility status PT Visit Diagnosis: Unsteadiness on feet (R26.81);Muscle weakness (generalized) (M62.81)     Time: 4008-6761 PT Time Calculation (min) (ACUTE ONLY): 40 min  Charges:  $Gait Training: 8-22 mins $Therapeutic Activity: 23-37 mins                     Abran Richard, PT Acute Rehab Services Pager [phone] [facility] 07/31/2019, 11:03 AM

## 2019-07-31 NOTE — NC FL2 (Signed)
[place] LEVEL OF CARE SCREENING TOOL     IDENTIFICATION  Patient Name: Heather Petty Birthdate: 1950-06-21 Sex: female Admission Date (Current Location): 07/21/2019  [facility] [place] Number:  Herbalist and Address:  [facility] [place], [place] [address] [place], [place] [address]      Provider Number: [id number]  Attending Physician Name and Address:  Annita Brod, MD  Relative Name and Phone Number:  [name], [phone]    Current Level of Care: Hospital Recommended Level of Care: [place] Prior Approval Number:    Date Approved/Denied:   PASRR Number: [id number]  Discharge Plan: SNF    Current Diagnoses: Patient Active Problem List   Diagnosis Date Noted  . DKA (diabetic ketoacidoses) ([place]) 07/22/2019  . Rhabdomyolysis 07/22/2019  . Acute metabolic encephalopathy 58/85/0277  . MRSA bacteremia 07/22/2019  . Enterococcal bacteremia 07/22/2019  . Vitiligo 07/22/2019  . 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 Sepsis ([place]) 07/19/2018  . Ventricular tachycardia, sustained ([place]) 07/19/2018  . NSTEMI (non-ST elevated myocardial infarction) ([place]) 07/19/2018  . Wound infection 04/03/2018  . Morbid obesity with BMI of 50.0-59.9, adult ([place]) 04/03/2018  . Elevated alkaline phosphatase level 04/03/2018  . Asthma exacerbation 03/14/2018  . Hypotension 03/14/2018  . Lactic acidosis 03/14/2018  . GERD (gastroesophageal reflux disease) 03/14/2018  . Acute renal failure superimposed on stage 3 chronic kidney disease ([place]) 03/14/2018  . Macrocytic anemia 03/14/2018  . Abnormal LFTs 03/14/2018  . Acute kidney injury ([place]) 02/28/2018  . Wound of sacral region 02/28/2018  . Hypothermia 02/28/2018  . Stage II pressure ulcer of sacral region ([place]) 02/15/2018  . DM II (diabetes mellitus, type II), controlled ([place]) 02/09/2018  . Schizophrenia ([place]) 02/09/2018  . Pressure ulcer 02/06/2018  . Acute renal  failure (ARF) ([place]) 02/05/2018  . Hyperlipidemia associated with type 2 diabetes mellitus ([place]) 02/05/2018  . Hyperkalemia 02/05/2018  . Dermatitis 02/05/2018  . Open back wound 02/05/2018  . Dysuria 02/05/2018  . Paranoid schizophrenia ([place])   . AKI (acute kidney injury) ([place]) 12/17/2017  . Type II diabetes mellitus with renal manifestations ([place]) 12/17/2017  . HTN (hypertension) 12/17/2017  . Gout     Orientation RESPIRATION BLADDER Height & Weight     Self, Time, Situation, Place  Normal, O2 (1-2l/m intermittantly) Continent Weight: 242 lb 8.1 oz (110 kg) Height:  5' 1"  (154.9 cm)  BEHAVIORAL SYMPTOMS/MOOD NEUROLOGICAL BOWEL NUTRITION STATUS      Continent Diet  AMBULATORY STATUS COMMUNICATION OF NEEDS Skin   Extensive Assist Verbally Normal                       Personal Care Assistance Level of Assistance  Bathing, Feeding, Dressing Bathing Assistance: Limited assistance Feeding assistance: Independent Dressing Assistance: Limited assistance     Functional Limitations Info  Sight, Hearing, Speech Sight Info: Adequate Hearing Info: Adequate Speech Info: Adequate    SPECIAL CARE FACTORS FREQUENCY  PT (By licensed PT), OT (By licensed OT)     PT Frequency: 5x OT Frequency: 5x            Contractures Contractures Info: Not present    Additional Factors Info  Code Status, Allergies Code Status Info: Full Allergies Info: Lasix (flurosemide)           Current Medications (07/31/2019):  This is the current hospital active medication list Current Facility-Administered Medications  Medication Dose Route Frequency Provider Last Rate Last Admin  .  acetaminophen (TYLENOL) tablet 650 mg  650 mg Oral Q6H PRN Shela Leff, MD   650 mg at 07/30/19 0014   Or  . acetaminophen (TYLENOL) suppository 650 mg  650 mg Rectal Q6H PRN Shela Leff, MD   650 mg at 07/22/19 2153  . albuterol (PROVENTIL) (2.5 MG/3ML) 0.083% nebulizer solution 2.5 mg  2.5 mg  Nebulization Q6H PRN Swayze, Ava, DO      . allopurinol (ZYLOPRIM) tablet 300 mg  300 mg Oral Daily Swayze, Ava, DO   300 mg at 07/31/19 1000  . ALPRAZolam Duanne Moron) tablet 0.25 mg  0.25 mg Oral TID PRN Swayze, Ava, DO   0.25 mg at 07/30/19 2055  . amLODipine (NORVASC) tablet 5 mg  5 mg Oral Daily Swayze, Ava, DO   5 mg at 07/31/19 1000  . ascorbic acid (VITAMIN C) tablet 500 mg  500 mg Oral BID Swayze, Ava, DO   500 mg at 07/31/19 1000  . B-complex with vitamin C tablet 1 tablet  1 tablet Oral Daily Swayze, Ava, DO   1 tablet at 07/31/19 0959  . benazepril (LOTENSIN) tablet 20 mg  20 mg Oral Daily Swayze, Ava, DO   20 mg at 07/31/19 0959  . bisacodyl (DULCOLAX) EC tablet 5 mg  5 mg Oral Daily PRN Swayze, Ava, DO      . Chlorhexidine Gluconate Cloth 2 % PADS 6 each  6 each Topical Daily Swayze, Ava, DO   6 each at 07/31/19 1000  . collagenase (SANTYL) ointment   Topical Daily Swayze, Ava, DO   Given at 07/31/19 1001  . cyanocobalamin ((VITAMIN B-12)) injection 1,000 mcg  1,000 mcg Intramuscular Weekly Swayze, Ava, DO   1,000 mcg at 07/25/19 1711  . enoxaparin (LOVENOX) injection 40 mg  40 mg Subcutaneous Q24H Shela Leff, MD   40 mg at 07/31/19 1325  . folic acid (FOLVITE) tablet 1 mg  1 mg Oral Daily Swayze, Ava, DO   1 mg at 07/31/19 1000  . Gerhardt's butt cream   Topical PRN Swayze, Ava, DO   Given at 07/28/19 1724  . ibuprofen (ADVIL) tablet 400 mg  400 mg Oral Q6H PRN Lang Snow, FNP   400 mg at 07/24/19 1540  . insulin aspart (novoLOG) injection 0-15 Units  0-15 Units Subcutaneous TID WC Danford, Suann Larry, MD   2 Units at 07/31/19 0800  . insulin aspart (novoLOG) injection 0-5 Units  0-5 Units Subcutaneous QHS Edwin Dada, MD   2 Units at 07/30/19 0011  . insulin glargine (LANTUS) injection 25 Units  25 Units Subcutaneous Q24H Swayze, Ava, DO   25 Units at 07/30/19 1748  . ipratropium-albuterol (DUONEB) 0.5-2.5 (3) MG/3ML nebulizer solution 3 mL  3 mL Nebulization  Q6H PRN Swayze, Ava, DO      . labetalol (NORMODYNE) injection 5 mg  5 mg Intravenous Q2H PRN Opyd, Ilene Qua, MD      . lactated ringers infusion   Intravenous Continuous Danford, Suann Larry, MD 100 mL/hr at 07/31/19 0500 Rate Verify at 07/31/19 0500  . metoprolol tartrate (LOPRESSOR) tablet 25 mg  25 mg Oral BID Swayze, Ava, DO   25 mg at 07/31/19 0959  . nystatin (MYCOSTATIN/NYSTOP) topical powder 1 application  1 g Topical BID PRN Swayze, Ava, DO   1 application at 56/81/27 1002  . nystatin (MYCOSTATIN/NYSTOP) topical powder   Topical BID Shela Leff, MD   Given at 07/30/19 2056  . polyethylene glycol (MIRALAX / GLYCOLAX) packet 17 g  17 g Oral Daily Swayze, Ava, DO   17 g at 07/31/19 0958  . pravastatin (PRAVACHOL) tablet 40 mg  40 mg Oral QHS Swayze, Ava, DO   40 mg at 07/30/19 2055  . sodium chloride flush (NS) 0.9 % injection 10-40 mL  10-40 mL Intracatheter Q12H Swayze, Ava, DO   10 mL at 07/31/19 1003  . sodium chloride flush (NS) 0.9 % injection 10-40 mL  10-40 mL Intracatheter PRN Swayze, Ava, DO      . traMADol (ULTRAM) tablet 50 mg  50 mg Oral Q6H PRN Swayze, Ava, DO   50 mg at 07/30/19 2238  . vancomycin (VANCOCIN) IVPB 1000 mg/200 mL premix  1,000 mg Intravenous Q12H Henri Medal, RPH 200 mL/hr at 07/31/19 1327 1,000 mg at 07/31/19 1327  . Vitamin D (Ergocalciferol) (DRISDOL) capsule 50,000 Units  50,000 Units Oral Q Mon Swayze, Ava, DO   50,000 Units at 07/28/19 0933  . zinc sulfate capsule 220 mg  220 mg Oral QHS Swayze, Ava, DO   220 mg at 07/30/19 2055     Discharge Medications: Please see discharge summary for a list of discharge medications.  Relevant Imaging Results:  Relevant Lab Results:   Additional Information SS# [place] Ileanna Gemmill, LCSW

## 2019-08-05 ENCOUNTER — Inpatient Hospital Stay (HOSPITAL_COMMUNITY): Payer: Medicare Other

## 2019-08-05 ENCOUNTER — Inpatient Hospital Stay (HOSPITAL_COMMUNITY)
Admission: EM | Admit: 2019-08-05 | Discharge: 2019-08-13 | DRG: 812 | Disposition: A | Discharge status: Skilled Nursing Facility | Payer: Medicare Other | Source: Skilled Nursing Facility | Attending: Internal Medicine | Admitting: Internal Medicine

## 2019-08-05 ENCOUNTER — Other Ambulatory Visit: Payer: Self-pay

## 2019-08-05 ENCOUNTER — Encounter (HOSPITAL_COMMUNITY): Payer: Self-pay | Admitting: Emergency Medicine

## 2019-08-05 DIAGNOSIS — B9562 Methicillin resistant Staphylococcus aureus infection as the cause of diseases classified elsewhere: Secondary | ICD-10-CM | POA: Diagnosis present

## 2019-08-05 DIAGNOSIS — Z7951 Long term (current) use of inhaled steroids: Secondary | ICD-10-CM | POA: Diagnosis not present

## 2019-08-05 DIAGNOSIS — R7881 Bacteremia: Secondary | ICD-10-CM | POA: Diagnosis present

## 2019-08-05 DIAGNOSIS — L80 Vitiligo: Secondary | ICD-10-CM | POA: Diagnosis not present

## 2019-08-05 DIAGNOSIS — L89312 Pressure ulcer of right buttock, stage 2: Secondary | ICD-10-CM | POA: Diagnosis present

## 2019-08-05 DIAGNOSIS — D528 Other folate deficiency anemias: Secondary | ICD-10-CM | POA: Diagnosis not present

## 2019-08-05 DIAGNOSIS — K7460 Unspecified cirrhosis of liver: Secondary | ICD-10-CM | POA: Diagnosis not present

## 2019-08-05 DIAGNOSIS — R7401 Elevation of levels of liver transaminase levels: Secondary | ICD-10-CM | POA: Diagnosis not present

## 2019-08-05 DIAGNOSIS — E860 Dehydration: Secondary | ICD-10-CM | POA: Diagnosis present

## 2019-08-05 DIAGNOSIS — Z20822 Contact with and (suspected) exposure to covid-19: Secondary | ICD-10-CM | POA: Diagnosis present

## 2019-08-05 DIAGNOSIS — R197 Diarrhea, unspecified: Secondary | ICD-10-CM | POA: Diagnosis not present

## 2019-08-05 DIAGNOSIS — N179 Acute kidney failure, unspecified: Secondary | ICD-10-CM | POA: Diagnosis not present

## 2019-08-05 DIAGNOSIS — N390 Urinary tract infection, site not specified: Secondary | ICD-10-CM | POA: Diagnosis present

## 2019-08-05 DIAGNOSIS — E7800 Pure hypercholesterolemia, unspecified: Secondary | ICD-10-CM | POA: Diagnosis present

## 2019-08-05 DIAGNOSIS — Z79899 Other long term (current) drug therapy: Secondary | ICD-10-CM

## 2019-08-05 DIAGNOSIS — Z794 Long term (current) use of insulin: Secondary | ICD-10-CM

## 2019-08-05 DIAGNOSIS — Z87891 Personal history of nicotine dependence: Secondary | ICD-10-CM

## 2019-08-05 DIAGNOSIS — E1165 Type 2 diabetes mellitus with hyperglycemia: Secondary | ICD-10-CM | POA: Diagnosis present

## 2019-08-05 DIAGNOSIS — Z8673 Personal history of transient ischemic attack (TIA), and cerebral infarction without residual deficits: Secondary | ICD-10-CM

## 2019-08-05 DIAGNOSIS — Z6841 Body Mass Index (BMI) 40.0 and over, adult: Secondary | ICD-10-CM | POA: Diagnosis not present

## 2019-08-05 DIAGNOSIS — R509 Fever, unspecified: Secondary | ICD-10-CM | POA: Diagnosis not present

## 2019-08-05 DIAGNOSIS — K219 Gastro-esophageal reflux disease without esophagitis: Secondary | ICD-10-CM | POA: Diagnosis present

## 2019-08-05 DIAGNOSIS — R21 Rash and other nonspecific skin eruption: Secondary | ICD-10-CM | POA: Diagnosis not present

## 2019-08-05 DIAGNOSIS — D649 Anemia, unspecified: Secondary | ICD-10-CM | POA: Diagnosis not present

## 2019-08-05 DIAGNOSIS — K7581 Nonalcoholic steatohepatitis (NASH): Secondary | ICD-10-CM | POA: Diagnosis not present

## 2019-08-05 DIAGNOSIS — R161 Splenomegaly, not elsewhere classified: Secondary | ICD-10-CM

## 2019-08-05 DIAGNOSIS — I10 Essential (primary) hypertension: Secondary | ICD-10-CM | POA: Diagnosis present

## 2019-08-05 DIAGNOSIS — F209 Schizophrenia, unspecified: Secondary | ICD-10-CM | POA: Diagnosis present

## 2019-08-05 DIAGNOSIS — L89152 Pressure ulcer of sacral region, stage 2: Secondary | ICD-10-CM | POA: Diagnosis not present

## 2019-08-05 DIAGNOSIS — R7989 Other specified abnormal findings of blood chemistry: Secondary | ICD-10-CM | POA: Diagnosis not present

## 2019-08-05 DIAGNOSIS — J45909 Unspecified asthma, uncomplicated: Secondary | ICD-10-CM | POA: Diagnosis present

## 2019-08-05 DIAGNOSIS — R188 Other ascites: Secondary | ICD-10-CM | POA: Diagnosis present

## 2019-08-05 DIAGNOSIS — M109 Gout, unspecified: Secondary | ICD-10-CM | POA: Diagnosis present

## 2019-08-05 DIAGNOSIS — Z792 Long term (current) use of antibiotics: Secondary | ICD-10-CM

## 2019-08-05 DIAGNOSIS — R748 Abnormal levels of other serum enzymes: Secondary | ICD-10-CM | POA: Diagnosis not present

## 2019-08-05 DIAGNOSIS — E785 Hyperlipidemia, unspecified: Secondary | ICD-10-CM | POA: Diagnosis present

## 2019-08-05 DIAGNOSIS — D638 Anemia in other chronic diseases classified elsewhere: Secondary | ICD-10-CM | POA: Diagnosis present

## 2019-08-05 DIAGNOSIS — S31000A Unspecified open wound of lower back and pelvis without penetration into retroperitoneum, initial encounter: Secondary | ICD-10-CM | POA: Diagnosis present

## 2019-08-05 DIAGNOSIS — K0889 Other specified disorders of teeth and supporting structures: Secondary | ICD-10-CM | POA: Diagnosis present

## 2019-08-05 DIAGNOSIS — G8929 Other chronic pain: Secondary | ICD-10-CM | POA: Diagnosis present

## 2019-08-05 DIAGNOSIS — L270 Generalized skin eruption due to drugs and medicaments taken internally: Secondary | ICD-10-CM | POA: Diagnosis not present

## 2019-08-05 DIAGNOSIS — I158 Other secondary hypertension: Secondary | ICD-10-CM | POA: Diagnosis not present

## 2019-08-05 DIAGNOSIS — L98429 Non-pressure chronic ulcer of back with unspecified severity: Secondary | ICD-10-CM | POA: Diagnosis not present

## 2019-08-05 DIAGNOSIS — K591 Functional diarrhea: Secondary | ICD-10-CM | POA: Diagnosis not present

## 2019-08-05 DIAGNOSIS — E876 Hypokalemia: Secondary | ICD-10-CM | POA: Diagnosis present

## 2019-08-05 DIAGNOSIS — S31000D Unspecified open wound of lower back and pelvis without penetration into retroperitoneum, subsequent encounter: Secondary | ICD-10-CM | POA: Diagnosis not present

## 2019-08-05 LAB — POC OCCULT BLOOD, ED: Fecal Occult Bld: NEGATIVE

## 2019-08-05 LAB — COMPREHENSIVE METABOLIC PANEL
ALT: 78 U/L — ABNORMAL HIGH (ref 0–44)
AST: 36 U/L (ref 15–41)
Albumin: 2.6 g/dL — ABNORMAL LOW (ref 3.5–5.0)
Alkaline Phosphatase: 440 U/L — ABNORMAL HIGH (ref 38–126)
Anion gap: 8 (ref 5–15)
BUN: 22 mg/dL (ref 8–23)
CO2: 21 mmol/L — ABNORMAL LOW (ref 22–32)
Calcium: 8.3 mg/dL — ABNORMAL LOW (ref 8.9–10.3)
Chloride: 112 mmol/L — ABNORMAL HIGH (ref 98–111)
Creatinine, Ser: 1.51 mg/dL — ABNORMAL HIGH (ref 0.44–1.00)
GFR calc Af Amer: 40 mL/min — ABNORMAL LOW (ref >60)
GFR calc non Af Amer: 35 mL/min — ABNORMAL LOW (ref >60)
Glucose, Bld: 232 mg/dL — ABNORMAL HIGH (ref 70–99)
Potassium: 4 mmol/L (ref 3.5–5.1)
Sodium: 141 mmol/L (ref 135–145)
Total Bilirubin: 2 mg/dL — ABNORMAL HIGH (ref 0.3–1.2)
Total Protein: 5.9 g/dL — ABNORMAL LOW (ref 6.5–8.1)

## 2019-08-05 LAB — CBC
HCT: 22.5 % — ABNORMAL LOW (ref 36.0–46.0)
Hemoglobin: 6.7 g/dL — CL (ref 12.0–15.0)
MCH: 29.3 pg (ref 26.0–34.0)
MCHC: 29.8 g/dL — ABNORMAL LOW (ref 30.0–36.0)
MCV: 98.3 fL (ref 80.0–100.0)
Platelets: 229 10*3/uL (ref 150–400)
RBC: 2.29 MIL/uL — ABNORMAL LOW (ref 3.87–5.11)
RDW: 19 % — ABNORMAL HIGH (ref 11.5–15.5)
WBC: 5.7 10*3/uL (ref 4.0–10.5)
nRBC: 0 % (ref 0.0–0.2)

## 2019-08-05 LAB — LIPASE, BLOOD: Lipase: 42 U/L (ref 11–51)

## 2019-08-05 LAB — CBG MONITORING, ED: Glucose-Capillary: 146 mg/dL — ABNORMAL HIGH (ref 70–99)

## 2019-08-05 LAB — SARS CORONAVIRUS 2 BY RT PCR (HOSPITAL ORDER, PERFORMED IN ~~LOC~~ HOSPITAL LAB): SARS Coronavirus 2: NEGATIVE

## 2019-08-05 LAB — PREPARE RBC (CROSSMATCH)

## 2019-08-05 MED ORDER — ENOXAPARIN SODIUM 40 MG/0.4ML ~~LOC~~ SOLN
40.0000 mg | SUBCUTANEOUS | Status: DC
  Administered 2019-08-06 – 2019-08-10 (×5): 40 mg via SUBCUTANEOUS
  Filled 2019-08-05 (×5): qty 0.4

## 2019-08-05 MED ORDER — ACETAMINOPHEN 650 MG RE SUPP: 650.0000 mg | Freq: Four times a day (QID) | RECTAL | Status: DC | PRN

## 2019-08-05 MED ORDER — SODIUM CHLORIDE 0.9 % IV SOLN: INTRAVENOUS | Status: AC

## 2019-08-05 MED ORDER — INSULIN ASPART 100 UNIT/ML ~~LOC~~ SOLN
0.0000 [IU] | Freq: Three times a day (TID) | SUBCUTANEOUS | Status: DC
  Administered 2019-08-06: 3 [IU] via SUBCUTANEOUS
  Administered 2019-08-07: 2 [IU] via SUBCUTANEOUS
  Administered 2019-08-07 – 2019-08-08 (×2): 3 [IU] via SUBCUTANEOUS

## 2019-08-05 MED ORDER — ACETAMINOPHEN 325 MG PO TABS
650.0000 mg | ORAL_TABLET | Freq: Four times a day (QID) | ORAL | Status: DC | PRN
  Administered 2019-08-08 – 2019-08-11 (×8): 650 mg via ORAL
  Filled 2019-08-05 (×8): qty 2

## 2019-08-05 MED ORDER — SODIUM CHLORIDE 0.9% IV SOLUTION: Freq: Once | INTRAVENOUS | Status: AC

## 2019-08-05 MED ORDER — INSULIN ASPART 100 UNIT/ML ~~LOC~~ SOLN
0.0000 [IU] | Freq: Every day | SUBCUTANEOUS | Status: DC
  Administered 2019-08-12: 2 [IU] via SUBCUTANEOUS

## 2019-08-05 NOTE — ED Triage Notes (Signed)
Pt in from [facility] via [facility] w/low Hg (5.6). d/c'd from [facility] 5 days ago, and has been receiving Vanc through R PICC for MRSA infection. Reports some abdominal pain and diarrhea since last night, no reported blood in stool. C/o mild sob and weakness

## 2019-08-05 NOTE — ED Provider Notes (Signed)
[facility] EMERGENCY DEPARTMENT Provider Note   CSN: [id number] Arrival date & time: 08/05/19  1259     History Chief Complaint  Patient presents with  . Abnormal Lab  . Abdominal Pain    Heather Petty is a 69 y.o. female.  The history is provided by the patient and medical records. No language interpreter was used.  Abnormal Lab Abdominal Pain  Heather Petty is a 69 y.o. female who presents to the Emergency Department complaining of abnormal lab.  She presents to the ED by EMS from [facility] due to abnormal Hgb on outpatient lab.  She is currently there for recovery following hospitalization for MRSA infection.  She is on PICC line Vanc.  She reports severe diarrhea yesterday and this morning with watery brown stool.  No blood present.  Denies fevers, nausea, vomiting, abdominal pain, chest pain, sob.  Sxs are moderate and constant.      Past Medical History:  Diagnosis Date  . AKI (acute kidney injury) ([place]) 12/17/2017  . Arthritis   . Asthma   . Depression   . Diabetes mellitus   . Gout   . High cholesterol   . Hypertension   . Morbid obesity ([place])   . Pneumonia 12/17/2017  . Schizophrenia ([place])   . Stroke [facility])     Patient Active Problem List   Diagnosis Date Noted  . Anemia 08/05/2019  . Diarrhea 08/05/2019  . DKA (diabetic ketoacidoses) ([place]) 07/22/2019  . Rhabdomyolysis 07/22/2019  . Acute metabolic encephalopathy 62/69/4854  . MRSA bacteremia 07/22/2019  . Enterococcal bacteremia 07/22/2019  . Vitiligo 07/22/2019  . 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 Sepsis ([place]) 07/19/2018  . Ventricular tachycardia, sustained ([place]) 07/19/2018  . NSTEMI (non-ST elevated myocardial infarction) ([place]) 07/19/2018  . Wound infection 04/03/2018  . Morbid obesity with BMI of 50.0-59.9, adult ([place]) 04/03/2018  . Elevated alkaline phosphatase level 04/03/2018  . Asthma exacerbation 03/14/2018  . Hypotension 03/14/2018  . Lactic acidosis 03/14/2018    . GERD (gastroesophageal reflux disease) 03/14/2018  . Acute renal failure superimposed on stage 3 chronic kidney disease ([place]) 03/14/2018  . Macrocytic anemia 03/14/2018  . Abnormal LFTs 03/14/2018  . Acute kidney injury ([place]) 02/28/2018  . Wound of sacral region 02/28/2018  . Hypothermia 02/28/2018  . Stage II pressure ulcer of sacral region ([place]) 02/15/2018  . DM II (diabetes mellitus, type II), controlled ([place]) 02/09/2018  . Schizophrenia ([place]) 02/09/2018  . Pressure ulcer 02/06/2018  . Acute renal failure (ARF) ([place]) 02/05/2018  . Hyperlipidemia associated with type 2 diabetes mellitus ([place]) 02/05/2018  . Hyperkalemia 02/05/2018  . Dermatitis 02/05/2018  . Open back wound 02/05/2018  . Dysuria 02/05/2018  . Paranoid schizophrenia ([place])   . AKI (acute kidney injury) ([place]) 12/17/2017  . Type II diabetes mellitus with renal manifestations ([place]) 12/17/2017  . HTN (hypertension) 12/17/2017  . Gout     Past Surgical History:  Procedure Laterality Date  . CYST EXCISION    . DENTAL SURGERY    . TONSILLECTOMY       OB History   No obstetric history on file.     Family History  Family history unknown: Yes    Social History   Tobacco Use  . Smoking status: Former Smoker    Quit date: 02/06/1974    Years since quitting: 45.5  . Smokeless tobacco: Never Used  Vaping Use  . Vaping Use: Never used  Substance Use Topics  . Alcohol use: No  .  Drug use: No    Home Medications Prior to Admission medications   Medication Sig Start Date End Date Taking? Authorizing Provider  acetaminophen (TYLENOL) 325 MG tablet Take 650 mg by mouth 4 (four) times daily as needed for headache (pain).   Yes [provider]  albuterol (VENTOLIN HFA) 108 (90 Base) MCG/ACT inhaler Inhale 2 puffs into the lungs every 4 (four) hours as needed for wheezing or shortness of breath.   Yes [provider]  allopurinol (ZYLOPRIM) 300 MG tablet Take 300 mg by mouth daily.   Yes  [provider]  amLODipine (NORVASC) 10 MG tablet Take 10 mg by mouth daily.   Yes [provider]  Azelastine-Fluticasone (DYMISTA) 137-50 MCG/ACT SUSP Place 1 spray into both nostrils 2 (two) times daily as needed (for allergies).    Yes [provider]  b complex vitamins tablet Take 1 tablet by mouth daily.   Yes [provider]  benazepril (LOTENSIN) 20 MG tablet Take 20 mg by mouth daily.   Yes [provider]  bisacodyl (DULCOLAX) 5 MG EC tablet Take 1 tablet (5 mg total) by mouth daily as needed for moderate constipation. 12/24/17  Yes Lady Deutscher, MD  colchicine 0.6 MG tablet Take 1 tablet (0.6 mg total) by mouth 2 (two) times daily. 04/05/18  Yes Ghimire, Henreitta Leber, MD  Colloidal Oatmeal (EUCERIN ECZEMA RELIEF) 1 % CREA Apply 1 application topically 2 (two) times daily.   Yes [provider]  diphenhydrAMINE (BENADRYL) 2 % cream Apply 1 application topically 3 (three) times daily as needed for itching.   Yes [provider]  doxepin (SINEQUAN) 25 MG capsule Take 25 mg by mouth at bedtime.    Yes [provider]  famotidine (PEPCID) 20 MG tablet Take 20 mg by mouth 2 (two) times daily.   Yes [provider]  folic acid (FOLVITE) 1 MG tablet Take 1 mg by mouth daily.   Yes [provider]  glipiZIDE (GLUCOTROL XL) 10 MG 24 hr tablet Take 10 mg by mouth 2 (two) times daily.    Yes [provider]  hydrocortisone 2.5 % cream Apply 1 application topically See admin instructions. Apply topically to rash on face twice daily   Yes [provider]  hydrOXYzine (ATARAX/VISTARIL) 25 MG tablet Take 25 mg by mouth 2 (two) times daily.    Yes [provider]  insulin aspart (NOVOLOG) 100 UNIT/ML injection Inject 0-12 Units into the skin See admin instructions. Inject 0-12 units into the skin 4 times a day (before meals and at bedtime) per sliding scale: BGL <60 or >400  = CALL MD;  60-200 = 0 units; 201-250 = 2 units; 251-300 = 4 units; 301-350 = 6 units; 351-400 = 8 units; 401-450 = 10 units; >450 = 12 units 07/31/19  Yes Annita Brod, MD  insulin glargine (LANTUS) 100 UNIT/ML injection Inject 0.25 mLs (25 Units total) into the skin at bedtime. 07/31/19  Yes Annita Brod, MD  ipratropium-albuterol (DUONEB) 0.5-2.5 (3) MG/3ML SOLN Take 3 mLs by nebulization every 6 (six) hours as needed (for shortness of breath or wheezing).    Yes [provider]  Multiple Vitamins-Minerals (THERA-M MULTIPLE VITAMINS PO) Take 1 tablet by mouth daily.   Yes [provider]  nadolol (CORGARD) 20 MG tablet Take 1 tablet (20 mg total) by mouth daily. 07/23/18  Yes Oswald Hillock, MD  nystatin (MYCOSTATIN/NYSTOP) powder Apply 1 g topically See admin instructions. Apply  topically twice daily as needed for wound care  -  between folds, beneath breasts, bilateral axillae, and under pannus   Yes [provider]  polyethylene glycol (MIRALAX / GLYCOLAX) 17 g packet Take 17 g by mouth daily as needed for mild constipation. 07/31/19  Yes Annita Brod, MD  pravastatin (PRAVACHOL) 40 MG tablet Take 40 mg by mouth at bedtime.    Yes [provider]  risperiDONE (RISPERDAL) 0.5 MG tablet Take 1 tablet (0.5 mg total) by mouth 2 (two) times daily. 12/23/17 08/05/19 Yes Purohit, Konrad Dolores, MD  vitamin C (ASCORBIC ACID) 500 MG tablet Take 500 mg by mouth 2 (two) times daily.   Yes [provider]  Vitamin D, Ergocalciferol, (DRISDOL) 1.25 MG (50000 UT) CAPS capsule Take 50,000 Units by mouth every Monday.    Yes [provider]  Zinc Sulfate (ZINC-220 PO) Take 220 mg by mouth at bedtime.    Yes [provider]  collagenase (SANTYL) ointment Apply topically daily. Patient not taking: Reported on 08/05/2019 02/09/18   Elgergawy, Silver Huguenin, MD  vancomycin IVPB Inject 1,000 mg into the vein every 12 (twelve) hours. Indication: MRSA and Enterococcus  Bacteremia First Dose: Yes Last Day of Therapy: 09/01/2019 Labs - Sunday/Monday:  CBC/D, BMP, and vancomycin trough. Labs - Thursday:  BMP and vancomycin trough Labs - Every other week:  ESR and CRP Method of administration:Elastomeric Method of administration may be changed at the discretion of the patient and/or caregiver's ability to self-administer the medication ordered. Patient not taking: Reported on 08/05/2019 07/31/19 09/02/19  Annita Brod, MD    Allergies    Lasix [furosemide]  Review of Systems   Review of Systems  Gastrointestinal: Positive for abdominal pain.  All other systems reviewed and are negative.   Physical Exam Updated Vital Signs BP (!) 111/40   Pulse (!) 58   Temp 97.6 F (36.4 C) (Oral)   Resp (!) 23   Wt 110 kg   SpO2 100%   BMI 45.82 kg/m   Physical Exam Vitals and nursing note reviewed.  Constitutional:      Appearance: She is well-developed.  HENT:     Head: Normocephalic and atraumatic.  Cardiovascular:     Rate and Rhythm: Normal rate and regular rhythm.     Heart sounds: Murmur heard.   Pulmonary:     Effort: Pulmonary effort is normal. No respiratory distress.     Breath sounds: Normal breath sounds.  Abdominal:     Palpations: Abdomen is soft.     Tenderness: There is no abdominal tenderness. There is no guarding or rebound.  Genitourinary:    Comments: Brown stool in rectal vault Musculoskeletal:        General: No tenderness.     Comments: Trace edema to BLE  Skin:    General: Skin is warm and dry.     Coloration: Skin is pale.  Neurological:     Mental Status: She is alert and oriented to person, place, and time.  Psychiatric:        Behavior: Behavior normal.     ED Results / Procedures / Treatments   Labs (all labs ordered are listed, but only abnormal results are displayed) Labs Reviewed  COMPREHENSIVE METABOLIC PANEL - Abnormal; Notable for the following components:      Result Value   Chloride 112 (*)      CO2 21 (*)    Glucose, Bld 232 (*)    Creatinine, Ser 1.51 (*)  Calcium 8.3 (*)    Total Protein 5.9 (*)    Albumin 2.6 (*)    ALT 78 (*)    Alkaline Phosphatase 440 (*)    Total Bilirubin 2.0 (*)    GFR calc non Af Amer 35 (*)    GFR calc Af Amer 40 (*)    All other components within normal limits  CBC - Abnormal; Notable for the following components:   RBC 2.29 (*)    Hemoglobin 6.7 (*)    HCT 22.5 (*)    MCHC 29.8 (*)    RDW 19.0 (*)    All other components within normal limits  CBG MONITORING, ED - Abnormal; Notable for the following components:   Glucose-Capillary 146 (*)    All other components within normal limits  SARS CORONAVIRUS 2 BY RT PCR (HOSPITAL ORDER, [place])  C DIFFICILE QUICK SCREEN W PCR REFLEX  GASTROINTESTINAL PANEL BY PCR, STOOL (REPLACES STOOL CULTURE)  LIPASE, BLOOD  VITAMIN B12  FOLATE  IRON AND TIBC  FERRITIN  RETICULOCYTES  COMPREHENSIVE METABOLIC PANEL  HEMOGLOBIN AND HEMATOCRIT, BLOOD  POC OCCULT BLOOD, ED  POC OCCULT BLOOD, ED  TYPE AND SCREEN  PREPARE RBC (CROSSMATCH)    EKG None  Radiology DG Chest Port 1 View  Result Date: 08/05/2019 CLINICAL DATA:  Shortness of breath EXAM: PORTABLE CHEST 1 VIEW COMPARISON:  07/23/2019 FINDINGS: No focal opacity or pleural effusion. Stable cardiomediastinal silhouette with aortic atherosclerosis. No pneumothorax. Right upper extremity central venous catheter with tip over the SVC. IMPRESSION: No active disease. Electronically Signed   By: Donavan Foil M.D.   On: 08/05/2019 20:28   US Abdomen Limited RUQ  Result Date: 08/05/2019 CLINICAL DATA:  Elevated LFTs EXAM: ULTRASOUND ABDOMEN LIMITED RIGHT UPPER QUADRANT COMPARISON:  CT dated July 21, 2019 FINDINGS: Gallbladder: No gallstones or wall thickening visualized. No sonographic Murphy sign noted by sonographer. Common bile duct: Diameter: 6 mm Liver: Diffuse increased echogenicity with slightly heterogeneous  liver. Appearance typically secondary to fatty infiltration. Fibrosis secondary consideration. No secondary findings of cirrhosis noted. No focal hepatic lesion or intrahepatic biliary duct dilatation. Portal vein is patent on color Doppler imaging with normal direction of blood flow towards the liver. Other: None. IMPRESSION: 1. No acute abnormality. 2. Hepatic steatosis. Electronically Signed   By: Constance Holster M.D.   On: 08/05/2019 22:30    Procedures Procedures (including critical care time) CRITICAL CARE Performed by: Quintella Reichert   Total critical care time: 35 minutes  Critical care time was exclusive of separately billable procedures and treating other patients.  Critical care was necessary to treat or prevent imminent or life-threatening deterioration.  Critical care was time spent personally by me on the following activities: development of treatment plan with patient and/or surrogate as well as nursing, discussions with consultants, evaluation of patient's response to treatment, examination of patient, obtaining history from patient or surrogate, ordering and performing treatments and interventions, ordering and review of laboratory studies, ordering and review of radiographic studies, pulse oximetry and re-evaluation of patient's condition.  Medications Ordered in ED Medications  enoxaparin (LOVENOX) injection 40 mg (has no administration in time range)  acetaminophen (TYLENOL) tablet 650 mg (has no administration in time range)    Or  acetaminophen (TYLENOL) suppository 650 mg (has no administration in time range)  0.9 %  sodium chloride infusion (has no administration in time range)  insulin aspart (novoLOG) injection 0-15 Units (has no administration in time range)  insulin aspart (novoLOG) injection 0-5 Units (0 Units Subcutaneous Not Given 08/05/19 2235)  0.9 %  sodium chloride infusion (Manually program via Guardrails IV Fluids) ( Intravenous New Bag/Given 08/05/19  1952)    ED Course  I have reviewed the triage vital signs and the nursing notes.  Pertinent labs & imaging results that were available during my care of the patient were reviewed by me and considered in my medical decision making (see chart for details).    MDM Rules/Calculators/A&P                          Patient referred to the emergency department for anemia on outpatient labs. She does have decreased in her hemoglobin compared to priors. She has no significant abdominal tenderness on examination, no evidence of G.I. bleeding on exam or based on history. Given her symptomatic anemia plan to transfuse admit for further observation. Patient updated findings of studies recommendation for mission and she is in agreement with treatment plan. Hospitalist consulted for admission. Final Clinical Impression(s) / ED Diagnoses Final diagnoses:  Symptomatic anemia    Rx / DC Orders ED Discharge Orders    None       Quintella Reichert, MD 08/05/19 2353

## 2019-08-05 NOTE — H&P (Signed)
History and Physical    Heather Petty TGG:[id number] DOB: Aug 15, 1950 DOA: 08/05/2019  PCP: [name], MD Patient coming from: Heather Petty  Chief Complaint: Low hemoglobin  HPI: Heather Petty is a 69 y.o. female with medical history significant of asthma, depression, insulin-dependent type 2 diabetes, gout, hypertension, hyperlipidemia, morbid obesity (BMI 45.82), schizophrenia, CVA presenting from [facility] for evaluation of low hemoglobin (5.6), diarrhea, abdominal pain, and shortness of breath.  Patient was recently admitted to the hospital 6/14-6/24 for sepsis secondary to MRSA and enterococcal bacteremia.  Source felt to be secondary to UTI and infected sacral decubitus ulcer.  Urine culture grew Klebsiella.  No valvular vegetations on TTE.  Infectious disease was consulted and recommended TEE which the patient refused.  PICC line was placed on 6/22 and she was discharged with plan to continue IV vancomycin for total of 6 weeks until 7/26.  Patient states she was sent here from [facility] because her hemoglobin was low.  She has had nonbloody diarrhea for the past 2 days.  No nausea or vomiting.  She had some mild lower abdominal discomfort earlier which has now resolved.  Denies chest pain or shortness of breath.  No other complaints.  ED Course: Afebrile.  Not tachycardic or hypotensive.  Labs showing no leukocytosis.  Hemoglobin 6.7, was 8.4 at the time of recent hospital discharge.  No grossly bloody stool seen on rectal exam and FOBT negative.  BUN 22, creatinine 1.5.  Baseline creatinine 0.6-0.7.  Alk phos 440, T bili 2.2, ALT 78, AST 36.  Lipase normal.  C. difficile PCR pending.  SARS-CoV-2 PCR test pending.  Chest x-ray showing no active disease.  Review of Systems:  All systems reviewed and apart from history of presenting illness, are negative.  Past Medical History:  Diagnosis Date  . AKI (acute kidney injury) ([place]) 12/17/2017  . Arthritis   . Asthma   .  Depression   . Diabetes mellitus   . Gout   . High cholesterol   . Hypertension   . Morbid obesity ([place])   . Pneumonia 12/17/2017  . Schizophrenia ([place])   . Stroke [facility])     Past Surgical History:  Procedure Laterality Date  . CYST EXCISION    . DENTAL SURGERY    . TONSILLECTOMY       reports that she quit smoking about 45 years ago. She has never used smokeless tobacco. She reports that she does not drink alcohol and does not use drugs.  Allergies  Allergen Reactions  . Lasix [Furosemide] Other (See Comments)    IV-LASIX ==> Reaction: Burning   Family history: No pertinent family history.  Prior to Admission medications   Medication Sig Start Date End Date Taking? Authorizing Provider  acetaminophen (TYLENOL) 325 MG tablet Take 650 mg by mouth 4 (four) times daily as needed for headache (pain).    [provider]  albuterol (VENTOLIN HFA) 108 (90 Base) MCG/ACT inhaler Inhale 2 puffs into the lungs every 4 (four) hours as needed for wheezing or shortness of breath.    [provider]  allopurinol (ZYLOPRIM) 300 MG tablet Take 300 mg by mouth daily.    [provider]  amLODipine (NORVASC) 10 MG tablet Take 10 mg by mouth daily.    [provider]  Azelastine-Fluticasone (DYMISTA) 137-50 MCG/ACT SUSP Place 1 spray into both nostrils 2 (two) times daily as needed (for allergies).     [provider]  b complex vitamins tablet Take 1 tablet  by mouth daily.    [provider]  benazepril (LOTENSIN) 20 MG tablet Take 20 mg by mouth daily.    [provider]  bisacodyl (DULCOLAX) 5 MG EC tablet Take 1 tablet (5 mg total) by mouth daily as needed for moderate constipation. 12/24/17   Lady Deutscher, MD  colchicine 0.6 MG tablet Take 1 tablet (0.6 mg total) by mouth 2 (two) times daily. 04/05/18   Ghimire, Henreitta Leber, MD  collagenase (SANTYL) ointment Apply topically daily. Patient taking differently: Apply 1 application  topically See admin instructions. Clean with Dakin's Solution (half strength), apply skin prep periwound. Apply Santyl Ointment to necrotic tissue in wound bed, cover with Calcium Alginate, cover with bordered foam dressing daily. 02/09/18   Elgergawy, Silver Huguenin, MD  Colloidal Oatmeal (EUCERIN ECZEMA RELIEF) 1 % CREA Apply 1 application topically 2 (two) times daily.    [provider]  diphenhydrAMINE (BENADRYL) 2 % cream Apply 1 application topically 3 (three) times daily as needed for itching.    [provider]  doxepin (SINEQUAN) 25 MG capsule Take 25 mg by mouth at bedtime.     [provider]  famotidine (PEPCID) 20 MG tablet Take 20 mg by mouth 2 (two) times daily.    [provider]  folic acid (FOLVITE) 1 MG tablet Take 1 mg by mouth daily.    [provider]  glipiZIDE (GLUCOTROL XL) 10 MG 24 hr tablet Take 10 mg by mouth 2 (two) times daily.     [provider]  hydrocortisone 2.5 % cream Apply 1 application topically See admin instructions. Apply topically to rash on face twice daily    [provider]  hydrOXYzine (ATARAX/VISTARIL) 25 MG tablet Take 25 mg by mouth 2 (two) times daily as needed for itching.    [provider]  insulin aspart (NOVOLOG) 100 UNIT/ML injection Inject 0-12 Units into the skin See admin instructions. Inject 0-12 units into the skin 4 times a day (before meals and at bedtime) per sliding scale: BGL <60 or >400  = CALL MD; 60-200 = 0 units; 201-250 = 2 units; 251-300 = 4 units; 301-350 = 6 units; 351-400 = 8 units; 401-450 = 10 units; >450 = 12 units 07/31/19   Annita Brod, MD  insulin glargine (LANTUS) 100 UNIT/ML injection Inject 0.25 mLs (25 Units total) into the skin at bedtime. 07/31/19   Annita Brod, MD  ipratropium-albuterol (DUONEB) 0.5-2.5 (3) MG/3ML SOLN Take 3 mLs by nebulization every 6 (six) hours as needed (for shortness of breath or wheezing).     [provider]    Multiple Vitamins-Minerals (THERA-M MULTIPLE VITAMINS PO) Take 1 tablet by mouth daily.    [provider]  nadolol (CORGARD) 20 MG tablet Take 1 tablet (20 mg total) by mouth daily. 07/23/18   Oswald Hillock, MD  nystatin (MYCOSTATIN/NYSTOP) powder Apply 1 g topically See admin instructions. Apply topically twice daily as needed for wound care  -  between folds, beneath breasts, bilateral axillae, and under pannus    [provider]  polyethylene glycol (MIRALAX / GLYCOLAX) 17 g packet Take 17 g by mouth daily as needed for mild constipation. 07/31/19   Annita Brod, MD  pravastatin (PRAVACHOL) 40 MG tablet Take 40 mg by mouth at bedtime.     [provider]  risperiDONE (RISPERDAL) 0.5 MG tablet Take 1 tablet (0.5 mg total) by mouth 2 (two) times daily. 12/23/17 07/24/19  Purohit, Brigid Re  C, MD  vancomycin IVPB Inject 1,000 mg into the vein every 12 (twelve) hours. Indication: MRSA and Enterococcus Bacteremia First Dose: Yes Last Day of Therapy: 09/01/2019 Labs - Sunday/Monday:  CBC/D, BMP, and vancomycin trough. Labs - Thursday:  BMP and vancomycin trough Labs - Every other week:  ESR and CRP Method of administration:Elastomeric Method of administration may be changed at the discretion of the patient and/or caregiver's ability to self-administer the medication ordered. 07/31/19 09/02/19  Annita Brod, MD  vitamin C (ASCORBIC ACID) 500 MG tablet Take 500 mg by mouth 2 (two) times daily.    [provider]  Vitamin D, Ergocalciferol, (DRISDOL) 1.25 MG (50000 UT) CAPS capsule Take 50,000 Units by mouth every Monday.     [provider]  Zinc Sulfate (ZINC-220 PO) Take 220 mg by mouth at bedtime.     [provider]    Physical Exam: Vitals:   08/05/19 2015 08/05/19 2030 08/05/19 2045 08/05/19 2100  BP: (!) 113/51 (!) 105/53 (!) 116/55 (!) 109/54  Pulse: (!) 55 (!) 59 (!) 55 (!) 52  Resp: 17 (!) 23 18 18   Temp:      TempSrc:       SpO2: 100% 100% 100% 100%  Weight:        Physical Exam Constitutional:      General: She is not in acute distress. HENT:     Head: Normocephalic.     Mouth/Throat:     Mouth: Mucous membranes are dry.  Eyes:     Extraocular Movements: Extraocular movements intact.  Cardiovascular:     Rate and Rhythm: Normal rate and regular rhythm.     Pulses: Normal pulses.  Pulmonary:     Effort: Pulmonary effort is normal. No respiratory distress.     Breath sounds: Normal breath sounds. No wheezing or rales.  Abdominal:     General: Bowel sounds are normal. There is no distension.     Palpations: Abdomen is soft.     Tenderness: There is no abdominal tenderness. There is no guarding or rebound.  Musculoskeletal:        General: No swelling.     Cervical back: Neck supple.  Skin:    General: Skin is warm and dry.  Neurological:     Mental Status: She is alert and oriented to person, place, and time.     Labs on Admission: I have personally reviewed following labs and imaging studies  CBC: Recent Labs  Lab 08/05/19 1329  WBC 5.7  HGB 6.7*  HCT 22.5*  MCV 98.3  PLT 175   Basic Metabolic Panel: Recent Labs  Lab 07/31/19 0527 08/05/19 1329  NA 135 141  K 2.9* 4.0  CL 101 112*  CO2 27 21*  GLUCOSE 163* 232*  BUN 9 22  CREATININE 0.78 1.51*  CALCIUM 7.7* 8.3*  MG 1.8  --    GFR: Estimated Creatinine Clearance: 39.8 mL/min (A) (by C-G formula based on SCr of 1.51 mg/dL (H)). Liver Function Tests: Recent Labs  Lab 08/05/19 1329  AST 36  ALT 78*  ALKPHOS 440*  BILITOT 2.0*  PROT 5.9*  ALBUMIN 2.6*   Recent Labs  Lab 08/05/19 1329  LIPASE 42   No results for input(s): AMMONIA in the last 168 hours. Coagulation Profile: No results for input(s): INR, PROTIME in the last 168 hours. Cardiac Enzymes: No results for input(s): CKTOTAL, CKMB, CKMBINDEX, TROPONINI in the last 168 hours. BNP (last 3 results) No results for input(s):  PROBNP in the last 8760  hours. HbA1C: No results for input(s): HGBA1C in the last 72 hours. CBG: Recent Labs  Lab 07/30/19 2044 07/31/19 0732 07/31/19 1213 07/31/19 1627 07/31/19 2045  GLUCAP 176* 149* 176* 258* 168*   Lipid Profile: No results for input(s): CHOL, HDL, LDLCALC, TRIG, CHOLHDL, LDLDIRECT in the last 72 hours. Thyroid Function Tests: No results for input(s): TSH, T4TOTAL, FREET4, T3FREE, THYROIDAB in the last 72 hours. Anemia Panel: No results for input(s): VITAMINB12, FOLATE, FERRITIN, TIBC, IRON, RETICCTPCT in the last 72 hours. Urine analysis:    Component Value Date/Time   COLORURINE AMBER (A) 07/28/2019 1236   APPEARANCEUR CLEAR 07/28/2019 1236   LABSPEC 1.014 07/28/2019 1236   PHURINE 7.0 07/28/2019 1236   GLUCOSEU NEGATIVE 07/28/2019 1236   HGBUR NEGATIVE 07/28/2019 1236   BILIRUBINUR NEGATIVE 07/28/2019 1236   KETONESUR NEGATIVE 07/28/2019 1236   PROTEINUR 30 (A) 07/28/2019 1236   UROBILINOGEN 0.2 12/27/2010 1858   NITRITE NEGATIVE 07/28/2019 1236   LEUKOCYTESUR NEGATIVE 07/28/2019 1236    Radiological Exams on Admission: DG Chest Port 1 View  Result Date: 08/05/2019 CLINICAL DATA:  Shortness of breath EXAM: PORTABLE CHEST 1 VIEW COMPARISON:  07/23/2019 FINDINGS: No focal opacity or pleural effusion. Stable cardiomediastinal silhouette with aortic atherosclerosis. No pneumothorax. Right upper extremity central venous catheter with tip over the SVC. IMPRESSION: No active disease. Electronically Signed   By: Donavan Foil M.D.   On: 08/05/2019 20:28    EKG: Independently reviewed.  Sinus bradycardia, heart rate 58.  Low voltage.  Assessment/Plan Principal Problem:   Diarrhea Active Problems:   HTN (hypertension)   Stage II pressure ulcer of sacral region ([place])   MRSA bacteremia   Anemia   Diarrhea: Patient is currently on vancomycin after being recently diagnosed with MRSA and enterococcal bacteremia.  Abdominal exam benign. No fever or leukocytosis. ?C diff  colitis. -C. difficile PCR, GI pathogen panel, enteric precautions  ?Dyspnea: Per triage note, shortness of breath was reported.  Patient denies any shortness of breath. Lungs clear on exam and chest x-ray showing no active disease. -Continue to monitor  Recent MRSA and enterococcal bacteremia: TTE did not reveal valvular vegetations.  ID had recommended TEE which the patient refused.  Blood cultures drawn 6/18 showing no growth.  Patient has a PICC line and plan is to continue IV vancomycin for a total of 6 weeks until 7/26. -C. difficile PCR pending, hold vancomycin at this time  Acute on chronic anemia: Hemoglobin 6.7, was 8.4 at the time of recent hospital discharge.  Previously ranging between 10-13.  No grossly bloody stool seen on rectal exam and FOBT negative. -1 unit PRBCs ordered in the ED, follow-up posttransfusion H&H.  Order anemia panel.  Sinus bradycardia: Bradycardic with heart rate as low as 50.  Not hypotensive.  Does take a beta-blocker at home. -Cardiac monitoring, hold beta-blocker at this time  AKI: Creatinine 1.5, baseline 0.6-0.7.  Likely prerenal due to dehydration in the setting of diarrhea.  ACE inhibitor use also likely contributing. -IV fluid hydration.  Monitor renal function and urine output.  Avoid nephrotoxic agents.  Hold ACE inhibitor.  Elevated LFTs: Alk phos 440, T bili 2.2, ALT 78, AST 36.  Lipase normal.  Abdominal exam benign. -Right upper quadrant ultrasound  Poorly controlled insulin-dependent type 2 diabetes: A1c 10.4 on 07/22/2019.  Random blood glucose 232. -Order sliding scale insulin moderate ACHS. Resume home basal insulin after pharmacy med rec is done.  Hold glipizide.  Stage II  sacral decubitus ulcer -Wound care  Hypertension: Blood pressure slightly soft. -Give IV fluid hydration and hold antihypertensives at this time.  Hyperlipidemia -Resume home statin after pharmacy med rec is done.  DVT prophylaxis: Lovenox Code Status: Full  code-discussed with the patient Family Communication: No family available at this time. Disposition Plan: Status is: Inpatient  Remains inpatient appropriate because:IV treatments appropriate due to intensity of illness or inability to take PO and Inpatient level of care appropriate due to severity of illness   Dispo: The patient is from: SNF              Anticipated d/c is to: SNF              Anticipated d/c date is: 3 days              Patient currently is not medically stable to d/c.  The medical decision making on this patient was of high complexity and the patient is at high risk for clinical deterioration, therefore this is a level 3 visit.  Shela Leff MD [facility]  If 7PM-7AM, please contact night-coverage [email or web address]  08/05/2019, 9:20 PM

## 2019-08-06 DIAGNOSIS — I10 Essential (primary) hypertension: Secondary | ICD-10-CM

## 2019-08-06 DIAGNOSIS — B9562 Methicillin resistant Staphylococcus aureus infection as the cause of diseases classified elsewhere: Secondary | ICD-10-CM

## 2019-08-06 DIAGNOSIS — R7881 Bacteremia: Secondary | ICD-10-CM

## 2019-08-06 DIAGNOSIS — R7989 Other specified abnormal findings of blood chemistry: Secondary | ICD-10-CM

## 2019-08-06 DIAGNOSIS — L89152 Pressure ulcer of sacral region, stage 2: Secondary | ICD-10-CM

## 2019-08-06 DIAGNOSIS — D649 Anemia, unspecified: Principal | ICD-10-CM

## 2019-08-06 LAB — COMPREHENSIVE METABOLIC PANEL
ALT: 66 U/L — ABNORMAL HIGH (ref 0–44)
AST: 24 U/L (ref 15–41)
Albumin: 2.5 g/dL — ABNORMAL LOW (ref 3.5–5.0)
Alkaline Phosphatase: 370 U/L — ABNORMAL HIGH (ref 38–126)
Anion gap: 6 (ref 5–15)
BUN: 23 mg/dL (ref 8–23)
CO2: 21 mmol/L — ABNORMAL LOW (ref 22–32)
Calcium: 8.4 mg/dL — ABNORMAL LOW (ref 8.9–10.3)
Chloride: 113 mmol/L — ABNORMAL HIGH (ref 98–111)
Creatinine, Ser: 1.58 mg/dL — ABNORMAL HIGH (ref 0.44–1.00)
GFR calc Af Amer: 38 mL/min — ABNORMAL LOW (ref >60)
GFR calc non Af Amer: 33 mL/min — ABNORMAL LOW (ref >60)
Glucose, Bld: 225 mg/dL — ABNORMAL HIGH (ref 70–99)
Potassium: 4 mmol/L (ref 3.5–5.1)
Sodium: 140 mmol/L (ref 135–145)
Total Bilirubin: 2.1 mg/dL — ABNORMAL HIGH (ref 0.3–1.2)
Total Protein: 5.7 g/dL — ABNORMAL LOW (ref 6.5–8.1)

## 2019-08-06 LAB — RETICULOCYTES
Immature Retic Fract: 23.9 % — ABNORMAL HIGH (ref 2.3–15.9)
RBC.: 2.59 MIL/uL — ABNORMAL LOW (ref 3.87–5.11)
Retic Count, Absolute: 44.8 10*3/uL (ref 19.0–186.0)
Retic Ct Pct: 1.7 % (ref 0.4–3.1)

## 2019-08-06 LAB — IRON AND TIBC
Iron: 63 ug/dL (ref 28–170)
Saturation Ratios: 19 % (ref 10.4–31.8)
TIBC: 326 ug/dL (ref 250–450)
UIBC: 263 ug/dL

## 2019-08-06 LAB — HEMOGLOBIN AND HEMATOCRIT, BLOOD
HCT: 24.4 % — ABNORMAL LOW (ref 36.0–46.0)
Hemoglobin: 7.4 g/dL — ABNORMAL LOW (ref 12.0–15.0)

## 2019-08-06 LAB — GLUCOSE, CAPILLARY
Glucose-Capillary: 112 mg/dL — ABNORMAL HIGH (ref 70–99)
Glucose-Capillary: 113 mg/dL — ABNORMAL HIGH (ref 70–99)
Glucose-Capillary: 173 mg/dL — ABNORMAL HIGH (ref 70–99)
Glucose-Capillary: 165 mg/dL — ABNORMAL HIGH (ref 70–99)

## 2019-08-06 LAB — LACTATE DEHYDROGENASE: LDH: 200 U/L — ABNORMAL HIGH (ref 98–192)

## 2019-08-06 LAB — FOLATE: Folate: 16.7 ng/mL (ref >5.9)

## 2019-08-06 LAB — FERRITIN: Ferritin: 1095 ng/mL — ABNORMAL HIGH (ref 11–307)

## 2019-08-06 LAB — VITAMIN B12: Vitamin B-12: 1281 pg/mL — ABNORMAL HIGH (ref 180–914)

## 2019-08-06 LAB — PREPARE RBC (CROSSMATCH)

## 2019-08-06 LAB — VANCOMYCIN, RANDOM: Vancomycin Rm: 19

## 2019-08-06 MED ORDER — SODIUM CHLORIDE 0.9% IV SOLUTION: Freq: Once | INTRAVENOUS | Status: DC

## 2019-08-06 MED ORDER — ALLOPURINOL 100 MG PO TABS
300.0000 mg | ORAL_TABLET | Freq: Every day | ORAL | Status: DC
  Administered 2019-08-06 – 2019-08-13 (×8): 300 mg via ORAL
  Filled 2019-08-06 (×9): qty 3

## 2019-08-06 MED ORDER — ASCORBIC ACID 500 MG PO TABS
500.0000 mg | ORAL_TABLET | Freq: Two times a day (BID) | ORAL | Status: DC
  Administered 2019-08-06 – 2019-08-13 (×15): 500 mg via ORAL
  Filled 2019-08-06 (×16): qty 1

## 2019-08-06 MED ORDER — DOXEPIN HCL 25 MG PO CAPS
25.0000 mg | ORAL_CAPSULE | Freq: Every day | ORAL | Status: DC
  Administered 2019-08-06 – 2019-08-07 (×2): 25 mg via ORAL
  Filled 2019-08-06 (×3): qty 1

## 2019-08-06 MED ORDER — FOLIC ACID 1 MG PO TABS
1.0000 mg | ORAL_TABLET | Freq: Every day | ORAL | Status: DC
  Administered 2019-08-06 – 2019-08-13 (×8): 1 mg via ORAL
  Filled 2019-08-06 (×8): qty 1

## 2019-08-06 MED ORDER — BISACODYL 5 MG PO TBEC: 5.0000 mg | DELAYED_RELEASE_TABLET | Freq: Every day | ORAL | Status: DC | PRN

## 2019-08-06 MED ORDER — PRAVASTATIN SODIUM 40 MG PO TABS: 40.0000 mg | ORAL_TABLET | Freq: Every day | ORAL | Status: DC

## 2019-08-06 MED ORDER — AZELASTINE HCL 0.1 % NA SOLN: 1.0000 | Freq: Two times a day (BID) | NASAL | Status: DC | PRN

## 2019-08-06 MED ORDER — NADOLOL 20 MG PO TABS: 20.0000 mg | ORAL_TABLET | Freq: Every day | ORAL | Status: DC

## 2019-08-06 MED ORDER — AZELASTINE-FLUTICASONE 137-50 MCG/ACT NA SUSP: 1.0000 | Freq: Two times a day (BID) | NASAL | Status: DC | PRN

## 2019-08-06 MED ORDER — INSULIN GLARGINE 100 UNIT/ML ~~LOC~~ SOLN
20.0000 [IU] | Freq: Every day | SUBCUTANEOUS | Status: DC
  Administered 2019-08-06 – 2019-08-08 (×3): 20 [IU] via SUBCUTANEOUS
  Filled 2019-08-06 (×4): qty 0.2

## 2019-08-06 MED ORDER — FAMOTIDINE 20 MG PO TABS
20.0000 mg | ORAL_TABLET | Freq: Two times a day (BID) | ORAL | Status: DC
  Administered 2019-08-06 – 2019-08-08 (×5): 20 mg via ORAL
  Filled 2019-08-06 (×6): qty 1

## 2019-08-06 MED ORDER — DIPHENHYDRAMINE-ZINC ACETATE 2-0.1 % EX CREA
1.0000 "application " | TOPICAL_CREAM | Freq: Three times a day (TID) | CUTANEOUS | Status: DC | PRN
  Administered 2019-08-07 (×2): 1 via TOPICAL
  Filled 2019-08-06: qty 28

## 2019-08-06 MED ORDER — FLUTICASONE PROPIONATE 50 MCG/ACT NA SUSP: 1.0000 | Freq: Two times a day (BID) | NASAL | Status: DC | PRN

## 2019-08-06 MED ORDER — VANCOMYCIN HCL 1250 MG/250ML IV SOLN
1250.0000 mg | Freq: Once | INTRAVENOUS | Status: AC
  Administered 2019-08-06: 1250 mg via INTRAVENOUS
  Filled 2019-08-06 (×2): qty 250

## 2019-08-06 MED ORDER — B COMPLEX-C PO TABS
1.0000 | ORAL_TABLET | Freq: Every day | ORAL | Status: DC
  Administered 2019-08-06 – 2019-08-13 (×8): 1 via ORAL
  Filled 2019-08-06 (×9): qty 1

## 2019-08-06 MED ORDER — CHLORHEXIDINE GLUCONATE CLOTH 2 % EX PADS
6.0000 | MEDICATED_PAD | Freq: Every day | CUTANEOUS | Status: DC
  Administered 2019-08-06 – 2019-08-12 (×7): 6 via TOPICAL

## 2019-08-06 NOTE — Plan of Care (Signed)
Pt. Alert and oriented. Pt on 2L. Skin is discolored and peeling. Purewick in place. Problem: Education: Goal: Knowledge of General Education information will improve Description: Including pain rating scale, medication(s)/side effects and non-pharmacologic comfort measures Outcome: Progressing   Problem: Health Behavior/Discharge Planning: Goal: Ability to manage health-related needs will improve Outcome: Progressing   Problem: Clinical Measurements: Goal: Ability to maintain clinical measurements within normal limits will improve Outcome: Progressing Goal: Will remain free from infection Outcome: Progressing Goal: Diagnostic test results will improve Outcome: Progressing Goal: Respiratory complications will improve Outcome: Progressing Goal: Cardiovascular complication will be avoided Outcome: Progressing   Problem: Activity: Goal: Risk for activity intolerance will decrease Outcome: Progressing   Problem: Nutrition: Goal: Adequate nutrition will be maintained Outcome: Progressing   Problem: Coping: Goal: Level of anxiety will decrease Outcome: Progressing   Problem: Elimination: Goal: Will not experience complications related to bowel motility Outcome: Progressing Goal: Will not experience complications related to urinary retention Outcome: Progressing   Problem: Pain Managment: Goal: General experience of comfort will improve Outcome: Progressing   Problem: Safety: Goal: Ability to remain free from injury will improve Outcome: Progressing   Problem: Skin Integrity: Goal: Risk for impaired skin integrity will decrease Outcome: Progressing

## 2019-08-06 NOTE — Progress Notes (Addendum)
Pharmacy Antibiotic Note  Heather Petty is a 69 y.o. female admitted on 08/05/2019 with Low Hgb.  Recent hospital admission for DKA, 07/21/19- 08/01/19, discharged 08/01/19 to SNF on Vancomycin 1gm IV q12hr  x 6 wks, end date 7/26/21or MRSA and Enterococcal Bacteremia. She is readmitted 6/29 AM due to low Hgb,  diarrhea, abdominal pain, and shortness of breath.    Pharmacy has been consulted to resume Vancomycin dosing for recent MRSA and enterococcal bacteremia, for 6 weeks total, end date 09/01/19.    PTA dose Vanc 1g IV q12h to continue for 6 weeks, end date 09/01/19  (per SNF MAR last  given pta on 6/27 @22 :27)   VT 6/23 = 20 on 1g q12h (Scr stable at 0.7 on 6/23)- previous admission.  This admit 6/29 , AKI with SCR 1.51 > 1.58 ,baseline 0.6-0.7. No vanc given yet on this admission.  Checking random vancomycin level. Estimated CrCl is 38 ml/min WBC 5.7, afebrile.  Repeat Blood cultures to 6/18 showed no growth. Diarrhea =- ? C diff colitis, -C. difficile PCR, GI pathogen panel  6/30 VR = 19   (per snf MAR, LD given 6/27 2230, thus none on 6/28, 6/29)  Goal Vancomycin trough 15-20 mcg/ml  Plan: Vancomycin 1250 mg IV x1 today then check VR in 24 hours. Vanc to continue thru 09/01/19 (6weeks total) Goal Vancomycin trough 15-20 mcg/ml Monitor clinical progress, renal function. Check steady state vancomycin trough per protocol if needed.     Weight: 110 kg (242 lb 8.1 oz)  Temp (24hrs), Avg:98 F (36.7 C), Min:97.6 F (36.4 C), Max:98.6 F (37 C)  Recent Labs  Lab 07/30/19 1331 07/31/19 0527 08/05/19 1329 08/06/19 0054 08/06/19 0906  WBC  --   --  5.7  --   --   CREATININE  --  0.78 1.51* 1.58*  --   VANCOTROUGH 20  --   --   --   --   VANCORANDOM  --   --   --   --  19    Estimated Creatinine Clearance: 38 mL/min (A) (by C-G formula based on SCr of 1.58 mg/dL (H)).    Allergies  Allergen Reactions  . Lasix [Furosemide] Other (See Comments)    IV-LASIX ==> Reaction:  Burning    Antimicrobials this admission: Vancomycin started on previous admission 6/14>>   (per snf MAR, LD given 6/27 2230, thus none on 6/28, 6/29) Planned End date 09/01/19  6/14 BCID: MRSA, Enterococcus, staph hominis 6/14 Ucx: 80,000 Klebsiella 6/14 Bcx: 2/2 MRSA per BCID  6/15 Bcx - 2/2 MRSA 6/17 Bcx: MRSA 6/18 Bcx: NGTD 6/21 Ucx: NGTD   Dose adjustments this admission: 6/30 VR = 19   (per snf MAR, LD given 6/27 2230, thus none on 6/28, 6/29)   Microbiology results: 6/29 Covid 19: neg  Prev admission micro:  6/14 BCID: MRSA, Enterococcus, staph hominis 6/14 Ucx: 80,000 Klebsiella 6/14 Bcx: 2/2 MRSA per BCID  6/15 Bcx - 2/2 MRSA 6/17 Bcx: MRSA 6/18 Bcx: Negative 6/21 Ucx: Negative     Thank you for allowing pharmacy to be a part of this patient's care. Nicole Cella, RPh Clinical Pharmacist Please check AMION for all [place] phone numbers After 10:00 PM, call [place] [phone]   08/06/2019 12:06 PM

## 2019-08-06 NOTE — Consult Note (Signed)
WOC Nurse Consult Note: Reason for Consult: "sacral decubitus ulcer" Wound type:none Pressure Injury POA: NA Measurement:none Wound [id number] Drainage (amount, consistency, odor) none Periwound: evidence of previous pressure and moisture injury; however skin is intact except for pin point area on the right buttock  Dressing procedure/placement/frequency: prophylactic sacral foam to protect vunerable skin  Management of moisture with external female urine collector if patient can not stand/use BSC.   Discussed POC with patient and bedside nurse.  Re consult if needed, will not follow at this time. Thanks  Darrien Laakso [employer], RN,CWOCN, CNS, [place] [phone])

## 2019-08-06 NOTE — Evaluation (Signed)
Physical Therapy Evaluation Patient Details Name: Heather Petty MRN: [id number] DOB: 02-18-1950 Today's Date: 08/06/2019   History of Present Illness  Pt is a 69 y.o. F with hx HTN, IDDM, obesity, hx CVA, schizophrenia/depression and asthma who presents from [facility] on 6/29 for diarrhea and decreased Hgb values. Recent admission for AMS, sepsis with d/c 6/24.  Clinical Impression   Pt presents with LE weakness, R knee pain, good sitting balance, difficulty performing bed mobility, inability to perform transfers/gait training today due to fatigue, and decreased activity tolerance. Pt to benefit from acute PT to address deficits. Pt required min assist for bed mobility and positioning at EOB, pt declining further mobility at this point. PT recommending SNF level of care post-acutely. PT to progress mobility as tolerated, and will continue to follow acutely.      Follow Up Recommendations SNF;Supervision/Assistance - 24 hour    Equipment Recommendations  None recommended by PT    Recommendations for Other Services       Precautions / Restrictions Precautions Precautions: Fall Restrictions Weight Bearing Restrictions: No      Mobility  Bed Mobility Overal bed mobility: Needs Assistance Bed Mobility: Supine to Sit;Sit to Supine;Rolling Rolling: Min guard   Supine to sit: Min assist;HOB elevated Sit to supine: Min assist   General bed mobility comments: Min guard to roll towards R for adjustment of bed pads, min assist for supine<>sit for trunk and LE management as well as scooting up in bed with use of bed pads and flat HOB upon return to supine.  Transfers                 General transfer comment: pt declined - states too fatigued  Ambulation/Gait             General Gait Details: pt declined - states too fatigued  Stairs            Wheelchair Mobility    Modified Rankin (Stroke Patients Only)       Balance Overall balance assessment:  Needs assistance Sitting-balance support: Feet unsupported;Single extremity supported Sitting balance-Leahy Scale: Good Sitting balance - Comments: able to sit EOB multiple minutes without PT assist       Standing balance comment: unable to assess this day                             Pertinent Vitals/Pain Pain Assessment: Faces Faces Pain Scale: Hurts little more Pain Location: R knee (arthritic) Pain Descriptors / Indicators: Grimacing;Guarding Pain Intervention(s): Limited activity within patient's tolerance;Monitored during session;Repositioned    Home Living Family/patient expects to be discharged to:: Skilled nursing facility Living Arrangements: Alone Available Help at Discharge: Family;Available PRN/intermittently Type of Home: House Home Access: Ramped entrance     Home Layout: One level Home Equipment: Tub bench;Walker - 4 wheels;Cane - single point;Wheelchair - Regulatory affairs officer / Transfers Assistance Needed: At SNF, pt ambulating short distance with use of RW  ADL's / Homemaking Assistance Needed: Pt reports assist with ADLs at SNF, states she took a shower 2 days ago with tech help        Hand Dominance   Dominant Hand: Right    Extremity/Trunk Assessment   Upper Extremity Assessment Upper Extremity Assessment: Generalized weakness    Lower Extremity Assessment Lower Extremity Assessment: Generalized weakness RLE Deficits / Details: able to perform heel slides in hip  abd position secondary to hip flexor/adductor weakness, SLR with increased effort. painful R knee during ROM secondary to arthritic changes per pt LLE Deficits / Details: see above    Cervical / Trunk Assessment Cervical / Trunk Assessment: Normal  Communication   Communication: No difficulties (Pt does not require Patent attorney, is fluent in [place])  Cognition Arousal/Alertness: Awake/alert Behavior During Therapy: Flat affect Overall Cognitive  Status: No family/caregiver present to determine baseline cognitive functioning Area of Impairment: Following commands;Safety/judgement;Problem solving                       Following Commands: Follows one step commands with increased time Safety/Judgement: Decreased awareness of deficits;Decreased awareness of safety   Problem Solving: Requires verbal cues;Requires tactile cues General Comments: Pt requires mod verbal encouragement to participate in mobility, and very increased time.      General Comments General comments (skin integrity, edema, etc.): on 2LO2 via [place] during session, VSS    Exercises     Assessment/Plan    PT Assessment Patient needs continued PT services  PT Problem List Decreased strength;Decreased safety awareness;Decreased mobility;Decreased range of motion;Decreased knowledge of precautions;Decreased activity tolerance;Decreased cognition;Decreased knowledge of use of DME;Decreased balance;Pain       PT Treatment Interventions DME instruction;Therapeutic activities;Gait training;Therapeutic exercise;Patient/family education;Balance training;Functional mobility training    PT Goals (Current goals can be found in the Care Plan section)  Acute Rehab PT Goals Patient Stated Goal: back to rehab PT Goal Formulation: With patient Time For Goal Achievement: 08/20/19 Potential to Achieve Goals: Good    Frequency Min 2X/week   Barriers to discharge        Co-evaluation               AM-PAC PT "6 Clicks" Mobility  Outcome Measure Help needed turning from your back to your side while in a flat bed without using bedrails?: A Little Help needed moving from lying on your back to sitting on the side of a flat bed without using bedrails?: A Little Help needed moving to and from a bed to a chair (including a wheelchair)?: A Lot Help needed standing up from a chair using your arms (e.g., wheelchair or bedside chair)?: A Lot Help needed to walk in  hospital room?: A Lot Help needed climbing 3-5 steps with a railing? : Total 6 Click Score: 13    End of Session Equipment Utilized During Treatment: Oxygen Activity Tolerance: Other (comment) (self limiting) Patient left: with call bell/phone within reach;in bed;with bed alarm set Nurse Communication: Mobility status PT Visit Diagnosis: Unsteadiness on feet (R26.81);Muscle weakness (generalized) (M62.81)    Time: 1224-8250 PT Time Calculation (min) (ACUTE ONLY): 18 min   Charges:   PT Evaluation $PT Eval Low Complexity: 1 Low        Tasmia Blumer E, PT Acute Rehabilitation Services Pager [phone]  Office [phone]   Kordae Buonocore D Elonda Husky 08/06/2019, 4:31 PM

## 2019-08-06 NOTE — TOC Initial Note (Signed)
Transition of Care [facility]) - Initial/Assessment Note    Patient Details  Name: Heather Petty MRN: [id number] Date of Birth: 1950-11-18  [facility] CM/SW Contact:    Geralynn Ochs, LCSW Phone Number: 08/06/2019, 4:15 PM  Clinical Narrative:    CSW met with patient, confirmed plan to return to [facility] to continue rehab when stable. Patient admitted to [facility] for rehab, and patient said it was going well until she got sick. Patient agreeable to return to [facility] to finish up her rehab when she's stable. CSW sent referral to [facility], will continue to follow.               Expected Discharge Plan: Skilled Nursing Facility Barriers to Discharge: Continued Medical Work up   Patient Goals and CMS Choice Patient states their goals for this hospitalization and ongoing recovery are:: to get back to rehab CMS Medicare.gov Compare Post Acute Care list provided to:: Patient Choice offered to / list presented to : Patient  Expected Discharge Plan and Services Expected Discharge Plan: [place] Choice: [place] arrangements for the past 2 months: Single Family Home                                      Prior Living Arrangements/Services Living arrangements for the past 2 months: Single Family Home Lives with:: Self Patient language and need for interpreter reviewed:: No Do you feel safe going back to the place where you live?: Yes      Need for Family Participation in Patient Care: Yes (Comment) Care giver support system in place?: No (comment)   Criminal Activity/Legal Involvement Pertinent to Current Situation/Hospitalization: No - Comment as needed  Activities of Daily Living Home Assistive Devices/Equipment: Wheelchair, Environmental consultant (specify type) ADL Screening (condition at time of admission) Patient's cognitive ability adequate to safely complete daily activities?: Yes Is the patient deaf or have  difficulty hearing?: No Does the patient have difficulty seeing, even when wearing glasses/contacts?: Yes Does the patient have difficulty concentrating, remembering, or making decisions?: No Patient able to express need for assistance with ADLs?: Yes Does the patient have difficulty dressing or bathing?: Yes Independently performs ADLs?: No Communication: Independent Dressing (OT): Needs assistance Is this a change from baseline?: Pre-admission baseline Grooming: Needs assistance Is this a change from baseline?: Pre-admission baseline Feeding: Independent Bathing: Needs assistance Is this a change from baseline?: Pre-admission baseline Toileting: Needs assistance Is this a change from baseline?: Pre-admission baseline In/Out Bed: Needs assistance Is this a change from baseline?: Pre-admission baseline Walks in Home: Needs assistance Is this a change from baseline?: Pre-admission baseline Does the patient have difficulty walking or climbing stairs?: Yes Weakness of Legs: Both Weakness of Arms/Hands: Both  Permission Sought/Granted Permission sought to share information with : Facility Sport and exercise psychologist, Family Supports Permission granted to share information with : Yes, Verbal Permission Granted  Share Information with NAME: Margreta Journey  Permission granted to share info w AGENCY: SNF  Permission granted to share info w Relationship: Daughter     Emotional Assessment Appearance:: Appears stated age Attitude/Demeanor/Rapport: Engaged Affect (typically observed): Pleasant Orientation: : Oriented to Self, Oriented to Place, Oriented to  Time, Oriented to Situation Alcohol / Substance Use: Not Applicable Psych Involvement: No (comment)  Admission diagnosis:  Anemia [D64.9] Elevated LFTs [R79.89] Symptomatic anemia [D64.9] Patient Active Problem List   Diagnosis  Date Noted  . Anemia 08/05/2019  . Diarrhea 08/05/2019  . DKA (diabetic ketoacidoses) ([place]) 07/22/2019  .  Rhabdomyolysis 07/22/2019  . Acute metabolic encephalopathy 07/68/0881  . MRSA bacteremia 07/22/2019  . Enterococcal bacteremia 07/22/2019  . Vitiligo 07/22/2019  . 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 Sepsis ([place]) 07/19/2018  . Ventricular tachycardia, sustained ([place]) 07/19/2018  . NSTEMI (non-ST elevated myocardial infarction) ([place]) 07/19/2018  . Wound infection 04/03/2018  . Morbid obesity with BMI of 50.0-59.9, adult ([place]) 04/03/2018  . Elevated alkaline phosphatase level 04/03/2018  . Asthma exacerbation 03/14/2018  . Hypotension 03/14/2018  . Lactic acidosis 03/14/2018  . GERD (gastroesophageal reflux disease) 03/14/2018  . Acute renal failure superimposed on stage 3 chronic kidney disease ([place]) 03/14/2018  . Macrocytic anemia 03/14/2018  . Abnormal LFTs 03/14/2018  . Acute kidney injury ([place]) 02/28/2018  . Wound of sacral region 02/28/2018  . Hypothermia 02/28/2018  . Stage II pressure ulcer of sacral region ([place]) 02/15/2018  . DM II (diabetes mellitus, type II), controlled ([place]) 02/09/2018  . Schizophrenia ([place]) 02/09/2018  . Pressure ulcer 02/06/2018  . Acute renal failure (ARF) ([place]) 02/05/2018  . Hyperlipidemia associated with type 2 diabetes mellitus ([place]) 02/05/2018  . Hyperkalemia 02/05/2018  . Dermatitis 02/05/2018  . Open back wound 02/05/2018  . Dysuria 02/05/2018  . Paranoid schizophrenia ([place])   . AKI (acute kidney injury) ([place]) 12/17/2017  . Type II diabetes mellitus with renal manifestations ([place]) 12/17/2017  . HTN (hypertension) 12/17/2017  . Gout    PCP:  [name], MD Pharmacy:   [place], [place] [address] Phone: [phone] Fax: [phone]  [id number] - [place], [place] - [address] [place] [address] Phone: [phone] Fax: [phone]     Social Determinants of Health (SDOH)  Interventions    Readmission Risk Interventions No flowsheet data found.

## 2019-08-06 NOTE — Progress Notes (Signed)
[facility]                                                                              Patient Demographics  Heather Petty, is a 69 y.o. female, DOB - 12/22/1950, FOY:[id number]  Admit date - 08/05/2019   Admitting Physician Shela Leff, MD  Outpatient Primary MD for the patient is [name], MD  Outpatient specialists:   LOS - 1  days   Medical records reviewed and are as summarized below:    Chief Complaint  Patient presents with  . Abnormal Lab  . Abdominal Pain       Brief summary   Heather Petty is a 69 y.o. female with history of asthma, depression, insulin-dependent type 2 diabetes, gout, hypertension, hyperlipidemia, morbid obesity (BMI 45.82), schizophrenia, CVA presenting from [facility] for evaluation of low hemoglobin (5.6), diarrhea, abdominal pain, and shortness of breath.  Patient was recently admitted to the hospital 6/14-6/24 for sepsis secondary to MRSA and enterococcal bacteremia.  Source felt to be secondary to UTI and infected sacral decubitus ulcer.  Urine culture grew Klebsiella.  No valvular vegetations on TTE.  Infectious disease was consulted and recommended TEE which the patient refused.  PICC line was placed on 6/22 and she was discharged with plan to continue IV vancomycin for total of 6 weeks until 7/26. Patient reported that she was sent here from [facility] because her hemoglobin was low.  She has had nonbloody diarrhea for the past 2 days.  No nausea or vomiting.  She had some mild lower abdominal discomfort earlier which has now resolved.  Denies chest pain or shortness of breath.  No other complaints   Assessment & Plan    Principal Problem:   Diarrhea -Presented with diarrhea however abdominal exam benign, no fever or leukocytosis.  She has been on vancomycin till 7/26 after being recently diagnosed with MRSA and enterococcal bacteremia.  Patient reported no GI bleeding -Follow C. difficile PCR, stool  pathogen panel negative, continue enteric precautions  Active problems Dyspnea -Per admission note, shortness of breath was reported however chest x-ray showed no active disease, possibly due to anemia  Acute on chronic normocytic anemia -Hemoglobin 6.7 at the time of admission was 8.4 at the time of recent hospital discharge, baseline around 10 -FOBT negative, hemoglobin 7.4 after 1 unit packed RBCs, will transfuse 1 more unit today -Anemia panel consistent with anemia of chronic disease, may have bone marrow depression due to ongoing antibiotics and chronic illness -Obtain LDH, haptoglobin  Recent MRSA and enterococcal bacteremia -TTE did not reveal any valvular vegetations, ID had recommended TEE which patient had refused during previous admission -Blood cultures to 6/18 showed no growth, has a right upper extremity PICC line -Continue IV vancomycin for total of 6 weeks until 7/26, per pharmacy consult Follow C. difficile PCR  Sinus bradycardia -Patient is on nadolol outpatient, overnight heart rate low in 50s, no hypotension -Nadolol currently held  Acute kidney injury -Baseline creatinine 0.6-0.7, likely prerenal due to dehydration, in the setting of diarrhea and anemia.  On ACE inhibitor, vancomycin -Hold benazepril -Continue IV fluid hydration, packed RBC transfusion  Elevated LFTs -Unclear etiology, alk phos 440, total bili 2.2, ALT 78, AST 36, lipase normal -Abdominal ultrasound 6/29 showed no acute abnormality, hepatic steatosis -LFTs are trending down today, no GI complaints -Patient had CT chest abdomen pelvis on 6/14 which had shown no definite acute intrathoracic, abdominal or pelvic pathology, no acute or significant osseous findings.    Poorly controlled type 2 diabetes mellitus, IDDM with hyperglycemia -Hemoglobin A1c 10.4 on 6/15 -Continue sliding scale insulin, Lantus 20 units daily, hold glipizide   Hyperlipidemia Holding statin due to  transaminitis    Pressure Injury Documentation: -Sacral decub stage I, wound care consulted, skin intact, except for pinpoint area on the right buttock   Obesity Estimated body mass index is 45.82 kg/m as calculated from the following:   Height as of 07/21/19: 5' 1"  (1.549 m).   Weight as of this encounter: 110 kg.  Code Status: Full CODE STATUS DVT Prophylaxis:  Lovenox  Family Communication: Discussed all imaging results, lab results, explained to the patient   Disposition Plan:     Status is: Inpatient  Remains inpatient appropriate because:Inpatient level of care appropriate due to severity of illness   Dispo: The patient is from: SNF              Anticipated d/c is to: SNF              Anticipated d/c date is: 2 days              Patient currently is not medically stable to d/c.  Acute kidney injury, anemia, diarrhea, on IV antibiotics, work-up in progress   Time Spent in minutes 35 minutes  Procedures:  None  Consultants:   None  Antimicrobials:   Anti-infectives (From admission, onward)   None          Medications  Scheduled Meds: . sodium chloride   Intravenous Once  . allopurinol  300 mg Oral Daily  . vitamin C  500 mg Oral BID  . B-complex with vitamin C  1 tablet Oral Daily  . Chlorhexidine Gluconate Cloth  6 each Topical Daily  . doxepin  25 mg Oral QHS  . enoxaparin (LOVENOX) injection  40 mg Subcutaneous Q24H  . famotidine  20 mg Oral BID  . folic acid  1 mg Oral Daily  . insulin aspart  0-15 Units Subcutaneous TID WC  . insulin aspart  0-5 Units Subcutaneous QHS  . insulin glargine  20 Units Subcutaneous Daily  . pravastatin  40 mg Oral QHS   Continuous Infusions: PRN Meds:.acetaminophen **OR** acetaminophen, azelastine **AND** fluticasone, bisacodyl, diphenhydrAMINE-zinc acetate      Subjective:   Heather Petty was seen and examined today.  States overnight no diarrhea, had no melanotic stool stools.  No chest pain or  shortness of breath. Patient denies dizziness, nausea vomiting, abdominal pain.  No acute events overnight.    Objective:   Vitals:   08/06/19 0050 08/06/19 0052 08/06/19 0419 08/06/19 0944  BP: (!) 127/50 (!) 145/63  (!) 112/48  Pulse: 68 67  60  Resp:    18  Temp:   98 F (36.7 C) 98.6 F (37 C)  TempSrc:   Oral Oral  SpO2: 99%   100%  Weight:        Intake/Output Summary (Last 24 hours) at 08/06/2019 1105 Last data filed at 08/06/2019 0900 Gross per 24 hour  Intake 846.53 ml  Output --  Net 846.53 ml     Wt Readings  from Last 3 Encounters:  08/05/19 110 kg  07/21/19 110 kg  07/21/18 106.3 kg     Exam  General: Alert and oriented x 3, NAD  Cardiovascular: S1 S2 auscultated, no murmurs, RRR  Respiratory: Clear to auscultation bilaterally, no wheezing, rales or rhonchi  Gastrointestinal: Soft, nontender, nondistended, + bowel sounds  Ext: no pedal edema bilaterally  Neuro: No new deficits  Musculoskeletal: No digital cyanosis, clubbing  Skin: No rashes  Psych: Normal affect and demeanor, alert and oriented x3    Data Reviewed:  I have personally reviewed following labs and imaging studies  Micro Results Recent Results (from the past 240 hour(s))  Culture, Urine     Status: None   Collection Time: 07/28/19 12:36 PM   Specimen: Urine, Random  Result Value Ref Range Status   Specimen Description URINE, RANDOM  Final   Special Requests NONE  Final   Culture   Final    NO GROWTH Performed at [facility], [address], [place], [place] [address]    Report Status 07/29/2019 FINAL  Final  SARS CORONAVIRUS 2 (TAT 6-24 HRS) Nasopharyngeal Nasopharyngeal Swab     Status: None   Collection Time: 07/31/19 11:45 AM   Specimen: Nasopharyngeal Swab  Result Value Ref Range Status   SARS Coronavirus 2 NEGATIVE NEGATIVE Final    Comment: (NOTE) SARS-CoV-2 target nucleic acids are NOT DETECTED.  The SARS-CoV-2 RNA is generally detectable in upper and  lower respiratory specimens during the acute phase of infection. Negative results do not preclude SARS-CoV-2 infection, do not rule out co-infections with other pathogens, and should not be used as the sole basis for treatment or other patient management decisions. Negative results must be combined with clinical observations, patient history, and epidemiological information. The expected result is Negative.  Fact Sheet for Patients: SugarRoll.be  Fact Sheet for Healthcare Providers: https://www.woods-mathews.com/  This test is not yet approved or cleared by the [place] FDA and  has been authorized for detection and/or diagnosis of SARS-CoV-2 by FDA under an Emergency Use Authorization (EUA). This EUA will remain  in effect (meaning this test can be used) for the duration of the COVID-19 declaration under Se ction 564(b)(1) of the Act, 21 U.S.C. section 360bbb-3(b)(1), unless the authorization is terminated or revoked sooner.  Performed at [facility], [place] [address] [place],  [address]   SARS Coronavirus 2 by RT PCR (hospital order, performed in [facility] hospital lab) Nasopharyngeal Nasopharyngeal Swab     Status: None   Collection Time: 08/05/19  7:50 PM   Specimen: Nasopharyngeal Swab  Result Value Ref Range Status   SARS Coronavirus 2 NEGATIVE NEGATIVE Final    Comment: (NOTE) SARS-CoV-2 target nucleic acids are NOT DETECTED.  The SARS-CoV-2 RNA is generally detectable in upper and lower respiratory specimens during the acute phase of infection. The lowest concentration of SARS-CoV-2 viral copies this assay can detect is 250 copies / mL. A negative result does not preclude SARS-CoV-2 infection and should not be used as the sole basis for treatment or other patient management decisions.  A negative result may occur with improper specimen collection / handling, submission of specimen other than nasopharyngeal  swab, presence of viral mutation(s) within the areas targeted by this assay, and inadequate number of viral copies (<250 copies / mL). A negative result must be combined with clinical observations, patient history, and epidemiological information.  Fact Sheet for Patients:   StrictlyIdeas.no  Fact Sheet for Healthcare  Providers: BankingDealers.co.za  This test is not yet approved or  cleared by the [place] and has been authorized for detection and/or diagnosis of SARS-CoV-2 by FDA under an Emergency Use Authorization (EUA).  This EUA will remain in effect (meaning this test can be used) for the duration of the COVID-19 declaration under Section 564(b)(1) of the Act, 21 U.S.C. section 360bbb-3(b)(1), unless the authorization is terminated or revoked sooner.  Performed at [facility], [place] [address], [place], [place] [address]     Radiology Reports CT ABDOMEN PELVIS WO CONTRAST  Addendum Date: 07/22/2019   ADDENDUM REPORT: 07/22/2019 00:43 ADDENDUM: Along the posterior left subcutaneous soft tissues overlying the iliac there is a focal area of ulceration and fat stranding changes. No loculated fluid collections or subcutaneous emphysema. No cortical destruction or bony erosion however seen at the posterior iliac. Electronically Signed   By: Prudencio Pair M.D.   On: 07/22/2019 00:43   Result Date: 07/22/2019 CLINICAL DATA:  Fever of unknown origin EXAM: CT CHEST, abdomen, and pelvis WITHOUT CONTRAST TECHNIQUE: Multidetector CT imaging of the chest, abdomen and pelvis was performed following the standard protocol without IV contrast. COMPARISON:  July 19, 2018 FINDINGS: Cardiovascular: Scattered aortic atherosclerosis is noted. Coronary artery calcifications are seen. The heart size is normal. There is no pericardial thickening or effusion. Mediastinum/Nodes: There are no enlarged mediastinal, hilar or axillary lymph nodes. The  thyroid gland, trachea and esophagus demonstrate no significant findings. Lungs/Pleura: Minimal streaky atelectasis seen at both lung bases. Small amount of tree-in-bud opacity seen at the posterior periphery of the right lung base as on the prior exam. There is a small 5 mm pulmonary nodule at the right lung base which was partially visualized on prior exam. No pleural effusion Upper abdomen: The visualized portion of the upper abdomen is unremarkable. Musculoskeletal/Chest wall: There is no chest wall mass or suspicious osseous finding. 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 Abdomen/pelvis: Hepatobiliary: There is diffuse low density seen throughout the liver parenchyma. No focal hepatic lesion although limited due to the lack of intravenous contrast. Layering hyperdense sludge/small stones are noted. Ill intrahepatic biliary ductal dilatation. Pancreas: Unremarkable.  No surrounding inflammatory changes. Spleen: Normal in size. Although limited due to the lack of intravenous contrast, normal in appearance. Adrenals/Urinary Tract: Both adrenal glands appear normal. The kidneys and collecting system appear normal without evidence of urinary tract calculus or hydronephrosis. Bladder is unremarkable. Stomach/Bowel: The stomach, small bowel, and colon are normal in appearance. No inflammatory changes or obstructive findings. Diverticulosis without diverticulitis is seen. The appendix is unremarkable. Vascular/Lymphatic: There are no enlarged abdominal or pelvic lymph nodes. Scattered aortic atherosclerotic calcifications are seen without aneurysmal dilatation. Reproductive: The uterus and adnexa are unremarkable. Other: No evidence of abdominal wall mass or hernia. Musculoskeletal: No acute or significant osseous findings. IMPRESSION: 1. No definite acute intrathoracic, abdominal, or pelvic pathology to explain the  patient's symptoms. 2. Unchanged minimal tree-in-bud opacities at the posterior periphery of the right lung base, likely due to chronic inflammatory process. 3.  Cholelithiasis without evidence of acute cholecystitis. 4. 5 mm pulmonary nodule at the right lung base. No follow-up recommended. This recommendation follows the consensus statement: Guidelines for Management of Incidental Pulmonary Nodules Detected on CT Images: From the Fleischner Society 2017; Radiology 2017; 284:228-243. 5.  Aortic Atherosclerosis (ICD10-I70.0). Electronically Signed: By: Prudencio Pair M.D. On: 07/22/2019 00:12   DG  Chest 2 View  Result Date: 07/21/2019 CLINICAL DATA:  Suspected sepsis. EXAM: CHEST - 2 VIEW COMPARISON:  Radiograph 07/19/2018 FINDINGS: Low lung volumes. Mild cardiomegaly. Aortic atherosclerosis. Peribronchial thickening. There are streaky bibasilar opacities. No confluent airspace disease. No significant pleural effusion. No pneumothorax. No acute osseous abnormalities are seen. IMPRESSION: 1. Low lung volumes with streaky bibasilar opacities, favoring atelectasis. 2. Peribronchial thickening. 3. Mild cardiomegaly. Electronically Signed   By: Keith Rake M.D.   On: 07/21/2019 22:46   DG Shoulder Right  Result Date: 07/24/2019 CLINICAL DATA:  Unwitnessed fall EXAM: RIGHT SHOULDER - 2+ VIEW COMPARISON:  None. FINDINGS: Degenerative changes in the right [facility] and glenohumeral joints with joint space narrowing and spurring. No acute bony abnormality. Specifically, no fracture, subluxation, or dislocation. IMPRESSION: Degenerative changes.  No acute bony abnormality. Electronically Signed   By: Rolm Baptise M.D.   On: 07/24/2019 20:55   CT Head Wo Contrast  Result Date: 07/21/2019 CLINICAL DATA:  Altered mental status. EXAM: CT HEAD WITHOUT CONTRAST TECHNIQUE: Contiguous axial images were obtained from the base of the skull through the vertex without intravenous contrast. COMPARISON:  Head CT 05/07/2018  FINDINGS: Brain: No intracranial hemorrhage, mass effect, or midline shift. Normal brain volume for age. No hydrocephalus. The basilar cisterns are patent. Mild chronic small vessel ischemia. No evidence of territorial infarct or acute ischemia. No extra-axial or intracranial fluid collection. Vascular: Atherosclerosis of skullbase vasculature without hyperdense vessel or abnormal calcification. Skull: No fracture or focal lesion. Sinuses/Orbits: Paranasal sinuses and mastoid air cells are clear. The visualized orbits are unremarkable. Other: None. IMPRESSION: 1. No acute intracranial abnormality. 2. Mild chronic small vessel ischemia. Electronically Signed   By: Keith Rake M.D.   On: 07/21/2019 22:59   CT Chest Wo Contrast  Addendum Date: 07/22/2019   ADDENDUM REPORT: 07/22/2019 00:43 ADDENDUM: Along the posterior left subcutaneous soft tissues overlying the iliac there is a focal area of ulceration and fat stranding changes. No loculated fluid collections or subcutaneous emphysema. No cortical destruction or bony erosion however seen at the posterior iliac. Electronically Signed   By: Prudencio Pair M.D.   On: 07/22/2019 00:43   Result Date: 07/22/2019 CLINICAL DATA:  Fever of unknown origin EXAM: CT CHEST, abdomen, and pelvis WITHOUT CONTRAST TECHNIQUE: Multidetector CT imaging of the chest, abdomen and pelvis was performed following the standard protocol without IV contrast. COMPARISON:  July 19, 2018 FINDINGS: Cardiovascular: Scattered aortic atherosclerosis is noted. Coronary artery calcifications are seen. The heart size is normal. There is no pericardial thickening or effusion. Mediastinum/Nodes: There are no enlarged mediastinal, hilar or axillary lymph nodes. The thyroid gland, trachea and esophagus demonstrate no significant findings. Lungs/Pleura: Minimal streaky atelectasis seen at both lung bases. Small amount of tree-in-bud opacity seen at the posterior periphery of the right lung base as on  the prior exam. There is a small 5 mm pulmonary nodule at the right lung base which was partially visualized on prior exam. No pleural effusion Upper abdomen: The visualized portion of the upper abdomen is unremarkable. Musculoskeletal/Chest wall: There is no chest wall mass or suspicious osseous finding. 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 Abdomen/pelvis: Hepatobiliary: There is diffuse low density seen throughout the liver parenchyma. No focal hepatic lesion although limited due to the lack of intravenous contrast. Layering hyperdense sludge/small stones are noted. Ill intrahepatic biliary ductal dilatation. Pancreas: Unremarkable.  No surrounding inflammatory changes. Spleen: Normal  in size. Although limited due to the lack of intravenous contrast, normal in appearance. Adrenals/Urinary Tract: Both adrenal glands appear normal. The kidneys and collecting system appear normal without evidence of urinary tract calculus or hydronephrosis. Bladder is unremarkable. Stomach/Bowel: The stomach, small bowel, and colon are normal in appearance. No inflammatory changes or obstructive findings. Diverticulosis without diverticulitis is seen. The appendix is unremarkable. Vascular/Lymphatic: There are no enlarged abdominal or pelvic lymph nodes. Scattered aortic atherosclerotic calcifications are seen without aneurysmal dilatation. Reproductive: The uterus and adnexa are unremarkable. Other: No evidence of abdominal wall mass or hernia. Musculoskeletal: No acute or significant osseous findings. IMPRESSION: 1. No definite acute intrathoracic, abdominal, or pelvic pathology to explain the patient's symptoms. 2. Unchanged minimal tree-in-bud opacities at the posterior periphery of the right lung base, likely due to chronic inflammatory process. 3.  Cholelithiasis without evidence of acute cholecystitis. 4. 5 mm pulmonary nodule  at the right lung base. No follow-up recommended. This recommendation follows the consensus statement: Guidelines for Management of Incidental Pulmonary Nodules Detected on CT Images: From the Fleischner Society 2017; Radiology 2017; [id number]. 5.  Aortic Atherosclerosis (ICD10-I70.0). Electronically Signed: By: Prudencio Pair M.D. On: 07/22/2019 00:12   DG Chest Port 1 View  Result Date: 08/05/2019 CLINICAL DATA:  Shortness of breath EXAM: PORTABLE CHEST 1 VIEW COMPARISON:  07/23/2019 FINDINGS: No focal opacity or pleural effusion. Stable cardiomediastinal silhouette with aortic atherosclerosis. No pneumothorax. Right upper extremity central venous catheter with tip over the SVC. IMPRESSION: No active disease. Electronically Signed   By: Donavan Foil M.D.   On: 08/05/2019 20:28   DG CHEST PORT 1 VIEW  Result Date: 07/23/2019 CLINICAL DATA:  Tachypnea EXAM: PORTABLE CHEST 1 VIEW COMPARISON:  07/21/2019 FINDINGS: The heart size and mediastinal contours are within normal limits. Aortic atherosclerosis. Both lungs are clear. The visualized skeletal structures are unremarkable. IMPRESSION: No active disease. Electronically Signed   By: Donavan Foil M.D.   On: 07/23/2019 19:39   DG Humerus Right  Result Date: 07/24/2019 CLINICAL DATA:  Unwitnessed fall EXAM: RIGHT HUMERUS - 2+ VIEW COMPARISON:  Shoulder series today FINDINGS: Degenerative changes in the right AC and glenohumeral joints. No acute bony abnormality. Specifically, no fracture, subluxation, or dislocation. IMPRESSION: No acute bony abnormality. Electronically Signed   By: Rolm Baptise M.D.   On: 07/24/2019 20:56   ECHOCARDIOGRAM COMPLETE  Result Date: 07/23/2019    ECHOCARDIOGRAM REPORT   Patient Name:   SHYLA GAYHEART Date of Exam: 07/23/2019 Medical Rec #:  [id number]        Height:       61.0 in Accession #:    [id number]       Weight:       242.5 lb Date of Birth:  09/02/1950        BSA:          2.050 m Patient Age:    88 years          BP:           135/56 mmHg Patient Gender: F                HR:           84 bpm. Exam Location:  Inpatient Procedure: 2D Echo, Cardiac Doppler and Color Doppler Indications:    Bacteremia  History:        Patient has prior history of Echocardiogram examinations, most  recent 07/20/2018. Risk Factors:Diabetes, Hypertension,                 Dyslipidemia and Former Smoker. MRSA bacteremia.  Sonographer:    Clayton Lefort RDCS (AE) Referring Phys: [place]  1. Left ventricular ejection fraction, by estimation, is 65 to 70%. The left ventricle has normal function. The left ventricle has no regional wall motion abnormalities. There is mild concentric left ventricular hypertrophy. Left ventricular diastolic parameters are consistent with Grade I diastolic dysfunction (impaired relaxation).  2. Right ventricular systolic function is normal. The right ventricular size is normal. There is normal pulmonary artery systolic pressure. The estimated right ventricular systolic pressure is 50.5 mmHg.  3. The mitral valve is normal in structure. No evidence of mitral valve regurgitation. No evidence of mitral stenosis.  4. The aortic valve has an indeterminant number of cusps. Aortic valve regurgitation is not visualized. Mild to moderate aortic valve sclerosis/calcification is present, without any evidence of aortic stenosis.  5. The inferior vena cava is normal in size with greater than 50% respiratory variability, suggesting right atrial pressure of 3 mmHg. Conclusion(s)/Recommendation(s): No evidence of valvular vegetations on this transthoracic echocardiogram. Would recommend a transesophageal echocardiogram to exclude infective endocarditis if clinically indicated. FINDINGS  Left Ventricle: Left ventricular ejection fraction, by estimation, is 65 to 70%. The left ventricle has normal function. The left ventricle has no regional wall motion abnormalities. The left ventricular internal cavity size  was normal in size. There is  mild concentric left ventricular hypertrophy. Left ventricular diastolic parameters are consistent with Grade I diastolic dysfunction (impaired relaxation). Normal left ventricular filling pressure. Right Ventricle: The right ventricular size is normal. No increase in right ventricular wall thickness. Right ventricular systolic function is normal. There is normal pulmonary artery systolic pressure. The tricuspid regurgitant velocity is 2.48 m/s, and  with an assumed right atrial pressure of 3 mmHg, the estimated right ventricular systolic pressure is 18.3 mmHg. Left Atrium: Left atrial size was normal in size. Right Atrium: Right atrial size was normal in size. Pericardium: There is no evidence of pericardial effusion. Mitral Valve: The mitral valve is normal in structure. Normal mobility of the mitral valve leaflets. Mild mitral annular calcification. No evidence of mitral valve regurgitation. No evidence of mitral valve stenosis. MV peak gradient, 3.5 mmHg. The mean mitral valve gradient is 2.0 mmHg. Tricuspid Valve: The tricuspid valve is normal in structure. Tricuspid valve regurgitation is trivial. No evidence of tricuspid stenosis. Aortic Valve: The aortic valve has an indeterminant number of cusps. Aortic valve regurgitation is not visualized. Mild to moderate aortic valve sclerosis/calcification is present, without any evidence of aortic stenosis. Aortic valve mean gradient measures 5.0 mmHg. Aortic valve peak gradient measures 12.7 mmHg. Aortic valve area, by VTI measures 2.20 cm. Pulmonic Valve: The pulmonic valve was normal in structure. Pulmonic valve regurgitation is not visualized. No evidence of pulmonic stenosis. Aorta: The aortic root is normal in size and structure. Venous: The inferior vena cava is normal in size with greater than 50% respiratory variability, suggesting right atrial pressure of 3 mmHg. IAS/Shunts: The interatrial septum appears to be lipomatous. No  atrial level shunt detected by color flow Doppler.  LEFT VENTRICLE PLAX 2D LVIDd:         3.50 cm  Diastology LVIDs:         2.20 cm  LV e' lateral:   8.38 cm/s LV PW:         1.20 cm  LV  E/e' lateral: 9.9 LV IVS:        1.10 cm  LV e' medial:    7.94 cm/s LVOT diam:     1.70 cm  LV E/e' medial:  10.4 LV SV:         64 LV SV Index:   31 LVOT Area:     2.27 cm  RIGHT VENTRICLE             IVC RV Basal diam:  3.00 cm     IVC diam: 1.50 cm RV S prime:     11.00 cm/s TAPSE (M-mode): 1.6 cm LEFT ATRIUM             Index       RIGHT ATRIUM           Index LA diam:        2.40 cm 1.17 cm/m  RA Area:     11.10 cm LA Vol (A2C):   54.7 ml 26.68 ml/m RA Volume:   22.60 ml  11.02 ml/m LA Vol (A4C):   36.9 ml 18.00 ml/m LA Biplane Vol: 45.4 ml 22.15 ml/m  AORTIC VALVE AV Area (Vmax):    2.10 cm AV Area (Vmean):   2.23 cm AV Area (VTI):     2.20 cm AV Vmax:           178.00 cm/s AV Vmean:          109.000 cm/s AV VTI:            0.293 m AV Peak Grad:      12.7 mmHg AV Mean Grad:      5.0 mmHg LVOT Vmax:         165.00 cm/s LVOT Vmean:        107.000 cm/s LVOT VTI:          0.284 m LVOT/AV VTI ratio: 0.97  AORTA Ao Root diam: 2.60 cm Ao Asc diam:  3.20 cm MITRAL VALVE                TRICUSPID VALVE MV Area (PHT): 2.69 cm     TR Peak grad:   24.6 mmHg MV Peak grad:  3.5 mmHg     TR Vmax:        248.00 cm/s MV Mean grad:  2.0 mmHg MV Vmax:       0.94 m/s     SHUNTS MV Vmean:      58.4 cm/s    Systemic VTI:  0.28 m MV Decel Time: 282 msec     Systemic Diam: 1.70 cm MV E velocity: 82.60 cm/s MV A velocity: 102.00 cm/s MV E/A ratio:  0.81 Fransico Him MD Electronically signed by Fransico Him MD Signature Date/Time: 07/23/2019/4:37:10 PM    Final    [place] EKG SITE RITE  Result Date: 07/28/2019 If Site Rite image not attached, placement could not be confirmed due to current cardiac rhythm.  US Abdomen Limited RUQ  Result Date: 08/05/2019 CLINICAL DATA:  Elevated LFTs EXAM: ULTRASOUND ABDOMEN LIMITED RIGHT UPPER QUADRANT  COMPARISON:  CT dated July 21, 2019 FINDINGS: Gallbladder: No gallstones or wall thickening visualized. No sonographic Murphy sign noted by sonographer. Common bile duct: Diameter: 6 mm Liver: Diffuse increased echogenicity with slightly heterogeneous liver. Appearance typically secondary to fatty infiltration. Fibrosis secondary consideration. No secondary findings of cirrhosis noted. No focal hepatic lesion or intrahepatic biliary duct dilatation. Portal vein is patent on color Doppler imaging with normal direction of blood flow towards  the liver. Other: None. IMPRESSION: 1. No acute abnormality. 2. Hepatic steatosis. Electronically Signed   By: Constance Holster M.D.   On: 08/05/2019 22:30    Lab Data:  CBC: Recent Labs  Lab 08/05/19 1329 08/06/19 0054  WBC 5.7  --   HGB 6.7* 7.4*  HCT 22.5* 24.4*  MCV 98.3  --   PLT 229  --    Basic Metabolic Panel: Recent Labs  Lab 07/31/19 0527 08/05/19 1329 08/06/19 0054  NA 135 141 140  K 2.9* 4.0 4.0  CL 101 112* 113*  CO2 27 21* 21*  GLUCOSE 163* 232* 225*  BUN 9 22 23   CREATININE 0.78 1.51* 1.58*  CALCIUM 7.7* 8.3* 8.4*  MG 1.8  --   --    GFR: Estimated Creatinine Clearance: 38 mL/min (A) (by C-G formula based on SCr of 1.58 mg/dL (H)). Liver Function Tests: Recent Labs  Lab 08/05/19 1329 08/06/19 0054  AST 36 24  ALT 78* 66*  ALKPHOS 440* 370*  BILITOT 2.0* 2.1*  PROT 5.9* 5.7*  ALBUMIN 2.6* 2.5*   Recent Labs  Lab 08/05/19 1329  LIPASE 42   No results for input(s): AMMONIA in the last 168 hours. Coagulation Profile: No results for input(s): INR, PROTIME in the last 168 hours. Cardiac Enzymes: No results for input(s): CKTOTAL, CKMB, CKMBINDEX, TROPONINI in the last 168 hours. BNP (last 3 results) No results for input(s): PROBNP in the last 8760 hours. HbA1C: No results for input(s): HGBA1C in the last 72 hours. CBG: Recent Labs  Lab 07/31/19 1213 07/31/19 1627 07/31/19 2045 08/05/19 2234 08/06/19 0648   GLUCAP 176* 258* 168* 146* 112*   Lipid Profile: No results for input(s): CHOL, HDL, LDLCALC, TRIG, CHOLHDL, LDLDIRECT in the last 72 hours. Thyroid Function Tests: No results for input(s): TSH, T4TOTAL, FREET4, T3FREE, THYROIDAB in the last 72 hours. Anemia Panel: Recent Labs    08/06/19 0054  VITAMINB12 1,281*  FOLATE 16.7  FERRITIN 1,095*  TIBC 326  IRON 63  RETICCTPCT 1.7   Urine analysis:    Component Value Date/Time   COLORURINE AMBER (A) 07/28/2019 1236   APPEARANCEUR CLEAR 07/28/2019 1236   LABSPEC 1.014 07/28/2019 1236   PHURINE 7.0 07/28/2019 1236   GLUCOSEU NEGATIVE 07/28/2019 1236   HGBUR NEGATIVE 07/28/2019 1236   BILIRUBINUR NEGATIVE 07/28/2019 1236   KETONESUR NEGATIVE 07/28/2019 1236   PROTEINUR 30 (A) 07/28/2019 1236   UROBILINOGEN 0.2 12/27/2010 1858   NITRITE NEGATIVE 07/28/2019 1236   LEUKOCYTESUR NEGATIVE 07/28/2019 1236     Tayah Idrovo M.D. [facility] 08/06/2019, 11:05 AM   Call night coverage person covering after 7pm

## 2019-08-06 NOTE — NC FL2 (Signed)
[place] LEVEL OF CARE SCREENING TOOL     IDENTIFICATION  Patient Name: Heather Petty Birthdate: Jun 30, 1950 Sex: female Admission Date (Current Location): 08/05/2019  [facility] and [place] Number:  Herbalist and Address:  [facility], [place] [address], [place] [address]      Provider Number: [id number]  Attending Physician Name and Address:  Mendel Corning, MD  Relative Name and Phone Number:       Current Level of Care: Hospital Recommended Level of Care: [place] Prior Approval Number:    Date Approved/Denied:   PASRR Number: [id number] F, Expires 08/30/19  Discharge Plan: SNF    Current Diagnoses: Patient Active Problem List   Diagnosis Date Noted  . Anemia 08/05/2019  . Diarrhea 08/05/2019  . DKA (diabetic ketoacidoses) ([place]) 07/22/2019  . Rhabdomyolysis 07/22/2019  . Acute metabolic encephalopathy 85/46/2703  . MRSA bacteremia 07/22/2019  . Enterococcal bacteremia 07/22/2019  . Vitiligo 07/22/2019  . 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 Sepsis ([place]) 07/19/2018  . Ventricular tachycardia, sustained ([place]) 07/19/2018  . NSTEMI (non-ST elevated myocardial infarction) ([place]) 07/19/2018  . Wound infection 04/03/2018  . Morbid obesity with BMI of 50.0-59.9, adult ([place]) 04/03/2018  . Elevated alkaline phosphatase level 04/03/2018  . Asthma exacerbation 03/14/2018  . Hypotension 03/14/2018  . Lactic acidosis 03/14/2018  . GERD (gastroesophageal reflux disease) 03/14/2018  . Acute renal failure superimposed on stage 3 chronic kidney disease ([place]) 03/14/2018  . Macrocytic anemia 03/14/2018  . Abnormal LFTs 03/14/2018  . Acute kidney injury ([place]) 02/28/2018  . Wound of sacral region 02/28/2018  . Hypothermia 02/28/2018  . Stage II pressure ulcer of sacral region ([place]) 02/15/2018  . DM II (diabetes mellitus, type II), controlled ([place]) 02/09/2018  . Schizophrenia ([place]) 02/09/2018  . Pressure ulcer  02/06/2018  . Acute renal failure (ARF) ([place]) 02/05/2018  . Hyperlipidemia associated with type 2 diabetes mellitus ([place]) 02/05/2018  . Hyperkalemia 02/05/2018  . Dermatitis 02/05/2018  . Open back wound 02/05/2018  . Dysuria 02/05/2018  . Paranoid schizophrenia ([place])   . AKI (acute kidney injury) ([place]) 12/17/2017  . Type II diabetes mellitus with renal manifestations ([place]) 12/17/2017  . HTN (hypertension) 12/17/2017  . Gout     Orientation RESPIRATION BLADDER Height & Weight     Self, Time, Situation, Place  O2 ([place] 2L) Incontinent, Continent (incontinent at times) Weight: 242 lb 8.1 oz (110 kg) Height:     BEHAVIORAL SYMPTOMS/MOOD NEUROLOGICAL BOWEL NUTRITION STATUS      Continent Diet (see DC summary)  AMBULATORY STATUS COMMUNICATION OF NEEDS Skin   Extensive Assist Verbally PU Stage and Appropriate Care   PU Stage 2 Dressing: No Dressing (sacrum, mid buttocks; no dressing)                   Personal Care Assistance Level of Assistance  Feeding, Bathing, Dressing Bathing Assistance: Limited assistance Feeding assistance: Independent Dressing Assistance: Limited assistance     Functional Limitations Info             SPECIAL CARE FACTORS FREQUENCY  PT (By licensed PT), OT (By licensed OT)     PT Frequency: 5x/wk OT Frequency: 5x/wk            Contractures Contractures Info: Not present    Additional Factors Info  Code Status, Allergies, Insulin Sliding Scale Code Status Info: Full Allergies Info: Lasix   Insulin Sliding Scale Info: 0-15 units 3x/day with meals; 0-5 units  daily at bed; Lantus 20 units daily       Current Medications (08/06/2019):  This is the current hospital active medication list Current Facility-Administered Medications  Medication Dose Route Frequency Provider Last Rate Last Admin  . 0.9 %  sodium chloride infusion (Manually program via Guardrails IV Fluids)   Intravenous Once Rai, Ripudeep K, MD      . acetaminophen (TYLENOL)  tablet 650 mg  650 mg Oral Q6H PRN Shela Leff, MD       Or  . acetaminophen (TYLENOL) suppository 650 mg  650 mg Rectal Q6H PRN Shela Leff, MD      . allopurinol (ZYLOPRIM) tablet 300 mg  300 mg Oral Daily Rai, Ripudeep K, MD   300 mg at 08/06/19 1353  . ascorbic acid (VITAMIN C) tablet 500 mg  500 mg Oral BID Rai, Ripudeep K, MD   500 mg at 08/06/19 1352  . azelastine (ASTELIN) 0.1 % nasal spray 1 spray  1 spray Each Nare BID PRN Rai, Ripudeep K, MD       And  . fluticasone (FLONASE) 50 MCG/ACT nasal spray 1 spray  1 spray Each Nare BID PRN Rai, Ripudeep K, MD      . B-complex with vitamin C tablet 1 tablet  1 tablet Oral Daily Rai, Ripudeep K, MD   1 tablet at 08/06/19 1353  . bisacodyl (DULCOLAX) EC tablet 5 mg  5 mg Oral Daily PRN Rai, Ripudeep K, MD      . Chlorhexidine Gluconate Cloth 2 % PADS 6 each  6 each Topical Daily Shela Leff, MD   6 each at 08/06/19 1100  . diphenhydrAMINE-zinc acetate (BENADRYL) 2-5.6 % cream 1 application  1 application Topical TID PRN Rai, Ripudeep K, MD      . doxepin (SINEQUAN) capsule 25 mg  25 mg Oral QHS Rai, Ripudeep K, MD      . enoxaparin (LOVENOX) injection 40 mg  40 mg Subcutaneous Q24H Shela Leff, MD      . famotidine (PEPCID) tablet 20 mg  20 mg Oral BID Rai, Ripudeep K, MD   20 mg at 08/06/19 1353  . folic acid (FOLVITE) tablet 1 mg  1 mg Oral Daily Rai, Ripudeep K, MD   1 mg at 08/06/19 1353  . insulin aspart (novoLOG) injection 0-15 Units  0-15 Units Subcutaneous TID WC Shela Leff, MD      . insulin aspart (novoLOG) injection 0-5 Units  0-5 Units Subcutaneous QHS Shela Leff, MD      . insulin glargine (LANTUS) injection 20 Units  20 Units Subcutaneous Daily Rai, Ripudeep K, MD   20 Units at 08/06/19 1354  . vancomycin (VANCOREADY) IVPB 1250 mg/250 mL  1,250 mg Intravenous Once Wendee Beavers, [facility]         Discharge Medications: Please see discharge summary for a list of discharge  medications.  Relevant Imaging Results:  Relevant Lab Results:   Additional Information SS#: [id number]  Geralynn Ochs, LCSW

## 2019-08-07 LAB — TYPE AND SCREEN
ABO/RH(D): O NEG
Antibody Screen: NEGATIVE
Unit division: 0
Unit division: 0

## 2019-08-07 LAB — CBC
HCT: 27.8 % — ABNORMAL LOW (ref 36.0–46.0)
Hemoglobin: 8.9 g/dL — ABNORMAL LOW (ref 12.0–15.0)
MCH: 30.1 pg (ref 26.0–34.0)
MCHC: 32 g/dL (ref 30.0–36.0)
MCV: 93.9 fL (ref 80.0–100.0)
Platelets: 211 10*3/uL (ref 150–400)
RBC: 2.96 MIL/uL — ABNORMAL LOW (ref 3.87–5.11)
RDW: 18.4 % — ABNORMAL HIGH (ref 11.5–15.5)
WBC: 6.4 10*3/uL (ref 4.0–10.5)
nRBC: 0 % (ref 0.0–0.2)

## 2019-08-07 LAB — BPAM RBC
Blood Product Expiration Date: 202107082359
Blood Product Expiration Date: 202107222359
ISSUE DATE / TIME: 202106291929
ISSUE DATE / TIME: 202106301831
Unit Type and Rh: 9500
Unit Type and Rh: 9500

## 2019-08-07 LAB — GLUCOSE, CAPILLARY
Glucose-Capillary: 86 mg/dL (ref 70–99)
Glucose-Capillary: 160 mg/dL — ABNORMAL HIGH (ref 70–99)
Glucose-Capillary: 140 mg/dL — ABNORMAL HIGH (ref 70–99)
Glucose-Capillary: 145 mg/dL — ABNORMAL HIGH (ref 70–99)

## 2019-08-07 LAB — COMPREHENSIVE METABOLIC PANEL
ALT: 45 U/L — ABNORMAL HIGH (ref 0–44)
AST: 18 U/L (ref 15–41)
Albumin: 2.5 g/dL — ABNORMAL LOW (ref 3.5–5.0)
Alkaline Phosphatase: 272 U/L — ABNORMAL HIGH (ref 38–126)
Anion gap: 6 (ref 5–15)
BUN: 22 mg/dL (ref 8–23)
CO2: 21 mmol/L — ABNORMAL LOW (ref 22–32)
Calcium: 8.3 mg/dL — ABNORMAL LOW (ref 8.9–10.3)
Chloride: 113 mmol/L — ABNORMAL HIGH (ref 98–111)
Creatinine, Ser: 1.42 mg/dL — ABNORMAL HIGH (ref 0.44–1.00)
GFR calc Af Amer: 43 mL/min — ABNORMAL LOW (ref >60)
GFR calc non Af Amer: 37 mL/min — ABNORMAL LOW (ref >60)
Glucose, Bld: 88 mg/dL (ref 70–99)
Potassium: 4.2 mmol/L (ref 3.5–5.1)
Sodium: 140 mmol/L (ref 135–145)
Total Bilirubin: 1.9 mg/dL — ABNORMAL HIGH (ref 0.3–1.2)
Total Protein: 5.7 g/dL — ABNORMAL LOW (ref 6.5–8.1)

## 2019-08-07 LAB — CREATININE, SERUM
Creatinine, Ser: 1.3 mg/dL — ABNORMAL HIGH (ref 0.44–1.00)
GFR calc Af Amer: 48 mL/min — ABNORMAL LOW (ref >60)
GFR calc non Af Amer: 42 mL/min — ABNORMAL LOW (ref >60)

## 2019-08-07 LAB — HEMOGLOBIN AND HEMATOCRIT, BLOOD
HCT: 27 % — ABNORMAL LOW (ref 36.0–46.0)
Hemoglobin: 8.4 g/dL — ABNORMAL LOW (ref 12.0–15.0)

## 2019-08-07 LAB — HAPTOGLOBIN: Haptoglobin: 85 mg/dL (ref 37–355)

## 2019-08-07 LAB — VANCOMYCIN, RANDOM: Vancomycin Rm: 23

## 2019-08-07 MED ORDER — NADOLOL 20 MG PO TABS
20.0000 mg | ORAL_TABLET | Freq: Every day | ORAL | Status: DC
  Administered 2019-08-07 – 2019-08-13 (×6): 20 mg via ORAL
  Filled 2019-08-07 (×7): qty 1

## 2019-08-07 MED ORDER — SODIUM CHLORIDE 0.9 % IV SOLN: INTRAVENOUS | Status: DC

## 2019-08-07 MED ORDER — VANCOMYCIN VARIABLE DOSE PER UNSTABLE RENAL FUNCTION (PHARMACIST DOSING): Status: DC

## 2019-08-07 MED ORDER — AMLODIPINE BESYLATE 10 MG PO TABS
10.0000 mg | ORAL_TABLET | Freq: Every day | ORAL | Status: DC
  Administered 2019-08-07 – 2019-08-10 (×4): 10 mg via ORAL
  Filled 2019-08-07 (×4): qty 1

## 2019-08-07 MED ORDER — VANCOMYCIN HCL IN DEXTROSE 1-5 GM/200ML-% IV SOLN
1000.0000 mg | Freq: Once | INTRAVENOUS | Status: AC
  Administered 2019-08-07: 1000 mg via INTRAVENOUS
  Filled 2019-08-07: qty 200

## 2019-08-07 NOTE — Progress Notes (Signed)
[facility]                                                                              Patient Demographics  Heather Petty, is a 69 y.o. female, DOB - 12/10/1950, KKO:[id number]  Admit date - 08/05/2019   Admitting Physician Shela Leff, MD  Outpatient Primary MD for the patient is [name], MD  Outpatient specialists:   LOS - 2  days   Medical records reviewed and are as summarized below:    Chief Complaint  Patient presents with  . Abnormal Lab  . Abdominal Pain       Brief summary   Heather Petty is a 69 y.o. female with history of asthma, depression, insulin-dependent type 2 diabetes, gout, hypertension, hyperlipidemia, morbid obesity (BMI 45.82), schizophrenia, CVA presenting from [facility] for evaluation of low hemoglobin (5.6), diarrhea, abdominal pain, and shortness of breath.  Patient was recently admitted to the hospital 6/14-6/24 for sepsis secondary to MRSA and enterococcal bacteremia.  Source felt to be secondary to UTI and infected sacral decubitus ulcer.  Urine culture grew Klebsiella.  No valvular vegetations on TTE.  Infectious disease was consulted and recommended TEE which the patient refused.  PICC line was placed on 6/22 and she was discharged with plan to continue IV vancomycin for total of 6 weeks until 7/26. Patient reported that she was sent here from [facility] because her hemoglobin was low.  She has had nonbloody diarrhea for the past 2 days.  No nausea or vomiting.  She had some mild lower abdominal discomfort earlier which has now resolved.  Denies chest pain or shortness of breath.  No other complaints   Assessment & Plan    Principal Problem:   Diarrhea -Presented with diarrhea however abdominal exam benign, no fever or leukocytosis.  She has been on vancomycin till 7/26 after being recently diagnosed with MRSA and enterococcal bacteremia.  Patient reported no GI bleeding -Per RN, no further diarrhea,  patient had 1 BM yesterday, which was formed.  Stool studies still pending.  Active problems Dyspnea -Per admission note, shortness of breath was reported however chest x-ray showed no active disease, possibly due to anemia -Resolved, currently on room air  Acute on chronic normocytic anemia -Hemoglobin 6.7 at the time of admission was 8.4 at the time of recent hospital discharge, baseline around 10 -FOBT negative, received a total of 2 units packed RBC transfusion -Anemia panel consistent with anemia of chronic disease, may have bone marrow depression due to ongoing antibiotics and chronic illness -H&H currently stable  Recent MRSA and enterococcal bacteremia -TTE did not reveal any valvular vegetations, ID had recommended TEE which patient had refused during previous admission -Blood cultures to 6/18 showed no growth, has a right upper extremity PICC line -Continue IV vancomycin for total of 6 weeks until 7/26, per pharmacy consult  Sinus bradycardia, hypertension -Heart rate improved, BP now elevated,  nadolol  Acute kidney injury -Baseline creatinine 0.6-0.7, likely prerenal due to dehydration, in the setting of diarrhea and anemia.  On ACE inhibitor, vancomycin -Continue to hold benazepril.  Patient was placed on IV fluid hydration, creatinine  improving, 1.4 still not at baseline   Elevated LFTs -Unclear etiology, alk phos 440, total bili 2.2, ALT 78, AST 36, lipase normal -Abdominal ultrasound 6/29 showed no acute abnormality, hepatic steatosis -Patient had CT chest abdomen pelvis on 6/14 which had shown no definite acute intrathoracic, abdominal or pelvic pathology, no acute or significant osseous findings.  LFTs improving  Poorly controlled type 2 diabetes mellitus, IDDM with hyperglycemia -Hemoglobin A1c 10.4 on 6/15 BG is improving, continue sliding scale insulin, Lantus, hold glipizide  Hyperlipidemia Holding statin due to transaminitis    Pressure Injury  Documentation: -Sacral decub stage I, wound care consulted, skin intact, except for pinpoint area on the right buttock   Obesity Estimated body mass index is 45.82 kg/m as calculated from the following:   Height as of 07/21/19: 5' 1"  (1.549 m).   Weight as of this encounter: 110 kg.  Code Status: Full CODE STATUS DVT Prophylaxis:  Lovenox  Family Communication: Discussed all imaging results, lab results, explained to the patient   Disposition Plan:     Status is: Inpatient  Remains inpatient appropriate because:Inpatient level of care appropriate due to severity of illness   Dispo: The patient is from: SNF              Anticipated d/c is to: SNF              Anticipated d/c date is: 2 days              Patient currently is not medically stable to d/c.  Acute kidney injury, anemia, creatinine still not at baseline, hopefully DC back to SNF in a.m if creatinine improving.  Time Spent in minutes 35 minutes  Procedures:  None  Consultants:   None  Antimicrobials:   Anti-infectives (From admission, onward)   Start     Dose/Rate Route Frequency Ordered Stop   08/07/19 0832  vancomycin variable dose per unstable renal function (pharmacist dosing)     Discontinue      Does not apply See admin instructions 08/07/19 0832     08/06/19 1330  vancomycin (VANCOREADY) IVPB 1250 mg/250 mL        1,250 mg 166.7 mL/hr over 90 Minutes Intravenous  Once 08/06/19 1231 08/06/19 1753         Medications  Scheduled Meds: . sodium chloride   Intravenous Once  . allopurinol  300 mg Oral Daily  . vitamin C  500 mg Oral BID  . B-complex with vitamin C  1 tablet Oral Daily  . Chlorhexidine Gluconate Cloth  6 each Topical Daily  . doxepin  25 mg Oral QHS  . enoxaparin (LOVENOX) injection  40 mg Subcutaneous Q24H  . famotidine  20 mg Oral BID  . folic acid  1 mg Oral Daily  . insulin aspart  0-15 Units Subcutaneous TID WC  . insulin aspart  0-5 Units Subcutaneous QHS  . insulin glargine   20 Units Subcutaneous Daily  . vancomycin variable dose per unstable renal function (pharmacist dosing)   Does not apply See admin instructions   Continuous Infusions: PRN Meds:.acetaminophen **OR** acetaminophen, azelastine **AND** fluticasone, bisacodyl, diphenhydrAMINE-zinc acetate      Subjective:   Heather Petty was seen and examined today.  Per patient,  no diarrhea, had 1 BM which was formed yesterday.  No bleeding.  No chest pain or shortness of breath, nausea vomiting or abdominal pain.    Objective:   Vitals:   08/07/19 6568 08/07/19 0351 08/07/19 0818  08/07/19 1227  BP: (!) 131/55 (!) 136/50 (!) 143/70 (!) 170/67  Pulse: 70 67 67 66  Resp: (!) 29 (!) 22 20 (!) 21  Temp: 99 F (37.2 C) 98.9 F (37.2 C) 98.5 F (36.9 C) 98.4 F (36.9 C)  TempSrc: Axillary Oral Oral Oral  SpO2: 97% 93% 96% 95%  Weight:        Intake/Output Summary (Last 24 hours) at 08/07/2019 1359 Last data filed at 08/07/2019 1100 Gross per 24 hour  Intake 725 ml  Output 1200 ml  Net -475 ml     Wt Readings from Last 3 Encounters:  08/05/19 110 kg  07/21/19 110 kg  07/21/18 106.3 kg    Physical Exam  General: Alert and oriented x 3, NAD  Cardiovascular: S1 S2 clear, RRR. No pedal edema b/l  Respiratory: CTAB, no wheezing, rales or rhonchi  Gastrointestinal: Soft, nontender, nondistended, NBS  Ext: no pedal edema bilaterally  Neuro: no new deficits  Musculoskeletal: No cyanosis, clubbing  Skin: No rashes  Psych: Normal affect and demeanor, alert and oriented x3    Data Reviewed:  I have personally reviewed following labs and imaging studies  Micro Results Recent Results (from the past 240 hour(s))  SARS CORONAVIRUS 2 (TAT 6-24 HRS) Nasopharyngeal Nasopharyngeal Swab     Status: None   Collection Time: 07/31/19 11:45 AM   Specimen: Nasopharyngeal Swab  Result Value Ref Range Status   SARS Coronavirus 2 NEGATIVE NEGATIVE Final    Comment: (NOTE) SARS-CoV-2 target  nucleic acids are NOT DETECTED.  The SARS-CoV-2 RNA is generally detectable in upper and lower respiratory specimens during the acute phase of infection. Negative results do not preclude SARS-CoV-2 infection, do not rule out co-infections with other pathogens, and should not be used as the sole basis for treatment or other patient management decisions. Negative results must be combined with clinical observations, patient history, and epidemiological information. The expected result is Negative.  Fact Sheet for Patients: SugarRoll.be  Fact Sheet for Healthcare Providers: https://www.woods-mathews.com/  This test is not yet approved or cleared by the [place] FDA and  has been authorized for detection and/or diagnosis of SARS-CoV-2 by FDA under an Emergency Use Authorization (EUA). This EUA will remain  in effect (meaning this test can be used) for the duration of the COVID-19 declaration under Se ction 564(b)(1) of the Act, 21 U.S.C. section 360bbb-3(b)(1), unless the authorization is terminated or revoked sooner.  Performed at [facility], [place] [address], [place], [place] [address]   SARS Coronavirus 2 by RT PCR (hospital order, performed in [facility] hospital lab) Nasopharyngeal Nasopharyngeal Swab     Status: None   Collection Time: 08/05/19  7:50 PM   Specimen: Nasopharyngeal Swab  Result Value Ref Range Status   SARS Coronavirus 2 NEGATIVE NEGATIVE Final    Comment: (NOTE) SARS-CoV-2 target nucleic acids are NOT DETECTED.  The SARS-CoV-2 RNA is generally detectable in upper and lower respiratory specimens during the acute phase of infection. The lowest concentration of SARS-CoV-2 viral copies this assay can detect is 250 copies / mL. A negative result does not preclude SARS-CoV-2 infection and should not be used as the sole basis for treatment or other patient management decisions.  A negative result may occur  with improper specimen collection / handling, submission of specimen other than nasopharyngeal swab, presence of viral mutation(s) within the areas targeted by this assay, and inadequate number of viral copies (<250 copies / mL). A negative result must  be combined with clinical observations, patient history, and epidemiological information.  Fact Sheet for Patients:   StrictlyIdeas.no  Fact Sheet for Healthcare Providers: BankingDealers.co.za  This test is not yet approved or  cleared by the [place] FDA and has been authorized for detection and/or diagnosis of SARS-CoV-2 by FDA under an Emergency Use Authorization (EUA).  This EUA will remain in effect (meaning this test can be used) for the duration of the COVID-19 declaration under Section 564(b)(1) of the Act, 21 U.S.C. section 360bbb-3(b)(1), unless the authorization is terminated or revoked sooner.  Performed at [facility], [place] [address], [place], [place] [address]     Radiology Reports CT ABDOMEN PELVIS WO CONTRAST  Addendum Date: 07/22/2019   ADDENDUM REPORT: 07/22/2019 00:43 ADDENDUM: Along the posterior left subcutaneous soft tissues overlying the iliac there is a focal area of ulceration and fat stranding changes. No loculated fluid collections or subcutaneous emphysema. No cortical destruction or bony erosion however seen at the posterior iliac. Electronically Signed   By: Prudencio Pair M.D.   On: 07/22/2019 00:43   Result Date: 07/22/2019 CLINICAL DATA:  Fever of unknown origin EXAM: CT CHEST, abdomen, and pelvis WITHOUT CONTRAST TECHNIQUE: Multidetector CT imaging of the chest, abdomen and pelvis was performed following the standard protocol without IV contrast. COMPARISON:  July 19, 2018 FINDINGS: Cardiovascular: Scattered aortic atherosclerosis is noted. Coronary artery calcifications are seen. The heart size is normal. There is no pericardial thickening or  effusion. Mediastinum/Nodes: There are no enlarged mediastinal, hilar or axillary lymph nodes. The thyroid gland, trachea and esophagus demonstrate no significant findings. Lungs/Pleura: Minimal streaky atelectasis seen at both lung bases. Small amount of tree-in-bud opacity seen at the posterior periphery of the right lung base as on the prior exam. There is a small 5 mm pulmonary nodule at the right lung base which was partially visualized on prior exam. No pleural effusion Upper abdomen: The visualized portion of the upper abdomen is unremarkable. Musculoskeletal/Chest wall: There is no chest wall mass or suspicious osseous finding. 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 Abdomen/pelvis: Hepatobiliary: There is diffuse low density seen throughout the liver parenchyma. No focal hepatic lesion although limited due to the lack of intravenous contrast. Layering hyperdense sludge/small stones are noted. Ill intrahepatic biliary ductal dilatation. Pancreas: Unremarkable.  No surrounding inflammatory changes. Spleen: Normal in size. Although limited due to the lack of intravenous contrast, normal in appearance. Adrenals/Urinary Tract: Both adrenal glands appear normal. The kidneys and collecting system appear normal without evidence of urinary tract calculus or hydronephrosis. Bladder is unremarkable. Stomach/Bowel: The stomach, small bowel, and colon are normal in appearance. No inflammatory changes or obstructive findings. Diverticulosis without diverticulitis is seen. The appendix is unremarkable. Vascular/Lymphatic: There are no enlarged abdominal or pelvic lymph nodes. Scattered aortic atherosclerotic calcifications are seen without aneurysmal dilatation. Reproductive: The uterus and adnexa are unremarkable. Other: No evidence of abdominal wall mass or hernia. Musculoskeletal: No acute or significant osseous findings.  IMPRESSION: 1. No definite acute intrathoracic, abdominal, or pelvic pathology to explain the patient's symptoms. 2. Unchanged minimal tree-in-bud opacities at the posterior periphery of the right lung base, likely due to chronic inflammatory process. 3.  Cholelithiasis without evidence of acute cholecystitis. 4. 5 mm pulmonary nodule at the right lung base. No follow-up recommended. This recommendation follows the consensus statement: Guidelines for Management of Incidental Pulmonary Nodules Detected on CT Images: From the Fleischner  Society 2017; Radiology 2017; 284:228-243. 5.  Aortic Atherosclerosis (ICD10-I70.0). Electronically Signed: By: Prudencio Pair M.D. On: 07/22/2019 00:12   DG Chest 2 View  Result Date: 07/21/2019 CLINICAL DATA:  Suspected sepsis. EXAM: CHEST - 2 VIEW COMPARISON:  Radiograph 07/19/2018 FINDINGS: Low lung volumes. Mild cardiomegaly. Aortic atherosclerosis. Peribronchial thickening. There are streaky bibasilar opacities. No confluent airspace disease. No significant pleural effusion. No pneumothorax. No acute osseous abnormalities are seen. IMPRESSION: 1. Low lung volumes with streaky bibasilar opacities, favoring atelectasis. 2. Peribronchial thickening. 3. Mild cardiomegaly. Electronically Signed   By: Keith Rake M.D.   On: 07/21/2019 22:46   DG Shoulder Right  Result Date: 07/24/2019 CLINICAL DATA:  Unwitnessed fall EXAM: RIGHT SHOULDER - 2+ VIEW COMPARISON:  None. FINDINGS: Degenerative changes in the right [facility] and glenohumeral joints with joint space narrowing and spurring. No acute bony abnormality. Specifically, no fracture, subluxation, or dislocation. IMPRESSION: Degenerative changes.  No acute bony abnormality. Electronically Signed   By: Rolm Baptise M.D.   On: 07/24/2019 20:55   CT Head Wo Contrast  Result Date: 07/21/2019 CLINICAL DATA:  Altered mental status. EXAM: CT HEAD WITHOUT CONTRAST TECHNIQUE: Contiguous axial images were obtained from the base of the  skull through the vertex without intravenous contrast. COMPARISON:  Head CT 05/07/2018 FINDINGS: Brain: No intracranial hemorrhage, mass effect, or midline shift. Normal brain volume for age. No hydrocephalus. The basilar cisterns are patent. Mild chronic small vessel ischemia. No evidence of territorial infarct or acute ischemia. No extra-axial or intracranial fluid collection. Vascular: Atherosclerosis of skullbase vasculature without hyperdense vessel or abnormal calcification. Skull: No fracture or focal lesion. Sinuses/Orbits: Paranasal sinuses and mastoid air cells are clear. The visualized orbits are unremarkable. Other: None. IMPRESSION: 1. No acute intracranial abnormality. 2. Mild chronic small vessel ischemia. Electronically Signed   By: Keith Rake M.D.   On: 07/21/2019 22:59   CT Chest Wo Contrast  Addendum Date: 07/22/2019   ADDENDUM REPORT: 07/22/2019 00:43 ADDENDUM: Along the posterior left subcutaneous soft tissues overlying the iliac there is a focal area of ulceration and fat stranding changes. No loculated fluid collections or subcutaneous emphysema. No cortical destruction or bony erosion however seen at the posterior iliac. Electronically Signed   By: Prudencio Pair M.D.   On: 07/22/2019 00:43   Result Date: 07/22/2019 CLINICAL DATA:  Fever of unknown origin EXAM: CT CHEST, abdomen, and pelvis WITHOUT CONTRAST TECHNIQUE: Multidetector CT imaging of the chest, abdomen and pelvis was performed following the standard protocol without IV contrast. COMPARISON:  July 19, 2018 FINDINGS: Cardiovascular: Scattered aortic atherosclerosis is noted. Coronary artery calcifications are seen. The heart size is normal. There is no pericardial thickening or effusion. Mediastinum/Nodes: There are no enlarged mediastinal, hilar or axillary lymph nodes. The thyroid gland, trachea and esophagus demonstrate no significant findings. Lungs/Pleura: Minimal streaky atelectasis seen at both lung bases. Small  amount of tree-in-bud opacity seen at the posterior periphery of the right lung base as on the prior exam. There is a small 5 mm pulmonary nodule at the right lung base which was partially visualized on prior exam. No pleural effusion Upper abdomen: The visualized portion of the upper abdomen is unremarkable. Musculoskeletal/Chest wall: There is no chest wall mass or suspicious osseous finding. 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 Abdomen/pelvis: Hepatobiliary: There is diffuse low density seen throughout the liver parenchyma. No focal hepatic lesion although limited due to the lack  of intravenous contrast. Layering hyperdense sludge/small stones are noted. Ill intrahepatic biliary ductal dilatation. Pancreas: Unremarkable.  No surrounding inflammatory changes. Spleen: Normal in size. Although limited due to the lack of intravenous contrast, normal in appearance. Adrenals/Urinary Tract: Both adrenal glands appear normal. The kidneys and collecting system appear normal without evidence of urinary tract calculus or hydronephrosis. Bladder is unremarkable. Stomach/Bowel: The stomach, small bowel, and colon are normal in appearance. No inflammatory changes or obstructive findings. Diverticulosis without diverticulitis is seen. The appendix is unremarkable. Vascular/Lymphatic: There are no enlarged abdominal or pelvic lymph nodes. Scattered aortic atherosclerotic calcifications are seen without aneurysmal dilatation. Reproductive: The uterus and adnexa are unremarkable. Other: No evidence of abdominal wall mass or hernia. Musculoskeletal: No acute or significant osseous findings. IMPRESSION: 1. No definite acute intrathoracic, abdominal, or pelvic pathology to explain the patient's symptoms. 2. Unchanged minimal tree-in-bud opacities at the posterior periphery of the right lung base, likely due to chronic inflammatory  process. 3.  Cholelithiasis without evidence of acute cholecystitis. 4. 5 mm pulmonary nodule at the right lung base. No follow-up recommended. This recommendation follows the consensus statement: Guidelines for Management of Incidental Pulmonary Nodules Detected on CT Images: From the Fleischner Society 2017; Radiology 2017; [id number]. 5.  Aortic Atherosclerosis (ICD10-I70.0). Electronically Signed: By: Prudencio Pair M.D. On: 07/22/2019 00:12   DG Chest Port 1 View  Result Date: 08/05/2019 CLINICAL DATA:  Shortness of breath EXAM: PORTABLE CHEST 1 VIEW COMPARISON:  07/23/2019 FINDINGS: No focal opacity or pleural effusion. Stable cardiomediastinal silhouette with aortic atherosclerosis. No pneumothorax. Right upper extremity central venous catheter with tip over the SVC. IMPRESSION: No active disease. Electronically Signed   By: Donavan Foil M.D.   On: 08/05/2019 20:28   DG CHEST PORT 1 VIEW  Result Date: 07/23/2019 CLINICAL DATA:  Tachypnea EXAM: PORTABLE CHEST 1 VIEW COMPARISON:  07/21/2019 FINDINGS: The heart size and mediastinal contours are within normal limits. Aortic atherosclerosis. Both lungs are clear. The visualized skeletal structures are unremarkable. IMPRESSION: No active disease. Electronically Signed   By: Donavan Foil M.D.   On: 07/23/2019 19:39   DG Humerus Right  Result Date: 07/24/2019 CLINICAL DATA:  Unwitnessed fall EXAM: RIGHT HUMERUS - 2+ VIEW COMPARISON:  Shoulder series today FINDINGS: Degenerative changes in the right AC and glenohumeral joints. No acute bony abnormality. Specifically, no fracture, subluxation, or dislocation. IMPRESSION: No acute bony abnormality. Electronically Signed   By: Rolm Baptise M.D.   On: 07/24/2019 20:56   ECHOCARDIOGRAM COMPLETE  Result Date: 07/23/2019    ECHOCARDIOGRAM REPORT   Patient Name:   MERIEM LEMIEUX Date of Exam: 07/23/2019 Medical Rec #:  [id number]        Height:       61.0 in Accession #:    [id number]       Weight:        242.5 lb Date of Birth:  [birth year]        BSA:          2.050 m Patient Age:    45 years         BP:           135/56 mmHg Patient Gender: F                HR:           84 bpm. Exam Location:  Inpatient Procedure: 2D Echo, Cardiac Doppler and Color Doppler Indications:    Bacteremia  History:  Patient has prior history of Echocardiogram examinations, most                 recent 07/20/2018. Risk Factors:Diabetes, Hypertension,                 Dyslipidemia and Former Smoker. MRSA bacteremia.  Sonographer:    Clayton Lefort RDCS (AE) Referring Phys: [place]  1. Left ventricular ejection fraction, by estimation, is 65 to 70%. The left ventricle has normal function. The left ventricle has no regional wall motion abnormalities. There is mild concentric left ventricular hypertrophy. Left ventricular diastolic parameters are consistent with Grade I diastolic dysfunction (impaired relaxation).  2. Right ventricular systolic function is normal. The right ventricular size is normal. There is normal pulmonary artery systolic pressure. The estimated right ventricular systolic pressure is 89.2 mmHg.  3. The mitral valve is normal in structure. No evidence of mitral valve regurgitation. No evidence of mitral stenosis.  4. The aortic valve has an indeterminant number of cusps. Aortic valve regurgitation is not visualized. Mild to moderate aortic valve sclerosis/calcification is present, without any evidence of aortic stenosis.  5. The inferior vena cava is normal in size with greater than 50% respiratory variability, suggesting right atrial pressure of 3 mmHg. Conclusion(s)/Recommendation(s): No evidence of valvular vegetations on this transthoracic echocardiogram. Would recommend a transesophageal echocardiogram to exclude infective endocarditis if clinically indicated. FINDINGS  Left Ventricle: Left ventricular ejection fraction, by estimation, is 65 to 70%. The left ventricle has normal function. The  left ventricle has no regional wall motion abnormalities. The left ventricular internal cavity size was normal in size. There is  mild concentric left ventricular hypertrophy. Left ventricular diastolic parameters are consistent with Grade I diastolic dysfunction (impaired relaxation). Normal left ventricular filling pressure. Right Ventricle: The right ventricular size is normal. No increase in right ventricular wall thickness. Right ventricular systolic function is normal. There is normal pulmonary artery systolic pressure. The tricuspid regurgitant velocity is 2.48 m/s, and  with an assumed right atrial pressure of 3 mmHg, the estimated right ventricular systolic pressure is 11.9 mmHg. Left Atrium: Left atrial size was normal in size. Right Atrium: Right atrial size was normal in size. Pericardium: There is no evidence of pericardial effusion. Mitral Valve: The mitral valve is normal in structure. Normal mobility of the mitral valve leaflets. Mild mitral annular calcification. No evidence of mitral valve regurgitation. No evidence of mitral valve stenosis. MV peak gradient, 3.5 mmHg. The mean mitral valve gradient is 2.0 mmHg. Tricuspid Valve: The tricuspid valve is normal in structure. Tricuspid valve regurgitation is trivial. No evidence of tricuspid stenosis. Aortic Valve: The aortic valve has an indeterminant number of cusps. Aortic valve regurgitation is not visualized. Mild to moderate aortic valve sclerosis/calcification is present, without any evidence of aortic stenosis. Aortic valve mean gradient measures 5.0 mmHg. Aortic valve peak gradient measures 12.7 mmHg. Aortic valve area, by VTI measures 2.20 cm. Pulmonic Valve: The pulmonic valve was normal in structure. Pulmonic valve regurgitation is not visualized. No evidence of pulmonic stenosis. Aorta: The aortic root is normal in size and structure. Venous: The inferior vena cava is normal in size with greater than 50% respiratory variability,  suggesting right atrial pressure of 3 mmHg. IAS/Shunts: The interatrial septum appears to be lipomatous. No atrial level shunt detected by color flow Doppler.  LEFT VENTRICLE PLAX 2D LVIDd:         3.50 cm  Diastology LVIDs:  2.20 cm  LV e' lateral:   8.38 cm/s LV PW:         1.20 cm  LV E/e' lateral: 9.9 LV IVS:        1.10 cm  LV e' medial:    7.94 cm/s LVOT diam:     1.70 cm  LV E/e' medial:  10.4 LV SV:         64 LV SV Index:   31 LVOT Area:     2.27 cm  RIGHT VENTRICLE             IVC RV Basal diam:  3.00 cm     IVC diam: 1.50 cm RV S prime:     11.00 cm/s TAPSE (M-mode): 1.6 cm LEFT ATRIUM             Index       RIGHT ATRIUM           Index LA diam:        2.40 cm 1.17 cm/m  RA Area:     11.10 cm LA Vol (A2C):   54.7 ml 26.68 ml/m RA Volume:   22.60 ml  11.02 ml/m LA Vol (A4C):   36.9 ml 18.00 ml/m LA Biplane Vol: 45.4 ml 22.15 ml/m  AORTIC VALVE AV Area (Vmax):    2.10 cm AV Area (Vmean):   2.23 cm AV Area (VTI):     2.20 cm AV Vmax:           178.00 cm/s AV Vmean:          109.000 cm/s AV VTI:            0.293 m AV Peak Grad:      12.7 mmHg AV Mean Grad:      5.0 mmHg LVOT Vmax:         165.00 cm/s LVOT Vmean:        107.000 cm/s LVOT VTI:          0.284 m LVOT/AV VTI ratio: 0.97  AORTA Ao Root diam: 2.60 cm Ao Asc diam:  3.20 cm MITRAL VALVE                TRICUSPID VALVE MV Area (PHT): 2.69 cm     TR Peak grad:   24.6 mmHg MV Peak grad:  3.5 mmHg     TR Vmax:        248.00 cm/s MV Mean grad:  2.0 mmHg MV Vmax:       0.94 m/s     SHUNTS MV Vmean:      58.4 cm/s    Systemic VTI:  0.28 m MV Decel Time: 282 msec     Systemic Diam: 1.70 cm MV E velocity: 82.60 cm/s MV A velocity: 102.00 cm/s MV E/A ratio:  0.81 Fransico Him MD Electronically signed by Fransico Him MD Signature Date/Time: 07/23/2019/4:37:10 PM    Final    [place] EKG SITE RITE  Result Date: 07/28/2019 If Site Rite image not attached, placement could not be confirmed due to current cardiac rhythm.  US Abdomen Limited  RUQ  Result Date: 08/05/2019 CLINICAL DATA:  Elevated LFTs EXAM: ULTRASOUND ABDOMEN LIMITED RIGHT UPPER QUADRANT COMPARISON:  CT dated July 21, 2019 FINDINGS: Gallbladder: No gallstones or wall thickening visualized. No sonographic Murphy sign noted by sonographer. Common bile duct: Diameter: 6 mm Liver: Diffuse increased echogenicity with slightly heterogeneous liver. Appearance typically secondary to fatty infiltration. Fibrosis secondary consideration. No secondary findings of cirrhosis noted.  No focal hepatic lesion or intrahepatic biliary duct dilatation. Portal vein is patent on color Doppler imaging with normal direction of blood flow towards the liver. Other: None. IMPRESSION: 1. No acute abnormality. 2. Hepatic steatosis. Electronically Signed   By: Constance Holster M.D.   On: 08/05/2019 22:30    Lab Data:  CBC: Recent Labs  Lab 08/05/19 1329 08/06/19 0054 08/07/19 0007 08/07/19 0316  WBC 5.7  --   --  6.4  HGB 6.7* 7.4* 8.4* 8.9*  HCT 22.5* 24.4* 27.0* 27.8*  MCV 98.3  --   --  93.9  PLT 229  --   --  468   Basic Metabolic Panel: Recent Labs  Lab 08/05/19 1329 08/06/19 0054 08/07/19 0316  NA 141 140 140  K 4.0 4.0 4.2  CL 112* 113* 113*  CO2 21* 21* 21*  GLUCOSE 232* 225* 88  BUN 22 23 22   CREATININE 1.51* 1.58* 1.42*  CALCIUM 8.3* 8.4* 8.3*   GFR: Estimated Creatinine Clearance: 42.3 mL/min (A) (by C-G formula based on SCr of 1.42 mg/dL (H)). Liver Function Tests: Recent Labs  Lab 08/05/19 1329 08/06/19 0054 08/07/19 0316  AST 36 24 18  ALT 78* 66* 45*  ALKPHOS 440* 370* 272*  BILITOT 2.0* 2.1* 1.9*  PROT 5.9* 5.7* 5.7*  ALBUMIN 2.6* 2.5* 2.5*   Recent Labs  Lab 08/05/19 1329  LIPASE 42   No results for input(s): AMMONIA in the last 168 hours. Coagulation Profile: No results for input(s): INR, PROTIME in the last 168 hours. Cardiac Enzymes: No results for input(s): CKTOTAL, CKMB, CKMBINDEX, TROPONINI in the last 168 hours. BNP (last 3  results) No results for input(s): PROBNP in the last 8760 hours. HbA1C: No results for input(s): HGBA1C in the last 72 hours. CBG: Recent Labs  Lab 08/06/19 1122 08/06/19 1632 08/06/19 2107 08/07/19 0606 08/07/19 1225  GLUCAP 113* 173* 165* 86 160*   Lipid Profile: No results for input(s): CHOL, HDL, LDLCALC, TRIG, CHOLHDL, LDLDIRECT in the last 72 hours. Thyroid Function Tests: No results for input(s): TSH, T4TOTAL, FREET4, T3FREE, THYROIDAB in the last 72 hours. Anemia Panel: Recent Labs    08/06/19 0054  VITAMINB12 1,281*  FOLATE 16.7  FERRITIN 1,095*  TIBC 326  IRON 63  RETICCTPCT 1.7   Urine analysis:    Component Value Date/Time   COLORURINE AMBER (A) 07/28/2019 1236   APPEARANCEUR CLEAR 07/28/2019 1236   LABSPEC 1.014 07/28/2019 1236   PHURINE 7.0 07/28/2019 1236   GLUCOSEU NEGATIVE 07/28/2019 1236   HGBUR NEGATIVE 07/28/2019 1236   BILIRUBINUR NEGATIVE 07/28/2019 1236   KETONESUR NEGATIVE 07/28/2019 1236   PROTEINUR 30 (A) 07/28/2019 1236   UROBILINOGEN 0.2 12/27/2010 1858   NITRITE NEGATIVE 07/28/2019 1236   LEUKOCYTESUR NEGATIVE 07/28/2019 1236     Ousman Dise M.D. [facility] 08/07/2019, 1:59 PM   Call night coverage person covering after 7pm

## 2019-08-07 NOTE — Progress Notes (Signed)
Pharmacy Antibiotic Note  Heather Petty is a 69 y.o. female admitted on 08/05/2019 with low hemoglobin. Pt had recent hospital admission for DKA, 07/21/19- 08/01/19 and was discharged 08/01/19 to SNF on vancomycin 1 gm IV q12hr x 6 wks, end date 7/26/21or MRSA and enterococcal bacteremia, secondary to infected sacral decubitus ulcer and UTI. She was readmitted 6/29 AM due to low Hgb, diarrhea, abdominal pain, and shortness of breath.   Pharmacy has been consulted to resume vancomycin dosing for recent MRSA and enterococcal bacteremia, for 6 weeks total, with end date of 09/01/19.   Pt also presented with diarrhea; however, abd exam benign, no fever or leukocytosis; stool studies pending  PTA vancomycin regimen: 1g IV q12hr to continue for 6 weeks, with end date 09/01/19 (per SNF MAR last given PTA on 6/27 at 22:27 PM, so pt rec'd no vancomycin on 6/28, 6/29)  Vancomycin trough on 6/23 (previous admission) was 20 mg/L on 1 g q 12 hrs regimen (Scr stable at 0.7 on 6/23). Pt admitted with AKI on 08/05/19, with SCr 1.51 > 1.58>1.42>1.30 (this afternoon) (baseline SCr 0.6-0.7).   Vancomycin random level at 0906 AM on 08/06/19 was 19 mg/L. Pt rec'd vancomycin 1250 mg IV X 1 yesterday at 1623 PM. Vancomycin random level today at 1653 PM (~24 hrs after vancomycin dose) was 23 mg/L. Scr drawn at same time as vancomycin random level was down to 1.30; CrCl ~46.2 ml/min (renal function improving, but not stable, at this point).   WBC 6.4, afebrile  Plan: Vancomycin 1000 mg IV X 1 today at 2200 PM (estimate vancomycin level to be <20 mg/L at that point, given current CrCl of 46.2 ml/min), then check 12-hr vancomycin random level, Scr to determine subsequent dosing strategy Goal vancomycin trough level: 15-20 mg/L Vanc to continue thru 09/01/19 (6 weeks total) Monitor WBC, temp, clinical progress, cultures, renal function, vancomycin levels as indicated  Weight: 110 kg (242 lb 8.1 oz)  Temp (24hrs), Avg:98.8 F  (37.1 C), Min:98.4 F (36.9 C), Max:99.3 F (37.4 C)  Recent Labs  Lab 08/05/19 1329 08/06/19 0054 08/06/19 0906 08/07/19 0316 08/07/19 1653  WBC 5.7  --   --  6.4  --   CREATININE 1.51* 1.58*  --  1.42* 1.30*  VANCORANDOM  --   --  19  --  23    Estimated Creatinine Clearance: 46.2 mL/min (A) (by C-G formula based on SCr of 1.3 mg/dL (H)).    Allergies  Allergen Reactions  . Lasix [Furosemide] Other (See Comments)    IV-LASIX ==> Reaction: Burning    Antimicrobials this admission: Vancomycin started on previous admission 6/14>> (Planned end date 7.26.21)  (per SNF MAR, LD given 6/27 2230, thus none on 6/28, 6/29)    Microbiology results: 6/29 COVID: negative  Previous admission microbiology results:  6/14 BCID: MRSA, Enterococcus, staph hominis 6/14 Ucx: 80,000 Klebsiella pneumoniae (susceptible to all abx tested except ampicillin) 6/14 Bcx: 2/2 MRSA per BCID  6/15 Bcx X2: MRSA 6/17 Bcx X 2: MRSA 6/18 Bcx X 2: NG/final 6/21 Ucx: NG/final  Gillermina Hu, PharmD, BCPS, [facility] Clinical Pharmacist 08/07/2019 5:58 PM

## 2019-08-07 NOTE — Plan of Care (Signed)
Pt alert and oriented. Pt on RA. Single Lumen PICC in tact and clean with blood return noted. Skin is dry and peeling. BM on 08/06/19. I unit of blood completed.  Problem: Education: Goal: Knowledge of General Education information will improve Description: Including pain rating scale, medication(s)/side effects and non-pharmacologic comfort measures Outcome: Progressing   Problem: Health Behavior/Discharge Planning: Goal: Ability to manage health-related needs will improve Outcome: Progressing   Problem: Clinical Measurements: Goal: Ability to maintain clinical measurements within normal limits will improve Outcome: Progressing Goal: Will remain free from infection Outcome: Progressing Goal: Diagnostic test results will improve Outcome: Progressing Goal: Respiratory complications will improve Outcome: Progressing Goal: Cardiovascular complication will be avoided Outcome: Progressing   Problem: Activity: Goal: Risk for activity intolerance will decrease Outcome: Progressing   Problem: Nutrition: Goal: Adequate nutrition will be maintained Outcome: Progressing   Problem: Coping: Goal: Level of anxiety will decrease Outcome: Progressing   Problem: Elimination: Goal: Will not experience complications related to bowel motility Outcome: Progressing Goal: Will not experience complications related to urinary retention Outcome: Progressing   Problem: Pain Managment: Goal: General experience of comfort will improve Outcome: Progressing   Problem: Safety: Goal: Ability to remain free from injury will improve Outcome: Progressing   Problem: Skin Integrity: Goal: Risk for impaired skin integrity will decrease Outcome: Progressing

## 2019-08-08 ENCOUNTER — Inpatient Hospital Stay (HOSPITAL_COMMUNITY): Payer: Medicare Other

## 2019-08-08 DIAGNOSIS — D528 Other folate deficiency anemias: Secondary | ICD-10-CM

## 2019-08-08 DIAGNOSIS — I158 Other secondary hypertension: Secondary | ICD-10-CM

## 2019-08-08 DIAGNOSIS — R748 Abnormal levels of other serum enzymes: Secondary | ICD-10-CM

## 2019-08-08 LAB — CREATININE, SERUM
Creatinine, Ser: 1.27 mg/dL — ABNORMAL HIGH (ref 0.44–1.00)
GFR calc Af Amer: 50 mL/min — ABNORMAL LOW (ref >60)
GFR calc non Af Amer: 43 mL/min — ABNORMAL LOW (ref >60)

## 2019-08-08 LAB — COMPREHENSIVE METABOLIC PANEL
ALT: 58 U/L — ABNORMAL HIGH (ref 0–44)
AST: 63 U/L — ABNORMAL HIGH (ref 15–41)
Albumin: 2.3 g/dL — ABNORMAL LOW (ref 3.5–5.0)
Alkaline Phosphatase: 448 U/L — ABNORMAL HIGH (ref 38–126)
Anion gap: 7 (ref 5–15)
BUN: 15 mg/dL (ref 8–23)
CO2: 21 mmol/L — ABNORMAL LOW (ref 22–32)
Calcium: 8.6 mg/dL — ABNORMAL LOW (ref 8.9–10.3)
Chloride: 112 mmol/L — ABNORMAL HIGH (ref 98–111)
Creatinine, Ser: 1.22 mg/dL — ABNORMAL HIGH (ref 0.44–1.00)
GFR calc Af Amer: 52 mL/min — ABNORMAL LOW (ref >60)
GFR calc non Af Amer: 45 mL/min — ABNORMAL LOW (ref >60)
Glucose, Bld: 88 mg/dL (ref 70–99)
Potassium: 3.8 mmol/L (ref 3.5–5.1)
Sodium: 140 mmol/L (ref 135–145)
Total Bilirubin: 3.9 mg/dL — ABNORMAL HIGH (ref 0.3–1.2)
Total Protein: 5.3 g/dL — ABNORMAL LOW (ref 6.5–8.1)

## 2019-08-08 LAB — CBC
HCT: 26.7 % — ABNORMAL LOW (ref 36.0–46.0)
Hemoglobin: 8.6 g/dL — ABNORMAL LOW (ref 12.0–15.0)
MCH: 30.5 pg (ref 26.0–34.0)
MCHC: 32.2 g/dL (ref 30.0–36.0)
MCV: 94.7 fL (ref 80.0–100.0)
Platelets: 196 10*3/uL (ref 150–400)
RBC: 2.82 MIL/uL — ABNORMAL LOW (ref 3.87–5.11)
RDW: 18.4 % — ABNORMAL HIGH (ref 11.5–15.5)
WBC: 4.8 10*3/uL (ref 4.0–10.5)
nRBC: 0 % (ref 0.0–0.2)

## 2019-08-08 LAB — GLUCOSE, CAPILLARY
Glucose-Capillary: 72 mg/dL (ref 70–99)
Glucose-Capillary: 189 mg/dL — ABNORMAL HIGH (ref 70–99)
Glucose-Capillary: 115 mg/dL — ABNORMAL HIGH (ref 70–99)
Glucose-Capillary: 78 mg/dL (ref 70–99)

## 2019-08-08 LAB — VANCOMYCIN, RANDOM: Vancomycin Rm: 27

## 2019-08-08 LAB — PROTIME-INR
INR: 1.2 (ref 0.8–1.2)
Prothrombin Time: 14.3 seconds (ref 11.4–15.2)

## 2019-08-08 LAB — GAMMA GT: GGT: 190 U/L — ABNORMAL HIGH (ref 7–50)

## 2019-08-08 MED ORDER — GADOBUTROL 1 MMOL/ML IV SOLN: 10.0000 mL | Freq: Once | INTRAVENOUS | Status: DC | PRN

## 2019-08-08 MED ORDER — POLYVINYL ALCOHOL 1.4 % OP SOLN
1.0000 [drp] | OPHTHALMIC | Status: DC | PRN
  Administered 2019-08-09 – 2019-08-12 (×2): 1 [drp] via OPHTHALMIC
  Filled 2019-08-08: qty 15

## 2019-08-08 MED ORDER — PANTOPRAZOLE SODIUM 40 MG PO TBEC
40.0000 mg | DELAYED_RELEASE_TABLET | Freq: Every day | ORAL | Status: DC
  Administered 2019-08-08 – 2019-08-13 (×6): 40 mg via ORAL
  Filled 2019-08-08 (×6): qty 1

## 2019-08-08 MED ORDER — PROMETHAZINE HCL 25 MG/ML IJ SOLN
12.5000 mg | Freq: Four times a day (QID) | INTRAMUSCULAR | Status: DC | PRN
  Administered 2019-08-08: 12.5 mg via INTRAVENOUS
  Filled 2019-08-08: qty 1

## 2019-08-08 NOTE — Progress Notes (Signed)
Pharmacy Antibiotic Note  Heather Petty is a 69 y.o. female admitted on 08/05/2019 with low hemoglobin. Pt had recent hospital admission for DKA, 07/21/19- 08/01/19 and was discharged 08/01/19 to SNF on vancomycin 1 gm IV q12hr x 6 wks, end date 7/26/21or MRSA and enterococcal bacteremia, secondary to infected sacral decubitus ulcer and UTI. She was readmitted 6/29 AM due to low Hgb, diarrhea, abdominal pain, and shortness of breath.   Pharmacy has been consulted to resume vancomycin dosing for recent MRSA and enterococcal bacteremia, for 6 weeks total, with end date of 09/01/19.   Pt also presented with diarrhea; however, abd exam benign, no fever or leukocytosis; stool studies pending  PTA vancomycin regimen: 1g IV q12hr to continue for 6 weeks, with end date 09/01/19 (per SNF MAR last given PTA on 6/27 at 22:27 PM, so pt rec'd no vancomycin on 6/28, 6/29)  Vancomycin trough on 6/23 (previous admission) was 20 mg/L on 1 g q 12 hrs regimen (Scr stable at 0.7 on 6/23). Pt admitted with AKI on 08/05/19, with SCr 1.51 > 1.58>1.42>1.30>1.27 (this afternoon) (baseline SCr 0.6-0.7).   Vancomycin random level at 0906 AM on 08/06/19 was 19 mg/L. Pt rec'd vancomycin 1250 mg IV X 1 6/30 at 1623 PM. Vancomycin random level 7/1 at 1653 PM (~24 hrs after vancomycin dose) was 23 mg/L. Scr drawn at same time as vancomycin random level was down to 1.30; CrCl ~46.2 ml/min (renal function improving, but not stable, at this point).  Level drawn AM 7/2 at 1000 (~12 hours from last dose) =27. Scr stable but still 2X baseline. Will get level at ~24 hours (2200 tonight) to calculate elimination rate constant and calculate dose   WBC 4.8, afebrile  Plan: Vancomycin random level at 2200 on 7/2  Goal vancomycin trough level: 15-20 mg/L Vanc to continue thru 09/01/19 (6 weeks total) Monitor WBC, temp, clinical progress, cultures, renal function, vancomycin levels as indicated  Weight: 110 kg (242 lb 8.1 oz)  Temp (24hrs),  Avg:98.5 F (36.9 C), Min:98.3 F (36.8 C), Max:98.9 F (37.2 C)  Recent Labs  Lab 08/05/19 1329 08/05/19 1329 08/06/19 0054 08/06/19 0906 08/07/19 0316 08/07/19 1653 08/08/19 0450 08/08/19 1000  WBC 5.7  --   --   --  6.4  --  4.8  --   CREATININE 1.51*   < > 1.58*  --  1.42* 1.30* 1.22* 1.27*  VANCORANDOM  --   --   --    < >  --  23  --  27   < > = values in this interval not displayed.    Estimated Creatinine Clearance: 47.3 mL/min (A) (by C-G formula based on SCr of 1.27 mg/dL (H)).    Allergies  Allergen Reactions  . Lasix [Furosemide] Other (See Comments)    IV-LASIX ==> Reaction: Burning    Antimicrobials this admission: Vancomycin started on previous admission 6/14>> (Planned end date 7.26.21)  (per SNF MAR, LD given 6/27 2230, thus none on 6/28, 6/29)    Microbiology results: 6/29 COVID: negative  Previous admission microbiology results:  6/14 BCID: MRSA, Enterococcus, staph hominis 6/14 Ucx: 80,000 Klebsiella pneumoniae (susceptible to all abx tested except ampicillin) 6/14 Bcx: 2/2 MRSA per BCID  6/15 Bcx X2: MRSA 6/17 Bcx X 2: MRSA 6/18 Bcx X 2: NG/final 6/21 Ucx: NG/final  Adden Strout A. Levada Dy, PharmD, BCPS, [facility] Clinical Pharmacist [place] Please utilize Amion for appropriate phone number to reach the unit pharmacist (Malcolm)

## 2019-08-08 NOTE — Progress Notes (Signed)
[facility]                                                                              Patient Demographics  Heather Petty, is a 69 y.o. female, DOB - 23-Sep-1950, MAU:[id number]  Admit date - 08/05/2019   Admitting Physician Shela Leff, MD  Outpatient Primary MD for the patient is [name], MD  Outpatient specialists:   LOS - 3  days   Medical records reviewed and are as summarized below:    Chief Complaint  Patient presents with  . Abnormal Lab  . Abdominal Pain       Brief summary   Heather Petty is a 69 y.o. female with history of asthma, depression, insulin-dependent type 2 diabetes, gout, hypertension, hyperlipidemia, morbid obesity (BMI 45.82), schizophrenia, CVA presenting from [facility] for evaluation of low hemoglobin (5.6), diarrhea, abdominal pain, and shortness of breath.  Patient was recently admitted to the hospital 6/14-6/24 for sepsis secondary to MRSA and enterococcal bacteremia.  Source felt to be secondary to UTI and infected sacral decubitus ulcer.  Urine culture grew Klebsiella.  No valvular vegetations on TTE.  Infectious disease was consulted and recommended TEE which the patient refused.  PICC line was placed on 6/22 and she was discharged with plan to continue IV vancomycin for total of 6 weeks until 7/26. Patient reported that she was sent here from [facility] because her hemoglobin was low.  She has had nonbloody diarrhea for the past 2 days.  No nausea or vomiting.  She had some mild lower abdominal discomfort earlier which has now resolved.  Denies chest pain or shortness of breath.  No other complaints   Assessment & Plan    Principal Problem:   Diarrhea -Presented with diarrhea however abdominal exam benign, no fever or leukocytosis.  She has been on vancomycin till 7/26 after being recently diagnosed with MRSA and enterococcal bacteremia.  Patient reported no GI bleeding -Per RN, no further diarrhea,  patient had 1 BM 6/30, which was formed  Active problems Dyspnea -Per admission note, shortness of breath was reported however chest x-ray showed no active disease, possibly due to anemia -Resolved, currently on room air  Acute on chronic normocytic anemia -Hemoglobin 6.7 at the time of admission was 8.4 at the time of recent hospital discharge, baseline around 10 -FOBT negative, received a total of 2 units packed RBC transfusion -Anemia panel consistent with anemia of chronic disease, may have bone marrow depression due to ongoing antibiotics and chronic illness -H&H currently stable, 8.6  Recent MRSA and enterococcal bacteremia -TTE did not reveal any valvular vegetations, ID had recommended TEE which patient had refused during previous admission -Blood cultures to 6/18 showed no growth, has a right upper extremity PICC line -Continue IV vancomycin for total of 6 weeks until 7/26, per pharmacy consult  Sinus bradycardia, hypertension -Heart rate improved, continue nadolol  Acute kidney injury -Baseline creatinine 0.6-0.7, likely prerenal due to dehydration, in the setting of diarrhea and anemia.  On ACE inhibitor, vancomycin -Patient was on colchicine and benazepril, which has been placed on hold  -Creatinine improved to 1.2, will KVO fluids  Elevated LFTs -Unclear etiology, alk phos 440, total bili 2.2, ALT 78, AST 36, lipase normal -Abdominal ultrasound 6/29 showed no acute abnormality, hepatic steatosis -Patient had CT chest abdomen pelvis on 6/14 which had shown no definite acute intrathoracic, abdominal or pelvic pathology, no acute or significant osseous findings.  -Initially trending down on 7/1, alk phos again increased to 448, total bilirubin 3.9 (increased from 1.9), AST, ALT trending up -Unclear etiology, ordered GGT, 5 NT, AMA, PT/INR, may need MRCP, GI consulted -Hold statin, doxepin  Poorly controlled type 2 diabetes mellitus, IDDM with  hyperglycemia -Hemoglobin A1c 10.4 on 6/15 -CBGs improving, continue Lantus, sliding scale insulin, continue to hold glipizide  Hyperlipidemia Holding statin due to transaminitis    Pressure Injury Documentation: -Sacral decub stage I, wound care consulted, skin intact, except for pinpoint area on the right buttock   Obesity Estimated body mass index is 45.82 kg/m as calculated from the following:   Height as of 07/21/19: 5' 1"  (1.549 m).   Weight as of this encounter: 110 kg.  Code Status: Full CODE STATUS DVT Prophylaxis:  Lovenox  Family Communication: Discussed all imaging results, lab results, explained to the patient   Disposition Plan:     Status is: Inpatient  Remains inpatient appropriate because:Inpatient level of care appropriate due to severity of illness   Dispo: The patient is from: SNF              Anticipated d/c is to: SNF              Anticipated d/c date is: 2 days              Patient currently is not medically stable to d/c.  Worsening transaminitis, need further work-up  Time Spent in minutes 35 minutes  Procedures:  None  Consultants:   Gastroenterology  Antimicrobials:   Anti-infectives (From admission, onward)   Start     Dose/Rate Route Frequency Ordered Stop   08/07/19 2200  vancomycin (VANCOCIN) IVPB 1000 mg/200 mL premix        1,000 mg 200 mL/hr over 60 Minutes Intravenous  Once 08/07/19 1837 08/07/19 2328   08/07/19 0832  vancomycin variable dose per unstable renal function (pharmacist dosing)     Discontinue      Does not apply See admin instructions 08/07/19 0832     08/06/19 1330  vancomycin (VANCOREADY) IVPB 1250 mg/250 mL        1,250 mg 166.7 mL/hr over 90 Minutes Intravenous  Once 08/06/19 1231 08/06/19 1753         Medications  Scheduled Meds: . sodium chloride   Intravenous Once  . allopurinol  300 mg Oral Daily  . amLODipine  10 mg Oral Daily  . vitamin C  500 mg Oral BID  . B-complex with vitamin C  1 tablet  Oral Daily  . Chlorhexidine Gluconate Cloth  6 each Topical Daily  . enoxaparin (LOVENOX) injection  40 mg Subcutaneous Q24H  . famotidine  20 mg Oral BID  . folic acid  1 mg Oral Daily  . insulin aspart  0-15 Units Subcutaneous TID WC  . insulin aspart  0-5 Units Subcutaneous QHS  . insulin glargine  20 Units Subcutaneous Daily  . nadolol  20 mg Oral Daily  . vancomycin variable dose per unstable renal function (pharmacist dosing)   Does not apply See admin instructions   Continuous Infusions: PRN Meds:.acetaminophen **OR** acetaminophen, azelastine **AND** fluticasone, bisacodyl, diphenhydrAMINE-zinc acetate  Subjective:   Heather Petty was seen and examined today.  Feeling  "so-so " today, no acute complaints.  No abdominal pain or diarrhea.  No bleeding, fevers or chills.  Denies any chest pain, shortness of breath, nausea or vomiting.   Objective:   Vitals:   08/08/19 0557 08/08/19 0750 08/08/19 0813 08/08/19 0814  BP:  (!) 144/58  (!) 147/94  Pulse: 60 61 62 (!) 59  Resp:  (!) 26 16 15   Temp: 98.4 F (36.9 C) 98.4 F (36.9 C)  98.3 F (36.8 C)  TempSrc:  Oral  Oral  SpO2:  96% 93% 97%  Weight:        Intake/Output Summary (Last 24 hours) at 08/08/2019 1013 Last data filed at 08/08/2019 0534 Gross per 24 hour  Intake 1367.39 ml  Output 1900 ml  Net -532.61 ml     Wt Readings from Last 3 Encounters:  08/05/19 110 kg  07/21/19 110 kg  07/21/18 106.3 kg   Physical Exam  General: Alert and oriented x 3, NAD  Cardiovascular: S1 S2 clear, RRR. No pedal edema b/l  Respiratory: CTAB, no wheezing, rales or rhonchi  Gastrointestinal: Soft, nontender, nondistended, NBS  Ext: no pedal edema bilaterally, RUE PICC line  Neuro: no new deficits  Musculoskeletal: No cyanosis, clubbing  Skin: No rashes  Psych: Normal affect and demeanor, alert and oriented x3    Data Reviewed:  I have personally reviewed following labs and imaging studies  Micro  Results Recent Results (from the past 240 hour(s))  SARS CORONAVIRUS 2 (TAT 6-24 HRS) Nasopharyngeal Nasopharyngeal Swab     Status: None   Collection Time: 07/31/19 11:45 AM   Specimen: Nasopharyngeal Swab  Result Value Ref Range Status   SARS Coronavirus 2 NEGATIVE NEGATIVE Final    Comment: (NOTE) SARS-CoV-2 target nucleic acids are NOT DETECTED.  The SARS-CoV-2 RNA is generally detectable in upper and lower respiratory specimens during the acute phase of infection. Negative results do not preclude SARS-CoV-2 infection, do not rule out co-infections with other pathogens, and should not be used as the sole basis for treatment or other patient management decisions. Negative results must be combined with clinical observations, patient history, and epidemiological information. The expected result is Negative.  Fact Sheet for Patients: SugarRoll.be  Fact Sheet for Healthcare Providers: https://www.woods-mathews.com/  This test is not yet approved or cleared by the [place] FDA and  has been authorized for detection and/or diagnosis of SARS-CoV-2 by FDA under an Emergency Use Authorization (EUA). This EUA will remain  in effect (meaning this test can be used) for the duration of the COVID-19 declaration under Se ction 564(b)(1) of the Act, 21 U.S.C. section 360bbb-3(b)(1), unless the authorization is terminated or revoked sooner.  Performed at [facility], [place] [address], [place], [place] [address]   SARS Coronavirus 2 by RT PCR (hospital order, performed in [facility] hospital lab) Nasopharyngeal Nasopharyngeal Swab     Status: None   Collection Time: 08/05/19  7:50 PM   Specimen: Nasopharyngeal Swab  Result Value Ref Range Status   SARS Coronavirus 2 NEGATIVE NEGATIVE Final    Comment: (NOTE) SARS-CoV-2 target nucleic acids are NOT DETECTED.  The SARS-CoV-2 RNA is generally detectable in upper and lower respiratory  specimens during the acute phase of infection. The lowest concentration of SARS-CoV-2 viral copies this assay can detect is 250 copies / mL. A negative result does not preclude SARS-CoV-2 infection and should not be used as the sole  basis for treatment or other patient management decisions.  A negative result may occur with improper specimen collection / handling, submission of specimen other than nasopharyngeal swab, presence of viral mutation(s) within the areas targeted by this assay, and inadequate number of viral copies (<250 copies / mL). A negative result must be combined with clinical observations, patient history, and epidemiological information.  Fact Sheet for Patients:   StrictlyIdeas.no  Fact Sheet for Healthcare Providers: BankingDealers.co.za  This test is not yet approved or  cleared by the [place] FDA and has been authorized for detection and/or diagnosis of SARS-CoV-2 by FDA under an Emergency Use Authorization (EUA).  This EUA will remain in effect (meaning this test can be used) for the duration of the COVID-19 declaration under Section 564(b)(1) of the Act, 21 U.S.C. section 360bbb-3(b)(1), unless the authorization is terminated or revoked sooner.  Performed at [facility], [place] [address], [place], [place] [address]     Radiology Reports CT ABDOMEN PELVIS WO CONTRAST  Addendum Date: 07/22/2019   ADDENDUM REPORT: 07/22/2019 00:43 ADDENDUM: Along the posterior left subcutaneous soft tissues overlying the iliac there is a focal area of ulceration and fat stranding changes. No loculated fluid collections or subcutaneous emphysema. No cortical destruction or bony erosion however seen at the posterior iliac. Electronically Signed   By: Prudencio Pair M.D.   On: 07/22/2019 00:43   Result Date: 07/22/2019 CLINICAL DATA:  Fever of unknown origin EXAM: CT CHEST, abdomen, and pelvis WITHOUT CONTRAST TECHNIQUE:  Multidetector CT imaging of the chest, abdomen and pelvis was performed following the standard protocol without IV contrast. COMPARISON:  July 19, 2018 FINDINGS: Cardiovascular: Scattered aortic atherosclerosis is noted. Coronary artery calcifications are seen. The heart size is normal. There is no pericardial thickening or effusion. Mediastinum/Nodes: There are no enlarged mediastinal, hilar or axillary lymph nodes. The thyroid gland, trachea and esophagus demonstrate no significant findings. Lungs/Pleura: Minimal streaky atelectasis seen at both lung bases. Small amount of tree-in-bud opacity seen at the posterior periphery of the right lung base as on the prior exam. There is a small 5 mm pulmonary nodule at the right lung base which was partially visualized on prior exam. No pleural effusion Upper abdomen: The visualized portion of the upper abdomen is unremarkable. Musculoskeletal/Chest wall: There is no chest wall mass or suspicious osseous finding. 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 Abdomen/pelvis: Hepatobiliary: There is diffuse low density seen throughout the liver parenchyma. No focal hepatic lesion although limited due to the lack of intravenous contrast. Layering hyperdense sludge/small stones are noted. Ill intrahepatic biliary ductal dilatation. Pancreas: Unremarkable.  No surrounding inflammatory changes. Spleen: Normal in size. Although limited due to the lack of intravenous contrast, normal in appearance. Adrenals/Urinary Tract: Both adrenal glands appear normal. The kidneys and collecting system appear normal without evidence of urinary tract calculus or hydronephrosis. Bladder is unremarkable. Stomach/Bowel: The stomach, small bowel, and colon are normal in appearance. No inflammatory changes or obstructive findings. Diverticulosis without diverticulitis is seen. The appendix is unremarkable.  Vascular/Lymphatic: There are no enlarged abdominal or pelvic lymph nodes. Scattered aortic atherosclerotic calcifications are seen without aneurysmal dilatation. Reproductive: The uterus and adnexa are unremarkable. Other: No evidence of abdominal wall mass or hernia. Musculoskeletal: No acute or significant osseous findings. IMPRESSION: 1. No definite acute intrathoracic, abdominal, or pelvic pathology to explain the patient's symptoms. 2. Unchanged minimal tree-in-bud opacities at the posterior periphery  of the right lung base, likely due to chronic inflammatory process. 3.  Cholelithiasis without evidence of acute cholecystitis. 4. 5 mm pulmonary nodule at the right lung base. No follow-up recommended. This recommendation follows the consensus statement: Guidelines for Management of Incidental Pulmonary Nodules Detected on CT Images: From the Fleischner Society 2017; Radiology 2017; 284:228-243. 5.  Aortic Atherosclerosis (ICD10-I70.0). Electronically Signed: By: Prudencio Pair M.D. On: 07/22/2019 00:12   DG Chest 2 View  Result Date: 07/21/2019 CLINICAL DATA:  Suspected sepsis. EXAM: CHEST - 2 VIEW COMPARISON:  Radiograph 07/19/2018 FINDINGS: Low lung volumes. Mild cardiomegaly. Aortic atherosclerosis. Peribronchial thickening. There are streaky bibasilar opacities. No confluent airspace disease. No significant pleural effusion. No pneumothorax. No acute osseous abnormalities are seen. IMPRESSION: 1. Low lung volumes with streaky bibasilar opacities, favoring atelectasis. 2. Peribronchial thickening. 3. Mild cardiomegaly. Electronically Signed   By: Keith Rake M.D.   On: 07/21/2019 22:46   DG Shoulder Right  Result Date: 07/24/2019 CLINICAL DATA:  Unwitnessed fall EXAM: RIGHT SHOULDER - 2+ VIEW COMPARISON:  None. FINDINGS: Degenerative changes in the right [facility] and glenohumeral joints with joint space narrowing and spurring. No acute bony abnormality. Specifically, no fracture, subluxation, or  dislocation. IMPRESSION: Degenerative changes.  No acute bony abnormality. Electronically Signed   By: Rolm Baptise M.D.   On: 07/24/2019 20:55   CT Head Wo Contrast  Result Date: 07/21/2019 CLINICAL DATA:  Altered mental status. EXAM: CT HEAD WITHOUT CONTRAST TECHNIQUE: Contiguous axial images were obtained from the base of the skull through the vertex without intravenous contrast. COMPARISON:  Head CT 05/07/2018 FINDINGS: Brain: No intracranial hemorrhage, mass effect, or midline shift. Normal brain volume for age. No hydrocephalus. The basilar cisterns are patent. Mild chronic small vessel ischemia. No evidence of territorial infarct or acute ischemia. No extra-axial or intracranial fluid collection. Vascular: Atherosclerosis of skullbase vasculature without hyperdense vessel or abnormal calcification. Skull: No fracture or focal lesion. Sinuses/Orbits: Paranasal sinuses and mastoid air cells are clear. The visualized orbits are unremarkable. Other: None. IMPRESSION: 1. No acute intracranial abnormality. 2. Mild chronic small vessel ischemia. Electronically Signed   By: Keith Rake M.D.   On: 07/21/2019 22:59   CT Chest Wo Contrast  Addendum Date: 07/22/2019   ADDENDUM REPORT: 07/22/2019 00:43 ADDENDUM: Along the posterior left subcutaneous soft tissues overlying the iliac there is a focal area of ulceration and fat stranding changes. No loculated fluid collections or subcutaneous emphysema. No cortical destruction or bony erosion however seen at the posterior iliac. Electronically Signed   By: Prudencio Pair M.D.   On: 07/22/2019 00:43   Result Date: 07/22/2019 CLINICAL DATA:  Fever of unknown origin EXAM: CT CHEST, abdomen, and pelvis WITHOUT CONTRAST TECHNIQUE: Multidetector CT imaging of the chest, abdomen and pelvis was performed following the standard protocol without IV contrast. COMPARISON:  July 19, 2018 FINDINGS: Cardiovascular: Scattered aortic atherosclerosis is noted. Coronary artery  calcifications are seen. The heart size is normal. There is no pericardial thickening or effusion. Mediastinum/Nodes: There are no enlarged mediastinal, hilar or axillary lymph nodes. The thyroid gland, trachea and esophagus demonstrate no significant findings. Lungs/Pleura: Minimal streaky atelectasis seen at both lung bases. Small amount of tree-in-bud opacity seen at the posterior periphery of the right lung base as on the prior exam. There is a small 5 mm pulmonary nodule at the right lung base which was partially visualized on prior exam. No pleural effusion Upper abdomen: The visualized portion of the upper abdomen is unremarkable. Musculoskeletal/Chest wall:  There is no chest wall mass or suspicious osseous finding. 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 Abdomen/pelvis: Hepatobiliary: There is diffuse low density seen throughout the liver parenchyma. No focal hepatic lesion although limited due to the lack of intravenous contrast. Layering hyperdense sludge/small stones are noted. Ill intrahepatic biliary ductal dilatation. Pancreas: Unremarkable.  No surrounding inflammatory changes. Spleen: Normal in size. Although limited due to the lack of intravenous contrast, normal in appearance. Adrenals/Urinary Tract: Both adrenal glands appear normal. The kidneys and collecting system appear normal without evidence of urinary tract calculus or hydronephrosis. Bladder is unremarkable. Stomach/Bowel: The stomach, small bowel, and colon are normal in appearance. No inflammatory changes or obstructive findings. Diverticulosis without diverticulitis is seen. The appendix is unremarkable. Vascular/Lymphatic: There are no enlarged abdominal or pelvic lymph nodes. Scattered aortic atherosclerotic calcifications are seen without aneurysmal dilatation. Reproductive: The uterus and adnexa are unremarkable. Other: No evidence of  abdominal wall mass or hernia. Musculoskeletal: No acute or significant osseous findings. IMPRESSION: 1. No definite acute intrathoracic, abdominal, or pelvic pathology to explain the patient's symptoms. 2. Unchanged minimal tree-in-bud opacities at the posterior periphery of the right lung base, likely due to chronic inflammatory process. 3.  Cholelithiasis without evidence of acute cholecystitis. 4. 5 mm pulmonary nodule at the right lung base. No follow-up recommended. This recommendation follows the consensus statement: Guidelines for Management of Incidental Pulmonary Nodules Detected on CT Images: From the Fleischner Society 2017; Radiology 2017; [id number]. 5.  Aortic Atherosclerosis (ICD10-I70.0). Electronically Signed: By: Prudencio Pair M.D. On: 07/22/2019 00:12   DG Chest Port 1 View  Result Date: 08/05/2019 CLINICAL DATA:  Shortness of breath EXAM: PORTABLE CHEST 1 VIEW COMPARISON:  07/23/2019 FINDINGS: No focal opacity or pleural effusion. Stable cardiomediastinal silhouette with aortic atherosclerosis. No pneumothorax. Right upper extremity central venous catheter with tip over the SVC. IMPRESSION: No active disease. Electronically Signed   By: Donavan Foil M.D.   On: 08/05/2019 20:28   DG CHEST PORT 1 VIEW  Result Date: 07/23/2019 CLINICAL DATA:  Tachypnea EXAM: PORTABLE CHEST 1 VIEW COMPARISON:  07/21/2019 FINDINGS: The heart size and mediastinal contours are within normal limits. Aortic atherosclerosis. Both lungs are clear. The visualized skeletal structures are unremarkable. IMPRESSION: No active disease. Electronically Signed   By: Donavan Foil M.D.   On: 07/23/2019 19:39   DG Humerus Right  Result Date: 07/24/2019 CLINICAL DATA:  Unwitnessed fall EXAM: RIGHT HUMERUS - 2+ VIEW COMPARISON:  Shoulder series today FINDINGS: Degenerative changes in the right AC and glenohumeral joints. No acute bony abnormality. Specifically, no fracture, subluxation, or dislocation. IMPRESSION: No acute  bony abnormality. Electronically Signed   By: Rolm Baptise M.D.   On: 07/24/2019 20:56   ECHOCARDIOGRAM COMPLETE  Result Date: 07/23/2019    ECHOCARDIOGRAM REPORT   Patient Name:   Heather Petty Date of Exam: 07/23/2019 Medical Rec #:  [id number]        Height:       61.0 in Accession #:    [id number]       Weight:       242.5 lb Date of Birth:  1950/06/09        BSA:          2.050 m Patient Age:    42 years         BP:           135/56 mmHg Patient Gender: F  HR:           84 bpm. Exam Location:  Inpatient Procedure: 2D Echo, Cardiac Doppler and Color Doppler Indications:    Bacteremia  History:        Patient has prior history of Echocardiogram examinations, most                 recent 07/20/2018. Risk Factors:Diabetes, Hypertension,                 Dyslipidemia and Former Smoker. MRSA bacteremia.  Sonographer:    Clayton Lefort RDCS (AE) Referring Phys: [place]  1. Left ventricular ejection fraction, by estimation, is 65 to 70%. The left ventricle has normal function. The left ventricle has no regional wall motion abnormalities. There is mild concentric left ventricular hypertrophy. Left ventricular diastolic parameters are consistent with Grade I diastolic dysfunction (impaired relaxation).  2. Right ventricular systolic function is normal. The right ventricular size is normal. There is normal pulmonary artery systolic pressure. The estimated right ventricular systolic pressure is 84.6 mmHg.  3. The mitral valve is normal in structure. No evidence of mitral valve regurgitation. No evidence of mitral stenosis.  4. The aortic valve has an indeterminant number of cusps. Aortic valve regurgitation is not visualized. Mild to moderate aortic valve sclerosis/calcification is present, without any evidence of aortic stenosis.  5. The inferior vena cava is normal in size with greater than 50% respiratory variability, suggesting right atrial pressure of 3 mmHg.  Conclusion(s)/Recommendation(s): No evidence of valvular vegetations on this transthoracic echocardiogram. Would recommend a transesophageal echocardiogram to exclude infective endocarditis if clinically indicated. FINDINGS  Left Ventricle: Left ventricular ejection fraction, by estimation, is 65 to 70%. The left ventricle has normal function. The left ventricle has no regional wall motion abnormalities. The left ventricular internal cavity size was normal in size. There is  mild concentric left ventricular hypertrophy. Left ventricular diastolic parameters are consistent with Grade I diastolic dysfunction (impaired relaxation). Normal left ventricular filling pressure. Right Ventricle: The right ventricular size is normal. No increase in right ventricular wall thickness. Right ventricular systolic function is normal. There is normal pulmonary artery systolic pressure. The tricuspid regurgitant velocity is 2.48 m/s, and  with an assumed right atrial pressure of 3 mmHg, the estimated right ventricular systolic pressure is 96.2 mmHg. Left Atrium: Left atrial size was normal in size. Right Atrium: Right atrial size was normal in size. Pericardium: There is no evidence of pericardial effusion. Mitral Valve: The mitral valve is normal in structure. Normal mobility of the mitral valve leaflets. Mild mitral annular calcification. No evidence of mitral valve regurgitation. No evidence of mitral valve stenosis. MV peak gradient, 3.5 mmHg. The mean mitral valve gradient is 2.0 mmHg. Tricuspid Valve: The tricuspid valve is normal in structure. Tricuspid valve regurgitation is trivial. No evidence of tricuspid stenosis. Aortic Valve: The aortic valve has an indeterminant number of cusps. Aortic valve regurgitation is not visualized. Mild to moderate aortic valve sclerosis/calcification is present, without any evidence of aortic stenosis. Aortic valve mean gradient measures 5.0 mmHg. Aortic valve peak gradient measures 12.7  mmHg. Aortic valve area, by VTI measures 2.20 cm. Pulmonic Valve: The pulmonic valve was normal in structure. Pulmonic valve regurgitation is not visualized. No evidence of pulmonic stenosis. Aorta: The aortic root is normal in size and structure. Venous: The inferior vena cava is normal in size with greater than 50% respiratory variability, suggesting right atrial pressure of 3 mmHg. IAS/Shunts: The interatrial septum appears  to be lipomatous. No atrial level shunt detected by color flow Doppler.  LEFT VENTRICLE PLAX 2D LVIDd:         3.50 cm  Diastology LVIDs:         2.20 cm  LV e' lateral:   8.38 cm/s LV PW:         1.20 cm  LV E/e' lateral: 9.9 LV IVS:        1.10 cm  LV e' medial:    7.94 cm/s LVOT diam:     1.70 cm  LV E/e' medial:  10.4 LV SV:         64 LV SV Index:   31 LVOT Area:     2.27 cm  RIGHT VENTRICLE             IVC RV Basal diam:  3.00 cm     IVC diam: 1.50 cm RV S prime:     11.00 cm/s TAPSE (M-mode): 1.6 cm LEFT ATRIUM             Index       RIGHT ATRIUM           Index LA diam:        2.40 cm 1.17 cm/m  RA Area:     11.10 cm LA Vol (A2C):   54.7 ml 26.68 ml/m RA Volume:   22.60 ml  11.02 ml/m LA Vol (A4C):   36.9 ml 18.00 ml/m LA Biplane Vol: 45.4 ml 22.15 ml/m  AORTIC VALVE AV Area (Vmax):    2.10 cm AV Area (Vmean):   2.23 cm AV Area (VTI):     2.20 cm AV Vmax:           178.00 cm/s AV Vmean:          109.000 cm/s AV VTI:            0.293 m AV Peak Grad:      12.7 mmHg AV Mean Grad:      5.0 mmHg LVOT Vmax:         165.00 cm/s LVOT Vmean:        107.000 cm/s LVOT VTI:          0.284 m LVOT/AV VTI ratio: 0.97  AORTA Ao Root diam: 2.60 cm Ao Asc diam:  3.20 cm MITRAL VALVE                TRICUSPID VALVE MV Area (PHT): 2.69 cm     TR Peak grad:   24.6 mmHg MV Peak grad:  3.5 mmHg     TR Vmax:        248.00 cm/s MV Mean grad:  2.0 mmHg MV Vmax:       0.94 m/s     SHUNTS MV Vmean:      58.4 cm/s    Systemic VTI:  0.28 m MV Decel Time: 282 msec     Systemic Diam: 1.70 cm MV E  velocity: 82.60 cm/s MV A velocity: 102.00 cm/s MV E/A ratio:  0.81 Fransico Him MD Electronically signed by Fransico Him MD Signature Date/Time: 07/23/2019/4:37:10 PM    Final    [place] EKG SITE RITE  Result Date: 07/28/2019 If Site Rite image not attached, placement could not be confirmed due to current cardiac rhythm.  US Abdomen Limited RUQ  Result Date: 08/05/2019 CLINICAL DATA:  Elevated LFTs EXAM: ULTRASOUND ABDOMEN LIMITED RIGHT UPPER QUADRANT COMPARISON:  CT dated July 21, 2019 FINDINGS: Gallbladder: No gallstones or  wall thickening visualized. No sonographic Murphy sign noted by sonographer. Common bile duct: Diameter: 6 mm Liver: Diffuse increased echogenicity with slightly heterogeneous liver. Appearance typically secondary to fatty infiltration. Fibrosis secondary consideration. No secondary findings of cirrhosis noted. No focal hepatic lesion or intrahepatic biliary duct dilatation. Portal vein is patent on color Doppler imaging with normal direction of blood flow towards the liver. Other: None. IMPRESSION: 1. No acute abnormality. 2. Hepatic steatosis. Electronically Signed   By: Constance Holster M.D.   On: 08/05/2019 22:30    Lab Data:  CBC: Recent Labs  Lab 08/05/19 1329 08/06/19 0054 08/07/19 0007 08/07/19 0316 08/08/19 0450  WBC 5.7  --   --  6.4 4.8  HGB 6.7* 7.4* 8.4* 8.9* 8.6*  HCT 22.5* 24.4* 27.0* 27.8* 26.7*  MCV 98.3  --   --  93.9 94.7  PLT 229  --   --  211 106   Basic Metabolic Panel: Recent Labs  Lab 08/05/19 1329 08/06/19 0054 08/07/19 0316 08/07/19 1653 08/08/19 0450  NA 141 140 140  --  140  K 4.0 4.0 4.2  --  3.8  CL 112* 113* 113*  --  112*  CO2 21* 21* 21*  --  21*  GLUCOSE 232* 225* 88  --  88  BUN 22 23 22   --  15  CREATININE 1.51* 1.58* 1.42* 1.30* 1.22*  CALCIUM 8.3* 8.4* 8.3*  --  8.6*   GFR: Estimated Creatinine Clearance: 49.2 mL/min (A) (by C-G formula based on SCr of 1.22 mg/dL (H)). Liver Function Tests: Recent Labs  Lab  08/05/19 1329 08/06/19 0054 08/07/19 0316 08/08/19 0450  AST 36 24 18 63*  ALT 78* 66* 45* 58*  ALKPHOS 440* 370* 272* 448*  BILITOT 2.0* 2.1* 1.9* 3.9*  PROT 5.9* 5.7* 5.7* 5.3*  ALBUMIN 2.6* 2.5* 2.5* 2.3*   Recent Labs  Lab 08/05/19 1329  LIPASE 42   No results for input(s): AMMONIA in the last 168 hours. Coagulation Profile: No results for input(s): INR, PROTIME in the last 168 hours. Cardiac Enzymes: No results for input(s): CKTOTAL, CKMB, CKMBINDEX, TROPONINI in the last 168 hours. BNP (last 3 results) No results for input(s): PROBNP in the last 8760 hours. HbA1C: No results for input(s): HGBA1C in the last 72 hours. CBG: Recent Labs  Lab 08/07/19 0606 08/07/19 1225 08/07/19 1854 08/07/19 2107 08/08/19 0611  GLUCAP 86 160* 140* 145* 72   Lipid Profile: No results for input(s): CHOL, HDL, LDLCALC, TRIG, CHOLHDL, LDLDIRECT in the last 72 hours. Thyroid Function Tests: No results for input(s): TSH, T4TOTAL, FREET4, T3FREE, THYROIDAB in the last 72 hours. Anemia Panel: Recent Labs    08/06/19 0054  VITAMINB12 1,281*  FOLATE 16.7  FERRITIN 1,095*  TIBC 326  IRON 63  RETICCTPCT 1.7   Urine analysis:    Component Value Date/Time   COLORURINE AMBER (A) 07/28/2019 1236   APPEARANCEUR CLEAR 07/28/2019 1236   LABSPEC 1.014 07/28/2019 1236   PHURINE 7.0 07/28/2019 1236   GLUCOSEU NEGATIVE 07/28/2019 1236   HGBUR NEGATIVE 07/28/2019 1236   BILIRUBINUR NEGATIVE 07/28/2019 1236   KETONESUR NEGATIVE 07/28/2019 1236   PROTEINUR 30 (A) 07/28/2019 1236   UROBILINOGEN 0.2 12/27/2010 1858   NITRITE NEGATIVE 07/28/2019 1236   LEUKOCYTESUR NEGATIVE 07/28/2019 1236     Heather Petty M.D. [facility] 08/08/2019, 10:13 AM   Call night coverage person covering after 7pm

## 2019-08-08 NOTE — Consult Note (Signed)
[facility] Consult: 10:09 AM 08/08/2019  LOS: 3 days    Referring Provider: Dr Tana Coast  Primary Care Physician:  [name], MD Primary Gastroenterologist: unassigned    Reason for Consultation:  Elevated LFTs.     HPI: Heather Petty is a 69 y.o. female.  PMH Schizophrenia. Asthma. Vitiligo. Morbid obesity, BMI 49.  CVA.  Htn.  Chronic sacral decubitus.  AKI.  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  IDDM, type 2, poorly controlled.  Chronic pain.  Gout, on Colchicine.  Grade 1 diastolic dysfx on echo. Thrombocytopenia dating to 2019. Platelets 132 in 03/2018.  Anemia of chronic dz, Hgb 7.7 , MCV 107 and received PRBC x1 in 03/2018.      No previous EGD or colonoscopy.    6/12 - 2/72 admit w metabollic encephalopathy, MRSA and enterococcal bacteremia, klebsiella UTI, DKA, Rhabdo.  Source of bacteremia attributed to chronic sacral decub.   LFTs elevated w  T bili 2.7>> 1.6.  Alk phos 477 >> 125.  AST/ALT 112/66 >> 53/34.  INR 1.3.  Platelet nadir 119, hgb 8.4 on 6/20.  07/19/18 CTAP w contrast: colon diverticulosis, hepatic steatosis, aortic atherosclerosis.  07/19/19 ultrasound: extensive hepatic steatosis.  6 mm GB polyp vs non-shadowing stone.  CBD 6 mm.  Patent PV w normal directional flow.   Treated w Zyvox, Vanco.  PICC line placed to allow for IV vanco through 7/26.  Contd were Dulcolax and Miralax prn, Colchicine bid, famotidine 20 bid.    Read[facility] w anemia, Hgb 6.7 >> PRBC x 1 >> 8.6 .  MCV 98. Platelets ok.   Non-bloody, FOBT negative.  AKI.  Elevated LFTs. t bil 2.2 >> 3.9.  Alk phos 440  >> 448.  AST/ALT 36/78 >> 63/58.  Lipase normal.   FOBT negative.   Iron 63, TIBC 326, iron sat 19% all of these normal. Ferritin 1095. Folate 16.7, B12 1281,    In 12/2017 hep B and C  studies negative, she was positive for hep a IgM. Appetite moderately depressed, not thrilled w food selection at SNF.  C/o heartburn, especially after takes po meds.  No dysphagia, abd pain, n/v.  Previous diarrhea resolved, had formed brown stool every 2 to 3 d. Urine is yellow, not amber-colored.  Family history No history of colorectal cancers, colitis, ulcers, liver or pancreatic disease.  Social history Not a drinker. No significant history of any alcohol intake.    Past Medical History:  Diagnosis Date  . AKI (acute kidney injury) ([place]) 12/17/2017  . Arthritis   . Asthma   . Depression   . Diabetes mellitus   . Gout   . High cholesterol   . Hypertension   . Morbid obesity ([place])   . Pneumonia 12/17/2017  . Schizophrenia ([place])   . Stroke [facility])     Past Surgical History:  Procedure Laterality Date  . CYST EXCISION    . DENTAL SURGERY    . TONSILLECTOMY      Prior to Admission medications   Medication Sig Start Date End  Date Taking? Authorizing Provider  acetaminophen (TYLENOL) 325 MG tablet Take 650 mg by mouth 4 (four) times daily as needed for headache (pain).   Yes [provider]  albuterol (VENTOLIN HFA) 108 (90 Base) MCG/ACT inhaler Inhale 2 puffs into the lungs every 4 (four) hours as needed for wheezing or shortness of breath.   Yes [provider]  allopurinol (ZYLOPRIM) 300 MG tablet Take 300 mg by mouth daily.   Yes [provider]  amLODipine (NORVASC) 10 MG tablet Take 10 mg by mouth daily.   Yes [provider]  Azelastine-Fluticasone (DYMISTA) 137-50 MCG/ACT SUSP Place 1 spray into both nostrils 2 (two) times daily as needed (for allergies).    Yes [provider]  b complex vitamins tablet Take 1 tablet by mouth daily.   Yes [provider]  benazepril (LOTENSIN) 20 MG tablet Take 20 mg by mouth daily.   Yes [provider]  bisacodyl (DULCOLAX) 5 MG EC tablet Take 1 tablet (5 mg total) by  mouth daily as needed for moderate constipation. 12/24/17  Yes Lady Deutscher, MD  colchicine 0.6 MG tablet Take 1 tablet (0.6 mg total) by mouth 2 (two) times daily. 04/05/18  Yes Ghimire, Henreitta Leber, MD  Colloidal Oatmeal (EUCERIN ECZEMA RELIEF) 1 % CREA Apply 1 application topically 2 (two) times daily.   Yes [provider]  diphenhydrAMINE (BENADRYL) 2 % cream Apply 1 application topically 3 (three) times daily as needed for itching.   Yes [provider]  doxepin (SINEQUAN) 25 MG capsule Take 25 mg by mouth at bedtime.    Yes [provider]  famotidine (PEPCID) 20 MG tablet Take 20 mg by mouth 2 (two) times daily.   Yes [provider]  folic acid (FOLVITE) 1 MG tablet Take 1 mg by mouth daily.   Yes [provider]  glipiZIDE (GLUCOTROL XL) 10 MG 24 hr tablet Take 10 mg by mouth 2 (two) times daily.    Yes [provider]  hydrocortisone 2.5 % cream Apply 1 application topically See admin instructions. Apply topically to rash on face twice daily   Yes [provider]  hydrOXYzine (ATARAX/VISTARIL) 25 MG tablet Take 25 mg by mouth 2 (two) times daily.    Yes [provider]  insulin aspart (NOVOLOG) 100 UNIT/ML injection Inject 0-12 Units into the skin See admin instructions. Inject 0-12 units into the skin 4 times a day (before meals and at bedtime) per sliding scale: BGL <60 or >400  = CALL MD; 60-200 = 0 units; 201-250 = 2 units; 251-300 = 4 units; 301-350 = 6 units; 351-400 = 8 units; 401-450 = 10 units; >450 = 12 units 07/31/19  Yes Annita Brod, MD  insulin glargine (LANTUS) 100 UNIT/ML injection Inject 0.25 mLs (25 Units total) into the skin at bedtime. 07/31/19  Yes Annita Brod, MD  ipratropium-albuterol (DUONEB) 0.5-2.5 (3) MG/3ML SOLN Take 3 mLs by nebulization every 6 (six) hours as needed (for shortness of breath or wheezing).    Yes [provider]  Multiple Vitamins-Minerals (THERA-M  MULTIPLE VITAMINS PO) Take 1 tablet by mouth daily.   Yes [provider]  nadolol (CORGARD) 20 MG tablet Take 1 tablet (20 mg total) by mouth daily. 07/23/18  Yes Oswald Hillock, MD  nystatin (MYCOSTATIN/NYSTOP) powder Apply 1 g topically See admin instructions. Apply topically twice daily as needed for wound care  -  between folds, beneath breasts, bilateral axillae, and under  pannus   Yes [provider]  polyethylene glycol (MIRALAX / GLYCOLAX) 17 g packet Take 17 g by mouth daily as needed for mild constipation. 07/31/19  Yes Annita Brod, MD  pravastatin (PRAVACHOL) 40 MG tablet Take 40 mg by mouth at bedtime.    Yes [provider]  risperiDONE (RISPERDAL) 0.5 MG tablet Take 1 tablet (0.5 mg total) by mouth 2 (two) times daily. 12/23/17 08/05/19 Yes Purohit, Konrad Dolores, MD  vitamin C (ASCORBIC ACID) 500 MG tablet Take 500 mg by mouth 2 (two) times daily.   Yes [provider]  Vitamin D, Ergocalciferol, (DRISDOL) 1.25 MG (50000 UT) CAPS capsule Take 50,000 Units by mouth every Monday.    Yes [provider]  Zinc Sulfate (ZINC-220 PO) Take 220 mg by mouth at bedtime.    Yes [provider]  collagenase (SANTYL) ointment Apply topically daily. Patient not taking: Reported on 08/05/2019 02/09/18   Elgergawy, Silver Huguenin, MD  vancomycin IVPB Inject 1,000 mg into the vein every 12 (twelve) hours. Indication: MRSA and Enterococcus Bacteremia First Dose: Yes Last Day of Therapy: 09/01/2019 Labs - Sunday/Monday:  CBC/D, BMP, and vancomycin trough. Labs - Thursday:  BMP and vancomycin trough Labs - Every other week:  ESR and CRP Method of administration:Elastomeric Method of administration may be changed at the discretion of the patient and/or caregiver's ability to self-administer the medication ordered. Patient not taking: Reported on 08/05/2019 07/31/19 09/02/19  Annita Brod, MD    Scheduled Meds: . sodium chloride   Intravenous Once  .  allopurinol  300 mg Oral Daily  . amLODipine  10 mg Oral Daily  . vitamin C  500 mg Oral BID  . B-complex with vitamin C  1 tablet Oral Daily  . Chlorhexidine Gluconate Cloth  6 each Topical Daily  . enoxaparin (LOVENOX) injection  40 mg Subcutaneous Q24H  . famotidine  20 mg Oral BID  . folic acid  1 mg Oral Daily  . insulin aspart  0-15 Units Subcutaneous TID WC  . insulin aspart  0-5 Units Subcutaneous QHS  . insulin glargine  20 Units Subcutaneous Daily  . nadolol  20 mg Oral Daily  . vancomycin variable dose per unstable renal function (pharmacist dosing)   Does not apply See admin instructions   Infusions:  PRN Meds: acetaminophen **OR** acetaminophen, azelastine **AND** fluticasone, bisacodyl, diphenhydrAMINE-zinc acetate   Allergies as of 08/05/2019 - Review Complete 08/05/2019  Allergen Reaction Noted  . Lasix [furosemide] Other (See Comments) 05/07/2018    Family History  Family history unknown: Yes    Social History   Socioeconomic History  . Marital status: Single    Spouse name: Not on file  . Number of children: Not on file  . Years of education: Not on file  . Highest education level: Not on file  Occupational History  . Not on file  Tobacco Use  . Smoking status: Former Smoker    Quit date: 02/06/1974    Years since quitting: 45.5  . Smokeless tobacco: Never Used  Vaping Use  . Vaping Use: Never used  Substance and Sexual Activity  . Alcohol use: No  . Drug use: No  . Sexual activity: Not on file  Other Topics Concern  . Not on file  Social History Narrative  . Not on file   Social Determinants of Health   Financial Resource Strain:   . Difficulty of Paying Living Expenses:   Food Insecurity:   . Worried  About Running Out of Food in the Last Year:   . [place] in the Last Year:   Transportation Needs:   . Lack of Transportation (Medical):   Marland Kitchen Lack of Transportation (Non-Medical):   Physical Activity:   . Days of Exercise per Week:    . Minutes of Exercise per Session:   Stress:   . Feeling of Stress :   Social Connections:   . Frequency of Communication with Friends and Family:   . Frequency of Social Gatherings with Friends and Family:   . Attends Religious Services:   . Active Member of Clubs or Organizations:   . Attends Archivist Meetings:   Marland Kitchen Marital Status:   Intimate Partner Violence:   . Fear of Current or Ex-Partner:   . Emotionally Abused:   Marland Kitchen Physically Abused:   . Sexually Abused:     REVIEW OF SYSTEMS: Constitutional: Slowly gaining some strength at the SNF. She has limited mobility with walker assistance. ENT:  No nose bleeds. Currently complaining of dental pain emanating from the upper left jaw. Pulm: No recent problems with her asthma. Denies shortness of breath and cough. CV:  No palpitations, no LE edema. No chest pressure or pain GU:  No hematuria, no frequency GI: See HPI. Heme: No unusual or excessive bleeding or bruising. Transfusions: See HPI. Neuro:  No headaches, no peripheral tingling or numbness Derm:  No itching, no rash or sores.  Endocrine:  No sweats or chills.  No polyuria or dysuria Immunization:  Not covid vaccinated yet, interested as her inpt stay impeded ability to get vaccinated.   Travel:  None beyond local counties in last few months.    PHYSICAL EXAM: Vital signs in last 24 hours: Vitals:   08/08/19 0813 08/08/19 0814  BP:  (!) 147/94  Pulse: 62 (!) 59  Resp: 16 15  Temp:  98.3 F (36.8 C)  SpO2: 93% 97%   Wt Readings from Last 3 Encounters:  08/05/19 110 kg  07/21/19 110 kg  07/21/18 106.3 kg    General: Obese, alert, comfortable, somewhat chronically but not acutely ill-appearing Head: No facial asymmetry or swelling. No signs of head trauma. Eyes: No scleral icterus. No conjunctival pallor. EOMI Ears: Not hard of hearing Nose: No congestion or discharge Mouth: Mucosa is moist, pink, clear. Tongue is midline. She has about four or  five teeth remaining, a tooth in the upper left jaw has extensive caries, mucosa surrounding this area does not look swollen or erythematous. Neck: No JVD, thyromegaly, masses. Lungs: Clear bilaterally. No labored breathing or cough. Heart: RRR. No MRG, S1, S2 present. Abdomen: Large, nonprotuberant or distended. Nontender. Bowel sounds active. No HSM, bruits, hernias, masses..   Rectal: Deferred Musc/Skeltl: No joint erythema, swelling or gross deformity. Extremities: No CCE. Neurologic: Alert. Oriented x3. Moves all four limbs without tremor, strength not tested. No gross deficits. Skin: Vitiligo on limbs, trunk. Tattoos: None observed Nodes: No cervical adenopathy Psych: Calm, cooperative, pleasant. Fluid speech.  Intake/Output from previous day: 07/01 0701 - 07/02 0700 In: 1367.4 [P.O.:360; I.V.:1007.4] Out: 1900 [Urine:1900] Intake/Output this shift: No intake/output data recorded.  LAB RESULTS: Recent Labs    08/05/19 1329 08/06/19 0054 08/07/19 0007 08/07/19 0316 08/08/19 0450  WBC 5.7  --   --  6.4 4.8  HGB 6.7*   < > 8.4* 8.9* 8.6*  HCT 22.5*   < > 27.0* 27.8* 26.7*  PLT 229  --   --  211 196   < > =  values in this interval not displayed.   BMET Lab Results  Component Value Date   NA 140 08/08/2019   NA 140 08/07/2019   NA 140 08/06/2019   K 3.8 08/08/2019   K 4.2 08/07/2019   K 4.0 08/06/2019   CL 112 (H) 08/08/2019   CL 113 (H) 08/07/2019   CL 113 (H) 08/06/2019   CO2 21 (L) 08/08/2019   CO2 21 (L) 08/07/2019   CO2 21 (L) 08/06/2019   GLUCOSE 88 08/08/2019   GLUCOSE 88 08/07/2019   GLUCOSE 225 (H) 08/06/2019   BUN 15 08/08/2019   BUN 22 08/07/2019   BUN 23 08/06/2019   CREATININE 1.22 (H) 08/08/2019   CREATININE 1.30 (H) 08/07/2019   CREATININE 1.42 (H) 08/07/2019   CALCIUM 8.6 (L) 08/08/2019   CALCIUM 8.3 (L) 08/07/2019   CALCIUM 8.4 (L) 08/06/2019   LFT Recent Labs    08/06/19 0054 08/07/19 0316 08/08/19 0450  PROT 5.7* 5.7* 5.3*    ALBUMIN 2.5* 2.5* 2.3*  AST 24 18 63*  ALT 66* 45* 58*  ALKPHOS 370* 272* 448*  BILITOT 2.1* 1.9* 3.9*   PT/INR Lab Results  Component Value Date   INR 1.3 (H) 07/21/2019   Lipase     Component Value Date/Time   LIPASE 42 08/05/2019 1329    Drugs of Abuse  No results found for: LABOPIA, COCAINSCRNUR, LABBENZ, AMPHETMU, THCU, LABBARB   RADIOLOGY STUDIES: No results found.    IMPRESSION:   *   Elevated LFTs. These are rising but LFT elevation noted during recent admission with multiorganism bacteremia/sepsis/UTI.   Ultrasound and CT imaging confirm hepatic steatosis. ? DILI.    *    GERD. Gets fair amount of heartburn on famotidine. No previous EGD  *     IDDM. Historically poorly controlled. A1c 10.4 in mid June 2021. Currently blood glucose in 80s.  *   Anemia of chronic disease. Not iron, B12 or folate deficient. Hb improved after transfusion. FOBT negative    PLAN:     *    Obtain ANA, smooth muscle antibody, mitochondrial antibody, ceruloplasmin, hepatitis B and C serologies.  *   May require liver biopsy.  Eventually ought to undergo screeing colonoscopy, not urgent given FOBT negative.    *   Switch to Protonix 40/day, stop famotidine.     Azucena Freed  08/08/2019, 10:09 AM Phone [phone]

## 2019-08-09 DIAGNOSIS — K591 Functional diarrhea: Secondary | ICD-10-CM

## 2019-08-09 LAB — GLUCOSE, CAPILLARY
Glucose-Capillary: 70 mg/dL (ref 70–99)
Glucose-Capillary: 113 mg/dL — ABNORMAL HIGH (ref 70–99)
Glucose-Capillary: 100 mg/dL — ABNORMAL HIGH (ref 70–99)
Glucose-Capillary: 160 mg/dL — ABNORMAL HIGH (ref 70–99)

## 2019-08-09 LAB — COMPREHENSIVE METABOLIC PANEL
ALT: 115 U/L — ABNORMAL HIGH (ref 0–44)
AST: 111 U/L — ABNORMAL HIGH (ref 15–41)
Albumin: 2.5 g/dL — ABNORMAL LOW (ref 3.5–5.0)
Alkaline Phosphatase: 479 U/L — ABNORMAL HIGH (ref 38–126)
Anion gap: 8 (ref 5–15)
BUN: 13 mg/dL (ref 8–23)
CO2: 23 mmol/L (ref 22–32)
Calcium: 8.7 mg/dL — ABNORMAL LOW (ref 8.9–10.3)
Chloride: 110 mmol/L (ref 98–111)
Creatinine, Ser: 1.27 mg/dL — ABNORMAL HIGH (ref 0.44–1.00)
GFR calc Af Amer: 50 mL/min — ABNORMAL LOW (ref >60)
GFR calc non Af Amer: 43 mL/min — ABNORMAL LOW (ref >60)
Glucose, Bld: 63 mg/dL — ABNORMAL LOW (ref 70–99)
Potassium: 3.7 mmol/L (ref 3.5–5.1)
Sodium: 141 mmol/L (ref 135–145)
Total Bilirubin: 5.9 mg/dL — ABNORMAL HIGH (ref 0.3–1.2)
Total Protein: 5.7 g/dL — ABNORMAL LOW (ref 6.5–8.1)

## 2019-08-09 LAB — CBC
HCT: 29 % — ABNORMAL LOW (ref 36.0–46.0)
Hemoglobin: 9 g/dL — ABNORMAL LOW (ref 12.0–15.0)
MCH: 29.4 pg (ref 26.0–34.0)
MCHC: 31 g/dL (ref 30.0–36.0)
MCV: 94.8 fL (ref 80.0–100.0)
Platelets: 193 10*3/uL (ref 150–400)
RBC: 3.06 MIL/uL — ABNORMAL LOW (ref 3.87–5.11)
RDW: 18.9 % — ABNORMAL HIGH (ref 11.5–15.5)
WBC: 4.8 10*3/uL (ref 4.0–10.5)
nRBC: 0 % (ref 0.0–0.2)

## 2019-08-09 LAB — CREATININE, SERUM
Creatinine, Ser: 1.28 mg/dL — ABNORMAL HIGH (ref 0.44–1.00)
GFR calc Af Amer: 49 mL/min — ABNORMAL LOW (ref >60)
GFR calc non Af Amer: 42 mL/min — ABNORMAL LOW (ref >60)

## 2019-08-09 LAB — HEPATITIS B SURFACE ANTIGEN: Hepatitis B Surface Ag: NONREACTIVE

## 2019-08-09 LAB — HEPATITIS C ANTIBODY: HCV Ab: NONREACTIVE

## 2019-08-09 LAB — HEPATITIS B SURFACE ANTIBODY,QUALITATIVE: Hep B S Ab: NONREACTIVE

## 2019-08-09 LAB — VANCOMYCIN, RANDOM: Vancomycin Rm: 21

## 2019-08-09 LAB — MITOCHONDRIAL ANTIBODIES: Mitochondrial M2 Ab, IgG: <20 Units (ref 0.0–20.0)

## 2019-08-09 MED ORDER — PROMETHAZINE HCL 25 MG/ML IJ SOLN
6.2500 mg | Freq: Four times a day (QID) | INTRAMUSCULAR | Status: DC | PRN
  Administered 2019-08-09 – 2019-08-11 (×3): 6.25 mg via INTRAVENOUS
  Filled 2019-08-09 (×3): qty 1

## 2019-08-09 MED ORDER — HYDROCODONE-ACETAMINOPHEN 5-325 MG PO TABS
1.0000 | ORAL_TABLET | Freq: Once | ORAL | Status: AC
  Administered 2019-08-09: 1 via ORAL
  Filled 2019-08-09: qty 1

## 2019-08-09 MED ORDER — INSULIN ASPART 100 UNIT/ML ~~LOC~~ SOLN
0.0000 [IU] | Freq: Three times a day (TID) | SUBCUTANEOUS | Status: DC
  Administered 2019-08-11: 3 [IU] via SUBCUTANEOUS
  Administered 2019-08-11: 2 [IU] via SUBCUTANEOUS
  Administered 2019-08-12: 3 [IU] via SUBCUTANEOUS
  Administered 2019-08-12: 5 [IU] via SUBCUTANEOUS
  Administered 2019-08-12: 7 [IU] via SUBCUTANEOUS
  Administered 2019-08-13 (×2): 2 [IU] via SUBCUTANEOUS

## 2019-08-09 MED ORDER — VANCOMYCIN HCL 750 MG/150ML IV SOLN
750.0000 mg | INTRAVENOUS | Status: DC
  Administered 2019-08-09 – 2019-08-10 (×2): 750 mg via INTRAVENOUS
  Filled 2019-08-09 (×3): qty 150

## 2019-08-09 MED ORDER — INSULIN GLARGINE 100 UNIT/ML ~~LOC~~ SOLN
10.0000 [IU] | Freq: Every day | SUBCUTANEOUS | Status: DC
  Administered 2019-08-09 – 2019-08-13 (×5): 10 [IU] via SUBCUTANEOUS
  Filled 2019-08-09 (×5): qty 0.1

## 2019-08-09 NOTE — Progress Notes (Signed)
[facility]                                                                              Patient Demographics  Heather Petty, is a 69 y.o. female, DOB - 04/11/1950, RNH:[id number]  Admit date - 08/05/2019   Admitting Physician Shela Leff, MD  Outpatient Primary MD for the patient is [name], MD  Outpatient specialists:   LOS - 4  days   Medical records reviewed and are as summarized below:    Chief Complaint  Patient presents with  . Abnormal Lab  . Abdominal Pain       Brief summary   Heather Petty is a 69 y.o. female with history of asthma, depression, insulin-dependent type 2 diabetes, gout, hypertension, hyperlipidemia, morbid obesity (BMI 45.82), schizophrenia, CVA presenting from [facility] for evaluation of low hemoglobin (5.6), diarrhea, abdominal pain, and shortness of breath.  Patient was recently admitted to the hospital 6/14-6/24 for sepsis secondary to MRSA and enterococcal bacteremia.  Source felt to be secondary to UTI and infected sacral decubitus ulcer.  Urine culture grew Klebsiella.  No valvular vegetations on TTE.  Infectious disease was consulted and recommended TEE which the patient refused.  PICC line was placed on 6/22 and she was discharged with plan to continue IV vancomycin for total of 6 weeks until 7/26. Patient reported that she was sent here from [facility] because her hemoglobin was low.  She has had nonbloody diarrhea for the past 2 days.  No nausea or vomiting.  She had some mild lower abdominal discomfort earlier which has now resolved.  Denies chest pain or shortness of breath.  No other complaints   Assessment & Plan    Principal Problem:   Diarrhea -Presented with diarrhea however abdominal exam benign, no fever or leukocytosis.  She has been on vancomycin till 7/26 after being recently diagnosed with MRSA and enterococcal bacteremia.  Patient reported no GI bleeding -Resolved, no repeat diarrhea while  inpatient, had one formed stool.  Active problems Elevated LFTs -Unclear etiology, trending up, alk phos 479, AST is 111, ALT 115, total bilirubin 5.9 -Abdominal ultrasound 6/29 showed no acute abnormality, hepatic steatosis -Patient had CT chest abdomen pelvis on 6/14 which had shown no definite acute intrathoracic, abdominal or pelvic pathology, no acute or significant osseous findings.  -MRCP showed hepatic steatosis, trace volume of perihepatic ascites, small right and trace left pleural effusions, mild splenomegaly -follow AMA, anti-SMA, acute hepatitis panel, ANA, ceruloplasmin, AAT, TTG.  -GGT elevated   -Hold statin, doxepin -GI following, will follow recommendations  Acute on chronic normocytic anemia -Hemoglobin 6.7 at the time of admission was 8.4 at the time of recent hospital discharge, baseline around 10 -FOBT negative, received a total of 2 units packed RBC transfusion -Anemia panel consistent with anemia of chronic disease, may have bone marrow depression due to ongoing antibiotics and chronic illness -H&H stable, 9.0  Recent MRSA and enterococcal bacteremia -TTE did not reveal any valvular vegetations, ID had recommended TEE which patient had refused during previous admission -Blood cultures to 6/18 showed no growth, has a right upper extremity PICC line -Continue IV  vancomycin for total of 6 weeks until 7/26   Sinus bradycardia, hypertension -Continue nadolol.  Heart rate controlled  Acute kidney injury -Baseline creatinine 0.6-0.7, likely prerenal due to dehydration, in the setting of diarrhea and anemia.  On ACE inhibitor, vancomycin -Patient was on colchicine and benazepril, which has been placed on hold  -Creatinine improved to 1.2, will KVO fluids   Poorly controlled type 2 diabetes mellitus, IDDM with hyperglycemia -Hemoglobin A1c 10.4 on 6/15 - CBG low, 70 this morning, decrease Lantus to 10 units daily, sliding scale insulin, continue to hold  glipizide  Hyperlipidemia Holding statin due to transaminitis  Dyspnea -Per admission note, shortness of breath was reported however chest x-ray showed no active disease, possibly due to anemia -Resolved, currently on room air    Pressure Injury Documentation: -Sacral decub stage I, wound care consulted, skin intact, except for pinpoint area on the right buttock   Obesity Estimated body mass index is 45.82 kg/m as calculated from the following:   Height as of this encounter: 5' 1"  (1.549 m).   Weight as of this encounter: 110 kg.  Code Status: Full CODE STATUS DVT Prophylaxis:  Lovenox  Family Communication: Discussed all imaging results, lab results, explained to the patient. Called patient's daughter, [name] on phone today   Disposition Plan:     Status is: Inpatient  Remains inpatient appropriate because:Inpatient level of care appropriate due to severity of illness   Dispo: The patient is from: SNF              Anticipated d/c is to: SNF              Anticipated d/c date is: 2 days              Patient currently is not medically stable to d/c.  Worsening transaminitis, need further work-up  Time Spent in minutes 25 minutes  Procedures:  MRCP  Consultants:   Gastroenterology  Antimicrobials:   Anti-infectives (From admission, onward)   Start     Dose/Rate Route Frequency Ordered Stop   08/09/19 1000  vancomycin (VANCOREADY) IVPB 750 mg/150 mL     Discontinue     750 mg 150 mL/hr over 60 Minutes Intravenous Every 24 hours 08/09/19 0145     08/07/19 2200  vancomycin (VANCOCIN) IVPB 1000 mg/200 mL premix        1,000 mg 200 mL/hr over 60 Minutes Intravenous  Once 08/07/19 1837 08/07/19 2328   08/07/19 0832  vancomycin variable dose per unstable renal function (pharmacist dosing)  Status:  Discontinued         Does not apply See admin instructions 08/07/19 0832 08/09/19 0145   08/06/19 1330  vancomycin (VANCOREADY) IVPB 1250 mg/250 mL        1,250  mg 166.7 mL/hr over 90 Minutes Intravenous  Once 08/06/19 1231 08/06/19 1753         Medications  Scheduled Meds: . sodium chloride   Intravenous Once  . allopurinol  300 mg Oral Daily  . amLODipine  10 mg Oral Daily  . vitamin C  500 mg Oral BID  . B-complex with vitamin C  1 tablet Oral Daily  . Chlorhexidine Gluconate Cloth  6 each Topical Daily  . enoxaparin (LOVENOX) injection  40 mg Subcutaneous Q24H  . folic acid  1 mg Oral Daily  . insulin aspart  0-5 Units Subcutaneous QHS  . insulin aspart  0-9 Units Subcutaneous TID WC  . insulin glargine  10  Units Subcutaneous Daily  . nadolol  20 mg Oral Daily  . pantoprazole  40 mg Oral Q0600   Continuous Infusions: . vancomycin 750 mg (08/09/19 1045)   PRN Meds:.acetaminophen **OR** acetaminophen, azelastine **AND** fluticasone, bisacodyl, diphenhydrAMINE-zinc acetate, gadobutrol, polyvinyl alcohol, promethazine      Subjective:   Heather Petty was seen and examined today.  Per patient, feeling okay, no acute complaints.  No acute abdominal pain fever chills or diarrhea.  No nausea or vomiting.    Objective:   Vitals:   08/09/19 0400 08/09/19 0500 08/09/19 0715 08/09/19 1153  BP:   (!) 168/66 (!) 146/59  Pulse: (!) 58 (!) 56 60 61  Resp: (!) 23 20 20  (!) 22  Temp:   99.3 F (37.4 C) 98.8 F (37.1 C)  TempSrc:   Oral Oral  SpO2: 99% 99% 100% 97%  Weight:      Height:        Intake/Output Summary (Last 24 hours) at 08/09/2019 1213 Last data filed at 08/09/2019 0543 Gross per 24 hour  Intake 390 ml  Output 1000 ml  Net -610 ml     Wt Readings from Last 3 Encounters:  08/05/19 110 kg  07/21/19 110 kg  07/21/18 106.3 kg   Physical Exam  General: Alert and oriented x 3, NAD  Cardiovascular: S1 S2 clear, RRR. No pedal edema b/l  Respiratory: CTAB, no wheezing, rales or rhonchi  Gastrointestinal: Soft, nontender, nondistended, NBS  Ext: no pedal edema bilaterally, RU E PICC line  Neuro: no new  deficits  Musculoskeletal: No cyanosis, clubbing  Skin: No rashes  Psych: Normal affect and demeanor, alert and oriented x3    Data Reviewed:  I have personally reviewed following labs and imaging studies  Micro Results Recent Results (from the past 240 hour(s))  SARS CORONAVIRUS 2 (TAT 6-24 HRS) Nasopharyngeal Nasopharyngeal Swab     Status: None   Collection Time: 07/31/19 11:45 AM   Specimen: Nasopharyngeal Swab  Result Value Ref Range Status   SARS Coronavirus 2 NEGATIVE NEGATIVE Final    Comment: (NOTE) SARS-CoV-2 target nucleic acids are NOT DETECTED.  The SARS-CoV-2 RNA is generally detectable in upper and lower respiratory specimens during the acute phase of infection. Negative results do not preclude SARS-CoV-2 infection, do not rule out co-infections with other pathogens, and should not be used as the sole basis for treatment or other patient management decisions. Negative results must be combined with clinical observations, patient history, and epidemiological information. The expected result is Negative.  Fact Sheet for Patients: SugarRoll.be  Fact Sheet for Healthcare Providers: https://www.woods-mathews.com/  This test is not yet approved or cleared by the [place] FDA and  has been authorized for detection and/or diagnosis of SARS-CoV-2 by FDA under an Emergency Use Authorization (EUA). This EUA will remain  in effect (meaning this test can be used) for the duration of the COVID-19 declaration under Se ction 564(b)(1) of the Act, 21 U.S.C. section 360bbb-3(b)(1), unless the authorization is terminated or revoked sooner.  Performed at [facility], [place] [address], [place], [place] [address]   SARS Coronavirus 2 by RT PCR (hospital order, performed in [facility] hospital lab) Nasopharyngeal Nasopharyngeal Swab     Status: None   Collection Time: 08/05/19  7:50 PM   Specimen: Nasopharyngeal Swab  Result  Value Ref Range Status   SARS Coronavirus 2 NEGATIVE NEGATIVE Final    Comment: (NOTE) SARS-CoV-2 target nucleic acids are NOT DETECTED.  The SARS-CoV-2 RNA  is generally detectable in upper and lower respiratory specimens during the acute phase of infection. The lowest concentration of SARS-CoV-2 viral copies this assay can detect is 250 copies / mL. A negative result does not preclude SARS-CoV-2 infection and should not be used as the sole basis for treatment or other patient management decisions.  A negative result may occur with improper specimen collection / handling, submission of specimen other than nasopharyngeal swab, presence of viral mutation(s) within the areas targeted by this assay, and inadequate number of viral copies (<250 copies / mL). A negative result must be combined with clinical observations, patient history, and epidemiological information.  Fact Sheet for Patients:   StrictlyIdeas.no  Fact Sheet for Healthcare Providers: BankingDealers.co.za  This test is not yet approved or  cleared by the [place] FDA and has been authorized for detection and/or diagnosis of SARS-CoV-2 by FDA under an Emergency Use Authorization (EUA).  This EUA will remain in effect (meaning this test can be used) for the duration of the COVID-19 declaration under Section 564(b)(1) of the Act, 21 U.S.C. section 360bbb-3(b)(1), unless the authorization is terminated or revoked sooner.  Performed at [facility], [place] [address] [place], [place] [address]     Radiology Reports CT ABDOMEN PELVIS WO CONTRAST  Addendum Date: 07/22/2019   ADDENDUM REPORT: 07/22/2019 00:43 ADDENDUM: Along the posterior left subcutaneous soft tissues overlying the iliac there is a focal area of ulceration and fat stranding changes. No loculated fluid collections or subcutaneous emphysema. No cortical destruction or bony erosion however seen at the  posterior iliac. Electronically Signed   By: Prudencio Pair M.D.   On: 07/22/2019 00:43   Result Date: 07/22/2019 CLINICAL DATA:  Fever of unknown origin EXAM: CT CHEST, abdomen, and pelvis WITHOUT CONTRAST TECHNIQUE: Multidetector CT imaging of the chest, abdomen and pelvis was performed following the standard protocol without IV contrast. COMPARISON:  July 19, 2018 FINDINGS: Cardiovascular: Scattered aortic atherosclerosis is noted. Coronary artery calcifications are seen. The heart size is normal. There is no pericardial thickening or effusion. Mediastinum/Nodes: There are no enlarged mediastinal, hilar or axillary lymph nodes. The thyroid gland, trachea and esophagus demonstrate no significant findings. Lungs/Pleura: Minimal streaky atelectasis seen at both lung bases. Small amount of tree-in-bud opacity seen at the posterior periphery of the right lung base as on the prior exam. There is a small 5 mm pulmonary nodule at the right lung base which was partially visualized on prior exam. No pleural effusion Upper abdomen: The visualized portion of the upper abdomen is unremarkable. Musculoskeletal/Chest wall: There is no chest wall mass or suspicious osseous finding. 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 Abdomen/pelvis: Hepatobiliary: There is diffuse low density seen throughout the liver parenchyma. No focal hepatic lesion although limited due to the lack of intravenous contrast. Layering hyperdense sludge/small stones are noted. Ill intrahepatic biliary ductal dilatation. Pancreas: Unremarkable.  No surrounding inflammatory changes. Spleen: Normal in size. Although limited due to the lack of intravenous contrast, normal in appearance. Adrenals/Urinary Tract: Both adrenal glands appear normal. The kidneys and collecting system appear normal without evidence of urinary tract calculus or hydronephrosis. Bladder is  unremarkable. Stomach/Bowel: The stomach, small bowel, and colon are normal in appearance. No inflammatory changes or obstructive findings. Diverticulosis without diverticulitis is seen. The appendix is unremarkable. Vascular/Lymphatic: There are no enlarged abdominal or pelvic lymph nodes. Scattered aortic atherosclerotic calcifications are seen without aneurysmal dilatation.  Reproductive: The uterus and adnexa are unremarkable. Other: No evidence of abdominal wall mass or hernia. Musculoskeletal: No acute or significant osseous findings. IMPRESSION: 1. No definite acute intrathoracic, abdominal, or pelvic pathology to explain the patient's symptoms. 2. Unchanged minimal tree-in-bud opacities at the posterior periphery of the right lung base, likely due to chronic inflammatory process. 3.  Cholelithiasis without evidence of acute cholecystitis. 4. 5 mm pulmonary nodule at the right lung base. No follow-up recommended. This recommendation follows the consensus statement: Guidelines for Management of Incidental Pulmonary Nodules Detected on CT Images: From the Fleischner Society 2017; Radiology 2017; 284:228-243. 5.  Aortic Atherosclerosis (ICD10-I70.0). Electronically Signed: By: Prudencio Pair M.D. On: 07/22/2019 00:12   DG Chest 2 View  Result Date: 07/21/2019 CLINICAL DATA:  Suspected sepsis. EXAM: CHEST - 2 VIEW COMPARISON:  Radiograph 07/19/2018 FINDINGS: Low lung volumes. Mild cardiomegaly. Aortic atherosclerosis. Peribronchial thickening. There are streaky bibasilar opacities. No confluent airspace disease. No significant pleural effusion. No pneumothorax. No acute osseous abnormalities are seen. IMPRESSION: 1. Low lung volumes with streaky bibasilar opacities, favoring atelectasis. 2. Peribronchial thickening. 3. Mild cardiomegaly. Electronically Signed   By: Keith Rake M.D.   On: 07/21/2019 22:46   DG Shoulder Right  Result Date: 07/24/2019 CLINICAL DATA:  Unwitnessed fall EXAM: RIGHT SHOULDER -  2+ VIEW COMPARISON:  None. FINDINGS: Degenerative changes in the right [facility] and glenohumeral joints with joint space narrowing and spurring. No acute bony abnormality. Specifically, no fracture, subluxation, or dislocation. IMPRESSION: Degenerative changes.  No acute bony abnormality. Electronically Signed   By: Rolm Baptise M.D.   On: 07/24/2019 20:55   CT Head Wo Contrast  Result Date: 07/21/2019 CLINICAL DATA:  Altered mental status. EXAM: CT HEAD WITHOUT CONTRAST TECHNIQUE: Contiguous axial images were obtained from the base of the skull through the vertex without intravenous contrast. COMPARISON:  Head CT 05/07/2018 FINDINGS: Brain: No intracranial hemorrhage, mass effect, or midline shift. Normal brain volume for age. No hydrocephalus. The basilar cisterns are patent. Mild chronic small vessel ischemia. No evidence of territorial infarct or acute ischemia. No extra-axial or intracranial fluid collection. Vascular: Atherosclerosis of skullbase vasculature without hyperdense vessel or abnormal calcification. Skull: No fracture or focal lesion. Sinuses/Orbits: Paranasal sinuses and mastoid air cells are clear. The visualized orbits are unremarkable. Other: None. IMPRESSION: 1. No acute intracranial abnormality. 2. Mild chronic small vessel ischemia. Electronically Signed   By: Keith Rake M.D.   On: 07/21/2019 22:59   CT Chest Wo Contrast  Addendum Date: 07/22/2019   ADDENDUM REPORT: 07/22/2019 00:43 ADDENDUM: Along the posterior left subcutaneous soft tissues overlying the iliac there is a focal area of ulceration and fat stranding changes. No loculated fluid collections or subcutaneous emphysema. No cortical destruction or bony erosion however seen at the posterior iliac. Electronically Signed   By: Prudencio Pair M.D.   On: 07/22/2019 00:43   Result Date: 07/22/2019 CLINICAL DATA:  Fever of unknown origin EXAM: CT CHEST, abdomen, and pelvis WITHOUT CONTRAST TECHNIQUE: Multidetector CT imaging of the  chest, abdomen and pelvis was performed following the standard protocol without IV contrast. COMPARISON:  July 19, 2018 FINDINGS: Cardiovascular: Scattered aortic atherosclerosis is noted. Coronary artery calcifications are seen. The heart size is normal. There is no pericardial thickening or effusion. Mediastinum/Nodes: There are no enlarged mediastinal, hilar or axillary lymph nodes. The thyroid gland, trachea and esophagus demonstrate no significant findings. Lungs/Pleura: Minimal streaky atelectasis seen at both lung bases. Small amount of tree-in-bud opacity seen at the posterior  periphery of the right lung base as on the prior exam. There is a small 5 mm pulmonary nodule at the right lung base which was partially visualized on prior exam. No pleural effusion Upper abdomen: The visualized portion of the upper abdomen is unremarkable. Musculoskeletal/Chest wall: There is no chest wall mass or suspicious osseous finding. 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 Abdomen/pelvis: Hepatobiliary: There is diffuse low density seen throughout the liver parenchyma. No focal hepatic lesion although limited due to the lack of intravenous contrast. Layering hyperdense sludge/small stones are noted. Ill intrahepatic biliary ductal dilatation. Pancreas: Unremarkable.  No surrounding inflammatory changes. Spleen: Normal in size. Although limited due to the lack of intravenous contrast, normal in appearance. Adrenals/Urinary Tract: Both adrenal glands appear normal. The kidneys and collecting system appear normal without evidence of urinary tract calculus or hydronephrosis. Bladder is unremarkable. Stomach/Bowel: The stomach, small bowel, and colon are normal in appearance. No inflammatory changes or obstructive findings. Diverticulosis without diverticulitis is seen. The appendix is unremarkable. Vascular/Lymphatic: There are no  enlarged abdominal or pelvic lymph nodes. Scattered aortic atherosclerotic calcifications are seen without aneurysmal dilatation. Reproductive: The uterus and adnexa are unremarkable. Other: No evidence of abdominal wall mass or hernia. Musculoskeletal: No acute or significant osseous findings. IMPRESSION: 1. No definite acute intrathoracic, abdominal, or pelvic pathology to explain the patient's symptoms. 2. Unchanged minimal tree-in-bud opacities at the posterior periphery of the right lung base, likely due to chronic inflammatory process. 3.  Cholelithiasis without evidence of acute cholecystitis. 4. 5 mm pulmonary nodule at the right lung base. No follow-up recommended. This recommendation follows the consensus statement: Guidelines for Management of Incidental Pulmonary Nodules Detected on CT Images: From the Fleischner Society 2017; Radiology 2017; [id number]. 5.  Aortic Atherosclerosis (ICD10-I70.0). Electronically Signed: By: Prudencio Pair M.D. On: 07/22/2019 00:12   MR ABDOMEN MRCP WO CONTRAST  Result Date: 08/09/2019 CLINICAL DATA:  69 year old female with history of steatohepatitis. EXAM: MRI ABDOMEN WITHOUT CONTRAST  (INCLUDING MRCP) TECHNIQUE: Multiplanar multisequence MR imaging of the abdomen was performed. Heavily T2-weighted images of the biliary and pancreatic ducts were obtained, and three-dimensional MRCP images were rendered by post processing. COMPARISON:  No priors. FINDINGS: Comment: Today's study is limited for detection and characterization of visceral and/or vascular lesions by lack of IV gadolinium. Lower chest: Small right and trace left pleural effusions lying dependently. Hepatobiliary: Mild diffuse loss of signal intensity throughout the hepatic parenchyma on out of phase dual echo images, indicative of hepatic steatosis. No suspicious hepatic lesions confidently identified on today's noncontrast examination. No intra or extrahepatic biliary ductal dilatation. No filling defects in  the common bile duct to suggest choledocholithiasis. Common bile duct measures 6 mm in the porta hepatis on MRCP images. Tiny filling defects lying dependently in the gallbladder which may reflect a small amount of biliary sludge and/or tiny gallstones. Gallbladder is only moderately distended. No gallbladder wall thickening or pericholecystic fluid. Pancreas: No definite pancreatic mass or peripancreatic fluid collections or inflammatory changes noted on today's noncontrast examination. No pancreatic ductal dilatation noted on MRCP images. Spleen: Spleen is enlarged measuring 10.6 x 6.8 x 11.9 cm (estimated splenic volume of 429 mL). Adrenals/Urinary Tract: Unenhanced appearance of the kidneys and bilateral adrenal glands is unremarkable. No hydroureteronephrosis in the visualized portions of the abdomen. Stomach/Bowel: Visualized portions are unremarkable. Vascular/Lymphatic: No aneurysm identified in the visualized abdominal vasculature. No lymphadenopathy noted in the abdomen. Other:  Trace volume of perihepatic ascites. Musculoskeletal: No aggressive appearing osseous lesions are noted in the visualized portions of the skeleton. IMPRESSION: 1. Hepatic steatosis. 2. Trace volume of perihepatic ascites. 3. Mild splenomegaly. 4. Small right and trace left pleural effusions lying dependently. Electronically Signed   By: Vinnie Langton M.D.   On: 08/09/2019 08:06   DG Chest Port 1 View  Result Date: 08/05/2019 CLINICAL DATA:  Shortness of breath EXAM: PORTABLE CHEST 1 VIEW COMPARISON:  07/23/2019 FINDINGS: No focal opacity or pleural effusion. Stable cardiomediastinal silhouette with aortic atherosclerosis. No pneumothorax. Right upper extremity central venous catheter with tip over the SVC. IMPRESSION: No active disease. Electronically Signed   By: Donavan Foil M.D.   On: 08/05/2019 20:28   DG CHEST PORT 1 VIEW  Result Date: 07/23/2019 CLINICAL DATA:  Tachypnea EXAM: PORTABLE CHEST 1 VIEW COMPARISON:   07/21/2019 FINDINGS: The heart size and mediastinal contours are within normal limits. Aortic atherosclerosis. Both lungs are clear. The visualized skeletal structures are unremarkable. IMPRESSION: No active disease. Electronically Signed   By: Donavan Foil M.D.   On: 07/23/2019 19:39   DG Humerus Right  Result Date: 07/24/2019 CLINICAL DATA:  Unwitnessed fall EXAM: RIGHT HUMERUS - 2+ VIEW COMPARISON:  Shoulder series today FINDINGS: Degenerative changes in the right AC and glenohumeral joints. No acute bony abnormality. Specifically, no fracture, subluxation, or dislocation. IMPRESSION: No acute bony abnormality. Electronically Signed   By: Rolm Baptise M.D.   On: 07/24/2019 20:56   ECHOCARDIOGRAM COMPLETE  Result Date: 07/23/2019    ECHOCARDIOGRAM REPORT   Patient Name:   VONDRA ALDREDGE Date of Exam: 07/23/2019 Medical Rec #:  [id number]        Height:       61.0 in Accession #:    [id number]       Weight:       242.5 lb Date of Birth:  09-Jul-1950        BSA:          2.050 m Patient Age:    89 years         BP:           135/56 mmHg Patient Gender: F                HR:           84 bpm. Exam Location:  Inpatient Procedure: 2D Echo, Cardiac Doppler and Color Doppler Indications:    Bacteremia  History:        Patient has prior history of Echocardiogram examinations, most                 recent 07/20/2018. Risk Factors:Diabetes, Hypertension,                 Dyslipidemia and Former Smoker. MRSA bacteremia.  Sonographer:    Clayton Lefort RDCS (AE) Referring Phys: Baker  1. Left ventricular ejection fraction, by estimation, is 65 to 70%. The left ventricle has normal function. The left ventricle has no regional wall motion abnormalities. There is mild concentric left ventricular hypertrophy. Left ventricular diastolic parameters are consistent with Grade I diastolic dysfunction (impaired relaxation).  2. Right ventricular systolic function is normal. The right ventricular size is normal.  There is normal pulmonary artery systolic pressure. The estimated right ventricular systolic pressure is 25.8 mmHg.  3. The mitral valve is normal in structure. No evidence of mitral valve regurgitation. No evidence of mitral stenosis.  4. The aortic valve  has an indeterminant number of cusps. Aortic valve regurgitation is not visualized. Mild to moderate aortic valve sclerosis/calcification is present, without any evidence of aortic stenosis.  5. The inferior vena cava is normal in size with greater than 50% respiratory variability, suggesting right atrial pressure of 3 mmHg. Conclusion(s)/Recommendation(s): No evidence of valvular vegetations on this transthoracic echocardiogram. Would recommend a transesophageal echocardiogram to exclude infective endocarditis if clinically indicated. FINDINGS  Left Ventricle: Left ventricular ejection fraction, by estimation, is 65 to 70%. The left ventricle has normal function. The left ventricle has no regional wall motion abnormalities. The left ventricular internal cavity size was normal in size. There is  mild concentric left ventricular hypertrophy. Left ventricular diastolic parameters are consistent with Grade I diastolic dysfunction (impaired relaxation). Normal left ventricular filling pressure. Right Ventricle: The right ventricular size is normal. No increase in right ventricular wall thickness. Right ventricular systolic function is normal. There is normal pulmonary artery systolic pressure. The tricuspid regurgitant velocity is 2.48 m/s, and  with an assumed right atrial pressure of 3 mmHg, the estimated right ventricular systolic pressure is 84.6 mmHg. Left Atrium: Left atrial size was normal in size. Right Atrium: Right atrial size was normal in size. Pericardium: There is no evidence of pericardial effusion. Mitral Valve: The mitral valve is normal in structure. Normal mobility of the mitral valve leaflets. Mild mitral annular calcification. No evidence of  mitral valve regurgitation. No evidence of mitral valve stenosis. MV peak gradient, 3.5 mmHg. The mean mitral valve gradient is 2.0 mmHg. Tricuspid Valve: The tricuspid valve is normal in structure. Tricuspid valve regurgitation is trivial. No evidence of tricuspid stenosis. Aortic Valve: The aortic valve has an indeterminant number of cusps. Aortic valve regurgitation is not visualized. Mild to moderate aortic valve sclerosis/calcification is present, without any evidence of aortic stenosis. Aortic valve mean gradient measures 5.0 mmHg. Aortic valve peak gradient measures 12.7 mmHg. Aortic valve area, by VTI measures 2.20 cm. Pulmonic Valve: The pulmonic valve was normal in structure. Pulmonic valve regurgitation is not visualized. No evidence of pulmonic stenosis. Aorta: The aortic root is normal in size and structure. Venous: The inferior vena cava is normal in size with greater than 50% respiratory variability, suggesting right atrial pressure of 3 mmHg. IAS/Shunts: The interatrial septum appears to be lipomatous. No atrial level shunt detected by color flow Doppler.  LEFT VENTRICLE PLAX 2D LVIDd:         3.50 cm  Diastology LVIDs:         2.20 cm  LV e' lateral:   8.38 cm/s LV PW:         1.20 cm  LV E/e' lateral: 9.9 LV IVS:        1.10 cm  LV e' medial:    7.94 cm/s LVOT diam:     1.70 cm  LV E/e' medial:  10.4 LV SV:         64 LV SV Index:   31 LVOT Area:     2.27 cm  RIGHT VENTRICLE             IVC RV Basal diam:  3.00 cm     IVC diam: 1.50 cm RV S prime:     11.00 cm/s TAPSE (M-mode): 1.6 cm LEFT ATRIUM             Index       RIGHT ATRIUM           Index LA diam:  2.40 cm 1.17 cm/m  RA Area:     11.10 cm LA Vol (A2C):   54.7 ml 26.68 ml/m RA Volume:   22.60 ml  11.02 ml/m LA Vol (A4C):   36.9 ml 18.00 ml/m LA Biplane Vol: 45.4 ml 22.15 ml/m  AORTIC VALVE AV Area (Vmax):    2.10 cm AV Area (Vmean):   2.23 cm AV Area (VTI):     2.20 cm AV Vmax:           178.00 cm/s AV Vmean:           109.000 cm/s AV VTI:            0.293 m AV Peak Grad:      12.7 mmHg AV Mean Grad:      5.0 mmHg LVOT Vmax:         165.00 cm/s LVOT Vmean:        107.000 cm/s LVOT VTI:          0.284 m LVOT/AV VTI ratio: 0.97  AORTA Ao Root diam: 2.60 cm Ao Asc diam:  3.20 cm MITRAL VALVE                TRICUSPID VALVE MV Area (PHT): 2.69 cm     TR Peak grad:   24.6 mmHg MV Peak grad:  3.5 mmHg     TR Vmax:        248.00 cm/s MV Mean grad:  2.0 mmHg MV Vmax:       0.94 m/s     SHUNTS MV Vmean:      58.4 cm/s    Systemic VTI:  0.28 m MV Decel Time: 282 msec     Systemic Diam: 1.70 cm MV E velocity: 82.60 cm/s MV A velocity: 102.00 cm/s MV E/A ratio:  0.81 Fransico Him MD Electronically signed by Fransico Him MD Signature Date/Time: 07/23/2019/4:37:10 PM    Final    [place] EKG SITE RITE  Result Date: 07/28/2019 If Site Rite image not attached, placement could not be confirmed due to current cardiac rhythm.  US Abdomen Limited RUQ  Result Date: 08/05/2019 CLINICAL DATA:  Elevated LFTs EXAM: ULTRASOUND ABDOMEN LIMITED RIGHT UPPER QUADRANT COMPARISON:  CT dated July 21, 2019 FINDINGS: Gallbladder: No gallstones or wall thickening visualized. No sonographic Murphy sign noted by sonographer. Common bile duct: Diameter: 6 mm Liver: Diffuse increased echogenicity with slightly heterogeneous liver. Appearance typically secondary to fatty infiltration. Fibrosis secondary consideration. No secondary findings of cirrhosis noted. No focal hepatic lesion or intrahepatic biliary duct dilatation. Portal vein is patent on color Doppler imaging with normal direction of blood flow towards the liver. Other: None. IMPRESSION: 1. No acute abnormality. 2. Hepatic steatosis. Electronically Signed   By: Constance Holster M.D.   On: 08/05/2019 22:30    Lab Data:  CBC: Recent Labs  Lab 08/05/19 1329 08/05/19 1329 08/06/19 0054 08/07/19 0007 08/07/19 0316 08/08/19 0450 08/09/19 0345  WBC 5.7  --   --   --  6.4 4.8 4.8  HGB 6.7*   < > 7.4*  8.4* 8.9* 8.6* 9.0*  HCT 22.5*   < > 24.4* 27.0* 27.8* 26.7* 29.0*  MCV 98.3  --   --   --  93.9 94.7 94.8  PLT 229  --   --   --  211 196 193   < > = values in this interval not displayed.   Basic Metabolic Panel: Recent Labs  Lab 08/05/19 1329 08/05/19 1329 08/06/19 0054 08/06/19  1505 08/07/19 0316 08/07/19 0316 08/07/19 1653 08/08/19 0450 08/08/19 1000 08/08/19 2200 08/09/19 0345  NA 141  --  140  --  140  --   --  140  --   --  141  K 4.0  --  4.0  --  4.2  --   --  3.8  --   --  3.7  CL 112*  --  113*  --  113*  --   --  112*  --   --  110  CO2 21*  --  21*  --  21*  --   --  21*  --   --  23  GLUCOSE 232*  --  225*  --  88  --   --  88  --   --  63*  BUN 22  --  23  --  22  --   --  15  --   --  13  CREATININE 1.51*   < > 1.58*   < > 1.42*   < > 1.30* 1.22* 1.27* 1.28* 1.27*  CALCIUM 8.3*  --  8.4*  --  8.3*  --   --  8.6*  --   --  8.7*   < > = values in this interval not displayed.   GFR: Estimated Creatinine Clearance: 47.3 mL/min (A) (by C-G formula based on SCr of 1.27 mg/dL (H)). Liver Function Tests: Recent Labs  Lab 08/05/19 1329 08/06/19 0054 08/07/19 0316 08/08/19 0450 08/09/19 0345  AST 36 24 18 63* 111*  ALT 78* 66* 45* 58* 115*  ALKPHOS 440* 370* 272* 448* 479*  BILITOT 2.0* 2.1* 1.9* 3.9* 5.9*  PROT 5.9* 5.7* 5.7* 5.3* 5.7*  ALBUMIN 2.6* 2.5* 2.5* 2.3* 2.5*   Recent Labs  Lab 08/05/19 1329  LIPASE 42   No results for input(s): AMMONIA in the last 168 hours. Coagulation Profile: Recent Labs  Lab 08/08/19 1042  INR 1.2   Cardiac Enzymes: No results for input(s): CKTOTAL, CKMB, CKMBINDEX, TROPONINI in the last 168 hours. BNP (last 3 results) No results for input(s): PROBNP in the last 8760 hours. HbA1C: No results for input(s): HGBA1C in the last 72 hours. CBG: Recent Labs  Lab 08/08/19 1155 08/08/19 1638 08/08/19 2214 08/09/19 0620 08/09/19 1132  GLUCAP 189* 115* 78 70 113*   Lipid Profile: No results for input(s): CHOL,  HDL, LDLCALC, TRIG, CHOLHDL, LDLDIRECT in the last 72 hours. Thyroid Function Tests: No results for input(s): TSH, T4TOTAL, FREET4, T3FREE, THYROIDAB in the last 72 hours. Anemia Panel: No results for input(s): VITAMINB12, FOLATE, FERRITIN, TIBC, IRON, RETICCTPCT in the last 72 hours. Urine analysis:    Component Value Date/Time   COLORURINE AMBER (A) 07/28/2019 1236   APPEARANCEUR CLEAR 07/28/2019 1236   LABSPEC 1.014 07/28/2019 1236   PHURINE 7.0 07/28/2019 1236   GLUCOSEU NEGATIVE 07/28/2019 1236   HGBUR NEGATIVE 07/28/2019 1236   BILIRUBINUR NEGATIVE 07/28/2019 1236   KETONESUR NEGATIVE 07/28/2019 1236   PROTEINUR 30 (A) 07/28/2019 1236   UROBILINOGEN 0.2 12/27/2010 1858   NITRITE NEGATIVE 07/28/2019 1236   LEUKOCYTESUR NEGATIVE 07/28/2019 1236     Lenox Ladouceur M.D. [facility] 08/09/2019, 12:13 PM   Call night coverage person covering after 7pm

## 2019-08-09 NOTE — Progress Notes (Signed)
Pharmacy Antibiotic Note  Heather Petty is a 69 y.o. female admitted on 08/05/2019 with low hemoglobin. Pt had recent hospital admission for DKA, 07/21/19- 08/01/19 and was discharged 08/01/19 to SNF on vancomycin 1 gm IV q12hr x 6 wks, end date 7/26/21or MRSA and enterococcal bacteremia, secondary to infected sacral decubitus ulcer and UTI. She was readmitted 6/29 AM due to low Hgb, diarrhea, abdominal pain, and shortness of breath.   Pharmacy has been consulted to resume vancomycin dosing for recent MRSA and enterococcal bacteremia, for 6 weeks total, with end date of 09/01/19.   Pt also presented with diarrhea; however, abd exam benign, no fever or leukocytosis; stool studies pending  PTA vancomycin regimen: 1g IV q12hr to continue for 6 weeks, with end date 09/01/19 (per SNF MAR last given PTA on 6/27 at 22:27 PM, so pt rec'd no vancomycin on 6/28, 6/29)  Vancomycin trough on 6/23 (previous admission) was 20 mg/L on 1 g q 12 hrs regimen (Scr stable at 0.7 on 6/23). Pt admitted with AKI on 08/05/19, with SCr 1.51 > 1.58>1.42>1.30>1.27 (this afternoon) (baseline SCr 0.6-0.7).   Vancomycin random level at 0906 AM on 08/06/19 was 19 mg/L. Pt rec'd vancomycin 1250 mg IV X 1 6/30 at 1623 PM. Vancomycin random level 7/1 at 1653 PM (~24 hrs after vancomycin dose) was 23 mg/L. Scr drawn at same time as vancomycin random level was down to 1.30; CrCl ~46.2 ml/min (renal function improving, but not stable, at this point).  Level drawn AM 7/2 at 1000 (~12 hours from last dose) =27. Scr stable but still 2X baseline. Will get level at ~24 hours (2200 tonight) to calculate elimination rate constant and calculate dose   WBC 4.8, afebrile  7/3 AM update:  Ke ~0.021 Renal function stable last few checks but still almost double from baseline  Plan: Start vancomycin 750 mg IV q24h later this AM Re-check trough as needed Monitor daily Scr  Goal vancomycin trough level: 15-20 mg/L Vanc to continue thru 09/01/19 (6  weeks total) Monitor WBC, temp, clinical progress, cultures, renal function, vancomycin levels as indicated  Height: 5' 1"  (154.9 cm) Weight: 110 kg (242 lb 8.1 oz) IBW/kg (Calculated) : 47.8  Temp (24hrs), Avg:98.6 F (37 C), Min:98.3 F (36.8 C), Max:98.9 F (37.2 C)  Recent Labs  Lab 08/05/19 1329 08/06/19 0054 08/06/19 0906 08/07/19 0316 08/07/19 1653 08/07/19 1653 08/08/19 0450 08/08/19 1000 08/08/19 2200  WBC 5.7  --   --  6.4  --   --  4.8  --   --   CREATININE 1.51*   < >  --  1.42* 1.30*  --  1.22* 1.27* 1.28*  VANCORANDOM  --    < >   < >  --  23   < >  --  27 21   < > = values in this interval not displayed.    Estimated Creatinine Clearance: 46.9 mL/min (A) (by C-G formula based on SCr of 1.28 mg/dL (H)).    Allergies  Allergen Reactions   Lasix [Furosemide] Other (See Comments)    IV-LASIX ==> Reaction: Burning    Antimicrobials this admission: Vancomycin started on previous admission 6/14>> (Planned end date 7.26.21)  (per SNF MAR, LD given 6/27 2230, thus none on 6/28, 6/29)    Microbiology results: 6/29 COVID: negative  Previous admission microbiology results:  6/14 BCID: MRSA, Enterococcus, staph hominis 6/14 Ucx: 80,000 Klebsiella pneumoniae (susceptible to all abx tested except ampicillin) 6/14 Bcx: 2/2 MRSA per BCID  6/15 Bcx X2: MRSA 6/17 Bcx X 2: MRSA 6/18 Bcx X 2: NG/final 6/21 Ucx: NG/final  Narda Bonds, PharmD, BCPS Clinical Pharmacist Phone: [phone]

## 2019-08-10 ENCOUNTER — Inpatient Hospital Stay (HOSPITAL_COMMUNITY): Payer: Medicare Other

## 2019-08-10 DIAGNOSIS — R161 Splenomegaly, not elsewhere classified: Secondary | ICD-10-CM

## 2019-08-10 DIAGNOSIS — R7989 Other specified abnormal findings of blood chemistry: Secondary | ICD-10-CM | POA: Diagnosis not present

## 2019-08-10 LAB — COMPREHENSIVE METABOLIC PANEL
ALT: 130 U/L — ABNORMAL HIGH (ref 0–44)
AST: 95 U/L — ABNORMAL HIGH (ref 15–41)
Albumin: 2.5 g/dL — ABNORMAL LOW (ref 3.5–5.0)
Alkaline Phosphatase: 420 U/L — ABNORMAL HIGH (ref 38–126)
Anion gap: 8 (ref 5–15)
BUN: 14 mg/dL (ref 8–23)
CO2: 24 mmol/L (ref 22–32)
Calcium: 8.3 mg/dL — ABNORMAL LOW (ref 8.9–10.3)
Chloride: 105 mmol/L (ref 98–111)
Creatinine, Ser: 1.26 mg/dL — ABNORMAL HIGH (ref 0.44–1.00)
GFR calc Af Amer: 50 mL/min — ABNORMAL LOW (ref >60)
GFR calc non Af Amer: 43 mL/min — ABNORMAL LOW (ref >60)
Glucose, Bld: 101 mg/dL — ABNORMAL HIGH (ref 70–99)
Potassium: 3.6 mmol/L (ref 3.5–5.1)
Sodium: 137 mmol/L (ref 135–145)
Total Bilirubin: 7.2 mg/dL — ABNORMAL HIGH (ref 0.3–1.2)
Total Protein: 5.6 g/dL — ABNORMAL LOW (ref 6.5–8.1)

## 2019-08-10 LAB — CBC
HCT: 30.1 % — ABNORMAL LOW (ref 36.0–46.0)
Hemoglobin: 9.5 g/dL — ABNORMAL LOW (ref 12.0–15.0)
MCH: 30.3 pg (ref 26.0–34.0)
MCHC: 31.6 g/dL (ref 30.0–36.0)
MCV: 95.9 fL (ref 80.0–100.0)
Platelets: 180 10*3/uL (ref 150–400)
RBC: 3.14 MIL/uL — ABNORMAL LOW (ref 3.87–5.11)
RDW: 18.9 % — ABNORMAL HIGH (ref 11.5–15.5)
WBC: 4.4 10*3/uL (ref 4.0–10.5)
nRBC: 0 % (ref 0.0–0.2)

## 2019-08-10 LAB — PROCALCITONIN: Procalcitonin: 2.76 ng/mL

## 2019-08-10 LAB — GLUCOSE, CAPILLARY
Glucose-Capillary: 91 mg/dL (ref 70–99)
Glucose-Capillary: 111 mg/dL — ABNORMAL HIGH (ref 70–99)
Glucose-Capillary: 110 mg/dL — ABNORMAL HIGH (ref 70–99)
Glucose-Capillary: 156 mg/dL — ABNORMAL HIGH (ref 70–99)

## 2019-08-10 LAB — URINALYSIS, ROUTINE W REFLEX MICROSCOPIC
Bilirubin Urine: NEGATIVE
Glucose, UA: NEGATIVE mg/dL
Hgb urine dipstick: NEGATIVE
Ketones, ur: 5 mg/dL — AB
Nitrite: POSITIVE — AB
Protein, ur: NEGATIVE mg/dL
Specific Gravity, Urine: 1.012 (ref 1.005–1.030)
pH: 5 (ref 5.0–8.0)

## 2019-08-10 LAB — LACTIC ACID, PLASMA
Lactic Acid, Venous: 0.9 mmol/L (ref 0.5–1.9)
Lactic Acid, Venous: 0.9 mmol/L (ref 0.5–1.9)

## 2019-08-10 LAB — IGG, IGA, IGM
IgA: 234 mg/dL (ref 87–352)
IgG (Immunoglobin G), Serum: 879 mg/dL (ref 586–1602)
IgM (Immunoglobulin M), Srm: 194 mg/dL (ref 26–217)

## 2019-08-10 LAB — CERULOPLASMIN: Ceruloplasmin: 36.3 mg/dL (ref 19.0–39.0)

## 2019-08-10 LAB — BILIRUBIN, FRACTIONATED(TOT/DIR/INDIR)
Bilirubin, Direct: 5.4 mg/dL — ABNORMAL HIGH (ref 0.0–0.2)
Indirect Bilirubin: 2.4 mg/dL — ABNORMAL HIGH (ref 0.3–0.9)
Total Bilirubin: 7.8 mg/dL — ABNORMAL HIGH (ref 0.3–1.2)

## 2019-08-10 LAB — MITOCHONDRIAL ANTIBODIES: Mitochondrial M2 Ab, IgG: <20 Units (ref 0.0–20.0)

## 2019-08-10 LAB — ANTI-SMOOTH MUSCLE ANTIBODY, IGG: F-Actin IgG: 10 Units (ref 0–19)

## 2019-08-10 LAB — ALPHA-1-ANTITRYPSIN: A-1 Antitrypsin, Ser: 201 mg/dL — ABNORMAL HIGH (ref 101–187)

## 2019-08-10 MED ORDER — SODIUM CHLORIDE 0.9 % IV SOLN
2.0000 g | INTRAVENOUS | Status: DC
  Administered 2019-08-10: 2 g via INTRAVENOUS
  Filled 2019-08-10 (×2): qty 20

## 2019-08-10 MED ORDER — METRONIDAZOLE IN NACL 5-0.79 MG/ML-% IV SOLN
500.0000 mg | Freq: Three times a day (TID) | INTRAVENOUS | Status: DC
  Administered 2019-08-10 – 2019-08-11 (×2): 500 mg via INTRAVENOUS
  Filled 2019-08-10 (×2): qty 100

## 2019-08-10 MED ORDER — ONDANSETRON HCL 4 MG/2ML IJ SOLN: 4.0000 mg | Freq: Four times a day (QID) | INTRAMUSCULAR | Status: DC | PRN

## 2019-08-10 NOTE — Progress Notes (Signed)
°   08/10/19 1530  Assess: MEWS Score  Temp (!) 102.4 F (39.1 C)  BP (!) 155/54  Pulse Rate 71  ECG Heart Rate 71  Resp (!) 27  Level of Consciousness Alert  SpO2 92 %  O2 Device Room Air  Assess: MEWS Score  MEWS Temp 2  MEWS Systolic 0  MEWS Pulse 0  MEWS RR 2  MEWS LOC 0  MEWS Score 4  MEWS Score Color Red  Assess: if the MEWS score is Yellow or Red  Were vital signs taken at a resting state? Yes  Focused Assessment Documented focused assessment  Early Detection of Sepsis Score *See Row Information* Medium  MEWS guidelines implemented *See Row Information* Yes  Treat  MEWS Interventions Escalated (See documentation below)  Take Vital Signs  Increase Vital Sign Frequency  Red: Q 1hr X 4 then Q 4hr X 4, if remains red, continue Q 4hrs  Escalate  MEWS: Escalate Red: discuss with charge nurse/RN and provider, consider discussing with RRT  Notify: Charge Nurse/RN  Name of Charge Nurse/RN Notified Angela, RN  Date Charge Nurse/RN Notified 08/10/19  Time Charge Nurse/RN Notified 1530  Notify: Provider  Provider Name/Title Dr. Tyler Pita, MD  Date Provider Notified 08/10/19  Time Provider Notified 1530  Notification Type Page  Notification Reason Change in status (MEWS change)  Response See new orders (STAT CXR, blood cultures x2 per Dr. Tana Coast., MD)  Date of Provider Response 08/10/19  Time of Provider Response 1530  Notify: Rapid Response  Name of Rapid Response RN Notified Saralyn Pilar, RN  Date Rapid Response Notified 08/10/19  Time Rapid Response Notified 1556  Document  Patient Outcome Other (Comment) (Pending labs, CXR)  Ressassessment of VS per guidelines, Charge nurse Levada Dy, RN notified and Dr. Tana Coast, MD notified.  See orders placed by Dr. Tana Coast MD CXR (STAT), pro-calcitonin, lactic acid, blood cultures x2, rocephin, Flagyl, urinalysis.

## 2019-08-10 NOTE — Progress Notes (Signed)
°   08/10/19 1519  Assess: MEWS Score  Temp (!) 102.3 F (39.1 C)  BP (!) 161/62  Pulse Rate 73  ECG Heart Rate 72  Resp (!) 27  SpO2 94 %  O2 Device Room Air  Assess: MEWS Score  MEWS Temp 2  MEWS Systolic 0  MEWS Pulse 0  MEWS RR 2  MEWS LOC 0  MEWS Score 4  MEWS Score Color Red  Assess: if the MEWS score is Yellow or Red  Were vital signs taken at a resting state? Yes  Focused Assessment Documented focused assessment  Early Detection of Sepsis Score *See Row Information* Medium  MEWS guidelines implemented *See Row Information* Yes  Treat  MEWS Interventions Other (Comment) (RN notified at 1529)   Performed reassessment of vital signs at 1530.

## 2019-08-10 NOTE — Progress Notes (Addendum)
Pharmacy Antibiotic Note  Heather Petty is a 69 y.o. female admitted on 08/05/2019 with MRSA and enterococcal bacteremia, klebsiella UTI, and possible cholangitis. Patient was recently discharged 08/01/19 to SNF on vancomycin 1 gm IV q12hr x 6 wks, end date 7/26/21or MRSA and enterococcal bacteremia, secondary to infected sacral decubitus ulcer and UTI. TTE negative for valvular vegetations, patient refused TTE.   Pharmacy has been consulted for vancomycin and piperacillin-tazobactam dosing. Upon further discussion with MD, agreed to change pip/tazo to ceftriaxone and metronidazole.  WBC wnl. Tmax 102.4 despite therapeutic vancomycin dosing. Scr stable, ClCr ~47 ml/min.   Plan: Start ceftriaxone 2g Q24hr  Start metronidazole 534m Q 8 hr Continue Vancomycin 7519mQ24hr  F/u vanc troughs weekly or sooner if renal function worsens, next due ~7/9    Monitor cultures, clinical status, renal fx,  Narrow abx as able and f/u duration    Height: 5' 1"  (154.9 cm) Weight: 110 kg (242 lb 8.1 oz) IBW/kg (Calculated) : 47.8  Temp (24hrs), Avg:99.9 F (37.7 C), Min:98.1 F (36.7 C), Max:102.4 F (39.1 C)  Recent Labs  Lab 08/05/19 1329 08/06/19 0054 08/07/19 0316 08/07/19 1653 08/08/19 0450 08/08/19 1000 08/08/19 2200 08/09/19 0345 08/10/19 0425 08/10/19 0722  WBC 5.7  --  6.4  --  4.8  --   --  4.8  --  4.4  CREATININE 1.51*   < > 1.42*   < > 1.22* 1.27* 1.28* 1.27* 1.26*  --   VANCORANDOM  --    < >  --    < >  --  27 21  --   --   --    < > = values in this interval not displayed.    Estimated Creatinine Clearance: 47.7 mL/min (A) (by C-G formula based on SCr of 1.26 mg/dL (H)).    Allergies  Allergen Reactions  . Lasix [Furosemide] Other (See Comments)    IV-LASIX ==> Reaction: Burning    Antimicrobials this admission: Vancomycin started previous admission 6/14>> (Planned end date 7.26.21; per SNF MAR, LD given 6/27 2230, thus none on 6/28, 6/29)  CTX 7/4 >>  MTZ 7/4 >>    Dose adjustments this admission: 6/23 VT= 20 on 1g Q12 hr  6/30 VR= 19 (drawn 16.5hr from [id number]) 7/1 VT = 23 on 1250 Q24hr > decr to 1g Q24hr 7/2 12hr VR = 27  7/2 VT= 21 on 1g Q24hr > decr to 750m32m4 hr  Previous admission microbiology results:  6/14 BCID: MRSA, Enterococcus, staph hominis 6/14 Ucx: 80,000 Klebsiella pneumoniae (susceptible to all abx tested except ampicillin) 6/14 Bcx: 2/2 MRSA per BCID  6/15 Bcx X2: MRSA 6/17 Bcx X 2: MRSA 6/18 Bcx X 2: NG/final 6/21 Ucx: NG/final  Thank you for allowing pharmacy to be a part of this patient's care.  LydiBenetta SpararmD, BCPS, BCCP Clinical Pharmacist  Please check AMION for all [facility] numbers After 10:00 PM, call [phone]

## 2019-08-10 NOTE — Progress Notes (Signed)
Progress Note  CC:    Elevated LFTs     ASSESSMENT AND PLAN:   Heather Petty a 69 y.o.femalepmh significant for but not limited to asthma, depression, insulin-dependent type 2 diabetes, gout, hypertension, hyperlipidemia, morbid obesity, schizophrenia, CVA   # Acute on chronic elevation of LFTs.  --Mixed pattern of elevation but mainly cholestatic. Alk phos elevated since at least 2019 and transaminases elevated for > a year. Hyperbilirubinemia more of a new problem and progressive. Total bili trending up ( 7.8) , predominantly direct but no evidence for biliary obstruction on imaging.  -- Labs: Alk phos stable, ALT up some 115 >> 130. INR stable at 1.2. Platelets okay at 180.  ---Acute on chronic elevation in LFTs could be secondary to recent infection and / or DILI from antibiotics. She does have hepatic steatosis on imaging. Also, concern for early cirrhosis with mild splenomegaly and mild perihepatic ascites. INR 1.2, platelets 180.  --Hepatic steatosis on imaging. Concern for early cirrhosis with mild splenomegaly and mild perihepatic ascites. .  --Markedly elevated ferritin of 1095. Normal TIBC and iron sat. Could be acute phase reaction ( recently hospitalized with sepsis) --HBV, HCV negative. Her HB S ab is negative, should get outpatient vaccination. 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   # Cholelithiasis vrs gallbladder sludge.  --No evidence for cholecystitis nor choledocholithiasis.   # [place] anemia --Hgb improved and stable at 9.5, up from 6.7 following 2 u PRBC --no overt GI bleeding --celiac studies pending --Will need outpatient colonoscopy       SUBJECTIVE    Nausea, some periumbilical discomfort .Main complaint is a tooth ache   OBJECTIVE:     Vital signs in last 24 hours: Temp:  [98.1 F (36.7 C)-99.7 F (37.6 C)] 99  F (37.2 C) (07/04 1212) Pulse Rate:  [56-71] 71 (07/04 1212) Resp:  [17-25] 21 (07/04 1212) BP: (130-153)/(52-116) 130/115 (07/04 1212) SpO2:  [97 %-100 %] 99 % (07/04 1212) Last BM Date: 08/09/19 General:   Alert, in NAD Heart:  Regular rate and rhythm.  No lower extremity edema   Pulm: Normal respiratory effort   Abdomen:  Soft,  nontender, mild periumbilical tenderness.   Normal bowel sounds.          Neurologic:  Alert and  oriented,  grossly normal neurologically. Psych:  Pleasant, cooperative.  Normal mood and affect.   Intake/Output from previous day: 07/03 0701 - 07/04 0700 In: 150 [IV Piggyback:150] Out: 1500 [Urine:1500] Intake/Output this shift: Total I/O In: 150 [P.O.:150] Out: 250 [Urine:250]  Lab Results: Recent Labs    08/08/19 0450 08/09/19 0345 08/10/19 0722  WBC 4.8 4.8 4.4  HGB 8.6* 9.0* 9.5*  HCT 26.7* 29.0* 30.1*  PLT 196 193 180   BMET Recent Labs    08/08/19 0450 08/08/19 1000 08/08/19 2200 08/09/19 0345 08/10/19 0425  NA 140  --   --  141 137  K 3.8  --   --  3.7 3.6  CL 112*  --   --  110 105  CO2 21*  --   --  23 24  GLUCOSE 88  --   --  63* 101*  BUN 15  --   --  13 14  CREATININE 1.22*   < > 1.28* 1.27* 1.26*  CALCIUM 8.6*  --   --  8.7* 8.3*   < > =  values in this interval not displayed.   LFT Recent Labs    08/10/19 0425  PROT 5.6*  ALBUMIN 2.5*  AST 95*  ALT 130*  ALKPHOS 420*  BILITOT 7.2*   PT/INR Recent Labs    08/08/19 1042  LABPROT 14.3  INR 1.2   Hepatitis Panel Recent Labs    08/09/19 0345  HEPBSAG NON REACTIVE  HCVAB NON REACTIVE    MR ABDOMEN MRCP WO CONTRAST  Result Date: 08/09/2019 CLINICAL DATA:  69 year old female with history of steatohepatitis. EXAM: MRI ABDOMEN WITHOUT CONTRAST  (INCLUDING MRCP) TECHNIQUE: Multiplanar multisequence MR imaging of the abdomen was performed. Heavily T2-weighted images of the biliary and pancreatic ducts were obtained, and three-dimensional MRCP images were  rendered by post processing. COMPARISON:  No priors. FINDINGS: Comment: Today's study is limited for detection and characterization of visceral and/or vascular lesions by lack of IV gadolinium. Lower chest: Small right and trace left pleural effusions lying dependently. Hepatobiliary: Mild diffuse loss of signal intensity throughout the hepatic parenchyma on out of phase dual echo images, indicative of hepatic steatosis. No suspicious hepatic lesions confidently identified on today's noncontrast examination. No intra or extrahepatic biliary ductal dilatation. No filling defects in the common bile duct to suggest choledocholithiasis. Common bile duct measures 6 mm in the porta hepatis on MRCP images. Tiny filling defects lying dependently in the gallbladder which may reflect a small amount of biliary sludge and/or tiny gallstones. Gallbladder is only moderately distended. No gallbladder wall thickening or pericholecystic fluid. Pancreas: No definite pancreatic mass or peripancreatic fluid collections or inflammatory changes noted on today's noncontrast examination. No pancreatic ductal dilatation noted on MRCP images. Spleen: Spleen is enlarged measuring 10.6 x 6.8 x 11.9 cm (estimated splenic volume of 429 mL). Adrenals/Urinary Tract: Unenhanced appearance of the kidneys and bilateral adrenal glands is unremarkable. No hydroureteronephrosis in the visualized portions of the abdomen. Stomach/Bowel: Visualized portions are unremarkable. Vascular/Lymphatic: No aneurysm identified in the visualized abdominal vasculature. No lymphadenopathy noted in the abdomen. Other:  Trace volume of perihepatic ascites. Musculoskeletal: No aggressive appearing osseous lesions are noted in the visualized portions of the skeleton. IMPRESSION: 1. Hepatic steatosis. 2. Trace volume of perihepatic ascites. 3. Mild splenomegaly. 4. Small right and trace left pleural effusions lying dependently. Electronically Signed   By: Vinnie Langton  M.D.   On: 08/09/2019 08:06     Principal Problem:   Diarrhea Active Problems:   HTN (hypertension)   Stage II pressure ulcer of sacral region ([place])   MRSA bacteremia   Anemia     LOS: 5 days   [name] ,NP 08/10/2019, 12:49 PM

## 2019-08-10 NOTE — Progress Notes (Signed)
Verbal order with read back from Dr. Tana Coast, MD. Chest X-ray STAT for MEWS 4, febrile (temp 102.4), tachypnea RR 25-27  Verbal order with read back for blood cultures x2. MEWS protocol followed.

## 2019-08-10 NOTE — Progress Notes (Signed)
Red MEWS Alert.   Pt seen and examined, .  She is febrile, tachypnea, in mild distress from nausea, and abdominal pain, reports dysuria.  Lungs : diminished air entry at bases, tachypnea present, no wheezing or rhonchi.  CVS:  s1s2 heard, RRR, no JVD, no pedal edema.  Abdomen is soft, mild generalized tenderness, no peritoneal signs, bowel sounds wnl. Non distended.  Extremities: no pedal edema.  Neuro: Alert, able to answer all questions, oriented to place and person and able to move all extremities.   Plan:  Fever work up with blood cultures, lactic acid, pro calcitonin.  CXR, UA, and repeat abdominal imaging if no improvement with symptomatic management.  Rocephin with flagyl for possible cholangitis and rising bilirubin levels.    Hosie Poisson , MD.

## 2019-08-10 NOTE — Progress Notes (Signed)
Responded to call from patient's RN regarding red MEWs score.  Patient was febrile at 102.4 F and a RR 25-27.  Patient AO x 4 and appeared to be uncomfortable.  She complained that her stomach was hurting and that she was cold.   RN spoke to the Dr. and received orders for blood cultures, lactic acid, procalcitonin and a stat chest xray.  We helped the RN reposition the patient, uncovered patient, and put ice packs on the patient.  RN retook the temp before we left and it had come down to 101 F.

## 2019-08-10 NOTE — Progress Notes (Signed)
Rapid response came to bedside to assess patient. Saralyn Pilar, RN and [place], RN provided recommendations for lactic acid lab draw and ice packs to axilla and groin.  Called Dr. Tana Coast. MD for lactic acid order. Continue to follow MEWS protocol.   08/10/19 1607  Vitals  Temp (!) 102.3 F (39.1 C)  Temp Source Oral  BP (!) 141/49  MAP (mmHg) 75  BP Location Right Arm  BP Method Automatic  Patient Position (if appropriate) Lying  Pulse Rate 68  Pulse Rate Source Monitor  ECG Heart Rate 69  Cardiac Rhythm NSR  Ectopy PAC  New onset of dysrhythmia? No  Resp (!) 27  Level of Consciousness  Level of Consciousness Alert  Oxygen Therapy  SpO2 92 %  O2 Device Room Air  Pain Assessment  Pain Scale 0-10  Pain Score 2  Pain Type Acute pain  Pain Location  (stomach)  MEWS Score  MEWS Temp 2  MEWS Systolic 0  MEWS Pulse 0  MEWS RR 2  MEWS LOC 0  MEWS Score 4  MEWS Score Color Red

## 2019-08-10 NOTE — Progress Notes (Signed)
Reassessment following interventions, Tylenol po, ice packs axilla/groin.

## 2019-08-10 NOTE — Progress Notes (Signed)
   08/10/19 1628  Assess: MEWS Score  Temp (!) 101.9 F (38.8 C)  BP (!) 126/53  Pulse Rate 65  ECG Heart Rate 65  Resp (!) 22  Level of Consciousness Alert  SpO2 93 %  O2 Device Room Air  Assess: MEWS Score  MEWS Temp 2  MEWS Systolic 0  MEWS Pulse 0  MEWS RR 1  MEWS LOC 0  MEWS Score 3  MEWS Score Color Yellow   Post Interventions. Tylenol, ice packs.

## 2019-08-10 NOTE — Progress Notes (Signed)
[facility]                                                                              Patient Demographics  Heather Petty, is a 69 y.o. female, DOB - 1951/01/16, LKG:[id number]  Admit date - 08/05/2019   Admitting Physician Shela Leff, MD  Outpatient Primary MD for the patient is [name], MD  Outpatient specialists:   LOS - 5  days   Medical records reviewed and are as summarized below:    Chief Complaint  Patient presents with  . Abnormal Lab  . Abdominal Pain       Brief summary   Heather Petty is a 69 y.o. female with history of asthma, depression, insulin-dependent type 2 diabetes, gout, hypertension, hyperlipidemia, morbid obesity (BMI 45.82), schizophrenia, CVA presenting from [facility] for evaluation of low hemoglobin (5.6), diarrhea, abdominal pain, and shortness of breath.  Patient was recently admitted to the hospital 6/14-6/24 for sepsis secondary to MRSA and enterococcal bacteremia.  Source felt to be secondary to UTI and infected sacral decubitus ulcer.  Urine culture grew Klebsiella.  No valvular vegetations on TTE.  Infectious disease was consulted and recommended TEE which the patient refused.  PICC line was placed on 6/22 and she was discharged with plan to continue IV vancomycin for total of 6 weeks until 7/26. Patient reported that she was sent here from [facility] because her hemoglobin was low.  She has had nonbloody diarrhea for the past 2 days.  No nausea or vomiting.  She had some mild lower abdominal discomfort earlier which has now resolved.  Denies chest pain or shortness of breath.  No other complaints   Assessment & Plan    Principal Problem:   Diarrhea -Presented with diarrhea however abdominal exam benign, no fever or leukocytosis.  She has been on vancomycin till 7/26 after being recently diagnosed with MRSA and enterococcal bacteremia.  Patient reported no GI bleeding - Resolved  Active  problems Elevated LFTs -Unclear etiology, trending up, alk phos 479, AST is 111, ALT 115, total bilirubin 5.9 -Abdominal ultrasound 6/29 showed no acute abnormality, hepatic steatosis -Patient had CT chest abdomen pelvis on 6/14 which had shown no definite acute intrathoracic, abdominal or pelvic pathology, no acute or significant osseous findings.  -MRCP showed hepatic steatosis, trace volume of perihepatic ascites, small right and trace left pleural effusions, mild splenomegaly -Hepatitis panel, HIV negative.  AMA negative.  Follow anti-SMA, ANA, ceruloplasmin, AAT, TTG.  -Hold statin, doxepin -LFTs improving, however total bili trending up, 7.2, ordered fractionated bilirubin -GI will follow today, will await recommendations  Acute on chronic normocytic anemia -Hemoglobin 6.7 at the time of admission was 8.4 at the time of recent hospital discharge, baseline around 10.  -FOBT negative, received a total of 2 units packed RBC transfusion -Anemia panel consistent with anemia of chronic disease, may have bone marrow depression due to ongoing antibiotics and chronic illness -Hemoglobin stable 9.5 (9.0 on 7/3)  Recent MRSA and enterococcal bacteremia -TTE did not reveal any valvular vegetations, ID had recommended TEE which patient had refused during previous admission -Blood cultures to 6/18 showed no growth,  has a right upper extremity PICC line -Continue IV vancomycin for total of 6 weeks until 7/26   Sinus bradycardia, hypertension -Continue nadolol.  Heart rate controlled  Acute kidney injury -Baseline creatinine 0.6-0.7, likely prerenal due to dehydration, in the setting of diarrhea and anemia.  On ACE inhibitor, vancomycin -Patient was on colchicine and benazepril, which has been placed on hold  -Cr improved 1.2   Poorly controlled type 2 diabetes mellitus, IDDM with hyperglycemia -Hemoglobin A1c 10.4 on 6/15 -BG stable, continue Lantus 10 units daily, sliding scale insulin,  continue to hold glipizide   Hyperlipidemia Holding statin due to transaminitis  Dyspnea -Per admission note, shortness of breath was reported however chest x-ray showed no active disease, possibly due to anemia -Resolved, currently on room air    Pressure Injury Documentation: -Sacral decub stage I, wound care consulted, skin intact, except for pinpoint area on the right buttock   Obesity Estimated body mass index is 45.82 kg/m as calculated from the following:   Height as of this encounter: 5' 1"  (1.549 m).   Weight as of this encounter: 110 kg.  Code Status: Full CODE STATUS DVT Prophylaxis:  Lovenox  Family Communication: Discussed all imaging results, lab results, explained to the patient. Called patient's daughter, [name] on the phone today    Disposition Plan:     Status is: Inpatient  Remains inpatient appropriate because:Inpatient level of care appropriate due to severity of illness   Dispo: The patient is from: SNF              Anticipated d/c is to: SNF              Anticipated d/c date is: 2 days              Patient currently is not medically stable to d/c.  Worsening transaminitis, need further work-up  Time Spent in minutes 25 minutes  Procedures:  MRCP  Consultants:   Gastroenterology  Antimicrobials:   Anti-infectives (From admission, onward)   Start     Dose/Rate Route Frequency Ordered Stop   08/09/19 1000  vancomycin (VANCOREADY) IVPB 750 mg/150 mL     Discontinue     750 mg 150 mL/hr over 60 Minutes Intravenous Every 24 hours 08/09/19 0145     08/07/19 2200  vancomycin (VANCOCIN) IVPB 1000 mg/200 mL premix        1,000 mg 200 mL/hr over 60 Minutes Intravenous  Once 08/07/19 1837 08/07/19 2328   08/07/19 0832  vancomycin variable dose per unstable renal function (pharmacist dosing)  Status:  Discontinued         Does not apply See admin instructions 08/07/19 0832 08/09/19 0145   08/06/19 1330  vancomycin (VANCOREADY) IVPB 1250  mg/250 mL        1,250 mg 166.7 mL/hr over 90 Minutes Intravenous  Once 08/06/19 1231 08/06/19 1753         Medications  Scheduled Meds: . sodium chloride   Intravenous Once  . allopurinol  300 mg Oral Daily  . amLODipine  10 mg Oral Daily  . vitamin C  500 mg Oral BID  . B-complex with vitamin C  1 tablet Oral Daily  . Chlorhexidine Gluconate Cloth  6 each Topical Daily  . enoxaparin (LOVENOX) injection  40 mg Subcutaneous Q24H  . folic acid  1 mg Oral Daily  . insulin aspart  0-5 Units Subcutaneous QHS  . insulin aspart  0-9 Units Subcutaneous TID WC  . insulin  glargine  10 Units Subcutaneous Daily  . nadolol  20 mg Oral Daily  . pantoprazole  40 mg Oral Q0600   Continuous Infusions: . vancomycin 750 mg (08/10/19 0957)   PRN Meds:.acetaminophen **OR** acetaminophen, azelastine **AND** fluticasone, bisacodyl, diphenhydrAMINE-zinc acetate, gadobutrol, polyvinyl alcohol, promethazine      Subjective:   Heather Petty was seen and examined today. Per patients, no acute complaints. No acute abdominal pain, fevers, or diarrhea. No nausea or vomiting.    Objective:   Vitals:   08/10/19 0433 08/10/19 0500 08/10/19 0600 08/10/19 0813  BP:    (!) 143/63  Pulse: 65 (!) 58 (!) 56 (!) 59  Resp: 19 (!) 21 (!) 21 (!) 25  Temp:    98.8 F (37.1 C)  TempSrc:    Oral  SpO2: 98% 98% 98% 98%  Weight:      Height:        Intake/Output Summary (Last 24 hours) at 08/10/2019 1206 Last data filed at 08/10/2019 0805 Gross per 24 hour  Intake 300 ml  Output 1750 ml  Net -1450 ml     Wt Readings from Last 3 Encounters:  08/05/19 110 kg  07/21/19 110 kg  07/21/18 106.3 kg    Physical Exam  General: Alert and oriented x 3, NAD  Cardiovascular: S1 S2 clear, RRR. No pedal edema b/l  Respiratory: CTAB, no wheezing, rales or rhonchi  Gastrointestinal: Soft, nontender, nondistended, NBS  Ext: no pedal edema bilaterally, RUE PICC  Neuro: no new  deficits  Musculoskeletal: No cyanosis, clubbing  Skin: No rashes  Psych: Normal affect and demeanor, alert and oriented x3    Data Reviewed:  I have personally reviewed following labs and imaging studies  Micro Results Recent Results (from the past 240 hour(s))  SARS Coronavirus 2 by RT PCR (hospital order, performed in [place] hospital lab) Nasopharyngeal Nasopharyngeal Swab     Status: None   Collection Time: 08/05/19  7:50 PM   Specimen: Nasopharyngeal Swab  Result Value Ref Range Status   SARS Coronavirus 2 NEGATIVE NEGATIVE Final    Comment: (NOTE) SARS-CoV-2 target nucleic acids are NOT DETECTED.  The SARS-CoV-2 RNA is generally detectable in upper and lower respiratory specimens during the acute phase of infection. The lowest concentration of SARS-CoV-2 viral copies this assay can detect is 250 copies / mL. A negative result does not preclude SARS-CoV-2 infection and should not be used as the sole basis for treatment or other patient management decisions.  A negative result may occur with improper specimen collection / handling, submission of specimen other than nasopharyngeal swab, presence of viral mutation(s) within the areas targeted by this assay, and inadequate number of viral copies (<250 copies / mL). A negative result must be combined with clinical observations, patient history, and epidemiological information.  Fact Sheet for Patients:   StrictlyIdeas.no  Fact Sheet for Healthcare Providers: BankingDealers.co.za  This test is not yet approved or  cleared by the [place] FDA and has been authorized for detection and/or diagnosis of SARS-CoV-2 by FDA under an Emergency Use Authorization (EUA).  This EUA will remain in effect (meaning this test can be used) for the duration of the COVID-19 declaration under Section 564(b)(1) of the Act, 21 U.S.C. section 360bbb-3(b)(1), unless the authorization is  terminated or revoked sooner.  Performed at [facility], [place] [address], [place], [place] [address]     Radiology Reports CT ABDOMEN PELVIS WO CONTRAST  Addendum Date: 07/22/2019   ADDENDUM REPORT:  07/22/2019 00:43 ADDENDUM: Along the posterior left subcutaneous soft tissues overlying the iliac there is a focal area of ulceration and fat stranding changes. No loculated fluid collections or subcutaneous emphysema. No cortical destruction or bony erosion however seen at the posterior iliac. Electronically Signed   By: Prudencio Pair M.D.   On: 07/22/2019 00:43   Result Date: 07/22/2019 CLINICAL DATA:  Fever of unknown origin EXAM: CT CHEST, abdomen, and pelvis WITHOUT CONTRAST TECHNIQUE: Multidetector CT imaging of the chest, abdomen and pelvis was performed following the standard protocol without IV contrast. COMPARISON:  July 19, 2018 FINDINGS: Cardiovascular: Scattered aortic atherosclerosis is noted. Coronary artery calcifications are seen. The heart size is normal. There is no pericardial thickening or effusion. Mediastinum/Nodes: There are no enlarged mediastinal, hilar or axillary lymph nodes. The thyroid gland, trachea and esophagus demonstrate no significant findings. Lungs/Pleura: Minimal streaky atelectasis seen at both lung bases. Small amount of tree-in-bud opacity seen at the posterior periphery of the right lung base as on the prior exam. There is a small 5 mm pulmonary nodule at the right lung base which was partially visualized on prior exam. No pleural effusion Upper abdomen: The visualized portion of the upper abdomen is unremarkable. Musculoskeletal/Chest wall: There is no chest wall mass or suspicious osseous finding. 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 Abdomen/pelvis: Hepatobiliary: There is diffuse low density seen throughout the liver parenchyma. No focal hepatic lesion  although limited due to the lack of intravenous contrast. Layering hyperdense sludge/small stones are noted. Ill intrahepatic biliary ductal dilatation. Pancreas: Unremarkable.  No surrounding inflammatory changes. Spleen: Normal in size. Although limited due to the lack of intravenous contrast, normal in appearance. Adrenals/Urinary Tract: Both adrenal glands appear normal. The kidneys and collecting system appear normal without evidence of urinary tract calculus or hydronephrosis. Bladder is unremarkable. Stomach/Bowel: The stomach, small bowel, and colon are normal in appearance. No inflammatory changes or obstructive findings. Diverticulosis without diverticulitis is seen. The appendix is unremarkable. Vascular/Lymphatic: There are no enlarged abdominal or pelvic lymph nodes. Scattered aortic atherosclerotic calcifications are seen without aneurysmal dilatation. Reproductive: The uterus and adnexa are unremarkable. Other: No evidence of abdominal wall mass or hernia. Musculoskeletal: No acute or significant osseous findings. IMPRESSION: 1. No definite acute intrathoracic, abdominal, or pelvic pathology to explain the patient's symptoms. 2. Unchanged minimal tree-in-bud opacities at the posterior periphery of the right lung base, likely due to chronic inflammatory process. 3.  Cholelithiasis without evidence of acute cholecystitis. 4. 5 mm pulmonary nodule at the right lung base. No follow-up recommended. This recommendation follows the consensus statement: Guidelines for Management of Incidental Pulmonary Nodules Detected on CT Images: From the Fleischner Society 2017; Radiology 2017; 284:228-243. 5.  Aortic Atherosclerosis (ICD10-I70.0). Electronically Signed: By: Prudencio Pair M.D. On: 07/22/2019 00:12   DG Chest 2 View  Result Date: 07/21/2019 CLINICAL DATA:  Suspected sepsis. EXAM: CHEST - 2 VIEW COMPARISON:  Radiograph 07/19/2018 FINDINGS: Low lung volumes. Mild cardiomegaly. Aortic atherosclerosis.  Peribronchial thickening. There are streaky bibasilar opacities. No confluent airspace disease. No significant pleural effusion. No pneumothorax. No acute osseous abnormalities are seen. IMPRESSION: 1. Low lung volumes with streaky bibasilar opacities, favoring atelectasis. 2. Peribronchial thickening. 3. Mild cardiomegaly. Electronically Signed   By: Keith Rake M.D.   On: 07/21/2019 22:46   DG Shoulder Right  Result Date: 07/24/2019 CLINICAL DATA:  Unwitnessed fall EXAM: RIGHT SHOULDER - 2+ VIEW COMPARISON:  None.  FINDINGS: Degenerative changes in the right [facility] and glenohumeral joints with joint space narrowing and spurring. No acute bony abnormality. Specifically, no fracture, subluxation, or dislocation. IMPRESSION: Degenerative changes.  No acute bony abnormality. Electronically Signed   By: Rolm Baptise M.D.   On: 07/24/2019 20:55   CT Head Wo Contrast  Result Date: 07/21/2019 CLINICAL DATA:  Altered mental status. EXAM: CT HEAD WITHOUT CONTRAST TECHNIQUE: Contiguous axial images were obtained from the base of the skull through the vertex without intravenous contrast. COMPARISON:  Head CT 05/07/2018 FINDINGS: Brain: No intracranial hemorrhage, mass effect, or midline shift. Normal brain volume for age. No hydrocephalus. The basilar cisterns are patent. Mild chronic small vessel ischemia. No evidence of territorial infarct or acute ischemia. No extra-axial or intracranial fluid collection. Vascular: Atherosclerosis of skullbase vasculature without hyperdense vessel or abnormal calcification. Skull: No fracture or focal lesion. Sinuses/Orbits: Paranasal sinuses and mastoid air cells are clear. The visualized orbits are unremarkable. Other: None. IMPRESSION: 1. No acute intracranial abnormality. 2. Mild chronic small vessel ischemia. Electronically Signed   By: Keith Rake M.D.   On: 07/21/2019 22:59   CT Chest Wo Contrast  Addendum Date: 07/22/2019   ADDENDUM REPORT: 07/22/2019 00:43  ADDENDUM: Along the posterior left subcutaneous soft tissues overlying the iliac there is a focal area of ulceration and fat stranding changes. No loculated fluid collections or subcutaneous emphysema. No cortical destruction or bony erosion however seen at the posterior iliac. Electronically Signed   By: Prudencio Pair M.D.   On: 07/22/2019 00:43   Result Date: 07/22/2019 CLINICAL DATA:  Fever of unknown origin EXAM: CT CHEST, abdomen, and pelvis WITHOUT CONTRAST TECHNIQUE: Multidetector CT imaging of the chest, abdomen and pelvis was performed following the standard protocol without IV contrast. COMPARISON:  July 19, 2018 FINDINGS: Cardiovascular: Scattered aortic atherosclerosis is noted. Coronary artery calcifications are seen. The heart size is normal. There is no pericardial thickening or effusion. Mediastinum/Nodes: There are no enlarged mediastinal, hilar or axillary lymph nodes. The thyroid gland, trachea and esophagus demonstrate no significant findings. Lungs/Pleura: Minimal streaky atelectasis seen at both lung bases. Small amount of tree-in-bud opacity seen at the posterior periphery of the right lung base as on the prior exam. There is a small 5 mm pulmonary nodule at the right lung base which was partially visualized on prior exam. No pleural effusion Upper abdomen: The visualized portion of the upper abdomen is unremarkable. Musculoskeletal/Chest wall: There is no chest wall mass or suspicious osseous finding. 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 Abdomen/pelvis: Hepatobiliary: There is diffuse low density seen throughout the liver parenchyma. No focal hepatic lesion although limited due to the lack of intravenous contrast. Layering hyperdense sludge/small stones are noted. Ill intrahepatic biliary ductal dilatation. Pancreas: Unremarkable.  No surrounding inflammatory changes. Spleen: Normal in size.  Although limited due to the lack of intravenous contrast, normal in appearance. Adrenals/Urinary Tract: Both adrenal glands appear normal. The kidneys and collecting system appear normal without evidence of urinary tract calculus or hydronephrosis. Bladder is unremarkable. Stomach/Bowel: The stomach, small bowel, and colon are normal in appearance. No inflammatory changes or obstructive findings. Diverticulosis without diverticulitis is seen. The appendix is unremarkable. Vascular/Lymphatic: There are no enlarged abdominal or pelvic lymph nodes. Scattered aortic atherosclerotic calcifications are seen without aneurysmal dilatation. Reproductive: The uterus and adnexa are unremarkable. Other: No evidence of abdominal wall mass or hernia. Musculoskeletal: No acute or significant osseous  findings. IMPRESSION: 1. No definite acute intrathoracic, abdominal, or pelvic pathology to explain the patient's symptoms. 2. Unchanged minimal tree-in-bud opacities at the posterior periphery of the right lung base, likely due to chronic inflammatory process. 3.  Cholelithiasis without evidence of acute cholecystitis. 4. 5 mm pulmonary nodule at the right lung base. No follow-up recommended. This recommendation follows the consensus statement: Guidelines for Management of Incidental Pulmonary Nodules Detected on CT Images: From the Fleischner Society 2017; Radiology 2017; 284:228-243. 5.  Aortic Atherosclerosis (ICD10-I70.0). Electronically Signed: By: Prudencio Pair M.D. On: 07/22/2019 00:12   MR ABDOMEN MRCP WO CONTRAST  Result Date: 08/09/2019 CLINICAL DATA:  69 year old female with history of steatohepatitis. EXAM: MRI ABDOMEN WITHOUT CONTRAST  (INCLUDING MRCP) TECHNIQUE: Multiplanar multisequence MR imaging of the abdomen was performed. Heavily T2-weighted images of the biliary and pancreatic ducts were obtained, and three-dimensional MRCP images were rendered by post processing. COMPARISON:  No priors. FINDINGS: Comment:  Today's study is limited for detection and characterization of visceral and/or vascular lesions by lack of IV gadolinium. Lower chest: Small right and trace left pleural effusions lying dependently. Hepatobiliary: Mild diffuse loss of signal intensity throughout the hepatic parenchyma on out of phase dual echo images, indicative of hepatic steatosis. No suspicious hepatic lesions confidently identified on today's noncontrast examination. No intra or extrahepatic biliary ductal dilatation. No filling defects in the common bile duct to suggest choledocholithiasis. Common bile duct measures 6 mm in the porta hepatis on MRCP images. Tiny filling defects lying dependently in the gallbladder which may reflect a small amount of biliary sludge and/or tiny gallstones. Gallbladder is only moderately distended. No gallbladder wall thickening or pericholecystic fluid. Pancreas: No definite pancreatic mass or peripancreatic fluid collections or inflammatory changes noted on today's noncontrast examination. No pancreatic ductal dilatation noted on MRCP images. Spleen: Spleen is enlarged measuring 10.6 x 6.8 x 11.9 cm (estimated splenic volume of 429 mL). Adrenals/Urinary Tract: Unenhanced appearance of the kidneys and bilateral adrenal glands is unremarkable. No hydroureteronephrosis in the visualized portions of the abdomen. Stomach/Bowel: Visualized portions are unremarkable. Vascular/Lymphatic: No aneurysm identified in the visualized abdominal vasculature. No lymphadenopathy noted in the abdomen. Other:  Trace volume of perihepatic ascites. Musculoskeletal: No aggressive appearing osseous lesions are noted in the visualized portions of the skeleton. IMPRESSION: 1. Hepatic steatosis. 2. Trace volume of perihepatic ascites. 3. Mild splenomegaly. 4. Small right and trace left pleural effusions lying dependently. Electronically Signed   By: Vinnie Langton M.D.   On: 08/09/2019 08:06   DG Chest Port 1 View  Result Date:  08/05/2019 CLINICAL DATA:  Shortness of breath EXAM: PORTABLE CHEST 1 VIEW COMPARISON:  07/23/2019 FINDINGS: No focal opacity or pleural effusion. Stable cardiomediastinal silhouette with aortic atherosclerosis. No pneumothorax. Right upper extremity central venous catheter with tip over the SVC. IMPRESSION: No active disease. Electronically Signed   By: Donavan Foil M.D.   On: 08/05/2019 20:28   DG CHEST PORT 1 VIEW  Result Date: 07/23/2019 CLINICAL DATA:  Tachypnea EXAM: PORTABLE CHEST 1 VIEW COMPARISON:  07/21/2019 FINDINGS: The heart size and mediastinal contours are within normal limits. Aortic atherosclerosis. Both lungs are clear. The visualized skeletal structures are unremarkable. IMPRESSION: No active disease. Electronically Signed   By: Donavan Foil M.D.   On: 07/23/2019 19:39   DG Humerus Right  Result Date: 07/24/2019 CLINICAL DATA:  Unwitnessed fall EXAM: RIGHT HUMERUS - 2+ VIEW COMPARISON:  Shoulder series today FINDINGS: Degenerative changes in the right AC and glenohumeral joints. No acute  bony abnormality. Specifically, no fracture, subluxation, or dislocation. IMPRESSION: No acute bony abnormality. Electronically Signed   By: Rolm Baptise M.D.   On: 07/24/2019 20:56   ECHOCARDIOGRAM COMPLETE  Result Date: 07/23/2019    ECHOCARDIOGRAM REPORT   Patient Name:   KADEY MIHALIC Date of Exam: 07/23/2019 Medical Rec #:  [id number]        Height:       61.0 in Accession #:    [id number]       Weight:       242.5 lb Date of Birth:  [birth year]        BSA:          2.050 m Patient Age:    43 years         BP:           135/56 mmHg Patient Gender: F                HR:           84 bpm. Exam Location:  Inpatient Procedure: 2D Echo, Cardiac Doppler and Color Doppler Indications:    Bacteremia  History:        Patient has prior history of Echocardiogram examinations, most                 recent 07/20/2018. Risk Factors:Diabetes, Hypertension,                 Dyslipidemia and Former Smoker. MRSA  bacteremia.  Sonographer:    Clayton Lefort RDCS (AE) Referring Phys: [place]  1. Left ventricular ejection fraction, by estimation, is 65 to 70%. The left ventricle has normal function. The left ventricle has no regional wall motion abnormalities. There is mild concentric left ventricular hypertrophy. Left ventricular diastolic parameters are consistent with Grade I diastolic dysfunction (impaired relaxation).  2. Right ventricular systolic function is normal. The right ventricular size is normal. There is normal pulmonary artery systolic pressure. The estimated right ventricular systolic pressure is 63.8 mmHg.  3. The mitral valve is normal in structure. No evidence of mitral valve regurgitation. No evidence of mitral stenosis.  4. The aortic valve has an indeterminant number of cusps. Aortic valve regurgitation is not visualized. Mild to moderate aortic valve sclerosis/calcification is present, without any evidence of aortic stenosis.  5. The inferior vena cava is normal in size with greater than 50% respiratory variability, suggesting right atrial pressure of 3 mmHg. Conclusion(s)/Recommendation(s): No evidence of valvular vegetations on this transthoracic echocardiogram. Would recommend a transesophageal echocardiogram to exclude infective endocarditis if clinically indicated. FINDINGS  Left Ventricle: Left ventricular ejection fraction, by estimation, is 65 to 70%. The left ventricle has normal function. The left ventricle has no regional wall motion abnormalities. The left ventricular internal cavity size was normal in size. There is  mild concentric left ventricular hypertrophy. Left ventricular diastolic parameters are consistent with Grade I diastolic dysfunction (impaired relaxation). Normal left ventricular filling pressure. Right Ventricle: The right ventricular size is normal. No increase in right ventricular wall thickness. Right ventricular systolic function is normal. There is  normal pulmonary artery systolic pressure. The tricuspid regurgitant velocity is 2.48 m/s, and  with an assumed right atrial pressure of 3 mmHg, the estimated right ventricular systolic pressure is 46.6 mmHg. Left Atrium: Left atrial size was normal in size. Right Atrium: Right atrial size was normal in size. Pericardium: There is no evidence of pericardial effusion. Mitral Valve: The mitral valve is normal in structure. Normal  mobility of the mitral valve leaflets. Mild mitral annular calcification. No evidence of mitral valve regurgitation. No evidence of mitral valve stenosis. MV peak gradient, 3.5 mmHg. The mean mitral valve gradient is 2.0 mmHg. Tricuspid Valve: The tricuspid valve is normal in structure. Tricuspid valve regurgitation is trivial. No evidence of tricuspid stenosis. Aortic Valve: The aortic valve has an indeterminant number of cusps. Aortic valve regurgitation is not visualized. Mild to moderate aortic valve sclerosis/calcification is present, without any evidence of aortic stenosis. Aortic valve mean gradient measures 5.0 mmHg. Aortic valve peak gradient measures 12.7 mmHg. Aortic valve area, by VTI measures 2.20 cm. Pulmonic Valve: The pulmonic valve was normal in structure. Pulmonic valve regurgitation is not visualized. No evidence of pulmonic stenosis. Aorta: The aortic root is normal in size and structure. Venous: The inferior vena cava is normal in size with greater than 50% respiratory variability, suggesting right atrial pressure of 3 mmHg. IAS/Shunts: The interatrial septum appears to be lipomatous. No atrial level shunt detected by color flow Doppler.  LEFT VENTRICLE PLAX 2D LVIDd:         3.50 cm  Diastology LVIDs:         2.20 cm  LV e' lateral:   8.38 cm/s LV PW:         1.20 cm  LV E/e' lateral: 9.9 LV IVS:        1.10 cm  LV e' medial:    7.94 cm/s LVOT diam:     1.70 cm  LV E/e' medial:  10.4 LV SV:         64 LV SV Index:   31 LVOT Area:     2.27 cm  RIGHT VENTRICLE              IVC RV Basal diam:  3.00 cm     IVC diam: 1.50 cm RV S prime:     11.00 cm/s TAPSE (M-mode): 1.6 cm LEFT ATRIUM             Index       RIGHT ATRIUM           Index LA diam:        2.40 cm 1.17 cm/m  RA Area:     11.10 cm LA Vol (A2C):   54.7 ml 26.68 ml/m RA Volume:   22.60 ml  11.02 ml/m LA Vol (A4C):   36.9 ml 18.00 ml/m LA Biplane Vol: 45.4 ml 22.15 ml/m  AORTIC VALVE AV Area (Vmax):    2.10 cm AV Area (Vmean):   2.23 cm AV Area (VTI):     2.20 cm AV Vmax:           178.00 cm/s AV Vmean:          109.000 cm/s AV VTI:            0.293 m AV Peak Grad:      12.7 mmHg AV Mean Grad:      5.0 mmHg LVOT Vmax:         165.00 cm/s LVOT Vmean:        107.000 cm/s LVOT VTI:          0.284 m LVOT/AV VTI ratio: 0.97  AORTA Ao Root diam: 2.60 cm Ao Asc diam:  3.20 cm MITRAL VALVE                TRICUSPID VALVE MV Area (PHT): 2.69 cm     TR Peak grad:   24.6 mmHg  MV Peak grad:  3.5 mmHg     TR Vmax:        248.00 cm/s MV Mean grad:  2.0 mmHg MV Vmax:       0.94 m/s     SHUNTS MV Vmean:      58.4 cm/s    Systemic VTI:  0.28 m MV Decel Time: 282 msec     Systemic Diam: 1.70 cm MV E velocity: 82.60 cm/s MV A velocity: 102.00 cm/s MV E/A ratio:  0.81 Fransico Him MD Electronically signed by Fransico Him MD Signature Date/Time: 07/23/2019/4:37:10 PM    Final    [place] EKG SITE RITE  Result Date: 07/28/2019 If Site Rite image not attached, placement could not be confirmed due to current cardiac rhythm.  US Abdomen Limited RUQ  Result Date: 08/05/2019 CLINICAL DATA:  Elevated LFTs EXAM: ULTRASOUND ABDOMEN LIMITED RIGHT UPPER QUADRANT COMPARISON:  CT dated July 21, 2019 FINDINGS: Gallbladder: No gallstones or wall thickening visualized. No sonographic Murphy sign noted by sonographer. Common bile duct: Diameter: 6 mm Liver: Diffuse increased echogenicity with slightly heterogeneous liver. Appearance typically secondary to fatty infiltration. Fibrosis secondary consideration. No secondary findings of cirrhosis noted. No  focal hepatic lesion or intrahepatic biliary duct dilatation. Portal vein is patent on color Doppler imaging with normal direction of blood flow towards the liver. Other: None. IMPRESSION: 1. No acute abnormality. 2. Hepatic steatosis. Electronically Signed   By: Constance Holster M.D.   On: 08/05/2019 22:30    Lab Data:  CBC: Recent Labs  Lab 08/05/19 1329 08/06/19 0054 08/07/19 0007 08/07/19 0316 08/08/19 0450 08/09/19 0345 08/10/19 0722  WBC 5.7  --   --  6.4 4.8 4.8 4.4  HGB 6.7*   < > 8.4* 8.9* 8.6* 9.0* 9.5*  HCT 22.5*   < > 27.0* 27.8* 26.7* 29.0* 30.1*  MCV 98.3  --   --  93.9 94.7 94.8 95.9  PLT 229  --   --  211 196 193 180   < > = values in this interval not displayed.   Basic Metabolic Panel: Recent Labs  Lab 08/06/19 0054 08/06/19 0054 08/07/19 0316 08/07/19 1653 08/08/19 0450 08/08/19 1000 08/08/19 2200 08/09/19 0345 08/10/19 0425  NA 140  --  140  --  140  --   --  141 137  K 4.0  --  4.2  --  3.8  --   --  3.7 3.6  CL 113*  --  113*  --  112*  --   --  110 105  CO2 21*  --  21*  --  21*  --   --  23 24  GLUCOSE 225*  --  88  --  88  --   --  63* 101*  BUN 23  --  22  --  15  --   --  13 14  CREATININE 1.58*   < > 1.42*   < > 1.22* 1.27* 1.28* 1.27* 1.26*  CALCIUM 8.4*  --  8.3*  --  8.6*  --   --  8.7* 8.3*   < > = values in this interval not displayed.   GFR: Estimated Creatinine Clearance: 47.7 mL/min (A) (by C-G formula based on SCr of 1.26 mg/dL (H)). Liver Function Tests: Recent Labs  Lab 08/06/19 0054 08/07/19 0316 08/08/19 0450 08/09/19 0345 08/10/19 0425  AST 24 18 63* 111* 95*  ALT 66* 45* 58* 115* 130*  ALKPHOS 370* 272* 448* 479* 420*  BILITOT  2.1* 1.9* 3.9* 5.9* 7.2*  PROT 5.7* 5.7* 5.3* 5.7* 5.6*  ALBUMIN 2.5* 2.5* 2.3* 2.5* 2.5*   Recent Labs  Lab 08/05/19 1329  LIPASE 42   No results for input(s): AMMONIA in the last 168 hours. Coagulation Profile: Recent Labs  Lab 08/08/19 1042  INR 1.2   Cardiac Enzymes: No  results for input(s): CKTOTAL, CKMB, CKMBINDEX, TROPONINI in the last 168 hours. BNP (last 3 results) No results for input(s): PROBNP in the last 8760 hours. HbA1C: No results for input(s): HGBA1C in the last 72 hours. CBG: Recent Labs  Lab 08/09/19 1132 08/09/19 1506 08/09/19 2121 08/10/19 0610 08/10/19 1108  GLUCAP 113* 100* 160* 91 111*   Lipid Profile: No results for input(s): CHOL, HDL, LDLCALC, TRIG, CHOLHDL, LDLDIRECT in the last 72 hours. Thyroid Function Tests: No results for input(s): TSH, T4TOTAL, FREET4, T3FREE, THYROIDAB in the last 72 hours. Anemia Panel: No results for input(s): VITAMINB12, FOLATE, FERRITIN, TIBC, IRON, RETICCTPCT in the last 72 hours. Urine analysis:    Component Value Date/Time   COLORURINE AMBER (A) 07/28/2019 1236   APPEARANCEUR CLEAR 07/28/2019 1236   LABSPEC 1.014 07/28/2019 1236   PHURINE 7.0 07/28/2019 1236   GLUCOSEU NEGATIVE 07/28/2019 1236   HGBUR NEGATIVE 07/28/2019 1236   BILIRUBINUR NEGATIVE 07/28/2019 1236   KETONESUR NEGATIVE 07/28/2019 1236   PROTEINUR 30 (A) 07/28/2019 1236   UROBILINOGEN 0.2 12/27/2010 1858   NITRITE NEGATIVE 07/28/2019 1236   LEUKOCYTESUR NEGATIVE 07/28/2019 1236     Aayush Gelpi M.D. [facility] 08/10/2019, 12:06 PM   Call night coverage person covering after 7pm

## 2019-08-10 NOTE — Progress Notes (Signed)
Post intervention, MEWS guidelines assessment.

## 2019-08-10 NOTE — Progress Notes (Signed)
Reassessment following Interventions and MEWS protocol.

## 2019-08-10 NOTE — Progress Notes (Signed)
Rapid response came to bedside to assess patient. Saralyn Pilar, RN and [place], RN provided recommendations for lactic acid lab draw and ice packs to axilla and groin.  Called Dr. Tana Coast. MD for lactic acid order. Continue to follow MEWS protocol.

## 2019-08-11 DIAGNOSIS — L80 Vitiligo: Secondary | ICD-10-CM

## 2019-08-11 DIAGNOSIS — N179 Acute kidney failure, unspecified: Secondary | ICD-10-CM

## 2019-08-11 DIAGNOSIS — K219 Gastro-esophageal reflux disease without esophagitis: Secondary | ICD-10-CM

## 2019-08-11 DIAGNOSIS — S31000D Unspecified open wound of lower back and pelvis without penetration into retroperitoneum, subsequent encounter: Secondary | ICD-10-CM

## 2019-08-11 DIAGNOSIS — L98429 Non-pressure chronic ulcer of back with unspecified severity: Secondary | ICD-10-CM

## 2019-08-11 LAB — COMPREHENSIVE METABOLIC PANEL
ALT: 243 U/L — ABNORMAL HIGH (ref 0–44)
AST: 205 U/L — ABNORMAL HIGH (ref 15–41)
Albumin: 2.3 g/dL — ABNORMAL LOW (ref 3.5–5.0)
Alkaline Phosphatase: 363 U/L — ABNORMAL HIGH (ref 38–126)
Anion gap: 11 (ref 5–15)
BUN: 18 mg/dL (ref 8–23)
CO2: 20 mmol/L — ABNORMAL LOW (ref 22–32)
Calcium: 7.9 mg/dL — ABNORMAL LOW (ref 8.9–10.3)
Chloride: 101 mmol/L (ref 98–111)
Creatinine, Ser: 1.54 mg/dL — ABNORMAL HIGH (ref 0.44–1.00)
GFR calc Af Amer: 39 mL/min — ABNORMAL LOW (ref >60)
GFR calc non Af Amer: 34 mL/min — ABNORMAL LOW (ref >60)
Glucose, Bld: 205 mg/dL — ABNORMAL HIGH (ref 70–99)
Potassium: 3.3 mmol/L — ABNORMAL LOW (ref 3.5–5.1)
Sodium: 132 mmol/L — ABNORMAL LOW (ref 135–145)
Total Bilirubin: 7.1 mg/dL — ABNORMAL HIGH (ref 0.3–1.2)
Total Protein: 5.4 g/dL — ABNORMAL LOW (ref 6.5–8.1)

## 2019-08-11 LAB — CBC
HCT: 30.4 % — ABNORMAL LOW (ref 36.0–46.0)
Hemoglobin: 9.7 g/dL — ABNORMAL LOW (ref 12.0–15.0)
MCH: 29.9 pg (ref 26.0–34.0)
MCHC: 31.9 g/dL (ref 30.0–36.0)
MCV: 93.8 fL (ref 80.0–100.0)
Platelets: 195 10*3/uL (ref 150–400)
RBC: 3.24 MIL/uL — ABNORMAL LOW (ref 3.87–5.11)
RDW: 18.7 % — ABNORMAL HIGH (ref 11.5–15.5)
WBC: 4.6 10*3/uL (ref 4.0–10.5)
nRBC: 0 % (ref 0.0–0.2)

## 2019-08-11 LAB — GLUCOSE, CAPILLARY
Glucose-Capillary: 207 mg/dL — ABNORMAL HIGH (ref 70–99)
Glucose-Capillary: 118 mg/dL — ABNORMAL HIGH (ref 70–99)
Glucose-Capillary: 195 mg/dL — ABNORMAL HIGH (ref 70–99)
Glucose-Capillary: 197 mg/dL — ABNORMAL HIGH (ref 70–99)

## 2019-08-11 LAB — PROCALCITONIN: Procalcitonin: 6.58 ng/mL

## 2019-08-11 MED ORDER — POTASSIUM CHLORIDE 20 MEQ PO PACK
40.0000 meq | PACK | Freq: Once | ORAL | Status: AC
  Administered 2019-08-11: 40 meq via ORAL
  Filled 2019-08-11: qty 2

## 2019-08-11 MED ORDER — ENOXAPARIN SODIUM 60 MG/0.6ML ~~LOC~~ SOLN
50.0000 mg | SUBCUTANEOUS | Status: DC
  Administered 2019-08-11 – 2019-08-12 (×2): 50 mg via SUBCUTANEOUS
  Filled 2019-08-11 (×2): qty 0.6

## 2019-08-11 MED ORDER — SODIUM CHLORIDE 0.9 % IV SOLN
600.0000 mg | Freq: Every day | INTRAVENOUS | Status: DC
  Administered 2019-08-11 – 2019-08-12 (×2): 600 mg via INTRAVENOUS
  Filled 2019-08-11 (×3): qty 12

## 2019-08-11 MED ORDER — VANCOMYCIN HCL 750 MG/150ML IV SOLN
750.0000 mg | INTRAVENOUS | Status: DC
  Filled 2019-08-11: qty 150

## 2019-08-11 MED ORDER — AMLODIPINE BESYLATE 2.5 MG PO TABS
2.5000 mg | ORAL_TABLET | Freq: Every day | ORAL | Status: DC
  Administered 2019-08-12 – 2019-08-13 (×2): 2.5 mg via ORAL
  Filled 2019-08-11 (×4): qty 1

## 2019-08-11 MED ORDER — DIPHENHYDRAMINE HCL 25 MG PO CAPS
25.0000 mg | ORAL_CAPSULE | Freq: Four times a day (QID) | ORAL | Status: DC | PRN
  Administered 2019-08-12: 25 mg via ORAL
  Filled 2019-08-11: qty 1

## 2019-08-11 MED ORDER — METHYLPREDNISOLONE SODIUM SUCC 40 MG IJ SOLR
40.0000 mg | Freq: Once | INTRAMUSCULAR | Status: AC
  Administered 2019-08-11: 40 mg via INTRAVENOUS
  Filled 2019-08-11: qty 1

## 2019-08-11 NOTE — Progress Notes (Addendum)
PROGRESS NOTE    Heather Petty  RPR:[id number]  DOB: 1950/05/21  PCP: [name], MD Admit date:08/05/2019 Chief compliant: abdominal pain, abnormal lab 69 y.o.femalewith history ofasthma, depression, insulin-dependent type 2 diabetes, gout, hypertension, hyperlipidemia, morbid obesity, schizophrenia, CVA presented from [facility] for evaluation of low hemoglobin (5.6),  abdominal pain,diarrhea and shortness of breath. Patient was recently admitted to the hospital 6/14-6/24 for sepsis secondary to MRSA and enterococcal bacteremia. Source felt to be secondary to UTI and infected sacral decubitus ulcer. Urine culture grew Klebsiella. No valvular vegetationson TTE. Infectious disease was consulted and recommended TEE which the patient refused. PICC line was placed on 6/22 and she was discharged with plan to continue IV vancomycin for total of 6 weeks until 7/26. Patient was sent here from [facility] because hemoglobin was low on lab work. She reported nonbloody diarrhea for 2 days. No nausea or vomiting. She had some mild lower abdominal discomfort earlier which has now resolved.  ED Course: Afebrile,bradycardic with HR in 50s, normotensive although soft BP-systolic 446-286.  Labs - no leukocytosis. Hgb 6.7 (was 8.4 at recent discharge. Previously ranging between 10-13).  No grossly bloody stool seen on rectal exam and FOBT negative.  BUN 22, creatinine 1.5. (Baseline 0.6-0.7).  Alk phos 440, T bili 2.2, ALT 78, AST 36.  Lipase nl. CXR wnl. Random blood glucose 232. Received 1 unit PRBC in the ED. Hospital course: Patient admitted to [facility] for further evaluation and management of anemia.  Stool work-up was negative and diarrhea resolved now.  Hemoglobin has improved and been stable at 9.5 following 2 units of PRBC. GI consulted on 7/2, they had recommended MRCP and sent off hepatic serologies.  Recommended colonoscopy as outpatient. Work-up for elevated LFTs so far unremarkable  including abdominal USG and MRCP other than GB stones versus sludge, hepatic steatosis/concern for early cirrhosis.  Total bilirubin has been trending up to 7.2, predominantly direct.  ALT115 >> 130. INR stable at 1.2 .  Mildly elevated GGT at 190.  Hospital course further complicated by temp spike to 102.82F, lactic acidosis on the evening of 7/4 prompting repeat blood cultures and infectious work-up again with chest x-ray, UA while on antibiotics.  Later in the night on-call team was summoned for evaluation of rash/itching and concern for "red man" syndrome.Antibiotics held until reevaluation this morning.   Subjective:  Patient reports rash along the right upper extremity but itching all over.  She also believes she had rash on her back yesterday.  She states she had itching and developed rash even at the nursing facility prior to admission while on IV vancomycin. Per On call MD Dr Marlowe Sax:  "Overnight 7/5 6:30 AM: Paged by nurse as she was concerned that the patient skin appears erythematous.  She is currently on vancomycin, ceftriaxone, and Flagyl.  I saw the patient, she was resting comfortably and had no complaints other than skin itching.  Still having fevers for which she is currently undergoing work-up but otherwise hemodynamically stable.  She has mild erythema of her chest/neck and upper extremities with a scaling rash.  Unclear how new this rash is.  She has been receiving vancomycin for several weeks, VFS less likely.  She has mild scaling around her mouth but skin rash not exactly consistent with SJS or TEN.  Holding antibiotics for now and Benadryl ordered.  Please reassess in a.m." Objective: Vitals:   08/11/19 0600 08/11/19 0607 08/11/19 0700 08/11/19 0743  BP:  (!) 101/44  (!) 101/41  Pulse:  65 64 60 (!) 57  Resp: (!) 22 (!) 25 (!) 23 (!) 21  Temp:   99 F (37.2 C) 99.2 F (37.3 C)  TempSrc:   Oral Oral  SpO2: 94% 93% 93% 95%  Weight:      Height:        Intake/Output Summary  (Last 24 hours) at 08/11/2019 0753 Last data filed at 08/11/2019 0700 Gross per 24 hour  Intake 1080 ml  Output 950 ml  Net 130 ml   Filed Weights   08/05/19 1310  Weight: 110 kg    Physical Examination: General: Moderately built, no acute distress noted Head ENT: Atraumatic normocephalic, PERRLA but appears icteric, neck supple Heart: S1-S2 heard, regular rate and rhythm, no murmurs.  No leg edema noted Lungs: Equal air entry bilaterally, no rhonchi or rales on exam, no accessory muscle use Abdomen: Bowel sounds heard, soft, nontender, nondistended. No organomegaly.  No CVA tenderness Extremities: No pedal edema.  Drug rash like shown below Neurological: Awake alert oriented x3, no focal weakness or numbness, strength and sensations to crude touch intact Skin: Scaling eczema, vitiligo along her back, digits and left periorbital, lip area.  She now also has a drug rash along right upper extremity as shown below.     Data Reviewed: I have personally reviewed following labs and imaging studies  CBC: Recent Labs  Lab 08/07/19 0316 08/08/19 0450 08/09/19 0345 08/10/19 0722 08/11/19 0520  WBC 6.4 4.8 4.8 4.4 4.6  HGB 8.9* 8.6* 9.0* 9.5* 9.7*  HCT 27.8* 26.7* 29.0* 30.1* 30.4*  MCV 93.9 94.7 94.8 95.9 93.8  PLT 211 196 193 180 381   Basic Metabolic Panel: Recent Labs  Lab 08/07/19 0316 08/07/19 1653 08/08/19 0450 08/08/19 0450 08/08/19 1000 08/08/19 2200 08/09/19 0345 08/10/19 0425 08/11/19 0520  NA 140  --  140  --   --   --  141 137 132*  K 4.2  --  3.8  --   --   --  3.7 3.6 3.3*  CL 113*  --  112*  --   --   --  110 105 101  CO2 21*  --  21*  --   --   --  23 24 20*  GLUCOSE 88  --  88  --   --   --  63* 101* 205*  BUN 22  --  15  --   --   --  _0 CREATININE 1.42*   < > 1.22*   < > 1.27* 1.28* 1.27* 1.26* 1.54*  CALCIUM 8.3*  --  8.6*  --   --   --  8.7* 8.3* 7.9*   < > = values in this interval not displayed.   GFR: Estimated Creatinine Clearance: 39  mL/min (A) (by C-G formula based on SCr of 1.54 mg/dL (H)). Liver Function Tests: Recent Labs  Lab 08/07/19 0316 08/07/19 0316 08/08/19 0450 08/09/19 0345 08/10/19 0425 08/10/19 1220 08/11/19 0520  AST 18  --  63* 111* 95*  --  205*  ALT 45*  --  58* 115* 130*  --  243*  ALKPHOS 272*  --  448* 479* 420*  --  363*  BILITOT 1.9*   < > 3.9* 5.9* 7.2* 7.8* 7.1*  PROT 5.7*  --  5.3* 5.7* 5.6*  --  5.4*  ALBUMIN 2.5*  --  2.3* 2.5* 2.5*  --  2.3*   < > = values in this interval not displayed.  Recent Labs  Lab 08/05/19 1329  LIPASE 42   No results for input(s): AMMONIA in the last 168 hours. Coagulation Profile: Recent Labs  Lab 08/08/19 1042  INR 1.2   Cardiac Enzymes: No results for input(s): CKTOTAL, CKMB, CKMBINDEX, TROPONINI in the last 168 hours. BNP (last 3 results) No results for input(s): PROBNP in the last 8760 hours. HbA1C: No results for input(s): HGBA1C in the last 72 hours. CBG: Recent Labs  Lab 08/10/19 0610 08/10/19 1108 08/10/19 1511 08/10/19 2111 08/11/19 0616  GLUCAP 91 111* 110* 156* 207*   Lipid Profile: No results for input(s): CHOL, HDL, LDLCALC, TRIG, CHOLHDL, LDLDIRECT in the last 72 hours. Thyroid Function Tests: No results for input(s): TSH, T4TOTAL, FREET4, T3FREE, THYROIDAB in the last 72 hours. Anemia Panel: No results for input(s): VITAMINB12, FOLATE, FERRITIN, TIBC, IRON, RETICCTPCT in the last 72 hours. Sepsis Labs: Recent Labs  Lab 08/10/19 0425 08/10/19 1647 08/10/19 2108 08/11/19 0520  PROCALCITON 2.76  --   --  6.58  LATICACIDVEN  --  0.9 0.9  --     Recent Results (from the past 240 hour(s))  SARS Coronavirus 2 by RT PCR (hospital order, performed in [facility] hospital lab) Nasopharyngeal Nasopharyngeal Swab     Status: None   Collection Time: 08/05/19  7:50 PM   Specimen: Nasopharyngeal Swab  Result Value Ref Range Status   SARS Coronavirus 2 NEGATIVE NEGATIVE Final    Comment: (NOTE) SARS-CoV-2 target nucleic  acids are NOT DETECTED.  The SARS-CoV-2 RNA is generally detectable in upper and lower respiratory specimens during the acute phase of infection. The lowest concentration of SARS-CoV-2 viral copies this assay can detect is 250 copies / mL. A negative result does not preclude SARS-CoV-2 infection and should not be used as the sole basis for treatment or other patient management decisions.  A negative result may occur with improper specimen collection / handling, submission of specimen other than nasopharyngeal swab, presence of viral mutation(s) within the areas targeted by this assay, and inadequate number of viral copies (<250 copies / mL). A negative result must be combined with clinical observations, patient history, and epidemiological information.  Fact Sheet for Patients:   StrictlyIdeas.no  Fact Sheet for Healthcare Providers: BankingDealers.co.za  This test is not yet approved or  cleared by the [place] FDA and has been authorized for detection and/or diagnosis of SARS-CoV-2 by FDA under an Emergency Use Authorization (EUA).  This EUA will remain in effect (meaning this test can be used) for the duration of the COVID-19 declaration under Section 564(b)(1) of the Act, 21 U.S.C. section 360bbb-3(b)(1), unless the authorization is terminated or revoked sooner.  Performed at [facility], [place] [address] [place], [place] [address]       Radiology Studies: DG CHEST PORT 1 VIEW  Result Date: 08/10/2019 CLINICAL DATA:  Fever and shortness of breath EXAM: PORTABLE CHEST 1 VIEW COMPARISON:  08/05/2019 and 07/23/2019 FINDINGS: Right-sided PICC line unchanged with tip over the SVC. Lungs are adequately inflated with stable linear scarring over the right midlung. No lobar consolidation or effusion. Cardiomediastinal silhouette and remainder of the exam is unchanged. IMPRESSION: No acute cardiopulmonary disease. Stable linear  scarring right midlung. Electronically Signed   By: Marin Olp M.D.   On: 08/10/2019 16:16      Scheduled Meds: . sodium chloride   Intravenous Once  . allopurinol  300 mg Oral Daily  . amLODipine  10 mg Oral Daily  . vitamin C  500 mg Oral BID  . B-complex with vitamin C  1 tablet Oral Daily  . Chlorhexidine Gluconate Cloth  6 each Topical Daily  . enoxaparin (LOVENOX) injection  40 mg Subcutaneous Q24H  . folic acid  1 mg Oral Daily  . insulin aspart  0-5 Units Subcutaneous QHS  . insulin aspart  0-9 Units Subcutaneous TID WC  . insulin glargine  10 Units Subcutaneous Daily  . nadolol  20 mg Oral Daily  . pantoprazole  40 mg Oral Q0600   Continuous Infusions:   Assessment/Plan:  1.  Acute on chronic normocytic anemia: Hgb 6.7 on presentation (was 8.4 at recent discharge. Previously ranging between 10-13).  No grossly bloody stool seen on rectal exam and FOBT negative.Hemoglobin has steadily improved and been stable at 9.5-9.7 following 2 units of PRBC.  Seen by GI and recommended outpatient colonoscopy.  Given elevated bilirubin, recent diagnosis/suspicion for endocarditis-hemolytic anemia could be a consideration although hemoglobin has steadily uptrended in the last few days. LDH only slightly elevated at 200. No evidence of retroperitoneal hematoma, although mild splenomegaly noted on abdominal imaging studies.  Platelets okay.  2.  Abdominal pain, diarrhea, elevated LFTs: Diarrhea resolved before C. difficile testing.  GI consulted and following along for uptrending labs, especially direct bilirubin and ALP.Work-up so far unremarkable including abdominal USG and MRCP other than GB stones versus sludge, hepatic steatosis/concern for early cirrhosis.  Total bilirubin has been trending up to 7.8, predominantly direct.  ALT115 >> 130. INR stable at 1.2 .  Mildly elevated GGT at 190.  HIV, hepatitis panel negative.  AMA negative.  Other hepatic serologies (anti-SMA, ANA, ceruloplasmin,  AAT, TTG) pending.  Unclear if related to cholestasis in the setting of mild sent antibiotics.  May have underlying ongoing rheumatological process.  3.  Bacteremia: Patient recently diagnosed with MRSA, enterococcal bacteremia- UTI and sacral decubitus likely being the source.  Patient refused TEE.  Repeat blood cultures on 6/18 showed no growth.  Discharged with recommendations for IV vancomycin for total of 6 weeks until 7/26 via RUE PICC line.  Patient been on IV Vanco here.  Also added on Rocephin with Flagyl on 7/4 evening and concern for possible cholangitis given rising MEWS score, fevers.  Will request ID follow-up--she was supposed to see Dr. Megan Salon on 7/26.  4.  Rash: Patient has been receiving IV vancomycin as outpatient and during the hospital course here without any intolerance.  Rocephin, IV Flagyl however both the new antibiotics that were added yesterday evening before patient developed rash. Patient did receive Benadryl yesterday.  Of note, patient does have history of vitiligo per record.  Currently has residual rash along the right upper extremity.  Discussed with infectious diseases Dr. Drucilla Schmidt regarding appropriate antibiotic course as patient also reports developing itching/rash at the facility on IV vancomycin.  ID recommended starting on IV daptomycin instead and they will follow-up in a.m.  Continue Benadryl as needed for itching.  Will order 1 dose of IV steroids given patient's persistent complaint in spite of Benadryl.  5.  Hypertension: Currently on Norvasc and nadolol.  Somewhat bradycardic on presentation but been stable with heart rate 60-70 in the last few days.  Blood pressure however continues to remain soft with systolic in low 683F.  Will reduce Norvasc dosage from 10 mg to 2.5 mg and monitor response.  6.  Diabetes mellitus type 2: Poorly controlled with hyperglycemia and hemoglobin A1c 10.4 on 6/15.  Continue Lantus and sliding scale insulin.  Glipizide  held for now.   Monitor blood glucose and adjust insulin as needed.  7.  Hyperlipidemia: Statins held in concern for transaminitis.  8.  AKI: Patient's baseline creatinine appears to be around 0.7-0.9.  Patient presented with creatinine 1.5 which improved to 1.2 over the weekend but up again at 1.54 today.  9.  Hypokalemia: Replace p.o.  Most recent EKG with improved QTC to 367 ms.  10. Sacral decubitus stage I: Wound care consulted.  11.  Gout: Continue allopurinol.  12. Obesity:Estimated body mass index is 45.82 kg/m   DVT prophylaxis: Lovenox Code Status: Full code Family / Patient Communication: Daughter, [name] been communicating with the medical team.  Not present at bedside today.  Updated patient and answered questions to her satisfaction. Disposition Plan:   Status is: Inpatient  Remains inpatient appropriate because:IV treatments appropriate due to intensity of illness or inability to take PO   Dispo: The patient is from: SNF              Anticipated d/c is to: SNF              Anticipated d/c date is: 3 days              Patient currently is not medically stable to d/c.        Time spent: 40 minutes     >50% time spent in discussions with care team and coordination of care.    Guilford Shi, MD [facility] Pager in [place]  If 7PM-7AM, please contact night-coverage [email or web address] 08/11/2019, 7:53 AM

## 2019-08-11 NOTE — Progress Notes (Signed)
Pharmacy Antibiotic Note  Heather Petty is a 69 y.o. female admitted on 08/05/2019 with MRSA and enterococcal bacteremia, Klebsiella UTI, and possible cholangitis. Patient was recently discharged 08/01/19 to SNF on vancomycin 1 gm IV q12hr x 6 wks, end date 7/26/21or MRSA and enterococcal bacteremia, secondary to infected sacral decubitus ulcer and UTI. TTE negative for valvular vegetations, patient refused TEE.   Pharmacy had been consulted for vancomycin and Zosyn dosing (Zosyn changed to ceftriaxone/Flagyl yesterday; pt has subsequently developed a rash, and antibiotics were held for rash and concern for 'red man syndrome'). ID has been consulted and changed antibiotic therapy to daptomycin, per pharmacy consult.  WBC 4.6, Tmax 102.5 despite therapeutic vancomycin dosing. Scr 1.54 (up from 1.26 yesterday), CrCl 39 ml/min; CK (6/16) 591  Plan: Daptomycin 8 mg/kg adjusted body wt (600 mg) IV Q 24 hrs  Monitor WBC (including eosinophils), temp, clinical improvement, renal function, CK (baseline, weekly) F/U antibiotic duration  Height: 5' 1"  (154.9 cm) Weight: 110 kg (242 lb 8.1 oz) IBW/kg (Calculated) : 47.8  Adjusted Body Weight: 72.7 kg  Temp (24hrs), Avg:99.9 F (37.7 C), Min:98.1 F (36.7 C), Max:102.5 F (39.2 C)  Recent Labs  Lab 08/07/19 0316 08/07/19 1653 08/08/19 0450 08/08/19 1000 08/08/19 2200 08/09/19 0345 08/10/19 0425 08/10/19 0722 08/10/19 1647 08/10/19 2108 08/11/19 0520  WBC 6.4  --  4.8  --   --  4.8  --  4.4  --   --  4.6  CREATININE 1.42*   < > 1.22* 1.27* 1.28* 1.27* 1.26*  --   --   --  1.54*  LATICACIDVEN  --   --   --   --   --   --   --   --  0.9 0.9  --   VANCORANDOM  --    < >  --  27 21  --   --   --   --   --   --    < > = values in this interval not displayed.    Estimated Creatinine Clearance: 39 mL/min (A) (by C-G formula based on SCr of 1.54 mg/dL (H)).    Allergies  Allergen Reactions  . Lasix [Furosemide] Other (See Comments)     IV-LASIX ==> Reaction: Burning    Antimicrobials this admission: Vancomycin started previous admission 6/14>> (Planned end date 09/01/19; per SNF MAR, LD given 6/27 2230, thus none on 6/28, 6/29) >> 7/4 Ceftriaxone 7/4 >> 7/4 Flagyl 7/4 >> 7/5 Daptomycin 7/5 >>  Dose adjustments this admission: 6/23 VT= 20 on 1g Q12 hr  6/30 VR= 19 (drawn 16.5 hr from 1250 mg dose) 7/1 VT = 23 on 1250 Q 24hr > decr to 1 g Q 24 hr 7/2 12 hr VR = 27  7/2 VT= 21 on 1 g Q 24 hr > decreased to 750 mg Q 24 hr  Previous admission microbiology results:  6/14 BCID: MRSA, Enterococcus, staph hominis 6/14 Ucx: 80,000 Klebsiella pneumoniae (susceptible to all abx tested except ampicillin) 6/14 Bcx: 2/2 MRSA per BCID  6/15 Bcx X2: MRSA 6/17 Bcx X 2: MRSA 6/18 Bcx X 2: NG/final 6/21 Ucx: NG/final  Thank you for allowing pharmacy to be a part of this patient's care.  Gillermina Hu, PharmD, BCPS, [facility] Clinical Pharmacist 08/11/19 16:00 PM

## 2019-08-11 NOTE — Progress Notes (Signed)
Progress Note  CC:   Elevated LFTs       ASSESSMENT AND PLAN:   Heather Petty a 69 y.o.femalepmh significant for but not limited to asthma, depression, insulin-dependent type 2 diabetes, gout, hypertension, hyperlipidemia, morbid obesity, schizophrenia, CVA   # Acute on chronic elevation of LFTs.  --LFTs chronically elevated since at least 2019 thought hyperbilirubinemia more of a new problem. Predominantly direct elevation but no evidence for biliary obstruction on imaging.  --Some improvement in ALK phos and bilirubin overnight (420 to 363 and 7.8 to 7.1).  --ALT continues to rise 115 >> 130>> 243.  INR stable at 1.2 yesterday.  --Etiology of chronically elevated LFTs not yet clear. So far, viral, autoimmune, genetic markers negative. Ferritin is elevated at 1095 but normal TIBC and iron sat. Elevated ferritin could be acute phase reaction. She does have hepatic steatosis on imaging. Also, concern for early cirrhosis with mild splenomegaly and mild perihepatic ascites.  --Etiology of acute elevation in LFTs could be secondary to infection and / or DILI from antibiotics.  -- Her HB S ab is negative, should get outpatient vaccination. 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   # Fevers / tachypnea.  --CXR negative. Blood cultures pending.  --Rash / itching on chest and LUE. Antiobiotics on hold due to possible reaction.  # Cholelithiasis vrs gallbladder sludge.  --No evidence for cholecystitis nor choledocholithiasis.   # [place] anemia --Hgb improved and stable at 9.5, up from 6.7 following 2 u PRBC --no overt GI bleeding --celiac studies pending --Will need outpatient colonoscopy    SUBJECTIVE    No abdominal pain. Feels okay except for generalized itching.    OBJECTIVE:     Vital signs in last 24 hours: Temp:  [98.1 F (36.7 C)-102.5 F (39.2 C)] 98.1 F (36.7 C) (07/05  1108) Pulse Rate:  [56-75] 56 (07/05 1108) Resp:  [17-28] 17 (07/05 1108) BP: (93-161)/(41-62) 93/57 (07/05 1108) SpO2:  [92 %-100 %] 96 % (07/05 1108) Last BM Date: 08/09/19 General:   Alert, in NAD Heart:  Regular rate and rhythm.  Erythema on inside of LUE. No lower extremity edema   Pulm: Normal respiratory effort   Abdomen:  Soft,  nontender, nondistended.  Normal bowel sounds.          Neurologic:  Alert and  oriented,  grossly normal neurologically. Psych:  cooperative.  .   Intake/Output from previous day: 07/04 0701 - 07/05 0700 In: 1080 [P.O.:630; IV Piggyback:450] Out: 950 [Urine:950] Intake/Output this shift: No intake/output data recorded.  Lab Results: Recent Labs    08/09/19 0345 08/10/19 0722 08/11/19 0520  WBC 4.8 4.4 4.6  HGB 9.0* 9.5* 9.7*  HCT 29.0* 30.1* 30.4*  PLT 193 180 195   BMET Recent Labs    08/09/19 0345 08/10/19 0425 08/11/19 0520  NA 141 137 132*  K 3.7 3.6 3.3*  CL 110 105 101  CO2 23 24 20*  GLUCOSE 63* 101* 205*  BUN _0 CREATININE 1.27* 1.26* 1.54*  CALCIUM 8.7* 8.3* 7.9*   LFT Recent Labs    08/10/19 0425 08/10/19 1220 08/11/19 0520  PROT   < >  --  5.4*  ALBUMIN   < >  --  2.3*  AST   < >  --  205*  ALT   < >  --  243*  ALKPHOS   < >  --  363*  BILITOT   < > 7.8* 7.1*  BILIDIR  --  5.4*  --   IBILI  --  2.4*  --    < > = values in this interval not displayed.   PT/INR No results for input(s): LABPROT, INR in the last 72 hours. Hepatitis Panel Recent Labs    08/09/19 0345  HEPBSAG NON REACTIVE  HCVAB NON REACTIVE    DG CHEST PORT 1 VIEW  Result Date: 08/10/2019 CLINICAL DATA:  Fever and shortness of breath EXAM: PORTABLE CHEST 1 VIEW COMPARISON:  08/05/2019 and 07/23/2019 FINDINGS: Right-sided PICC line unchanged with tip over the SVC. Lungs are adequately inflated with stable linear scarring over the right midlung. No lobar consolidation or effusion. Cardiomediastinal silhouette and remainder of the  exam is unchanged. IMPRESSION: No acute cardiopulmonary disease. Stable linear scarring right midlung. Electronically Signed   By: Marin Olp M.D.   On: 08/10/2019 16:16     Principal Problem:   Diarrhea Active Problems:   HTN (hypertension)   Acute kidney injury ([place])   Wound of sacral region   GERD (gastroesophageal reflux disease)   Morbid obesity with BMI of 50.0-59.9, adult (HCC)   Stage II pressure ulcer of sacral region ([place])   MRSA bacteremia   Vitiligo   Anemia   Splenomegaly     LOS: 6 days   [name] ,NP 08/11/2019, 1:21 PM

## 2019-08-11 NOTE — Progress Notes (Addendum)
Physical Therapy Treatment Patient Details Name: Heather Petty MRN: [id number] DOB: 1950/10/25 Today's Date: 08/11/2019    History of Present Illness Pt is a 69 y.o. F with hx HTN, IDDM, obesity, hx CVA, schizophrenia/depression and asthma who presents from [facility] on [facility] for diarrhea and decreased Hgb values. Recent admission for AMS, sepsis with d/c 6/24.    PT Comments    Pt was seen for mobility and declines OOB but agreed to strengthening LE's.  Pt is in some discomfort to move legs, but with stretches to legs is able to more comfortably move.   Her plan is to continue on with progressing strengthening, standing balance control and endurance work to increase safety and independence with gait.  Follow for these needs, make pt more reasonably able to walk with less help.  Follow Up Recommendations  SNF     Equipment Recommendations  None recommended by PT    Recommendations for Other Services       Precautions / Restrictions Precautions Precautions: Fall Precaution Comments: monitor BP if getting OOB Restrictions Weight Bearing Restrictions: No    Mobility  Bed Mobility Overal bed mobility: Needs Assistance Bed Mobility:  (scooting up in bed)           General bed mobility comments: max assist to scoot up in bed  Transfers                 General transfer comment: deferred by pt  Ambulation/Gait                 Stairs             Wheelchair Mobility    Modified Rankin (Stroke Patients Only)       Balance                                            Cognition Arousal/Alertness: Awake/alert Behavior During Therapy: WFL for tasks assessed/performed Overall Cognitive Status: Within Functional Limits for tasks assessed Area of Impairment: Problem solving                       Following Commands: Follows one step commands inconsistently Safety/Judgement: Decreased awareness of deficits    Problem Solving: Slow processing;Requires verbal cues General Comments: verbal prompts      Exercises General Exercises - Lower Extremity Ankle Circles/Pumps: AROM;AAROM;5 reps Quad Sets: AROM;10 reps Gluteal Sets: AAROM;10 reps Heel Slides: AAROM;10 reps Hip ABduction/ADduction: AAROM;10 reps Straight Leg Raises: AAROM;10 reps Hip Flexion/Marching: AAROM;10 reps    General Comments        Pertinent Vitals/Pain Pain Assessment: Faces Faces Pain Scale: Hurts little more Pain Location: knees Pain Descriptors / Indicators: Guarding Pain Intervention(s): Limited activity within patient's tolerance;Monitored during session;Repositioned    Home Living                      Prior Function            PT Goals (current goals can now be found in the care plan section) Acute Rehab PT Goals Patient Stated Goal: back to rehab Progress towards PT goals: Progressing toward goals    Frequency    Min 2X/week      PT Plan Current plan remains appropriate    Co-evaluation  AM-PAC PT "6 Clicks" Mobility   Outcome Measure  Help needed turning from your back to your side while in a flat bed without using bedrails?: A Little Help needed moving from lying on your back to sitting on the side of a flat bed without using bedrails?: A Little Help needed moving to and from a bed to a chair (including a wheelchair)?: A Lot Help needed standing up from a chair using your arms (e.g., wheelchair or bedside chair)?: A Lot Help needed to walk in hospital room?: A Lot Help needed climbing 3-5 steps with a railing? : Total 6 Click Score: 13    End of Session   Activity Tolerance: Patient limited by fatigue;Treatment limited secondary to medical complications (Comment) Patient left: in bed;with call bell/phone within reach;with bed alarm set Nurse Communication: Mobility status PT Visit Diagnosis: Unsteadiness on feet (R26.81);Muscle weakness (generalized)  (M62.81)     Time: 5208-0223 PT Time Calculation (min) (ACUTE ONLY): 24 min  Charges:  $Therapeutic Exercise: 8-22 mins $Therapeutic Activity: 8-22 mins                    Ramond Dial 08/11/2019, 10:33 PM  Mee Hives, PT MS Acute Rehab Dept. Number: [place] and [place]

## 2019-08-12 DIAGNOSIS — R7401 Elevation of levels of liver transaminase levels: Secondary | ICD-10-CM

## 2019-08-12 DIAGNOSIS — K7581 Nonalcoholic steatohepatitis (NASH): Secondary | ICD-10-CM

## 2019-08-12 DIAGNOSIS — R509 Fever, unspecified: Secondary | ICD-10-CM

## 2019-08-12 DIAGNOSIS — R21 Rash and other nonspecific skin eruption: Secondary | ICD-10-CM | POA: Diagnosis not present

## 2019-08-12 DIAGNOSIS — L270 Generalized skin eruption due to drugs and medicaments taken internally: Secondary | ICD-10-CM

## 2019-08-12 DIAGNOSIS — R197 Diarrhea, unspecified: Secondary | ICD-10-CM | POA: Diagnosis not present

## 2019-08-12 DIAGNOSIS — Z6841 Body Mass Index (BMI) 40.0 and over, adult: Secondary | ICD-10-CM

## 2019-08-12 DIAGNOSIS — K7460 Unspecified cirrhosis of liver: Secondary | ICD-10-CM

## 2019-08-12 LAB — PROCALCITONIN: Procalcitonin: 7.41 ng/mL

## 2019-08-12 LAB — GLUCOSE, CAPILLARY
Glucose-Capillary: 230 mg/dL — ABNORMAL HIGH (ref 70–99)
Glucose-Capillary: 316 mg/dL — ABNORMAL HIGH (ref 70–99)
Glucose-Capillary: 276 mg/dL — ABNORMAL HIGH (ref 70–99)
Glucose-Capillary: 214 mg/dL — ABNORMAL HIGH (ref 70–99)

## 2019-08-12 LAB — TISSUE TRANSGLUTAMINASE, IGG: Tissue Transglut Ab: <2 U/mL (ref 0–5)

## 2019-08-12 LAB — COMPREHENSIVE METABOLIC PANEL
ALT: 254 U/L — ABNORMAL HIGH (ref 0–44)
AST: 143 U/L — ABNORMAL HIGH (ref 15–41)
Albumin: 2.3 g/dL — ABNORMAL LOW (ref 3.5–5.0)
Alkaline Phosphatase: 315 U/L — ABNORMAL HIGH (ref 38–126)
Anion gap: 12 (ref 5–15)
BUN: 32 mg/dL — ABNORMAL HIGH (ref 8–23)
CO2: 20 mmol/L — ABNORMAL LOW (ref 22–32)
Calcium: 8.2 mg/dL — ABNORMAL LOW (ref 8.9–10.3)
Chloride: 100 mmol/L (ref 98–111)
Creatinine, Ser: 1.7 mg/dL — ABNORMAL HIGH (ref 0.44–1.00)
GFR calc Af Amer: 35 mL/min — ABNORMAL LOW (ref >60)
GFR calc non Af Amer: 30 mL/min — ABNORMAL LOW (ref >60)
Glucose, Bld: 246 mg/dL — ABNORMAL HIGH (ref 70–99)
Potassium: 3.8 mmol/L (ref 3.5–5.1)
Sodium: 132 mmol/L — ABNORMAL LOW (ref 135–145)
Total Bilirubin: 6.3 mg/dL — ABNORMAL HIGH (ref 0.3–1.2)
Total Protein: 5.2 g/dL — ABNORMAL LOW (ref 6.5–8.1)

## 2019-08-12 LAB — CBC
HCT: 28.5 % — ABNORMAL LOW (ref 36.0–46.0)
Hemoglobin: 8.8 g/dL — ABNORMAL LOW (ref 12.0–15.0)
MCH: 29 pg (ref 26.0–34.0)
MCHC: 30.9 g/dL (ref 30.0–36.0)
MCV: 94.1 fL (ref 80.0–100.0)
Platelets: 167 10*3/uL (ref 150–400)
RBC: 3.03 MIL/uL — ABNORMAL LOW (ref 3.87–5.11)
RDW: 18.7 % — ABNORMAL HIGH (ref 11.5–15.5)
WBC: 3.6 10*3/uL — ABNORMAL LOW (ref 4.0–10.5)
nRBC: 0 % (ref 0.0–0.2)

## 2019-08-12 LAB — NUCLEOTIDASE, 5', BLOOD: 5-Nucleotidase: 40 IU/L — ABNORMAL HIGH (ref 0–10)

## 2019-08-12 LAB — FANA STAINING PATTERNS: Homogeneous Pattern: 1:80 {titer}

## 2019-08-12 LAB — CK: Total CK: 16 U/L — ABNORMAL LOW (ref 38–234)

## 2019-08-12 LAB — ANTINUCLEAR ANTIBODIES, IFA: ANA Ab, IFA: POSITIVE — AB

## 2019-08-12 MED ORDER — DAPTOMYCIN IV (FOR PTA / DISCHARGE USE ONLY): 600.0000 mg | INTRAVENOUS | 0 refills | Status: AC

## 2019-08-12 MED ORDER — SODIUM CHLORIDE 0.9 % IV SOLN: INTRAVENOUS | Status: AC

## 2019-08-12 NOTE — Progress Notes (Signed)
PHARMACY CONSULT NOTE FOR:  OUTPATIENT  PARENTERAL ANTIBIOTIC THERAPY (OPAT)  Indication: MRSA/Enterococcus bacteremia Regimen: Daptomycin 600 mg IV Q 24 hours  End date: 09/01/2019  IV antibiotic discharge orders are pended. To discharging provider:  please sign these orders via discharge navigator,  Select New Orders & click on the button choice - Manage This Unsigned Work.     Thank you for allowing pharmacy to be a part of this patient's care.  Jimmy Footman, PharmD, BCPS, BCIDP Infectious Diseases Clinical Pharmacist Phone: [phone] 08/12/2019, 3:37 PM

## 2019-08-12 NOTE — Progress Notes (Signed)
Progress Note    ASSESSMENT AND PLAN:   Case discussed with Dr. Tarri Glenn.  I am taking over the service from today onwards.  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  Acute elevation of LFTs likely d/t DILI/infection. Neg serologic eval. No ETOH. LFTs are trending down (expect ALT to come down gradually over the next few weeks).  Patient feels better.  Plan: -Since LFTs are trending down and pt feels better, will hold off on liver Bx at this time.  Can always consider it as an outpatient. -Continue supportive treatment. -FU in GI clinic as outpt in 2-3 weeks.  Anderson Malta to arrange for FU -Avoid any hepatotoxic medications as outpt. -We will sign off for now. -Please call with any questions or concerns. -Of note that she will also need outpatient colonoscopy.     SUBJECTIVE  She feels significantly better Itching has decreased No further diarrhea Has been eating better.  Her appetite has returned.      OBJECTIVE:     Vital signs in last 24 hours: Temp:  [98 F (36.7 C)-99.3 F (37.4 C)] 98.4 F (36.9 C) (07/06 1210) Pulse Rate:  [51-66] 61 (07/06 1210) Resp:  [14-28] 24 (07/06 1210) BP: (90-122)/(38-65) 122/52 (07/06 1210) SpO2:  [94 %-97 %] 96 % (07/06 1210) Last BM Date: 08/11/19 General:   Alert, well-developed female in NAD EENT:  Normal hearing, has jaundice, conjunctive pink.  Heart:  Regular rate and rhythm; no murmur.  No lower extremity edema   Pulm: Normal respiratory effort, lungs CTA bilaterally without wheezes or crackles. Abdomen:  Soft, nondistended, nontender.  Normal bowel sounds,.       Neurologic:  Alert and  oriented x4;  grossly normal neurologically. Psych:  Pleasant, cooperative.  Normal mood and affect.   Intake/Output from previous day: 07/05 0701 - 07/06 0700 In: -  Out: 650 [Urine:650] Intake/Output this shift: Total I/O In: 236 [P.O.:236] Out: -   Lab Results: Recent Labs     08/10/19 0722 08/11/19 0520 08/12/19 0451  WBC 4.4 4.6 3.6*  HGB 9.5* 9.7* 8.8*  HCT 30.1* 30.4* 28.5*  PLT 180 195 167   BMET Recent Labs    08/10/19 0425 08/11/19 0520 08/12/19 0451  NA 137 132* 132*  K 3.6 3.3* 3.8  CL 105 101 100  CO2 24 20* 20*  GLUCOSE 101* 205* 246*  BUN 14 18 32*  CREATININE 1.26* 1.54* 1.70*  CALCIUM 8.3* 7.9* 8.2*   LFT Recent Labs    08/10/19 1220 08/11/19 0520 08/12/19 0451  PROT  --    < > 5.2*  ALBUMIN  --    < > 2.3*  AST  --    < > 143*  ALT  --    < > 254*  ALKPHOS  --    < > 315*  BILITOT 7.8*   < > 6.3*  BILIDIR 5.4*  --   --   IBILI 2.4*  --   --    < > = values in this interval not displayed.   PT/INR No results for input(s): LABPROT, INR in the last 72 hours. Hepatitis Panel No results for input(s): HEPBSAG, HCVAB, HEPAIGM, HEPBIGM in the last 72 hours.  DG CHEST PORT 1 VIEW  Result Date: 08/10/2019 CLINICAL DATA:  Fever and shortness of breath EXAM: PORTABLE CHEST 1 VIEW COMPARISON:  08/05/2019 and 07/23/2019 FINDINGS: Right-sided PICC line unchanged with tip over the SVC. Lungs are adequately  inflated with stable linear scarring over the right midlung. No lobar consolidation or effusion. Cardiomediastinal silhouette and remainder of the exam is unchanged. IMPRESSION: No acute cardiopulmonary disease. Stable linear scarring right midlung. Electronically Signed   By: Marin Olp M.D.   On: 08/10/2019 16:16     Principal Problem:   Diarrhea Active Problems:   HTN (hypertension)   Acute kidney injury ([place])   Wound of sacral region   GERD (gastroesophageal reflux disease)   Morbid obesity with BMI of 50.0-59.9, adult (HCC)   Stage II pressure ulcer of sacral region ([place])   MRSA bacteremia   Vitiligo   Anemia   Splenomegaly     LOS: 7 days     Carmell Austria, MD 08/12/2019, 1:40 PM [facility] [phone]

## 2019-08-12 NOTE — Consult Note (Signed)
[facility] for Infectious Disease       Reason for Consult: fever    Referring Physician: Dr. Earnest Conroy  Principal Problem:   Diarrhea Active Problems:   HTN (hypertension)   Acute kidney injury ([place])   Wound of sacral region   GERD (gastroesophageal reflux disease)   Morbid obesity with BMI of 50.0-59.9, adult ([place])   Stage II pressure ulcer of sacral region ([place])   MRSA bacteremia   Vitiligo   Anemia   Splenomegaly   . sodium chloride   Intravenous Once  . allopurinol  300 mg Oral Daily  . amLODipine  2.5 mg Oral Daily  . vitamin C  500 mg Oral BID  . B-complex with vitamin C  1 tablet Oral Daily  . Chlorhexidine Gluconate Cloth  6 each Topical Daily  . enoxaparin (LOVENOX) injection  50 mg Subcutaneous Q24H  . folic acid  1 mg Oral Daily  . insulin aspart  0-5 Units Subcutaneous QHS  . insulin aspart  0-9 Units Subcutaneous TID WC  . insulin glargine  10 Units Subcutaneous Daily  . nadolol  20 mg Oral Daily  . pantoprazole  40 mg Oral Q0600    Recommendations: Continue with daptomycin through 7/26  I will not follow up, call with any new concerns. thanks  Assessment: She has a rash of unknown etiology but resolving.  She is now off of vancomycin though unclear what the cause was.  Since she is tolerating daptomycin, will continue her course with this.  She did have a fever x 2 but doing well now so no further work up indicated.  I suspect it was related to whatever was causing the rash.     Antibiotics: daptomycin  HPI: Heather Petty is a 69 y.o. female with a history of bacteremia with MRSA and Enterococcus came back to the hospital with anemia.  Also with transaminitis which is resolving.  She is feeling better since admission. She did have an episode of acute fever with tachycardia, now resolved.  She is followed by GI for the cirrhosis and LFT abnormalities.     Review of Systems:  Constitutional: negative for fevers, chills and  fatigue Integument/breast: positive for resolving rash All other systems reviewed and are negative    Past Medical History:  Diagnosis Date  . AKI (acute kidney injury) ([place]) 12/17/2017  . Arthritis   . Asthma   . Depression   . Diabetes mellitus   . Gout   . High cholesterol   . Hypertension   . Morbid obesity ([place])   . Pneumonia 12/17/2017  . Schizophrenia ([place])   . Stroke [facility])     Social History   Tobacco Use  . Smoking status: Former Smoker    Quit date: 02/06/1974    Years since quitting: 45.5  . Smokeless tobacco: Never Used  Vaping Use  . Vaping Use: Never used  Substance Use Topics  . Alcohol use: No  . Drug use: No    Family History  Family history unknown: Yes    Allergies  Allergen Reactions  . Lasix [Furosemide] Other (See Comments)    IV-LASIX ==> Reaction: Burning    Physical Exam: Constitutional: in no apparent distress  Vitals:   08/12/19 0810 08/12/19 1210  BP: (!) 119/44 (!) 122/52  Pulse: (!) 57 61  Resp: 17 (!) 24  Temp: 98.1 F (36.7 C) 98.4 F (36.9 C)  SpO2: 96% 96%   EYES: anicteric Cardiovascular: Cor RRR Respiratory:  clear; GI: Bowel sounds are normal, liver is not enlarged, spleen is not enlarged Musculoskeletal: no pedal edema noted Skin: some areas of maculopappilar rash on arms.  Legs clear.  Lab Results  Component Value Date   WBC 3.6 (L) 08/12/2019   HGB 8.8 (L) 08/12/2019   HCT 28.5 (L) 08/12/2019   MCV 94.1 08/12/2019   PLT 167 08/12/2019    Lab Results  Component Value Date   CREATININE 1.70 (H) 08/12/2019   BUN 32 (H) 08/12/2019   NA 132 (L) 08/12/2019   K 3.8 08/12/2019   CL 100 08/12/2019   CO2 20 (L) 08/12/2019    Lab Results  Component Value Date   ALT 254 (H) 08/12/2019   AST 143 (H) 08/12/2019   ALKPHOS 315 (H) 08/12/2019     Microbiology: Recent Results (from the past 240 hour(s))  SARS Coronavirus 2 by RT PCR (hospital order, performed in [place] hospital lab) Nasopharyngeal  Nasopharyngeal Swab     Status: None   Collection Time: 08/05/19  7:50 PM   Specimen: Nasopharyngeal Swab  Result Value Ref Range Status   SARS Coronavirus 2 NEGATIVE NEGATIVE Final    Comment: (NOTE) SARS-CoV-2 target nucleic acids are NOT DETECTED.  The SARS-CoV-2 RNA is generally detectable in upper and lower respiratory specimens during the acute phase of infection. The lowest concentration of SARS-CoV-2 viral copies this assay can detect is 250 copies / mL. A negative result does not preclude SARS-CoV-2 infection and should not be used as the sole basis for treatment or other patient management decisions.  A negative result may occur with improper specimen collection / handling, submission of specimen other than nasopharyngeal swab, presence of viral mutation(s) within the areas targeted by this assay, and inadequate number of viral copies (<250 copies / mL). A negative result must be combined with clinical observations, patient history, and epidemiological information.  Fact Sheet for Patients:   StrictlyIdeas.no  Fact Sheet for Healthcare Providers: BankingDealers.co.za  This test is not yet approved or  cleared by the [place] FDA and has been authorized for detection and/or diagnosis of SARS-CoV-2 by FDA under an Emergency Use Authorization (EUA).  This EUA will remain in effect (meaning this test can be used) for the duration of the COVID-19 declaration under Section 564(b)(1) of the Act, 21 U.S.C. section 360bbb-3(b)(1), unless the authorization is terminated or revoked sooner.  Performed at [facility], [place] [address], [place], [place] [address]   Culture, blood (routine x 2)     Status: None (Preliminary result)   Collection Time: 08/10/19  4:48 PM   Specimen: BLOOD LEFT HAND  Result Value Ref Range Status   Specimen Description BLOOD LEFT HAND  Final   Special Requests   Final    BOTTLES DRAWN AEROBIC  AND ANAEROBIC Blood Culture adequate volume   Culture   Final    NO GROWTH 2 DAYS Performed at [facility], [place] [address], [place], [place] [address]    Report Status PENDING  Incomplete  Culture, blood (routine x 2)     Status: None (Preliminary result)   Collection Time: 08/10/19  4:49 PM   Specimen: BLOOD RIGHT HAND  Result Value Ref Range Status   Specimen Description BLOOD RIGHT HAND  Final   Special Requests   Final    BOTTLES DRAWN AEROBIC AND ANAEROBIC Blood Culture adequate volume   Culture   Final    NO GROWTH 2 DAYS Performed at [facility], [place] [address], [place],  [address]    Report Status PENDING  Incomplete    Thayer Headings, [place] for Infectious Disease [place] [email or web address] 08/12/2019, 1:34 PM

## 2019-08-12 NOTE — TOC Progression Note (Signed)
Transition of Care [facility]) - Progression Note    Patient Details  Name: Heather Petty MRN: [id number] Date of Birth: [birth year]  Transition of [facility]) CM/SW [place], [place] Phone Number: 08/12/2019, 4:24 PM  Clinical Narrative:    CSW spoke with Olivia Mackie at [facility].  Pt can return to facility tomorrow if stable for discharge.  CSW will continue to follow for disposition planning.   Expected Discharge Plan: [place] Barriers to Discharge: Continued Medical Work up  Expected Discharge Plan and Services Expected Discharge Plan: [place] Choice: [place] arrangements for the past 2 months: Single Family Home Expected Discharge Date: 08/08/19                                     Social Determinants of Health (SDOH) Interventions    Readmission Risk Interventions No flowsheet data found.

## 2019-08-12 NOTE — Progress Notes (Addendum)
PROGRESS NOTE    Heather Petty  HBZ:[id number]  DOB: 13-Apr-1950  PCP: [name], MD Admit date:08/05/2019 Chief compliant: abdominal pain, abnormal lab 69 y.o.femalewith history ofasthma, depression, insulin-dependent type 2 diabetes, gout, hypertension, hyperlipidemia, morbid obesity, schizophrenia, CVA presented from [facility] for evaluation of low hemoglobin (5.6),  abdominal pain,diarrhea and shortness of breath. Patient was recently admitted to the hospital 6/14-6/24 for sepsis secondary to MRSA and enterococcal bacteremia. Source felt to be secondary to UTI and infected sacral decubitus ulcer. Urine culture grew Klebsiella. No valvular vegetationson TTE. Infectious disease was consulted and recommended TEE which the patient refused. PICC line was placed on 6/22 and she was discharged with plan to continue IV vancomycin for total of 6 weeks until 7/26. Patient was sent here from [facility] because hemoglobin was low on lab work. She reported nonbloody diarrhea for 2 days. No nausea or vomiting. She had some mild lower abdominal discomfort earlier which has now resolved.  ED Course: Afebrile,bradycardic with HR in 50s, normotensive although soft BP-systolic 101-751.  Labs - no leukocytosis. Hgb 6.7 (was 8.4 at recent discharge. Previously ranging between 10-13).  No grossly bloody stool seen on rectal exam and FOBT negative.  BUN 22, creatinine 1.5. (Baseline 0.6-0.7).  Alk phos 440, T bili 2.2, ALT 78, AST 36.  Lipase nl. CXR wnl. Random blood glucose 232. Received 1 unit PRBC in the ED. Hospital course: Patient admitted to [facility] for further evaluation and management of anemia.  Stool work-up was negative and diarrhea resolved now.  Hemoglobin has improved and been stable at 9.5 following 2 units of PRBC. GI consulted on 7/2, they had recommended MRCP and sent off hepatic serologies.  Recommended colonoscopy as outpatient. Work-up for elevated LFTs so far unremarkable  including abdominal USG and MRCP other than GB stones versus sludge, hepatic steatosis/concern for early cirrhosis.  Total bilirubin has been trending up to 7.2, predominantly direct.  ALT115 >> 130. INR stable at 1.2 .  Mildly elevated GGT at 190.  Hospital course further complicated by temp spike to 102.76F, lactic acidosis on the evening of 7/4 prompting repeat blood cultures and infectious work-up again with chest x-ray, UA while on antibiotics.  Later in the night on-call team was summoned for evaluation of rash/itching and concern for "red man" syndrome.Antibiotics held until reevaluation this morning.   Subjective:  Still has itching but feels improved and right upper extremity rash somewhat better after 1 dose of steroids yesterday. Objective: Vitals:   08/12/19 0129 08/12/19 0409 08/12/19 0810 08/12/19 1210  BP: (!) 119/50 (!) 118/50 (!) 119/44 (!) 122/52  Pulse: (!) 51 (!) 55 (!) 57 61  Resp: 14 16 17  (!) 24  Temp:  98 F (36.7 C) 98.1 F (36.7 C) 98.4 F (36.9 C)  TempSrc:  Oral Oral Oral  SpO2: 97% 96% 96% 96%  Weight:      Height:        Intake/Output Summary (Last 24 hours) at 08/12/2019 1542 Last data filed at 08/12/2019 0925 Gross per 24 hour  Intake 236 ml  Output 650 ml  Net -414 ml   Filed Weights   08/05/19 1310  Weight: 110 kg    Physical Examination: General: Moderately built, no acute distress noted Head ENT: Atraumatic normocephalic, PERRLA but appears icteric, neck supple Heart: S1-S2 heard, regular rate and rhythm, no murmurs.  No leg edema noted Lungs: Equal air entry bilaterally, no rhonchi or rales on exam, no accessory muscle use Abdomen: Bowel sounds heard,  soft, nontender, nondistended. No organomegaly.  No CVA tenderness Extremities: No pedal edema.  Drug rash like shown below Neurological: Awake alert oriented x3, no focal weakness or numbness, strength and sensations to crude touch intact Skin: Scaling eczema, vitiligo along her back, digits and  left periorbital, lip area.  She now also has a drug rash along right upper extremity as shown below.     Data Reviewed: I have personally reviewed following labs and imaging studies  CBC: Recent Labs  Lab 08/08/19 0450 08/09/19 0345 08/10/19 0722 08/11/19 0520 08/12/19 0451  WBC 4.8 4.8 4.4 4.6 3.6*  HGB 8.6* 9.0* 9.5* 9.7* 8.8*  HCT 26.7* 29.0* 30.1* 30.4* 28.5*  MCV 94.7 94.8 95.9 93.8 94.1  PLT 196 193 180 195 413   Basic Metabolic Panel: Recent Labs  Lab 08/08/19 0450 08/08/19 1000 08/08/19 2200 08/09/19 0345 08/10/19 0425 08/11/19 0520 08/12/19 0451  NA 140  --   --  141 137 132* 132*  K 3.8  --   --  3.7 3.6 3.3* 3.8  CL 112*  --   --  110 105 101 100  CO2 21*  --   --  23 24 20* 20*  GLUCOSE 88  --   --  63* 101* 205* 246*  BUN 15  --   --  13 14 18  32*  CREATININE 1.22*   < > 1.28* 1.27* 1.26* 1.54* 1.70*  CALCIUM 8.6*  --   --  8.7* 8.3* 7.9* 8.2*   < > = values in this interval not displayed.   GFR: Estimated Creatinine Clearance: 35.3 mL/min (A) (by C-G formula based on SCr of 1.7 mg/dL (H)). Liver Function Tests: Recent Labs  Lab 08/08/19 0450 08/08/19 0450 08/09/19 0345 08/10/19 0425 08/10/19 1220 08/11/19 0520 08/12/19 0451  AST 63*  --  111* 95*  --  205* 143*  ALT 58*  --  115* 130*  --  243* 254*  ALKPHOS 448*  --  479* 420*  --  363* 315*  BILITOT 3.9*   < > 5.9* 7.2* 7.8* 7.1* 6.3*  PROT 5.3*  --  5.7* 5.6*  --  5.4* 5.2*  ALBUMIN 2.3*  --  2.5* 2.5*  --  2.3* 2.3*   < > = values in this interval not displayed.   No results for input(s): LIPASE, AMYLASE in the last 168 hours. No results for input(s): AMMONIA in the last 168 hours. Coagulation Profile: Recent Labs  Lab 08/08/19 1042  INR 1.2   Cardiac Enzymes: Recent Labs  Lab 08/12/19 0451  CKTOTAL 16*   BNP (last 3 results) No results for input(s): PROBNP in the last 8760 hours. HbA1C: No results for input(s): HGBA1C in the last 72 hours. CBG: Recent Labs  Lab  08/11/19 1159 08/11/19 1556 08/11/19 2140 08/12/19 0607 08/12/19 1307  GLUCAP 118* 195* 197* 230* 316*   Lipid Profile: No results for input(s): CHOL, HDL, LDLCALC, TRIG, CHOLHDL, LDLDIRECT in the last 72 hours. Thyroid Function Tests: No results for input(s): TSH, T4TOTAL, FREET4, T3FREE, THYROIDAB in the last 72 hours. Anemia Panel: No results for input(s): VITAMINB12, FOLATE, FERRITIN, TIBC, IRON, RETICCTPCT in the last 72 hours. Sepsis Labs: Recent Labs  Lab 08/10/19 0425 08/10/19 1647 08/10/19 2108 08/11/19 0520 08/12/19 0451  PROCALCITON 2.76  --   --  6.58 7.41  LATICACIDVEN  --  0.9 0.9  --   --     Recent Results (from the past 240 hour(s))  SARS Coronavirus 2  by RT PCR (hospital order, performed in [facility] hospital lab) Nasopharyngeal Nasopharyngeal Swab     Status: None   Collection Time: 08/05/19  7:50 PM   Specimen: Nasopharyngeal Swab  Result Value Ref Range Status   SARS Coronavirus 2 NEGATIVE NEGATIVE Final    Comment: (NOTE) SARS-CoV-2 target nucleic acids are NOT DETECTED.  The SARS-CoV-2 RNA is generally detectable in upper and lower respiratory specimens during the acute phase of infection. The lowest concentration of SARS-CoV-2 viral copies this assay can detect is 250 copies / mL. A negative result does not preclude SARS-CoV-2 infection and should not be used as the sole basis for treatment or other patient management decisions.  A negative result may occur with improper specimen collection / handling, submission of specimen other than nasopharyngeal swab, presence of viral mutation(s) within the areas targeted by this assay, and inadequate number of viral copies (<250 copies / mL). A negative result must be combined with clinical observations, patient history, and epidemiological information.  Fact Sheet for Patients:   StrictlyIdeas.no  Fact Sheet for Healthcare  Providers: BankingDealers.co.za  This test is not yet approved or  cleared by the [place] FDA and has been authorized for detection and/or diagnosis of SARS-CoV-2 by FDA under an Emergency Use Authorization (EUA).  This EUA will remain in effect (meaning this test can be used) for the duration of the COVID-19 declaration under Section 564(b)(1) of the Act, 21 U.S.C. section 360bbb-3(b)(1), unless the authorization is terminated or revoked sooner.  Performed at [facility], [place] [address] [place], [place] [address]   Culture, blood (routine x 2)     Status: None (Preliminary result)   Collection Time: 08/10/19  4:48 PM   Specimen: BLOOD LEFT HAND  Result Value Ref Range Status   Specimen Description BLOOD LEFT HAND  Final   Special Requests   Final    BOTTLES DRAWN AEROBIC AND ANAEROBIC Blood Culture adequate volume   Culture   Final    NO GROWTH 2 DAYS Performed at [facility], [place] [address] [place], [place] [address]    Report Status PENDING  Incomplete  Culture, blood (routine x 2)     Status: None (Preliminary result)   Collection Time: 08/10/19  4:49 PM   Specimen: BLOOD RIGHT HAND  Result Value Ref Range Status   Specimen Description BLOOD RIGHT HAND  Final   Special Requests   Final    BOTTLES DRAWN AEROBIC AND ANAEROBIC Blood Culture adequate volume   Culture   Final    NO GROWTH 2 DAYS Performed at [facility], [place] [address] [place], [place] [address]    Report Status PENDING  Incomplete      Radiology Studies: DG CHEST PORT 1 VIEW  Result Date: 08/10/2019 CLINICAL DATA:  Fever and shortness of breath EXAM: PORTABLE CHEST 1 VIEW COMPARISON:  08/05/2019 and 07/23/2019 FINDINGS: Right-sided PICC line unchanged with tip over the SVC. Lungs are adequately inflated with stable linear scarring over the right midlung. No lobar consolidation or effusion. Cardiomediastinal silhouette and remainder of the exam is unchanged.  IMPRESSION: No acute cardiopulmonary disease. Stable linear scarring right midlung. Electronically Signed   By: Marin Olp M.D.   On: 08/10/2019 16:16      Scheduled Meds: . sodium chloride   Intravenous Once  . allopurinol  300 mg Oral Daily  . amLODipine  2.5 mg Oral Daily  . vitamin C  500 mg Oral BID  .  B-complex with vitamin C  1 tablet Oral Daily  . Chlorhexidine Gluconate Cloth  6 each Topical Daily  . enoxaparin (LOVENOX) injection  50 mg Subcutaneous Q24H  . folic acid  1 mg Oral Daily  . insulin aspart  0-5 Units Subcutaneous QHS  . insulin aspart  0-9 Units Subcutaneous TID WC  . insulin glargine  10 Units Subcutaneous Daily  . nadolol  20 mg Oral Daily  . pantoprazole  40 mg Oral Q0600   Continuous Infusions: . DAPTOmycin (CUBICIN)  IV 600 mg (08/11/19 1720)     Assessment/Plan:  1.  Acute on chronic normocytic anemia: Hgb 6.7 on presentation (was 8.4 at recent discharge. Previously ranging between 10-13).  No grossly bloody stool seen on rectal exam and FOBT negative.Hemoglobin has steadily improved and been stable at 9.5-9.7 following 2 units of PRBC.  Seen by GI and recommended outpatient colonoscopy.  Given elevated bilirubin, recent diagnosis/suspicion for endocarditis-hemolytic anemia could be a consideration although hemoglobin has steadily uptrended in the last few days. LDH only slightly elevated at 200. No evidence of retroperitoneal hematoma, although mild splenomegaly noted on abdominal imaging studies.  Platelets okay.  2.  Abdominal pain, diarrhea, elevated LFTs: Diarrhea resolved before C. difficile testing.  GI consulted and been following along.Work-up so far unremarkable including abdominal USG and MRCP other than GB stones versus sludge, hepatic steatosis/concern for early cirrhosis.  Total bilirubin has been trending up to 7.8, predominantly direct.  ALT115 >> 130. INR stable at 1.2 .HIV, hepatitis panel negative.  AMA negative.  Other hepatic serologies  (anti-SMA, ANA, ceruloplasmin, AAT, TTG) pending.  Unclear if related to cholestasis in the setting of multiple antibiotics.  GI was contemplating liver biopsy if LFTs continue to worsen but seems like bilirubin is downtrending now to 6.3.  GI to follow-up and advise.  Will give mild hydration and repeat labs in AM.  Itching could be related to hyperbilirubinemia as well.  3.  Bacteremia: Patient recently diagnosed with MRSA, enterococcal bacteremia- UTI and sacral decubitus likely being the source.  Patient refused TEE.  Repeat blood cultures on 6/18 showed no growth.  Discharged with recommendations for IV vancomycin for total of 6 weeks until 7/26 via RUE PICC line.  Patient been on IV Vanco here.  Also added on Rocephin with Flagyl on 7/4 evening in concern for possible cholangitis given rising MEWS score, fevers.  All these antibiotics were discontinued after discussing with ID yesterday and transition to IV daptomycin.  Appreciate ID follow-up, no recommendations for TEE now.  4.  Rash: Patient has been receiving IV vancomycin as outpatient and during the hospital course here without any intolerance.  Rocephin, IV Flagyl however both the new antibiotics that were added yesterday evening before patient developed rash. Patient did receive Benadryl yesterday.  Of note, patient does have history of vitiligo per record.  Currently has residual rash along the right upper extremity.  Discussed with infectious diseases Dr. Drucilla Schmidt regarding appropriate antibiotic course as patient also reports developing itching/rash at the facility on IV vancomycin.  ID recommended IV daptomycin instead and she can likely be discharged back on the same to primary facility in a.m.  Continue Benadryl as needed for itching.   5.  Hypertension: Currently on Norvasc and nadolol.  Somewhat bradycardic on presentation but been stable with heart rate 60-70 in the last few days.  Blood pressure however continues to remain soft with  systolic in low 935T.  Reduced Norvasc dosage from 10 mg to  2.5 mg and BP appears to be low normal at this time.  Check orthostatics in a.m.  6.  Diabetes mellitus type 2: Poorly controlled with hyperglycemia and hemoglobin A1c 10.4 on 6/15.  Continue Lantus and sliding scale insulin.  Glipizide held for now.  Monitor blood glucose and adjust insulin as needed.  7.  Hyperlipidemia: Statins held in concern for transaminitis.  8.  AKI: Patient's baseline creatinine appears to be around 0.7-0.9.  Patient presented with creatinine 1.5 which improved to 1.2 over the weekend but up again at 1.54-> 1.7 today.  Will give IV hydration and follow-up labs in AM.  9.  Hypokalemia: Replace p.o.  Most recent EKG with improved QTC to 367 ms.  10. Sacral decubitus stage I: Wound care consulted.  11.  Gout: Continue allopurinol.  12. Obesity:Estimated body mass index is 45.82 kg/m   DVT prophylaxis: Lovenox Code Status: Full code Family / Patient Communication: Daughter, [name] been communicating with the medical team.  Not present at bedside today.  Called to Update--no answer, left a message. D/W patient and answered questions to her satisfaction. Disposition Plan:   Status is: Inpatient  Remains inpatient appropriate because:IV treatments appropriate due to intensity of illness or inability to take PO   Dispo: The patient is from: SNF              Anticipated d/c is to: SNF, [facility]              Anticipated d/c date is: 7/7 pending GI clearance, improvement in renal function.              Patient currently is not medically stable to d/c.        Time spent: 35 minutes     >50% time spent in discussions with care team and coordination of care.    Guilford Shi, MD [facility] Pager in [place]  If 7PM-7AM, please contact night-coverage [email or web address] 08/12/2019, 3:42 PM

## 2019-08-13 LAB — COMPREHENSIVE METABOLIC PANEL
ALT: 206 U/L — ABNORMAL HIGH (ref 0–44)
AST: 105 U/L — ABNORMAL HIGH (ref 15–41)
Albumin: 2.3 g/dL — ABNORMAL LOW (ref 3.5–5.0)
Alkaline Phosphatase: 414 U/L — ABNORMAL HIGH (ref 38–126)
Anion gap: 12 (ref 5–15)
BUN: 34 mg/dL — ABNORMAL HIGH (ref 8–23)
CO2: 19 mmol/L — ABNORMAL LOW (ref 22–32)
Calcium: 8.2 mg/dL — ABNORMAL LOW (ref 8.9–10.3)
Chloride: 107 mmol/L (ref 98–111)
Creatinine, Ser: 1.24 mg/dL — ABNORMAL HIGH (ref 0.44–1.00)
GFR calc Af Amer: 51 mL/min — ABNORMAL LOW (ref >60)
GFR calc non Af Amer: 44 mL/min — ABNORMAL LOW (ref >60)
Glucose, Bld: 185 mg/dL — ABNORMAL HIGH (ref 70–99)
Potassium: 3.3 mmol/L — ABNORMAL LOW (ref 3.5–5.1)
Sodium: 138 mmol/L (ref 135–145)
Total Bilirubin: 6.4 mg/dL — ABNORMAL HIGH (ref 0.3–1.2)
Total Protein: 5 g/dL — ABNORMAL LOW (ref 6.5–8.1)

## 2019-08-13 LAB — GLUCOSE, CAPILLARY
Glucose-Capillary: 167 mg/dL — ABNORMAL HIGH (ref 70–99)
Glucose-Capillary: 175 mg/dL — ABNORMAL HIGH (ref 70–99)

## 2019-08-13 LAB — HEMOGLOBIN AND HEMATOCRIT, BLOOD
HCT: 28.7 % — ABNORMAL LOW (ref 36.0–46.0)
Hemoglobin: 9 g/dL — ABNORMAL LOW (ref 12.0–15.0)

## 2019-08-13 LAB — SARS CORONAVIRUS 2 BY RT PCR (HOSPITAL ORDER, PERFORMED IN ~~LOC~~ HOSPITAL LAB): SARS Coronavirus 2: NEGATIVE

## 2019-08-13 NOTE — Discharge Summary (Addendum)
Physician Discharge Summary  Harmonee Tozer QIW:[id number] DOB: [birth year] DOA: 08/05/2019  PCP: [name], MD  Admit date: 08/05/2019 Discharge date: 08/13/2019  Admitted From: SNF Disposition:  SNF  Recommendations for Outpatient Follow-up:  1. Follow up with PCP in 1-2 weeks 2. Please obtain CMP/CBC in one week your next doctors visit.  3. IV dapotmycin until July 26th with weekly labs  4. Follow up outpatient ID and Gastroenterology. Follow up to be arranged by their services.  5. Check Blood glucose 4 times daily, adjust diabetic regimen accordingly.   Discharge Condition: Stable CODE STATUS: Full code Diet recommendation: Diabetic  Brief/Interim Summary: 69 y.o.femalewith history ofasthma, depression, insulin-dependent type 2 diabetes, gout, hypertension, hyperlipidemia, morbid obesity, schizophrenia, CVA presented from [facility] for evaluation of low hemoglobin (5.6),  abdominal pain,diarrhea and shortness of breath. Patient was recently admitted to the hospital 6/14-6/24 for sepsis secondary to MRSA and enterococcal bacteremia. Source felt to be secondary to UTI and infected sacral decubitus ulcer. Urine culture grew Klebsiella. No valvular vegetationson TTE. Infectious disease was consulted and recommended TEE which the patient refused. PICC line was placed on 6/22 and she was discharged with plan to continue IV vancomycin for total of 6 weeks until 7/26. Patient was sent here from [facility] because hemoglobin was low on lab work. She reported nonbloody diarrhea for 2 days. No nausea or vomiting. She had some mild lower abdominal discomfort earlier which has now resolved.  Patient admitted to [facility] for further evaluation and management of anemia.  Stool work-up was negative and diarrhea resolved now.  Hemoglobin has improved and been stable at 9.5 following 2 units of PRBC. GI consulted on 7/2, they had recommended MRCP and sent off hepatic serologies.   Recommended colonoscopy as outpatient. Work-up for elevated LFTs so far unremarkable including abdominal USG and MRCP other than GB stones versus sludge, hepatic steatosis/concern for early cirrhosis.  Total bilirubin has been trending up to 7.2, predominantly direct.  ALT115 >>130. INR stable at 1.2 .  Mildly elevated GGT at 190.  Hospital course further complicated by temp spike to 102.19F, lactic acidosis on the evening of 7/4 prompting repeat blood cultures and infectious work-up again with chest x-ray, UA while on antibiotics.  Later in the night on-call team was summoned for evaluation of rash/itching and concern for "red man" syndrome.  With recommendations of infectious disease, his antibiotics were transitioned to daptomycin, last day 7/26.  Otherwise follow-up outpatient with GI, PCP and infectious disease.   Acute on chronic normocytic anemia  Hgb 6.7 on presentation (was 8.4 at recent discharge.Previously ranging between 10-13). No grossly bloody stool seen on rectal exam and FOBT negative.Hemoglobin has steadily improved and been stable at 9.5-9.7 following 2 units of PRBC.  Seen by GI and recommended outpatient colonoscopy.    No evidence of active bleeding  Abdominal pain, diarrhea, elevated LFTs:  Diarrhea resolved before C. difficile testing LFTs improving, most of the autoimmune work-up is negative but rest will be followed up outpatient by GI.  GI will determine outpatient patient will still benefit from liver biopsy.  MRSA/Enterococcus bacteremia UTI and sacral decubitus likely being the source.   Patient refused TEE.  3 Repeat blood cultures on 6/18 showed no growth.  Initially discharged with PICC line in right upper extremity with 6 weeks of IV antibiotic-last day 7/26.  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  Last day 7/26.  Thereafter can remove PICC line.  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  Continue Lantus and sliding scale insulin.  Resume home regimen including glipizide upon discharge.  Hyperlipidemia: Statin on hold until LFTs improve  AKI:  Baseline creatinine 0.9, peaked at 1.7.  Today it is 1.2 and improving.  Recommend oral hydration and repeat lab work in about 1 week  Hypokalemia: Repletion ordered  Gout: Continue allopurinol.  Morbid obesity obesity:Estimated body mass index is 45.82 kg/m   Body mass index is 45.82 kg/m.  Pressure Injury 07/19/18 Buttocks Mid Stage II -  Partial thickness loss of dermis presenting as a shallow open ulcer with a red, pink wound bed without slough. (Active)  07/19/18 2115  Location: Buttocks  Location Orientation: Mid  Staging: Stage II -  Partial thickness loss of dermis presenting as a shallow open ulcer with a red, pink wound bed without slough.  Wound Description (Comments):   Present on Admission: Yes     Pressure Injury 07/22/19 Sacrum Stage 2 -  Partial thickness loss of dermis presenting as a shallow open injury with a red, pink wound bed without slough. (Active)  07/22/19   Location: Sacrum  Location Orientation:   Staging: Stage 2 -  Partial thickness loss of dermis presenting as a shallow open injury with a red, pink wound bed without slough.  Wound Description (Comments):   Present on Admission: Yes        Discharge Diagnoses:  Principal Problem:   Diarrhea Active Problems:   HTN (hypertension)   Acute kidney injury ([place])   Wound of sacral region   GERD (gastroesophageal reflux disease)   Morbid obesity with BMI of 50.0-59.9, adult (HCC)   Stage II pressure ulcer of sacral region ([place])   MRSA bacteremia   Vitiligo   Anemia   Splenomegaly      Consultations:  Infectious disease  GI  Subjective: Feels okay no  complaints  Discharge Exam: Vitals:   08/13/19 0817 08/13/19 1117  BP: (!) 142/56 137/65  Pulse: 60 62  Resp: 19 20  Temp: 98.3 F (36.8 C) 99 F (37.2 C)  SpO2: 97% 96%   Vitals:   08/13/19 0419 08/13/19 0500 08/13/19 0817 08/13/19 1117  BP: (!) 143/68  (!) 142/56 137/65  Pulse: 64 61 60 62  Resp: _0 Temp: 98.1 F (36.7 C)  98.3 F (36.8 C) 99 F (37.2 C)  TempSrc:   Oral Oral  SpO2: 95% 94% 97% 96%  Weight:      Height:        General: Pt is alert, awake, not in acute distress, chronically ill-appearing Cardiovascular: RRR, S1/S2 +, no rubs, no gallops Respiratory: CTA bilaterally, no wheezing, no rhonchi Abdominal: Soft, NT, ND, bowel sounds + Extremities: no edema, no cyanosis  Discharge Instructions  Discharge Instructions    Diet - low sodium heart healthy   Complete by: As directed    Discharge wound care:   Complete by: As directed    Routine   Home infusion instructions   Complete by: As directed    Instructions: Flushing of vascular access device: 0.9% NaCl pre/post medication administration and prn patency; Heparin 100 u/ml, 33m for implanted ports and Heparin 10u/ml, 515mfor all other central venous catheters.   Increase activity slowly   Complete by: As directed  Allergies as of 08/13/2019      Reactions   Lasix [furosemide] Other (See Comments)   IV-LASIX ==> Reaction: Burning      Medication List    STOP taking these medications   acetaminophen 325 MG tablet Commonly known as: TYLENOL   benazepril 20 MG tablet Commonly known as: LOTENSIN   colchicine 0.6 MG tablet   pravastatin 40 MG tablet Commonly known as: PRAVACHOL   vancomycin  IVPB     TAKE these medications   allopurinol 300 MG tablet Commonly known as: ZYLOPRIM Take 300 mg by mouth daily.   amLODipine 10 MG tablet Commonly known as: NORVASC Take 10 mg by mouth daily.   b complex vitamins tablet Take 1 tablet by mouth daily.   bisacodyl 5 MG EC  tablet Commonly known as: DULCOLAX Take 1 tablet (5 mg total) by mouth daily as needed for moderate constipation.   collagenase ointment Commonly known as: SANTYL Apply topically daily.   daptomycin  IVPB Commonly known as: CUBICIN Inject 600 mg into the vein daily for 20 days. Indication:  MRSA/Enterococcus bacteremia First Dose: Yes Last Day of Therapy:  09/01/2019 Labs - Once weekly:  CBC/D, BMP, and CPK Labs - Every other week:  ESR and CRP Method of administration: IV Push Method of administration may be changed at the discretion of home infusion pharmacist based upon assessment of the patient and/or caregiver's ability to self-administer the medication ordered.   diphenhydrAMINE 2 % cream Commonly known as: BENADRYL Apply 1 application topically 3 (three) times daily as needed for itching.   doxepin 25 MG capsule Commonly known as: SINEQUAN Take 25 mg by mouth at bedtime.   Dymista 137-50 MCG/ACT Susp Generic drug: Azelastine-Fluticasone Place 1 spray into both nostrils 2 (two) times daily as needed (for allergies).   Eucerin Eczema Relief 1 % Crea Generic drug: Colloidal Oatmeal Apply 1 application topically 2 (two) times daily.   famotidine 20 MG tablet Commonly known as: PEPCID Take 20 mg by mouth 2 (two) times daily.   folic acid 1 MG tablet Commonly known as: FOLVITE Take 1 mg by mouth daily.   glipiZIDE 10 MG 24 hr tablet Commonly known as: GLUCOTROL XL Take 10 mg by mouth 2 (two) times daily.   hydrocortisone 2.5 % cream Apply 1 application topically See admin instructions. Apply topically to rash on face twice daily   hydrOXYzine 25 MG tablet Commonly known as: ATARAX/VISTARIL Take 25 mg by mouth 2 (two) times daily.   insulin aspart 100 UNIT/ML injection Commonly known as: NovoLOG Inject 0-12 Units into the skin See admin instructions. Inject 0-12 units into the skin 4 times a day (before meals and at bedtime) per sliding scale: BGL <60 or >400  =  CALL MD; 60-200 = 0 units; 201-250 = 2 units; 251-300 = 4 units; 301-350 = 6 units; 351-400 = 8 units; 401-450 = 10 units; >450 = 12 units   insulin glargine 100 UNIT/ML injection Commonly known as: LANTUS Inject 0.25 mLs (25 Units total) into the skin at bedtime.   ipratropium-albuterol 0.5-2.5 (3) MG/3ML Soln Commonly known as: DUONEB Take 3 mLs by nebulization every 6 (six) hours as needed (for shortness of breath or wheezing).   nadolol 20 MG tablet Commonly known as: CORGARD Take 1 tablet (20 mg total) by mouth daily.   nystatin powder Commonly known as: MYCOSTATIN/NYSTOP Apply 1 g topically See admin instructions. Apply topically twice daily as needed for wound care  -  between  folds, beneath breasts, bilateral axillae, and under pannus   polyethylene glycol 17 g packet Commonly known as: MIRALAX / GLYCOLAX Take 17 g by mouth daily as needed for mild constipation.   risperiDONE 0.5 MG tablet Commonly known as: RISPERDAL Take 1 tablet (0.5 mg total) by mouth 2 (two) times daily.   THERA-M MULTIPLE VITAMINS PO Take 1 tablet by mouth daily.   Ventolin HFA 108 (90 Base) MCG/ACT inhaler Generic drug: albuterol Inhale 2 puffs into the lungs every 4 (four) hours as needed for wheezing or shortness of breath.   vitamin C 500 MG tablet Commonly known as: ASCORBIC ACID Take 500 mg by mouth 2 (two) times daily.   Vitamin D (Ergocalciferol) 1.25 MG (50000 UNIT) Caps capsule Commonly known as: DRISDOL Take 50,000 Units by mouth every Monday.   ZINC-220 PO Take 220 mg by mouth at bedtime.            Home Infusion Instuctions  (From admission, onward)         Start     Ordered   08/12/19 0000  Home infusion instructions       Question:  Instructions  Answer:  Flushing of vascular access device: 0.9% NaCl pre/post medication administration and prn patency; Heparin 100 u/ml, 68m for implanted ports and Heparin 10u/ml, 550mfor all other central venous catheters.    08/12/19 1542           Discharge Care Instructions  (From admission, onward)         Start     Ordered   08/08/19 0000  Discharge wound care:       Comments: Routine   08/08/19 0920          Contact information for follow-up providers    AvNolene EbbsMD Follow up in 2 week(s).   Specialty: Internal Medicine Contact information: [phone]            Contact information for after-discharge care    Destination    [facility] .   Service: Skilled Nursing Contact information: [phone]               Allergies  Allergen Reactions  . Lasix [Furosemide] Other (See Comments)    IV-LASIX ==> Reaction: Burning    You were cared for by a hospitalist during your hospital stay. If you have any questions about your discharge medications or the care you received while you were in the hospital after you are discharged, you can call the unit and asked to speak with the hospitalist on call if the hospitalist that took care of you is not available. Once you are discharged, your primary care physician will handle any further medical issues. Please note that no refills for any discharge medications will be authorized once you are discharged, as it is imperative that you return to your primary care physician (or establish a relationship with a primary care physician if you do not have one) for your aftercare needs so that they can reassess your need for medications and monitor your lab values.   Procedures/Studies: CT ABDOMEN PELVIS WO CONTRAST  Addendum Date: 07/22/2019   ADDENDUM REPORT: 07/22/2019 00:43 ADDENDUM: Along the posterior left subcutaneous soft tissues overlying the iliac there is a focal area of ulceration and fat stranding changes. No loculated fluid collections or subcutaneous emphysema. No cortical destruction or bony erosion however  seen at  the posterior iliac. Electronically Signed   By: Prudencio Pair M.D.   On: 07/22/2019 00:43   Result Date: 07/22/2019 CLINICAL DATA:  Fever of unknown origin EXAM: CT CHEST, abdomen, and pelvis WITHOUT CONTRAST TECHNIQUE: Multidetector CT imaging of the chest, abdomen and pelvis was performed following the standard protocol without IV contrast. COMPARISON:  July 19, 2018 FINDINGS: Cardiovascular: Scattered aortic atherosclerosis is noted. Coronary artery calcifications are seen. The heart size is normal. There is no pericardial thickening or effusion. Mediastinum/Nodes: There are no enlarged mediastinal, hilar or axillary lymph nodes. The thyroid gland, trachea and esophagus demonstrate no significant findings. Lungs/Pleura: Minimal streaky atelectasis seen at both lung bases. Small amount of tree-in-bud opacity seen at the posterior periphery of the right lung base as on the prior exam. There is a small 5 mm pulmonary nodule at the right lung base which was partially visualized on prior exam. No pleural effusion Upper abdomen: The visualized portion of the upper abdomen is unremarkable. Musculoskeletal/Chest wall: There is no chest wall mass or suspicious osseous finding. 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 Abdomen/pelvis: Hepatobiliary: There is diffuse low density seen throughout the liver parenchyma. No focal hepatic lesion although limited due to the lack of intravenous contrast. Layering hyperdense sludge/small stones are noted. Ill intrahepatic biliary ductal dilatation. Pancreas: Unremarkable.  No surrounding inflammatory changes. Spleen: Normal in size. Although limited due to the lack of intravenous contrast, normal in appearance. Adrenals/Urinary Tract: Both adrenal glands appear normal. The kidneys and collecting system appear normal without evidence of urinary tract calculus or hydronephrosis.  Bladder is unremarkable. Stomach/Bowel: The stomach, small bowel, and colon are normal in appearance. No inflammatory changes or obstructive findings. Diverticulosis without diverticulitis is seen. The appendix is unremarkable. Vascular/Lymphatic: There are no enlarged abdominal or pelvic lymph nodes. Scattered aortic atherosclerotic calcifications are seen without aneurysmal dilatation. Reproductive: The uterus and adnexa are unremarkable. Other: No evidence of abdominal wall mass or hernia. Musculoskeletal: No acute or significant osseous findings. IMPRESSION: 1. No definite acute intrathoracic, abdominal, or pelvic pathology to explain the patient's symptoms. 2. Unchanged minimal tree-in-bud opacities at the posterior periphery of the right lung base, likely due to chronic inflammatory process. 3.  Cholelithiasis without evidence of acute cholecystitis. 4. 5 mm pulmonary nodule at the right lung base. No follow-up recommended. This recommendation follows the consensus statement: Guidelines for Management of Incidental Pulmonary Nodules Detected on CT Images: From the Fleischner Society 2017; Radiology 2017; 284:228-243. 5.  Aortic Atherosclerosis (ICD10-I70.0). Electronically Signed: By: Prudencio Pair M.D. On: 07/22/2019 00:12   DG Chest 2 View  Result Date: 07/21/2019 CLINICAL DATA:  Suspected sepsis. EXAM: CHEST - 2 VIEW COMPARISON:  Radiograph 07/19/2018 FINDINGS: Low lung volumes. Mild cardiomegaly. Aortic atherosclerosis. Peribronchial thickening. There are streaky bibasilar opacities. No confluent airspace disease. No significant pleural effusion. No pneumothorax. No acute osseous abnormalities are seen. IMPRESSION: 1. Low lung volumes with streaky bibasilar opacities, favoring atelectasis. 2. Peribronchial thickening. 3. Mild cardiomegaly. Electronically Signed   By: Keith Rake M.D.   On: 07/21/2019 22:46   DG Shoulder Right  Result Date: 07/24/2019 CLINICAL DATA:  Unwitnessed fall EXAM: RIGHT  SHOULDER - 2+ VIEW COMPARISON:  None. FINDINGS: Degenerative changes in the right [facility] and glenohumeral joints with joint space narrowing and spurring. No acute bony abnormality. Specifically, no fracture, subluxation, or dislocation. IMPRESSION: Degenerative changes.  No acute bony abnormality. Electronically Signed   By: Lennette Bihari  [place] M.D.   On: 07/24/2019 20:55   CT Head Wo Contrast  Result Date: 07/21/2019 CLINICAL DATA:  Altered mental status. EXAM: CT HEAD WITHOUT CONTRAST TECHNIQUE: Contiguous axial images were obtained from the base of the skull through the vertex without intravenous contrast. COMPARISON:  Head CT 05/07/2018 FINDINGS: Brain: No intracranial hemorrhage, mass effect, or midline shift. Normal brain volume for age. No hydrocephalus. The basilar cisterns are patent. Mild chronic small vessel ischemia. No evidence of territorial infarct or acute ischemia. No extra-axial or intracranial fluid collection. Vascular: Atherosclerosis of skullbase vasculature without hyperdense vessel or abnormal calcification. Skull: No fracture or focal lesion. Sinuses/Orbits: Paranasal sinuses and mastoid air cells are clear. The visualized orbits are unremarkable. Other: None. IMPRESSION: 1. No acute intracranial abnormality. 2. Mild chronic small vessel ischemia. Electronically Signed   By: Keith Rake M.D.   On: 07/21/2019 22:59   CT Chest Wo Contrast  Addendum Date: 07/22/2019   ADDENDUM REPORT: 07/22/2019 00:43 ADDENDUM: Along the posterior left subcutaneous soft tissues overlying the iliac there is a focal area of ulceration and fat stranding changes. No loculated fluid collections or subcutaneous emphysema. No cortical destruction or bony erosion however seen at the posterior iliac. Electronically Signed   By: Prudencio Pair M.D.   On: 07/22/2019 00:43   Result Date: 07/22/2019 CLINICAL DATA:  Fever of unknown origin EXAM: CT CHEST, abdomen, and pelvis WITHOUT CONTRAST TECHNIQUE: Multidetector CT  imaging of the chest, abdomen and pelvis was performed following the standard protocol without IV contrast. COMPARISON:  July 19, 2018 FINDINGS: Cardiovascular: Scattered aortic atherosclerosis is noted. Coronary artery calcifications are seen. The heart size is normal. There is no pericardial thickening or effusion. Mediastinum/Nodes: There are no enlarged mediastinal, hilar or axillary lymph nodes. The thyroid gland, trachea and esophagus demonstrate no significant findings. Lungs/Pleura: Minimal streaky atelectasis seen at both lung bases. Small amount of tree-in-bud opacity seen at the posterior periphery of the right lung base as on the prior exam. There is a small 5 mm pulmonary nodule at the right lung base which was partially visualized on prior exam. No pleural effusion Upper abdomen: The visualized portion of the upper abdomen is unremarkable. Musculoskeletal/Chest wall: There is no chest wall mass or suspicious osseous finding. 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 Abdomen/pelvis: Hepatobiliary: There is diffuse low density seen throughout the liver parenchyma. No focal hepatic lesion although limited due to the lack of intravenous contrast. Layering hyperdense sludge/small stones are noted. Ill intrahepatic biliary ductal dilatation. Pancreas: Unremarkable.  No surrounding inflammatory changes. Spleen: Normal in size. Although limited due to the lack of intravenous contrast, normal in appearance. Adrenals/Urinary Tract: Both adrenal glands appear normal. The kidneys and collecting system appear normal without evidence of urinary tract calculus or hydronephrosis. Bladder is unremarkable. Stomach/Bowel: The stomach, small bowel, and colon are normal in appearance. No inflammatory changes or obstructive findings. Diverticulosis without diverticulitis is seen. The appendix is unremarkable. Vascular/Lymphatic:  There are no enlarged abdominal or pelvic lymph nodes. Scattered aortic atherosclerotic calcifications are seen without aneurysmal dilatation. Reproductive: The uterus and adnexa are unremarkable. Other: No evidence of abdominal wall mass or hernia. Musculoskeletal: No acute or significant osseous findings. IMPRESSION: 1. No definite acute intrathoracic, abdominal, or pelvic pathology to explain the patient's symptoms. 2. Unchanged minimal tree-in-bud opacities at the posterior periphery of the right lung base, likely due to chronic inflammatory process. 3.  Cholelithiasis without evidence  of acute cholecystitis. 4. 5 mm pulmonary nodule at the right lung base. No follow-up recommended. This recommendation follows the consensus statement: Guidelines for Management of Incidental Pulmonary Nodules Detected on CT Images: From the Fleischner Society 2017; Radiology 2017; [id number]. 5.  Aortic Atherosclerosis (ICD10-I70.0). Electronically Signed: By: Prudencio Pair M.D. On: 07/22/2019 00:12   MR ABDOMEN MRCP WO CONTRAST  Result Date: 08/09/2019 CLINICAL DATA:  69 year old female with history of steatohepatitis. EXAM: MRI ABDOMEN WITHOUT CONTRAST  (INCLUDING MRCP) TECHNIQUE: Multiplanar multisequence MR imaging of the abdomen was performed. Heavily T2-weighted images of the biliary and pancreatic ducts were obtained, and three-dimensional MRCP images were rendered by post processing. COMPARISON:  No priors. FINDINGS: Comment: Today's study is limited for detection and characterization of visceral and/or vascular lesions by lack of IV gadolinium. Lower chest: Small right and trace left pleural effusions lying dependently. Hepatobiliary: Mild diffuse loss of signal intensity throughout the hepatic parenchyma on out of phase dual echo images, indicative of hepatic steatosis. No suspicious hepatic lesions confidently identified on today's noncontrast examination. No intra or extrahepatic biliary ductal dilatation. No  filling defects in the common bile duct to suggest choledocholithiasis. Common bile duct measures 6 mm in the porta hepatis on MRCP images. Tiny filling defects lying dependently in the gallbladder which may reflect a small amount of biliary sludge and/or tiny gallstones. Gallbladder is only moderately distended. No gallbladder wall thickening or pericholecystic fluid. Pancreas: No definite pancreatic mass or peripancreatic fluid collections or inflammatory changes noted on today's noncontrast examination. No pancreatic ductal dilatation noted on MRCP images. Spleen: Spleen is enlarged measuring 10.6 x 6.8 x 11.9 cm (estimated splenic volume of 429 mL). Adrenals/Urinary Tract: Unenhanced appearance of the kidneys and bilateral adrenal glands is unremarkable. No hydroureteronephrosis in the visualized portions of the abdomen. Stomach/Bowel: Visualized portions are unremarkable. Vascular/Lymphatic: No aneurysm identified in the visualized abdominal vasculature. No lymphadenopathy noted in the abdomen. Other:  Trace volume of perihepatic ascites. Musculoskeletal: No aggressive appearing osseous lesions are noted in the visualized portions of the skeleton. IMPRESSION: 1. Hepatic steatosis. 2. Trace volume of perihepatic ascites. 3. Mild splenomegaly. 4. Small right and trace left pleural effusions lying dependently. Electronically Signed   By: Vinnie Langton M.D.   On: 08/09/2019 08:06   DG CHEST PORT 1 VIEW  Result Date: 08/10/2019 CLINICAL DATA:  Fever and shortness of breath EXAM: PORTABLE CHEST 1 VIEW COMPARISON:  08/05/2019 and 07/23/2019 FINDINGS: Right-sided PICC line unchanged with tip over the SVC. Lungs are adequately inflated with stable linear scarring over the right midlung. No lobar consolidation or effusion. Cardiomediastinal silhouette and remainder of the exam is unchanged. IMPRESSION: No acute cardiopulmonary disease. Stable linear scarring right midlung. Electronically Signed   By: Marin Olp  M.D.   On: 08/10/2019 16:16   DG Chest Port 1 View  Result Date: 08/05/2019 CLINICAL DATA:  Shortness of breath EXAM: PORTABLE CHEST 1 VIEW COMPARISON:  07/23/2019 FINDINGS: No focal opacity or pleural effusion. Stable cardiomediastinal silhouette with aortic atherosclerosis. No pneumothorax. Right upper extremity central venous catheter with tip over the SVC. IMPRESSION: No active disease. Electronically Signed   By: Donavan Foil M.D.   On: 08/05/2019 20:28   DG CHEST PORT 1 VIEW  Result Date: 07/23/2019 CLINICAL DATA:  Tachypnea EXAM: PORTABLE CHEST 1 VIEW COMPARISON:  07/21/2019 FINDINGS: The heart size and mediastinal contours are within normal limits. Aortic atherosclerosis. Both lungs are clear. The visualized skeletal structures are unremarkable. IMPRESSION: No active disease. Electronically Signed  By: Donavan Foil M.D.   On: 07/23/2019 19:39   DG Humerus Right  Result Date: 07/24/2019 CLINICAL DATA:  Unwitnessed fall EXAM: RIGHT HUMERUS - 2+ VIEW COMPARISON:  Shoulder series today FINDINGS: Degenerative changes in the right AC and glenohumeral joints. No acute bony abnormality. Specifically, no fracture, subluxation, or dislocation. IMPRESSION: No acute bony abnormality. Electronically Signed   By: Rolm Baptise M.D.   On: 07/24/2019 20:56   ECHOCARDIOGRAM COMPLETE  Result Date: 07/23/2019    ECHOCARDIOGRAM REPORT   Patient Name:   SHANTAYA BLUESTONE Date of Exam: 07/23/2019 Medical Rec #:  [id number]        Height:       61.0 in Accession #:    [id number]       Weight:       242.5 lb Date of Birth:  Jan 31, 1951        BSA:          2.050 m Patient Age:    69 years         BP:           135/56 mmHg Patient Gender: F                HR:           84 bpm. Exam Location:  Inpatient Procedure: 2D Echo, Cardiac Doppler and Color Doppler Indications:    Bacteremia  History:        Patient has prior history of Echocardiogram examinations, most                 recent 07/20/2018. Risk Factors:Diabetes,  Hypertension,                 Dyslipidemia and Former Smoker. MRSA bacteremia.  Sonographer:    Clayton Lefort RDCS (AE) Referring Phys: [place]  1. Left ventricular ejection fraction, by estimation, is 65 to 70%. The left ventricle has normal function. The left ventricle has no regional wall motion abnormalities. There is mild concentric left ventricular hypertrophy. Left ventricular diastolic parameters are consistent with Grade I diastolic dysfunction (impaired relaxation).  2. Right ventricular systolic function is normal. The right ventricular size is normal. There is normal pulmonary artery systolic pressure. The estimated right ventricular systolic pressure is 89.1 mmHg.  3. The mitral valve is normal in structure. No evidence of mitral valve regurgitation. No evidence of mitral stenosis.  4. The aortic valve has an indeterminant number of cusps. Aortic valve regurgitation is not visualized. Mild to moderate aortic valve sclerosis/calcification is present, without any evidence of aortic stenosis.  5. The inferior vena cava is normal in size with greater than 50% respiratory variability, suggesting right atrial pressure of 3 mmHg. Conclusion(s)/Recommendation(s): No evidence of valvular vegetations on this transthoracic echocardiogram. Would recommend a transesophageal echocardiogram to exclude infective endocarditis if clinically indicated. FINDINGS  Left Ventricle: Left ventricular ejection fraction, by estimation, is 65 to 70%. The left ventricle has normal function. The left ventricle has no regional wall motion abnormalities. The left ventricular internal cavity size was normal in size. There is  mild concentric left ventricular hypertrophy. Left ventricular diastolic parameters are consistent with Grade I diastolic dysfunction (impaired relaxation). Normal left ventricular filling pressure. Right Ventricle: The right ventricular size is normal. No increase in right ventricular wall  thickness. Right ventricular systolic function is normal. There is normal pulmonary artery systolic pressure. The tricuspid regurgitant velocity is 2.48 m/s, and  with an assumed right atrial pressure of  3 mmHg, the estimated right ventricular systolic pressure is 03.7 mmHg. Left Atrium: Left atrial size was normal in size. Right Atrium: Right atrial size was normal in size. Pericardium: There is no evidence of pericardial effusion. Mitral Valve: The mitral valve is normal in structure. Normal mobility of the mitral valve leaflets. Mild mitral annular calcification. No evidence of mitral valve regurgitation. No evidence of mitral valve stenosis. MV peak gradient, 3.5 mmHg. The mean mitral valve gradient is 2.0 mmHg. Tricuspid Valve: The tricuspid valve is normal in structure. Tricuspid valve regurgitation is trivial. No evidence of tricuspid stenosis. Aortic Valve: The aortic valve has an indeterminant number of cusps. Aortic valve regurgitation is not visualized. Mild to moderate aortic valve sclerosis/calcification is present, without any evidence of aortic stenosis. Aortic valve mean gradient measures 5.0 mmHg. Aortic valve peak gradient measures 12.7 mmHg. Aortic valve area, by VTI measures 2.20 cm. Pulmonic Valve: The pulmonic valve was normal in structure. Pulmonic valve regurgitation is not visualized. No evidence of pulmonic stenosis. Aorta: The aortic root is normal in size and structure. Venous: The inferior vena cava is normal in size with greater than 50% respiratory variability, suggesting right atrial pressure of 3 mmHg. IAS/Shunts: The interatrial septum appears to be lipomatous. No atrial level shunt detected by color flow Doppler.  LEFT VENTRICLE PLAX 2D LVIDd:         3.50 cm  Diastology LVIDs:         2.20 cm  LV e' lateral:   8.38 cm/s LV PW:         1.20 cm  LV E/e' lateral: 9.9 LV IVS:        1.10 cm  LV e' medial:    7.94 cm/s LVOT diam:     1.70 cm  LV E/e' medial:  10.4 LV SV:         64 LV  SV Index:   31 LVOT Area:     2.27 cm  RIGHT VENTRICLE             IVC RV Basal diam:  3.00 cm     IVC diam: 1.50 cm RV S prime:     11.00 cm/s TAPSE (M-mode): 1.6 cm LEFT ATRIUM             Index       RIGHT ATRIUM           Index LA diam:        2.40 cm 1.17 cm/m  RA Area:     11.10 cm LA Vol (A2C):   54.7 ml 26.68 ml/m RA Volume:   22.60 ml  11.02 ml/m LA Vol (A4C):   36.9 ml 18.00 ml/m LA Biplane Vol: 45.4 ml 22.15 ml/m  AORTIC VALVE AV Area (Vmax):    2.10 cm AV Area (Vmean):   2.23 cm AV Area (VTI):     2.20 cm AV Vmax:           178.00 cm/s AV Vmean:          109.000 cm/s AV VTI:            0.293 m AV Peak Grad:      12.7 mmHg AV Mean Grad:      5.0 mmHg LVOT Vmax:         165.00 cm/s LVOT Vmean:        107.000 cm/s LVOT VTI:          0.284 m LVOT/AV VTI ratio: 0.97  AORTA Ao Root diam: 2.60 cm Ao Asc diam:  3.20 cm MITRAL VALVE                TRICUSPID VALVE MV Area (PHT): 2.69 cm     TR Peak grad:   24.6 mmHg MV Peak grad:  3.5 mmHg     TR Vmax:        248.00 cm/s MV Mean grad:  2.0 mmHg MV Vmax:       0.94 m/s     SHUNTS MV Vmean:      58.4 cm/s    Systemic VTI:  0.28 m MV Decel Time: 282 msec     Systemic Diam: 1.70 cm MV E velocity: 82.60 cm/s MV A velocity: 102.00 cm/s MV E/A ratio:  0.81 Fransico Him MD Electronically signed by Fransico Him MD Signature Date/Time: 07/23/2019/4:37:10 PM    Final    [place] EKG SITE RITE  Result Date: 07/28/2019 If Site Rite image not attached, placement could not be confirmed due to current cardiac rhythm.  US Abdomen Limited RUQ  Result Date: 08/05/2019 CLINICAL DATA:  Elevated LFTs EXAM: ULTRASOUND ABDOMEN LIMITED RIGHT UPPER QUADRANT COMPARISON:  CT dated July 21, 2019 FINDINGS: Gallbladder: No gallstones or wall thickening visualized. No sonographic Murphy sign noted by sonographer. Common bile duct: Diameter: 6 mm Liver: Diffuse increased echogenicity with slightly heterogeneous liver. Appearance typically secondary to fatty infiltration. Fibrosis  secondary consideration. No secondary findings of cirrhosis noted. No focal hepatic lesion or intrahepatic biliary duct dilatation. Portal vein is patent on color Doppler imaging with normal direction of blood flow towards the liver. Other: None. IMPRESSION: 1. No acute abnormality. 2. Hepatic steatosis. Electronically Signed   By: Constance Holster M.D.   On: 08/05/2019 22:30     The results of significant diagnostics from this hospitalization (including imaging, microbiology, ancillary and laboratory) are listed below for reference.     Microbiology: Recent Results (from the past 240 hour(s))  SARS Coronavirus 2 by RT PCR (hospital order, performed in [facility] hospital lab) Nasopharyngeal Nasopharyngeal Swab     Status: None   Collection Time: 08/05/19  7:50 PM   Specimen: Nasopharyngeal Swab  Result Value Ref Range Status   SARS Coronavirus 2 NEGATIVE NEGATIVE Final    Comment: (NOTE) SARS-CoV-2 target nucleic acids are NOT DETECTED.  The SARS-CoV-2 RNA is generally detectable in upper and lower respiratory specimens during the acute phase of infection. The lowest concentration of SARS-CoV-2 viral copies this assay can detect is 250 copies / mL. A negative result does not preclude SARS-CoV-2 infection and should not be used as the sole basis for treatment or other patient management decisions.  A negative result may occur with improper specimen collection / handling, submission of specimen other than nasopharyngeal swab, presence of viral mutation(s) within the areas targeted by this assay, and inadequate number of viral copies (<250 copies / mL). A negative result must be combined with clinical observations, patient history, and epidemiological information.  Fact Sheet for Patients:   StrictlyIdeas.no  Fact Sheet for Healthcare Providers: BankingDealers.co.za  This test is not yet approved or  cleared by the [place] FDA  and has been authorized for detection and/or diagnosis of SARS-CoV-2 by FDA under an Emergency Use Authorization (EUA).  This EUA will remain in effect (meaning this test can be used) for the duration of the COVID-19 declaration under Section 564(b)(1) of the Act, 21 U.S.C. section 360bbb-3(b)(1), unless the authorization is terminated or revoked sooner.  Performed at [facility], [place] [address], [place], [place] [address]   Culture, blood (routine x 2)     Status: None (Preliminary result)   Collection Time: 08/10/19  4:48 PM   Specimen: BLOOD LEFT HAND  Result Value Ref Range Status   Specimen Description BLOOD LEFT HAND  Final   Special Requests   Final    BOTTLES DRAWN AEROBIC AND ANAEROBIC Blood Culture adequate volume   Culture   Final    NO GROWTH 3 DAYS Performed at [facility], [place] [address], [place], [place] [address]    Report Status PENDING  Incomplete  Culture, blood (routine x 2)     Status: None (Preliminary result)   Collection Time: 08/10/19  4:49 PM   Specimen: BLOOD RIGHT HAND  Result Value Ref Range Status   Specimen Description BLOOD RIGHT HAND  Final   Special Requests   Final    BOTTLES DRAWN AEROBIC AND ANAEROBIC Blood Culture adequate volume   Culture   Final    NO GROWTH 3 DAYS Performed at [facility], [place] [address] [place], [place] [address]    Report Status PENDING  Incomplete  SARS Coronavirus 2 by RT PCR (hospital order, performed in [facility] hospital lab) Nasopharyngeal Nasopharyngeal Swab     Status: None   Collection Time: 08/13/19 11:08 AM   Specimen: Nasopharyngeal Swab  Result Value Ref Range Status   SARS Coronavirus 2 NEGATIVE NEGATIVE Final    Comment: (NOTE) SARS-CoV-2 target nucleic acids are NOT DETECTED.  The SARS-CoV-2 RNA is generally detectable in upper and lower respiratory specimens during the acute phase of infection. The lowest concentration of SARS-CoV-2 viral copies this assay can detect is  250 copies / mL. A negative result does not preclude SARS-CoV-2 infection and should not be used as the sole basis for treatment or other patient management decisions.  A negative result may occur with improper specimen collection / handling, submission of specimen other than nasopharyngeal swab, presence of viral mutation(s) within the areas targeted by this assay, and inadequate number of viral copies (<250 copies / mL). A negative result must be combined with clinical observations, patient history, and epidemiological information.  Fact Sheet for Patients:   StrictlyIdeas.no  Fact Sheet for Healthcare Providers: BankingDealers.co.za  This test is not yet approved or  cleared by the [place] FDA and has been authorized for detection and/or diagnosis of SARS-CoV-2 by FDA under an Emergency Use Authorization (EUA).  This EUA will remain in effect (meaning this test can be used) for the duration of the COVID-19 declaration under Section 564(b)(1) of the Act, 21 U.S.C. section 360bbb-3(b)(1), unless the authorization is terminated or revoked sooner.  Performed at [facility], [place] [address], [place], [place] [address]      Labs: BNP (last 3 results) No results for input(s): BNP in the last 8760 hours. Basic Metabolic Panel: Recent Labs  Lab 08/09/19 0345 08/10/19 0425 08/11/19 0520 08/12/19 0451 08/13/19 0442  NA 141 137 132* 132* 138  K 3.7 3.6 3.3* 3.8 3.3*  CL 110 105 101 100 107  CO2 23 24 20* 20* 19*  GLUCOSE 63* 101* 205* 246* 185*  BUN _0 32* 34*  CREATININE 1.27* 1.26* 1.54* 1.70* 1.24*  CALCIUM 8.7* 8.3* 7.9* 8.2* 8.2*   Liver Function Tests: Recent Labs  Lab 08/09/19 0345 08/09/19 0345 08/10/19 0425 08/10/19 1220 08/11/19 0520 08/12/19 0451 08/13/19 0442  AST  111*  --  95*  --  205* 143* 105*  ALT 115*  --  130*  --  243* 254* 206*  ALKPHOS 479*  --  420*  --  363* 315* 414*  BILITOT  5.9*   < > 7.2* 7.8* 7.1* 6.3* 6.4*  PROT 5.7*  --  5.6*  --  5.4* 5.2* 5.0*  ALBUMIN 2.5*  --  2.5*  --  2.3* 2.3* 2.3*   < > = values in this interval not displayed.   No results for input(s): LIPASE, AMYLASE in the last 168 hours. No results for input(s): AMMONIA in the last 168 hours. CBC: Recent Labs  Lab 08/08/19 0450 08/08/19 0450 08/09/19 0345 08/10/19 0722 08/11/19 0520 08/12/19 0451 08/13/19 0442  WBC 4.8  --  4.8 4.4 4.6 3.6*  --   HGB 8.6*   < > 9.0* 9.5* 9.7* 8.8* 9.0*  HCT 26.7*   < > 29.0* 30.1* 30.4* 28.5* 28.7*  MCV 94.7  --  94.8 95.9 93.8 94.1  --   PLT 196  --  193 180 195 167  --    < > = values in this interval not displayed.   Cardiac Enzymes: Recent Labs  Lab 08/12/19 0451  CKTOTAL 16*   BNP: Invalid input(s): POCBNP CBG: Recent Labs  Lab 08/12/19 1307 08/12/19 1645 08/12/19 2217 08/13/19 0626 08/13/19 1136  GLUCAP 316* 276* 214* 167* 175*   D-Dimer No results for input(s): DDIMER in the last 72 hours. Hgb A1c No results for input(s): HGBA1C in the last 72 hours. Lipid Profile No results for input(s): CHOL, HDL, LDLCALC, TRIG, CHOLHDL, LDLDIRECT in the last 72 hours. Thyroid function studies No results for input(s): TSH, T4TOTAL, T3FREE, THYROIDAB in the last 72 hours.  Invalid input(s): FREET3 Anemia work up No results for input(s): VITAMINB12, FOLATE, FERRITIN, TIBC, IRON, RETICCTPCT in the last 72 hours. Urinalysis    Component Value Date/Time   COLORURINE AMBER (A) 08/10/2019 1601   APPEARANCEUR HAZY (A) 08/10/2019 1601   LABSPEC 1.012 08/10/2019 1601   PHURINE 5.0 08/10/2019 1601   GLUCOSEU NEGATIVE 08/10/2019 1601   HGBUR NEGATIVE 08/10/2019 1601   BILIRUBINUR NEGATIVE 08/10/2019 1601   KETONESUR 5 (A) 08/10/2019 1601   PROTEINUR NEGATIVE 08/10/2019 1601   UROBILINOGEN 0.2 12/27/2010 1858   NITRITE POSITIVE (A) 08/10/2019 1601   LEUKOCYTESUR SMALL (A) 08/10/2019 1601   Sepsis Labs Invalid input(s): PROCALCITONIN,   WBC,  LACTICIDVEN Microbiology Recent Results (from the past 240 hour(s))  SARS Coronavirus 2 by RT PCR (hospital order, performed in [place] hospital lab) Nasopharyngeal Nasopharyngeal Swab     Status: None   Collection Time: 08/05/19  7:50 PM   Specimen: Nasopharyngeal Swab  Result Value Ref Range Status   SARS Coronavirus 2 NEGATIVE NEGATIVE Final    Comment: (NOTE) SARS-CoV-2 target nucleic acids are NOT DETECTED.  The SARS-CoV-2 RNA is generally detectable in upper and lower respiratory specimens during the acute phase of infection. The lowest concentration of SARS-CoV-2 viral copies this assay can detect is 250 copies / mL. A negative result does not preclude SARS-CoV-2 infection and should not be used as the sole basis for treatment or other patient management decisions.  A negative result may occur with improper specimen collection / handling, submission of specimen other than nasopharyngeal swab, presence of viral mutation(s) within the areas targeted by this assay, and inadequate number of viral copies (<250 copies / mL). A negative result must be combined with clinical observations, patient  history, and epidemiological information.  Fact Sheet for Patients:   StrictlyIdeas.no  Fact Sheet for Healthcare Providers: BankingDealers.co.za  This test is not yet approved or  cleared by the [place] FDA and has been authorized for detection and/or diagnosis of SARS-CoV-2 by FDA under an Emergency Use Authorization (EUA).  This EUA will remain in effect (meaning this test can be used) for the duration of the COVID-19 declaration under Section 564(b)(1) of the Act, 21 U.S.C. section 360bbb-3(b)(1), unless the authorization is terminated or revoked sooner.  Performed at [facility], [place] [address], [place], [place] [address]   Culture, blood (routine x 2)     Status: None (Preliminary result)   Collection Time:  08/10/19  4:48 PM   Specimen: BLOOD LEFT HAND  Result Value Ref Range Status   Specimen Description BLOOD LEFT HAND  Final   Special Requests   Final    BOTTLES DRAWN AEROBIC AND ANAEROBIC Blood Culture adequate volume   Culture   Final    NO GROWTH 3 DAYS Performed at [facility], [place] [address] [place], [place] [address]    Report Status PENDING  Incomplete  Culture, blood (routine x 2)     Status: None (Preliminary result)   Collection Time: 08/10/19  4:49 PM   Specimen: BLOOD RIGHT HAND  Result Value Ref Range Status   Specimen Description BLOOD RIGHT HAND  Final   Special Requests   Final    BOTTLES DRAWN AEROBIC AND ANAEROBIC Blood Culture adequate volume   Culture   Final    NO GROWTH 3 DAYS Performed at [facility], [place] [address], [place], [place] [address]    Report Status PENDING  Incomplete  SARS Coronavirus 2 by RT PCR (hospital order, performed in [facility] hospital lab) Nasopharyngeal Nasopharyngeal Swab     Status: None   Collection Time: 08/13/19 11:08 AM   Specimen: Nasopharyngeal Swab  Result Value Ref Range Status   SARS Coronavirus 2 NEGATIVE NEGATIVE Final    Comment: (NOTE) SARS-CoV-2 target nucleic acids are NOT DETECTED.  The SARS-CoV-2 RNA is generally detectable in upper and lower respiratory specimens during the acute phase of infection. The lowest concentration of SARS-CoV-2 viral copies this assay can detect is 250 copies / mL. A negative result does not preclude SARS-CoV-2 infection and should not be used as the sole basis for treatment or other patient management decisions.  A negative result may occur with improper specimen collection / handling, submission of specimen other than nasopharyngeal swab, presence of viral mutation(s) within the areas targeted by this assay, and inadequate number of viral copies (<250 copies / mL). A negative result must be combined with clinical observations, patient history, and epidemiological  information.  Fact Sheet for Patients:   StrictlyIdeas.no  Fact Sheet for Healthcare Providers: BankingDealers.co.za  This test is not yet approved or  cleared by the [place] FDA and has been authorized for detection and/or diagnosis of SARS-CoV-2 by FDA under an Emergency Use Authorization (EUA).  This EUA will remain in effect (meaning this test can be used) for the duration of the COVID-19 declaration under Section 564(b)(1) of the Act, 21 U.S.C. section 360bbb-3(b)(1), unless the authorization is terminated or revoked sooner.  Performed at [facility], [place] [address] [place], [place] [address]      Time coordinating discharge:  I have spent 35 minutes face to face with the patient and on the ward discussing the  patients care, assessment, plan and disposition with other care givers. >50% of the time was devoted counseling the patient about the risks and benefits of treatment/Discharge disposition and coordinating care.   SIGNED:   Damita Lack, MD  [facility] 08/13/2019, 1:56 PM   If 7PM-7AM, please contact night-coverage

## 2019-08-13 NOTE — Progress Notes (Addendum)
Attempted to give report to receiving RN x3. Patient's script in discharge packet

## 2019-08-13 NOTE — TOC Transition Note (Signed)
Transition of Care [facility]) - CM/SW Discharge Note   Patient Details  Name: Heather Petty MRN: [id number] Date of Birth: 1950-05-12  [facility] of [facility]) CM/SW Contact:  Gabrielle Dare Phone Number: 08/13/2019, 2:21 PM   Clinical Narrative:    Patient will Discharge To: Heather Petty Anticipated DC Date: 08/13/19 Family Notified: left vm for daughter Heather Petty, [phone] to return call Transport By: Heather Petty   Per MD patient ready for DC to [facility] . RN, patient, patient's family, and facility notified of DC. Assessment, Fl2/Pasrr, and Discharge Summary sent to facility. RN given number for report [phone], [facility] ). DC packet on chart. Ambulance transport requested for patient for 3:30pm  CSW signing off.  Heather Petty LCSWA [phone]     Final next level of care: Skilled Nursing Facility Barriers to Discharge: No Barriers Identified   Patient Goals and CMS Choice Patient states their goals for this hospitalization and ongoing recovery are:: to get back to rehab CMS Medicare.gov Compare Post Acute Care list provided to:: Patient Choice offered to / list presented to : Patient  Discharge Placement              Patient chooses bed at: [facility] Patient to be transferred to facility by: [place] Name of family member notified: Heather Petty Patient and family notified of of transfer: 08/13/19  Discharge Plan and Services     Post Acute Care Choice: [place]                               Social Determinants of Health (SDOH) Interventions     Readmission Risk Interventions No flowsheet data found.

## 2019-08-15 LAB — CULTURE, BLOOD (ROUTINE X 2)
Culture: NO GROWTH
Culture: NO GROWTH
Special Requests: ADEQUATE
Special Requests: ADEQUATE

## 2019-09-01 ENCOUNTER — Ambulatory Visit (INDEPENDENT_AMBULATORY_CARE_PROVIDER_SITE_OTHER): Payer: Medicare Other | Admitting: Internal Medicine

## 2019-09-01 ENCOUNTER — Other Ambulatory Visit: Payer: Self-pay

## 2019-09-01 ENCOUNTER — Encounter: Payer: Self-pay | Admitting: Internal Medicine

## 2019-09-01 DIAGNOSIS — B9562 Methicillin resistant Staphylococcus aureus infection as the cause of diseases classified elsewhere: Secondary | ICD-10-CM | POA: Diagnosis not present

## 2019-09-01 DIAGNOSIS — R7881 Bacteremia: Secondary | ICD-10-CM | POA: Diagnosis present

## 2019-09-01 NOTE — Progress Notes (Signed)
[facility]ious [facility]  Patient Active Problem List   Diagnosis Date Noted  . MRSA bacteremia 07/22/2019    Priority: High  . Enterococcal bacteremia 07/22/2019    Priority: High  . Sepsis ([place]) 07/19/2018    Priority: High  . Splenomegaly   . Anemia 08/05/2019  . Diarrhea 08/05/2019  . DKA (diabetic ketoacidoses) ([place]) 07/22/2019  . Rhabdomyolysis 07/22/2019  . Acute metabolic encephalopathy 16/11/9602  . Vitiligo 07/22/2019  . 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 Ventricular tachycardia, sustained ([place]) 07/19/2018  . NSTEMI (non-ST elevated myocardial infarction) ([place]) 07/19/2018  . Wound infection 04/03/2018  . Morbid obesity with BMI of 50.0-59.9, adult ([place]) 04/03/2018  . Elevated alkaline phosphatase level 04/03/2018  . Asthma exacerbation 03/14/2018  . Hypotension 03/14/2018  . Lactic acidosis 03/14/2018  . GERD (gastroesophageal reflux disease) 03/14/2018  . Acute renal failure superimposed on stage 3 chronic kidney disease ([place]) 03/14/2018  . Macrocytic anemia 03/14/2018  . Abnormal LFTs 03/14/2018  . Acute kidney injury ([place]) 02/28/2018  . Wound of sacral region 02/28/2018  . Hypothermia 02/28/2018  . Stage II pressure ulcer of sacral region ([place]) 02/15/2018  . DM II (diabetes mellitus, type II), controlled ([place]) 02/09/2018  . Schizophrenia ([place]) 02/09/2018  . Pressure ulcer 02/06/2018  . Acute renal failure (ARF) ([place]) 02/05/2018  . Hyperlipidemia associated with type 2 diabetes mellitus ([place]) 02/05/2018  . Hyperkalemia 02/05/2018  . Dermatitis 02/05/2018  . Open back wound 02/05/2018  . Dysuria 02/05/2018  . Paranoid schizophrenia ([place])   . AKI (acute kidney injury) ([place]) 12/17/2017  . Type II diabetes mellitus with renal manifestations ([place]) 12/17/2017  . HTN (hypertension) 12/17/2017  . Gout     Patient's Medications  New Prescriptions   No medications on file  Previous Medications   ALBUTEROL (VENTOLIN HFA) 108 (90 BASE)  MCG/ACT INHALER    Inhale 2 puffs into the lungs every 4 (four) hours as needed for wheezing or shortness of breath.   ALLOPURINOL (ZYLOPRIM) 300 MG TABLET    Take 300 mg by mouth daily.   AMLODIPINE (NORVASC) 10 MG TABLET    Take 10 mg by mouth daily.   AZELASTINE-FLUTICASONE (DYMISTA) 137-50 MCG/ACT SUSP    Place 1 spray into both nostrils 2 (two) times daily as needed (for allergies).    B COMPLEX VITAMINS TABLET    Take 1 tablet by mouth daily.   BISACODYL (DULCOLAX) 5 MG EC TABLET    Take 1 tablet (5 mg total) by mouth daily as needed for moderate constipation.   COLLAGENASE (SANTYL) OINTMENT    Apply topically daily.   COLLOIDAL OATMEAL (EUCERIN ECZEMA RELIEF) 1 % CREA    Apply 1 application topically 2 (two) times daily.   DAPTOMYCIN (CUBICIN) IVPB    Inject 600 mg into the vein daily for 20 days. Indication:  MRSA/Enterococcus bacteremia First Dose: Yes Last Day of Therapy:  09/01/2019 Labs - Once weekly:  CBC/D, BMP, and CPK Labs - Every other week:  ESR and CRP Method of administration: IV Push Method of administration may be changed at the discretion of home infusion pharmacist based upon assessment of the patient and/or caregiver's ability to self-administer the medication ordered.   DIPHENHYDRAMINE (BENADRYL) 2 % CREAM    Apply 1 application topically 3 (three) times daily as needed for itching.   DOXEPIN (SINEQUAN) 25 MG CAPSULE    Take 25 mg by mouth at bedtime.    FAMOTIDINE (PEPCID) 20 MG  TABLET    Take 20 mg by mouth 2 (two) times daily.   FOLIC ACID (FOLVITE) 1 MG TABLET    Take 1 mg by mouth daily.   GLIPIZIDE (GLUCOTROL XL) 10 MG 24 HR TABLET    Take 10 mg by mouth 2 (two) times daily.    HYDROCORTISONE 2.5 % CREAM    Apply 1 application topically See admin instructions. Apply topically to rash on face twice daily   HYDROXYZINE (ATARAX/VISTARIL) 25 MG TABLET    Take 25 mg by mouth 2 (two) times daily.    INSULIN ASPART (NOVOLOG) 100 UNIT/ML INJECTION    Inject 0-12 Units  into the skin See admin instructions. Inject 0-12 units into the skin 4 times a day (before meals and at bedtime) per sliding scale: BGL <60 or >400  = CALL MD; 60-200 = 0 units; 201-250 = 2 units; 251-300 = 4 units; 301-350 = 6 units; 351-400 = 8 units; 401-450 = 10 units; >450 = 12 units   INSULIN GLARGINE (LANTUS) 100 UNIT/ML INJECTION    Inject 0.25 mLs (25 Units total) into the skin at bedtime.   IPRATROPIUM-ALBUTEROL (DUONEB) 0.5-2.5 (3) MG/3ML SOLN    Take 3 mLs by nebulization every 6 (six) hours as needed (for shortness of breath or wheezing).    MULTIPLE VITAMINS-MINERALS (THERA-M MULTIPLE VITAMINS PO)    Take 1 tablet by mouth daily.   NADOLOL (CORGARD) 20 MG TABLET    Take 1 tablet (20 mg total) by mouth daily.   NYSTATIN (MYCOSTATIN/NYSTOP) POWDER    Apply 1 g topically See admin instructions. Apply topically twice daily as needed for wound care  -  between folds, beneath breasts, bilateral axillae, and under pannus   POLYETHYLENE GLYCOL (MIRALAX / GLYCOLAX) 17 G PACKET    Take 17 g by mouth daily as needed for mild constipation.   RISPERIDONE (RISPERDAL) 0.5 MG TABLET    Take 1 tablet (0.5 mg total) by mouth 2 (two) times daily.   VITAMIN C (ASCORBIC ACID) 500 MG TABLET    Take 500 mg by mouth 2 (two) times daily.   VITAMIN D, ERGOCALCIFEROL, (DRISDOL) 1.25 MG (50000 UT) CAPS CAPSULE    Take 50,000 Units by mouth every Monday.    ZINC SULFATE (ZINC-220 PO)    Take 220 mg by mouth at bedtime.   Modified Medications   No medications on file  Discontinued Medications   No medications on file    Subjective: Heather Petty is in for her hospital follow-up visit.  She was hospitalized last month with fever and encephalopathy.  She was found to have MRSA and enterococcal bacteremia.  Repeat blood cultures were negative.  There was no evidence of endocarditis on TTE.  She refused TEE so I elected to treat her with 6 weeks of IV therapy.  She developed a rash on vancomycin and was switched to  daptomycin.  She was supposed to continue daptomycin through today for a total of 6 weeks of treatment.  [facility] indicates that after discharge she was changed back to vancomycin.  She does not know why.  Review of Systems: Review of Systems  Constitutional: Negative for fever.  Respiratory: Negative for cough and shortness of breath.   Cardiovascular: Negative for chest pain.  Gastrointestinal: Negative for abdominal pain, diarrhea, nausea and vomiting.  Musculoskeletal: Negative for back pain.  Skin:       Desquamation of her palms.    Past  Medical History:  Diagnosis Date  . AKI (acute kidney injury) ([place]) 12/17/2017  . Arthritis   . Asthma   . Depression   . Diabetes mellitus   . Gout   . High cholesterol   . Hypertension   . Morbid obesity ([place])   . Pneumonia 12/17/2017  . Schizophrenia ([place])   . Stroke [facility])     Social History   Tobacco Use  . Smoking status: Former Smoker    Quit date: 02/06/1974    Years since quitting: 45.5  . Smokeless tobacco: Never Used  Vaping Use  . Vaping Use: Never used  Substance Use Topics  . Alcohol use: No  . Drug use: No    Family History  Family history unknown: Yes    Allergies  Allergen Reactions  . Lasix [Furosemide] Other (See Comments)    IV-LASIX ==> Reaction: Burning    Objective: Vitals:   09/01/19 1044  BP: 115/72  Pulse: 73  Temp: (!) 100.7 F (38.2 C)  TempSrc: Oral   There is no height or weight on file to calculate BMI.  Physical Exam Constitutional:      Comments: She is seated in a wheelchair.  Cardiovascular:     Rate and Rhythm: Normal rate and regular rhythm.     Heart sounds: No murmur heard.   Pulmonary:     Effort: Pulmonary effort is normal.     Breath sounds: Normal breath sounds.  Abdominal:     Palpations: Abdomen is soft.     Tenderness: There is no abdominal tenderness.  Musculoskeletal:        General: No swelling or tenderness.    Skin:    Findings: No rash.  Psychiatric:     Comments: Affect.     Lab Results    Problem List Items Addressed This Visit      High   MRSA bacteremia    She has now completed 6 weeks of IV antibiotic therapy for MRSA and enterococcal bacteremia.  I recommend stopping vancomycin now.  Her PICC was removed here in the clinic today.  She will follow-up in 4 weeks.          Michel Bickers, MD [facility] [phone] pager   [phone] cell 09/01/2019, 11:07 AM

## 2019-09-01 NOTE — Assessment & Plan Note (Signed)
She has now completed 6 weeks of IV antibiotic therapy for MRSA and enterococcal bacteremia.  I recommend stopping vancomycin now.  Her PICC was removed here in the clinic today.  She will follow-up in 4 weeks.

## 2019-09-01 NOTE — Progress Notes (Signed)
Per verbal order from Dr Megan Salon, 40 cm Single Lumen Peripherally Inserted Central Catheter removed from right basilic, tip intact. No sutures present. RN confirmed length per chart. Dressing was open and PICC insertion site was not covered by Biopatch or tegaderm dressing. NR notified Dr Megan Salon.  RN cleaned the insertion site with CHG sticks. Pressure and petroleum dressing applied. Pt advised no heavy lifting with this arm, leave dressing for 24 hours and call the office or seek emergent care if dressing becomes soaked with blood or sharp pain presents. Patient verbalized understanding and agreement.  Patient's questions answered to their satisfaction. Patient tolerated procedure well, RN assisted patient to bathroom and then back to her clinic room.  Flanagan contacted transportation back to [facility]. Landis Gandy, RN

## 2019-09-02 ENCOUNTER — Inpatient Hospital Stay (HOSPITAL_COMMUNITY)
Admission: EM | Admit: 2019-09-02 | Discharge: 2019-09-09 | DRG: 193 | Disposition: A | Discharge status: Skilled Nursing Facility | Payer: Medicare Other | Source: Skilled Nursing Facility | Attending: Internal Medicine | Admitting: Internal Medicine

## 2019-09-02 ENCOUNTER — Emergency Department (HOSPITAL_COMMUNITY): Payer: Medicare Other

## 2019-09-02 DIAGNOSIS — R7989 Other specified abnormal findings of blood chemistry: Secondary | ICD-10-CM

## 2019-09-02 DIAGNOSIS — E876 Hypokalemia: Secondary | ICD-10-CM | POA: Diagnosis present

## 2019-09-02 DIAGNOSIS — Y92239 Unspecified place in hospital as the place of occurrence of the external cause: Secondary | ICD-10-CM | POA: Diagnosis not present

## 2019-09-02 DIAGNOSIS — D539 Nutritional anemia, unspecified: Secondary | ICD-10-CM | POA: Diagnosis present

## 2019-09-02 DIAGNOSIS — I10 Essential (primary) hypertension: Secondary | ICD-10-CM | POA: Diagnosis present

## 2019-09-02 DIAGNOSIS — R748 Abnormal levels of other serum enzymes: Secondary | ICD-10-CM | POA: Diagnosis present

## 2019-09-02 DIAGNOSIS — W19XXXA Unspecified fall, initial encounter: Secondary | ICD-10-CM | POA: Diagnosis present

## 2019-09-02 DIAGNOSIS — D649 Anemia, unspecified: Secondary | ICD-10-CM | POA: Diagnosis present

## 2019-09-02 DIAGNOSIS — J189 Pneumonia, unspecified organism: Secondary | ICD-10-CM | POA: Diagnosis present

## 2019-09-02 DIAGNOSIS — J9601 Acute respiratory failure with hypoxia: Secondary | ICD-10-CM | POA: Diagnosis present

## 2019-09-02 DIAGNOSIS — T3695XA Adverse effect of unspecified systemic antibiotic, initial encounter: Secondary | ICD-10-CM | POA: Diagnosis not present

## 2019-09-02 DIAGNOSIS — M79603 Pain in arm, unspecified: Secondary | ICD-10-CM

## 2019-09-02 DIAGNOSIS — E1165 Type 2 diabetes mellitus with hyperglycemia: Secondary | ICD-10-CM | POA: Diagnosis present

## 2019-09-02 DIAGNOSIS — J45909 Unspecified asthma, uncomplicated: Secondary | ICD-10-CM | POA: Diagnosis present

## 2019-09-02 DIAGNOSIS — I959 Hypotension, unspecified: Secondary | ICD-10-CM | POA: Diagnosis not present

## 2019-09-02 DIAGNOSIS — Z888 Allergy status to other drugs, medicaments and biological substances status: Secondary | ICD-10-CM

## 2019-09-02 DIAGNOSIS — Z881 Allergy status to other antibiotic agents status: Secondary | ICD-10-CM

## 2019-09-02 DIAGNOSIS — M25511 Pain in right shoulder: Secondary | ICD-10-CM | POA: Diagnosis present

## 2019-09-02 DIAGNOSIS — Z79899 Other long term (current) drug therapy: Secondary | ICD-10-CM

## 2019-09-02 DIAGNOSIS — Z794 Long term (current) use of insulin: Secondary | ICD-10-CM

## 2019-09-02 DIAGNOSIS — A419 Sepsis, unspecified organism: Secondary | ICD-10-CM

## 2019-09-02 DIAGNOSIS — Z20822 Contact with and (suspected) exposure to covid-19: Secondary | ICD-10-CM | POA: Diagnosis present

## 2019-09-02 DIAGNOSIS — J159 Unspecified bacterial pneumonia: Principal | ICD-10-CM | POA: Diagnosis present

## 2019-09-02 DIAGNOSIS — E669 Obesity, unspecified: Secondary | ICD-10-CM | POA: Diagnosis present

## 2019-09-02 DIAGNOSIS — M109 Gout, unspecified: Secondary | ICD-10-CM | POA: Diagnosis present

## 2019-09-02 DIAGNOSIS — F209 Schizophrenia, unspecified: Secondary | ICD-10-CM | POA: Diagnosis present

## 2019-09-02 DIAGNOSIS — K521 Toxic gastroenteritis and colitis: Secondary | ICD-10-CM | POA: Diagnosis not present

## 2019-09-02 DIAGNOSIS — Z8673 Personal history of transient ischemic attack (TIA), and cerebral infarction without residual deficits: Secondary | ICD-10-CM

## 2019-09-02 DIAGNOSIS — E11649 Type 2 diabetes mellitus with hypoglycemia without coma: Secondary | ICD-10-CM | POA: Diagnosis not present

## 2019-09-02 HISTORY — DX: Type 2 diabetes mellitus without complications: E11.9

## 2019-09-02 LAB — COMPREHENSIVE METABOLIC PANEL
ALT: 214 U/L — ABNORMAL HIGH (ref 0–44)
AST: 254 U/L — ABNORMAL HIGH (ref 15–41)
Albumin: 2.6 g/dL — ABNORMAL LOW (ref 3.5–5.0)
Alkaline Phosphatase: 1055 U/L — ABNORMAL HIGH (ref 38–126)
Anion gap: 14 (ref 5–15)
BUN: 18 mg/dL (ref 8–23)
CO2: 23 mmol/L (ref 22–32)
Calcium: 9.2 mg/dL (ref 8.9–10.3)
Chloride: 101 mmol/L (ref 98–111)
Creatinine, Ser: 0.89 mg/dL (ref 0.44–1.00)
GFR calc Af Amer: >60 mL/min (ref >60)
GFR calc non Af Amer: >60 mL/min (ref >60)
Glucose, Bld: 166 mg/dL — ABNORMAL HIGH (ref 70–99)
Potassium: 3.7 mmol/L (ref 3.5–5.1)
Sodium: 138 mmol/L (ref 135–145)
Total Bilirubin: 2.1 mg/dL — ABNORMAL HIGH (ref 0.3–1.2)
Total Protein: 6.3 g/dL — ABNORMAL LOW (ref 6.5–8.1)

## 2019-09-02 LAB — CBC WITH DIFFERENTIAL/PLATELET
Abs Immature Granulocytes: 0.04 10*3/uL (ref 0.00–0.07)
Basophils Absolute: 0 10*3/uL (ref 0.0–0.1)
Basophils Relative: 0 %
Eosinophils Absolute: 1.3 10*3/uL — ABNORMAL HIGH (ref 0.0–0.5)
Eosinophils Relative: 13 %
HCT: 32.4 % — ABNORMAL LOW (ref 36.0–46.0)
Hemoglobin: 10 g/dL — ABNORMAL LOW (ref 12.0–15.0)
Immature Granulocytes: 0 %
Lymphocytes Relative: 15 %
Lymphs Abs: 1.5 10*3/uL (ref 0.7–4.0)
MCH: 29.3 pg (ref 26.0–34.0)
MCHC: 30.9 g/dL (ref 30.0–36.0)
MCV: 95 fL (ref 80.0–100.0)
Monocytes Absolute: 0.7 10*3/uL (ref 0.1–1.0)
Monocytes Relative: 7 %
Neutro Abs: 6.5 10*3/uL (ref 1.7–7.7)
Neutrophils Relative %: 65 %
Platelets: 255 10*3/uL (ref 150–400)
RBC: 3.41 MIL/uL — ABNORMAL LOW (ref 3.87–5.11)
RDW: 16.8 % — ABNORMAL HIGH (ref 11.5–15.5)
WBC: 10 10*3/uL (ref 4.0–10.5)
nRBC: 0 % (ref 0.0–0.2)

## 2019-09-02 LAB — LACTIC ACID, PLASMA: Lactic Acid, Venous: 1.3 mmol/L (ref 0.5–1.9)

## 2019-09-02 MED ORDER — SODIUM CHLORIDE 0.9% FLUSH
3.0000 mL | Freq: Once | INTRAVENOUS | Status: AC
  Administered 2019-09-04: 3 mL via INTRAVENOUS

## 2019-09-02 MED ORDER — ACETAMINOPHEN 325 MG PO TABS
650.0000 mg | ORAL_TABLET | Freq: Once | ORAL | Status: DC
  Filled 2019-09-02: qty 2

## 2019-09-02 NOTE — ED Triage Notes (Signed)
SEE DOWNTIME TRIAGE CHARTING FROM PREVIOUS SHIFT.  Per report, pt is from [facility], diagnosed with PNA, now having n/v and fever of 102. Pt is alert and oriented x 4. HR enroute 82, bp 110/60, spo2 94%

## 2019-09-03 ENCOUNTER — Inpatient Hospital Stay (HOSPITAL_COMMUNITY): Payer: Medicare Other

## 2019-09-03 ENCOUNTER — Emergency Department (HOSPITAL_COMMUNITY): Payer: Medicare Other

## 2019-09-03 ENCOUNTER — Encounter (HOSPITAL_COMMUNITY): Payer: Self-pay | Admitting: Internal Medicine

## 2019-09-03 DIAGNOSIS — E669 Obesity, unspecified: Secondary | ICD-10-CM | POA: Diagnosis present

## 2019-09-03 DIAGNOSIS — T3695XA Adverse effect of unspecified systemic antibiotic, initial encounter: Secondary | ICD-10-CM | POA: Diagnosis not present

## 2019-09-03 DIAGNOSIS — E876 Hypokalemia: Secondary | ICD-10-CM | POA: Diagnosis present

## 2019-09-03 DIAGNOSIS — Y92239 Unspecified place in hospital as the place of occurrence of the external cause: Secondary | ICD-10-CM | POA: Diagnosis not present

## 2019-09-03 DIAGNOSIS — Z20822 Contact with and (suspected) exposure to covid-19: Secondary | ICD-10-CM | POA: Diagnosis present

## 2019-09-03 DIAGNOSIS — Z794 Long term (current) use of insulin: Secondary | ICD-10-CM | POA: Diagnosis not present

## 2019-09-03 DIAGNOSIS — Z79899 Other long term (current) drug therapy: Secondary | ICD-10-CM | POA: Diagnosis not present

## 2019-09-03 DIAGNOSIS — E11649 Type 2 diabetes mellitus with hypoglycemia without coma: Secondary | ICD-10-CM | POA: Diagnosis not present

## 2019-09-03 DIAGNOSIS — J159 Unspecified bacterial pneumonia: Secondary | ICD-10-CM | POA: Diagnosis present

## 2019-09-03 DIAGNOSIS — J189 Pneumonia, unspecified organism: Secondary | ICD-10-CM | POA: Diagnosis present

## 2019-09-03 DIAGNOSIS — E1165 Type 2 diabetes mellitus with hyperglycemia: Secondary | ICD-10-CM | POA: Diagnosis present

## 2019-09-03 DIAGNOSIS — Z8673 Personal history of transient ischemic attack (TIA), and cerebral infarction without residual deficits: Secondary | ICD-10-CM | POA: Diagnosis not present

## 2019-09-03 DIAGNOSIS — R7989 Other specified abnormal findings of blood chemistry: Secondary | ICD-10-CM

## 2019-09-03 DIAGNOSIS — F209 Schizophrenia, unspecified: Secondary | ICD-10-CM | POA: Diagnosis present

## 2019-09-03 DIAGNOSIS — R748 Abnormal levels of other serum enzymes: Secondary | ICD-10-CM | POA: Diagnosis present

## 2019-09-03 DIAGNOSIS — D649 Anemia, unspecified: Secondary | ICD-10-CM | POA: Diagnosis present

## 2019-09-03 DIAGNOSIS — I959 Hypotension, unspecified: Secondary | ICD-10-CM | POA: Diagnosis not present

## 2019-09-03 DIAGNOSIS — K521 Toxic gastroenteritis and colitis: Secondary | ICD-10-CM | POA: Diagnosis not present

## 2019-09-03 DIAGNOSIS — M25511 Pain in right shoulder: Secondary | ICD-10-CM | POA: Diagnosis present

## 2019-09-03 DIAGNOSIS — I10 Essential (primary) hypertension: Secondary | ICD-10-CM | POA: Diagnosis present

## 2019-09-03 DIAGNOSIS — J45909 Unspecified asthma, uncomplicated: Secondary | ICD-10-CM | POA: Diagnosis present

## 2019-09-03 DIAGNOSIS — Z881 Allergy status to other antibiotic agents status: Secondary | ICD-10-CM | POA: Diagnosis not present

## 2019-09-03 DIAGNOSIS — M109 Gout, unspecified: Secondary | ICD-10-CM | POA: Diagnosis present

## 2019-09-03 DIAGNOSIS — D539 Nutritional anemia, unspecified: Secondary | ICD-10-CM | POA: Diagnosis present

## 2019-09-03 DIAGNOSIS — Z888 Allergy status to other drugs, medicaments and biological substances status: Secondary | ICD-10-CM | POA: Diagnosis not present

## 2019-09-03 DIAGNOSIS — W19XXXA Unspecified fall, initial encounter: Secondary | ICD-10-CM | POA: Diagnosis present

## 2019-09-03 DIAGNOSIS — J9601 Acute respiratory failure with hypoxia: Secondary | ICD-10-CM | POA: Diagnosis present

## 2019-09-03 LAB — CBC WITH DIFFERENTIAL/PLATELET
Abs Immature Granulocytes: 0.03 10*3/uL (ref 0.00–0.07)
Basophils Absolute: 0 10*3/uL (ref 0.0–0.1)
Basophils Relative: 0 %
Eosinophils Absolute: 1.4 10*3/uL — ABNORMAL HIGH (ref 0.0–0.5)
Eosinophils Relative: 19 %
HCT: 26.7 % — ABNORMAL LOW (ref 36.0–46.0)
Hemoglobin: 8.3 g/dL — ABNORMAL LOW (ref 12.0–15.0)
Immature Granulocytes: 0 %
Lymphocytes Relative: 20 %
Lymphs Abs: 1.5 10*3/uL (ref 0.7–4.0)
MCH: 29.3 pg (ref 26.0–34.0)
MCHC: 31.1 g/dL (ref 30.0–36.0)
MCV: 94.3 fL (ref 80.0–100.0)
Monocytes Absolute: 0.5 10*3/uL (ref 0.1–1.0)
Monocytes Relative: 7 %
Neutro Abs: 4 10*3/uL (ref 1.7–7.7)
Neutrophils Relative %: 54 %
Platelets: 225 10*3/uL (ref 150–400)
RBC: 2.83 MIL/uL — ABNORMAL LOW (ref 3.87–5.11)
RDW: 16.7 % — ABNORMAL HIGH (ref 11.5–15.5)
WBC: 7.4 10*3/uL (ref 4.0–10.5)
nRBC: 0 % (ref 0.0–0.2)

## 2019-09-03 LAB — URINALYSIS, ROUTINE W REFLEX MICROSCOPIC
Bilirubin Urine: NEGATIVE
Glucose, UA: 50 mg/dL — AB
Hgb urine dipstick: NEGATIVE
Ketones, ur: NEGATIVE mg/dL
Nitrite: NEGATIVE
Protein, ur: 30 mg/dL — AB
Specific Gravity, Urine: 1.018 (ref 1.005–1.030)
pH: 5 (ref 5.0–8.0)

## 2019-09-03 LAB — BASIC METABOLIC PANEL
Anion gap: 10 (ref 5–15)
BUN: 19 mg/dL (ref 8–23)
CO2: 27 mmol/L (ref 22–32)
Calcium: 9.2 mg/dL (ref 8.9–10.3)
Chloride: 101 mmol/L (ref 98–111)
Creatinine, Ser: 0.85 mg/dL (ref 0.44–1.00)
GFR calc Af Amer: >60 mL/min (ref >60)
GFR calc non Af Amer: >60 mL/min (ref >60)
Glucose, Bld: 126 mg/dL — ABNORMAL HIGH (ref 70–99)
Potassium: 3.4 mmol/L — ABNORMAL LOW (ref 3.5–5.1)
Sodium: 138 mmol/L (ref 135–145)

## 2019-09-03 LAB — HEPATIC FUNCTION PANEL
ALT: 142 U/L — ABNORMAL HIGH (ref 0–44)
AST: 89 U/L — ABNORMAL HIGH (ref 15–41)
Albumin: 2.3 g/dL — ABNORMAL LOW (ref 3.5–5.0)
Alkaline Phosphatase: 806 U/L — ABNORMAL HIGH (ref 38–126)
Bilirubin, Direct: 0.9 mg/dL — ABNORMAL HIGH (ref 0.0–0.2)
Indirect Bilirubin: 0.8 mg/dL (ref 0.3–0.9)
Total Bilirubin: 1.7 mg/dL — ABNORMAL HIGH (ref 0.3–1.2)
Total Protein: 5.7 g/dL — ABNORMAL LOW (ref 6.5–8.1)

## 2019-09-03 LAB — BRAIN NATRIURETIC PEPTIDE: B Natriuretic Peptide: 46.3 pg/mL (ref 0.0–100.0)

## 2019-09-03 LAB — PROTIME-INR
INR: 1.1 (ref 0.8–1.2)
Prothrombin Time: 14 seconds (ref 11.4–15.2)

## 2019-09-03 LAB — SARS CORONAVIRUS 2 BY RT PCR (HOSPITAL ORDER, PERFORMED IN ~~LOC~~ HOSPITAL LAB): SARS Coronavirus 2: NEGATIVE

## 2019-09-03 LAB — PROCALCITONIN: Procalcitonin: 0.56 ng/mL

## 2019-09-03 LAB — TROPONIN I (HIGH SENSITIVITY)
Troponin I (High Sensitivity): 10 ng/L (ref <18)
Troponin I (High Sensitivity): 8 ng/L (ref <18)
Troponin I (High Sensitivity): 11 ng/L (ref <18)
Troponin I (High Sensitivity): 10 ng/L (ref <18)

## 2019-09-03 LAB — CBG MONITORING, ED
Glucose-Capillary: 117 mg/dL — ABNORMAL HIGH (ref 70–99)
Glucose-Capillary: 153 mg/dL — ABNORMAL HIGH (ref 70–99)

## 2019-09-03 LAB — GLUCOSE, CAPILLARY
Glucose-Capillary: 250 mg/dL — ABNORMAL HIGH (ref 70–99)
Glucose-Capillary: 269 mg/dL — ABNORMAL HIGH (ref 70–99)

## 2019-09-03 LAB — HIV ANTIBODY (ROUTINE TESTING W REFLEX): HIV Screen 4th Generation wRfx: NONREACTIVE

## 2019-09-03 LAB — LACTIC ACID, PLASMA: Lactic Acid, Venous: 1.4 mmol/L (ref 0.5–1.9)

## 2019-09-03 LAB — LIPASE, BLOOD: Lipase: 25 U/L (ref 11–51)

## 2019-09-03 MED ORDER — INSULIN GLARGINE 100 UNIT/ML ~~LOC~~ SOLN
25.0000 [IU] | Freq: Every day | SUBCUTANEOUS | Status: DC
  Administered 2019-09-03 – 2019-09-05 (×3): 25 [IU] via SUBCUTANEOUS
  Filled 2019-09-03 (×5): qty 0.25

## 2019-09-03 MED ORDER — NADOLOL 20 MG PO TABS
20.0000 mg | ORAL_TABLET | Freq: Every day | ORAL | Status: DC
  Administered 2019-09-03 – 2019-09-09 (×7): 20 mg via ORAL
  Filled 2019-09-03 (×7): qty 1

## 2019-09-03 MED ORDER — RISPERIDONE 0.5 MG PO TABS
0.5000 mg | ORAL_TABLET | Freq: Two times a day (BID) | ORAL | Status: DC
  Administered 2019-09-03 – 2019-09-09 (×12): 0.5 mg via ORAL
  Filled 2019-09-03 (×15): qty 1

## 2019-09-03 MED ORDER — IPRATROPIUM-ALBUTEROL 0.5-2.5 (3) MG/3ML IN SOLN: 3.0000 mL | Freq: Four times a day (QID) | RESPIRATORY_TRACT | Status: DC | PRN

## 2019-09-03 MED ORDER — POLYETHYLENE GLYCOL 3350 17 G PO PACK: 17.0000 g | PACK | Freq: Every day | ORAL | Status: DC | PRN

## 2019-09-03 MED ORDER — BISACODYL 5 MG PO TBEC: 5.0000 mg | DELAYED_RELEASE_TABLET | Freq: Every day | ORAL | Status: DC | PRN

## 2019-09-03 MED ORDER — ASCORBIC ACID 500 MG PO TABS
500.0000 mg | ORAL_TABLET | Freq: Every day | ORAL | Status: DC
  Administered 2019-09-03 – 2019-09-09 (×7): 500 mg via ORAL
  Filled 2019-09-03 (×8): qty 1

## 2019-09-03 MED ORDER — ONDANSETRON HCL 4 MG PO TABS
4.0000 mg | ORAL_TABLET | Freq: Four times a day (QID) | ORAL | Status: DC | PRN
  Administered 2019-09-03: 4 mg via ORAL
  Filled 2019-09-03: qty 1

## 2019-09-03 MED ORDER — HYDROXYZINE HCL 25 MG PO TABS
25.0000 mg | ORAL_TABLET | Freq: Three times a day (TID) | ORAL | Status: DC
  Administered 2019-09-03 – 2019-09-04 (×5): 25 mg via ORAL
  Filled 2019-09-03 (×4): qty 1

## 2019-09-03 MED ORDER — FOLIC ACID 1 MG PO TABS
1.0000 mg | ORAL_TABLET | Freq: Every day | ORAL | Status: DC
  Administered 2019-09-03 – 2019-09-09 (×7): 1 mg via ORAL
  Filled 2019-09-03 (×7): qty 1

## 2019-09-03 MED ORDER — SODIUM CHLORIDE 0.9 % IV SOLN
2.0000 g | Freq: Once | INTRAVENOUS | Status: AC
  Administered 2019-09-03: 2 g via INTRAVENOUS
  Filled 2019-09-03: qty 20

## 2019-09-03 MED ORDER — INSULIN ASPART 100 UNIT/ML ~~LOC~~ SOLN
0.0000 [IU] | Freq: Three times a day (TID) | SUBCUTANEOUS | Status: DC
  Administered 2019-09-03: 3 [IU] via SUBCUTANEOUS
  Administered 2019-09-04: 5 [IU] via SUBCUTANEOUS
  Administered 2019-09-04: 1 [IU] via SUBCUTANEOUS
  Administered 2019-09-04: 5 [IU] via SUBCUTANEOUS
  Administered 2019-09-05: 3 [IU] via SUBCUTANEOUS
  Administered 2019-09-05 (×2): 2 [IU] via SUBCUTANEOUS
  Administered 2019-09-06: 1 [IU] via SUBCUTANEOUS
  Administered 2019-09-06 – 2019-09-09 (×4): 2 [IU] via SUBCUTANEOUS

## 2019-09-03 MED ORDER — ALLOPURINOL 300 MG PO TABS
300.0000 mg | ORAL_TABLET | Freq: Every day | ORAL | Status: DC
  Administered 2019-09-03 – 2019-09-09 (×7): 300 mg via ORAL
  Filled 2019-09-03 (×6): qty 1
  Filled 2019-09-03: qty 3
  Filled 2019-09-03: qty 1

## 2019-09-03 MED ORDER — SODIUM CHLORIDE 0.9 % IV SOLN
2.0000 g | INTRAVENOUS | Status: DC
  Administered 2019-09-04: 2 g via INTRAVENOUS
  Filled 2019-09-03: qty 2

## 2019-09-03 MED ORDER — ZINC SULFATE 220 (50 ZN) MG PO CAPS
220.0000 mg | ORAL_CAPSULE | Freq: Every day | ORAL | Status: DC
  Administered 2019-09-03 – 2019-09-08 (×6): 220 mg via ORAL
  Filled 2019-09-03 (×6): qty 1

## 2019-09-03 MED ORDER — LOPERAMIDE HCL 2 MG PO CAPS
4.0000 mg | ORAL_CAPSULE | ORAL | Status: DC | PRN
  Administered 2019-09-03 – 2019-09-07 (×4): 4 mg via ORAL
  Filled 2019-09-03 (×4): qty 2

## 2019-09-03 MED ORDER — AMLODIPINE BESYLATE 10 MG PO TABS
10.0000 mg | ORAL_TABLET | Freq: Every day | ORAL | Status: DC
  Administered 2019-09-03 – 2019-09-09 (×7): 10 mg via ORAL
  Filled 2019-09-03 (×5): qty 1
  Filled 2019-09-03: qty 2
  Filled 2019-09-03: qty 1

## 2019-09-03 MED ORDER — BUDESONIDE 0.5 MG/2ML IN SUSP
0.5000 mg | Freq: Two times a day (BID) | RESPIRATORY_TRACT | Status: DC
  Administered 2019-09-03 – 2019-09-09 (×12): 0.5 mg via RESPIRATORY_TRACT
  Filled 2019-09-03 (×13): qty 2

## 2019-09-03 MED ORDER — ONDANSETRON HCL 4 MG/2ML IJ SOLN
4.0000 mg | Freq: Four times a day (QID) | INTRAMUSCULAR | Status: DC | PRN
  Administered 2019-09-08: 4 mg via INTRAVENOUS
  Filled 2019-09-03: qty 2

## 2019-09-03 MED ORDER — GLUCERNA SHAKE PO LIQD
237.0000 mL | Freq: Every day | ORAL | Status: DC
  Administered 2019-09-03 – 2019-09-07 (×5): 237 mL via ORAL
  Filled 2019-09-03: qty 237

## 2019-09-03 MED ORDER — LIDOCAINE 5 % EX PTCH
1.0000 | MEDICATED_PATCH | CUTANEOUS | Status: DC
  Administered 2019-09-03 – 2019-09-09 (×7): 1 via TRANSDERMAL
  Filled 2019-09-03 (×8): qty 1

## 2019-09-03 MED ORDER — DOXEPIN HCL 25 MG PO CAPS
25.0000 mg | ORAL_CAPSULE | Freq: Every day | ORAL | Status: DC
  Administered 2019-09-03: 25 mg via ORAL
  Filled 2019-09-03 (×3): qty 1

## 2019-09-03 MED ORDER — SODIUM CHLORIDE 0.9 % IV SOLN
500.0000 mg | INTRAVENOUS | Status: DC
  Administered 2019-09-04: 500 mg via INTRAVENOUS
  Filled 2019-09-03: qty 500

## 2019-09-03 MED ORDER — FAMOTIDINE 20 MG PO TABS
20.0000 mg | ORAL_TABLET | Freq: Two times a day (BID) | ORAL | Status: DC
  Administered 2019-09-03 – 2019-09-09 (×13): 20 mg via ORAL
  Filled 2019-09-03 (×13): qty 1

## 2019-09-03 MED ORDER — POTASSIUM CHLORIDE CRYS ER 20 MEQ PO TBCR
40.0000 meq | EXTENDED_RELEASE_TABLET | Freq: Once | ORAL | Status: AC
  Administered 2019-09-03: 40 meq via ORAL
  Filled 2019-09-03: qty 2

## 2019-09-03 MED ORDER — SODIUM CHLORIDE 0.9 % IV SOLN
500.0000 mg | Freq: Once | INTRAVENOUS | Status: AC
  Administered 2019-09-03: 500 mg via INTRAVENOUS
  Filled 2019-09-03: qty 500

## 2019-09-03 NOTE — ED Notes (Addendum)
Pt refusing PureWick Placement at this time. Pt will reconsider at a later time. This RN advised this pt to make staff aware when she had to use restroom to provide UA sample.

## 2019-09-03 NOTE — H&P (Signed)
History and Physical    Heather Petty JQZ:[id number] DOB: Oct 19, 1950 DOA: 09/02/2019  PCP: System, Pcp Not In  Patient coming from: [facility].  Chief Complaint: Shortness of breath and cough.  HPI: Heather Petty is a 69 y.o. female with history of diabetes mellitus type 2, hypertension, gout, asthma who was recently admitted to the hospital for MRSA and enterococcal bacteremia patient completed her antibiotic course of 6 weeks 2 days ago and her PICC line was removed also was recently admitted for anemia requiring blood transfusion and gastroenterology was planning outpatient colonoscopy also was found to have elevated LFTs during the recent admissions work-up so far was negative presents to the ER from nursing home after patient was having shortness of breath with productive cough for the last 3 days.  Has some epigastric discomfort no nausea vomiting also has some chest pressure off and on.  ED Course: In the ER patient was not hypoxic was febrile with temperature of 101.7 F lactic acid was normal WBC count was 10 hemoglobin 10 EKG was showing normal sinus rhythm Covid test was negative chest x-ray showing bilateral infiltrates concerning for multifocal pneumonia.  Patient's labs also showed markedly elevated alkaline phosphatase at 1055 which is increased from her recent past.  Total bilirubin of 2.1 AST was 254 ALT 214.  Patient was started on empiric antibiotics admitted for pneumonia which is multifocal with markedly elevated LFTs.  Sonogram of the abdomen shows gallbladder sludge.  No definite evidence of any cholecystitis.  Review of Systems: As per HPI, rest all negative.   Past Medical History:  Diagnosis Date  . Diabetes mellitus without complication ([place])   . Hypertension     Past Surgical History:  Procedure Laterality Date  . TONSILLECTOMY       reports that she has never smoked. She has never used smokeless tobacco. She reports that she does not drink alcohol and  does not use drugs.  Allergies  Allergen Reactions  . Lasix [Furosemide] Other (See Comments)    On MAR    Family History  Family history unknown: Yes    Prior to Admission medications   Medication Sig Start Date End Date Taking? Authorizing Provider  albuterol (VENTOLIN HFA) 108 (90 Base) MCG/ACT inhaler Inhale 1-2 puffs into the lungs every 4 (four) hours as needed for wheezing or shortness of breath.   Yes [provider]  allopurinol (ZYLOPRIM) 300 MG tablet Take 300 mg by mouth daily.   Yes [provider]  amLODipine (NORVASC) 10 MG tablet Take 10 mg by mouth daily.   Yes [provider]  ascorbic acid (VITAMIN C) 500 MG tablet Take 500 mg by mouth daily.   Yes [provider]  Azelastine-Fluticasone (DYMISTA) 137-50 MCG/ACT SUSP Place 1 spray into both nostrils 2 (two) times daily as needed (for allergies).   Yes [provider]  b complex vitamins tablet Take 1 tablet by mouth daily.   Yes [provider]  bisacodyl (DULCOLAX) 5 MG EC tablet Take 5 mg by mouth daily as needed for moderate constipation.   Yes [provider]  diphenhydrAMINE (BENADRYL) 2 % cream Apply topically 3 (three) times daily as needed for itching.   Yes [provider]  doxepin (SINEQUAN) 25 MG capsule Take 25 mg by mouth at bedtime.   Yes [provider]  Emollient (CERAVE) CREA Apply 1 application topically in the morning and at bedtime. To feet, legs, trunk, arms and hands   Yes [provider]  famotidine (PEPCID) 20 MG tablet Take 20 mg by mouth 2 (two) times daily.   Yes [provider]  feeding supplement, GLUCERNA SHAKE, (GLUCERNA SHAKE) LIQD Take 237 mLs by mouth daily.   Yes [provider]  folic acid (FOLVITE) 1 MG tablet Take 1 mg by mouth daily.   Yes [provider]  glipiZIDE (GLUCOTROL) 10 MG tablet Take 10 mg by mouth 2 (two) times daily before a meal.   Yes [provider]  hydrocortisone 2.5 % cream Apply 1 application topically 2 (two) times daily. To face rash   Yes [provider]  hydrOXYzine (ATARAX/VISTARIL) 25 MG tablet Take 25 mg by mouth in the morning and at bedtime.   Yes [provider]  insulin aspart (NOVOLOG) 100 UNIT/ML injection Inject 0-12 Units into the skin 4 (four) times daily -  with meals and at bedtime. If blood sugar is less than 60= call doctor 60-200= 0 units 201-250= 2 units 251-300= 4 units 301-350= 6 units 351-400= 8 units 401-450= 10 units If greater than 450= give 12 units then after administering 12 units check CBG in 2 hours, if over 400 contact MD.   Yes [provider]  insulin glargine (LANTUS) 100 UNIT/ML injection Inject 25 Units into the skin at bedtime.   Yes [provider]  ipratropium-albuterol (DUONEB) 0.5-2.5 (3) MG/3ML SOLN Take 3 mLs by nebulization every 6 (six) hours as needed (for wheezing).   Yes [provider]  lidocaine (LIDODERM) 5 % Place 1 patch onto the skin daily. Remove & Discard patch within 12 hours or as directed by MD   Yes [provider]  Menthol, Topical Analgesic, (BIOFREEZE) 4 % GEL Apply 1 application topically daily. To right and left knees   Yes [provider]  Multiple Vitamin (MULTIVITAMIN WITH MINERALS) TABS tablet Take 1 tablet by mouth daily.   Yes [provider]  nadolol (CORGARD) 20 MG tablet Take 20 mg by mouth daily.   Yes [provider]  nystatin (MYCOSTATIN/NYSTOP) powder Apply 1 application topically 2 (two) times daily. Between folds, beneath breast, bilataxillae, under pannus   Yes [provider]  Polyethyl Glycol-Propyl Glycol (SYSTANE ULTRA) 0.4-0.3 % SOLN Place 2 drops into both eyes in the morning and at bedtime.   Yes [provider]  polyethylene glycol (MIRALAX / GLYCOLAX) 17 g packet Take 17 g by mouth daily as needed for mild constipation.   Yes [provider]  risperiDONE (RISPERDAL) 0.5 MG tablet Take 0.5 mg by mouth 2 (two) times daily.   Yes [provider]  Skin Protectants, Misc. (EUCERIN) cream Apply 1 application topically in the morning and at bedtime.   Yes [provider]  zinc sulfate 220 (50 Zn) MG capsule Take 220 mg by mouth at bedtime.   Yes [provider]    Physical Exam: Constitutional: Moderately built and nourished. Vitals:   09/03/19 0314 09/03/19 0446 09/03/19 0541 09/03/19 0612  BP:  (!) 113/101 (!) 117/56 (!) 108/58  Pulse:  66 64 64  Resp:  (!) 28 22 18   Temp: 98.7 F (37.1 C) 97.9 F (36.6 C)    TempSrc: Oral Oral    SpO2:  100% 100% 99%   Eyes: Anicteric no pallor. ENMT: No discharge from the ears eyes nose or mouth. Neck: No mass felt.  No neck rigidity. Respiratory: No rhonchi or crepitations. Cardiovascular: S1-S2 heard. Abdomen: Soft nontender bowel sounds present. Musculoskeletal: No edema.  No  rash. Skin: No rash. Neurologic: Alert awake oriented to time place and person.  Moves all extremities. Psychiatric: Appears normal.  Normal affect.   Labs on Admission: I have personally reviewed following labs and imaging studies  CBC: Recent Labs  Lab 09/02/19 1917  WBC 10.0  NEUTROABS 6.5  HGB 10.0*  HCT 32.4*  MCV 95.0  PLT 177   Basic Metabolic Panel: Recent Labs  Lab 09/02/19 1917  NA 138  K 3.7  CL 101  CO2 23  GLUCOSE 166*  BUN 18  CREATININE 0.89  CALCIUM 9.2   GFR: CrCl cannot be calculated (Unknown ideal weight.). Liver Function Tests: Recent Labs  Lab 09/02/19 1917  AST 254*  ALT 214*  ALKPHOS 1,055*  BILITOT 2.1*  PROT 6.3*  ALBUMIN 2.6*   No results for input(s): LIPASE, AMYLASE in the last 168 hours. No results for input(s): AMMONIA in the last 168 hours. Coagulation Profile: No results for input(s): INR, PROTIME in the last 168 hours. Cardiac Enzymes: No results for input(s): CKTOTAL, CKMB, CKMBINDEX, TROPONINI in  the last 168 hours. BNP (last 3 results) No results for input(s): PROBNP in the last 8760 hours. HbA1C: No results for input(s): HGBA1C in the last 72 hours. CBG: No results for input(s): GLUCAP in the last 168 hours. Lipid Profile: No results for input(s): CHOL, HDL, LDLCALC, TRIG, CHOLHDL, LDLDIRECT in the last 72 hours. Thyroid Function Tests: No results for input(s): TSH, T4TOTAL, FREET4, T3FREE, THYROIDAB in the last 72 hours. Anemia Panel: No results for input(s): VITAMINB12, FOLATE, FERRITIN, TIBC, IRON, RETICCTPCT in the last 72 hours. Urine analysis: No results found for: COLORURINE, APPEARANCEUR, LABSPEC, PHURINE, GLUCOSEU, HGBUR, BILIRUBINUR, KETONESUR, PROTEINUR, UROBILINOGEN, NITRITE, LEUKOCYTESUR Sepsis Labs: @LABRCNTIP (procalcitonin:4,lacticidven:4) ) Recent Results (from the past 240 hour(s))  SARS Coronavirus 2 by RT PCR (hospital order, performed in [facility] hospital lab) Nasopharyngeal Nasopharyngeal Swab     Status: None   Collection Time: 09/03/19  3:00 AM   Specimen: Nasopharyngeal Swab  Result Value Ref Range Status   SARS Coronavirus 2 NEGATIVE NEGATIVE Final    Comment: (NOTE) SARS-CoV-2 target nucleic acids are NOT DETECTED.  The SARS-CoV-2 RNA is generally detectable in upper and lower respiratory specimens during the acute phase of infection. The lowest concentration of SARS-CoV-2 viral copies this assay can detect is 250 copies / mL. A negative result does not preclude SARS-CoV-2 infection and should not be used as the sole basis for treatment or other patient management decisions.  A negative result may occur with improper specimen collection / handling, submission of specimen other than nasopharyngeal swab, presence of viral mutation(s) within the areas targeted by this assay, and inadequate number of viral copies (<250 copies / mL). A negative result must be combined with clinical observations, patient history, and epidemiological  information.  Fact Sheet for Patients:   StrictlyIdeas.no  Fact Sheet for Healthcare Providers: BankingDealers.co.za  This test is not yet approved or  cleared by the [place] FDA and has been authorized for detection and/or diagnosis of SARS-CoV-2 by FDA under an Emergency Use Authorization (EUA).  This EUA will remain in effect (meaning this test can be used) for the duration of the COVID-19 declaration under Section 564(b)(1) of the Act, 21 U.S.C. section 360bbb-3(b)(1), unless the authorization is terminated or revoked sooner.  Performed at [facility], [place] [address] [place], [place] [address]      Radiological Exams on Admission: DG Chest 2 View  Result Date: 09/02/2019 CLINICAL DATA:  Fever EXAM: CHEST - 2 VIEW COMPARISON:  08/10/2019 FINDINGS: Increased ground-glass opacity within the peripheral right upper lobe with possible small foci of peripheral airspace disease at the left base. No pleural effusion. Stable cardiomediastinal silhouette with aortic atherosclerosis. No pneumothorax. IMPRESSION: Increased peripheral right upper lobe ground-glass opacity with possible small foci of peripheral airspace disease at the left base, findings are suspicious for multifocal pneumonia, possible atypical or viral pneumonia. Electronically Signed   By: Donavan Foil M.D.   On: 09/02/2019 19:46   US Abdomen Limited  Result Date: 09/03/2019 CLINICAL DATA:  Initial evaluation for elevated LFTs. EXAM: ULTRASOUND ABDOMEN LIMITED RIGHT UPPER QUADRANT COMPARISON:  Prior ultrasound from 08/05/2019 and MRI from 08/08/2019 FINDINGS: Gallbladder: Subtle echogenic nonshadowing material within the gallbladder lumen suggestive of a small amount of sludge. No frank shadowing stones. Gallbladder wall measures within normal limits at 3 mm. No free pericholecystic fluid. No sonographic Murphy sign elicited on exam. Common bile duct: Diameter: 5 mm  Liver: No focal lesion identified. Liver demonstrates a heterogeneous echogenic echotexture, suggesting steatosis. Portal vein is patent on color Doppler imaging with normal direction of blood flow towards the liver. Other: None. IMPRESSION: 1. Mild gallbladder sludging without frank cholelithiasis. No sonographic features to suggest acute cholecystitis. 2. No biliary dilatation. 3. Coarse echogenic echotexture of the liver, suggesting steatosis. Electronically Signed   By: Jeannine Boga M.D.   On: 09/03/2019 02:53    EKG: Independently reviewed.  Normal sinus rhythm.  Assessment/Plan Principal Problem:   CAP (community acquired pneumonia) Active Problems:   Uncontrolled type 2 diabetes mellitus with hyperglycemia (HCC)   Elevated LFTs   Essential hypertension   Gout   Asthma   Anemia   Pneumonia    1. Community-acquired pneumonia for which patient is started on empiric antibiotics.  Check MRSA screen.  If positive we will keep patient on vancomycin. 2. Markedly elevated LFTs particularly alkaline phosphatase with the elevated AST ALT and total bilirubin.  We will repeat labs.  Check lipase.  Patient does complain of epigastric discomfort.  Patient has had recent work-up including MRI and hepatic serologies which were unremarkable as per the discharge summary.  Consult GI.  Last time gastroenterology was planning to get a liver biopsy. 3. Recent admission for enterococcal and MRSA bacteremia just finished 6 weeks course of IV antibiotics 2 days ago and PICC line was removed. 4. Diabetes mellitus type 2 uncontrolled last hemoglobin A1c was around 10.4 on June 15.  On Lantus 24 units at bedtime with sliding scale coverage. 5. History of gout on allopurinol. 6. History of hypertension on nadolol and amlodipine. 7. History of asthma presently not wheezing. 8. Anemia recently requiring blood transfusion.  Given the markedly elevated LFTs and pneumonia with multifocal in nature will need  close monitoring for any further worsening and further work-up and will need inpatient status.   DVT prophylaxis: SCDs.  Avoiding anticoagulation due to worsening LFTs. Code Status: Full code. Family Communication: Discussed with patient. Disposition Plan: Back to facility when stable. Consults called: None. Admission status: Inpatient.   Rise Patience MD [facility] Pager [phone].  If 7PM-7AM, please contact night-coverage [email or web address] Password [facility]  09/03/2019, 6:54 AM

## 2019-09-03 NOTE — ED Provider Notes (Signed)
[facility] EMERGENCY DEPARTMENT Provider Note   CSN: [id number] Arrival date & time: 09/02/19  1810   History Chief Complaint  Patient presents with  . Fever    Heather Petty is a 69 y.o. female.  The history is provided by the patient.  Fever She comes in complaining of chest pain which started this morning.  She points to the epigastric area for location of pain.  There is no radiation of pain.  She denies any dyspnea.  There has been some nausea but no vomiting.  She has broken out in sweats.  There has been a mild to moderate cough which is mostly nonproductive.  Pain is moderate she rates it at 5/10.  Nothing makes it better, nothing makes it worse.  She has had some loss of sense of taste but not sense of smell.  She denies any sick contacts.  She has not received the Covid nineteen vaccine, but has been tested frequently.  No past medical history on file.  There are no problems to display for this patient.   ** The histories are not reviewed yet. Please review them in the "History" navigator section and refresh this [place].   OB History   No obstetric history on file.     No family history on file.  Social History   Tobacco Use  . Smoking status: Not on file  Substance Use Topics  . Alcohol use: Not on file  . Drug use: Not on file    Home Medications Prior to Admission medications   Not on File    Allergies    Patient has no allergy information on record.  Review of Systems   Review of Systems  Constitutional: Positive for fever.  All other systems reviewed and are negative.   Physical Exam Updated Vital Signs BP (!) 132/67   Pulse 79   Temp (!) 101.7 F (38.7 C) (Oral)   Resp 18   SpO2 100%   Physical Exam Vitals and nursing note reviewed.   68 year old female, resting comfortably and in no acute distress. Vital signs are significant for elevated temperature. Oxygen saturation is 100%, which is normal. Head is  normocephalic and atraumatic. PERRLA, EOMI. Oropharynx is clear. Neck is nontender and supple without adenopathy or JVD. Back is nontender and there is no CVA tenderness. Lungs have rales diffusely with loudest rales heard at the right base.  There are no wheezes or rhonchi. Chest is nontender. Heart has regular rate and rhythm without murmur. Abdomen is soft, flat, with mild epigastric tenderness.  There is no rebound or guarding.  There is negative Murphy sign.  There are no masses or hepatosplenomegaly and peristalsis is hypoactive. Extremities have no cyanosis or edema, full range of motion is present. Skin is warm and dry without rash. Neurologic: Mental status is normal, cranial nerves are intact, there are no motor or sensory deficits.  ED Results / Procedures / Treatments   Labs (all labs ordered are listed, but only abnormal results are displayed) Labs Reviewed  COMPREHENSIVE METABOLIC PANEL - Abnormal; Notable for the following components:      Result Value   Glucose, Bld 166 (*)    Total Protein 6.3 (*)    Albumin 2.6 (*)    AST 254 (*)    ALT 214 (*)    Alkaline Phosphatase 1,055 (*)    Total Bilirubin 2.1 (*)    All other components within normal limits  CBC WITH DIFFERENTIAL/PLATELET -  Abnormal; Notable for the following components:   RBC 3.41 (*)    Hemoglobin 10.0 (*)    HCT 32.4 (*)    RDW 16.8 (*)    Eosinophils Absolute 1.3 (*)    All other components within normal limits  CULTURE, BLOOD (ROUTINE X 2)  CULTURE, BLOOD (ROUTINE X 2)  SARS CORONAVIRUS 2 BY RT PCR (HOSPITAL ORDER, [facility])  LACTIC ACID, PLASMA  LACTIC ACID, PLASMA  URINALYSIS, ROUTINE W REFLEX MICROSCOPIC  TROPONIN I (HIGH SENSITIVITY)  TROPONIN I (HIGH SENSITIVITY)   Radiology DG Chest 2 View  Result Date: 09/02/2019 CLINICAL DATA:  Fever EXAM: CHEST - 2 VIEW COMPARISON:  08/10/2019 FINDINGS: Increased ground-glass opacity within the peripheral right upper lobe  with possible small foci of peripheral airspace disease at the left base. No pleural effusion. Stable cardiomediastinal silhouette with aortic atherosclerosis. No pneumothorax. IMPRESSION: Increased peripheral right upper lobe ground-glass opacity with possible small foci of peripheral airspace disease at the left base, findings are suspicious for multifocal pneumonia, possible atypical or viral pneumonia. Electronically Signed   By: Donavan Foil M.D.   On: 09/02/2019 19:46    Procedures Procedures (including critical care time)  Medications Ordered in ED Medications  sodium chloride flush (NS) 0.9 % injection 3 mL (has no administration in time range)  acetaminophen (TYLENOL) tablet 650 mg (650 mg Oral Refused 09/02/19 1921)  cefTRIAXone (ROCEPHIN) 2 g in sodium chloride 0.9 % 100 mL IVPB (has no administration in time range)  azithromycin (ZITHROMAX) 500 mg in sodium chloride 0.9 % 250 mL IVPB (has no administration in time range)    ED Course  I have reviewed the triage vital signs and the nursing notes.  Pertinent labs & imaging results that were available during my care of the patient were reviewed by me and considered in my medical decision making (see chart for details).  MDM Rules/Calculators/A&P Chest pain which actually seems more GI in origin.  Fever with cough concerning for pneumonia.  Chest x-ray shows right upper lobe pneumonia with possible faint infiltrates at the left base as well.  WBC is normal with normal differential.  Mild anemia is present.  Labs show markedly elevated alkaline phosphatase as well as mildly elevated bilirubin and moderately elevated transaminases.  Lactic acid level is normal.  She is started on antibiotics for community-acquired pneumonia.  Will send for right upper quadrant ultrasound.  Given complex medical conditions, she is not a candidate for outpatient antibiotics.  Case is discussed with Dr. Hal Hope of [facility], who agrees to admit the  patient.  Old records were found under different medical record number.  She had a right upper quadrant ultrasound which showed fatty liver, and a MRCP which did not show any obvious cause for elevated liver tests.  On 7/7, alkaline phosphatase was 414 and hemoglobin 9.0.  Final Clinical Impression(s) / ED Diagnoses Final diagnoses:  Community acquired pneumonia of right upper lobe of lung  Normochromic normocytic anemia  Elevated liver function tests    Rx / DC Orders ED Discharge Orders    None       Delora Fuel, MD 11/55/20 [phone]

## 2019-09-03 NOTE — Progress Notes (Addendum)
Same day note  Patient seen and examined at bedside.  Patient was admitted to the hospital for shortness of breath and cough for 3 days.  At the time of my evaluation, patient complains of cough, shortness of breath and dyspnea.  Physical examination reveals obese female not in obvious distress, on nasal cannula 2 L/min  Laboratory data and imaging was reviewed  Assessment and Plan.  Community-acquired pneumonia.    Patient was febrile with shortness of breath and cough with chest x-ray showing bilateral infiltrates.  No leukocytosis.  COVID-19 was negative.  Lactate of 1.4.  Continue Rocephin and Zithromax.  Will check proBNP procalcitonin and 2D echocardiogram. Will speech and swallow evaluation.  Elevated LFTs particularly alkaline phosphatase with the elevated AST, ALT and total bilirubin.  Patient has had recent work-up including MRI and hepatic serologies which were unremarkable.  GI was consulted last admission had plans for getting a liver biopsy.  GI consult at some point.  Recent admission for enterococcal and MRSA bacteremia just finished 6 weeks course of IV antibiotics 2 days ago and PICC line was removed.  Could consider ID consultation.  We will see clinical response.  Afebrile at this time.  WBC of 7.4.  Diabetes mellitus type 2.  Last known hemoglobin A1c of 10.4 on June 2021.  Continue Lantus and sliding scale insulin, Accu-Cheks diabetic diet.    History of gout on allopurinol.  History of hypertension on nadolol and amlodipine.  Will closely monitor on blood pressure medication.  History of asthma.  On supplemental oxygen.  Continue steroid inhaler albuterol.  Chronic anemia.  History of blood transfusion.  Hemoglobin of 8.3.  Monitor CBC.  Mild hypokalemia.  Will replenish.  Patient is from [facility]. Spoke with the daughter on the phone and updated her about the clinical condition of the patient.   No Charge  Signed,  Delila Pereyra, MD [facility]

## 2019-09-03 NOTE — ED Notes (Signed)
Pt still not needing to use restroom, this RN will reassess need to use BR at a later time.

## 2019-09-03 NOTE — ED Notes (Signed)
Lunch ordered 

## 2019-09-04 ENCOUNTER — Inpatient Hospital Stay (HOSPITAL_COMMUNITY): Payer: Medicare Other

## 2019-09-04 DIAGNOSIS — E1165 Type 2 diabetes mellitus with hyperglycemia: Secondary | ICD-10-CM | POA: Diagnosis not present

## 2019-09-04 DIAGNOSIS — I10 Essential (primary) hypertension: Secondary | ICD-10-CM | POA: Diagnosis not present

## 2019-09-04 DIAGNOSIS — J45909 Unspecified asthma, uncomplicated: Secondary | ICD-10-CM | POA: Diagnosis not present

## 2019-09-04 DIAGNOSIS — R7989 Other specified abnormal findings of blood chemistry: Secondary | ICD-10-CM | POA: Diagnosis not present

## 2019-09-04 DIAGNOSIS — R0602 Shortness of breath: Secondary | ICD-10-CM

## 2019-09-04 DIAGNOSIS — J189 Pneumonia, unspecified organism: Secondary | ICD-10-CM | POA: Diagnosis not present

## 2019-09-04 LAB — CBC WITH DIFFERENTIAL/PLATELET
Abs Immature Granulocytes: 0.13 10*3/uL — ABNORMAL HIGH (ref 0.00–0.07)
Basophils Absolute: 0 10*3/uL (ref 0.0–0.1)
Basophils Relative: 0 %
Eosinophils Absolute: 1 10*3/uL — ABNORMAL HIGH (ref 0.0–0.5)
Eosinophils Relative: 6 %
HCT: 30.5 % — ABNORMAL LOW (ref 36.0–46.0)
Hemoglobin: 9.6 g/dL — ABNORMAL LOW (ref 12.0–15.0)
Immature Granulocytes: 1 %
Lymphocytes Relative: 4 %
Lymphs Abs: 0.7 10*3/uL (ref 0.7–4.0)
MCH: 30.1 pg (ref 26.0–34.0)
MCHC: 31.5 g/dL (ref 30.0–36.0)
MCV: 95.6 fL (ref 80.0–100.0)
Monocytes Absolute: 0.6 10*3/uL (ref 0.1–1.0)
Monocytes Relative: 3 %
Neutro Abs: 14.7 10*3/uL — ABNORMAL HIGH (ref 1.7–7.7)
Neutrophils Relative %: 86 %
Platelets: 284 10*3/uL (ref 150–400)
RBC: 3.19 MIL/uL — ABNORMAL LOW (ref 3.87–5.11)
RDW: 16.9 % — ABNORMAL HIGH (ref 11.5–15.5)
WBC: 17.3 10*3/uL — ABNORMAL HIGH (ref 4.0–10.5)
nRBC: 0 % (ref 0.0–0.2)

## 2019-09-04 LAB — COMPREHENSIVE METABOLIC PANEL
ALT: 90 U/L — ABNORMAL HIGH (ref 0–44)
AST: 61 U/L — ABNORMAL HIGH (ref 15–41)
Albumin: 2 g/dL — ABNORMAL LOW (ref 3.5–5.0)
Alkaline Phosphatase: 572 U/L — ABNORMAL HIGH (ref 38–126)
Anion gap: 11 (ref 5–15)
BUN: 27 mg/dL — ABNORMAL HIGH (ref 8–23)
CO2: 17 mmol/L — ABNORMAL LOW (ref 22–32)
Calcium: 8.3 mg/dL — ABNORMAL LOW (ref 8.9–10.3)
Chloride: 110 mmol/L (ref 98–111)
Creatinine, Ser: 1.4 mg/dL — ABNORMAL HIGH (ref 0.44–1.00)
GFR calc Af Amer: 44 mL/min — ABNORMAL LOW (ref >60)
GFR calc non Af Amer: 38 mL/min — ABNORMAL LOW (ref >60)
Glucose, Bld: 133 mg/dL — ABNORMAL HIGH (ref 70–99)
Potassium: 4.2 mmol/L (ref 3.5–5.1)
Sodium: 138 mmol/L (ref 135–145)
Total Bilirubin: 1.8 mg/dL — ABNORMAL HIGH (ref 0.3–1.2)
Total Protein: 4.8 g/dL — ABNORMAL LOW (ref 6.5–8.1)

## 2019-09-04 LAB — BLOOD GAS, ARTERIAL
Acid-base deficit: 3.5 mmol/L — ABNORMAL HIGH (ref 0.0–2.0)
Bicarbonate: 20.3 mmol/L (ref 20.0–28.0)
Drawn by: 519031
FIO2: 21
O2 Saturation: 85.9 %
Patient temperature: 37.6
pCO2 arterial: 32.8 mmHg (ref 32.0–48.0)
pH, Arterial: 7.411 (ref 7.350–7.450)
pO2, Arterial: 52.6 mmHg — ABNORMAL LOW (ref 83.0–108.0)

## 2019-09-04 LAB — BASIC METABOLIC PANEL
Anion gap: 10 (ref 5–15)
BUN: 24 mg/dL — ABNORMAL HIGH (ref 8–23)
CO2: 22 mmol/L (ref 22–32)
Calcium: 8.8 mg/dL — ABNORMAL LOW (ref 8.9–10.3)
Chloride: 105 mmol/L (ref 98–111)
Creatinine, Ser: 0.9 mg/dL (ref 0.44–1.00)
GFR calc Af Amer: >60 mL/min (ref >60)
GFR calc non Af Amer: >60 mL/min (ref >60)
Glucose, Bld: 225 mg/dL — ABNORMAL HIGH (ref 70–99)
Potassium: 4.8 mmol/L (ref 3.5–5.1)
Sodium: 137 mmol/L (ref 135–145)

## 2019-09-04 LAB — CBC
HCT: 27.2 % — ABNORMAL LOW (ref 36.0–46.0)
Hemoglobin: 8.8 g/dL — ABNORMAL LOW (ref 12.0–15.0)
MCH: 30.7 pg (ref 26.0–34.0)
MCHC: 32.4 g/dL (ref 30.0–36.0)
MCV: 94.8 fL (ref 80.0–100.0)
Platelets: 253 10*3/uL (ref 150–400)
RBC: 2.87 MIL/uL — ABNORMAL LOW (ref 3.87–5.11)
RDW: 16.6 % — ABNORMAL HIGH (ref 11.5–15.5)
WBC: 8.1 10*3/uL (ref 4.0–10.5)
nRBC: 0 % (ref 0.0–0.2)

## 2019-09-04 LAB — HEPATIC FUNCTION PANEL
ALT: 117 U/L — ABNORMAL HIGH (ref 0–44)
AST: 81 U/L — ABNORMAL HIGH (ref 15–41)
Albumin: 2.2 g/dL — ABNORMAL LOW (ref 3.5–5.0)
Alkaline Phosphatase: 705 U/L — ABNORMAL HIGH (ref 38–126)
Bilirubin, Direct: 0.7 mg/dL — ABNORMAL HIGH (ref 0.0–0.2)
Indirect Bilirubin: 0.8 mg/dL (ref 0.3–0.9)
Total Bilirubin: 1.5 mg/dL — ABNORMAL HIGH (ref 0.3–1.2)
Total Protein: 5.4 g/dL — ABNORMAL LOW (ref 6.5–8.1)

## 2019-09-04 LAB — PROCALCITONIN: Procalcitonin: 0.41 ng/mL

## 2019-09-04 LAB — LACTIC ACID, PLASMA: Lactic Acid, Venous: 1.8 mmol/L (ref 0.5–1.9)

## 2019-09-04 LAB — MAGNESIUM: Magnesium: 1.7 mg/dL (ref 1.7–2.4)

## 2019-09-04 LAB — ECHOCARDIOGRAM COMPLETE
Area-P 1/2: 2.62 cm2
S' Lateral: 2.5 cm

## 2019-09-04 LAB — GLUCOSE, CAPILLARY
Glucose-Capillary: 124 mg/dL — ABNORMAL HIGH (ref 70–99)
Glucose-Capillary: 290 mg/dL — ABNORMAL HIGH (ref 70–99)
Glucose-Capillary: 229 mg/dL — ABNORMAL HIGH (ref 70–99)
Glucose-Capillary: 137 mg/dL — ABNORMAL HIGH (ref 70–99)

## 2019-09-04 LAB — BRAIN NATRIURETIC PEPTIDE: B Natriuretic Peptide: 59.3 pg/mL (ref 0.0–100.0)

## 2019-09-04 MED ORDER — KETOROLAC TROMETHAMINE 30 MG/ML IJ SOLN: 30.0000 mg | Freq: Once | INTRAMUSCULAR | Status: DC

## 2019-09-04 MED ORDER — ACETAMINOPHEN 325 MG PO TABS
650.0000 mg | ORAL_TABLET | Freq: Four times a day (QID) | ORAL | Status: DC | PRN
  Administered 2019-09-04 – 2019-09-06 (×4): 650 mg via ORAL
  Filled 2019-09-04 (×4): qty 2

## 2019-09-04 MED ORDER — SODIUM CHLORIDE 0.9 % IV BOLUS
500.0000 mL | Freq: Once | INTRAVENOUS | Status: AC
  Administered 2019-09-04: 500 mL via INTRAVENOUS

## 2019-09-04 MED ORDER — LACTATED RINGERS IV BOLUS
1000.0000 mL | Freq: Once | INTRAVENOUS | Status: AC
  Administered 2019-09-04: 1000 mL via INTRAVENOUS

## 2019-09-04 MED ORDER — VANCOMYCIN HCL 2000 MG/400ML IV SOLN
2000.0000 mg | Freq: Once | INTRAVENOUS | Status: AC
  Administered 2019-09-04: 2000 mg via INTRAVENOUS
  Filled 2019-09-04: qty 400

## 2019-09-04 MED ORDER — SODIUM CHLORIDE 0.9 % IV BOLUS
500.0000 mL | Freq: Once | INTRAVENOUS | Status: AC | PRN
  Administered 2019-09-04: 500 mL via INTRAVENOUS

## 2019-09-04 MED ORDER — SODIUM CHLORIDE 0.9 % IV SOLN
2.0000 g | Freq: Three times a day (TID) | INTRAVENOUS | Status: AC
  Administered 2019-09-04 – 2019-09-08 (×14): 2 g via INTRAVENOUS
  Filled 2019-09-04 (×15): qty 2

## 2019-09-04 MED ORDER — DIPHENHYDRAMINE HCL 25 MG PO CAPS
25.0000 mg | ORAL_CAPSULE | Freq: Four times a day (QID) | ORAL | Status: DC | PRN
  Administered 2019-09-04 – 2019-09-09 (×4): 25 mg via ORAL
  Filled 2019-09-04 (×4): qty 1

## 2019-09-04 MED ORDER — LACTATED RINGERS IV SOLN: INTRAVENOUS | Status: AC

## 2019-09-04 MED ORDER — ACETAMINOPHEN 325 MG PO TABS
650.0000 mg | ORAL_TABLET | Freq: Once | ORAL | Status: AC
  Administered 2019-09-04: 650 mg via ORAL
  Filled 2019-09-04: qty 2

## 2019-09-04 MED ORDER — VANCOMYCIN HCL IN DEXTROSE 1-5 GM/200ML-% IV SOLN: 1000.0000 mg | INTRAVENOUS | Status: DC

## 2019-09-04 NOTE — Significant Event (Signed)
Nursing staff reported the patient had a fever and was slightly hypotensive.  Patient also was more somnolent.  I saw the patient at bedside.  Patient does not feel well but was still alert awake and communicative but mildly somnolent.  Not in obvious distress.  Febrile.  Patient had received vancomycin today but no chest pain muscle spasms or obvious pruritus or erythema.  Patient does have skin discoloration from prior to coming to the hospital.  ABG was obtained which showed evidence of hypoxia but no hypercarbia.  Patient saturating well on supplemental oxygen on nasal cannula.  Patient is already on broad-spectrum antibiotic with vancomycin and cefepime.  Blood cultures are negative so far.  No leukocytosis.  Urinalysis with 6-10 white blood cells.  We will also get urine culture.  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  Will hold off with further dose of vancomycin.  Received vancomycin dose for today.  Discontinue doxepin today for possible need of linezolid for MRSA coverage.  Will administer 500 mL of normal saline bolus for now.  Due to sluggishness, lethargy and possible deterioration in clinical condition mild hypotension, fever will transfer patient to progressive care unit.  Continue Tylenol.  Added Benadryl p.o. every 6 hours as needed.  Already on Pepcid.  Spoke with the nursing staff at bedside.

## 2019-09-04 NOTE — Significant Event (Addendum)
HOSPITAL MEDICINE OVERNIGHT EVENT NOTE  Notified by nursing that patient continues to exhibit hypotension with systolic blood pressures in the 70s despite being given another 500 cc bolus of normal saline by rapid response within the past hour.  MEWS is red.  Chart reviewed, patient was evaluated by the daytime provider this evening where patient additionally was given a 500 cc bolus during that evaluation as well.  Patient is already receiving broad-spectrum intravenous antibiotic therapy with vancomycin and cefepime which will be continued at this time.  Patient also notably febrile despite 2 doses of Tylenol this afternoon according to nursing.    Provide additional dose of Tylenol for fever.  Will administer 1000 cc of lactated Ringer bolus followed by 125 cc an hour for another liter."  Monitoring with frequent vital signs.  Continuing current antibiotic regimen.  Obtaining CBC, CMP, lactic acid.  Obtaining urinalysis, blood cultures, chest x-ray.  Continue close monitoring.  Will come to perform face-to-face evaluation.  Sherryll Burger Jaevian Shean   ADDENDUM  Performed bedside evaluation.  Patient is awake and alert.  Patient does complain of right shoulder pain, stating it has been ongoing for the past 3 days since her PICC was removed (7/26).  Patient denies SOB, chest pain or abd pain.  On exam, lungs are clear, heart is regular.  Right shoulder is painful with ROM without crepitus or warmth.  Abdomen is soft and nontender.  Continue to monitor closely.  Following labs, BP's.  Daytime provider can consider right shoulder imaging tomorrow.  Sherryll Burger Kaylenn Civil

## 2019-09-04 NOTE — Evaluation (Signed)
Occupational Therapy Evaluation Patient Details Name: Heather Petty MRN: [id number] DOB: 10/04/1950 Today's Date: 09/04/2019    History of Present Illness 69 y.o. female with history of diabetes mellitus type 2, hypertension, gout, asthma, admitted to ED on 7/27 from [facility] with PNA. Pt with recent admissions for MRSA, anemia, GI issues.   Clinical Impression   Pt admitted to hospital from SNF where she was receiving short-term rehab services. Pt presents with deficits in strength, endurance, dynamic standing balance. Pt independence in tasks also limited by pt motivation. Pt requires up to Mod A for bed mobility, Min A x 2 for transfers with reports of dizziness and fatigue. Pt declined further OOB activities after cleanup from soiled linens. Pt requires up to Mod A for UB ADLs and Max A for LB ADLs. Recommend return back to SNF to maximize independence and safety.    Follow Up Recommendations  SNF;Supervision/Assistance - 24 hour    Equipment Recommendations  Other (comment) (TBD)    Recommendations for Other Services       Precautions / Restrictions Precautions Precautions: Fall Restrictions Weight Bearing Restrictions: No      Mobility Bed Mobility Overal bed mobility: Needs Assistance Bed Mobility: Supine to Sit;Sit to Supine     Supine to sit: Min assist Sit to supine: Mod assist;+2 for physical assistance;HOB elevated   General bed mobility comments: Min assist for supine to sit for trunk elevation via HHA, increased time to scoot to EOB. Mod +2 for return to supine for trunk and LE management, scooting up in bed with use of boost function.  Transfers Overall transfer level: Needs assistance Equipment used: Rolling walker (2 wheeled) Transfers: Sit to/from [facility] Sit to Stand: Min assist;+2 safety/equipment;From elevated surface Stand pivot transfers: Min assist;+2 safety/equipment;From elevated surface       General transfer  comment: Min +2 for power up, steadying, verbal cuing for hand placement when rising. Stand x3 from EOB, pt limited by fatigue during pericare as pt incontinent of urine. Pt able to take side steps towards HOB only, very unsteady with poor eccentric control when moving to sitting. Pt also reporting dizziness with mobility, recovered with rest and return to supine.    Balance Overall balance assessment: Needs assistance Sitting-balance support: Feet supported Sitting balance-Leahy Scale: Fair     Standing balance support: Bilateral upper extremity supported Standing balance-Leahy Scale: Poor Standing balance comment: reliant on external support                           ADL either performed or assessed with clinical judgement   ADL Overall ADL's : Needs assistance/impaired Eating/Feeding: Independent;Sitting   Grooming: Set up;Sitting   Upper Body Bathing: Moderate assistance;Sitting   Lower Body Bathing: Maximal assistance;Sit to/from stand Lower Body Bathing Details (indicate cue type and reason): Max A for peri care in standing due to urine incontinence. Pt reports unable to reach for hygiene, except in shower at home Upper Body Dressing : Minimal assistance;Sitting   Lower Body Dressing: Maximal assistance;Sit to/from stand Lower Body Dressing Details (indicate cue type and reason): Overall Max A to don socks sitting EOB. Able to attempt R sock sitting EOB with max encouragement Toilet Transfer: Minimal assistance;Stand-pivot;RW   Toileting- Clothing Manipulation and Hygiene: Maximal assistance;Sit to/from stand         General ADL Comments: Pt self limiting but presents with deficits in strength and endurance impacting ability to complete ADLs  Vision Baseline Vision/History: No visual deficits Patient Visual Report: No change from baseline Vision Assessment?: No apparent visual deficits     Perception     Praxis      Pertinent Vitals/Pain Pain  Assessment: 0-10 Pain Score: 6  Pain Location: bilateral knees Pain Descriptors / Indicators: Sore Pain Intervention(s): Monitored during session     Hand Dominance Right   Extremity/Trunk Assessment Upper Extremity Assessment Upper Extremity Assessment: Generalized weakness   Lower Extremity Assessment Lower Extremity Assessment: Defer to PT evaluation   Cervical / Trunk Assessment Cervical / Trunk Assessment: Normal   Communication Communication Communication: No difficulties   Cognition Arousal/Alertness: Awake/alert Behavior During Therapy: WFL for tasks assessed/performed Overall Cognitive Status: Impaired/Different from baseline Area of Impairment: Problem solving;Safety/judgement                         Safety/Judgement: Decreased awareness of safety;Decreased awareness of deficits   Problem Solving: Difficulty sequencing;Requires verbal cues;Requires tactile cues General Comments: Pt overall WFL for cognition, but very self-limiting and difficult to motivate. Pt lacks understanding on how mobility deficits now carry over into function upon d/c from acute setting, states "I do rehab at [facility], I don't have to do it here"   General Comments  Pt requires max encouragement to participate. Educated on benefits of mobility and OOB activities with pt reporting understanding but still not motivated to get out of bed    Exercises     Shoulder Instructions      Home Living Family/patient expects to be discharged to:: Skilled nursing facility                                 Additional Comments: Pt has been at [facility] for [facility] rehab services.       Prior Functioning/Environment Level of Independence: Needs assistance  Gait / Transfers Assistance Needed: Pt reports walking with RW, states "they try to make me walk too far" ADL's / Homemaking Assistance Needed: Assist for dressing, bathing. Pt had difficulty with these ADLs at baseline             OT Problem List: Decreased strength;Decreased activity tolerance;Impaired balance (sitting and/or standing);Decreased cognition;Decreased safety awareness;Decreased knowledge of use of DME or AE;Cardiopulmonary status limiting activity      OT Treatment/Interventions: Self-care/ADL training;Therapeutic exercise;Energy conservation;DME and/or AE instruction;Therapeutic activities;Patient/family education    OT Goals(Current goals can be found in the care plan section) Acute Rehab OT Goals Patient Stated Goal: get better OT Goal Formulation: With patient Time For Goal Achievement: 09/18/19 Potential to Achieve Goals: Good ADL Goals Pt Will Perform Grooming: with supervision;standing Pt Will Transfer to Toilet: with min guard assist;ambulating;bedside commode Pt Will Perform Toileting - Clothing Manipulation and hygiene: with min assist;sit to/from stand  OT Frequency: Min 1X/week   Barriers to D/C:            Co-evaluation PT/OT/SLP Co-Evaluation/Treatment: Yes Reason for Co-Treatment: Necessary to address cognition/behavior during functional activity;For patient/therapist safety PT goals addressed during session: Mobility/safety with mobility;Balance;Proper use of DME OT goals addressed during session: ADL's and self-care      AM-PAC OT "6 Clicks" Daily Activity     Outcome Measure Help from another person eating meals?: None Help from another person taking care of personal grooming?: A Little Help from another person toileting, which includes using toliet, bedpan, or urinal?: A Lot Help from another  person bathing (including washing, rinsing, drying)?: A Lot Help from another person to put on and taking off regular upper body clothing?: A Little Help from another person to put on and taking off regular lower body clothing?: A Lot 6 Click Score: 16   End of Session Equipment Utilized During Treatment: Gait belt;Rolling walker Nurse Communication: Mobility  status  Activity Tolerance: Patient limited by fatigue Patient left: in bed;with call bell/phone within reach;with bed alarm set  OT Visit Diagnosis: Unsteadiness on feet (R26.81);Other abnormalities of gait and mobility (R26.89);Muscle weakness (generalized) (M62.81)                Time: 2091-0681 OT Time Calculation (min): 27 min Charges:  OT General Charges $OT Visit: 1 Visit OT Evaluation $OT Eval Moderate Complexity: 1 Mod  Layla Maw, OTR/L  Layla Maw 09/04/2019, 11:12 AM

## 2019-09-04 NOTE — Progress Notes (Signed)
RT NOTES: ABG obtained and sent to lab. Lab tech [employer].

## 2019-09-04 NOTE — Consult Note (Signed)
[place] for Infectious Disease    Date of Admission:  09/02/2019     Total days of antibiotics 3               Reason for Consult: Pneumonia  Referring Provider: Pokhrel  Primary Care Provider: System, Pcp Not In   ASSESSMENT:  Ms. Belleville is a 69 y/o female admitted with fever and chest pain with concern for multifocal pneumonia in the setting of recent treatment for enterococcus faecalis, MRSA and Staph hominis with 6 weeks of vancomcyin/daptomycin. Given her recent antibiotic exposure will change antibiotics to vancomycin and cefepime pending culture results. Fever curve appears to be improved. Previous concern for "red man syndrome" noted and will arrange to infuse vancomycin more slowly.  Monitor renal function and vancomycin levels. Blood cultures are without growth to date. TTE is pending.   PLAN:  1. Continue current dose of vancomycin and cefepime. 2. Monitor cultures to determine presence of bacteremia.    Principal Problem:   CAP (community acquired pneumonia) Active Problems:   Uncontrolled type 2 diabetes mellitus with hyperglycemia (HCC)   Elevated LFTs   Essential hypertension   Gout   Asthma   Anemia   Pneumonia   . allopurinol  300 mg Oral Daily  . amLODipine  10 mg Oral Daily  . ascorbic acid  500 mg Oral Daily  . budesonide  0.5 mg Inhalation BID  . doxepin  25 mg Oral QHS  . famotidine  20 mg Oral BID  . feeding supplement (GLUCERNA SHAKE)  237 mL Oral Daily  . folic acid  1 mg Oral Daily  . hydrOXYzine  25 mg Oral TID  . insulin aspart  0-9 Units Subcutaneous TID WC  . insulin glargine  25 Units Subcutaneous QHS  . lidocaine  1 patch Transdermal Q24H  . nadolol  20 mg Oral Daily  . risperiDONE  0.5 mg Oral BID  . zinc sulfate  220 mg Oral QHS     HPI: Heather Petty is a 69 y.o. female with previous medical history of insulin-dependent type 2 diabetes, gout, hypertension, obesity, schizophrenia, and CVA admitted with fever and  epigastric pain.  Ms. Montero was recently hospitalized from 6/14-6/24 for sepsis secondary to MRSA and enterococcal bacteremia with source felt to be secondary to UTI and infected sacral decubitus ulcer.  TTE performed with no valvular vegetations.  A PICC line was placed on 6/22 for IV therapy with vancomycin through 7/26.  She was discharged to [facility] and subsequently readmitted on 6/29 with nonbloody diarrhea for 2 days.  Hospital course complicated by fever of 921.1 and lactic acidosis.  There was concern for possible "red man syndrome" with vancomycin. Discharged on 7/7 with antibiotics changed to daptomycin.   Ms. Insco was seen by Dr. Megan Salon on 7/26 with PICC line removed at that time at the completion of her antibiotic therapy with recommendation for follow up in 4 weeks.  Ms. Oak returned to the hospital on 7/28 with chest pain that started the same morning with pain located in her epigastric region. Mild to moderate non-productive cough present.  Chest x-ray obtained showed increased peripheral right upper lobe groundglass opacity with possible small foci of peripheral airspace disease at the left base with findings suspicious for multifocal pneumonia and possible atypical or viral pneumonia.  Ultrasound of the abdomen with mild gallbladder sludging without frank cholelithiasis and course echogenic echotexture of the liver suggesting steatosis.  Febrile on admission with a  temp of 101.7.  Started on ceftriaxone and azithromycin for presumed pneumonia.    Review of Systems: Review of Systems  Unable to perform ROS: Acuity of condition     Past Medical History:  Diagnosis Date  . Diabetes mellitus without complication ([place])   . Hypertension     Social History   Tobacco Use  . Smoking status: Never Smoker  . Smokeless tobacco: Never Used  Substance Use Topics  . Alcohol use: Never  . Drug use: Never    Family History  Family history unknown: Yes    Allergies    Allergen Reactions  . Lasix [Furosemide] Other (See Comments)    On MAR    OBJECTIVE: Blood pressure (!) 117/48, pulse 78, temperature 99.6 F (37.6 C), resp. rate 16, SpO2 93 %.  Physical Exam Constitutional:      General: She is not in acute distress.    Appearance: She is well-developed. She is ill-appearing.     Comments: Lying in bed with head of bed elevated  Cardiovascular:     Rate and Rhythm: Normal rate and regular rhythm.     Heart sounds: Normal heart sounds.  Pulmonary:     Effort: Pulmonary effort is normal.     Breath sounds: Normal breath sounds.  Skin:    General: Skin is warm and dry.  Neurological:     Mental Status: She is alert.     Lab Results Lab Results  Component Value Date   WBC 8.1 09/04/2019   HGB 8.8 (L) 09/04/2019   HCT 27.2 (L) 09/04/2019   MCV 94.8 09/04/2019   PLT 253 09/04/2019    Lab Results  Component Value Date   CREATININE 0.90 09/04/2019   BUN 24 (H) 09/04/2019   NA 137 09/04/2019   K 4.8 09/04/2019   CL 105 09/04/2019   CO2 22 09/04/2019    Lab Results  Component Value Date   ALT 117 (H) 09/04/2019   AST 81 (H) 09/04/2019   ALKPHOS 705 (H) 09/04/2019   BILITOT 1.5 (H) 09/04/2019     Microbiology: Recent Results (from the past 240 hour(s))  SARS Coronavirus 2 by RT PCR (hospital order, performed in [place] hospital lab) Nasopharyngeal Nasopharyngeal Swab     Status: None   Collection Time: 09/03/19  3:00 AM   Specimen: Nasopharyngeal Swab  Result Value Ref Range Status   SARS Coronavirus 2 NEGATIVE NEGATIVE Final    Comment: (NOTE) SARS-CoV-2 target nucleic acids are NOT DETECTED.  The SARS-CoV-2 RNA is generally detectable in upper and lower respiratory specimens during the acute phase of infection. The lowest concentration of SARS-CoV-2 viral copies this assay can detect is 250 copies / mL. A negative result does not preclude SARS-CoV-2 infection and should not be used as the sole basis for treatment or  other patient management decisions.  A negative result may occur with improper specimen collection / handling, submission of specimen other than nasopharyngeal swab, presence of viral mutation(s) within the areas targeted by this assay, and inadequate number of viral copies (<250 copies / mL). A negative result must be combined with clinical observations, patient history, and epidemiological information.  Fact Sheet for Patients:   StrictlyIdeas.no  Fact Sheet for Healthcare Providers: BankingDealers.co.za  This test is not yet approved or  cleared by the [place] FDA and has been authorized for detection and/or diagnosis of SARS-CoV-2 by FDA under an Emergency Use Authorization (EUA).  This EUA will remain in effect (meaning this  test can be used) for the duration of the COVID-19 declaration under Section 564(b)(1) of the Act, 21 U.S.C. section 360bbb-3(b)(1), unless the authorization is terminated or revoked sooner.  Performed at [facility], [place] [address], [place], [place] [address]      Terri Piedra, [place] [facility]  09/04/2019  11:35 AM

## 2019-09-04 NOTE — Progress Notes (Signed)
PROGRESS NOTE  Heather Petty WLN:[id number] DOB: Dec 18, 1950 DOA: 09/02/2019 PCP: System, Pcp Not In   LOS: 1 day   Brief narrative: As per HPI,  Heather Petty is a 69 y.o. female with history of diabetes mellitus type 2, hypertension, gout, asthma who was recently admitted to the hospital for MRSA and enterococcal bacteremia patient completed her antibiotic course of 6 weeks 2 days ago and her PICC line was removed also was recently admitted for anemia requiring blood transfusion and gastroenterology was planning outpatient colonoscopy also was found to have elevated LFTs during the recent admissions work-up so far was negative presents to the ER from nursing home after patient was having shortness of breath with productive cough for the last 3 days.  Has some epigastric discomfort no nausea vomiting also has some chest pressure off and on.  In the ER, patient with temperature of 101.7 F lactic acid was normal WBC count was 10 hemoglobin 10 EKG was showing normal sinus rhythm. Covid test was negative chest x-ray showing bilateral infiltrates concerning for multifocal pneumonia.  Patient's labs also showed markedly elevated alkaline phosphatase at 1055 which is increased from her recent past.  Total bilirubin of 2.1 AST was 254 ALT 214.  Patient was started on empiric antibiotics admitted for pneumonia which is multifocal with markedly elevated LFTs.  Ultrasonogram of the abdomen shows gallbladder sludge.  No definite evidence of any cholecystitis.  Assessment/Plan:  Principal Problem:   CAP (community acquired pneumonia) Active Problems:   Uncontrolled type 2 diabetes mellitus with hyperglycemia (HCC)   Elevated LFTs   Essential hypertension   Gout   Asthma   Anemia   Pneumonia  Community-acquired pneumonia. Multifocal pneumonia. Procal 0.4>0.5.   No leukocytosis.  COVID-19 was negative.  Lactate of 1.4.  Continue Rocephin and Zithromax.  BNP at 59.3.  Check 2D echocardiogram. Await  speech and swallow evaluation. Blood culture negative in one day.  Elevated LFTs Patient has had recent work-up including MRI and hepatic serologies which were unremarkable.  GI was supposed to follow up as outpatient which has been scheduled from [facility] place . last admission had plans for getting a liver biopsy.  GI consult at some point. LFTs are trending down.  Recent admission for enterococcal and MRSA bacteremia just finished 6 weeks course of IV antibiotics 2 days ago and PICC line was removed. Will get ID consultation. T max of 99.59F..  WBC of 8.1  Diabetes mellitus type 2.  Last known hemoglobin A1c of 10.4 on June 2021.  Continue Lantus and sliding scale insulin, Accu-Cheks diabetic diet.  Last POC glucose of 269  History of gout on allopurinol.  History of hypertension on nadolol and amlodipine.  Will closely monitor   History of asthma.  .  Continue steroid inhaler, albuterol. Wean oxygen as able.  Chronic anemia.  History of blood transfusion.  Hemoglobin of 8.8.  Monitor CBC clsoely.  Mild hypokalemia. replenished. Potassium of 4.8  History of schizophrenia. Used to follow with psychiatry in the past. On risperidone. Encouraged psychiatry follow up as outpatient.  DVT prophylaxis: SCDs Start: 09/03/19 7408   Code Status:  Full code  Family Communication: I again spoke with the patient's daughter on the phone and updated her about the clinical condition of the patient.   Status is: Inpatient  Remains inpatient appropriate because:Unsafe d/c plan, IV treatments appropriate due to intensity of illness or inability to take PO, Inpatient level of care appropriate due to severity of illness  Dispo:  Patient From: [place]  Planned Disposition: [place]  Expected discharge date: 09/06/19  Medically stable for discharge: No  Consultants:  Infectious  disease  Procedures:  none  Antibiotics:   azithromycin  Anti-infectives (From admission, onward)   Start     Dose/Rate Route Frequency Ordered Stop   09/04/19 0400  azithromycin (ZITHROMAX) 500 mg in sodium chloride 0.9 % 250 mL IVPB     Discontinue     500 mg 250 mL/hr over 60 Minutes Intravenous Every 24 hours 09/03/19 0653 09/09/19 0359   09/04/19 0000  cefTRIAXone (ROCEPHIN) 2 g in sodium chloride 0.9 % 100 mL IVPB     Discontinue     2 g 200 mL/hr over 30 Minutes Intravenous Every 24 hours 09/03/19 0653 09/08/19 2359   09/03/19 0200  cefTRIAXone (ROCEPHIN) 2 g in sodium chloride 0.9 % 100 mL IVPB        2 g 200 mL/hr over 30 Minutes Intravenous  Once 09/03/19 0146 09/03/19 0410   09/03/19 0200  azithromycin (ZITHROMAX) 500 mg in sodium chloride 0.9 % 250 mL IVPB        500 mg 250 mL/hr over 60 Minutes Intravenous  Once 09/03/19 0146 09/03/19 0555      Subjective:  Today, patient was seen and examined at bedside. Patient feels better with breathing today. Denies dyspnea today. No chest pain.  She does however complain of some headache and dizziness.  Objective: Vitals:   09/03/19 1610 09/03/19 2115  BP: 109/70 (P) 110/66  Pulse: 69 (P) 71  Resp: 18 (P) 16  Temp: 98.2 F (36.8 C) (P) 98.1 F (36.7 C)  SpO2: 96% (P) 95%    Intake/Output Summary (Last 24 hours) at 09/04/2019 0708 Last data filed at 09/04/2019 0600 Gross per 24 hour  Intake 830 ml  Output --  Net 830 ml   There were no vitals filed for this visit. There is no height or weight on file to calculate BMI.   Physical Exam: GENERAL: Patient is alert awake and communicative. Not in obvious distress.  Mildly anxious. HENT: No scleral pallor or icterus. Pupils equally reactive to light. Oral mucosa is moist NECK: is supple, no gross swelling noted. CHEST: Diminished breath sounds bilateral.  Coarse breath sounds.   CVS: S1 and S2 heard, no murmur. Regular rate and rhythm.  ABDOMEN: Soft, non-tender,  bowel sounds are present. EXTREMITIES: No edema. CNS: Cranial nerves are intact. No focal motor deficits. SKIN: warm and dry without rashes.  Data Review: I have personally reviewed the following laboratory data and studies,  CBC: Recent Labs  Lab 09/02/19 1917 09/03/19 0742 09/04/19 0142  WBC 10.0 7.4 8.1  NEUTROABS 6.5 4.0  --   HGB 10.0* 8.3* 8.8*  HCT 32.4* 26.7* 27.2*  MCV 95.0 94.3 94.8  PLT 255 225 277   Basic Metabolic Panel: Recent Labs  Lab 09/02/19 1917 09/03/19 0742 09/04/19 0142  NA 138 138 137  K 3.7 3.4* 4.8  CL 101 101 105  CO2 23 27 22   GLUCOSE 166* 126* 225*  BUN 18 19 24*  CREATININE 0.89 0.85 0.90  CALCIUM 9.2 9.2 8.8*  MG  --   --  1.7   Liver Function Tests: Recent Labs  Lab 09/02/19 1917 09/03/19 0742  AST 254* 89*  ALT 214* 142*  ALKPHOS 1,055* 806*  BILITOT 2.1* 1.7*  PROT 6.3* 5.7*  ALBUMIN 2.6* 2.3*   Recent Labs  Lab 09/03/19 [id number]  LIPASE 25   No results for input(s): AMMONIA in the last 168 hours. Cardiac Enzymes: No results for input(s): CKTOTAL, CKMB, CKMBINDEX, TROPONINI in the last 168 hours. BNP (last 3 results) Recent Labs    09/03/19 1520 09/04/19 0142  BNP 46.3 59.3    ProBNP (last 3 results) No results for input(s): PROBNP in the last 8760 hours.  CBG: Recent Labs  Lab 09/03/19 0725 09/03/19 1132 09/03/19 1610 09/03/19 2054  GLUCAP 117* 153* 250* 269*   Recent Results (from the past 240 hour(s))  SARS Coronavirus 2 by RT PCR (hospital order, performed in [facility] hospital lab) Nasopharyngeal Nasopharyngeal Swab     Status: None   Collection Time: 09/03/19  3:00 AM   Specimen: Nasopharyngeal Swab  Result Value Ref Range Status   SARS Coronavirus 2 NEGATIVE NEGATIVE Final    Comment: (NOTE) SARS-CoV-2 target nucleic acids are NOT DETECTED.  The SARS-CoV-2 RNA is generally detectable in upper and lower respiratory specimens during the acute phase of infection. The lowest concentration of  SARS-CoV-2 viral copies this assay can detect is 250 copies / mL. A negative result does not preclude SARS-CoV-2 infection and should not be used as the sole basis for treatment or other patient management decisions.  A negative result may occur with improper specimen collection / handling, submission of specimen other than nasopharyngeal swab, presence of viral mutation(s) within the areas targeted by this assay, and inadequate number of viral copies (<250 copies / mL). A negative result must be combined with clinical observations, patient history, and epidemiological information.  Fact Sheet for Patients:   StrictlyIdeas.no  Fact Sheet for Healthcare Providers: BankingDealers.co.za  This test is not yet approved or  cleared by the [place] FDA and has been authorized for detection and/or diagnosis of SARS-CoV-2 by FDA under an Emergency Use Authorization (EUA).  This EUA will remain in effect (meaning this test can be used) for the duration of the COVID-19 declaration under Section 564(b)(1) of the Act, 21 U.S.C. section 360bbb-3(b)(1), unless the authorization is terminated or revoked sooner.  Performed at [facility], [place] [address], [place], [place] [address]      Studies: DG Chest 2 View  Result Date: 09/02/2019 CLINICAL DATA:  Fever EXAM: CHEST - 2 VIEW COMPARISON:  08/10/2019 FINDINGS: Increased ground-glass opacity within the peripheral right upper lobe with possible small foci of peripheral airspace disease at the left base. No pleural effusion. Stable cardiomediastinal silhouette with aortic atherosclerosis. No pneumothorax. IMPRESSION: Increased peripheral right upper lobe ground-glass opacity with possible small foci of peripheral airspace disease at the left base, findings are suspicious for multifocal pneumonia, possible atypical or viral pneumonia. Electronically Signed   By: Donavan Foil M.D.   On: 09/02/2019  19:46   US Abdomen Limited  Result Date: 09/03/2019 CLINICAL DATA:  Initial evaluation for elevated LFTs. EXAM: ULTRASOUND ABDOMEN LIMITED RIGHT UPPER QUADRANT COMPARISON:  Prior ultrasound from 08/05/2019 and MRI from 08/08/2019 FINDINGS: Gallbladder: Subtle echogenic nonshadowing material within the gallbladder lumen suggestive of a small amount of sludge. No frank shadowing stones. Gallbladder wall measures within normal limits at 3 mm. No free pericholecystic fluid. No sonographic Murphy sign elicited on exam. Common bile duct: Diameter: 5 mm Liver: No focal lesion identified. Liver demonstrates a heterogeneous echogenic echotexture, suggesting steatosis. Portal vein is patent on color Doppler imaging with normal direction of blood flow towards the liver. Other: None. IMPRESSION: 1. Mild gallbladder sludging without frank cholelithiasis. No sonographic features to suggest  acute cholecystitis. 2. No biliary dilatation. 3. Coarse echogenic echotexture of the liver, suggesting steatosis. Electronically Signed   By: Jeannine Boga M.D.   On: 09/03/2019 02:53      Flora Lipps, MD  [facility] 09/04/2019

## 2019-09-04 NOTE — Progress Notes (Addendum)
Pt slightly lethargic (change from previous night shift). Pt is hypotensive (70s-80 over 40s-60s), and febrile (101.4 rectal). Called rapid to assess pt. 500 bolus ordered which totals 1500L. Monitor for now.   Day shift nurse made attending aware, new orders entered.   Spoke with on call physician Shalhoub, please see orders for plan of care.

## 2019-09-04 NOTE — Plan of Care (Signed)

## 2019-09-04 NOTE — Progress Notes (Addendum)
Pharmacy Antibiotic Note  Heather Petty is a 69 y.o. female admitted on 09/02/2019 with pneumonia.  Pharmacy has been consulted for Vancomycin dosing.  She was recently discharged from [facility] and complete 6 weeks of Daptomycin for enterococcus and MRSA bacteremia. She has two Epic accounts her MRN for the account with the other information is [id number]. Given her recent IV antibiotic administration we will broaden coverage to cover her for possibly more resistant bugs and narrow as cultures come back.   She does have a noted rash on last admission but it was not definitively caused by vancomycin. Since she is 69 years old and on doxepin we will avoid linezolid and try vancomycin. I will slow the infusion time down and monitor her closely.   Plan: Stop ceftriaxone Stop azithromycin Start cefepime 2g IV q8h Start vancomycin 2068m IV x1 Start vancomycin 10020mIV q24h  Follow up cultures      Temp (24hrs), Avg:98.8 F (37.1 C), Min:98.1 F (36.7 C), Max:99.6 F (37.6 C)  Recent Labs  Lab 09/02/19 1917 09/03/19 0215 09/03/19 0742 09/04/19 0142  WBC 10.0  --  7.4 8.1  CREATININE 0.89  --  0.85 0.90  LATICACIDVEN 1.3 1.4  --   --     CrCl cannot be calculated (Unknown ideal weight.).    Allergies  Allergen Reactions  . Lasix [Furosemide] Other (See Comments)    On MAR    Antimicrobials this admission: Azithromycin 7/28>>7/29 Ceftriaxone 7/28>>7/29 Vancomycin 7/29>> Cefepime 7/29>>   Microbiology results: 7/28 BCx: sent 7/29 Sputum: sent  7/29 MRSA PCR: sent  Thank you for allowing pharmacy to be a part of this patient's care.  TyPhillis Haggis/29/2021 9:34 AM

## 2019-09-04 NOTE — Significant Event (Signed)
Rapid Response Event Note  Overview: Persistent hypotension in the setting of severe sepsis/septic shock  Initial Focused Assessment: Notified by nursing staff regarding persistent hypotension following 1L bolus. Upon arrival, pt was alert, oriented only to self. Skin was hot, pink and dry. Hr 74, 78/36, RR 26 with sats 98% on 2L. No SOB, right CP (PNA). No JVD. Ef 60-65%. BBS clear and diminished. NS bolus 500cc now and notify primary service for further orders. Rectal temp-101.4 F. Treat fever.   Interventions: -NS bolus 500cc -Rectal temp -tylenol for fever  Plan of Care (if not transferred): -Notify primary svc of events and further orders  Event Summary: Call received 1931 Arrived 1935 Call ended 2030   Addendum:2200-99.6 F, 111/57, HR 68, rr 22  Madelynn Done

## 2019-09-04 NOTE — Progress Notes (Signed)
SLP Cancellation Note  Patient Details Name: Heather Petty MRN: [id number] DOB: 1950-05-09   Cancelled treatment:       Reason Eval/Treat Not Completed: Patient's level of consciousness  Patient was unable to maintain alertness level to safely participate in swallowing evaluation at this time.  Will continue efforts to completed evaluation.    Shelly Flatten, MA, [facility] SLP [phone]  [name] 09/04/2019, 1:14 PM

## 2019-09-04 NOTE — Progress Notes (Addendum)
Inpatient Diabetes Program Recommendations  AACE/ADA: New Consensus Statement on Inpatient Glycemic Control (2015)  Target Ranges:  Prepandial:   less than 140 mg/dL      Peak postprandial:   less than 180 mg/dL (1-2 hours)      Critically ill patients:  140 - 180 mg/dL   Lab Results  Component Value Date   GLUCAP 290 (H) 09/04/2019    Review of Glycemic Control Results for DEVEN, AUDI (MRN [id number]) as of 09/04/2019 12:54  Ref. Range 09/03/2019 07:25 09/03/2019 11:32 09/03/2019 16:10 09/03/2019 20:54 09/04/2019 07:51 09/04/2019 12:16  Glucose-Capillary Latest Ref Range: 70 - 99 mg/dL 117 (H) 153 (H) 250 (H) 269 (H) 124 (H) 290 (H)   Diabetes history:  DM2  Outpatient Diabetes medications:  Lantus 25 units daily  Novolog SSI Glipizide 10 mg daily  Current orders for Inpatient glycemic control:  Lantus 25 units QHS Novolog 0-9 units TID    Inpatient Diabetes Program Recommendations:     Post prandials elevated.  Please consider;  Novolog 4 units TID with meals if eats at least 50% of meal  Please obtain current A1C   Will continue to follow while inpatient.  Thank you, Reche Dixon, RN, BSN Diabetes Coordinator Inpatient Diabetes Program [phone] (team pager from 8a-5p)

## 2019-09-04 NOTE — Progress Notes (Signed)
  Echocardiogram 2D Echocardiogram has been performed.  Jennette Dubin 09/04/2019, 9:18 AM

## 2019-09-04 NOTE — Evaluation (Signed)
Physical Therapy Evaluation Patient Details Name: Heather Petty MRN: [id number] DOB: [birth year] Today's Date: 09/04/2019   History of Present Illness  69 y.o. female with history of diabetes mellitus type 2, hypertension, gout, asthma, admitted to ED on 7/27 from [facility] with PNA. Pt with recent admissions for MRSA, anemia, GI issues.  Clinical Impression   Pt presents with generalized weakness, difficulty performing bed mobility and transfer tasks, impaired standing balaance, and decreased activity tolerance partially due to pt self-limiting. Pt to benefit from acute PT to address deficits. Pt required min-mod +2 for mobility tasks today, pt declining ambulation and only agreed to transfer for pericare as pt soiled in urine. PT and OT encouraged mobility for pulmonary health and recovery from PNA, pt states understanding but still not motivated to get OOB. PT recommending return to SNF post-acutely. PT to progress mobility as tolerated, and will continue to follow acutely.      Follow Up Recommendations SNF;Supervision/Assistance - 24 hour    Equipment Recommendations  None recommended by PT    Recommendations for Other Services       Precautions / Restrictions Precautions Precautions: Fall Restrictions Weight Bearing Restrictions: No      Mobility  Bed Mobility Overal bed mobility: Needs Assistance Bed Mobility: Supine to Sit;Sit to Supine     Supine to sit: Min assist Sit to supine: Mod assist;+2 for physical assistance;HOB elevated   General bed mobility comments: Min assist for supine to sit for trunk elevation via HHA, increased time to scoot to EOB. Mod +2 for return to supine for trunk and LE management, scooting up in bed with use of boost function.  Transfers Overall transfer level: Needs assistance Equipment used: Rolling walker (2 wheeled) Transfers: Sit to/from [facility] Sit to Stand: Min assist;+2 safety/equipment;From elevated  surface Stand pivot transfers: Min assist;+2 safety/equipment;From elevated surface       General transfer comment: Min +2 for power up, steadying, verbal cuing for hand placement when rising. Stand x3 from EOB, pt limited by fatigue during pericare as pt incontinent of urine. Pt able to take side steps towards HOB only, very unsteady with poor eccentric control when moving to sitting. Pt also reporting dizziness with mobility, recovered with rest and return to supine.  Ambulation/Gait             General Gait Details: pt refuses  Stairs            Wheelchair Mobility    Modified Rankin (Stroke Patients Only)       Balance Overall balance assessment: Needs assistance Sitting-balance support: Feet supported Sitting balance-Leahy Scale: Fair     Standing balance support: Bilateral upper extremity supported Standing balance-Leahy Scale: Poor Standing balance comment: reliant on external support                             Pertinent Vitals/Pain Pain Assessment: 0-10 Pain Score: 6  Pain Location: bilateral knees Pain Descriptors / Indicators: Sore Pain Intervention(s): Limited activity within patient's tolerance;Monitored during session;Repositioned    Home Living Family/patient expects to be discharged to:: Skilled nursing facility                      Prior Function Level of Independence: Needs assistance   Gait / Transfers Assistance Needed: Pt reports walking with RW, states "they try to make me walk too far"  ADL's / Homemaking Assistance Needed: Assist for dressing, bathing;  mod I for eating        Hand Dominance   Dominant Hand: Right    Extremity/Trunk Assessment   Upper Extremity Assessment Upper Extremity Assessment: Defer to OT evaluation    Lower Extremity Assessment Lower Extremity Assessment: Generalized weakness    Cervical / Trunk Assessment Cervical / Trunk Assessment: Normal  Communication   Communication:  No difficulties  Cognition Arousal/Alertness: Awake/alert Behavior During Therapy: WFL for tasks assessed/performed Overall Cognitive Status: Within Functional Limits for tasks assessed Area of Impairment: Problem solving;Safety/judgement                         Safety/Judgement: Decreased awareness of safety;Decreased awareness of deficits   Problem Solving: Difficulty sequencing;Requires verbal cues;Requires tactile cues General Comments: Pt overall WFL for cognition, but very self-limiting and difficult to motivate. Pt lacks understanding on how mobility deficits now carry over into function upon d/c from acute setting, states "I do rehab at [facility], I don't have to do it here"      General Comments General comments (skin integrity, edema, etc.): PT and OT encouraged mobility for pulmonary health and recovery from PNA, pt states understanding but still not motivated to get OOB.    Exercises     Assessment/Plan    PT Assessment Patient needs continued PT services  PT Problem List Decreased strength;Decreased mobility;Decreased safety awareness;Decreased activity tolerance;Decreased balance;Decreased knowledge of use of DME;Pain       PT Treatment Interventions DME instruction;Therapeutic activities;Gait training;Therapeutic exercise;Patient/family education;Balance training;Functional mobility training;Neuromuscular re-education    PT Goals (Current goals can be found in the Care Plan section)  Acute Rehab PT Goals Patient Stated Goal: get better PT Goal Formulation: With patient Time For Goal Achievement: 09/18/19 Potential to Achieve Goals: Good    Frequency Min 2X/week   Barriers to discharge        Co-evaluation PT/OT/SLP Co-Evaluation/Treatment: Yes Reason for Co-Treatment: For patient/therapist safety;Necessary to address cognition/behavior during functional activity PT goals addressed during session: Mobility/safety with mobility;Balance;Proper use of  DME         AM-PAC PT "6 Clicks" Mobility  Outcome Measure Help needed turning from your back to your side while in a flat bed without using bedrails?: A Little Help needed moving from lying on your back to sitting on the side of a flat bed without using bedrails?: A Lot Help needed moving to and from a bed to a chair (including a wheelchair)?: A Lot Help needed standing up from a chair using your arms (e.g., wheelchair or bedside chair)?: A Little Help needed to walk in hospital room?: A Lot Help needed climbing 3-5 steps with a railing? : A Lot 6 Click Score: 14    End of Session Equipment Utilized During Treatment: Gait belt Activity Tolerance: Patient limited by fatigue;Other (comment) (self-limiting) Patient left: in bed;with call bell/phone within reach;with bed alarm set Nurse Communication: Mobility status PT Visit Diagnosis: Other abnormalities of gait and mobility (R26.89);Muscle weakness (generalized) (M62.81)    Time: 2542-7062 PT Time Calculation (min) (ACUTE ONLY): 27 min   Charges:   PT Evaluation $PT Eval Low Complexity: 1 Low          Donzel Romack E, PT Acute Rehabilitation Services Pager [phone]  Office [phone]  Makenzy Krist D Terisha Losasso 09/04/2019, 10:30 AM

## 2019-09-05 ENCOUNTER — Inpatient Hospital Stay (HOSPITAL_COMMUNITY): Payer: Medicare Other

## 2019-09-05 DIAGNOSIS — R7989 Other specified abnormal findings of blood chemistry: Secondary | ICD-10-CM | POA: Diagnosis not present

## 2019-09-05 DIAGNOSIS — J45909 Unspecified asthma, uncomplicated: Secondary | ICD-10-CM | POA: Diagnosis not present

## 2019-09-05 DIAGNOSIS — I10 Essential (primary) hypertension: Secondary | ICD-10-CM | POA: Diagnosis not present

## 2019-09-05 DIAGNOSIS — J189 Pneumonia, unspecified organism: Secondary | ICD-10-CM | POA: Diagnosis not present

## 2019-09-05 LAB — URINALYSIS, COMPLETE (UACMP) WITH MICROSCOPIC
Bilirubin Urine: NEGATIVE
Glucose, UA: NEGATIVE mg/dL
Hgb urine dipstick: NEGATIVE
Ketones, ur: 5 mg/dL — AB
Nitrite: NEGATIVE
Protein, ur: 30 mg/dL — AB
Specific Gravity, Urine: 1.025 (ref 1.005–1.030)
pH: 5 (ref 5.0–8.0)

## 2019-09-05 LAB — GLUCOSE, CAPILLARY
Glucose-Capillary: 162 mg/dL — ABNORMAL HIGH (ref 70–99)
Glucose-Capillary: 199 mg/dL — ABNORMAL HIGH (ref 70–99)
Glucose-Capillary: 214 mg/dL — ABNORMAL HIGH (ref 70–99)
Glucose-Capillary: 189 mg/dL — ABNORMAL HIGH (ref 70–99)

## 2019-09-05 LAB — CBC
HCT: 28.9 % — ABNORMAL LOW (ref 36.0–46.0)
Hemoglobin: 9.1 g/dL — ABNORMAL LOW (ref 12.0–15.0)
MCH: 29.8 pg (ref 26.0–34.0)
MCHC: 31.5 g/dL (ref 30.0–36.0)
MCV: 94.8 fL (ref 80.0–100.0)
Platelets: 255 10*3/uL (ref 150–400)
RBC: 3.05 MIL/uL — ABNORMAL LOW (ref 3.87–5.11)
RDW: 17.1 % — ABNORMAL HIGH (ref 11.5–15.5)
WBC: 13.3 10*3/uL — ABNORMAL HIGH (ref 4.0–10.5)
nRBC: 0 % (ref 0.0–0.2)

## 2019-09-05 LAB — COMPREHENSIVE METABOLIC PANEL
ALT: 99 U/L — ABNORMAL HIGH (ref 0–44)
AST: 71 U/L — ABNORMAL HIGH (ref 15–41)
Albumin: 1.9 g/dL — ABNORMAL LOW (ref 3.5–5.0)
Alkaline Phosphatase: 505 U/L — ABNORMAL HIGH (ref 38–126)
Anion gap: 10 (ref 5–15)
BUN: 30 mg/dL — ABNORMAL HIGH (ref 8–23)
CO2: 19 mmol/L — ABNORMAL LOW (ref 22–32)
Calcium: 8.5 mg/dL — ABNORMAL LOW (ref 8.9–10.3)
Chloride: 108 mmol/L (ref 98–111)
Creatinine, Ser: 1.39 mg/dL — ABNORMAL HIGH (ref 0.44–1.00)
GFR calc Af Amer: 44 mL/min — ABNORMAL LOW (ref >60)
GFR calc non Af Amer: 38 mL/min — ABNORMAL LOW (ref >60)
Glucose, Bld: 168 mg/dL — ABNORMAL HIGH (ref 70–99)
Potassium: 4.5 mmol/L (ref 3.5–5.1)
Sodium: 137 mmol/L (ref 135–145)
Total Bilirubin: 1.8 mg/dL — ABNORMAL HIGH (ref 0.3–1.2)
Total Protein: 4.7 g/dL — ABNORMAL LOW (ref 6.5–8.1)

## 2019-09-05 LAB — MAGNESIUM: Magnesium: 1.2 mg/dL — ABNORMAL LOW (ref 1.7–2.4)

## 2019-09-05 LAB — PROCALCITONIN: Procalcitonin: 21.66 ng/mL

## 2019-09-05 LAB — PHOSPHORUS: Phosphorus: 3.8 mg/dL (ref 2.5–4.6)

## 2019-09-05 MED ORDER — LINEZOLID 600 MG PO TABS
600.0000 mg | ORAL_TABLET | Freq: Two times a day (BID) | ORAL | Status: AC
  Administered 2019-09-05 – 2019-09-08 (×8): 600 mg via ORAL
  Filled 2019-09-05 (×8): qty 1

## 2019-09-05 MED ORDER — FUROSEMIDE 10 MG/ML IJ SOLN
INTRAMUSCULAR | Status: AC
  Filled 2019-09-05: qty 4

## 2019-09-05 NOTE — Evaluation (Signed)
Clinical/Bedside Swallow Evaluation Patient Details  Name: Heather Petty MRN: [id number] Date of Birth: 1950/04/16  Today's Date: 09/05/2019 Time: SLP Start Time (ACUTE ONLY): 0755 SLP Stop Time (ACUTE ONLY): 0816 SLP Time Calculation (min) (ACUTE ONLY): 21 min  Past Medical History:  Past Medical History:  Diagnosis Date  . Diabetes mellitus without complication ([place])   . Hypertension    Past Surgical History:  Past Surgical History:  Procedure Laterality Date  . TONSILLECTOMY     HPI:  Heather Petty is a 69 y.o. female with history of diabetes mellitus type 2, hypertension, gout, asthma who was recently admitted to the hospital for MRSA and enterococcal bacteremia.  She completed her antibiotic course of 6 weeks 2 days ago and her PICC line was removed.  She was also recently admitted for anemia requiring blood transfusion and gastroenterology was planning outpatient colonoscopy.  She was also found to have elevated LFTs during the recent admissions with  work-up so far being negative.  She presents to the ER from nursing home adue to shortness of breath with productive cough for the last 3 days.  She also some epigastric but no discomfort, nausea or vomiting.  She also reported some chest pressure off and on. Chest xray was showing increased peripheral RUL ground glass opacity with possible small foci of peripheral airspace disease at the left base, findings are suspicious for multifocal PNA - possibly atypical vs viral.  She was diagnosed with community acquired PNA.     Assessment / Plan / Recommendation Clinical Impression  Clinical swallowing evaluation was completed using thin liquids via straw and dual textured solids from the patient's breakfast tray.  Her RN was not reporting any obvious issues with intake.  The patient reported intermittent coughing with intake and difficulty taking pills.  She stated that she currently takes pills in pureed material otherwise she gets  strangled.  Cranial nerve exam was completed and remarkable for decreased sensation on the left side of her face encompassing both the upper and lower portion.  Otherwise, lingual, labial, facial and jaw range of motion and strength were adequate.  She presented with a possible pharyngeal dysphagia.    Mastication of dual textured solids appeared adequate.  Swallow trigger was appreciated to palpation.  She was noted to have a cough resopnse while eating her cereal in milk.  She reported that this does happen occassionally.  Given clinical presentation and reason for admission recommend MBS to fully assess swallowing physiology and safety.  She may continue on current diet pending results of MBS.   SLP Visit Diagnosis: Dysphagia, unspecified (R13.10)    Aspiration Risk  Mild aspiration risk    Diet Recommendation   Regular with thin liquids pending results of MBS.    Medication Administration: Whole meds with puree    Other  Recommendations Oral Care Recommendations: Oral care BID   Follow up Recommendations Other (comment) (TBD)        Swallow Study   General Date of Onset: 09/02/19 HPI: Karris Deangelo is a 69 y.o. female with history of diabetes mellitus type 2, hypertension, gout, asthma who was recently admitted to the hospital for MRSA and enterococcal bacteremia.  She completed her antibiotic course of 6 weeks 2 days ago and her PICC line was removed.  She was also recently admitted for anemia requiring blood transfusion and gastroenterology was planning outpatient colonoscopy.  She was also found to have elevated LFTs during the recent admissions with  work-up so far being negative.  She presents to the ER from nursing home adue to shortness of breath with productive cough for the last 3 days.  She also some epigastric but no discomfort, nausea or vomiting.  She also reported some chest pressure off and on. Chest xray was showing increased peripheral RUL ground glass opacity with  possible small foci of peripheral airspace disease at the left base, findings are suspicious for multifocal PNA - possibly atypical vs viral.  She was diagnosed with community acquired PNA.   Type of Study: Bedside Swallow Evaluation Previous Swallow Assessment: None noted at [facility]. Diet Prior to this Study: Regular;Thin liquids Temperature Spikes Noted: Yes Respiratory Status: Nasal cannula History of Recent Intubation: No Behavior/Cognition: Alert;Cooperative;Pleasant mood Oral Cavity Assessment: Within Functional Limits Oral Care Completed by SLP: No Oral Cavity - Dentition: Missing dentition Vision: Functional for self-feeding Self-Feeding Abilities: Able to feed self Patient Positioning: Upright in bed Baseline Vocal Quality: Normal Volitional Swallow: Able to elicit    Oral/Motor/Sensory Function Overall Oral Motor/Sensory Function: Mild impairment Facial ROM: Within Functional Limits Facial Symmetry: Within Functional Limits Facial Strength: Within Functional Limits Facial Sensation: Reduced left Lingual ROM: Within Functional Limits Lingual Symmetry: Within Functional Limits Lingual Strength: Within Functional Limits Mandible: Within Functional Limits   Ice Chips Ice chips: Not tested   Thin Liquid Thin Liquid: Within functional limits Presentation: Straw    Nectar Thick Nectar Thick Liquid: Not tested   Honey Thick Honey Thick Liquid: Not tested   Puree Puree: Not tested   Solid     Solid: Impaired Presentation: Self Fed Pharyngeal Phase Impairments: Cough - Immediate     Heather Flatten, MA, CCC-SLP Acute Rehab SLP [phone]  Heather Petty 09/05/2019,8:26 AM

## 2019-09-05 NOTE — Progress Notes (Addendum)
PROGRESS NOTE  Heather Petty ZOX:[id number] DOB: 1950-05-31 DOA: 09/02/2019 PCP: System, Pcp Not In   LOS: 2 days   Brief narrative: As per HPI,  Heather Petty is a 69 y.o. female with history of diabetes mellitus type 2, hypertension, gout, asthma who was recently admitted to the hospital for MRSA and enterococcal bacteremia patient completed her antibiotic course of 6 weeks 2 days ago and her PICC line was removed also was recently admitted for anemia requiring blood transfusion and gastroenterology was planning outpatient colonoscopy also was found to have elevated LFTs during the recent admissions work-up so far was negative presents to the ER from nursing home after patient was having shortness of breath with productive cough for the last 3 days.  Has some epigastric discomfort no nausea vomiting also has some chest pressure off and on.  In the ER, patient with temperature of 101.7 F lactic acid was normal WBC count was 10 hemoglobin 10 EKG was showing normal sinus rhythm. Covid test was negative chest x-ray showing bilateral infiltrates concerning for multifocal pneumonia.  Patient's labs also showed markedly elevated alkaline phosphatase at 1055 which is increased from her recent past.  Total bilirubin of 2.1 AST was 254 ALT 214.  Patient was started on empiric antibiotics admitted for pneumonia which is multifocal with markedly elevated LFTs.  Ultrasonogram of the abdomen shows gallbladder sludge.  No definite evidence of any cholecystitis.  Assessment/Plan:  Principal Problem:   CAP (community acquired pneumonia) Active Problems:   Uncontrolled type 2 diabetes mellitus with hyperglycemia (HCC)   Elevated LFTs   Essential hypertension   Gout   Asthma   Anemia   Pneumonia  Community-acquired pneumonia. Multifocal pneumonia with acute hypoxic respiratory failure.. Likely bacterial pneumonia.Procal 0.4>0.5.   No leukocytosis.  COVID-19 was negative.  Lactate of 1.4.  BNP at 59.3.   2D echocardiogram with LV function of 60 to 65%. Await speech and swallow evaluation. Blood culture negative in one day.  Patient received vancomycin and cefepime.  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  There was some concern for allergic reaction yesterday.  Discussed with ID regarding this and has been changed to linezolid.  Currently on 2 L of nasal cannula.  Will wean as possible.  Doxepin has been discontinued for linezolid.  Patient has been seen by speech therapy for swallow evaluation.  Patient is okay for regular consistency diet with thin liquids.  Mild aspiration risk.  Fever, hypotension concern for sepsis.  Cultures negative so far.  Lactate was within normal range.  Patient received IV fluid boluses yesterday.  Continue antibiotics.  Follow-up cultures.   Elevated LFTs Patient has had recent work-up including MRI and hepatic serologies which were unremarkable.  GI was supposed to follow up as outpatient which has been scheduled from [facility] place .  LFTs are trending down.  Recent admission for enterococcal and MRSA bacteremia just finished 6 weeks course of IV antibiotics 2 days ago and PICC line was removed.  ID on board.   WBC of 13.3  Diabetes mellitus type 2.  Last known hemoglobin A1c of 10.4 on June 2021.  Continue Lantus and sliding scale insulin, Accu-Cheks diabetic diet.    History of gout on allopurinol.  History of hypertension on nadolol and amlodipine.  Will closely monitor .  Will refer systolic blood pressure less than 100  History of asthma.   Continue steroid neb, albuterol. Wean oxygen as able.  Chronic simple anemia.  History  of blood transfusion.  Hemoglobin of 9.1 today.  Monitor CBC closely.  Mild hypokalemia. replenished.  Potassium of 4.5 today.  History of schizophrenia. Used to follow with psychiatry in the past. On risperidone. Encouraged psychiatry follow up as outpatient.  DVT prophylaxis: SCDs Start: 09/03/19  6415  Code Status:  Full code  Family Communication: None today.Spoke with the patient's daughter on the phone yesterday.  Status is: Inpatient  Remains inpatient appropriate because:Unsafe d/c plan, IV treatments appropriate due to intensity of illness or inability to take PO, Inpatient level of care appropriate due to severity of illness    Dispo:  Patient From: [place]  Planned Disposition: [place]  Expected discharge date: 09/07/19  Medically stable for discharge: No  Consultants:  Infectious disease  Procedures:  none  Antibiotics:  . Linezolid . cefepime  Subjective:  Today, patient was seen and examined at bedside.  Patient appears to be more alert awake communicative today.  Complains of feeling better with shortness of breath.  Less weakness today.  Objective: Vitals:   09/05/19 0648 09/05/19 0706  BP: (!) 96/44   Pulse:    Resp: (!) 26 19  Temp:    SpO2: 98% 100%    Intake/Output Summary (Last 24 hours) at 09/05/2019 0719 Last data filed at 09/05/2019 0658 Gross per 24 hour  Intake 1152.08 ml  Output 900 ml  Net 252.08 ml   There were no vitals filed for this visit. There is no height or weight on file to calculate BMI.   Physical Exam:  GENERAL: Alert awake and communicative.  Not in obvious distress.  On nasal cannula oxygen.  Mildly anxious. HENT: No scleral pallor or icterus. Pupils equally reactive to light. Oral mucosa is moist NECK: is supple, no gross swelling noted. CHEST: Diminished breath sounds bilateral.  Coarse breath sounds.   CVS: S1 and S2 heard, no murmur. Regular rate and rhythm.  ABDOMEN: Soft, non-tender, bowel sounds are present. EXTREMITIES: No edema. CNS: Cranial nerves are intact. No focal motor deficits. SKIN: warm and dry without rashes.  Data Review: I have personally reviewed the following laboratory data and studies,  CBC: Recent Labs  Lab 09/02/19 1917 09/03/19 0742  09/04/19 0142 09/04/19 2056  WBC 10.0 7.4 8.1 17.3*  NEUTROABS 6.5 4.0  --  14.7*  HGB 10.0* 8.3* 8.8* 9.6*  HCT 32.4* 26.7* 27.2* 30.5*  MCV 95.0 94.3 94.8 95.6  PLT 255 225 253 830   Basic Metabolic Panel: Recent Labs  Lab 09/02/19 1917 09/03/19 0742 09/04/19 0142 09/04/19 2056  NA 138 138 137 138  K 3.7 3.4* 4.8 4.2  CL 101 101 105 110  CO2 23 27 22  17*  GLUCOSE 166* 126* 225* 133*  BUN 18 19 24* 27*  CREATININE 0.89 0.85 0.90 1.40*  CALCIUM 9.2 9.2 8.8* 8.3*  MG  --   --  1.7  --    Liver Function Tests: Recent Labs  Lab 09/02/19 1917 09/03/19 0742 09/04/19 0142 09/04/19 2056  AST 254* 89* 81* 61*  ALT 214* 142* 117* 90*  ALKPHOS 1,055* 806* 705* 572*  BILITOT 2.1* 1.7* 1.5* 1.8*  PROT 6.3* 5.7* 5.4* 4.8*  ALBUMIN 2.6* 2.3* 2.2* 2.0*   Recent Labs  Lab 09/03/19 0742  LIPASE 25   No results for input(s): AMMONIA in the last 168 hours. Cardiac Enzymes: No results for input(s): CKTOTAL, CKMB, CKMBINDEX, TROPONINI in the last 168 hours. BNP (last 3 results) Recent Labs  09/03/19 1520 09/04/19 0142  BNP 46.3 59.3    ProBNP (last 3 results) No results for input(s): PROBNP in the last 8760 hours.  CBG: Recent Labs  Lab 09/03/19 2054 09/04/19 0751 09/04/19 1216 09/04/19 1611 09/04/19 1936  GLUCAP 269* 124* 290* 229* 137*   Recent Results (from the past 240 hour(s))  Culture, blood (routine x 2)     Status: None (Preliminary result)   Collection Time: 09/03/19  1:46 AM   Specimen: BLOOD RIGHT HAND  Result Value Ref Range Status   Specimen Description BLOOD RIGHT HAND  Final   Special Requests   Final    BOTTLES DRAWN AEROBIC AND ANAEROBIC Blood Culture results may not be optimal due to an inadequate volume of blood received in culture bottles   Culture   Final    NO GROWTH 1 DAY Performed at [facility], [place] [address] [place], [place] [address]    Report Status PENDING  Incomplete  Culture, blood (routine x 2)     Status: None  (Preliminary result)   Collection Time: 09/03/19  2:40 AM   Specimen: BLOOD  Result Value Ref Range Status   Specimen Description BLOOD SITE NOT SPECIFIED  Final   Special Requests   Final    BOTTLES DRAWN AEROBIC AND ANAEROBIC Blood Culture results may not be optimal due to an inadequate volume of blood received in culture bottles   Culture   Final    NO GROWTH 1 DAY Performed at [facility], [place] [address], [place], [place] [address]    Report Status PENDING  Incomplete  SARS Coronavirus 2 by RT PCR (hospital order, performed in [facility] hospital lab) Nasopharyngeal Nasopharyngeal Swab     Status: None   Collection Time: 09/03/19  3:00 AM   Specimen: Nasopharyngeal Swab  Result Value Ref Range Status   SARS Coronavirus 2 NEGATIVE NEGATIVE Final    Comment: (NOTE) SARS-CoV-2 target nucleic acids are NOT DETECTED.  The SARS-CoV-2 RNA is generally detectable in upper and lower respiratory specimens during the acute phase of infection. The lowest concentration of SARS-CoV-2 viral copies this assay can detect is 250 copies / mL. A negative result does not preclude SARS-CoV-2 infection and should not be used as the sole basis for treatment or other patient management decisions.  A negative result may occur with improper specimen collection / handling, submission of specimen other than nasopharyngeal swab, presence of viral mutation(s) within the areas targeted by this assay, and inadequate number of viral copies (<250 copies / mL). A negative result must be combined with clinical observations, patient history, and epidemiological information.  Fact Sheet for Patients:   StrictlyIdeas.no  Fact Sheet for Healthcare Providers: BankingDealers.co.za  This test is not yet approved or  cleared by the [place] FDA and has been authorized for detection and/or diagnosis of SARS-CoV-2 by FDA under an Emergency Use Authorization  (EUA).  This EUA will remain in effect (meaning this test can be used) for the duration of the COVID-19 declaration under Section 564(b)(1) of the Act, 21 U.S.C. section 360bbb-3(b)(1), unless the authorization is terminated or revoked sooner.  Performed at [facility], [place] [address], [place],  [address]      Studies: DG Chest 1 View  Result Date: 09/04/2019 CLINICAL DATA:  Sepsis EXAM: CHEST  1 VIEW COMPARISON:  09/02/2019, 08/10/2019, 07/23/2019 FINDINGS: Persistent opacity in the right upper lobe adjacent to the pulmonary fissure. Minimal patchy peripheral opacity at  the left base without change. Mild cardiomegaly with aortic atherosclerosis. No pneumothorax. IMPRESSION: Similar appearance of right upper lobe and left basilar pulmonary opacity, possible pneumonia. Mild cardiomegaly. Electronically Signed   By: Donavan Foil M.D.   On: 09/04/2019 21:39   ECHOCARDIOGRAM COMPLETE  Result Date: 09/04/2019    ECHOCARDIOGRAM REPORT   Patient Name:   Heather Petty Date of Exam: 09/04/2019 Medical Rec #:  [id number]        Height:       66.0 in Accession #:    [id number]       Weight:       160.0 lb Date of Birth:  1950/04/29        BSA:          1.819 m Patient Age:    12 years         BP:           117/48 mmHg Patient Gender: F                HR:           86 bpm. Exam Location:  Inpatient Procedure: 2D Echo Indications:    Dyspnea R06.00  History:        Patient has no prior history of Echocardiogram examinations.                 Risk Factors:Hypertension and Diabetes.  Sonographer:    Mikki Santee RDCS (AE) Referring Phys: [id number] [place]  1. Left ventricular ejection fraction, by estimation, is 60 to 65%. The left ventricle has normal function. The left ventricle has no regional wall motion abnormalities. Left ventricular diastolic parameters were normal.  2. Right ventricular systolic function is normal. The right ventricular size is normal. Tricuspid  regurgitation signal is inadequate for assessing PA pressure.  3. The mitral valve is normal in structure. No evidence of mitral valve regurgitation.  4. The aortic valve was not well visualized. Aortic valve regurgitation is not visualized. No aortic stenosis is present.  5. The inferior vena cava is normal in size with greater than 50% respiratory variability, suggesting right atrial pressure of 3 mmHg. FINDINGS  Left Ventricle: Left ventricular ejection fraction, by estimation, is 60 to 65%. The left ventricle has normal function. The left ventricle has no regional wall motion abnormalities. The left ventricular internal cavity size was normal in size. There is  no left ventricular hypertrophy. Left ventricular diastolic parameters were normal. Right Ventricle: The right ventricular size is normal. Right vetricular wall thickness was not assessed. Right ventricular systolic function is normal. Tricuspid regurgitation signal is inadequate for assessing PA pressure. Left Atrium: Left atrial size was normal in size. Right Atrium: Right atrial size was normal in size. Pericardium: There is no evidence of pericardial effusion. Presence of pericardial fat pad. Mitral Valve: The mitral valve is normal in structure. No evidence of mitral valve regurgitation. Tricuspid Valve: The tricuspid valve is grossly normal. Tricuspid valve regurgitation is trivial. Aortic Valve: The aortic valve was not well visualized. Aortic valve regurgitation is not visualized. No aortic stenosis is present. Pulmonic Valve: The pulmonic valve was not well visualized. Pulmonic valve regurgitation is not visualized. Aorta: The aortic root and ascending aorta are structurally normal, with no evidence of dilitation. Venous: The inferior vena cava is normal in size with greater than 50% respiratory variability, suggesting right atrial pressure of 3 mmHg. IAS/Shunts: No atrial level shunt detected by color flow Doppler.  LEFT VENTRICLE PLAX 2D  LVIDd:          4.00 cm  Diastology LVIDs:         2.50 cm  LV e' lateral:   9.79 cm/s LV PW:         0.80 cm  LV E/e' lateral: 10.3 LV IVS:        0.90 cm  LV e' medial:    7.83 cm/s LVOT diam:     1.80 cm  LV E/e' medial:  12.9 LV SV:         72 LV SV Index:   40 LVOT Area:     2.54 cm  RIGHT VENTRICLE RV S prime:     18.00 cm/s TAPSE (M-mode): 1.9 cm LEFT ATRIUM           Index       RIGHT ATRIUM           Index LA diam:      3.00 cm 1.65 cm/m  RA Area:     10.90 cm LA Vol (A2C): 23.8 ml 13.08 ml/m RA Volume:   21.90 ml  12.04 ml/m LA Vol (A4C): 31.5 ml 17.32 ml/m  AORTIC VALVE LVOT Vmax:   140.00 cm/s LVOT Vmean:  89.900 cm/s LVOT VTI:    0.283 m  AORTA Ao Root diam: 2.40 cm MITRAL VALVE MV Area (PHT): 2.62 cm     SHUNTS MV Decel Time: 289 msec     Systemic VTI:  0.28 m MV E velocity: 101.00 cm/s  Systemic Diam: 1.80 cm MV A velocity: 116.00 cm/s MV E/A ratio:  0.87 Oswaldo Milian MD Electronically signed by Oswaldo Milian MD Signature Date/Time: 09/04/2019/12:07:45 PM    Final       Flora Lipps, MD  [facility] 09/05/2019

## 2019-09-05 NOTE — Progress Notes (Signed)
Modified Barium Swallow Progress Note  Patient Details  Name: Heather Petty MRN: [id number] Date of Birth: [birth year]  Today's Date: 09/05/2019  Modified Barium Swallow completed.  Full report located under Chart Review in the Imaging Section.  Brief recommendations include the following:  Clinical Impression  MBS was completed using thin liquids via spoon, cup and straw, pureed material and solids.  The patient was able to self feed during this exam.  Her oral and pharyngeal swallow were functional.  Mastication of dry solids was adequate and no residue was seen post swallow.  Intermittent transient penetration was seen given thin liquids.  This material was noted to completely clear the laryngeal vestibule.  No pharyngeal residue was seen.  Esophageal sweep did reveal it slow to clear raising suspicion for esophageal dysphagia.  She was encouraged to alternate food/liquids and remain upright for 30-60 minutes after meals.  Recommend a regular diet with thin liquids.  ST follow up is not indicated.  She may benefit from a GI consult.     Swallow Evaluation Recommendations   Recommended Consults: Consider GI evaluation   SLP Diet Recommendations: Thin liquid;Regular solids   Liquid Administration via: Straw;Cup   Medication Administration: Whole meds with liquid   Supervision: Patient able to self feed   Compensations: Follow solids with liquid   Postural Changes: Seated upright at 90 degrees;Remain semi-upright after after feeds/meals (Comment)   Oral Care Recommendations: Oral care BID       Shelly Flatten, MA, CCC-SLP Acute Rehab SLP [phone]  [name] 09/05/2019,11:57 AM

## 2019-09-05 NOTE — TOC Initial Note (Signed)
Transition of Care [facility]) - Initial/Assessment Note    Patient Details  Name: Heather Petty MRN: [id number] Date of Birth: 07-12-1950  [facility] CM/SW Contact:    Pollie Friar, RN Phone Number: 09/05/2019, 3:59 PM  Clinical Narrative:                 CM met with the patient and then spoke to her daughter over the phone. Pt has been at [facility] for SNF rehab. She states she plans on returning at d/c. CM then spoke to pts daughter over the phone (with pt permission). Daughter is in agreement with mother returning to [facility]. She actually is hoping for LT care after rehab as pt lives in a [place] home alone and would have very little support. Pt currently requiring 2+ assist. Pt has been refusing and wants to go home after rehab. Cm encouraged the daughter to take with CSW at [facility]. CM also sent Tracey at [facility] a message to have their CSW call daughter after pt returns.  FL2 done and sent to [facility]. CM asked Linus Orn to being insurance auth for a readmit next week.  TOC following.  Expected Discharge Plan: Skilled Nursing Facility Barriers to Discharge: Continued Medical Work up   Patient Goals and CMS Choice   CMS Medicare.gov Compare Post Acute Care list provided to:: Patient Choice offered to / list presented to : Patient, Adult Children  Expected Discharge Plan and Services Expected Discharge Plan: Skilled Nursing Facility In-house Referral: Clinical Social Work Discharge Planning Services: CM Consult Post Acute Care Choice: [place] arrangements for the past 2 months: [place]                                      Prior Living Arrangements/Services Living arrangements for the past 2 months: Single Family Home Lives with:: Self Patient language and need for interpreter reviewed:: Yes Do you feel safe going back to the place where you live?: Yes      Need for Family Participation in Patient Care: Yes  (Comment) Care giver support system in place?: No (comment)   Criminal Activity/Legal Involvement Pertinent to Current Situation/Hospitalization: No - Comment as needed  Activities of Daily Living      Permission Sought/Granted Permission sought to share information with : Case Manager                Emotional Assessment Appearance:: Appears stated age Attitude/Demeanor/Rapport: Engaged Affect (typically observed): Frustrated Orientation: : Oriented to Self, Oriented to Place, Oriented to  Time, Oriented to Situation Alcohol / Substance Use: Not Applicable Psych Involvement: No (comment)  Admission diagnosis:  Pneumonia [J18.9] Elevated liver function tests [R79.89] CAP (community acquired pneumonia) [J18.9] Normochromic normocytic anemia [D64.9] Community acquired pneumonia of right upper lobe of lung [J18.9] Patient Active Problem List   Diagnosis Date Noted   CAP (community acquired pneumonia) 09/03/2019   Uncontrolled type 2 diabetes mellitus with hyperglycemia ([place]) 09/03/2019   Elevated LFTs 09/03/2019   Essential hypertension 09/03/2019   Gout 09/03/2019   Asthma 09/03/2019   Anemia 09/03/2019   Pneumonia 09/03/2019   PCP:  System, Pcp Not In Pharmacy:  No Pharmacies Listed    Social Determinants of Health (SDOH) Interventions    Readmission Risk Interventions No flowsheet data found.

## 2019-09-05 NOTE — NC FL2 (Signed)
[place] LEVEL OF CARE SCREENING TOOL     IDENTIFICATION  Patient Name: Heather Petty Birthdate: [birth year] Sex: female Admission Date (Current Location): 09/02/2019  [facility] and [place] Number:  Herbalist and Address:  The . [facility], [place] [address] [place], [place] [address]      Provider Number: [id number]  Attending Physician Name and Address:  Flora Lipps, MD  Relative Name and Phone Number:       Current Level of Care: Hospital Recommended Level of Care: [place] Prior Approval Number:    Date Approved/Denied:   PASRR Number: [id number]  Discharge Plan: SNF    Current Diagnoses: Patient Active Problem List   Diagnosis Date Noted   CAP (community acquired pneumonia) 09/03/2019   Uncontrolled type 2 diabetes mellitus with hyperglycemia ([place]) 09/03/2019   Elevated LFTs 09/03/2019   Essential hypertension 09/03/2019   Gout 09/03/2019   Asthma 09/03/2019   Anemia 09/03/2019   Pneumonia 09/03/2019    Orientation RESPIRATION BLADDER Height & Weight     Self, Place  O2 (See d/c summary for oxygen needs) Incontinent Weight:   Height:     BEHAVIORAL SYMPTOMS/MOOD NEUROLOGICAL BOWEL NUTRITION STATUS      Continent Diet (heart healthy/ carb modified)  AMBULATORY STATUS COMMUNICATION OF NEEDS Skin   Extensive Assist Verbally Normal                       Personal Care Assistance Level of Assistance  Bathing, Feeding, Dressing Bathing Assistance: Maximum assistance Feeding assistance: Limited assistance Dressing Assistance: Maximum assistance     Functional Limitations Info  Sight, Hearing, Speech Sight Info: Adequate Hearing Info: Adequate Speech Info: Adequate    SPECIAL CARE FACTORS FREQUENCY  PT (By licensed PT), OT (By licensed OT)     PT Frequency: 5x/wk OT Frequency: 5x/wk            Contractures Contractures Info: Not present    Additional Factors Info   Code Status, Allergies, Psychotropic, Insulin Sliding Scale Code Status Info: Full Allergies Info: vancomycin/ lasix Psychotropic Info: risperdal 0.5 mg BID Insulin Sliding Scale Info: Novolog 0-9 units SQ TID/ Lantus 25 units SQ at bedtime       Current Medications (09/05/2019):  This is the current hospital active medication list Current Facility-Administered Medications  Medication Dose Route Frequency Provider Last Rate Last Admin   acetaminophen (TYLENOL) tablet 650 mg  650 mg Oral Q6H PRN Pokhrel, Laxman, MD   650 mg at 09/05/19 0407   allopurinol (ZYLOPRIM) tablet 300 mg  300 mg Oral Daily Rise Patience, MD   300 mg at 09/05/19 0850   amLODipine (NORVASC) tablet 10 mg  10 mg Oral Daily Pokhrel, Laxman, MD   10 mg at 09/05/19 0850   ascorbic acid (VITAMIN C) tablet 500 mg  500 mg Oral Daily Rise Patience, MD   500 mg at 09/05/19 0849   bisacodyl (DULCOLAX) EC tablet 5 mg  5 mg Oral Daily PRN Rise Patience, MD       budesonide (PULMICORT) nebulizer solution 0.5 mg  0.5 mg Inhalation BID Pokhrel, Laxman, MD   0.5 mg at 09/05/19 0906   ceFEPIme (MAXIPIME) 2 g in sodium chloride 0.9 % 100 mL IVPB  2 g Intravenous Q8H Tommy Medal, Lavell Islam, MD 200 mL/hr at 09/05/19 1315 2 g at 09/05/19 1315   diphenhydrAMINE (BENADRYL) capsule 25 mg  25 mg Oral Q6H PRN Pokhrel,  Laxman, MD   25 mg at 09/04/19 1746   famotidine (PEPCID) tablet 20 mg  20 mg Oral BID Rise Patience, MD   20 mg at 09/05/19 0849   feeding supplement (GLUCERNA SHAKE) (GLUCERNA SHAKE) liquid 237 mL  237 mL Oral Daily Rise Patience, MD   237 mL at 74/16/38 4536   folic acid (FOLVITE) tablet 1 mg  1 mg Oral Daily Rise Patience, MD   1 mg at 09/05/19 0850   insulin aspart (novoLOG) injection 0-9 Units  0-9 Units Subcutaneous TID WC Rise Patience, MD   2 Units at 09/05/19 1320   insulin glargine (LANTUS) injection 25 Units  25 Units Subcutaneous QHS Rise Patience, MD   25  Units at 09/04/19 2321   ipratropium-albuterol (DUONEB) 0.5-2.5 (3) MG/3ML nebulizer solution 3 mL  3 mL Nebulization Q6H PRN Rise Patience, MD       lidocaine (LIDODERM) 5 % 1 patch  1 patch Transdermal Q24H Rise Patience, MD   1 patch at 09/05/19 4680   linezolid (ZYVOX) tablet 600 mg  600 mg Oral Q12H Tommy Medal, Lavell Islam, MD   600 mg at 09/05/19 3212   loperamide (IMODIUM) capsule 4 mg  4 mg Oral PRN Vernelle Emerald, MD   4 mg at 09/03/19 2236   nadolol (CORGARD) tablet 20 mg  20 mg Oral Daily Pokhrel, Laxman, MD   20 mg at 09/05/19 0910   ondansetron (ZOFRAN) tablet 4 mg  4 mg Oral Q6H PRN Rise Patience, MD   4 mg at 09/03/19 2146   Or   ondansetron (ZOFRAN) injection 4 mg  4 mg Intravenous Q6H PRN Rise Patience, MD       polyethylene glycol (MIRALAX / GLYCOLAX) packet 17 g  17 g Oral Daily PRN Rise Patience, MD       risperiDONE (RISPERDAL) tablet 0.5 mg  0.5 mg Oral BID Rise Patience, MD   0.5 mg at 09/05/19 2482   zinc sulfate capsule 220 mg  220 mg Oral QHS Rise Patience, MD   220 mg at 09/04/19 2124     Discharge Medications: Please see discharge summary for a list of discharge medications.  Relevant Imaging Results:  Relevant Lab Results:   Additional Information SS#: [id number]  Pollie Friar, RN

## 2019-09-05 NOTE — Progress Notes (Signed)
[facility] for Infectious Disease  Date of Admission:  09/02/2019     Total days of antibiotics 4         ASSESSMENT:  Ms. Koker is improved this morning and more alert and oriented. Appears she has an allergy/intolerance to vancomycin. Feeling better today. Will change vancomycin linezolid and continue current dose of cefepime. Previous PICC site appears okay. Fever curve and leukocytosis improving. Likely anticipate about 7-10 days of total antibiotics pending any complications.    PLAN:  1. Change vancomycin to linezolid. 2. Continue cefepime. 3. Pharmacy has added vancomycin to allergy list.  4. Anticipate 7-10 days of total antibiotics.   Dr. Baxter Flattery is available this weekend for ID related questions.   Principal Problem:   CAP (community acquired pneumonia) Active Problems:   Uncontrolled type 2 diabetes mellitus with hyperglycemia (HCC)   Elevated LFTs   Essential hypertension   Gout   Asthma   Anemia   Pneumonia   . allopurinol  300 mg Oral Daily  . amLODipine  10 mg Oral Daily  . ascorbic acid  500 mg Oral Daily  . budesonide  0.5 mg Inhalation BID  . famotidine  20 mg Oral BID  . feeding supplement (GLUCERNA SHAKE)  237 mL Oral Daily  . folic acid  1 mg Oral Daily  . furosemide      . insulin aspart  0-9 Units Subcutaneous TID WC  . insulin glargine  25 Units Subcutaneous QHS  . lidocaine  1 patch Transdermal Q24H  . nadolol  20 mg Oral Daily  . risperiDONE  0.5 mg Oral BID  . zinc sulfate  220 mg Oral QHS    SUBJECTIVE:  Febrile overnight with max temp of 101.3 with WBC count increased to 13.3. Hypotensive overnight with fluid replacement. Also noted to have shoulder pain since PICC line removed. Feeling better today. Nursing staff notes improved interaction.   Allergies  Allergen Reactions  . Lasix [Furosemide] Other (See Comments)    On MAR     Review of Systems: Review of Systems  Constitutional: Negative for chills, fever and  weight loss.  Respiratory: Negative for cough, shortness of breath and wheezing.   Cardiovascular: Negative for chest pain and leg swelling.  Gastrointestinal: Negative for abdominal pain, constipation, diarrhea, nausea and vomiting.  Skin: Negative for rash.      OBJECTIVE: Vitals:   09/05/19 0633 09/05/19 0648 09/05/19 0706 09/05/19 0720  BP: (!) 83/39 (!) 96/44  (!) 94/46  Pulse:    73  Resp: 22 (!) 26 19   Temp:    98.1 F (36.7 C)  TempSrc:    Oral  SpO2: 99% 98% 100%    There is no height or weight on file to calculate BMI.  Physical Exam Constitutional:      General: She is not in acute distress.    Appearance: She is well-developed.     Interventions: Nasal cannula in place.     Comments: Lying in bed with head of bed elevated; pleasant.   Cardiovascular:     Rate and Rhythm: Normal rate and regular rhythm.     Heart sounds: Normal heart sounds.  Pulmonary:     Effort: Pulmonary effort is normal.     Breath sounds: Normal breath sounds.  Skin:    General: Skin is warm and dry.  Neurological:     Mental Status: She is alert and oriented to person, place, and time.  Psychiatric:  Behavior: Behavior normal.        Thought Content: Thought content normal.        Judgment: Judgment normal.     Lab Results Lab Results  Component Value Date   WBC 13.3 (H) 09/05/2019   HGB 9.1 (L) 09/05/2019   HCT 28.9 (L) 09/05/2019   MCV 94.8 09/05/2019   PLT PENDING 09/05/2019    Lab Results  Component Value Date   CREATININE 1.39 (H) 09/05/2019   BUN 30 (H) 09/05/2019   NA 137 09/05/2019   K 4.5 09/05/2019   CL 108 09/05/2019   CO2 19 (L) 09/05/2019    Lab Results  Component Value Date   ALT 99 (H) 09/05/2019   AST 71 (H) 09/05/2019   ALKPHOS 505 (H) 09/05/2019   BILITOT 1.8 (H) 09/05/2019     Microbiology: Recent Results (from the past 240 hour(s))  Culture, blood (routine x 2)     Status: None (Preliminary result)   Collection Time: 09/03/19  1:46  AM   Specimen: BLOOD RIGHT HAND  Result Value Ref Range Status   Specimen Description BLOOD RIGHT HAND  Final   Special Requests   Final    BOTTLES DRAWN AEROBIC AND ANAEROBIC Blood Culture results may not be optimal due to an inadequate volume of blood received in culture bottles   Culture   Final    NO GROWTH 1 DAY Performed at [facility], [place] [address] [place], [place] [address]    Report Status PENDING  Incomplete  Culture, blood (routine x 2)     Status: None (Preliminary result)   Collection Time: 09/03/19  2:40 AM   Specimen: BLOOD  Result Value Ref Range Status   Specimen Description BLOOD SITE NOT SPECIFIED  Final   Special Requests   Final    BOTTLES DRAWN AEROBIC AND ANAEROBIC Blood Culture results may not be optimal due to an inadequate volume of blood received in culture bottles   Culture   Final    NO GROWTH 1 DAY Performed at [facility], [place] [address], [place], [place] [address]    Report Status PENDING  Incomplete  SARS Coronavirus 2 by RT PCR (hospital order, performed in [facility] hospital lab) Nasopharyngeal Nasopharyngeal Swab     Status: None   Collection Time: 09/03/19  3:00 AM   Specimen: Nasopharyngeal Swab  Result Value Ref Range Status   SARS Coronavirus 2 NEGATIVE NEGATIVE Final    Comment: (NOTE) SARS-CoV-2 target nucleic acids are NOT DETECTED.  The SARS-CoV-2 RNA is generally detectable in upper and lower respiratory specimens during the acute phase of infection. The lowest concentration of SARS-CoV-2 viral copies this assay can detect is 250 copies / mL. A negative result does not preclude SARS-CoV-2 infection and should not be used as the sole basis for treatment or other patient management decisions.  A negative result may occur with improper specimen collection / handling, submission of specimen other than nasopharyngeal swab, presence of viral mutation(s) within the areas targeted by this assay, and inadequate number of  viral copies (<250 copies / mL). A negative result must be combined with clinical observations, patient history, and epidemiological information.  Fact Sheet for Patients:   StrictlyIdeas.no  Fact Sheet for Healthcare Providers: BankingDealers.co.za  This test is not yet approved or  cleared by the [place] FDA and has been authorized for detection and/or diagnosis of SARS-CoV-2 by FDA under an Emergency Use Authorization (EUA).  This EUA  will remain in effect (meaning this test can be used) for the duration of the COVID-19 declaration under Section 564(b)(1) of the Act, 21 U.S.C. section 360bbb-3(b)(1), unless the authorization is terminated or revoked sooner.  Performed at [facility], [place] [address], [place], [place] [address]      Terri Piedra, [facility]  09/05/2019  8:20 AM

## 2019-09-05 NOTE — Progress Notes (Signed)
Pulled 51m of IV Lasix for a different pt and a Pyxis error occurred for this pt MAR. EMAR shows that 171mof IV Lasix was pulled for this pt instead of the other pt. The Pyxis did not give me an option on either pt to return.  Pharmacist was called (RNicole Cellaand advised me to call TI ticket [phone]

## 2019-09-06 ENCOUNTER — Inpatient Hospital Stay (HOSPITAL_COMMUNITY): Payer: Medicare Other

## 2019-09-06 DIAGNOSIS — R7989 Other specified abnormal findings of blood chemistry: Secondary | ICD-10-CM | POA: Diagnosis not present

## 2019-09-06 DIAGNOSIS — J189 Pneumonia, unspecified organism: Secondary | ICD-10-CM | POA: Diagnosis not present

## 2019-09-06 DIAGNOSIS — M79603 Pain in arm, unspecified: Secondary | ICD-10-CM | POA: Diagnosis not present

## 2019-09-06 DIAGNOSIS — J45909 Unspecified asthma, uncomplicated: Secondary | ICD-10-CM | POA: Diagnosis not present

## 2019-09-06 DIAGNOSIS — I10 Essential (primary) hypertension: Secondary | ICD-10-CM | POA: Diagnosis not present

## 2019-09-06 LAB — CBC
HCT: 23.8 % — ABNORMAL LOW (ref 36.0–46.0)
HCT: 29 % — ABNORMAL LOW (ref 36.0–46.0)
Hemoglobin: 7.7 g/dL — ABNORMAL LOW (ref 12.0–15.0)
Hemoglobin: 8.9 g/dL — ABNORMAL LOW (ref 12.0–15.0)
MCH: 30.3 pg (ref 26.0–34.0)
MCH: 29.5 pg (ref 26.0–34.0)
MCHC: 32.4 g/dL (ref 30.0–36.0)
MCHC: 30.7 g/dL (ref 30.0–36.0)
MCV: 93.7 fL (ref 80.0–100.0)
MCV: 96 fL (ref 80.0–100.0)
Platelets: 236 10*3/uL (ref 150–400)
Platelets: 261 10*3/uL (ref 150–400)
RBC: 2.54 MIL/uL — ABNORMAL LOW (ref 3.87–5.11)
RBC: 3.02 MIL/uL — ABNORMAL LOW (ref 3.87–5.11)
RDW: 16.7 % — ABNORMAL HIGH (ref 11.5–15.5)
RDW: 16.8 % — ABNORMAL HIGH (ref 11.5–15.5)
WBC: 11.8 10*3/uL — ABNORMAL HIGH (ref 4.0–10.5)
WBC: 9.9 10*3/uL (ref 4.0–10.5)
nRBC: 0 % (ref 0.0–0.2)
nRBC: 0 % (ref 0.0–0.2)

## 2019-09-06 LAB — GLUCOSE, CAPILLARY
Glucose-Capillary: 43 mg/dL — CL (ref 70–99)
Glucose-Capillary: 81 mg/dL (ref 70–99)
Glucose-Capillary: 142 mg/dL — ABNORMAL HIGH (ref 70–99)
Glucose-Capillary: 163 mg/dL — ABNORMAL HIGH (ref 70–99)
Glucose-Capillary: 149 mg/dL — ABNORMAL HIGH (ref 70–99)

## 2019-09-06 LAB — TYPE AND SCREEN
ABO/RH(D): O NEG
Antibody Screen: NEGATIVE

## 2019-09-06 LAB — URINE CULTURE: Culture: NO GROWTH

## 2019-09-06 LAB — COMPREHENSIVE METABOLIC PANEL
ALT: 79 U/L — ABNORMAL HIGH (ref 0–44)
AST: 36 U/L (ref 15–41)
Albumin: 1.8 g/dL — ABNORMAL LOW (ref 3.5–5.0)
Alkaline Phosphatase: 493 U/L — ABNORMAL HIGH (ref 38–126)
Anion gap: 8 (ref 5–15)
BUN: 23 mg/dL (ref 8–23)
CO2: 20 mmol/L — ABNORMAL LOW (ref 22–32)
Calcium: 8.5 mg/dL — ABNORMAL LOW (ref 8.9–10.3)
Chloride: 111 mmol/L (ref 98–111)
Creatinine, Ser: 0.99 mg/dL (ref 0.44–1.00)
GFR calc Af Amer: >60 mL/min (ref >60)
GFR calc non Af Amer: 58 mL/min — ABNORMAL LOW (ref >60)
Glucose, Bld: 129 mg/dL — ABNORMAL HIGH (ref 70–99)
Potassium: 3.9 mmol/L (ref 3.5–5.1)
Sodium: 139 mmol/L (ref 135–145)
Total Bilirubin: 1.4 mg/dL — ABNORMAL HIGH (ref 0.3–1.2)
Total Protein: 4.4 g/dL — ABNORMAL LOW (ref 6.5–8.1)

## 2019-09-06 LAB — MAGNESIUM: Magnesium: 1.3 mg/dL — ABNORMAL LOW (ref 1.7–2.4)

## 2019-09-06 LAB — ABO/RH: ABO/RH(D): O NEG

## 2019-09-06 MED ORDER — MAGNESIUM OXIDE 400 (241.3 MG) MG PO TABS
400.0000 mg | ORAL_TABLET | Freq: Two times a day (BID) | ORAL | Status: DC
  Administered 2019-09-06 – 2019-09-09 (×7): 400 mg via ORAL
  Filled 2019-09-06 (×6): qty 1

## 2019-09-06 MED ORDER — MAGNESIUM SULFATE 4 GM/100ML IV SOLN
4.0000 g | Freq: Once | INTRAVENOUS | Status: AC
  Administered 2019-09-06: 4 g via INTRAVENOUS
  Filled 2019-09-06: qty 100

## 2019-09-06 MED ORDER — INSULIN GLARGINE 100 UNIT/ML ~~LOC~~ SOLN
20.0000 [IU] | Freq: Every day | SUBCUTANEOUS | Status: DC
  Administered 2019-09-06 – 2019-09-07 (×2): 20 [IU] via SUBCUTANEOUS
  Filled 2019-09-06 (×3): qty 0.2

## 2019-09-06 NOTE — Progress Notes (Signed)
VASCULAR LAB    Right upper extremity venous duplex completed.    Preliminary report:  See CV proc for preliminary results.  Lealand Elting, RVT 09/06/2019, 5:30 PM

## 2019-09-06 NOTE — Progress Notes (Addendum)
PROGRESS NOTE  Heather Petty QJJ:[id number] DOB: 01-Nov-1950 DOA: 09/02/2019 PCP: System, Pcp Not In   LOS: 3 days   Brief narrative: As per HPI,  Heather Petty is a 69 y.o. female with history of diabetes mellitus type 2, hypertension, gout, asthma who was recently admitted to the hospital for MRSA and enterococcal bacteremia patient completed her antibiotic course of 6 weeks 2 days ago and her PICC line was removed also was recently admitted for anemia requiring blood transfusion and gastroenterology was planning outpatient colonoscopy also was found to have elevated LFTs during the recent admissions work-up so far was negative presents to the ER from nursing home after patient was having shortness of breath with productive cough for the last 3 days.  Has some epigastric discomfort no nausea vomiting also has some chest pressure off and on.  In the ER, patient with temperature of 101.7 F lactic acid was normal WBC count was 10 hemoglobin 10 EKG was showing normal sinus rhythm. Covid test was negative chest x-ray showing bilateral infiltrates concerning for multifocal pneumonia.  Patient's labs also showed markedly elevated alkaline phosphatase at 1055 which is increased from her recent past.  Total bilirubin of 2.1 AST was 254 ALT 214.  Patient was started on empiric antibiotics admitted for pneumonia which is multifocal with markedly elevated LFTs.  Ultrasonogram of the abdomen shows gallbladder sludge.  No definite evidence of any cholecystitis.  Assessment/Plan:  Principal Problem:   CAP (community acquired pneumonia) Active Problems:   Uncontrolled type 2 diabetes mellitus with hyperglycemia (HCC)   Elevated LFTs   Essential hypertension   Gout   Asthma   Anemia   Pneumonia  Community-acquired pneumonia. Multifocal pneumonia with acute hypoxic respiratory failure.. Likely bacterial pneumonia. Procal 0.4>0.5.   No leukocytosis.  COVID-19 was negative.  Lactate of 1.4.  BNP at 59.3.   2D echocardiogram with LV function of 60 to 65%. Await speech and swallow evaluation. Blood culture negative in one day.  Patient received vancomycin and cefepime.  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  There was some concern for allergic reaction so has been changed to linezolid.  Currently on 2 L of nasal cannula.  Will wean as possible.  Doxepin has been discontinued for linezolid.  Patient has been seen by speech therapy for swallow evaluation.  Patient is okay for regular consistency diet with thin liquids.  Mild aspiration risk.  Fever, hypotension concern for sepsis.  Cultures negative so far.  Lactate was within normal range.  Patient received IV fluid boluses during this admission.  T-max of 98.7 F..  Continue antibiotics.  Follow-up cultures.  Leukocytosis starting to trend down.   Elevated LFTs Patient has had recent work-up including MRI and hepatic serologies which were unremarkable.  GI was supposed to follow up as outpatient which has been scheduled from [facility] place .  LFTs are trending down.  Recent admission for enterococcal and MRSA bacteremia just finished 6 weeks course of IV antibiotics 2 days ago and PICC line was removed.  ID on board.   WBC of 13.3>11.8  Diabetes mellitus type 2.  Last known hemoglobin A1c of 10.4 on June 2021.  Continue Lantus, sliding scale insulin, Accu-Cheks diabetic diet.    Will decrease the dose of Lantus to 20 units from 25 units at nighttime due to mild hypoglycemia this morning.  History of gout on allopurinol.  Essential  hypertension on nadolol and amlodipine.  Will closely monitor .  Will  refer systolic blood pressure less than 100  History of asthma.   Continue steroid neb, albuterol. Wean oxygen as able.  Chronic simple anemia.  History of blood transfusion.  Hemoglobin of 9.1 today.  Monitor CBC closely.  Mild hypokalemia. replenished.  Potassium of 3.9 today.  Mild hypomagnesemia.  Magnesium of 1.3  today.  Will place magnesium through IV and orally.  History of schizophrenia. Used to follow with psychiatry in the past. On risperidone. Encouraged psychiatry follow up as outpatient.  Right shoulder/arm pain.  Patient states that she had a fall in the past.  Will get x-ray of the right shoulder.  Had PICC line on the right upper extremity so we will get ultrasound of the right upper extremity to rule out DVT.  DVT prophylaxis: SCDs Start: 09/03/19 4193  Code Status:  Full code  Family Communication: None today.  Status is: Inpatient  Remains inpatient appropriate because: Inpatient level of care appropriate due to severity of illness , awaiting for skilled nursing facility placement,  Dispo:  Patient From: [facility]  Planned Disposition: [facility]  Expected discharge date: 09/07/19  Medically stable for discharge: No  Consultants:  Infectious disease  Procedures:  none  Antibiotics:  . Linezolid . cefepime  Subjective:  Today, has  mild right shoulder pain.  Has less shortness of breath and dyspnea.  Has less cough.  Had few bowel movements yesterday and today..  Objective: Vitals:   09/06/19 0111 09/06/19 0315  BP: (!) 100/46   Pulse:  70  Resp: 19 20  Temp:  98.1 F (36.7 C)  SpO2: 98% 99%    Intake/Output Summary (Last 24 hours) at 09/06/2019 7902 Last data filed at 09/05/2019 1315 Gross per 24 hour  Intake 100 ml  Output --  Net 100 ml   There were no vitals filed for this visit. There is no height or weight on file to calculate BMI.   Physical Exam:  GENERAL: Alert awake and communicative.  Not in obvious distress.  On nasal cannula oxygen.  Mildly anxious.  Obese HENT: No scleral pallor or icterus. Pupils equally reactive to light. Oral mucosa is moist NECK: is supple, no gross swelling noted. CHEST: Diminished breath sounds bilateral.  Coarse breath sounds noted bilaterally.   CVS: S1 and S2 heard, no murmur.  Regular rate and rhythm.  ABDOMEN: Soft, non-tender, bowel sounds are present. EXTREMITIES: No edema.  Right shoulder with nonspecific tenderness on palpation.  No erythema CNS: Cranial nerves are intact. No focal motor deficits. SKIN: warm and dry without rashes.  Data Review: I have personally reviewed the following laboratory data and studies,  CBC: Recent Labs  Lab 09/02/19 1917 09/02/19 1917 09/03/19 0742 09/04/19 0142 09/04/19 2056 09/05/19 0657 09/06/19 0148  WBC 10.0   < > 7.4 8.1 17.3* 13.3* 11.8*  NEUTROABS 6.5  --  4.0  --  14.7*  --   --   HGB 10.0*   < > 8.3* 8.8* 9.6* 9.1* 7.7*  HCT 32.4*   < > 26.7* 27.2* 30.5* 28.9* 23.8*  MCV 95.0   < > 94.3 94.8 95.6 94.8 93.7  PLT 255   < > 225 253 284 255 236   < > = values in this interval not displayed.   Basic Metabolic Panel: Recent Labs  Lab 09/03/19 0742 09/04/19 0142 09/04/19 2056 09/05/19 0657 09/06/19 0148  NA 138 137 138 137 139  K 3.4* 4.8 4.2 4.5 3.9  CL 101 105 110  108 111  CO2 27 22 17* 19* 20*  GLUCOSE 126* 225* 133* 168* 129*  BUN 19 24* 27* 30* 23  CREATININE 0.85 0.90 1.40* 1.39* 0.99  CALCIUM 9.2 8.8* 8.3* 8.5* 8.5*  MG  --  1.7  --  1.2* 1.3*  PHOS  --   --   --  3.8  --    Liver Function Tests: Recent Labs  Lab 09/03/19 0742 09/04/19 0142 09/04/19 2056 09/05/19 0657 09/06/19 0148  AST 89* 81* 61* 71* 36  ALT 142* 117* 90* 99* 79*  ALKPHOS 806* 705* 572* 505* 493*  BILITOT 1.7* 1.5* 1.8* 1.8* 1.4*  PROT 5.7* 5.4* 4.8* 4.7* 4.4*  ALBUMIN 2.3* 2.2* 2.0* 1.9* 1.8*   Recent Labs  Lab 09/03/19 0742  LIPASE 25   No results for input(s): AMMONIA in the last 168 hours. Cardiac Enzymes: No results for input(s): CKTOTAL, CKMB, CKMBINDEX, TROPONINI in the last 168 hours. BNP (last 3 results) Recent Labs    09/03/19 1520 09/04/19 0142  BNP 46.3 59.3    ProBNP (last 3 results) No results for input(s): PROBNP in the last 8760 hours.  CBG: Recent Labs  Lab 09/05/19 0831  09/05/19 1158 09/05/19 1642 09/05/19 2101 09/06/19 0721  GLUCAP 162* 199* 214* 189* 43*   Recent Results (from the past 240 hour(s))  Culture, blood (routine x 2)     Status: None (Preliminary result)   Collection Time: 09/03/19  1:46 AM   Specimen: BLOOD RIGHT HAND  Result Value Ref Range Status   Specimen Description BLOOD RIGHT HAND  Final   Special Requests   Final    BOTTLES DRAWN AEROBIC AND ANAEROBIC Blood Culture results may not be optimal due to an inadequate volume of blood received in culture bottles   Culture   Final    NO GROWTH 2 DAYS Performed at [facility], [place] [address] [place], [place] [address]    Report Status PENDING  Incomplete  Culture, blood (routine x 2)     Status: None (Preliminary result)   Collection Time: 09/03/19  2:40 AM   Specimen: BLOOD  Result Value Ref Range Status   Specimen Description BLOOD SITE NOT SPECIFIED  Final   Special Requests   Final    BOTTLES DRAWN AEROBIC AND ANAEROBIC Blood Culture results may not be optimal due to an inadequate volume of blood received in culture bottles   Culture   Final    NO GROWTH 2 DAYS Performed at [facility], [place] [address] [place], [place] [address]    Report Status PENDING  Incomplete  SARS Coronavirus 2 by RT PCR (hospital order, performed in [facility] hospital lab) Nasopharyngeal Nasopharyngeal Swab     Status: None   Collection Time: 09/03/19  3:00 AM   Specimen: Nasopharyngeal Swab  Result Value Ref Range Status   SARS Coronavirus 2 NEGATIVE NEGATIVE Final    Comment: (NOTE) SARS-CoV-2 target nucleic acids are NOT DETECTED.  The SARS-CoV-2 RNA is generally detectable in upper and lower respiratory specimens during the acute phase of infection. The lowest concentration of SARS-CoV-2 viral copies this assay can detect is 250 copies / mL. A negative result does not preclude SARS-CoV-2 infection and should not be used as the sole basis for treatment or other patient  management decisions.  A negative result may occur with improper specimen collection / handling, submission of specimen other than nasopharyngeal swab, presence of viral mutation(s) within the areas targeted by this  assay, and inadequate number of viral copies (<250 copies / mL). A negative result must be combined with clinical observations, patient history, and epidemiological information.  Fact Sheet for Patients:   StrictlyIdeas.no  Fact Sheet for Healthcare Providers: BankingDealers.co.za  This test is not yet approved or  cleared by the [place] FDA and has been authorized for detection and/or diagnosis of SARS-CoV-2 by FDA under an Emergency Use Authorization (EUA).  This EUA will remain in effect (meaning this test can be used) for the duration of the COVID-19 declaration under Section 564(b)(1) of the Act, 21 U.S.C. section 360bbb-3(b)(1), unless the authorization is terminated or revoked sooner.  Performed at [facility], [place] [address], [place], [place] [address]   Culture, Urine     Status: None   Collection Time: 09/05/19  5:03 AM   Specimen: Urine, Clean Catch  Result Value Ref Range Status   Specimen Description URINE, CLEAN CATCH  Final   Special Requests NONE  Final   Culture   Final    NO GROWTH Performed at [facility], [place] [address], [place], [place] [address]    Report Status 09/06/2019 FINAL  Final     Studies: DG Chest 1 View  Result Date: 09/04/2019 CLINICAL DATA:  Sepsis EXAM: CHEST  1 VIEW COMPARISON:  09/02/2019, 08/10/2019, 07/23/2019 FINDINGS: Persistent opacity in the right upper lobe adjacent to the pulmonary fissure. Minimal patchy peripheral opacity at the left base without change. Mild cardiomegaly with aortic atherosclerosis. No pneumothorax. IMPRESSION: Similar appearance of right upper lobe and left basilar pulmonary opacity, possible pneumonia. Mild cardiomegaly.  Electronically Signed   By: Donavan Foil M.D.   On: 09/04/2019 21:39   DG Swallowing Func-Speech Pathology  Result Date: 09/05/2019 Objective Swallowing Evaluation: Type of Study: Bedside Swallow Evaluation  Patient Details Name: Heather Petty MRN: [id number] Date of Birth: 03-May-1950 Today's Date: 09/05/2019 Time: SLP Start Time (ACUTE ONLY): 1100 -SLP Stop Time (ACUTE ONLY): 1130 SLP Time Calculation (min) (ACUTE ONLY): 30 min Past Medical History: Past Medical History: Diagnosis Date . Diabetes mellitus without complication ([place])  . Hypertension  Past Surgical History: Past Surgical History: Procedure Laterality Date . TONSILLECTOMY   HPI: Larcenia Holaday is a 69 y.o. female with history of diabetes mellitus type 2, hypertension, gout, asthma who was recently admitted to the hospital for MRSA and enterococcal bacteremia.  She completed her antibiotic course of 6 weeks 2 days ago and her PICC line was removed.  She was also recently admitted for anemia requiring blood transfusion and gastroenterology was planning outpatient colonoscopy.  She was also found to have elevated LFTs during the recent admissions with  work-up so far being negative.  She presents to the ER from nursing home adue to shortness of breath with productive cough for the last 3 days.  She also some epigastric but no discomfort, nausea or vomiting.  She also reported some chest pressure off and on. Chest xray was showing increased peripheral RUL ground glass opacity with possible small foci of peripheral airspace disease at the left base, findings are suspicious for multifocal PNA - possibly atypical vs viral.  She was diagnosed with community acquired PNA.   Subjective: The patient was seen in radiology for MBS.  Assessment / Plan / Recommendation CHL IP CLINICAL IMPRESSIONS 09/05/2019 Clinical Impression MBS was completed using thin liquids via spoon, cup and straw, pureed material and solids.  The patient was able to self feed during  this exam.  Her  oral and pharyngeal swallow were functional.  Mastication of dry solids was adequate and no residue was seen post swallow.  Intermittent transient penetration was seen given thin liquids.  This material was noted to completely clear the laryngeal vestibule.  No pharyngeal residue was seen.  Esophageal sweep did reveal it slow to clear raising suspicion for esophageal dysphagia.  She was encouraged to alternate food/liquids and remain upright for 30-60 minutes after meals.  Recommend a regular diet with thin liquids.  ST follow up is not indicated.  She may benefit from a GI consult.   SLP Visit Diagnosis Dysphagia, unspecified (R13.10) Attention and concentration deficit following -- Frontal lobe and executive function deficit following -- Impact on safety and function Mild aspiration risk   CHL IP TREATMENT RECOMMENDATION 09/05/2019 Treatment Recommendations No treatment recommended at this time   No flowsheet data found. CHL IP DIET RECOMMENDATION 09/05/2019 SLP Diet Recommendations Thin liquid;Regular solids Liquid Administration via Straw;Cup Medication Administration Whole meds with liquid Compensations Follow solids with liquid Postural Changes Seated upright at 90 degrees;Remain semi-upright after after feeds/meals (Comment)   CHL IP OTHER RECOMMENDATIONS 09/05/2019 Recommended Consults Consider GI evaluation Oral Care Recommendations Oral care BID Other Recommendations --   CHL IP FOLLOW UP RECOMMENDATIONS 09/05/2019 Follow up Recommendations None   No flowsheet data found.     No flowsheet data found. No flowsheet data found. No flowsheet data found. Shelly Flatten, MA, CCC-SLP Acute Rehab SLP [phone] [name] 09/05/2019, 11:58 AM              ECHOCARDIOGRAM COMPLETE  Result Date: 09/04/2019    ECHOCARDIOGRAM REPORT   Patient Name:   Heather Petty Date of Exam: 09/04/2019 Medical Rec #:  [id number]        Height:       66.0 in Accession #:    [id number]       Weight:       160.0  lb Date of Birth:  March 12, 1950        BSA:          1.819 m Patient Age:    62 years         BP:           117/48 mmHg Patient Gender: F                HR:           86 bpm. Exam Location:  Inpatient Procedure: 2D Echo Indications:    Dyspnea R06.00  History:        Patient has no prior history of Echocardiogram examinations.                 Risk Factors:Hypertension and Diabetes.  Sonographer:    Mikki Santee RDCS (AE) Referring Phys: [id number] [place]  1. Left ventricular ejection fraction, by estimation, is 60 to 65%. The left ventricle has normal function. The left ventricle has no regional wall motion abnormalities. Left ventricular diastolic parameters were normal.  2. Right ventricular systolic function is normal. The right ventricular size is normal. Tricuspid regurgitation signal is inadequate for assessing PA pressure.  3. The mitral valve is normal in structure. No evidence of mitral valve regurgitation.  4. The aortic valve was not well visualized. Aortic valve regurgitation is not visualized. No aortic stenosis is present.  5. The inferior vena cava is normal in size with greater than 50% respiratory variability, suggesting right atrial pressure of 3 mmHg. FINDINGS  Left  Ventricle: Left ventricular ejection fraction, by estimation, is 60 to 65%. The left ventricle has normal function. The left ventricle has no regional wall motion abnormalities. The left ventricular internal cavity size was normal in size. There is  no left ventricular hypertrophy. Left ventricular diastolic parameters were normal. Right Ventricle: The right ventricular size is normal. Right vetricular wall thickness was not assessed. Right ventricular systolic function is normal. Tricuspid regurgitation signal is inadequate for assessing PA pressure. Left Atrium: Left atrial size was normal in size. Right Atrium: Right atrial size was normal in size. Pericardium: There is no evidence of pericardial effusion.  Presence of pericardial fat pad. Mitral Valve: The mitral valve is normal in structure. No evidence of mitral valve regurgitation. Tricuspid Valve: The tricuspid valve is grossly normal. Tricuspid valve regurgitation is trivial. Aortic Valve: The aortic valve was not well visualized. Aortic valve regurgitation is not visualized. No aortic stenosis is present. Pulmonic Valve: The pulmonic valve was not well visualized. Pulmonic valve regurgitation is not visualized. Aorta: The aortic root and ascending aorta are structurally normal, with no evidence of dilitation. Venous: The inferior vena cava is normal in size with greater than 50% respiratory variability, suggesting right atrial pressure of 3 mmHg. IAS/Shunts: No atrial level shunt detected by color flow Doppler.  LEFT VENTRICLE PLAX 2D LVIDd:         4.00 cm  Diastology LVIDs:         2.50 cm  LV e' lateral:   9.79 cm/s LV PW:         0.80 cm  LV E/e' lateral: 10.3 LV IVS:        0.90 cm  LV e' medial:    7.83 cm/s LVOT diam:     1.80 cm  LV E/e' medial:  12.9 LV SV:         72 LV SV Index:   40 LVOT Area:     2.54 cm  RIGHT VENTRICLE RV S prime:     18.00 cm/s TAPSE (M-mode): 1.9 cm LEFT ATRIUM           Index       RIGHT ATRIUM           Index LA diam:      3.00 cm 1.65 cm/m  RA Area:     10.90 cm LA Vol (A2C): 23.8 ml 13.08 ml/m RA Volume:   21.90 ml  12.04 ml/m LA Vol (A4C): 31.5 ml 17.32 ml/m  AORTIC VALVE LVOT Vmax:   140.00 cm/s LVOT Vmean:  89.900 cm/s LVOT VTI:    0.283 m  AORTA Ao Root diam: 2.40 cm MITRAL VALVE MV Area (PHT): 2.62 cm     SHUNTS MV Decel Time: 289 msec     Systemic VTI:  0.28 m MV E velocity: 101.00 cm/s  Systemic Diam: 1.80 cm MV A velocity: 116.00 cm/s MV E/A ratio:  0.87 Oswaldo Milian MD Electronically signed by Oswaldo Milian MD Signature Date/Time: 09/04/2019/12:07:45 PM    Final       Flora Lipps, MD  [facility] 09/06/2019

## 2019-09-06 NOTE — Progress Notes (Signed)
Received order for midline. Upon assessment by 2 IV RNs, no suitable veins found in upper or lower arms bilaterally. In the left upper arm, brachial vein ~3cm deep (too deep for midline placement). 22G 1.75" PIV obtained in LAFA. RN made aware.

## 2019-09-07 DIAGNOSIS — I10 Essential (primary) hypertension: Secondary | ICD-10-CM | POA: Diagnosis not present

## 2019-09-07 DIAGNOSIS — R7989 Other specified abnormal findings of blood chemistry: Secondary | ICD-10-CM | POA: Diagnosis not present

## 2019-09-07 DIAGNOSIS — J45909 Unspecified asthma, uncomplicated: Secondary | ICD-10-CM | POA: Diagnosis not present

## 2019-09-07 DIAGNOSIS — J189 Pneumonia, unspecified organism: Secondary | ICD-10-CM | POA: Diagnosis not present

## 2019-09-07 LAB — BASIC METABOLIC PANEL
Anion gap: 9 (ref 5–15)
BUN: 16 mg/dL (ref 8–23)
CO2: 22 mmol/L (ref 22–32)
Calcium: 8.8 mg/dL — ABNORMAL LOW (ref 8.9–10.3)
Chloride: 109 mmol/L (ref 98–111)
Creatinine, Ser: 0.78 mg/dL (ref 0.44–1.00)
GFR calc Af Amer: >60 mL/min (ref >60)
GFR calc non Af Amer: >60 mL/min (ref >60)
Glucose, Bld: 73 mg/dL (ref 70–99)
Potassium: 3.9 mmol/L (ref 3.5–5.1)
Sodium: 140 mmol/L (ref 135–145)

## 2019-09-07 LAB — CBC
HCT: 26.6 % — ABNORMAL LOW (ref 36.0–46.0)
Hemoglobin: 8.2 g/dL — ABNORMAL LOW (ref 12.0–15.0)
MCH: 29.2 pg (ref 26.0–34.0)
MCHC: 30.8 g/dL (ref 30.0–36.0)
MCV: 94.7 fL (ref 80.0–100.0)
Platelets: 260 10*3/uL (ref 150–400)
RBC: 2.81 MIL/uL — ABNORMAL LOW (ref 3.87–5.11)
RDW: 16.8 % — ABNORMAL HIGH (ref 11.5–15.5)
WBC: 10 10*3/uL (ref 4.0–10.5)
nRBC: 0 % (ref 0.0–0.2)

## 2019-09-07 LAB — GLUCOSE, CAPILLARY
Glucose-Capillary: 49 mg/dL — ABNORMAL LOW (ref 70–99)
Glucose-Capillary: 107 mg/dL — ABNORMAL HIGH (ref 70–99)
Glucose-Capillary: 172 mg/dL — ABNORMAL HIGH (ref 70–99)
Glucose-Capillary: 158 mg/dL — ABNORMAL HIGH (ref 70–99)

## 2019-09-07 MED ORDER — DICLOFENAC SODIUM 1 % EX GEL
2.0000 g | Freq: Four times a day (QID) | CUTANEOUS | Status: DC
  Administered 2019-09-07 – 2019-09-09 (×8): 2 g via TOPICAL
  Filled 2019-09-07: qty 100

## 2019-09-07 MED ORDER — TRAMADOL HCL 50 MG PO TABS
50.0000 mg | ORAL_TABLET | Freq: Four times a day (QID) | ORAL | Status: DC | PRN
  Administered 2019-09-07 – 2019-09-09 (×3): 50 mg via ORAL
  Filled 2019-09-07 (×3): qty 1

## 2019-09-07 NOTE — Progress Notes (Signed)
PROGRESS NOTE  Heather Petty EHM:[id number] DOB: Jan 02, 1951 DOA: 09/02/2019 PCP: System, Pcp Not In   LOS: 4 days   Brief narrative: As per HPI,  Heather Petty is a 69 y.o. female with history of diabetes mellitus type 2, hypertension, gout, asthma who was recently admitted to the hospital for MRSA and enterococcal bacteremia patient completed her antibiotic course of 6 weeks 2 days ago and her PICC line was removed also was recently admitted for anemia requiring blood transfusion and gastroenterology was planning outpatient colonoscopy also was found to have elevated LFTs during the recent admissions work-up so far was negative presents to the ER from nursing home after patient was having shortness of breath with productive cough for the last 3 days.  Has some epigastric discomfort no nausea vomiting also has some chest pressure off and on.  In the ER, patient with temperature of 101.7 F lactic acid was normal WBC count was 10 hemoglobin 10 EKG was showing normal sinus rhythm. Covid test was negative chest x-ray showing bilateral infiltrates concerning for multifocal pneumonia.  Patient's labs also showed markedly elevated alkaline phosphatase at 1055 which is increased from her recent past.  Total bilirubin of 2.1 AST was 254 ALT 214.  Patient was started on empiric antibiotics admitted for pneumonia which is multifocal with markedly elevated LFTs.  Ultrasonogram of the abdomen shows gallbladder sludge.  No definite evidence of any cholecystitis.  Assessment/Plan:  Principal Problem:   CAP (community acquired pneumonia) Active Problems:   Uncontrolled type 2 diabetes mellitus with hyperglycemia (HCC)   Elevated LFTs   Essential hypertension   Gout   Asthma   Anemia   Pneumonia  Community-acquired pneumonia. Multifocal pneumonia with acute hypoxic respiratory failure.. Likely bacterial pneumonia. Procal 0.4>0.5.   No leukocytosis.  COVID-19 was negative.   BNP at 59.3.  2D  echocardiogram with LV function of 60 to 65%. Blood culture negative so far  On linezolid and cefepime. 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  There was some concern for allergic reaction so vancomycin was changed to linezolid.   Doxepin has been discontinued for linezolid.  Seen by speech therapy and recommend regular consistency diet with thin liquids.  Mild aspiration risk.  Fever, hypotension..  Blood cultures negative so far.  Lactate was within normal range.  Patient received IV fluid boluses during this admission.  T-max of 98.1 F.  Continue antibiotics.    Leukocytosis normalized.   Elevated LFTs Patient has had recent work-up including MRI and hepatic serologies which were unremarkable.  GI was supposed to follow up as outpatient which has been scheduled from [facility] place .  LFTs are trending down.  Recent admission for enterococcal and MRSA bacteremia just finished 6 weeks course of IV antibiotics 2 days prior to admission and PICC line was removed.  ID on board.   WBC of 13.3>11.8.  Blood cultures negative this admission.  Diabetes mellitus type 2.  Last known hemoglobin A1c of 10.4 on June 2021.  Continue Lantus, sliding scale insulin, Accu-Cheks diabetic diet.    On decreased dose of Lantus to 20 units from 25 units at nighttime.  POC glucose of 149  History of gout on allopurinol.  Essential  hypertension on nadolol and amlodipine.  Will closely monitor .    History of asthma.   Continue steroid neb, albuterol. Wean oxygen as able.  Chronic simple anemia.  History of blood transfusion.  Hemoglobin of 8.2 today.  Monitor CBC closely.  Mild hypokalemia. replenished.  Potassium of 3.9 today.  Mild hypomagnesemia.  Replenished. Check in am.  History of schizophrenia. Used to follow with psychiatry in the past. On risperidone. Encouraged psychiatry follow up as outpatient.  Right shoulder/arm pain.  History of recent fall. .  Negative x-ray of the right shoulder for  fracture. Ultrasound of the right upper extremity was negative for DVT.  DVT prophylaxis: SCDs Start: 09/03/19 7616  Code Status:  Full code  Family Communication: None today.  Status is: Inpatient  Remains inpatient appropriate because: Inpatient level of care appropriate due to severity of illness , awaiting for skilled nursing facility placement,  Dispo:  Patient From: [place]  Planned Disposition: [place]  Expected discharge date: 1-2 days  Medically stable for discharge: No  Consultants:  Infectious disease  Procedures:  none  Antibiotics:  . Linezolid . cefepime  Subjective:  Today, patient still complains of right shoulder pain.  Denies overt dyspnea nausea vomiting has generalized weakness.  Objective: Vitals:   09/06/19 2333 09/07/19 0405  BP: (!) 113/49 (!) 122/61  Pulse: 65 62  Resp: 20 18  Temp: 98.1 F (36.7 C) 98 F (36.7 C)  SpO2: 98% 97%    Intake/Output Summary (Last 24 hours) at 09/07/2019 0731 Last data filed at 09/07/2019 0600 Gross per 24 hour  Intake 709.07 ml  Output 1850 ml  Net -1140.93 ml   There were no vitals filed for this visit. There is no height or weight on file to calculate BMI.   Physical Exam:  GENERAL: Alert awake and communicative.  Not in obvious distress.  On nasal cannula oxygen.  Mildly anxious.  Obese HENT: No scleral pallor or icterus. Pupils equally reactive to light. Oral mucosa is moist NECK: is supple, no gross swelling noted. CHEST: Diminished breath sounds bilaterally CVS: S1 and S2 heard, no murmur. Regular rate and rhythm.  ABDOMEN: Soft, non-tender, bowel sounds are present. EXTREMITIES: No edema.  Right shoulder with nonspecific tenderness on palpation.  No erythema CNS: Cranial nerves are intact. No focal motor deficits. SKIN: warm and dry without rashes.  Data Review: I have personally reviewed the following laboratory data and studies,  CBC: Recent Labs  Lab  2019-09-21 1917 09/21/19 1917 09/03/19 0742 09/04/19 0142 09/04/19 2056 09/05/19 0657 09/06/19 0148 09/06/19 1525 09/07/19 0210  WBC 10.0   < > 7.4   < > 17.3* 13.3* 11.8* 9.9 10.0  NEUTROABS 6.5  --  4.0  --  14.7*  --   --   --   --   HGB 10.0*   < > 8.3*   < > 9.6* 9.1* 7.7* 8.9* 8.2*  HCT 32.4*   < > 26.7*   < > 30.5* 28.9* 23.8* 29.0* 26.6*  MCV 95.0   < > 94.3   < > 95.6 94.8 93.7 96.0 94.7  PLT 255   < > 225   < > 284 255 236 261 260   < > = values in this interval not displayed.   Basic Metabolic Panel: Recent Labs  Lab 09/04/19 0142 09/04/19 2056 09/05/19 0657 09/06/19 0148 09/07/19 0210  NA 137 138 137 139 140  K 4.8 4.2 4.5 3.9 3.9  CL 105 110 108 111 109  CO2 22 17* 19* 20* 22  GLUCOSE 225* 133* 168* 129* 73  BUN 24* 27* 30* 23 16  CREATININE 0.90 1.40* 1.39* 0.99 0.78  CALCIUM 8.8* 8.3* 8.5* 8.5* 8.8*  MG 1.7  --  1.2* 1.3*  --  PHOS  --   --  3.8  --   --    Liver Function Tests: Recent Labs  Lab 09/03/19 0742 09/04/19 0142 09/04/19 2056 09/05/19 0657 09/06/19 0148  AST 89* 81* 61* 71* 36  ALT 142* 117* 90* 99* 79*  ALKPHOS 806* 705* 572* 505* 493*  BILITOT 1.7* 1.5* 1.8* 1.8* 1.4*  PROT 5.7* 5.4* 4.8* 4.7* 4.4*  ALBUMIN 2.3* 2.2* 2.0* 1.9* 1.8*   Recent Labs  Lab 09/03/19 0742  LIPASE 25   No results for input(s): AMMONIA in the last 168 hours. Cardiac Enzymes: No results for input(s): CKTOTAL, CKMB, CKMBINDEX, TROPONINI in the last 168 hours. BNP (last 3 results) Recent Labs    09/03/19 1520 09/04/19 0142  BNP 46.3 59.3    ProBNP (last 3 results) No results for input(s): PROBNP in the last 8760 hours.  CBG: Recent Labs  Lab 09/06/19 0721 09/06/19 0806 09/06/19 1122 09/06/19 1632 09/06/19 2001  GLUCAP 43* 81 142* 163* 149*   Recent Results (from the past 240 hour(s))  Culture, blood (routine x 2)     Status: None (Preliminary result)   Collection Time: 09/03/19  1:46 AM   Specimen: BLOOD RIGHT HAND  Result Value Ref  Range Status   Specimen Description BLOOD RIGHT HAND  Final   Special Requests   Final    BOTTLES DRAWN AEROBIC AND ANAEROBIC Blood Culture results may not be optimal due to an inadequate volume of blood received in culture bottles   Culture   Final    NO GROWTH 3 DAYS Performed at [facility], [place] [address] [place], [place] [address]    Report Status PENDING  Incomplete  Culture, blood (routine x 2)     Status: None (Preliminary result)   Collection Time: 09/03/19  2:40 AM   Specimen: BLOOD  Result Value Ref Range Status   Specimen Description BLOOD SITE NOT SPECIFIED  Final   Special Requests   Final    BOTTLES DRAWN AEROBIC AND ANAEROBIC Blood Culture results may not be optimal due to an inadequate volume of blood received in culture bottles   Culture   Final    NO GROWTH 3 DAYS Performed at [facility], [place] [address], [place], [place] [address]    Report Status PENDING  Incomplete  SARS Coronavirus 2 by RT PCR (hospital order, performed in [facility] hospital lab) Nasopharyngeal Nasopharyngeal Swab     Status: None   Collection Time: 09/03/19  3:00 AM   Specimen: Nasopharyngeal Swab  Result Value Ref Range Status   SARS Coronavirus 2 NEGATIVE NEGATIVE Final    Comment: (NOTE) SARS-CoV-2 target nucleic acids are NOT DETECTED.  The SARS-CoV-2 RNA is generally detectable in upper and lower respiratory specimens during the acute phase of infection. The lowest concentration of SARS-CoV-2 viral copies this assay can detect is 250 copies / mL. A negative result does not preclude SARS-CoV-2 infection and should not be used as the sole basis for treatment or other patient management decisions.  A negative result may occur with improper specimen collection / handling, submission of specimen other than nasopharyngeal swab, presence of viral mutation(s) within the areas targeted by this assay, and inadequate number of viral copies (<250 copies / mL). A negative result  must be combined with clinical observations, patient history, and epidemiological information.  Fact Sheet for Patients:   StrictlyIdeas.no  Fact Sheet for Healthcare Providers: BankingDealers.co.za  This test is not yet approved or  cleared by the [place] and has been authorized for detection and/or diagnosis of SARS-CoV-2 by FDA under an Emergency Use Authorization (EUA).  This EUA will remain in effect (meaning this test can be used) for the duration of the COVID-19 declaration under Section 564(b)(1) of the Act, 21 U.S.C. section 360bbb-3(b)(1), unless the authorization is terminated or revoked sooner.  Performed at [facility], [place] [address] [place], [place] [address]   Culture, Urine     Status: None   Collection Time: 09/05/19  5:03 AM   Specimen: Urine, Clean Catch  Result Value Ref Range Status   Specimen Description URINE, CLEAN CATCH  Final   Special Requests NONE  Final   Culture   Final    NO GROWTH Performed at [facility], [place] [address] [place], [place] [address]    Report Status 09/06/2019 FINAL  Final  Culture, blood (Routine X 2) w Reflex to ID Panel     Status: None (Preliminary result)   Collection Time: 09/05/19  6:57 AM   Specimen: BLOOD LEFT ARM  Result Value Ref Range Status   Specimen Description BLOOD LEFT ARM  Final   Special Requests   Final    BOTTLES DRAWN AEROBIC ONLY Blood Culture results may not be optimal due to an inadequate volume of blood received in culture bottles   Culture   Final    NO GROWTH 1 DAY Performed at [facility], [place] [address] [place], [place] [address]    Report Status PENDING  Incomplete  Culture, blood (Routine X 2) w Reflex to ID Panel     Status: None (Preliminary result)   Collection Time: 09/05/19  6:59 AM   Specimen: BLOOD LEFT HAND  Result Value Ref Range Status   Specimen Description BLOOD LEFT HAND  Final   Special Requests    Final    BOTTLES DRAWN AEROBIC ONLY Blood Culture results may not be optimal due to an inadequate volume of blood received in culture bottles   Culture   Final    NO GROWTH 1 DAY Performed at [facility], [place] [address], [place], [place] [address]    Report Status PENDING  Incomplete     Studies: DG Shoulder 1V Right  Result Date: 09/06/2019 CLINICAL DATA:  RIGHT shoulder pain of unspecified chronicity. EXAM: RIGHT SHOULDER - 1 VIEW COMPARISON:  07/24/2019 FINDINGS: No evidence of acute or subacute fracture. Glenohumeral joint anatomically aligned in the AP projection with severe degenerative changes. Hypertrophic spurring involving the humeral head. Acromioclavicular joint anatomically aligned with moderate degenerative changes. IMPRESSION: No acute osseous abnormality. 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 Electronically Signed   By: Evangeline Dakin M.D.   On: 09/06/2019 14:53   DG Swallowing Func-Speech Pathology  Result Date: 09/05/2019 Objective Swallowing Evaluation: Type of Study: Bedside Swallow Evaluation  Patient Details Name: Allure Greaser MRN: [id number] Date of Birth: 31-Aug-1950 Today's Date: 09/05/2019 Time: SLP Start Time (ACUTE ONLY): 1100 -SLP Stop Time (ACUTE ONLY): 1130 SLP Time Calculation (min) (ACUTE ONLY): 30 min Past Medical History: Past Medical History: Diagnosis Date . Diabetes mellitus without complication ([place])  . Hypertension  Past Surgical History: Past Surgical History: Procedure Laterality Date . TONSILLECTOMY   HPI: Stewart Pimenta is a 69 y.o. female with history of diabetes mellitus type 2, hypertension, gout, asthma who was recently admitted to the hospital for MRSA and enterococcal bacteremia.  She completed her antibiotic course of 6 weeks  2 days ago and her PICC line was removed.  She was also recently admitted for anemia requiring blood transfusion and gastroenterology was planning outpatient colonoscopy.  She was also found to  have elevated LFTs during the recent admissions with  work-up so far being negative.  She presents to the ER from nursing home adue to shortness of breath with productive cough for the last 3 days.  She also some epigastric but no discomfort, nausea or vomiting.  She also reported some chest pressure off and on. Chest xray was showing increased peripheral RUL ground glass opacity with possible small foci of peripheral airspace disease at the left base, findings are suspicious for multifocal PNA - possibly atypical vs viral.  She was diagnosed with community acquired PNA.   Subjective: The patient was seen in radiology for MBS.  Assessment / Plan / Recommendation CHL IP CLINICAL IMPRESSIONS 09/05/2019 Clinical Impression MBS was completed using thin liquids via spoon, cup and straw, pureed material and solids.  The patient was able to self feed during this exam.  Her oral and pharyngeal swallow were functional.  Mastication of dry solids was adequate and no residue was seen post swallow.  Intermittent transient penetration was seen given thin liquids.  This material was noted to completely clear the laryngeal vestibule.  No pharyngeal residue was seen.  Esophageal sweep did reveal it slow to clear raising suspicion for esophageal dysphagia.  She was encouraged to alternate food/liquids and remain upright for 30-60 minutes after meals.  Recommend a regular diet with thin liquids.  ST follow up is not indicated.  She may benefit from a GI consult.   SLP Visit Diagnosis Dysphagia, unspecified (R13.10) Attention and concentration deficit following -- Frontal lobe and executive function deficit following -- Impact on safety and function Mild aspiration risk   CHL IP TREATMENT RECOMMENDATION 09/05/2019 Treatment Recommendations No treatment recommended at this time   No flowsheet data found. CHL IP DIET RECOMMENDATION 09/05/2019 SLP Diet Recommendations Thin liquid;Regular solids Liquid Administration via Straw;Cup Medication  Administration Whole meds with liquid Compensations Follow solids with liquid Postural Changes Seated upright at 90 degrees;Remain semi-upright after after feeds/meals (Comment)   CHL IP OTHER RECOMMENDATIONS 09/05/2019 Recommended Consults Consider GI evaluation Oral Care Recommendations Oral care BID Other Recommendations --   CHL IP FOLLOW UP RECOMMENDATIONS 09/05/2019 Follow up Recommendations None   No flowsheet data found.     No flowsheet data found. No flowsheet data found. No flowsheet data found. Shelly Flatten, MA, CCC-SLP Acute Rehab SLP [phone] [name] 09/05/2019, 11:58 AM              VAS [place] UPPER EXTREMITY VENOUS DUPLEX  Result Date: 09/07/2019 UPPER VENOUS STUDY  Indications: Pain, and recent PICC line Comparison Study: No prior study on file Performing Technologist: Sharion Dove RVS  Examination Guidelines: A complete evaluation includes B-mode imaging, spectral Doppler, color Doppler, and power Doppler as needed of all accessible portions of each vessel. Bilateral testing is considered an integral part of a complete examination. Limited examinations for reoccurring indications may be performed as noted.  Right Findings: +----------+------------+---------+-----------+----------+-------+ RIGHT     CompressiblePhasicitySpontaneousPropertiesSummary +----------+------------+---------+-----------+----------+-------+ IJV           Full       Yes       Yes                      +----------+------------+---------+-----------+----------+-------+ Subclavian  Yes       Yes                      +----------+------------+---------+-----------+----------+-------+ Axillary                 Yes       Yes                      +----------+------------+---------+-----------+----------+-------+ Brachial      Full       Yes       Yes                      +----------+------------+---------+-----------+----------+-------+ Cephalic      Full                                           +----------+------------+---------+-----------+----------+-------+ Basilic       Full                                          +----------+------------+---------+-----------+----------+-------+  Left Findings: +----------+------------+---------+-----------+----------+-------+ LEFT      CompressiblePhasicitySpontaneousPropertiesSummary +----------+------------+---------+-----------+----------+-------+ Subclavian               Yes       Yes                      +----------+------------+---------+-----------+----------+-------+  Summary:  Right: No evidence of deep vein thrombosis in the upper extremity. No evidence of superficial vein thrombosis in the upper extremity.  Left: No evidence of thrombosis in the subclavian.  *See table(s) above for measurements and observations.  Diagnosing physician: Deitra Mayo MD Electronically signed by Deitra Mayo MD on 09/07/2019 at 6:53:32 AM.    Final       Flora Lipps, MD  [facility] 09/07/2019

## 2019-09-08 DIAGNOSIS — J45909 Unspecified asthma, uncomplicated: Secondary | ICD-10-CM | POA: Diagnosis not present

## 2019-09-08 DIAGNOSIS — J189 Pneumonia, unspecified organism: Secondary | ICD-10-CM | POA: Diagnosis not present

## 2019-09-08 DIAGNOSIS — I10 Essential (primary) hypertension: Secondary | ICD-10-CM | POA: Diagnosis not present

## 2019-09-08 DIAGNOSIS — R110 Nausea: Secondary | ICD-10-CM

## 2019-09-08 DIAGNOSIS — R7989 Other specified abnormal findings of blood chemistry: Secondary | ICD-10-CM | POA: Diagnosis not present

## 2019-09-08 LAB — CBC
HCT: 25.8 % — ABNORMAL LOW (ref 36.0–46.0)
Hemoglobin: 7.9 g/dL — ABNORMAL LOW (ref 12.0–15.0)
MCH: 29.4 pg (ref 26.0–34.0)
MCHC: 30.6 g/dL (ref 30.0–36.0)
MCV: 95.9 fL (ref 80.0–100.0)
Platelets: 264 10*3/uL (ref 150–400)
RBC: 2.69 MIL/uL — ABNORMAL LOW (ref 3.87–5.11)
RDW: 17 % — ABNORMAL HIGH (ref 11.5–15.5)
WBC: 8.9 10*3/uL (ref 4.0–10.5)
nRBC: 0 % (ref 0.0–0.2)

## 2019-09-08 LAB — BASIC METABOLIC PANEL
Anion gap: 7 (ref 5–15)
BUN: 13 mg/dL (ref 8–23)
CO2: 23 mmol/L (ref 22–32)
Calcium: 8.7 mg/dL — ABNORMAL LOW (ref 8.9–10.3)
Chloride: 110 mmol/L (ref 98–111)
Creatinine, Ser: 0.79 mg/dL (ref 0.44–1.00)
GFR calc Af Amer: >60 mL/min (ref >60)
GFR calc non Af Amer: >60 mL/min (ref >60)
Glucose, Bld: 86 mg/dL (ref 70–99)
Potassium: 3.7 mmol/L (ref 3.5–5.1)
Sodium: 140 mmol/L (ref 135–145)

## 2019-09-08 LAB — CULTURE, BLOOD (ROUTINE X 2)
Culture: NO GROWTH
Culture: NO GROWTH

## 2019-09-08 LAB — GLUCOSE, CAPILLARY
Glucose-Capillary: 41 mg/dL — CL (ref 70–99)
Glucose-Capillary: 48 mg/dL — ABNORMAL LOW (ref 70–99)
Glucose-Capillary: 147 mg/dL — ABNORMAL HIGH (ref 70–99)
Glucose-Capillary: 166 mg/dL — ABNORMAL HIGH (ref 70–99)
Glucose-Capillary: 97 mg/dL (ref 70–99)
Glucose-Capillary: 174 mg/dL — ABNORMAL HIGH (ref 70–99)

## 2019-09-08 LAB — SARS CORONAVIRUS 2 BY RT PCR (HOSPITAL ORDER, PERFORMED IN ~~LOC~~ HOSPITAL LAB): SARS Coronavirus 2: NEGATIVE

## 2019-09-08 LAB — MAGNESIUM: Magnesium: 1.7 mg/dL (ref 1.7–2.4)

## 2019-09-08 MED ORDER — BACID PO TABS
2.0000 | ORAL_TABLET | Freq: Three times a day (TID) | ORAL | Status: DC
  Administered 2019-09-08 – 2019-09-09 (×3): 2 via ORAL
  Filled 2019-09-08 (×5): qty 2

## 2019-09-08 MED ORDER — DEXTROSE 50 % IV SOLN: 1.0000 | Freq: Once | INTRAVENOUS | Status: AC

## 2019-09-08 MED ORDER — INSULIN GLARGINE 100 UNIT/ML ~~LOC~~ SOLN
15.0000 [IU] | Freq: Every day | SUBCUTANEOUS | Status: DC
  Administered 2019-09-08: 15 [IU] via SUBCUTANEOUS
  Filled 2019-09-08 (×2): qty 0.15

## 2019-09-08 MED ORDER — DEXTROSE 50 % IV SOLN
INTRAVENOUS | Status: AC
  Administered 2019-09-08: 50 mL
  Filled 2019-09-08: qty 50

## 2019-09-08 NOTE — Progress Notes (Signed)
PROGRESS NOTE  Heather Petty EML:[id number] DOB: 20-Aug-1950 DOA: 09/02/2019 PCP: System, Pcp Not In   LOS: 5 days   Brief narrative: As per HPI,  Heather Petty is a 69 y.o. female with history of diabetes mellitus type 2, hypertension, gout, asthma who was recently admitted to the hospital for MRSA and enterococcal bacteremia patient completed her antibiotic course of 6 weeks 2 days ago and her PICC line was removed also was recently admitted for anemia requiring blood transfusion and gastroenterology was planning outpatient colonoscopy also was found to have elevated LFTs during the recent admissions work-up so far was negative presents to the ER from nursing home after patient was having shortness of breath with productive cough for the last 3 days.  Has some epigastric discomfort no nausea vomiting also has some chest pressure off and on.  In the ER, patient with temperature of 101.7 F lactic acid was normal WBC count was 10 hemoglobin 10 EKG was showing normal sinus rhythm. Covid test was negative chest x-ray showing bilateral infiltrates concerning for multifocal pneumonia.  Patient's labs also showed markedly elevated alkaline phosphatase at 1055 which is increased from her recent past.  Total bilirubin of 2.1 AST was 254 ALT 214.  Patient was started on empiric antibiotics admitted for pneumonia which is multifocal with markedly elevated LFTs.  Ultrasonogram of the abdomen shows gallbladder sludge.  No definite evidence of any cholecystitis.  Patient was then admitted to hospital for further evaluation and treatment.  Assessment/Plan:  Principal Problem:   CAP (community acquired pneumonia) Active Problems:   Uncontrolled type 2 diabetes mellitus with hyperglycemia (HCC)   Elevated LFTs   Essential hypertension   Gout   Asthma   Anemia   Pneumonia  Community-acquired pneumonia. Multifocal pneumonia with acute hypoxic respiratory failure.. Likely bacterial pneumonia. Procal  0.4>0.5.   COVID-19 was negative.   BNP at 59.3.  2D echocardiogram with LV function of 60 to 65%. Blood culture negative so far  On linezolid and cefepime.  Will complete 5-day course today.  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  Vancomycin added to allergy list.   Doxepin has been discontinued for linezolid.  Seen by speech therapy and recommend regular consistency diet with thin liquids.  Mild aspiration risk.  Leukocytosis has normalized..  Follow temperature curve for the next 24 hours as per ID.Marland Kitchen  Possible disposition to skilled nursing facility tomorrow.    Nausea diarrheax1.  Could be antibiotic related.  Consider loperamide if frequent diarrhea.  Added probiotic.   Elevated LFTs Patient has had recent work-up including MRI and hepatic serologies which were unremarkable.  GI was supposed to follow up as outpatient which has been scheduled from [facility] place .  LFTs are trending down.  Recent admission for enterococcal and MRSA bacteremia just finished 6 weeks course of IV antibiotics 2 days prior to admission and PICC line was removed.  ID on board.   WBC of 13.3>11.8>8.9.  Blood cultures negative this admission.  Diabetes mellitus type 2.  Last known hemoglobin A1c of 10.4 on June 2021.  Continue Lantus, sliding scale insulin, Accu-Cheks diabetic diet.    Patient was mildly hypoglycemic this morning.  Will decrease Lantus to 15 units at nighttime.  History of gout on allopurinol.  Essential  hypertension on nadolol and amlodipine.  Will closely monitor .  Blood pressure is controlled.   History of asthma.   Continue steroid neb, albuterol. Wean oxygen as able.  Chronic simple anemia.  History of blood transfusion.  Hemoglobin of 7.9 today.  Monitor CBC closely.  No evidence of bleeding  Mild hypokalemia. replenished.  Potassium of 3.7 today.  Mild hypomagnesemia.  Replenished.  Magnesium 1.7  History of schizophrenia. Used to follow with psychiatry in the past. On risperidone.  Encouraged psychiatry follow up as outpatient.  Right shoulder/arm pain.  History of recent fall. .  Negative x-ray of the right shoulder for fracture. Ultrasound of the right upper extremity was negative for DVT.  Warm compression diclofenac gel,  and supportive care initiated yesterday.  DVT prophylaxis: SCDs Start: 09/03/19 3664   Code Status:  Full code  Family Communication: None today.  Status is: Inpatient  Remains inpatient appropriate because: Inpatient level of care appropriate due to severity of illness , awaiting for skilled nursing facility placement,  Dispo:  Patient From: [place]  Planned Disposition: [place]  Expected discharge date: Tomorrow if no fever  Medically stable for discharge: yes  Consultants:  Infectious disease  Procedures:  none  Antibiotics:  . Linezolid . cefepime  Subjective:  Today, patient complains of mild diarrhea and felt nauseated/vomiting this morning.  No fever, dyspnea or increasing cough.  Objective: Vitals:   09/08/19 0300 09/08/19 0346  BP:  (!) 115/56  Pulse:  74  Resp: 18 19  Temp:  97.9 F (36.6 C)  SpO2:      Intake/Output Summary (Last 24 hours) at 09/08/2019 4034 Last data filed at 09/08/2019 0600 Gross per 24 hour  Intake 300 ml  Output 2450 ml  Net -2150 ml   There were no vitals filed for this visit. There is no height or weight on file to calculate BMI.   Physical Exam: GENERAL: Alert awake and communicative.  Not in obvious distress. .  Obese HENT: No scleral pallor or icterus. Pupils equally reactive to light. Oral mucosa is moist NECK: is supple, no gross swelling noted. CHEST: Diminished breath sounds bilaterally CVS: S1 and S2 heard, no murmur. Regular rate and rhythm.  ABDOMEN: Soft, non-tender, bowel sounds are present. EXTREMITIES: No edema.  CNS: Cranial nerves are intact. No focal motor deficits. SKIN: warm and dry without rashes.  Data Review: I have  personally reviewed the following laboratory data and studies,  CBC: Recent Labs  Lab 09/02/19 1917 09/02/19 1917 09/03/19 0742 09/04/19 0142 09/04/19 2056 09/04/19 2056 09/05/19 0657 09/06/19 0148 09/06/19 1525 09/07/19 0210 09/08/19 0050  WBC 10.0   < > 7.4   < > 17.3*   < > 13.3* 11.8* 9.9 10.0 8.9  NEUTROABS 6.5  --  4.0  --  14.7*  --   --   --   --   --   --   HGB 10.0*   < > 8.3*   < > 9.6*   < > 9.1* 7.7* 8.9* 8.2* 7.9*  HCT 32.4*   < > 26.7*   < > 30.5*   < > 28.9* 23.8* 29.0* 26.6* 25.8*  MCV 95.0   < > 94.3   < > 95.6   < > 94.8 93.7 96.0 94.7 95.9  PLT 255   < > 225   < > 284   < > 255 236 261 260 264   < > = values in this interval not displayed.   Basic Metabolic Panel: Recent Labs  Lab 09/04/19 0142 09/04/19 0142 09/04/19 2056 09/05/19 0657 09/06/19 0148 09/07/19 0210 09/08/19 0050  NA 137   < > 138 137 139 140 140  K 4.8   < >  4.2 4.5 3.9 3.9 3.7  CL 105   < > 110 108 111 109 110  CO2 22   < > 17* 19* 20* 22 23  GLUCOSE 225*   < > 133* 168* 129* 73 86  BUN 24*   < > 27* 30* 23 16 13   CREATININE 0.90   < > 1.40* 1.39* 0.99 0.78 0.79  CALCIUM 8.8*   < > 8.3* 8.5* 8.5* 8.8* 8.7*  MG 1.7  --   --  1.2* 1.3*  --  1.7  PHOS  --   --   --  3.8  --   --   --    < > = values in this interval not displayed.   Liver Function Tests: Recent Labs  Lab 09/03/19 0742 09/04/19 0142 09/04/19 2056 09/05/19 0657 09/06/19 0148  AST 89* 81* 61* 71* 36  ALT 142* 117* 90* 99* 79*  ALKPHOS 806* 705* 572* 505* 493*  BILITOT 1.7* 1.5* 1.8* 1.8* 1.4*  PROT 5.7* 5.4* 4.8* 4.7* 4.4*  ALBUMIN 2.3* 2.2* 2.0* 1.9* 1.8*   Recent Labs  Lab 09/03/19 0742  LIPASE 25   No results for input(s): AMMONIA in the last 168 hours. Cardiac Enzymes: No results for input(s): CKTOTAL, CKMB, CKMBINDEX, TROPONINI in the last 168 hours. BNP (last 3 results) Recent Labs    09/03/19 1520 09/04/19 0142  BNP 46.3 59.3    ProBNP (last 3 results) No results for input(s): PROBNP in  the last 8760 hours.  CBG: Recent Labs  Lab 09/07/19 0914 09/07/19 1144 09/07/19 1605 09/07/19 2131 09/08/19 0704  GLUCAP 49* 107* 172* 158* 41*   Recent Results (from the past 240 hour(s))  Culture, blood (routine x 2)     Status: None (Preliminary result)   Collection Time: 09/03/19  1:46 AM   Specimen: BLOOD RIGHT HAND  Result Value Ref Range Status   Specimen Description BLOOD RIGHT HAND  Final   Special Requests   Final    BOTTLES DRAWN AEROBIC AND ANAEROBIC Blood Culture results may not be optimal due to an inadequate volume of blood received in culture bottles   Culture   Final    NO GROWTH 4 DAYS Performed at [facility], [place] [address], [place], [place] [address]    Report Status PENDING  Incomplete  Culture, blood (routine x 2)     Status: None (Preliminary result)   Collection Time: 09/03/19  2:40 AM   Specimen: BLOOD  Result Value Ref Range Status   Specimen Description BLOOD SITE NOT SPECIFIED  Final   Special Requests   Final    BOTTLES DRAWN AEROBIC AND ANAEROBIC Blood Culture results may not be optimal due to an inadequate volume of blood received in culture bottles   Culture   Final    NO GROWTH 4 DAYS Performed at [facility], [place] [address], [place], [place] [address]    Report Status PENDING  Incomplete  SARS Coronavirus 2 by RT PCR (hospital order, performed in [facility] hospital lab) Nasopharyngeal Nasopharyngeal Swab     Status: None   Collection Time: 09/03/19  3:00 AM   Specimen: Nasopharyngeal Swab  Result Value Ref Range Status   SARS Coronavirus 2 NEGATIVE NEGATIVE Final    Comment: (NOTE) SARS-CoV-2 target nucleic acids are NOT DETECTED.  The SARS-CoV-2 RNA is generally detectable in upper and lower respiratory specimens during the acute phase of infection. The lowest concentration of SARS-CoV-2 viral copies this assay  can detect is 250 copies / mL. A negative result does not preclude SARS-CoV-2 infection and should not be  used as the sole basis for treatment or other patient management decisions.  A negative result may occur with improper specimen collection / handling, submission of specimen other than nasopharyngeal swab, presence of viral mutation(s) within the areas targeted by this assay, and inadequate number of viral copies (<250 copies / mL). A negative result must be combined with clinical observations, patient history, and epidemiological information.  Fact Sheet for Patients:   StrictlyIdeas.no  Fact Sheet for Healthcare Providers: BankingDealers.co.za  This test is not yet approved or  cleared by the [place] FDA and has been authorized for detection and/or diagnosis of SARS-CoV-2 by FDA under an Emergency Use Authorization (EUA).  This EUA will remain in effect (meaning this test can be used) for the duration of the COVID-19 declaration under Section 564(b)(1) of the Act, 21 U.S.C. section 360bbb-3(b)(1), unless the authorization is terminated or revoked sooner.  Performed at [facility], [place] [address] [place], [place] [address]   Culture, Urine     Status: None   Collection Time: 09/05/19  5:03 AM   Specimen: Urine, Clean Catch  Result Value Ref Range Status   Specimen Description URINE, CLEAN CATCH  Final   Special Requests NONE  Final   Culture   Final    NO GROWTH Performed at [facility], [place] [address] [place], [place] [address]    Report Status 09/06/2019 FINAL  Final  Culture, blood (Routine X 2) w Reflex to ID Panel     Status: None (Preliminary result)   Collection Time: 09/05/19  6:57 AM   Specimen: BLOOD LEFT ARM  Result Value Ref Range Status   Specimen Description BLOOD LEFT ARM  Final   Special Requests   Final    BOTTLES DRAWN AEROBIC ONLY Blood Culture results may not be optimal due to an inadequate volume of blood received in culture bottles   Culture   Final    NO GROWTH 2 DAYS Performed at  [facility], [place] [address], [place], [place] [address]    Report Status PENDING  Incomplete  Culture, blood (Routine X 2) w Reflex to ID Panel     Status: None (Preliminary result)   Collection Time: 09/05/19  6:59 AM   Specimen: BLOOD LEFT HAND  Result Value Ref Range Status   Specimen Description BLOOD LEFT HAND  Final   Special Requests   Final    BOTTLES DRAWN AEROBIC ONLY Blood Culture results may not be optimal due to an inadequate volume of blood received in culture bottles   Culture   Final    NO GROWTH 2 DAYS Performed at [facility], [place] [address] [place],  [address]    Report Status PENDING  Incomplete     Studies: DG Shoulder 1V Right  Result Date: 09/06/2019 CLINICAL DATA:  RIGHT shoulder pain of unspecified chronicity. EXAM: RIGHT SHOULDER - 1 VIEW COMPARISON:  07/24/2019 FINDINGS: No evidence of acute or subacute fracture. Glenohumeral joint anatomically aligned in the AP projection with severe degenerative changes. Hypertrophic spurring involving the humeral head. Acromioclavicular joint anatomically aligned with moderate degenerative changes. IMPRESSION: No acute osseous abnormality. 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 Electronically Signed   By: Evangeline Dakin M.D.   On: 09/06/2019 14:53   VAS [place] UPPER EXTREMITY VENOUS DUPLEX  Result Date: 09/07/2019 UPPER VENOUS STUDY  Indications: Pain, and recent PICC line Comparison Study: No prior study on file Performing Technologist: Sharion Dove RVS  Examination Guidelines: A complete evaluation includes B-mode imaging, spectral Doppler, color Doppler, and power Doppler as needed of all accessible portions of each vessel. Bilateral testing is considered an integral part of a complete examination. Limited examinations for reoccurring indications may be performed as noted.  Right Findings: +----------+------------+---------+-----------+----------+-------+ RIGHT      CompressiblePhasicitySpontaneousPropertiesSummary +----------+------------+---------+-----------+----------+-------+ IJV           Full       Yes       Yes                      +----------+------------+---------+-----------+----------+-------+ Subclavian               Yes       Yes                      +----------+------------+---------+-----------+----------+-------+ Axillary                 Yes       Yes                      +----------+------------+---------+-----------+----------+-------+ Brachial      Full       Yes       Yes                      +----------+------------+---------+-----------+----------+-------+ Cephalic      Full                                          +----------+------------+---------+-----------+----------+-------+ Basilic       Full                                          +----------+------------+---------+-----------+----------+-------+  Left Findings: +----------+------------+---------+-----------+----------+-------+ LEFT      CompressiblePhasicitySpontaneousPropertiesSummary +----------+------------+---------+-----------+----------+-------+ Subclavian               Yes       Yes                      +----------+------------+---------+-----------+----------+-------+  Summary:  Right: No evidence of deep vein thrombosis in the upper extremity. No evidence of superficial vein thrombosis in the upper extremity.  Left: No evidence of thrombosis in the subclavian.  *See table(s) above for measurements and observations.  Diagnosing physician: Deitra Mayo MD Electronically signed by Deitra Mayo MD on 09/07/2019 at 6:53:32 AM.    Final      Flora Lipps, MD  [facility] 09/08/2019

## 2019-09-08 NOTE — TOC Progression Note (Signed)
Transition of Care [facility]) - Progression Note    Patient Details  Name: Falen Lehrmann MRN: [id number] Date of Birth: 03-27-1950  Transition of [facility] CM/SW [place], RN Phone Number: [phone]  09/08/2019, 1:07 PM  Clinical Narrative:  Requested new covid test for discharge to [facility] place. Results pending. Will continue to follow for results.     Expected Discharge Plan: [place] Barriers to Discharge: Continued Medical Work up  Expected Discharge Plan and Services Expected Discharge Plan: [place] In-house Referral: Clinical Social Work Discharge Planning Services: CM Consult Post Acute Care Choice: [place] arrangements for the past 2 months: Single Family Home                                       Social Determinants of Health (SDOH) Interventions    Readmission Risk Interventions No flowsheet data found.

## 2019-09-08 NOTE — Progress Notes (Signed)
[facility] for Infectious Disease  Date of Admission:  09/02/2019     Total days of antibiotics 7         ASSESSMENT:  Heather Petty is on Day 5 of antimicrobial therapy for presumed pneumonia. She has been afebrile for the last 72 hours with normal white blood cell count. Will put stop date for antibiotic therapy to end tonight as she has received sufficient treatment for pneumonia. No additional antibiotics are necessary. Continue anemia management per primary team.   PLAN:  1. Complete antibiotics at the end of the day today. 2. Anemia management per primary team. 3. No further antibiotics indicated.  4. Will monitor fever curve for the next 24 hours.  ID will sign off for now. Please resconsult if needed.   Principal Problem:   CAP (community acquired pneumonia) Active Problems:   Uncontrolled type 2 diabetes mellitus with hyperglycemia (HCC)   Elevated LFTs   Essential hypertension   Gout   Asthma   Anemia   Pneumonia   . allopurinol  300 mg Oral Daily  . amLODipine  10 mg Oral Daily  . ascorbic acid  500 mg Oral Daily  . budesonide  0.5 mg Inhalation BID  . diclofenac Sodium  2 g Topical QID  . famotidine  20 mg Oral BID  . feeding supplement (GLUCERNA SHAKE)  237 mL Oral Daily  . folic acid  1 mg Oral Daily  . insulin aspart  0-9 Units Subcutaneous TID WC  . insulin glargine  15 Units Subcutaneous QHS  . lactobacillus acidophilus  2 tablet Oral TID  . lidocaine  1 patch Transdermal Q24H  . linezolid  600 mg Oral Q12H  . magnesium oxide  400 mg Oral BID  . nadolol  20 mg Oral Daily  . risperiDONE  0.5 mg Oral BID  . zinc sulfate  220 mg Oral QHS    SUBJECTIVE:  Afebrile overnight with no acute events. Not feeling well this morning with 1 episode of emesis and diarrhea.   Allergies  Allergen Reactions  . Vancomycin Other (See Comments)    Patient becomes hypotensive, lethargic and itchy   . Lasix [Furosemide] Other (See Comments)    On MAR      Review of Systems: Review of Systems  Constitutional: Positive for malaise/fatigue. Negative for chills, fever and weight loss.  Respiratory: Negative for cough, shortness of breath and wheezing.   Cardiovascular: Negative for chest pain and leg swelling.  Gastrointestinal: Positive for diarrhea and vomiting. Negative for abdominal pain, constipation and nausea.  Skin: Negative for rash.     OBJECTIVE: Vitals:   09/08/19 0800 09/08/19 0833 09/08/19 0900 09/08/19 1100  BP:      Pulse:      Resp: 18  21 17   Temp:      TempSrc:      SpO2: 97% 100% 100% 98%   There is no height or weight on file to calculate BMI.  Physical Exam Constitutional:      General: She is not in acute distress.    Appearance: She is well-developed.     Comments: Lying in bed with head of bed elevated; pleasant.   Cardiovascular:     Rate and Rhythm: Normal rate and regular rhythm.     Heart sounds: Normal heart sounds.  Pulmonary:     Effort: Pulmonary effort is normal.     Breath sounds: Normal breath sounds.  Abdominal:     General: There is  no distension.     Palpations: There is no mass.     Tenderness: There is no guarding or rebound.  Skin:    General: Skin is warm and dry.  Neurological:     Mental Status: She is alert and oriented to person, place, and time.     Lab Results Lab Results  Component Value Date   WBC 8.9 09/08/2019   HGB 7.9 (L) 09/08/2019   HCT 25.8 (L) 09/08/2019   MCV 95.9 09/08/2019   PLT 264 09/08/2019    Lab Results  Component Value Date   CREATININE 0.79 09/08/2019   BUN 13 09/08/2019   NA 140 09/08/2019   K 3.7 09/08/2019   CL 110 09/08/2019   CO2 23 09/08/2019    Lab Results  Component Value Date   ALT 79 (H) 09/06/2019   AST 36 09/06/2019   ALKPHOS 493 (H) 09/06/2019   BILITOT 1.4 (H) 09/06/2019     Microbiology: Recent Results (from the past 240 hour(s))  Culture, blood (routine x 2)     Status: None (Preliminary result)   Collection  Time: 09/03/19  1:46 AM   Specimen: BLOOD RIGHT HAND  Result Value Ref Range Status   Specimen Description BLOOD RIGHT HAND  Final   Special Requests   Final    BOTTLES DRAWN AEROBIC AND ANAEROBIC Blood Culture results may not be optimal due to an inadequate volume of blood received in culture bottles   Culture   Final    NO GROWTH 4 DAYS Performed at [facility], [place] [address], [place], [place] [address]    Report Status PENDING  Incomplete  Culture, blood (routine x 2)     Status: None (Preliminary result)   Collection Time: 09/03/19  2:40 AM   Specimen: BLOOD  Result Value Ref Range Status   Specimen Description BLOOD SITE NOT SPECIFIED  Final   Special Requests   Final    BOTTLES DRAWN AEROBIC AND ANAEROBIC Blood Culture results may not be optimal due to an inadequate volume of blood received in culture bottles   Culture   Final    NO GROWTH 4 DAYS Performed at [facility], [place] [address] [place], [place] [address]    Report Status PENDING  Incomplete  SARS Coronavirus 2 by RT PCR (hospital order, performed in [facility] hospital lab) Nasopharyngeal Nasopharyngeal Swab     Status: None   Collection Time: 09/03/19  3:00 AM   Specimen: Nasopharyngeal Swab  Result Value Ref Range Status   SARS Coronavirus 2 NEGATIVE NEGATIVE Final    Comment: (NOTE) SARS-CoV-2 target nucleic acids are NOT DETECTED.  The SARS-CoV-2 RNA is generally detectable in upper and lower respiratory specimens during the acute phase of infection. The lowest concentration of SARS-CoV-2 viral copies this assay can detect is 250 copies / mL. A negative result does not preclude SARS-CoV-2 infection and should not be used as the sole basis for treatment or other patient management decisions.  A negative result may occur with improper specimen collection / handling, submission of specimen other than nasopharyngeal swab, presence of viral mutation(s) within the areas targeted by this assay, and  inadequate number of viral copies (<250 copies / mL). A negative result must be combined with clinical observations, patient history, and epidemiological information.  Fact Sheet for Patients:   StrictlyIdeas.no  Fact Sheet for Healthcare Providers: BankingDealers.co.za  This test is not yet approved or  cleared by the [place] FDA  and has been authorized for detection and/or diagnosis of SARS-CoV-2 by FDA under an Emergency Use Authorization (EUA).  This EUA will remain in effect (meaning this test can be used) for the duration of the COVID-19 declaration under Section 564(b)(1) of the Act, 21 U.S.C. section 360bbb-3(b)(1), unless the authorization is terminated or revoked sooner.  Performed at [facility], [place] [address], [place], [place] [address]   Culture, Urine     Status: None   Collection Time: 09/05/19  5:03 AM   Specimen: Urine, Clean Catch  Result Value Ref Range Status   Specimen Description URINE, CLEAN CATCH  Final   Special Requests NONE  Final   Culture   Final    NO GROWTH Performed at [facility], [place] [address], [place], [place] [address]    Report Status 09/06/2019 FINAL  Final  Culture, blood (Routine X 2) w Reflex to ID Panel     Status: None (Preliminary result)   Collection Time: 09/05/19  6:57 AM   Specimen: BLOOD LEFT ARM  Result Value Ref Range Status   Specimen Description BLOOD LEFT ARM  Final   Special Requests   Final    BOTTLES DRAWN AEROBIC ONLY Blood Culture results may not be optimal due to an inadequate volume of blood received in culture bottles   Culture   Final    NO GROWTH 2 DAYS Performed at [facility], [place] [address], [place], [place] [address]    Report Status PENDING  Incomplete  Culture, blood (Routine X 2) w Reflex to ID Panel     Status: None (Preliminary result)   Collection Time: 09/05/19  6:59 AM   Specimen: BLOOD LEFT HAND  Result Value Ref Range  Status   Specimen Description BLOOD LEFT HAND  Final   Special Requests   Final    BOTTLES DRAWN AEROBIC ONLY Blood Culture results may not be optimal due to an inadequate volume of blood received in culture bottles   Culture   Final    NO GROWTH 2 DAYS Performed at [facility], [place] [address], [place], [place] [address]    Report Status PENDING  Incomplete     Terri Piedra, [place] [facility]  09/08/2019  12:04 PM

## 2019-09-09 ENCOUNTER — Encounter: Payer: Self-pay | Admitting: Internal Medicine

## 2019-09-09 DIAGNOSIS — J45909 Unspecified asthma, uncomplicated: Secondary | ICD-10-CM | POA: Diagnosis not present

## 2019-09-09 DIAGNOSIS — R7989 Other specified abnormal findings of blood chemistry: Secondary | ICD-10-CM | POA: Diagnosis not present

## 2019-09-09 DIAGNOSIS — M109 Gout, unspecified: Secondary | ICD-10-CM

## 2019-09-09 DIAGNOSIS — J189 Pneumonia, unspecified organism: Secondary | ICD-10-CM | POA: Diagnosis not present

## 2019-09-09 DIAGNOSIS — D649 Anemia, unspecified: Secondary | ICD-10-CM | POA: Diagnosis not present

## 2019-09-09 LAB — GLUCOSE, CAPILLARY
Glucose-Capillary: 59 mg/dL — ABNORMAL LOW (ref 70–99)
Glucose-Capillary: 59 mg/dL — ABNORMAL LOW (ref 70–99)
Glucose-Capillary: 76 mg/dL (ref 70–99)
Glucose-Capillary: 76 mg/dL (ref 70–99)
Glucose-Capillary: 174 mg/dL — ABNORMAL HIGH (ref 70–99)

## 2019-09-09 MED ORDER — ACETAMINOPHEN 325 MG PO TABS: 650.0000 mg | ORAL_TABLET | Freq: Four times a day (QID) | ORAL | Status: DC | PRN

## 2019-09-09 MED ORDER — INSULIN GLARGINE 100 UNIT/ML ~~LOC~~ SOLN: 10.0000 [IU] | Freq: Every day | SUBCUTANEOUS | Status: DC

## 2019-09-09 MED ORDER — LOPERAMIDE HCL 2 MG PO CAPS: 2.0000 mg | ORAL_CAPSULE | ORAL | 0 refills | Status: AC | PRN

## 2019-09-09 NOTE — TOC Transition Note (Signed)
Transition of Care [facility]) - CM/SW Discharge Note   Patient Details  Name: Heather Petty MRN: [id number] Date of Birth: Jul 01, 1950  [facility] CM/SW Contact:  Angelita Ingles, RN Phone Number: [phone]  09/09/2019, 12:39 PM   Clinical Narrative: Patient to be discharged to [facility]. Attempted to call daughter Margreta Journey to make her aware but phone goes to voicemail. Patient has been updated. Bedside nurse made aware. Transport has been set up via [place]. Discharge packet has been placed at the nurses station.   Please call report to  [facility] [phone] Room # 904      Final next level of care: Skilled Nursing Facility Barriers to Discharge: No Barriers Identified   Patient Goals and CMS Choice Patient states their goals for this hospitalization and ongoing recovery are:: To go back to [facility].gov Compare Post Acute Care list provided to:: Patient Choice offered to / list presented to : Patient  Discharge Placement              Patient chooses bed at: [facility] Patient to be transferred to facility by: [place] Name of family member notified: Attempted to call Margreta Journey but call goes straight to voicemail , several attempts made Patient and family notified of of transfer:  (unable to get daughter to answer phone)  Discharge Plan and Services In-house Referral: Clinical Social Work Discharge Planning Services: CM Consult Post Acute Care Choice: [place]          DME Arranged: N/A DME Agency: NA       HH Arranged: NA [place] Agency: NA        Social Determinants of Health (SDOH) Interventions     Readmission Risk Interventions No flowsheet data found.

## 2019-09-09 NOTE — Progress Notes (Signed)
Inpatient Diabetes Program Recommendations  AACE/ADA: New Consensus Statement on Inpatient Glycemic Control (2015)  Target Ranges:  Prepandial:   less than 140 mg/dL      Peak postprandial:   less than 180 mg/dL (1-2 hours)      Critically ill patients:  140 - 180 mg/dL   Results for Heather Petty, Heather Petty (MRN [id number]) as of 09/09/2019 09:04  Ref. Range 09/09/2019 07:24  Glucose-Capillary Latest Ref Range: 70 - 99 mg/dL 59 (L)     Home DM Meds: Lantus 25 units QHS       Novolog 0-12 units QID per SSI       Glipizide 10 mg BID  Current Orders: Lantus 15 units QHS      Novolog Sensitive Correction Scale/ SSI (0-9 units) TID AC     MD- Note patient with Hypoglycemia this AM after getting 15 units Lantus last PM.  Please consider further reduction of Lantus to 10 units QHS     --Will follow patient during hospitalization--  Wyn Quaker RN, MSN, CDE Diabetes Coordinator Inpatient Glycemic Control Team Team Pager: [phone] (8a-5p)

## 2019-09-09 NOTE — Discharge Summary (Signed)
Physician Discharge Summary  Heather Petty YHC:[id number] DOB: 1950/03/10 DOA: 09/02/2019  PCP: System, Pcp Not In  Admit date: 09/02/2019 Discharge date: 09/09/2019  Admitted From: SNF  Discharge disposition: SNF   Recommendations for Outpatient Follow-Up:   . Follow up with your primary care provider at the skilled nursing facility in 3 to 5 days . Check CBC, CMP, magnesium in the next visit. . Obtain chest x-ray in 3 to 4 weeks to ensure resolution of pneumonia.  Patient has completed 5-day course of antibiotic for pneumonia. . Lantus dose has been decreased to 10 units from 25 units due to episodes of hypoglycemia. . Patient to follow-up with GI as has been scheduled for elevated LFTs. . Patient would benefit from outpatient psychiatric follow-up for her history of schizophrenia.   Discharge Diagnosis:   Principal Problem:   CAP (community acquired pneumonia) Active Problems:   Uncontrolled type 2 diabetes mellitus with hyperglycemia (HCC)   Elevated LFTs   Essential hypertension   Gout   Asthma   Anemia   Pneumonia   Discharge Condition: Improved.  Diet recommendation: Low sodium, heart healthy.  Carbohydrate-modified.    Wound care: None.  Code status: Full.   History of Present Illness:  Heather Petty a 69 y.o.femalewithhistory of diabetes mellitus type 2, hypertension, gout, asthma who was recently admitted to the hospital for MRSA and enterococcal bacteremia patient completed her antibiotic course of 6 weeks 2 days ago and her PICC line was removed also was recently admitted for anemia requiring blood transfusion and gastroenterology was planning outpatient colonoscopy also was found to have elevated LFTs during the recent admissions work-up so far was negative presents to the ER from nursing home after patient was having shortness of breath with productive cough for the last 3 days. Has some epigastric discomfort no nausea vomiting also has some  chest pressure off and on. In the ER, patient with temperature of 101.7 F lactic acid was normal WBC count was 10 hemoglobin 10 EKG was showing normal sinus rhythm. Covid test was negative chest x-ray showing bilateral infiltrates concerning for multifocal pneumonia. Patient's labs also showed markedly elevated alkaline phosphatase at 1055 which is increased from her recent past. Total bilirubin of 2.1 AST was 254 ALT 214. Patient was started on empiric antibiotics admitted for pneumonia which is multifocal with markedly elevated LFTs. Ultrasonogram of the abdomen shows gallbladder sludge. No definite evidence of any cholecystitis.  Patient was then admitted to hospital for further evaluation and treatment.   Hospital Course:   Following conditions were addressed during hospitalization as listed below,  Community-acquired pneumonia.Multifocal pneumonia with acute hypoxic respiratory failure. Likely bacterial pneumonia.  Improved at this time.  Completed 5-day course of antibiotic.  Consider x-ray of the chest in 3 to 4 weeks to ensure resolution of pneumonia.  Calcitonin was elevated on presentation.  Blood cultures were negative.  Patient was on linezolid and cefepime during hospitalization.  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  Patient was seen by speech therapy who recommended regular consistency diet.  At this time patient is afebrile, no leukocytosis.  She was also seen by infectious disease during hospitalization due to pneumonia and recent bacteremia.    Antibiotic associated diarrhea.  Improved at this time.  Antibiotics have been discontinued. Consider loperamide if frequent diarrhea or could add probiotic as well.   Elevated LFTsPatient has had recent work-up including MRI and hepatic serologies which were unremarkable.GI was supposed  to follow up as outpatient which has been scheduled from [facility] .  LFTs trended down during  hospitalization.  Recent admission for enterococcal and MRSA bacteremiajust finished 6 weeks course of IV antibiotics 2 days prior to admission and PICC line was removed. ID on board.  WBC of 13.3>11.8>8.9.  Blood cultures negative this admission.  Diabetes mellitus type 2. With episodes of hypoglycemia. Last known hemoglobin A1c of 10.4 on June 2021. Continue Lantus (decreased dose to 10 unit from twenty-five), sliding scale insulin,Accu-Cheks diabetic diet.     History of gouton allopurinol.  Essential  hypertensionon nadolol and amlodipine.  History of asthma. Continue  albuterol.  On room air.  Chronic simple anemia.History of blood transfusion. Hemoglobin of 7.9. Monitor CBC periodically.  Mild hypokalemia.replenished.  Potassium of 3.7   Mild hypomagnesemia.  Replenished.  Magnesium 1.7  History of schizophrenia. Used to follow with psychiatry in the past. On risperidone. Encouraged psychiatry follow up as outpatient.  Right shoulder/arm pain.  History of recent fall. .  Negative x-ray of the right shoulder for fracture. Ultrasound of the right upper extremity was negative for DVT.  Warm compression menthol cream and Tylenol as needed.  Disposition.  At this time, patient is stable for disposition to skilled nursing facility.  Medical Consultants:    Infectious disease  Procedures:    None Subjective:   Today, patient feels okay.  Denies nausea, vomiting, abdominal pain, fever, shortness of breath or chest pain.  Discharge Exam:   Vitals:   09/09/19 0722 09/09/19 0801  BP: (!) 111/50   Pulse:    Resp:    Temp: 98 F (36.7 C)   SpO2: 98% 95%   Vitals:   09/08/19 2304 09/09/19 0324 09/09/19 0722 09/09/19 0801  BP: (!) 120/50 (!) 117/46 (!) 111/50   Pulse: 70     Resp:  16    Temp:  98.5 F (36.9 C) 98 F (36.7 C)   TempSrc:  Oral Oral   SpO2:   98% 95%   General: Alert awake, not in obvious distress, on room air, obese HENT:  pupils equally reacting to light,  No scleral pallor or icterus noted. Oral mucosa is moist.  Chest:    Diminished breath sounds bilaterally.  CVS: S1 &S2 heard. No murmur.  Regular rate and rhythm. Abdomen: Soft, nontender, nondistended.  Bowel sounds are heard.   Extremities: No cyanosis, clubbing or edema.  Peripheral pulses are palpable. Psych: Alert, awake and oriented, normal mood CNS:  No cranial nerve deficits.  Power equal in all extremities.   Skin: Warm and dry.  No rashes noted.  The results of significant diagnostics from this hospitalization (including imaging, microbiology, ancillary and laboratory) are listed below for reference.     Diagnostic Studies:   DG Chest 2 View  Result Date: 09/02/2019 CLINICAL DATA:  Fever EXAM: CHEST - 2 VIEW COMPARISON:  08/10/2019 FINDINGS: Increased ground-glass opacity within the peripheral right upper lobe with possible small foci of peripheral airspace disease at the left base. No pleural effusion. Stable cardiomediastinal silhouette with aortic atherosclerosis. No pneumothorax. IMPRESSION: Increased peripheral right upper lobe ground-glass opacity with possible small foci of peripheral airspace disease at the left base, findings are suspicious for multifocal pneumonia, possible atypical or viral pneumonia. Electronically Signed   By: Donavan Foil M.D.   On: 09/02/2019 19:46   US Abdomen Limited  Result Date: 09/03/2019 CLINICAL DATA:  Initial evaluation for elevated LFTs. EXAM: ULTRASOUND ABDOMEN LIMITED RIGHT UPPER QUADRANT COMPARISON:  Prior ultrasound from 08/05/2019 and MRI from 08/08/2019 FINDINGS: Gallbladder: Subtle echogenic nonshadowing material within the gallbladder lumen suggestive of a small amount of sludge. No frank shadowing stones. Gallbladder wall measures within normal limits at 3 mm. No free pericholecystic fluid. No sonographic Murphy sign elicited on exam. Common bile duct: Diameter: 5 mm Liver: No focal lesion identified.  Liver demonstrates a heterogeneous echogenic echotexture, suggesting steatosis. Portal vein is patent on color Doppler imaging with normal direction of blood flow towards the liver. Other: None. IMPRESSION: 1. Mild gallbladder sludging without frank cholelithiasis. No sonographic features to suggest acute cholecystitis. 2. No biliary dilatation. 3. Coarse echogenic echotexture of the liver, suggesting steatosis. Electronically Signed   By: Jeannine Boga M.D.   On: 09/03/2019 02:53     Labs:   Basic Metabolic Panel: Recent Labs  Lab 09/04/19 0142 09/04/19 0142 09/04/19 2056 09/04/19 2056 09/05/19 3532 09/05/19 0657 09/06/19 0148 09/06/19 0148 09/07/19 0210 09/08/19 0050  NA 137   < > 138  --  137  --  139  --  140 140  K 4.8   < > 4.2   < > 4.5   < > 3.9   < > 3.9 3.7  CL 105   < > 110  --  108  --  111  --  109 110  CO2 22   < > 17*  --  19*  --  20*  --  22 23  GLUCOSE 225*   < > 133*  --  168*  --  129*  --  73 86  BUN 24*   < > 27*  --  30*  --  23  --  16 13  CREATININE 0.90   < > 1.40*  --  1.39*  --  0.99  --  0.78 0.79  CALCIUM 8.8*   < > 8.3*  --  8.5*  --  8.5*  --  8.8* 8.7*  MG 1.7  --   --   --  1.2*  --  1.3*  --   --  1.7  PHOS  --   --   --   --  3.8  --   --   --   --   --    < > = values in this interval not displayed.   GFR CrCl cannot be calculated (Unknown ideal weight.). Liver Function Tests: Recent Labs  Lab 09/03/19 0742 09/04/19 0142 09/04/19 2056 09/05/19 0657 09/06/19 0148  AST 89* 81* 61* 71* 36  ALT 142* 117* 90* 99* 79*  ALKPHOS 806* 705* 572* 505* 493*  BILITOT 1.7* 1.5* 1.8* 1.8* 1.4*  PROT 5.7* 5.4* 4.8* 4.7* 4.4*  ALBUMIN 2.3* 2.2* 2.0* 1.9* 1.8*   Recent Labs  Lab 09/03/19 0742  LIPASE 25   No results for input(s): AMMONIA in the last 168 hours. Coagulation profile Recent Labs  Lab 09/03/19 0742  INR 1.1    CBC: Recent Labs  Lab 09/02/19 1917 09/02/19 1917 09/03/19 0742 09/04/19 0142 09/04/19 2056  09/04/19 2056 09/05/19 0657 09/06/19 0148 09/06/19 1525 09/07/19 0210 09/08/19 0050  WBC 10.0   < > 7.4   < > 17.3*   < > 13.3* 11.8* 9.9 10.0 8.9  NEUTROABS 6.5  --  4.0  --  14.7*  --   --   --   --   --   --   HGB 10.0*   < > 8.3*   < > 9.6*   < >  9.1* 7.7* 8.9* 8.2* 7.9*  HCT 32.4*   < > 26.7*   < > 30.5*   < > 28.9* 23.8* 29.0* 26.6* 25.8*  MCV 95.0   < > 94.3   < > 95.6   < > 94.8 93.7 96.0 94.7 95.9  PLT 255   < > 225   < > 284   < > 255 236 261 260 264   < > = values in this interval not displayed.   Cardiac Enzymes: No results for input(s): CKTOTAL, CKMB, CKMBINDEX, TROPONINI in the last 168 hours. BNP: Invalid input(s): POCBNP CBG: Recent Labs  Lab 09/08/19 1206 09/08/19 1619 09/08/19 2104 09/09/19 0724 09/09/19 0856  GLUCAP 166* 97 174* 59* 76   D-Dimer No results for input(s): DDIMER in the last 72 hours. Hgb A1c No results for input(s): HGBA1C in the last 72 hours. Lipid Profile No results for input(s): CHOL, HDL, LDLCALC, TRIG, CHOLHDL, LDLDIRECT in the last 72 hours. Thyroid function studies No results for input(s): TSH, T4TOTAL, T3FREE, THYROIDAB in the last 72 hours.  Invalid input(s): FREET3 Anemia work up No results for input(s): VITAMINB12, FOLATE, FERRITIN, TIBC, IRON, RETICCTPCT in the last 72 hours. Microbiology Recent Results (from the past 240 hour(s))  Culture, blood (routine x 2)     Status: None   Collection Time: 09/03/19  1:46 AM   Specimen: BLOOD RIGHT HAND  Result Value Ref Range Status   Specimen Description BLOOD RIGHT HAND  Final   Special Requests   Final    BOTTLES DRAWN AEROBIC AND ANAEROBIC Blood Culture results may not be optimal due to an inadequate volume of blood received in culture bottles   Culture   Final    NO GROWTH 5 DAYS Performed at [facility], [place] [address], [place], [place] [address]    Report Status 09/08/2019 FINAL  Final  Culture, blood (routine x 2)     Status: None   Collection Time: 09/03/19   2:40 AM   Specimen: BLOOD  Result Value Ref Range Status   Specimen Description BLOOD SITE NOT SPECIFIED  Final   Special Requests   Final    BOTTLES DRAWN AEROBIC AND ANAEROBIC Blood Culture results may not be optimal due to an inadequate volume of blood received in culture bottles   Culture   Final    NO GROWTH 5 DAYS Performed at [facility], [place] [address], [place], [place] [address]    Report Status 09/08/2019 FINAL  Final  SARS Coronavirus 2 by RT PCR (hospital order, performed in [facility] hospital lab) Nasopharyngeal Nasopharyngeal Swab     Status: None   Collection Time: 09/03/19  3:00 AM   Specimen: Nasopharyngeal Swab  Result Value Ref Range Status   SARS Coronavirus 2 NEGATIVE NEGATIVE Final    Comment: (NOTE) SARS-CoV-2 target nucleic acids are NOT DETECTED.  The SARS-CoV-2 RNA is generally detectable in upper and lower respiratory specimens during the acute phase of infection. The lowest concentration of SARS-CoV-2 viral copies this assay can detect is 250 copies / mL. A negative result does not preclude SARS-CoV-2 infection and should not be used as the sole basis for treatment or other patient management decisions.  A negative result may occur with improper specimen collection / handling, submission of specimen other than nasopharyngeal swab, presence of viral mutation(s) within the areas targeted by this assay, and inadequate number of viral copies (<250 copies / mL). A negative result must be combined with  clinical observations, patient history, and epidemiological information.  Fact Sheet for Patients:   StrictlyIdeas.no  Fact Sheet for Healthcare Providers: BankingDealers.co.za  This test is not yet approved or  cleared by the [place] FDA and has been authorized for detection and/or diagnosis of SARS-CoV-2 by FDA under an Emergency Use Authorization (EUA).  This EUA will remain in effect (meaning  this test can be used) for the duration of the COVID-19 declaration under Section 564(b)(1) of the Act, 21 U.S.C. section 360bbb-3(b)(1), unless the authorization is terminated or revoked sooner.  Performed at [facility], [place] [address], [place], [place] [address]   Culture, Urine     Status: None   Collection Time: 09/05/19  5:03 AM   Specimen: Urine, Clean Catch  Result Value Ref Range Status   Specimen Description URINE, CLEAN CATCH  Final   Special Requests NONE  Final   Culture   Final    NO GROWTH Performed at [facility], [place] [address] [place], [place] [address]    Report Status 09/06/2019 FINAL  Final  Culture, blood (Routine X 2) w Reflex to ID Panel     Status: None (Preliminary result)   Collection Time: 09/05/19  6:57 AM   Specimen: BLOOD LEFT ARM  Result Value Ref Range Status   Specimen Description BLOOD LEFT ARM  Final   Special Requests   Final    BOTTLES DRAWN AEROBIC ONLY Blood Culture results may not be optimal due to an inadequate volume of blood received in culture bottles   Culture   Final    NO GROWTH 3 DAYS Performed at [facility], [place] [address], [place], [place] [address]    Report Status PENDING  Incomplete  Culture, blood (Routine X 2) w Reflex to ID Panel     Status: None (Preliminary result)   Collection Time: 09/05/19  6:59 AM   Specimen: BLOOD LEFT HAND  Result Value Ref Range Status   Specimen Description BLOOD LEFT HAND  Final   Special Requests   Final    BOTTLES DRAWN AEROBIC ONLY Blood Culture results may not be optimal due to an inadequate volume of blood received in culture bottles   Culture   Final    NO GROWTH 3 DAYS Performed at [facility], [place] [address], [place], [place] [address]    Report Status PENDING  Incomplete  SARS Coronavirus 2 by RT PCR (hospital order, performed in [facility] hospital lab) Nasopharyngeal Nasopharyngeal Swab     Status: None   Collection Time: 09/08/19  1:45 PM    Specimen: Nasopharyngeal Swab  Result Value Ref Range Status   SARS Coronavirus 2 NEGATIVE NEGATIVE Final    Comment: (NOTE) SARS-CoV-2 target nucleic acids are NOT DETECTED.  The SARS-CoV-2 RNA is generally detectable in upper and lower respiratory specimens during the acute phase of infection. The lowest concentration of SARS-CoV-2 viral copies this assay can detect is 250 copies / mL. A negative result does not preclude SARS-CoV-2 infection and should not be used as the sole basis for treatment or other patient management decisions.  A negative result may occur with improper specimen collection / handling, submission of specimen other than nasopharyngeal swab, presence of viral mutation(s) within the areas targeted by this assay, and inadequate number of viral copies (<250 copies / mL). A negative result must be combined with clinical observations, patient history, and epidemiological information.  Fact Sheet for Patients:   StrictlyIdeas.no  Fact Sheet for Healthcare Providers: BankingDealers.co.za  This test is not yet approved or  cleared by the [place] FDA and has been authorized for detection and/or diagnosis of SARS-CoV-2 by FDA under an Emergency Use Authorization (EUA).  This EUA will remain in effect (meaning this test can be used) for the duration of the COVID-19 declaration under Section 564(b)(1) of the Act, 21 U.S.C. section 360bbb-3(b)(1), unless the authorization is terminated or revoked sooner.  Performed at [facility], [place] [address], [place], [place] [address]      Discharge Instructions:   Discharge Instructions    Call MD for:  difficulty breathing, headache or visual disturbances   Complete by: As directed    Call MD for:  temperature >100.4   Complete by: As directed    Diet Carb Modified   Complete by: As directed    Discharge instructions   Complete by: As directed    Follow-up with  your primary care provider at the skilled nursing facility in 3 to 5 days.   Increase activity slowly   Complete by: As directed      Allergies as of 09/09/2019      Reactions   Vancomycin Other (See Comments)   Patient becomes hypotensive, lethargic and itchy    Lasix [furosemide] Other (See Comments)   On MAR      Medication List    TAKE these medications   acetaminophen 325 MG tablet Commonly known as: TYLENOL Take 2 tablets (650 mg total) by mouth every 6 (six) hours as needed for moderate pain or fever (headache).   albuterol 108 (90 Base) MCG/ACT inhaler Commonly known as: VENTOLIN HFA Inhale 1-2 puffs into the lungs every 4 (four) hours as needed for wheezing or shortness of breath.   allopurinol 300 MG tablet Commonly known as: ZYLOPRIM Take 300 mg by mouth daily.   amLODipine 10 MG tablet Commonly known as: NORVASC Take 10 mg by mouth daily.   ascorbic acid 500 MG tablet Commonly known as: VITAMIN C Take 500 mg by mouth daily.   b complex vitamins tablet Take 1 tablet by mouth daily.   Biofreeze 4 % Gel Generic drug: Menthol (Topical Analgesic) Apply 1 application topically daily. To right and left knees   bisacodyl 5 MG EC tablet Commonly known as: DULCOLAX Take 5 mg by mouth daily as needed for moderate constipation.   CeraVe Crea Apply 1 application topically in the morning and at bedtime. To feet, legs, trunk, arms and hands   diphenhydrAMINE 2 % cream Commonly known as: BENADRYL Apply topically 3 (three) times daily as needed for itching.   doxepin 25 MG capsule Commonly known as: SINEQUAN Take 25 mg by mouth at bedtime.   Dymista 137-50 MCG/ACT Susp Generic drug: Azelastine-Fluticasone Place 1 spray into both nostrils 2 (two) times daily as needed (for allergies).   eucerin cream Apply 1 application topically in the morning and at bedtime.   famotidine 20 MG tablet Commonly known as: PEPCID Take 20 mg by mouth 2 (two) times daily.    feeding supplement (GLUCERNA SHAKE) Liqd Take 237 mLs by mouth daily.   folic acid 1 MG tablet Commonly known as: FOLVITE Take 1 mg by mouth daily.   glipiZIDE 10 MG tablet Commonly known as: GLUCOTROL Take 10 mg by mouth 2 (two) times daily before a meal.   hydrocortisone 2.5 % cream Apply 1 application topically 2 (two) times daily. To face rash   hydrOXYzine 25 MG tablet  Commonly known as: ATARAX/VISTARIL Take 25 mg by mouth in the morning and at bedtime.   insulin aspart 100 UNIT/ML injection Commonly known as: novoLOG Inject 0-12 Units into the skin 4 (four) times daily -  with meals and at bedtime. If blood sugar is less than 60= call doctor 60-200= 0 units 201-250= 2 units 251-300= 4 units 301-350= 6 units 351-400= 8 units 401-450= 10 units If greater than 450= give 12 units then after administering 12 units check CBG in 2 hours, if over 400 contact MD.   insulin glargine 100 UNIT/ML injection Commonly known as: LANTUS Inject 0.1 mLs (10 Units total) into the skin at bedtime. What changed: how much to take   ipratropium-albuterol 0.5-2.5 (3) MG/3ML Soln Commonly known as: DUONEB Take 3 mLs by nebulization every 6 (six) hours as needed (for wheezing).   lidocaine 5 % Commonly known as: LIDODERM Place 1 patch onto the skin daily. Remove & Discard patch within 12 hours or as directed by MD   loperamide 2 MG capsule Commonly known as: IMODIUM Take 1 capsule (2 mg total) by mouth as needed for diarrhea or loose stools.   multivitamin with minerals Tabs tablet Take 1 tablet by mouth daily.   nadolol 20 MG tablet Commonly known as: CORGARD Take 20 mg by mouth daily.   nystatin powder Commonly known as: MYCOSTATIN/NYSTOP Apply 1 application topically 2 (two) times daily. Between folds, beneath breast, bilataxillae, under pannus   polyethylene glycol 17 g packet Commonly known as: MIRALAX / GLYCOLAX Take 17 g by mouth daily as needed for mild constipation.    risperiDONE 0.5 MG tablet Commonly known as: RISPERDAL Take 0.5 mg by mouth 2 (two) times daily.   Systane Ultra 0.4-0.3 % Soln Generic drug: Polyethyl Glycol-Propyl Glycol Place 2 drops into both eyes in the morning and at bedtime.   zinc sulfate 220 (50 Zn) MG capsule Take 220 mg by mouth at bedtime.       Time coordinating discharge: 39 minutes  Signed:  Mariah Harn  [facility] 09/09/2019, 9:59 AM

## 2019-09-09 NOTE — Progress Notes (Signed)
PT Cancellation Note  Patient Details Name: Heather Petty MRN: [id number] DOB: [birth year]   Cancelled Treatment:    Reason Eval/Treat Not Completed: Other (comment) per CSW patient for sure leaving for SNF today. Holding therapy per dept protocol-  If patient has urgent rehab needs, please direct message this therapist in [place] (8am-4pm) or call acute rehab office at number below.     Windell Norfolk, DPT, PN1   Supplemental Physical Therapist [facility]    Pager [phone] Acute Rehab Office [phone]

## 2019-09-10 LAB — CULTURE, BLOOD (ROUTINE X 2)
Culture: NO GROWTH
Culture: NO GROWTH

## 2019-09-25 ENCOUNTER — Other Ambulatory Visit: Payer: Self-pay

## 2019-09-25 ENCOUNTER — Ambulatory Visit (INDEPENDENT_AMBULATORY_CARE_PROVIDER_SITE_OTHER): Payer: Medicare Other | Admitting: Internal Medicine

## 2019-09-25 DIAGNOSIS — B952 Enterococcus as the cause of diseases classified elsewhere: Secondary | ICD-10-CM | POA: Diagnosis not present

## 2019-09-25 DIAGNOSIS — B9562 Methicillin resistant Staphylococcus aureus infection as the cause of diseases classified elsewhere: Secondary | ICD-10-CM

## 2019-09-25 DIAGNOSIS — B372 Candidiasis of skin and nail: Secondary | ICD-10-CM | POA: Diagnosis not present

## 2019-09-25 DIAGNOSIS — R7881 Bacteremia: Secondary | ICD-10-CM

## 2019-09-25 NOTE — Assessment & Plan Note (Signed)
I believe her bacteremia has been cured.

## 2019-09-25 NOTE — Assessment & Plan Note (Signed)
I recommended fluconazole 100 mg by mouth daily for 7 days in addition to topical nystatin powder.

## 2019-09-25 NOTE — Progress Notes (Signed)
[facility] for Infectious Disease  Patient Active Problem List   Diagnosis Date Noted  . MRSA bacteremia 07/22/2019    Priority: High  . Enterococcal bacteremia 07/22/2019    Priority: High  . Sepsis ([place]) 07/19/2018    Priority: High  . Candidal intertrigo 09/25/2019  . CAP (community acquired pneumonia) 09/03/2019  . Uncontrolled type 2 diabetes mellitus with hyperglycemia ([place]) 09/03/2019  . Elevated LFTs 09/03/2019  . Essential hypertension 09/03/2019  . Gout 09/03/2019  . Asthma 09/03/2019  . Anemia 09/03/2019  . Pneumonia 09/03/2019  . Splenomegaly   . Anemia 08/05/2019  . Diarrhea 08/05/2019  . DKA (diabetic ketoacidoses) ([place]) 07/22/2019  . Rhabdomyolysis 07/22/2019  . Acute metabolic encephalopathy 59/93/5701  . Vitiligo 07/22/2019  . 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 Ventricular tachycardia, sustained ([place]) 07/19/2018  . NSTEMI (non-ST elevated myocardial infarction) ([place]) 07/19/2018  . Wound infection 04/03/2018  . Morbid obesity with BMI of 50.0-59.9, adult ([place]) 04/03/2018  . Elevated alkaline phosphatase level 04/03/2018  . Asthma exacerbation 03/14/2018  . Hypotension 03/14/2018  . Lactic acidosis 03/14/2018  . GERD (gastroesophageal reflux disease) 03/14/2018  . Acute renal failure superimposed on stage 3 chronic kidney disease ([place]) 03/14/2018  . Macrocytic anemia 03/14/2018  . Abnormal LFTs 03/14/2018  . Acute kidney injury ([place]) 02/28/2018  . Wound of sacral region 02/28/2018  . Hypothermia 02/28/2018  . Stage II pressure ulcer of sacral region ([place]) 02/15/2018  . DM II (diabetes mellitus, type II), controlled ([place]) 02/09/2018  . Schizophrenia ([place]) 02/09/2018  . Pressure ulcer 02/06/2018  . Acute renal failure (ARF) ([place]) 02/05/2018  . Hyperlipidemia associated with type 2 diabetes mellitus ([place]) 02/05/2018  . Hyperkalemia 02/05/2018  . Dermatitis 02/05/2018  . Open back wound 02/05/2018  . Dysuria 02/05/2018  . Paranoid  schizophrenia ([place])   . AKI (acute kidney injury) ([place]) 12/17/2017  . Type II diabetes mellitus with renal manifestations ([place]) 12/17/2017  . HTN (hypertension) 12/17/2017  . Gout     Patient's Medications  New Prescriptions   No medications on file  Previous Medications   ACETAMINOPHEN (TYLENOL) 325 MG TABLET    Take 2 tablets (650 mg total) by mouth every 6 (six) hours as needed for moderate pain or fever (headache).   ALBUTEROL (VENTOLIN HFA) 108 (90 BASE) MCG/ACT INHALER    Inhale 2 puffs into the lungs every 4 (four) hours as needed for wheezing or shortness of breath.   ALBUTEROL (VENTOLIN HFA) 108 (90 BASE) MCG/ACT INHALER    Inhale 1-2 puffs into the lungs every 4 (four) hours as needed for wheezing or shortness of breath.   ALLOPURINOL (ZYLOPRIM) 300 MG TABLET    Take 300 mg by mouth daily.   ALLOPURINOL (ZYLOPRIM) 300 MG TABLET    Take 300 mg by mouth daily.   AMLODIPINE (NORVASC) 10 MG TABLET    Take 10 mg by mouth daily.   AMLODIPINE (NORVASC) 10 MG TABLET    Take 10 mg by mouth daily.   ASCORBIC ACID (VITAMIN C) 500 MG TABLET    Take 500 mg by mouth daily.   AZELASTINE-FLUTICASONE (DYMISTA) 137-50 MCG/ACT SUSP    Place 1 spray into both nostrils 2 (two) times daily as needed (for allergies).    AZELASTINE-FLUTICASONE (DYMISTA) 137-50 MCG/ACT SUSP    Place 1 spray into both nostrils 2 (two) times daily as needed (for allergies).   B COMPLEX VITAMINS TABLET    Take 1 tablet by mouth daily.  B COMPLEX VITAMINS TABLET    Take 1 tablet by mouth daily.   BISACODYL (DULCOLAX) 5 MG EC TABLET    Take 1 tablet (5 mg total) by mouth daily as needed for moderate constipation.   BISACODYL (DULCOLAX) 5 MG EC TABLET    Take 5 mg by mouth daily as needed for moderate constipation.   COLLAGENASE (SANTYL) OINTMENT    Apply topically daily.   COLLOIDAL OATMEAL (EUCERIN ECZEMA RELIEF) 1 % CREA    Apply 1 application topically 2 (two) times daily.   DIPHENHYDRAMINE (BENADRYL) 2 % CREAM    Apply 1  application topically 3 (three) times daily as needed for itching.   DIPHENHYDRAMINE (BENADRYL) 2 % CREAM    Apply topically 3 (three) times daily as needed for itching.   DOXEPIN (SINEQUAN) 25 MG CAPSULE    Take 25 mg by mouth at bedtime.    DOXEPIN (SINEQUAN) 25 MG CAPSULE    Take 25 mg by mouth at bedtime.   EMOLLIENT (CERAVE) CREA    Apply 1 application topically in the morning and at bedtime. To feet, legs, trunk, arms and hands   FAMOTIDINE (PEPCID) 20 MG TABLET    Take 20 mg by mouth 2 (two) times daily.   FAMOTIDINE (PEPCID) 20 MG TABLET    Take 20 mg by mouth 2 (two) times daily.   FEEDING SUPPLEMENT, GLUCERNA SHAKE, (GLUCERNA SHAKE) LIQD    Take 237 mLs by mouth daily.   FOLIC ACID (FOLVITE) 1 MG TABLET    Take 1 mg by mouth daily.   FOLIC ACID (FOLVITE) 1 MG TABLET    Take 1 mg by mouth daily.   GLIPIZIDE (GLUCOTROL XL) 10 MG 24 HR TABLET    Take 10 mg by mouth 2 (two) times daily.    GLIPIZIDE (GLUCOTROL) 10 MG TABLET    Take 10 mg by mouth 2 (two) times daily before a meal.   HYDROCORTISONE 2.5 % CREAM    Apply 1 application topically See admin instructions. Apply topically to rash on face twice daily   HYDROCORTISONE 2.5 % CREAM    Apply 1 application topically 2 (two) times daily. To face rash   HYDROXYZINE (ATARAX/VISTARIL) 25 MG TABLET    Take 25 mg by mouth 2 (two) times daily.    HYDROXYZINE (ATARAX/VISTARIL) 25 MG TABLET    Take 25 mg by mouth in the morning and at bedtime.   INSULIN ASPART (NOVOLOG) 100 UNIT/ML INJECTION    Inject 0-12 Units into the skin See admin instructions. Inject 0-12 units into the skin 4 times a day (before meals and at bedtime) per sliding scale: BGL <60 or >400  = CALL MD; 60-200 = 0 units; 201-250 = 2 units; 251-300 = 4 units; 301-350 = 6 units; 351-400 = 8 units; 401-450 = 10 units; >450 = 12 units   INSULIN ASPART (NOVOLOG) 100 UNIT/ML INJECTION    Inject 0-12 Units into the skin 4 (four) times daily -  with meals and at bedtime. If blood sugar is  less than 60= call doctor 60-200= 0 units 201-250= 2 units 251-300= 4 units 301-350= 6 units 351-400= 8 units 401-450= 10 units If greater than 450= give 12 units then after administering 12 units check CBG in 2 hours, if over 400 contact MD.   INSULIN GLARGINE (LANTUS) 100 UNIT/ML INJECTION    Inject 0.25 mLs (25 Units total) into the skin at bedtime.   INSULIN GLARGINE (LANTUS) 100 UNIT/ML INJECTION  Inject 0.1 mLs (10 Units total) into the skin at bedtime.   IPRATROPIUM-ALBUTEROL (DUONEB) 0.5-2.5 (3) MG/3ML SOLN    Take 3 mLs by nebulization every 6 (six) hours as needed (for shortness of breath or wheezing).    IPRATROPIUM-ALBUTEROL (DUONEB) 0.5-2.5 (3) MG/3ML SOLN    Take 3 mLs by nebulization every 6 (six) hours as needed (for wheezing).   LIDOCAINE (LIDODERM) 5 %    Place 1 patch onto the skin daily. Remove & Discard patch within 12 hours or as directed by MD   LOPERAMIDE (IMODIUM) 2 MG CAPSULE    Take 1 capsule (2 mg total) by mouth as needed for diarrhea or loose stools.   MENTHOL, TOPICAL ANALGESIC, (BIOFREEZE) 4 % GEL    Apply 1 application topically daily. To right and left knees   MULTIPLE VITAMIN (MULTIVITAMIN WITH MINERALS) TABS TABLET    Take 1 tablet by mouth daily.   MULTIPLE VITAMINS-MINERALS (THERA-M MULTIPLE VITAMINS PO)    Take 1 tablet by mouth daily.   NADOLOL (CORGARD) 20 MG TABLET    Take 1 tablet (20 mg total) by mouth daily.   NADOLOL (CORGARD) 20 MG TABLET    Take 20 mg by mouth daily.   NYSTATIN (MYCOSTATIN/NYSTOP) POWDER    Apply 1 g topically See admin instructions. Apply topically twice daily as needed for wound care  -  between folds, beneath breasts, bilateral axillae, and under pannus   NYSTATIN (MYCOSTATIN/NYSTOP) POWDER    Apply 1 application topically 2 (two) times daily. Between folds, beneath breast, bilataxillae, under pannus   POLYETHYL GLYCOL-PROPYL GLYCOL (SYSTANE ULTRA) 0.4-0.3 % SOLN    Place 2 drops into both eyes in the morning and at bedtime.     POLYETHYLENE GLYCOL (MIRALAX / GLYCOLAX) 17 G PACKET    Take 17 g by mouth daily as needed for mild constipation.   POLYETHYLENE GLYCOL (MIRALAX / GLYCOLAX) 17 G PACKET    Take 17 g by mouth daily as needed for mild constipation.   RISPERIDONE (RISPERDAL) 0.5 MG TABLET    Take 1 tablet (0.5 mg total) by mouth 2 (two) times daily.   RISPERIDONE (RISPERDAL) 0.5 MG TABLET    Take 0.5 mg by mouth 2 (two) times daily.   SKIN PROTECTANTS, MISC. (EUCERIN) CREAM    Apply 1 application topically in the morning and at bedtime.   VITAMIN C (ASCORBIC ACID) 500 MG TABLET    Take 500 mg by mouth 2 (two) times daily.   VITAMIN D, ERGOCALCIFEROL, (DRISDOL) 1.25 MG (50000 UT) CAPS CAPSULE    Take 50,000 Units by mouth every Monday.    ZINC SULFATE (ZINC-220 PO)    Take 220 mg by mouth at bedtime.    ZINC SULFATE 220 (50 ZN) MG CAPSULE    Take 220 mg by mouth at bedtime.  Modified Medications   No medications on file  Discontinued Medications   No medications on file    Subjective: Heather Petty is in for her hospital follow-up visit.  She was hospitalized 2 months ago with fever and encephalopathy.  She was found to have MRSA and enterococcal bacteremia.  Repeat blood cultures were negative.  There was no evidence of endocarditis on TTE.  She refused TEE so I elected to treat her with 6 weeks of IV therapy.  She developed a rash on vancomycin and was switched to daptomycin.  She was supposed to continue daptomycin through today for a total of 6 weeks of treatment.  Records that came  with her today from [facility] indicates that after discharge she was changed back to vancomycin.  She does not know why.  She completed antibiotic therapy on 09/01/2019.  She is feeling better.  She has not had any fever.  She has been bothered by a slightly pruritic rash under her right arm, both breasts and abdominal folds.  Review of Systems: Review of Systems  Constitutional: Negative for fever.  Respiratory: Positive for  cough. Negative for sputum production and shortness of breath.   Cardiovascular: Negative for chest pain.  Gastrointestinal: Negative for abdominal pain, diarrhea, nausea and vomiting.  Musculoskeletal: Negative for back pain.  Skin: Positive for itching and rash.       Desquamation of her palms.    Past Medical History:  Diagnosis Date  . AKI (acute kidney injury) ([place]) 12/17/2017  . Arthritis   . Asthma   . Depression   . Diabetes mellitus   . Diabetes mellitus without complication ([place])   . Gout   . High cholesterol   . Hypertension   . Morbid obesity ([place])   . Pneumonia 12/17/2017  . Schizophrenia ([place])   . Stroke [facility])     Social History   Tobacco Use  . Smoking status: Never Smoker  . Smokeless tobacco: Never Used  Vaping Use  . Vaping Use: Never used  Substance Use Topics  . Alcohol use: Never  . Drug use: Never    Family History  Family history unknown: Yes    Allergies  Allergen Reactions  . Vancomycin Other (See Comments)    Patient becomes hypotensive, lethargic and itchy   . Lasix [Furosemide] Other (See Comments)    IV-LASIX ==> Reaction: Burning  . Lasix [Furosemide] Other (See Comments)    On MAR    Objective: There were no vitals filed for this visit. There is no height or weight on file to calculate BMI.  Physical Exam Constitutional:      Comments: She is seated in a wheelchair.  Cardiovascular:     Rate and Rhythm: Normal rate and regular rhythm.     Heart sounds: No murmur heard.   Pulmonary:     Effort: Pulmonary effort is normal.     Breath sounds: Normal breath sounds.  Abdominal:     Palpations: Abdomen is soft.     Tenderness: There is no abdominal tenderness.  Musculoskeletal:        General: No swelling or tenderness.  Skin:    Findings: Erythema and rash present.     Comments: She has a moist red rash under her right arm, both breasts and abdominal folds typical of candidal infection.  Psychiatric:     Comments:  Affect.     Lab Results    Problem List Items Addressed This Visit      High   MRSA bacteremia    I believe her bacteremia has been cured.      Enterococcal bacteremia    I believe her bacteremia has been cured.        Unprioritized   Candidal intertrigo    I recommended fluconazole 100 mg by mouth daily for 7 days in addition to topical nystatin powder.          Michel Bickers, MD [facility] [phone] pager   [phone] cell 09/25/2019, 1:41 PM

## 2019-09-30 ENCOUNTER — Ambulatory Visit (INDEPENDENT_AMBULATORY_CARE_PROVIDER_SITE_OTHER): Payer: Medicare Other | Admitting: Gastroenterology

## 2019-09-30 ENCOUNTER — Encounter: Payer: Self-pay | Admitting: Gastroenterology

## 2019-09-30 ENCOUNTER — Other Ambulatory Visit (INDEPENDENT_AMBULATORY_CARE_PROVIDER_SITE_OTHER): Payer: Medicare Other

## 2019-09-30 VITALS — BP 128/76 | HR 66

## 2019-09-30 DIAGNOSIS — R748 Abnormal levels of other serum enzymes: Secondary | ICD-10-CM | POA: Diagnosis not present

## 2019-09-30 DIAGNOSIS — D649 Anemia, unspecified: Secondary | ICD-10-CM

## 2019-09-30 LAB — CBC WITH DIFFERENTIAL/PLATELET
Basophils Absolute: 0 10*3/uL (ref 0.0–0.1)
Basophils Relative: 0.5 % (ref 0.0–3.0)
Eosinophils Absolute: 0.5 10*3/uL (ref 0.0–0.7)
Eosinophils Relative: 6.2 % — ABNORMAL HIGH (ref 0.0–5.0)
HCT: 43.6 % (ref 36.0–46.0)
Hemoglobin: 14.2 g/dL (ref 12.0–15.0)
Lymphocytes Relative: 26.4 % (ref 12.0–46.0)
Lymphs Abs: 2.1 10*3/uL (ref 0.7–4.0)
MCHC: 32.6 g/dL (ref 30.0–36.0)
MCV: 95.1 fl (ref 78.0–100.0)
Monocytes Absolute: 0.4 10*3/uL (ref 0.1–1.0)
Monocytes Relative: 5.2 % (ref 3.0–12.0)
Neutro Abs: 4.8 10*3/uL (ref 1.4–7.7)
Neutrophils Relative %: 61.7 % (ref 43.0–77.0)
Platelets: 148 10*3/uL — ABNORMAL LOW (ref 150.0–400.0)
RBC: 4.58 Mil/uL (ref 3.87–5.11)
RDW: 18.9 % — ABNORMAL HIGH (ref 11.5–15.5)
WBC: 7.8 10*3/uL (ref 4.0–10.5)

## 2019-09-30 LAB — IBC + FERRITIN
Ferritin: 603.7 ng/mL — ABNORMAL HIGH (ref 10.0–291.0)
Iron: 67 ug/dL (ref 42–145)
Saturation Ratios: 15.4 % — ABNORMAL LOW (ref 20.0–50.0)
Transferrin: 311 mg/dL (ref 212.0–360.0)

## 2019-09-30 NOTE — Patient Instructions (Signed)
If you are age 71 or older, your body mass index should be between 23-30. Your There is no height or weight on file to calculate BMI. If this is out of the aforementioned range listed, please consider follow up with your Primary Care Provider.  If you are age 59 or younger, your body mass index should be between 19-25. Your There is no height or weight on file to calculate BMI. If this is out of the aformentioned range listed, please consider follow up with your Primary Care Provider.   It has been recommended to you by your physician that you have a(n) EGD/Colon completed. Per your request, we did not schedule the procedure(s) today. Please contact our office at [phone] should you decide to have the procedure completed. You will be scheduled for a pre-visit and procedure at that time.  You have been scheduled for an abdominal ultrasound at [facility] (1st floor of hospital) on 10-09-2019 at 1030am. Please arrive 15 minutes prior to your appointment for registration. Make certain not to have anything to eat or drink 6 hours prior to your appointment. Should you need to reschedule your appointment, please contact radiology at [phone]. This test typically takes about 30 minutes to perform.  Your provider has requested that you go to the basement level for lab work before leaving today. Press "B" on the elevator. The lab is located at the first door on the left as you exit the elevator.  Due to recent changes in healthcare laws, you may see the results of your imaging and laboratory studies on MyChart before your provider has had a chance to review them.  We understand that in some cases there may be results that are confusing or concerning to you. Not all laboratory results come back in the same time frame and the provider may be waiting for multiple results in order to interpret others.  Please give [place] 48 hours in order for your provider to thoroughly review all the results before  contacting the office for clarification of your results.   It was a pleasure to see you today!  Dr. Tarri Glenn

## 2019-09-30 NOTE — Progress Notes (Signed)
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 Platelet count is normal.    Acute elevation of LFTs likely d/t DILI/infection while she was hospitalized. Neg serologic eval. No ETOH. LFTs are trending down at time of last check 7/31.  Repeat liver enzymes today. Will proceed with NASH FibroSURE and elastography for further staging. No strong indication for liver biopsy at this time.   Source of anemia may be multifactorial. No overt or occult bleeding. No noted iron deficiency. She declines colonoscopy. Would consider EGD for evaluation. Otherwise, prefers monitoring. Feels like she has been through enough this year.   PLAN: CBC, iron, ferritin Hepatic function panel Avoid hepatotoxic drugs EGD and colonoscopy at the hospital Fibrosure Elastography  Please see the "Patient Instructions" section for addition details about the plan.  I spent 40 minutes, including in depth chart review, independent review of results, communicating results with the patient directly, face-to-face time with the patient, coordinating care, ordering studies and medications as appropriate, and documentation.  HPI: Heather Petty is a 69 y.o. female who returns in hospital follow-up. She has diabetes mellitus type 2, hypertension, gout, asthma who was recently admitted in early July 2021 to the hospital for MRSA bacteremia and enterococcal bacteremia. She was hospitalized in late July 2021 for community acquired pneumonia.   Our team provided consultation for for abnormal liver enzymes during her hospitalization  08/08/19. Alk phos was persistently elevated (1.5-3.5 x normal) since it was first checked last year. Transaminases (AST=ALT)  and bilirubin have been intermittently, mildly elevated. GGT elevated at 190. All liver enzyme abnormalities were trending down during her most recent hospitalization without intervention.    Patient is adopted and family history is unknown. She denies risks factors for chronic viral hepatitis.    Serologic testing was negative for Covid, HCV, HBV, HIV. She had a positive HAV IgM antibody in 2019.  Celiac serologies were negative including TTGA.  BNP normal.  5" nucelotidase was elevated at 40. GGTP elevated at 190.  A1A elevated. ANA positive 1:80. AMA negative.  IgG, IgM, and IgA were normal.     CT abd/pelvis with contrast 6/20, CT abd/pelvis without contrast 6/21 and ultrasound 6/21 and 6/29 showed hepatic steatosis.  MRI/MRCP 08/08/19 showed fatty liver, trace ascites, mild splenomegaly, and bilateral pleural effusions.   Last available liver enzymes from 09/06/19 show persistent elevation, but, the trend was overall improving with TB 1.4, AST 36, ALT 79, alk phos 493.  She has had concurrent anemia requiring PRBCs. There is no evidence for overt or occult bleeding. FOBT negative 08/05/19.  However, she has not had any colon cancer screening. Recommended colonoscopy as an outpatient.   Last labs show a hemoglobin of 7/9 09/08/19 with an MCV 95, RDW 17.  08/06/19 her iron was 63, ferritin was 1095.   No ongoing cough or shortness of breath. GI ROS is negative. No melena, hematochezia, bright red blood per rectum. No epistaxis, vaginal bleeding, hemoptysis, or hematuria.   No identified exacerbating or relieving features.  Adopted. Family history is unknown. She reports some kind of endoscopy over 20 years ago.  Past Medical History:  Diagnosis Date  . AKI (acute kidney injury) ([place]) 12/17/2017  . Arthritis   . Asthma   . Depression   . Diabetes mellitus   . Diabetes  mellitus without complication ([place])   . Gout   . High cholesterol   . Hypertension   . Morbid obesity ([place])   . Pneumonia 12/17/2017  . Schizophrenia ([place])   . Stroke [facility])     Past Surgical History:  Procedure Laterality Date  . CYST EXCISION    . DENTAL SURGERY    . TONSILLECTOMY      Current Outpatient Medications  Medication Sig Dispense Refill  . acetaminophen (TYLENOL) 325 MG tablet Take 2 tablets (650 mg total) by mouth every 6 (six) hours as needed for moderate pain or fever (headache).    Marland Kitchen albuterol (VENTOLIN HFA) 108 (90 Base) MCG/ACT inhaler Inhale 2 puffs into the lungs every 4 (four) hours as needed for wheezing or shortness of breath.    Marland Kitchen albuterol (VENTOLIN HFA) 108 (90 Base) MCG/ACT inhaler Inhale 1-2 puffs into the lungs every 4 (four) hours as needed for wheezing or shortness of breath.    . allopurinol (ZYLOPRIM) 300 MG tablet Take 300 mg by mouth daily.    Marland Kitchen allopurinol (ZYLOPRIM) 300 MG tablet Take 300 mg by mouth daily.    Marland Kitchen amLODipine (NORVASC) 10 MG tablet Take 10 mg by mouth daily.    Marland Kitchen amLODipine (NORVASC) 10 MG tablet Take 10 mg by mouth daily.    Marland Kitchen ascorbic acid (VITAMIN C) 500 MG tablet Take 500 mg by mouth daily.    . Azelastine-Fluticasone (DYMISTA) 137-50 MCG/ACT SUSP Place 1 spray into both nostrils 2 (two) times daily as needed (for allergies).     . Azelastine-Fluticasone (DYMISTA) 137-50 MCG/ACT SUSP Place 1 spray into both nostrils 2 (two) times daily as needed (for allergies).    Marland Kitchen b complex vitamins tablet Take 1 tablet by mouth daily.    Marland Kitchen b complex vitamins tablet Take 1 tablet by mouth daily.    . bisacodyl (DULCOLAX) 5 MG EC tablet Take 1 tablet (5 mg total) by mouth daily as needed for moderate constipation. 30 tablet 0  . bisacodyl (DULCOLAX) 5 MG EC tablet Take 5 mg by mouth daily as needed for moderate constipation.    . collagenase (SANTYL) ointment Apply topically daily. (Patient not taking: Reported on 08/05/2019) 15 g 0  .  Colloidal Oatmeal (EUCERIN ECZEMA RELIEF) 1 % CREA Apply 1 application topically 2 (two) times daily.    . diphenhydrAMINE (BENADRYL) 2 % cream Apply 1 application topically 3 (three) times daily as needed for itching.    . diphenhydrAMINE (BENADRYL) 2 % cream Apply topically 3 (three) times daily as needed for itching.    Marland Kitchen doxepin (SINEQUAN) 25 MG capsule Take 25 mg by mouth at bedtime.     Marland Kitchen doxepin (SINEQUAN) 25 MG capsule Take 25 mg by mouth at bedtime.    . Emollient (CERAVE) CREA Apply 1 application topically in the morning and at bedtime. To feet, legs, trunk, arms and hands    . famotidine (PEPCID) 20 MG tablet Take 20 mg by mouth 2 (two) times daily.    . famotidine (PEPCID) 20 MG tablet Take 20 mg by mouth 2 (two) times daily.    . feeding supplement, GLUCERNA SHAKE, (GLUCERNA SHAKE) LIQD Take 237 mLs by mouth daily.    . folic acid (FOLVITE) 1 MG tablet Take 1 mg by mouth daily.    Marland Kitchen  folic acid (FOLVITE) 1 MG tablet Take 1 mg by mouth daily.    Marland Kitchen glipiZIDE (GLUCOTROL XL) 10 MG 24 hr tablet Take 10 mg by mouth 2 (two) times daily.     Marland Kitchen glipiZIDE (GLUCOTROL) 10 MG tablet Take 10 mg by mouth 2 (two) times daily before a meal.    . hydrocortisone 2.5 % cream Apply 1 application topically See admin instructions. Apply topically to rash on face twice daily    . hydrocortisone 2.5 % cream Apply 1 application topically 2 (two) times daily. To face rash    . hydrOXYzine (ATARAX/VISTARIL) 25 MG tablet Take 25 mg by mouth 2 (two) times daily.     . hydrOXYzine (ATARAX/VISTARIL) 25 MG tablet Take 25 mg by mouth in the morning and at bedtime.    . insulin aspart (NOVOLOG) 100 UNIT/ML injection Inject 0-12 Units into the skin See admin instructions. Inject 0-12 units into the skin 4 times a day (before meals and at bedtime) per sliding scale: BGL <60 or >400  = CALL MD; 60-200 = 0 units; 201-250 = 2 units; 251-300 = 4 units; 301-350 = 6 units; 351-400 = 8 units; 401-450 = 10 units; >450 = 12 units      . insulin aspart (NOVOLOG) 100 UNIT/ML injection Inject 0-12 Units into the skin 4 (four) times daily -  with meals and at bedtime. If blood sugar is less than 60= call doctor 60-200= 0 units 201-250= 2 units 251-300= 4 units 301-350= 6 units 351-400= 8 units 401-450= 10 units If greater than 450= give 12 units then after administering 12 units check CBG in 2 hours, if over 400 contact MD.    . insulin glargine (LANTUS) 100 UNIT/ML injection Inject 0.25 mLs (25 Units total) into the skin at bedtime.    . insulin glargine (LANTUS) 100 UNIT/ML injection Inject 0.1 mLs (10 Units total) into the skin at bedtime.    Marland Kitchen ipratropium-albuterol (DUONEB) 0.5-2.5 (3) MG/3ML SOLN Take 3 mLs by nebulization every 6 (six) hours as needed (for shortness of breath or wheezing).     Marland Kitchen ipratropium-albuterol (DUONEB) 0.5-2.5 (3) MG/3ML SOLN Take 3 mLs by nebulization every 6 (six) hours as needed (for wheezing).    Marland Kitchen lidocaine (LIDODERM) 5 % Place 1 patch onto the skin daily. Remove & Discard patch within 12 hours or as directed by MD    . loperamide (IMODIUM) 2 MG capsule Take 1 capsule (2 mg total) by mouth as needed for diarrhea or loose stools. 30 capsule 0  . Menthol, Topical Analgesic, (BIOFREEZE) 4 % GEL Apply 1 application topically daily. To right and left knees    . Multiple Vitamin (MULTIVITAMIN WITH MINERALS) TABS tablet Take 1 tablet by mouth daily.    . Multiple Vitamins-Minerals (THERA-M MULTIPLE VITAMINS PO) Take 1 tablet by mouth daily.    . nadolol (CORGARD) 20 MG tablet Take 1 tablet (20 mg total) by mouth daily. 30 tablet 2  . nadolol (CORGARD) 20 MG tablet Take 20 mg by mouth daily.    Marland Kitchen nystatin (MYCOSTATIN/NYSTOP) powder Apply 1 g topically See admin instructions. Apply topically twice daily as needed for wound care  -  between folds, beneath breasts, bilateral axillae, and under pannus    . nystatin (MYCOSTATIN/NYSTOP) powder Apply 1 application topically 2 (two) times daily. Between folds,  beneath breast, bilataxillae, under pannus    . Polyethyl Glycol-Propyl Glycol (SYSTANE ULTRA) 0.4-0.3 % SOLN Place 2 drops into both eyes in the morning and  at bedtime.    . polyethylene glycol (MIRALAX / GLYCOLAX) 17 g packet Take 17 g by mouth daily as needed for mild constipation.    . polyethylene glycol (MIRALAX / GLYCOLAX) 17 g packet Take 17 g by mouth daily as needed for mild constipation.    . risperiDONE (RISPERDAL) 0.5 MG tablet Take 1 tablet (0.5 mg total) by mouth 2 (two) times daily. 60 tablet 0  . risperiDONE (RISPERDAL) 0.5 MG tablet Take 0.5 mg by mouth 2 (two) times daily.    . Skin Protectants, Misc. (EUCERIN) cream Apply 1 application topically in the morning and at bedtime.    . vitamin C (ASCORBIC ACID) 500 MG tablet Take 500 mg by mouth 2 (two) times daily.    . Vitamin D, Ergocalciferol, (DRISDOL) 1.25 MG (50000 UT) CAPS capsule Take 50,000 Units by mouth every Monday.     . Zinc Sulfate (ZINC-220 PO) Take 220 mg by mouth at bedtime.     Marland Kitchen zinc sulfate 220 (50 Zn) MG capsule Take 220 mg by mouth at bedtime.     No current facility-administered medications for this visit.    Allergies as of 09/30/2019 - Review Complete 09/03/2019  Allergen Reaction Noted  . Vancomycin Other (See Comments) 09/05/2019  . Lasix [furosemide] Other (See Comments) 05/07/2018  . Lasix [furosemide] Other (See Comments) 09/03/2019    Family History  Family history unknown: Yes    Social History   Socioeconomic History  . Marital status: Unknown    Spouse name: Not on file  . Number of children: Not on file  . Years of education: Not on file  . Highest education level: Not on file  Occupational History  . Not on file  Tobacco Use  . Smoking status: Never Smoker  . Smokeless tobacco: Never Used  Vaping Use  . Vaping Use: Never used  Substance and Sexual Activity  . Alcohol use: Never  . Drug use: Never  . Sexual activity: Not on file  Other Topics Concern  . Not on file    Social History Narrative   ** Merged History Encounter **       Social Determinants of Health   Financial Resource Strain:   . Difficulty of Paying Living Expenses: Not on file  Food Insecurity:   . Worried About Charity fundraiser in the Last Year: Not on file  . Ran Out of Food in the Last Year: Not on file  Transportation Needs:   . Lack of Transportation (Medical): Not on file  . Lack of Transportation (Non-Medical): Not on file  Physical Activity:   . Days of Exercise per Week: Not on file  . Minutes of Exercise per Session: Not on file  Stress:   . Feeling of Stress : Not on file  Social Connections:   . Frequency of Communication with Friends and Family: Not on file  . Frequency of Social Gatherings with Friends and Family: Not on file  . Attends Religious Services: Not on file  . Active Member of Clubs or Organizations: Not on file  . Attends Archivist Meetings: Not on file  . Marital Status: Not on file  Intimate Partner Violence:   . Fear of Current or Ex-Partner: Not on file  . Emotionally Abused: Not on file  . Physically Abused: Not on file  . Sexually Abused: Not on file    Physical Exam: General:   Alert,  well-nourished, pleasant and cooperative in NAD. Examined in a  wheelchair.  Head:  Normocephalic and atraumatic. Eyes:  Sclera clear, no icterus.   Conjunctiva pink. Ears:  Normal auditory acuity. Nose:  No deformity, discharge,  or lesions. Mouth:  No deformity or lesions.   Neck:  Supple; no masses or thyromegaly. Lungs:  Clear throughout to auscultation.   No wheezes. Heart:  Regular rate and rhythm; no murmurs. Abdomen:  Soft, central obesity, nondistended, normal bowel sounds, no rebound or guarding. No hepatosplenomegaly.   Rectal:  Deferred  Msk:  Symmetrical. No boney deformities LAD: No inguinal or umbilical LAD Extremities:  No clubbing or edema. Neurologic:  Alert and  oriented x4;  grossly nonfocal Skin:  Intact without  significant lesions or rashes. Psych:  Alert and cooperative. Normal mood and affect.     Davin Archuletta L. Tarri Glenn, MD, MPH 09/30/2019, 10:58 AM

## 2019-10-06 ENCOUNTER — Telehealth: Payer: Self-pay

## 2019-10-06 NOTE — Telephone Encounter (Signed)
Thanks for the update. Please schedule the procedures if the patient wishes. If not, please schedule a follow-up appointment with an APP - next non-urgent available appointment. Thank you.

## 2019-10-06 NOTE — Telephone Encounter (Signed)
Patient has declined the EGD/colon. Follow up scheduled with P.Guenther NP for 11-13-2019 @ 130. Notification of appointment has been mailed to the [facility] as no one answers the phone at the facility to request a fax number.

## 2019-10-06 NOTE — Telephone Encounter (Signed)
Left a message to return call. RE: EGD/Colon. Patient at the visit on 09-30-19 declined to schedule due to lack of transportation and no care partner.  I left a message with the patients daughter Margreta Journey.

## 2019-10-06 NOTE — Telephone Encounter (Signed)
Heather Petty the daughter called and states that her mother is a difficult patient on multiple levels. She has a history of  mental health complications to make things more of a challenge, she has refused to follow up with any medical providers. She is convinced that everyone is out to harm her or even kill her. She is currently staying at [facility]. They would be able to provide transportation to and from a scheduled procedure, however she will not be sedated for any purpose due the fear of someone harming her. Heather Petty is working with a Education officer, museum to get her in to a permanent living assisted home. Unfortunately, this process is long and hasn't made much progress. Heather Petty lives alone and if she gets to go home she will not have any medical care per her wishes, she will not give her daughter POA rights. Her daughter fears if she goes home nothing will change. Heather Petty states her at home living conditions are extremely bad, she describes her mothers home as a hoarders house, dirty and roach infested. Heather Petty said she will be available for any additional information she can provide. She is also willing to come to any procedures her mother needs a care partner for, she does have some restrictions due to having 4 children, but with enough notice can make arrangements.

## 2019-10-09 ENCOUNTER — Ambulatory Visit (HOSPITAL_COMMUNITY)
Admission: RE | Admit: 2019-10-09 | Discharge: 2019-10-09 | Disposition: A | Discharge status: Home / Self Care | Payer: Medicare Other | Source: Ambulatory Visit | Attending: Gastroenterology | Admitting: Gastroenterology

## 2019-10-09 DIAGNOSIS — R748 Abnormal levels of other serum enzymes: Secondary | ICD-10-CM

## 2019-10-09 DIAGNOSIS — D649 Anemia, unspecified: Secondary | ICD-10-CM

## 2019-10-29 ENCOUNTER — Other Ambulatory Visit (HOSPITAL_COMMUNITY): Payer: Medicare Other

## 2019-11-06 ENCOUNTER — Ambulatory Visit (HOSPITAL_COMMUNITY)
Admission: RE | Admit: 2019-11-06 | Discharge: 2019-11-06 | Disposition: A | Discharge status: Home / Self Care | Payer: Medicare Other | Source: Ambulatory Visit | Attending: Gastroenterology | Admitting: Gastroenterology

## 2019-11-06 ENCOUNTER — Other Ambulatory Visit: Payer: Self-pay

## 2019-11-06 DIAGNOSIS — R748 Abnormal levels of other serum enzymes: Secondary | ICD-10-CM | POA: Diagnosis present

## 2019-11-06 DIAGNOSIS — D649 Anemia, unspecified: Secondary | ICD-10-CM | POA: Insufficient documentation

## 2019-11-13 ENCOUNTER — Ambulatory Visit: Payer: Medicare Other | Admitting: Nurse Practitioner

## 2019-11-14 ENCOUNTER — Ambulatory Visit: Payer: Medicare Other | Admitting: Gastroenterology

## 2019-12-30 ENCOUNTER — Ambulatory Visit: Payer: Medicare Other | Admitting: Nurse Practitioner

## 2020-06-30 ENCOUNTER — Ambulatory Visit: Payer: Self-pay | Admitting: Orthopaedic Surgery

## 2020-07-07 ENCOUNTER — Ambulatory Visit (INDEPENDENT_AMBULATORY_CARE_PROVIDER_SITE_OTHER): Payer: Medicare Other

## 2020-07-07 ENCOUNTER — Ambulatory Visit (INDEPENDENT_AMBULATORY_CARE_PROVIDER_SITE_OTHER): Payer: Medicare Other | Admitting: Orthopaedic Surgery

## 2020-07-07 ENCOUNTER — Telehealth: Payer: Self-pay

## 2020-07-07 ENCOUNTER — Encounter: Payer: Self-pay | Admitting: Orthopaedic Surgery

## 2020-07-07 ENCOUNTER — Ambulatory Visit: Payer: Self-pay

## 2020-07-07 VITALS — Ht 60.0 in | Wt 224.0 lb

## 2020-07-07 DIAGNOSIS — G8929 Other chronic pain: Secondary | ICD-10-CM

## 2020-07-07 DIAGNOSIS — M25561 Pain in right knee: Secondary | ICD-10-CM | POA: Diagnosis not present

## 2020-07-07 DIAGNOSIS — M1712 Unilateral primary osteoarthritis, left knee: Secondary | ICD-10-CM | POA: Diagnosis not present

## 2020-07-07 DIAGNOSIS — M25562 Pain in left knee: Secondary | ICD-10-CM | POA: Diagnosis not present

## 2020-07-07 DIAGNOSIS — M1711 Unilateral primary osteoarthritis, right knee: Secondary | ICD-10-CM | POA: Diagnosis not present

## 2020-07-07 DIAGNOSIS — Z6841 Body Mass Index (BMI) 40.0 and over, adult: Secondary | ICD-10-CM

## 2020-07-07 NOTE — Telephone Encounter (Signed)
Please precert for bilateral visco injections. This is Dr.Xu's patient. Thanks!

## 2020-07-07 NOTE — Telephone Encounter (Signed)
Noted  

## 2020-07-07 NOTE — Progress Notes (Signed)
Office Visit Note   Patient: Heather Petty           Date of Birth: 1950/07/30           MRN: [id number] Visit Date: 07/07/2020              Requested by: No referring provider defined for this encounter. PCP: Pcp, No   Assessment & Plan: Visit Diagnoses:  1. Primary osteoarthritis of right knee   2. Primary osteoarthritis of left knee   3. Body mass index 40.0-44.9, adult ([place])   4. Morbid obesity ([place])     Plan: Impression is advanced tricompartment degenerative joint disease.  With her poorly controlled diabetes I am hesitant to administer cortisone injections today.  I reviewed flowsheet from the facility and her sugars ranged from the 200s to the 400s.  We would need to get an A1c from the facility before we can determine if cortisone injections are appropriate or even safe.  We will try to get approval for Visco injections for her.  We talked about the importance of significant weight loss and physical therapy to help strengthen her knees.  Hopefully we can get Visco approved for her.  Follow-Up Instructions: Return if symptoms worsen or fail to improve.   Orders:  Orders Placed This Encounter  Procedures  . XR KNEE 3 VIEW LEFT  . XR KNEE 3 VIEW RIGHT   No orders of the defined types were placed in this encounter.     Procedures: No procedures performed   Clinical Data: No additional findings.   Subjective: Chief Complaint  Patient presents with  . Right Knee - Pain  . Left Knee - Pain    Heather Petty is a 70 year old female who comes in for evaluation of chronic bilateral knee pain for years.  She has been living at [facility] for the last year after she was found down at home by her daughter.  She has been unable to become independent enough to leave action in place.  Locking and giving way.  She has occasional swelling.  She is a poorly controlled diabetic with hypertension and cardiovascular disease.  Ambulates minimal distances.   Review of Systems   Constitutional: Negative.   HENT: Negative.   Eyes: Negative.   Respiratory: Negative.   Cardiovascular: Negative.   Endocrine: Negative.   Musculoskeletal: Negative.   Neurological: Negative.   Hematological: Negative.   Psychiatric/Behavioral: Negative.   All other systems reviewed and are negative.    Objective: Vital Signs: Ht 5' (1.524 m)   Wt 224 lb (101.6 kg)   BMI 43.75 kg/m   Physical Exam Vitals and nursing note reviewed.  Constitutional:      Appearance: She is well-developed.  HENT:     Head: Normocephalic and atraumatic.  Pulmonary:     Effort: Pulmonary effort is normal.  Abdominal:     Palpations: Abdomen is soft.  Musculoskeletal:     Cervical back: Neck supple.  Skin:    General: Skin is warm.     Capillary Refill: Capillary refill takes less than 2 seconds.  Neurological:     Mental Status: She is alert and oriented to person, place, and time.  Psychiatric:        Behavior: Behavior normal.        Thought Content: Thought content normal.        Judgment: Judgment normal.     Ortho Exam Bilateral knees show no joint effusion.  Pain and patellofemoral crepitus with  range of motion.  Collaterals and cruciates are grossly intact. Specialty Comments:  No specialty comments available.  Imaging: XR KNEE 3 VIEW LEFT  Result Date: 07/07/2020 Advanced tricompartmental degenerative joint disease  XR KNEE 3 VIEW RIGHT  Result Date: 07/07/2020 Advanced tricompartment degenerative joint disease    PMFS History: Patient Active Problem List   Diagnosis Date Noted  . Candidal intertrigo 09/25/2019  . CAP (community acquired pneumonia) 09/03/2019  . Uncontrolled type 2 diabetes mellitus with hyperglycemia ([place]) 09/03/2019  . Elevated LFTs 09/03/2019  . Essential hypertension 09/03/2019  . Gout 09/03/2019  . Asthma 09/03/2019  . Anemia 09/03/2019  . Pneumonia 09/03/2019  . Splenomegaly   . Anemia 08/05/2019  . Diarrhea 08/05/2019  . DKA  (diabetic ketoacidoses) 07/22/2019  . Rhabdomyolysis 07/22/2019  . Acute metabolic encephalopathy 29/92/4268  . MRSA bacteremia 07/22/2019  . Enterococcal bacteremia 07/22/2019  . Vitiligo 07/22/2019  . 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 Sepsis ([place]) 07/19/2018  . Ventricular tachycardia, sustained ([place]) 07/19/2018  . NSTEMI (non-ST elevated myocardial infarction) ([place]) 07/19/2018  . Wound infection 04/03/2018  . Morbid obesity with BMI of 50.0-59.9, adult ([place]) 04/03/2018  . Elevated alkaline phosphatase level 04/03/2018  . Asthma exacerbation 03/14/2018  . Hypotension 03/14/2018  . Lactic acidosis 03/14/2018  . GERD (gastroesophageal reflux disease) 03/14/2018  . Acute renal failure superimposed on stage 3 chronic kidney disease ([place]) 03/14/2018  . Macrocytic anemia 03/14/2018  . Abnormal LFTs 03/14/2018  . Acute kidney injury ([place]) 02/28/2018  . Wound of sacral region 02/28/2018  . Hypothermia 02/28/2018  . Stage II pressure ulcer of sacral region ([place]) 02/15/2018  . DM II (diabetes mellitus, type II), controlled ([place]) 02/09/2018  . Schizophrenia ([place]) 02/09/2018  . Pressure ulcer 02/06/2018  . Acute renal failure (ARF) ([place]) 02/05/2018  . Hyperlipidemia associated with type 2 diabetes mellitus ([place]) 02/05/2018  . Hyperkalemia 02/05/2018  . Dermatitis 02/05/2018  . Open back wound 02/05/2018  . Dysuria 02/05/2018  . Paranoid schizophrenia ([place])   . AKI (acute kidney injury) ([place]) 12/17/2017  . Type II diabetes mellitus with renal manifestations ([place]) 12/17/2017  . HTN (hypertension) 12/17/2017  . Gout    Past Medical History:  Diagnosis Date  . AKI (acute kidney injury) ([place]) 12/17/2017  . Anemia   . Arthritis   . Asthma   . Depression   . Diabetes mellitus   . Diabetes mellitus without complication ([place])   . GERD (gastroesophageal reflux disease)   . Gout   . High cholesterol   . Hypertension   . Insomnia   . Morbid obesity ([place])   . NASH (nonalcoholic  steatohepatitis)   . Pneumonia 12/17/2017  . Schizophrenia ([place])   . Stroke ([place])   . Vitamin B deficiency   . Vitamin D deficiency     Family History  Adopted: Yes  Family history unknown: Yes    Past Surgical History:  Procedure Laterality Date  . CYST EXCISION    . DENTAL SURGERY    . TONSILLECTOMY     Social History   Occupational History  . Not on file  Tobacco Use  . Smoking status: Never Smoker  . Smokeless tobacco: Never Used  Vaping Use  . Vaping Use: Never used  Substance and Sexual Activity  . Alcohol use: Never  . Drug use: Never  . Sexual activity: Not on file

## 2020-07-30 ENCOUNTER — Telehealth: Payer: Self-pay

## 2020-07-30 NOTE — Telephone Encounter (Signed)
VOB submitted for Monovisc, bilateral knee. Pending BV.

## 2020-08-02 ENCOUNTER — Telehealth: Payer: Self-pay

## 2020-08-02 NOTE — Telephone Encounter (Signed)
Called and left a Vm advising patient to CB to schedule for gel injection with Dr. Erlinda Hong  Approved for Monovisc, bilateral knee. [place] deductible has been met Patient will be responsible for 20% OOP. No Co-pay No PA required

## 2020-09-27 ENCOUNTER — Telehealth: Payer: Self-pay

## 2020-09-27 NOTE — Telephone Encounter (Signed)
Pt now a resident at [facility]. Spoke with Nurse there regarding the need for labs and a follow up appt. for pt.   Order for Labs entered in to [place].  Pt scheduled for a follow up appt with Dr Tarri Glenn 11/05/20 @ 11:10

## 2020-10-01 ENCOUNTER — Other Ambulatory Visit: Payer: Self-pay

## 2020-10-01 DIAGNOSIS — R748 Abnormal levels of other serum enzymes: Secondary | ICD-10-CM

## 2020-10-08 ENCOUNTER — Other Ambulatory Visit: Payer: Medicare Other

## 2020-10-08 DIAGNOSIS — R748 Abnormal levels of other serum enzymes: Secondary | ICD-10-CM

## 2020-10-18 LAB — NASH FIBROSURE
ALPHA 2-MACROGLOBULINS, QN: 245 mg/dL (ref 110–276)
ALT (SGPT) P5P: 21 IU/L (ref 0–40)
AST (SGOT) P5P: 29 IU/L (ref 0–40)
Apolipoprotein A-1: 185 mg/dL (ref 116–209)
Bilirubin, Total: <0.1 mg/dL (ref 0.0–1.2)
Cholesterol, Total: 311 mg/dL — ABNORMAL HIGH (ref 100–199)
Fibrosis Score: 0.14 (ref 0.00–0.21)
GGT: 105 IU/L — ABNORMAL HIGH (ref 0–60)
Glucose: 232 mg/dL — ABNORMAL HIGH (ref 65–99)
Haptoglobin: 176 mg/dL (ref 42–346)
Height: 60 in
NASH Score: 0.75 — ABNORMAL HIGH
Steatosis Score: 0.84 — ABNORMAL HIGH (ref 0.00–0.30)
Triglycerides: 254 mg/dL — ABNORMAL HIGH (ref 0–149)
Weight: 224 [lb_av]

## 2020-11-05 ENCOUNTER — Ambulatory Visit (INDEPENDENT_AMBULATORY_CARE_PROVIDER_SITE_OTHER): Payer: Medicare Other | Admitting: Gastroenterology

## 2020-11-05 ENCOUNTER — Encounter: Payer: Self-pay | Admitting: Gastroenterology

## 2020-11-05 VITALS — BP 150/88 | HR 76 | Ht 60.0 in | Wt 222.2 lb

## 2020-11-05 DIAGNOSIS — R197 Diarrhea, unspecified: Secondary | ICD-10-CM

## 2020-11-05 DIAGNOSIS — R109 Unspecified abdominal pain: Secondary | ICD-10-CM | POA: Diagnosis not present

## 2020-11-05 DIAGNOSIS — R748 Abnormal levels of other serum enzymes: Secondary | ICD-10-CM

## 2020-11-05 DIAGNOSIS — D649 Anemia, unspecified: Secondary | ICD-10-CM | POA: Diagnosis not present

## 2020-11-05 NOTE — Patient Instructions (Signed)
If you are age 72 or older, your body mass index should be between 23-30. Your Body mass index is 43.41 kg/m. If this is out of the aforementioned range listed, please consider follow up with your Primary Care Provider.  If you are age 22 or younger, your body mass index should be between 19-25. Your Body mass index is 43.41 kg/m. If this is out of the aformentioned range listed, please consider follow up with your Primary Care Provider.   __________________________________________________________  The [facility] providers would like to encourage you to use [facility] to communicate with providers for non-urgent requests or questions.  Due to long hold times on the telephone, sending your provider a message by [facility] may be a faster and more efficient way to get a response.  Please allow 48 business hours for a response.  Please remember that this is for non-urgent requests.   We will be in touch with you soon about scheduling you a colonoscopy/Endo at the hospital.  It was a pleasure to see you today!  Thank you for trusting me with your gastrointestinal care!

## 2020-11-05 NOTE — Progress Notes (Signed)
Referring Provider: No ref. 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 Platelet count is normal.  No fibrosis seen on elastography or FibroSURE. Continue close monitoring for evidence of disease progress.    Anemia without overt bleeding noted during hospitalization last year.  Source of anemia may be multifactorial. No overt or occult bleeding. No noted iron deficiency. Will obtain recent labs to follow-up. If not followed closely at [facility], will repeat. EGD and colonoscopy recommended to evaluate the anemia and her recent GI symptoms.   Recent GI symptoms. Etiology unclear. May be related to [place]. Avoid Miralax given recent diarrhea.  PLAN: - Obtain recent labs from [facility] to follow-up on both anemia and abnormal liver enzymes (patient reports recent labs performed) - Continue famotidine, consider addition of pantoprazole - Change Miralax to PRN dosing to minimize diarrhea - Avoid hepatotoxic drugs - EGD and colonoscopy at the hospital   HPI: Heather Petty is a 70 y.o. female who was last seen in 2021.  Returns today in follow-up. She has diabetes mellitus type 2, hypertension, gout, asthma.  Initially seen for abnormal liver enzymes during her hospitalization 08/08/19 for community acquired pneumonia. Alk phos, transaminases, and bilirubin were noted to be intermittently elevated over the year prior to hospitalization and were trending down during her hospitalization without intervention.   She reports follow-up labs have been done multiple times. I do not see them in Epic or  CareEverywhere. She is unsure of they remain elevated.   Serologic testing was negative for Covid, HCV, HBV, HIV. She had a positive HAV IgM antibody in 2019.  Celiac serologies were negative including TTGA.  BNP normal.  5" nucelotidase was elevated at 40. GGTP elevated at 190.  A1A elevated. ANA positive 1:80. AMA negative.  IgG, IgM, and IgA were normal.      CT abd/pelvis with contrast 6/20, CT abd/pelvis without contrast 6/21 and ultrasound 6/21 and 6/29 showed hepatic steatosis.  MRI/MRCP 08/08/19 showed fatty liver, trace ascites, mild splenomegaly, and bilateral pleural effusions.   Abdominal ultrasound with elastography 11/06/19: echogenic liver, fatty appearing pancreas, median kPa 4.1 (high probability of being normal)  Labs 10/08/20 NASH FibroSURE: no fibrosis, marked or severe steatosis, N2 NASH  She had concurrent anemia requiring PRBCs while hospitalized in 2021. There was no overt or occult bleeding. FOBT negative 08/05/19.  However, she has not had any colon cancer screening. Recommended colonoscopy as an outpatient. She was fearful to proceed with the procedure and this has not yet been performed.  No recent anemia follow-up labs available to me today, although she is sure they have been checked many times. Last hemoglobin of 7/9 09/08/19 with an MCV 95, RDW 17.  08/06/19 her iron was 63, ferritin was 1095.   Primary complaints today are loose stools occurring 2-4 times daily, upper abdominal pain not related to defecation or eating, and some recent nausea.   Past Medical History:  Diagnosis Date   AKI (acute kidney injury) ([place]) 12/17/2017   Allergic rhinitis    Anemia    Arthritis    Asthma    Autonomic neuropathy  Depression    Diabetes mellitus    Diabetes mellitus without complication (HCC)    E coli infection    GERD (gastroesophageal reflux disease)    GERD (gastroesophageal reflux disease)    Gout    Hepatitis A    High cholesterol    Hypertension    Insomnia     Kidney failure    Metabolic encephalopathy    Morbid obesity (HCC)    NASH (nonalcoholic steatohepatitis)    Pneumonia 12/17/2017   Schizophrenia ([place])    Stroke ([place])    Vitamin B deficiency    Vitamin D deficiency     Past Surgical History:  Procedure Laterality Date   CYST EXCISION     buttucks   DENTAL SURGERY     TONSILLECTOMY      Current Outpatient Medications  Medication Sig Dispense Refill   albuterol (VENTOLIN HFA) 108 (90 Base) MCG/ACT inhaler Inhale 2 puffs into the lungs every 4 (four) hours as needed for wheezing or shortness of breath.     allopurinol (ZYLOPRIM) 300 MG tablet Take 300 mg by mouth daily.     amLODipine (NORVASC) 10 MG tablet Take 10 mg by mouth daily.     ammonium lactate (LAC-HYDRIN) 12 % lotion Apply 1 application topically daily as needed for dry skin.     carvedilol (COREG) 6.25 MG tablet Take 6.25 mg by mouth 2 (two) times daily with a meal.     Dextromethorphan-guaiFENesin 5-100 MG/5ML LIQD Take 10 mLs by mouth every 6 (six) hours as needed.     doxepin (SINEQUAN) 25 MG capsule Take 25 mg by mouth at bedtime.     famotidine (PEPCID) 20 MG tablet Take 20 mg by mouth 2 (two) times daily.     glipiZIDE (GLUCOTROL XL) 10 MG 24 hr tablet Take 10 mg by mouth 2 (two) times daily.      HYDROcodone-acetaminophen (NORCO/VICODIN) 5-325 MG tablet Take 1 tablet by mouth 2 (two) times daily.     hydrOXYzine (ATARAX/VISTARIL) 25 MG tablet Take 25 mg by mouth in the morning and at bedtime.     insulin aspart (NOVOLOG) 100 UNIT/ML injection Inject 0-12 Units into the skin See admin instructions. Inject 0-12 units into the skin 4 times a day (before meals and at bedtime) per sliding scale: BGL <60 or >400  = CALL MD; 60-200 = 0 units; 201-250 = 2 units; 251-300 = 4 units; 301-350 = 6 units; 351-400 = 8 units; 401-450 = 10 units; >450 = 12 units     insulin glargine (LANTUS) 100 UNIT/ML injection Inject 0.1 mLs (10 Units total) into the skin at bedtime. (Patient  taking differently: Inject 22 Units into the skin at bedtime.)     lidocaine (LIDODERM) 5 % Place 1 patch onto the skin daily. Remove & Discard patch within 12 hours or as directed by MD     lisinopril (ZESTRIL) 5 MG tablet Take 5 mg by mouth daily.     loperamide (IMODIUM) 2 MG capsule Take 1 capsule (2 mg total) by mouth as needed for diarrhea or loose stools. 30 capsule 0   Polyethyl Glycol-Propyl Glycol (SYSTANE ULTRA) 0.4-0.3 % SOLN Place 2 drops into both eyes in the morning and at bedtime.     polyethylene glycol (MIRALAX / GLYCOLAX) 17 g packet Take 17 g by mouth daily as needed for mild constipation.     risperiDONE (RISPERDAL) 0.5 MG tablet Take 0.5 mg by mouth daily.     Semaglutide,0.25 or  0.5MG/DOS, (OZEMPIC, 0.25 OR 0.5 MG/DOSE,) 2 MG/1.5ML SOPN Inject 0.5 mg into the skin once a week. On Sunday     triamcinolone (NASACORT ALLERGY 24HR) 55 MCG/ACT AERO nasal inhaler Place 1 spray into the nose daily.     No current facility-administered medications for this visit.    Allergies as of 11/05/2020 - Review Complete 11/05/2020  Allergen Reaction Noted   Vancomycin Other (See Comments) 09/05/2019   Lasix [furosemide] Other (See Comments) 05/07/2018   Lasix [furosemide] Other (See Comments) 09/03/2019    Family History  Adopted: Yes  Problem Relation Age of Onset   COPD Daughter      Physical Exam: General:   Alert,  well-nourished, pleasant and cooperative in NAD. Examined in a wheelchair.  Head:  Normocephalic and atraumatic. Eyes:  Sclera clear, no icterus.   Conjunctiva pink. Abdomen:  Soft, central obesity, nondistended, normal bowel sounds, no rebound or guarding. No hepatosplenomegaly.   Neurologic:  Alert and  oriented x4;  grossly nonfocal Skin:  Intact without significant lesions or rashes. Psych:  Alert and cooperative. Normal mood and affect.     Heather Lariviere L. Tarri Glenn, MD, MPH 11/05/2020, 12:11 PM

## 2020-12-12 IMAGING — DX DG CHEST 2V
2 series · 2 of 2 positions shown · non-contrast
Comparison: 08/10/2019

CLINICAL DATA: Fever

EXAM:
CHEST - 2 VIEW

[chest lat]
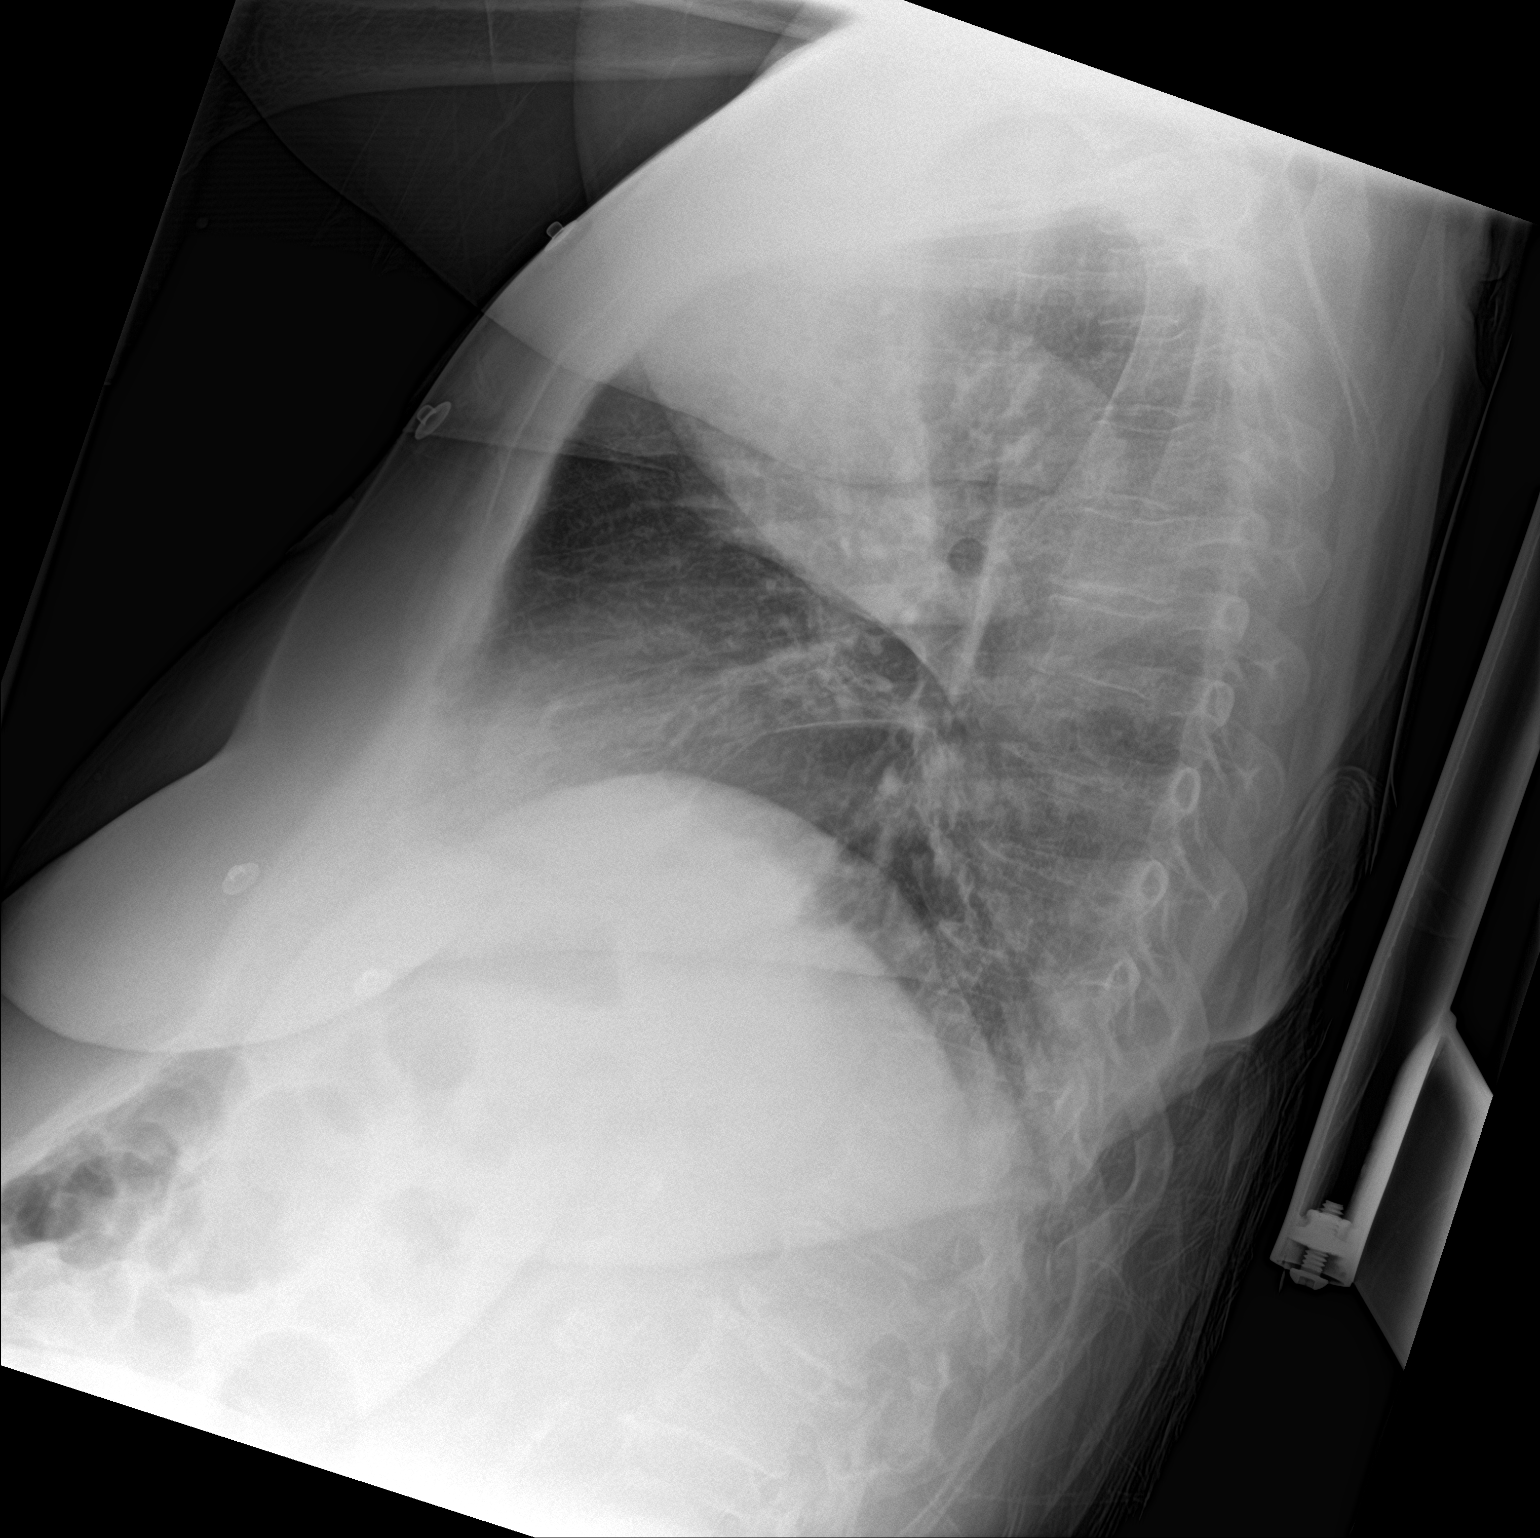

[chest ap]
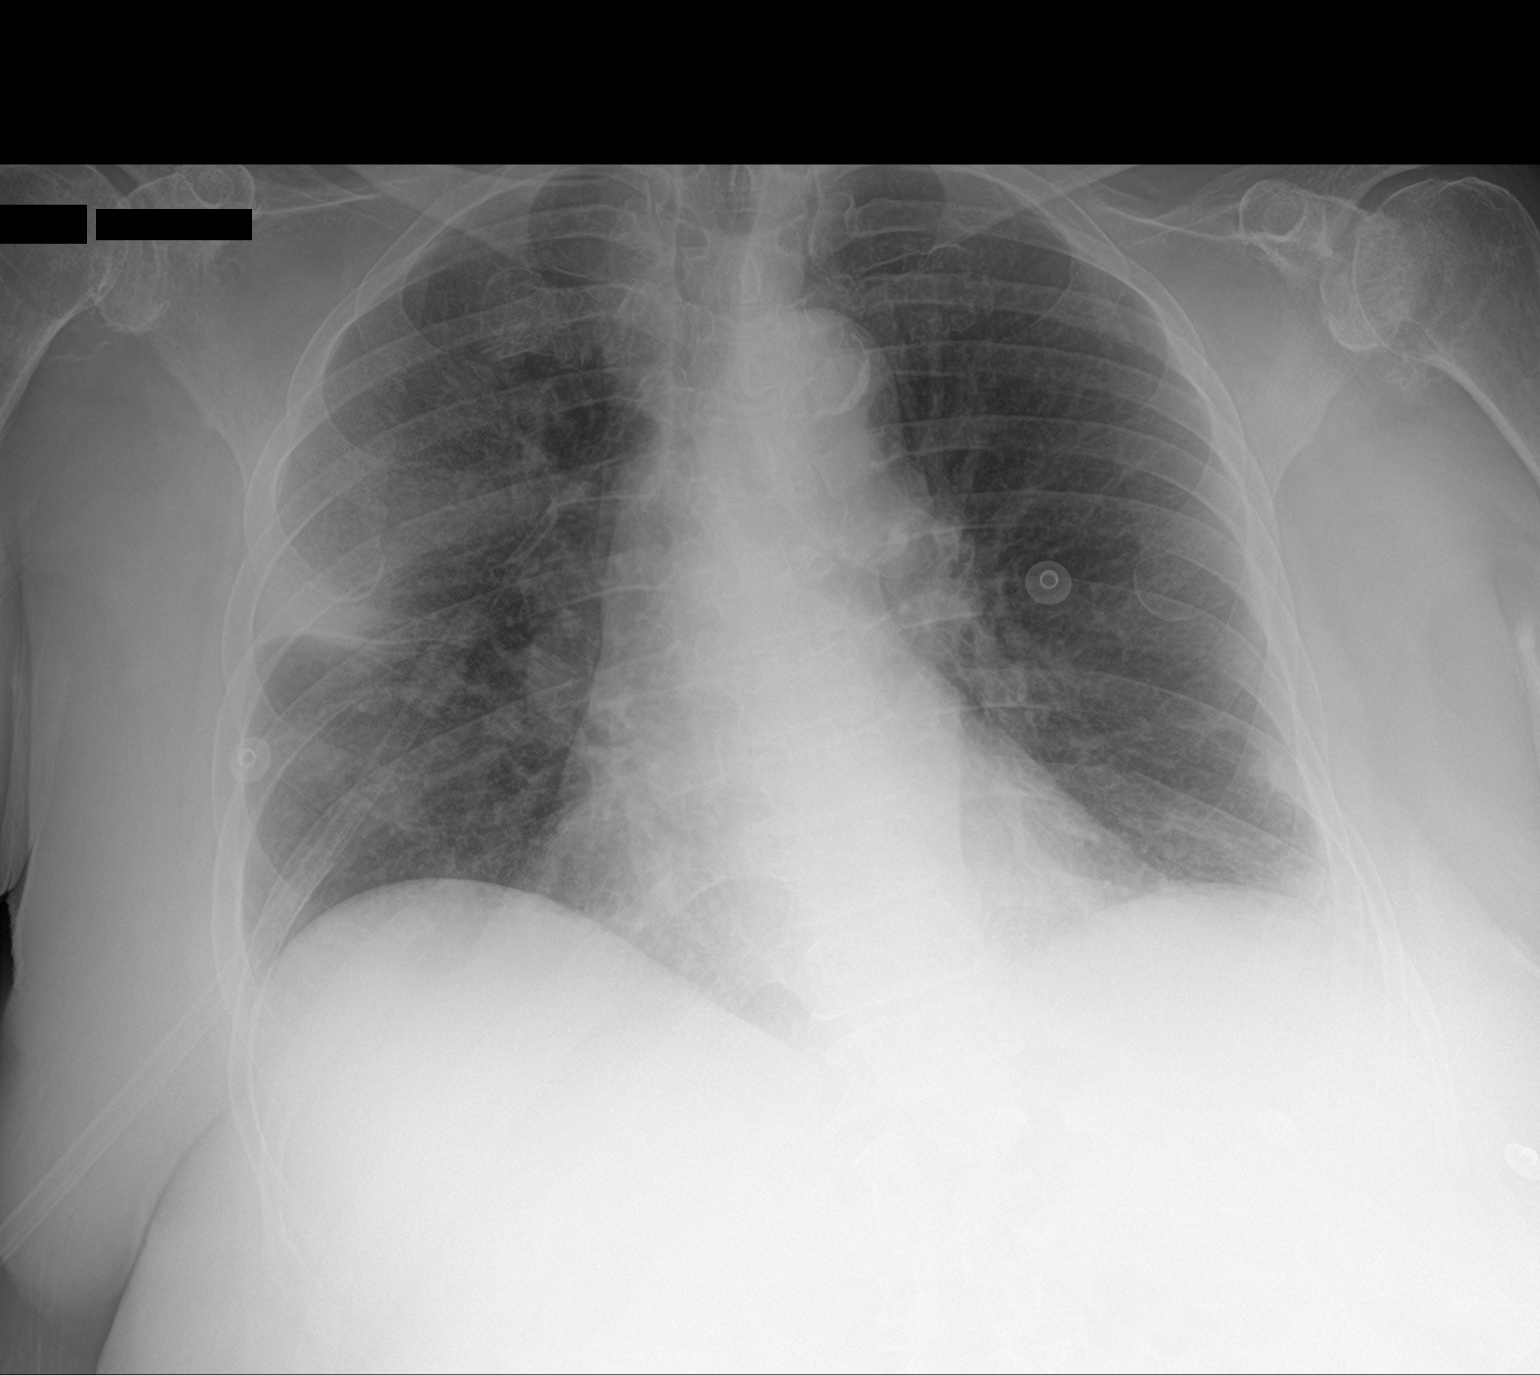

[2 of 2 positions shown; findings below may reference images not displayed]

FINDINGS: Increased ground-glass opacity within the peripheral right upper
lobe with possible small foci of peripheral airspace disease at the
left base. No pleural effusion. Stable cardiomediastinal silhouette
with aortic atherosclerosis. No pneumothorax.
IMPRESSION: Increased peripheral right upper lobe ground-glass opacity with
possible small foci of peripheral airspace disease at the left base,
findings are suspicious for multifocal pneumonia, possible atypical
or viral pneumonia.

## 2020-12-13 IMAGING — US US ABDOMEN LIMITED
1 series · 14 of 25 positions shown · non-contrast
Comparison: Prior ultrasound from 08/05/2019 and MRI from
08/08/2019

CLINICAL DATA: Initial evaluation for elevated LFTs.

EXAM:
ULTRASOUND ABDOMEN LIMITED RIGHT UPPER QUADRANT

[Series 1: us abdomen limited · 14 of 36 slices shown]
[im 1/36]
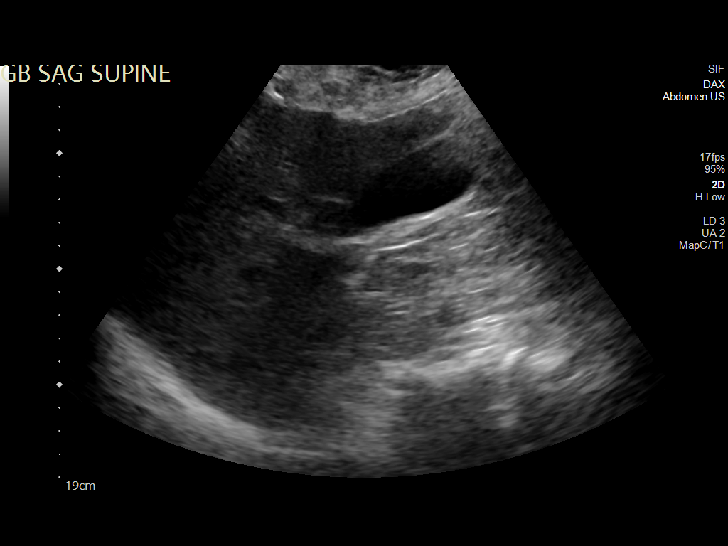
[im 3/36]
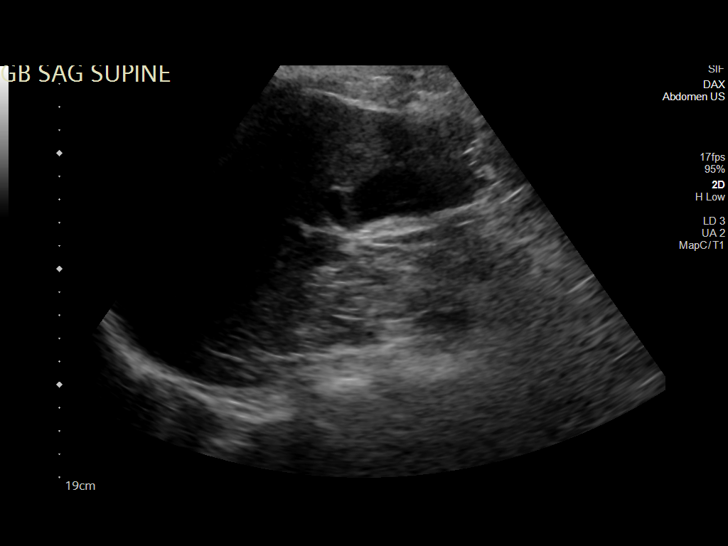
[im 6/36]
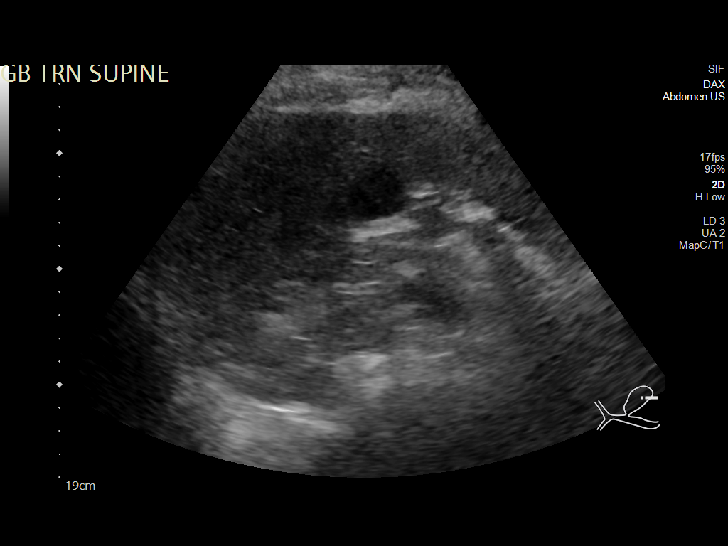
[im 9/36]
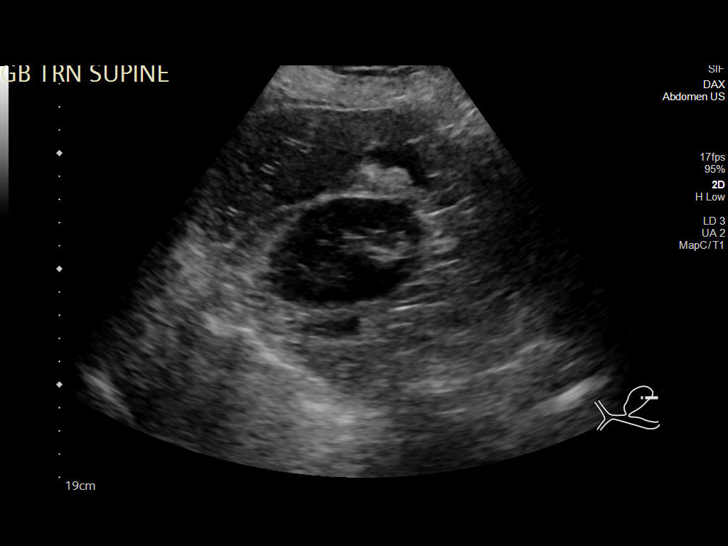
[im 12/36]
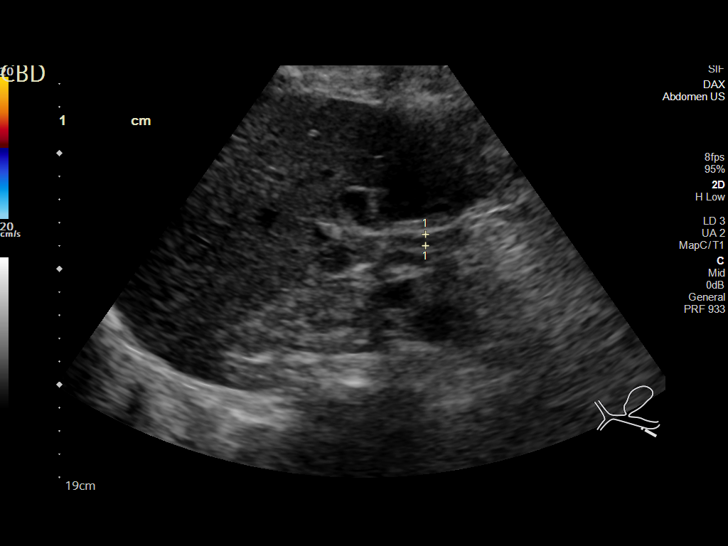
[im 14/36]
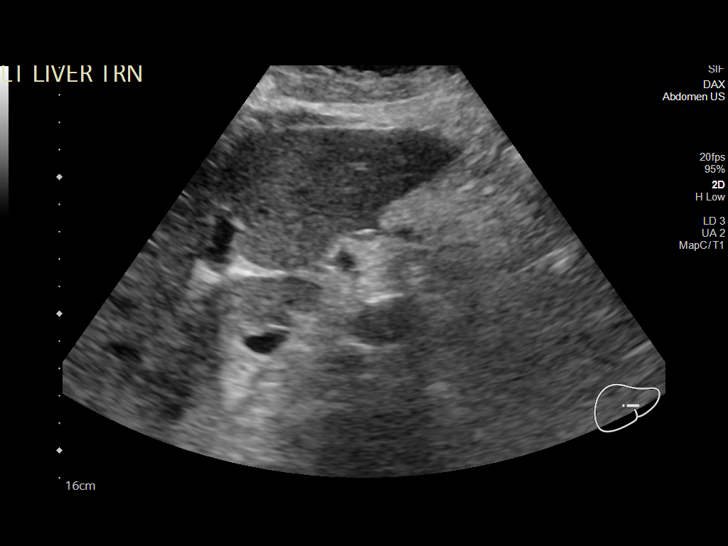
[im 17/36]
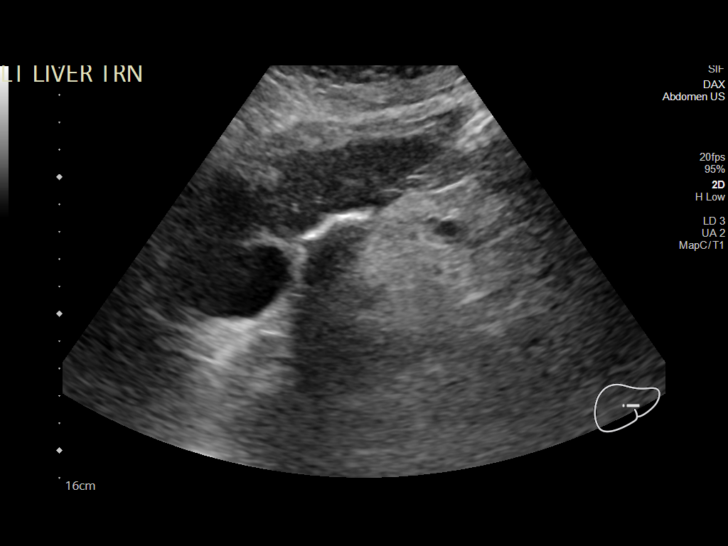
[im 19/36]
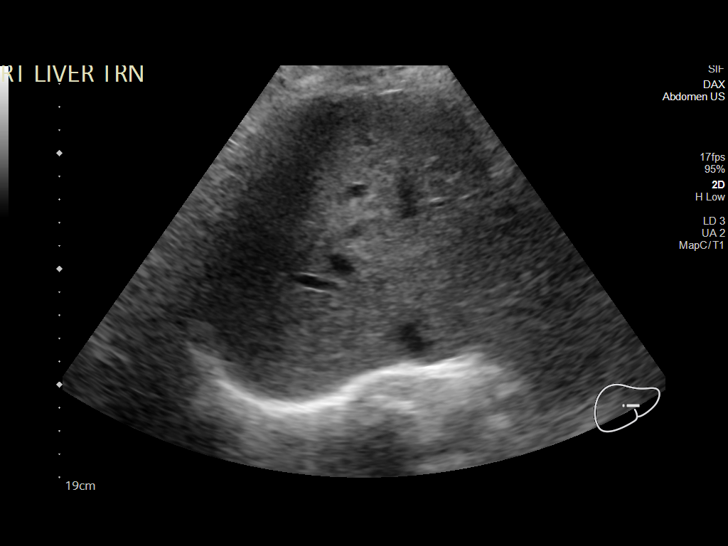
[im 22/36]
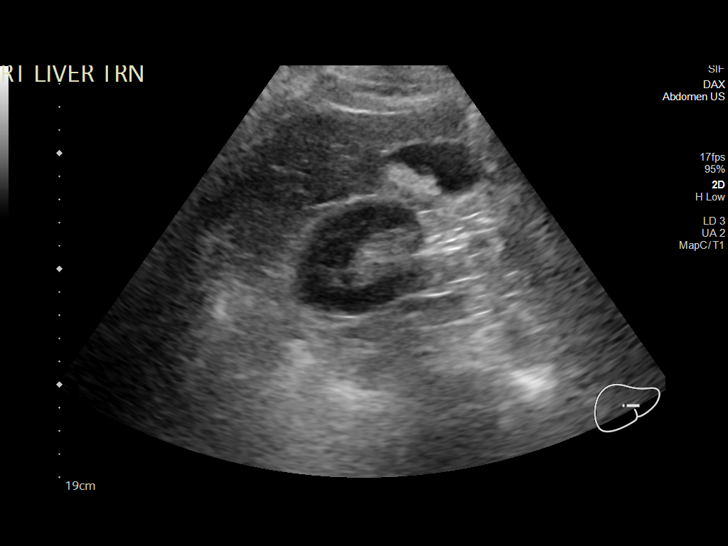
[im 24/36]
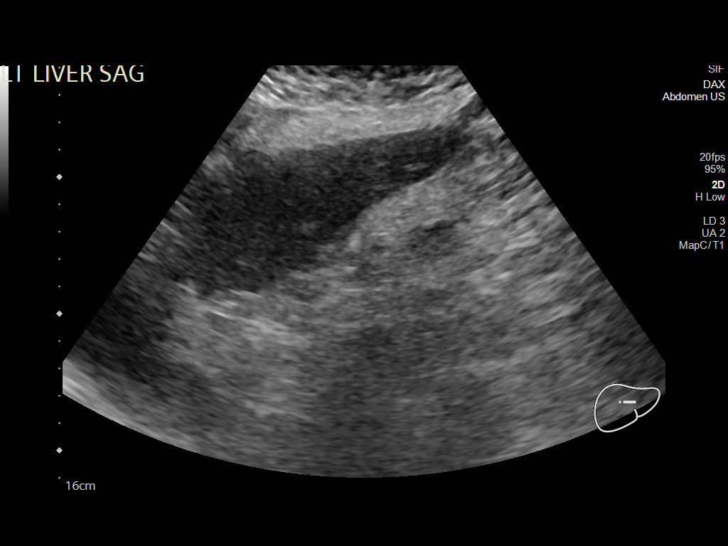
[im 27/36]
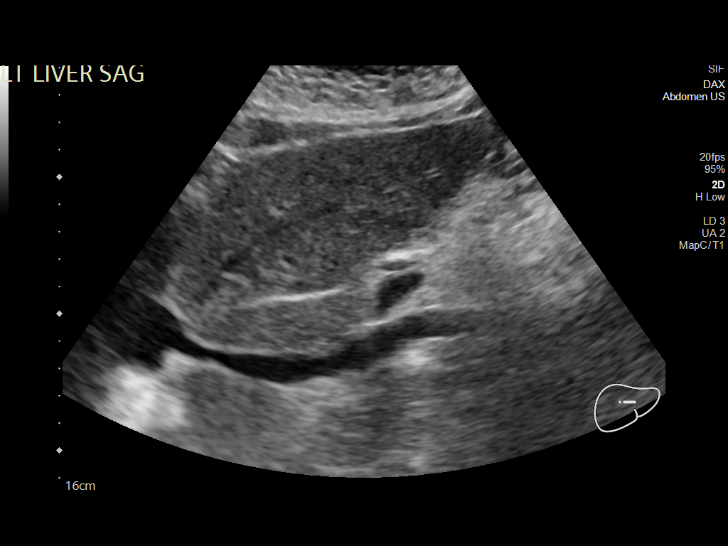
[im 30/36]
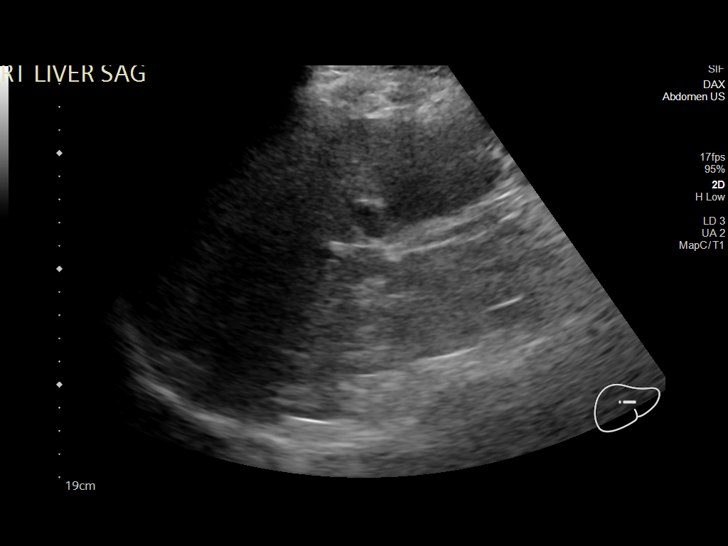
[im 33/36]
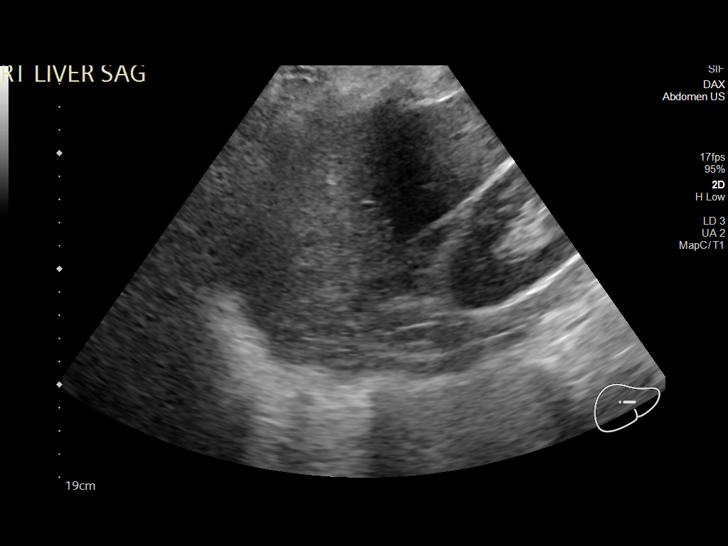
[im 36/36]
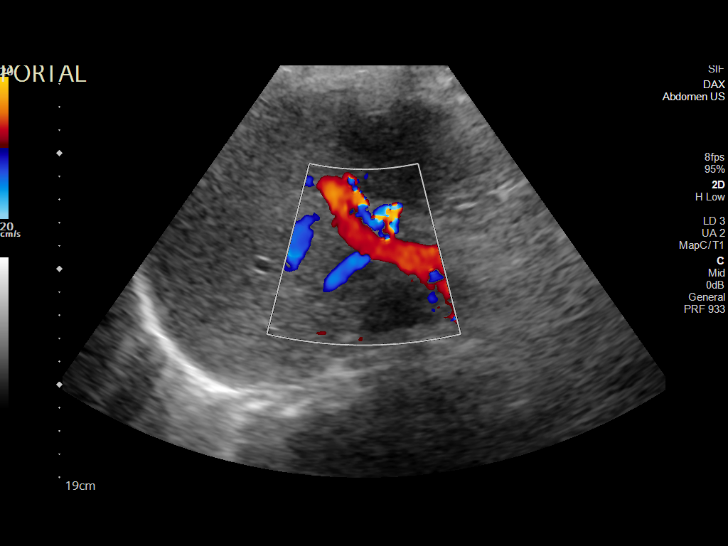

[14 of 25 positions shown; findings below may reference images not displayed]

FINDINGS: Gallbladder:

Subtle echogenic nonshadowing material within the gallbladder lumen
suggestive of a small amount of sludge. No frank shadowing stones.
Gallbladder wall measures within normal limits at 3 mm. No free
pericholecystic fluid. No sonographic Murphy sign elicited on exam.

Common bile duct:

Diameter: 5 mm

Liver:

No focal lesion identified. Liver demonstrates a heterogeneous
echogenic echotexture, suggesting steatosis. Portal vein is patent
on color Doppler imaging with normal direction of blood flow towards
the liver.

Other: None.
IMPRESSION: 1. Mild gallbladder sludging without frank cholelithiasis. No
sonographic features to suggest acute cholecystitis.
2. No biliary dilatation.
3. Coarse echogenic echotexture of the liver, suggesting steatosis.

## 2020-12-14 IMAGING — DX DG CHEST 1V
1 series · 1 of 1 positions shown · non-contrast
Comparison: 09/02/2019, 08/10/2019, 07/23/2019

CLINICAL DATA: Sepsis

EXAM:
CHEST  1 VIEW

[chest]
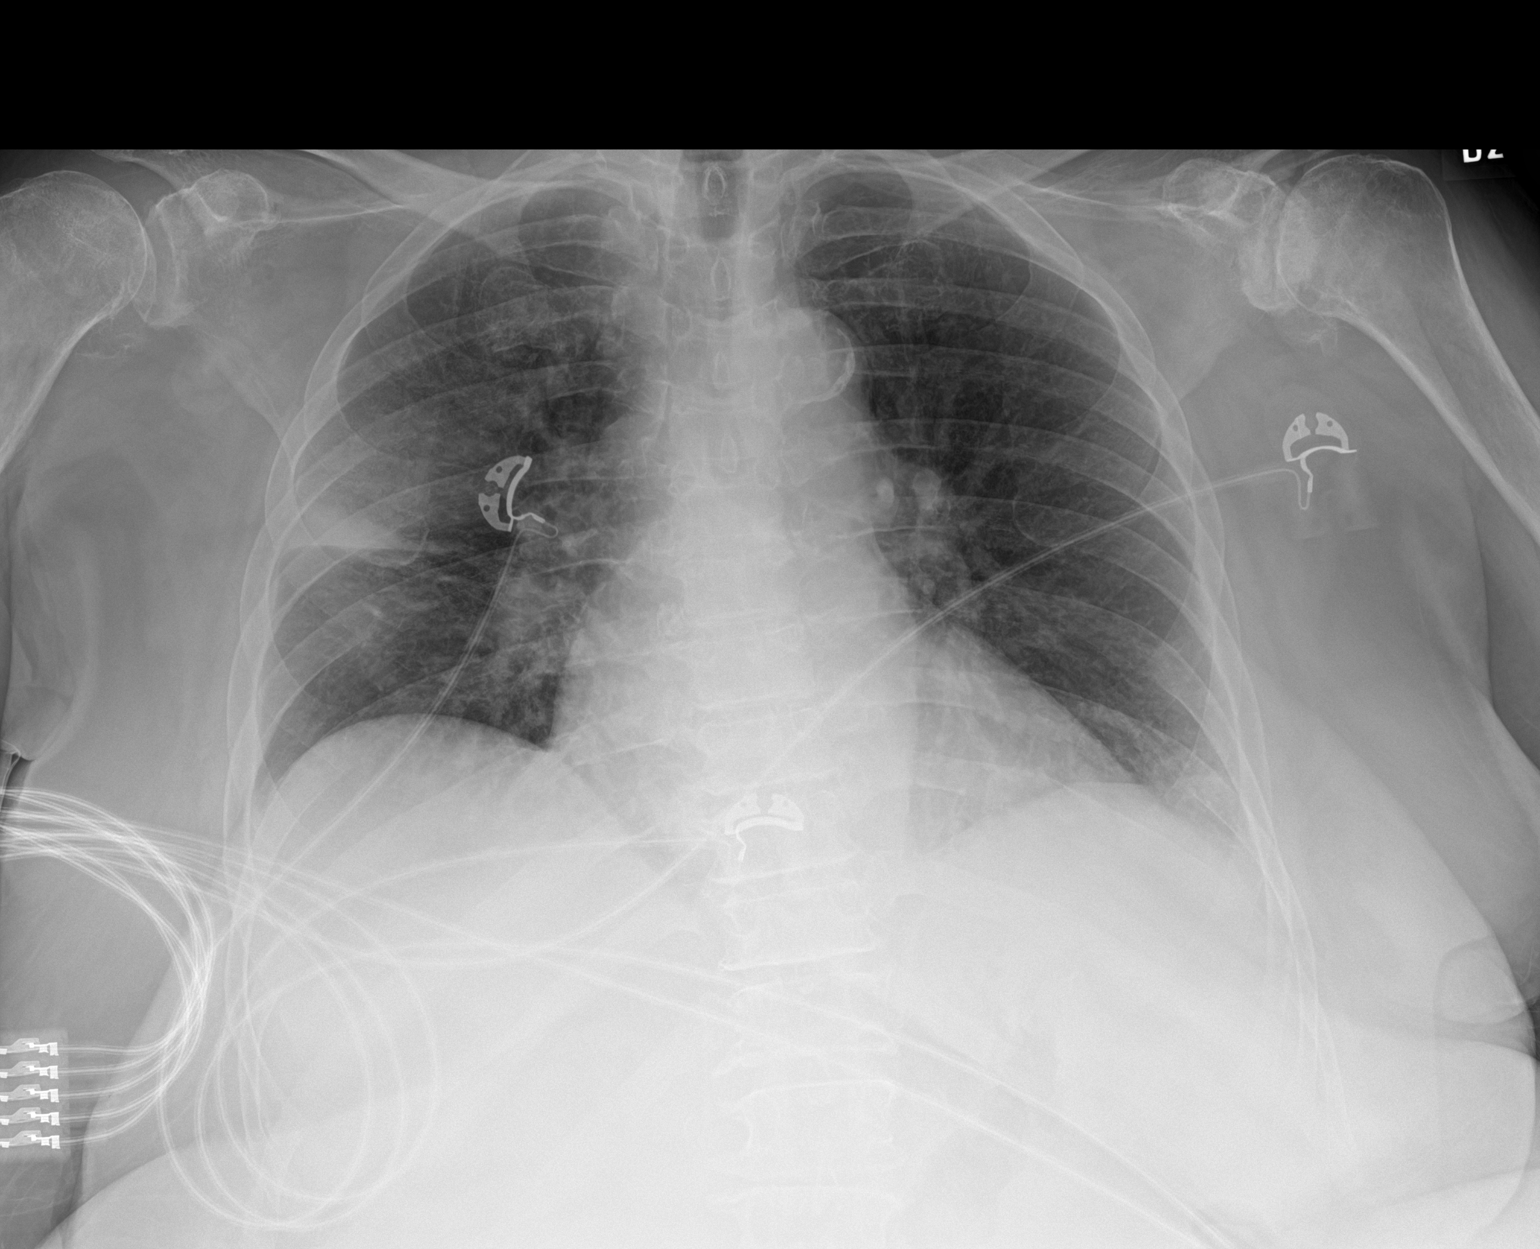

[1 of 1 positions shown; findings below may reference images not displayed]

FINDINGS: Persistent opacity in the right upper lobe adjacent to the pulmonary
fissure. Minimal patchy peripheral opacity at the left base without
change. Mild cardiomegaly with aortic atherosclerosis. No
pneumothorax.
IMPRESSION: Similar appearance of right upper lobe and left basilar pulmonary
opacity, possible pneumonia. Mild cardiomegaly.

## 2020-12-16 IMAGING — DX DG SHOULDER 1V*R*
1 series · 1 of 1 positions shown · non-contrast
Comparison: 07/24/2019

CLINICAL DATA: RIGHT shoulder pain of unspecified chronicity.

EXAM:
RIGHT SHOULDER - 1 VIEW

[shoulder]
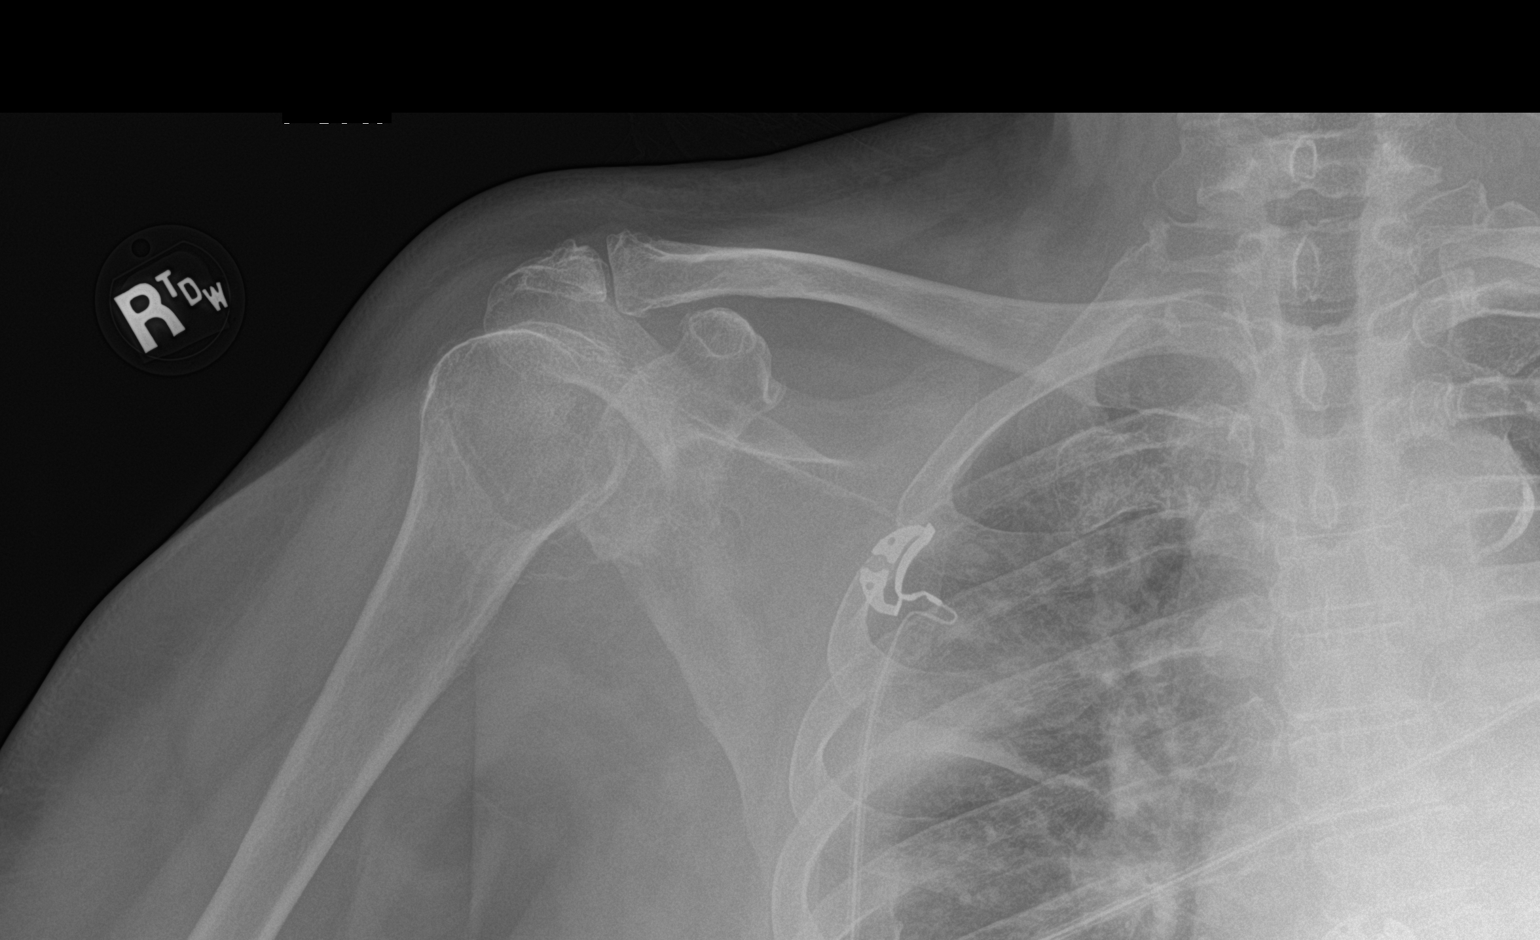

[1 of 1 positions shown; findings below may reference images not displayed]

FINDINGS: No evidence of acute or subacute fracture. Glenohumeral joint
anatomically aligned in the AP projection with severe degenerative
changes. Hypertrophic spurring involving the humeral head.
Acromioclavicular joint anatomically aligned with moderate
degenerative changes.
IMPRESSION: No acute osseous abnormality. Severe degenerative changes involving
the glenohumeral joint.

## 2021-02-11 ENCOUNTER — Telehealth: Payer: Self-pay

## 2021-02-11 NOTE — Telephone Encounter (Signed)
-----   Message from Thornton Park, MD sent at 02/09/2021  4:10 PM EST ----- Regarding: RE: EGD/Colon Yes! Why don't we at least see where she is and document it?  Thanks!  KLB ----- Message ----- From: Elias Else, CMA Sent: 02/09/2021   3:56 PM EST To: Thornton Park, MD Subject: FW: EGD/Colon                                  This reminder popped up! I know that she now lives at the [facility].  She has been declining the procedures since 09-2019 Do you want me to reach out and see about getting her scheduled? Or offer her a follow up?  Thank you,   Vivien Rota, CMA  ----- Message ----- From: Elias Else, CMA Sent: 02/07/2021   8:00 AM EST To: Elias Else, CMA Subject: EGD/Colon                                      Pt needs EGD/Colon @ [facility]  [facility] patient  See notes 11-05-2020 Has a long history of declining scopes

## 2021-02-11 NOTE — Telephone Encounter (Signed)
Left message to return call 

## 2021-02-18 NOTE — Telephone Encounter (Signed)
Left a message to return call.  

## 2021-03-03 NOTE — Telephone Encounter (Signed)
A no contact letter has been mailed to patients home address on file.

## 2021-12-09 ENCOUNTER — Emergency Department (HOSPITAL_COMMUNITY)
Admission: EM | Admit: 2021-12-09 | Discharge: 2021-12-10 | Disposition: A | Discharge status: Skilled Nursing Facility | Payer: Medicare Other | Attending: Emergency Medicine | Admitting: Emergency Medicine

## 2021-12-09 ENCOUNTER — Emergency Department (HOSPITAL_COMMUNITY): Payer: Medicare Other

## 2021-12-09 DIAGNOSIS — R112 Nausea with vomiting, unspecified: Secondary | ICD-10-CM | POA: Insufficient documentation

## 2021-12-09 DIAGNOSIS — R109 Unspecified abdominal pain: Secondary | ICD-10-CM | POA: Diagnosis present

## 2021-12-09 DIAGNOSIS — R197 Diarrhea, unspecified: Secondary | ICD-10-CM | POA: Insufficient documentation

## 2021-12-09 DIAGNOSIS — R638 Other symptoms and signs concerning food and fluid intake: Secondary | ICD-10-CM | POA: Diagnosis not present

## 2021-12-09 DIAGNOSIS — R1084 Generalized abdominal pain: Secondary | ICD-10-CM | POA: Insufficient documentation

## 2021-12-09 LAB — URINALYSIS, ROUTINE W REFLEX MICROSCOPIC
Bilirubin Urine: NEGATIVE
Glucose, UA: NEGATIVE mg/dL
Hgb urine dipstick: NEGATIVE
Ketones, ur: NEGATIVE mg/dL
Leukocytes,Ua: NEGATIVE
Nitrite: NEGATIVE
Protein, ur: NEGATIVE mg/dL
Specific Gravity, Urine: 1.009 (ref 1.005–1.030)
pH: 6 (ref 5.0–8.0)

## 2021-12-09 LAB — CBC WITH DIFFERENTIAL/PLATELET
Abs Immature Granulocytes: 0.03 10*3/uL (ref 0.00–0.07)
Basophils Absolute: 0 10*3/uL (ref 0.0–0.1)
Basophils Relative: 0 %
Eosinophils Absolute: 0.3 10*3/uL (ref 0.0–0.5)
Eosinophils Relative: 3 %
HCT: 38.3 % (ref 36.0–46.0)
Hemoglobin: 12.7 g/dL (ref 12.0–15.0)
Immature Granulocytes: 0 %
Lymphocytes Relative: 17 %
Lymphs Abs: 1.5 10*3/uL (ref 0.7–4.0)
MCH: 30.7 pg (ref 26.0–34.0)
MCHC: 33.2 g/dL (ref 30.0–36.0)
MCV: 92.5 fL (ref 80.0–100.0)
Monocytes Absolute: 0.5 10*3/uL (ref 0.1–1.0)
Monocytes Relative: 5 %
Neutro Abs: 6.2 10*3/uL (ref 1.7–7.7)
Neutrophils Relative %: 75 %
Platelets: 148 10*3/uL — ABNORMAL LOW (ref 150–400)
RBC: 4.14 MIL/uL (ref 3.87–5.11)
RDW: 13.1 % (ref 11.5–15.5)
WBC: 8.5 10*3/uL (ref 4.0–10.5)
nRBC: 0 % (ref 0.0–0.2)

## 2021-12-09 LAB — COMPREHENSIVE METABOLIC PANEL
ALT: 11 U/L (ref 0–44)
AST: 14 U/L — ABNORMAL LOW (ref 15–41)
Albumin: 3.3 g/dL — ABNORMAL LOW (ref 3.5–5.0)
Alkaline Phosphatase: 128 U/L — ABNORMAL HIGH (ref 38–126)
Anion gap: 6 (ref 5–15)
BUN: 12 mg/dL (ref 8–23)
CO2: 24 mmol/L (ref 22–32)
Calcium: 8.7 mg/dL — ABNORMAL LOW (ref 8.9–10.3)
Chloride: 109 mmol/L (ref 98–111)
Creatinine, Ser: 0.78 mg/dL (ref 0.44–1.00)
GFR, Estimated: >60 mL/min (ref >60)
Glucose, Bld: 156 mg/dL — ABNORMAL HIGH (ref 70–99)
Potassium: 3.4 mmol/L — ABNORMAL LOW (ref 3.5–5.1)
Sodium: 139 mmol/L (ref 135–145)
Total Bilirubin: 0.6 mg/dL (ref 0.3–1.2)
Total Protein: 6.2 g/dL — ABNORMAL LOW (ref 6.5–8.1)

## 2021-12-09 LAB — TROPONIN I (HIGH SENSITIVITY): Troponin I (High Sensitivity): 6 ng/L (ref <18)

## 2021-12-09 LAB — LIPASE, BLOOD: Lipase: 24 U/L (ref 11–51)

## 2021-12-09 MED ORDER — MORPHINE SULFATE (PF) 4 MG/ML IV SOLN
4.0000 mg | Freq: Once | INTRAVENOUS | Status: AC
  Administered 2021-12-09: 4 mg via INTRAVENOUS
  Filled 2021-12-09: qty 1

## 2021-12-09 MED ORDER — ONDANSETRON HCL 4 MG/2ML IJ SOLN
4.0000 mg | Freq: Once | INTRAMUSCULAR | Status: AC
  Administered 2021-12-09: 4 mg via INTRAVENOUS
  Filled 2021-12-09: qty 2

## 2021-12-09 MED ORDER — LACTATED RINGERS IV BOLUS
1000.0000 mL | Freq: Once | INTRAVENOUS | Status: AC
  Administered 2021-12-09: 1000 mL via INTRAVENOUS

## 2021-12-09 MED ORDER — IOHEXOL 350 MG/ML SOLN
75.0000 mL | Freq: Once | INTRAVENOUS | Status: AC | PRN
  Administered 2021-12-09: 75 mL via INTRAVENOUS

## 2021-12-09 NOTE — ED Notes (Signed)
Ptar called, 4th one out

## 2021-12-09 NOTE — ED Triage Notes (Signed)
BIB [facility] from [place]. N/V/D starting yesterday. HA today with generalize abdominal pain. Tender upon arrival to ED in epigastric region. Afebrile.   EMS: 165CBG 150/90 70 100% RA  Has tolerated food today.

## 2021-12-09 NOTE — Discharge Instructions (Addendum)
You were seen today for abdominal pain.  You likely have a developing stomach infection, you are otherwise well-appearing though.  Your labs are reassuring.  Importantly, your CT scan has findings of retroperitoneal adenopathy which means you need to be evaluated for lymphoma in the outpatient setting.  Your primary care provider can follow this up and may need to order a PET scan or repeat CT.

## 2021-12-09 NOTE — ED Notes (Signed)
Patient transported to CT 

## 2021-12-09 NOTE — ED Provider Notes (Signed)
[facility] EMERGENCY DEPARTMENT Provider Note   CSN: [id number] Arrival date & time: 12/09/21  1642     History Chief Complaint  Patient presents with   Abdominal Pain    HPI Heather Petty is a 71 y.o. female presenting for multiple complaints, primarily, patient endorses nausea and vomiting, abd pain, diarrhea, poor p.o. intake today, feelings of dehydration.  She states that these symptoms have all been progressive over the last 72 hours.  She states that she did not want to come in but her skilled nursing facility made her come in for further care and management.  She is now tolerating p.o. intake after a prolonged wait in the emergency department.   Patient's recorded medical, surgical, social, medication list and allergies were reviewed in the Snapshot window as part of the initial history.   Review of Systems   Review of Systems  Constitutional:  Negative for chills and fever.  HENT:  Negative for ear pain and sore throat.   Eyes:  Negative for pain and visual disturbance.  Respiratory:  Negative for cough and shortness of breath.   Cardiovascular:  Negative for chest pain and palpitations.  Gastrointestinal:  Positive for abdominal pain, diarrhea, nausea and vomiting.  Genitourinary:  Negative for dysuria and hematuria.  Musculoskeletal:  Negative for arthralgias and back pain.  Skin:  Negative for color change and rash.  Neurological:  Negative for seizures and syncope.  All other systems reviewed and are negative.   Physical Exam Updated Vital Signs BP (!) 159/65   Pulse (!) 59   Temp 98.9 F (37.2 C) (Oral)   Resp (!) 21   Ht 5' (1.524 m)   Wt 100 kg   SpO2 94%   BMI 43.06 kg/m  Physical Exam Vitals and nursing note reviewed.  Constitutional:      General: She is not in acute distress.    Appearance: She is well-developed.  HENT:     Head: Normocephalic and atraumatic.  Eyes:     Conjunctiva/sclera: Conjunctivae normal.   Cardiovascular:     Rate and Rhythm: Normal rate and regular rhythm.     Heart sounds: No murmur heard. Pulmonary:     Effort: Pulmonary effort is normal. No respiratory distress.     Breath sounds: Normal breath sounds.  Abdominal:     General: There is no distension.     Palpations: Abdomen is soft.     Tenderness: There is abdominal tenderness. There is no right CVA tenderness, left CVA tenderness or guarding.  Musculoskeletal:        General: No swelling or tenderness. Normal range of motion.     Cervical back: Neck supple.  Skin:    General: Skin is warm and dry.  Neurological:     General: No focal deficit present.     Mental Status: She is alert and oriented to person, place, and time. Mental status is at baseline.     Cranial Nerves: No cranial nerve deficit.      ED Course/ Medical Decision Making/ A&P    Procedures Procedures   Medications Ordered in ED Medications  morphine (PF) 4 MG/ML injection 4 mg (4 mg Intravenous Given 12/09/21 1726)  lactated ringers bolus 1,000 mL (0 mLs Intravenous Stopped 12/09/21 1920)  ondansetron (ZOFRAN) injection 4 mg (4 mg Intravenous Given 12/09/21 1723)  iohexol (OMNIPAQUE) 350 MG/ML injection 75 mL (75 mLs Intravenous Contrast Given 12/09/21 1930)   Medical Decision Making:   Heather Petty is  a 71 y.o. female who presented to the ED today with abdominal pain, detailed above.    Handoff received from EMS.  Patient's presentation is complicated by their history of multiple comorbid medical problems on multiple outpatient medications.  Patient placed on continuous vitals and telemetry monitoring while in ED which was reviewed periodically.  Complete initial physical exam performed, notably the patient  was hemodynamically stable in no acute distress, mild abdominal tenderness diffusely.     Reviewed and confirmed nursing documentation for past medical history, family history, social history.    Initial Assessment:   With the  patient's presentation of abdominal pain, most likely diagnosis is nonspecific etiology, possible developing gastroenteritis. Other diagnoses were considered including (but not limited to)  colitis, small bowel obstruction, appendicitis, cholecystitis, pancreatitis, nephrolithiasis, UTI, pyleonephritis. These are considered less likely due to history of present illness and physical exam findings.   This is most consistent with an acute life/limb threatening illness complicated by underlying chronic conditions.   Initial Plan:  CBC/CMP to evaluate for underlying infectious/metabolic etiology for patient's abdominal pain  Lipase to evaluate for pancreatitis  EKG/troponin to evaluate for cardiac source of pain  CTAB/Pelvis with contrast to evaluate for structural/surgical etiology of patients' severe abdominal pain.  Urinalysis and repeat physical assessment to evaluate for UTI/Pyelonpehritis  Empiric management of symptoms with escalating pain control and antiemetics as needed.   Initial Study Results:   Laboratory  All laboratory results reviewed without evidence of clinically relevant pathology.      EKG EKG was reviewed independently. Rate, rhythm, axis, intervals all examined and without medically relevant abnormality. ST segments without concerns for elevations.    Radiology All images reviewed independently. Agree with radiology report at this time.   CT ABDOMEN PELVIS W CONTRAST  Result Date: 12/09/2021 CLINICAL DATA:  Acute abdominal pain. Nausea, vomiting, diarrhea. EXAM: CT ABDOMEN AND PELVIS WITH CONTRAST TECHNIQUE: Multidetector CT imaging of the abdomen and pelvis was performed using the standard protocol following bolus administration of intravenous contrast. RADIATION DOSE REDUCTION: This exam was performed according to the departmental dose-optimization program which includes automated exposure control, adjustment of the mA and/or kV according to patient size and/or use of  iterative reconstruction technique. CONTRAST:  87m OMNIPAQUE IOHEXOL 350 MG/ML SOLN COMPARISON:  CT 07/21/2019 FINDINGS: Lower chest: Clear lung bases. Hepatobiliary: Diffusely decreased hepatic density typical of steatosis. There is no focal hepatic lesion. Small calcified stone in layering sludge in the gallbladder. No pericholecystic fat stranding or inflammation. Common bile duct measures 7-8 mm which is likely normal for age. No visualized choledocholithiasis. Pancreas: Fatty atrophy.  No ductal dilatation or inflammation. Spleen: Upper normal in size, 11.9 cm AP. No focal abnormalities. Adrenals/Urinary Tract: No adrenal nodule. There is no hydronephrosis. Symmetric bilateral perinephric edema. No evidence of focal renal lesion or stone. The urinary bladder is partially distended. No bladder wall thickening. Stomach/Bowel: The stomach is nondistended and not well assessed. There is no small bowel wall thickening or obstruction. Normal appendix visualized. Small to moderate colonic stool burden. Multifocal colonic diverticulosis, most prominent in the sigmoid. There is no definite diverticulitis or acute colonic inflammation. No obvious colonic mass. Vascular/Lymphatic: Retroperitoneal adenopathy was not seen on prior exam. Left periaortic adenopathy, including a 2.1 x 3.6 cm node series 3, image 34. This is contiguous with enlarged and prominent left periaortic nodes extending to the iliac bifurcation. There is mild perinodal edema. There is also a mildly enlarged aortocaval node at 13 mm series 3, image 37.  More upper retroperitoneal lymph nodes are prominent measuring up to 9 mm, not discretely enlarged by size criteria. Small porta hepatis nodes, largest 7 mm. There is no pelvic or inguinal adenopathy. Moderate aortic and branch atherosclerosis. No aortic aneurysm. The portal and splenic veins are patent. Reproductive: Small uterine calcification may represent a small fibroid. There is no adnexal mass.  Other: No ascites. No free air or focal fluid collection. There is mild stranding of the subcutaneous fat posterior to the left iliac bone, chronic and improved from prior exam. No subcutaneous collection. Musculoskeletal: Diffuse degenerative change throughout the lower thoracic and lumbar spine. Occasional bone islands in the pelvis. IMPRESSION: 1. Retroperitoneal adenopathy, not seen on prior exam, primarily in the left periaortic station where there is a 2.1 x 3.6 cm node. There is mild perinodal edema. Lymph nodes are larger than typically seen with reactive adenopathy, and are suspicious for lymphoproliferative disorder such as lymphoma. Recommend clinical/laboratory correlation for lymphoproliferative disorder. Consider further evaluation with PET-CT. If PET-CT is not pursued, recommend short interval follow-up CT in 2-3 months. 2. Colonic diverticulosis without acute inflammation. 3. Hepatic steatosis. Cholelithiasis without gallbladder inflammation. Aortic Atherosclerosis (ICD10-I70.0). Electronically Signed   By: Keith Rake M.D.   On: 12/09/2021 20:02    Final Reassessment and Plan:   Reevaluated patient at bedside, she has now been tolerating p.o. intake and is requesting discharge back to her skilled nursing facility.  I discussed the findings on her CT scan including the substantial adenopathy that needs to be followed up by her primary care provider.  There be concern for potential lymphoma. Patient is currently in no acute distress hemodynamically stable and has tolerated dinner, will have patient follow-up with her PCP in the outpatient setting and is otherwise stable for discharge home at this time.     Clinical Impression:  1. Generalized abdominal pain      Discharge   Final Clinical Impression(s) / ED Diagnoses Final diagnoses:  Generalized abdominal pain    Rx / DC Orders ED Discharge Orders     None         Tretha Sciara, MD 12/09/21 2152

## 2021-12-14 ENCOUNTER — Telehealth: Payer: Self-pay | Admitting: Physician Assistant

## 2021-12-14 NOTE — Telephone Encounter (Signed)
Scheduled appointment per 11/07 referral. Patient is aware of appointment date and time. Patient is aware to arrive 15 mins prior to appointment time and to bring updated insurance cards. Patient is aware of location.

## 2021-12-19 NOTE — Progress Notes (Signed)
[place] Telephone:([phone]) [phone]   Fax:(336) [phone]  INITIAL CONSULTATION:  Patient Care Team: Pcp, No as PCP - General [name], MD (Internal Medicine)  CHIEF COMPLAINTS/PURPOSE OF CONSULTATION:  Retroperitoneal lymphadenopathy  HISTORY OF PRESENTING ILLNESS:  [name] 71 y.o. female with medical history significant for diabetes, GERD, hypertension, NASH and stroke presents to the diagnostic clinic for evaluation for retroperitoneal lymphadenopathy. She is unaccompanied for this visit.   On review of the previous records, Ms. Levit presented to the ED on 12/09/2021 due to nausea, vomiting, abdominal pain, diarrhea and poor p.o. intake.  She underwent CT imaging of the abdomen and pelvis revealed retroperitoneal adenopathy, primarily in the left periaortic station measuring 2.1 x 3.6 cm.  On exam today, Ms. Lovie Chol reports chronic fatigue and has some difficulty with her ADLs. She is currently residing in a rehab facility and ambulates using a wheelchair due to chronic knee pain. Her appetite is unchanged and she denies any weight loss. She reports intermittent episodes of nausea and vomiting. Her epigastric pain which led her to the ED has improved on only mild in intensity. She has persistent diarrhea with one episode per day. She denies easy bruising or signs of active bleeding. She denies fevers, chills, sweats, shortness of breath, chest pain or cough. She has no other complaints. Rest of the 10 point ROS is below.   MEDICAL HISTORY:  Past Medical History:  Diagnosis Date   AKI (acute kidney injury) ([place]) 12/17/2017   Allergic rhinitis    Anemia    Arthritis    Asthma    Autonomic neuropathy    Depression    Diabetes mellitus    Diabetes mellitus without complication (HCC)    E coli infection    GERD (gastroesophageal reflux disease)    GERD (gastroesophageal reflux disease)    Gout    Hepatitis A    High  cholesterol    Hypertension    Insomnia    Kidney failure    Metabolic encephalopathy    Morbid obesity (HCC)    NASH (nonalcoholic steatohepatitis)    Pneumonia 12/17/2017   Schizophrenia ([place])    Stroke (HCC)    Vitamin B deficiency    Vitamin D deficiency     SURGICAL HISTORY: Past Surgical History:  Procedure Laterality Date   CYST EXCISION     buttucks   DENTAL SURGERY     TONSILLECTOMY      SOCIAL HISTORY: Social History   Socioeconomic History   Marital status: Unknown    Spouse name: Not on file   Number of children: 2   Years of education: Not on file   Highest education level: Not on file  Occupational History   Occupation: retired/disabled  Tobacco Use   Smoking status: Never   Smokeless tobacco: Never  Vaping Use   Vaping Use: Never used  Substance and Sexual Activity   Alcohol use: Never   Drug use: Never   Sexual activity: Not on file  Other Topics Concern   Not on file  Social History Narrative   ** Merged History Encounter **       Social Determinants of Health   Financial Resource Strain: Not on file  Food Insecurity: Not on file  Transportation Needs: Not on file  Physical Activity: Not on file  Stress: Not on file  Social Connections: Not on file  Intimate Partner Violence: Not on file    FAMILY HISTORY: Family History  Adopted:  Yes  Problem Relation Age of Onset   COPD Daughter     ALLERGIES:  is allergic to vancomycin, lasix [furosemide], and lasix [furosemide].  MEDICATIONS:  Current Outpatient Medications  Medication Sig Dispense Refill   albuterol (VENTOLIN HFA) 108 (90 Base) MCG/ACT inhaler Inhale 2 puffs into the lungs every 4 (four) hours as needed for wheezing or shortness of breath.     allopurinol (ZYLOPRIM) 300 MG tablet Take 300 mg by mouth daily.     amLODipine (NORVASC) 10 MG tablet Take 10 mg by mouth daily.     ammonium lactate (LAC-HYDRIN) 12 % lotion Apply 1 application topically daily as needed for dry  skin.     carvedilol (COREG) 6.25 MG tablet Take 6.25 mg by mouth 2 (two) times daily with a meal.     Dextromethorphan-guaiFENesin 5-100 MG/5ML LIQD Take 10 mLs by mouth every 6 (six) hours as needed.     doxepin (SINEQUAN) 25 MG capsule Take 25 mg by mouth at bedtime.     famotidine (PEPCID) 20 MG tablet Take 20 mg by mouth 2 (two) times daily.     glipiZIDE (GLUCOTROL XL) 10 MG 24 hr tablet Take 10 mg by mouth 2 (two) times daily.      HYDROcodone-acetaminophen (NORCO/VICODIN) 5-325 MG tablet Take 1 tablet by mouth 2 (two) times daily.     hydrOXYzine (ATARAX/VISTARIL) 25 MG tablet Take 25 mg by mouth in the morning and at bedtime.     insulin aspart (NOVOLOG) 100 UNIT/ML injection Inject 0-12 Units into the skin See admin instructions. Inject 0-12 units into the skin 4 times a day (before meals and at bedtime) per sliding scale: BGL <60 or >400  = CALL MD; 60-200 = 0 units; 201-250 = 2 units; 251-300 = 4 units; 301-350 = 6 units; 351-400 = 8 units; 401-450 = 10 units; >450 = 12 units     insulin glargine (LANTUS) 100 UNIT/ML injection Inject 0.1 mLs (10 Units total) into the skin at bedtime. (Patient taking differently: Inject 22 Units into the skin at bedtime.)     lidocaine (LIDODERM) 5 % Place 1 patch onto the skin daily. Remove & Discard patch within 12 hours or as directed by MD     lisinopril (ZESTRIL) 5 MG tablet Take 5 mg by mouth daily.     loperamide (IMODIUM) 2 MG capsule Take 1 capsule (2 mg total) by mouth as needed for diarrhea or loose stools. 30 capsule 0   Polyethyl Glycol-Propyl Glycol (SYSTANE ULTRA) 0.4-0.3 % SOLN Place 2 drops into both eyes in the morning and at bedtime.     polyethylene glycol (MIRALAX / GLYCOLAX) 17 g packet Take 17 g by mouth daily as needed for mild constipation.     risperiDONE (RISPERDAL) 0.5 MG tablet Take 0.5 mg by mouth daily.     Semaglutide,0.25 or 0.5MG/DOS, (OZEMPIC, 0.25 OR 0.5 MG/DOSE,) 2 MG/1.5ML SOPN Inject 0.5 mg into the skin once a week.  On Sunday     triamcinolone (NASACORT ALLERGY 24HR) 55 MCG/ACT AERO nasal inhaler Place 1 spray into the nose daily.     No current facility-administered medications for this visit.    REVIEW OF SYSTEMS:   Constitutional: ( - ) fevers, ( - )  chills , ( - ) night sweats Eyes: ( - ) blurriness of vision, ( - ) double vision, ( - ) watery eyes Ears, nose, mouth, throat, and face: ( - ) mucositis, ( - ) sore throat Respiratory: ( - )  cough, ( - ) dyspnea, ( - ) wheezes Cardiovascular: ( - ) palpitation, ( - ) chest discomfort, ( - ) lower extremity swelling Gastrointestinal:  ( + ) nausea, ( - ) heartburn, ( - ) change in bowel habits Skin: ( - ) abnormal skin rashes Lymphatics: ( - ) new lymphadenopathy, ( - ) easy bruising Neurological: ( - ) numbness, ( - ) tingling, ( - ) new weaknesses Behavioral/Psych: ( - ) mood change, ( - ) new changes  All other systems were reviewed with the patient and are negative.  PHYSICAL EXAMINATION: ECOG PERFORMANCE STATUS: 2 - Symptomatic, <50% confined to bed  There were no vitals filed for this visit. There were no vitals filed for this visit.  GENERAL: well appearing female in NAD. Patient is in a wheelchair during visit.  SKIN: skin color, texture, turgor are normal, no rashes or significant lesions EYES: conjunctiva are pink and non-injected, sclera clear OROPHARYNX: no exudate, no erythema; lips, buccal mucosa, and tongue normal  NECK: supple, non-tender LYMPH:  no palpable lymphadenopathy in the cervical or supraclavicular lymph nodes.  LUNGS: clear to auscultation and percussion with normal breathing effort HEART: regular rate & rhythm and no murmurs and no lower extremity edema Musculoskeletal: no cyanosis of digits and no clubbing  PSYCH: alert & oriented x 3, fluent speech NEURO: no focal motor/sensory deficits  LABORATORY DATA:  I have reviewed the data as listed    Latest Ref Rng & Units 12/09/2021    5:23 PM 09/30/2019   11:59 AM  09/08/2019   12:50 AM  CBC  WBC 4.0 - 10.5 K/uL 8.5  7.8  8.9   Hemoglobin 12.0 - 15.0 g/dL 12.7  14.2  7.9   Hematocrit 36.0 - 46.0 % 38.3  43.6  25.8   Platelets 150 - 400 K/uL 148  148.0  264        Latest Ref Rng & Units 12/09/2021    5:23 PM 09/08/2019   12:50 AM 09/07/2019    2:10 AM  CMP  Glucose 70 - 99 mg/dL 156  86  73   BUN 8 - 23 mg/dL _0 Creatinine 0.44 - 1.00 mg/dL 0.78  0.79  0.78   Sodium 135 - 145 mmol/L 139  140  140   Potassium 3.5 - 5.1 mmol/L 3.4  3.7  3.9   Chloride 98 - 111 mmol/L 109  110  109   CO2 22 - 32 mmol/L _1 Calcium 8.9 - 10.3 mg/dL 8.7  8.7  8.8   Total Protein 6.5 - 8.1 g/dL 6.2     Total Bilirubin 0.3 - 1.2 mg/dL 0.6     Alkaline Phos 38 - 126 U/L 128     AST 15 - 41 U/L 14     ALT 0 - 44 U/L 11        RADIOGRAPHIC STUDIES: I have personally reviewed the radiological images as listed and agreed with the findings in the report. CT ABDOMEN PELVIS W CONTRAST  Result Date: 12/09/2021 CLINICAL DATA:  Acute abdominal pain. Nausea, vomiting, diarrhea. EXAM: CT ABDOMEN AND PELVIS WITH CONTRAST TECHNIQUE: Multidetector CT imaging of the abdomen and pelvis was performed using the standard protocol following bolus administration of intravenous contrast. RADIATION DOSE REDUCTION: This exam was performed according to the departmental dose-optimization program which includes automated exposure control, adjustment of the mA and/or kV according to patient size and/or use of  iterative reconstruction technique. CONTRAST:  38m OMNIPAQUE IOHEXOL 350 MG/ML SOLN COMPARISON:  CT 07/21/2019 FINDINGS: Lower chest: Clear lung bases. Hepatobiliary: Diffusely decreased hepatic density typical of steatosis. There is no focal hepatic lesion. Small calcified stone in layering sludge in the gallbladder. No pericholecystic fat stranding or inflammation. Common bile duct measures 7-8 mm which is likely normal for age. No visualized choledocholithiasis. Pancreas:  Fatty atrophy.  No ductal dilatation or inflammation. Spleen: Upper normal in size, 11.9 cm AP. No focal abnormalities. Adrenals/Urinary Tract: No adrenal nodule. There is no hydronephrosis. Symmetric bilateral perinephric edema. No evidence of focal renal lesion or stone. The urinary bladder is partially distended. No bladder wall thickening. Stomach/Bowel: The stomach is nondistended and not well assessed. There is no small bowel wall thickening or obstruction. Normal appendix visualized. Small to moderate colonic stool burden. Multifocal colonic diverticulosis, most prominent in the sigmoid. There is no definite diverticulitis or acute colonic inflammation. No obvious colonic mass. Vascular/Lymphatic: Retroperitoneal adenopathy was not seen on prior exam. Left periaortic adenopathy, including a 2.1 x 3.6 cm node series 3, image 34. This is contiguous with enlarged and prominent left periaortic nodes extending to the iliac bifurcation. There is mild perinodal edema. There is also a mildly enlarged aortocaval node at 13 mm series 3, image 37. More upper retroperitoneal lymph nodes are prominent measuring up to 9 mm, not discretely enlarged by size criteria. Small porta hepatis nodes, largest 7 mm. There is no pelvic or inguinal adenopathy. Moderate aortic and branch atherosclerosis. No aortic aneurysm. The portal and splenic veins are patent. Reproductive: Small uterine calcification may represent a small fibroid. There is no adnexal mass. Other: No ascites. No free air or focal fluid collection. There is mild stranding of the subcutaneous fat posterior to the left iliac bone, chronic and improved from prior exam. No subcutaneous collection. Musculoskeletal: Diffuse degenerative change throughout the lower thoracic and lumbar spine. Occasional bone islands in the pelvis. IMPRESSION: 1. Retroperitoneal adenopathy, not seen on prior exam, primarily in the left periaortic station where there is a 2.1 x 3.6 cm node.  There is mild perinodal edema. Lymph nodes are larger than typically seen with reactive adenopathy, and are suspicious for lymphoproliferative disorder such as lymphoma. Recommend clinical/laboratory correlation for lymphoproliferative disorder. Consider further evaluation with PET-CT. If PET-CT is not pursued, recommend short interval follow-up CT in 2-3 months. 2. Colonic diverticulosis without acute inflammation. 3. Hepatic steatosis. Cholelithiasis without gallbladder inflammation. Aortic Atherosclerosis (ICD10-I70.0). Electronically Signed   By: MKeith RakeM.D.   On: 12/09/2021 20:02    ASSESSMENT & PLAN JAlvetta Hidrogois a 71y.o. who presents to the diagnostic clinic due to recent CT imaging that showed retroperitoneal lymphadenopathy. We reviewed possible etiologies including infectious process, inflammatory process and lymphoproliferative disorders. Patient will proceed with laboratory evaluation today to check CBC, CMP, LDH, flow cytometry, sedimentation rate and C-reactive protein. Additionally, we recommend CT chest to evaluate for other sites of lymphadenopathy and core needle biopsy of retroperitoneal lymph node for tissue confirmation.  Patient expressed understanding of the diagnostic workup and is in agreement to move forward.   #Retroperitoneal lymphadenopathy: --Labs today to check CBC, CMP, LDH, flow cytometry, sedimentation rate and C-reactive protein.  --CT chest to evaluate for additional sites of lymphadenopathy. --Placed ordered to IR for CT guided biopsy of retroperitoneal lymph node --RTC once workup is complete  No orders of the defined types were placed in this encounter.   All questions were answered. The patient knows to  call the clinic with any problems, questions or concerns.  I have spent a total of 60 minutes minutes of face-to-face and non-face-to-face time, preparing to see the patient, obtaining and/or reviewing separately obtained history, performing a  medically appropriate examination, counseling and educating the patient, ordering tests/procedures, referring and communicating with other health care professionals, documenting clinical information in the electronic health record, independently interpreting results and communicating results to the patient, and care coordination.   Dede Query, PA-C Department of Hematology/Oncology [place] at [facility] Phone: [phone]  Patient was seen with Dr. Lorenso Courier  I have read the above note and personally examined the patient. I agree with the assessment and plan as noted above.  Briefly Mrs. Kandel who presents for evaluation of retroperitoneal lymphadenopathy.  At this time the etiology of the lymphadenopathy is unclear.  Differential would include inflammation, infection, or lymphoproliferative disorder.  We will order inflammatory markers with ESR and CRP as well as LDH.  We will order for an IR guided biopsy of one of the abdominal lymph nodes assuming the CT scan of the chest does not show any more easily accessible lymph nodes.  The patient voiced understanding of our plan moving forward.   Ledell Peoples, MD Department of Hematology/Oncology [place] at [facility] Phone: [phone] Pager: [phone] Email: [email or web address]

## 2021-12-20 ENCOUNTER — Inpatient Hospital Stay: Payer: Medicare Other | Attending: Physician Assistant | Admitting: Physician Assistant

## 2021-12-20 ENCOUNTER — Encounter: Payer: Self-pay | Admitting: Physician Assistant

## 2021-12-20 ENCOUNTER — Inpatient Hospital Stay: Payer: Medicare Other

## 2021-12-20 ENCOUNTER — Other Ambulatory Visit: Payer: Self-pay

## 2021-12-20 VITALS — BP 164/72 | HR 73 | Temp 97.7°F | Resp 15

## 2021-12-20 DIAGNOSIS — R5383 Other fatigue: Secondary | ICD-10-CM | POA: Diagnosis not present

## 2021-12-20 DIAGNOSIS — I10 Essential (primary) hypertension: Secondary | ICD-10-CM | POA: Insufficient documentation

## 2021-12-20 DIAGNOSIS — R590 Localized enlarged lymph nodes: Secondary | ICD-10-CM | POA: Diagnosis not present

## 2021-12-20 DIAGNOSIS — E119 Type 2 diabetes mellitus without complications: Secondary | ICD-10-CM | POA: Insufficient documentation

## 2021-12-20 NOTE — Progress Notes (Signed)
BIOPSY REVIEW Date: 12/20/21  Requested Biopsy site: RP LN  Reason for request: Lymphadenopathy  Imaging review: CT AP, 12/09/21 Recommended imaging modality to perform biopsy: CT Additional comments: L RP LN Bx.  Please contact me with questions, concerns, or if issue pertaining to this request arise.  Michaelle Birks, MD [facility] Radiology  Pager. [place]

## 2021-12-21 ENCOUNTER — Telehealth: Payer: Self-pay | Admitting: Physician Assistant

## 2021-12-21 NOTE — Telephone Encounter (Signed)
Per 11/15 secure chat called and left message for pt about lab appointment

## 2021-12-22 NOTE — Progress Notes (Signed)
Patient for CT guided LT Retroperitoneal LN Biopsy on Tues 12/27/2021, I called [name] in [place] and spoke with transportation and the RN - Hassan Rowan on the phone and gave pre-procedure instructions. They were made aware that the patient is to here at [place] at the new entrance, NPO after MN prior to procedure as well as driver post procedure/recovery/discharge. They stated patient can sign her own consents.  They also stated understanding to all instructions.  Called 12/21/2021

## 2021-12-26 ENCOUNTER — Other Ambulatory Visit (HOSPITAL_COMMUNITY): Payer: Self-pay | Admitting: Student

## 2021-12-26 ENCOUNTER — Inpatient Hospital Stay: Payer: Medicare Other

## 2021-12-26 DIAGNOSIS — R590 Localized enlarged lymph nodes: Secondary | ICD-10-CM

## 2021-12-27 ENCOUNTER — Ambulatory Visit
Admission: RE | Admit: 2021-12-27 | Discharge: 2021-12-27 | Disposition: A | Discharge status: Home / Self Care | Payer: Medicare Other | Source: Ambulatory Visit | Attending: Physician Assistant | Admitting: Physician Assistant

## 2021-12-27 DIAGNOSIS — Z79899 Other long term (current) drug therapy: Secondary | ICD-10-CM | POA: Diagnosis not present

## 2021-12-27 DIAGNOSIS — K7581 Nonalcoholic steatohepatitis (NASH): Secondary | ICD-10-CM | POA: Insufficient documentation

## 2021-12-27 DIAGNOSIS — K219 Gastro-esophageal reflux disease without esophagitis: Secondary | ICD-10-CM | POA: Diagnosis not present

## 2021-12-27 DIAGNOSIS — R590 Localized enlarged lymph nodes: Secondary | ICD-10-CM | POA: Diagnosis not present

## 2021-12-27 DIAGNOSIS — I10 Essential (primary) hypertension: Secondary | ICD-10-CM | POA: Insufficient documentation

## 2021-12-27 DIAGNOSIS — Z7985 Long-term (current) use of injectable non-insulin antidiabetic drugs: Secondary | ICD-10-CM | POA: Diagnosis not present

## 2021-12-27 DIAGNOSIS — F209 Schizophrenia, unspecified: Secondary | ICD-10-CM | POA: Insufficient documentation

## 2021-12-27 DIAGNOSIS — R109 Unspecified abdominal pain: Secondary | ICD-10-CM | POA: Insufficient documentation

## 2021-12-27 DIAGNOSIS — Z794 Long term (current) use of insulin: Secondary | ICD-10-CM | POA: Insufficient documentation

## 2021-12-27 DIAGNOSIS — E119 Type 2 diabetes mellitus without complications: Secondary | ICD-10-CM | POA: Insufficient documentation

## 2021-12-27 DIAGNOSIS — K5730 Diverticulosis of large intestine without perforation or abscess without bleeding: Secondary | ICD-10-CM | POA: Insufficient documentation

## 2021-12-27 DIAGNOSIS — Z8673 Personal history of transient ischemic attack (TIA), and cerebral infarction without residual deficits: Secondary | ICD-10-CM | POA: Insufficient documentation

## 2021-12-27 LAB — GLUCOSE, CAPILLARY: Glucose-Capillary: 158 mg/dL — ABNORMAL HIGH (ref 70–99)

## 2021-12-27 LAB — PROTIME-INR
INR: 1.1 (ref 0.8–1.2)
Prothrombin Time: 13.6 seconds (ref 11.4–15.2)

## 2021-12-27 LAB — CBC
HCT: 42.6 % (ref 36.0–46.0)
Hemoglobin: 13.9 g/dL (ref 12.0–15.0)
MCH: 29.6 pg (ref 26.0–34.0)
MCHC: 32.6 g/dL (ref 30.0–36.0)
MCV: 90.6 fL (ref 80.0–100.0)
Platelets: 145 10*3/uL — ABNORMAL LOW (ref 150–400)
RBC: 4.7 MIL/uL (ref 3.87–5.11)
RDW: 13.2 % (ref 11.5–15.5)
WBC: 8.3 10*3/uL (ref 4.0–10.5)
nRBC: 0 % (ref 0.0–0.2)

## 2021-12-27 MED ORDER — LIDOCAINE HCL (PF) 1 % IJ SOLN
10.0000 mL | Freq: Once | INTRAMUSCULAR | Status: AC
  Administered 2021-12-27: 10 mL
  Filled 2021-12-27: qty 10

## 2021-12-27 MED ORDER — FENTANYL CITRATE (PF) 100 MCG/2ML IJ SOLN
INTRAMUSCULAR | Status: AC | PRN
  Administered 2021-12-27: 50 ug via INTRAVENOUS

## 2021-12-27 MED ORDER — FENTANYL CITRATE (PF) 100 MCG/2ML IJ SOLN
INTRAMUSCULAR | Status: AC
  Filled 2021-12-27: qty 2

## 2021-12-27 MED ORDER — MIDAZOLAM HCL 2 MG/2ML IJ SOLN
INTRAMUSCULAR | Status: AC
  Filled 2021-12-27: qty 2

## 2021-12-27 MED ORDER — MIDAZOLAM HCL 2 MG/2ML IJ SOLN
INTRAMUSCULAR | Status: AC | PRN
  Administered 2021-12-27: 1 mg via INTRAVENOUS

## 2021-12-27 MED ORDER — SODIUM CHLORIDE 0.9 % IV SOLN: INTRAVENOUS | Status: DC

## 2021-12-27 NOTE — Progress Notes (Signed)
Patient clinically stable post CT Abdominal LN biopsy per Dr Anselm Pancoast, tolerated well. Vitals stable pre and post procedure. Received versed 1 mg along with Fentanyl 50 mcg IV for procedure. Report given to Genelle Bal RN post procedure/329.

## 2021-12-27 NOTE — H&P (Signed)
Chief Complaint: Patient was seen in consultation today for retroperitoneal lymphadenopathy  Referring Physician(s): Lincoln Brigham  Supervising Physician: Markus Daft  Patient Status: [facility] - Out-pt  History of Present Illness: Heather Petty is a 71 y.o. female with PMH significant for diabetes mellitus, GERD, hypertension, NASH, schizophrenia, and stroke being seen today for retroperitoneal lymphadenopathy. The patient initially presented to the ED on 12/09/21 with nausea, vomiting, and abdominal pain. CT imaging revealed retroperitoneal lymphadenopathy "primarily in the left periaortic station where there is a 2.1 x 3.6 cm node." The patient was subsequently seen by Hematology and Oncology service and was referred to IR for image-guided biopsy of retroperitoneal lymph node.  Past Medical History:  Diagnosis Date   AKI (acute kidney injury) ([place]) 12/17/2017   Allergic rhinitis    Anemia    Arthritis    Asthma    Autonomic neuropathy    Depression    Diabetes mellitus    Diabetes mellitus without complication (HCC)    E coli infection    GERD (gastroesophageal reflux disease)    GERD (gastroesophageal reflux disease)    Gout    Hepatitis A    High cholesterol    Hypertension    Insomnia    Kidney failure    Metabolic encephalopathy    Morbid obesity (HCC)    NASH (nonalcoholic steatohepatitis)    Pneumonia 12/17/2017   Schizophrenia ([place])    Stroke ([place])    Vitamin B deficiency    Vitamin D deficiency     Past Surgical History:  Procedure Laterality Date   CYST EXCISION     buttucks   DENTAL SURGERY     TONSILLECTOMY      Allergies: Vancomycin, Lasix [furosemide], and Lasix [furosemide]  Medications: Prior to Admission medications   Medication Sig Start Date End Date Taking? Authorizing Provider  allopurinol (ZYLOPRIM) 300 MG tablet Take 300 mg by mouth daily.   Yes [provider]  amLODipine (NORVASC) 10 MG tablet Take 10 mg by mouth daily.    Yes [provider]  carvedilol (COREG) 6.25 MG tablet Take 6.25 mg by mouth 2 (two) times daily with a meal.   Yes [provider]  doxepin (SINEQUAN) 25 MG capsule Take 25 mg by mouth at bedtime.   Yes [provider]  famotidine (PEPCID) 20 MG tablet Take 20 mg by mouth 2 (two) times daily.   Yes [provider]  glipiZIDE (GLUCOTROL XL) 10 MG 24 hr tablet Take 10 mg by mouth 2 (two) times daily.    Yes [provider]  hydrOXYzine (ATARAX/VISTARIL) 25 MG tablet Take 25 mg by mouth in the morning and at bedtime.   Yes [provider]  insulin aspart (NOVOLOG) 100 UNIT/ML injection Inject 0-12 Units into the skin See admin instructions. Inject 0-12 units into the skin 4 times a day (before meals and at bedtime) per sliding scale: BGL <60 or >400  = CALL MD; 60-200 = 0 units; 201-250 = 2 units; 251-300 = 4 units; 301-350 = 6 units; 351-400 = 8 units; 401-450 = 10 units; >450 = 12 units 07/31/19  Yes Annita Brod, MD  insulin glargine (LANTUS) 100 UNIT/ML injection Inject 0.1 mLs (10 Units total) into the skin at bedtime. Patient taking differently: Inject 22 Units into the skin at bedtime. 09/09/19  Yes Pokhrel, [place], MD  lidocaine (LIDODERM) 5 % Place 1 patch onto the skin daily. Remove & Discard patch within 12 hours or as directed by  MD   Yes [provider]  lisinopril (ZESTRIL) 5 MG tablet Take 5 mg by mouth daily.   Yes [provider]  risperiDONE (RISPERDAL) 0.5 MG tablet Take 0.5 mg by mouth daily.   Yes [provider]  Semaglutide,0.25 or 0.5MG/DOS, (OZEMPIC, 0.25 OR 0.5 MG/DOSE,) 2 MG/1.5ML SOPN Inject 0.5 mg into the skin once a week. On Sunday   Yes [provider]  albuterol (VENTOLIN HFA) 108 (90 Base) MCG/ACT inhaler Inhale 2 puffs into the lungs every 4 (four) hours as needed for wheezing or shortness of breath.    [provider]  ammonium lactate (LAC-HYDRIN) 12 % lotion Apply  1 application topically daily as needed for dry skin.    [provider]  Dextromethorphan-guaiFENesin 5-100 MG/5ML LIQD Take 10 mLs by mouth every 6 (six) hours as needed.    [provider]  HYDROcodone-acetaminophen (NORCO/VICODIN) 5-325 MG tablet Take 1 tablet by mouth 2 (two) times daily.    [provider]  loperamide (IMODIUM) 2 MG capsule Take 1 capsule (2 mg total) by mouth as needed for diarrhea or loose stools. 09/09/19   Pokhrel, Corrie Mckusick, MD  Polyethyl Glycol-Propyl Glycol (SYSTANE ULTRA) 0.4-0.3 % SOLN Place 2 drops into both eyes in the morning and at bedtime.    [provider]  polyethylene glycol (MIRALAX / GLYCOLAX) 17 g packet Take 17 g by mouth daily as needed for mild constipation.    [provider]  triamcinolone (NASACORT ALLERGY 24HR) 55 MCG/ACT AERO nasal inhaler Place 1 spray into the nose daily.    [provider]     Family History  Adopted: Yes  Problem Relation Age of Onset   COPD Daughter     Social History   Socioeconomic History   Marital status: Unknown    Spouse name: Not on file   Number of children: 2   Years of education: Not on file   Highest education level: Not on file  Occupational History   Occupation: retired/disabled  Tobacco Use   Smoking status: Never   Smokeless tobacco: Never  Vaping Use   Vaping Use: Never used  Substance and Sexual Activity   Alcohol use: Never   Drug use: Never   Sexual activity: Not on file  Other Topics Concern   Not on file  Social History Narrative   ** Merged History Encounter **       Social Determinants of Health   Financial Resource Strain: Not on file  Food Insecurity: Not on file  Transportation Needs: Not on file  Physical Activity: Not on file  Stress: Not on file  Social Connections: Not on file    Review of Systems: A 12 point ROS discussed and pertinent positives are indicated in the HPI above.  All other systems are  negative.  Review of Systems  Constitutional:  Positive for fatigue. Negative for chills and fever.  Respiratory:  Negative for chest tightness and shortness of breath.   Cardiovascular:  Positive for leg swelling. Negative for chest pain.  Gastrointestinal:  Positive for abdominal pain and diarrhea. Negative for nausea and vomiting.  Neurological:  Negative for dizziness and headaches.  Psychiatric/Behavioral:  Negative for confusion.     Vital Signs: BP (!) 158/84   Pulse 73   Temp 98.3 F (36.8 C) (Oral)   Resp 14   Ht 5' 1"  (1.549 m)   Wt 226 lb (102.5 kg)   SpO2 97%   BMI 42.70 kg/m  Physical Exam Vitals reviewed.  Constitutional:      General: She is not in acute distress.    Appearance: She is obese.  HENT:     Mouth/Throat:     Mouth: Mucous membranes are moist.  Cardiovascular:     Rate and Rhythm: Normal rate and regular rhythm.     Pulses: Normal pulses.     Heart sounds: Normal heart sounds.  Pulmonary:     Effort: Pulmonary effort is normal.     Breath sounds: Normal breath sounds.  Abdominal:     General: Bowel sounds are normal.     Palpations: Abdomen is soft.     Tenderness: There is no abdominal tenderness.  Musculoskeletal:     Right lower leg: Edema present.     Left lower leg: Edema present.  Neurological:     Mental Status: She is alert and oriented to person, place, and time.  Psychiatric:        Mood and Affect: Mood normal.        Behavior: Behavior normal.     Imaging: CT ABDOMEN PELVIS W CONTRAST  Result Date: 12/09/2021 CLINICAL DATA:  Acute abdominal pain. Nausea, vomiting, diarrhea. EXAM: CT ABDOMEN AND PELVIS WITH CONTRAST TECHNIQUE: Multidetector CT imaging of the abdomen and pelvis was performed using the standard protocol following bolus administration of intravenous contrast. RADIATION DOSE REDUCTION: This exam was performed according to the departmental dose-optimization program which includes automated exposure control,  adjustment of the mA and/or kV according to patient size and/or use of iterative reconstruction technique. CONTRAST:  62m OMNIPAQUE IOHEXOL 350 MG/ML SOLN COMPARISON:  CT 07/21/2019 FINDINGS: Lower chest: Clear lung bases. Hepatobiliary: Diffusely decreased hepatic density typical of steatosis. There is no focal hepatic lesion. Small calcified stone in layering sludge in the gallbladder. No pericholecystic fat stranding or inflammation. Common bile duct measures 7-8 mm which is likely normal for age. No visualized choledocholithiasis. Pancreas: Fatty atrophy.  No ductal dilatation or inflammation. Spleen: Upper normal in size, 11.9 cm AP. No focal abnormalities. Adrenals/Urinary Tract: No adrenal nodule. There is no hydronephrosis. Symmetric bilateral perinephric edema. No evidence of focal renal lesion or stone. The urinary bladder is partially distended. No bladder wall thickening. Stomach/Bowel: The stomach is nondistended and not well assessed. There is no small bowel wall thickening or obstruction. Normal appendix visualized. Small to moderate colonic stool burden. Multifocal colonic diverticulosis, most prominent in the sigmoid. There is no definite diverticulitis or acute colonic inflammation. No obvious colonic mass. Vascular/Lymphatic: Retroperitoneal adenopathy was not seen on prior exam. Left periaortic adenopathy, including a 2.1 x 3.6 cm node series 3, image 34. This is contiguous with enlarged and prominent left periaortic nodes extending to the iliac bifurcation. There is mild perinodal edema. There is also a mildly enlarged aortocaval node at 13 mm series 3, image 37. More upper retroperitoneal lymph nodes are prominent measuring up to 9 mm, not discretely enlarged by size criteria. Small porta hepatis nodes, largest 7 mm. There is no pelvic or inguinal adenopathy. Moderate aortic and branch atherosclerosis. No aortic aneurysm. The portal and splenic veins are patent. Reproductive: Small uterine  calcification may represent a small fibroid. There is no adnexal mass. Other: No ascites. No free air or focal fluid collection. There is mild stranding of the subcutaneous fat posterior to the left iliac bone, chronic and improved from prior exam. No subcutaneous collection. Musculoskeletal: Diffuse degenerative change throughout the lower thoracic and lumbar spine. Occasional bone islands in the pelvis. IMPRESSION:  1. Retroperitoneal adenopathy, not seen on prior exam, primarily in the left periaortic station where there is a 2.1 x 3.6 cm node. There is mild perinodal edema. Lymph nodes are larger than typically seen with reactive adenopathy, and are suspicious for lymphoproliferative disorder such as lymphoma. Recommend clinical/laboratory correlation for lymphoproliferative disorder. Consider further evaluation with PET-CT. If PET-CT is not pursued, recommend short interval follow-up CT in 2-3 months. 2. Colonic diverticulosis without acute inflammation. 3. Hepatic steatosis. Cholelithiasis without gallbladder inflammation. Aortic Atherosclerosis (ICD10-I70.0). Electronically Signed   By: Keith Rake M.D.   On: 12/09/2021 20:02    Labs:  CBC: Recent Labs    12/09/21 1723  WBC 8.5  HGB 12.7  HCT 38.3  PLT 148*    COAGS: No results for input(s): "INR", "APTT" in the last 8760 hours.  BMP: Recent Labs    12/09/21 1723  NA 139  K 3.4*  CL 109  CO2 24  GLUCOSE 156*  BUN 12  CALCIUM 8.7*  CREATININE 0.78  GFRNONAA >60    LIVER FUNCTION TESTS: Recent Labs    12/09/21 1723  BILITOT 0.6  AST 14*  ALT 11  ALKPHOS 128*  PROT 6.2*  ALBUMIN 3.3*    TUMOR MARKERS: No results for input(s): "AFPTM", "CEA", "CA199", "CHROMGRNA" in the last 8760 hours.  Assessment and Plan:  Heather Petty is a 71 y.o. female with PMH significant for diabetes mellitus, GERD, hypertension, NASH, schizophrenia, and stroke being seen today for retroperitoneal lymphadenopathy. The patient's case  has been reviewed and approved by Dr Maryelizabeth Kaufmann for left retroperitoneal lymph node biopsy on 12/27/21 with Dr Anselm Pancoast.   Risks and benefits of image-guided left retroperitoneal biopsy was discussed with the patient and/or patient's family including, but not limited to bleeding, infection, damage to adjacent structures or low yield requiring additional tests.  All of the questions were answered and there is agreement to proceed.  Consent signed and in chart.   Thank you for this interesting consult.  I greatly enjoyed meeting Heather Petty and look forward to participating in their care.  A copy of this report was sent to the requesting provider on this date.  Electronically Signed: Lura Em, PA-C 12/27/2021, 9:49 AM   I spent a total of  30 Minutes   in face to face in clinical consultation, greater than 50% of which was counseling/coordinating care for retroperitoneal lymphadenopathy.

## 2021-12-27 NOTE — Procedures (Signed)
Interventional Radiology Procedure:   Indications: Enlarged abdominal lymph nodes  Procedure: CT guided biopsy of enlarged left retroperitoneal lymph node  Findings: 3 cores from enlarged left periaortic lymph node  Complications: No immediate complications noted.     EBL: Minimal  Plan: Bedrest 2 hours, then discharge to home.    Journey Ratterman R. Anselm Pancoast, MD  Pager: [phone]

## 2021-12-30 ENCOUNTER — Encounter: Payer: Self-pay | Admitting: Physician Assistant

## 2021-12-30 LAB — SURGICAL PATHOLOGY

## 2022-01-02 ENCOUNTER — Telehealth: Payer: Self-pay

## 2022-01-02 ENCOUNTER — Telehealth: Payer: Self-pay | Admitting: Hematology and Oncology

## 2022-01-02 ENCOUNTER — Telehealth: Payer: Self-pay | Admitting: Physician Assistant

## 2022-01-02 DIAGNOSIS — I10 Essential (primary) hypertension: Secondary | ICD-10-CM

## 2022-01-02 DIAGNOSIS — C8173 Other classical Hodgkin lymphoma, intra-abdominal lymph nodes: Secondary | ICD-10-CM

## 2022-01-02 NOTE — Telephone Encounter (Signed)
Per 11/27 secure chat called and spoke to pt about appointment and called facility too

## 2022-01-02 NOTE — Telephone Encounter (Signed)
I spoke to [name] to review the biopsy results from 12/27/2021. Findings confirmed Hodgkin's Lymphoma. Patient will follow up with Dr. Lorenso Courier on 01/04/2022 to discuss treatment recommendations. We will order PET scan to complete staging along with echo and port for treatment purposes. Patient expressed understanding of the plan provided.

## 2022-01-02 NOTE — Telephone Encounter (Signed)
Called and informed patient of upcoming appointments and instructions for each appointment. Patient verbalized understanding of this and all questions were answered.  Awaiting call from patient's facility to schedule transportation for these appointments.

## 2022-01-03 ENCOUNTER — Telehealth: Payer: Self-pay

## 2022-01-03 NOTE — Telephone Encounter (Signed)
S/W DIANE @ [place] to confirm all appointments through 01/10/22.

## 2022-01-04 ENCOUNTER — Inpatient Hospital Stay (HOSPITAL_BASED_OUTPATIENT_CLINIC_OR_DEPARTMENT_OTHER): Payer: Medicare Other | Admitting: Hematology and Oncology

## 2022-01-04 ENCOUNTER — Other Ambulatory Visit: Payer: Self-pay | Admitting: Hematology and Oncology

## 2022-01-04 ENCOUNTER — Other Ambulatory Visit: Payer: Self-pay

## 2022-01-04 ENCOUNTER — Inpatient Hospital Stay: Payer: Medicare Other

## 2022-01-04 VITALS — BP 170/71 | HR 64 | Temp 98.2°F | Resp 16 | Wt 221.3 lb

## 2022-01-04 DIAGNOSIS — C8198 Hodgkin lymphoma, unspecified, lymph nodes of multiple sites: Secondary | ICD-10-CM

## 2022-01-04 DIAGNOSIS — C8173 Other classical Hodgkin lymphoma, intra-abdominal lymph nodes: Secondary | ICD-10-CM

## 2022-01-04 DIAGNOSIS — R590 Localized enlarged lymph nodes: Secondary | ICD-10-CM | POA: Diagnosis not present

## 2022-01-04 LAB — CBC WITH DIFFERENTIAL (CANCER CENTER ONLY)
Abs Immature Granulocytes: 0.03 10*3/uL (ref 0.00–0.07)
Basophils Absolute: 0 10*3/uL (ref 0.0–0.1)
Basophils Relative: 0 %
Eosinophils Absolute: 0.2 10*3/uL (ref 0.0–0.5)
Eosinophils Relative: 2 %
HCT: 39.5 % (ref 36.0–46.0)
Hemoglobin: 13.1 g/dL (ref 12.0–15.0)
Immature Granulocytes: 0 %
Lymphocytes Relative: 17 %
Lymphs Abs: 1.8 10*3/uL (ref 0.7–4.0)
MCH: 30.4 pg (ref 26.0–34.0)
MCHC: 33.2 g/dL (ref 30.0–36.0)
MCV: 91.6 fL (ref 80.0–100.0)
Monocytes Absolute: 0.5 10*3/uL (ref 0.1–1.0)
Monocytes Relative: 5 %
Neutro Abs: 7.8 10*3/uL — ABNORMAL HIGH (ref 1.7–7.7)
Neutrophils Relative %: 76 %
Platelet Count: 172 10*3/uL (ref 150–400)
RBC: 4.31 MIL/uL (ref 3.87–5.11)
RDW: 13.2 % (ref 11.5–15.5)
WBC Count: 10.3 10*3/uL (ref 4.0–10.5)
nRBC: 0 % (ref 0.0–0.2)

## 2022-01-04 LAB — CMP (CANCER CENTER ONLY)
ALT: 15 U/L (ref 0–44)
AST: 16 U/L (ref 15–41)
Albumin: 4 g/dL (ref 3.5–5.0)
Alkaline Phosphatase: 131 U/L — ABNORMAL HIGH (ref 38–126)
Anion gap: 7 (ref 5–15)
BUN: 13 mg/dL (ref 8–23)
CO2: 26 mmol/L (ref 22–32)
Calcium: 9.3 mg/dL (ref 8.9–10.3)
Chloride: 108 mmol/L (ref 98–111)
Creatinine: 0.77 mg/dL (ref 0.44–1.00)
GFR, Estimated: >60 mL/min (ref >60)
Glucose, Bld: 83 mg/dL (ref 70–99)
Potassium: 3.6 mmol/L (ref 3.5–5.1)
Sodium: 141 mmol/L (ref 135–145)
Total Bilirubin: 0.5 mg/dL (ref 0.3–1.2)
Total Protein: 7.4 g/dL (ref 6.5–8.1)

## 2022-01-04 LAB — HEPATITIS B SURFACE ANTIGEN: Hepatitis B Surface Ag: NONREACTIVE

## 2022-01-04 LAB — HEPATITIS B SURFACE ANTIBODY,QUALITATIVE: Hep B S Ab: NONREACTIVE

## 2022-01-04 LAB — HEPATITIS B CORE ANTIBODY, TOTAL: Hep B Core Total Ab: NONREACTIVE

## 2022-01-04 LAB — HEPATITIS C ANTIBODY: HCV Ab: NONREACTIVE

## 2022-01-04 LAB — SEDIMENTATION RATE: Sed Rate: 44 mm/hr — ABNORMAL HIGH (ref 0–22)

## 2022-01-04 NOTE — Progress Notes (Signed)
[name] [phone]   [phone]  PROGRESS NOTE  Patient Care Team: Pcp, No as PCP - General [name], MD (Internal Medicine)  Hematological/Oncological History # Hodkgin's Lymphoma, Stage III 12/09/2021: CT Abdomen/Pelvis showed retroperitoneal adenopathy, with 2.1 x 3.6 cm node 12/20/2021: establish care with [facility]  12/27/2021: CT abdominal mass biopsy performed, findings consistent with classical Hodgkin's lymphoma. 01/05/2022: PET CT scan showed hypermetabolic abdominal adenopathy consistent with lymphoma.  She has mild hypermetabolism involving the mediastinal and bilateral axillary nodes.  Interval History:  Heather Petty 71 y.o. female with medical history significant for newly diagnosed Hodgkin's lymphoma who presents for a follow up visit. The patient's last visit was on 12/20/2021. In the interim since the last visit she underwent a biopsy which confirms the diagnosis of Hodgkin's lymphoma.   On exam today Heather Petty is accompanied by her daughter.  She reports that she has been stable in the interim since her last visit.  She is not having any difficulties with fevers, chills, sweats, nausea, vomiting or diarrhea.  She does not have any enlarged adenopathy.  She continues to eat a normal diet.  Overall she is at her baseline level of health.  A full 10 point ROS is otherwise negative.  The bulk of our discussion focused on the results of the PET CT scan and the steps moving forward.  We discussed AVD chemotherapy and the need for port placement, and chemotherapy education.  She voiced understanding of the plan moving forward.  MEDICAL HISTORY:  Past Medical History:  Diagnosis Date   AKI (acute kidney injury) ([place]) 12/17/2017   Allergic rhinitis    Anemia    Arthritis    Asthma    Autonomic neuropathy    Depression    Diabetes mellitus    Diabetes mellitus without complication (HCC)    E coli infection    GERD  (gastroesophageal reflux disease)    GERD (gastroesophageal reflux disease)    Gout    Hepatitis A    High cholesterol    Hypertension    Insomnia    Kidney failure    Metabolic encephalopathy    Morbid obesity (HCC)    NASH (nonalcoholic steatohepatitis)    Pneumonia 12/17/2017   Schizophrenia ([place])    Stroke ([place])    Vitamin B deficiency    Vitamin D deficiency     SURGICAL HISTORY: Past Surgical History:  Procedure Laterality Date   CYST EXCISION     buttucks   DENTAL SURGERY     IR IMAGING GUIDED PORT INSERTION  01/10/2022   TONSILLECTOMY      SOCIAL HISTORY: Social History   Socioeconomic History   Marital status: Unknown    Spouse name: Not on file   Number of children: 2   Years of education: Not on file   Highest education level: Not on file  Occupational History   Occupation: retired/disabled  Tobacco Use   Smoking status: Never   Smokeless tobacco: Never  Vaping Use   Vaping Use: Never used  Substance and Sexual Activity   Alcohol use: Never   Drug use: Never   Sexual activity: Not on file  Other Topics Concern   Not on file  Social History Narrative   ** Merged History Encounter **       Social Determinants of Health   Financial Resource Strain: Not on file  Food Insecurity: Not on file  Transportation Needs: Not on file  Physical Activity: Not  on file  Stress: Not on file  Social Connections: Not on file  Intimate Partner Violence: Not on file    FAMILY HISTORY: Family History  Adopted: Yes  Problem Relation Age of Onset   COPD Daughter     ALLERGIES:  is allergic to vancomycin, lasix [furosemide], and lasix [furosemide].  MEDICATIONS:  Current Outpatient Medications  Medication Sig Dispense Refill   albuterol (VENTOLIN HFA) 108 (90 Base) MCG/ACT inhaler Inhale 2 puffs into the lungs every 4 (four) hours as needed for wheezing or shortness of breath.     allopurinol (ZYLOPRIM) 300 MG tablet Take 300 mg by mouth daily.      amLODipine (NORVASC) 10 MG tablet Take 10 mg by mouth daily.     ammonium lactate (LAC-HYDRIN) 12 % lotion Apply 1 application topically daily as needed for dry skin. (Patient not taking: Reported on 01/04/2022)     carvedilol (COREG) 6.25 MG tablet Take 6.25 mg by mouth 2 (two) times daily with a meal.     Dextromethorphan-guaiFENesin 5-100 MG/5ML LIQD Take 10 mLs by mouth every 6 (six) hours as needed.     doxepin (SINEQUAN) 25 MG capsule Take 25 mg by mouth at bedtime.     famotidine (PEPCID) 20 MG tablet Take 20 mg by mouth 2 (two) times daily.     glipiZIDE (GLUCOTROL XL) 10 MG 24 hr tablet Take 10 mg by mouth 2 (two) times daily.      HYDROcodone-acetaminophen (NORCO/VICODIN) 5-325 MG tablet Take 1 tablet by mouth 2 (two) times daily.     hydrOXYzine (ATARAX/VISTARIL) 25 MG tablet Take 25 mg by mouth in the morning and at bedtime.     insulin aspart (NOVOLOG) 100 UNIT/ML injection Inject 0-12 Units into the skin See admin instructions. Inject 0-12 units into the skin 4 times a day (before meals and at bedtime) per sliding scale: BGL <60 or >400  = CALL MD; 60-200 = 0 units; 201-250 = 2 units; 251-300 = 4 units; 301-350 = 6 units; 351-400 = 8 units; 401-450 = 10 units; >450 = 12 units     insulin glargine (LANTUS) 100 UNIT/ML injection Inject 0.1 mLs (10 Units total) into the skin at bedtime. (Patient taking differently: Inject 22 Units into the skin at bedtime.)     lidocaine (LIDODERM) 5 % Place 1 patch onto the skin daily. Remove & Discard patch within 12 hours or as directed by MD     lidocaine-prilocaine (EMLA) cream Apply 1 Application topically as needed. 30 g 0   lisinopril (ZESTRIL) 5 MG tablet Take 5 mg by mouth daily.     loperamide (IMODIUM) 2 MG capsule Take 1 capsule (2 mg total) by mouth as needed for diarrhea or loose stools. 30 capsule 0   Polyethyl Glycol-Propyl Glycol (SYSTANE ULTRA) 0.4-0.3 % SOLN Place 2 drops into both eyes in the morning and at bedtime.     polyethylene  glycol (MIRALAX / GLYCOLAX) 17 g packet Take 17 g by mouth daily as needed for mild constipation.     prochlorperazine (COMPAZINE) 10 MG tablet Take 1 tablet (10 mg total) by mouth every 6 (six) hours as needed for nausea or vomiting. 30 tablet 0   risperiDONE (RISPERDAL) 0.5 MG tablet Take 0.5 mg by mouth daily.     Semaglutide,0.25 or 0.5MG/DOS, (OZEMPIC, 0.25 OR 0.5 MG/DOSE,) 2 MG/1.5ML SOPN Inject 0.5 mg into the skin once a week. On Sunday (Patient not taking: Reported on 01/04/2022)     triamcinolone (NASACORT  ALLERGY 24HR) 55 MCG/ACT AERO nasal inhaler Place 1 spray into the nose daily.     No current facility-administered medications for this visit.   Facility-Administered Medications Ordered in Other Visits  Medication Dose Route Frequency Provider Last Rate Last Admin   0.9 %  sodium chloride infusion   Intravenous Continuous Allred, Darrell K, PA-C   Stopped at 01/10/22 1715   fentaNYL (SUBLIMAZE) 100 MCG/2ML injection            midazolam (VERSED) 2 MG/2ML injection             REVIEW OF SYSTEMS:   Constitutional: ( - ) fevers, ( - )  chills , ( - ) night sweats Eyes: ( - ) blurriness of vision, ( - ) double vision, ( - ) watery eyes Ears, nose, mouth, throat, and face: ( - ) mucositis, ( - ) sore throat Respiratory: ( - ) cough, ( - ) dyspnea, ( - ) wheezes Cardiovascular: ( - ) palpitation, ( - ) chest discomfort, ( - ) lower extremity swelling Gastrointestinal:  ( - ) nausea, ( - ) heartburn, ( - ) change in bowel habits Skin: ( - ) abnormal skin rashes Lymphatics: ( - ) new lymphadenopathy, ( - ) easy bruising Neurological: ( - ) numbness, ( - ) tingling, ( - ) new weaknesses Behavioral/Psych: ( - ) mood change, ( - ) new changes  All other systems were reviewed with the patient and are negative.  PHYSICAL EXAMINATION: ECOG PERFORMANCE STATUS: 2 - Symptomatic, <50% confined to bed  Vitals:   01/04/22 1540  BP: (!) 170/71  Pulse: 64  Resp: 16  Temp: 98.2 F (36.8  C)  SpO2: 99%   Filed Weights   01/04/22 1540  Weight: 221 lb 4.8 oz (100.4 kg)    GENERAL: Well-appearing elderly female, alert, no distress and comfortable SKIN: skin color, texture, turgor are normal, no rashes or significant lesions EYES: conjunctiva are pink and non-injected, sclera clear LUNGS: clear to auscultation and percussion with normal breathing effort HEART: regular rate & rhythm and no murmurs and no lower extremity edema Musculoskeletal: no cyanosis of digits and no clubbing  PSYCH: alert & oriented x 3, fluent speech NEURO: no focal motor/sensory deficits  LABORATORY DATA:  I have reviewed the data as listed    Latest Ref Rng & Units 01/04/2022    2:56 PM 12/27/2021    9:32 AM 12/09/2021    5:23 PM  CBC  WBC 4.0 - 10.5 K/uL 10.3  8.3  8.5   Hemoglobin 12.0 - 15.0 g/dL 13.1  13.9  12.7   Hematocrit 36.0 - 46.0 % 39.5  42.6  38.3   Platelets 150 - 400 K/uL 172  145  148        Latest Ref Rng & Units 01/04/2022    2:56 PM 12/09/2021    5:23 PM 09/08/2019   12:50 AM  CMP  Glucose 70 - 99 mg/dL 83  156  86   BUN 8 - 23 mg/dL _0 Creatinine 0.44 - 1.00 mg/dL 0.77  0.78  0.79   Sodium 135 - 145 mmol/L 141  139  140   Potassium 3.5 - 5.1 mmol/L 3.6  3.4  3.7   Chloride 98 - 111 mmol/L 108  109  110   CO2 22 - 32 mmol/L _1 Calcium 8.9 - 10.3 mg/dL 9.3  8.7  8.7  Total Protein 6.5 - 8.1 g/dL 7.4  6.2    Total Bilirubin 0.3 - 1.2 mg/dL 0.5  0.6    Alkaline Phos 38 - 126 U/L 131  128    AST 15 - 41 U/L 16  14    ALT 0 - 44 U/L 15  11      RADIOGRAPHIC STUDIES: IR IMAGING GUIDED PORT INSERTION  Result Date: 01/10/2022 CLINICAL DATA:  Classical Hodgkin's lymphoma and need for porta cath for chemotherapy. EXAM: IMPLANTED PORT A CATH PLACEMENT WITH ULTRASOUND AND FLUOROSCOPIC GUIDANCE ANESTHESIA/SEDATION: Moderate (conscious) sedation was employed during this procedure. A total of Versed 2.0 mg and Fentanyl 50 mcg was administered  intravenously. Moderate Sedation Time: 31 minutes. The patient's level of consciousness and vital signs were monitored continuously by radiology nursing throughout the procedure under my direct supervision. FLUOROSCOPY: 30 seconds.  2.0 mGy. PROCEDURE: The procedure, risks, benefits, and alternatives were explained to the patient. Questions regarding the procedure were encouraged and answered. The patient understands and consents to the procedure. A time-out was performed prior to initiating the procedure. Ultrasound was utilized to confirm patency of the right internal jugular vein. A permanent ultrasound image was saved and recorded. The right neck and chest were prepped with chlorhexidine in a sterile fashion, and a sterile drape was applied covering the operative field. Maximum barrier sterile technique with sterile gowns and gloves were used for the procedure. Local anesthesia was provided with 1% lidocaine. After creating a small venotomy incision, a 21 gauge needle was advanced into the right internal jugular vein under direct, real-time ultrasound guidance. Ultrasound image documentation was performed. After securing guidewire access, an 8 Fr dilator was placed. A J-wire was kinked to measure appropriate catheter length. A subcutaneous port pocket was then created along the upper chest wall utilizing sharp and blunt dissection. Portable cautery was utilized. The pocket was irrigated with sterile saline. A single lumen power injectable port was chosen for placement. The 8 Fr catheter was tunneled from the port pocket site to the venotomy incision. The port was placed in the pocket. External catheter was trimmed to appropriate length based on guidewire measurement. At the venotomy, an 8 Fr peel-away sheath was placed over a guidewire. The catheter was then placed through the sheath and the sheath removed. Final catheter positioning was confirmed and documented with a fluoroscopic spot image. The port was  accessed with a needle and aspirated and flushed with heparinized saline. The access needle was removed. The venotomy and port pocket incisions were closed with subcutaneous 3-0 Monocryl and subcuticular 4-0 Vicryl. Dermabond was applied to both incisions. COMPLICATIONS: COMPLICATIONS None FINDINGS: After catheter placement, the tip lies at the cavo-atrial junction. The catheter aspirates normally and is ready for immediate use. IMPRESSION: Placement of single lumen port a cath via right internal jugular vein. The catheter tip lies at the cavo-atrial junction. A power injectable port a cath was placed and is ready for immediate use. Electronically Signed   By: Aletta Edouard M.D.   On: 01/10/2022 16:37   ECHOCARDIOGRAM COMPLETE  Result Date: 01/08/2022    ECHOCARDIOGRAM REPORT   Patient Name:   Heather Petty Date of Exam: 01/06/2022 Medical Rec #:  [id number]        Height:       61.0 in Accession #:    [id number]       Weight:       221.3 lb Date of Birth:  January 23, 1951  BSA:          1.972 m Patient Age:    71 years         BP:           135/74 mmHg Patient Gender: F                HR:           65 bpm. Exam Location:  Outpatient Procedure: 2D Echo and Strain Analysis Indications:    Chemo  History:        Patient has prior history of Echocardiogram examinations, most                 recent 09/04/2019. Risk Factors:Hypertension.  Sonographer:    Harvie Junior Referring Phys: [id number] IRENE T THAYIL  Sonographer Comments: Technically difficult study due to poor echo windows and patient is obese. Image acquisition challenging due to patient body habitus. Global longitudinal strain was attempted. IMPRESSIONS  1. Left ventricular ejection fraction, by estimation, is 60 to 65%. The left ventricle has normal function. The left ventricle has no regional wall motion abnormalities. Left ventricular diastolic parameters are consistent with Grade I diastolic dysfunction (impaired relaxation). The average left  ventricular global longitudinal strain is -19.1 %. The global longitudinal strain is normal.  2. Right ventricular systolic function is normal. The right ventricular size is normal. There is normal pulmonary artery systolic pressure.  3. The mitral valve is normal in structure. No evidence of mitral valve regurgitation. No evidence of mitral stenosis.  4. The aortic valve was not well visualized. Aortic valve regurgitation is not visualized. No aortic stenosis is present.  5. The inferior vena cava is normal in size with greater than 50% respiratory variability, suggesting right atrial pressure of 3 mmHg. FINDINGS  Left Ventricle: Left ventricular ejection fraction, by estimation, is 60 to 65%. The left ventricle has normal function. The left ventricle has no regional wall motion abnormalities. The average left ventricular global longitudinal strain is -19.1 %. The global longitudinal strain is normal. The left ventricular internal cavity size was normal in size. There is no left ventricular hypertrophy. Left ventricular diastolic parameters are consistent with Grade I diastolic dysfunction (impaired relaxation). Indeterminate filling pressures. Right Ventricle: The right ventricular size is normal. No increase in right ventricular wall thickness. Right ventricular systolic function is normal. There is normal pulmonary artery systolic pressure. The tricuspid regurgitant velocity is 2.54 m/s, and  with an assumed right atrial pressure of 3 mmHg, the estimated right ventricular systolic pressure is 45.4 mmHg. Left Atrium: Left atrial size was normal in size. Right Atrium: Right atrial size was normal in size. Pericardium: There is no evidence of pericardial effusion. Mitral Valve: The mitral valve is normal in structure. No evidence of mitral valve regurgitation. No evidence of mitral valve stenosis. Tricuspid Valve: The tricuspid valve is normal in structure. Tricuspid valve regurgitation is trivial. No evidence of  tricuspid stenosis. Aortic Valve: The aortic valve was not well visualized. Aortic valve regurgitation is not visualized. No aortic stenosis is present. Aortic valve mean gradient measures 3.0 mmHg. Aortic valve peak gradient measures 5.9 mmHg. Aortic valve area, by VTI measures 2.51 cm. Pulmonic Valve: The pulmonic valve was normal in structure. Pulmonic valve regurgitation is not visualized. No evidence of pulmonic stenosis. Aorta: The aortic root is normal in size and structure. Venous: The inferior vena cava is normal in size with greater than 50% respiratory variability, suggesting right atrial pressure of 3 mmHg. IAS/Shunts:  No atrial level shunt detected by color flow Doppler.  LEFT VENTRICLE PLAX 2D LVIDd:         4.50 cm     Diastology LVIDs:         2.90 cm     LV e' medial:    6.74 cm/s LV PW:         0.90 cm     LV E/e' medial:  13.9 LV IVS:        0.90 cm     LV e' lateral:   11.70 cm/s LVOT diam:     1.90 cm     LV E/e' lateral: 8.0 LV SV:         66 LV SV Index:   34          2D Longitudinal Strain LVOT Area:     2.84 cm    2D Strain GLS (A2C):   -18.9 %                            2D Strain GLS (A3C):   -19.9 %                            2D Strain GLS (A4C):   -18.5 % LV Volumes (MOD)           2D Strain GLS Avg:     -19.1 % LV vol d, MOD A2C: 62.8 ml LV vol d, MOD A4C: 74.7 ml LV vol s, MOD A2C: 24.0 ml LV vol s, MOD A4C: 28.2 ml 3D Volume EF: LV SV MOD A2C:     38.8 ml 3D EF:        73 % LV SV MOD A4C:     74.7 ml LV EDV:       205 ml LV SV MOD BP:      41.9 ml LV ESV:       55 ml                            LV SV:        150 ml RIGHT VENTRICLE RV Basal diam:  3.20 cm RV Mid diam:    3.00 cm RV S prime:     12.90 cm/s TAPSE (M-mode): 1.7 cm LEFT ATRIUM           Index        RIGHT ATRIUM          Index LA diam:      2.70 cm 1.37 cm/m   RA Area:     8.79 cm LA Vol (A2C): 31.5 ml 15.98 ml/m  RA Volume:   15.60 ml 7.91 ml/m LA Vol (A4C): 26.5 ml 13.44 ml/m  AORTIC VALVE                     PULMONIC VALVE AV Area (Vmax):    2.51 cm     PV Vmax:       1.10 m/s AV Area (Vmean):   2.54 cm     PV Peak grad:  4.8 mmHg AV Area (VTI):     2.51 cm AV Vmax:           121.00 cm/s AV Vmean:          79.300 cm/s AV VTI:  0.264 m AV Peak Grad:      5.9 mmHg AV Mean Grad:      3.0 mmHg LVOT Vmax:         107.00 cm/s LVOT Vmean:        71.000 cm/s LVOT VTI:          0.234 m LVOT/AV VTI ratio: 0.89  AORTA Ao Root diam: 2.60 cm Ao Asc diam:  2.70 cm MITRAL VALVE                TRICUSPID VALVE MV Area (PHT): 3.31 cm     TR Peak grad:   25.8 mmHg MV Decel Time: 229 msec     TR Vmax:        254.00 cm/s MR Peak grad: 47.1 mmHg MR Vmax:      343.00 cm/s   SHUNTS MV E velocity: 93.80 cm/s   Systemic VTI:  0.23 m MV A velocity: 108.00 cm/s  Systemic Diam: 1.90 cm MV E/A ratio:  0.87 Skeet Latch MD Electronically signed by Skeet Latch MD Signature Date/Time: 01/08/2022/5:24:10 PM    Final    NM PET Image Initial (PI) Skull Base To Thigh  Result Date: 01/06/2022 CLINICAL DATA:  Initial treatment strategy for new diagnosis of Hodgkin's lymphoma. EXAM: NUCLEAR MEDICINE PET SKULL BASE TO THIGH TECHNIQUE: 10.9 mCi F-18 FDG was injected intravenously. Full-ring PET imaging was performed from the skull base to thigh after the radiotracer. CT data was obtained and used for attenuation correction and anatomic localization. Fasting blood glucose: 144 mg/dl COMPARISON:  12/09/2021 abdominopelvic CT. Most recent chest CT 07/21/2019 FINDINGS: Mild limitations secondary to patient body habitus. Mediastinal blood pool activity: SUV max 3.6 Liver activity: SUV max 4.9 NECK: No areas of abnormal hypermetabolism. Incidental CT findings: No cervical adenopathy. Bilateral carotid atherosclerosis. CHEST: No pulmonary parenchymal hypermetabolism. Bilateral small axillary nodes with low-level nonspecific hypermetabolism. Example left axillary node at 8 mm and a S.U.V. max of 2.5 on 72/4. An AP window node measures 7 mm and  a S.U.V. max of 3.4 on 64/4. Incidental CT findings: Mild cardiomegaly. Aortic and coronary artery calcification. Right lower lobe pulmonary nodules on the order of 3-4 mm, including on 75/4, were present in 2021 and considered benign. ABDOMEN/PELVIS: A portacaval node measures 2.4 x 1.7 cm and a S.U.V. max of 6.4 on 110/4. Hypermetabolism corresponding to the abdominal retroperitoneal bulky adenopathy. Index nodal mass measures 3.9 x 2.7 cm and a S.U.V. max of 10.5 on 121/4. No pelvic nodal hypermetabolism.  No splenic hypermetabolism. Sigmoid colonic hypermetabolism corresponds to extensive diverticulosis and possible diverticulitis. Example at a S.U.V. max of 8.8 on 162/4. Incidental CT findings: Deferred to recent diagnostic CT. Dependent gallstones. Moderate hepatic steatosis. Caudate and lateral segment left liver lobe enlargement. Normal adrenal glands. Pelvic floor laxity. SKELETON: No marrow hypermetabolism. Left iliacus tendon hypermetabolism suggests strain including on 168/4. Incidental CT findings: Osteopenia. IMPRESSION: 1. Hypermetabolic abdominal adenopathy, consistent with lymphoma. ([place]) 5 2. Mild hypermetabolism involving mediastinal and bilateral axillary nodes, equivocal but favored to represent active lymphoma. 3. Sigmoid colonic focal hypermetabolism in the setting of diverticulosis and possible diverticulitis. Correlate with colon cancer screening history, as underlying polyp or mass cannot be excluded. 4. Hepatic steatosis. Hepatic morphology for which mild cirrhosis cannot be excluded. 5. Mild limitations secondary to patient body habitus. 6. Incidental findings, including: Coronary artery atherosclerosis. Aortic Atherosclerosis (ICD10-I70.0). Cholelithiasis. Electronically Signed   By: Abigail Miyamoto M.D.   On: 01/06/2022 08:28   CT ABDOMINAL  MASS BIOPSY  Result Date: 12/27/2021 INDICATION: 71 year old with abdominal lymphadenopathy. Concern for lymphoproliferative disorder. EXAM:  CT-GUIDED LEFT RETROPERITONEAL LYMPH NODE BIOPSY MEDICATIONS: Moderate sedation ANESTHESIA/SEDATION: Moderate (conscious) sedation was employed during this procedure. A total of Versed 1.0 mg and Fentanyl 50 mcg was administered intravenously by the radiology nurse. Total intra-service moderate Sedation Time: 28 minutes. The patient's level of consciousness and vital signs were monitored continuously by radiology nursing throughout the procedure under my direct supervision. FLUOROSCOPY TIME:  None COMPLICATIONS: None immediate. PROCEDURE: Informed written consent was obtained from the patient after a thorough discussion of the procedural risks, benefits and alternatives. All questions were addressed. A timeout was performed prior to the initiation of the procedure. Patient was placed prone. CT images through the abdomen were obtained. Enlarged left periaortic lymph nodes were targeted. Left side of the back was prepped with chlorhexidine and sterile field was created. Skin was anesthetized using 1% lidocaine. Small incision was made. Using CT guidance, 17 gauge coaxial needle was directed into the enlarged left periaortic lymph node. Needle was placed within the posterior aspect of the enlarged lymph node. Three 18 gauge core biopsies were obtained. Specimens placed on a Telfa pad with saline. 17 gauge needle was removed. Follow up CT images were obtained. Bandage placed over the puncture site. RADIATION DOSE REDUCTION: This exam was performed according to the departmental dose-optimization program which includes automated exposure control, adjustment of the mA and/or kV according to patient size and/or use of iterative reconstruction technique. FINDINGS: Enlarged left retroperitoneal lymph nodes. Biopsy needle was confirmed within an enlarged left periaortic lymph node. Three adequate specimens obtained. No immediate bleeding or hematoma formation. IMPRESSION: Successful CT-guided core biopsy of an enlarged left  retroperitoneal lymph node. Electronically Signed   By: Markus Daft M.D.   On: 12/27/2021 12:23    ASSESSMENT & PLAN Heather Petty 71 y.o. female with medical history significant for newly diagnosed Hodgkin's lymphoma who presents for a follow up visit.   #Hodgkin's Lymphoma, Stage III --biopsy of abdominal lymph node on 12/27/2021 showed classical Hodgkin's lymphoma. PET CT scan scheduled for 01/05/2022.  --today's labs to include CMP, CBC, ESR and LDH --plan for start of therapy once staging scans and port placement are complete. Will start AVD chemotherapy x 2 cycles followed by PET CT scan.  --RTC for start of treatment.   #Supportive Care -- chemotherapy education to be scheduled  -- port placement scheduled.  --TTE to be performed -- not planning for bleomycin, no need for PFTs.  --  compazine 28m PO q6H for nausea (zofran contraindicated due to QT prolongated medications).  -- allopurinol 3068mPO daily for TLS prophylaxis -- EMLA cream for port -- no pain medication required at this time.    Orders Placed This Encounter  Procedures   CBC with Differential ([facility])    Standing Status:   Future    Standing Expiration Date:   01/18/2023   CMP ([facility])    Standing Status:   Future    Standing Expiration Date:   01/18/2023   CBC with Differential ([facility])    Standing Status:   Future    Standing Expiration Date:   02/01/2023   CMP ([facility])    Standing Status:   Future    Standing Expiration Date:   02/01/2023   [facility]    A baseline Echo/ [place] should be obtained prior to initiation of Anthracycline Chemotherapy.  All questions were answered. The patient knows to call the clinic with any problems, questions or concerns.  A total of more than 30 minutes were spent on this encounter with face-to-face time and non-face-to-face time, including preparing to see the patient, ordering tests and/or  medications, counseling the patient and coordination of care as outlined above.   Ledell Peoples, MD Department of Hematology/Oncology [place] [facility] Phone: [phone] Pager: [phone] Email: [email or web address]  01/10/2022 6:05 PM

## 2022-01-05 ENCOUNTER — Encounter (HOSPITAL_COMMUNITY)
Admission: RE | Admit: 2022-01-05 | Discharge: 2022-01-05 | Disposition: A | Discharge status: Home / Self Care | Payer: Medicare Other | Source: Ambulatory Visit | Attending: Physician Assistant | Admitting: Physician Assistant

## 2022-01-05 DIAGNOSIS — C8173 Other classical Hodgkin lymphoma, intra-abdominal lymph nodes: Secondary | ICD-10-CM | POA: Diagnosis not present

## 2022-01-05 LAB — GLUCOSE, CAPILLARY: Glucose-Capillary: 144 mg/dL — ABNORMAL HIGH (ref 70–99)

## 2022-01-05 MED ORDER — FLUDEOXYGLUCOSE F - 18 (FDG) INJECTION
11.0000 | Freq: Once | INTRAVENOUS | Status: AC | PRN
  Administered 2022-01-05: 11 via INTRAVENOUS

## 2022-01-06 ENCOUNTER — Ambulatory Visit (HOSPITAL_COMMUNITY)
Admission: RE | Admit: 2022-01-06 | Discharge: 2022-01-06 | Disposition: A | Discharge status: Home / Self Care | Payer: Medicare Other | Source: Ambulatory Visit | Attending: Physician Assistant | Admitting: Physician Assistant

## 2022-01-06 DIAGNOSIS — Z0189 Encounter for other specified special examinations: Secondary | ICD-10-CM

## 2022-01-06 DIAGNOSIS — E669 Obesity, unspecified: Secondary | ICD-10-CM | POA: Insufficient documentation

## 2022-01-06 DIAGNOSIS — I10 Essential (primary) hypertension: Secondary | ICD-10-CM | POA: Diagnosis not present

## 2022-01-06 DIAGNOSIS — Z6841 Body Mass Index (BMI) 40.0 and over, adult: Secondary | ICD-10-CM | POA: Diagnosis not present

## 2022-01-06 DIAGNOSIS — C8173 Other classical Hodgkin lymphoma, intra-abdominal lymph nodes: Secondary | ICD-10-CM | POA: Insufficient documentation

## 2022-01-06 NOTE — Progress Notes (Signed)
  Echocardiogram 2D Echocardiogram has been performed.  Heather Petty 01/06/2022, 10:40 AM

## 2022-01-08 LAB — ECHOCARDIOGRAM COMPLETE
AR max vel: 2.51 cm2
AV Area VTI: 2.51 cm2
AV Area mean vel: 2.54 cm2
AV Mean grad: 3 mmHg
AV Peak grad: 5.9 mmHg
Ao pk vel: 1.21 m/s
Area-P 1/2: 3.31 cm2
Calc EF: 61.8 %
MV M vel: 3.43 m/s
MV Peak grad: 47.1 mmHg
S' Lateral: 2.9 cm
Single Plane A2C EF: 61.8 %
Single Plane A4C EF: 62.2 %

## 2022-01-09 ENCOUNTER — Other Ambulatory Visit: Payer: Self-pay | Admitting: Radiology

## 2022-01-09 NOTE — H&P (Incomplete)
Referring Physician(s): Dorsey,J  Supervising Physician: Aletta Edouard  Patient Status:  [facility] OP  Chief Complaint:  "I'm here for a port a cath"  Subjective: Patient familiar to IR service from left retroperitoneal lymph node biopsy on 12/27/2021.  Past medical history significant for anemia, arthritis, asthma, depression, diabetes, GERD, gout, hyperlipidemia, hypertension, prior renal insufficiency, obesity, NASH, schizophrenia.   She presents now with newly diagnosed Hodgkin's lymphoma and is scheduled today for Port-A-Cath placement to assist with treatment.She denies fever,HA,CP,dyspnea, cough, abd pain,vomiting or bleeding. She does have back pain and occ nausea.     Past Medical History:  Diagnosis Date   AKI (acute kidney injury) ([place]) 12/17/2017   Allergic rhinitis    Anemia    Arthritis    Asthma    Autonomic neuropathy    Depression    Diabetes mellitus    Diabetes mellitus without complication (HCC)    E coli infection    GERD (gastroesophageal reflux disease)    GERD (gastroesophageal reflux disease)    Gout    Hepatitis A    High cholesterol    Hypertension    Insomnia    Kidney failure    Metabolic encephalopathy    Morbid obesity (HCC)    NASH (nonalcoholic steatohepatitis)    Pneumonia 12/17/2017   Schizophrenia ([place])    Stroke ([place])    Vitamin B deficiency    Vitamin D deficiency    Past Surgical History:  Procedure Laterality Date   CYST EXCISION     buttucks   DENTAL SURGERY     TONSILLECTOMY          Allergies: Vancomycin, Lasix [furosemide], and Lasix [furosemide]  Medications: Prior to Admission medications   Medication Sig Start Date End Date Taking? Authorizing Provider  albuterol (VENTOLIN HFA) 108 (90 Base) MCG/ACT inhaler Inhale 2 puffs into the lungs every 4 (four) hours as needed for wheezing or shortness of breath.    [provider]  allopurinol (ZYLOPRIM) 300 MG tablet Take 300 mg by mouth daily.     [provider]  amLODipine (NORVASC) 10 MG tablet Take 10 mg by mouth daily.    [provider]  ammonium lactate (LAC-HYDRIN) 12 % lotion Apply 1 application topically daily as needed for dry skin. Patient not taking: Reported on 01/04/2022    [provider]  carvedilol (COREG) 6.25 MG tablet Take 6.25 mg by mouth 2 (two) times daily with a meal.    [provider]  Dextromethorphan-guaiFENesin 5-100 MG/5ML LIQD Take 10 mLs by mouth every 6 (six) hours as needed.    [provider]  doxepin (SINEQUAN) 25 MG capsule Take 25 mg by mouth at bedtime.    [provider]  famotidine (PEPCID) 20 MG tablet Take 20 mg by mouth 2 (two) times daily.    [provider]  glipiZIDE (GLUCOTROL XL) 10 MG 24 hr tablet Take 10 mg by mouth 2 (two) times daily.     [provider]  HYDROcodone-acetaminophen (NORCO/VICODIN) 5-325 MG tablet Take 1 tablet by mouth 2 (two) times daily.    [provider]  hydrOXYzine (ATARAX/VISTARIL) 25 MG tablet Take 25 mg by mouth in the morning and at bedtime.    [provider]  insulin aspart (NOVOLOG) 100 UNIT/ML injection Inject 0-12 Units into the skin See admin instructions. Inject 0-12 units into the skin 4 times a day (before meals and at bedtime) per sliding scale: BGL <60 or >400  =  CALL MD; 60-200 = 0 units; 201-250 = 2 units; 251-300 = 4 units; 301-350 = 6 units; 351-400 = 8 units; 401-450 = 10 units; >450 = 12 units 07/31/19   Annita Brod, MD  insulin glargine (LANTUS) 100 UNIT/ML injection Inject 0.1 mLs (10 Units total) into the skin at bedtime. Patient taking differently: Inject 22 Units into the skin at bedtime. 09/09/19   Pokhrel, Laxman, MD  lidocaine (LIDODERM) 5 % Place 1 patch onto the skin daily. Remove & Discard patch within 12 hours or as directed by MD    [provider]  lisinopril (ZESTRIL) 5 MG tablet Take 5 mg by mouth daily.    [provider]   loperamide (IMODIUM) 2 MG capsule Take 1 capsule (2 mg total) by mouth as needed for diarrhea or loose stools. 09/09/19   Pokhrel, Corrie Mckusick, MD  Polyethyl Glycol-Propyl Glycol (SYSTANE ULTRA) 0.4-0.3 % SOLN Place 2 drops into both eyes in the morning and at bedtime.    [provider]  polyethylene glycol (MIRALAX / GLYCOLAX) 17 g packet Take 17 g by mouth daily as needed for mild constipation.    [provider]  risperiDONE (RISPERDAL) 0.5 MG tablet Take 0.5 mg by mouth daily.    [provider]  Semaglutide,0.25 or 0.5MG/DOS, (OZEMPIC, 0.25 OR 0.5 MG/DOSE,) 2 MG/1.5ML SOPN Inject 0.5 mg into the skin once a week. On Sunday Patient not taking: Reported on 01/04/2022    [provider]  triamcinolone (NASACORT ALLERGY 24HR) 55 MCG/ACT AERO nasal inhaler Place 1 spray into the nose daily.    [provider]     Vital Signs: Vitals:   01/10/22 1304  BP: (!) 151/67  Pulse: 65  Resp: 16  Temp: 98.1 F (36.7 C)  SpO2: 97%      Physical Exam ; awake/alert; chest- CTA bilat ant; heart- RRR; abd- obese, soft,+BS,NT  Imaging: ECHOCARDIOGRAM COMPLETE  Result Date: 01/08/2022    ECHOCARDIOGRAM REPORT   Patient Name:   Heather Petty Date of Exam: 01/06/2022 Medical Rec #:  [id number]        Height:       61.0 in Accession #:    [id number]       Weight:       221.3 lb Date of Birth:  06/07/1950        BSA:          1.972 m Patient Age:    71 years         BP:           135/74 mmHg Patient Gender: F                HR:           65  bpm. Exam Location:  Outpatient Procedure: 2D Echo and Strain Analysis Indications:    Chemo  History:        Patient has prior history of Echocardiogram examinations, most                 recent 09/04/2019. Risk Factors:Hypertension.  Sonographer:    Harvie Junior Referring Phys: [id number] IRENE T THAYIL  Sonographer Comments: Technically difficult study due to poor echo windows and patient is obese. Image acquisition challenging due  to patient body habitus. Global longitudinal strain was attempted. IMPRESSIONS  1. Left ventricular ejection fraction, by estimation, is 60 to 65%. The left ventricle has normal function. The left ventricle has no regional wall motion abnormalities. Left ventricular diastolic  parameters are consistent with Grade I diastolic dysfunction (impaired relaxation). The average left ventricular global longitudinal strain is -19.1 %. The global longitudinal strain is normal.  2. Right ventricular systolic function is normal. The right ventricular size is normal. There is normal pulmonary artery systolic pressure.  3. The mitral valve is normal in structure. No evidence of mitral valve regurgitation. No evidence of mitral stenosis.  4. The aortic valve was not well visualized. Aortic valve regurgitation is not visualized. No aortic stenosis is present.  5. The inferior vena cava is normal in size with greater than 50% respiratory variability, suggesting right atrial pressure of 3 mmHg. FINDINGS  Left Ventricle: Left ventricular ejection fraction, by estimation, is 60 to 65%. The left ventricle has normal function. The left ventricle has no regional wall motion abnormalities. The average left ventricular global longitudinal strain is -19.1 %. The global longitudinal strain is normal. The left ventricular internal cavity size was normal in size. There is no left ventricular hypertrophy. Left ventricular diastolic parameters are consistent with Grade I diastolic dysfunction (impaired relaxation). Indeterminate filling pressures. Right Ventricle: The right ventricular size is normal. No increase in right ventricular wall thickness. Right ventricular systolic function is normal. There is normal pulmonary artery systolic pressure. The tricuspid regurgitant velocity is 2.54 m/s, and  with an assumed right atrial pressure of 3 mmHg, the estimated right ventricular systolic pressure is 64.4 mmHg. Left Atrium: Left atrial size was  normal in size. Right Atrium: Right atrial size was normal in size. Pericardium: There is no evidence of pericardial effusion. Mitral Valve: The mitral valve is normal in structure. No evidence of mitral valve regurgitation. No evidence of mitral valve stenosis. Tricuspid Valve: The tricuspid valve is normal in structure. Tricuspid valve regurgitation is trivial. No evidence of tricuspid stenosis. Aortic Valve: The aortic valve was not well visualized. Aortic valve regurgitation is not visualized. No aortic stenosis is present. Aortic valve mean gradient measures 3.0 mmHg. Aortic valve peak gradient measures 5.9 mmHg. Aortic valve area, by VTI measures 2.51 cm. Pulmonic Valve: The pulmonic valve was normal in structure. Pulmonic valve regurgitation is not visualized. No evidence of pulmonic stenosis. Aorta: The aortic root is normal in size and structure. Venous: The inferior vena cava is normal in size with greater than 50% respiratory variability, suggesting right atrial pressure of 3 mmHg. IAS/Shunts: No atrial level shunt detected by color flow Doppler.  LEFT VENTRICLE PLAX 2D LVIDd:         4.50 cm     Diastology LVIDs:         2.90 cm     LV e' medial:    6.74 cm/s LV PW:         0.90 cm     LV E/e' medial:  13.9 LV IVS:        0.90 cm     LV e' lateral:   11.70 cm/s LVOT diam:     1.90 cm     LV E/e' lateral: 8.0 LV SV:         66 LV SV Index:   34          2D Longitudinal Strain LVOT Area:     2.84 cm    2D Strain GLS (A2C):   -18.9 %                            2D Strain GLS (A3C):   -19.9 %  2D Strain GLS (A4C):   -18.5 % LV Volumes (MOD)           2D Strain GLS Avg:     -19.1 % LV vol d, MOD A2C: 62.8 ml LV vol d, MOD A4C: 74.7 ml LV vol s, MOD A2C: 24.0 ml LV vol s, MOD A4C: 28.2 ml 3D Volume EF: LV SV MOD A2C:     38.8 ml 3D EF:        73 % LV SV MOD A4C:     74.7 ml LV EDV:       205 ml LV SV MOD BP:      41.9 ml LV ESV:       55 ml                            LV SV:         150 ml RIGHT VENTRICLE RV Basal diam:  3.20 cm RV Mid diam:    3.00 cm RV S prime:     12.90 cm/s TAPSE (M-mode): 1.7 cm LEFT ATRIUM           Index        RIGHT ATRIUM          Index LA diam:      2.70 cm 1.37 cm/m   RA Area:     8.79 cm LA Vol (A2C): 31.5 ml 15.98 ml/m  RA Volume:   15.60 ml 7.91 ml/m LA Vol (A4C): 26.5 ml 13.44 ml/m  AORTIC VALVE                    PULMONIC VALVE AV Area (Vmax):    2.51 cm     PV Vmax:       1.10 m/s AV Area (Vmean):   2.54 cm     PV Peak grad:  4.8 mmHg AV Area (VTI):     2.51 cm AV Vmax:           121.00 cm/s AV Vmean:          79.300 cm/s AV VTI:            0.264 m AV Peak Grad:      5.9 mmHg AV Mean Grad:      3.0 mmHg LVOT Vmax:         107.00 cm/s LVOT Vmean:        71.000 cm/s LVOT VTI:          0.234 m LVOT/AV VTI ratio: 0.89  AORTA Ao Root diam: 2.60 cm Ao Asc diam:  2.70 cm MITRAL VALVE                TRICUSPID VALVE MV Area (PHT): 3.31 cm     TR Peak grad:   25.8 mmHg MV Decel Time: 229 msec     TR Vmax:        254.00 cm/s MR Peak grad: 47.1 mmHg MR Vmax:      343.00 cm/s   SHUNTS MV E velocity: 93.80 cm/s   Systemic VTI:  0.23 m MV A velocity: 108.00 cm/s  Systemic Diam: 1.90 cm MV E/A ratio:  0.87 Skeet Latch MD Electronically signed by Skeet Latch MD Signature Date/Time: 01/08/2022/5:24:10 PM    Final     Labs:  CBC: Recent Labs    12/09/21 1723 12/27/21 0932 01/04/22 1456  WBC 8.5 8.3 10.3  HGB 12.7 13.9 13.1  HCT 38.3 42.6 39.5  PLT 148* 145* 172    COAGS: Recent Labs    12/27/21 0932  INR 1.1    BMP: Recent Labs    12/09/21 1723 01/04/22 1456  NA 139 141  K 3.4* 3.6  CL 109 108  CO2 24 26  GLUCOSE 156* 83  BUN 12 13  CALCIUM 8.7* 9.3  CREATININE 0.78 0.77  GFRNONAA >60 >60    LIVER FUNCTION TESTS: Recent Labs    12/09/21 1723 01/04/22 1456  BILITOT 0.6 0.5  AST 14* 16  ALT 11 15  ALKPHOS 128* 131*  PROT 6.2* 7.4  ALBUMIN 3.3* 4.0    Assessment and Plan: Patient familiar to IR service from left  retroperitoneal lymph node biopsy on 12/27/2021.  Past medical history significant for anemia, arthritis, asthma, depression, diabetes, GERD, gout, hyperlipidemia, hypertension, prior renal insufficiency, obesity, NASH, schizophrenia.   She presents now with newly diagnosed Hodgkin's lymphoma and is scheduled today for Port-A-Cath placement to assist with treatment.Risks and benefits of image guided port-a-catheter placement was discussed with the patient including, but not limited to bleeding, infection, pneumothorax, or fibrin sheath development and need for additional procedures.  All of the patient's questions were answered, patient is agreeable to proceed. Consent signed and in chart.    Electronically Signed: D. Rowe Robert, PA-C 01/09/2022, 4:56 PM   I spent a total of 25 minutes at the the patient's bedside AND on the patient's hospital floor or unit, greater than 50% of which was counseling/coordinating care for port a cath placement

## 2022-01-10 ENCOUNTER — Encounter: Payer: Self-pay | Admitting: Hematology and Oncology

## 2022-01-10 ENCOUNTER — Ambulatory Visit (HOSPITAL_COMMUNITY)
Admission: RE | Admit: 2022-01-10 | Discharge: 2022-01-10 | Disposition: A | Discharge status: Home / Self Care | Payer: Medicare Other | Source: Ambulatory Visit | Attending: Physician Assistant | Admitting: Physician Assistant

## 2022-01-10 ENCOUNTER — Encounter (HOSPITAL_COMMUNITY): Payer: Self-pay

## 2022-01-10 DIAGNOSIS — E785 Hyperlipidemia, unspecified: Secondary | ICD-10-CM | POA: Insufficient documentation

## 2022-01-10 DIAGNOSIS — I10 Essential (primary) hypertension: Secondary | ICD-10-CM | POA: Insufficient documentation

## 2022-01-10 DIAGNOSIS — C8173 Other classical Hodgkin lymphoma, intra-abdominal lymph nodes: Secondary | ICD-10-CM | POA: Diagnosis present

## 2022-01-10 DIAGNOSIS — J45909 Unspecified asthma, uncomplicated: Secondary | ICD-10-CM | POA: Insufficient documentation

## 2022-01-10 DIAGNOSIS — C8190 Hodgkin lymphoma, unspecified, unspecified site: Secondary | ICD-10-CM | POA: Insufficient documentation

## 2022-01-10 DIAGNOSIS — K219 Gastro-esophageal reflux disease without esophagitis: Secondary | ICD-10-CM | POA: Insufficient documentation

## 2022-01-10 DIAGNOSIS — E119 Type 2 diabetes mellitus without complications: Secondary | ICD-10-CM | POA: Diagnosis not present

## 2022-01-10 DIAGNOSIS — F209 Schizophrenia, unspecified: Secondary | ICD-10-CM | POA: Insufficient documentation

## 2022-01-10 HISTORY — PX: IR IMAGING GUIDED PORT INSERTION: IMG5740

## 2022-01-10 LAB — GLUCOSE, CAPILLARY: Glucose-Capillary: 135 mg/dL — ABNORMAL HIGH (ref 70–99)

## 2022-01-10 MED ORDER — PROCHLORPERAZINE MALEATE 10 MG PO TABS: 10.0000 mg | ORAL_TABLET | Freq: Four times a day (QID) | ORAL | 0 refills | Status: DC | PRN

## 2022-01-10 MED ORDER — SODIUM CHLORIDE 0.9 % IV SOLN: INTRAVENOUS | Status: DC

## 2022-01-10 MED ORDER — HEPARIN SOD (PORK) LOCK FLUSH 100 UNIT/ML IV SOLN
INTRAVENOUS | Status: AC
  Administered 2022-01-10: 500 [IU]
  Filled 2022-01-10: qty 5

## 2022-01-10 MED ORDER — LIDOCAINE-PRILOCAINE 2.5-2.5 % EX CREA: 1.0000 | TOPICAL_CREAM | CUTANEOUS | 0 refills | Status: DC | PRN

## 2022-01-10 MED ORDER — LIDOCAINE HCL 1 % IJ SOLN
INTRAMUSCULAR | Status: AC
  Administered 2022-01-10: 13 mL via INTRADERMAL
  Filled 2022-01-10: qty 20

## 2022-01-10 MED ORDER — FENTANYL CITRATE (PF) 100 MCG/2ML IJ SOLN
INTRAMUSCULAR | Status: AC
  Filled 2022-01-10: qty 2

## 2022-01-10 MED ORDER — MIDAZOLAM HCL 2 MG/2ML IJ SOLN
INTRAMUSCULAR | Status: AC | PRN
  Administered 2022-01-10 (×2): 1 mg via INTRAVENOUS

## 2022-01-10 MED ORDER — FENTANYL CITRATE (PF) 100 MCG/2ML IJ SOLN
INTRAMUSCULAR | Status: AC | PRN
  Administered 2022-01-10: 50 ug via INTRAVENOUS

## 2022-01-10 MED ORDER — MIDAZOLAM HCL 2 MG/2ML IJ SOLN
INTRAMUSCULAR | Status: AC
  Filled 2022-01-10: qty 4

## 2022-01-10 NOTE — Procedures (Signed)
Interventional Radiology Procedure Note  Procedure: Single Lumen Power Port Placement    Access:  Right IJ vein.  Findings: Catheter tip positioned at SVC/RA junction. Port is ready for immediate use.   Complications: None  EBL: < 10 mL  Recommendations:  - Ok to shower in 24 hours - Do not submerge for 7 days - Routine line care   Avynn Klassen T. Kathlene Cote, M.D Pager:  [phone]

## 2022-01-10 NOTE — Discharge Instructions (Signed)
For questions /concerns may call Interventional Radiology at [phone] or  Interventional Radiology clinic [phone]   You may remove your dressing and shower tomorrow afternoon  DO NOT use EMLA cream for 2 weeks after port placement as the cream will remove surgical glue on your incision.     Moderate Conscious Sedation, Adult, Care After This sheet gives you information about how to care for yourself after your procedure. Your health care provider may also give you more specific instructions. If you have problems or questions, contact your health careprovider. What can I expect after the procedure? After the procedure, it is common to have: Sleepiness for several hours. Impaired judgment for several hours. Difficulty with balance. Vomiting if you eat too soon. Follow these instructions at home: For the time period you were told by your health care provider: Rest. Do not participate in activities where you could fall or become injured. Do not drive or use machinery. Do not drink alcohol. Do not take sleeping pills or medicines that cause drowsiness. Do not make important decisions or sign legal documents. Do not take care of children on your own. Eating and drinking  Follow the diet recommended by your health care provider. Drink enough fluid to keep your urine pale yellow. If you vomit: Drink water, juice, or soup when you can drink without vomiting. Make sure you have little or no nausea before eating solid foods.  General instructions Take over-the-counter and prescription medicines only as told by your health care provider. Have a responsible adult stay with you for the time you are told. It is important to have someone help care for you until you are awake and alert. Do not smoke. Keep all follow-up visits as told by your health care provider. This is important. Contact a health care provider if: You are still sleepy or having trouble with balance after 24  hours. You feel light-headed. You keep feeling nauseous or you keep vomiting. You develop a rash. You have a fever. You have redness or swelling around the IV site. Get help right away if: You have trouble breathing. You have new-onset confusion at home. Summary After the procedure, it is common to feel sleepy, have impaired judgment, or feel nauseous if you eat too soon. Rest after you get home. Know the things you should not do after the procedure. Follow the diet recommended by your health care provider and drink enough fluid to keep your urine pale yellow. Get help right away if you have trouble breathing or new-onset confusion at home. This information is not intended to replace advice given to you by your health care provider. Make sure you discuss any questions you have with your healthcare provider.   Implanted Port Insertion, Care After This sheet gives you information about how to care for yourself after your procedure. Your health care provider may also give you more specific instructions. If you have problems or questions, contact your health careprovider. What can I expect after the procedure? After the procedure, it is common to have: Discomfort at the port insertion site. Bruising on the skin over the port. This should improve over 3-4 days. Follow these instructions at home: [facility] care After your port is placed, you will get a manufacturer's information card. The card has information about your port. Keep this card with you at all times. Take care of the port as told by your health care provider. Ask your health care provider if you or a family member can get training for  taking care of the port at home. A home health care nurse may also take care of the port. Make sure to remember what type of port you have. Incision care Follow instructions from your health care provider about how to take care of your port insertion site. Make sure you: Wash your hands with soap and  water before and after you change your bandage (dressing). If soap and water are not available, use hand sanitizer. Change your dressing as told by your health care provider. Leave skin glue, or adhesive strips in place. These skin closures may need to stay in place for 2 weeks or longer.  Check your port insertion site every day for signs of infection. Check for:      - Redness, swelling, or pain.                     - Fluid or blood.      - Warmth.      - Pus or a bad smell. Activity Return to your normal activities as told by your health care provider. Ask your health care provider what activities are safe for you. Do not lift anything that is heavier than 10 lb (4.5 kg), or the limit that you are told, until your health care provider says that it is safe. General instructions Take over-the-counter and prescription medicines only as told by your health care provider. Do not take baths, swim, or use a hot tub until your health care provider approves. Ask your health care provider if you may take showers. You may only be allowed to take sponge baths. Do not drive for 24 hours if you were given a sedative during your procedure. Wear a medical alert bracelet in case of an emergency. This will tell any health care providers that you have a port. Keep all follow-up visits as told by your health care provider. This is important. Contact a health care provider if: You cannot flush your port with saline as directed, or you cannot draw blood from the port. You have a fever or chills. You have redness, swelling, or pain around your port insertion site. You have fluid or blood coming from your port insertion site. Your port insertion site feels warm to the touch. You have pus or a bad smell coming from the port insertion site. Get help right away if: You have chest pain or shortness of breath. You have bleeding from your port that you cannot control. Summary Take care of the port as told by your  health care provider. Keep the manufacturer's information card with you at all times. Change your dressing as told by your health care provider. Contact a health care provider if you have a fever or chills or if you have redness, swelling, or pain around your port insertion site. Keep all follow-up visits as told by your health care provider. This information is not intended to replace advice given to you by your health care provider. Make sure you discuss any questions you have with your healthcare provider. Document Revised: 08/21/2017 Document Reviewed: 08/21/2017

## 2022-01-10 NOTE — Progress Notes (Signed)
START OFF PATHWAY REGIMEN - Lymphoma and CLL   [id number]:ABVD q28 Days:   A cycle is every 28 days:     Bleomycin      Dacarbazine      Doxorubicin      Vinblastine   **Always confirm dose/schedule in your pharmacy ordering system**  Patient Characteristics: Classic Hodgkin Lymphoma, First Line, Stage III / IV, Age ? 58 Disease Type: Not Applicable Disease Type: Not Applicable Disease Type: Classic Hodgkin Lymphoma Line of therapy: First Line Age: ? 42 Intent of Therapy: Curative Intent, Discussed with Patient

## 2022-01-11 ENCOUNTER — Telehealth: Payer: Self-pay | Admitting: Hematology and Oncology

## 2022-01-11 NOTE — Telephone Encounter (Signed)
Sched per 12/6 IB, Heather Petty has been called and confirmed

## 2022-01-11 NOTE — Telephone Encounter (Signed)
Attempted to call patient, phone not in service. Attempted to call patient's daughter, not available. Will f/u to notify of upcoming appointments.

## 2022-01-13 ENCOUNTER — Other Ambulatory Visit: Payer: Self-pay

## 2022-01-15 ENCOUNTER — Other Ambulatory Visit: Payer: Self-pay

## 2022-01-16 ENCOUNTER — Other Ambulatory Visit: Payer: Self-pay

## 2022-01-16 ENCOUNTER — Inpatient Hospital Stay: Payer: Medicare Other | Attending: Physician Assistant

## 2022-01-16 ENCOUNTER — Other Ambulatory Visit: Payer: Self-pay | Admitting: *Deleted

## 2022-01-16 DIAGNOSIS — C8198 Hodgkin lymphoma, unspecified, lymph nodes of multiple sites: Secondary | ICD-10-CM | POA: Insufficient documentation

## 2022-01-16 DIAGNOSIS — Z5111 Encounter for antineoplastic chemotherapy: Secondary | ICD-10-CM | POA: Insufficient documentation

## 2022-01-16 MED ORDER — LIDOCAINE-PRILOCAINE 2.5-2.5 % EX CREA: 1.0000 | TOPICAL_CREAM | CUTANEOUS | 0 refills | Status: AC | PRN

## 2022-01-16 MED ORDER — PROCHLORPERAZINE MALEATE 10 MG PO TABS: 10.0000 mg | ORAL_TABLET | Freq: Four times a day (QID) | ORAL | 0 refills | Status: DC | PRN

## 2022-01-16 MED FILL — Fosaprepitant Dimeglumine For IV Infusion 150 MG (Base Eq): INTRAVENOUS | Qty: 5 | Status: AC

## 2022-01-16 MED FILL — Dexamethasone Sodium Phosphate Inj 100 MG/10ML: INTRAMUSCULAR | Qty: 1 | Status: AC

## 2022-01-17 ENCOUNTER — Inpatient Hospital Stay: Payer: Medicare Other

## 2022-01-17 ENCOUNTER — Inpatient Hospital Stay (HOSPITAL_BASED_OUTPATIENT_CLINIC_OR_DEPARTMENT_OTHER): Payer: Medicare Other | Admitting: Physician Assistant

## 2022-01-17 ENCOUNTER — Other Ambulatory Visit: Payer: Medicare Other

## 2022-01-17 ENCOUNTER — Ambulatory Visit: Payer: Medicare Other

## 2022-01-17 VITALS — BP 157/77 | HR 77 | Resp 18

## 2022-01-17 DIAGNOSIS — C8198 Hodgkin lymphoma, unspecified, lymph nodes of multiple sites: Secondary | ICD-10-CM

## 2022-01-17 DIAGNOSIS — T50905A Adverse effect of unspecified drugs, medicaments and biological substances, initial encounter: Secondary | ICD-10-CM

## 2022-01-17 DIAGNOSIS — C8173 Other classical Hodgkin lymphoma, intra-abdominal lymph nodes: Secondary | ICD-10-CM

## 2022-01-17 DIAGNOSIS — Z95828 Presence of other vascular implants and grafts: Secondary | ICD-10-CM

## 2022-01-17 DIAGNOSIS — Z5111 Encounter for antineoplastic chemotherapy: Secondary | ICD-10-CM | POA: Diagnosis not present

## 2022-01-17 LAB — CMP (CANCER CENTER ONLY)
ALT: 14 U/L (ref 0–44)
AST: 12 U/L — ABNORMAL LOW (ref 15–41)
Albumin: 3.9 g/dL (ref 3.5–5.0)
Alkaline Phosphatase: 140 U/L — ABNORMAL HIGH (ref 38–126)
Anion gap: 7 (ref 5–15)
BUN: 16 mg/dL (ref 8–23)
CO2: 26 mmol/L (ref 22–32)
Calcium: 9.4 mg/dL (ref 8.9–10.3)
Chloride: 104 mmol/L (ref 98–111)
Creatinine: 0.78 mg/dL (ref 0.44–1.00)
GFR, Estimated: >60 mL/min (ref >60)
Glucose, Bld: 292 mg/dL — ABNORMAL HIGH (ref 70–99)
Potassium: 3.7 mmol/L (ref 3.5–5.1)
Sodium: 137 mmol/L (ref 135–145)
Total Bilirubin: 0.4 mg/dL (ref 0.3–1.2)
Total Protein: 6.6 g/dL (ref 6.5–8.1)

## 2022-01-17 LAB — CBC WITH DIFFERENTIAL (CANCER CENTER ONLY)
Abs Immature Granulocytes: 0.03 10*3/uL (ref 0.00–0.07)
Basophils Absolute: 0 10*3/uL (ref 0.0–0.1)
Basophils Relative: 0 %
Eosinophils Absolute: 0.2 10*3/uL (ref 0.0–0.5)
Eosinophils Relative: 2 %
HCT: 37.3 % (ref 36.0–46.0)
Hemoglobin: 12.5 g/dL (ref 12.0–15.0)
Immature Granulocytes: 0 %
Lymphocytes Relative: 15 %
Lymphs Abs: 1.5 10*3/uL (ref 0.7–4.0)
MCH: 30.6 pg (ref 26.0–34.0)
MCHC: 33.5 g/dL (ref 30.0–36.0)
MCV: 91.2 fL (ref 80.0–100.0)
Monocytes Absolute: 0.5 10*3/uL (ref 0.1–1.0)
Monocytes Relative: 5 %
Neutro Abs: 7.7 10*3/uL (ref 1.7–7.7)
Neutrophils Relative %: 78 %
Platelet Count: 154 10*3/uL (ref 150–400)
RBC: 4.09 MIL/uL (ref 3.87–5.11)
RDW: 13.4 % (ref 11.5–15.5)
WBC Count: 9.9 10*3/uL (ref 4.0–10.5)
nRBC: 0 % (ref 0.0–0.2)

## 2022-01-17 LAB — LACTATE DEHYDROGENASE: LDH: 208 U/L — ABNORMAL HIGH (ref 98–192)

## 2022-01-17 LAB — SEDIMENTATION RATE: Sed Rate: 45 mm/hr — ABNORMAL HIGH (ref 0–22)

## 2022-01-17 MED ORDER — SODIUM CHLORIDE 0.9% FLUSH
10.0000 mL | INTRAVENOUS | Status: DC | PRN
  Administered 2022-01-17: 10 mL

## 2022-01-17 MED ORDER — SODIUM CHLORIDE 0.9 % IV SOLN
10.0000 mg | Freq: Once | INTRAVENOUS | Status: AC
  Administered 2022-01-17: 10 mg via INTRAVENOUS
  Filled 2022-01-17: qty 10

## 2022-01-17 MED ORDER — SODIUM CHLORIDE 0.9 % IV SOLN
375.0000 mg/m2 | Freq: Once | INTRAVENOUS | Status: AC
  Administered 2022-01-17: 780 mg via INTRAVENOUS
  Filled 2022-01-17: qty 78

## 2022-01-17 MED ORDER — HEPARIN SOD (PORK) LOCK FLUSH 100 UNIT/ML IV SOLN
500.0000 [IU] | Freq: Once | INTRAVENOUS | Status: AC | PRN
  Administered 2022-01-17: 500 [IU]

## 2022-01-17 MED ORDER — SODIUM CHLORIDE 0.9 % IV SOLN: Freq: Once | INTRAVENOUS | Status: AC

## 2022-01-17 MED ORDER — DOXORUBICIN HCL CHEMO IV INJECTION 2 MG/ML
25.0000 mg/m2 | Freq: Once | INTRAVENOUS | Status: AC
  Administered 2022-01-17: 52 mg via INTRAVENOUS
  Filled 2022-01-17: qty 26

## 2022-01-17 MED ORDER — VINBLASTINE SULFATE CHEMO INJECTION 1 MG/ML
6.0000 mg/m2 | Freq: Once | INTRAVENOUS | Status: AC
  Administered 2022-01-17: 12.5 mg via INTRAVENOUS
  Filled 2022-01-17: qty 12.5

## 2022-01-17 MED ORDER — PALONOSETRON HCL INJECTION 0.25 MG/5ML
0.2500 mg | Freq: Once | INTRAVENOUS | Status: AC
  Administered 2022-01-17: 0.25 mg via INTRAVENOUS
  Filled 2022-01-17: qty 5

## 2022-01-17 MED ORDER — SODIUM CHLORIDE 0.9 % IV SOLN
150.0000 mg | Freq: Once | INTRAVENOUS | Status: AC
  Administered 2022-01-17: 150 mg via INTRAVENOUS
  Filled 2022-01-17: qty 150

## 2022-01-17 MED ORDER — SODIUM CHLORIDE 0.9% FLUSH
10.0000 mL | INTRAVENOUS | Status: AC | PRN
  Administered 2022-01-17: 10 mL

## 2022-01-17 NOTE — Patient Instructions (Signed)
[facility]  Discharge Instructions: Thank you for choosing [facility] to provide your oncology and hematology care.   If you have a lab appointment with the [facility], please go directly to the [facility] and check in at the registration area.   Wear comfortable clothing and clothing appropriate for easy access to any Portacath or PICC line.   We strive to give you quality time with your provider. You may need to reschedule your appointment if you arrive late (15 or more minutes).  Arriving late affects you and other patients whose appointments are after yours.  Also, if you miss three or more appointments without notifying the office, you may be dismissed from the clinic at the provider's discretion.      For prescription refill requests, have your pharmacy contact our office and allow 72 hours for refills to be completed.    Today you received the following chemotherapy and/or immunotherapy agents: Doxorubicin, Vinblastine, Dacarbazine.       To help prevent nausea and vomiting after your treatment, we encourage you to take your nausea medication as directed.  BELOW ARE SYMPTOMS THAT SHOULD BE REPORTED IMMEDIATELY: *FEVER GREATER THAN 100.4 F (38 C) OR HIGHER *CHILLS OR SWEATING *NAUSEA AND VOMITING THAT IS NOT CONTROLLED WITH YOUR NAUSEA MEDICATION *UNUSUAL SHORTNESS OF BREATH *UNUSUAL BRUISING OR BLEEDING *URINARY PROBLEMS (pain or burning when urinating, or frequent urination) *BOWEL PROBLEMS (unusual diarrhea, constipation, pain near the anus) TENDERNESS IN MOUTH AND THROAT WITH OR WITHOUT PRESENCE OF ULCERS (sore throat, sores in mouth, or a toothache) UNUSUAL RASH, SWELLING OR PAIN  UNUSUAL VAGINAL DISCHARGE OR ITCHING   Items with * indicate a potential emergency and should be followed up as soon as possible or go to the Emergency Department if any problems should occur.  Please show the CHEMOTHERAPY ALERT CARD or  IMMUNOTHERAPY ALERT CARD at check-in to the Emergency Department and triage nurse.  Should you have questions after your visit or need to cancel or reschedule your appointment, please contact [facility]  Dept: [phone]  and follow the prompts.  Office hours are 8:00 a.m. to 4:30 p.m. Monday - Friday. Please note that voicemails left after 4:00 p.m. may not be returned until the following business day.  We are closed weekends and major holidays. You have access to a nurse at all times for urgent questions. Please call the main number to the clinic Dept: [phone] and follow the prompts.   For any non-urgent questions, you may also contact your provider using MyChart. We now offer e-Visits for anyone 31 and older to request care online for non-urgent symptoms. For details visit [email or web address].   Also download the MyChart app! Go to the app store, search "MyChart", open the app, select [facility], and log in with your MyChart username and password.  Masks are optional in the cancer centers. If you would like for your care team to wear a mask while they are taking care of you, please let them know. You may have one support person who is at least 73 years old accompany you for your appointments. Doxorubicin Injection What is this medication? DOXORUBICIN (dox oh ROO bi sin) treats some types of cancer. It works by slowing down the growth of cancer cells. This medicine may be used for other purposes; ask your health care provider or pharmacist if you have questions. COMMON BRAND NAME(S): Adriamycin, Adriamycin PFS, Adriamycin RDF, Rubex What should I tell  my care team before I take this medication? They need to know if you have any of these conditions: Heart disease History of low blood cell levels caused by a medication Liver disease Recent or ongoing radiation An unusual or allergic reaction to doxorubicin, other medications, foods, dyes, or  preservatives If you or your partner are pregnant or trying to get pregnant Breast-feeding How should I use this medication? This medication is injected into a vein. It is given by your care team in a hospital or clinic setting. Talk to your care team about the use of this medication in children. Special care may be needed. Overdosage: If you think you have taken too much of this medicine contact a poison control center or emergency room at once. NOTE: This medicine is only for you. Do not share this medicine with others. What if I miss a dose? Keep appointments for follow-up doses. It is important not to miss your dose. Call your care team if you are unable to keep an appointment. What may interact with this medication? 6-mercaptopurine Paclitaxel Phenytoin St. John's wort Trastuzumab Verapamil This list may not describe all possible interactions. Give your health care provider a list of all the medicines, herbs, non-prescription drugs, or dietary supplements you use. Also tell them if you smoke, drink alcohol, or use illegal drugs. Some items may interact with your medicine. What should I watch for while using this medication? Your condition will be monitored carefully while you are receiving this medication. You may need blood work while taking this medication. This medication may make you feel generally unwell. This is not uncommon as chemotherapy can affect healthy cells as well as cancer cells. Report any side effects. Continue your course of treatment even though you feel ill unless your care team tells you to stop. There is a maximum amount of this medication you should receive throughout your life. The amount depends on the medical condition being treated and your overall health. Your care team will watch how much of this medication you receive. Tell your care team if you have taken this medication before. Your urine may turn red for a few days after your dose. This is not blood. If  your urine is dark or brown, call your care team. In some cases, you may be given additional medications to help with side effects. Follow all directions for their use. This medication may increase your risk of getting an infection. Call your care team for advice if you get a fever, chills, sore throat, or other symptoms of a cold or flu. Do not treat yourself. Try to avoid being around people who are sick. This medication may increase your risk to bruise or bleed. Call your care team if you notice any unusual bleeding. Talk to your care team about your risk of cancer. You may be more at risk for certain types of cancers if you take this medication. You should make sure that you get enough Coenzyme Q10 while you are taking this medication. Discuss the foods you eat and the vitamins you take with your care team. Talk to your care team if you or your partner may be pregnant. Serious birth defects can occur if you take this medication during pregnancy and for 6 months after the last dose. Contraception is recommended while taking this medication and for 6 months after the last dose. Your care team can help you find the option that works for you. If your partner can get pregnant, use a condom while  taking this medication and for 6 months after the last dose. Do not breastfeed while taking this medication. This medication may cause infertility. Talk to your care team if you are concerned about your fertility. What side effects may I notice from receiving this medication? Side effects that you should report to your care team as soon as possible: Allergic reactions--skin rash, itching, hives, swelling of the face, lips, tongue, or throat Heart failure--shortness of breath, swelling of the ankles, feet, or hands, sudden weight gain, unusual weakness or fatigue Heart rhythm changes--fast or irregular heartbeat, dizziness, feeling faint or lightheaded, chest pain, trouble breathing Infection--fever, chills,  cough, sore throat, wounds that don't heal, pain or trouble when passing urine, general feeling of discomfort or being unwell Low red blood cell level--unusual weakness or fatigue, dizziness, headache, trouble breathing Painful swelling, warmth, or redness of the skin, blisters or sores at the infusion site Unusual bruising or bleeding Side effects that usually do not require medical attention (report to your care team if they continue or are bothersome): Diarrhea Hair loss Nausea Pain, redness, or swelling with sores inside the mouth or throat Red urine This list may not describe all possible side effects. Call your doctor for medical advice about side effects. You may report side effects to FDA at 1-800-FDA-1088. Where should I keep my medication? This medication is given in a hospital or clinic. It will not be stored at home. NOTE: This sheet is a summary. It may not cover all possible information. If you have questions about this medicine, talk to your doctor, pharmacist, or health care provider.  2023 Elsevier/Gold Standard (2021-06-01 00:00:00) Vinblastine Injection What is this medication? VINBLASTINE (vin BLAS teen) treats some types of cancer. It works by slowing down the growth of cancer cells. This medicine may be used for other purposes; ask your health care provider or pharmacist if you have questions. COMMON BRAND NAME(S): Velban What should I tell my care team before I take this medication? They need to know if you have any of these conditions: Heart disease Infection Liver disease Low white blood cell levels Lung disease An unusual or allergic reaction to vinblastine, other chemotherapy agents, other medications, foods, dyes, or preservatives Pregnant or trying to get pregnant Breast-feeding How should I use this medication? This medication is injected into a vein. It is given by your care team in a hospital or clinic setting. Talk to your care team about the use of  this medication in children. While it may be given to children for selected conditions, precautions do apply. Overdosage: If you think you have taken too much of this medicine contact a poison control center or emergency room at once. NOTE: This medicine is only for you. Do not share this medicine with others. What if I miss a dose? Keep appointments for follow-up doses. It is important not to miss your dose. Call your care team if you are unable to keep an appointment. What may interact with this medication? Erythromycin Phenytoin This medication may affect how other medications work, and other medications may affect the way this medication works. Talk with your care team about all of the medications you take. They may suggest changes to your treatment plan to lower the risk of side effects and to make sure your medications work as intended. This list may not describe all possible interactions. Give your health care provider a list of all the medicines, herbs, non-prescription drugs, or dietary supplements you use. Also tell them if you  smoke, drink alcohol, or use illegal drugs. Some items may interact with your medicine. What should I watch for while using this medication? Your condition will be monitored carefully while you are receiving this medication. This medication may make you feel generally unwell. This is not uncommon as chemotherapy can affect healthy cells as well as cancer cells. Report any side effects. Continue your course of treatment even though you feel ill unless your care team tells you to stop. You may need blood work while taking this medication. This medication will cause constipation. If you do not have a bowel movement for 3 days, call your care team. This medication may increase your risk to bruise or bleed. Call your care team if you notice any unusual bleeding. This medication may increase your risk of getting an infection. Call your care team for advice if you get a  fever, chills, sore throat, or other symptoms of a cold or flu. Do not treat yourself. Try to avoid being around people who are sick. Be careful brushing or flossing your teeth or using a toothpick because you may get an infection or bleed more easily. If you have any dental work done, tell your dentist you are receiving this medication. Talk to your care team if you or your partner wish to become pregnant or think either of you might be pregnant. This medication can cause serious birth defects. This medication may cause infertility. Talk to your care team if you are concerned about your fertility. Talk to your care team before breastfeeding. Changes to your treatment plan may be needed. What side effects may I notice from receiving this medication? Side effects that you should report to your care team as soon as possible: Allergic reactions--skin rash, itching, hives, swelling of the face, lips, tongue, or throat Infection--fever, chills, cough, sore throat, wounds that don't heal, pain or trouble when passing urine, general feeling of discomfort or being unwell Painful swelling, warmth, or redness of the skin, blisters or sores at the infusion site Shortness of breath or trouble breathing Unusual bruising or bleeding Side effects that usually do not require medical attention (report to your care team if they continue or are bothersome): Bone pain Constipation Hair loss Nausea Stomach pain Vomiting This list may not describe all possible side effects. Call your doctor for medical advice about side effects. You may report side effects to FDA at 1-800-FDA-1088. Where should I keep my medication? This medication is given in a hospital or clinic. It will not be stored at home. NOTE: This sheet is a summary. It may not cover all possible information. If you have questions about this medicine, talk to your doctor, pharmacist, or health care provider.  2023 Elsevier/Gold Standard (2021-04-19  00:00:00) Dacarbazine Injection What is this medication? DACARBAZINE (da KAR ba zeen) treats skin cancer and lymphoma. It works by slowing down the growth of cancer cells. This medicine may be used for other purposes; ask your health care provider or pharmacist if you have questions. COMMON BRAND NAME(S): DTIC-Dome What should I tell my care team before I take this medication? They need to know if you have any of these conditions: Infection, such as chickenpox, cold sores, herpes Kidney disease Liver disease Low blood cell levels, such as low white cells, platelets, or red blood cells Recent radiation therapy An unusual or allergic reaction to dacarbazine, other medications, foods, dyes, or preservatives Pregnant or trying to get pregnant Breast-feeding How should I use this medication? This medication is given  as an injected into a vein. It is given by your care team in a hospital or clinic setting. Talk to your care team about the use of this medication in children. While it may be prescribed for selected conditions, precautions do apply. Overdosage: If you think you have taken too much of this medicine contact a poison control center or emergency room at once. NOTE: This medicine is only for you. Do not share this medicine with others. What if I miss a dose? Keep appointments for follow-up doses. It is important not to miss your dose. Call your care team if you are unable to keep an appointment. What may interact with this medication? Do not take this medication with any of the following: Live virus vaccines This medication may also interact with the following: Medications to increase blood counts, such as filgrastim, pegfilgrastim, sargramostim This list may not describe all possible interactions. Give your health care provider a list of all the medicines, herbs, non-prescription drugs, or dietary supplements you use. Also tell them if you smoke, drink alcohol, or use illegal drugs.  Some items may interact with your medicine. What should I watch for while using this medication? Your condition will be monitored carefully while you are receiving this medication. You may need blood work done while taking this medication. This medication may make you feel generally unwell. This is not uncommon as chemotherapy can affect healthy cells as well as cancer cells. Report any side effects. Continue your course of treatment even though you feel ill unless your care team tells you to stop. This medication may increase your risk of getting an infection. Call your care team for advice if you get a fever, chills, sore throat, or other symptoms of a cold or flu. Do not treat yourself. Try to avoid being around people who are sick. This medication may increase your risk to bruise or bleed. Call your care team if you notice any unusual bleeding. Talk to your care team about your risk of cancer. You may be more at risk for certain types of cancers if you take this medication. Talk to your care team if you wish to become pregnant or think you might be pregnant. This medication can cause serious birth defects if taken during pregnancy. A reliable form of contraception is recommended while taking this medication. Talk to your care team about effective forms of contraception. Do not breastfeed while taking this medication. What side effects may I notice from receiving this medication? Side effects that you should report to your care team as soon as possible: Allergic reactions--skin rash, itching, hives, swelling of the face, lips, tongue, or throat Infection--fever, chills, cough, sore throat, wounds that don't heal, pain or trouble when passing urine, general feeling of discomfort or being unwell Liver injury--right upper belly pain, loss of appetite, nausea, light-colored stool, dark yellow or brown urine, yellowing skin or eyes, unusual weakness or fatigue Low red blood cell level--unusual weakness  or fatigue, dizziness, headache, trouble breathing Painful swelling, warmth, or redness of the skin, blisters or sores at the infusion site Unusual bruising or bleeding Side effects that usually do not require medical attention (report to your care team if they continue or are bothersome): Hair loss Loss of appetite Nausea Vomiting This list may not describe all possible side effects. Call your doctor for medical advice about side effects. You may report side effects to FDA at 1-800-FDA-1088. Where should I keep my medication? This medication is given in a hospital or  clinic. It will not be stored at home. NOTE: This sheet is a summary. It may not cover all possible information. If you have questions about this medicine, talk to your doctor, pharmacist, or health care provider.  2023 Elsevier/Gold Standard (2021-05-20 00:00:00)

## 2022-01-17 NOTE — Progress Notes (Signed)
    DATE:  01/17/22                                        X MEDICATION REACTION           MD: Lorenso Courier   AGENT/BLOOD PRODUCT RECEIVING TODAY:              AVD   AGENT/BLOOD PRODUCT RECEIVING IMMEDIATELY PRIOR TO REACTION:          Emend   VS: BP:     162/77   P:       66       SPO2:       100 % RA                BP:     149/70   P:       69       SPO2:       100% RA     REACTION(S):           back pain   PREMEDS:     Aloxi 0.25 mg, Emend 162m IV, Decadron 10 mg IV   INTERVENTION: Pepcid 20 mg IV, solu-medrol 125 mg IV   Review of Systems  Review of Systems  Musculoskeletal:  Positive for back pain.  All other systems reviewed and are negative.    Physical Exam  Physical Exam Vitals and nursing note reviewed.  Constitutional:      Appearance: She is well-developed. She is not ill-appearing or toxic-appearing.  HENT:     Head: Normocephalic.     Nose: Nose normal.  Eyes:     Conjunctiva/sclera: Conjunctivae normal.  Neck:     Vascular: No JVD.  Cardiovascular:     Rate and Rhythm: Normal rate and regular rhythm.     Pulses: Normal pulses.     Heart sounds: Normal heart sounds.  Pulmonary:     Effort: Pulmonary effort is normal.     Breath sounds: Normal breath sounds.  Abdominal:     General: There is no distension.  Musculoskeletal:     Cervical back: Normal range of motion.  Skin:    General: Skin is warm and dry.  Neurological:     Mental Status: She is oriented to person, place, and time.     Comments: Sensation grossly intact to light touch in the lower extremities bilaterally. No saddle anesthesias.        OUTCOME:                Patient experienced reaction to premedication Emend.  Back pain resolved after emergency medications given as documented above.  Patient returned to baseline and was able to proceed with treatment.  Oncologist made aware.

## 2022-01-17 NOTE — Progress Notes (Signed)
Hypersensitivity Reaction note  Date of event: 01/17/22 Time of event: 1340 Generic name of drug involved: Emend Name of provider notified of the hypersensitivity reaction: Mauri [place], Lorenso Courier MD, [place] PA.  Was agent that likely caused hypersensitivity reaction added to Allergies List within EMR? Yes. Chain of events including reaction signs/symptoms, treatment administered, and outcome (e.g., drug resumed; drug discontinued; sent to Emergency Department; etc.)   A few seconds after starting first-time Emend, pt began complaining of shooting 10/10 back pain. Emend infusion paused. 1L NS hung to gravity. VS taken. Kaitlyn PA came to bedside to assess pt. Pepcid 41m given at 1342. 124mSolu-medrol given at 1343. Pt reported swift resolution of back pain. MDs notified by PA of reaction.   QuRafael BihariRN 01/17/2022 2:09 PM

## 2022-01-18 ENCOUNTER — Encounter: Payer: Self-pay | Admitting: Hematology and Oncology

## 2022-01-18 NOTE — Progress Notes (Signed)
[place] [phone]) [phone]   [phone]  PROGRESS NOTE  Patient Care Team: Pcp, No as PCP - General [name], MD (Internal Medicine)  Hematological/Oncological History # Hodkgin's Lymphoma, Stage III 12/09/2021: CT Abdomen/Pelvis showed retroperitoneal adenopathy, with 2.1 x 3.6 cm node 12/20/2021: establish care with [facility]  12/27/2021: CT abdominal mass biopsy performed, findings consistent with classical Hodgkin's lymphoma. 01/05/2022: PET CT scan showed hypermetabolic abdominal adenopathy consistent with lymphoma.  She has mild hypermetabolism involving the mediastinal and bilateral axillary nodes. 01/17/2022: Cycle 1, Day 1 of AVD chemotherapy  Interval History:  Heather Petty 71 y.o. female with medical history significant for newly diagnosed Hodgkin's lymphoma who presents for a follow up visit. The patient's last visit was on 01/04/2022. In the interim since the last visit she denies any changes to her health.  On exam today Heather Petty is unaccompanied for this visit. She reports having stable fatigue and not sleeping well due to anxiety of treatment. She has occasional episodes of nausea mainly in the morning without vomiting. She denies abdominal pain. She has some constipation but takes miralax as needed. Her last bowel movement was earlier today. She denies easy bruising or signs of active bleeding. She denies fevers, chills, sweats, shortness of breath, chest pain or cough. She has no other complaints.  A full 10 point ROS is otherwise negative.  MEDICAL HISTORY:  Past Medical History:  Diagnosis Date   AKI (acute kidney injury) ([place]) 12/17/2017   Allergic rhinitis    Anemia    Arthritis    Asthma    Autonomic neuropathy    Depression    Diabetes mellitus    Diabetes mellitus without complication (HCC)    E coli infection    GERD (gastroesophageal reflux disease)    GERD (gastroesophageal reflux disease)    Gout     Hepatitis A    High cholesterol    Hypertension    Insomnia    Kidney failure    Metabolic encephalopathy    Morbid obesity (HCC)    NASH (nonalcoholic steatohepatitis)    Pneumonia 12/17/2017   Schizophrenia ([place])    Stroke ([place])    Vitamin B deficiency    Vitamin D deficiency     SURGICAL HISTORY: Past Surgical History:  Procedure Laterality Date   CYST EXCISION     buttucks   DENTAL SURGERY     IR IMAGING GUIDED PORT INSERTION  01/10/2022   TONSILLECTOMY      SOCIAL HISTORY: Social History   Socioeconomic History   Marital status: Unknown    Spouse name: Not on file   Number of children: 2   Years of education: Not on file   Highest education level: Not on file  Occupational History   Occupation: retired/disabled  Tobacco Use   Smoking status: Never   Smokeless tobacco: Never  Vaping Use   Vaping Use: Never used  Substance and Sexual Activity   Alcohol use: Never   Drug use: Never   Sexual activity: Not on file  Other Topics Concern   Not on file  Social History Narrative   ** Merged History Encounter **       Social Determinants of Health   Financial Resource Strain: Not on file  Food Insecurity: Not on file  Transportation Needs: Not on file  Physical Activity: Not on file  Stress: Not on file  Social Connections: Not on file  Intimate Partner Violence: Not on file    FAMILY  HISTORY: Family History  Adopted: Yes  Problem Relation Age of Onset   COPD Daughter     ALLERGIES:  is allergic to [facility] [aprepitant], vancomycin, lasix [furosemide], and lasix [furosemide].  MEDICATIONS:  Current Outpatient Medications  Medication Sig Dispense Refill   albuterol (VENTOLIN HFA) 108 (90 Base) MCG/ACT inhaler Inhale 2 puffs into the lungs every 4 (four) hours as needed for wheezing or shortness of breath.     allopurinol (ZYLOPRIM) 300 MG tablet Take 300 mg by mouth daily.     amLODipine (NORVASC) 10 MG tablet Take 10 mg by mouth daily.     ammonium  lactate (LAC-HYDRIN) 12 % lotion Apply 1 application  topically daily as needed for dry skin.     carvedilol (COREG) 6.25 MG tablet Take 6.25 mg by mouth 2 (two) times daily with a meal.     Dextromethorphan-guaiFENesin 5-100 MG/5ML LIQD Take 10 mLs by mouth every 6 (six) hours as needed.     doxepin (SINEQUAN) 25 MG capsule Take 25 mg by mouth at bedtime.     famotidine (PEPCID) 20 MG tablet Take 20 mg by mouth 2 (two) times daily.     glipiZIDE (GLUCOTROL XL) 10 MG 24 hr tablet Take 10 mg by mouth 2 (two) times daily.      HYDROcodone-acetaminophen (NORCO/VICODIN) 5-325 MG tablet Take 1 tablet by mouth 2 (two) times daily.     hydrOXYzine (ATARAX/VISTARIL) 25 MG tablet Take 25 mg by mouth in the morning and at bedtime.     insulin aspart (NOVOLOG) 100 UNIT/ML injection Inject 0-12 Units into the skin See admin instructions. Inject 0-12 units into the skin 4 times a day (before meals and at bedtime) per sliding scale: BGL <60 or >400  = CALL MD; 60-200 = 0 units; 201-250 = 2 units; 251-300 = 4 units; 301-350 = 6 units; 351-400 = 8 units; 401-450 = 10 units; >450 = 12 units     insulin glargine (LANTUS) 100 UNIT/ML injection Inject 0.1 mLs (10 Units total) into the skin at bedtime. (Patient taking differently: Inject 22 Units into the skin at bedtime.)     lidocaine (LIDODERM) 5 % Place 1 patch onto the skin daily. Remove & Discard patch within 12 hours or as directed by MD     lidocaine-prilocaine (EMLA) cream Apply 1 Application topically as needed. 30 g 0   lisinopril (ZESTRIL) 5 MG tablet Take 5 mg by mouth daily.     loperamide (IMODIUM) 2 MG capsule Take 1 capsule (2 mg total) by mouth as needed for diarrhea or loose stools. 30 capsule 0   Polyethyl Glycol-Propyl Glycol (SYSTANE ULTRA) 0.4-0.3 % SOLN Place 2 drops into both eyes in the morning and at bedtime.     polyethylene glycol (MIRALAX / GLYCOLAX) 17 g packet Take 17 g by mouth daily as needed for mild constipation.     prochlorperazine  (COMPAZINE) 10 MG tablet Take 1 tablet (10 mg total) by mouth every 6 (six) hours as needed for nausea or vomiting. 30 tablet 0   risperiDONE (RISPERDAL) 0.5 MG tablet Take 0.5 mg by mouth daily.     Semaglutide,0.25 or 0.5MG/DOS, (OZEMPIC, 0.25 OR 0.5 MG/DOSE,) 2 MG/1.5ML SOPN Inject 0.5 mg into the skin once a week. On Sunday     triamcinolone (NASACORT ALLERGY 24HR) 55 MCG/ACT AERO nasal inhaler Place 1 spray into the nose daily.     No current facility-administered medications for this visit.    REVIEW OF SYSTEMS:  Constitutional: ( - ) fevers, ( - )  chills , ( - ) night sweats Eyes: ( - ) blurriness of vision, ( - ) double vision, ( - ) watery eyes Ears, nose, mouth, throat, and face: ( - ) mucositis, ( - ) sore throat Respiratory: ( - ) cough, ( - ) dyspnea, ( - ) wheezes Cardiovascular: ( - ) palpitation, ( - ) chest discomfort, ( - ) lower extremity swelling Gastrointestinal:  ( +) nausea, ( - ) heartburn, ( - ) change in bowel habits Skin: ( - ) abnormal skin rashes Lymphatics: ( - ) new lymphadenopathy, ( - ) easy bruising Neurological: ( - ) numbness, ( - ) tingling, ( - ) new weaknesses Behavioral/Psych: ( - ) mood change, ( - ) new changes  All other systems were reviewed with the patient and are negative.  PHYSICAL EXAMINATION: ECOG PERFORMANCE STATUS: 2 - Symptomatic, <50% confined to bed  Vitals:   01/17/22 1217  BP: (!) 150/67  Pulse: 66  Resp: 15  Temp: 97.9 F (36.6 C)  SpO2: 95%   There were no vitals filed for this visit.   GENERAL: Well-appearing elderly female, alert, no distress and comfortable SKIN: skin color, texture, turgor are normal, no rashes or significant lesions EYES: conjunctiva are pink and non-injected, sclera clear LUNGS: clear to auscultation and percussion with normal breathing effort HEART: regular rate & rhythm and no murmurs and no lower extremity edema Musculoskeletal: no cyanosis of digits and no clubbing  PSYCH: alert &  oriented x 3, fluent speech NEURO: no focal motor/sensory deficits  LABORATORY DATA:  I have reviewed the data as listed    Latest Ref Rng & Units 01/17/2022   11:56 AM 01/04/2022    2:56 PM 12/27/2021    9:32 AM  CBC  WBC 4.0 - 10.5 K/uL 9.9  10.3  8.3   Hemoglobin 12.0 - 15.0 g/dL 12.5  13.1  13.9   Hematocrit 36.0 - 46.0 % 37.3  39.5  42.6   Platelets 150 - 400 K/uL 154  172  145        Latest Ref Rng & Units 01/17/2022   11:56 AM 01/04/2022    2:56 PM 12/09/2021    5:23 PM  CMP  Glucose 70 - 99 mg/dL 292  83  156   BUN 8 - 23 mg/dL 16  13  12    Creatinine 0.44 - 1.00 mg/dL 0.78  0.77  0.78   Sodium 135 - 145 mmol/L 137  141  139   Potassium 3.5 - 5.1 mmol/L 3.7  3.6  3.4   Chloride 98 - 111 mmol/L 104  108  109   CO2 22 - 32 mmol/L 26  26  24    Calcium 8.9 - 10.3 mg/dL 9.4  9.3  8.7   Total Protein 6.5 - 8.1 g/dL 6.6  7.4  6.2   Total Bilirubin 0.3 - 1.2 mg/dL 0.4  0.5  0.6   Alkaline Phos 38 - 126 U/L 140  131  128   AST 15 - 41 U/L 12  16  14    ALT 0 - 44 U/L 14  15  11      RADIOGRAPHIC STUDIES: IR IMAGING GUIDED PORT INSERTION  Result Date: 01/10/2022 CLINICAL DATA:  Classical Hodgkin's lymphoma and need for porta cath for chemotherapy. EXAM: IMPLANTED PORT A CATH PLACEMENT WITH ULTRASOUND AND FLUOROSCOPIC GUIDANCE ANESTHESIA/SEDATION: Moderate (conscious) sedation was employed during this procedure. A total of Versed 2.0  mg and Fentanyl 50 mcg was administered intravenously. Moderate Sedation Time: 31 minutes. The patient's level of consciousness and vital signs were monitored continuously by radiology nursing throughout the procedure under my direct supervision. FLUOROSCOPY: 30 seconds.  2.0 mGy. PROCEDURE: The procedure, risks, benefits, and alternatives were explained to the patient. Questions regarding the procedure were encouraged and answered. The patient understands and consents to the procedure. A time-out was performed prior to initiating the procedure.  Ultrasound was utilized to confirm patency of the right internal jugular vein. A permanent ultrasound image was saved and recorded. The right neck and chest were prepped with chlorhexidine in a sterile fashion, and a sterile drape was applied covering the operative field. Maximum barrier sterile technique with sterile gowns and gloves were used for the procedure. Local anesthesia was provided with 1% lidocaine. After creating a small venotomy incision, a 21 gauge needle was advanced into the right internal jugular vein under direct, real-time ultrasound guidance. Ultrasound image documentation was performed. After securing guidewire access, an 8 Fr dilator was placed. A J-wire was kinked to measure appropriate catheter length. A subcutaneous port pocket was then created along the upper chest wall utilizing sharp and blunt dissection. Portable cautery was utilized. The pocket was irrigated with sterile saline. A single lumen power injectable port was chosen for placement. The 8 Fr catheter was tunneled from the port pocket site to the venotomy incision. The port was placed in the pocket. External catheter was trimmed to appropriate length based on guidewire measurement. At the venotomy, an 8 Fr peel-away sheath was placed over a guidewire. The catheter was then placed through the sheath and the sheath removed. Final catheter positioning was confirmed and documented with a fluoroscopic spot image. The port was accessed with a needle and aspirated and flushed with heparinized saline. The access needle was removed. The venotomy and port pocket incisions were closed with subcutaneous 3-0 Monocryl and subcuticular 4-0 Vicryl. Dermabond was applied to both incisions. COMPLICATIONS: COMPLICATIONS None FINDINGS: After catheter placement, the tip lies at the cavo-atrial junction. The catheter aspirates normally and is ready for immediate use. IMPRESSION: Placement of single lumen port a cath via right internal jugular vein.  The catheter tip lies at the cavo-atrial junction. A power injectable port a cath was placed and is ready for immediate use. Electronically Signed   By: Aletta Edouard M.D.   On: 01/10/2022 16:37   ECHOCARDIOGRAM COMPLETE  Result Date: 01/08/2022    ECHOCARDIOGRAM REPORT   Patient Name:   Heather Petty Date of Exam: 01/06/2022 Medical Rec #:  [id number]        Height:       61.0 in Accession #:    [id number]       Weight:       221.3 lb Date of Birth:  March 10, 1950        BSA:          1.972 m Patient Age:    25 years         BP:           135/74 mmHg Patient Gender: F                HR:           65 bpm. Exam Location:  Outpatient Procedure: 2D Echo and Strain Analysis Indications:    Chemo  History:        Patient has prior history of Echocardiogram examinations, most  recent 09/04/2019. Risk Factors:Hypertension.  Sonographer:    Harvie Junior Referring Phys: [id number] Emile Kyllo T Latonia Conrow  Sonographer Comments: Technically difficult study due to poor echo windows and patient is obese. Image acquisition challenging due to patient body habitus. Global longitudinal strain was attempted. IMPRESSIONS  1. Left ventricular ejection fraction, by estimation, is 60 to 65%. The left ventricle has normal function. The left ventricle has no regional wall motion abnormalities. Left ventricular diastolic parameters are consistent with Grade I diastolic dysfunction (impaired relaxation). The average left ventricular global longitudinal strain is -19.1 %. The global longitudinal strain is normal.  2. Right ventricular systolic function is normal. The right ventricular size is normal. There is normal pulmonary artery systolic pressure.  3. The mitral valve is normal in structure. No evidence of mitral valve regurgitation. No evidence of mitral stenosis.  4. The aortic valve was not well visualized. Aortic valve regurgitation is not visualized. No aortic stenosis is present.  5. The inferior vena cava is normal in size  with greater than 50% respiratory variability, suggesting right atrial pressure of 3 mmHg. FINDINGS  Left Ventricle: Left ventricular ejection fraction, by estimation, is 60 to 65%. The left ventricle has normal function. The left ventricle has no regional wall motion abnormalities. The average left ventricular global longitudinal strain is -19.1 %. The global longitudinal strain is normal. The left ventricular internal cavity size was normal in size. There is no left ventricular hypertrophy. Left ventricular diastolic parameters are consistent with Grade I diastolic dysfunction (impaired relaxation). Indeterminate filling pressures. Right Ventricle: The right ventricular size is normal. No increase in right ventricular wall thickness. Right ventricular systolic function is normal. There is normal pulmonary artery systolic pressure. The tricuspid regurgitant velocity is 2.54 m/s, and  with an assumed right atrial pressure of 3 mmHg, the estimated right ventricular systolic pressure is 69.4 mmHg. Left Atrium: Left atrial size was normal in size. Right Atrium: Right atrial size was normal in size. Pericardium: There is no evidence of pericardial effusion. Mitral Valve: The mitral valve is normal in structure. No evidence of mitral valve regurgitation. No evidence of mitral valve stenosis. Tricuspid Valve: The tricuspid valve is normal in structure. Tricuspid valve regurgitation is trivial. No evidence of tricuspid stenosis. Aortic Valve: The aortic valve was not well visualized. Aortic valve regurgitation is not visualized. No aortic stenosis is present. Aortic valve mean gradient measures 3.0 mmHg. Aortic valve peak gradient measures 5.9 mmHg. Aortic valve area, by VTI measures 2.51 cm. Pulmonic Valve: The pulmonic valve was normal in structure. Pulmonic valve regurgitation is not visualized. No evidence of pulmonic stenosis. Aorta: The aortic root is normal in size and structure. Venous: The inferior vena cava is  normal in size with greater than 50% respiratory variability, suggesting right atrial pressure of 3 mmHg. IAS/Shunts: No atrial level shunt detected by color flow Doppler.  LEFT VENTRICLE PLAX 2D LVIDd:         4.50 cm     Diastology LVIDs:         2.90 cm     LV e' medial:    6.74 cm/s LV PW:         0.90 cm     LV E/e' medial:  13.9 LV IVS:        0.90 cm     LV e' lateral:   11.70 cm/s LVOT diam:     1.90 cm     LV E/e' lateral: 8.0 LV SV:  66 LV SV Index:   34          2D Longitudinal Strain LVOT Area:     2.84 cm    2D Strain GLS (A2C):   -18.9 %                            2D Strain GLS (A3C):   -19.9 %                            2D Strain GLS (A4C):   -18.5 % LV Volumes (MOD)           2D Strain GLS Avg:     -19.1 % LV vol d, MOD A2C: 62.8 ml LV vol d, MOD A4C: 74.7 ml LV vol s, MOD A2C: 24.0 ml LV vol s, MOD A4C: 28.2 ml 3D Volume EF: LV SV MOD A2C:     38.8 ml 3D EF:        73 % LV SV MOD A4C:     74.7 ml LV EDV:       205 ml LV SV MOD BP:      41.9 ml LV ESV:       55 ml                            LV SV:        150 ml RIGHT VENTRICLE RV Basal diam:  3.20 cm RV Mid diam:    3.00 cm RV S prime:     12.90 cm/s TAPSE (M-mode): 1.7 cm LEFT ATRIUM           Index        RIGHT ATRIUM          Index LA diam:      2.70 cm 1.37 cm/m   RA Area:     8.79 cm LA Vol (A2C): 31.5 ml 15.98 ml/m  RA Volume:   15.60 ml 7.91 ml/m LA Vol (A4C): 26.5 ml 13.44 ml/m  AORTIC VALVE                    PULMONIC VALVE AV Area (Vmax):    2.51 cm     PV Vmax:       1.10 m/s AV Area (Vmean):   2.54 cm     PV Peak grad:  4.8 mmHg AV Area (VTI):     2.51 cm AV Vmax:           121.00 cm/s AV Vmean:          79.300 cm/s AV VTI:            0.264 m AV Peak Grad:      5.9 mmHg AV Mean Grad:      3.0 mmHg LVOT Vmax:         107.00 cm/s LVOT Vmean:        71.000 cm/s LVOT VTI:          0.234 m LVOT/AV VTI ratio: 0.89  AORTA Ao Root diam: 2.60 cm Ao Asc diam:  2.70 cm MITRAL VALVE                TRICUSPID VALVE MV Area (PHT): 3.31  cm     TR Peak grad:   25.8 mmHg MV Decel Time: 229 msec     TR  Vmax:        254.00 cm/s MR Peak grad: 47.1 mmHg MR Vmax:      343.00 cm/s   SHUNTS MV E velocity: 93.80 cm/s   Systemic VTI:  0.23 m MV A velocity: 108.00 cm/s  Systemic Diam: 1.90 cm MV E/A ratio:  0.87 Skeet Latch MD Electronically signed by Skeet Latch MD Signature Date/Time: 01/08/2022/5:24:10 PM    Final    NM PET Image Initial (PI) Skull Base To Thigh  Result Date: 01/06/2022 CLINICAL DATA:  Initial treatment strategy for new diagnosis of Hodgkin's lymphoma. EXAM: NUCLEAR MEDICINE PET SKULL BASE TO THIGH TECHNIQUE: 10.9 mCi F-18 FDG was injected intravenously. Full-ring PET imaging was performed from the skull base to thigh after the radiotracer. CT data was obtained and used for attenuation correction and anatomic localization. Fasting blood glucose: 144 mg/dl COMPARISON:  12/09/2021 abdominopelvic CT. Most recent chest CT 07/21/2019 FINDINGS: Mild limitations secondary to patient body habitus. Mediastinal blood pool activity: SUV max 3.6 Liver activity: SUV max 4.9 NECK: No areas of abnormal hypermetabolism. Incidental CT findings: No cervical adenopathy. Bilateral carotid atherosclerosis. CHEST: No pulmonary parenchymal hypermetabolism. Bilateral small axillary nodes with low-level nonspecific hypermetabolism. Example left axillary node at 8 mm and a S.U.V. max of 2.5 on 72/4. An AP window node measures 7 mm and a S.U.V. max of 3.4 on 64/4. Incidental CT findings: Mild cardiomegaly. Aortic and coronary artery calcification. Right lower lobe pulmonary nodules on the order of 3-4 mm, including on 75/4, were present in 2021 and considered benign. ABDOMEN/PELVIS: A portacaval node measures 2.4 x 1.7 cm and a S.U.V. max of 6.4 on 110/4. Hypermetabolism corresponding to the abdominal retroperitoneal bulky adenopathy. Index nodal mass measures 3.9 x 2.7 cm and a S.U.V. max of 10.5 on 121/4. No pelvic nodal hypermetabolism.  No splenic  hypermetabolism. Sigmoid colonic hypermetabolism corresponds to extensive diverticulosis and possible diverticulitis. Example at a S.U.V. max of 8.8 on 162/4. Incidental CT findings: Deferred to recent diagnostic CT. Dependent gallstones. Moderate hepatic steatosis. Caudate and lateral segment left liver lobe enlargement. Normal adrenal glands. Pelvic floor laxity. SKELETON: No marrow hypermetabolism. Left iliacus tendon hypermetabolism suggests strain including on 168/4. Incidental CT findings: Osteopenia. IMPRESSION: 1. Hypermetabolic abdominal adenopathy, consistent with lymphoma. ([place]) 5 2. Mild hypermetabolism involving mediastinal and bilateral axillary nodes, equivocal but favored to represent active lymphoma. 3. Sigmoid colonic focal hypermetabolism in the setting of diverticulosis and possible diverticulitis. Correlate with colon cancer screening history, as underlying polyp or mass cannot be excluded. 4. Hepatic steatosis. Hepatic morphology for which mild cirrhosis cannot be excluded. 5. Mild limitations secondary to patient body habitus. 6. Incidental findings, including: Coronary artery atherosclerosis. Aortic Atherosclerosis (ICD10-I70.0). Cholelithiasis. Electronically Signed   By: Abigail Miyamoto M.D.   On: 01/06/2022 08:28   CT ABDOMINAL MASS BIOPSY  Result Date: 12/27/2021 INDICATION: 71 year old with abdominal lymphadenopathy. Concern for lymphoproliferative disorder. EXAM: CT-GUIDED LEFT RETROPERITONEAL LYMPH NODE BIOPSY MEDICATIONS: Moderate sedation ANESTHESIA/SEDATION: Moderate (conscious) sedation was employed during this procedure. A total of Versed 1.0 mg and Fentanyl 50 mcg was administered intravenously by the radiology nurse. Total intra-service moderate Sedation Time: 28 minutes. The patient's level of consciousness and vital signs were monitored continuously by radiology nursing throughout the procedure under my direct supervision. FLUOROSCOPY TIME:  None COMPLICATIONS: None  immediate. PROCEDURE: Informed written consent was obtained from the patient after a thorough discussion of the procedural risks, benefits and alternatives. All questions were addressed. A timeout was performed prior to the initiation of  the procedure. Patient was placed prone. CT images through the abdomen were obtained. Enlarged left periaortic lymph nodes were targeted. Left side of the back was prepped with chlorhexidine and sterile field was created. Skin was anesthetized using 1% lidocaine. Small incision was made. Using CT guidance, 17 gauge coaxial needle was directed into the enlarged left periaortic lymph node. Needle was placed within the posterior aspect of the enlarged lymph node. Three 18 gauge core biopsies were obtained. Specimens placed on a Telfa pad with saline. 17 gauge needle was removed. Follow up CT images were obtained. Bandage placed over the puncture site. RADIATION DOSE REDUCTION: This exam was performed according to the departmental dose-optimization program which includes automated exposure control, adjustment of the mA and/or kV according to patient size and/or use of iterative reconstruction technique. FINDINGS: Enlarged left retroperitoneal lymph nodes. Biopsy needle was confirmed within an enlarged left periaortic lymph node. Three adequate specimens obtained. No immediate bleeding or hematoma formation. IMPRESSION: Successful CT-guided core biopsy of an enlarged left retroperitoneal lymph node. Electronically Signed   By: Markus Daft M.D.   On: 12/27/2021 12:23    ASSESSMENT & PLAN Heather Petty 71 y.o. female with medical history significant for newly diagnosed Hodgkin's lymphoma who presents for a follow up visit.   #Hodgkin's Lymphoma, Stage III --biopsy of abdominal lymph node on 12/27/2021 showed classical Hodgkin's lymphoma.  --Pre-treatment PET scan from [facility] 5 abdominal adenopathy along with mild hypermetabolism involving mediastinal and bilateral axillary  nodes. --Recommend AVD chemotherapy x 2 cycles followed by PET CT scan.  PLAN: --Due to start Cycle 1, Day 1 of AVD chemotherapy --Labs from today were reviewed and adequate for treatment.  --Proceed with treatment today without any dose modifications --RTC in two weeks with labs, follow up visit with Dr. Lorenso Courier before Cycle 1, Day 65 of AVD chemotherapy.   #Sigmoid colonic focal hypermetabolism #Hepatic steatosis with possible cirrhosis --Will request follow up with GI to further evaluate.  #Supportive Care -- chemotherapy education complete -- port placement complete --baseline echo from 01/06/2022 showed EF 60-65% -- not planning for bleomycin, no need for PFTs.  --  compazine 52m PO q6H for nausea (zofran contraindicated due to QT prolongated medications).  -- allopurinol 3059mPO daily for TLS prophylaxis -- EMLA cream for port -- no pain medication required at this time.    No orders of the defined types were placed in this encounter.   All questions were answered. The patient knows to call the clinic with any problems, questions or concerns.  I have spent a total of 30 minutes minutes of face-to-face and non-face-to-face time, preparing to see the patient, performing a medically appropriate examination, counseling and educating the patient, ordering tests/procedures, communicating with other health care professionals, documenting clinical information in the electronic health record,  and care coordination.   IrDede QueryA-C Dept of Hematology and OnRaymondt WeMel[facility] Centerhone: [phone] 01/18/2022 7:09 AM

## 2022-01-19 ENCOUNTER — Telehealth: Payer: Self-pay

## 2022-01-19 NOTE — Telephone Encounter (Signed)
Heather Petty states that Heather Petty is doing fine.  She is eating, drinking, and urinating well.  She has some nausea this am and they gave her nausea medication with good effect.  She is currently at the Christmas party. The nurses know to call the office at [phone] if they have any questions or concerns.

## 2022-01-19 NOTE — Telephone Encounter (Signed)
-----   Message from Rafael Bihari, RN sent at 01/17/2022  3:49 PM EST ----- Regarding: Dr Lorenso Courier pt, first time Doxorubicin/Vinblastine/Dacarbazine. Dr Lorenso Courier pt came in 01/17/22 for first time Doxorubicin/Vinblastine/Dacarbazine. Reacted to Em[facility]. Tolerated remaining medications well. Needs call back.

## 2022-01-21 ENCOUNTER — Other Ambulatory Visit: Payer: Self-pay | Admitting: Hematology and Oncology

## 2022-01-25 ENCOUNTER — Other Ambulatory Visit: Payer: Self-pay

## 2022-01-26 ENCOUNTER — Encounter (HOSPITAL_COMMUNITY): Payer: Self-pay

## 2022-01-27 MED FILL — Dexamethasone Sodium Phosphate Inj 100 MG/10ML: INTRAMUSCULAR | Qty: 1 | Status: AC

## 2022-01-31 ENCOUNTER — Inpatient Hospital Stay: Payer: Medicare Other

## 2022-01-31 ENCOUNTER — Other Ambulatory Visit: Payer: Medicare Other

## 2022-01-31 ENCOUNTER — Inpatient Hospital Stay (HOSPITAL_BASED_OUTPATIENT_CLINIC_OR_DEPARTMENT_OTHER): Payer: Medicare Other | Admitting: Hematology and Oncology

## 2022-01-31 VITALS — BP 135/58 | HR 71 | Temp 98.5°F | Resp 14

## 2022-01-31 VITALS — BP 156/64 | HR 79 | Resp 16

## 2022-01-31 DIAGNOSIS — Z5111 Encounter for antineoplastic chemotherapy: Secondary | ICD-10-CM | POA: Diagnosis not present

## 2022-01-31 DIAGNOSIS — C8198 Hodgkin lymphoma, unspecified, lymph nodes of multiple sites: Secondary | ICD-10-CM

## 2022-01-31 LAB — CMP (CANCER CENTER ONLY)
ALT: 24 U/L (ref 0–44)
AST: 16 U/L (ref 15–41)
Albumin: 3.7 g/dL (ref 3.5–5.0)
Alkaline Phosphatase: 145 U/L — ABNORMAL HIGH (ref 38–126)
Anion gap: 8 (ref 5–15)
BUN: 15 mg/dL (ref 8–23)
CO2: 28 mmol/L (ref 22–32)
Calcium: 9.3 mg/dL (ref 8.9–10.3)
Chloride: 103 mmol/L (ref 98–111)
Creatinine: 0.86 mg/dL (ref 0.44–1.00)
GFR, Estimated: >60 mL/min (ref >60)
Glucose, Bld: 280 mg/dL — ABNORMAL HIGH (ref 70–99)
Potassium: 3.5 mmol/L (ref 3.5–5.1)
Sodium: 139 mmol/L (ref 135–145)
Total Bilirubin: 0.5 mg/dL (ref 0.3–1.2)
Total Protein: 6.6 g/dL (ref 6.5–8.1)

## 2022-01-31 LAB — CBC WITH DIFFERENTIAL (CANCER CENTER ONLY)
Abs Immature Granulocytes: 0.02 10*3/uL (ref 0.00–0.07)
Basophils Absolute: 0 10*3/uL (ref 0.0–0.1)
Basophils Relative: 1 %
Eosinophils Absolute: 0 10*3/uL (ref 0.0–0.5)
Eosinophils Relative: 2 %
HCT: 33.6 % — ABNORMAL LOW (ref 36.0–46.0)
Hemoglobin: 11.4 g/dL — ABNORMAL LOW (ref 12.0–15.0)
Immature Granulocytes: 1 %
Lymphocytes Relative: 48 %
Lymphs Abs: 1.3 10*3/uL (ref 0.7–4.0)
MCH: 30.4 pg (ref 26.0–34.0)
MCHC: 33.9 g/dL (ref 30.0–36.0)
MCV: 89.6 fL (ref 80.0–100.0)
Monocytes Absolute: 0.8 10*3/uL (ref 0.1–1.0)
Monocytes Relative: 30 %
Neutro Abs: 0.5 10*3/uL — ABNORMAL LOW (ref 1.7–7.7)
Neutrophils Relative %: 18 %
Platelet Count: 187 10*3/uL (ref 150–400)
RBC: 3.75 MIL/uL — ABNORMAL LOW (ref 3.87–5.11)
RDW: 13.3 % (ref 11.5–15.5)
WBC Count: 2.6 10*3/uL — ABNORMAL LOW (ref 4.0–10.5)
nRBC: 0 % (ref 0.0–0.2)

## 2022-01-31 MED ORDER — SODIUM CHLORIDE 0.9 % IV SOLN
375.0000 mg/m2 | Freq: Once | INTRAVENOUS | Status: AC
  Administered 2022-01-31: 780 mg via INTRAVENOUS
  Filled 2022-01-31: qty 78

## 2022-01-31 MED ORDER — HEPARIN SOD (PORK) LOCK FLUSH 100 UNIT/ML IV SOLN
500.0000 [IU] | Freq: Once | INTRAVENOUS | Status: AC | PRN
  Administered 2022-01-31: 500 [IU]

## 2022-01-31 MED ORDER — SODIUM CHLORIDE 0.9 % IV SOLN
10.0000 mg | Freq: Once | INTRAVENOUS | Status: AC
  Administered 2022-01-31: 10 mg via INTRAVENOUS
  Filled 2022-01-31: qty 10

## 2022-01-31 MED ORDER — SODIUM CHLORIDE 0.9% FLUSH
10.0000 mL | INTRAVENOUS | Status: DC | PRN
  Administered 2022-01-31: 10 mL

## 2022-01-31 MED ORDER — SODIUM CHLORIDE 0.9 % IV SOLN: Freq: Once | INTRAVENOUS | Status: AC

## 2022-01-31 MED ORDER — VINBLASTINE SULFATE CHEMO INJECTION 1 MG/ML
6.0000 mg/m2 | Freq: Once | INTRAVENOUS | Status: AC
  Administered 2022-01-31: 12.5 mg via INTRAVENOUS
  Filled 2022-01-31: qty 12.5

## 2022-01-31 MED ORDER — PALONOSETRON HCL INJECTION 0.25 MG/5ML
0.2500 mg | Freq: Once | INTRAVENOUS | Status: AC
  Administered 2022-01-31: 0.25 mg via INTRAVENOUS
  Filled 2022-01-31: qty 5

## 2022-01-31 MED ORDER — DOXORUBICIN HCL CHEMO IV INJECTION 2 MG/ML
25.0000 mg/m2 | Freq: Once | INTRAVENOUS | Status: AC
  Administered 2022-01-31: 52 mg via INTRAVENOUS
  Filled 2022-01-31: qty 26

## 2022-01-31 MED ORDER — SODIUM CHLORIDE 0.9% FLUSH
10.0000 mL | Freq: Once | INTRAVENOUS | Status: AC
  Administered 2022-01-31: 10 mL via INTRAVENOUS

## 2022-01-31 NOTE — Patient Instructions (Signed)
[facility]  Discharge Instructions: Thank you for choosing [facility] to provide your oncology and hematology care.   If you have a lab appointment with the [facility], please go directly to the [facility] and check in at the registration area.   Wear comfortable clothing and clothing appropriate for easy access to any Portacath or PICC line.   We strive to give you quality time with your provider. You may need to reschedule your appointment if you arrive late (15 or more minutes).  Arriving late affects you and other patients whose appointments are after yours.  Also, if you miss three or more appointments without notifying the office, you may be dismissed from the clinic at the provider's discretion.      For prescription refill requests, have your pharmacy contact our office and allow 72 hours for refills to be completed.    Today you received the following chemotherapy and/or immunotherapy agents: Doxorubicin, Vinblastine, Dacarbazine.       To help prevent nausea and vomiting after your treatment, we encourage you to take your nausea medication as directed.  BELOW ARE SYMPTOMS THAT SHOULD BE REPORTED IMMEDIATELY: *FEVER GREATER THAN 100.4 F (38 C) OR HIGHER *CHILLS OR SWEATING *NAUSEA AND VOMITING THAT IS NOT CONTROLLED WITH YOUR NAUSEA MEDICATION *UNUSUAL SHORTNESS OF BREATH *UNUSUAL BRUISING OR BLEEDING *URINARY PROBLEMS (pain or burning when urinating, or frequent urination) *BOWEL PROBLEMS (unusual diarrhea, constipation, pain near the anus) TENDERNESS IN MOUTH AND THROAT WITH OR WITHOUT PRESENCE OF ULCERS (sore throat, sores in mouth, or a toothache) UNUSUAL RASH, SWELLING OR PAIN  UNUSUAL VAGINAL DISCHARGE OR ITCHING   Items with * indicate a potential emergency and should be followed up as soon as possible or go to the Emergency Department if any problems should occur.  Please show the CHEMOTHERAPY ALERT CARD or  IMMUNOTHERAPY ALERT CARD at check-in to the Emergency Department and triage nurse.  Should you have questions after your visit or need to cancel or reschedule your appointment, please contact [facility]  Dept: [phone]  and follow the prompts.  Office hours are 8:00 a.m. to 4:30 p.m. Monday - Friday. Please note that voicemails left after 4:00 p.m. may not be returned until the following business day.  We are closed weekends and major holidays. You have access to a nurse at all times for urgent questions. Please call the main number to the clinic Dept: [phone] and follow the prompts.   For any non-urgent questions, you may also contact your provider using MyChart. We now offer e-Visits for anyone 63 and older to request care online for non-urgent symptoms. For details visit [email or web address].   Also download the MyChart app! Go to the app store, search "MyChart", open the app, select [facility], and log in with your MyChart username and password.  Masks are optional in the cancer centers. If you would like for your care team to wear a mask while they are taking care of you, please let them know. You may have one support person who is at least 73 years old accompany you for your appointments.

## 2022-01-31 NOTE — Progress Notes (Signed)
Per Dr. Lorenso Courier, ok to treat with ANC 0.5.

## 2022-01-31 NOTE — Progress Notes (Signed)
Heather [phone]) [phone]   [phone]) [phone]  PROGRESS NOTE  Patient Care Team: Pcp, No as PCP - General Heather Ebbs, MD (Internal Medicine)  Hematological/Oncological History # Hodkgin's Lymphoma, Stage III 12/09/2021: CT Abdomen/Pelvis showed retroperitoneal adenopathy, with 2.1 x 3.6 cm node 12/20/2021: establish care with [facility]  12/27/2021: CT abdominal mass biopsy performed, findings consistent with classical Hodgkin's lymphoma. 01/05/2022: PET CT scan showed hypermetabolic abdominal adenopathy consistent with lymphoma.  She has mild hypermetabolism involving the mediastinal and bilateral axillary nodes. 01/17/2022: Cycle 1, Day 1 of AVD chemotherapy 01/31/2022: Cycle 1, Day 15 of AVD chemotherapy  Interval History:  Heather Petty 71 y.o. female with medical history significant for newly diagnosed Hodgkin's lymphoma who presents for a follow up visit. The patient's last visit was on 01/10/2022. In the interim since the last visit she completed Cycle 1 Day 1 of AVD.   On exam today Heather Petty.  She notes that she is quite tired and Petty.  She Petty that her last chemo "got in her head" and she was out like a Petty".  She is disheartened by the fact that she is having thinning of her hair and some mild hair Petty.  She did have some nausea and vomiting during her last cycle but no diarrhea.  She tends to have more constipation symptoms.  She Petty that she did have some bouts of poor appetite where she "could not eat for nothing".  She Petty that she was taking her nausea medication and this was somewhat helpful.  She notes that she did have some pain in her "teeth and tongue" but no pain elsewhere.  She notes she is not having any difficulties with her port.  Overall she is willing and able to proceed with chemotherapy treatment at this time.  She denies fevers, chills, sweats, shortness of  breath, chest pain or cough. She has no other complaints.  A full 10 point ROS is otherwise negative.  MEDICAL HISTORY:  Past Medical History:  Diagnosis Date   AKI (acute kidney injury) ([place]) 12/17/2017   Allergic rhinitis    Anemia    Arthritis    Asthma    Autonomic neuropathy    Depression    Diabetes mellitus    Diabetes mellitus without complication (HCC)    E coli infection    GERD (gastroesophageal reflux disease)    GERD (gastroesophageal reflux disease)    Gout    Hepatitis A    High cholesterol    Hypertension    Insomnia    Kidney failure    Metabolic encephalopathy    Morbid obesity (HCC)    NASH (nonalcoholic steatohepatitis)    Pneumonia 12/17/2017   Schizophrenia ([place])    Stroke ([place])    Vitamin B deficiency    Vitamin D deficiency     SURGICAL HISTORY: Past Surgical History:  Procedure Laterality Date   CYST EXCISION     buttucks   DENTAL SURGERY     IR IMAGING GUIDED PORT INSERTION  01/10/2022   TONSILLECTOMY      SOCIAL HISTORY: Social History   Socioeconomic History   Marital status: Unknown    Spouse name: Not on file   Number of children: 2   Years of education: Not on file   Highest education level: Not on file  Occupational History   Occupation: retired/disabled  Tobacco Use   Smoking status: Never   Smokeless tobacco:  Never  Vaping Use   Vaping Use: Never used  Substance and Sexual Activity   Alcohol use: Never   Drug use: Never   Sexual activity: Not on file  Other Topics Concern   Not on file  Social History Narrative   ** Merged History Encounter **       Social Determinants of Health   Financial Resource Strain: Not on file  Food Insecurity: Not on file  Transportation Needs: Not on file  Physical Activity: Not on file  Stress: Not on file  Social Connections: Not on file  Intimate Partner Violence: Not on file    FAMILY HISTORY: Family History  Adopted: Yes  Problem Relation Age of Onset   COPD Daughter      ALLERGIES:  is allergic to [facility] [aprepitant], vancomycin, fosaprepitant, lasix [furosemide], and lasix [furosemide].  MEDICATIONS:  Current Outpatient Medications  Medication Sig Dispense Refill   albuterol (VENTOLIN HFA) 108 (90 Base) MCG/ACT inhaler Inhale 2 puffs into the lungs every 4 (four) hours as needed for wheezing or shortness of breath.     allopurinol (ZYLOPRIM) 300 MG tablet Take 300 mg by mouth daily.     amLODipine (NORVASC) 10 MG tablet Take 10 mg by mouth daily.     ammonium lactate (LAC-HYDRIN) 12 % lotion Apply 1 application  topically daily as needed for dry skin.     carvedilol (COREG) 6.25 MG tablet Take 6.25 mg by mouth 2 (two) times daily with a meal.     Dextromethorphan-guaiFENesin 5-100 MG/5ML LIQD Take 10 mLs by mouth every 6 (six) hours as needed.     doxepin (SINEQUAN) 25 MG capsule Take 25 mg by mouth at bedtime.     famotidine (PEPCID) 20 MG tablet Take 20 mg by mouth 2 (two) times daily.     glipiZIDE (GLUCOTROL XL) 10 MG 24 hr tablet Take 10 mg by mouth 2 (two) times daily.      HYDROcodone-acetaminophen (NORCO/VICODIN) 5-325 MG tablet Take 1 tablet by mouth 2 (two) times daily.     hydrOXYzine (ATARAX/VISTARIL) 25 MG tablet Take 25 mg by mouth in the morning and at bedtime.     insulin aspart (NOVOLOG) 100 UNIT/ML injection Inject 0-12 Units into the skin See admin instructions. Inject 0-12 units into the skin 4 times a day (before meals and at bedtime) per sliding scale: BGL <60 or >400  = CALL MD; 60-200 = 0 units; 201-250 = 2 units; 251-300 = 4 units; 301-350 = 6 units; 351-400 = 8 units; 401-450 = 10 units; >450 = 12 units     insulin glargine (LANTUS) 100 UNIT/ML injection Inject 0.1 mLs (10 Units total) into the skin at bedtime. (Patient taking differently: Inject 22 Units into the skin at bedtime.)     lidocaine (LIDODERM) 5 % Place 1 patch onto the skin daily. Remove & Discard patch within 12 hours or as directed by MD     lidocaine-prilocaine  (EMLA) cream Apply 1 Application topically as needed. 30 g 0   lisinopril (ZESTRIL) 5 MG tablet Take 5 mg by mouth daily.     loperamide (IMODIUM) 2 MG capsule Take 1 capsule (2 mg total) by mouth as needed for diarrhea or loose stools. 30 capsule 0   Polyethyl Glycol-Propyl Glycol (SYSTANE ULTRA) 0.4-0.3 % SOLN Place 2 drops into both eyes in the morning and at bedtime.     polyethylene glycol (MIRALAX / GLYCOLAX) 17 g packet Take 17 g by mouth daily as needed  for mild constipation.     prochlorperazine (COMPAZINE) 10 MG tablet Take 1 tablet (10 mg total) by mouth every 6 (six) hours as needed for nausea or vomiting. 30 tablet 0   risperiDONE (RISPERDAL) 0.5 MG tablet Take 0.5 mg by mouth daily.     Semaglutide,0.25 or 0.5MG/DOS, (OZEMPIC, 0.25 OR 0.5 MG/DOSE,) 2 MG/1.5ML SOPN Inject 0.5 mg into the skin once a week. On Sunday     triamcinolone (NASACORT ALLERGY 24HR) 55 MCG/ACT AERO nasal inhaler Place 1 spray into the nose daily.     No current facility-administered medications for this visit.   Facility-Administered Medications Ordered in Other Visits  Medication Dose Route Frequency Provider Last Rate Last Admin   sodium chloride flush (NS) 0.9 % injection 10 mL  10 mL Intracatheter PRN Orson Slick, MD   10 mL at 01/31/22 1339    REVIEW OF SYSTEMS:   Constitutional: ( - ) fevers, ( - )  chills , ( - ) night sweats Eyes: ( - ) blurriness of vision, ( - ) double vision, ( - ) watery eyes Ears, nose, mouth, throat, and face: ( - ) mucositis, ( - ) sore throat Respiratory: ( - ) cough, ( - ) dyspnea, ( - ) wheezes Cardiovascular: ( - ) palpitation, ( - ) chest discomfort, ( - ) lower extremity swelling Gastrointestinal:  ( +) nausea, ( - ) heartburn, ( - ) change in bowel habits Skin: ( - ) abnormal skin rashes Lymphatics: ( - ) new lymphadenopathy, ( - ) easy bruising Neurological: ( - ) numbness, ( - ) tingling, ( - ) new weaknesses Behavioral/Psych: ( - ) mood change, ( - ) new  changes  All other systems were reviewed with the patient and are negative.  PHYSICAL EXAMINATION: ECOG PERFORMANCE STATUS: 2 - Symptomatic, <50% confined to bed  Vitals:   01/31/22 0951  BP: (!) 135/58  Pulse: 71  Resp: 14  Temp: 98.5 F (36.9 C)  SpO2: 95%    Filed Weights     GENERAL: Well-appearing elderly female, alert, no distress and comfortable SKIN: skin color, texture, turgor are normal, no rashes or significant lesions EYES: conjunctiva are pink and non-injected, sclera clear LUNGS: clear to auscultation and percussion with normal breathing effort HEART: regular rate & rhythm and no murmurs and no lower extremity edema Musculoskeletal: no cyanosis of digits and no clubbing  PSYCH: alert & oriented x 3, fluent speech NEURO: no focal motor/sensory deficits  LABORATORY DATA:  I have reviewed the data as listed    Latest Ref Rng & Units 01/31/2022    9:19 AM 01/17/2022   11:56 AM 01/04/2022    2:56 PM  CBC  WBC 4.0 - 10.5 K/uL 2.6  9.9  10.3   Hemoglobin 12.0 - 15.0 g/dL 11.4  12.5  13.1   Hematocrit 36.0 - 46.0 % 33.6  37.3  39.5   Platelets 150 - 400 K/uL 187  154  172        Latest Ref Rng & Units 01/31/2022    9:19 AM 01/17/2022   11:56 AM 01/04/2022    2:56 PM  CMP  Glucose 70 - 99 mg/dL 280  292  83   BUN 8 - 23 mg/dL 15  16  13    Creatinine 0.44 - 1.00 mg/dL 0.86  0.78  0.77   Sodium 135 - 145 mmol/L 139  137  141   Potassium 3.5 - 5.1 mmol/L 3.5  3.7  3.6   Chloride 98 - 111 mmol/L 103  104  108   CO2 22 - 32 mmol/L 28  26  26    Calcium 8.9 - 10.3 mg/dL 9.3  9.4  9.3   Total Protein 6.5 - 8.1 g/dL 6.6  6.6  7.4   Total Bilirubin 0.3 - 1.2 mg/dL 0.5  0.4  0.5   Alkaline Phos 38 - 126 U/L 145  140  131   AST 15 - 41 U/L 16  12  16    ALT 0 - 44 U/L 24  14  15      RADIOGRAPHIC STUDIES: IR IMAGING GUIDED PORT INSERTION  Result Date: 01/10/2022 CLINICAL DATA:  Classical Hodgkin's lymphoma and need for porta cath for chemotherapy. EXAM:  IMPLANTED PORT A CATH PLACEMENT WITH ULTRASOUND AND FLUOROSCOPIC GUIDANCE ANESTHESIA/SEDATION: Moderate (conscious) sedation was employed during this procedure. A total of Versed 2.0 mg and Fentanyl 50 mcg was administered intravenously. Moderate Sedation Time: 31 minutes. The patient's level of consciousness and vital signs were monitored continuously by radiology nursing throughout the procedure under my direct supervision. FLUOROSCOPY: 30 seconds.  2.0 mGy. PROCEDURE: The procedure, risks, benefits, and alternatives were explained to the patient. Questions regarding the procedure were encouraged and answered. The patient understands and consents to the procedure. A time-out was performed prior to initiating the procedure. Ultrasound was utilized to confirm patency of the right internal jugular vein. A permanent ultrasound image was saved and recorded. The right neck and chest were prepped with chlorhexidine in a sterile fashion, and a sterile drape was applied covering the operative field. Maximum barrier sterile technique with sterile gowns and gloves were used for the procedure. Local anesthesia was provided with 1% lidocaine. After creating a small venotomy incision, a 21 gauge needle was advanced into the right internal jugular vein under direct, real-time ultrasound guidance. Ultrasound image documentation was performed. After securing guidewire access, an 8 Fr dilator was placed. A J-wire was kinked to measure appropriate catheter length. A subcutaneous port pocket was then created along the upper chest wall utilizing sharp and blunt dissection. Portable cautery was utilized. The pocket was irrigated with sterile saline. A single lumen power injectable port was chosen for placement. The 8 Fr catheter was tunneled from the port pocket site to the venotomy incision. The port was placed in the pocket. External catheter was trimmed to appropriate length based on guidewire measurement. At the venotomy, an 8 Fr  peel-away sheath was placed over a guidewire. The catheter was then placed through the sheath and the sheath removed. Final catheter positioning was confirmed and documented with a fluoroscopic spot image. The port was accessed with a needle and aspirated and flushed with heparinized saline. The access needle was removed. The venotomy and port pocket incisions were closed with subcutaneous 3-0 Monocryl and subcuticular 4-0 Vicryl. Dermabond was applied to both incisions. COMPLICATIONS: COMPLICATIONS None FINDINGS: After catheter placement, the tip lies at the cavo-atrial junction. The catheter aspirates normally and is ready for immediate use. IMPRESSION: Placement of single lumen port a cath via right internal jugular vein. The catheter tip lies at the cavo-atrial junction. A power injectable port a cath was placed and is ready for immediate use. Electronically Signed   By: Aletta Edouard M.D.   On: 01/10/2022 16:37   ECHOCARDIOGRAM COMPLETE  Result Date: 01/08/2022    ECHOCARDIOGRAM REPORT   Patient Name:   ALYSSA ROTONDO Date of Exam: 01/06/2022 Medical Rec #:  [id number]  Height:       61.0 in Accession #:    [id number]       Weight:       221.3 lb Date of Birth:  09-Jan-1951        BSA:          1.972 m Patient Age:    47 years         BP:           135/74 mmHg Patient Gender: F                HR:           65 bpm. Exam Location:  Outpatient Procedure: 2D Echo and Strain Analysis Indications:    Chemo  History:        Patient has prior history of Echocardiogram examinations, most                 recent 09/04/2019. Risk Factors:Hypertension.  Sonographer:    Harvie Junior Referring Phys: [id number] IRENE T THAYIL  Sonographer Comments: Technically difficult study due to poor echo windows and patient is obese. Image acquisition challenging due to patient body habitus. Global longitudinal strain was attempted. IMPRESSIONS  1. Left ventricular ejection fraction, by estimation, is 60 to 65%. The left ventricle  has normal function. The left ventricle has no regional wall motion abnormalities. Left ventricular diastolic parameters are consistent with Grade I diastolic dysfunction (impaired relaxation). The average left ventricular global longitudinal strain is -19.1 %. The global longitudinal strain is normal.  2. Right ventricular systolic function is normal. The right ventricular size is normal. There is normal pulmonary artery systolic pressure.  3. The mitral valve is normal in structure. No evidence of mitral valve regurgitation. No evidence of mitral stenosis.  4. The aortic valve was not well visualized. Aortic valve regurgitation is not visualized. No aortic stenosis is present.  5. The inferior vena cava is normal in size with greater than 50% respiratory variability, suggesting right atrial pressure of 3 mmHg. FINDINGS  Left Ventricle: Left ventricular ejection fraction, by estimation, is 60 to 65%. The left ventricle has normal function. The left ventricle has no regional wall motion abnormalities. The average left ventricular global longitudinal strain is -19.1 %. The global longitudinal strain is normal. The left ventricular internal cavity size was normal in size. There is no left ventricular hypertrophy. Left ventricular diastolic parameters are consistent with Grade I diastolic dysfunction (impaired relaxation). Indeterminate filling pressures. Right Ventricle: The right ventricular size is normal. No increase in right ventricular wall thickness. Right ventricular systolic function is normal. There is normal pulmonary artery systolic pressure. The tricuspid regurgitant velocity is 2.54 m/s, and  with an assumed right atrial pressure of 3 mmHg, the estimated right ventricular systolic pressure is 17.4 mmHg. Left Atrium: Left atrial size was normal in size. Right Atrium: Right atrial size was normal in size. Pericardium: There is no evidence of pericardial effusion. Mitral Valve: The mitral valve is normal in  structure. No evidence of mitral valve regurgitation. No evidence of mitral valve stenosis. Tricuspid Valve: The tricuspid valve is normal in structure. Tricuspid valve regurgitation is trivial. No evidence of tricuspid stenosis. Aortic Valve: The aortic valve was not well visualized. Aortic valve regurgitation is not visualized. No aortic stenosis is present. Aortic valve mean gradient measures 3.0 mmHg. Aortic valve peak gradient measures 5.9 mmHg. Aortic valve area, by VTI measures 2.51 cm. Pulmonic Valve: The pulmonic valve was normal in structure. Pulmonic valve  regurgitation is not visualized. No evidence of pulmonic stenosis. Aorta: The aortic root is normal in size and structure. Venous: The inferior vena cava is normal in size with greater than 50% respiratory variability, suggesting right atrial pressure of 3 mmHg. IAS/Shunts: No atrial level shunt detected by color flow Doppler.  LEFT VENTRICLE PLAX 2D LVIDd:         4.50 cm     Diastology LVIDs:         2.90 cm     LV e' medial:    6.74 cm/s LV PW:         0.90 cm     LV E/e' medial:  13.9 LV IVS:        0.90 cm     LV e' lateral:   11.70 cm/s LVOT diam:     1.90 cm     LV E/e' lateral: 8.0 LV SV:         66 LV SV Index:   34          2D Longitudinal Strain LVOT Area:     2.84 cm    2D Strain GLS (A2C):   -18.9 %                            2D Strain GLS (A3C):   -19.9 %                            2D Strain GLS (A4C):   -18.5 % LV Volumes (MOD)           2D Strain GLS Avg:     -19.1 % LV vol d, MOD A2C: 62.8 ml LV vol d, MOD A4C: 74.7 ml LV vol s, MOD A2C: 24.0 ml LV vol s, MOD A4C: 28.2 ml 3D Volume EF: LV SV MOD A2C:     38.8 ml 3D EF:        73 % LV SV MOD A4C:     74.7 ml LV EDV:       205 ml LV SV MOD BP:      41.9 ml LV ESV:       55 ml                            LV SV:        150 ml RIGHT VENTRICLE RV Basal diam:  3.20 cm RV Mid diam:    3.00 cm RV S prime:     12.90 cm/s TAPSE (M-mode): 1.7 cm LEFT ATRIUM           Index        RIGHT ATRIUM           Index LA diam:      2.70 cm 1.37 cm/m   RA Area:     8.79 cm LA Vol (A2C): 31.5 ml 15.98 ml/m  RA Volume:   15.60 ml 7.91 ml/m LA Vol (A4C): 26.5 ml 13.44 ml/m  AORTIC VALVE                    PULMONIC VALVE AV Area (Vmax):    2.51 cm     PV Vmax:       1.10 m/s AV Area (Vmean):   2.54 cm     PV Peak grad:  4.8 mmHg AV Area (VTI):  2.51 cm AV Vmax:           121.00 cm/s AV Vmean:          79.300 cm/s AV VTI:            0.264 m AV Peak Grad:      5.9 mmHg AV Mean Grad:      3.0 mmHg LVOT Vmax:         107.00 cm/s LVOT Vmean:        71.000 cm/s LVOT VTI:          0.234 m LVOT/AV VTI ratio: 0.89  AORTA Ao Root diam: 2.60 cm Ao Asc diam:  2.70 cm MITRAL VALVE                TRICUSPID VALVE MV Area (PHT): 3.31 cm     TR Peak grad:   25.8 mmHg MV Decel Time: 229 msec     TR Vmax:        254.00 cm/s MR Peak grad: 47.1 mmHg MR Vmax:      343.00 cm/s   SHUNTS MV E velocity: 93.80 cm/s   Systemic VTI:  0.23 m MV A velocity: 108.00 cm/s  Systemic Diam: 1.90 cm MV E/A ratio:  0.87 Skeet Latch MD Electronically signed by Skeet Latch MD Signature Date/Time: 01/08/2022/5:24:10 PM    Final    NM PET Image Initial (PI) Skull Base To Thigh  Result Date: 01/06/2022 CLINICAL DATA:  Initial treatment strategy for new diagnosis of Hodgkin's lymphoma. EXAM: NUCLEAR MEDICINE PET SKULL BASE TO THIGH TECHNIQUE: 10.9 mCi F-18 FDG was injected intravenously. Full-ring PET imaging was performed from the skull base to thigh after the radiotracer. CT data was obtained and used for attenuation correction and anatomic localization. Fasting blood glucose: 144 mg/dl COMPARISON:  12/09/2021 abdominopelvic CT. Most recent chest CT 07/21/2019 FINDINGS: Mild limitations secondary to patient body habitus. Mediastinal blood pool activity: SUV max 3.6 Liver activity: SUV max 4.9 NECK: No areas of abnormal hypermetabolism. Incidental CT findings: No cervical adenopathy. Bilateral carotid atherosclerosis. CHEST: No pulmonary  parenchymal hypermetabolism. Bilateral small axillary nodes with low-level nonspecific hypermetabolism. Example left axillary node at 8 mm and a S.U.V. max of 2.5 on 72/4. An AP window node measures 7 mm and a S.U.V. max of 3.4 on 64/4. Incidental CT findings: Mild cardiomegaly. Aortic and coronary artery calcification. Right lower lobe pulmonary nodules on the order of 3-4 mm, including on 75/4, were present in 2021 and considered benign. ABDOMEN/PELVIS: A portacaval node measures 2.4 x 1.7 cm and a S.U.V. max of 6.4 on 110/4. Hypermetabolism corresponding to the abdominal retroperitoneal bulky adenopathy. Index nodal mass measures 3.9 x 2.7 cm and a S.U.V. max of 10.5 on 121/4. No pelvic nodal hypermetabolism.  No splenic hypermetabolism. Sigmoid colonic hypermetabolism corresponds to extensive diverticulosis and possible diverticulitis. Example at a S.U.V. max of 8.8 on 162/4. Incidental CT findings: Deferred to recent diagnostic CT. Dependent gallstones. Moderate hepatic steatosis. Caudate and lateral segment left liver lobe enlargement. Normal adrenal glands. Pelvic floor laxity. SKELETON: No marrow hypermetabolism. Left iliacus tendon hypermetabolism suggests strain including on 168/4. Incidental CT findings: Osteopenia. IMPRESSION: 1. Hypermetabolic abdominal adenopathy, consistent with lymphoma. ([place]) 5 2. Mild hypermetabolism involving mediastinal and bilateral axillary nodes, equivocal but favored to represent active lymphoma. 3. Sigmoid colonic focal hypermetabolism in the setting of diverticulosis and possible diverticulitis. Correlate with colon cancer screening history, as underlying polyp or mass cannot be excluded. 4. Hepatic steatosis. Hepatic morphology for which  mild cirrhosis cannot be excluded. 5. Mild limitations secondary to patient body habitus. 6. Incidental findings, including: Coronary artery atherosclerosis. Aortic Atherosclerosis (ICD10-I70.0). Cholelithiasis. Electronically Signed    By: Abigail Miyamoto M.D.   On: 01/06/2022 08:28    ASSESSMENT & PLAN Seba Madole 71 y.o. female with medical history significant for newly diagnosed Hodgkin's lymphoma who presents for a follow up visit.   #Hodgkin's Lymphoma, Stage III --biopsy of abdominal lymph node on 12/27/2021 showed classical Hodgkin's lymphoma.  --Pre-treatment PET scan from [place] 5 abdominal adenopathy along with mild hypermetabolism involving mediastinal and bilateral axillary nodes. --Recommend AVD chemotherapy x 2 cycles followed by PET CT scan.  PLAN: --today is Cycle 1, Day 15 of AVD chemotherapy --Labs from today show white blood cell 2.6, hemoglobin 11.4, MCV 89.6, and platelets of 187 --AVD chemotherapy can continue without the G-CSF support or in the setting of severe neutropenia. --Proceed with treatment today without any dose modifications --RTC in two weeks with labs fore Cycle 2 Day 1 of AVD chemotherapy.   #Sigmoid colonic focal hypermetabolism #Hepatic steatosis with possible cirrhosis --Will request follow up with GI to further evaluate.  #Supportive Care -- chemotherapy education complete -- port placement complete --baseline echo from 01/06/2022 showed EF 60-65% -- not planning for bleomycin, no need for PFTs.  --  compazine 37m PO q6H for nausea (zofran contraindicated due to QT prolongated medications).  -- allopurinol 3096mPO daily for TLS prophylaxis -- EMLA cream for port -- no pain medication required at this time.    Orders Placed This Encounter  Procedures   CBC with Differential ([facility])    Standing Status:   Future    Standing Expiration Date:   02/15/2023   CMP ([facility])    Standing Status:   Future    Standing Expiration Date:   02/15/2023   CBC with Differential ([facility])    Standing Status:   Future    Standing Expiration Date:   03/01/2023   CMP ([facility])    Standing Status:   Future    Standing Expiration Date:    03/01/2023   CBC with Differential ([facility])    Standing Status:   Future    Standing Expiration Date:   03/15/2023   CMP ([facility])    Standing Status:   Future    Standing Expiration Date:   03/15/2023   CBC with Differential ([facility])    Standing Status:   Future    Standing Expiration Date:   03/29/2023   CMP ([facility])    Standing Status:   Future    Standing Expiration Date:   03/29/2023    All questions were answered. The patient knows to call the clinic with any problems, questions or concerns.  I have spent a total of 30 minutes minutes of face-to-face and non-face-to-face time, preparing to see the patient, performing a medically appropriate examination, counseling and educating the patient, ordering tests/procedures, communicating with other health care professionals, documenting clinical information in the electronic health record,  and care coordination.   JoLedell PeoplesMD Department of Hematology/Oncology [facility] LPhone: [phone]: [phone]: [email or web address]    01/31/2022 1:49 PM

## 2022-02-01 ENCOUNTER — Other Ambulatory Visit: Payer: Self-pay

## 2022-02-08 ENCOUNTER — Telehealth: Payer: Self-pay

## 2022-02-08 NOTE — Telephone Encounter (Signed)
Thornton Park, MD  Carl Best, RN  Please schedule office visit with me or any APP - ideally within the next 2 weeks. Thanks.

## 2022-02-08 NOTE — Telephone Encounter (Signed)
Called to scheduled OV with patient & rehab center stated appointments needed to be scheduled with Diane (scheduler) since patient is confused at times. OV scheduled with Diane for 02/22/22 at 9:10 am with Dr. Tarri Glenn.

## 2022-02-10 ENCOUNTER — Emergency Department (HOSPITAL_COMMUNITY): Payer: Medicare Other

## 2022-02-10 ENCOUNTER — Other Ambulatory Visit: Payer: Self-pay

## 2022-02-10 ENCOUNTER — Encounter (HOSPITAL_COMMUNITY): Payer: Self-pay

## 2022-02-10 ENCOUNTER — Inpatient Hospital Stay (HOSPITAL_COMMUNITY)
Admission: EM | Admit: 2022-02-10 | Discharge: 2022-02-13 | DRG: 809 | Disposition: A | Discharge status: Skilled Nursing Facility | Payer: Medicare Other | Source: Skilled Nursing Facility | Attending: Internal Medicine | Admitting: Internal Medicine

## 2022-02-10 DIAGNOSIS — E876 Hypokalemia: Secondary | ICD-10-CM | POA: Diagnosis not present

## 2022-02-10 DIAGNOSIS — E1165 Type 2 diabetes mellitus with hyperglycemia: Secondary | ICD-10-CM | POA: Diagnosis present

## 2022-02-10 DIAGNOSIS — K7581 Nonalcoholic steatohepatitis (NASH): Secondary | ICD-10-CM | POA: Diagnosis present

## 2022-02-10 DIAGNOSIS — M545 Low back pain, unspecified: Secondary | ICD-10-CM | POA: Diagnosis not present

## 2022-02-10 DIAGNOSIS — D709 Neutropenia, unspecified: Secondary | ICD-10-CM | POA: Diagnosis not present

## 2022-02-10 DIAGNOSIS — E1129 Type 2 diabetes mellitus with other diabetic kidney complication: Secondary | ICD-10-CM | POA: Diagnosis not present

## 2022-02-10 DIAGNOSIS — Z794 Long term (current) use of insulin: Secondary | ICD-10-CM

## 2022-02-10 DIAGNOSIS — Z1152 Encounter for screening for COVID-19: Secondary | ICD-10-CM | POA: Diagnosis not present

## 2022-02-10 DIAGNOSIS — L89899 Pressure ulcer of other site, unspecified stage: Secondary | ICD-10-CM | POA: Diagnosis present

## 2022-02-10 DIAGNOSIS — Z825 Family history of asthma and other chronic lower respiratory diseases: Secondary | ICD-10-CM

## 2022-02-10 DIAGNOSIS — Z888 Allergy status to other drugs, medicaments and biological substances status: Secondary | ICD-10-CM

## 2022-02-10 DIAGNOSIS — E871 Hypo-osmolality and hyponatremia: Secondary | ICD-10-CM | POA: Diagnosis present

## 2022-02-10 DIAGNOSIS — M109 Gout, unspecified: Secondary | ICD-10-CM | POA: Diagnosis present

## 2022-02-10 DIAGNOSIS — R5081 Fever presenting with conditions classified elsewhere: Secondary | ICD-10-CM | POA: Diagnosis present

## 2022-02-10 DIAGNOSIS — Z6841 Body Mass Index (BMI) 40.0 and over, adult: Secondary | ICD-10-CM | POA: Diagnosis not present

## 2022-02-10 DIAGNOSIS — D701 Agranulocytosis secondary to cancer chemotherapy: Secondary | ICD-10-CM | POA: Diagnosis not present

## 2022-02-10 DIAGNOSIS — Z881 Allergy status to other antibiotic agents status: Secondary | ICD-10-CM

## 2022-02-10 DIAGNOSIS — C8190 Hodgkin lymphoma, unspecified, unspecified site: Secondary | ICD-10-CM | POA: Diagnosis present

## 2022-02-10 DIAGNOSIS — K219 Gastro-esophageal reflux disease without esophagitis: Secondary | ICD-10-CM | POA: Diagnosis present

## 2022-02-10 DIAGNOSIS — Z8673 Personal history of transient ischemic attack (TIA), and cerebral infarction without residual deficits: Secondary | ICD-10-CM

## 2022-02-10 DIAGNOSIS — F200 Paranoid schizophrenia: Secondary | ICD-10-CM | POA: Diagnosis not present

## 2022-02-10 DIAGNOSIS — C8193 Hodgkin lymphoma, unspecified, intra-abdominal lymph nodes: Secondary | ICD-10-CM | POA: Diagnosis not present

## 2022-02-10 DIAGNOSIS — E7800 Pure hypercholesterolemia, unspecified: Secondary | ICD-10-CM | POA: Diagnosis not present

## 2022-02-10 DIAGNOSIS — I10 Essential (primary) hypertension: Secondary | ICD-10-CM | POA: Diagnosis not present

## 2022-02-10 DIAGNOSIS — J45909 Unspecified asthma, uncomplicated: Secondary | ICD-10-CM | POA: Diagnosis present

## 2022-02-10 DIAGNOSIS — T451X5A Adverse effect of antineoplastic and immunosuppressive drugs, initial encounter: Secondary | ICD-10-CM | POA: Diagnosis present

## 2022-02-10 DIAGNOSIS — R0902 Hypoxemia: Secondary | ICD-10-CM | POA: Diagnosis not present

## 2022-02-10 DIAGNOSIS — R5381 Other malaise: Secondary | ICD-10-CM | POA: Diagnosis not present

## 2022-02-10 DIAGNOSIS — Z7984 Long term (current) use of oral hypoglycemic drugs: Secondary | ICD-10-CM

## 2022-02-10 DIAGNOSIS — Z79899 Other long term (current) drug therapy: Secondary | ICD-10-CM

## 2022-02-10 LAB — COMPREHENSIVE METABOLIC PANEL
ALT: 67 U/L — ABNORMAL HIGH (ref 0–44)
AST: 47 U/L — ABNORMAL HIGH (ref 15–41)
Albumin: 3.9 g/dL (ref 3.5–5.0)
Alkaline Phosphatase: 147 U/L — ABNORMAL HIGH (ref 38–126)
Anion gap: 11 (ref 5–15)
BUN: 11 mg/dL (ref 8–23)
CO2: 24 mmol/L (ref 22–32)
Calcium: 9.2 mg/dL (ref 8.9–10.3)
Chloride: 99 mmol/L (ref 98–111)
Creatinine, Ser: 0.77 mg/dL (ref 0.44–1.00)
GFR, Estimated: >60 mL/min (ref >60)
Glucose, Bld: 333 mg/dL — ABNORMAL HIGH (ref 70–99)
Potassium: 3.5 mmol/L (ref 3.5–5.1)
Sodium: 134 mmol/L — ABNORMAL LOW (ref 135–145)
Total Bilirubin: 1.3 mg/dL — ABNORMAL HIGH (ref 0.3–1.2)
Total Protein: 7.3 g/dL (ref 6.5–8.1)

## 2022-02-10 LAB — CBC WITH DIFFERENTIAL/PLATELET
Abs Immature Granulocytes: 0.03 10*3/uL (ref 0.00–0.07)
Basophils Absolute: 0 10*3/uL (ref 0.0–0.1)
Basophils Relative: 1 %
Eosinophils Absolute: 0 10*3/uL (ref 0.0–0.5)
Eosinophils Relative: 0 %
HCT: 33.9 % — ABNORMAL LOW (ref 36.0–46.0)
Hemoglobin: 11.4 g/dL — ABNORMAL LOW (ref 12.0–15.0)
Immature Granulocytes: 4 %
Lymphocytes Relative: 67 %
Lymphs Abs: 0.6 10*3/uL — ABNORMAL LOW (ref 0.7–4.0)
MCH: 29.6 pg (ref 26.0–34.0)
MCHC: 33.6 g/dL (ref 30.0–36.0)
MCV: 88.1 fL (ref 80.0–100.0)
Monocytes Absolute: 0.2 10*3/uL (ref 0.1–1.0)
Monocytes Relative: 26 %
Neutro Abs: 0 10*3/uL — CL (ref 1.7–7.7)
Neutrophils Relative %: 2 %
Platelets: 89 10*3/uL — ABNORMAL LOW (ref 150–400)
RBC: 3.85 MIL/uL — ABNORMAL LOW (ref 3.87–5.11)
RDW: 13.1 % (ref 11.5–15.5)
WBC: 0.9 10*3/uL — CL (ref 4.0–10.5)

## 2022-02-10 LAB — URINALYSIS, ROUTINE W REFLEX MICROSCOPIC
Bacteria, UA: NONE SEEN
Bilirubin Urine: NEGATIVE
Glucose, UA: >=500 mg/dL — AB
Hgb urine dipstick: NEGATIVE
Ketones, ur: 20 mg/dL — AB
Leukocytes,Ua: NEGATIVE
Nitrite: NEGATIVE
Protein, ur: 30 mg/dL — AB
Specific Gravity, Urine: 1.042 — ABNORMAL HIGH (ref 1.005–1.030)
pH: 7 (ref 5.0–8.0)

## 2022-02-10 LAB — LIPASE, BLOOD: Lipase: 26 U/L (ref 11–51)

## 2022-02-10 LAB — RESP PANEL BY RT-PCR (RSV, FLU A&B, COVID)  RVPGX2
Influenza A by PCR: NEGATIVE
Influenza B by PCR: NEGATIVE
Resp Syncytial Virus by PCR: NEGATIVE
SARS Coronavirus 2 by RT PCR: NEGATIVE

## 2022-02-10 LAB — PROTIME-INR
INR: 1.1 (ref 0.8–1.2)
Prothrombin Time: 13.9 seconds (ref 11.4–15.2)

## 2022-02-10 LAB — APTT: aPTT: 37 seconds — ABNORMAL HIGH (ref 24–36)

## 2022-02-10 LAB — LACTIC ACID, PLASMA
Lactic Acid, Venous: 1.3 mmol/L (ref 0.5–1.9)
Lactic Acid, Venous: 1.2 mmol/L (ref 0.5–1.9)

## 2022-02-10 MED ORDER — VANCOMYCIN HCL IN DEXTROSE 1-5 GM/200ML-% IV SOLN
1000.0000 mg | Freq: Once | INTRAVENOUS | Status: AC
  Administered 2022-02-10: 1000 mg via INTRAVENOUS
  Filled 2022-02-10: qty 200

## 2022-02-10 MED ORDER — METRONIDAZOLE 500 MG/100ML IV SOLN
500.0000 mg | Freq: Once | INTRAVENOUS | Status: AC
  Administered 2022-02-10: 500 mg via INTRAVENOUS
  Filled 2022-02-10: qty 100

## 2022-02-10 MED ORDER — LACTATED RINGERS IV BOLUS
1000.0000 mL | Freq: Once | INTRAVENOUS | Status: AC
  Administered 2022-02-10: 1000 mL via INTRAVENOUS

## 2022-02-10 MED ORDER — SODIUM CHLORIDE 0.9 % IV SOLN
2.0000 g | Freq: Once | INTRAVENOUS | Status: AC
  Administered 2022-02-10: 2 g via INTRAVENOUS
  Filled 2022-02-10: qty 12.5

## 2022-02-10 MED ORDER — LACTATED RINGERS IV SOLN: INTRAVENOUS | Status: DC

## 2022-02-10 MED ORDER — TBO-FILGRASTIM 480 MCG/0.8ML ~~LOC~~ SOSY: 480.0000 ug | PREFILLED_SYRINGE | Freq: Once | SUBCUTANEOUS | Status: DC

## 2022-02-10 MED ORDER — POTASSIUM CHLORIDE CRYS ER 20 MEQ PO TBCR
20.0000 meq | EXTENDED_RELEASE_TABLET | Freq: Once | ORAL | Status: AC
  Administered 2022-02-10: 20 meq via ORAL
  Filled 2022-02-10: qty 1

## 2022-02-10 MED ORDER — ONDANSETRON HCL 4 MG/2ML IJ SOLN: 4.0000 mg | Freq: Once | INTRAMUSCULAR | Status: DC

## 2022-02-10 MED ORDER — ACETAMINOPHEN 325 MG PO TABS
650.0000 mg | ORAL_TABLET | Freq: Once | ORAL | Status: AC
  Administered 2022-02-10: 650 mg via ORAL
  Filled 2022-02-10: qty 2

## 2022-02-10 MED ORDER — TBO-FILGRASTIM 480 MCG/0.8ML ~~LOC~~ SOSY
480.0000 ug | PREFILLED_SYRINGE | Freq: Every day | SUBCUTANEOUS | Status: DC
  Administered 2022-02-10 – 2022-02-11 (×2): 480 ug via SUBCUTANEOUS
  Filled 2022-02-10 (×2): qty 0.8

## 2022-02-10 MED ORDER — IOHEXOL 300 MG/ML  SOLN
100.0000 mL | Freq: Once | INTRAMUSCULAR | Status: AC | PRN
  Administered 2022-02-10: 100 mL via INTRAVENOUS

## 2022-02-10 MED ORDER — HYDROMORPHONE HCL 1 MG/ML IJ SOLN
0.5000 mg | Freq: Once | INTRAMUSCULAR | Status: DC
  Filled 2022-02-10: qty 1

## 2022-02-10 NOTE — Progress Notes (Addendum)
A consult was received from an ED physician for cefepime and vancomycin per pharmacy dosing.  The patient's profile has been reviewed for ht/wt/allergies/indication/available labs.   A one time order has been placed for cefepime 2g and vancomycin 1g.  Further antibiotics/pharmacy consults should be ordered by admitting physician if indicated.                       Thank you, Peggyann Juba, PharmD, BCPS 02/10/2022  8:21 PM

## 2022-02-10 NOTE — ED Triage Notes (Signed)
Pt has a PMH of breast cancer and stomach cancer.pt [facility] pt has been vomiting. Facility reports temperature of 99.1.   22 Lhand.  10/10 abd pain  Cbg 328 Pulse 110 Bp 152/80 Spo2 96%ra

## 2022-02-10 NOTE — Sepsis Progress Note (Signed)
Following for sepsis monitoring ?

## 2022-02-10 NOTE — ED Provider Notes (Signed)
[facility] DEPT Provider Note   CSN: [id number] Arrival date & time: 02/10/22  1857     History  Chief Complaint  Patient presents with   Nausea   Abdominal Pain    Heather Petty is a 72 y.o. female.  HPI 72 year old female with multiple comorbidities which include type 2 diabetes, Hodgkin lymphoma, schizophrenia, obesity presents with vomiting and abdominal pain.  The history is from the patient as well as the nurse who spoke to EMS.  Patient tells me the abdominal pain actually started yesterday.  She started having vomiting today.  The facility the patient's had tried to give her Tylenol but she vomited.  The pain is primarily periumbilical.  Some low back pain as well.  Denies shortness of breath but is having a cough.  No sore throat or chest pain. She is currently on chemotherapy. She was also found to be hypoxic here and placed on 3L O2.  Home Medications Prior to Admission medications   Medication Sig Start Date End Date Taking? Authorizing Provider  albuterol (VENTOLIN HFA) 108 (90 Base) MCG/ACT inhaler Inhale 2 puffs into the lungs every 4 (four) hours as needed for wheezing or shortness of breath.    [provider]  allopurinol (ZYLOPRIM) 300 MG tablet Take 300 mg by mouth daily.    [provider]  amLODipine (NORVASC) 10 MG tablet Take 10 mg by mouth daily.    [provider]  ammonium lactate (LAC-HYDRIN) 12 % lotion Apply 1 application  topically daily as needed for dry skin.    [provider]  carvedilol (COREG) 6.25 MG tablet Take 6.25 mg by mouth 2 (two) times daily with a meal.    [provider]  Dextromethorphan-guaiFENesin 5-100 MG/5ML LIQD Take 10 mLs by mouth every 6 (six) hours as needed.    [provider]  doxepin (SINEQUAN) 25 MG capsule Take 25 mg by mouth at bedtime.    [provider]  famotidine (PEPCID) 20 MG tablet Take 20 mg by mouth 2 (two) times daily.     [provider]  glipiZIDE (GLUCOTROL XL) 10 MG 24 hr tablet Take 10 mg by mouth 2 (two) times daily.     [provider]  HYDROcodone-acetaminophen (NORCO/VICODIN) 5-325 MG tablet Take 1 tablet by mouth 2 (two) times daily.    [provider]  hydrOXYzine (ATARAX/VISTARIL) 25 MG tablet Take 25 mg by mouth in the morning and at bedtime.    [provider]  insulin aspart (NOVOLOG) 100 UNIT/ML injection Inject 0-12 Units into the skin See admin instructions. Inject 0-12 units into the skin 4 times a day (before meals and at bedtime) per sliding scale: BGL <60 or >400  = CALL MD; 60-200 = 0 units; 201-250 = 2 units; 251-300 = 4 units; 301-350 = 6 units; 351-400 = 8 units; 401-450 = 10 units; >450 = 12 units 07/31/19   Annita Brod, MD  insulin glargine (LANTUS) 100 UNIT/ML injection Inject 0.1 mLs (10 Units total) into the skin at bedtime. Patient taking differently: Inject 22 Units into the skin at bedtime. 09/09/19   Pokhrel, Laxman, MD  lidocaine (LIDODERM) 5 % Place 1 patch onto the skin daily. Remove & Discard patch within 12 hours or as directed by MD    [provider]  lidocaine-prilocaine (EMLA) cream Apply 1 Application topically as needed. 01/16/22   Dede Query T, PA-C  lisinopril (ZESTRIL) 5 MG tablet Take 5 mg by mouth  daily.    [provider]  loperamide (IMODIUM) 2 MG capsule Take 1 capsule (2 mg total) by mouth as needed for diarrhea or loose stools. 09/09/19   Pokhrel, Corrie Mckusick, MD  Polyethyl Glycol-Propyl Glycol (SYSTANE ULTRA) 0.4-0.3 % SOLN Place 2 drops into both eyes in the morning and at bedtime.    [provider]  polyethylene glycol (MIRALAX / GLYCOLAX) 17 g packet Take 17 g by mouth daily as needed for mild constipation.    [provider]  prochlorperazine (COMPAZINE) 10 MG tablet Take 1 tablet (10 mg total) by mouth every 6 (six) hours as needed for nausea or vomiting. 01/16/22   Dede Query T, PA-C   risperiDONE (RISPERDAL) 0.5 MG tablet Take 0.5 mg by mouth daily.    [provider]  Semaglutide,0.25 or 0.'5MG'$ /DOS, (OZEMPIC, 0.25 OR 0.5 MG/DOSE,) 2 MG/1.5ML SOPN Inject 0.5 mg into the skin once a week. On Sunday    [provider]  triamcinolone (NASACORT ALLERGY 24HR) 55 MCG/ACT AERO nasal inhaler Place 1 spray into the nose daily.    [provider]      Allergies    Emend [aprepitant], Vancomycin, Fosaprepitant, Lasix [furosemide], and Lasix [furosemide]    Review of Systems   Review of Systems  Constitutional:  Positive for fever.  Respiratory:  Positive for cough.   Cardiovascular:  Negative for chest pain.  Gastrointestinal:  Positive for abdominal pain, nausea and vomiting. Negative for diarrhea.  Genitourinary:  Negative for dysuria.  Musculoskeletal:  Positive for back pain.    Physical Exam Updated Vital Signs BP (!) 152/67   Pulse 97   Temp 98.9 F (37.2 C) (Oral)   Resp (!) 21   SpO2 99%  Physical Exam Vitals and nursing note reviewed.  Constitutional:      Appearance: She is well-developed. She is obese.  HENT:     Head: Normocephalic and atraumatic.  Cardiovascular:     Rate and Rhythm: Regular rhythm. Tachycardia present.     Heart sounds: Normal heart sounds.  Pulmonary:     Effort: Pulmonary effort is normal.     Breath sounds: Normal breath sounds. No wheezing.  Abdominal:     Palpations: Abdomen is soft.     Tenderness: There is generalized abdominal tenderness.  Skin:    General: Skin is warm and dry.  Neurological:     Mental Status: She is alert.     ED Results / Procedures / Treatments   Labs (all labs ordered are listed, but only abnormal results are displayed) Labs Reviewed  COMPREHENSIVE METABOLIC PANEL - Abnormal; Notable for the following components:      Result Value   Sodium 134 (*)    Glucose, Bld 333 (*)    AST 47 (*)    ALT 67 (*)    Alkaline Phosphatase 147 (*)    Total Bilirubin 1.3 (*)     All other components within normal limits  APTT - Abnormal; Notable for the following components:   aPTT 37 (*)    All other components within normal limits  URINALYSIS, ROUTINE W REFLEX MICROSCOPIC - Abnormal; Notable for the following components:   Specific Gravity, Urine 1.042 (*)    Glucose, UA >=500 (*)    Ketones, ur 20 (*)    Protein, ur 30 (*)    All other components within normal limits  CBC WITH DIFFERENTIAL/PLATELET - Abnormal; Notable for the following components:   WBC 0.9 (*)    RBC 3.85 (*)  Hemoglobin 11.4 (*)    HCT 33.9 (*)    Platelets 89 (*)    Neutro Abs 0.0 (*)    Lymphs Abs 0.6 (*)    All other components within normal limits  RESP PANEL BY RT-PCR (RSV, FLU A&B, COVID)  RVPGX2  CULTURE, BLOOD (ROUTINE X 2)  CULTURE, BLOOD (ROUTINE X 2)  URINE CULTURE  LACTIC ACID, PLASMA  LACTIC ACID, PLASMA  PROTIME-INR  LIPASE, BLOOD  CBC WITH DIFFERENTIAL/PLATELET    EKG None  Radiology US Abdomen Limited RUQ (LIVER/GB)  Result Date: 02/10/2022 CLINICAL DATA:  [id number] Abdominal pain [id number] EXAM: ULTRASOUND ABDOMEN LIMITED COMPARISON:  11/06/2019 FINDINGS: The liver demonstrates increased parenchymal echogenicity consistent with fatty infiltration. No focal hepatic parenchymal lesions or intrahepatic ductal dilatation. Layering small shadowing gallstones. No Wall thickening or pericholecystic fluid. CBD measured 0.5cm. IMPRESSION: Hepatic fatty infiltration.  Cholelithiasis. Electronically Signed   By: Sammie Bench M.D.   On: 02/10/2022 22:47   CT ABDOMEN PELVIS W CONTRAST  Result Date: 02/10/2022 CLINICAL DATA:  Hodgkin's lymphoma, vomiting EXAM: CT ABDOMEN AND PELVIS WITH CONTRAST TECHNIQUE: Multidetector CT imaging of the abdomen and pelvis was performed using the standard protocol following bolus administration of intravenous contrast. RADIATION DOSE REDUCTION: This exam was performed according to the departmental dose-optimization program which includes  automated exposure control, adjustment of the mA and/or kV according to patient size and/or use of iterative reconstruction technique. CONTRAST:  100 mL OMNIPAQUE IOHEXOL 300 MG/ML  SOLN COMPARISON:  12/09/2021 and 07/21/2019. FINDINGS: Lower chest: Atheromatous changes of coronary arteries and aorta. No pleural or pericardial effusion. Stable scarring identified lingula with a 1 cm density noted to be stable. Hepatobiliary: No lesions identified in the liver. No intrahepatic ductal dilatation. Gallbladder contains small calcified stones or layering milk of calcium. Pancreas: Fatty atrophy.  No focal lesions identified Spleen: Normal in size without focal abnormality. Adrenals/Urinary Tract: Adrenal glands are unremarkable. Kidneys are normal, without renal calculi, focal lesion, or hydronephrosis. Bladder is unremarkable. Stomach/Bowel: No focal abnormalities are identified in the stomach. No bowel dilatation to suggest obstruction. Normal appendix. Diverticulosis descending and sigmoid colon. Vascular/Lymphatic: Dense atheromatous calcifications of the aorta and its branches. Para-aortic adenopathy identified with the largest lesion now measuring 3.0 x 1.6 cm compared to 3.6 x 2.1 cm previously. Additional retroperitoneal nodes diminished in size and number compared to prior study. Reproductive: Uterus and bilateral adnexa are unremarkable. Other: No abdominal wall hernia or abnormality. No abdominopelvic ascites. Musculoskeletal: Extensive thoracolumbosacral degenerative changes. IMPRESSION: 1. Hypermetabolic retroperitoneal adenopathy less prominent than on prior study. 2. Diverticulosis. 3. No acute abdominal or pelvic pathology was identified. Electronically Signed   By: Sammie Bench M.D.   On: 02/10/2022 21:56   DG Chest Port 1 View  Result Date: 02/10/2022 CLINICAL DATA:  History of breast cancer and stomach cancer presenting with vomiting. EXAM: PORTABLE CHEST 1 VIEW COMPARISON:  September 04, 2019  FINDINGS: There is stable right-sided venous Port-A-Cath positioning. The heart size and mediastinal contours are within normal limits. There is moderate severity calcification of the aortic arch. There is no evidence of acute infiltrate, pleural effusion or pneumothorax. The visualized skeletal structures are unremarkable. IMPRESSION: No active cardiopulmonary disease. Electronically Signed   By: Virgina Norfolk M.D.   On: 02/10/2022 21:45    Procedures .Critical Care  Performed by: Sherwood Gambler, MD Authorized by: Sherwood Gambler, MD   Critical care provider statement:    Critical care time (minutes):  40   Critical care time was  exclusive of:  Separately billable procedures and treating other patients   Critical care was necessary to treat or prevent imminent or life-threatening deterioration of the following conditions:  Sepsis   Critical care was time spent personally by me on the following activities:  Development of treatment plan with patient or surrogate, discussions with consultants, evaluation of patient's response to treatment, examination of patient, ordering and review of laboratory studies, ordering and review of radiographic studies, ordering and performing treatments and interventions, pulse oximetry, re-evaluation of patient's condition and review of old charts     Medications Ordered in ED Medications  HYDROmorphone (DILAUDID) injection 0.5 mg (0.5 mg Intravenous Patient Refused/Not Given 02/10/22 2239)  Tbo-Filgrastim (GRANIX) injection 480 mcg (480 mcg Subcutaneous Given 02/10/22 2322)  acetaminophen (TYLENOL) tablet 650 mg (650 mg Oral Given 02/10/22 2032)  lactated ringers bolus 1,000 mL (1,000 mLs Intravenous New Bag/Given 02/10/22 2021)  ceFEPIme (MAXIPIME) 2 g in sodium chloride 0.9 % 100 mL IVPB (0 g Intravenous Stopped 02/10/22 2224)  metroNIDAZOLE (FLAGYL) IVPB 500 mg (0 mg Intravenous Stopped 02/10/22 2132)  vancomycin (VANCOCIN) IVPB 1000 mg/200 mL premix (1,000 mg  Intravenous New Bag/Given 02/10/22 2134)  iohexol (OMNIPAQUE) 300 MG/ML solution 100 mL (100 mLs Intravenous Contrast Given 02/10/22 2110)  potassium chloride SA (KLOR-CON M) CR tablet 20 mEq (20 mEq Oral Given 02/10/22 2316)    ED Course/ Medical Decision Making/ A&P                           Medical Decision Making Amount and/or Complexity of Data Reviewed Labs:     Details: Neutropenia with neutrophil count of 0.0.  Mild LFT abnormality.  Normal lactate. Radiology: ordered and independent interpretation performed.    Details: No pneumonia on chest x-ray.  No obvious infection on CT though does have some small gallstones.  Risk OTC drugs. Prescription drug management. Decision regarding hospitalization.   Patient presents with a fever.  Unclear source.  Found to be neutropenic.  She was given broad IV antibiotics including cefepime, Flagyl, vancomycin.  Was given IV pain control as well as fluids.  Will hold off on Zofran or other antiemetics given some prolonged QTc at this time and she is not actively vomiting.  I did discuss case with Dr. Lindi Adie, who recommends Granix 480 mcg.  Dr. Alcario Drought consulted for admission.        Final Clinical Impression(s) / ED Diagnoses Final diagnoses:  Neutropenic fever ([place])    Rx / DC Orders ED Discharge Orders     None         Sherwood Gambler, MD 02/10/22 2337

## 2022-02-11 ENCOUNTER — Other Ambulatory Visit: Payer: Self-pay

## 2022-02-11 DIAGNOSIS — Z6841 Body Mass Index (BMI) 40.0 and over, adult: Secondary | ICD-10-CM | POA: Diagnosis not present

## 2022-02-11 DIAGNOSIS — F200 Paranoid schizophrenia: Secondary | ICD-10-CM | POA: Diagnosis present

## 2022-02-11 DIAGNOSIS — C8193 Hodgkin lymphoma, unspecified, intra-abdominal lymph nodes: Secondary | ICD-10-CM | POA: Diagnosis present

## 2022-02-11 DIAGNOSIS — J45909 Unspecified asthma, uncomplicated: Secondary | ICD-10-CM | POA: Diagnosis present

## 2022-02-11 DIAGNOSIS — I10 Essential (primary) hypertension: Secondary | ICD-10-CM

## 2022-02-11 DIAGNOSIS — T451X5A Adverse effect of antineoplastic and immunosuppressive drugs, initial encounter: Secondary | ICD-10-CM | POA: Diagnosis present

## 2022-02-11 DIAGNOSIS — M545 Low back pain, unspecified: Secondary | ICD-10-CM | POA: Diagnosis present

## 2022-02-11 DIAGNOSIS — C8198 Hodgkin lymphoma, unspecified, lymph nodes of multiple sites: Secondary | ICD-10-CM

## 2022-02-11 DIAGNOSIS — E876 Hypokalemia: Secondary | ICD-10-CM | POA: Diagnosis not present

## 2022-02-11 DIAGNOSIS — D709 Neutropenia, unspecified: Secondary | ICD-10-CM | POA: Diagnosis present

## 2022-02-11 DIAGNOSIS — R5081 Fever presenting with conditions classified elsewhere: Secondary | ICD-10-CM

## 2022-02-11 DIAGNOSIS — L89899 Pressure ulcer of other site, unspecified stage: Secondary | ICD-10-CM | POA: Diagnosis present

## 2022-02-11 DIAGNOSIS — E1129 Type 2 diabetes mellitus with other diabetic kidney complication: Secondary | ICD-10-CM

## 2022-02-11 DIAGNOSIS — R5381 Other malaise: Secondary | ICD-10-CM | POA: Diagnosis present

## 2022-02-11 DIAGNOSIS — Z794 Long term (current) use of insulin: Secondary | ICD-10-CM

## 2022-02-11 DIAGNOSIS — E7800 Pure hypercholesterolemia, unspecified: Secondary | ICD-10-CM | POA: Diagnosis not present

## 2022-02-11 DIAGNOSIS — M109 Gout, unspecified: Secondary | ICD-10-CM | POA: Diagnosis present

## 2022-02-11 DIAGNOSIS — K219 Gastro-esophageal reflux disease without esophagitis: Secondary | ICD-10-CM | POA: Diagnosis present

## 2022-02-11 DIAGNOSIS — D701 Agranulocytosis secondary to cancer chemotherapy: Secondary | ICD-10-CM | POA: Diagnosis present

## 2022-02-11 DIAGNOSIS — Z1152 Encounter for screening for COVID-19: Secondary | ICD-10-CM | POA: Diagnosis not present

## 2022-02-11 DIAGNOSIS — R0902 Hypoxemia: Secondary | ICD-10-CM | POA: Diagnosis present

## 2022-02-11 DIAGNOSIS — K7581 Nonalcoholic steatohepatitis (NASH): Secondary | ICD-10-CM | POA: Diagnosis present

## 2022-02-11 DIAGNOSIS — E1165 Type 2 diabetes mellitus with hyperglycemia: Secondary | ICD-10-CM | POA: Diagnosis not present

## 2022-02-11 DIAGNOSIS — E871 Hypo-osmolality and hyponatremia: Secondary | ICD-10-CM | POA: Diagnosis present

## 2022-02-11 LAB — CBC
HCT: 30.6 % — ABNORMAL LOW (ref 36.0–46.0)
Hemoglobin: 10.3 g/dL — ABNORMAL LOW (ref 12.0–15.0)
MCH: 29.7 pg (ref 26.0–34.0)
MCHC: 33.7 g/dL (ref 30.0–36.0)
MCV: 88.2 fL (ref 80.0–100.0)
Platelets: 68 10*3/uL — ABNORMAL LOW (ref 150–400)
RBC: 3.47 MIL/uL — ABNORMAL LOW (ref 3.87–5.11)
RDW: 13.2 % (ref 11.5–15.5)
WBC: 0.4 10*3/uL — CL (ref 4.0–10.5)
nRBC: 5.1 % — ABNORMAL HIGH (ref 0.0–0.2)

## 2022-02-11 LAB — COMPREHENSIVE METABOLIC PANEL
ALT: 61 U/L — ABNORMAL HIGH (ref 0–44)
AST: 44 U/L — ABNORMAL HIGH (ref 15–41)
Albumin: 3.1 g/dL — ABNORMAL LOW (ref 3.5–5.0)
Alkaline Phosphatase: 129 U/L — ABNORMAL HIGH (ref 38–126)
Anion gap: 9 (ref 5–15)
BUN: 12 mg/dL (ref 8–23)
CO2: 23 mmol/L (ref 22–32)
Calcium: 8.4 mg/dL — ABNORMAL LOW (ref 8.9–10.3)
Chloride: 100 mmol/L (ref 98–111)
Creatinine, Ser: 0.9 mg/dL (ref 0.44–1.00)
GFR, Estimated: >60 mL/min (ref >60)
Glucose, Bld: 340 mg/dL — ABNORMAL HIGH (ref 70–99)
Potassium: 3.4 mmol/L — ABNORMAL LOW (ref 3.5–5.1)
Sodium: 132 mmol/L — ABNORMAL LOW (ref 135–145)
Total Bilirubin: 1.2 mg/dL (ref 0.3–1.2)
Total Protein: 6.3 g/dL — ABNORMAL LOW (ref 6.5–8.1)

## 2022-02-11 LAB — CBG MONITORING, ED
Glucose-Capillary: 312 mg/dL — ABNORMAL HIGH (ref 70–99)
Glucose-Capillary: 317 mg/dL — ABNORMAL HIGH (ref 70–99)
Glucose-Capillary: 305 mg/dL — ABNORMAL HIGH (ref 70–99)

## 2022-02-11 LAB — GLUCOSE, CAPILLARY: Glucose-Capillary: 348 mg/dL — ABNORMAL HIGH (ref 70–99)

## 2022-02-11 MED ORDER — AMLODIPINE BESYLATE 10 MG PO TABS
10.0000 mg | ORAL_TABLET | Freq: Every day | ORAL | Status: DC
  Administered 2022-02-11 – 2022-02-13 (×3): 10 mg via ORAL
  Filled 2022-02-11 (×3): qty 1

## 2022-02-11 MED ORDER — ALLOPURINOL 100 MG PO TABS
100.0000 mg | ORAL_TABLET | Freq: Every day | ORAL | Status: DC
  Administered 2022-02-11 – 2022-02-13 (×3): 100 mg via ORAL
  Filled 2022-02-11 (×4): qty 1

## 2022-02-11 MED ORDER — SODIUM CHLORIDE 0.9 % IV SOLN
2.0000 g | Freq: Three times a day (TID) | INTRAVENOUS | Status: DC
  Administered 2022-02-11 – 2022-02-12 (×4): 2 g via INTRAVENOUS
  Filled 2022-02-11 (×5): qty 12.5

## 2022-02-11 MED ORDER — INSULIN GLARGINE-YFGN 100 UNIT/ML ~~LOC~~ SOLN
25.0000 [IU] | Freq: Two times a day (BID) | SUBCUTANEOUS | Status: DC
  Administered 2022-02-11 – 2022-02-13 (×5): 25 [IU] via SUBCUTANEOUS
  Filled 2022-02-11 (×5): qty 0.25

## 2022-02-11 MED ORDER — GUAIFENESIN ER 600 MG PO TB12: 600.0000 mg | ORAL_TABLET | Freq: Two times a day (BID) | ORAL | Status: DC | PRN

## 2022-02-11 MED ORDER — INSULIN ASPART 100 UNIT/ML IJ SOLN
0.0000 [IU] | Freq: Every day | INTRAMUSCULAR | Status: DC
  Administered 2022-02-11: 4 [IU] via SUBCUTANEOUS
  Filled 2022-02-11: qty 0.05

## 2022-02-11 MED ORDER — SIMETHICONE 80 MG PO CHEW: 80.0000 mg | CHEWABLE_TABLET | Freq: Four times a day (QID) | ORAL | Status: DC | PRN

## 2022-02-11 MED ORDER — ONDANSETRON HCL 4 MG/2ML IJ SOLN: 4.0000 mg | Freq: Four times a day (QID) | INTRAMUSCULAR | Status: DC | PRN

## 2022-02-11 MED ORDER — FAMOTIDINE 20 MG PO TABS
20.0000 mg | ORAL_TABLET | Freq: Two times a day (BID) | ORAL | Status: DC
  Administered 2022-02-11 – 2022-02-13 (×5): 20 mg via ORAL
  Filled 2022-02-11 (×5): qty 1

## 2022-02-11 MED ORDER — LORATADINE 10 MG PO TABS
10.0000 mg | ORAL_TABLET | Freq: Every day | ORAL | Status: DC
  Administered 2022-02-11 – 2022-02-13 (×3): 10 mg via ORAL
  Filled 2022-02-11 (×3): qty 1

## 2022-02-11 MED ORDER — HYDROMORPHONE HCL 1 MG/ML IJ SOLN: 0.5000 mg | INTRAMUSCULAR | Status: DC | PRN

## 2022-02-11 MED ORDER — SODIUM CHLORIDE 0.9 % IV SOLN: INTRAVENOUS | Status: AC

## 2022-02-11 MED ORDER — BOOST / RESOURCE BREEZE PO LIQD CUSTOM
1.0000 | Freq: Two times a day (BID) | ORAL | Status: DC
  Administered 2022-02-12 – 2022-02-13 (×3): 1 via ORAL

## 2022-02-11 MED ORDER — BOOST PO LIQD: 237.0000 mL | Freq: Two times a day (BID) | ORAL | Status: DC

## 2022-02-11 MED ORDER — POTASSIUM CHLORIDE 10 MEQ/100ML IV SOLN
10.0000 meq | INTRAVENOUS | Status: AC
  Administered 2022-02-11 (×4): 10 meq via INTRAVENOUS
  Filled 2022-02-11 (×4): qty 100

## 2022-02-11 MED ORDER — ONDANSETRON HCL 4 MG PO TABS: 4.0000 mg | ORAL_TABLET | Freq: Four times a day (QID) | ORAL | Status: DC | PRN

## 2022-02-11 MED ORDER — ALBUTEROL SULFATE HFA 108 (90 BASE) MCG/ACT IN AERS: 2.0000 | INHALATION_SPRAY | RESPIRATORY_TRACT | Status: DC | PRN

## 2022-02-11 MED ORDER — POLYETHYL GLYCOL-PROPYL GLYCOL 0.4-0.3 % OP SOLN: 2.0000 [drp] | Freq: Two times a day (BID) | OPHTHALMIC | Status: DC

## 2022-02-11 MED ORDER — CARVEDILOL 6.25 MG PO TABS
6.2500 mg | ORAL_TABLET | Freq: Two times a day (BID) | ORAL | Status: DC
  Administered 2022-02-11 – 2022-02-13 (×5): 6.25 mg via ORAL
  Filled 2022-02-11 (×5): qty 1

## 2022-02-11 MED ORDER — VANCOMYCIN HCL IN DEXTROSE 1-5 GM/200ML-% IV SOLN
1000.0000 mg | INTRAVENOUS | Status: DC
  Administered 2022-02-12: 1000 mg via INTRAVENOUS
  Filled 2022-02-11: qty 200

## 2022-02-11 MED ORDER — HYDROXYZINE HCL 25 MG PO TABS
25.0000 mg | ORAL_TABLET | Freq: Two times a day (BID) | ORAL | Status: DC
  Administered 2022-02-11 – 2022-02-13 (×5): 25 mg via ORAL
  Filled 2022-02-11 (×5): qty 1

## 2022-02-11 MED ORDER — ACETAMINOPHEN 325 MG PO TABS
650.0000 mg | ORAL_TABLET | Freq: Four times a day (QID) | ORAL | Status: DC | PRN
  Administered 2022-02-11 – 2022-02-13 (×4): 650 mg via ORAL
  Filled 2022-02-11 (×4): qty 2

## 2022-02-11 MED ORDER — INSULIN ASPART 100 UNIT/ML IJ SOLN
0.0000 [IU] | Freq: Three times a day (TID) | INTRAMUSCULAR | Status: DC
  Administered 2022-02-11: 11 [IU] via SUBCUTANEOUS
  Filled 2022-02-11: qty 0.15

## 2022-02-11 MED ORDER — ALBUTEROL SULFATE (2.5 MG/3ML) 0.083% IN NEBU: 2.5000 mg | INHALATION_SOLUTION | RESPIRATORY_TRACT | Status: DC | PRN

## 2022-02-11 MED ORDER — INSULIN ASPART 100 UNIT/ML IJ SOLN
0.0000 [IU] | INTRAMUSCULAR | Status: DC
  Administered 2022-02-11 (×2): 15 [IU] via SUBCUTANEOUS
  Administered 2022-02-12: 3 [IU] via SUBCUTANEOUS
  Administered 2022-02-12: 15 [IU] via SUBCUTANEOUS
  Administered 2022-02-12 (×3): 7 [IU] via SUBCUTANEOUS
  Administered 2022-02-12 – 2022-02-13 (×2): 11 [IU] via SUBCUTANEOUS
  Administered 2022-02-13 (×3): 7 [IU] via SUBCUTANEOUS
  Filled 2022-02-11: qty 0.2

## 2022-02-11 MED ORDER — DOXEPIN HCL 25 MG PO CAPS
25.0000 mg | ORAL_CAPSULE | Freq: Every day | ORAL | Status: DC
  Administered 2022-02-11 – 2022-02-13 (×3): 25 mg via ORAL
  Filled 2022-02-11 (×3): qty 1

## 2022-02-11 MED ORDER — POLYVINYL ALCOHOL 1.4 % OP SOLN
1.0000 [drp] | Freq: Two times a day (BID) | OPHTHALMIC | Status: DC
  Administered 2022-02-11 – 2022-02-13 (×5): 1 [drp] via OPHTHALMIC
  Filled 2022-02-11: qty 15

## 2022-02-11 MED ORDER — ACETAMINOPHEN 650 MG RE SUPP: 650.0000 mg | Freq: Four times a day (QID) | RECTAL | Status: DC | PRN

## 2022-02-11 NOTE — Assessment & Plan Note (Addendum)
Home med rec pending Continue chronic psych meds as long as none of them have neutropenia or agranulocytosis as possible side effects (dont see any of these on her prior med list though).

## 2022-02-11 NOTE — H&P (Signed)
History and Physical    Patient: Heather Petty ZOX:[id number] DOB: 17-Apr-1950 DOA: 02/10/2022 DOS: the patient was seen and examined on 02/11/2022 PCP: Pcp, No  Patient coming from: SNF  Chief Complaint:  Chief Complaint  Patient presents with   Nausea   Abdominal Pain   HPI: Heather Petty is a 72 y.o. female with medical history significant of DM2, schizophrenia, Hodgkin lymphoma currently on chemotherapy with most recent dose 12/26.  Pt presents to ED with vomiting, abd pain.  Symptoms onset today.  Periumbilical abd pain and low back pain.  No CP no sore throat.  Having fever and rather severe chills.   Review of Systems: As mentioned in the history of present illness. All other systems reviewed and are negative. Past Medical History:  Diagnosis Date   AKI (acute kidney injury) ([place]) 12/17/2017   Allergic rhinitis    Anemia    Arthritis    Asthma    Autonomic neuropathy    Depression    Diabetes mellitus    Diabetes mellitus without complication (HCC)    E coli infection    GERD (gastroesophageal reflux disease)    GERD (gastroesophageal reflux disease)    Gout    Hepatitis A    High cholesterol    Hypertension    Insomnia    Kidney failure    Metabolic encephalopathy    Morbid obesity ([place])    NASH (nonalcoholic steatohepatitis)    Pneumonia 12/17/2017   Schizophrenia ([place])    Stroke ([place])    Vitamin B deficiency    Vitamin D deficiency    Past Surgical History:  Procedure Laterality Date   CYST EXCISION     buttucks   DENTAL SURGERY     IR IMAGING GUIDED PORT INSERTION  01/10/2022   TONSILLECTOMY     Social History:  reports that she has never smoked. She has never used smokeless tobacco. She reports that she does not drink alcohol and does not use drugs.  Allergies  Allergen Reactions   Emend [Aprepitant] Other (See Comments)    Pt experienced shooting back pain at 10/10 when medication was administered the first time. See Hypersensitivity note  from 01/17/2022.   Vancomycin Other (See Comments)    Patient becomes hypotensive, lethargic and itchy    Fosaprepitant Other (See Comments)    Back pain / hypersensitivity 01/17/2022   Lasix [Furosemide] Other (See Comments)    IV-LASIX ==> Reaction: Burning   Lasix [Furosemide] Other (See Comments)    On MAR    Family History  Adopted: Yes  Problem Relation Age of Onset   COPD Daughter     Prior to Admission medications   Medication Sig Start Date End Date Taking? Authorizing Provider  albuterol (VENTOLIN HFA) 108 (90 Base) MCG/ACT inhaler Inhale 2 puffs into the lungs every 4 (four) hours as needed for wheezing or shortness of breath.    [provider]  allopurinol (ZYLOPRIM) 300 MG tablet Take 300 mg by mouth daily.    [provider]  amLODipine (NORVASC) 10 MG tablet Take 10 mg by mouth daily.    [provider]  ammonium lactate (LAC-HYDRIN) 12 % lotion Apply 1 application  topically daily as needed for dry skin.    [provider]  carvedilol (COREG) 6.25 MG tablet Take 6.25 mg by mouth 2 (two) times daily with a meal.    [provider]  Dextromethorphan-guaiFENesin 5-100 MG/5ML LIQD Take 10 mLs by mouth every 6 (six) hours as  needed.    [provider]  doxepin (SINEQUAN) 25 MG capsule Take 25 mg by mouth at bedtime.    [provider]  famotidine (PEPCID) 20 MG tablet Take 20 mg by mouth 2 (two) times daily.    [provider]  glipiZIDE (GLUCOTROL XL) 10 MG 24 hr tablet Take 10 mg by mouth 2 (two) times daily.     [provider]  HYDROcodone-acetaminophen (NORCO/VICODIN) 5-325 MG tablet Take 1 tablet by mouth 2 (two) times daily.    [provider]  hydrOXYzine (ATARAX/VISTARIL) 25 MG tablet Take 25 mg by mouth in the morning and at bedtime.    [provider]  insulin aspart (NOVOLOG) 100 UNIT/ML injection Inject 0-12 Units into the skin See admin instructions. Inject 0-12  units into the skin 4 times a day (before meals and at bedtime) per sliding scale: BGL <60 or >400  = CALL MD; 60-200 = 0 units; 201-250 = 2 units; 251-300 = 4 units; 301-350 = 6 units; 351-400 = 8 units; 401-450 = 10 units; >450 = 12 units 07/31/19   Annita Brod, MD  insulin glargine (LANTUS) 100 UNIT/ML injection Inject 0.1 mLs (10 Units total) into the skin at bedtime. Patient taking differently: Inject 22 Units into the skin at bedtime. 09/09/19   Pokhrel, Laxman, MD  lidocaine (LIDODERM) 5 % Place 1 patch onto the skin daily. Remove & Discard patch within 12 hours or as directed by MD    [provider]  lidocaine-prilocaine (EMLA) cream Apply 1 Application topically as needed. 01/16/22   Dede Query T, PA-C  lisinopril (ZESTRIL) 5 MG tablet Take 5 mg by mouth daily.    [provider]  loperamide (IMODIUM) 2 MG capsule Take 1 capsule (2 mg total) by mouth as needed for diarrhea or loose stools. 09/09/19   Pokhrel, Corrie Mckusick, MD  Polyethyl Glycol-Propyl Glycol (SYSTANE ULTRA) 0.4-0.3 % SOLN Place 2 drops into both eyes in the morning and at bedtime.    [provider]  polyethylene glycol (MIRALAX / GLYCOLAX) 17 g packet Take 17 g by mouth daily as needed for mild constipation.    [provider]  prochlorperazine (COMPAZINE) 10 MG tablet Take 1 tablet (10 mg total) by mouth every 6 (six) hours as needed for nausea or vomiting. 01/16/22   Dede Query T, PA-C  risperiDONE (RISPERDAL) 0.5 MG tablet Take 0.5 mg by mouth daily.    [provider]  Semaglutide,0.25 or 0.'5MG'$ /DOS, (OZEMPIC, 0.25 OR 0.5 MG/DOSE,) 2 MG/1.5ML SOPN Inject 0.5 mg into the skin once a week. On Sunday    [provider]  triamcinolone (NASACORT ALLERGY 24HR) 55 MCG/ACT AERO nasal inhaler Place 1 spray into the nose daily.    [provider]    Physical Exam: Vitals:   02/10/22 2230 02/10/22 2300 02/10/22 2315 02/10/22 2325  BP: (!) 169/88 (!) 133/99 (!)  152/67   Pulse: (!) 103 94 97   Resp: 20 (!) 25 (!) 21   Temp:    98.9 F (37.2 C)  TempSrc:    Oral  SpO2: 96% 98% 99%    Constitutional: Having chills right now Eyes: PERRL, lids and conjunctivae normal ENMT: Mucous membranes are moist. Posterior pharynx clear of any exudate or lesions.Normal dentition.  Neck: normal, supple, no masses, no thyromegaly Respiratory: clear to auscultation bilaterally, no wheezing, no crackles. Normal respiratory effort. No accessory muscle use.  Cardiovascular: Mild tachycardia Abdomen: Mild generalized TTP Musculoskeletal: no clubbing /  cyanosis. No joint deformity upper and lower extremities. Good ROM, no contractures. Normal muscle tone.  Skin: R leg decubitus ulcer, no exudate, surrounding erythema, crepitus, TTP. Neurologic: CN 2-12 grossly intact. Sensation intact, DTR normal. Strength 5/5 in all 4.  Psychiatric: Normal judgment and insight. Alert and oriented x 3. Normal mood.   Data Reviewed:       Latest Ref Rng & Units 02/10/2022    8:09 PM 01/31/2022    9:19 AM 01/17/2022   11:56 AM  CBC  WBC 4.0 - 10.5 K/uL 0.9  2.6  9.9   Hemoglobin 12.0 - 15.0 g/dL 11.4  11.4  12.5   Hematocrit 36.0 - 46.0 % 33.9  33.6  37.3   Platelets 150 - 400 K/uL 89  187  154      Assessment and Plan: * Febrile neutropenia (HCC) Pt with fever, chills, and an ANC of 0! Almost certainly secondary to recent and ongoing cytotoxic chemotherapy to treat her Hodgkin Lymphoma, includes vinblastine, doxorubicin, and dacarbazine (got all 3 of these on 12/26). EDP spoke with heme/onc Pt started on Granix, first dose given in ED No obvious source of infection on workup (presumably pt with translocation of GI tract bacteria causing febrile neutropenia today). Empiric cefepime Cultures pending Tele monitor Repeat labs in AM  Hodgkin lymphoma ([place]) Appears to be responding to chemo based on CT scan today.  Decubitus ulcer of right leg Doesn't appear infected on  exam today. Got 1 dose of vanc in addition to cefepime in ED Wound care consult  Paranoid schizophrenia [facility]) Home med rec pending Continue chronic psych meds as long as none of them have neutropenia or agranulocytosis as possible side effects (dont see any of these on her prior med list though).  HTN (hypertension) Holding off on home BP meds for the moment due to risk of progression to sepsis. Use short acting PRN meds if needed.  Type II diabetes mellitus with renal manifestations ([place]) Med rec pending Hold PO hypoglycemics Mod scale SSI AC/HS Add lantus based on how much she is taking at home (pharmacy awaiting fax still).      Advance Care Planning:   Code Status: Full Code  MOST form in chart  Consults: EDP spoke with heme/onc  Family Communication: No family in room  Severity of Illness: The appropriate patient status for this patient is INPATIENT. Inpatient status is judged to be reasonable and necessary in order to provide the required intensity of service to ensure the patient's safety. The patient's presenting symptoms, physical exam findings, and initial radiographic and laboratory data in the context of their chronic comorbidities is felt to place them at high risk for further clinical deterioration. Furthermore, it is not anticipated that the patient will be medically stable for discharge from the hospital within 2 midnights of admission.   * I certify that at the point of admission it is my clinical judgment that the patient will require inpatient hospital care spanning beyond 2 midnights from the point of admission due to high intensity of service, high risk for further deterioration and high frequency of surveillance required.*  Author: Etta Quill., DO 02/11/2022 1:58 AM  For on call review [email or web address].

## 2022-02-11 NOTE — Progress Notes (Signed)
PROGRESS NOTE    Heather Petty  ZOX:[id number] DOB: 1950/11/18 DOA: 02/10/2022 PCP: Pcp, No   Brief Narrative:    Heather Petty is a 72 y.o. female with medical history significant of DM2, schizophrenia, Hodgkin lymphoma currently on chemotherapy with most recent dose 12/26. Pt presents to ED with vomiting, abd pain.  Symptoms onset today.  Periumbilical abd pain and low back pain.  No CP no sore throat.  Having fever and rather severe chills.  He was admitted for febrile neutropenia and started on Granix and is empirically on cefepime with cultures pending.  Assessment & Plan:   Principal Problem:   Febrile neutropenia (HCC) Active Problems:   Type II diabetes mellitus with renal manifestations (HCC)   HTN (hypertension)   Paranoid schizophrenia ([place])   Decubitus ulcer of right leg   Hodgkin lymphoma (HCC)  Assessment and Plan:  Febrile neutropenia ([place]) Pt with fever, chills, and an ANC of 0! Almost certainly secondary to recent and ongoing cytotoxic chemotherapy to treat her Hodgkin Lymphoma, includes vinblastine, doxorubicin, and dacarbazine (got all 3 of these on 12/26). EDP spoke with heme/onc Pt started on Granix, first dose given in ED, continue daily No obvious source of infection on workup (presumably pt with translocation of GI tract bacteria causing febrile neutropenia today). Empiric cefepime and vancomycin Cultures pending Tele monitor Repeat labs in AM Continue daily Granix until [place] crosses 1000   Hodgkin lymphoma ([place]) Appears to be responding to chemo based on CT scan today.  Mild hypokalemia Replete IV and reevaluate in a.m.   Decubitus ulcer of right leg Doesn't appear infected on exam today. Got 1 dose of vanc in addition to cefepime in ED Wound care consult   Paranoid schizophrenia [facility]) Home med rec pending Continue chronic psych meds as long as none of them have neutropenia or agranulocytosis as possible side effects (dont see any of these  on her prior med list though).   HTN (hypertension) Holding off on home BP meds for the moment due to risk of progression to sepsis. Use short acting PRN meds if needed.   Type II diabetes mellitus with renal manifestations (HCC) with hyperglycemia Med rec pending Hold PO hypoglycemics Mod scale SSI AC/HS Add Semglee coverage  Abdominal pain with mild transaminitis Continue to follow along LFTs CT abdomen with no acute findings Zofran as needed for nausea or vomiting    DVT prophylaxis: SCDs Code Status: Full Family Communication: None at bedside Disposition Plan:  Status is: Inpatient Remains inpatient appropriate because: Continues to remain on IV medications.   Consultants:  Heme/Onc  Procedures:  None  Antimicrobials:  Anti-infectives (From admission, onward)    Start     Dose/Rate Route Frequency Ordered Stop   02/11/22 0400  ceFEPIme (MAXIPIME) 2 g in sodium chloride 0.9 % 100 mL IVPB        2 g 200 mL/hr over 30 Minutes Intravenous Every 8 hours 02/11/22 0149     02/10/22 2100  vancomycin (VANCOCIN) IVPB 1000 mg/200 mL premix        1,000 mg 100 mL/hr over 120 Minutes Intravenous  Once 02/10/22 2059 02/10/22 2334   02/10/22 2030  ceFEPIme (MAXIPIME) 2 g in sodium chloride 0.9 % 100 mL IVPB        2 g 200 mL/hr over 30 Minutes Intravenous  Once 02/10/22 2020 02/10/22 2224   02/10/22 2030  metroNIDAZOLE (FLAGYL) IVPB 500 mg        500 mg 100 mL/hr over 60  Minutes Intravenous  Once 02/10/22 2020 02/10/22 2132       Subjective: Patient seen and evaluated today with ongoing abdominal pain with nausea and vomiting noted.  Denies any further fevers or chills.  Objective: Vitals:   02/10/22 2325 02/11/22 0327 02/11/22 0600 02/11/22 0630  BP:  (!) 152/64 (!) 169/75 (!) 143/75  Pulse:  (!) 101 (!) 108 (!) 110  Resp:  20 (!) 32 (!) 27  Temp: 98.9 F (37.2 C) 99.3 F (37.4 C)    TempSrc: Oral     SpO2:  92% 96% 90%    Intake/Output Summary (Last 24  hours) at 02/11/2022 0735 Last data filed at 02/11/2022 3903 Gross per 24 hour  Intake 1371.79 ml  Output --  Net 1371.79 ml   There were no vitals filed for this visit.  Examination:  General exam: Appears calm and comfortable  Respiratory system: Clear to auscultation. Respiratory effort normal. Cardiovascular system: S1 & S2 heard, RRR.  Gastrointestinal system: Abdomen is soft Central nervous system: Alert and awake Extremities: No edema Skin: No significant lesions noted Psychiatry: Flat affect.    Data Reviewed: I have personally reviewed following labs and imaging studies  CBC: Recent Labs  Lab 02/10/22 2009 02/11/22 0340  WBC 0.9* 0.4*  NEUTROABS 0.0*  --   HGB 11.4* 10.3*  HCT 33.9* 30.6*  MCV 88.1 88.2  PLT 89* 68*   Basic Metabolic Panel: Recent Labs  Lab 02/10/22 2009 02/11/22 0340  NA 134* 132*  K 3.5 3.4*  CL 99 100  CO2 24 23  GLUCOSE 333* 340*  BUN 11 12  CREATININE 0.77 0.90  CALCIUM 9.2 8.4*   GFR: CrCl cannot be calculated (Unknown ideal weight.). Liver Function Tests: Recent Labs  Lab 02/10/22 2009 02/11/22 0340  AST 47* 44*  ALT 67* 61*  ALKPHOS 147* 129*  BILITOT 1.3* 1.2  PROT 7.3 6.3*  ALBUMIN 3.9 3.1*   Recent Labs  Lab 02/10/22 2009  LIPASE 26   No results for input(s): "AMMONIA" in the last 168 hours. Coagulation Profile: Recent Labs  Lab 02/10/22 2009  INR 1.1   Cardiac Enzymes: No results for input(s): "CKTOTAL", "CKMB", "CKMBINDEX", "TROPONINI" in the last 168 hours. BNP (last 3 results) No results for input(s): "PROBNP" in the last 8760 hours. HbA1C: No results for input(s): "HGBA1C" in the last 72 hours. CBG: Recent Labs  Lab 02/11/22 0205  GLUCAP 312*   Lipid Profile: No results for input(s): "CHOL", "HDL", "LDLCALC", "TRIG", "CHOLHDL", "LDLDIRECT" in the last 72 hours. Thyroid Function Tests: No results for input(s): "TSH", "T4TOTAL", "FREET4", "T3FREE", "THYROIDAB" in the last 72 hours. Anemia  Panel: No results for input(s): "VITAMINB12", "FOLATE", "FERRITIN", "TIBC", "IRON", "RETICCTPCT" in the last 72 hours. Sepsis Labs: Recent Labs  Lab 02/10/22 2009 02/10/22 2252  LATICACIDVEN 1.3 1.2    Recent Results (from the past 240 hour(s))  Blood Culture (routine x 2)     Status: None (Preliminary result)   Collection Time: 02/10/22  8:10 PM   Specimen: BLOOD RIGHT FOREARM  Result Value Ref Range Status   Specimen Description   Final    BLOOD RIGHT FOREARM Performed at [facility], [place] [address], [place], [place] [address]    Special Requests   Final    BOTTLES DRAWN AEROBIC AND ANAEROBIC Blood Culture results may not be optimal due to an inadequate volume of blood received in culture bottles Performed at [facility] [address] [place], [place]  [address]    Culture PENDING  Incomplete   Report Status PENDING  Incomplete  Resp panel by RT-PCR (RSV, Flu A&B, Covid) Anterior Nasal Swab     Status: None   Collection Time: 02/10/22  8:28 PM   Specimen: Anterior Nasal Swab  Result Value Ref Range Status   SARS Coronavirus 2 by RT PCR NEGATIVE NEGATIVE Final    Comment: (NOTE) SARS-CoV-2 target nucleic acids are NOT DETECTED.  The SARS-CoV-2 RNA is generally detectable in upper respiratory specimens during the acute phase of infection. The lowest concentration of SARS-CoV-2 viral copies this assay can detect is 138 copies/mL. A negative result does not preclude SARS-Cov-2 infection and should not be used as the sole basis for treatment or other patient management decisions. A negative result may occur with  improper specimen collection/handling, submission of specimen other than nasopharyngeal swab, presence of viral mutation(s) within the areas targeted by this assay, and inadequate number of viral copies(<138 copies/mL). A negative result must be combined with clinical observations, patient history, and epidemiological information. The  expected result is Negative.  Fact Sheet for Patients:  EntrepreneurPulse.com.au  Fact Sheet for Healthcare Providers:  IncredibleEmployment.be  This test is no t yet approved or cleared by the [place] FDA and  has been authorized for detection and/or diagnosis of SARS-CoV-2 by FDA under an Emergency Use Authorization (EUA). This EUA will remain  in effect (meaning this test can be used) for the duration of the COVID-19 declaration under Section 564(b)(1) of the Act, 21 U.S.C.section 360bbb-3(b)(1), unless the authorization is terminated  or revoked sooner.       Influenza A by PCR NEGATIVE NEGATIVE Final   Influenza B by PCR NEGATIVE NEGATIVE Final    Comment: (NOTE) The Xpert Xpress SARS-CoV-2/FLU/RSV plus assay is intended as an aid in the diagnosis of influenza from Nasopharyngeal swab specimens and should not be used as a sole basis for treatment. Nasal washings and aspirates are unacceptable for Xpert Xpress SARS-CoV-2/FLU/RSV testing.  Fact Sheet for Patients: EntrepreneurPulse.com.au  Fact Sheet for Healthcare Providers: IncredibleEmployment.be  This test is not yet approved or cleared by the [place] FDA and has been authorized for detection and/or diagnosis of SARS-CoV-2 by FDA under an Emergency Use Authorization (EUA). This EUA will remain in effect (meaning this test can be used) for the duration of the COVID-19 declaration under Section 564(b)(1) of the Act, 21 U.S.C. section 360bbb-3(b)(1), unless the authorization is terminated or revoked.     Resp Syncytial Virus by PCR NEGATIVE NEGATIVE Final    Comment: (NOTE) Fact Sheet for Patients: EntrepreneurPulse.com.au  Fact Sheet for Healthcare Providers: IncredibleEmployment.be  This test is not yet approved or cleared by the [place] FDA and has been authorized for detection and/or  diagnosis of SARS-CoV-2 by FDA under an Emergency Use Authorization (EUA). This EUA will remain in effect (meaning this test can be used) for the duration of the COVID-19 declaration under Section 564(b)(1) of the Act, 21 U.S.C. section 360bbb-3(b)(1), unless the authorization is terminated or revoked.  Performed at [facility], [place] [address] [place], [place] [address]          Radiology Studies: US Abdomen Limited RUQ (LIVER/GB)  Result Date: 02/10/2022 CLINICAL DATA:  [id number] Abdominal pain [id number] EXAM: ULTRASOUND ABDOMEN LIMITED COMPARISON:  11/06/2019 FINDINGS: The liver demonstrates increased parenchymal echogenicity consistent with fatty infiltration. No focal hepatic parenchymal lesions or intrahepatic ductal dilatation. Layering small shadowing gallstones. No Wall thickening or pericholecystic fluid. CBD  measured 0.5cm. IMPRESSION: Hepatic fatty infiltration.  Cholelithiasis. Electronically Signed   By: Sammie Bench M.D.   On: 02/10/2022 22:47   CT ABDOMEN PELVIS W CONTRAST  Result Date: 02/10/2022 CLINICAL DATA:  Hodgkin's lymphoma, vomiting EXAM: CT ABDOMEN AND PELVIS WITH CONTRAST TECHNIQUE: Multidetector CT imaging of the abdomen and pelvis was performed using the standard protocol following bolus administration of intravenous contrast. RADIATION DOSE REDUCTION: This exam was performed according to the departmental dose-optimization program which includes automated exposure control, adjustment of the mA and/or kV according to patient size and/or use of iterative reconstruction technique. CONTRAST:  100 mL OMNIPAQUE IOHEXOL 300 MG/ML  SOLN COMPARISON:  12/09/2021 and 07/21/2019. FINDINGS: Lower chest: Atheromatous changes of coronary arteries and aorta. No pleural or pericardial effusion. Stable scarring identified lingula with a 1 cm density noted to be stable. Hepatobiliary: No lesions identified in the liver. No intrahepatic ductal dilatation. Gallbladder  contains small calcified stones or layering milk of calcium. Pancreas: Fatty atrophy.  No focal lesions identified Spleen: Normal in size without focal abnormality. Adrenals/Urinary Tract: Adrenal glands are unremarkable. Kidneys are normal, without renal calculi, focal lesion, or hydronephrosis. Bladder is unremarkable. Stomach/Bowel: No focal abnormalities are identified in the stomach. No bowel dilatation to suggest obstruction. Normal appendix. Diverticulosis descending and sigmoid colon. Vascular/Lymphatic: Dense atheromatous calcifications of the aorta and its branches. Para-aortic adenopathy identified with the largest lesion now measuring 3.0 x 1.6 cm compared to 3.6 x 2.1 cm previously. Additional retroperitoneal nodes diminished in size and number compared to prior study. Reproductive: Uterus and bilateral adnexa are unremarkable. Other: No abdominal wall hernia or abnormality. No abdominopelvic ascites. Musculoskeletal: Extensive thoracolumbosacral degenerative changes. IMPRESSION: 1. Hypermetabolic retroperitoneal adenopathy less prominent than on prior study. 2. Diverticulosis. 3. No acute abdominal or pelvic pathology was identified. Electronically Signed   By: Sammie Bench M.D.   On: 02/10/2022 21:56   DG Chest Port 1 View  Result Date: 02/10/2022 CLINICAL DATA:  History of breast cancer and stomach cancer presenting with vomiting. EXAM: PORTABLE CHEST 1 VIEW COMPARISON:  September 04, 2019 FINDINGS: There is stable right-sided venous Port-A-Cath positioning. The heart size and mediastinal contours are within normal limits. There is moderate severity calcification of the aortic arch. There is no evidence of acute infiltrate, pleural effusion or pneumothorax. The visualized skeletal structures are unremarkable. IMPRESSION: No active cardiopulmonary disease. Electronically Signed   By: Virgina Norfolk M.D.   On: 02/10/2022 21:45        Scheduled Meds:   HYDROmorphone (DILAUDID) injection  0.5  mg Intravenous Once   insulin aspart  0-15 Units Subcutaneous TID WC   insulin aspart  0-5 Units Subcutaneous QHS   Tbo-filgastrim (GRANIX) SQ  480 mcg Subcutaneous q1800   Continuous Infusions:  ceFEPime (MAXIPIME) IV 2 g (02/11/22 0342)     LOS: 0 days    Time spent: 35 minutes    Yusuf Yu Darleen Crocker, DO [facility]  If 7PM-7AM, please contact night-coverage [email or web address] 02/11/2022, 7:35 AM

## 2022-02-11 NOTE — Assessment & Plan Note (Signed)
Appears to be responding to chemo based on CT scan today.

## 2022-02-11 NOTE — Assessment & Plan Note (Signed)
Doesn't appear infected on exam today. Got 1 dose of vanc in addition to cefepime in ED Wound care consult

## 2022-02-11 NOTE — Assessment & Plan Note (Signed)
Holding off on home BP meds for the moment due to risk of progression to sepsis. Use short acting PRN meds if needed.

## 2022-02-11 NOTE — Progress Notes (Signed)
Pharmacy Antibiotic Note  Heather Petty is a 72 y.o. female admitted on 02/10/2022 with  febrile neutropenia .  Pharmacy has been consulted for Cefepime dosing.  Plan: Cefepime 2gm IV q8h No dose adjustments anticipated.  Pharmacy will sign off and monitor peripherally via electronic surveillance software for any changes in renal function or micro data.      Temp (24hrs), Avg:100 F (37.8 C), Min:98.9 F (37.2 C), Max:101 F (38.3 C)  Recent Labs  Lab 02/10/22 2009 02/10/22 2252  WBC 0.9*  --   CREATININE 0.77  --   LATICACIDVEN 1.3 1.2    CrCl cannot be calculated (Unknown ideal weight.).    Allergies  Allergen Reactions   Emend [Aprepitant] Other (See Comments)    Pt experienced shooting back pain at 10/10 when medication was administered the first time. See Hypersensitivity note from 01/17/2022.   Vancomycin Other (See Comments)    Patient becomes hypotensive, lethargic and itchy    Fosaprepitant Other (See Comments)    Back pain / hypersensitivity 01/17/2022   Lasix [Furosemide] Other (See Comments)    IV-LASIX ==> Reaction: Burning   Lasix [Furosemide] Other (See Comments)    On MAR    Thank you for allowing pharmacy to be a part of this patient's care.  Netta Cedars PharmD 02/11/2022 1:47 AM

## 2022-02-11 NOTE — Assessment & Plan Note (Addendum)
Pt with fever, chills, and an ANC of 0! Almost certainly secondary to recent and ongoing cytotoxic chemotherapy to treat her Hodgkin Lymphoma, includes vinblastine, doxorubicin, and dacarbazine (got all 3 of these on 12/26). EDP spoke with heme/onc Pt started on Granix, first dose given in ED No obvious source of infection on workup (presumably pt with translocation of GI tract bacteria causing febrile neutropenia today). Empiric cefepime Cultures pending Tele monitor Repeat labs in AM

## 2022-02-11 NOTE — Plan of Care (Signed)
  Problem: Coping: Goal: Ability to adjust to condition or change in health will improve Outcome: Progressing   Problem: Metabolic: Goal: Ability to maintain appropriate glucose levels will improve Outcome: Progressing   Problem: Skin Integrity: Goal: Risk for impaired skin integrity will decrease Outcome: Progressing   Problem: Tissue Perfusion: Goal: Adequacy of tissue perfusion will improve Outcome: Progressing   Problem: Education: Goal: Knowledge of General Education information will improve Description: Including pain rating scale, medication(s)/side effects and non-pharmacologic comfort measures Outcome: Progressing

## 2022-02-11 NOTE — Progress Notes (Signed)
Pharmacy Antibiotic Note  Heather Petty is a 72 y.o. female admitted on 02/10/2022 with  FN .  Pharmacy has been consulted for vanc dosing to be added to cefepime  Plan: Vanc 1g VI q24 - goal AUC 400-550  Height: '5\' 1"'$  (154.9 cm) Weight: 97.5 kg (215 lb) IBW/kg (Calculated) : 47.8  Temp (24hrs), Avg:99.9 F (37.7 C), Min:98.9 F (37.2 C), Max:101 F (38.3 C)  Recent Labs  Lab 02/10/22 2009 02/10/22 2252 02/11/22 0340  WBC 0.9*  --  0.4*  CREATININE 0.77  --  0.90  LATICACIDVEN 1.3 1.2  --     Estimated Creatinine Clearance: 59.5 mL/min (by C-G formula based on SCr of 0.9 mg/dL).    Allergies  Allergen Reactions   Emend [Aprepitant] Other (See Comments)    Pt experienced shooting back pain at 10/10 when medication was administered the first time. See Hypersensitivity note from 01/17/2022.   Vancomycin Other (See Comments)    Patient becomes hypotensive, lethargic and itchy    Fosaprepitant Other (See Comments)    Back pain / hypersensitivity 01/17/2022   Lasix [Furosemide] Other (See Comments)    IV-LASIX ==> Reaction: Burning   Lasix [Furosemide] Other (See Comments)    On MAR      Thank you for allowing pharmacy to be a part of this patient's care.  Kara Mead 02/11/2022 10:21 AM

## 2022-02-11 NOTE — Progress Notes (Signed)
Hematology-Oncology Patient was not seen.  Chart reviewed Chief complaint: Neutropenic fever following chemotherapy for Hodgkin's lymphoma with AVD on 01/31/2022 cycle 2     Latest Ref Rng & Units 02/11/2022    3:40 AM 02/10/2022    8:09 PM 01/31/2022    9:19 AM  CBC  WBC 4.0 - 10.5 K/uL 0.4  0.9  2.6   Hemoglobin 12.0 - 15.0 g/dL 10.3  11.4  11.4   Hematocrit 36.0 - 46.0 % 30.6  33.9  33.6   Platelets 150 - 400 K/uL 68  89  187   ANC: 0     Latest Ref Rng & Units 02/11/2022    3:40 AM 02/10/2022    8:09 PM 01/31/2022    9:19 AM  CMP  Glucose 70 - 99 mg/dL 340  333  280   BUN 8 - 23 mg/dL '12  11  15   '$ Creatinine 0.44 - 1.00 mg/dL 0.90  0.77  0.86   Sodium 135 - 145 mmol/L 132  134  139   Potassium 3.5 - 5.1 mmol/L 3.4  3.5  3.5   Chloride 98 - 111 mmol/L 100  99  103   CO2 22 - 32 mmol/L '23  24  28   '$ Calcium 8.9 - 10.3 mg/dL 8.4  9.2  9.3   Total Protein 6.5 - 8.1 g/dL 6.3  7.3  6.6   Total Bilirubin 0.3 - 1.2 mg/dL 1.2  1.3  0.5   Alkaline Phos 38 - 126 U/L 129  147  145   AST 15 - 41 U/L 44  47  16   ALT 0 - 44 U/L 61  67  24     Plan: Neutropenic fever: Agree with the current management of cefepime and vancomycin.  Once fever subsides, vancomycin can be discontinued.  Blood and urine cultures are pending.  Chest x-ray is clear Agree with G-CSF injections daily until [place] crosses 1000.  Based on my review patient did not get Neulasta. Hodgkin's lymphoma: Status post cycle 2 of AVD chemotherapy. Severe hyperglycemia also causing hyponatremia. Slight elevation of AST and ALT can be monitored. Abdominal pain: CT abdomen and pelvis: Retroperitoneal adenopathy less prominent than before.  Ultrasound of the abdomen and liver: Fatty liver Will follow along.

## 2022-02-11 NOTE — Assessment & Plan Note (Signed)
Med rec pending Hold PO hypoglycemics Mod scale SSI AC/HS Add lantus based on how much she is taking at home (pharmacy awaiting fax still).

## 2022-02-12 DIAGNOSIS — R5081 Fever presenting with conditions classified elsewhere: Secondary | ICD-10-CM | POA: Diagnosis not present

## 2022-02-12 DIAGNOSIS — D709 Neutropenia, unspecified: Secondary | ICD-10-CM | POA: Diagnosis not present

## 2022-02-12 DIAGNOSIS — D701 Agranulocytosis secondary to cancer chemotherapy: Secondary | ICD-10-CM | POA: Diagnosis not present

## 2022-02-12 LAB — CBC WITH DIFFERENTIAL/PLATELET
Abs Immature Granulocytes: 0.18 10*3/uL — ABNORMAL HIGH (ref 0.00–0.07)
Basophils Absolute: 0.1 10*3/uL (ref 0.0–0.1)
Basophils Relative: 2 %
Eosinophils Absolute: 0 10*3/uL (ref 0.0–0.5)
Eosinophils Relative: 1 %
HCT: 31.6 % — ABNORMAL LOW (ref 36.0–46.0)
Hemoglobin: 10.2 g/dL — ABNORMAL LOW (ref 12.0–15.0)
Immature Granulocytes: 8 %
Lymphocytes Relative: 24 %
Lymphs Abs: 0.5 10*3/uL — ABNORMAL LOW (ref 0.7–4.0)
MCH: 29.7 pg (ref 26.0–34.0)
MCHC: 32.3 g/dL (ref 30.0–36.0)
MCV: 92.1 fL (ref 80.0–100.0)
Monocytes Absolute: 0.4 10*3/uL (ref 0.1–1.0)
Monocytes Relative: 18 %
Neutro Abs: 1 10*3/uL — ABNORMAL LOW (ref 1.7–7.7)
Neutrophils Relative %: 47 %
Platelets: 64 10*3/uL — ABNORMAL LOW (ref 150–400)
RBC: 3.43 MIL/uL — ABNORMAL LOW (ref 3.87–5.11)
RDW: 13.9 % (ref 11.5–15.5)
WBC: 2.2 10*3/uL — ABNORMAL LOW (ref 4.0–10.5)
nRBC: 5.5 % — ABNORMAL HIGH (ref 0.0–0.2)

## 2022-02-12 LAB — CBC
HCT: 30.4 % — ABNORMAL LOW (ref 36.0–46.0)
Hemoglobin: 10 g/dL — ABNORMAL LOW (ref 12.0–15.0)
MCH: 30 pg (ref 26.0–34.0)
MCHC: 32.9 g/dL (ref 30.0–36.0)
MCV: 91.3 fL (ref 80.0–100.0)
Platelets: 68 10*3/uL — ABNORMAL LOW (ref 150–400)
RBC: 3.33 MIL/uL — ABNORMAL LOW (ref 3.87–5.11)
RDW: 13.6 % (ref 11.5–15.5)
WBC: 1.5 10*3/uL — ABNORMAL LOW (ref 4.0–10.5)
nRBC: 5.4 % — ABNORMAL HIGH (ref 0.0–0.2)

## 2022-02-12 LAB — COMPREHENSIVE METABOLIC PANEL
ALT: 48 U/L — ABNORMAL HIGH (ref 0–44)
AST: 30 U/L (ref 15–41)
Albumin: 2.6 g/dL — ABNORMAL LOW (ref 3.5–5.0)
Alkaline Phosphatase: 92 U/L (ref 38–126)
Anion gap: 8 (ref 5–15)
BUN: 23 mg/dL (ref 8–23)
CO2: 20 mmol/L — ABNORMAL LOW (ref 22–32)
Calcium: 8.1 mg/dL — ABNORMAL LOW (ref 8.9–10.3)
Chloride: 104 mmol/L (ref 98–111)
Creatinine, Ser: 1.05 mg/dL — ABNORMAL HIGH (ref 0.44–1.00)
GFR, Estimated: 56 mL/min — ABNORMAL LOW (ref >60)
Glucose, Bld: 212 mg/dL — ABNORMAL HIGH (ref 70–99)
Potassium: 4.3 mmol/L (ref 3.5–5.1)
Sodium: 132 mmol/L — ABNORMAL LOW (ref 135–145)
Total Bilirubin: 1.1 mg/dL (ref 0.3–1.2)
Total Protein: 5.7 g/dL — ABNORMAL LOW (ref 6.5–8.1)

## 2022-02-12 LAB — URINE CULTURE

## 2022-02-12 LAB — GLUCOSE, CAPILLARY
Glucose-Capillary: 145 mg/dL — ABNORMAL HIGH (ref 70–99)
Glucose-Capillary: 212 mg/dL — ABNORMAL HIGH (ref 70–99)
Glucose-Capillary: 230 mg/dL — ABNORMAL HIGH (ref 70–99)
Glucose-Capillary: 245 mg/dL — ABNORMAL HIGH (ref 70–99)
Glucose-Capillary: 267 mg/dL — ABNORMAL HIGH (ref 70–99)
Glucose-Capillary: 331 mg/dL — ABNORMAL HIGH (ref 70–99)

## 2022-02-12 LAB — MRSA NEXT GEN BY PCR, NASAL: MRSA by PCR Next Gen: DETECTED — AB

## 2022-02-12 LAB — MAGNESIUM: Magnesium: 1.3 mg/dL — ABNORMAL LOW (ref 1.7–2.4)

## 2022-02-12 MED ORDER — SODIUM CHLORIDE 0.9 % IV SOLN: INTRAVENOUS | Status: AC

## 2022-02-12 MED ORDER — ORAL CARE MOUTH RINSE: 15.0000 mL | OROMUCOSAL | Status: DC | PRN

## 2022-02-12 MED ORDER — VANCOMYCIN HCL 1250 MG/250ML IV SOLN: 1250.0000 mg | INTRAVENOUS | Status: DC

## 2022-02-12 MED ORDER — MUPIROCIN 2 % EX OINT
1.0000 | TOPICAL_OINTMENT | Freq: Two times a day (BID) | CUTANEOUS | Status: DC
  Administered 2022-02-12 – 2022-02-13 (×3): 1 via NASAL
  Filled 2022-02-12: qty 22

## 2022-02-12 MED ORDER — SODIUM CHLORIDE 0.9 % IV SOLN
2.0000 g | Freq: Two times a day (BID) | INTRAVENOUS | Status: DC
  Administered 2022-02-12 – 2022-02-13 (×2): 2 g via INTRAVENOUS
  Filled 2022-02-12 (×2): qty 12.5

## 2022-02-12 MED ORDER — MAGNESIUM SULFATE 2 GM/50ML IV SOLN
2.0000 g | Freq: Once | INTRAVENOUS | Status: AC
  Administered 2022-02-12: 2 g via INTRAVENOUS
  Filled 2022-02-12: qty 50

## 2022-02-12 MED ORDER — CHLORHEXIDINE GLUCONATE CLOTH 2 % EX PADS
6.0000 | MEDICATED_PAD | Freq: Every day | CUTANEOUS | Status: DC
  Administered 2022-02-13: 6 via TOPICAL

## 2022-02-12 NOTE — Plan of Care (Signed)
  Problem: Coping: Goal: Ability to adjust to condition or change in health will improve 02/12/2022 0804 by Tana Conch, RN Outcome: Progressing 02/11/2022 1850 by Tana Conch, RN Outcome: Progressing   Problem: Metabolic: Goal: Ability to maintain appropriate glucose levels will improve 02/12/2022 0804 by Tana Conch, RN Outcome: Progressing 02/11/2022 1850 by Tana Conch, RN Outcome: Progressing   Problem: Nutritional: Goal: Maintenance of adequate nutrition will improve Outcome: Progressing   Problem: Skin Integrity: Goal: Risk for impaired skin integrity will decrease 02/12/2022 0804 by Tana Conch, RN Outcome: Progressing 02/11/2022 1850 by Tana Conch, RN Outcome: Progressing   Problem: Tissue Perfusion: Goal: Adequacy of tissue perfusion will improve Outcome: Progressing   Problem: Education: Goal: Knowledge of General Education information will improve Description: Including pain rating scale, medication(s)/side effects and non-pharmacologic comfort measures Outcome: Progressing   Problem: Health Behavior/Discharge Planning: Goal: Ability to manage health-related needs will improve Outcome: Progressing

## 2022-02-12 NOTE — Progress Notes (Signed)
Pharmacy Antibiotic Note  Clemencia Helzer is a 72 y.o. female admitted on 02/10/2022 with  febrile neutropenia .  Pharmacy has been consulted for vanc dosing to be added to cefepime.  Today, 02/12/2022:  SCr increasing:   0.77 > 0.9 > 1.05 ANC improved 0.0 > 1.0, WBC up to 2.2 (Granix d/c) Tm 102.6  Plan: Decrease to Cefepime 2g IV q12h Decrease to Vancomycin 1250 mg IV q36h  (Scr 1.05, Vd 0.5, est AUC 498) Measure Vanc levels as needed.  Goal AUC = 400 - 550 Follow up renal function, culture results, and clinical course.   Height: '5\' 1"'$  (154.9 cm) Weight: 97.5 kg (215 lb) IBW/kg (Calculated) : 47.8  Temp (24hrs), Avg:100.2 F (37.9 C), Min:98.9 F (37.2 C), Max:102.6 F (39.2 C)  Recent Labs  Lab 02/10/22 2009 02/10/22 2252 02/11/22 0340 02/12/22 0426 02/12/22 0743 02/12/22 1122  WBC 0.9*  --  0.4* 1.5* DUP PER RN JENNIFER 2.2*  CREATININE 0.77  --  0.90 1.05*  --   --   LATICACIDVEN 1.3 1.2  --   --   --   --      Estimated Creatinine Clearance: 51 mL/min (A) (by C-G formula based on SCr of 1.05 mg/dL (H)).    Allergies  Allergen Reactions   Emend [Aprepitant] Other (See Comments)    Pt experienced shooting back pain at 10/10 when medication was administered the first time. See Hypersensitivity note from 01/17/2022.   Vancomycin Other (See Comments)    Patient becomes hypotensive, lethargic and itchy    Fosaprepitant Other (See Comments)    Back pain / hypersensitivity 01/17/2022   Lasix [Furosemide] Other (See Comments)    IV-LASIX ==> Reaction: Burning   Lasix [Furosemide] Other (See Comments)    On MAR    Antimicrobials this admission:  1/6 Cefepime >>  1/6 Vancomycin >>   Dose adjustments this admission:  1/7 renally adjusted  Microbiology results:  1/5 Resp PCR: negative Covid, influenza, RSV 1/6 MRSA PCR: detected 1/5 UCx: mult species 1/5 BCx: ngtd    Thank you for allowing pharmacy to be a part of this patient's care.  Gretta Arab  PharmD, BCPS [facility] main pharmacy [phone] 02/12/2022 12:24 PM

## 2022-02-12 NOTE — Progress Notes (Signed)
Made on call provider aware that patient only had about 150 mL of urinary output so far during this shift. RN bladder scanned patient and patient had 67 mL in bladder. Will continue to monitor and pass on to the next shift.

## 2022-02-12 NOTE — Plan of Care (Signed)

## 2022-02-12 NOTE — Progress Notes (Signed)
   02/12/22 0404  Assess: MEWS Score  Temp (!) 102.5 F (39.2 C)  BP (!) 123/51  MAP (mmHg) 70  Pulse Rate (!) 110  ECG Heart Rate (!) 110  Resp 20  Level of Consciousness Alert  SpO2 97 %  O2 Device Room Air  Assess: MEWS Score  MEWS Temp 2  MEWS Systolic 0  MEWS Pulse 1  MEWS RR 0  MEWS LOC 0  MEWS Score 3  MEWS Score Color Yellow  Assess: if the MEWS score is Yellow or Red  Were vital signs taken at a resting state? Yes  Focused Assessment No change from prior assessment  Does the patient meet 2 or more of the SIRS criteria? Yes  Does the patient have a confirmed or suspected source of infection? Yes  Provider and Rapid Response Notified? Yes  MEWS guidelines implemented *See Row Information* Yes  Treat  MEWS Interventions Administered prn meds/treatments;Escalated (See documentation below)  Complains of Fever  Interventions Medication (see MAR);Other (comment)  Take Vital Signs  Increase Vital Sign Frequency  Yellow: Q 2hr X 2 then Q 4hr X 2, if remains yellow, continue Q 4hrs  Escalate  MEWS: Escalate Yellow: discuss with charge nurse/RN and consider discussing with provider and RRT  Notify: Charge Nurse/RN  Name of Charge Nurse/RN Notified Lorri Frederick., RN  Date Charge Nurse/RN Notified 02/12/22  Time Charge Nurse/RN Notified 6659  Provider Notification  Provider Name/Title Gershon Cull, NP  Date Provider Notified 02/12/22  Time Provider Notified 0408  Method of Notification Page  Notification Reason Change in status;Other (Comment) (patient's elevated temperature and heart rate)  Provider response See new orders  Date of Provider Response 02/12/22  Time of Provider Response 0409  Notify: Rapid Response  Name of Rapid Response RN Notified Lorretta Harp, RN (Rapid Response RN)  Date Rapid Response Notified 02/12/22  Time Rapid Response Notified 0416  Document  Progress note created (see row info) Yes  Assess: SIRS CRITERIA  SIRS Temperature  1  SIRS Pulse 1   SIRS Respirations  0  SIRS WBC 0  SIRS Score Sum  2

## 2022-02-12 NOTE — Progress Notes (Signed)
Notified on call provider that patient was complaining of itching, which RN had just given patient scheduled Atarax. RN was examining her skin and patient has a rash on the left arm. RN was not sure what the patient was having an allergic reaction to. RN did notice that patient does have an allergy to vancomycin, which the patient's last dose was yesterday. The patient told RN that patient noticed it last night. RN consulted with the on call provider as RN was hesitant to give the scheduled vancomycin. On call provider told RN to hold the vancomycin for about an hour and then start it at half the rate.

## 2022-02-12 NOTE — Consult Note (Signed)
WOC Nurse Consult Note: Reason for Consult:right posterior LE wound, pressure Wound type:pressure Pressure Injury POA: Yes Measurement: 1cm x 0.5cm with no depth. Wound is measured by Bedside RN today and documented on Nursing Flow Sheet. Her assistance is appreciated. Wound bed:Not open, N.A Drainage (amount, consistency, odor) None Periwound:mild erythema Dressing procedure/placement/frequency:Patient is being turned and repositioned per house protocol, her heels are to be floated. A sacral foam is to be placed for PI prophylaxis. A pressure redistribution chair cushion is to be provided today and sent with her at time of discharge. Topical care for the right posterior LE lesion will be to cleanse and cover with a silicone foam dressing with daily changes.  [place] nursing team will not follow, but will remain available to this patient, the nursing and medical teams.  Please re-consult if needed.  Thank you for inviting [place] to participate in this patient's Plan of Care.  Maudie Flakes, MSN, RN, CNS, [place], Serita Grammes, [employer], [employer] phone:  [phone]

## 2022-02-12 NOTE — Progress Notes (Addendum)
PROGRESS NOTE    Heather Petty  PYK:[id number] DOB: January 13, 1951 DOA: 02/10/2022 PCP: Pcp, No   Brief Narrative:    Heather Petty is a 72 y.o. female with medical history significant of DM2, schizophrenia, Hodgkin lymphoma currently on chemotherapy with most recent dose 12/26. Pt presents to ED with vomiting, abd pain.  Symptoms onset today.  Periumbilical abd pain and low back pain.  No CP no sore throat.  Having fever and rather severe chills.  He was admitted for febrile neutropenia and started on Granix and is empirically on cefepime with blood cultures NGTD and urine cultures with multiple organisms.  MRSA PCR positive.  CBC with differential now with ANC at 1000. Will plan to DC Granix. Ok to discharge after pt is afebrile for 24 hours.  Assessment & Plan:   Principal Problem:   Febrile neutropenia (HCC) Active Problems:   Type II diabetes mellitus with renal manifestations (HCC)   HTN (hypertension)   Paranoid schizophrenia ([place])   Decubitus ulcer of right leg   Hodgkin lymphoma (HCC)  Assessment and Plan:  Febrile neutropenia ([place]) Pt with fever, chills, and an ANC of 0! Almost certainly secondary to recent and ongoing cytotoxic chemotherapy to treat her Hodgkin Lymphoma, includes vinblastine, doxorubicin, and dacarbazine (got all 3 of these on 12/26). EDP spoke with heme/onc and Dr. Lindi Adie has seen patient 1/6 Pt started on Granix, first dose given in ED, continue daily No obvious source of infection on workup (presumably pt with translocation of GI tract bacteria causing febrile neutropenia today). Empiric cefepime to continue Cultures NGTD x 2 days Tele monitor Repeat labs in AM ANC now above 1000, DC Granix Anticipate DC in 24-48 hours once no longer febrile   Hodgkin lymphoma ([place]) Appears to be responding to chemo based on CT scan today.  Hypomagnesemia Replete IV and reevaluate in a.m.   Decubitus ulcer of right leg Doesn't appear infected on exam  today. Got 1 dose of vanc in addition to cefepime in ED Wound care consult   Paranoid schizophrenia [facility]) Home med rec pending Continue chronic psych meds as long as none of them have neutropenia or agranulocytosis as possible side effects (dont see any of these on her prior med list though).   HTN (hypertension) Holding off on home BP meds for the moment due to risk of progression to sepsis. Use short acting PRN meds if needed.   Type II diabetes mellitus with renal manifestations (HCC) with hyperglycemia Med rec pending Hold PO hypoglycemics Mod scale SSI AC/HS Add Semglee coverage  Abdominal pain with mild transaminitis Continue to follow along LFTs CT abdomen with no acute findings Zofran as needed for nausea or vomiting    DVT prophylaxis: SCDs Code Status: Full Family Communication: None at bedside Disposition Plan:  Status is: Inpatient Remains inpatient appropriate because: Continues to remain on IV medications.   Consultants:  Heme/Onc, Dr. Lorenso Courier to be added to care team  Procedures:  None  Antimicrobials:  Anti-infectives (From admission, onward)    Start     Dose/Rate Route Frequency Ordered Stop   02/11/22 2200  vancomycin (VANCOCIN) IVPB 1000 mg/200 mL premix        1,000 mg 100 mL/hr over 120 Minutes Intravenous Every 24 hours 02/11/22 1024     02/11/22 0400  ceFEPIme (MAXIPIME) 2 g in sodium chloride 0.9 % 100 mL IVPB        2 g 200 mL/hr over 30 Minutes Intravenous Every 8 hours 02/11/22 0149  02/10/22 2100  vancomycin (VANCOCIN) IVPB 1000 mg/200 mL premix        1,000 mg 100 mL/hr over 120 Minutes Intravenous  Once 02/10/22 2059 02/10/22 2334   02/10/22 2030  ceFEPIme (MAXIPIME) 2 g in sodium chloride 0.9 % 100 mL IVPB        2 g 200 mL/hr over 30 Minutes Intravenous  Once 02/10/22 2020 02/10/22 2224   02/10/22 2030  metroNIDAZOLE (FLAGYL) IVPB 500 mg        500 mg 100 mL/hr over 60 Minutes Intravenous  Once 02/10/22 2020 02/10/22 2132        Subjective: Patient seen and evaluated today with no further abdominal pain or nausea/vomiting noted.  Still noted to have fever overnight with Tmax 102.5 Fahrenheit.  Objective: Vitals:   02/11/22 2007 02/12/22 0003 02/12/22 0404 02/12/22 0600  BP: (!) 130/51 (!) 152/73 (!) 123/51 (!) 148/68  Pulse: 87 95 (!) 110 95  Resp: '20 18 20 '$ (!) 22  Temp: 99.5 F (37.5 C) 99 F (37.2 C) (!) 102.5 F (39.2 C) 99.3 F (37.4 C)  TempSrc: Oral Oral Oral Oral  SpO2: 97% 95% 97% 94%  Weight:      Height:        Intake/Output Summary (Last 24 hours) at 02/12/2022 0746 Last data filed at 02/12/2022 0655 Gross per 24 hour  Intake 1914.85 ml  Output 200 ml  Net 1714.85 ml   Filed Weights   02/11/22 0800  Weight: 97.5 kg    Examination:  General exam: Appears calm and comfortable  Respiratory system: Clear to auscultation. Respiratory effort normal. Cardiovascular system: S1 & S2 heard, RRR.  Gastrointestinal system: Abdomen is soft Central nervous system: Alert and awake Extremities: No edema Skin: No significant lesions noted Psychiatry: Flat affect.    Data Reviewed: I have personally reviewed following labs and imaging studies  CBC: Recent Labs  Lab 02/10/22 2009 02/11/22 0340 02/12/22 0426  WBC 0.9* 0.4* 1.5*  NEUTROABS 0.0*  --   --   HGB 11.4* 10.3* 10.0*  HCT 33.9* 30.6* 30.4*  MCV 88.1 88.2 91.3  PLT 89* 68* 68*   Basic Metabolic Panel: Recent Labs  Lab 02/10/22 2009 02/11/22 0340 02/12/22 0426  NA 134* 132* 132*  K 3.5 3.4* 4.3  CL 99 100 104  CO2 24 23 20*  GLUCOSE 333* 340* 212*  BUN '11 12 23  '$ CREATININE 0.77 0.90 1.05*  CALCIUM 9.2 8.4* 8.1*  MG  --   --  1.3*   GFR: Estimated Creatinine Clearance: 51 mL/min (A) (by C-G formula based on SCr of 1.05 mg/dL (H)). Liver Function Tests: Recent Labs  Lab 02/10/22 2009 02/11/22 0340 02/12/22 0426  AST 47* 44* 30  ALT 67* 61* 48*  ALKPHOS 147* 129* 92  BILITOT 1.3* 1.2 1.1  PROT 7.3 6.3* 5.7*   ALBUMIN 3.9 3.1* 2.6*   Recent Labs  Lab 02/10/22 2009  LIPASE 26   No results for input(s): "AMMONIA" in the last 168 hours. Coagulation Profile: Recent Labs  Lab 02/10/22 2009  INR 1.1   Cardiac Enzymes: No results for input(s): "CKTOTAL", "CKMB", "CKMBINDEX", "TROPONINI" in the last 168 hours. BNP (last 3 results) No results for input(s): "PROBNP" in the last 8760 hours. HbA1C: No results for input(s): "HGBA1C" in the last 72 hours. CBG: Recent Labs  Lab 02/11/22 0858 02/11/22 1205 02/11/22 2003 02/11/22 2359 02/12/22 0401  GLUCAP 317* 305* 348* 212* 230*   Lipid Profile: No results for input(s): "  CHOL", "HDL", "LDLCALC", "TRIG", "CHOLHDL", "LDLDIRECT" in the last 72 hours. Thyroid Function Tests: No results for input(s): "TSH", "T4TOTAL", "FREET4", "T3FREE", "THYROIDAB" in the last 72 hours. Anemia Panel: No results for input(s): "VITAMINB12", "FOLATE", "FERRITIN", "TIBC", "IRON", "RETICCTPCT" in the last 72 hours. Sepsis Labs: Recent Labs  Lab 02/10/22 2009 02/10/22 2252  LATICACIDVEN 1.3 1.2    Recent Results (from the past 240 hour(s))  Blood Culture (routine x 2)     Status: None (Preliminary result)   Collection Time: 02/10/22  8:10 PM   Specimen: BLOOD RIGHT FOREARM  Result Value Ref Range Status   Specimen Description   Final    BLOOD RIGHT FOREARM Performed at [facility], [place] [address], [place], [place] [address]    Special Requests   Final    BOTTLES DRAWN AEROBIC AND ANAEROBIC Blood Culture results may not be optimal due to an inadequate volume of blood received in culture bottles Performed at [place] [address] [place], [place] [address]    Culture PENDING  Incomplete   Report Status PENDING  Incomplete  Resp panel by RT-PCR (RSV, Flu A&B, Covid) Anterior Nasal Swab     Status: None   Collection Time: 02/10/22  8:28 PM   Specimen: Anterior Nasal Swab  Result Value Ref Range Status   SARS Coronavirus  2 by RT PCR NEGATIVE NEGATIVE Final    Comment: (NOTE) SARS-CoV-2 target nucleic acids are NOT DETECTED.  The SARS-CoV-2 RNA is generally detectable in upper respiratory specimens during the acute phase of infection. The lowest concentration of SARS-CoV-2 viral copies this assay can detect is 138 copies/mL. A negative result does not preclude SARS-Cov-2 infection and should not be used as the sole basis for treatment or other patient management decisions. A negative result may occur with  improper specimen collection/handling, submission of specimen other than nasopharyngeal swab, presence of viral mutation(s) within the areas targeted by this assay, and inadequate number of viral copies(<138 copies/mL). A negative result must be combined with clinical observations, patient history, and epidemiological information. The expected result is Negative.  Fact Sheet for Patients:  EntrepreneurPulse.com.au  Fact Sheet for Healthcare Providers:  IncredibleEmployment.be  This test is no t yet approved or cleared by the [place] FDA and  has been authorized for detection and/or diagnosis of SARS-CoV-2 by FDA under an Emergency Use Authorization (EUA). This EUA will remain  in effect (meaning this test can be used) for the duration of the COVID-19 declaration under Section 564(b)(1) of the Act, 21 U.S.C.section 360bbb-3(b)(1), unless the authorization is terminated  or revoked sooner.       Influenza A by PCR NEGATIVE NEGATIVE Final   Influenza B by PCR NEGATIVE NEGATIVE Final    Comment: (NOTE) The Xpert Xpress SARS-CoV-2/FLU/RSV plus assay is intended as an aid in the diagnosis of influenza from Nasopharyngeal swab specimens and should not be used as a sole basis for treatment. Nasal washings and aspirates are unacceptable for Xpert Xpress SARS-CoV-2/FLU/RSV testing.  Fact Sheet for Patients: EntrepreneurPulse.com.au  Fact  Sheet for Healthcare Providers: IncredibleEmployment.be  This test is not yet approved or cleared by the [place] FDA and has been authorized for detection and/or diagnosis of SARS-CoV-2 by FDA under an Emergency Use Authorization (EUA). This EUA will remain in effect (meaning this test can be used) for the duration of the COVID-19 declaration under Section 564(b)(1) of the Act, 21 U.S.C. section 360bbb-3(b)(1), unless the authorization is terminated or revoked.  Resp Syncytial Virus by PCR NEGATIVE NEGATIVE Final    Comment: (NOTE) Fact Sheet for Patients: EntrepreneurPulse.com.au  Fact Sheet for Healthcare Providers: IncredibleEmployment.be  This test is not yet approved or cleared by the [place] FDA and has been authorized for detection and/or diagnosis of SARS-CoV-2 by FDA under an Emergency Use Authorization (EUA). This EUA will remain in effect (meaning this test can be used) for the duration of the COVID-19 declaration under Section 564(b)(1) of the Act, 21 U.S.C. section 360bbb-3(b)(1), unless the authorization is terminated or revoked.  Performed at [facility], [place] [address] [place], [place] [address]          Radiology Studies: US Abdomen Limited RUQ (LIVER/GB)  Result Date: 02/10/2022 CLINICAL DATA:  [id number] Abdominal pain [id number] EXAM: ULTRASOUND ABDOMEN LIMITED COMPARISON:  11/06/2019 FINDINGS: The liver demonstrates increased parenchymal echogenicity consistent with fatty infiltration. No focal hepatic parenchymal lesions or intrahepatic ductal dilatation. Layering small shadowing gallstones. No Wall thickening or pericholecystic fluid. CBD measured 0.5cm. IMPRESSION: Hepatic fatty infiltration.  Cholelithiasis. Electronically Signed   By: Sammie Bench M.D.   On: 02/10/2022 22:47   CT ABDOMEN PELVIS W CONTRAST  Result Date: 02/10/2022 CLINICAL DATA:  Hodgkin's lymphoma,  vomiting EXAM: CT ABDOMEN AND PELVIS WITH CONTRAST TECHNIQUE: Multidetector CT imaging of the abdomen and pelvis was performed using the standard protocol following bolus administration of intravenous contrast. RADIATION DOSE REDUCTION: This exam was performed according to the departmental dose-optimization program which includes automated exposure control, adjustment of the mA and/or kV according to patient size and/or use of iterative reconstruction technique. CONTRAST:  100 mL OMNIPAQUE IOHEXOL 300 MG/ML  SOLN COMPARISON:  12/09/2021 and 07/21/2019. FINDINGS: Lower chest: Atheromatous changes of coronary arteries and aorta. No pleural or pericardial effusion. Stable scarring identified lingula with a 1 cm density noted to be stable. Hepatobiliary: No lesions identified in the liver. No intrahepatic ductal dilatation. Gallbladder contains small calcified stones or layering milk of calcium. Pancreas: Fatty atrophy.  No focal lesions identified Spleen: Normal in size without focal abnormality. Adrenals/Urinary Tract: Adrenal glands are unremarkable. Kidneys are normal, without renal calculi, focal lesion, or hydronephrosis. Bladder is unremarkable. Stomach/Bowel: No focal abnormalities are identified in the stomach. No bowel dilatation to suggest obstruction. Normal appendix. Diverticulosis descending and sigmoid colon. Vascular/Lymphatic: Dense atheromatous calcifications of the aorta and its branches. Para-aortic adenopathy identified with the largest lesion now measuring 3.0 x 1.6 cm compared to 3.6 x 2.1 cm previously. Additional retroperitoneal nodes diminished in size and number compared to prior study. Reproductive: Uterus and bilateral adnexa are unremarkable. Other: No abdominal wall hernia or abnormality. No abdominopelvic ascites. Musculoskeletal: Extensive thoracolumbosacral degenerative changes. IMPRESSION: 1. Hypermetabolic retroperitoneal adenopathy less prominent than on prior study. 2.  Diverticulosis. 3. No acute abdominal or pelvic pathology was identified. Electronically Signed   By: Sammie Bench M.D.   On: 02/10/2022 21:56   DG Chest Port 1 View  Result Date: 02/10/2022 CLINICAL DATA:  History of breast cancer and stomach cancer presenting with vomiting. EXAM: PORTABLE CHEST 1 VIEW COMPARISON:  September 04, 2019 FINDINGS: There is stable right-sided venous Port-A-Cath positioning. The heart size and mediastinal contours are within normal limits. There is moderate severity calcification of the aortic arch. There is no evidence of acute infiltrate, pleural effusion or pneumothorax. The visualized skeletal structures are unremarkable. IMPRESSION: No active cardiopulmonary disease. Electronically Signed   By: Virgina Norfolk M.D.   On: 02/10/2022 21:45        Scheduled  Meds:  allopurinol  100 mg Oral Daily   amLODipine  10 mg Oral Daily   carvedilol  6.25 mg Oral BID WC   doxepin  25 mg Oral QHS   famotidine  20 mg Oral BID   feeding supplement  1 Container Oral BID BM   hydrOXYzine  25 mg Oral BID   insulin aspart  0-20 Units Subcutaneous Q4H   insulin glargine-yfgn  25 Units Subcutaneous BID   loratadine  10 mg Oral Daily   polyvinyl alcohol  1 drop Both Eyes BID   Tbo-filgastrim (GRANIX) SQ  480 mcg Subcutaneous q1800   Continuous Infusions:  sodium chloride 50 mL/hr at 02/12/22 0536   ceFEPime (MAXIPIME) IV 2 g (02/12/22 0456)   vancomycin 1,000 mg (02/12/22 0044)     LOS: 1 day    Time spent: 35 minutes    Luci Bellucci Darleen Crocker, DO [facility]  If 7PM-7AM, please contact night-coverage [email or web address] 02/12/2022, 7:46 AM

## 2022-02-13 DIAGNOSIS — D709 Neutropenia, unspecified: Secondary | ICD-10-CM | POA: Diagnosis not present

## 2022-02-13 DIAGNOSIS — R5081 Fever presenting with conditions classified elsewhere: Secondary | ICD-10-CM | POA: Diagnosis not present

## 2022-02-13 DIAGNOSIS — D701 Agranulocytosis secondary to cancer chemotherapy: Secondary | ICD-10-CM | POA: Diagnosis not present

## 2022-02-13 LAB — CBC WITH DIFFERENTIAL/PLATELET
Abs Immature Granulocytes: 0.41 10*3/uL — ABNORMAL HIGH (ref 0.00–0.07)
Basophils Absolute: 0 10*3/uL (ref 0.0–0.1)
Basophils Relative: 0 %
Eosinophils Absolute: 0 10*3/uL (ref 0.0–0.5)
Eosinophils Relative: 0 %
HCT: 28 % — ABNORMAL LOW (ref 36.0–46.0)
Hemoglobin: 9.2 g/dL — ABNORMAL LOW (ref 12.0–15.0)
Immature Granulocytes: 8 %
Lymphocytes Relative: 15 %
Lymphs Abs: 0.8 10*3/uL (ref 0.7–4.0)
MCH: 30 pg (ref 26.0–34.0)
MCHC: 32.9 g/dL (ref 30.0–36.0)
MCV: 91.2 fL (ref 80.0–100.0)
Monocytes Absolute: 0.4 10*3/uL (ref 0.1–1.0)
Monocytes Relative: 9 %
Neutro Abs: 3.5 10*3/uL (ref 1.7–7.7)
Neutrophils Relative %: 68 %
Platelets: 72 10*3/uL — ABNORMAL LOW (ref 150–400)
RBC: 3.07 MIL/uL — ABNORMAL LOW (ref 3.87–5.11)
RDW: 13.9 % (ref 11.5–15.5)
WBC: 5.1 10*3/uL (ref 4.0–10.5)
nRBC: 2 % — ABNORMAL HIGH (ref 0.0–0.2)

## 2022-02-13 LAB — GLUCOSE, CAPILLARY
Glucose-Capillary: 106 mg/dL — ABNORMAL HIGH (ref 70–99)
Glucose-Capillary: 108 mg/dL — ABNORMAL HIGH (ref 70–99)
Glucose-Capillary: 203 mg/dL — ABNORMAL HIGH (ref 70–99)
Glucose-Capillary: 215 mg/dL — ABNORMAL HIGH (ref 70–99)
Glucose-Capillary: 232 mg/dL — ABNORMAL HIGH (ref 70–99)
Glucose-Capillary: 262 mg/dL — ABNORMAL HIGH (ref 70–99)

## 2022-02-13 LAB — BASIC METABOLIC PANEL
Anion gap: 7 (ref 5–15)
BUN: 19 mg/dL (ref 8–23)
CO2: 21 mmol/L — ABNORMAL LOW (ref 22–32)
Calcium: 8.2 mg/dL — ABNORMAL LOW (ref 8.9–10.3)
Chloride: 105 mmol/L (ref 98–111)
Creatinine, Ser: 1.08 mg/dL — ABNORMAL HIGH (ref 0.44–1.00)
GFR, Estimated: 54 mL/min — ABNORMAL LOW (ref >60)
Glucose, Bld: 104 mg/dL — ABNORMAL HIGH (ref 70–99)
Potassium: 3.8 mmol/L (ref 3.5–5.1)
Sodium: 133 mmol/L — ABNORMAL LOW (ref 135–145)

## 2022-02-13 LAB — MAGNESIUM: Magnesium: 2.1 mg/dL (ref 1.7–2.4)

## 2022-02-13 LAB — HEMOGLOBIN A1C
Hgb A1c MFr Bld: 10 % — ABNORMAL HIGH (ref 4.8–5.6)
Mean Plasma Glucose: 240 mg/dL

## 2022-02-13 MED ORDER — AMOXICILLIN-POT CLAVULANATE 875-125 MG PO TABS: 1.0000 | ORAL_TABLET | Freq: Two times a day (BID) | ORAL | 0 refills | Status: AC

## 2022-02-13 MED ORDER — AMOXICILLIN-POT CLAVULANATE 875-125 MG PO TABS
1.0000 | ORAL_TABLET | Freq: Two times a day (BID) | ORAL | Status: DC
  Administered 2022-02-13: 1 via ORAL
  Filled 2022-02-13: qty 1

## 2022-02-13 NOTE — Plan of Care (Signed)

## 2022-02-13 NOTE — TOC Transition Note (Addendum)
Transition of Care [facility]) - CM/SW Discharge Note   Patient Details  Name: Heather Petty MRN: [id number] Date of Birth: October 09, 1950  [facility] CM/SW Contact:  Illene Regulus, LCSW Phone Number: 02/13/2022, 3:36 PM   Clinical Narrative:     Pt to d/c back to [facility], call report [phone],  PTAR called. TOC sign off.  Final next level of care: Long Term Nursing Home Barriers to Discharge: No Barriers Identified   Patient Goals and CMS Choice      Discharge Placement                         Discharge Plan and Services Additional resources added to the After Visit Summary for                                       Social Determinants of Health (SDOH) Interventions SDOH Screenings   Food Insecurity: No Food Insecurity (02/11/2022)  Housing: Low Risk  (02/11/2022)  Transportation Needs: No Transportation Needs (02/11/2022)  Utilities: Not At Risk (02/11/2022)  Tobacco Use: Low Risk  (02/10/2022)     Readmission Risk Interventions     No data to display

## 2022-02-13 NOTE — Discharge Summary (Signed)
Physician Discharge Summary  Heather Petty VVO:[id number] DOB: 1950-03-16 DOA: 02/10/2022  PCP: Pcp, No  Admit date: 02/10/2022 Discharge date: 02/13/2022  Admitted From: [facility] Disposition:  SNF  Discharge Condition:Stable CODE STATUS:FULL Diet recommendation: Carb Modified   Brief/Interim Summary: Heather Petty is a 72 y.o. female with medical history significant of DM2, schizophrenia, Hodgkin lymphoma currently on chemotherapy with most recent dose 12/26. Pt presents to ED with vomiting, abd pain.  Symptoms onset today.  Periumbilical abd pain and low back pain.  No CP no sore throat.  Having fever and rather severe chills.  He was admitted for febrile neutropenia and started on Granix and was empirically on cefepime with blood cultures NGTD and urine cultures with multiple organisms.  There is significant improvement in the white cell count.  Granix discontinued.  She is medically stable for discharge back to skilled nursing facility with oral antibiotics.  Discussed the plan with her oncologist, Dr. Lorenso Courier.  Following problems were addressed during her hospitalization:  Febrile neutropenia  Further details as above Febrile today.  White cell count significantly improved.  Continue oral antibiotics for 5 more days.  Check CBC in a week   Hodgkin lymphoma Appears to be responding to chemo based on CT scan .  Follow-up with oncology as an outpatient   Decubitus ulcer of right leg Doesn't appear infected on exam today. Wound care consulted   Paranoid schizophrenia  Continue home medications   HTN Continue home medications.   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  Debility/deconditioning/weakness: Lives at nursing facility, not ambulatory at baseline.  Morbid obesity: BMI 40.6  Discharge  Diagnoses:  Principal Problem:   Febrile neutropenia ([place]) Active Problems:   Type II diabetes mellitus with renal manifestations (HCC)   HTN (hypertension)   Paranoid schizophrenia ([place])   Decubitus ulcer of right leg   Hodgkin lymphoma [facility])    Discharge Instructions  Discharge Instructions     Diet - low sodium heart healthy   Complete by: As directed    Discharge instructions   Complete by: As directed    1)please take prescribed medications as instructed 2)Do a CBC test in a week to check your white cell count 3)Follow up with your oncologist.   Discharge wound care:   Complete by: As directed    As per wound care nurse   Increase activity slowly   Complete by: As directed       Allergies as of 02/13/2022       Reactions   Emend [aprepitant] Other (See Comments)   Pt experienced shooting back pain at 10/10 when medication was administered the first time. See Hypersensitivity note from 01/17/2022.   Vancomycin Other (See Comments)   Patient becomes hypotensive, lethargic and itchy    Fosaprepitant Other (See Comments)   Back pain / hypersensitivity 01/17/2022   Lasix [furosemide] Other (See Comments)   IV-LASIX ==> Reaction: Burning   Lasix [furosemide] Other (See Comments)   On MAR        Medication List     TAKE these medications    acetaminophen 500 MG tablet Commonly known as: TYLENOL Take 1,000 mg by mouth 3 (three) times daily as needed for moderate pain.   albuterol 108 (90 Base) MCG/ACT inhaler Commonly known as: VENTOLIN HFA Inhale 2 puffs into the lungs every 4 (four) hours  as needed for wheezing or shortness of breath.   allopurinol 100 MG tablet Commonly known as: ZYLOPRIM Take 100 mg by mouth daily.   amLODipine 10 MG tablet Commonly known as: NORVASC Take 10 mg by mouth daily.   amoxicillin-clavulanate 875-125 MG tablet Commonly known as: AUGMENTIN Take 1 tablet by mouth every 12 (twelve) hours for 5 days.   carvedilol 6.25 MG  tablet Commonly known as: COREG Take 6.25 mg by mouth 2 (two) times daily with a meal.   cholecalciferol 25 MCG (1000 UNIT) tablet Commonly known as: VITAMIN D3 Take 2,000 Units by mouth daily.   doxepin 25 MG capsule Commonly known as: SINEQUAN Take 25 mg by mouth at bedtime.   famotidine 20 MG tablet Commonly known as: PEPCID Take 20 mg by mouth 2 (two) times daily.   Gas-X Ultra Strength 180 MG Caps Generic drug: Simethicone Take 1 capsule by mouth every 8 (eight) hours as needed (gas).   glipiZIDE 10 MG 24 hr tablet Commonly known as: GLUCOTROL XL Take 10 mg by mouth 2 (two) times daily.   guaiFENesin 600 MG 12 hr tablet Commonly known as: MUCINEX Take 600 mg by mouth 2 (two) times daily as needed (congestion).   hydrOXYzine 25 MG tablet Commonly known as: ATARAX Take 25 mg by mouth in the morning and at bedtime.   insulin aspart 100 UNIT/ML injection Commonly known as: NovoLOG Inject 0-12 Units into the skin See admin instructions. Inject 0-12 units into the skin 4 times a day (before meals and at bedtime) per sliding scale: BGL <60 or >400  = CALL MD; 60-200 = 0 units; 201-250 = 2 units; 251-300 = 4 units; 301-350 = 6 units; 351-400 = 8 units; 401-450 = 10 units; >450 = 12 units   lactose free nutrition Liqd Take 237 mLs by mouth 2 (two) times daily between meals.   Lantus SoloStar 100 UNIT/ML Solostar Pen Generic drug: insulin glargine Inject 26-38 Units into the skin 2 (two) times daily. Inject 38 units in the morning and Inject 26 units at bedtime What changed: Another medication with the same name was removed. Continue taking this medication, and follow the directions you see here.   lidocaine 4 % Place 1 patch onto the skin daily.   lidocaine-prilocaine cream Commonly known as: EMLA Apply 1 Application topically as needed. What changed: reasons to take this   lisinopril 5 MG tablet Commonly known as: ZESTRIL Take 5 mg by mouth daily.   loperamide 2  MG capsule Commonly known as: IMODIUM Take 1 capsule (2 mg total) by mouth as needed for diarrhea or loose stools.   loratadine 10 MG tablet Commonly known as: CLARITIN Take 10 mg by mouth daily.   polyethylene glycol 17 g packet Commonly known as: MIRALAX / GLYCOLAX Take 17 g by mouth daily as needed for mild constipation.   prochlorperazine 10 MG tablet Commonly known as: COMPAZINE Take 1 tablet (10 mg total) by mouth every 6 (six) hours as needed for nausea or vomiting.   PROSTAT PO Take 30 mLs by mouth in the morning and at bedtime.   Systane Ultra 0.4-0.3 % Soln Generic drug: Polyethyl Glycol-Propyl Glycol Place 2 drops into both eyes in the morning and at bedtime.               Discharge Care Instructions  (From admission, onward)           Start     Ordered   02/13/22 0000  Discharge  wound care:       Comments: As per wound care nurse   02/13/22 1455            Allergies  Allergen Reactions   Emend [Aprepitant] Other (See Comments)    Pt experienced shooting back pain at 10/10 when medication was administered the first time. See Hypersensitivity note from 01/17/2022.   Vancomycin Other (See Comments)    Patient becomes hypotensive, lethargic and itchy    Fosaprepitant Other (See Comments)    Back pain / hypersensitivity 01/17/2022   Lasix [Furosemide] Other (See Comments)    IV-LASIX ==> Reaction: Burning   Lasix [Furosemide] Other (See Comments)    On MAR    Consultations: None   Procedures/Studies: US Abdomen Limited RUQ (LIVER/GB)  Result Date: 02/10/2022 CLINICAL DATA:  [id number] Abdominal pain [id number] EXAM: ULTRASOUND ABDOMEN LIMITED COMPARISON:  11/06/2019 FINDINGS: The liver demonstrates increased parenchymal echogenicity consistent with fatty infiltration. No focal hepatic parenchymal lesions or intrahepatic ductal dilatation. Layering small shadowing gallstones. No Wall thickening or pericholecystic fluid. CBD measured 0.5cm.  IMPRESSION: Hepatic fatty infiltration.  Cholelithiasis. Electronically Signed   By: Sammie Bench M.D.   On: 02/10/2022 22:47   CT ABDOMEN PELVIS W CONTRAST  Result Date: 02/10/2022 CLINICAL DATA:  Hodgkin's lymphoma, vomiting EXAM: CT ABDOMEN AND PELVIS WITH CONTRAST TECHNIQUE: Multidetector CT imaging of the abdomen and pelvis was performed using the standard protocol following bolus administration of intravenous contrast. RADIATION DOSE REDUCTION: This exam was performed according to the departmental dose-optimization program which includes automated exposure control, adjustment of the mA and/or kV according to patient size and/or use of iterative reconstruction technique. CONTRAST:  100 mL OMNIPAQUE IOHEXOL 300 MG/ML  SOLN COMPARISON:  12/09/2021 and 07/21/2019. FINDINGS: Lower chest: Atheromatous changes of coronary arteries and aorta. No pleural or pericardial effusion. Stable scarring identified lingula with a 1 cm density noted to be stable. Hepatobiliary: No lesions identified in the liver. No intrahepatic ductal dilatation. Gallbladder contains small calcified stones or layering milk of calcium. Pancreas: Fatty atrophy.  No focal lesions identified Spleen: Normal in size without focal abnormality. Adrenals/Urinary Tract: Adrenal glands are unremarkable. Kidneys are normal, without renal calculi, focal lesion, or hydronephrosis. Bladder is unremarkable. Stomach/Bowel: No focal abnormalities are identified in the stomach. No bowel dilatation to suggest obstruction. Normal appendix. Diverticulosis descending and sigmoid colon. Vascular/Lymphatic: Dense atheromatous calcifications of the aorta and its branches. Para-aortic adenopathy identified with the largest lesion now measuring 3.0 x 1.6 cm compared to 3.6 x 2.1 cm previously. Additional retroperitoneal nodes diminished in size and number compared to prior study. Reproductive: Uterus and bilateral adnexa are unremarkable. Other: No abdominal wall  hernia or abnormality. No abdominopelvic ascites. Musculoskeletal: Extensive thoracolumbosacral degenerative changes. IMPRESSION: 1. Hypermetabolic retroperitoneal adenopathy less prominent than on prior study. 2. Diverticulosis. 3. No acute abdominal or pelvic pathology was identified. Electronically Signed   By: Sammie Bench M.D.   On: 02/10/2022 21:56   DG Chest Port 1 View  Result Date: 02/10/2022 CLINICAL DATA:  History of breast cancer and stomach cancer presenting with vomiting. EXAM: PORTABLE CHEST 1 VIEW COMPARISON:  September 04, 2019 FINDINGS: There is stable right-sided venous Port-A-Cath positioning. The heart size and mediastinal contours are within normal limits. There is moderate severity calcification of the aortic arch. There is no evidence of acute infiltrate, pleural effusion or pneumothorax. The visualized skeletal structures are unremarkable. IMPRESSION: No active cardiopulmonary disease. Electronically Signed   By: Virgina Norfolk M.D.   On: 02/10/2022 21:45  Subjective: Patient seen and examined at the bedside today.  Afebrile this morning.  Hemodynamically stable.  Looks overall comfortable.  Denies any nausea or vomiting or abdominal pain.  Complains of weakness.  On room air. I called her daughter and  had a long discussion about the discharge plan today.  Discharge Exam: Vitals:   02/13/22 0919 02/13/22 1404  BP: 135/65 (!) 115/54  Pulse:  93  Resp:  20  Temp:  99.2 F (37.3 C)  SpO2:  94%   Vitals:   02/12/22 2040 02/13/22 0411 02/13/22 0919 02/13/22 1404  BP: (!) 149/68 137/67 135/65 (!) 115/54  Pulse: 94 95  93  Resp: '20 20  20  '$ Temp: 98.5 F (36.9 C) 98.9 F (37.2 C)  99.2 F (37.3 C)  TempSrc: Oral Oral  Oral  SpO2: 95% 99%  94%  Weight:      Height:        General: Pt is alert, awake, not in acute distress, Morbidly obese  cardiovascular: RRR, S1/S2 +, no rubs, no gallops Respiratory: CTA bilaterally, no wheezing, no rhonchi Abdominal:  Soft, NT, ND, bowel sounds + Extremities: no edema, no cyanosis    The results of significant diagnostics from this hospitalization (including imaging, microbiology, ancillary and laboratory) are listed below for reference.     Microbiology: Recent Results (from the past 240 hour(s))  Blood Culture (routine x 2)     Status: None (Preliminary result)   Collection Time: 02/10/22  8:09 PM   Specimen: BLOOD  Result Value Ref Range Status   Specimen Description   Final    BLOOD RIGHT ANTECUBITAL Performed at [facility] [address], [place], [place] [address]    Special Requests   Final    BOTTLES DRAWN AEROBIC AND ANAEROBIC Blood Culture adequate volume Performed at [facility] [address] [place], [place] [address]    Culture   Final    NO GROWTH 3 DAYS Performed at [facility], [place] [address] [place], [place] [address]    Report Status PENDING  Incomplete  Blood Culture (routine x 2)     Status: None (Preliminary result)   Collection Time: 02/10/22  8:10 PM   Specimen: BLOOD RIGHT FOREARM  Result Value Ref Range Status   Specimen Description   Final    BLOOD RIGHT FOREARM Performed at [facility], [place] [address] [place], [place] [address]    Special Requests   Final    BOTTLES DRAWN AEROBIC AND ANAEROBIC Blood Culture results may not be optimal due to an inadequate volume of blood received in culture bottles Performed at [facility] [address] [place],  [address]    Culture   Final    NO GROWTH 3 DAYS Performed at [facility], [place] [address], [place],  [address]    Report Status PENDING  Incomplete  Resp panel by RT-PCR (RSV, Flu A&B, Covid) Anterior Nasal Swab     Status: None   Collection Time: 02/10/22  8:28 PM   Specimen: Anterior Nasal Swab  Result Value Ref Range Status   SARS Coronavirus 2 by RT PCR NEGATIVE NEGATIVE Final    Comment: (NOTE) SARS-CoV-2  target nucleic acids are NOT DETECTED.  The SARS-CoV-2 RNA is generally detectable in upper respiratory specimens during the acute phase of infection. The lowest concentration of SARS-CoV-2 viral copies this assay can detect is 138 copies/mL. A negative result does  not preclude SARS-Cov-2 infection and should not be used as the sole basis for treatment or other patient management decisions. A negative result may occur with  improper specimen collection/handling, submission of specimen other than nasopharyngeal swab, presence of viral mutation(s) within the areas targeted by this assay, and inadequate number of viral copies(<138 copies/mL). A negative result must be combined with clinical observations, patient history, and epidemiological information. The expected result is Negative.  Fact Sheet for Patients:  EntrepreneurPulse.com.au  Fact Sheet for Healthcare Providers:  IncredibleEmployment.be  This test is no t yet approved or cleared by the [place] FDA and  has been authorized for detection and/or diagnosis of SARS-CoV-2 by FDA under an Emergency Use Authorization (EUA). This EUA will remain  in effect (meaning this test can be used) for the duration of the COVID-19 declaration under Section 564(b)(1) of the Act, 21 U.S.C.section 360bbb-3(b)(1), unless the authorization is terminated  or revoked sooner.       Influenza A by PCR NEGATIVE NEGATIVE Final   Influenza B by PCR NEGATIVE NEGATIVE Final    Comment: (NOTE) The Xpert Xpress SARS-CoV-2/FLU/RSV plus assay is intended as an aid in the diagnosis of influenza from Nasopharyngeal swab specimens and should not be used as a sole basis for treatment. Nasal washings and aspirates are unacceptable for Xpert Xpress SARS-CoV-2/FLU/RSV testing.  Fact Sheet for Patients: EntrepreneurPulse.com.au  Fact Sheet for Healthcare  Providers: IncredibleEmployment.be  This test is not yet approved or cleared by the [place] FDA and has been authorized for detection and/or diagnosis of SARS-CoV-2 by FDA under an Emergency Use Authorization (EUA). This EUA will remain in effect (meaning this test can be used) for the duration of the COVID-19 declaration under Section 564(b)(1) of the Act, 21 U.S.C. section 360bbb-3(b)(1), unless the authorization is terminated or revoked.     Resp Syncytial Virus by PCR NEGATIVE NEGATIVE Final    Comment: (NOTE) Fact Sheet for Patients: EntrepreneurPulse.com.au  Fact Sheet for Healthcare Providers: IncredibleEmployment.be  This test is not yet approved or cleared by the [place] FDA and has been authorized for detection and/or diagnosis of SARS-CoV-2 by FDA under an Emergency Use Authorization (EUA). This EUA will remain in effect (meaning this test can be used) for the duration of the COVID-19 declaration under Section 564(b)(1) of the Act, 21 U.S.C. section 360bbb-3(b)(1), unless the authorization is terminated or revoked.  Performed at [facility], [place] [address] [place], [place] [address]   Urine Culture     Status: Abnormal   Collection Time: 02/10/22  9:37 PM   Specimen: In/Out Cath Urine  Result Value Ref Range Status   Specimen Description   Final    IN/OUT CATH URINE Performed at [facility] [address], [place], [place] [address]    Special Requests   Final    NONE Performed at [facility], [place] [address], [place],  [address]    Culture MULTIPLE SPECIES PRESENT, SUGGEST RECOLLECTION (A)  Final   Report Status 02/12/2022 FINAL  Final  MRSA Next Gen by PCR, Nasal     Status: Abnormal   Collection Time: 02/11/22  5:48 AM   Specimen: Nasal Mucosa; Nasal Swab  Result Value Ref Range Status   MRSA by PCR Next Gen DETECTED (A)  NOT DETECTED Final    Comment: CRITICAL RESULT CALLED TO, READ BACK BY AND VERIFIED WITH: Cloretta Ned RN @ [phone] 02/12/22. GILBERT, L (NOTE) The GeneXpert MRSA Assay (FDA approved  for NASAL specimens only), is one component of a comprehensive MRSA colonization surveillance program. It is not intended to diagnose MRSA infection nor to guide or monitor treatment for MRSA infections. Test performance is not FDA approved in patients less than 12 years old. Performed at [facility], [place] [address], [place], [place] [address]      Labs: BNP (last 3 results) No results for input(s): "BNP" in the last 8760 hours. Basic Metabolic Panel: Recent Labs  Lab 02/10/22 2009 02/11/22 0340 02/12/22 0426 02/13/22 0456  NA 134* 132* 132* 133*  K 3.5 3.4* 4.3 3.8  CL 99 100 104 105  CO2 24 23 20* 21*  GLUCOSE 333* 340* 212* 104*  BUN '11 12 23 19  '$ CREATININE 0.77 0.90 1.05* 1.08*  CALCIUM 9.2 8.4* 8.1* 8.2*  MG  --   --  1.3* 2.1   Liver Function Tests: Recent Labs  Lab 02/10/22 2009 02/11/22 0340 02/12/22 0426  AST 47* 44* 30  ALT 67* 61* 48*  ALKPHOS 147* 129* 92  BILITOT 1.3* 1.2 1.1  PROT 7.3 6.3* 5.7*  ALBUMIN 3.9 3.1* 2.6*   Recent Labs  Lab 02/10/22 2009  LIPASE 26   No results for input(s): "AMMONIA" in the last 168 hours. CBC: Recent Labs  Lab 02/10/22 2009 02/11/22 0340 02/12/22 0426 02/12/22 0743 02/12/22 1122 02/13/22 0456  WBC 0.9* 0.4* 1.5* DUP PER RN JENNIFER 2.2* 5.1  NEUTROABS 0.0*  --   --  PENDING 1.0* 3.5  HGB 11.4* 10.3* 10.0* DUP PER RN JENNIFER 10.2* 9.2*  HCT 33.9* 30.6* 30.4* DUP PER RN JENNIFER 31.6* 28.0*  MCV 88.1 88.2 91.3 DUP PER RN JENNIFER 92.1 91.2  PLT 89* 68* 68* DUP PER RN JENNIFER 64* 72*   Cardiac Enzymes: No results for input(s): "CKTOTAL", "CKMB", "CKMBINDEX", "TROPONINI" in the last 168 hours. BNP: Invalid input(s): "POCBNP" CBG: Recent Labs  Lab 02/12/22 2034 02/13/22 0023 02/13/22 0357 02/13/22 0738  02/13/22 1145  GLUCAP 267* 262* 106* 108* 232*   D-Dimer No results for input(s): "DDIMER" in the last 72 hours. Hgb A1c Recent Labs    02/10/22 2009  HGBA1C 10.0*   Lipid Profile No results for input(s): "CHOL", "HDL", "LDLCALC", "TRIG", "CHOLHDL", "LDLDIRECT" in the last 72 hours. Thyroid function studies No results for input(s): "TSH", "T4TOTAL", "T3FREE", "THYROIDAB" in the last 72 hours.  Invalid input(s): "FREET3" Anemia work up No results for input(s): "VITAMINB12", "FOLATE", "FERRITIN", "TIBC", "IRON", "RETICCTPCT" in the last 72 hours. Urinalysis    Component Value Date/Time   COLORURINE YELLOW 02/10/2022 2137   APPEARANCEUR CLEAR 02/10/2022 2137   LABSPEC 1.042 (H) 02/10/2022 2137   PHURINE 7.0 02/10/2022 2137   GLUCOSEU >=500 (A) 02/10/2022 2137   HGBUR NEGATIVE 02/10/2022 2137   BILIRUBINUR NEGATIVE 02/10/2022 2137   KETONESUR 20 (A) 02/10/2022 2137   PROTEINUR 30 (A) 02/10/2022 2137   UROBILINOGEN 0.2 12/27/2010 1858   NITRITE NEGATIVE 02/10/2022 2137   LEUKOCYTESUR NEGATIVE 02/10/2022 2137   Sepsis Labs Recent Labs  Lab 02/12/22 0426 02/12/22 0743 02/12/22 1122 02/13/22 0456  WBC 1.5* DUP PER RN JENNIFER 2.2* 5.1   Microbiology Recent Results (from the past 240 hour(s))  Blood Culture (routine x 2)     Status: None (Preliminary result)   Collection Time: 02/10/22  8:09 PM   Specimen: BLOOD  Result Value Ref Range Status   Specimen Description   Final    BLOOD RIGHT ANTECUBITAL Performed at [facility], [place]  [address] [place], [place] [address]    Special Requests   Final    BOTTLES DRAWN AEROBIC AND ANAEROBIC Blood Culture adequate volume Performed at [facility] [address], [place], [place] [address]    Culture   Final    NO GROWTH 3 DAYS Performed at [facility], [place] [address] [place], [place] [address]    Report Status PENDING  Incomplete  Blood Culture (routine x 2)     Status:  None (Preliminary result)   Collection Time: 02/10/22  8:10 PM   Specimen: BLOOD RIGHT FOREARM  Result Value Ref Range Status   Specimen Description   Final    BLOOD RIGHT FOREARM Performed at [facility], [place] [address], [place], [place] [address]    Special Requests   Final    BOTTLES DRAWN AEROBIC AND ANAEROBIC Blood Culture results may not be optimal due to an inadequate volume of blood received in culture bottles Performed at [facility] [address], [place], [place] [address]    Culture   Final    NO GROWTH 3 DAYS Performed at [facility], [place] [address] [place], [place] [address]    Report Status PENDING  Incomplete  Resp panel by RT-PCR (RSV, Flu A&B, Covid) Anterior Nasal Swab     Status: None   Collection Time: 02/10/22  8:28 PM   Specimen: Anterior Nasal Swab  Result Value Ref Range Status   SARS Coronavirus 2 by RT PCR NEGATIVE NEGATIVE Final    Comment: (NOTE) SARS-CoV-2 target nucleic acids are NOT DETECTED.  The SARS-CoV-2 RNA is generally detectable in upper respiratory specimens during the acute phase of infection. The lowest concentration of SARS-CoV-2 viral copies this assay can detect is 138 copies/mL. A negative result does not preclude SARS-Cov-2 infection and should not be used as the sole basis for treatment or other patient management decisions. A negative result may occur with  improper specimen collection/handling, submission of specimen other than nasopharyngeal swab, presence of viral mutation(s) within the areas targeted by this assay, and inadequate number of viral copies(<138 copies/mL). A negative result must be combined with clinical observations, patient history, and epidemiological information. The expected result is Negative.  Fact Sheet for Patients:  EntrepreneurPulse.com.au  Fact Sheet for Healthcare Providers:  IncredibleEmployment.be  This test is no t yet  approved or cleared by the [place] FDA and  has been authorized for detection and/or diagnosis of SARS-CoV-2 by FDA under an Emergency Use Authorization (EUA). This EUA will remain  in effect (meaning this test can be used) for the duration of the COVID-19 declaration under Section 564(b)(1) of the Act, 21 U.S.C.section 360bbb-3(b)(1), unless the authorization is terminated  or revoked sooner.       Influenza A by PCR NEGATIVE NEGATIVE Final   Influenza B by PCR NEGATIVE NEGATIVE Final    Comment: (NOTE) The Xpert Xpress SARS-CoV-2/FLU/RSV plus assay is intended as an aid in the diagnosis of influenza from Nasopharyngeal swab specimens and should not be used as a sole basis for treatment. Nasal washings and aspirates are unacceptable for Xpert Xpress SARS-CoV-2/FLU/RSV testing.  Fact Sheet for Patients: EntrepreneurPulse.com.au  Fact Sheet for Healthcare Providers: IncredibleEmployment.be  This test is not yet approved or cleared by the [place] FDA and has been authorized for detection and/or diagnosis of SARS-CoV-2 by FDA under an Emergency Use Authorization (EUA). This EUA will remain in effect (meaning this test can be used) for  the duration of the COVID-19 declaration under Section 564(b)(1) of the Act, 21 U.S.C. section 360bbb-3(b)(1), unless the authorization is terminated or revoked.     Resp Syncytial Virus by PCR NEGATIVE NEGATIVE Final    Comment: (NOTE) Fact Sheet for Patients: EntrepreneurPulse.com.au  Fact Sheet for Healthcare Providers: IncredibleEmployment.be  This test is not yet approved or cleared by the [place] FDA and has been authorized for detection and/or diagnosis of SARS-CoV-2 by FDA under an Emergency Use Authorization (EUA). This EUA will remain in effect (meaning this test can be used) for the duration of the COVID-19 declaration under Section 564(b)(1) of  the Act, 21 U.S.C. section 360bbb-3(b)(1), unless the authorization is terminated or revoked.  Performed at [facility], [place] [address] [place], [place] [address]   Urine Culture     Status: Abnormal   Collection Time: 02/10/22  9:37 PM   Specimen: In/Out Cath Urine  Result Value Ref Range Status   Specimen Description   Final    IN/OUT CATH URINE Performed at [facility] [address], [place], [place] [address]    Special Requests   Final    NONE Performed at [facility], [place] [address] [place], [place] [address]    Culture MULTIPLE SPECIES PRESENT, SUGGEST RECOLLECTION (A)  Final   Report Status 02/12/2022 FINAL  Final  MRSA Next Gen by PCR, Nasal     Status: Abnormal   Collection Time: 02/11/22  5:48 AM   Specimen: Nasal Mucosa; Nasal Swab  Result Value Ref Range Status   MRSA by PCR Next Gen DETECTED (A) NOT DETECTED Final    Comment: CRITICAL RESULT CALLED TO, READ BACK BY AND VERIFIED WITH: Cloretta Ned RN @ [phone] 02/12/22. GILBERT, L (NOTE) The GeneXpert MRSA Assay (FDA approved for NASAL specimens only), is one component of a comprehensive MRSA colonization surveillance program. It is not intended to diagnose MRSA infection nor to guide or monitor treatment for MRSA infections. Test performance is not FDA approved in patients less than 28 years old. Performed at [facility], [place] [address], [place], [place] [address]     Please note: You were cared for by a hospitalist during your hospital stay. Once you are discharged, your primary care physician will handle any further medical issues. Please note that NO REFILLS for any discharge medications will be authorized once you are discharged, as it is imperative that you return to your primary care physician (or establish a relationship with a primary care physician if you do not have one) for your post hospital discharge needs so that they can  reassess your need for medications and monitor your lab values.    Time coordinating discharge: 40 minutes  SIGNED:   Shelly Coss, MD  [facility] 02/13/2022, 2:56 PM Pager [phone]  If 7PM-7AM, please contact night-coverage [email or web address] Password [email or web address]

## 2022-02-13 NOTE — TOC Initial Note (Signed)
Transition of Care [facility]) - Initial/Assessment Note    Patient Details  Name: Heather Petty MRN: [id number] Date of Birth: Apr 22, 1950  Transition of Care [facility]) CM/SW Contact:    Illene Regulus, LCSW Phone Number: 02/13/2022, 11:18 AM  Clinical Narrative:                  CSW spoke with Selinda Eon with [facility] to confirm pt is a LTC resident, and can return when medically stable. No FL2 needed.TOC will follow for d/c needs.       Patient Goals and CMS Choice            Expected Discharge Plan and Services                                              Prior Living Arrangements/Services                       Activities of Daily Living Home Assistive Devices/Equipment: Other (Comment) (from SNF) ADL Screening (condition at time of admission) Patient's cognitive ability adequate to safely complete daily activities?: Yes Is the patient deaf or have difficulty hearing?: No Does the patient have difficulty seeing, even when wearing glasses/contacts?: Yes Does the patient have difficulty concentrating, remembering, or making decisions?: Yes Patient able to express need for assistance with ADLs?: Yes Does the patient have difficulty dressing or bathing?: Yes Independently performs ADLs?: No Does the patient have difficulty walking or climbing stairs?: Yes Weakness of Legs: Both Weakness of Arms/Hands: Both  Permission Sought/Granted                  Emotional Assessment              Admission diagnosis:  Febrile neutropenia ([place]) [D70.9, R50.81] Neutropenic fever ([place]) [D70.9, R50.81] Patient Active Problem List   Diagnosis Date Noted   Febrile neutropenia ([place]) 02/11/2022   Hodgkin lymphoma ([place]) 01/10/2022   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   Candidal intertrigo 09/25/2019   CAP (community acquired pneumonia) 09/03/2019   Uncontrolled type 2 diabetes mellitus with hyperglycemia ([place]) 09/03/2019   Elevated LFTs  09/03/2019   Essential hypertension 09/03/2019   Gout 09/03/2019   Asthma 09/03/2019   Anemia 09/03/2019   Pneumonia 09/03/2019   Splenomegaly    Anemia 08/05/2019   Diarrhea 08/05/2019   DKA (diabetic ketoacidoses) 07/22/2019   Rhabdomyolysis 38/25/0539   Acute metabolic encephalopathy 76/73/4193   MRSA bacteremia 07/22/2019   Enterococcal bacteremia 07/22/2019   Vitiligo 07/22/2019   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   Sepsis ([place]) 07/19/2018   Ventricular tachycardia, sustained ([place]) 07/19/2018   NSTEMI (non-ST elevated myocardial infarction) ([place]) 07/19/2018   Wound infection 04/03/2018   Morbid obesity with BMI of 50.0-59.9, adult ([place]) 04/03/2018   Elevated alkaline phosphatase level 04/03/2018   Asthma exacerbation 03/14/2018   Hypotension 03/14/2018   Lactic acidosis 03/14/2018   GERD (gastroesophageal reflux disease) 03/14/2018   Acute renal failure superimposed on stage 3 chronic kidney disease ([place]) 03/14/2018   Macrocytic anemia 03/14/2018   Abnormal LFTs 03/14/2018   Acute kidney injury ([place]) 02/28/2018   Wound of sacral region 02/28/2018   Hypothermia 02/28/2018   Stage II pressure ulcer of sacral region ([place]) 02/15/2018   DM II (diabetes mellitus, type II), controlled ([place]) 02/09/2018   Schizophrenia ([place]) 02/09/2018   Decubitus ulcer of right leg 02/06/2018  Acute renal failure (ARF) ([place]) 02/05/2018   Hyperlipidemia associated with type 2 diabetes mellitus ([place]) 02/05/2018   Hyperkalemia 02/05/2018   Dermatitis 02/05/2018   Open back wound 02/05/2018   Dysuria 02/05/2018   Paranoid schizophrenia ([place])    AKI (acute kidney injury) ([place]) 12/17/2017   Type II diabetes mellitus with renal manifestations ([place]) 12/17/2017   HTN (hypertension) 12/17/2017   Gout    PCP:  Pcp, No Pharmacy:   [facility] [place] [address] [place] [address] Phone: [phone] Fax: [phone]  [id number] - [place],  [address] [address] [place] [address] Phone: [phone] Fax: [phone]     Social Determinants of Health (SDOH) Social History: [place]: No Food Insecurity (02/11/2022)  Housing: Low Risk  (02/11/2022)  Transportation Needs: No Transportation Needs (02/11/2022)  Utilities: Not At Risk (02/11/2022)  Tobacco Use: Low Risk  (02/10/2022)   SDOH Interventions:     Readmission Risk Interventions     No data to display

## 2022-02-13 NOTE — Plan of Care (Signed)

## 2022-02-14 MED FILL — Dexamethasone Sodium Phosphate Inj 100 MG/10ML: INTRAMUSCULAR | Qty: 1 | Status: AC

## 2022-02-15 ENCOUNTER — Other Ambulatory Visit: Payer: Self-pay

## 2022-02-15 ENCOUNTER — Inpatient Hospital Stay: Payer: Medicare Other | Attending: Physician Assistant

## 2022-02-15 ENCOUNTER — Inpatient Hospital Stay (HOSPITAL_BASED_OUTPATIENT_CLINIC_OR_DEPARTMENT_OTHER): Payer: Medicare Other | Admitting: Physician Assistant

## 2022-02-15 ENCOUNTER — Other Ambulatory Visit: Payer: Self-pay | Admitting: Hematology and Oncology

## 2022-02-15 ENCOUNTER — Inpatient Hospital Stay: Payer: Medicare Other

## 2022-02-15 ENCOUNTER — Encounter: Payer: Self-pay | Admitting: General Practice

## 2022-02-15 VITALS — BP 108/60 | HR 72 | Temp 98.5°F | Resp 20 | Wt 215.4 lb

## 2022-02-15 DIAGNOSIS — T451X5A Adverse effect of antineoplastic and immunosuppressive drugs, initial encounter: Secondary | ICD-10-CM | POA: Insufficient documentation

## 2022-02-15 DIAGNOSIS — Z5189 Encounter for other specified aftercare: Secondary | ICD-10-CM | POA: Insufficient documentation

## 2022-02-15 DIAGNOSIS — D6959 Other secondary thrombocytopenia: Secondary | ICD-10-CM | POA: Diagnosis not present

## 2022-02-15 DIAGNOSIS — K5900 Constipation, unspecified: Secondary | ICD-10-CM | POA: Insufficient documentation

## 2022-02-15 DIAGNOSIS — Z95828 Presence of other vascular implants and grafts: Secondary | ICD-10-CM

## 2022-02-15 DIAGNOSIS — D6481 Anemia due to antineoplastic chemotherapy: Secondary | ICD-10-CM | POA: Insufficient documentation

## 2022-02-15 DIAGNOSIS — Z5111 Encounter for antineoplastic chemotherapy: Secondary | ICD-10-CM | POA: Insufficient documentation

## 2022-02-15 DIAGNOSIS — C8193 Hodgkin lymphoma, unspecified, intra-abdominal lymph nodes: Secondary | ICD-10-CM | POA: Diagnosis not present

## 2022-02-15 DIAGNOSIS — R933 Abnormal findings on diagnostic imaging of other parts of digestive tract: Secondary | ICD-10-CM | POA: Insufficient documentation

## 2022-02-15 DIAGNOSIS — C8198 Hodgkin lymphoma, unspecified, lymph nodes of multiple sites: Secondary | ICD-10-CM

## 2022-02-15 DIAGNOSIS — D701 Agranulocytosis secondary to cancer chemotherapy: Secondary | ICD-10-CM | POA: Insufficient documentation

## 2022-02-15 LAB — CULTURE, BLOOD (ROUTINE X 2)
Culture: NO GROWTH
Culture: NO GROWTH
Special Requests: ADEQUATE

## 2022-02-15 LAB — CBC WITH DIFFERENTIAL (CANCER CENTER ONLY)
Abs Immature Granulocytes: 0.94 10*3/uL — ABNORMAL HIGH (ref 0.00–0.07)
Basophils Absolute: 0 10*3/uL (ref 0.0–0.1)
Basophils Relative: 0 %
Eosinophils Absolute: 0 10*3/uL (ref 0.0–0.5)
Eosinophils Relative: 0 %
HCT: 25.5 % — ABNORMAL LOW (ref 36.0–46.0)
Hemoglobin: 8.7 g/dL — ABNORMAL LOW (ref 12.0–15.0)
Immature Granulocytes: 9 %
Lymphocytes Relative: 19 %
Lymphs Abs: 1.9 10*3/uL (ref 0.7–4.0)
MCH: 30.1 pg (ref 26.0–34.0)
MCHC: 34.1 g/dL (ref 30.0–36.0)
MCV: 88.2 fL (ref 80.0–100.0)
Monocytes Absolute: 1.4 10*3/uL — ABNORMAL HIGH (ref 0.1–1.0)
Monocytes Relative: 14 %
Neutro Abs: 5.8 10*3/uL (ref 1.7–7.7)
Neutrophils Relative %: 58 %
Platelet Count: 87 10*3/uL — ABNORMAL LOW (ref 150–400)
RBC: 2.89 MIL/uL — ABNORMAL LOW (ref 3.87–5.11)
RDW: 14.5 % (ref 11.5–15.5)
Smear Review: NORMAL
WBC Count: 10.1 10*3/uL (ref 4.0–10.5)
nRBC: 0.7 % — ABNORMAL HIGH (ref 0.0–0.2)

## 2022-02-15 LAB — CMP (CANCER CENTER ONLY)
ALT: 57 U/L — ABNORMAL HIGH (ref 0–44)
AST: 43 U/L — ABNORMAL HIGH (ref 15–41)
Albumin: 3.1 g/dL — ABNORMAL LOW (ref 3.5–5.0)
Alkaline Phosphatase: 219 U/L — ABNORMAL HIGH (ref 38–126)
Anion gap: 7 (ref 5–15)
BUN: 21 mg/dL (ref 8–23)
CO2: 24 mmol/L (ref 22–32)
Calcium: 8.4 mg/dL — ABNORMAL LOW (ref 8.9–10.3)
Chloride: 106 mmol/L (ref 98–111)
Creatinine: 0.89 mg/dL (ref 0.44–1.00)
GFR, Estimated: >60 mL/min (ref >60)
Glucose, Bld: 167 mg/dL — ABNORMAL HIGH (ref 70–99)
Potassium: 3.4 mmol/L — ABNORMAL LOW (ref 3.5–5.1)
Sodium: 137 mmol/L (ref 135–145)
Total Bilirubin: 0.7 mg/dL (ref 0.3–1.2)
Total Protein: 5.8 g/dL — ABNORMAL LOW (ref 6.5–8.1)

## 2022-02-15 MED ORDER — VINBLASTINE SULFATE CHEMO INJECTION 1 MG/ML
6.0000 mg/m2 | Freq: Once | INTRAVENOUS | Status: AC
  Administered 2022-02-15: 12.5 mg via INTRAVENOUS
  Filled 2022-02-15: qty 12.5

## 2022-02-15 MED ORDER — SODIUM CHLORIDE 0.9 % IV SOLN: Freq: Once | INTRAVENOUS | Status: AC

## 2022-02-15 MED ORDER — DOXORUBICIN HCL CHEMO IV INJECTION 2 MG/ML
25.0000 mg/m2 | Freq: Once | INTRAVENOUS | Status: AC
  Administered 2022-02-15: 52 mg via INTRAVENOUS
  Filled 2022-02-15: qty 26

## 2022-02-15 MED ORDER — SODIUM CHLORIDE 0.9% FLUSH
10.0000 mL | Freq: Once | INTRAVENOUS | Status: AC
  Administered 2022-02-15: 10 mL

## 2022-02-15 MED ORDER — SODIUM CHLORIDE 0.9% FLUSH
10.0000 mL | INTRAVENOUS | Status: DC | PRN
  Administered 2022-02-15: 10 mL

## 2022-02-15 MED ORDER — HEPARIN SOD (PORK) LOCK FLUSH 100 UNIT/ML IV SOLN
500.0000 [IU] | Freq: Once | INTRAVENOUS | Status: AC | PRN
  Administered 2022-02-15: 500 [IU]

## 2022-02-15 MED ORDER — SODIUM CHLORIDE 0.9 % IV SOLN
375.0000 mg/m2 | Freq: Once | INTRAVENOUS | Status: AC
  Administered 2022-02-15: 780 mg via INTRAVENOUS
  Filled 2022-02-15: qty 78

## 2022-02-15 MED ORDER — PALONOSETRON HCL INJECTION 0.25 MG/5ML
0.2500 mg | Freq: Once | INTRAVENOUS | Status: AC
  Administered 2022-02-15: 0.25 mg via INTRAVENOUS
  Filled 2022-02-15: qty 5

## 2022-02-15 MED ORDER — SODIUM CHLORIDE 0.9 % IV SOLN
10.0000 mg | Freq: Once | INTRAVENOUS | Status: AC
  Administered 2022-02-15: 10 mg via INTRAVENOUS
  Filled 2022-02-15: qty 10

## 2022-02-15 NOTE — Progress Notes (Signed)
[facility] Spiritual Care Note  Referred by nursing for additional layer of emotional support. Met Heather Petty briefly in infusion, but she was sleepy and overwhelmed, preferring to connect another day instead. Plan to follow up at her next treatment on 1/24.   [place], [place], [facility] Pager [phone] Voicemail [phone]

## 2022-02-15 NOTE — Patient Instructions (Signed)
[facility] ONCOLOGY  Discharge Instructions: Thank you for choosing [facility] to provide your oncology and hematology care.   If you have a lab appointment with the [facility], please go directly to the [facility] and check in at the registration area.   Wear comfortable clothing and clothing appropriate for easy access to any Portacath or PICC line.   We strive to give you quality time with your provider. You may need to reschedule your appointment if you arrive late (15 or more minutes).  Arriving late affects you and other patients whose appointments are after yours.  Also, if you miss three or more appointments without notifying the office, you may be dismissed from the clinic at the provider's discretion.      For prescription refill requests, have your pharmacy contact our office and allow 72 hours for refills to be completed.    Today you received the following chemotherapy and/or immunotherapy agents: Adriamycin/Vinblastine/DTIC      To help prevent nausea and vomiting after your treatment, we encourage you to take your nausea medication as directed.  BELOW ARE SYMPTOMS THAT SHOULD BE REPORTED IMMEDIATELY: *FEVER GREATER THAN 100.4 F (38 C) OR HIGHER *CHILLS OR SWEATING *NAUSEA AND VOMITING THAT IS NOT CONTROLLED WITH YOUR NAUSEA MEDICATION *UNUSUAL SHORTNESS OF BREATH *UNUSUAL BRUISING OR BLEEDING *URINARY PROBLEMS (pain or burning when urinating, or frequent urination) *BOWEL PROBLEMS (unusual diarrhea, constipation, pain near the anus) TENDERNESS IN MOUTH AND THROAT WITH OR WITHOUT PRESENCE OF ULCERS (sore throat, sores in mouth, or a toothache) UNUSUAL RASH, SWELLING OR PAIN  UNUSUAL VAGINAL DISCHARGE OR ITCHING   Items with * indicate a potential emergency and should be followed up as soon as possible or go to the Emergency Department if any problems should occur.  Please show the CHEMOTHERAPY ALERT CARD or IMMUNOTHERAPY ALERT  CARD at check-in to the Emergency Department and triage nurse.  Should you have questions after your visit or need to cancel or reschedule your appointment, please contact [facility]  Dept: [phone]  and follow the prompts.  Office hours are 8:00 a.m. to 4:30 p.m. Monday - Friday. Please note that voicemails left after 4:00 p.m. may not be returned until the following business day.  We are closed weekends and major holidays. You have access to a nurse at all times for urgent questions. Please call the main number to the clinic Dept: [phone] and follow the prompts.   For any non-urgent questions, you may also contact your provider using MyChart. We now offer e-Visits for anyone 24 and older to request care online for non-urgent symptoms. For details visit [email or web address].   Also download the MyChart app! Go to the app store, search "MyChart", open the app, select [facility], and log in with your MyChart username and password.

## 2022-02-15 NOTE — Progress Notes (Signed)
Per Heather Petty OK to trt today with abnormal labs, Plts 87 and pending ANC

## 2022-02-16 ENCOUNTER — Telehealth: Payer: Self-pay | Admitting: Hematology and Oncology

## 2022-02-16 ENCOUNTER — Other Ambulatory Visit: Payer: Self-pay

## 2022-02-16 NOTE — Telephone Encounter (Signed)
Called patient's daughter after being hung up on by care facility scheduler. Will attempted to call facility again to get patient set up for next appointments.

## 2022-02-16 NOTE — Telephone Encounter (Signed)
Called facility again about new upcoming appointments. Spoke with nurse. Patient will be notified.

## 2022-02-17 ENCOUNTER — Other Ambulatory Visit: Payer: Self-pay

## 2022-02-17 ENCOUNTER — Inpatient Hospital Stay: Payer: Medicare Other

## 2022-02-17 VITALS — BP 106/66 | HR 79 | Temp 98.3°F | Resp 16

## 2022-02-17 DIAGNOSIS — C8198 Hodgkin lymphoma, unspecified, lymph nodes of multiple sites: Secondary | ICD-10-CM

## 2022-02-17 DIAGNOSIS — Z5111 Encounter for antineoplastic chemotherapy: Secondary | ICD-10-CM | POA: Diagnosis not present

## 2022-02-17 MED ORDER — PEGFILGRASTIM-CBQV 6 MG/0.6ML ~~LOC~~ SOSY
6.0000 mg | PREFILLED_SYRINGE | Freq: Once | SUBCUTANEOUS | Status: AC
  Administered 2022-02-17: 6 mg via SUBCUTANEOUS
  Filled 2022-02-17: qty 0.6

## 2022-02-17 NOTE — Patient Instructions (Signed)

## 2022-02-21 ENCOUNTER — Inpatient Hospital Stay: Payer: Medicare Other

## 2022-02-21 ENCOUNTER — Telehealth: Payer: Self-pay

## 2022-02-21 ENCOUNTER — Other Ambulatory Visit: Payer: Self-pay | Admitting: Hematology and Oncology

## 2022-02-21 ENCOUNTER — Other Ambulatory Visit: Payer: Self-pay

## 2022-02-21 DIAGNOSIS — K7460 Unspecified cirrhosis of liver: Secondary | ICD-10-CM | POA: Diagnosis not present

## 2022-02-21 DIAGNOSIS — D539 Nutritional anemia, unspecified: Secondary | ICD-10-CM

## 2022-02-21 DIAGNOSIS — E7800 Pure hypercholesterolemia, unspecified: Secondary | ICD-10-CM | POA: Diagnosis not present

## 2022-02-21 DIAGNOSIS — Y838 Other surgical procedures as the cause of abnormal reaction of the patient, or of later complication, without mention of misadventure at the time of the procedure: Secondary | ICD-10-CM | POA: Diagnosis not present

## 2022-02-21 DIAGNOSIS — D61818 Other pancytopenia: Secondary | ICD-10-CM | POA: Diagnosis not present

## 2022-02-21 DIAGNOSIS — B370 Candidal stomatitis: Secondary | ICD-10-CM | POA: Diagnosis not present

## 2022-02-21 DIAGNOSIS — E1165 Type 2 diabetes mellitus with hyperglycemia: Secondary | ICD-10-CM | POA: Diagnosis not present

## 2022-02-21 DIAGNOSIS — T80218A Other infection due to central venous catheter, initial encounter: Secondary | ICD-10-CM | POA: Diagnosis not present

## 2022-02-21 DIAGNOSIS — F32A Depression, unspecified: Secondary | ICD-10-CM | POA: Diagnosis not present

## 2022-02-21 DIAGNOSIS — R7881 Bacteremia: Secondary | ICD-10-CM | POA: Diagnosis not present

## 2022-02-21 DIAGNOSIS — J45909 Unspecified asthma, uncomplicated: Secondary | ICD-10-CM | POA: Diagnosis not present

## 2022-02-21 DIAGNOSIS — D709 Neutropenia, unspecified: Secondary | ICD-10-CM | POA: Diagnosis present

## 2022-02-21 DIAGNOSIS — K219 Gastro-esophageal reflux disease without esophagitis: Secondary | ICD-10-CM | POA: Diagnosis not present

## 2022-02-21 DIAGNOSIS — E876 Hypokalemia: Secondary | ICD-10-CM | POA: Diagnosis not present

## 2022-02-21 DIAGNOSIS — Z794 Long term (current) use of insulin: Secondary | ICD-10-CM | POA: Diagnosis not present

## 2022-02-21 DIAGNOSIS — E11649 Type 2 diabetes mellitus with hypoglycemia without coma: Secondary | ICD-10-CM | POA: Diagnosis not present

## 2022-02-21 DIAGNOSIS — F200 Paranoid schizophrenia: Secondary | ICD-10-CM | POA: Diagnosis not present

## 2022-02-21 DIAGNOSIS — I10 Essential (primary) hypertension: Secondary | ICD-10-CM | POA: Diagnosis not present

## 2022-02-21 DIAGNOSIS — C8193 Hodgkin lymphoma, unspecified, intra-abdominal lymph nodes: Secondary | ICD-10-CM | POA: Diagnosis not present

## 2022-02-21 DIAGNOSIS — T8384XA Pain from genitourinary prosthetic devices, implants and grafts, initial encounter: Secondary | ICD-10-CM | POA: Diagnosis not present

## 2022-02-21 DIAGNOSIS — Y846 Urinary catheterization as the cause of abnormal reaction of the patient, or of later complication, without mention of misadventure at the time of the procedure: Secondary | ICD-10-CM | POA: Diagnosis not present

## 2022-02-21 DIAGNOSIS — L89153 Pressure ulcer of sacral region, stage 3: Secondary | ICD-10-CM | POA: Diagnosis not present

## 2022-02-21 DIAGNOSIS — Z6841 Body Mass Index (BMI) 40.0 and over, adult: Secondary | ICD-10-CM | POA: Diagnosis not present

## 2022-02-21 DIAGNOSIS — R64 Cachexia: Secondary | ICD-10-CM | POA: Diagnosis not present

## 2022-02-21 DIAGNOSIS — C8198 Hodgkin lymphoma, unspecified, lymph nodes of multiple sites: Secondary | ICD-10-CM

## 2022-02-21 DIAGNOSIS — Z1152 Encounter for screening for COVID-19: Secondary | ICD-10-CM | POA: Diagnosis not present

## 2022-02-21 DIAGNOSIS — M109 Gout, unspecified: Secondary | ICD-10-CM | POA: Diagnosis not present

## 2022-02-21 DIAGNOSIS — Z95828 Presence of other vascular implants and grafts: Secondary | ICD-10-CM

## 2022-02-21 LAB — CMP (CANCER CENTER ONLY)
ALT: 18 U/L (ref 0–44)
AST: 11 U/L — ABNORMAL LOW (ref 15–41)
Albumin: 3.3 g/dL — ABNORMAL LOW (ref 3.5–5.0)
Alkaline Phosphatase: 139 U/L — ABNORMAL HIGH (ref 38–126)
Anion gap: 12 (ref 5–15)
BUN: 18 mg/dL (ref 8–23)
CO2: 21 mmol/L — ABNORMAL LOW (ref 22–32)
Calcium: 8.8 mg/dL — ABNORMAL LOW (ref 8.9–10.3)
Chloride: 103 mmol/L (ref 98–111)
Creatinine: 0.98 mg/dL (ref 0.44–1.00)
GFR, Estimated: >60 mL/min (ref >60)
Glucose, Bld: 240 mg/dL — ABNORMAL HIGH (ref 70–99)
Potassium: 4.1 mmol/L (ref 3.5–5.1)
Sodium: 136 mmol/L (ref 135–145)
Total Bilirubin: 1.2 mg/dL (ref 0.3–1.2)
Total Protein: 6.1 g/dL — ABNORMAL LOW (ref 6.5–8.1)

## 2022-02-21 MED ORDER — HEPARIN SOD (PORK) LOCK FLUSH 100 UNIT/ML IV SOLN
500.0000 [IU] | Freq: Once | INTRAVENOUS | Status: AC
  Administered 2022-02-21: 500 [IU] via INTRAVENOUS

## 2022-02-21 MED ORDER — SODIUM CHLORIDE 0.9% FLUSH
10.0000 mL | INTRAVENOUS | Status: DC | PRN
  Administered 2022-02-21: 10 mL via INTRAVENOUS

## 2022-02-21 NOTE — Telephone Encounter (Signed)
After patient completed labs today, Heather Petty in the lab called to say that HGB was 6.7 and WBC 0.6. The tube of blood had not been initialed when drawn so lab value was not accepted and the labs were to be redrawn. Dr. Lorenso Courier aware. Dr. Lorenso Courier gave verbal order for 1 unit of PRBC's.   This RN spoke with patient and requested that patient return to lab for re-draw but patient adamant that she did not want to do so. Patient did agree to return to [facility] tomorrow at 10:30 AM for labs and then possible blood transfusion at 11:30 AM.  This RN confirmed appointments with Solicitor and patient's nurse at [facility].

## 2022-02-22 ENCOUNTER — Other Ambulatory Visit: Payer: Self-pay

## 2022-02-22 ENCOUNTER — Telehealth: Payer: Self-pay

## 2022-02-22 ENCOUNTER — Inpatient Hospital Stay: Payer: Medicare Other

## 2022-02-22 ENCOUNTER — Ambulatory Visit: Payer: Medicare Other | Admitting: Gastroenterology

## 2022-02-22 DIAGNOSIS — D539 Nutritional anemia, unspecified: Secondary | ICD-10-CM

## 2022-02-22 DIAGNOSIS — T80218A Other infection due to central venous catheter, initial encounter: Secondary | ICD-10-CM | POA: Diagnosis not present

## 2022-02-22 DIAGNOSIS — Z95828 Presence of other vascular implants and grafts: Secondary | ICD-10-CM

## 2022-02-22 DIAGNOSIS — C8173 Other classical Hodgkin lymphoma, intra-abdominal lymph nodes: Secondary | ICD-10-CM

## 2022-02-22 DIAGNOSIS — C8198 Hodgkin lymphoma, unspecified, lymph nodes of multiple sites: Secondary | ICD-10-CM

## 2022-02-22 LAB — CBC WITH DIFFERENTIAL (CANCER CENTER ONLY)
Abs Immature Granulocytes: 0 10*3/uL (ref 0.00–0.07)
Basophils Absolute: 0 10*3/uL (ref 0.0–0.1)
Basophils Relative: 0 %
Eosinophils Absolute: 0 10*3/uL (ref 0.0–0.5)
Eosinophils Relative: 0 %
HCT: 19.1 % — ABNORMAL LOW (ref 36.0–46.0)
Hemoglobin: 6.6 g/dL — CL (ref 12.0–15.0)
Immature Granulocytes: 0 %
Lymphocytes Relative: 87 %
Lymphs Abs: 0.3 10*3/uL — ABNORMAL LOW (ref 0.7–4.0)
MCH: 30 pg (ref 26.0–34.0)
MCHC: 34.6 g/dL (ref 30.0–36.0)
MCV: 86.8 fL (ref 80.0–100.0)
Monocytes Absolute: 0 10*3/uL — ABNORMAL LOW (ref 0.1–1.0)
Monocytes Relative: 8 %
Neutro Abs: 0 10*3/uL — CL (ref 1.7–7.7)
Neutrophils Relative %: 5 %
Platelet Count: 75 10*3/uL — ABNORMAL LOW (ref 150–400)
RBC: 2.2 MIL/uL — ABNORMAL LOW (ref 3.87–5.11)
RDW: 13.7 % (ref 11.5–15.5)
Smear Review: NORMAL
WBC Count: 0.4 10*3/uL — CL (ref 4.0–10.5)
nRBC: 0 % (ref 0.0–0.2)

## 2022-02-22 LAB — CMP (CANCER CENTER ONLY)
ALT: 21 U/L (ref 0–44)
AST: 13 U/L — ABNORMAL LOW (ref 15–41)
Albumin: 3.4 g/dL — ABNORMAL LOW (ref 3.5–5.0)
Alkaline Phosphatase: 129 U/L — ABNORMAL HIGH (ref 38–126)
Anion gap: 9 (ref 5–15)
BUN: 23 mg/dL (ref 8–23)
CO2: 23 mmol/L (ref 22–32)
Calcium: 8.8 mg/dL — ABNORMAL LOW (ref 8.9–10.3)
Chloride: 100 mmol/L (ref 98–111)
Creatinine: 0.98 mg/dL (ref 0.44–1.00)
GFR, Estimated: >60 mL/min (ref >60)
Glucose, Bld: 221 mg/dL — ABNORMAL HIGH (ref 70–99)
Potassium: 4.1 mmol/L (ref 3.5–5.1)
Sodium: 132 mmol/L — ABNORMAL LOW (ref 135–145)
Total Bilirubin: 1.3 mg/dL — ABNORMAL HIGH (ref 0.3–1.2)
Total Protein: 6.2 g/dL — ABNORMAL LOW (ref 6.5–8.1)

## 2022-02-22 LAB — SAMPLE TO BLOOD BANK

## 2022-02-22 LAB — PREPARE RBC (CROSSMATCH)

## 2022-02-22 MED ORDER — OXYCODONE HCL 5 MG PO TABS
5.0000 mg | ORAL_TABLET | Freq: Once | ORAL | Status: AC
  Administered 2022-02-22: 5 mg via ORAL
  Filled 2022-02-22: qty 1

## 2022-02-22 MED ORDER — SODIUM CHLORIDE 0.9% FLUSH
10.0000 mL | Freq: Once | INTRAVENOUS | Status: AC
  Administered 2022-02-22: 10 mL

## 2022-02-22 MED ORDER — SODIUM CHLORIDE 0.9% IV SOLUTION: 250.0000 mL | Freq: Once | INTRAVENOUS | Status: DC

## 2022-02-22 MED ORDER — ACETAMINOPHEN 325 MG PO TABS
650.0000 mg | ORAL_TABLET | Freq: Once | ORAL | Status: AC
  Administered 2022-02-22: 650 mg via ORAL
  Filled 2022-02-22: qty 2

## 2022-02-22 MED ORDER — SODIUM CHLORIDE 0.9% FLUSH
10.0000 mL | INTRAVENOUS | Status: AC | PRN
  Administered 2022-02-22: 10 mL

## 2022-02-22 MED ORDER — HEPARIN SOD (PORK) LOCK FLUSH 100 UNIT/ML IV SOLN
500.0000 [IU] | Freq: Every day | INTRAVENOUS | Status: AC | PRN
  Administered 2022-02-22: 500 [IU]

## 2022-02-22 NOTE — Patient Instructions (Signed)

## 2022-02-22 NOTE — Telephone Encounter (Signed)
Spoke with nurse, Eulas Post, at [facility] to report patient complaints of pain which patient associates to bed sore. He will place request for wound care nurse to evaluate patient when she returns to facility.

## 2022-02-22 NOTE — Telephone Encounter (Signed)
Type and screen and prepare blood orders released. Lab can see oders on their end.

## 2022-02-23 ENCOUNTER — Emergency Department (HOSPITAL_COMMUNITY): Payer: Medicare Other

## 2022-02-23 ENCOUNTER — Other Ambulatory Visit: Payer: Self-pay

## 2022-02-23 ENCOUNTER — Inpatient Hospital Stay (HOSPITAL_COMMUNITY)
Admission: EM | Admit: 2022-02-23 | Discharge: 2022-03-02 | DRG: 314 | Disposition: A | Discharge status: Skilled Nursing Facility | Payer: Medicare Other | Source: Skilled Nursing Facility | Attending: Internal Medicine | Admitting: Internal Medicine

## 2022-02-23 DIAGNOSIS — R7881 Bacteremia: Secondary | ICD-10-CM | POA: Diagnosis present

## 2022-02-23 DIAGNOSIS — Y838 Other surgical procedures as the cause of abnormal reaction of the patient, or of later complication, without mention of misadventure at the time of the procedure: Secondary | ICD-10-CM | POA: Diagnosis present

## 2022-02-23 DIAGNOSIS — E7800 Pure hypercholesterolemia, unspecified: Secondary | ICD-10-CM | POA: Diagnosis present

## 2022-02-23 DIAGNOSIS — K7460 Unspecified cirrhosis of liver: Secondary | ICD-10-CM | POA: Diagnosis not present

## 2022-02-23 DIAGNOSIS — R5081 Fever presenting with conditions classified elsewhere: Secondary | ICD-10-CM | POA: Diagnosis present

## 2022-02-23 DIAGNOSIS — R64 Cachexia: Secondary | ICD-10-CM | POA: Diagnosis not present

## 2022-02-23 DIAGNOSIS — L089 Local infection of the skin and subcutaneous tissue, unspecified: Secondary | ICD-10-CM | POA: Diagnosis present

## 2022-02-23 DIAGNOSIS — Y846 Urinary catheterization as the cause of abnormal reaction of the patient, or of later complication, without mention of misadventure at the time of the procedure: Secondary | ICD-10-CM | POA: Diagnosis not present

## 2022-02-23 DIAGNOSIS — T451X5A Adverse effect of antineoplastic and immunosuppressive drugs, initial encounter: Secondary | ICD-10-CM | POA: Diagnosis present

## 2022-02-23 DIAGNOSIS — K7581 Nonalcoholic steatohepatitis (NASH): Secondary | ICD-10-CM | POA: Diagnosis present

## 2022-02-23 DIAGNOSIS — R21 Rash and other nonspecific skin eruption: Secondary | ICD-10-CM | POA: Diagnosis present

## 2022-02-23 DIAGNOSIS — E876 Hypokalemia: Secondary | ICD-10-CM | POA: Diagnosis present

## 2022-02-23 DIAGNOSIS — J45909 Unspecified asthma, uncomplicated: Secondary | ICD-10-CM | POA: Diagnosis present

## 2022-02-23 DIAGNOSIS — L89153 Pressure ulcer of sacral region, stage 3: Secondary | ICD-10-CM | POA: Diagnosis not present

## 2022-02-23 DIAGNOSIS — F32A Depression, unspecified: Secondary | ICD-10-CM | POA: Diagnosis present

## 2022-02-23 DIAGNOSIS — Z888 Allergy status to other drugs, medicaments and biological substances status: Secondary | ICD-10-CM

## 2022-02-23 DIAGNOSIS — T8384XA Pain from genitourinary prosthetic devices, implants and grafts, initial encounter: Secondary | ICD-10-CM | POA: Diagnosis not present

## 2022-02-23 DIAGNOSIS — C8193 Hodgkin lymphoma, unspecified, intra-abdominal lymph nodes: Secondary | ICD-10-CM | POA: Diagnosis not present

## 2022-02-23 DIAGNOSIS — M109 Gout, unspecified: Secondary | ICD-10-CM | POA: Diagnosis present

## 2022-02-23 DIAGNOSIS — Z6841 Body Mass Index (BMI) 40.0 and over, adult: Secondary | ICD-10-CM | POA: Diagnosis not present

## 2022-02-23 DIAGNOSIS — B952 Enterococcus as the cause of diseases classified elsewhere: Secondary | ICD-10-CM | POA: Diagnosis present

## 2022-02-23 DIAGNOSIS — E11649 Type 2 diabetes mellitus with hypoglycemia without coma: Secondary | ICD-10-CM | POA: Diagnosis not present

## 2022-02-23 DIAGNOSIS — Z1152 Encounter for screening for COVID-19: Secondary | ICD-10-CM | POA: Diagnosis not present

## 2022-02-23 DIAGNOSIS — F200 Paranoid schizophrenia: Secondary | ICD-10-CM | POA: Diagnosis present

## 2022-02-23 DIAGNOSIS — D61818 Other pancytopenia: Secondary | ICD-10-CM | POA: Diagnosis present

## 2022-02-23 DIAGNOSIS — Z794 Long term (current) use of insulin: Secondary | ICD-10-CM | POA: Diagnosis not present

## 2022-02-23 DIAGNOSIS — Z825 Family history of asthma and other chronic lower respiratory diseases: Secondary | ICD-10-CM

## 2022-02-23 DIAGNOSIS — G4700 Insomnia, unspecified: Secondary | ICD-10-CM | POA: Diagnosis present

## 2022-02-23 DIAGNOSIS — I10 Essential (primary) hypertension: Secondary | ICD-10-CM | POA: Diagnosis not present

## 2022-02-23 DIAGNOSIS — B372 Candidiasis of skin and nail: Secondary | ICD-10-CM

## 2022-02-23 DIAGNOSIS — S31000S Unspecified open wound of lower back and pelvis without penetration into retroperitoneum, sequela: Secondary | ICD-10-CM

## 2022-02-23 DIAGNOSIS — B370 Candidal stomatitis: Secondary | ICD-10-CM | POA: Diagnosis present

## 2022-02-23 DIAGNOSIS — E1165 Type 2 diabetes mellitus with hyperglycemia: Secondary | ICD-10-CM | POA: Diagnosis not present

## 2022-02-23 DIAGNOSIS — K219 Gastro-esophageal reflux disease without esophagitis: Secondary | ICD-10-CM | POA: Diagnosis present

## 2022-02-23 DIAGNOSIS — T80218A Other infection due to central venous catheter, initial encounter: Secondary | ICD-10-CM | POA: Diagnosis present

## 2022-02-23 DIAGNOSIS — R0902 Hypoxemia: Secondary | ICD-10-CM | POA: Diagnosis present

## 2022-02-23 DIAGNOSIS — M545 Low back pain, unspecified: Secondary | ICD-10-CM | POA: Diagnosis present

## 2022-02-23 DIAGNOSIS — Z881 Allergy status to other antibiotic agents status: Secondary | ICD-10-CM

## 2022-02-23 DIAGNOSIS — C8198 Hodgkin lymphoma, unspecified, lymph nodes of multiple sites: Secondary | ICD-10-CM

## 2022-02-23 DIAGNOSIS — D709 Neutropenia, unspecified: Secondary | ICD-10-CM | POA: Diagnosis present

## 2022-02-23 DIAGNOSIS — Z8614 Personal history of Methicillin resistant Staphylococcus aureus infection: Secondary | ICD-10-CM

## 2022-02-23 DIAGNOSIS — L299 Pruritus, unspecified: Secondary | ICD-10-CM | POA: Diagnosis present

## 2022-02-23 DIAGNOSIS — Z7984 Long term (current) use of oral hypoglycemic drugs: Secondary | ICD-10-CM

## 2022-02-23 DIAGNOSIS — K120 Recurrent oral aphthae: Secondary | ICD-10-CM | POA: Diagnosis present

## 2022-02-23 DIAGNOSIS — Z79899 Other long term (current) drug therapy: Secondary | ICD-10-CM

## 2022-02-23 DIAGNOSIS — Z8673 Personal history of transient ischemic attack (TIA), and cerebral infarction without residual deficits: Secondary | ICD-10-CM

## 2022-02-23 DIAGNOSIS — A419 Sepsis, unspecified organism: Secondary | ICD-10-CM

## 2022-02-23 DIAGNOSIS — R633 Feeding difficulties, unspecified: Secondary | ICD-10-CM | POA: Diagnosis present

## 2022-02-23 LAB — SEDIMENTATION RATE: Sed Rate: 133 mm/hr — ABNORMAL HIGH (ref 0–22)

## 2022-02-23 LAB — CBC WITH DIFFERENTIAL/PLATELET
Abs Immature Granulocytes: 0.01 10*3/uL (ref 0.00–0.07)
Basophils Absolute: 0 10*3/uL (ref 0.0–0.1)
Basophils Relative: 0 %
Eosinophils Absolute: 0 10*3/uL (ref 0.0–0.5)
Eosinophils Relative: 4 %
HCT: 19.5 % — ABNORMAL LOW (ref 36.0–46.0)
Hemoglobin: 6.5 g/dL — CL (ref 12.0–15.0)
Immature Granulocytes: 4 %
Lymphocytes Relative: 77 %
Lymphs Abs: 0.2 10*3/uL — ABNORMAL LOW (ref 0.7–4.0)
MCH: 30.5 pg (ref 26.0–34.0)
MCHC: 33.3 g/dL (ref 30.0–36.0)
MCV: 91.5 fL (ref 80.0–100.0)
Monocytes Absolute: 0 10*3/uL — ABNORMAL LOW (ref 0.1–1.0)
Monocytes Relative: 15 %
Neutro Abs: 0 10*3/uL — CL (ref 1.7–7.7)
Neutrophils Relative %: 0 %
Platelets: 42 10*3/uL — ABNORMAL LOW (ref 150–400)
RBC: 2.13 MIL/uL — ABNORMAL LOW (ref 3.87–5.11)
RDW: 14.4 % (ref 11.5–15.5)
WBC: 0.3 10*3/uL — CL (ref 4.0–10.5)
nRBC: 0 % (ref 0.0–0.2)

## 2022-02-23 LAB — IRON AND TIBC
Iron: 30 ug/dL (ref 28–170)
Saturation Ratios: 18 % (ref 10.4–31.8)
TIBC: 167 ug/dL — ABNORMAL LOW (ref 250–450)
UIBC: 137 ug/dL

## 2022-02-23 LAB — URINALYSIS, ROUTINE W REFLEX MICROSCOPIC
Bacteria, UA: NONE SEEN
Bilirubin Urine: NEGATIVE
Glucose, UA: 50 mg/dL — AB
Hgb urine dipstick: NEGATIVE
Ketones, ur: NEGATIVE mg/dL
Leukocytes,Ua: NEGATIVE
Nitrite: NEGATIVE
Protein, ur: 100 mg/dL — AB
Specific Gravity, Urine: 1.019 (ref 1.005–1.030)
pH: 5 (ref 5.0–8.0)

## 2022-02-23 LAB — FERRITIN: Ferritin: 1611 ng/mL — ABNORMAL HIGH (ref 11–307)

## 2022-02-23 LAB — HEMOGLOBIN AND HEMATOCRIT, BLOOD
HCT: 21.4 % — ABNORMAL LOW (ref 36.0–46.0)
Hemoglobin: 7.1 g/dL — ABNORMAL LOW (ref 12.0–15.0)

## 2022-02-23 LAB — BLOOD GAS, VENOUS
Acid-base deficit: 0.6 mmol/L (ref 0.0–2.0)
Bicarbonate: 23.2 mmol/L (ref 20.0–28.0)
O2 Saturation: 65.7 %
Patient temperature: 37
pCO2, Ven: 35 mmHg — ABNORMAL LOW (ref 44–60)
pH, Ven: 7.43 (ref 7.25–7.43)
pO2, Ven: 36 mmHg (ref 32–45)

## 2022-02-23 LAB — TYPE AND SCREEN
ABO/RH(D): O NEG
Antibody Screen: NEGATIVE
Unit division: 0

## 2022-02-23 LAB — BPAM RBC
Blood Product Expiration Date: 202402162359
ISSUE DATE / TIME: 202401171342
Unit Type and Rh: 9500

## 2022-02-23 LAB — PROTIME-INR
INR: 1.4 — ABNORMAL HIGH (ref 0.8–1.2)
Prothrombin Time: 17.1 seconds — ABNORMAL HIGH (ref 11.4–15.2)

## 2022-02-23 LAB — RETICULOCYTES
Immature Retic Fract: 6.4 % (ref 2.3–15.9)
RBC.: 2.01 MIL/uL — ABNORMAL LOW (ref 3.87–5.11)
Retic Count, Absolute: 4.4 10*3/uL — ABNORMAL LOW (ref 19.0–186.0)
Retic Ct Pct: <0.4 % — ABNORMAL LOW (ref 0.4–3.1)

## 2022-02-23 LAB — COMPREHENSIVE METABOLIC PANEL
ALT: 18 U/L (ref 0–44)
AST: 16 U/L (ref 15–41)
Albumin: 2.4 g/dL — ABNORMAL LOW (ref 3.5–5.0)
Alkaline Phosphatase: 79 U/L (ref 38–126)
Anion gap: 9 (ref 5–15)
BUN: 27 mg/dL — ABNORMAL HIGH (ref 8–23)
CO2: 21 mmol/L — ABNORMAL LOW (ref 22–32)
Calcium: 7.7 mg/dL — ABNORMAL LOW (ref 8.9–10.3)
Chloride: 105 mmol/L (ref 98–111)
Creatinine, Ser: 1.13 mg/dL — ABNORMAL HIGH (ref 0.44–1.00)
GFR, Estimated: 51 mL/min — ABNORMAL LOW (ref >60)
Glucose, Bld: 252 mg/dL — ABNORMAL HIGH (ref 70–99)
Potassium: 4 mmol/L (ref 3.5–5.1)
Sodium: 135 mmol/L (ref 135–145)
Total Bilirubin: 1.4 mg/dL — ABNORMAL HIGH (ref 0.3–1.2)
Total Protein: 6 g/dL — ABNORMAL LOW (ref 6.5–8.1)

## 2022-02-23 LAB — CBG MONITORING, ED: Glucose-Capillary: 214 mg/dL — ABNORMAL HIGH (ref 70–99)

## 2022-02-23 LAB — RESP PANEL BY RT-PCR (RSV, FLU A&B, COVID)  RVPGX2
Influenza A by PCR: NEGATIVE
Influenza B by PCR: NEGATIVE
Resp Syncytial Virus by PCR: NEGATIVE
SARS Coronavirus 2 by RT PCR: NEGATIVE

## 2022-02-23 LAB — PREPARE RBC (CROSSMATCH)

## 2022-02-23 LAB — LACTIC ACID, PLASMA
Lactic Acid, Venous: 1.7 mmol/L (ref 0.5–1.9)
Lactic Acid, Venous: 1.9 mmol/L (ref 0.5–1.9)
Lactic Acid, Venous: 1.5 mmol/L (ref 0.5–1.9)

## 2022-02-23 LAB — AMMONIA: Ammonia: 10 umol/L (ref 9–35)

## 2022-02-23 LAB — PROCALCITONIN: Procalcitonin: 19.65 ng/mL

## 2022-02-23 LAB — APTT: aPTT: 36 seconds (ref 24–36)

## 2022-02-23 LAB — VITAMIN B12: Vitamin B-12: 2132 pg/mL — ABNORMAL HIGH (ref 180–914)

## 2022-02-23 LAB — FOLATE: Folate: 9.6 ng/mL (ref >5.9)

## 2022-02-23 MED ORDER — ALBUTEROL SULFATE (2.5 MG/3ML) 0.083% IN NEBU: 2.5000 mg | INHALATION_SOLUTION | RESPIRATORY_TRACT | Status: DC | PRN

## 2022-02-23 MED ORDER — BOOST PLUS PO LIQD
237.0000 mL | Freq: Two times a day (BID) | ORAL | Status: DC
  Administered 2022-02-24 – 2022-03-02 (×11): 237 mL via ORAL
  Filled 2022-02-23 (×15): qty 237

## 2022-02-23 MED ORDER — LACTATED RINGERS IV BOLUS (SEPSIS)
1000.0000 mL | Freq: Once | INTRAVENOUS | Status: AC
  Administered 2022-02-23: 1000 mL via INTRAVENOUS

## 2022-02-23 MED ORDER — CARVEDILOL 6.25 MG PO TABS
6.2500 mg | ORAL_TABLET | Freq: Two times a day (BID) | ORAL | Status: DC
  Administered 2022-02-24 – 2022-03-02 (×12): 6.25 mg via ORAL
  Filled 2022-02-23 (×4): qty 1
  Filled 2022-02-23: qty 2
  Filled 2022-02-23 (×7): qty 1

## 2022-02-23 MED ORDER — SODIUM CHLORIDE 0.9% IV SOLUTION: Freq: Once | INTRAVENOUS | Status: DC

## 2022-02-23 MED ORDER — METRONIDAZOLE 500 MG/100ML IV SOLN
500.0000 mg | Freq: Two times a day (BID) | INTRAVENOUS | Status: DC
  Administered 2022-02-24 – 2022-02-25 (×3): 500 mg via INTRAVENOUS
  Filled 2022-02-23 (×3): qty 100

## 2022-02-23 MED ORDER — LORATADINE 10 MG PO TABS
10.0000 mg | ORAL_TABLET | Freq: Every day | ORAL | Status: DC
  Administered 2022-02-24 – 2022-03-02 (×7): 10 mg via ORAL
  Filled 2022-02-23 (×7): qty 1

## 2022-02-23 MED ORDER — ACETAMINOPHEN 500 MG PO TABS
1000.0000 mg | ORAL_TABLET | Freq: Three times a day (TID) | ORAL | Status: DC | PRN
  Administered 2022-02-23 – 2022-02-28 (×7): 1000 mg via ORAL
  Filled 2022-02-23 (×8): qty 2

## 2022-02-23 MED ORDER — DOXEPIN HCL 25 MG PO CAPS: 25.0000 mg | ORAL_CAPSULE | Freq: Every day | ORAL | Status: DC

## 2022-02-23 MED ORDER — FLUTICASONE PROPIONATE 50 MCG/ACT NA SUSP
1.0000 | Freq: Every day | NASAL | Status: DC
  Administered 2022-02-24 – 2022-03-02 (×5): 1 via NASAL
  Filled 2022-02-23 (×2): qty 16

## 2022-02-23 MED ORDER — VITAMIN D 25 MCG (1000 UNIT) PO TABS
2000.0000 [IU] | ORAL_TABLET | Freq: Every day | ORAL | Status: DC
  Administered 2022-02-24 – 2022-03-02 (×7): 2000 [IU] via ORAL
  Filled 2022-02-23 (×7): qty 2

## 2022-02-23 MED ORDER — VANCOMYCIN HCL 750 MG/150ML IV SOLN
750.0000 mg | INTRAVENOUS | Status: DC
  Filled 2022-02-23: qty 150

## 2022-02-23 MED ORDER — INSULIN GLARGINE-YFGN 100 UNIT/ML ~~LOC~~ SOLN
38.0000 [IU] | Freq: Every morning | SUBCUTANEOUS | Status: DC
  Administered 2022-02-24 – 2022-02-26 (×3): 38 [IU] via SUBCUTANEOUS
  Filled 2022-02-23 (×4): qty 0.38

## 2022-02-23 MED ORDER — SODIUM CHLORIDE 0.9 % IV SOLN: 2.0000 g | Freq: Two times a day (BID) | INTRAVENOUS | Status: DC

## 2022-02-23 MED ORDER — ALLOPURINOL 100 MG PO TABS
100.0000 mg | ORAL_TABLET | Freq: Every day | ORAL | Status: DC
  Administered 2022-02-24 – 2022-03-02 (×7): 100 mg via ORAL
  Filled 2022-02-23 (×7): qty 1

## 2022-02-23 MED ORDER — PROCHLORPERAZINE MALEATE 10 MG PO TABS
10.0000 mg | ORAL_TABLET | Freq: Four times a day (QID) | ORAL | Status: DC | PRN
  Administered 2022-02-26: 10 mg via ORAL
  Filled 2022-02-23: qty 1

## 2022-02-23 MED ORDER — GERHARDT'S BUTT CREAM
TOPICAL_CREAM | Freq: Four times a day (QID) | CUTANEOUS | Status: DC
  Administered 2022-02-25 – 2022-03-02 (×4): 1 via TOPICAL
  Filled 2022-02-23 (×3): qty 1

## 2022-02-23 MED ORDER — METRONIDAZOLE 500 MG/100ML IV SOLN
500.0000 mg | Freq: Once | INTRAVENOUS | Status: AC
  Administered 2022-02-23: 500 mg via INTRAVENOUS
  Filled 2022-02-23: qty 100

## 2022-02-23 MED ORDER — SODIUM CHLORIDE 0.9 % IV SOLN
2.0000 g | Freq: Once | INTRAVENOUS | Status: AC
  Administered 2022-02-23: 2 g via INTRAVENOUS
  Filled 2022-02-23: qty 12.5

## 2022-02-23 MED ORDER — ALBUTEROL SULFATE HFA 108 (90 BASE) MCG/ACT IN AERS: 2.0000 | INHALATION_SPRAY | RESPIRATORY_TRACT | Status: DC | PRN

## 2022-02-23 MED ORDER — SODIUM CHLORIDE 0.9 % IV SOLN
2.0000 g | Freq: Two times a day (BID) | INTRAVENOUS | Status: DC
  Administered 2022-02-23 – 2022-02-24 (×2): 2 g via INTRAVENOUS
  Filled 2022-02-23 (×2): qty 12.5

## 2022-02-23 MED ORDER — AMLODIPINE BESYLATE 10 MG PO TABS
10.0000 mg | ORAL_TABLET | Freq: Every day | ORAL | Status: DC
  Administered 2022-02-24 – 2022-03-02 (×7): 10 mg via ORAL
  Filled 2022-02-23 (×3): qty 1
  Filled 2022-02-23: qty 2
  Filled 2022-02-23 (×3): qty 1

## 2022-02-23 MED ORDER — HYDROXYZINE HCL 25 MG PO TABS
25.0000 mg | ORAL_TABLET | Freq: Two times a day (BID) | ORAL | Status: DC | PRN
  Administered 2022-02-27 – 2022-03-01 (×5): 25 mg via ORAL
  Filled 2022-02-23 (×5): qty 1

## 2022-02-23 MED ORDER — LISINOPRIL 5 MG PO TABS
5.0000 mg | ORAL_TABLET | Freq: Every day | ORAL | Status: DC
  Administered 2022-02-24 – 2022-03-02 (×7): 5 mg via ORAL
  Filled 2022-02-23 (×7): qty 1

## 2022-02-23 MED ORDER — TBO-FILGRASTIM 480 MCG/0.8ML ~~LOC~~ SOSY: 480.0000 ug | PREFILLED_SYRINGE | Freq: Every day | SUBCUTANEOUS | Status: DC

## 2022-02-23 MED ORDER — VANCOMYCIN HCL 2000 MG/400ML IV SOLN
2000.0000 mg | Freq: Once | INTRAVENOUS | Status: AC
  Administered 2022-02-23: 2000 mg via INTRAVENOUS
  Filled 2022-02-23: qty 400

## 2022-02-23 MED ORDER — INSULIN GLARGINE-YFGN 100 UNIT/ML ~~LOC~~ SOLN
28.0000 [IU] | Freq: Every day | SUBCUTANEOUS | Status: DC
  Administered 2022-02-23 – 2022-02-26 (×4): 28 [IU] via SUBCUTANEOUS
  Filled 2022-02-23 (×5): qty 0.28

## 2022-02-23 MED ORDER — IOHEXOL 300 MG/ML  SOLN
100.0000 mL | Freq: Once | INTRAMUSCULAR | Status: AC | PRN
  Administered 2022-02-23: 100 mL via INTRAVENOUS

## 2022-02-23 MED ORDER — LACTATED RINGERS IV SOLN: INTRAVENOUS | Status: AC

## 2022-02-23 MED ORDER — VANCOMYCIN HCL 2000 MG/400ML IV SOLN: 2000.0000 mg | Freq: Once | INTRAVENOUS | Status: DC

## 2022-02-23 NOTE — Progress Notes (Signed)
PHARMACY -  BRIEF ANTIBIOTIC NOTE   Pharmacy has received consult(s) for vancomycin and cefepime from an ED provider.  The patient's profile has been reviewed for ht/wt/allergies/indication/available labs.    Patient with allergy listed to vancomycin, added in 2021. Since then, patient has received vancomycin multiple times -  most recently, earlier this month.  One time order(s) placed for vancomycin 2 g + cefepime 2 g  Further antibiotics/pharmacy consults should be ordered by admitting physician if indicated.                       Thank you,  Tawnya Crook, PharmD, BCPS Clinical Pharmacist 02/23/2022 12:24 PM

## 2022-02-23 NOTE — H&P (Signed)
History and Physical    Heather Petty WUX:[id number] DOB: [birth year] DOA: 02/23/2022  PCP: Pcp, No  Patient coming from: home/[facility]  I have personally briefly reviewed patient's old medical records in [facility]  Chief Complaint: fever,hypoxia, change in ms  HPI: Heather Petty is a 72 y.o. female with medical history significant of   DM2, schizophrenia, sacral ulcer, thigh ulcer, Hodgkin lymphoma currently on chemotherapy with most recent dose 1/10 (AVD q28dx6cycles, day 1 cycle 2), patient has similar episode for which she was admitted 1/5 at which time patient was treated with Granix, and started on Cefepime. Blood cultures at that time returned no growth, urine cultures noted multiple organisms. Patient has improvement with treatment and was discharged back to NH with oral course of abx. Patient now returns to ED from NH with fever of 103, hypoxemia as well as change in mental status from baseline. Of note patient also has interim history of being seen in infusion clinic for hgb of  6.6 and was transfused 1 unit prbc at that time. Patient currently notes no sob/chest pain/ cough /n/v/d or dysuria. Patient does note pain inner thigh and perineal area.  ED Course:  Tmx 101.9, bP93/43, rr 26 hr 88 NA 135, K 4, cr 1.13 (0.98), bicarb 21, glu 252,  tbili 1.4, inr 1.4 Lacitc 1.7 Wbc 0.3( 10.1), hgb 6.5 ( 6.6 for which patient got 1 unit prbc 1/17) plt 42(75) ANC 0(severe neutropenia)  UA: neg Cxr:NAD CTAB/pelvis 1. Decubitus ulcers along the left buttock extending towards the left posterior iliac bone, and along the upper margin of the gluteal cleft. Both have edema extending towards the adjacent bony structures but no findings of osteomyelitis or separate drainable abscess. 2. New band of hypodensity extending in the spleen, differential diagnostic considerations include small splenic infarct, heterogeneous enhancement related to early contrast phase,  lymphoma related lesion with atypical morphology, or less likely a small splenic laceration. No perisplenic ascites to further suggest recent splenic trauma. 3. Stable retroperitoneal adenopathy. 4. Pelvic floor laxity and cystocele. 5. Sigmoid colon diverticulosis. 6. Cholelithiasis. 7. Atheromatous plaque at the origins of the celiac trunk and SMA may be causing some degree of stenosis, but both vessels opacify and accordingly no overt occlusion is identified. 8. Dextroconvex lumbar scoliosis with rotary component. Multilevel lumbar impingement due to spondylosis and degenerative disc disease. 9. Aortic atherosclerosis.   OZD:GUYQI low voltage poor baseline ( repeat pending ) Tx vanc /cefepime,metronidazole LR1L Review of Systems: As per HPI otherwise 10 point review of systems negative.   Past Medical History:  Diagnosis Date   AKI (acute kidney injury) ([place]) 12/17/2017   Allergic rhinitis    Anemia    Arthritis    Asthma    Autonomic neuropathy    Depression    Diabetes mellitus    Diabetes mellitus without complication (HCC)    E coli infection    GERD (gastroesophageal reflux disease)    GERD (gastroesophageal reflux disease)    Gout    Hepatitis A    High cholesterol    Hypertension    Insomnia    Kidney failure    Metabolic encephalopathy    Morbid obesity ([place])    NASH (nonalcoholic steatohepatitis)    Pneumonia 12/17/2017   Schizophrenia ([place])    Stroke ([place])    Vitamin B deficiency    Vitamin D deficiency     Past Surgical History:  Procedure Laterality Date   CYST EXCISION     buttucks  DENTAL SURGERY     IR IMAGING GUIDED PORT INSERTION  01/10/2022   TONSILLECTOMY       reports that she has never smoked. She has never used smokeless tobacco. She reports that she does not drink alcohol and does not use drugs.  Allergies  Allergen Reactions   Emend [Aprepitant] Other (See Comments)    Pt experienced shooting back pain at 10/10 when medication  was administered the first time. See Hypersensitivity note from 01/17/2022.   Vancomycin Other (See Comments)    Patient becomes hypotensive, lethargic and itchy. Has tolerated it many times.   Fosaprepitant Other (See Comments)    Back pain / hypersensitivity 01/17/2022   Lasix [Furosemide] Other (See Comments)    IV-LASIX ==> Reaction: Burning   Lasix [Furosemide] Other (See Comments)    On MAR    Family History  Adopted: Yes  Problem Relation Age of Onset   COPD Daughter     Prior to Admission medications   Medication Sig Start Date End Date Taking? Authorizing Provider  acetaminophen (TYLENOL) 500 MG tablet Take 1,000 mg by mouth 3 (three) times daily as needed for moderate pain.   Yes [provider]  albuterol (VENTOLIN HFA) 108 (90 Base) MCG/ACT inhaler Inhale 2 puffs into the lungs every 4 (four) hours as needed for wheezing or shortness of breath.   Yes [provider]  allopurinol (ZYLOPRIM) 100 MG tablet Take 100 mg by mouth daily. 01/19/22  Yes [provider]  amLODipine (NORVASC) 10 MG tablet Take 10 mg by mouth daily.   Yes [provider]  carvedilol (COREG) 6.25 MG tablet Take 6.25 mg by mouth 2 (two) times daily with a meal.   Yes [provider]  cholecalciferol (VITAMIN D3) 25 MCG (1000 UNIT) tablet Take 2,000 Units by mouth daily.   Yes [provider]  doxepin (SINEQUAN) 25 MG capsule Take 25 mg by mouth at bedtime.   Yes [provider]  famotidine (PEPCID) 20 MG tablet Take 20 mg by mouth 2 (two) times daily.   Yes [provider]  fluticasone (FLONASE) 50 MCG/ACT nasal spray Place 1 spray into both nostrils daily.   Yes [provider]  glipiZIDE (GLUCOTROL XL) 10 MG 24 hr tablet Take 10 mg by mouth 2 (two) times daily.    Yes [provider]  guaiFENesin (MUCINEX) 600 MG 12 hr tablet Take 600 mg by mouth every 12 (twelve) hours.   Yes [provider]  hydrOXYzine  (ATARAX/VISTARIL) 25 MG tablet Take 25 mg by mouth in the morning and at bedtime.   Yes [provider]  insulin aspart (NOVOLOG) 100 UNIT/ML injection Inject 0-12 Units into the skin See admin instructions. Inject 0-12 units into the skin 4 times a day (before meals and at bedtime) per sliding scale: BGL <60 or >400  = CALL MD; 60-200 = 0 units; 201-250 = 2 units; 251-300 = 4 units; 301-350 = 6 units; 351-400 = 8 units; 401-450 = 10 units; >450 = 12 units 07/31/19  Yes Annita Brod, MD  lactose free nutrition (BOOST) LIQD Take 237 mLs by mouth 2 (two) times daily between meals.   Yes [provider]  LANTUS SOLOSTAR 100 UNIT/ML Solostar Pen Inject 28-38 Units into the skin 2 (two) times daily. Inject 38 units in the morning and Inject 28 units at bedtime 02/02/22  Yes [provider]  lidocaine 4 % Place 1 patch onto the skin daily.   Yes  [provider]  lidocaine-prilocaine (EMLA) cream Apply 1 Application topically as needed. Patient taking differently: Apply 1 Application topically as needed (access port). 01/16/22  Yes Dede Query T, PA-C  lisinopril (ZESTRIL) 5 MG tablet Take 5 mg by mouth daily.   Yes [provider]  loperamide (IMODIUM) 2 MG capsule Take 1 capsule (2 mg total) by mouth as needed for diarrhea or loose stools. 09/09/19  Yes Pokhrel, Laxman, MD  loratadine (CLARITIN) 10 MG tablet Take 10 mg by mouth daily.   Yes [provider]  mupirocin ointment (BACTROBAN) 2 % Apply 1 Application topically 2 (two) times daily.   Yes [provider]  Pollen Extracts (PROSTAT PO) Take 30 mLs by mouth in the morning and at bedtime.   Yes [provider]  Polyethyl Glycol-Propyl Glycol (SYSTANE ULTRA) 0.4-0.3 % SOLN Place 2 drops into both eyes in the morning and at bedtime.   Yes [provider]  polyethylene glycol (MIRALAX / GLYCOLAX) 17 g packet Take 17 g by mouth daily as needed for mild constipation.   Yes  [provider]  prochlorperazine (COMPAZINE) 10 MG tablet Take 1 tablet (10 mg total) by mouth every 6 (six) hours as needed for nausea or vomiting. 01/16/22  Yes Thayil, Irene T, PA-C  Simethicone (GAS-X ULTRA STRENGTH) 180 MG CAPS Take 1 capsule by mouth every 8 (eight) hours as needed (gas).   Yes [provider]  Skin Protectants, Misc. (EUCERIN) cream Apply 1 Application topically 2 (two) times daily. Where skin is peeling   Yes [provider]  zinc oxide 20 % ointment Apply 1 Application topically See admin instructions. Every shift and as needed (bilateral buttock)   Yes [provider]    Physical Exam: Vitals:   02/23/22 1530 02/23/22 1639 02/23/22 1700 02/23/22 1724  BP: (!) 139/56 133/60 116/60 (!) 117/53  Pulse: (!) 102 (!) 107 (!) 104 (!) 104  Resp: (!) 21 (!) 25 (!) 25 (!) 26  Temp:  100.1 F (37.8 C)  100.3 F (37.9 C)  TempSrc:  Oral  Oral  SpO2: 95% 97% 99% 98%    Constitutional: NAD, calm, comfortable Vitals:   02/23/22 1530 02/23/22 1639 02/23/22 1700 02/23/22 1724  BP: (!) 139/56 133/60 116/60 (!) 117/53  Pulse: (!) 102 (!) 107 (!) 104 (!) 104  Resp: (!) 21 (!) 25 (!) 25 (!) 26  Temp:  100.1 F (37.8 C)  100.3 F (37.9 C)  TempSrc:  Oral  Oral  SpO2: 95% 97% 99% 98%   Eyes: PERRL, lids and conjunctivae normal ENMT: Mucous membranes are dry, Posterior pharynx clear of any exudate or lesions.Normal dentition.  Neck: normal, supple, no masses, no thyromegaly Respiratory: clear to auscultation bilaterally, no wheezing, no crackles. Normal respiratory effort. No accessory muscle use.  Cardiovascular: Regular rate and rhythm, no murmurs / rubs / gallops. No extremity edema. 2+ pedal pulses. Abdomen: no tenderness, no masses palpated. No hepatosplenomegaly. Bowel sounds positive.  Musculoskeletal: no clubbing / cyanosis. No joint deformity upper and lower extremities. Good ROM, no contractures. Normal muscle tone.  Skin: sacral  wound evaluation deferred due to patient preference Neurologic: CN 2-12 grossly intact. Sensation intact MAE x4 , lower ext 3+-4/5 Psychiatric: alert and oriented  x2   Labs on Admission: I have personally reviewed following labs and imaging studies  CBC: Recent Labs  Lab 02/22/22 1058 02/23/22 1222  WBC 0.4* 0.3*  NEUTROABS 0.0* 0.0*  HGB 6.6* 6.5*  HCT 19.1* 19.5*  MCV 86.8 91.5  PLT 75* 42*   Basic Metabolic Panel: Recent Labs  Lab 02/21/22 1043 02/22/22 1058 02/23/22 1222  NA 136 132* 135  K 4.1 4.1 4.0  CL 103 100 105  CO2 21* 23 21*  GLUCOSE 240* 221* 252*  BUN 18 23 27*  CREATININE 0.98 0.98 1.13*  CALCIUM 8.8* 8.8* 7.7*   GFR: Estimated Creatinine Clearance: 47.5 mL/min (A) (by C-G formula based on SCr of 1.13 mg/dL (H)). Liver Function Tests: Recent Labs  Lab 02/21/22 1043 02/22/22 1058 02/23/22 1222  AST 11* 13* 16  ALT '18 21 18  '$ ALKPHOS 139* 129* 79  BILITOT 1.2 1.3* 1.4*  PROT 6.1* 6.2* 6.0*  ALBUMIN 3.3* 3.4* 2.4*   No results for input(s): "LIPASE", "AMYLASE" in the last 168 hours. No results for input(s): "AMMONIA" in the last 168 hours. Coagulation Profile: Recent Labs  Lab 02/23/22 1222  INR 1.4*   Cardiac Enzymes: No results for input(s): "CKTOTAL", "CKMB", "CKMBINDEX", "TROPONINI" in the last 168 hours. BNP (last 3 results) No results for input(s): "PROBNP" in the last 8760 hours. HbA1C: No results for input(s): "HGBA1C" in the last 72 hours. CBG: No results for input(s): "GLUCAP" in the last 168 hours. Lipid Profile: No results for input(s): "CHOL", "HDL", "LDLCALC", "TRIG", "CHOLHDL", "LDLDIRECT" in the last 72 hours. Thyroid Function Tests: No results for input(s): "TSH", "T4TOTAL", "FREET4", "T3FREE", "THYROIDAB" in the last 72 hours. Anemia Panel: Recent Labs    02/23/22 1605  RETICCTPCT <0.4*   Urine analysis:    Component Value Date/Time   COLORURINE AMBER (A) 02/23/2022 1322   APPEARANCEUR CLEAR 02/23/2022 1322    LABSPEC 1.019 02/23/2022 1322   PHURINE 5.0 02/23/2022 1322   GLUCOSEU 50 (A) 02/23/2022 1322   HGBUR NEGATIVE 02/23/2022 1322   BILIRUBINUR NEGATIVE 02/23/2022 1322   KETONESUR NEGATIVE 02/23/2022 1322   PROTEINUR 100 (A) 02/23/2022 1322   UROBILINOGEN 0.2 12/27/2010 1858   NITRITE NEGATIVE 02/23/2022 1322   LEUKOCYTESUR NEGATIVE 02/23/2022 1322    Radiological Exams on Admission: CT ABDOMEN PELVIS W CONTRAST  Result Date: 02/23/2022 CLINICAL DATA:  Hodgkin's lymphoma. Sacral wound, fever, hypoxia and lethargy. * Tracking Code: BO * EXAM: CT ABDOMEN AND PELVIS WITH CONTRAST TECHNIQUE: Multidetector CT imaging of the abdomen and pelvis was performed using the standard protocol following bolus administration of intravenous contrast. RADIATION DOSE REDUCTION: This exam was performed according to the departmental dose-optimization program which includes automated exposure control, adjustment of the mA and/or kV according to patient size and/or use of iterative reconstruction technique. CONTRAST:  11m OMNIPAQUE IOHEXOL 300 MG/ML  SOLN COMPARISON:  02/10/2022 CT scan FINDINGS: Lower chest: Mild linear subsegmental atelectasis in the right lower lobe. Stable focal linear scarring in the lingula. Descending thoracic aortic atherosclerosis. Hepatobiliary: Borderline distention of the gallbladder. Small gallstones are present dependently in the gallbladder. Pancreas: Unremarkable Spleen: There is a anterolateral band of hypodensity extending in the spleen on images 20-25 of series 2 which is new compared to prior exams. Possibilities include heterogeneity related to early contrast phase, a small splenic infarct, a lymphoma related lesion with atypical morphology, or less likely a small splenic laceration. No perisplenic ascites to further suggest recent splenic trauma. Adrenals/Urinary Tract: The urinary bladder extends 1.4 cm below the pubococcygeal line, compatible with pelvic floor laxity and  cystocele. The kidneys and adrenal glands appear unremarkable. Stomach/Bowel: Sigmoid colon diverticulosis. Otherwise unremarkable. Vascular/Lymphatic: Atherosclerosis is present, including aortoiliac atherosclerotic disease. Atheromatous plaque at the origins of the celiac trunk  and SMA may be causing some degree of stenosis, but both vessels opacify and accordingly no overt occlusion is identified. There is atheromatous plaque proximally in both renal arteries. Retroperitoneal adenopathy is present, index left periaortic lymph node 1.5 cm in short axis on image 41 series 2, formerly the same. Overall no substantive change in the visualized adenopathy. Reproductive: No significant findings Other: No supplemental non-categorized findings. Musculoskeletal: Dextroconvex lumbar scoliosis with rotary component. Sclerotic Schmorl's nodes and degenerative endplate findings at several lumbar levels. Right eccentric vertical narrowing of the L5 vertebral body as before. Multilevel lumbar impingement due to spondylosis and degenerative disc disease. Focal band of subcutaneous edema extends from the left buttock towards the left posterior iliac bone on image 60 of series 2, with associated overlying cutaneous thickening. This has increased substantially since 02/10/2022 and likely rep reflects the reported decubitus ulcer. Although this extends faintly towards the periosteal margin, we do not demonstrate a deep gas-filled cavity, and there is no bony demineralization or evidence of underlying bony involvement to suggest osteomyelitis. Along the top of the gluteal cleft, there is also a suspected ulceration or lesion extending down towards the sacrum on images 66 through 72 of series 2, increased from prior. Although this abuts the posterior sacral margin there are no findings of bony demineralization or osteomyelitis. Small radiodensity along the associated wound may represent bandaging. No drainable abscess observed.  IMPRESSION: 1. Decubitus ulcers along the left buttock extending towards the left posterior iliac bone, and along the upper margin of the gluteal cleft. Both have edema extending towards the adjacent bony structures but no findings of osteomyelitis or separate drainable abscess. 2. New band of hypodensity extending in the spleen, differential diagnostic considerations include small splenic infarct, heterogeneous enhancement related to early contrast phase, lymphoma related lesion with atypical morphology, or less likely a small splenic laceration. No perisplenic ascites to further suggest recent splenic trauma. 3. Stable retroperitoneal adenopathy. 4. Pelvic floor laxity and cystocele. 5. Sigmoid colon diverticulosis. 6. Cholelithiasis. 7. Atheromatous plaque at the origins of the celiac trunk and SMA may be causing some degree of stenosis, but both vessels opacify and accordingly no overt occlusion is identified. 8. Dextroconvex lumbar scoliosis with rotary component. Multilevel lumbar impingement due to spondylosis and degenerative disc disease. 9. Aortic atherosclerosis. Aortic Atherosclerosis (ICD10-I70.0). Electronically Signed   By: Van Clines M.D.   On: 02/23/2022 15:03   DG Chest Port 1 View  Result Date: 02/23/2022 CLINICAL DATA:  Questionable sepsis - evaluate for abnormality EXAM: PORTABLE CHEST 1 VIEW COMPARISON:  February 10, 2022 FINDINGS: The cardiomediastinal silhouette is unchanged in contour.RIGHT chest port with tip terminating over the superior cavoatrial junction. Atherosclerotic calcifications. No pleural effusion. No pneumothorax. No acute pleuroparenchymal abnormality. IMPRESSION: No acute cardiopulmonary abnormality. Electronically Signed   By: Valentino Saxon M.D.   On: 02/23/2022 13:59    EKG: Independently reviewed. See above   Assessment/Plan  Neutropenic Fever  -presumed skin source with noted decubitus ulcers sacral area -CT with no mention of osteomyelitis however  noted edema suggestive of wound infection  -continue vanc/cefepime/metronidazole  -wound care to see -check inflammatory markers  -oncology consult for am ( Dr Burr Medico)  -am oncology to decide on Granix per Dr Burr Medico, if patient become unstable can give dose.  Decubitus ulcers  -wound care to see   Anemia severe  -s/p 1 unit prbc 1/17 w/o improvement  -s/p another 1 unit in ED  -await further oncology recs  -monitor h/h  - transfuse  if <7 -f/u hematology recs  Acute Metabolic Encephalopathy -due to acute medical illness/ infection  -CTH pending to r/o organic brain pathology   Hypodensity extending in the spleen -unclear significance ,? Splenic infarct -await oncology infput   Paranoid Schizophrenia -resume home  regimen once med rec completed as long not contraindicated    Hypertension  -resume home regimen in am as long as bp tolerates    DMII -hyperglycemia, uncontrolled -iss/fs  -resume long acting once med rec completed    DVT prophylaxis: scd Code Status: full/ as discussed per patient wishes in event of cardiac arrest  Family Communication:  Disposition Plan: patient  expected to be admitted greater than 2 midnights  Consults called: Dr Burr Medico , oncology  Admission status: progressive care    Clance Boll MD [facility]   If 7PM-7AM, please contact night-coverage [email or web address] Password [facility]  02/23/2022, 5:31 PM

## 2022-02-23 NOTE — Sepsis Progress Note (Signed)
eLink is following this Code Sepsis.

## 2022-02-23 NOTE — Consult Note (Signed)
[facility] Nurse Consult Note: Reason for Consult: Patent seen by this writer on 02/12/22.  Right posterior thigh wound remains, partial thickness, pink, moist. Sacral wound is new and measures 1cm round x 0.2cm, red, moist. Peeling skin at posterior thighs, perineum, back and medial thighs noted. The assistance of ED EMT is appreciated in turning and repositioning patient for assessment this evening. Wound type: shear, pressure Pressure Injury POA: Yes Measurement:As noted above Wound bed:As noted above Drainage (amount, consistency, odor) scant serous Periwound: peeling epidermis in truncal region Dressing procedure/placement/frequency: I have provided topical care guidance using a soap and water cleanse followed by topping with xeroform gauze and covering with silicone foam. Turning and repositioning to minimize time in the supine position. Gerhart's Butt cream to affected areas of peeling skin. Pressure redistribution heel boots bilaterally to prevention PI.  WOC nursing team will not follow, but will remain available to this patient, the nursing and medical teams.  Please re-consult if needed.  Thank you for inviting [place] to participate in this patient's Plan of Care.  Maudie Flakes, MSN, RN, CNS, [place], Serita Grammes, [employer], [employer] phone:  [phone]

## 2022-02-23 NOTE — ED Triage Notes (Signed)
BIBA from [facility].  Facility reports temp of 103.6, 86% on RA, and lethargic with only alert to self and place. Facility reports onset of 3 hours ago for lethargy Ems reports hypotensive on arrival and administered 239m NS with improvement Wound noted to sacrum and right leg with packing.  [id number] with ems.  Hx of leukemia and currently on chemo and radiation. Blood transfusion yesterday.  Pt does have Most Form.

## 2022-02-23 NOTE — ED Provider Notes (Signed)
[facility] DEPT Provider Note   CSN: [id number] Arrival date & time: 02/23/22  1203     History  Chief Complaint  Patient presents with   Fever     Heather Petty is a 72 y.o. female PMH Hodgkin lymphoma currently on chemotherapy, T2DM, schizophrenia. Patient is a resident of [facility] and this morning had a temperature of 103.6 and was saturating at 86% on RA. She was placed on 2L of O2 and satting 94%. Of note, patient fot blood transfusion through her chest port yesterday. Per EMS facilty says she is usually more alert than this. Patient unable to provide history on my exam but was able to say her name and that her bottom was hurting.        Home Medications Prior to Admission medications   Medication Sig Start Date End Date Taking? Authorizing Provider  acetaminophen (TYLENOL) 500 MG tablet Take 1,000 mg by mouth 3 (three) times daily as needed for moderate pain.   Yes [provider]  albuterol (VENTOLIN HFA) 108 (90 Base) MCG/ACT inhaler Inhale 2 puffs into the lungs every 4 (four) hours as needed for wheezing or shortness of breath.   Yes [provider]  allopurinol (ZYLOPRIM) 100 MG tablet Take 100 mg by mouth daily. 01/19/22  Yes [provider]  amLODipine (NORVASC) 10 MG tablet Take 10 mg by mouth daily.   Yes [provider]  carvedilol (COREG) 6.25 MG tablet Take 6.25 mg by mouth 2 (two) times daily with a meal.   Yes [provider]  cholecalciferol (VITAMIN D3) 25 MCG (1000 UNIT) tablet Take 2,000 Units by mouth daily.   Yes [provider]  doxepin (SINEQUAN) 25 MG capsule Take 25 mg by mouth at bedtime.   Yes [provider]  famotidine (PEPCID) 20 MG tablet Take 20 mg by mouth 2 (two) times daily.   Yes [provider]  fluticasone (FLONASE) 50 MCG/ACT nasal spray Place 1 spray into both nostrils daily.   Yes [provider]  glipiZIDE (GLUCOTROL  XL) 10 MG 24 hr tablet Take 10 mg by mouth 2 (two) times daily.    Yes [provider]  guaiFENesin (MUCINEX) 600 MG 12 hr tablet Take 600 mg by mouth every 12 (twelve) hours.   Yes [provider]  hydrOXYzine (ATARAX/VISTARIL) 25 MG tablet Take 25 mg by mouth in the morning and at bedtime.   Yes [provider]  insulin aspart (NOVOLOG) 100 UNIT/ML injection Inject 0-12 Units into the skin See admin instructions. Inject 0-12 units into the skin 4 times a day (before meals and at bedtime) per sliding scale: BGL <60 or >400  = CALL MD; 60-200 = 0 units; 201-250 = 2 units; 251-300 = 4 units; 301-350 = 6 units; 351-400 = 8 units; 401-450 = 10 units; >450 = 12 units 07/31/19  Yes Annita Brod, MD  lactose free nutrition (BOOST) LIQD Take 237 mLs by mouth 2 (two) times daily between meals.   Yes [provider]  LANTUS SOLOSTAR 100 UNIT/ML Solostar Pen Inject 28-38 Units into the skin 2 (two) times daily. Inject 38 units in the morning and Inject 28 units at bedtime 02/02/22  Yes [provider]  lidocaine 4 % Place 1 patch onto the skin daily.   Yes [provider]  lidocaine-prilocaine (EMLA) cream Apply 1 Application topically as needed. Patient taking differently: Apply 1 Application topically as needed (access port). 01/16/22  Yes Thayil,  Joyice Faster, PA-C  lisinopril (ZESTRIL) 5 MG tablet Take 5 mg by mouth daily.   Yes [provider]  loperamide (IMODIUM) 2 MG capsule Take 1 capsule (2 mg total) by mouth as needed for diarrhea or loose stools. 09/09/19  Yes Pokhrel, Laxman, MD  loratadine (CLARITIN) 10 MG tablet Take 10 mg by mouth daily.   Yes [provider]  mupirocin ointment (BACTROBAN) 2 % Apply 1 Application topically 2 (two) times daily.   Yes [provider]  Pollen Extracts (PROSTAT PO) Take 30 mLs by mouth in the morning and at bedtime.   Yes [provider]  Polyethyl Glycol-Propyl Glycol (SYSTANE  ULTRA) 0.4-0.3 % SOLN Place 2 drops into both eyes in the morning and at bedtime.   Yes [provider]  polyethylene glycol (MIRALAX / GLYCOLAX) 17 g packet Take 17 g by mouth daily as needed for mild constipation.   Yes [provider]  prochlorperazine (COMPAZINE) 10 MG tablet Take 1 tablet (10 mg total) by mouth every 6 (six) hours as needed for nausea or vomiting. 01/16/22  Yes Thayil, Irene T, PA-C  Simethicone (GAS-X ULTRA STRENGTH) 180 MG CAPS Take 1 capsule by mouth every 8 (eight) hours as needed (gas).   Yes [provider]  Skin Protectants, Misc. (EUCERIN) cream Apply 1 Application topically 2 (two) times daily. Where skin is peeling   Yes [provider]  zinc oxide 20 % ointment Apply 1 Application topically See admin instructions. Every shift and as needed (bilateral buttock)   Yes [provider]      Allergies    Emend [aprepitant], Vancomycin, Fosaprepitant, Lasix [furosemide], and Lasix [furosemide]    Review of Systems   Review of Systems  Constitutional:  Positive for activity change and fever.  HENT:  Negative for sore throat.   Eyes:  Negative for pain and visual disturbance.  Respiratory:  Negative for cough and shortness of breath.   Gastrointestinal:  Negative for abdominal pain and vomiting.  Musculoskeletal:  Negative for arthralgias and back pain.  Skin:  Positive for wound. Negative for color change.  Psychiatric/Behavioral:  Positive for confusion and decreased concentration.   All other systems reviewed and are negative.   Physical Exam Updated Vital Signs BP (!) 139/56   Pulse (!) 102   Temp 99.8 F (37.7 C) (Oral)   Resp (!) 21   SpO2 95%  Physical Exam Vitals and nursing note reviewed.  Constitutional:      Appearance: She is well-developed. She is ill-appearing and toxic-appearing.  HENT:     Head: Normocephalic and atraumatic.     Nose: Nose normal.     Mouth/Throat:     Mouth: Mucous membranes  are dry.  Eyes:     Conjunctiva/sclera: Conjunctivae normal.  Cardiovascular:     Rate and Rhythm: Normal rate and regular rhythm.     Heart sounds: No murmur heard. Pulmonary:     Effort: Pulmonary effort is normal. No respiratory distress.     Breath sounds: Normal breath sounds.  Abdominal:     General: Bowel sounds are normal. There is no distension.     Palpations: Abdomen is soft.     Tenderness: There is abdominal tenderness. There is no guarding.     Comments: Some grunting on palpation of abdomen  Musculoskeletal:        General: No swelling.     Cervical back: Neck supple.  Skin:    General: Skin is warm and  dry.     Capillary Refill: Capillary refill takes less than 2 seconds.     Findings: Rash present.     Comments: Sloughing erythematous rash on bilateral thighs, 1 13m ulcer with packing present on sacrum, 1 233mulcer with packing on R thigh,   Neurological:     Mental Status: She is disoriented.     Comments: Oriented to self only  Psychiatric:        Mood and Affect: Mood normal.     ED Results / Procedures / Treatments   Labs (all labs ordered are listed, but only abnormal results are displayed) Labs Reviewed  COMPREHENSIVE METABOLIC PANEL - Abnormal; Notable for the following components:      Result Value   CO2 21 (*)    Glucose, Bld 252 (*)    BUN 27 (*)    Creatinine, Ser 1.13 (*)    Calcium 7.7 (*)    Total Protein 6.0 (*)    Albumin 2.4 (*)    Total Bilirubin 1.4 (*)    GFR, Estimated 51 (*)    All other components within normal limits  CBC WITH DIFFERENTIAL/PLATELET - Abnormal; Notable for the following components:   WBC 0.3 (*)    RBC 2.13 (*)    Hemoglobin 6.5 (*)    HCT 19.5 (*)    Platelets 42 (*)    Neutro Abs 0.0 (*)    Lymphs Abs 0.2 (*)    Monocytes Absolute 0.0 (*)    All other components within normal limits  PROTIME-INR - Abnormal; Notable for the following components:   Prothrombin Time 17.1 (*)    INR 1.4 (*)    All other  components within normal limits  URINALYSIS, ROUTINE W REFLEX MICROSCOPIC - Abnormal; Notable for the following components:   Color, Urine AMBER (*)    Glucose, UA 50 (*)    Protein, ur 100 (*)    All other components within normal limits  RESP PANEL BY RT-PCR (RSV, FLU A&B, COVID)  RVPGX2  CULTURE, BLOOD (ROUTINE X 2)  CULTURE, BLOOD (ROUTINE X 2)  URINE CULTURE  LACTIC ACID, PLASMA  APTT  PREPARE RBC (CROSSMATCH)  TYPE AND SCREEN    EKG EKG Interpretation  Date/Time:  Thursday February 23 2022 12:31:52 EST Ventricular Rate:  91 PR Interval:  148 QRS Duration: 80 QT Interval:  398 QTC Calculation: 490 R Axis:   35 Text Interpretation: Sinus rhythm Low voltage, precordial leads Borderline T abnormalities, anterior leads flipped t waves in inferior and lateral leads resolved poor baseline TECHNICALLY DIFFICULT Otherwise no significant change Confirmed by FlDeno [id number] 02/23/2022 1:06:45 PM  Radiology CT ABDOMEN PELVIS W CONTRAST  Result Date: 02/23/2022 CLINICAL DATA:  Hodgkin's lymphoma. Sacral wound, fever, hypoxia and lethargy. * Tracking Code: BO * EXAM: CT ABDOMEN AND PELVIS WITH CONTRAST TECHNIQUE: Multidetector CT imaging of the abdomen and pelvis was performed using the standard protocol following bolus administration of intravenous contrast. RADIATION DOSE REDUCTION: This exam was performed according to the departmental dose-optimization program which includes automated exposure control, adjustment of the mA and/or kV according to patient size and/or use of iterative reconstruction technique. CONTRAST:  100107mMNIPAQUE IOHEXOL 300 MG/ML  SOLN COMPARISON:  02/10/2022 CT scan FINDINGS: Lower chest: Mild linear subsegmental atelectasis in the right lower lobe. Stable focal linear scarring in the lingula. Descending thoracic aortic atherosclerosis. Hepatobiliary: Borderline distention of the gallbladder. Small gallstones are present dependently in the gallbladder. Pancreas:  Unremarkable Spleen: There is  a anterolateral band of hypodensity extending in the spleen on images 20-25 of series 2 which is new compared to prior exams. Possibilities include heterogeneity related to early contrast phase, a small splenic infarct, a lymphoma related lesion with atypical morphology, or less likely a small splenic laceration. No perisplenic ascites to further suggest recent splenic trauma. Adrenals/Urinary Tract: The urinary bladder extends 1.4 cm below the pubococcygeal line, compatible with pelvic floor laxity and cystocele. The kidneys and adrenal glands appear unremarkable. Stomach/Bowel: Sigmoid colon diverticulosis. Otherwise unremarkable. Vascular/Lymphatic: Atherosclerosis is present, including aortoiliac atherosclerotic disease. Atheromatous plaque at the origins of the celiac trunk and SMA may be causing some degree of stenosis, but both vessels opacify and accordingly no overt occlusion is identified. There is atheromatous plaque proximally in both renal arteries. Retroperitoneal adenopathy is present, index left periaortic lymph node 1.5 cm in short axis on image 41 series 2, formerly the same. Overall no substantive change in the visualized adenopathy. Reproductive: No significant findings Other: No supplemental non-categorized findings. Musculoskeletal: Dextroconvex lumbar scoliosis with rotary component. Sclerotic Schmorl's nodes and degenerative endplate findings at several lumbar levels. Right eccentric vertical narrowing of the L5 vertebral body as before. Multilevel lumbar impingement due to spondylosis and degenerative disc disease. Focal band of subcutaneous edema extends from the left buttock towards the left posterior iliac bone on image 60 of series 2, with associated overlying cutaneous thickening. This has increased substantially since 02/10/2022 and likely rep reflects the reported decubitus ulcer. Although this extends faintly towards the periosteal margin, we do not  demonstrate a deep gas-filled cavity, and there is no bony demineralization or evidence of underlying bony involvement to suggest osteomyelitis. Along the top of the gluteal cleft, there is also a suspected ulceration or lesion extending down towards the sacrum on images 66 through 72 of series 2, increased from prior. Although this abuts the posterior sacral margin there are no findings of bony demineralization or osteomyelitis. Small radiodensity along the associated wound may represent bandaging. No drainable abscess observed. IMPRESSION: 1. Decubitus ulcers along the left buttock extending towards the left posterior iliac bone, and along the upper margin of the gluteal cleft. Both have edema extending towards the adjacent bony structures but no findings of osteomyelitis or separate drainable abscess. 2. New band of hypodensity extending in the spleen, differential diagnostic considerations include small splenic infarct, heterogeneous enhancement related to early contrast phase, lymphoma related lesion with atypical morphology, or less likely a small splenic laceration. No perisplenic ascites to further suggest recent splenic trauma. 3. Stable retroperitoneal adenopathy. 4. Pelvic floor laxity and cystocele. 5. Sigmoid colon diverticulosis. 6. Cholelithiasis. 7. Atheromatous plaque at the origins of the celiac trunk and SMA may be causing some degree of stenosis, but both vessels opacify and accordingly no overt occlusion is identified. 8. Dextroconvex lumbar scoliosis with rotary component. Multilevel lumbar impingement due to spondylosis and degenerative disc disease. 9. Aortic atherosclerosis. Aortic Atherosclerosis (ICD10-I70.0). Electronically Signed   By: Van Clines M.D.   On: 02/23/2022 15:03   DG Chest Port 1 View  Result Date: 02/23/2022 CLINICAL DATA:  Questionable sepsis - evaluate for abnormality EXAM: PORTABLE CHEST 1 VIEW COMPARISON:  February 10, 2022 FINDINGS: The cardiomediastinal  silhouette is unchanged in contour.RIGHT chest port with tip terminating over the superior cavoatrial junction. Atherosclerotic calcifications. No pleural effusion. No pneumothorax. No acute pleuroparenchymal abnormality. IMPRESSION: No acute cardiopulmonary abnormality. Electronically Signed   By: Valentino Saxon M.D.   On: 02/23/2022 13:59    Procedures Procedures  Medications Ordered in ED Medications  lactated ringers infusion (has no administration in time range)  0.9 %  sodium chloride infusion (Manually program via Guardrails IV Fluids) (has no administration in time range)  ceFEPIme (MAXIPIME) 2 g in sodium chloride 0.9 % 100 mL IVPB (0 g Intravenous Stopped 02/23/22 1304)  metroNIDAZOLE (FLAGYL) IVPB 500 mg (0 mg Intravenous Stopped 02/23/22 1335)  lactated ringers bolus 1,000 mL (0 mLs Intravenous Stopped 02/23/22 1504)    And  lactated ringers bolus 1,000 mL (1,000 mLs Intravenous New Bag/Given 02/23/22 1242)    And  lactated ringers bolus 1,000 mL (1,000 mLs Intravenous New Bag/Given 02/23/22 1506)  vancomycin (VANCOREADY) IVPB 2000 mg/400 mL (2,000 mg Intravenous New Bag/Given 02/23/22 1327)  iohexol (OMNIPAQUE) 300 MG/ML solution 100 mL (100 mLs Intravenous Contrast Given 02/23/22 1420)    ED Course/ Medical Decision Making/ A&P                            Medical Decision Making 72 year old female with Hodgkin's lymphoma currently on chemotherapy and radiation here for neutropenic fever alongside lethargy, hypotension and hypoxia. On ABVD q28d per oncology. Last infusion was Day 3, Cycle 2 Udenyca injection given on 02/17/2022 due for next infusion 02/28/2022. Code sepsis called in ED. Blood and urine cultures collected. Given 2 L LR, started on cefepime, flagyl and vancomycin. CBC with 0 neutrophils. Hgb of 6.5, MCV 91.5. 1 units of pRBC transfused. CT Abdomen showed decubitus ulcers on buttock without osteomyelitis or drainable abscess and small splenic infarct. Anemia possibly d/t  chemotherapy vs small splenic infarct. CMP with elevated Tbili, mildly elevated Cr of 1.13 electrolytes wnl. UA without bacteria/nitrites. LA 1.7. Negative covid/flu. CXR without pneumonia/acute abnormalities.  Possible source of infection is sacral and thigh wounds given erythematous skin surrounding area as well as sloughing. Could consider staph scalded skin syndrome based on appearance. Spoke with hospitalist Dr. Marcello Moores who will admit patient, requested head imagine given patient more altered than baseline -CT Head ordered.   Final Clinical Impression(s) / ED Diagnoses Final diagnoses:  None    Rx / DC Orders ED Discharge Orders     None         Gerrit Heck, MD 02/23/22 [place], [place], DO 02/23/22 1710

## 2022-02-23 NOTE — Progress Notes (Signed)
Pharmacy Antibiotic Note  Heather Petty is a 72 y.o. female admitted on 02/23/2022 with sepsis.  Pharmacy has been consulted for Vanco dosing.  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 T2DM, schizophrenia   ID: Sepsis, FN. Possible source is sacral and thigh wounds. - Tmax 101.9, WBC 0.3. Scr 1.13  Vanco 1/18>> Cefepime 1/18>> Flagyl 1/18 in ED  Plan: Has tolerated multiple dose of Vanco since allergy was entered. Vanco 2g IV x 1 in ED Vancomycin 750 mg IV Q 24 hrs. Goal AUC 400-550. Expected AUC: 530 SCr used: 1.13     Temp (24hrs), Avg:100.9 F (38.3 C), Min:99.8 F (37.7 C), Max:101.9 F (38.8 C)  Recent Labs  Lab 02/21/22 1043 02/22/22 1058 02/23/22 1222  WBC  --  0.4* 0.3*  CREATININE 0.98 0.98 1.13*  LATICACIDVEN  --   --  1.7    Estimated Creatinine Clearance: 47.5 mL/min (A) (by C-G formula based on SCr of 1.13 mg/dL (H)).    Allergies  Allergen Reactions   Emend [Aprepitant] Other (See Comments)    Pt experienced shooting back pain at 10/10 when medication was administered the first time. See Hypersensitivity note from 01/17/2022.   Vancomycin Other (See Comments)    Patient becomes hypotensive, lethargic and itchy. Has tolerated it many times.   Fosaprepitant Other (See Comments)    Back pain / hypersensitivity 01/17/2022   Lasix [Furosemide] Other (See Comments)    IV-LASIX ==> Reaction: Burning   Lasix [Furosemide] Other (See Comments)    On MAR    Kylon Philbrook S. Alford Highland, PharmD, BCPS Clinical Staff Pharmacist [email or web address]  Wayland Salinas 02/23/2022 4:29 PM

## 2022-02-24 ENCOUNTER — Inpatient Hospital Stay (HOSPITAL_COMMUNITY): Payer: Medicare Other

## 2022-02-24 DIAGNOSIS — D709 Neutropenia, unspecified: Secondary | ICD-10-CM | POA: Diagnosis not present

## 2022-02-24 DIAGNOSIS — T80218A Other infection due to central venous catheter, initial encounter: Secondary | ICD-10-CM | POA: Diagnosis not present

## 2022-02-24 DIAGNOSIS — R5081 Fever presenting with conditions classified elsewhere: Secondary | ICD-10-CM | POA: Diagnosis not present

## 2022-02-24 LAB — CBC WITH DIFFERENTIAL/PLATELET
Abs Immature Granulocytes: 0.02 10*3/uL (ref 0.00–0.07)
Basophils Absolute: 0 10*3/uL (ref 0.0–0.1)
Basophils Relative: 0 %
Eosinophils Absolute: 0 10*3/uL (ref 0.0–0.5)
Eosinophils Relative: 6 %
HCT: 23.2 % — ABNORMAL LOW (ref 36.0–46.0)
Hemoglobin: 7.8 g/dL — ABNORMAL LOW (ref 12.0–15.0)
Immature Granulocytes: 6 %
Lymphocytes Relative: 64 %
Lymphs Abs: 0.2 10*3/uL — ABNORMAL LOW (ref 0.7–4.0)
MCH: 30.5 pg (ref 26.0–34.0)
MCHC: 33.6 g/dL (ref 30.0–36.0)
MCV: 90.6 fL (ref 80.0–100.0)
Monocytes Absolute: 0 10*3/uL — ABNORMAL LOW (ref 0.1–1.0)
Monocytes Relative: 9 %
Neutro Abs: 0.1 10*3/uL — CL (ref 1.7–7.7)
Neutrophils Relative %: 15 %
Platelets: 42 10*3/uL — ABNORMAL LOW (ref 150–400)
RBC: 2.56 MIL/uL — ABNORMAL LOW (ref 3.87–5.11)
RDW: 14.7 % (ref 11.5–15.5)
WBC: 0.3 10*3/uL — CL (ref 4.0–10.5)
nRBC: 0 % (ref 0.0–0.2)

## 2022-02-24 LAB — COMPREHENSIVE METABOLIC PANEL
ALT: 18 U/L (ref 0–44)
AST: 16 U/L (ref 15–41)
Albumin: 2.1 g/dL — ABNORMAL LOW (ref 3.5–5.0)
Alkaline Phosphatase: 72 U/L (ref 38–126)
Anion gap: 10 (ref 5–15)
BUN: 25 mg/dL — ABNORMAL HIGH (ref 8–23)
CO2: 21 mmol/L — ABNORMAL LOW (ref 22–32)
Calcium: 7.6 mg/dL — ABNORMAL LOW (ref 8.9–10.3)
Chloride: 104 mmol/L (ref 98–111)
Creatinine, Ser: 0.88 mg/dL (ref 0.44–1.00)
GFR, Estimated: >60 mL/min (ref >60)
Glucose, Bld: 212 mg/dL — ABNORMAL HIGH (ref 70–99)
Potassium: 3.6 mmol/L (ref 3.5–5.1)
Sodium: 135 mmol/L (ref 135–145)
Total Bilirubin: 1.5 mg/dL — ABNORMAL HIGH (ref 0.3–1.2)
Total Protein: 5.1 g/dL — ABNORMAL LOW (ref 6.5–8.1)

## 2022-02-24 LAB — CBC
HCT: 22.4 % — ABNORMAL LOW (ref 36.0–46.0)
Hemoglobin: 7.5 g/dL — ABNORMAL LOW (ref 12.0–15.0)
MCH: 30.6 pg (ref 26.0–34.0)
MCHC: 33.5 g/dL (ref 30.0–36.0)
MCV: 91.4 fL (ref 80.0–100.0)
Platelets: 34 10*3/uL — ABNORMAL LOW (ref 150–400)
RBC: 2.45 MIL/uL — ABNORMAL LOW (ref 3.87–5.11)
RDW: 14.6 % (ref 11.5–15.5)
WBC: 0.3 10*3/uL — CL (ref 4.0–10.5)
nRBC: 0 % (ref 0.0–0.2)

## 2022-02-24 LAB — C-REACTIVE PROTEIN: CRP: 37.2 mg/dL — ABNORMAL HIGH (ref <1.0)

## 2022-02-24 LAB — GLUCOSE, CAPILLARY
Glucose-Capillary: 172 mg/dL — ABNORMAL HIGH (ref 70–99)
Glucose-Capillary: 210 mg/dL — ABNORMAL HIGH (ref 70–99)
Glucose-Capillary: 217 mg/dL — ABNORMAL HIGH (ref 70–99)

## 2022-02-24 LAB — URINE CULTURE: Culture: NO GROWTH

## 2022-02-24 LAB — HEMOGLOBIN AND HEMATOCRIT, BLOOD
HCT: 23.2 % — ABNORMAL LOW (ref 36.0–46.0)
Hemoglobin: 7.9 g/dL — ABNORMAL LOW (ref 12.0–15.0)

## 2022-02-24 MED ORDER — SODIUM CHLORIDE 0.9% FLUSH: 10.0000 mL | INTRAVENOUS | Status: DC | PRN

## 2022-02-24 MED ORDER — SODIUM CHLORIDE 0.9% FLUSH
10.0000 mL | Freq: Two times a day (BID) | INTRAVENOUS | Status: DC
  Administered 2022-02-25: 20 mL
  Administered 2022-02-25 – 2022-03-02 (×5): 10 mL

## 2022-02-24 MED ORDER — VANCOMYCIN HCL IN DEXTROSE 1-5 GM/200ML-% IV SOLN
1000.0000 mg | INTRAVENOUS | Status: DC
  Administered 2022-02-24 – 2022-02-26 (×3): 1000 mg via INTRAVENOUS
  Filled 2022-02-24 (×3): qty 200

## 2022-02-24 MED ORDER — INSULIN ASPART 100 UNIT/ML IJ SOLN
0.0000 [IU] | Freq: Three times a day (TID) | INTRAMUSCULAR | Status: DC
  Administered 2022-02-24 – 2022-02-25 (×3): 2 [IU] via SUBCUTANEOUS
  Administered 2022-02-25 – 2022-03-02 (×4): 1 [IU] via SUBCUTANEOUS
  Filled 2022-02-24: qty 0.06

## 2022-02-24 MED ORDER — NYSTATIN 100000 UNIT/ML MT SUSP
5.0000 mL | Freq: Four times a day (QID) | OROMUCOSAL | Status: DC
  Administered 2022-02-24 – 2022-03-02 (×22): 500000 [IU] via ORAL
  Filled 2022-02-24 (×19): qty 5

## 2022-02-24 MED ORDER — LIP MEDEX EX OINT
1.0000 | TOPICAL_OINTMENT | CUTANEOUS | Status: DC | PRN
  Administered 2022-02-24: 1 via TOPICAL
  Filled 2022-02-24: qty 7

## 2022-02-24 MED ORDER — LACTATED RINGERS IV SOLN: INTRAVENOUS | Status: DC

## 2022-02-24 MED ORDER — CHLORHEXIDINE GLUCONATE CLOTH 2 % EX PADS
6.0000 | MEDICATED_PAD | Freq: Every day | CUTANEOUS | Status: DC
  Administered 2022-02-24 – 2022-03-01 (×6): 6 via TOPICAL

## 2022-02-24 MED ORDER — SODIUM CHLORIDE 0.9 % IV SOLN
2.0000 g | Freq: Three times a day (TID) | INTRAVENOUS | Status: DC
  Administered 2022-02-24 – 2022-02-27 (×9): 2 g via INTRAVENOUS
  Filled 2022-02-24 (×9): qty 12.5

## 2022-02-24 NOTE — Progress Notes (Signed)
Pharmacy Antibiotic Note  Heather Petty is a 72 y.o. female admitted on 02/23/2022 with febrile neutropenia.  Pharmacy has been consulted for vancomycin dosing.  Plan: Adjust vancomycin to 1g IV q24h for estimated AUC 511 using SCr 0.88, Vd 0.5 Check vancomycin levels at steady state, goal AUC 400-550 Adjust Cefepime to 2g IV q8h Flagyl '500mg'$  IV q12h Follow up renal function & cultures    Temp (24hrs), Avg:99.5 F (37.5 C), Min:98.3 F (36.8 C), Max:101.6 F (38.7 C)  Recent Labs  Lab 02/21/22 1043 02/22/22 1058 02/23/22 1222 02/23/22 1915 02/23/22 2100 02/24/22 0458 02/24/22 1045  WBC  --  0.4* 0.3*  --   --  0.3* 0.3*  CREATININE 0.98 0.98 1.13*  --   --  0.88  --   LATICACIDVEN  --   --  1.7 1.9 1.5  --   --     Estimated Creatinine Clearance: 60.9 mL/min (by C-G formula based on SCr of 0.88 mg/dL).    Allergies  Allergen Reactions   Emend [Aprepitant] Other (See Comments)    Pt experienced shooting back pain at 10/10 when medication was administered the first time. See Hypersensitivity note from 01/17/2022.   Vancomycin Other (See Comments)    Patient becomes hypotensive, lethargic and itchy. Has tolerated it many times.   Fosaprepitant Other (See Comments)    Back pain / hypersensitivity 01/17/2022   Lasix [Furosemide] Other (See Comments)    IV-LASIX ==> Reaction: Burning   Lasix [Furosemide] Other (See Comments)    On MAR    Antimicrobials this admission: 1/18 Vanc >> 1/18 Cefepime >> 1/18 Flagyl >>  Dose adjustments this admission:  Microbiology results: 1/18 BCx: ngtd 1/18 PAC: ngtd  Thank you for allowing pharmacy to be a part of this patient's care.  Peggyann Juba, PharmD, BCPS Pharmacy: [phone] 02/24/2022 12:46 PM

## 2022-02-24 NOTE — Progress Notes (Signed)
Hematology/Oncology Progress Note  Clinical Summary: Heather Petty is a 72 year old female with medical history significant for Hodgkin's lymphoma currently undergoing treatment with AVD chemotherapy who presents with neutropenic fever.  Interval History: -- Patient transferred to the emergency department on 02/23/2022 with temperature of 103.6 while saturating 86% on room air. -- Chest x-ray showed no evidence of infectious process, however patient was noted to have decubitus ulcers along the left buttock extending towards left posterior iliac bone.  This may be the nidus of infection. -- Labs today show white blood cell count 0.3, hemoglobin 7.8, MCV 90.6, and platelets of 42 with an ANC of 100. -- Patient notes that she is having difficulty eating due to lesions on her tongue and is having pain due to decubitus ulcers.  O:  Vitals:   02/24/22 1023 02/24/22 1157  BP: (!) 126/50 (!) 116/52  Pulse: 93 86  Resp: 19 (!) 24  Temp:  99.5 F (37.5 C)  SpO2: 99% 94%      Latest Ref Rng & Units 02/24/2022    4:58 AM 02/23/2022   12:22 PM 02/22/2022   10:58 AM  CMP  Glucose 70 - 99 mg/dL 212  252  221   BUN 8 - 23 mg/dL '25  27  23   '$ Creatinine 0.44 - 1.00 mg/dL 0.88  1.13  0.98   Sodium 135 - 145 mmol/L 135  135  132   Potassium 3.5 - 5.1 mmol/L 3.6  4.0  4.1   Chloride 98 - 111 mmol/L 104  105  100   CO2 22 - 32 mmol/L '21  21  23   '$ Calcium 8.9 - 10.3 mg/dL 7.6  7.7  8.8   Total Protein 6.5 - 8.1 g/dL 5.1  6.0  6.2   Total Bilirubin 0.3 - 1.2 mg/dL 1.5  1.4  1.3   Alkaline Phos 38 - 126 U/L 72  79  129   AST 15 - 41 U/L '16  16  13   '$ ALT 0 - 44 U/L '18  18  21       '$ Latest Ref Rng & Units 02/24/2022   10:45 AM 02/24/2022    4:58 AM 02/23/2022    9:00 PM  CBC  WBC 4.0 - 10.5 K/uL 0.3  0.3    Hemoglobin 12.0 - 15.0 g/dL 12.0 - 15.0 g/dL 7.9    7.8  7.5  7.1   Hematocrit 36.0 - 46.0 % 36.0 - 46.0 % 23.2    23.2  22.4  21.4   Platelets 150 - 400 K/uL 42  34         GENERAL: Chronically ill-appearing elderly female, in NAD  SKIN: skin color, texture, turgor are normal, no rashes or significant lesions EYES: conjunctiva are pink and non-injected, sclera clear LUNGS: clear to auscultation and percussion with normal breathing effort HEART: regular rate & rhythm and no murmurs and no lower extremity edema Musculoskeletal: no cyanosis of digits and no clubbing  PSYCH: alert & oriented x 3, fluent speech NEURO: no focal motor/sensory deficits  Assessment/Plan:  [name] is a 72 year old female with medical history significant for Hodgkin's lymphoma currently undergoing treatment with AVD chemotherapy who presents with neutropenic fever.  #Neutropenic Fever --patient has received long-acting G-CSF (pegfilgrastim) on 02/17/2022.  No indication for Granix at this time.  Will need to await counts to arise from prior G-CSF administration.  Target ANC greater than 1.5. --continue cefepime 2 g q 8H until [place] >1.5  and patient is afebrile. Antibiotics per primary team.  --follow up culture data to assure no bactermia --Oncology will continue to follow.  #Hodgkin's Lymphoma, Stage III --biopsy of abdominal lymph node on 12/27/2021 showed classical Hodgkin's lymphoma.  --Pre-treatment PET scan from [facility] [facility] abdominal adenopathy along with mild hypermetabolism involving mediastinal and bilateral axillary nodes. --Recommend AVD chemotherapy x 2 cycles followed by PET CT scan.  PLAN: -- Will need to add long-acting G-CSF to the patient's regimen given her episode of neutropenic fever.  This is a rare occurrence and Hodgkin's lymphoma treated with AVD. --additionally will need to consider dose adjustment for next cycle.   Ledell Peoples, MD Department of Hematology/Oncology [place] [facility] Phone: [phone] Pager: [phone] Email: [email or web address]

## 2022-02-24 NOTE — Progress Notes (Signed)
PROGRESS NOTE   Heather Petty  YTK:[id number] DOB: 1950-02-22 DOA: 02/23/2022 PCP: Pcp, No  Brief Narrative:  16 y/-o [place]  DM TY 2 reflux-prior stroke, NASH, prior MRSA enterococcal bacteremia 2021, gout, schizophrenia, asthma Relatively recent diagnosis Hodgkin lymphoma follow Dr. Lorenso Courier currently on AVD 6  Recent admit 1518 2024 febrile neutropenia during hospitalization 02/11/1999 09/26/2022-was started on Granix at the time cefepime blood cultures showed no growth urine culture multiple organisms and patient was discharged home Patient returns back 1/19 fever 103 hypoxemia mental status change found to have hemoglobin 6.6 also CT abdomen pelvis showed decubitus ulcers left buttocks through left posterior iliac and possible splenic infarct  Presumed to have a skin source, given unit of blood  Hospital-Problem based course  Febrile neutropenia source unknown, potential thrush versus skin - Follow blood cultures - If no source ? Port-A-Cath related and will defer to oncology but may need to have this removed -At this time feel that this may related to her skin infection - Continue at this time cefepime Flagyl Vanco and narrow as appropriate - Continue fluids at 75 cc/H  ?  Oropharyngeal thrush - Start nystatin swish and swallow given difficulty swallowing and probable aphthous ulcers related to chemo  Hodgkin's lymphoma on AVD Pancytopenia -Last chemo was 12/26 and likely is the cause for the pancytopenia that the patient has - Given 2 units of blood with good response to hemoglobin platelets still low down from baseline of 180s prior to initiation of chemo - Will require probable Granix versus Neulasta as per oncology -I am awaiting a differential from today but she has no neutrophils and I expect this is related to her recent chemo  Hypoxemia - No pneumonia covering broadly desat screen as able  Schizophrenia - Continue Sinemet 125 at bedtime, hydroxyzine 25  twice daily  HTN  -Continue Coreg 6.25 twice daily, amlodipine 10, lisinopril 5  DM TY 2 - Sugars in the 214 range we will add sliding scale type, can add back glipizide once eating  Sacral decubiti - Cleanse as per wound care nurse  NASH cirrhosis gout all stable  DVT prophylaxis: SCD secondary to low counts Code Status: Full Family Communication: None Disposition:  Status is: Inpatient Remains inpatient appropriate because:   Needs treatment   Subjective: Having very painful mouth as well as difficulty with her skin and wounds as documented  Objective: Vitals:   02/24/22 0400 02/24/22 0420 02/24/22 0500 02/24/22 0600  BP: 123/60  116/70 (!) 121/53  Pulse: 88  91 94  Resp: 20  (!) 22 (!) 22  Temp: 98.3 F (36.8 C) 98.3 F (36.8 C)    TempSrc: Oral Oral    SpO2: 100%  100% 100%    Intake/Output Summary (Last 24 hours) at 02/24/2022 0749 Last data filed at 02/24/2022 0453 Gross per 24 hour  Intake 2102.13 ml  Output 400 ml  Net 1702.13 ml   There were no vitals filed for this visit.  Examination:  EOMI NCAT cachectic sparse hair No icterus no pallor Chest is clear S1-S2 no murmur port on right side seems nontender Abdomen obese I tried to examine the wound on her inner thigh but was unable Did not examine lower sacral ulcer No lower extremity edema Skin changes  Data Reviewed: personally reviewed   CBC    Component Value Date/Time   WBC 0.3 (LL) 02/24/2022 0458   RBC 2.45 (L) 02/24/2022 0458   HGB 7.5 (L) 02/24/2022 0458   HGB  6.6 (LL) 02/22/2022 1058   HCT 22.4 (L) 02/24/2022 0458   HCT 25.3 (L) 12/21/2017 0150   PLT 34 (L) 02/24/2022 0458   PLT 75 (L) 02/22/2022 1058   MCV 91.4 02/24/2022 0458   MCH 30.6 02/24/2022 0458   MCHC 33.5 02/24/2022 0458   RDW 14.6 02/24/2022 0458   LYMPHSABS 0.2 (L) 02/23/2022 1222   MONOABS 0.0 (L) 02/23/2022 1222   EOSABS 0.0 02/23/2022 1222   BASOSABS 0.0 02/23/2022 1222      Latest Ref Rng & Units  02/24/2022    4:58 AM 02/23/2022   12:22 PM 02/22/2022   10:58 AM  CMP  Glucose 70 - 99 mg/dL 212  252  221   BUN 8 - 23 mg/dL '25  27  23   '$ Creatinine 0.44 - 1.00 mg/dL 0.88  1.13  0.98   Sodium 135 - 145 mmol/L 135  135  132   Potassium 3.5 - 5.1 mmol/L 3.6  4.0  4.1   Chloride 98 - 111 mmol/L 104  105  100   CO2 22 - 32 mmol/L '21  21  23   '$ Calcium 8.9 - 10.3 mg/dL 7.6  7.7  8.8   Total Protein 6.5 - 8.1 g/dL 5.1  6.0  6.2   Total Bilirubin 0.3 - 1.2 mg/dL 1.5  1.4  1.3   Alkaline Phos 38 - 126 U/L 72  79  129   AST 15 - 41 U/L '16  16  13   '$ ALT 0 - 44 U/L '18  18  21      '$ Radiology Studies: CT ABDOMEN PELVIS W CONTRAST  Result Date: 02/23/2022 CLINICAL DATA:  Hodgkin's lymphoma. Sacral wound, fever, hypoxia and lethargy. * Tracking Code: BO * EXAM: CT ABDOMEN AND PELVIS WITH CONTRAST TECHNIQUE: Multidetector CT imaging of the abdomen and pelvis was performed using the standard protocol following bolus administration of intravenous contrast. RADIATION DOSE REDUCTION: This exam was performed according to the departmental dose-optimization program which includes automated exposure control, adjustment of the mA and/or kV according to patient size and/or use of iterative reconstruction technique. CONTRAST:  137m OMNIPAQUE IOHEXOL 300 MG/ML  SOLN COMPARISON:  02/10/2022 CT scan FINDINGS: Lower chest: Mild linear subsegmental atelectasis in the right lower lobe. Stable focal linear scarring in the lingula. Descending thoracic aortic atherosclerosis. Hepatobiliary: Borderline distention of the gallbladder. Small gallstones are present dependently in the gallbladder. Pancreas: Unremarkable Spleen: There is a anterolateral band of hypodensity extending in the spleen on images 20-25 of series 2 which is new compared to prior exams. Possibilities include heterogeneity related to early contrast phase, a small splenic infarct, a lymphoma related lesion with atypical morphology, or less likely a small splenic  laceration. No perisplenic ascites to further suggest recent splenic trauma. Adrenals/Urinary Tract: The urinary bladder extends 1.4 cm below the pubococcygeal line, compatible with pelvic floor laxity and cystocele. The kidneys and adrenal glands appear unremarkable. Stomach/Bowel: Sigmoid colon diverticulosis. Otherwise unremarkable. Vascular/Lymphatic: Atherosclerosis is present, including aortoiliac atherosclerotic disease. Atheromatous plaque at the origins of the celiac trunk and SMA may be causing some degree of stenosis, but both vessels opacify and accordingly no overt occlusion is identified. There is atheromatous plaque proximally in both renal arteries. Retroperitoneal adenopathy is present, index left periaortic lymph node 1.5 cm in short axis on image 41 series 2, formerly the same. Overall no substantive change in the visualized adenopathy. Reproductive: No significant findings Other: No supplemental non-categorized findings. Musculoskeletal: Dextroconvex lumbar scoliosis with rotary  component. Sclerotic Schmorl's nodes and degenerative endplate findings at several lumbar levels. Right eccentric vertical narrowing of the L5 vertebral body as before. Multilevel lumbar impingement due to spondylosis and degenerative disc disease. Focal band of subcutaneous edema extends from the left buttock towards the left posterior iliac bone on image 60 of series 2, with associated overlying cutaneous thickening. This has increased substantially since 02/10/2022 and likely rep reflects the reported decubitus ulcer. Although this extends faintly towards the periosteal margin, we do not demonstrate a deep gas-filled cavity, and there is no bony demineralization or evidence of underlying bony involvement to suggest osteomyelitis. Along the top of the gluteal cleft, there is also a suspected ulceration or lesion extending down towards the sacrum on images 66 through 72 of series 2, increased from prior. Although this  abuts the posterior sacral margin there are no findings of bony demineralization or osteomyelitis. Small radiodensity along the associated wound may represent bandaging. No drainable abscess observed. IMPRESSION: 1. Decubitus ulcers along the left buttock extending towards the left posterior iliac bone, and along the upper margin of the gluteal cleft. Both have edema extending towards the adjacent bony structures but no findings of osteomyelitis or separate drainable abscess. 2. New band of hypodensity extending in the spleen, differential diagnostic considerations include small splenic infarct, heterogeneous enhancement related to early contrast phase, lymphoma related lesion with atypical morphology, or less likely a small splenic laceration. No perisplenic ascites to further suggest recent splenic trauma. 3. Stable retroperitoneal adenopathy. 4. Pelvic floor laxity and cystocele. 5. Sigmoid colon diverticulosis. 6. Cholelithiasis. 7. Atheromatous plaque at the origins of the celiac trunk and SMA may be causing some degree of stenosis, but both vessels opacify and accordingly no overt occlusion is identified. 8. Dextroconvex lumbar scoliosis with rotary component. Multilevel lumbar impingement due to spondylosis and degenerative disc disease. 9. Aortic atherosclerosis. Aortic Atherosclerosis (ICD10-I70.0). Electronically Signed   By: Van Clines M.D.   On: 02/23/2022 15:03   DG Chest Port 1 View  Result Date: 02/23/2022 CLINICAL DATA:  Questionable sepsis - evaluate for abnormality EXAM: PORTABLE CHEST 1 VIEW COMPARISON:  February 10, 2022 FINDINGS: The cardiomediastinal silhouette is unchanged in contour.RIGHT chest port with tip terminating over the superior cavoatrial junction. Atherosclerotic calcifications. No pleural effusion. No pneumothorax. No acute pleuroparenchymal abnormality. IMPRESSION: No acute cardiopulmonary abnormality. Electronically Signed   By: Valentino Saxon M.D.   On: 02/23/2022  13:59     Scheduled Meds:  sodium chloride   Intravenous Once   allopurinol  100 mg Oral Daily   amLODipine  10 mg Oral Daily   carvedilol  6.25 mg Oral BID WC   cholecalciferol  2,000 Units Oral Daily   fluticasone  1 spray Each Nare Daily   Gerhardt's butt cream   Topical QID   insulin glargine-yfgn  28 Units Subcutaneous QHS   insulin glargine-yfgn  38 Units Subcutaneous q morning   lactose free nutrition  237 mL Oral BID BM   lisinopril  5 mg Oral Daily   loratadine  10 mg Oral Daily   Continuous Infusions:  ceFEPime (MAXIPIME) IV 2 g (02/23/22 2331)   lactated ringers 150 mL/hr at 02/24/22 0527   metronidazole 500 mg (02/24/22 0004)   vancomycin       LOS: 1 day   Time spent: [place], MD [facility] To contact the attending provider between 7A-7P or the covering provider during after hours 7P-7A, please log into the web site [email or web address] and access  using universal [place] password for that web site. If you do not have the password, please call the hospital operator.  02/24/2022, 7:49 AM

## 2022-02-25 DIAGNOSIS — R5081 Fever presenting with conditions classified elsewhere: Secondary | ICD-10-CM | POA: Diagnosis not present

## 2022-02-25 DIAGNOSIS — T80218A Other infection due to central venous catheter, initial encounter: Secondary | ICD-10-CM | POA: Diagnosis not present

## 2022-02-25 DIAGNOSIS — D709 Neutropenia, unspecified: Secondary | ICD-10-CM | POA: Diagnosis not present

## 2022-02-25 LAB — CBC WITH DIFFERENTIAL/PLATELET
Abs Immature Granulocytes: 0 10*3/uL (ref 0.00–0.07)
Basophils Absolute: 0 10*3/uL (ref 0.0–0.1)
Basophils Relative: 2 %
Eosinophils Absolute: 0 10*3/uL (ref 0.0–0.5)
Eosinophils Relative: 7 %
HCT: 22.3 % — ABNORMAL LOW (ref 36.0–46.0)
Hemoglobin: 7.3 g/dL — ABNORMAL LOW (ref 12.0–15.0)
Immature Granulocytes: 0 %
Lymphocytes Relative: 48 %
Lymphs Abs: 0.3 10*3/uL — ABNORMAL LOW (ref 0.7–4.0)
MCH: 29.8 pg (ref 26.0–34.0)
MCHC: 32.7 g/dL (ref 30.0–36.0)
MCV: 91 fL (ref 80.0–100.0)
Monocytes Absolute: 0.1 10*3/uL (ref 0.1–1.0)
Monocytes Relative: 20 %
Neutro Abs: 0.1 10*3/uL — CL (ref 1.7–7.7)
Neutrophils Relative %: 23 %
Platelets: 63 10*3/uL — ABNORMAL LOW (ref 150–400)
RBC: 2.45 MIL/uL — ABNORMAL LOW (ref 3.87–5.11)
RDW: 14.7 % (ref 11.5–15.5)
WBC: 0.6 10*3/uL — CL (ref 4.0–10.5)
nRBC: 0 % (ref 0.0–0.2)

## 2022-02-25 LAB — CBC
HCT: 20.2 % — ABNORMAL LOW (ref 36.0–46.0)
Hemoglobin: 6.6 g/dL — CL (ref 12.0–15.0)
MCH: 29.7 pg (ref 26.0–34.0)
MCHC: 32.7 g/dL (ref 30.0–36.0)
MCV: 91 fL (ref 80.0–100.0)
Platelets: 47 10*3/uL — ABNORMAL LOW (ref 150–400)
RBC: 2.22 MIL/uL — ABNORMAL LOW (ref 3.87–5.11)
RDW: 14.9 % (ref 11.5–15.5)
WBC: 0.5 10*3/uL — CL (ref 4.0–10.5)
nRBC: 0 % (ref 0.0–0.2)

## 2022-02-25 LAB — COMPREHENSIVE METABOLIC PANEL
ALT: 32 U/L (ref 0–44)
AST: 37 U/L (ref 15–41)
Albumin: 2 g/dL — ABNORMAL LOW (ref 3.5–5.0)
Alkaline Phosphatase: 93 U/L (ref 38–126)
Anion gap: 7 (ref 5–15)
BUN: 30 mg/dL — ABNORMAL HIGH (ref 8–23)
CO2: 23 mmol/L (ref 22–32)
Calcium: 7.7 mg/dL — ABNORMAL LOW (ref 8.9–10.3)
Chloride: 105 mmol/L (ref 98–111)
Creatinine, Ser: 0.94 mg/dL (ref 0.44–1.00)
GFR, Estimated: >60 mL/min (ref >60)
Glucose, Bld: 191 mg/dL — ABNORMAL HIGH (ref 70–99)
Potassium: 3.1 mmol/L — ABNORMAL LOW (ref 3.5–5.1)
Sodium: 135 mmol/L (ref 135–145)
Total Bilirubin: 1.2 mg/dL (ref 0.3–1.2)
Total Protein: 5 g/dL — ABNORMAL LOW (ref 6.5–8.1)

## 2022-02-25 LAB — GLUCOSE, CAPILLARY
Glucose-Capillary: 156 mg/dL — ABNORMAL HIGH (ref 70–99)
Glucose-Capillary: 158 mg/dL — ABNORMAL HIGH (ref 70–99)
Glucose-Capillary: 203 mg/dL — ABNORMAL HIGH (ref 70–99)
Glucose-Capillary: 160 mg/dL — ABNORMAL HIGH (ref 70–99)

## 2022-02-25 LAB — PREPARE RBC (CROSSMATCH)

## 2022-02-25 MED ORDER — MICONAZOLE NITRATE POWD
Freq: Two times a day (BID) | Status: DC
  Administered 2022-02-25 – 2022-03-02 (×2): 1 via TOPICAL

## 2022-02-25 MED ORDER — DIPHENHYDRAMINE HCL 50 MG/ML IJ SOLN
25.0000 mg | Freq: Once | INTRAMUSCULAR | Status: AC
  Administered 2022-02-25: 25 mg via INTRAVENOUS
  Filled 2022-02-25: qty 1

## 2022-02-25 MED ORDER — ACETAMINOPHEN 325 MG PO TABS
650.0000 mg | ORAL_TABLET | Freq: Once | ORAL | Status: AC
  Administered 2022-02-25: 650 mg via ORAL
  Filled 2022-02-25: qty 2

## 2022-02-25 MED ORDER — FUROSEMIDE 10 MG/ML IJ SOLN
20.0000 mg | Freq: Once | INTRAMUSCULAR | Status: AC
  Administered 2022-02-25: 20 mg via INTRAVENOUS
  Filled 2022-02-25: qty 2

## 2022-02-25 MED ORDER — SODIUM CHLORIDE 0.9% IV SOLUTION: Freq: Once | INTRAVENOUS | Status: DC

## 2022-02-25 NOTE — Progress Notes (Signed)
PROGRESS NOTE   Heather Petty  FXT:[id number] DOB: 1951/01/24 DOA: 02/23/2022 PCP: Pcp, No  Brief Narrative:  63 y/-o [place]  DM TY 2 reflux-prior stroke, NASH, prior MRSA enterococcal bacteremia 2021, gout, schizophrenia, asthma Relatively recent diagnosis Hodgkin lymphoma follow Dr. Lorenso Courier currently on AVD 6  Recent admit 1518 2024 febrile neutropenia during hospitalization 02/11/1999 09/26/2022-was started on Granix at the time cefepime blood cultures showed no growth urine culture multiple organisms and patient was discharged home Patient returns back 1/19 fever 103 hypoxemia mental status change found to have hemoglobin 6.6 also CT abdomen pelvis showed decubitus ulcers left buttocks through left posterior iliac and possible splenic infarct  Presumed to have a skin source, given unit of blood  Hospital-Problem based course  Febrile neutropenia source 2/2 skin excoriations - Blood cultures negative so far but likely source is her skin infection, this has been a long-term issue - Continue at this time cefepime and vancomycin but discontinue Flagyl- Continue fluids at 75 cc/H -patient encouraged to Keep skin dry, will continue nystatin as below and will need miconazole powder over the areas and to keep from getting moist  ?  Oropharyngeal thrush - Start nystatin swish and swallow given difficulty swallowing and probable aphthous ulcers related to chemo  Hodgkin's lymphoma on AVD Pancytopenia -Last chemo was 12/26 and likely is the cause for the pancytopenia that the patient has - Given 2 units of blood 1/19 - Received PEG fill gastrum 1/12 send no need for Granix at this time defer to oncology, they are recommending target ANC of 1500  Hypoxemia - No pneumonia, potentially related to habitus monitor trends  Schizophrenia - Continue Sinemet 125 at bedtime, hydroxyzine 25 twice daily  HTN  -Continue Coreg 6.25 twice daily, amlodipine 10, lisinopril 5  DM TY 2 -  Sugars better controlled in 150-191 range we will add sliding scale type, can add back glipizide once eating  Sacral decubiti - Cleanse as per wound care nurse  NASH cirrhosis gout all stable  DVT prophylaxis: SCD secondary to low counts Code Status: Full Family Communication: None Disposition:  Status is: Inpatient Remains inpatient appropriate because:   Needs treatment   Subjective:  Still in some pain does not feel a whole lot better Has not been really mobile Eating and drinking okay No high-grade fevers overnight No chills or rigors No abdominal pain no diarrhea   Objective: Vitals:   02/24/22 1612 02/24/22 2121 02/25/22 0017 02/25/22 0500  BP: (!) 108/48 (!) 100/45 (!) 102/50 (!) 112/58  Pulse: 88 72 79 78  Resp: 16  (!) 23   Temp: 99.8 F (37.7 C) 98.4 F (36.9 C) 98.9 F (37.2 C) 97.8 F (36.6 C)  TempSrc: Oral Oral Oral Oral  SpO2: 100% 100% 97% 98%  Weight:    97.7 kg    Intake/Output Summary (Last 24 hours) at 02/25/2022 1125 Last data filed at 02/25/2022 2992 Gross per 24 hour  Intake 3103.62 ml  Output --  Net 3103.62 ml    Filed Weights   02/25/22 0500  Weight: 97.7 kg    Examination:  EOMI NCAT cachectic sparse hair No icterus no pallor Chest is clear S1-S2 no murmur port on right side seems nontender Abdomen obese Lower back and buttocks perineal area examined in presence of chaperone and quite excoriated raw appearing and moist  Data Reviewed: personally reviewed   CBC    Component Value Date/Time   WBC 0.6 (LL) 02/25/2022 0935   RBC 2.45 (  L) 02/25/2022 0935   HGB 7.3 (L) 02/25/2022 0935   HGB 6.6 (LL) 02/22/2022 1058   HCT 22.3 (L) 02/25/2022 0935   HCT 25.3 (L) 12/21/2017 0150   PLT 63 (L) 02/25/2022 0935   PLT 75 (L) 02/22/2022 1058   MCV 91.0 02/25/2022 0935   MCH 29.8 02/25/2022 0935   MCHC 32.7 02/25/2022 0935   RDW 14.7 02/25/2022 0935   LYMPHSABS 0.3 (L) 02/25/2022 0935   MONOABS 0.1 02/25/2022 0935   EOSABS  0.0 02/25/2022 0935   BASOSABS 0.0 02/25/2022 0935      Latest Ref Rng & Units 02/25/2022    3:14 AM 02/24/2022    4:58 AM 02/23/2022   12:22 PM  CMP  Glucose 70 - 99 mg/dL 191  212  252   BUN 8 - 23 mg/dL '30  25  27   '$ Creatinine 0.44 - 1.00 mg/dL 0.94  0.88  1.13   Sodium 135 - 145 mmol/L 135  135  135   Potassium 3.5 - 5.1 mmol/L 3.1  3.6  4.0   Chloride 98 - 111 mmol/L 105  104  105   CO2 22 - 32 mmol/L '23  21  21   '$ Calcium 8.9 - 10.3 mg/dL 7.7  7.6  7.7   Total Protein 6.5 - 8.1 g/dL 5.0  5.1  6.0   Total Bilirubin 0.3 - 1.2 mg/dL 1.2  1.5  1.4   Alkaline Phos 38 - 126 U/L 93  72  79   AST 15 - 41 U/L 37  16  16   ALT 0 - 44 U/L 32  18  18      Radiology Studies: CT Head Wo Contrast  Result Date: 02/24/2022 CLINICAL DATA:  Mental status change, unknown cause EXAM: CT HEAD WITHOUT CONTRAST TECHNIQUE: Contiguous axial images were obtained from the base of the skull through the vertex without intravenous contrast. RADIATION DOSE REDUCTION: This exam was performed according to the departmental dose-optimization program which includes automated exposure control, adjustment of the mA and/or kV according to patient size and/or use of iterative reconstruction technique. COMPARISON:  CT head June 14, 21. FINDINGS: Brain: No evidence of acute infarction, hemorrhage, hydrocephalus, extra-axial collection or mass lesion/mass effect. Vascular: No hyperdense vessel. Skull: No acute fracture. Sinuses/Orbits: Clear sinuses.  No acute orbital findings. Other: No mastoid effusions. IMPRESSION: No evidence of acute intracranial abnormality. Electronically Signed   By: Margaretha Sheffield M.D.   On: 02/24/2022 14:54   CT ABDOMEN PELVIS W CONTRAST  Result Date: 02/23/2022 CLINICAL DATA:  Hodgkin's lymphoma. Sacral wound, fever, hypoxia and lethargy. * Tracking Code: BO * EXAM: CT ABDOMEN AND PELVIS WITH CONTRAST TECHNIQUE: Multidetector CT imaging of the abdomen and pelvis was performed using the standard  protocol following bolus administration of intravenous contrast. RADIATION DOSE REDUCTION: This exam was performed according to the departmental dose-optimization program which includes automated exposure control, adjustment of the mA and/or kV according to patient size and/or use of iterative reconstruction technique. CONTRAST:  150m OMNIPAQUE IOHEXOL 300 MG/ML  SOLN COMPARISON:  02/10/2022 CT scan FINDINGS: Lower chest: Mild linear subsegmental atelectasis in the right lower lobe. Stable focal linear scarring in the lingula. Descending thoracic aortic atherosclerosis. Hepatobiliary: Borderline distention of the gallbladder. Small gallstones are present dependently in the gallbladder. Pancreas: Unremarkable Spleen: There is a anterolateral band of hypodensity extending in the spleen on images 20-25 of series 2 which is new compared to prior exams. Possibilities include heterogeneity related to early contrast  phase, a small splenic infarct, a lymphoma related lesion with atypical morphology, or less likely a small splenic laceration. No perisplenic ascites to further suggest recent splenic trauma. Adrenals/Urinary Tract: The urinary bladder extends 1.4 cm below the pubococcygeal line, compatible with pelvic floor laxity and cystocele. The kidneys and adrenal glands appear unremarkable. Stomach/Bowel: Sigmoid colon diverticulosis. Otherwise unremarkable. Vascular/Lymphatic: Atherosclerosis is present, including aortoiliac atherosclerotic disease. Atheromatous plaque at the origins of the celiac trunk and SMA may be causing some degree of stenosis, but both vessels opacify and accordingly no overt occlusion is identified. There is atheromatous plaque proximally in both renal arteries. Retroperitoneal adenopathy is present, index left periaortic lymph node 1.5 cm in short axis on image 41 series 2, formerly the same. Overall no substantive change in the visualized adenopathy. Reproductive: No significant findings  Other: No supplemental non-categorized findings. Musculoskeletal: Dextroconvex lumbar scoliosis with rotary component. Sclerotic Schmorl's nodes and degenerative endplate findings at several lumbar levels. Right eccentric vertical narrowing of the L5 vertebral body as before. Multilevel lumbar impingement due to spondylosis and degenerative disc disease. Focal band of subcutaneous edema extends from the left buttock towards the left posterior iliac bone on image 60 of series 2, with associated overlying cutaneous thickening. This has increased substantially since 02/10/2022 and likely rep reflects the reported decubitus ulcer. Although this extends faintly towards the periosteal margin, we do not demonstrate a deep gas-filled cavity, and there is no bony demineralization or evidence of underlying bony involvement to suggest osteomyelitis. Along the top of the gluteal cleft, there is also a suspected ulceration or lesion extending down towards the sacrum on images 66 through 72 of series 2, increased from prior. Although this abuts the posterior sacral margin there are no findings of bony demineralization or osteomyelitis. Small radiodensity along the associated wound may represent bandaging. No drainable abscess observed. IMPRESSION: 1. Decubitus ulcers along the left buttock extending towards the left posterior iliac bone, and along the upper margin of the gluteal cleft. Both have edema extending towards the adjacent bony structures but no findings of osteomyelitis or separate drainable abscess. 2. New band of hypodensity extending in the spleen, differential diagnostic considerations include small splenic infarct, heterogeneous enhancement related to early contrast phase, lymphoma related lesion with atypical morphology, or less likely a small splenic laceration. No perisplenic ascites to further suggest recent splenic trauma. 3. Stable retroperitoneal adenopathy. 4. Pelvic floor laxity and cystocele. 5. Sigmoid  colon diverticulosis. 6. Cholelithiasis. 7. Atheromatous plaque at the origins of the celiac trunk and SMA may be causing some degree of stenosis, but both vessels opacify and accordingly no overt occlusion is identified. 8. Dextroconvex lumbar scoliosis with rotary component. Multilevel lumbar impingement due to spondylosis and degenerative disc disease. 9. Aortic atherosclerosis. Aortic Atherosclerosis (ICD10-I70.0). Electronically Signed   By: Van Clines M.D.   On: 02/23/2022 15:03   DG Chest Port 1 View  Result Date: 02/23/2022 CLINICAL DATA:  Questionable sepsis - evaluate for abnormality EXAM: PORTABLE CHEST 1 VIEW COMPARISON:  February 10, 2022 FINDINGS: The cardiomediastinal silhouette is unchanged in contour.RIGHT chest port with tip terminating over the superior cavoatrial junction. Atherosclerotic calcifications. No pleural effusion. No pneumothorax. No acute pleuroparenchymal abnormality. IMPRESSION: No acute cardiopulmonary abnormality. Electronically Signed   By: Valentino Saxon M.D.   On: 02/23/2022 13:59     Scheduled Meds:  sodium chloride   Intravenous Once   sodium chloride   Intravenous Once   allopurinol  100 mg Oral Daily   amLODipine  10  mg Oral Daily   carvedilol  6.25 mg Oral BID WC   Chlorhexidine Gluconate Cloth  6 each Topical Daily   cholecalciferol  2,000 Units Oral Daily   fluticasone  1 spray Each Nare Daily   Gerhardt's butt cream   Topical QID   insulin aspart  0-6 Units Subcutaneous TID WC   insulin glargine-yfgn  28 Units Subcutaneous QHS   insulin glargine-yfgn  38 Units Subcutaneous q morning   lactose free nutrition  237 mL Oral BID BM   lisinopril  5 mg Oral Daily   loratadine  10 mg Oral Daily   nystatin  5 mL Oral QID   sodium chloride flush  10-40 mL Intracatheter Q12H   Continuous Infusions:  ceFEPime (MAXIPIME) IV 2 g (02/25/22 0549)   lactated ringers 75 mL/hr at 02/24/22 2045   metronidazole 500 mg (02/25/22 0039)   vancomycin  1,000 mg (02/24/22 1342)     LOS: 2 days   Time spent: [place], MD [facility] To contact the attending provider between 7A-7P or the covering provider during after hours 7P-7A, please log into the web site [email or web address] and access using universal [place] password for that web site. If you do not have the password, please call the hospital operator.  02/25/2022, 11:25 AM

## 2022-02-25 NOTE — Plan of Care (Signed)
Discussed with patient plan of care for the evening, pain management and pure wick hurting her raw skin with some teach back displayed.  Patient requested bed pan instead but with incontinent stool and urine probably needs a foley insertion especially with her raw skin breakdown.  Problem: Education: Goal: Ability to describe self-care measures that may prevent or decrease complications (Diabetes Survival Skills Education) will improve Outcome: Progressing   Problem: Coping: Goal: Ability to adjust to condition or change in health will improve Outcome: Progressing

## 2022-02-26 ENCOUNTER — Encounter (HOSPITAL_COMMUNITY): Payer: Self-pay | Admitting: Internal Medicine

## 2022-02-26 DIAGNOSIS — T80218A Other infection due to central venous catheter, initial encounter: Secondary | ICD-10-CM | POA: Diagnosis not present

## 2022-02-26 DIAGNOSIS — R5081 Fever presenting with conditions classified elsewhere: Secondary | ICD-10-CM | POA: Diagnosis not present

## 2022-02-26 DIAGNOSIS — D709 Neutropenia, unspecified: Secondary | ICD-10-CM | POA: Diagnosis not present

## 2022-02-26 LAB — CBC
HCT: 24.9 % — ABNORMAL LOW (ref 36.0–46.0)
Hemoglobin: 8.4 g/dL — ABNORMAL LOW (ref 12.0–15.0)
MCH: 30.2 pg (ref 26.0–34.0)
MCHC: 33.7 g/dL (ref 30.0–36.0)
MCV: 89.6 fL (ref 80.0–100.0)
Platelets: 77 10*3/uL — ABNORMAL LOW (ref 150–400)
RBC: 2.78 MIL/uL — ABNORMAL LOW (ref 3.87–5.11)
RDW: 15.3 % (ref 11.5–15.5)
WBC: 1.1 10*3/uL — CL (ref 4.0–10.5)
nRBC: 4.6 % — ABNORMAL HIGH (ref 0.0–0.2)

## 2022-02-26 LAB — COMPREHENSIVE METABOLIC PANEL
ALT: 44 U/L (ref 0–44)
AST: 49 U/L — ABNORMAL HIGH (ref 15–41)
Albumin: 1.9 g/dL — ABNORMAL LOW (ref 3.5–5.0)
Alkaline Phosphatase: 143 U/L — ABNORMAL HIGH (ref 38–126)
Anion gap: 6 (ref 5–15)
BUN: 25 mg/dL — ABNORMAL HIGH (ref 8–23)
CO2: 24 mmol/L (ref 22–32)
Calcium: 7.8 mg/dL — ABNORMAL LOW (ref 8.9–10.3)
Chloride: 109 mmol/L (ref 98–111)
Creatinine, Ser: 0.87 mg/dL (ref 0.44–1.00)
GFR, Estimated: >60 mL/min (ref >60)
Glucose, Bld: 146 mg/dL — ABNORMAL HIGH (ref 70–99)
Potassium: 2.9 mmol/L — ABNORMAL LOW (ref 3.5–5.1)
Sodium: 139 mmol/L (ref 135–145)
Total Bilirubin: 1 mg/dL (ref 0.3–1.2)
Total Protein: 5 g/dL — ABNORMAL LOW (ref 6.5–8.1)

## 2022-02-26 LAB — GLUCOSE, CAPILLARY
Glucose-Capillary: 107 mg/dL — ABNORMAL HIGH (ref 70–99)
Glucose-Capillary: 121 mg/dL — ABNORMAL HIGH (ref 70–99)
Glucose-Capillary: 80 mg/dL (ref 70–99)
Glucose-Capillary: 105 mg/dL — ABNORMAL HIGH (ref 70–99)

## 2022-02-26 MED ORDER — POTASSIUM CHLORIDE CRYS ER 20 MEQ PO TBCR: 40.0000 meq | EXTENDED_RELEASE_TABLET | Freq: Once | ORAL | Status: DC

## 2022-02-26 MED ORDER — POTASSIUM CHLORIDE CRYS ER 20 MEQ PO TBCR
40.0000 meq | EXTENDED_RELEASE_TABLET | Freq: Two times a day (BID) | ORAL | Status: AC
  Administered 2022-02-26 (×2): 40 meq via ORAL
  Filled 2022-02-26 (×2): qty 2

## 2022-02-26 NOTE — Progress Notes (Signed)
PROGRESS NOTE   Heather Petty  ZRA:[id number] DOB: September 20, 1950 DOA: 02/23/2022 PCP: Pcp, No  Brief Narrative:  72 y/o [employer] dwelling  DM TY 2 reflux-prior stroke, NASH, prior MRSA enterococcal bacteremia 2021, gout, schizophrenia, asthma Relatively recent diagnosis Hodgkin lymphoma follow Dr. Lorenso Courier currently on AVD 6  Recent admit 15 thru 1.18.2024 febrile neutropenia --was started on Granix at the time cefepime blood cultures showed no growth urine culture multiple organisms and patient was discharged home  Patient returns back 1/19 fever 103 hypoxemia mental status change found to have hemoglobin 6.6 also CT abdomen pelvis showed decubitus ulcers left buttocks through left posterior iliac and possible splenic infarct  Presumed to have a skin source, given unit of blood  Hospital-Problem based course  Febrile neutropenia source 2/2 skin excoriations - Blood cultures negative so far but likely source is her skin infection, this has been a long-term issue - Continue at this time cefepime and vancomycin but discontinue Flagyl - Continue fluids at 75 cc/H -patient encouraged to Keep skin dry, will continue nystatin as below and will need miconazole powder over the areas and to keep from getting moist -Foley placed to keep skin dry and 8 and healing  ?  Oropharyngeal thrush - Start nystatin swish and swallow given difficulty swallowing and probable aphthous ulcers related to chemo - Do not think systemic therapy is necessary at this time  Moderate to severe hypokalemia -Start K-Dur 40 twice daily - If no improvement check magnesium and add to fluids  Hodgkin's lymphoma on AVD Pancytopenia -Last chemo was 12/26 and likely is the cause for the pancytopenia that the patient has - Given 2 units of blood 1/19 and hemoglobin has resolved - Received PEGfillgastrum 1/12 send no need for Granix at this time per oncology, they are recommending target ANC of 1500  Hypoxemia - No  pneumonia, potentially related to habitus monitor trends  Schizophrenia - Continue Sinemet 125 at bedtime, hydroxyzine 25 twice daily  HTN  -Continue Coreg 6.25 twice daily, amlodipine 10, lisinopril 5  DM TY 2 - Sugars better controlled in 100-150 range - We will add sliding scale type, can add back glipizide once eating - On Semglee 3 8 in the morning 2 8 at night  Sacral decubiti - Cleanse as per wound care nurse, apply Xeroform and folded layer to wound with silicone foam and change daily  NASH cirrhosis gout all stable  DVT prophylaxis: SCD secondary to low counts Code Status: Full Family Communication: None Disposition:  Status is: Inpatient Remains inpatient appropriate because:   Needs treatment and monitoring need ANCs, Will need wound care and can discharge to skilled facility in about 2 to 3 days if all improves   Subjective:  Still in some pain  Foley placed Eating some better no fever no chills   Objective: Vitals:   02/26/22 0337 02/26/22 0530 02/26/22 0600 02/26/22 0850  BP:  (!) 128/54  (!) 129/58  Pulse:  75  74  Resp: 20 (!) 22 19 (!) 24  Temp:  98.1 F (36.7 C)  98.7 F (37.1 C)  TempSrc:  Oral  Oral  SpO2:  97%  100%  Weight: 104.1 kg     Height:        Intake/Output Summary (Last 24 hours) at 02/26/2022 1425 Last data filed at 02/26/2022 0500 Gross per 24 hour  Intake 1855.01 ml  Output 100 ml  Net 1755.01 ml    Filed Weights   02/25/22 0500 02/25/22 1932 02/26/22  6144  Weight: 97.7 kg 104.6 kg 104.1 kg    Examination:  EOMI NCAT cachectic sparse hair No icterus no pallor Chest is clear S1-S2 no murmur seems to be in sinus predominantly  port on right side seems nontender Abdomen obese Did not examine wounds today  Data Reviewed: personally reviewed   CBC    Component Value Date/Time   WBC 1.1 (LL) 02/26/2022 0436   RBC 2.78 (L) 02/26/2022 0436   HGB 8.4 (L) 02/26/2022 0436   HGB 6.6 (LL) 02/22/2022 1058   HCT 24.9  (L) 02/26/2022 0436   HCT 25.3 (L) 12/21/2017 0150   PLT 77 (L) 02/26/2022 0436   PLT 75 (L) 02/22/2022 1058   MCV 89.6 02/26/2022 0436   MCH 30.2 02/26/2022 0436   MCHC 33.7 02/26/2022 0436   RDW 15.3 02/26/2022 0436   LYMPHSABS 0.3 (L) 02/25/2022 0935   MONOABS 0.1 02/25/2022 0935   EOSABS 0.0 02/25/2022 0935   BASOSABS 0.0 02/25/2022 0935      Latest Ref Rng & Units 02/26/2022    4:36 AM 02/25/2022    3:14 AM 02/24/2022    4:58 AM  CMP  Glucose 70 - 99 mg/dL 146  191  212   BUN 8 - 23 mg/dL '25  30  25   '$ Creatinine 0.44 - 1.00 mg/dL 0.87  0.94  0.88   Sodium 135 - 145 mmol/L 139  135  135   Potassium 3.5 - 5.1 mmol/L 2.9  3.1  3.6   Chloride 98 - 111 mmol/L 109  105  104   CO2 22 - 32 mmol/L '24  23  21   '$ Calcium 8.9 - 10.3 mg/dL 7.8  7.7  7.6   Total Protein 6.5 - 8.1 g/dL 5.0  5.0  5.1   Total Bilirubin 0.3 - 1.2 mg/dL 1.0  1.2  1.5   Alkaline Phos 38 - 126 U/L 143  93  72   AST 15 - 41 U/L 49  37  16   ALT 0 - 44 U/L 44  32  18      Radiology Studies: CT Head Wo Contrast  Result Date: 02/24/2022 CLINICAL DATA:  Mental status change, unknown cause EXAM: CT HEAD WITHOUT CONTRAST TECHNIQUE: Contiguous axial images were obtained from the base of the skull through the vertex without intravenous contrast. RADIATION DOSE REDUCTION: This exam was performed according to the departmental dose-optimization program which includes automated exposure control, adjustment of the mA and/or kV according to patient size and/or use of iterative reconstruction technique. COMPARISON:  CT head June 14, 21. FINDINGS: Brain: No evidence of acute infarction, hemorrhage, hydrocephalus, extra-axial collection or mass lesion/mass effect. Vascular: No hyperdense vessel. Skull: No acute fracture. Sinuses/Orbits: Clear sinuses.  No acute orbital findings. Other: No mastoid effusions. IMPRESSION: No evidence of acute intracranial abnormality. Electronically Signed   By: Margaretha Sheffield M.D.   On: 02/24/2022  14:54     Scheduled Meds:  sodium chloride   Intravenous Once   sodium chloride   Intravenous Once   allopurinol  100 mg Oral Daily   amLODipine  10 mg Oral Daily   carvedilol  6.25 mg Oral BID WC   Chlorhexidine Gluconate Cloth  6 each Topical Daily   cholecalciferol  2,000 Units Oral Daily   fluticasone  1 spray Each Nare Daily   Gerhardt's butt cream   Topical QID   insulin aspart  0-6 Units Subcutaneous TID WC   insulin glargine-yfgn  28 Units  Subcutaneous QHS   insulin glargine-yfgn  38 Units Subcutaneous q morning   lactose free nutrition  237 mL Oral BID BM   lisinopril  5 mg Oral Daily   loratadine  10 mg Oral Daily   miconazole nitrate   Topical BID   nystatin  5 mL Oral QID   potassium chloride  40 mEq Oral BID   sodium chloride flush  10-40 mL Intracatheter Q12H   Continuous Infusions:  ceFEPime (MAXIPIME) IV Stopped (02/26/22 0431)   lactated ringers Stopped (02/25/22 1246)   vancomycin 1,000 mg (02/26/22 1421)     LOS: 3 days   Time spent: [place], MD [facility] To contact the attending provider between 7A-7P or the covering provider during after hours 7P-7A, please log into the web site [email or web address] and access using universal [place] password for that web site. If you do not have the password, please call the hospital operator.  02/26/2022, 2:25 PM

## 2022-02-27 DIAGNOSIS — R5081 Fever presenting with conditions classified elsewhere: Secondary | ICD-10-CM | POA: Diagnosis not present

## 2022-02-27 DIAGNOSIS — D709 Neutropenia, unspecified: Secondary | ICD-10-CM | POA: Diagnosis not present

## 2022-02-27 DIAGNOSIS — T80218A Other infection due to central venous catheter, initial encounter: Secondary | ICD-10-CM | POA: Diagnosis not present

## 2022-02-27 LAB — COMPREHENSIVE METABOLIC PANEL
ALT: 12 U/L (ref 0–44)
AST: 16 U/L (ref 15–41)
Albumin: 2 g/dL — ABNORMAL LOW (ref 3.5–5.0)
Alkaline Phosphatase: 60 U/L (ref 38–126)
Anion gap: 5 (ref 5–15)
BUN: 21 mg/dL (ref 8–23)
CO2: 22 mmol/L (ref 22–32)
Calcium: 7.5 mg/dL — ABNORMAL LOW (ref 8.9–10.3)
Chloride: 108 mmol/L (ref 98–111)
Creatinine, Ser: 0.77 mg/dL (ref 0.44–1.00)
GFR, Estimated: >60 mL/min (ref >60)
Glucose, Bld: 96 mg/dL (ref 70–99)
Potassium: 3.8 mmol/L (ref 3.5–5.1)
Sodium: 135 mmol/L (ref 135–145)
Total Bilirubin: 0.2 mg/dL — ABNORMAL LOW (ref 0.3–1.2)
Total Protein: 5.7 g/dL — ABNORMAL LOW (ref 6.5–8.1)

## 2022-02-27 LAB — TYPE AND SCREEN
ABO/RH(D): O NEG
Antibody Screen: NEGATIVE
Unit division: 0
Unit division: 0
Unit division: 0

## 2022-02-27 LAB — CBC WITH DIFFERENTIAL/PLATELET
Abs Immature Granulocytes: 0.03 10*3/uL (ref 0.00–0.07)
Basophils Absolute: 0 10*3/uL (ref 0.0–0.1)
Basophils Relative: 0 %
Eosinophils Absolute: 0.4 10*3/uL (ref 0.0–0.5)
Eosinophils Relative: 6 %
HCT: 24.2 % — ABNORMAL LOW (ref 36.0–46.0)
Hemoglobin: 7.6 g/dL — ABNORMAL LOW (ref 12.0–15.0)
Immature Granulocytes: 0 %
Lymphocytes Relative: 18 %
Lymphs Abs: 1.3 10*3/uL (ref 0.7–4.0)
MCH: 30.4 pg (ref 26.0–34.0)
MCHC: 31.4 g/dL (ref 30.0–36.0)
MCV: 96.8 fL (ref 80.0–100.0)
Monocytes Absolute: 0.7 10*3/uL (ref 0.1–1.0)
Monocytes Relative: 10 %
Neutro Abs: 4.6 10*3/uL (ref 1.7–7.7)
Neutrophils Relative %: 66 %
Platelets: 426 10*3/uL — ABNORMAL HIGH (ref 150–400)
RBC: 2.5 MIL/uL — ABNORMAL LOW (ref 3.87–5.11)
RDW: 19.9 % — ABNORMAL HIGH (ref 11.5–15.5)
WBC: 7.1 10*3/uL (ref 4.0–10.5)
nRBC: 0 % (ref 0.0–0.2)

## 2022-02-27 LAB — GLUCOSE, CAPILLARY
Glucose-Capillary: 52 mg/dL — ABNORMAL LOW (ref 70–99)
Glucose-Capillary: 173 mg/dL — ABNORMAL HIGH (ref 70–99)
Glucose-Capillary: 47 mg/dL — ABNORMAL LOW (ref 70–99)
Glucose-Capillary: 76 mg/dL (ref 70–99)
Glucose-Capillary: 173 mg/dL — ABNORMAL HIGH (ref 70–99)
Glucose-Capillary: 204 mg/dL — ABNORMAL HIGH (ref 70–99)

## 2022-02-27 LAB — BPAM RBC
Blood Product Expiration Date: 202402152359
Blood Product Expiration Date: 202402162359
Blood Product Expiration Date: 202402172359
ISSUE DATE / TIME: 202401181642
ISSUE DATE / TIME: 202401201511
Unit Type and Rh: 9500
Unit Type and Rh: 9500
Unit Type and Rh: 9500

## 2022-02-27 MED ORDER — AMOXICILLIN-POT CLAVULANATE 875-125 MG PO TABS
1.0000 | ORAL_TABLET | Freq: Two times a day (BID) | ORAL | Status: DC
  Administered 2022-02-27 – 2022-02-28 (×3): 1 via ORAL
  Filled 2022-02-27 (×4): qty 1

## 2022-02-27 MED ORDER — DEXTROSE 50 % IV SOLN: 1.0000 | Freq: Once | INTRAVENOUS | Status: AC

## 2022-02-27 MED ORDER — INSULIN GLARGINE-YFGN 100 UNIT/ML ~~LOC~~ SOLN
15.0000 [IU] | Freq: Two times a day (BID) | SUBCUTANEOUS | Status: DC
  Administered 2022-02-27: 15 [IU] via SUBCUTANEOUS
  Filled 2022-02-27 (×3): qty 0.15

## 2022-02-27 MED ORDER — DEXTROSE 50 % IV SOLN
INTRAVENOUS | Status: AC
  Administered 2022-02-27: 50 mL via INTRAVENOUS
  Filled 2022-02-27: qty 50

## 2022-02-27 MED ORDER — CALAMINE EX LOTN
1.0000 | TOPICAL_LOTION | Freq: Two times a day (BID) | CUTANEOUS | Status: DC
  Administered 2022-02-27 – 2022-03-02 (×6): 1 via TOPICAL
  Filled 2022-02-27: qty 177

## 2022-02-27 MED ORDER — TRAMADOL HCL 50 MG PO TABS
50.0000 mg | ORAL_TABLET | Freq: Four times a day (QID) | ORAL | Status: DC | PRN
  Administered 2022-02-27 – 2022-03-01 (×4): 50 mg via ORAL
  Filled 2022-02-27 (×4): qty 1

## 2022-02-27 MED FILL — Dexamethasone Sodium Phosphate Inj 100 MG/10ML: INTRAMUSCULAR | Qty: 1 | Status: AC

## 2022-02-27 NOTE — Progress Notes (Signed)
PROGRESS NOTE   Heather Petty  PYK:[id number] DOB: 1950-06-24 DOA: 02/23/2022 PCP: Pcp, No  Brief Narrative:  72 y/o [place] dwelling  DM TY 2 reflux-prior stroke, NASH, prior MRSA enterococcal bacteremia 2021, gout, schizophrenia, asthma Relatively recent diagnosis Hodgkin lymphoma follow Dr. Lorenso Courier currently on AVD 6  Recent admit 15 thru 1.18.2024 febrile neutropenia --was started on Granix at the time cefepime blood cultures showed no growth urine culture multiple organisms and patient was discharged home  Patient returns back 1/19 fever 103 hypoxemia mental status change found to have hemoglobin 6.6 also CT abdomen pelvis showed decubitus ulcers left buttocks through left posterior iliac and possible splenic infarct  Presumed to have a skin source, given unit of blood  Hospital-Problem based course  Febrile neutropenia source 2/2 skin excoriations - Blood cultures negative, source equals denuded skin - Discontinue cefepime vancomycin placed on Augmentin Discontinue IV fluid -patient encouraged to Keep skin dry, will continue nystatin as below and will need miconazole powder over the areas and to keep from getting moist -Foley placed to keep skin dry  -Placing on tramadol low-dose for skin excoriations and pain-have encouraged patient again to move  ?  Oropharyngeal thrush - Start nystatin swish and swallow given difficulty swallowing and probable aphthous ulcers related to chemo - Do not think systemic therapy is necessary at this time  Moderate to severe hypokalemia -Start K-Dur 40 twice daily - Improved-check again in a.m. but continue replacement  Hodgkin's lymphoma on AVD Pancytopenia -Last chemo was 12/26 and likely is the cause for the pancytopenia that the patient has - Given 2 units of blood 1/19 and hemoglobin has resolved - Received PEGfillgastrum 1/12 send no need for Granix  -ANC to 4600 is improved and infection seems to be on its way to be  better  Hypoxemia - No pneumonia, potentially related to habitus monitor trends  Schizophrenia - Continue Sinemet 125 at bedtime, hydroxyzine 25 twice daily  HTN  -Continue Coreg 6.25 twice daily, amlodipine 10, lisinopril 5  DM TY 2 complication of hypoglycemia - Hypoglycemic on a.m. 1/22-in the setting would not resume glipizide - Cut back long-acting to 15 twice daily, continue sliding scale  Sacral decubiti - Cleanse as per wound care nurse, apply Xeroform and folded layer to wound with silicone foam and change daily - Pain meds as above  NASH cirrhosis gout all stable  DVT prophylaxis: SCD secondary to low counts Code Status: Full Family Communication: None Disposition:  Status is: Inpatient Remains inpatient appropriate because:   ANC is improved likely can DC to skilled tomorrow   Subjective:  Seems to be in pain Not moving has not been out of bed No fever no chills Wounds examined today as per below   Objective: Vitals:   02/26/22 2033 02/27/22 0519 02/27/22 0740 02/27/22 1447  BP: 120/63 (!) 141/62 (!) 164/62 134/86  Pulse: 74 74 84 86  Resp: 19 19 (!) 21 14  Temp: 97.9 F (36.6 C) 98.3 F (36.8 C) 98.6 F (37 C) 97.8 F (36.6 C)  TempSrc: Oral Oral Skin Oral  SpO2: 100% 100% 97% 98%  Weight:  104.8 kg    Height:        Intake/Output Summary (Last 24 hours) at 02/27/2022 1601 Last data filed at 02/27/2022 1529 Gross per 24 hour  Intake 1213 ml  Output 2128 ml  Net -915 ml    Filed Weights   02/25/22 1932 02/26/22 0337 02/27/22 0519  Weight: 104.6 kg 104.1 kg 104.8  kg    Examination:  EOMI NCAT sparse hair poor nutrition Skin denudation Abdomen obese nontender no rebound Skin looks a little less excoriated and less damp on lower back and buttocks she has some caking of miconazole in the area No lower extremity edema  Data Reviewed: personally reviewed   CBC    Component Value Date/Time   WBC 7.1 02/27/2022 0401   RBC 2.50 (L)  02/27/2022 0401   HGB 7.6 (L) 02/27/2022 0401   HGB 6.6 (LL) 02/22/2022 1058   HCT 24.2 (L) 02/27/2022 0401   HCT 25.3 (L) 12/21/2017 0150   PLT 426 (H) 02/27/2022 0401   PLT 75 (L) 02/22/2022 1058   MCV 96.8 02/27/2022 0401   MCH 30.4 02/27/2022 0401   MCHC 31.4 02/27/2022 0401   RDW 19.9 (H) 02/27/2022 0401   LYMPHSABS 1.3 02/27/2022 0401   MONOABS 0.7 02/27/2022 0401   EOSABS 0.4 02/27/2022 0401   BASOSABS 0.0 02/27/2022 0401      Latest Ref Rng & Units 02/27/2022    4:01 AM 02/26/2022    4:36 AM 02/25/2022    3:14 AM  CMP  Glucose 70 - 99 mg/dL 96  146  191   BUN 8 - 23 mg/dL '21  25  30   '$ Creatinine 0.44 - 1.00 mg/dL 0.77  0.87  0.94   Sodium 135 - 145 mmol/L 135  139  135   Potassium 3.5 - 5.1 mmol/L 3.8  2.9  3.1   Chloride 98 - 111 mmol/L 108  109  105   CO2 22 - 32 mmol/L '22  24  23   '$ Calcium 8.9 - 10.3 mg/dL 7.5  7.8  7.7   Total Protein 6.5 - 8.1 g/dL 5.7  5.0  5.0   Total Bilirubin 0.3 - 1.2 mg/dL 0.2  1.0  1.2   Alkaline Phos 38 - 126 U/L 60  143  93   AST 15 - 41 U/L 16  49  37   ALT 0 - 44 U/L 12  44  32      Radiology Studies: No results found.   Scheduled Meds:  sodium chloride   Intravenous Once   sodium chloride   Intravenous Once   allopurinol  100 mg Oral Daily   amLODipine  10 mg Oral Daily   amoxicillin-clavulanate  1 tablet Oral Q12H   carvedilol  6.25 mg Oral BID WC   Chlorhexidine Gluconate Cloth  6 each Topical Daily   cholecalciferol  2,000 Units Oral Daily   fluticasone  1 spray Each Nare Daily   Gerhardt's butt cream   Topical QID   insulin aspart  0-6 Units Subcutaneous TID WC   insulin glargine-yfgn  15 Units Subcutaneous BID   lactose free nutrition  237 mL Oral BID BM   lisinopril  5 mg Oral Daily   loratadine  10 mg Oral Daily   miconazole nitrate   Topical BID   nystatin  5 mL Oral QID   sodium chloride flush  10-40 mL Intracatheter Q12H   Continuous Infusions:  lactated ringers 75 mL/hr at 02/27/22 1529     LOS: 4 days    Time spent: [place], MD [facility] To contact the attending provider between 7A-7P or the covering provider during after hours 7P-7A, please log into the web site [email or web address] and access using universal [place] password for that web site. If you do not have the password, please call the hospital operator.  02/27/2022, 4:01 PM

## 2022-02-27 NOTE — Inpatient Diabetes Management (Signed)
Inpatient Diabetes Program Recommendations  AACE/ADA: New Consensus Statement on Inpatient Glycemic Control (2015)  Target Ranges:  Prepandial:   less than 140 mg/dL      Peak postprandial:   less than 180 mg/dL (1-2 hours)      Critically ill patients:  140 - 180 mg/dL    Latest Reference Range & Units 02/26/22 08:43 02/26/22 11:44 02/26/22 18:22 02/26/22 21:23  Glucose-Capillary 70 - 99 mg/dL 107 (H) 121 (H)  38 units Semglee 80 105 (H)  28 units Semglee  (H): Data is abnormally high  Latest Reference Range & Units 02/27/22 07:28 02/27/22 07:45 02/27/22 07:57  Glucose-Capillary 70 - 99 mg/dL 52 (L) 47 (L) 173 (H)  (L): Data is abnormally low (H): Data is abnormally high    Admit with: Neutropenic Fever/ Decubitus ulcers/ Severe Anemia  History: DM, Schizophrenia, Hodgkin lymphoma currently on chemotherapy   SNF DM Meds: Glipizide 10 mg BID        Novolog 0-12 units QID per SSI        Lantus 38 units AM/ 28 units PM  Current Orders: Semglee 38 units AM/ 28 units PM      Novolog 0-6 units TID    MD- Note Hypoglycemia this AM.  Please consider reducing the Semglee doses by 30%: Semglee 28 units QAM Semglee 20 units QHS    --Will follow patient during hospitalization--  Wyn Quaker RN, MSN, [place] Diabetes Coordinator Inpatient Glycemic Control Team Team Pager: [phone] (8a-5p)

## 2022-02-27 NOTE — TOC Initial Note (Signed)
Transition of Care [facility]) - Initial/Assessment Note    Patient Details  Name: Heather Petty MRN: [id number] Date of Birth: 1950/06/24  Transition of Care [facility]) CM/SW Contact:    Illene Regulus, LCSW Phone Number: 02/27/2022, 2:19 PM  Clinical Narrative:                 Pt is a LTC resident at [facility], and can return when medially stable. TOC to Follow.         Patient Goals and CMS Choice            Expected Discharge Plan and Services                                              Prior Living Arrangements/Services                       Activities of Daily Living Home Assistive Devices/Equipment: Other (Comment) (SNF) ADL Screening (condition at time of admission) Patient's cognitive ability adequate to safely complete daily activities?: Yes Is the patient deaf or have difficulty hearing?: No Does the patient have difficulty seeing, even when wearing glasses/contacts?: Yes Does the patient have difficulty concentrating, remembering, or making decisions?: Yes Patient able to express need for assistance with ADLs?: Yes Does the patient have difficulty dressing or bathing?: Yes Independently performs ADLs?: No Does the patient have difficulty walking or climbing stairs?: Yes Weakness of Legs: Both Weakness of Arms/Hands: Both  Permission Sought/Granted                  Emotional Assessment              Admission diagnosis:  Neutropenic fever ([place]) [D70.9, R50.81] Sepsis with acute hypoxic respiratory failure and septic shock, due to unspecified organism ([place]) [A41.9, R65.21, J96.01] Patient Active Problem List   Diagnosis Date Noted   Neutropenic fever ([place]) 02/23/2022   Port-A-Cath in place 02/15/2022   Febrile neutropenia ([place]) 02/11/2022   Hodgkin lymphoma ([place]) 01/10/2022   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   Candidal intertrigo 09/25/2019   CAP (community acquired pneumonia) 09/03/2019   Uncontrolled  type 2 diabetes mellitus with hyperglycemia ([place]) 09/03/2019   Elevated LFTs 09/03/2019   Essential hypertension 09/03/2019   Gout 09/03/2019   Asthma 09/03/2019   Anemia 09/03/2019   Pneumonia 09/03/2019   Splenomegaly    Anemia 08/05/2019   Diarrhea 08/05/2019   DKA (diabetic ketoacidoses) 07/22/2019   Rhabdomyolysis 40/34/7425   Acute metabolic encephalopathy 95/63/8756   MRSA bacteremia 07/22/2019   Enterococcal bacteremia 07/22/2019   Vitiligo 07/22/2019   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   Sepsis ([place]) 07/19/2018   Ventricular tachycardia, sustained ([place]) 07/19/2018   NSTEMI (non-ST elevated myocardial infarction) ([place]) 07/19/2018   Wound infection 04/03/2018   Morbid obesity with BMI of 50.0-59.9, adult ([place]) 04/03/2018   Elevated alkaline phosphatase level 04/03/2018   Asthma exacerbation 03/14/2018   Hypotension 03/14/2018   Lactic acidosis 03/14/2018   GERD (gastroesophageal reflux disease) 03/14/2018   Acute renal failure superimposed on stage 3 chronic kidney disease ([place]) 03/14/2018   Macrocytic anemia 03/14/2018   Abnormal LFTs 03/14/2018   Acute kidney injury ([place]) 02/28/2018   Wound of sacral region 02/28/2018   Hypothermia 02/28/2018   Stage II pressure ulcer of sacral region ([place]) 02/15/2018   DM II (diabetes mellitus, type II), controlled ([place]) 02/09/2018  Schizophrenia ([place]) 02/09/2018   Decubitus ulcer of right leg 02/06/2018   Acute renal failure (ARF) ([place]) 02/05/2018   Hyperlipidemia associated with type 2 diabetes mellitus ([place]) 02/05/2018   Hyperkalemia 02/05/2018   Dermatitis 02/05/2018   Open back wound 02/05/2018   Dysuria 02/05/2018   Paranoid schizophrenia ([place])    AKI (acute kidney injury) ([place]) 12/17/2017   Type II diabetes mellitus with renal manifestations ([place]) 12/17/2017   HTN (hypertension) 12/17/2017   Gout    PCP:  Pcp, No Pharmacy:   [facility] [place] [facility] [address] [place]  [address] Phone: [phone] Fax: [phone]  [id number] - [place], [place] [address] [place] [address] Phone: [phone] Fax: [phone]     Social Determinants of Health (SDOH) Social History: SDOH Screenings   Food Insecurity: No Food Insecurity (02/26/2022)  Housing: Low Risk  (02/26/2022)  Transportation Needs: No Transportation Needs (02/26/2022)  Utilities: Not At Risk (02/26/2022)  Tobacco Use: Low Risk  (02/26/2022)   SDOH Interventions:     Readmission Risk Interventions     No data to display

## 2022-02-27 NOTE — Plan of Care (Signed)

## 2022-02-27 NOTE — Progress Notes (Signed)
Hematology/Oncology Progress Note  Clinical Summary: [name] is a 72 year old female with medical history significant for Hodgkin's lymphoma currently undergoing treatment with AVD chemotherapy who presents with neutropenic fever.  Interval History: -- Patient is having a lot of pain at her Foley catheter site.  Informed the nurse. --labs today show white blood cell 7.1, hemoglobin 7.6, platelets 426, ANC 4600 -- Patient has remained afebrile with culture data not showing any overt source of infection. -- No barrier to discharge from an oncology standpoint.  O:  Vitals:   02/27/22 0740 02/27/22 1447  BP: (!) 164/62 134/86  Pulse: 84 86  Resp: (!) 21 14  Temp: 98.6 F (37 C) 97.8 F (36.6 C)  SpO2: 97% 98%      Latest Ref Rng & Units 02/27/2022    4:01 AM 02/26/2022    4:36 AM 02/25/2022    3:14 AM  CMP  Glucose 70 - 99 mg/dL 96  146  191   BUN 8 - 23 mg/dL '21  25  30   '$ Creatinine 0.44 - 1.00 mg/dL 0.77  0.87  0.94   Sodium 135 - 145 mmol/L 135  139  135   Potassium 3.5 - 5.1 mmol/L 3.8  2.9  3.1   Chloride 98 - 111 mmol/L 108  109  105   CO2 22 - 32 mmol/L '22  24  23   '$ Calcium 8.9 - 10.3 mg/dL 7.5  7.8  7.7   Total Protein 6.5 - 8.1 g/dL 5.7  5.0  5.0   Total Bilirubin 0.3 - 1.2 mg/dL 0.2  1.0  1.2   Alkaline Phos 38 - 126 U/L 60  143  93   AST 15 - 41 U/L 16  49  37   ALT 0 - 44 U/L 12  44  32       Latest Ref Rng & Units 02/27/2022    4:01 AM 02/26/2022    4:36 AM 02/25/2022    9:35 AM  CBC  WBC 4.0 - 10.5 K/uL 7.1  1.1  0.6   Hemoglobin 12.0 - 15.0 g/dL 7.6  8.4  7.3   Hematocrit 36.0 - 46.0 % 24.2  24.9  22.3   Platelets 150 - 400 K/uL 426  77  63       GENERAL: Chronically ill-appearing elderly female, in NAD  SKIN: skin color, texture, turgor are normal, no rashes or significant lesions EYES: conjunctiva are pink and non-injected, sclera clear LUNGS: clear to auscultation and percussion with normal breathing effort HEART: regular rate & rhythm  and no murmurs and no lower extremity edema Musculoskeletal: no cyanosis of digits and no clubbing  PSYCH: alert & oriented x 3, fluent speech NEURO: no focal motor/sensory deficits  Assessment/Plan:  [name] is a 72 year old female with medical history significant for Hodgkin's lymphoma currently undergoing treatment with AVD chemotherapy who presents with neutropenic fever.  #Neutropenic Fever-resolved.  --patient has received long-acting G-CSF (pegfilgrastim) on 02/17/2022.  No indication for Granix at this time.  Will need to await counts to arise from prior G-CSF administration.  Currently ANC is 4600 with WBC 7.1  --patient has been afebrile, OK to stop antibiotic therapy.  --no evidence of infection in culture data.  --No barriers to discharge from an oncology standpoint. --Oncology will continue to follow.  #Hodgkin's Lymphoma, Stage III --biopsy of abdominal lymph node on 12/27/2021 showed classical Hodgkin's lymphoma.  --Pre-treatment PET scan from [facility] 5 abdominal adenopathy along with mild  hypermetabolism involving mediastinal and bilateral axillary nodes. --Recommend AVD chemotherapy x 2 cycles followed by PET CT scan.  PLAN: -- continue long-acting G-CSF with the patient's regimen given her episode of neutropenic fever.  This is a rare occurrence and Hodgkin's lymphoma treated with AVD. --additionally will need to consider dose adjustment for next cycle.   Ledell Peoples, MD Department of Hematology/Oncology [place] at [facility] Phone: [phone] Pager: [phone] Email: [email or web address]

## 2022-02-28 ENCOUNTER — Inpatient Hospital Stay: Payer: Medicare Other

## 2022-02-28 ENCOUNTER — Telehealth: Payer: Self-pay

## 2022-02-28 ENCOUNTER — Inpatient Hospital Stay (HOSPITAL_COMMUNITY): Payer: Medicare Other

## 2022-02-28 ENCOUNTER — Inpatient Hospital Stay: Payer: Medicare Other | Admitting: Physician Assistant

## 2022-02-28 ENCOUNTER — Other Ambulatory Visit: Payer: Medicare Other

## 2022-02-28 DIAGNOSIS — R5081 Fever presenting with conditions classified elsewhere: Secondary | ICD-10-CM | POA: Diagnosis not present

## 2022-02-28 DIAGNOSIS — T80218A Other infection due to central venous catheter, initial encounter: Secondary | ICD-10-CM | POA: Diagnosis not present

## 2022-02-28 DIAGNOSIS — D709 Neutropenia, unspecified: Secondary | ICD-10-CM | POA: Diagnosis not present

## 2022-02-28 HISTORY — PX: IR REMOVAL TUN ACCESS W/ PORT W/O FL MOD SED: IMG2290

## 2022-02-28 LAB — CBC WITH DIFFERENTIAL/PLATELET
Abs Immature Granulocytes: 0.53 10*3/uL — ABNORMAL HIGH (ref 0.00–0.07)
Basophils Absolute: 0.1 10*3/uL (ref 0.0–0.1)
Basophils Relative: 1 %
Eosinophils Absolute: 0 10*3/uL (ref 0.0–0.5)
Eosinophils Relative: 0 %
HCT: 25.6 % — ABNORMAL LOW (ref 36.0–46.0)
Hemoglobin: 8.5 g/dL — ABNORMAL LOW (ref 12.0–15.0)
Immature Granulocytes: 10 %
Lymphocytes Relative: 31 %
Lymphs Abs: 1.7 10*3/uL (ref 0.7–4.0)
MCH: 30.1 pg (ref 26.0–34.0)
MCHC: 33.2 g/dL (ref 30.0–36.0)
MCV: 90.8 fL (ref 80.0–100.0)
Monocytes Absolute: 1.2 10*3/uL — ABNORMAL HIGH (ref 0.1–1.0)
Monocytes Relative: 22 %
Neutro Abs: 1.9 10*3/uL (ref 1.7–7.7)
Neutrophils Relative %: 36 %
Platelets: 87 10*3/uL — ABNORMAL LOW (ref 150–400)
RBC: 2.82 MIL/uL — ABNORMAL LOW (ref 3.87–5.11)
RDW: 15.9 % — ABNORMAL HIGH (ref 11.5–15.5)
WBC: 5.3 10*3/uL (ref 4.0–10.5)
nRBC: 4.5 % — ABNORMAL HIGH (ref 0.0–0.2)

## 2022-02-28 LAB — GLUCOSE, CAPILLARY
Glucose-Capillary: 77 mg/dL (ref 70–99)
Glucose-Capillary: 92 mg/dL (ref 70–99)
Glucose-Capillary: 87 mg/dL (ref 70–99)
Glucose-Capillary: 125 mg/dL — ABNORMAL HIGH (ref 70–99)

## 2022-02-28 LAB — CULTURE, BLOOD (ROUTINE X 2)
Culture: NO GROWTH
Culture: NO GROWTH

## 2022-02-28 MED ORDER — INSULIN GLARGINE-YFGN 100 UNIT/ML ~~LOC~~ SOLN: 8.0000 [IU] | Freq: Two times a day (BID) | SUBCUTANEOUS | Status: DC

## 2022-02-28 MED ORDER — LIDOCAINE HCL 1 % IJ SOLN
INTRAMUSCULAR | Status: AC
  Filled 2022-02-28: qty 20

## 2022-02-28 MED ORDER — DOXYCYCLINE HYCLATE 100 MG PO TABS
100.0000 mg | ORAL_TABLET | Freq: Two times a day (BID) | ORAL | Status: DC
  Administered 2022-02-28 – 2022-03-01 (×3): 100 mg via ORAL
  Filled 2022-02-28 (×3): qty 1

## 2022-02-28 NOTE — Plan of Care (Signed)

## 2022-02-28 NOTE — Care Management Important Message (Signed)
Important Message  Patient Details IM Letter given to the Patient. Name: Heather Petty MRN: [id number] Date of Birth: 1950/02/29   Medicare Important Message Given:  Yes     Kerin Salen 02/28/2022, 12:47 PM

## 2022-02-28 NOTE — Progress Notes (Signed)
PROGRESS NOTE   Heather Petty  UPJ:[id number] DOB: [birth year] DOA: 02/23/2022 PCP: Pcp, No  Brief Narrative:  72 y/o [place] dwelling  DM TY 2 reflux-prior stroke, NASH, prior MRSA enterococcal bacteremia 2021, gout, schizophrenia, asthma Relatively recent diagnosis Hodgkin lymphoma follow Dr. Lorenso Courier currently on AVD 6  Recent admit 15 thru 1.18.2024 febrile neutropenia --was started on Granix at the time cefepime blood cultures showed no growth urine culture multiple organisms and patient was discharged home  Patient returns back 1/19 fever 103 hypoxemia mental status change found to have hemoglobin 6.6 also CT abdomen pelvis showed decubitus ulcers left buttocks through left posterior iliac and possible splenic infarct  Presumed to have a skin source, patient transfused Needs to have Port-A-Cath removed tip cultured and decision made about when to replace  Hospital-Problem based course  Febrile neutropenia source 2/2 skin excoriations versus infected Port-A-Cath - Blood cultures negative, source equals denuded skin - Discussed with ID on 1/23-they recommend removal of Port-A-Cath recommend a total of doxycycline and Augmentin for 7 days (ending recommend blood culture X2 and Port-A-Cath tip culture-have engaged IR for this -Placing on tramadol low-dose for skin excoriations and pain-have encouraged patient again to move  ?  Oropharyngeal thrush - Start nystatin swish and swallow given difficulty swallowing and probable aphthous ulcers related to chemo - Do not think systemic therapy is necessary at this time  Moderate to severe hypokalemia -Start K-Dur 40 twice daily -  can check labs again periodically with magnesium  Hodgkin's lymphoma on AVD Pancytopenia -Last chemo was 12/26 and likely is the cause for the pancytopenia that the patient has - Given 2 units of blood 1/19 and hemoglobin has resolved - Received PEGfillgastrum 1/12 send no need for Granix  -ANC is  improved and should recover  Hypoxemia - No pneumonia, potentially related to habitus monitor trends  Schizophrenia - Continue Sinemet 125 at bedtime, hydroxyzine 25 twice daily  HTN  -Continue Coreg 6.25 twice daily, amlodipine 10, lisinopril 5  DM TY 2 complication of hypoglycemia - Hypoglycemic on a.m. 1/22-in the setting would not resume glipizide - Patient is not eating much at all therefore we have discontinued all long-acting and only keep on sliding scale coverage  Sacral decubiti - Cleanse as per wound care nurse, apply Xeroform and folded layer to wound with silicone foam and change daily - Pain meds as above  NASH cirrhosis gout all stable  DVT prophylaxis: SCD secondary to low counts Code Status: Full Family Communication: None Disposition:  Status is: Inpatient Remains inpatient appropriate because:   ANC is improved likely can DC to skilled tomorrow   Subjective:  Pain is moderate-she seems a little better than previous but is still having discomfort She has not really been out of bed Nursing reports not eating at all only drinking Ensure I have encouraged her to turn and move but she still seems to be only in bed    Objective: Vitals:   02/27/22 1447 02/27/22 1950 02/28/22 0450 02/28/22 1117  BP: 134/86 135/61 137/69 (!) 140/63  Pulse: 86 80 79 71  Resp: '14 20 20 '$ (!) 21  Temp: 97.8 F (36.6 C) 98.1 F (36.7 C) 97.8 F (36.6 C) 98.1 F (36.7 C)  TempSrc: Oral Oral Oral Oral  SpO2: 98% 100%  96%  Weight:   104.1 kg   Height:        Intake/Output Summary (Last 24 hours) at 02/28/2022 1257 Last data filed at 02/28/2022 1030 Gross per 24 hour  Intake 815 ml  Output 1052 ml  Net -237 ml    Filed Weights   02/26/22 0337 02/27/22 0519 02/28/22 0450  Weight: 104.1 kg 104.8 kg 104.1 kg    Examination:  Sparse hair no icterus no pallor thick neck Multiple skin excoriations dryness and scale S1-S2 no murmur no rub no gallop Chest is clear no  wheeze no rales no rhonchi Moving lower extremities and upper extremities but poor effort I did not examine her wounds today  Data Reviewed: personally reviewed   CBC    Component Value Date/Time   WBC 5.3 02/28/2022 0318   RBC 2.82 (L) 02/28/2022 0318   HGB 8.5 (L) 02/28/2022 0318   HGB 6.6 (LL) 02/22/2022 1058   HCT 25.6 (L) 02/28/2022 0318   HCT 25.3 (L) 12/21/2017 0150   PLT 87 (L) 02/28/2022 0318   PLT 75 (L) 02/22/2022 1058   MCV 90.8 02/28/2022 0318   MCH 30.1 02/28/2022 0318   MCHC 33.2 02/28/2022 0318   RDW 15.9 (H) 02/28/2022 0318   LYMPHSABS 1.7 02/28/2022 0318   MONOABS 1.2 (H) 02/28/2022 0318   EOSABS 0.0 02/28/2022 0318   BASOSABS 0.1 02/28/2022 0318      Latest Ref Rng & Units 02/27/2022    4:01 AM 02/26/2022    4:36 AM 02/25/2022    3:14 AM  CMP  Glucose 70 - 99 mg/dL 96  146  191   BUN 8 - 23 mg/dL '21  25  30   '$ Creatinine 0.44 - 1.00 mg/dL 0.77  0.87  0.94   Sodium 135 - 145 mmol/L 135  139  135   Potassium 3.5 - 5.1 mmol/L 3.8  2.9  3.1   Chloride 98 - 111 mmol/L 108  109  105   CO2 22 - 32 mmol/L '22  24  23   '$ Calcium 8.9 - 10.3 mg/dL 7.5  7.8  7.7   Total Protein 6.5 - 8.1 g/dL 5.7  5.0  5.0   Total Bilirubin 0.3 - 1.2 mg/dL 0.2  1.0  1.2   Alkaline Phos 38 - 126 U/L 60  143  93   AST 15 - 41 U/L 16  49  37   ALT 0 - 44 U/L 12  44  32      Radiology Studies: No results found.   Scheduled Meds:  sodium chloride   Intravenous Once   sodium chloride   Intravenous Once   allopurinol  100 mg Oral Daily   amLODipine  10 mg Oral Daily   amoxicillin-clavulanate  1 tablet Oral Q12H   calamine  1 Application Topical BID   carvedilol  6.25 mg Oral BID WC   Chlorhexidine Gluconate Cloth  6 each Topical Daily   cholecalciferol  2,000 Units Oral Daily   doxycycline  100 mg Oral Q12H   fluticasone  1 spray Each Nare Daily   Gerhardt's butt cream   Topical QID   insulin aspart  0-6 Units Subcutaneous TID WC   lactose free nutrition  237 mL Oral BID BM    lisinopril  5 mg Oral Daily   loratadine  10 mg Oral Daily   miconazole nitrate   Topical BID   nystatin  5 mL Oral QID   sodium chloride flush  10-40 mL Intracatheter Q12H   Continuous Infusions:  lactated ringers 75 mL/hr at 02/28/22 0500     LOS: 5 days   Time spent: [place], MD [facility] To  contact the attending provider between 7A-7P or the covering provider during after hours 7P-7A, please log into the web site [email or web address] and access using universal  password for that web site. If you do not have the password, please call the hospital operator.  02/28/2022, 12:57 PM

## 2022-02-28 NOTE — Consult Note (Signed)
Chief Complaint: Concern for infection. Request is for portacath removal  Referring Physician(s): Dr. Nada Libman  Supervising Physician: Michaelle Birks  Patient Status: [facility] - In-pt  History of Present Illness: Heather Petty is a 72 y.o. female inpatient. History of DM, CVA, NASH, schizophrenia, asthma, Hodgkin lymphoma s/p  RIJ portacath placement by IR on 12.5.23. team is concerned that the patient has had 2 episodes of fever of unknown etiology. Team is requesting a portacath removal with tip cultures.   Return precautions and treatment recommendations and follow-up discussed with the patient  who is agreeable with the plan.   Past Medical History:  Diagnosis Date   AKI (acute kidney injury) ([place]) 12/17/2017   Allergic rhinitis    Anemia    Arthritis    Asthma    Autonomic neuropathy    Depression    Diabetes mellitus    Diabetes mellitus without complication (HCC)    E coli infection    GERD (gastroesophageal reflux disease)    GERD (gastroesophageal reflux disease)    Gout    Hepatitis A    High cholesterol    Hypertension    Insomnia    Kidney failure    Metabolic encephalopathy    Morbid obesity (HCC)    NASH (nonalcoholic steatohepatitis)    Pneumonia 12/17/2017   Schizophrenia ([place])    Stroke ([place])    Vitamin B deficiency    Vitamin D deficiency     Past Surgical History:  Procedure Laterality Date   CYST EXCISION     buttucks   DENTAL SURGERY     IR IMAGING GUIDED PORT INSERTION  01/10/2022   TONSILLECTOMY      Allergies: Emend [aprepitant], Vancomycin, Fosaprepitant, Lasix [furosemide], and Lasix [furosemide]  Medications: Prior to Admission medications   Medication Sig Start Date End Date Taking? Authorizing Provider  acetaminophen (TYLENOL) 500 MG tablet Take 1,000 mg by mouth 3 (three) times daily as needed for moderate pain.   Yes [provider]  albuterol (VENTOLIN HFA) 108 (90 Base) MCG/ACT inhaler Inhale 2 puffs into the  lungs every 4 (four) hours as needed for wheezing or shortness of breath.   Yes [provider]  allopurinol (ZYLOPRIM) 100 MG tablet Take 100 mg by mouth daily. 01/19/22  Yes [provider]  amLODipine (NORVASC) 10 MG tablet Take 10 mg by mouth daily.   Yes [provider]  carvedilol (COREG) 6.25 MG tablet Take 6.25 mg by mouth 2 (two) times daily with a meal.   Yes [provider]  cholecalciferol (VITAMIN D3) 25 MCG (1000 UNIT) tablet Take 2,000 Units by mouth daily.   Yes [provider]  doxepin (SINEQUAN) 25 MG capsule Take 25 mg by mouth at bedtime.   Yes [provider]  famotidine (PEPCID) 20 MG tablet Take 20 mg by mouth 2 (two) times daily.   Yes [provider]  fluticasone (FLONASE) 50 MCG/ACT nasal spray Place 1 spray into both nostrils daily.   Yes [provider]  glipiZIDE (GLUCOTROL XL) 10 MG 24 hr tablet Take 10 mg by mouth 2 (two) times daily.    Yes [provider]  guaiFENesin (MUCINEX) 600 MG 12 hr tablet Take 600 mg by mouth every 12 (twelve) hours.   Yes [provider]  hydrOXYzine (ATARAX/VISTARIL) 25 MG tablet Take 25 mg by mouth in the morning and at bedtime.   Yes [provider]  insulin aspart (NOVOLOG) 100 UNIT/ML injection Inject 0-12 Units into  the skin See admin instructions. Inject 0-12 units into the skin 4 times a day (before meals and at bedtime) per sliding scale: BGL <60 or >400  = CALL MD; 60-200 = 0 units; 201-250 = 2 units; 251-300 = 4 units; 301-350 = 6 units; 351-400 = 8 units; 401-450 = 10 units; >450 = 12 units 07/31/19  Yes Annita Brod, MD  lactose free nutrition (BOOST) LIQD Take 237 mLs by mouth 2 (two) times daily between meals.   Yes [provider]  LANTUS SOLOSTAR 100 UNIT/ML Solostar Pen Inject 28-38 Units into the skin 2 (two) times daily. Inject 38 units in the morning and Inject 28 units at bedtime 02/02/22  Yes [provider]  lidocaine 4 % Place 1 patch onto the skin daily.   Yes [provider]  lidocaine-prilocaine (EMLA) cream Apply 1 Application topically as needed. Patient taking differently: Apply 1 Application topically as needed (access port). 01/16/22  Yes Dede Query T, PA-C  lisinopril (ZESTRIL) 5 MG tablet Take 5 mg by mouth daily.   Yes [provider]  loperamide (IMODIUM) 2 MG capsule Take 1 capsule (2 mg total) by mouth as needed for diarrhea or loose stools. 09/09/19  Yes Pokhrel, Laxman, MD  loratadine (CLARITIN) 10 MG tablet Take 10 mg by mouth daily.   Yes [provider]  mupirocin ointment (BACTROBAN) 2 % Apply 1 Application topically 2 (two) times daily.   Yes [provider]  Pollen Extracts (PROSTAT PO) Take 30 mLs by mouth in the morning and at bedtime.   Yes [provider]  Polyethyl Glycol-Propyl Glycol (SYSTANE ULTRA) 0.4-0.3 % SOLN Place 2 drops into both eyes in the morning and at bedtime.   Yes [provider]  polyethylene glycol (MIRALAX / GLYCOLAX) 17 g packet Take 17 g by mouth daily as needed for mild constipation.   Yes [provider]  prochlorperazine (COMPAZINE) 10 MG tablet Take 1 tablet (10 mg total) by mouth every 6 (six) hours as needed for nausea or vomiting. 01/16/22  Yes Thayil, Irene T, PA-C  Simethicone (GAS-X ULTRA STRENGTH) 180 MG CAPS Take 1 capsule by mouth every 8 (eight) hours as needed (gas).   Yes [provider]  Skin Protectants, Misc. (EUCERIN) cream Apply 1 Application topically 2 (two) times daily. Where skin is peeling   Yes [provider]  zinc oxide 20 % ointment Apply 1 Application topically See admin instructions. Every shift and as needed (bilateral buttock)   Yes [provider]     Family History  Adopted: Yes  Problem Relation Age of Onset   COPD Daughter     Social History   Socioeconomic History   Marital status: Unknown    Spouse  name: Not on file   Number of children: 2   Years of education: Not on file   Highest education level: Not on file  Occupational History   Occupation: retired/disabled  Tobacco Use   Smoking status: Never   Smokeless tobacco: Never  Vaping Use   Vaping Use: Never used  Substance and Sexual Activity   Alcohol use: Never   Drug use: Never   Sexual activity: Not Currently    Birth control/protection: None  Other Topics Concern   Not on file  Social History Narrative   ** Merged History Encounter **       Social Determinants of Health   Financial Resource Strain: Not on file  Food Insecurity: No Food Insecurity (  02/26/2022)   Hunger Vital Sign    Worried About Running Out of Food in the Last Year: Never true    [place] in the Last Year: Never true  Transportation Needs: No Transportation Needs (02/26/2022)   PRAPARE - Hydrologist (Medical): No    Lack of Transportation (Non-Medical): No  Physical Activity: Not on file  Stress: Not on file  Social Connections: Not on file     Review of Systems: A 12 point ROS discussed and pertinent positives are indicated in the HPI above.  All other systems are negative.  Review of Systems  Constitutional:  Positive for fever. Negative for fatigue.  HENT:  Negative for congestion. Dental problem: intermittent not at the moment.  Respiratory:  Negative for cough and shortness of breath.   Gastrointestinal:  Negative for abdominal pain, diarrhea, nausea and vomiting.  Skin:        itchy    Vital Signs: BP (!) 140/63   Pulse 71   Temp 98.1 F (36.7 C) (Oral)   Resp (!) 21   Ht '5\' 1"'$  (1.549 m)   Wt 229 lb 8 oz (104.1 kg)   SpO2 96%   BMI 43.36 kg/m    Physical Exam Vitals and nursing note reviewed.  Constitutional:      Appearance: She is well-developed. She is obese.  HENT:     Head: Normocephalic and atraumatic.  Eyes:     Conjunctiva/sclera: Conjunctivae normal.  Cardiovascular:      Rate and Rhythm: Normal rate and regular rhythm.     Heart sounds: Normal heart sounds.  Pulmonary:     Effort: Pulmonary effort is normal.     Breath sounds: Normal breath sounds.  Musculoskeletal:        General: Normal range of motion.     Cervical back: Normal range of motion.  Skin:    General: Skin is warm and dry.     Comments: scaly  Neurological:     Mental Status: She is alert and oriented to person, place, and time.     Imaging: CT Head Wo Contrast  Result Date: 02/24/2022 CLINICAL DATA:  Mental status change, unknown cause EXAM: CT HEAD WITHOUT CONTRAST TECHNIQUE: Contiguous axial images were obtained from the base of the skull through the vertex without intravenous contrast. RADIATION DOSE REDUCTION: This exam was performed according to the departmental dose-optimization program which includes automated exposure control, adjustment of the mA and/or kV according to patient size and/or use of iterative reconstruction technique. COMPARISON:  CT head June 14, 21. FINDINGS: Brain: No evidence of acute infarction, hemorrhage, hydrocephalus, extra-axial collection or mass lesion/mass effect. Vascular: No hyperdense vessel. Skull: No acute fracture. Sinuses/Orbits: Clear sinuses.  No acute orbital findings. Other: No mastoid effusions. IMPRESSION: No evidence of acute intracranial abnormality. Electronically Signed   By: Margaretha Sheffield M.D.   On: 02/24/2022 14:54   CT ABDOMEN PELVIS W CONTRAST  Result Date: 02/23/2022 CLINICAL DATA:  Hodgkin's lymphoma. Sacral wound, fever, hypoxia and lethargy. * Tracking Code: BO * EXAM: CT ABDOMEN AND PELVIS WITH CONTRAST TECHNIQUE: Multidetector CT imaging of the abdomen and pelvis was performed using the standard protocol following bolus administration of intravenous contrast. RADIATION DOSE REDUCTION: This exam was performed according to the departmental dose-optimization program which includes automated exposure control, adjustment of the mA  and/or kV according to patient size and/or use of iterative reconstruction technique. CONTRAST:  155m OMNIPAQUE IOHEXOL 300 MG/ML  SOLN  COMPARISON:  02/10/2022 CT scan FINDINGS: Lower chest: Mild linear subsegmental atelectasis in the right lower lobe. Stable focal linear scarring in the lingula. Descending thoracic aortic atherosclerosis. Hepatobiliary: Borderline distention of the gallbladder. Small gallstones are present dependently in the gallbladder. Pancreas: Unremarkable Spleen: There is a anterolateral band of hypodensity extending in the spleen on images 20-25 of series 2 which is new compared to prior exams. Possibilities include heterogeneity related to early contrast phase, a small splenic infarct, a lymphoma related lesion with atypical morphology, or less likely a small splenic laceration. No perisplenic ascites to further suggest recent splenic trauma. Adrenals/Urinary Tract: The urinary bladder extends 1.4 cm below the pubococcygeal line, compatible with pelvic floor laxity and cystocele. The kidneys and adrenal glands appear unremarkable. Stomach/Bowel: Sigmoid colon diverticulosis. Otherwise unremarkable. Vascular/Lymphatic: Atherosclerosis is present, including aortoiliac atherosclerotic disease. Atheromatous plaque at the origins of the celiac trunk and SMA may be causing some degree of stenosis, but both vessels opacify and accordingly no overt occlusion is identified. There is atheromatous plaque proximally in both renal arteries. Retroperitoneal adenopathy is present, index left periaortic lymph node 1.5 cm in short axis on image 41 series 2, formerly the same. Overall no substantive change in the visualized adenopathy. Reproductive: No significant findings Other: No supplemental non-categorized findings. Musculoskeletal: Dextroconvex lumbar scoliosis with rotary component. Sclerotic Schmorl's nodes and degenerative endplate findings at several lumbar levels. Right eccentric vertical  narrowing of the L5 vertebral body as before. Multilevel lumbar impingement due to spondylosis and degenerative disc disease. Focal band of subcutaneous edema extends from the left buttock towards the left posterior iliac bone on image 60 of series 2, with associated overlying cutaneous thickening. This has increased substantially since 02/10/2022 and likely rep reflects the reported decubitus ulcer. Although this extends faintly towards the periosteal margin, we do not demonstrate a deep gas-filled cavity, and there is no bony demineralization or evidence of underlying bony involvement to suggest osteomyelitis. Along the top of the gluteal cleft, there is also a suspected ulceration or lesion extending down towards the sacrum on images 66 through 72 of series 2, increased from prior. Although this abuts the posterior sacral margin there are no findings of bony demineralization or osteomyelitis. Small radiodensity along the associated wound may represent bandaging. No drainable abscess observed. IMPRESSION: 1. Decubitus ulcers along the left buttock extending towards the left posterior iliac bone, and along the upper margin of the gluteal cleft. Both have edema extending towards the adjacent bony structures but no findings of osteomyelitis or separate drainable abscess. 2. New band of hypodensity extending in the spleen, differential diagnostic considerations include small splenic infarct, heterogeneous enhancement related to early contrast phase, lymphoma related lesion with atypical morphology, or less likely a small splenic laceration. No perisplenic ascites to further suggest recent splenic trauma. 3. Stable retroperitoneal adenopathy. 4. Pelvic floor laxity and cystocele. 5. Sigmoid colon diverticulosis. 6. Cholelithiasis. 7. Atheromatous plaque at the origins of the celiac trunk and SMA may be causing some degree of stenosis, but both vessels opacify and accordingly no overt occlusion is identified. 8.  Dextroconvex lumbar scoliosis with rotary component. Multilevel lumbar impingement due to spondylosis and degenerative disc disease. 9. Aortic atherosclerosis. Aortic Atherosclerosis (ICD10-I70.0). Electronically Signed   By: Van Clines M.D.   On: 02/23/2022 15:03   DG Chest Port 1 View  Result Date: 02/23/2022 CLINICAL DATA:  Questionable sepsis - evaluate for abnormality EXAM: PORTABLE CHEST 1 VIEW COMPARISON:  February 10, 2022 FINDINGS: The cardiomediastinal silhouette is unchanged  in contour.RIGHT chest port with tip terminating over the superior cavoatrial junction. Atherosclerotic calcifications. No pleural effusion. No pneumothorax. No acute pleuroparenchymal abnormality. IMPRESSION: No acute cardiopulmonary abnormality. Electronically Signed   By: Valentino Saxon M.D.   On: 02/23/2022 13:59   US Abdomen Limited RUQ (LIVER/GB)  Result Date: 02/10/2022 CLINICAL DATA:  [id number] Abdominal pain [id number] EXAM: ULTRASOUND ABDOMEN LIMITED COMPARISON:  11/06/2019 FINDINGS: The liver demonstrates increased parenchymal echogenicity consistent with fatty infiltration. No focal hepatic parenchymal lesions or intrahepatic ductal dilatation. Layering small shadowing gallstones. No Wall thickening or pericholecystic fluid. CBD measured 0.5cm. IMPRESSION: Hepatic fatty infiltration.  Cholelithiasis. Electronically Signed   By: Sammie Bench M.D.   On: 02/10/2022 22:47   CT ABDOMEN PELVIS W CONTRAST  Result Date: 02/10/2022 CLINICAL DATA:  Hodgkin's lymphoma, vomiting EXAM: CT ABDOMEN AND PELVIS WITH CONTRAST TECHNIQUE: Multidetector CT imaging of the abdomen and pelvis was performed using the standard protocol following bolus administration of intravenous contrast. RADIATION DOSE REDUCTION: This exam was performed according to the departmental dose-optimization program which includes automated exposure control, adjustment of the mA and/or kV according to patient size and/or use of iterative reconstruction  technique. CONTRAST:  100 mL OMNIPAQUE IOHEXOL 300 MG/ML  SOLN COMPARISON:  12/09/2021 and 07/21/2019. FINDINGS: Lower chest: Atheromatous changes of coronary arteries and aorta. No pleural or pericardial effusion. Stable scarring identified lingula with a 1 cm density noted to be stable. Hepatobiliary: No lesions identified in the liver. No intrahepatic ductal dilatation. Gallbladder contains small calcified stones or layering milk of calcium. Pancreas: Fatty atrophy.  No focal lesions identified Spleen: Normal in size without focal abnormality. Adrenals/Urinary Tract: Adrenal glands are unremarkable. Kidneys are normal, without renal calculi, focal lesion, or hydronephrosis. Bladder is unremarkable. Stomach/Bowel: No focal abnormalities are identified in the stomach. No bowel dilatation to suggest obstruction. Normal appendix. Diverticulosis descending and sigmoid colon. Vascular/Lymphatic: Dense atheromatous calcifications of the aorta and its branches. Para-aortic adenopathy identified with the largest lesion now measuring 3.0 x 1.6 cm compared to 3.6 x 2.1 cm previously. Additional retroperitoneal nodes diminished in size and number compared to prior study. Reproductive: Uterus and bilateral adnexa are unremarkable. Other: No abdominal wall hernia or abnormality. No abdominopelvic ascites. Musculoskeletal: Extensive thoracolumbosacral degenerative changes. IMPRESSION: 1. Hypermetabolic retroperitoneal adenopathy less prominent than on prior study. 2. Diverticulosis. 3. No acute abdominal or pelvic pathology was identified. Electronically Signed   By: Sammie Bench M.D.   On: 02/10/2022 21:56   DG Chest Port 1 View  Result Date: 02/10/2022 CLINICAL DATA:  History of breast cancer and stomach cancer presenting with vomiting. EXAM: PORTABLE CHEST 1 VIEW COMPARISON:  September 04, 2019 FINDINGS: There is stable right-sided venous Port-A-Cath positioning. The heart size and mediastinal contours are within normal  limits. There is moderate severity calcification of the aortic arch. There is no evidence of acute infiltrate, pleural effusion or pneumothorax. The visualized skeletal structures are unremarkable. IMPRESSION: No active cardiopulmonary disease. Electronically Signed   By: Virgina Norfolk M.D.   On: 02/10/2022 21:45    Labs:  CBC: Recent Labs    02/25/22 0935 02/26/22 0436 02/27/22 0401 02/28/22 0318  WBC 0.6* 1.1* 7.1 5.3  HGB 7.3* 8.4* 7.6* 8.5*  HCT 22.3* 24.9* 24.2* 25.6*  PLT 63* 77* 426* 87*    COAGS: Recent Labs    12/27/21 0932 02/10/22 2009 02/23/22 1222  INR 1.1 1.1 1.4*  APTT  --  37* 36    BMP: Recent Labs    02/24/22 0458 02/25/22  9629 02/26/22 0436 02/27/22 0401  NA 135 135 139 135  K 3.6 3.1* 2.9* 3.8  CL 104 105 109 108  CO2 21* '23 24 22  '$ GLUCOSE 212* 191* 146* 96  BUN 25* 30* 25* 21  CALCIUM 7.6* 7.7* 7.8* 7.5*  CREATININE 0.88 0.94 0.87 0.77  GFRNONAA >60 >60 >60 >60    LIVER FUNCTION TESTS: Recent Labs    02/24/22 0458 02/25/22 0314 02/26/22 0436 02/27/22 0401  BILITOT 1.5* 1.2 1.0 0.2*  AST 16 37 49* 16  ALT 18 32 44 12  ALKPHOS 72 93 143* 60  PROT 5.1* 5.0* 5.0* 5.7*  ALBUMIN 2.1* 2.0* 1.9* 2.0*     Assessment and Plan:  72 y.o. female inpatient. History of DM, CVA, NASH, schizophrenia, asthma, Hodgkin lymphoma s/p  RIJ portacath placement by IR on 12.5.23. team is concerned that the patient has had 2 episodes of fever of unknown etiology. Team is requesting a portacath removal with tip cultures.   Risks and benefits of port-a-catheter removal was discussed with the patient including, but not limited to bleeding, infection, pneumothorax, or fibrin sheath development and need for additional procedures.  All of the patient's questions were answered, patient is agreeable to proceed.  Consent signed and in chart.   Thank you for this interesting consult.  I greatly enjoyed meeting Heather Petty and look forward to  participating in their care.  A copy of this report was sent to the requesting provider on this date.  Electronically Signed: Jacqualine Mau, NP 02/28/2022, 12:27 PM   I spent a total of 40 Minutes   in face to face in clinical consultation, greater than 50% of which was counseling/coordinating care for portacath removal

## 2022-02-28 NOTE — Consult Note (Signed)
[facility]    Date of Admission:  02/23/2022   Total days of inpatient antibiotics 5        Reason for Consult: Neutropenic fever    Principal Problem:   Neutropenic fever ([place])   Assessment: 72 YM with Hodgkin's  lymphoma via chest port placed on 12/5 with [facility] infusion day prior to admission, recent admission 1/5-1/8 for neutropenic fever 2/2 unclear source discharged on Augmentin x 5 days admitted with neutropenic fever. Neutropenia has resolved, ID engaged for abx recommendation.    #Neutropenic fever 2/2 suspected port infection #Skin excoriations #Decubitus ulcer  -I examined her skin and decubitus wound and did not appreciate any skin soft tissue infection. But my exam is a few days into hospitalization. -Given this is pt's second episode of neutropenic fever with unclear source since port was placed on 12/5 would remove it. I realize blood Cx have been negative and no signs of local infection at port site but given the temporal relationship of port and fever , would advise to removal. -In this case, would Cx the cath tip, generally would advice against it but given pt's negative blood Cx Cx may be helpful. -I had communicated with primary about switching pt to PO regimen given negative Cx, but on further review of the case I think treat this as a line infection with 2 weeks of IV abx from line removal with dapto+ ceftriaxone starting tomorrow Recommendations:  -Referral to dermatology for skin findings(pt states skin changes are it recent, but not a good historian) -Remove port -Cx tip and repeat blood Cx following prot removal -Plan to transition PO abx ->dapto + ceftriaxone tomorrow -Communicated with IR in regards to port removal  I have personally spent 120 minutes involved in face-to-face and non-face-to-face activities for this patient on the day of the visit. Professional time spent includes the following activities: Preparing to see  the patient (review of tests), Obtaining and/or reviewing separately obtained history (admission/discharge record), Performing a medically appropriate examination and/or evaluation , Ordering medications/tests/procedures, referring and communicating with other health care professionals, Documenting clinical information in the EMR, Independently interpreting results (not separately reported), Communicating results to the patient/family/caregiver, Counseling and educating the patient/family/caregiver and Care coordination (not separately reported).   Microbiology:   Antibiotics: Vancomycin and cefepime 1/18-22 Augmentin and doxy now  Cultures: Blood 1/23     HPI: Heather Petty is a 72 y.o. female with past medical history significant For diabetes, schizophrenia, sacral ulcer, thigh ulcer, Hodgkin's lymphoma chemotherapy with most recent dose on 1/10, recent admission for neutropenic fever, negative blood cultures at the time admitted for neutropenic fever.  Patient presented from SNF with temp of 103, change in mental status.  Day prior to admission she was infused with packed red blood cell.  She was started on vancomycin, blood cultures negative.  Infectious disease engaged for antibiotic recommendations.   Review of Systems: Review of Systems  All other systems reviewed and are negative.   Past Medical History:  Diagnosis Date   AKI (acute kidney injury) ([place]) 12/17/2017   Allergic rhinitis    Anemia    Arthritis    Asthma    Autonomic neuropathy    Depression    Diabetes mellitus    Diabetes mellitus without complication (H[facility])    E coli infection    GERD (gastroesophageal reflux disease)    GERD (gastroesophageal reflux disease)    Gout    Hepatitis  A    High cholesterol    Hypertension    Insomnia    Kidney failure    Metabolic encephalopathy    Morbid obesity (HCC)    NASH (nonalcoholic steatohepatitis)    Pneumonia 12/17/2017   Schizophrenia ([place])    Stroke  (HCC)    Vitamin B deficiency    Vitamin D deficiency     Social History   Tobacco Use   Smoking status: Never   Smokeless tobacco: Never  Vaping Use   Vaping Use: Never used  Substance Use Topics   Alcohol use: Never   Drug use: Never    Family History  Adopted: Yes  Problem Relation Age of Onset   COPD Daughter    Scheduled Meds:  sodium chloride   Intravenous Once   sodium chloride   Intravenous Once   allopurinol  100 mg Oral Daily   amLODipine  10 mg Oral Daily   amoxicillin-clavulanate  1 tablet Oral Q12H   calamine  1 Application Topical BID   carvedilol  6.25 mg Oral BID WC   Chlorhexidine Gluconate Cloth  6 each Topical Daily   cholecalciferol  2,000 Units Oral Daily   doxycycline  100 mg Oral Q12H   fluticasone  1 spray Each Nare Daily   Gerhardt's butt cream   Topical QID   insulin aspart  0-6 Units Subcutaneous TID WC   lactose free nutrition  237 mL Oral BID BM   lisinopril  5 mg Oral Daily   loratadine  10 mg Oral Daily   miconazole nitrate   Topical BID   nystatin  5 mL Oral QID   sodium chloride flush  10-40 mL Intracatheter Q12H   Continuous Infusions:  lactated ringers 75 mL/hr at 02/28/22 0500   PRN Meds:.acetaminophen, albuterol, hydrOXYzine, lip balm, prochlorperazine, sodium chloride flush, traMADol Allergies  Allergen Reactions   Emend [Aprepitant] Other (See Comments)    Pt experienced shooting back pain at 10/10 when medication was administered the first time. See Hypersensitivity note from 01/17/2022.   Vancomycin Other (See Comments)    Patient becomes hypotensive, lethargic and itchy. Has tolerated it many times.   Fosaprepitant Other (See Comments)    Back pain / hypersensitivity 01/17/2022   Lasix [Furosemide] Other (See Comments)    IV-LASIX ==> Reaction: Burning   Lasix [Furosemide] Other (See Comments)    On MAR    OBJECTIVE: Blood pressure (!) 123/47, pulse 75, temperature 98.7 F (37.1 C), temperature source Oral, resp.  rate 20, height '5\' 1"'$  (1.549 m), weight 104.1 kg, SpO2 98 %.  Physical Exam Constitutional:      Appearance: Normal appearance.  HENT:     Head: Normocephalic and atraumatic.     Right Ear: Tympanic membrane normal.     Left Ear: Tympanic membrane normal.     Nose: Nose normal.     Mouth/Throat:     Mouth: Mucous membranes are moist.  Eyes:     Extraocular Movements: Extraocular movements intact.     Conjunctiva/sclera: Conjunctivae normal.     Pupils: Pupils are equal, round, and reactive to light.  Cardiovascular:     Rate and Rhythm: Normal rate and regular rhythm.     Heart sounds: No murmur heard.    No friction rub. No gallop.  Pulmonary:     Effort: Pulmonary effort is normal.     Breath sounds: Normal breath sounds.  Abdominal:     General: Abdomen is flat.  Palpations: Abdomen is soft.  Musculoskeletal:        General: Normal range of motion.  Skin:    General: Skin is warm and dry.  Neurological:     General: No focal deficit present.     Mental Status: She is alert and oriented to person, place, and time.  Psychiatric:        Mood and Affect: Mood normal.        Lab Results Lab Results  Component Value Date   WBC 5.3 02/28/2022   HGB 8.5 (L) 02/28/2022   HCT 25.6 (L) 02/28/2022   MCV 90.8 02/28/2022   PLT 87 (L) 02/28/2022    Lab Results  Component Value Date   CREATININE 0.77 02/27/2022   BUN 21 02/27/2022   NA 135 02/27/2022   K 3.8 02/27/2022   CL 108 02/27/2022   CO2 22 02/27/2022    Lab Results  Component Value Date   ALT 12 02/27/2022   AST 16 02/27/2022   GGT 190 (H) 08/08/2019   ALKPHOS 60 02/27/2022   BILITOT 0.2 (L) 02/27/2022       Laurice Record, [facility] 02/28/2022, 9:33 PM

## 2022-02-28 NOTE — Telephone Encounter (Signed)
Called patient's daughter to get an update on patient. She reports patient remains hospitalized. Patient reports being weak and increased vaginal pain. Will get patient scheduled back at [facility] when discharged.

## 2022-03-01 ENCOUNTER — Ambulatory Visit: Payer: Medicare Other

## 2022-03-01 DIAGNOSIS — R5081 Fever presenting with conditions classified elsewhere: Secondary | ICD-10-CM | POA: Diagnosis not present

## 2022-03-01 DIAGNOSIS — D709 Neutropenia, unspecified: Secondary | ICD-10-CM | POA: Diagnosis not present

## 2022-03-01 DIAGNOSIS — T80218A Other infection due to central venous catheter, initial encounter: Secondary | ICD-10-CM | POA: Diagnosis not present

## 2022-03-01 LAB — CBC WITH DIFFERENTIAL/PLATELET
Abs Immature Granulocytes: 1.07 10*3/uL — ABNORMAL HIGH (ref 0.00–0.07)
Basophils Absolute: 0 10*3/uL (ref 0.0–0.1)
Basophils Relative: 1 %
Eosinophils Absolute: 0 10*3/uL (ref 0.0–0.5)
Eosinophils Relative: 0 %
HCT: 29.3 % — ABNORMAL LOW (ref 36.0–46.0)
Hemoglobin: 9.4 g/dL — ABNORMAL LOW (ref 12.0–15.0)
Immature Granulocytes: 14 %
Lymphocytes Relative: 24 %
Lymphs Abs: 1.8 10*3/uL (ref 0.7–4.0)
MCH: 29.6 pg (ref 26.0–34.0)
MCHC: 32.1 g/dL (ref 30.0–36.0)
MCV: 92.1 fL (ref 80.0–100.0)
Monocytes Absolute: 1.5 10*3/uL — ABNORMAL HIGH (ref 0.1–1.0)
Monocytes Relative: 20 %
Neutro Abs: 3.1 10*3/uL (ref 1.7–7.7)
Neutrophils Relative %: 41 %
Platelets: 109 10*3/uL — ABNORMAL LOW (ref 150–400)
RBC: 3.18 MIL/uL — ABNORMAL LOW (ref 3.87–5.11)
RDW: 16.4 % — ABNORMAL HIGH (ref 11.5–15.5)
WBC: 7.6 10*3/uL (ref 4.0–10.5)
nRBC: 2.8 % — ABNORMAL HIGH (ref 0.0–0.2)

## 2022-03-01 LAB — COMPREHENSIVE METABOLIC PANEL
ALT: 64 U/L — ABNORMAL HIGH (ref 0–44)
AST: 41 U/L (ref 15–41)
Albumin: 2.3 g/dL — ABNORMAL LOW (ref 3.5–5.0)
Alkaline Phosphatase: 355 U/L — ABNORMAL HIGH (ref 38–126)
Anion gap: 8 (ref 5–15)
BUN: 12 mg/dL (ref 8–23)
CO2: 24 mmol/L (ref 22–32)
Calcium: 8 mg/dL — ABNORMAL LOW (ref 8.9–10.3)
Chloride: 107 mmol/L (ref 98–111)
Creatinine, Ser: 0.69 mg/dL (ref 0.44–1.00)
GFR, Estimated: >60 mL/min (ref >60)
Glucose, Bld: 154 mg/dL — ABNORMAL HIGH (ref 70–99)
Potassium: 3.3 mmol/L — ABNORMAL LOW (ref 3.5–5.1)
Sodium: 139 mmol/L (ref 135–145)
Total Bilirubin: 1 mg/dL (ref 0.3–1.2)
Total Protein: 5.3 g/dL — ABNORMAL LOW (ref 6.5–8.1)

## 2022-03-01 LAB — GLUCOSE, CAPILLARY
Glucose-Capillary: 132 mg/dL — ABNORMAL HIGH (ref 70–99)
Glucose-Capillary: 166 mg/dL — ABNORMAL HIGH (ref 70–99)
Glucose-Capillary: 158 mg/dL — ABNORMAL HIGH (ref 70–99)
Glucose-Capillary: 191 mg/dL — ABNORMAL HIGH (ref 70–99)

## 2022-03-01 MED ORDER — ORAL CARE MOUTH RINSE: 15.0000 mL | OROMUCOSAL | Status: DC | PRN

## 2022-03-01 MED ORDER — SODIUM CHLORIDE 0.9 % IV SOLN
500.0000 mg | Freq: Every day | INTRAVENOUS | Status: DC
  Administered 2022-03-01: 500 mg via INTRAVENOUS
  Filled 2022-03-01 (×2): qty 10

## 2022-03-01 MED ORDER — HYDROXYZINE HCL 25 MG PO TABS
25.0000 mg | ORAL_TABLET | Freq: Three times a day (TID) | ORAL | Status: DC
  Administered 2022-03-01 – 2022-03-02 (×2): 25 mg via ORAL
  Filled 2022-03-01 (×2): qty 1

## 2022-03-01 MED ORDER — SODIUM CHLORIDE 0.9 % IV SOLN
2.0000 g | INTRAVENOUS | Status: DC
  Administered 2022-03-01 – 2022-03-02 (×2): 2 g via INTRAVENOUS
  Filled 2022-03-01 (×2): qty 20

## 2022-03-01 NOTE — Progress Notes (Signed)
PHARMACY CONSULT NOTE FOR:  OUTPATIENT  PARENTERAL ANTIBIOTIC THERAPY (OPAT)  Indication: port infection  Regimen: daptomycin '500mg'$  IV q24h + ceftriaxone 2g IV q24h End date: 03/14/2022  IV antibiotic discharge orders are pended. To discharging provider:  please sign these orders via discharge navigator,  Select New Orders & click on the button choice - Manage This Unsigned Work.     Thank you for allowing pharmacy to be a part of this patient's care.  Candie Mile 03/01/2022, 11:58 AM

## 2022-03-01 NOTE — Plan of Care (Signed)

## 2022-03-01 NOTE — Progress Notes (Signed)
PROGRESS NOTE    Heather Petty  MVH:[id number] DOB: 03/02/1950 DOA: 02/23/2022 PCP: Pcp, No   Brief Narrative: 72 year old female with history of recent diagnosis of Hodgkin's lymphoma, type 2 diabetes, stroke, NASH,, asthma, schizophrenia, history of MRSA, Enterococcus bacteremia, recent admissions for febrile neutropenia of unclear etiology. Port-A-Cath placed 01/10/2022 Port-A-Cath removed 02/28/2022 for possible line infection even though blood cultures were negative. Patient now with diffuse skin excoriation?  Secondary to lymphoma? Lives in [place]:   Principal Problem:   Neutropenic fever ([place])   1 febrile neutropenia-?  Secondary to infected port removed 02/28/2022.  Seen by infectious disease.  Recommending IV Rocephin and daptomycin for 2 weeks even though blood cultures have been negative.  2 superficial skin excoriations-unclear reason Discussed with Dr. Lorenso Courier does not think this is from [place] Will likely need a skin biopsy at some point  3 decubitus sacral continue wound care recommendations  4 thrush continue nystatin swish and spit  5 pancytopenia-improving Neutropenia resolved Hemoglobin 9.4 Platelets 109  6 hypertension on Coreg and amlodipine and lisinopril  7 history of type 2 diabetes with hyperglycemia patient not eating hold glipizide CBG (last 3)  Recent Labs    02/28/22 2105 03/01/22 0740 03/01/22 1200  GLUCAP 125* 132* 166*     9 itching continue Atarax and calamine lotion  10 hypokalemia check mag Replete k  11 gout on allopurinol  Pressure Injury 02/27/22 Sacrum Mid Stage 3 -  Full thickness tissue loss. Subcutaneous fat may be visible but bone, tendon or muscle are NOT exposed. (Active)  02/27/22 0551  Location: Sacrum  Location Orientation: Mid  Staging: Stage 3 -  Full thickness tissue loss. Subcutaneous fat may be visible but bone, tendon or muscle are NOT exposed.  Wound Description (Comments):    Present on Admission:   Dressing Type Foam - Lift dressing to assess site every shift;None;Moisture barrier 02/28/22 2200     Estimated body mass index is 43.57 kg/m as calculated from the following:   Height as of this encounter: '5\' 1"'$  (1.549 m).   Weight as of this encounter: 104.6 kg.  DVT prophylaxis: scd Anemia pancytopenia  code Status: Full code Family Communication: None at bedside Disposition Plan:  Status is: Inpatient Remains inpatient appropriate because: IV antibiotics   Consultants:  Infectious disease, oncology, interventional radiology  Procedures: None  antimicrobials:  Anti-infectives (From admission, onward)    Start     Dose/Rate Route Frequency Ordered Stop   03/01/22 1330  DAPTOmycin (CUBICIN) 500 mg in sodium chloride 0.9 % IVPB        500 mg 120 mL/hr over 30 Minutes Intravenous Daily 03/01/22 1152 03/14/22 1959   03/01/22 1300  cefTRIAXone (ROCEPHIN) 2 g in sodium chloride 0.9 % 100 mL IVPB        2 g 200 mL/hr over 30 Minutes Intravenous Every 24 hours 03/01/22 1152 03/14/22 1259   02/28/22 1315  doxycycline (VIBRA-TABS) tablet 100 mg  Status:  Discontinued        100 mg Oral Every 12 hours 02/28/22 1218 03/01/22 1152   02/27/22 2200  amoxicillin-clavulanate (AUGMENTIN) 875-125 MG per tablet 1 tablet  Status:  Discontinued        1 tablet Oral Every 12 hours 02/27/22 1514 03/01/22 1152   02/24/22 2030  ceFEPIme (MAXIPIME) 2 g in sodium chloride 0.9 % 100 mL IVPB  Status:  Discontinued        2 g 200 mL/hr over 30 Minutes  Intravenous Every 8 hours 02/24/22 1237 02/27/22 1514   02/24/22 1330  vancomycin (VANCOREADY) IVPB 750 mg/150 mL  Status:  Discontinued        750 mg 150 mL/hr over 60 Minutes Intravenous Every 24 hours 02/23/22 1628 02/24/22 1235   02/24/22 1330  vancomycin (VANCOCIN) IVPB 1000 mg/200 mL premix  Status:  Discontinued        1,000 mg 200 mL/hr over 60 Minutes Intravenous Every 24 hours 02/24/22 1235 02/27/22 1514   02/24/22 0030   ceFEPIme (MAXIPIME) 2 g in sodium chloride 0.9 % 100 mL IVPB  Status:  Discontinued        2 g 200 mL/hr over 30 Minutes Intravenous Every 12 hours 02/23/22 1821 02/24/22 1237   02/24/22 0030  metroNIDAZOLE (FLAGYL) IVPB 500 mg  Status:  Discontinued        500 mg 100 mL/hr over 60 Minutes Intravenous Every 12 hours 02/23/22 1823 02/25/22 1230   02/23/22 2200  ceFEPIme (MAXIPIME) 2 g in sodium chloride 0.9 % 100 mL IVPB  Status:  Discontinued        2 g 200 mL/hr over 30 Minutes Intravenous 2 times daily 02/23/22 1556 02/23/22 1821   02/23/22 1230  vancomycin (VANCOREADY) IVPB 2000 mg/400 mL        2,000 mg 200 mL/hr over 120 Minutes Intravenous  Once 02/23/22 1222 02/23/22 1527   02/23/22 1215  ceFEPIme (MAXIPIME) 2 g in sodium chloride 0.9 % 100 mL IVPB        2 g 200 mL/hr over 30 Minutes Intravenous  Once 02/23/22 1212 02/23/22 1304   02/23/22 1215  metroNIDAZOLE (FLAGYL) IVPB 500 mg        500 mg 100 mL/hr over 60 Minutes Intravenous  Once 02/23/22 1212 02/23/22 1335   02/23/22 1215  vancomycin (VANCOREADY) IVPB 2000 mg/400 mL  Status:  Discontinued        2,000 mg 200 mL/hr over 120 Minutes Intravenous  Once 02/23/22 1212 02/23/22 1216        Subjective:  Patient is morning troponin to pain in the lower back Dry excoriations noted  Objective: Vitals:   02/28/22 1653 02/28/22 2109 03/01/22 0528 03/01/22 1210  BP: 131/66 (!) 123/47 (!) 145/63 (!) 140/60  Pulse: 72 75 86   Resp: 18 20 (!) 22   Temp: 98.4 F (36.9 C) 98.7 F (37.1 C) 98.2 F (36.8 C)   TempSrc: Oral Oral Oral   SpO2: 100% 98% 95%   Weight:   104.6 kg   Height:        Intake/Output Summary (Last 24 hours) at 03/01/2022 1311 Last data filed at 03/01/2022 0600 Gross per 24 hour  Intake 963.93 ml  Output 1300 ml  Net -336.07 ml   Filed Weights   02/27/22 0519 02/28/22 0450 03/01/22 0528  Weight: 104.8 kg 104.1 kg 104.6 kg    Examination:  General exam: Appears in distress due to itching and  pain Respiratory system: Clear to auscultation. Respiratory effort normal. Cardiovascular system: S1 & S2 heard, RRR. No JVD, murmurs, rubs, gallops or clicks. No pedal edema. Gastrointestinal system: Abdomen is nondistended, soft and nontender. No organomegaly or masses felt. Normal bowel sounds heard. Central nervous system: Alert and oriented. No focal neurological deficits. Extremities: Trace edema Skin: Sacral decubitus ulcer multiple skin excoriations   Data Reviewed: I have personally reviewed following labs and imaging studies  CBC: Recent Labs  Lab 02/24/22 1045 02/25/22 0314 02/25/22 0935 02/26/22 0436 02/27/22 0401  02/28/22 0318 03/01/22 0422  WBC 0.3*   < > 0.6* 1.1* 7.1 5.3 7.6  NEUTROABS 0.1*  --  0.1*  --  4.6 1.9 3.1  HGB 7.8*  7.9*   < > 7.3* 8.4* 7.6* 8.5* 9.4*  HCT 23.2*  23.2*   < > 22.3* 24.9* 24.2* 25.6* 29.3*  MCV 90.6   < > 91.0 89.6 96.8 90.8 92.1  PLT 42*   < > 63* 77* 426* 87* 109*   < > = values in this interval not displayed.   Basic Metabolic Panel: Recent Labs  Lab 02/24/22 0458 02/25/22 0314 02/26/22 0436 02/27/22 0401 03/01/22 0422  NA 135 135 139 135 139  K 3.6 3.1* 2.9* 3.8 3.3*  CL 104 105 109 108 107  CO2 21* '23 24 22 24  '$ GLUCOSE 212* 191* 146* 96 154*  BUN 25* 30* 25* 21 12  CREATININE 0.88 0.94 0.87 0.77 0.69  CALCIUM 7.6* 7.7* 7.8* 7.5* 8.0*   GFR: Estimated Creatinine Clearance: 69.7 mL/min (by C-G formula based on SCr of 0.69 mg/dL). Liver Function Tests: Recent Labs  Lab 02/24/22 0458 02/25/22 0314 02/26/22 0436 02/27/22 0401 03/01/22 0422  AST 16 37 49* 16 41  ALT 18 32 44 12 64*  ALKPHOS 72 93 143* 60 355*  BILITOT 1.5* 1.2 1.0 0.2* 1.0  PROT 5.1* 5.0* 5.0* 5.7* 5.3*  ALBUMIN 2.1* 2.0* 1.9* 2.0* 2.3*   No results for input(s): "LIPASE", "AMYLASE" in the last 168 hours. Recent Labs  Lab 02/23/22 1915  AMMONIA 10   Coagulation Profile: Recent Labs  Lab 02/23/22 1222  INR 1.4*   Cardiac Enzymes: No  results for input(s): "CKTOTAL", "CKMB", "CKMBINDEX", "TROPONINI" in the last 168 hours. BNP (last 3 results) No results for input(s): "PROBNP" in the last 8760 hours. HbA1C: No results for input(s): "HGBA1C" in the last 72 hours. CBG: Recent Labs  Lab 02/28/22 1113 02/28/22 1656 02/28/22 2105 03/01/22 0740 03/01/22 1200  GLUCAP 92 87 125* 132* 166*   Lipid Profile: No results for input(s): "CHOL", "HDL", "LDLCALC", "TRIG", "CHOLHDL", "LDLDIRECT" in the last 72 hours. Thyroid Function Tests: No results for input(s): "TSH", "T4TOTAL", "FREET4", "T3FREE", "THYROIDAB" in the last 72 hours. Anemia Panel: No results for input(s): "VITAMINB12", "FOLATE", "FERRITIN", "TIBC", "IRON", "RETICCTPCT" in the last 72 hours. Sepsis Labs: Recent Labs  Lab 02/23/22 1222 02/23/22 1605 02/23/22 1915 02/23/22 2100  PROCALCITON  --  19.65  --   --   LATICACIDVEN 1.7  --  1.9 1.5    Recent Results (from the past 240 hour(s))  Resp panel by RT-PCR (RSV, Flu A&B, Covid) Anterior Nasal Swab     Status: None   Collection Time: 02/23/22 12:12 PM   Specimen: Anterior Nasal Swab  Result Value Ref Range Status   SARS Coronavirus 2 by RT PCR NEGATIVE NEGATIVE Final    Comment: (NOTE) SARS-CoV-2 target nucleic acids are NOT DETECTED.  The SARS-CoV-2 RNA is generally detectable in upper respiratory specimens during the acute phase of infection. The lowest concentration of SARS-CoV-2 viral copies this assay can detect is 138 copies/mL. A negative result does not preclude SARS-Cov-2 infection and should not be used as the sole basis for treatment or other patient management decisions. A negative result may occur with  improper specimen collection/handling, submission of specimen other than nasopharyngeal swab, presence of viral mutation(s) within the areas targeted by this assay, and inadequate number of viral copies(<138 copies/mL). A negative result must be combined with clinical observations,  patient history, and epidemiological information. The expected result is Negative.  Fact Sheet for Patients:  EntrepreneurPulse.com.au  Fact Sheet for Healthcare Providers:  IncredibleEmployment.be  This test is no t yet approved or cleared by the [place] FDA and  has been authorized for detection and/or diagnosis of SARS-CoV-2 by FDA under an Emergency Use Authorization (EUA). This EUA will remain  in effect (meaning this test can be used) for the duration of the COVID-19 declaration under Section 564(b)(1) of the Act, 21 U.S.C.section 360bbb-3(b)(1), unless the authorization is terminated  or revoked sooner.       Influenza A by PCR NEGATIVE NEGATIVE Final   Influenza B by PCR NEGATIVE NEGATIVE Final    Comment: (NOTE) The Xpert Xpress SARS-CoV-2/FLU/RSV plus assay is intended as an aid in the diagnosis of influenza from Nasopharyngeal swab specimens and should not be used as a sole basis for treatment. Nasal washings and aspirates are unacceptable for Xpert Xpress SARS-CoV-2/FLU/RSV testing.  Fact Sheet for Patients: EntrepreneurPulse.com.au  Fact Sheet for Healthcare Providers: IncredibleEmployment.be  This test is not yet approved or cleared by the [place] FDA and has been authorized for detection and/or diagnosis of SARS-CoV-2 by FDA under an Emergency Use Authorization (EUA). This EUA will remain in effect (meaning this test can be used) for the duration of the COVID-19 declaration under Section 564(b)(1) of the Act, 21 U.S.C. section 360bbb-3(b)(1), unless the authorization is terminated or revoked.     Resp Syncytial Virus by PCR NEGATIVE NEGATIVE Final    Comment: (NOTE) Fact Sheet for Patients: EntrepreneurPulse.com.au  Fact Sheet for Healthcare Providers: IncredibleEmployment.be  This test is not yet approved or cleared by the [place] FDA and has been authorized for detection and/or diagnosis of SARS-CoV-2 by FDA under an Emergency Use Authorization (EUA). This EUA will remain in effect (meaning this test can be used) for the duration of the COVID-19 declaration under Section 564(b)(1) of the Act, 21 U.S.C. section 360bbb-3(b)(1), unless the authorization is terminated or revoked.  Performed at [facility], [place] [address], [place], [place] [address]   Blood Culture (routine x 2)     Status: None   Collection Time: 02/23/22 12:25 PM   Specimen: BLOOD  Result Value Ref Range Status   Specimen Description   Final    BLOOD LEFT ANTECUBITAL Performed at [facility] [address] [place], [place] [address]    Special Requests   Final    BOTTLES DRAWN AEROBIC AND ANAEROBIC Blood Culture results may not be optimal due to an excessive volume of blood received in culture bottles Performed at [facility] [address] [place], [place] [address]    Culture   Final    NO GROWTH 5 DAYS Performed at [facility], [place] [address] [place],  [address]    Report Status 02/28/2022 FINAL  Final  Blood Culture (routine x 2)     Status: None   Collection Time: 02/23/22 12:40 PM   Specimen: BLOOD  Result Value Ref Range Status   Specimen Description   Final    BLOOD RIGHT PORTA CATH Performed at [facility] [address] [place],  [address]    Special Requests   Final    BOTTLES DRAWN AEROBIC AND ANAEROBIC Blood Culture results may not be optimal due to an excessive volume of blood received in culture bottles Performed at [facility] [address] [place], [place]  [address]    Culture   Final    NO GROWTH 5 DAYS Performed at [facility], [place] [address] [place], [place] [address]    Report Status 02/28/2022 FINAL  Final  Urine Culture     Status: None   Collection Time: 02/23/22   1:22 PM   Specimen: In/Out Cath Urine  Result Value Ref Range Status   Specimen Description   Final    IN/OUT CATH URINE Performed at [facility] [address], [place], [place] [address]    Special Requests   Final    NONE Performed at [facility], [place] [address] [place], [place] [address]    Culture   Final    NO GROWTH Performed at [facility], [place] [address] [place], [place] [address]    Report Status 02/24/2022 FINAL  Final  Cath Tip Culture     Status: None (Preliminary result)   Collection Time: 02/28/22 12:18 PM   Specimen: Other Source  Result Value Ref Range Status   Specimen Description   Final    OTHER Performed at [facility] [address], [place], [place] [address]    Special Requests   Final    CATH TIP CULTURE Performed at [facility], [place] [address] [place], [place] [address]    Culture   Final    NO GROWTH < 12 HOURS Performed at [facility] [address], [place], [place] [address]    Report Status PENDING  Incomplete  Culture, blood (Routine X 2) w Reflex to ID Panel     Status: None (Preliminary result)   Collection Time: 02/28/22  1:04 PM   Specimen: BLOOD RIGHT ARM  Result Value Ref Range Status   Specimen Description   Final    BLOOD RIGHT ARM Performed at [facility], [place] [address], [place], [place] [address]    Special Requests   Final    BOTTLES DRAWN AEROBIC ONLY Blood Culture adequate volume Performed at [facility] [address] [place], [place] [address]    Culture   Final    NO GROWTH < 24 HOURS Performed at [facility] [address], [place], [place] [address]    Report Status PENDING  Incomplete  Culture, blood (Routine X 2) w Reflex to ID Panel     Status: None (Preliminary result)   Collection Time: 02/28/22  1:04 PM   Specimen: BLOOD LEFT HAND  Result Value Ref Range Status   Specimen  Description   Final    BLOOD LEFT HAND Performed at [facility], [place] [address], [place], [place] [address]    Special Requests   Final    BOTTLES DRAWN AEROBIC ONLY Blood Culture adequate volume Performed at [facility] [address], [place], [place] [address]    Culture   Final    NO GROWTH < 24 HOURS Performed at [facility] [address] [place],  [address]    Report Status PENDING  Incomplete         Radiology Studies: IR REMOVAL TUN ACCESS W/ PORT W/O FL MOD SED  Result Date: 02/28/2022 INDICATION: Patient has a history of Hodgkin's lymphoma admitted for neutropenic fevers. Team is requesting Port-A-Cath removal due to concern for infection. EXAM: REMOVAL OF IMPLANTED TUNNELED PORT-A-CATH MEDICATIONS: LIDOCAINE 1% 5 ML ANESTHESIA/SEDATION: Local anesthetic was administered. FLUOROSCOPY: None  PROCEDURE: Informed written consent was obtained from the patient after thorough discussion of the procedure risks, benefits and alternatives. All questions were addressed. Maximum sterile barrier technique was utilized including caps, masks, sterile gowns, sterile gloves, sterile drape, hand hygiene and skin antiseptic. A time-out was performed prior to the initiation of the procedure The RIGHT chest was prepped and draped in a sterile fashion. Lidocaine was utilized for local anesthetic. Port-A-Cath was palpated and an incision was made just superior. Utilizing blunt dissection the Port-A-Cath and reservoir were removed with the underlying subcutaneous tissue and their entirety. The pocket was then cleaned out with sterile normal saline The pocket was closed with 3 interrupted 4.0 Vicryl sutures and a 4.0 Vicryl running subcuticular stitch was utilized to approximate the skin. Dermabond was applied. Dressing was applied. The patient tolerated the procedure well without immediate post procedural complication. FINDINGS: Successful removal of implant  Port-A-Cath without immediate post procedural complication. IMPRESSION: Successful removal of a RIGHT chest implanted Port-A-Cath. A tip culture was submitted per Infectious Disease request. Read by: Rushie Nyhan, NP Electronically Signed   By: Michaelle Birks M.D.   On: 02/28/2022 16:49        Scheduled Meds:  sodium chloride   Intravenous Once   sodium chloride   Intravenous Once   allopurinol  100 mg Oral Daily   amLODipine  10 mg Oral Daily   calamine  1 Application Topical BID   carvedilol  6.25 mg Oral BID WC   Chlorhexidine Gluconate Cloth  6 each Topical Daily   cholecalciferol  2,000 Units Oral Daily   fluticasone  1 spray Each Nare Daily   Gerhardt's butt cream   Topical QID   insulin aspart  0-6 Units Subcutaneous TID WC   lactose free nutrition  237 mL Oral BID BM   lisinopril  5 mg Oral Daily   loratadine  10 mg Oral Daily   miconazole nitrate   Topical BID   nystatin  5 mL Oral QID   sodium chloride flush  10-40 mL Intracatheter Q12H   Continuous Infusions:  cefTRIAXone (ROCEPHIN)  IV     DAPTOmycin (CUBICIN) 500 mg in sodium chloride 0.9 % IVPB     lactated ringers 30 mL/hr at 03/01/22 0548     LOS: 6 days    Time spent: 38 min   Georgette Shell, MD  03/01/2022, 1:11 PM

## 2022-03-01 NOTE — Consult Note (Signed)
[facility] Nurse Consult Note: Reason for Consult: Requested for peeling skin/excoriations to buttocks. Patient seen by this writer no 02/12/22 and on day 1 of this admission, on 02/23/22 for this problem which may be disease related. Posterior thigh and sacral wounds POA continue. Seen by infectious disease last evening, see note from that encounter with Dr. Candiss Norse in which provider reports no indication of soft tissue involvement at the site of pressure injury and recommends referral to Dermatology for skin changes. Gerhart's Butt Cream, a compounded 1:1:1 preparation consisting of hydrocortisone cream:lotrimin cream:zinc oxide is in use. DermaTherapy therapeutic bed linen system with low friction coefficient (CoF) is in use.  Topical care guidance for daily care to the Stage 3 sacral pressure injury, POA, with antimicrobial, nonadherent (xeroform) gauze placement after NS cleanse and pat dry covered with dry gauze and secured with silicone foam is in place. Prevalon Boots for heel pressure injury prevention are in place and patient is being turned and repositioned per house protocol.  Regretfully, I have nothing new to add to the POC as further input regarding skin peeling is outside the Scope of [facility] practice.  I agree with ID provider that if further or expanded guidance is desired for the integumentary presentation of dry desquamation, that a consultation with Dermatology could be considered.   [facility] nursing team will not follow, but will remain available to this patient, the nursing and medical teams.  Please re-consult if needed.  Thank you for inviting [place] to participate in this patient's Plan of Care.  Maudie Flakes, MSN, RN, CNS, [place], Serita Grammes, [employer], [employer] phone:  [phone]

## 2022-03-01 NOTE — Progress Notes (Signed)
RN was unable to do the full wound care on patient per order with the silicone dressing due to excess excoriation, flaking, and peeling. Reached out to on call provider for an order for a WOC consult. RN did apply generous amount of Gerhardt's butt cream and applied xeroform to wound.

## 2022-03-02 ENCOUNTER — Inpatient Hospital Stay: Payer: Medicare Other

## 2022-03-02 DIAGNOSIS — R5081 Fever presenting with conditions classified elsewhere: Secondary | ICD-10-CM | POA: Diagnosis not present

## 2022-03-02 DIAGNOSIS — T80218A Other infection due to central venous catheter, initial encounter: Secondary | ICD-10-CM | POA: Diagnosis not present

## 2022-03-02 DIAGNOSIS — D709 Neutropenia, unspecified: Secondary | ICD-10-CM | POA: Diagnosis not present

## 2022-03-02 LAB — CBC WITH DIFFERENTIAL/PLATELET
Abs Immature Granulocytes: 0.51 10*3/uL — ABNORMAL HIGH (ref 0.00–0.07)
Basophils Absolute: 0.1 10*3/uL (ref 0.0–0.1)
Basophils Relative: 1 %
Eosinophils Absolute: 0 10*3/uL (ref 0.0–0.5)
Eosinophils Relative: 0 %
HCT: 29.8 % — ABNORMAL LOW (ref 36.0–46.0)
Hemoglobin: 9.6 g/dL — ABNORMAL LOW (ref 12.0–15.0)
Immature Granulocytes: 10 %
Lymphocytes Relative: 12 %
Lymphs Abs: 0.6 10*3/uL — ABNORMAL LOW (ref 0.7–4.0)
MCH: 30.1 pg (ref 26.0–34.0)
MCHC: 32.2 g/dL (ref 30.0–36.0)
MCV: 93.4 fL (ref 80.0–100.0)
Monocytes Absolute: 0.5 10*3/uL (ref 0.1–1.0)
Monocytes Relative: 9 %
Neutro Abs: 3.6 10*3/uL (ref 1.7–7.7)
Neutrophils Relative %: 68 %
Platelets: 107 10*3/uL — ABNORMAL LOW (ref 150–400)
RBC: 3.19 MIL/uL — ABNORMAL LOW (ref 3.87–5.11)
RDW: 17.8 % — ABNORMAL HIGH (ref 11.5–15.5)
WBC: 5.3 10*3/uL (ref 4.0–10.5)
nRBC: 3.2 % — ABNORMAL HIGH (ref 0.0–0.2)

## 2022-03-02 LAB — COMPREHENSIVE METABOLIC PANEL
ALT: 44 U/L (ref 0–44)
AST: 24 U/L (ref 15–41)
Albumin: 2.5 g/dL — ABNORMAL LOW (ref 3.5–5.0)
Alkaline Phosphatase: 355 U/L — ABNORMAL HIGH (ref 38–126)
Anion gap: 12 (ref 5–15)
BUN: 13 mg/dL (ref 8–23)
CO2: 23 mmol/L (ref 22–32)
Calcium: 7.9 mg/dL — ABNORMAL LOW (ref 8.9–10.3)
Chloride: 102 mmol/L (ref 98–111)
Creatinine, Ser: 0.73 mg/dL (ref 0.44–1.00)
GFR, Estimated: >60 mL/min (ref >60)
Glucose, Bld: 203 mg/dL — ABNORMAL HIGH (ref 70–99)
Potassium: 3.4 mmol/L — ABNORMAL LOW (ref 3.5–5.1)
Sodium: 137 mmol/L (ref 135–145)
Total Bilirubin: 0.9 mg/dL (ref 0.3–1.2)
Total Protein: 5.4 g/dL — ABNORMAL LOW (ref 6.5–8.1)

## 2022-03-02 LAB — GLUCOSE, CAPILLARY
Glucose-Capillary: 178 mg/dL — ABNORMAL HIGH (ref 70–99)
Glucose-Capillary: 162 mg/dL — ABNORMAL HIGH (ref 70–99)

## 2022-03-02 LAB — CK: Total CK: 19 U/L — ABNORMAL LOW (ref 38–234)

## 2022-03-02 MED ORDER — ORAL CARE MOUTH RINSE: 15.0000 mL | OROMUCOSAL | 0 refills | Status: DC | PRN

## 2022-03-02 MED ORDER — GERHARDT'S BUTT CREAM: 1.0000 | TOPICAL_CREAM | Freq: Four times a day (QID) | CUTANEOUS | Status: DC

## 2022-03-02 MED ORDER — NYSTATIN 100000 UNIT/ML MT SUSP: 5.0000 mL | Freq: Four times a day (QID) | OROMUCOSAL | 0 refills | Status: DC

## 2022-03-02 MED ORDER — POTASSIUM CHLORIDE CRYS ER 20 MEQ PO TBCR
40.0000 meq | EXTENDED_RELEASE_TABLET | Freq: Once | ORAL | Status: AC
  Administered 2022-03-02: 40 meq via ORAL
  Filled 2022-03-02: qty 2

## 2022-03-02 MED ORDER — TRAMADOL HCL 50 MG PO TABS: 50.0000 mg | ORAL_TABLET | Freq: Four times a day (QID) | ORAL | 0 refills | Status: DC | PRN

## 2022-03-02 MED ORDER — DAPTOMYCIN IV (FOR PTA / DISCHARGE USE ONLY): 500.0000 mg | INTRAVENOUS | 0 refills | Status: AC

## 2022-03-02 MED ORDER — MICONAZOLE NITRATE POWD: 1.0000 | Freq: Two times a day (BID) | 0 refills | Status: DC

## 2022-03-02 MED ORDER — LIP MEDEX EX OINT: 1.0000 | TOPICAL_OINTMENT | CUTANEOUS | 0 refills | Status: DC | PRN

## 2022-03-02 MED ORDER — CEFTRIAXONE IV (FOR PTA / DISCHARGE USE ONLY): 2.0000 g | INTRAVENOUS | 0 refills | Status: DC

## 2022-03-02 MED ORDER — CALAMINE EX LOTN: 1.0000 | TOPICAL_LOTION | Freq: Two times a day (BID) | CUTANEOUS | 0 refills | Status: DC

## 2022-03-02 NOTE — TOC Progression Note (Signed)
Transition of Care [facility]) - Progression Note    Patient Details  Name: Heather Petty MRN: [id number] Date of Birth: 13-Dec-1950  [facility]) CM/SW [place], LCSW Phone Number: 03/02/2022, 12:05 PM  Clinical Narrative:    Pt to d/c to [facility] , FL2 completed, facility was updated pt will d/c with IV abx. Room assignment 508, call report [phone]. TOC sign off.       Barriers to Discharge: No Barriers Identified  Expected Discharge Plan and Services         Expected Discharge Date: 03/02/22                                     Social Determinants of Health (SDOH) Interventions SDOH Screenings   Food Insecurity: No Food Insecurity (02/26/2022)  Housing: Low Risk  (02/26/2022)  Transportation Needs: No Transportation Needs (02/26/2022)  Utilities: Not At Risk (02/26/2022)  Tobacco Use: Low Risk  (02/28/2022)    Readmission Risk Interventions     No data to display

## 2022-03-02 NOTE — NC FL2 (Signed)
[place] LEVEL OF CARE FORM     IDENTIFICATION  Patient Name: Heather Petty Birthdate: [birth year] Sex: female Admission Date (Current Location): 02/23/2022  [facility] and [place] Number:  Herbalist and Address:  [facility],  [address] [place], [place]      Provider Number: [id number]  Attending Physician Name and Address:  Georgette Shell, MD  Relative Name and Phone Number:  [name] (Daughter) [phone] (Mobile)    Current Level of Care: Hospital Recommended Level of Care: Nursing Facility Prior Approval Number:    Date Approved/Denied:   PASRR Number: [id number]  Discharge Plan: Other (Comment) (LTC)    Current Diagnoses: Patient Active Problem List   Diagnosis Date Noted   Neutropenic fever ([place]) 02/23/2022   Port-A-Cath in place 02/15/2022   Febrile neutropenia ([place]) 02/11/2022   Hodgkin lymphoma ([place]) 01/10/2022   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   Candidal intertrigo 09/25/2019   CAP (community acquired pneumonia) 09/03/2019   Uncontrolled type 2 diabetes mellitus with hyperglycemia ([place]) 09/03/2019   Elevated LFTs 09/03/2019   Essential hypertension 09/03/2019   Gout 09/03/2019   Asthma 09/03/2019   Anemia 09/03/2019   Pneumonia 09/03/2019   Splenomegaly    Anemia 08/05/2019   Diarrhea 08/05/2019   DKA (diabetic ketoacidoses) 07/22/2019   Rhabdomyolysis 42/70/6237   Acute metabolic encephalopathy 62/83/1517   MRSA bacteremia 07/22/2019   Enterococcal bacteremia 07/22/2019   Vitiligo 07/22/2019   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   Sepsis ([place]) 07/19/2018   Ventricular tachycardia, sustained ([place]) 07/19/2018   NSTEMI (non-ST elevated myocardial infarction) ([place]) 07/19/2018   Wound infection 04/03/2018   Morbid obesity with BMI of 50.0-59.9, adult ([place]) 04/03/2018   Elevated alkaline phosphatase level 04/03/2018   Asthma exacerbation 03/14/2018   Hypotension 03/14/2018   Lactic  acidosis 03/14/2018   GERD (gastroesophageal reflux disease) 03/14/2018   Acute renal failure superimposed on stage 3 chronic kidney disease ([place]) 03/14/2018   Macrocytic anemia 03/14/2018   Abnormal LFTs 03/14/2018   Acute kidney injury ([place]) 02/28/2018   Wound of sacral region 02/28/2018   Hypothermia 02/28/2018   Stage II pressure ulcer of sacral region ([place]) 02/15/2018   DM II (diabetes mellitus, type II), controlled ([place]) 02/09/2018   Schizophrenia ([place]) 02/09/2018   Decubitus ulcer of right leg 02/06/2018   Acute renal failure (ARF) ([place]) 02/05/2018   Hyperlipidemia associated with type 2 diabetes mellitus ([place]) 02/05/2018   Hyperkalemia 02/05/2018   Dermatitis 02/05/2018   Open back wound 02/05/2018   Dysuria 02/05/2018   Paranoid schizophrenia ([place])    AKI (acute kidney injury) ([place]) 12/17/2017   Type II diabetes mellitus with renal manifestations ([place]) 12/17/2017   HTN (hypertension) 12/17/2017   Gout     Orientation RESPIRATION BLADDER Height & Weight     Self, Time, Situation, Place  Normal Incontinent Weight: 235 lb 0.2 oz (106.6 kg) Height:  '5\' 1"'$  (154.9 cm)  BEHAVIORAL SYMPTOMS/MOOD NEUROLOGICAL BOWEL NUTRITION STATUS      Incontinent Diet (Heart healthy carb mod)  AMBULATORY STATUS COMMUNICATION OF NEEDS Skin   Total Care Verbally PU Stage and Appropriate Care ([place] -thigh , buttocks)     PU Stage 3 Dressing: Daily                 Personal Care Assistance Level of Assistance    Bathing Assistance: Limited assistance Feeding assistance: Independent Dressing Assistance: Limited assistance Total Care Assistance: Limited assistance   Functional Limitations Info  Sight, Hearing, Speech Sight Info:  Adequate Hearing Info: Adequate Speech Info: Adequate    SPECIAL CARE FACTORS FREQUENCY                       Contractures Contractures Info: Not present    Additional Factors Info  Code Status, Allergies Code Status Info: full Allergies  Info: Emend (aprepitant),Vancomycin,Fosaprepitant,Lasix (furosemide),Lasix (furosemide)           Current Medications (03/02/2022):  This is the current hospital active medication list Current Facility-Administered Medications  Medication Dose Route Frequency Provider Last Rate Last Admin   acetaminophen (TYLENOL) tablet 1,000 mg  1,000 mg Oral TID PRN Myles Rosenthal A, MD   1,000 mg at 02/28/22 0748   albuterol (PROVENTIL) (2.5 MG/3ML) 0.083% nebulizer solution 2.5 mg  2.5 mg Nebulization Q4H PRN Clance Boll, MD       allopurinol (ZYLOPRIM) tablet 100 mg  100 mg Oral Daily Myles Rosenthal A, MD   100 mg at 03/02/22 0925   amLODipine (NORVASC) tablet 10 mg  10 mg Oral Daily Myles Rosenthal A, MD   10 mg at 03/02/22 4782   calamine lotion 1 Application  1 Application Topical BID Nita Sells, MD   1 Application at 95/62/13 0925   carvedilol (COREG) tablet 6.25 mg  6.25 mg Oral BID WC Myles Rosenthal A, MD   6.25 mg at 03/02/22 0865   cefTRIAXone (ROCEPHIN) 2 g in sodium chloride 0.9 % 100 mL IVPB  2 g Intravenous Q24H Laurice Record, MD   Stopped at 03/01/22 1540   Chlorhexidine Gluconate Cloth 2 % PADS 6 each  6 each Topical Daily Eugenie Filler, MD   6 each at 03/01/22 1212   cholecalciferol (VITAMIN D3) 25 MCG (1000 UNIT) tablet 2,000 Units  2,000 Units Oral Daily Myles Rosenthal A, MD   2,000 Units at 03/02/22 0925   DAPTOmycin (CUBICIN) 500 mg in sodium chloride 0.9 % IVPB  500 mg Intravenous Q2000 Laurice Record, MD   Stopped at 03/01/22 1639   fluticasone (FLONASE) 50 MCG/ACT nasal spray 1 spray  1 spray Each Nare Daily Clance Boll, MD   1 spray at 03/02/22 7846   Gerhardt's butt cream   Topical QID Clance Boll, MD   1 Application at 96/29/52 8413   hydrOXYzine (ATARAX) tablet 25 mg  25 mg Oral TID Georgette Shell, MD   25 mg at 03/02/22 0925   insulin aspart (novoLOG) injection 0-6 Units  0-6 Units Subcutaneous TID WC Nita Sells, MD   1 Units at 03/02/22 2440   lactated ringers infusion   Intravenous Continuous Kathryne Eriksson, NP   Stopped at 03/01/22 1444   lactose free nutrition (BOOST PLUS) liquid 237 mL  237 mL Oral BID BM Myles Rosenthal A, MD   237 mL at 03/02/22 0926   lip balm (CARMEX) ointment 1 Application  1 Application Topical PRN Clance Boll, MD   1 Application at 12/03/23 1301   lisinopril (ZESTRIL) tablet 5 mg  5 mg Oral Daily Myles Rosenthal A, MD   5 mg at 03/02/22 0925   loratadine (CLARITIN) tablet 10 mg  10 mg Oral Daily Myles Rosenthal A, MD   10 mg at 03/02/22 3664   miconazole nitrate (MICATIN) topical powder   Topical BID Nita Sells, MD   1 Application at 40/34/74 2595   nystatin (MYCOSTATIN) 100000 UNIT/ML suspension 500,000 Units  5 mL Oral QID Nita Sells, MD   500,000 Units at 03/02/22  0925   Oral care mouth rinse  15 mL Mouth Rinse PRN Nita Sells, MD       potassium chloride SA (KLOR-CON M) CR tablet 40 mEq  40 mEq Oral Once Georgette Shell, MD       prochlorperazine (COMPAZINE) tablet 10 mg  10 mg Oral Q6H PRN Myles Rosenthal A, MD   10 mg at 02/26/22 0226   sodium chloride flush (NS) 0.9 % injection 10-40 mL  10-40 mL Intracatheter Q12H Myles Rosenthal A, MD   10 mL at 03/02/22 0928   sodium chloride flush (NS) 0.9 % injection 10-40 mL  10-40 mL Intracatheter PRN Clance Boll, MD       traMADol Veatrice [place]) tablet 50 mg  50 mg Oral Q6H PRN Nita Sells, MD   50 mg at 03/01/22 2297     Discharge Medications: Please see discharge summary for a list of discharge medications.  Relevant Imaging Results:  Relevant Lab Results:   Additional Information [id number] will be on IV [id number] and [place], LCSW

## 2022-03-02 NOTE — Discharge Summary (Signed)
Physician Discharge Summary  Heather Petty OHY:[id number] DOB: 03-09-1950 DOA: 02/23/2022  PCP: Merryl Hacker, No  Admit date: 02/23/2022 Discharge date: 03/02/2022  Admitted From: [facility] Disposition: Skilled nursing facility  Recommendations for Outpatient Follow-up:  Follow up with PCP in 1-2 weeks Please obtain CPK /BMP/CBC once weekly while on antibiotics Please arrange follow-up with her dermatologist that accepts her insurance Follow-up with oncology Dr. Lorenso Courier Home Health: None Equipment/Devices: None Discharge Condition: Stable CODE STATUS: Full code Diet recommendation: Cardiac Brief/Interim Summary: 72 year old female with history of recent diagnosis of Hodgkin's lymphoma, type 2 diabetes, stroke, NASH,, asthma, schizophrenia, history of MRSA, Enterococcus bacteremia, recent admissions for febrile neutropenia of unclear etiology. Port-A-Cath placed 01/10/2022 Port-A-Cath removed 02/28/2022 for possible line infection even though blood cultures were negative. Patient now with diffuse skin excoriation?  Unclear reason  Discharge Diagnoses:  Principal Problem:   Neutropenic fever ([place])  1 febrile neutropenia-?  Secondary to infected port removed 02/28/2022.  Seen by infectious disease.  Recommending IV Rocephin and daptomycin for 2 weeks even though blood cultures have been negative.   2 superficial skin excoriations-unclear reason Discussed with Dr. Lorenso Courier does not think this is from [place] Will likely need a skin biopsy at some point Please send her to the dermatologist who accepts her insurance.   3 decubitus sacral continue wound care recommendations   4 thrush continue nystatin swish and spit   5 pancytopenia-improving.  On the day of discharge her white count is 5.3 hemoglobin 9.6 platelet count is 107. Follow-up CBC q. weekly   6 hypertension on Coreg and amlodipine and lisinopril   7 history of type 2 diabetes with hyperglycemia patient not eating hold glipizide  continue long-acting insulin Stopped glipizide on discharge CBG (last 3)  Recent Labs (last 2 labs)       Recent Labs    02/28/22 2105 03/01/22 0740 03/01/22 1200  GLUCAP 125* 132* 166*          9 itching continue Atarax and calamine lotion   10 hypokalemia repleted   11 gout on allopurinol Pressure Injury 02/27/22 Sacrum Mid Stage 3 -  Full thickness tissue loss. Subcutaneous fat may be visible but bone, tendon or muscle are NOT exposed. (Active)  02/27/22 0551  Location: Sacrum  Location Orientation: Mid  Staging: Stage 3 -  Full thickness tissue loss. Subcutaneous fat may be visible but bone, tendon or muscle are NOT exposed.  Wound Description (Comments):   Present on Admission:   Dressing Type Other (Comment) 03/01/22 2150    Estimated body mass index is 44.4 kg/m as calculated from the following:   Height as of this encounter: '5\' 1"'$  (1.549 m).   Weight as of this encounter: 106.6 kg.  Discharge Instructions  Discharge Instructions     [facility] pharmacist to adjust dose for Vancomycin, Aminoglycosides and other anti-infective therapies as requested by physician.   Complete by: As directed    [facility] to provide Cath Flo '2mg'$    Complete by: As directed    Administer for PICC line occlusion and as ordered by physician for other access device issues.   Anaphylaxis Kit: Provided to treat any anaphylactic reaction to the medication being provided to the patient if First Dose or when requested by physician   Complete by: As directed    Epinephrine '1mg'$ /ml vial / amp: Administer 0.'3mg'$  (0.62m) subcutaneously once for moderate to severe anaphylaxis, nurse to call physician and pharmacy when reaction occurs and call 911 if needed for  immediate care   Diphenhydramine '50mg'$ /ml IV vial: Administer 25-'50mg'$  IV/IM PRN for first dose reaction, rash, itching, mild reaction, nurse to call physician and pharmacy when reaction occurs   Sodium Chloride 0.9% NS  544m IV: Administer if needed for hypovolemic blood pressure drop or as ordered by physician after call to physician with anaphylactic reaction   Change dressing on IV access line weekly and PRN   Complete by: As directed    Diet - low sodium heart healthy   Complete by: As directed    Discharge wound care:   Complete by: As directed    Wound care to sacral area cleanse with normal saline pat gently dry.  Apply Xeroform gauze and single-layer to the wound top with silicone foam and change daily and as needed soiling   Flush IV access with Sodium Chloride 0.9% and Heparin 10 units/ml or 100 units/ml   Complete by: As directed    Home infusion instructions - [facility]   Complete by: As directed    Instructions: Flush IV access with Sodium Chloride 0.9% and Heparin 10units/ml or 100units/ml   Change dressing on IV access line: Weekly and PRN   Instructions Cath Flo '2mg'$ : Administer for PICC Line occlusion and as ordered by physician for other access device   [facility] pharmacist to adjust dose for: Vancomycin, Aminoglycosides and other anti-infective therapies as requested by physician   Increase activity slowly   Complete by: As directed    Method of administration may be changed at the discretion of home infusion pharmacist based upon assessment of the patient and/or caregiver's ability to self-administer the medication ordered   Complete by: As directed    Outpatient Parenteral Antibiotic Therapy Information Antibiotic: Ceftriaxone (Rocephin) IVPB, Daptomycin (Cubicin) IVPB; Indications for use: port infection; End Date: 03/14/2022   Complete by: As directed    Antibiotic:  Ceftriaxone (Rocephin) IVPB Daptomycin (Cubicin) IVPB     Indications for use: port infection   End Date: 03/14/2022      Allergies as of 03/02/2022       Reactions   Emend [aprepitant] Other (See Comments)   Pt experienced shooting back pain at 10/10 when medication was administered the  first time. See Hypersensitivity note from 01/17/2022.   Vancomycin Other (See Comments)   Patient becomes hypotensive, lethargic and itchy. Has tolerated it many times.   Fosaprepitant Other (See Comments)   Back pain / hypersensitivity 01/17/2022   Lasix [furosemide] Other (See Comments)   IV-LASIX ==> Reaction: Burning   Lasix [furosemide] Other (See Comments)   On MAR        Medication List     STOP taking these medications    glipiZIDE 10 MG 24 hr tablet Commonly known as: GLUCOTROL XL       TAKE these medications    acetaminophen 500 MG tablet Commonly known as: TYLENOL Take 1,000 mg by mouth 3 (three) times daily as needed for moderate pain.   albuterol 108 (90 Base) MCG/ACT inhaler Commonly known as: VENTOLIN HFA Inhale 2 puffs into the lungs every 4 (four) hours as needed for wheezing or shortness of breath.   allopurinol 100 MG tablet Commonly known as: ZYLOPRIM Take 100 mg by mouth daily.   amLODipine 10 MG tablet Commonly known as: NORVASC Take 10 mg by mouth daily.   calamine lotion Apply 1 Application topically 2 (two) times daily.   carvedilol 6.25 MG tablet Commonly known as: COREG Take 6.25 mg by mouth 2 (  two) times daily with a meal.   cefTRIAXone  IVPB Commonly known as: ROCEPHIN Inject 2 g into the vein daily. Indication:  port infection First Dose: No Last Day of Therapy:  03/14/2022 Labs - Once weekly:  CBC/D and BMP, Labs - Every other week:  ESR and CRP Method of administration: IV Push Method of administration may be changed at the discretion of home infusion pharmacist based upon assessment of the patient and/or caregiver's ability to self-administer the medication ordered.   cholecalciferol 25 MCG (1000 UNIT) tablet Commonly known as: VITAMIN D3 Take 2,000 Units by mouth daily.   daptomycin  IVPB Commonly known as: CUBICIN Inject 500 mg into the vein daily for 12 days. Indication:  port infection First Dose: No Last Day of  Therapy:  03/14/2022 Labs - Once weekly:  CBC/D, BMP, and CPK Labs - Every other week:  ESR and CRP Method of administration: IV Push Method of administration may be changed at the discretion of home infusion pharmacist based upon assessment of the patient and/or caregiver's ability to self-administer the medication ordered.   doxepin 25 MG capsule Commonly known as: SINEQUAN Take 25 mg by mouth at bedtime.   eucerin cream Apply 1 Application topically 2 (two) times daily. Where skin is peeling   famotidine 20 MG tablet Commonly known as: PEPCID Take 20 mg by mouth 2 (two) times daily.   fluticasone 50 MCG/ACT nasal spray Commonly known as: FLONASE Place 1 spray into both nostrils daily.   Gas-X Ultra Strength 180 MG Caps Generic drug: Simethicone Take 1 capsule by mouth every 8 (eight) hours as needed (gas).   Gerhardt's butt cream Crea Apply 1 Application topically 4 (four) times daily.   guaiFENesin 600 MG 12 hr tablet Commonly known as: MUCINEX Take 600 mg by mouth every 12 (twelve) hours.   hydrOXYzine 25 MG tablet Commonly known as: ATARAX Take 25 mg by mouth in the morning and at bedtime.   insulin aspart 100 UNIT/ML injection Commonly known as: NovoLOG Inject 0-12 Units into the skin See admin instructions. Inject 0-12 units into the skin 4 times a day (before meals and at bedtime) per sliding scale: BGL <60 or >400  = CALL MD; 60-200 = 0 units; 201-250 = 2 units; 251-300 = 4 units; 301-350 = 6 units; 351-400 = 8 units; 401-450 = 10 units; >450 = 12 units   lactose free nutrition Liqd Take 237 mLs by mouth 2 (two) times daily between meals.   Lantus SoloStar 100 UNIT/ML Solostar Pen Generic drug: insulin glargine Inject 28-38 Units into the skin 2 (two) times daily. Inject 38 units in the morning and Inject 28 units at bedtime   lidocaine 4 % Place 1 patch onto the skin daily.   lidocaine-prilocaine cream Commonly known as: EMLA Apply 1 Application topically  as needed. What changed: reasons to take this   lip balm ointment Apply 1 Application topically as needed for lip care (cold sores).   lisinopril 5 MG tablet Commonly known as: ZESTRIL Take 5 mg by mouth daily.   loperamide 2 MG capsule Commonly known as: IMODIUM Take 1 capsule (2 mg total) by mouth as needed for diarrhea or loose stools.   loratadine 10 MG tablet Commonly known as: CLARITIN Take 10 mg by mouth daily.   miconazole nitrate Powd Commonly known as: MICATIN Apply 1 Application topically 2 (two) times daily.   mouth rinse Liqd solution 15 mLs by Mouth Rinse route as needed (for oral care).  mupirocin ointment 2 % Commonly known as: BACTROBAN Apply 1 Application topically 2 (two) times daily.   nystatin 100000 UNIT/ML suspension Commonly known as: MYCOSTATIN Take 5 mLs (500,000 Units total) by mouth 4 (four) times daily.   polyethylene glycol 17 g packet Commonly known as: MIRALAX / GLYCOLAX Take 17 g by mouth daily as needed for mild constipation.   prochlorperazine 10 MG tablet Commonly known as: COMPAZINE Take 1 tablet (10 mg total) by mouth every 6 (six) hours as needed for nausea or vomiting.   PROSTAT PO Take 30 mLs by mouth in the morning and at bedtime.   Systane Ultra 0.4-0.3 % Soln Generic drug: Polyethyl Glycol-Propyl Glycol Place 2 drops into both eyes in the morning and at bedtime.   traMADol 50 MG tablet Commonly known as: ULTRAM Take 1 tablet (50 mg total) by mouth every 6 (six) hours as needed for moderate pain.   zinc oxide 20 % ointment Apply 1 Application topically See admin instructions. Every shift and as needed (bilateral buttock)               Discharge Care Instructions  (From admission, onward)           Start     Ordered   03/02/22 0000  Change dressing on IV access line weekly and PRN  (Home infusion instructions - [facility] )        03/02/22 1047   03/02/22 0000  Discharge wound care:        Comments: Wound care to sacral area cleanse with normal saline pat gently dry.  Apply Xeroform gauze and single-layer to the wound top with silicone foam and change daily and as needed soiling   03/02/22 1051            Allergies  Allergen Reactions   Emend [Aprepitant] Other (See Comments)    Pt experienced shooting back pain at 10/10 when medication was administered the first time. See Hypersensitivity note from 01/17/2022.   Vancomycin Other (See Comments)    Patient becomes hypotensive, lethargic and itchy. Has tolerated it many times.   Fosaprepitant Other (See Comments)    Back pain / hypersensitivity 01/17/2022   Lasix [Furosemide] Other (See Comments)    IV-LASIX ==> Reaction: Burning   Lasix [Furosemide] Other (See Comments)    On MAR    Consultations:id oncology IR Procedures/Studies: IR REMOVAL TUN ACCESS W/ PORT W/O FL MOD SED  Result Date: 02/28/2022 INDICATION: Patient has a history of Hodgkin's lymphoma admitted for neutropenic fevers. Team is requesting Port-A-Cath removal due to concern for infection. EXAM: REMOVAL OF IMPLANTED TUNNELED PORT-A-CATH MEDICATIONS: LIDOCAINE 1% 5 ML ANESTHESIA/SEDATION: Local anesthetic was administered. FLUOROSCOPY: None PROCEDURE: Informed written consent was obtained from the patient after thorough discussion of the procedure risks, benefits and alternatives. All questions were addressed. Maximum sterile barrier technique was utilized including caps, masks, sterile gowns, sterile gloves, sterile drape, hand hygiene and skin antiseptic. A time-out was performed prior to the initiation of the procedure The RIGHT chest was prepped and draped in a sterile fashion. Lidocaine was utilized for local anesthetic. Port-A-Cath was palpated and an incision was made just superior. Utilizing blunt dissection the Port-A-Cath and reservoir were removed with the underlying subcutaneous tissue and their entirety. The pocket was then cleaned out with  sterile normal saline The pocket was closed with 3 interrupted 4.0 Vicryl sutures and a 4.0 Vicryl running subcuticular stitch was utilized to approximate the skin. Dermabond was applied. Dressing was  applied. The patient tolerated the procedure well without immediate post procedural complication. FINDINGS: Successful removal of implant Port-A-Cath without immediate post procedural complication. IMPRESSION: Successful removal of a RIGHT chest implanted Port-A-Cath. A tip culture was submitted per Infectious Disease request. Read by: Rushie Nyhan, NP Electronically Signed   By: Michaelle Birks M.D.   On: 02/28/2022 16:49   CT Head Wo Contrast  Result Date: 02/24/2022 CLINICAL DATA:  Mental status change, unknown cause EXAM: CT HEAD WITHOUT CONTRAST TECHNIQUE: Contiguous axial images were obtained from the base of the skull through the vertex without intravenous contrast. RADIATION DOSE REDUCTION: This exam was performed according to the departmental dose-optimization program which includes automated exposure control, adjustment of the mA and/or kV according to patient size and/or use of iterative reconstruction technique. COMPARISON:  CT head June 14, 21. FINDINGS: Brain: No evidence of acute infarction, hemorrhage, hydrocephalus, extra-axial collection or mass lesion/mass effect. Vascular: No hyperdense vessel. Skull: No acute fracture. Sinuses/Orbits: Clear sinuses.  No acute orbital findings. Other: No mastoid effusions. IMPRESSION: No evidence of acute intracranial abnormality. Electronically Signed   By: Margaretha Sheffield M.D.   On: 02/24/2022 14:54   CT ABDOMEN PELVIS W CONTRAST  Result Date: 02/23/2022 CLINICAL DATA:  Hodgkin's lymphoma. Sacral wound, fever, hypoxia and lethargy. * Tracking Code: BO * EXAM: CT ABDOMEN AND PELVIS WITH CONTRAST TECHNIQUE: Multidetector CT imaging of the abdomen and pelvis was performed using the standard protocol following bolus administration of intravenous contrast.  RADIATION DOSE REDUCTION: This exam was performed according to the departmental dose-optimization program which includes automated exposure control, adjustment of the mA and/or kV according to patient size and/or use of iterative reconstruction technique. CONTRAST:  148m OMNIPAQUE IOHEXOL 300 MG/ML  SOLN COMPARISON:  02/10/2022 CT scan FINDINGS: Lower chest: Mild linear subsegmental atelectasis in the right lower lobe. Stable focal linear scarring in the lingula. Descending thoracic aortic atherosclerosis. Hepatobiliary: Borderline distention of the gallbladder. Small gallstones are present dependently in the gallbladder. Pancreas: Unremarkable Spleen: There is a anterolateral band of hypodensity extending in the spleen on images 20-25 of series 2 which is new compared to prior exams. Possibilities include heterogeneity related to early contrast phase, a small splenic infarct, a lymphoma related lesion with atypical morphology, or less likely a small splenic laceration. No perisplenic ascites to further suggest recent splenic trauma. Adrenals/Urinary Tract: The urinary bladder extends 1.4 cm below the pubococcygeal line, compatible with pelvic floor laxity and cystocele. The kidneys and adrenal glands appear unremarkable. Stomach/Bowel: Sigmoid colon diverticulosis. Otherwise unremarkable. Vascular/Lymphatic: Atherosclerosis is present, including aortoiliac atherosclerotic disease. Atheromatous plaque at the origins of the celiac trunk and SMA may be causing some degree of stenosis, but both vessels opacify and accordingly no overt occlusion is identified. There is atheromatous plaque proximally in both renal arteries. Retroperitoneal adenopathy is present, index left periaortic lymph node 1.5 cm in short axis on image 41 series 2, formerly the same. Overall no substantive change in the visualized adenopathy. Reproductive: No significant findings Other: No supplemental non-categorized findings. Musculoskeletal:  Dextroconvex lumbar scoliosis with rotary component. Sclerotic Schmorl's nodes and degenerative endplate findings at several lumbar levels. Right eccentric vertical narrowing of the L5 vertebral body as before. Multilevel lumbar impingement due to spondylosis and degenerative disc disease. Focal band of subcutaneous edema extends from the left buttock towards the left posterior iliac bone on image 60 of series 2, with associated overlying cutaneous thickening. This has increased substantially since 02/10/2022 and likely rep reflects the reported decubitus ulcer. Although  this extends faintly towards the periosteal margin, we do not demonstrate a deep gas-filled cavity, and there is no bony demineralization or evidence of underlying bony involvement to suggest osteomyelitis. Along the top of the gluteal cleft, there is also a suspected ulceration or lesion extending down towards the sacrum on images 66 through 72 of series 2, increased from prior. Although this abuts the posterior sacral margin there are no findings of bony demineralization or osteomyelitis. Small radiodensity along the associated wound may represent bandaging. No drainable abscess observed. IMPRESSION: 1. Decubitus ulcers along the left buttock extending towards the left posterior iliac bone, and along the upper margin of the gluteal cleft. Both have edema extending towards the adjacent bony structures but no findings of osteomyelitis or separate drainable abscess. 2. New band of hypodensity extending in the spleen, differential diagnostic considerations include small splenic infarct, heterogeneous enhancement related to early contrast phase, lymphoma related lesion with atypical morphology, or less likely a small splenic laceration. No perisplenic ascites to further suggest recent splenic trauma. 3. Stable retroperitoneal adenopathy. 4. Pelvic floor laxity and cystocele. 5. Sigmoid colon diverticulosis. 6. Cholelithiasis. 7. Atheromatous plaque at  the origins of the celiac trunk and SMA may be causing some degree of stenosis, but both vessels opacify and accordingly no overt occlusion is identified. 8. Dextroconvex lumbar scoliosis with rotary component. Multilevel lumbar impingement due to spondylosis and degenerative disc disease. 9. Aortic atherosclerosis. Aortic Atherosclerosis (ICD10-I70.0). Electronically Signed   By: Van Clines M.D.   On: 02/23/2022 15:03   DG Chest Port 1 View  Result Date: 02/23/2022 CLINICAL DATA:  Questionable sepsis - evaluate for abnormality EXAM: PORTABLE CHEST 1 VIEW COMPARISON:  February 10, 2022 FINDINGS: The cardiomediastinal silhouette is unchanged in contour.RIGHT chest port with tip terminating over the superior cavoatrial junction. Atherosclerotic calcifications. No pleural effusion. No pneumothorax. No acute pleuroparenchymal abnormality. IMPRESSION: No acute cardiopulmonary abnormality. Electronically Signed   By: Valentino Saxon M.D.   On: 02/23/2022 13:59   US Abdomen Limited RUQ (LIVER/GB)  Result Date: 02/10/2022 CLINICAL DATA:  [id number] Abdominal pain [id number] EXAM: ULTRASOUND ABDOMEN LIMITED COMPARISON:  11/06/2019 FINDINGS: The liver demonstrates increased parenchymal echogenicity consistent with fatty infiltration. No focal hepatic parenchymal lesions or intrahepatic ductal dilatation. Layering small shadowing gallstones. No Wall thickening or pericholecystic fluid. CBD measured 0.5cm. IMPRESSION: Hepatic fatty infiltration.  Cholelithiasis. Electronically Signed   By: Sammie Bench M.D.   On: 02/10/2022 22:47   CT ABDOMEN PELVIS W CONTRAST  Result Date: 02/10/2022 CLINICAL DATA:  Hodgkin's lymphoma, vomiting EXAM: CT ABDOMEN AND PELVIS WITH CONTRAST TECHNIQUE: Multidetector CT imaging of the abdomen and pelvis was performed using the standard protocol following bolus administration of intravenous contrast. RADIATION DOSE REDUCTION: This exam was performed according to the departmental  dose-optimization program which includes automated exposure control, adjustment of the mA and/or kV according to patient size and/or use of iterative reconstruction technique. CONTRAST:  100 mL OMNIPAQUE IOHEXOL 300 MG/ML  SOLN COMPARISON:  12/09/2021 and 07/21/2019. FINDINGS: Lower chest: Atheromatous changes of coronary arteries and aorta. No pleural or pericardial effusion. Stable scarring identified lingula with a 1 cm density noted to be stable. Hepatobiliary: No lesions identified in the liver. No intrahepatic ductal dilatation. Gallbladder contains small calcified stones or layering milk of calcium. Pancreas: Fatty atrophy.  No focal lesions identified Spleen: Normal in size without focal abnormality. Adrenals/Urinary Tract: Adrenal glands are unremarkable. Kidneys are normal, without renal calculi, focal lesion, or hydronephrosis. Bladder is unremarkable. Stomach/Bowel: No focal abnormalities are  identified in the stomach. No bowel dilatation to suggest obstruction. Normal appendix. Diverticulosis descending and sigmoid colon. Vascular/Lymphatic: Dense atheromatous calcifications of the aorta and its branches. Para-aortic adenopathy identified with the largest lesion now measuring 3.0 x 1.6 cm compared to 3.6 x 2.1 cm previously. Additional retroperitoneal nodes diminished in size and number compared to prior study. Reproductive: Uterus and bilateral adnexa are unremarkable. Other: No abdominal wall hernia or abnormality. No abdominopelvic ascites. Musculoskeletal: Extensive thoracolumbosacral degenerative changes. IMPRESSION: 1. Hypermetabolic retroperitoneal adenopathy less prominent than on prior study. 2. Diverticulosis. 3. No acute abdominal or pelvic pathology was identified. Electronically Signed   By: Sammie Bench M.D.   On: 02/10/2022 21:56   DG Chest Port 1 View  Result Date: 02/10/2022 CLINICAL DATA:  History of breast cancer and stomach cancer presenting with vomiting. EXAM: PORTABLE CHEST  1 VIEW COMPARISON:  September 04, 2019 FINDINGS: There is stable right-sided venous Port-A-Cath positioning. The heart size and mediastinal contours are within normal limits. There is moderate severity calcification of the aortic arch. There is no evidence of acute infiltrate, pleural effusion or pneumothorax. The visualized skeletal structures are unremarkable. IMPRESSION: No active cardiopulmonary disease. Electronically Signed   By: Virgina Norfolk M.D.   On: 02/10/2022 21:45   (Echo, Carotid, EGD, Colonoscopy, ERCP)    Subjective:  Patient resting in bed She appears comfortable today than yesterday Discharge Exam: Vitals:   03/02/22 0435 03/02/22 0925  BP: (!) 127/59 125/60  Pulse: 82   Resp: 20 20  Temp:    SpO2: 99%    Vitals:   03/01/22 2045 03/02/22 0432 03/02/22 0435 03/02/22 0925  BP: (!) 154/65  (!) 127/59 125/60  Pulse: 100  82   Resp: (!) '22  20 20  '$ Temp: 99.7 F (37.6 C)     TempSrc: Oral     SpO2: 100%  99%   Weight:  106.6 kg    Height:        General: Pt is alert, awake, not in acute distress Cardiovascular: RRR, S1/S2 +, no rubs, no gallops Respiratory: CTA bilaterally, no wheezing, no rhonchi Abdominal: Soft, NT, ND, bowel sounds + Extremities: no edema, no cyanosis    The results of significant diagnostics from this hospitalization (including imaging, microbiology, ancillary and laboratory) are listed below for reference.     Microbiology: Recent Results (from the past 240 hour(s))  Resp panel by RT-PCR (RSV, Flu A&B, Covid) Anterior Nasal Swab     Status: None   Collection Time: 02/23/22 12:12 PM   Specimen: Anterior Nasal Swab  Result Value Ref Range Status   SARS Coronavirus 2 by RT PCR NEGATIVE NEGATIVE Final    Comment: (NOTE) SARS-CoV-2 target nucleic acids are NOT DETECTED.  The SARS-CoV-2 RNA is generally detectable in upper respiratory specimens during the acute phase of infection. The lowest concentration of SARS-CoV-2 viral copies this  assay can detect is 138 copies/mL. A negative result does not preclude SARS-Cov-2 infection and should not be used as the sole basis for treatment or other patient management decisions. A negative result may occur with  improper specimen collection/handling, submission of specimen other than nasopharyngeal swab, presence of viral mutation(s) within the areas targeted by this assay, and inadequate number of viral copies(<138 copies/mL). A negative result must be combined with clinical observations, patient history, and epidemiological information. The expected result is Negative.  Fact Sheet for Patients:  EntrepreneurPulse.com.au  Fact Sheet for Healthcare Providers:  IncredibleEmployment.be  This test is no t yet  approved or cleared by the [place] and  has been authorized for detection and/or diagnosis of SARS-CoV-2 by FDA under an Emergency Use Authorization (EUA). This EUA will remain  in effect (meaning this test can be used) for the duration of the COVID-19 declaration under Section 564(b)(1) of the Act, 21 U.S.C.section 360bbb-3(b)(1), unless the authorization is terminated  or revoked sooner.       Influenza A by PCR NEGATIVE NEGATIVE Final   Influenza B by PCR NEGATIVE NEGATIVE Final    Comment: (NOTE) The Xpert Xpress SARS-CoV-2/FLU/RSV plus assay is intended as an aid in the diagnosis of influenza from Nasopharyngeal swab specimens and should not be used as a sole basis for treatment. Nasal washings and aspirates are unacceptable for Xpert Xpress SARS-CoV-2/FLU/RSV testing.  Fact Sheet for Patients: EntrepreneurPulse.com.au  Fact Sheet for Healthcare Providers: IncredibleEmployment.be  This test is not yet approved or cleared by the [place] FDA and has been authorized for detection and/or diagnosis of SARS-CoV-2 by FDA under an Emergency Use Authorization (EUA). This EUA will  remain in effect (meaning this test can be used) for the duration of the COVID-19 declaration under Section 564(b)(1) of the Act, 21 U.S.C. section 360bbb-3(b)(1), unless the authorization is terminated or revoked.     Resp Syncytial Virus by PCR NEGATIVE NEGATIVE Final    Comment: (NOTE) Fact Sheet for Patients: EntrepreneurPulse.com.au  Fact Sheet for Healthcare Providers: IncredibleEmployment.be  This test is not yet approved or cleared by the [place] FDA and has been authorized for detection and/or diagnosis of SARS-CoV-2 by FDA under an Emergency Use Authorization (EUA). This EUA will remain in effect (meaning this test can be used) for the duration of the COVID-19 declaration under Section 564(b)(1) of the Act, 21 U.S.C. section 360bbb-3(b)(1), unless the authorization is terminated or revoked.  Performed at [facility], [place] [address] [place], [place] [address]   Blood Culture (routine x 2)     Status: None   Collection Time: 02/23/22 12:25 PM   Specimen: BLOOD  Result Value Ref Range Status   Specimen Description   Final    BLOOD LEFT ANTECUBITAL Performed at [place] [address] [place], [place] [address]    Special Requests   Final    BOTTLES DRAWN AEROBIC AND ANAEROBIC Blood Culture results may not be optimal due to an excessive volume of blood received in culture bottles Performed at [place] [address] [place], [place] [address]    Culture   Final    NO GROWTH 5 DAYS Performed at [facility], [place] [address] [place], [place] [address]    Report Status 02/28/2022 FINAL  Final  Blood Culture (routine x 2)     Status: None   Collection Time: 02/23/22 12:40 PM   Specimen: BLOOD  Result Value Ref Range Status   Specimen Description   Final    BLOOD RIGHT PORTA CATH Performed at [place] [address]  [place], [place] [address]    Special Requests   Final    BOTTLES DRAWN AEROBIC AND ANAEROBIC Blood Culture results may not be optimal due to an excessive volume of blood received in culture bottles Performed at [place] [address] [place], [place] [address]    Culture   Final    NO GROWTH 5 DAYS Performed at [facility], [place] [address] [place], [place] [address]  Report Status 02/28/2022 FINAL  Final  Urine Culture     Status: None   Collection Time: 02/23/22  1:22 PM   Specimen: In/Out Cath Urine  Result Value Ref Range Status   Specimen Description   Final    IN/OUT CATH URINE Performed at [place] [address] [place], [place] [address]    Special Requests   Final    NONE Performed at [facility], [place] [address], [place], [place] [address]    Culture   Final    NO GROWTH Performed at [facility], [place] [address], [place], [place] [address]    Report Status 02/24/2022 FINAL  Final  Cath Tip Culture     Status: None (Preliminary result)   Collection Time: 02/28/22 12:18 PM   Specimen: Other Source  Result Value Ref Range Status   Specimen Description   Final    OTHER Performed at [place] [address], [place], [place] [address]    Special Requests   Final    CATH TIP CULTURE Performed at [facility], [place] [address], [place], [place] [address]    Culture   Final    NO GROWTH 2 DAYS Performed at [place] [address], [place], [place] [address]    Report Status PENDING  Incomplete  Culture, blood (Routine X 2) w Reflex to ID Panel     Status: None (Preliminary result)   Collection Time: 02/28/22  1:04 PM   Specimen: BLOOD RIGHT ARM  Result Value Ref Range Status   Specimen Description   Final    BLOOD RIGHT ARM Performed at [facility], [place] [address], [place], [place] [address]    Special Requests   Final     BOTTLES DRAWN AEROBIC ONLY Blood Culture adequate volume Performed at [place] [address], [place], [place] [address]    Culture   Final    NO GROWTH 2 DAYS Performed at [place] [address], [place], [place] [address]    Report Status PENDING  Incomplete  Culture, blood (Routine X 2) w Reflex to ID Panel     Status: None (Preliminary result)   Collection Time: 02/28/22  1:04 PM   Specimen: BLOOD LEFT HAND  Result Value Ref Range Status   Specimen Description   Final    BLOOD LEFT HAND Performed at [facility], [place] [address], [place], [place] [address]    Special Requests   Final    BOTTLES DRAWN AEROBIC ONLY Blood Culture adequate volume Performed at [place] [address], [place], [place] [address]    Culture   Final    NO GROWTH 2 DAYS Performed at [facility] [address] [place],  [address]    Report Status PENDING  Incomplete     Labs: BNP (last 3 results) No results for input(s): "BNP" in the last 8760 hours. Basic Metabolic Panel: Recent Labs  Lab 02/25/22 0314 02/26/22 0436 02/27/22 0401 03/01/22 0422 03/02/22 0450  NA 135 139 135 139 137  K 3.1* 2.9* 3.8 3.3* 3.4*  CL 105 109 108 107 102  CO2 '23 24 22 24 23  '$ GLUCOSE 191* 146* 96 154* 203*  BUN 30* 25* '21 12 13  '$ CREATININE 0.94 0.87 0.77 0.69 0.73  CALCIUM 7.7* 7.8* 7.5* 8.0* 7.9*   Liver Function Tests: Recent Labs  Lab 02/25/22 0314 02/26/22 0436 02/27/22 0401 03/01/22 0422 03/02/22 0450  AST 37 49* 16 41 24  ALT 32 44 12 64* 44  ALKPHOS 93 143* 60 355* 355*  BILITOT 1.2 1.0 0.2* 1.0 0.9  PROT 5.0* 5.0* 5.7* 5.3* 5.4*  ALBUMIN 2.0* 1.9* 2.0* 2.3* 2.5*   No results for input(s): "LIPASE", "AMYLASE" in the last 168 hours. Recent Labs  Lab 02/23/22 1915  AMMONIA 10   CBC: Recent Labs  Lab 02/25/22 0935 02/26/22 0436 02/27/22 0401 02/28/22 0318 03/01/22 0422 03/02/22 0450  WBC 0.6* 1.1* 7.1  5.3 7.6 5.3  NEUTROABS 0.1*  --  4.6 1.9 3.1 3.6  HGB 7.3* 8.4* 7.6* 8.5* 9.4* 9.6*  HCT 22.3* 24.9* 24.2* 25.6* 29.3* 29.8*  MCV 91.0 89.6 96.8 90.8 92.1 93.4  PLT 63* 77* 426* 87* 109* 107*   Cardiac Enzymes: Recent Labs  Lab 03/02/22 0450  CKTOTAL 19*   BNP: Invalid input(s): "POCBNP" CBG: Recent Labs  Lab 03/01/22 0740 03/01/22 1200 03/01/22 1702 03/01/22 2042 03/02/22 0801  GLUCAP 132* 166* 158* 191* 178*   D-Dimer No results for input(s): "DDIMER" in the last 72 hours. Hgb A1c No results for input(s): "HGBA1C" in the last 72 hours. Lipid Profile No results for input(s): "CHOL", "HDL", "LDLCALC", "TRIG", "CHOLHDL", "LDLDIRECT" in the last 72 hours. Thyroid function studies No results for input(s): "TSH", "T4TOTAL", "T3FREE", "THYROIDAB" in the last 72 hours.  Invalid input(s): "FREET3" Anemia work up No results for input(s): "VITAMINB12", "FOLATE", "FERRITIN", "TIBC", "IRON", "RETICCTPCT" in the last 72 hours. Urinalysis    Component Value Date/Time   COLORURINE AMBER (A) 02/23/2022 1322   APPEARANCEUR CLEAR 02/23/2022 1322   LABSPEC 1.019 02/23/2022 1322   PHURINE 5.0 02/23/2022 1322   GLUCOSEU 50 (A) 02/23/2022 1322   HGBUR NEGATIVE 02/23/2022 1322   BILIRUBINUR NEGATIVE 02/23/2022 1322   KETONESUR NEGATIVE 02/23/2022 1322   PROTEINUR 100 (A) 02/23/2022 1322   UROBILINOGEN 0.2 12/27/2010 1858   NITRITE NEGATIVE 02/23/2022 1322   LEUKOCYTESUR NEGATIVE 02/23/2022 1322   Sepsis Labs Recent Labs  Lab 02/27/22 0401 02/28/22 0318 03/01/22 0422 03/02/22 0450  WBC 7.1 5.3 7.6 5.3   Microbiology Recent Results (from the past 240 hour(s))  Resp panel by RT-PCR (RSV, Flu A&B, Covid) Anterior Nasal Swab     Status: None   Collection Time: 02/23/22 12:12 PM   Specimen: Anterior Nasal Swab  Result Value Ref Range Status   SARS Coronavirus 2 by RT PCR NEGATIVE NEGATIVE Final    Comment: (NOTE) SARS-CoV-2 target nucleic acids are NOT DETECTED.  The  SARS-CoV-2 RNA is generally detectable in upper respiratory specimens during the acute phase of infection. The lowest concentration of SARS-CoV-2 viral copies this assay can detect is 138 copies/mL. A negative result does not preclude SARS-Cov-2 infection and should not be used as the sole basis for treatment or other patient management decisions. A negative result may occur with  improper specimen collection/handling, submission of specimen other than nasopharyngeal swab, presence of viral mutation(s) within the areas targeted by this assay, and inadequate number of viral copies(<138 copies/mL). A negative result must be combined with clinical observations, patient history, and epidemiological information. The expected result is Negative.  Fact Sheet for Patients:  EntrepreneurPulse.com.au  Fact Sheet for Healthcare Providers:  IncredibleEmployment.be  This test is no t yet approved or cleared by the [place] FDA and  has been authorized for detection and/or diagnosis of SARS-CoV-2 by FDA under an Emergency Use Authorization (EUA). This  EUA will remain  in effect (meaning this test can be used) for the duration of the COVID-19 declaration under Section 564(b)(1) of the Act, 21 U.S.C.section 360bbb-3(b)(1), unless the authorization is terminated  or revoked sooner.       Influenza A by PCR NEGATIVE NEGATIVE Final   Influenza B by PCR NEGATIVE NEGATIVE Final    Comment: (NOTE) The Xpert Xpress SARS-CoV-2/FLU/RSV plus assay is intended as an aid in the diagnosis of influenza from Nasopharyngeal swab specimens and should not be used as a sole basis for treatment. Nasal washings and aspirates are unacceptable for Xpert Xpress SARS-CoV-2/FLU/RSV testing.  Fact Sheet for Patients: EntrepreneurPulse.com.au  Fact Sheet for Healthcare Providers: IncredibleEmployment.be  This test is not yet approved or  cleared by the [place] FDA and has been authorized for detection and/or diagnosis of SARS-CoV-2 by FDA under an Emergency Use Authorization (EUA). This EUA will remain in effect (meaning this test can be used) for the duration of the COVID-19 declaration under Section 564(b)(1) of the Act, 21 U.S.C. section 360bbb-3(b)(1), unless the authorization is terminated or revoked.     Resp Syncytial Virus by PCR NEGATIVE NEGATIVE Final    Comment: (NOTE) Fact Sheet for Patients: EntrepreneurPulse.com.au  Fact Sheet for Healthcare Providers: IncredibleEmployment.be  This test is not yet approved or cleared by the [place] FDA and has been authorized for detection and/or diagnosis of SARS-CoV-2 by FDA under an Emergency Use Authorization (EUA). This EUA will remain in effect (meaning this test can be used) for the duration of the COVID-19 declaration under Section 564(b)(1) of the Act, 21 U.S.C. section 360bbb-3(b)(1), unless the authorization is terminated or revoked.  Performed at [facility], [place] [address] [place], [place] [address]   Blood Culture (routine x 2)     Status: None   Collection Time: 02/23/22 12:25 PM   Specimen: BLOOD  Result Value Ref Range Status   Specimen Description   Final    BLOOD LEFT ANTECUBITAL Performed at [place] [address] [place], [place] [address]    Special Requests   Final    BOTTLES DRAWN AEROBIC AND ANAEROBIC Blood Culture results may not be optimal due to an excessive volume of blood received in culture bottles Performed at [place] [address], [place], [place] [address]    Culture   Final    NO GROWTH 5 DAYS Performed at [facility], [place] [address], [place], [place] [address]    Report Status 02/28/2022 FINAL  Final  Blood Culture (routine x 2)     Status: None   Collection Time: 02/23/22 12:40 PM   Specimen:  BLOOD  Result Value Ref Range Status   Specimen Description   Final    BLOOD RIGHT PORTA CATH Performed at [place] [address], [place], [place] [address]    Special Requests   Final    BOTTLES DRAWN AEROBIC AND ANAEROBIC Blood Culture results may not be optimal due to an excessive volume of blood received in culture bottles Performed at [place] [address] [place], [place] [address]    Culture   Final    NO GROWTH 5 DAYS Performed at [facility], [place] [address] [place], [place] [address]    Report Status 02/28/2022 FINAL  Final  Urine Culture     Status: None   Collection Time: 02/23/22  1:22 PM   Specimen: In/Out Cath  Urine  Result Value Ref Range Status   Specimen Description   Final    IN/OUT CATH URINE Performed at [facility], [place] [address] [place], [place] [address]    Special Requests   Final    NONE Performed at [facility], [place] [address], [place], [place] [address]    Culture   Final    NO GROWTH Performed at [facility], [place] [address] [place], [place] [address]    Report Status 02/24/2022 FINAL  Final  Cath Tip Culture     Status: None (Preliminary result)   Collection Time: 02/28/22 12:18 PM   Specimen: Other Source  Result Value Ref Range Status   Specimen Description   Final    OTHER Performed at [place] [address], [place], [place] [address]    Special Requests   Final    CATH TIP CULTURE Performed at [facility], [place] [address] [place], [place] [address]    Culture   Final    NO GROWTH 2 DAYS Performed at [place] [address], [place], [place] [address]    Report Status PENDING  Incomplete  Culture, blood (Routine X 2) w Reflex to ID Panel     Status: None (Preliminary result)   Collection Time: 02/28/22  1:04 PM   Specimen: BLOOD RIGHT ARM  Result Value Ref Range Status    Specimen Description   Final    BLOOD RIGHT ARM Performed at [facility], [place] [address] [place], [place] [address]    Special Requests   Final    BOTTLES DRAWN AEROBIC ONLY Blood Culture adequate volume Performed at [place] [address], [place], [place] [address]    Culture   Final    NO GROWTH 2 DAYS Performed at [place] [address], [place], [place] [address]    Report Status PENDING  Incomplete  Culture, blood (Routine X 2) w Reflex to ID Panel     Status: None (Preliminary result)   Collection Time: 02/28/22  1:04 PM   Specimen: BLOOD LEFT HAND  Result Value Ref Range Status   Specimen Description   Final    BLOOD LEFT HAND Performed at [facility], [place] [address] [place], [place] [address]    Special Requests   Final    BOTTLES DRAWN AEROBIC ONLY Blood Culture adequate volume Performed at [place] [address], [place], [place] [address]    Culture   Final    NO GROWTH 2 DAYS Performed at [place] [address] [place], [place] [address]    Report Status PENDING  Incomplete     Time coordinating discharge: 38 minutes  SIGNED:  Georgette Shell, MD  [facility] 03/02/2022, 10:51 AM

## 2022-03-02 NOTE — Progress Notes (Addendum)
[facility] for Infectious Disease  Date of Admission:  02/23/2022   Total days of inpatient antibiotics 7  Principal Problem:   Neutropenic fever (HCC)          Assessment: 71 YM with Hodgkin's  lymphoma via chest port placed on 12/5 with PRBC infusion day prior to admission, recent admission 1/5-1/8 for neutropenic fever 2/2 unclear source discharged on Augmentin x 5 days admitted with neutropenic fever. Neutropenia has resolved, ID engaged for abx recommendation.      #Neutropenic fever 2/2 suspected port infection #Skin excoriations #Decubitus ulcer  -I examined her skin and decubitus wound and did not appreciate any skin soft tissue infection. But my exam is a few days into hospitalization. -Given this is pt's second episode of neutropenic fever with unclear source since port was placed on 12/5 would remove it. I realize blood Cx have been negative and no signs of local infection at port site but given the temporal relationship of port and fever , cath removed. Plan on 2 weeks of abx from cath removal. Cx form 1/23 cath tip and blood Cx (after cath removal)NG. Recommendations: -Blood Cx on 1/23 NG x 2d - OK to place PICC form ID perspective -Continue dapto and ceftriaxone x 2 weeks from port removal(EOT 03/14/22) -F/U with ID on 2/6  ID will sign off.   OPAT ORDERS:  Diagnosis: Fever, suspect port infction  Culture Result: Blood and cath tip Cx NG  Allergies  Allergen Reactions   Emend [Aprepitant] Other (See Comments)    Pt experienced shooting back pain at 10/10 when medication was administered the first time. See Hypersensitivity note from 01/17/2022.   Vancomycin Other (See Comments)    Patient becomes hypotensive, lethargic and itchy. Has tolerated it many times.   Fosaprepitant Other (See Comments)    Back pain / hypersensitivity 01/17/2022   Lasix [Furosemide] Other (See Comments)    IV-LASIX ==> Reaction: Burning   Lasix [Furosemide] Other (See  Comments)    On MAR     Discharge antibiotics to be given via PICC line:  Per pharmacy protocol Daptomycin '8mg'$ /kg/day, ctx 2gm qd   Duration: 2 weeks End Date: 03/14/22  [facility] Care Per Protocol with Biop[employer] Use: Home health RN for IV administration and teaching, line care and labs.    Labs weekly while on IV antibiotics: __x CBC with differential __x CMP _x_ CRP _x_ ESR _x_ CK  _x_ Please leave PIC in place until doctor has seen patient or been notified  Fax weekly labs to [phone]  Clinic Follow Up Appt: 2/6  @ [facility] with Dr. Candiss Norse   Microbiology:   Antibiotics: Vancomycin and cefepime 1/18-22 Augmentin and doxy now   Cultures: Blood 1/23 NG 1/23 cath tip Cx NG    SUBJECTIVE: Resting in bed. No new complaints.  Interval: Afebrile overnight. No new complaints.   Review of Systems: Review of Systems  All other systems reviewed and are negative.    Scheduled Meds:  allopurinol  100 mg Oral Daily   amLODipine  10 mg Oral Daily   calamine  1 Application Topical BID   carvedilol  6.25 mg Oral BID WC   Chlorhexidine Gluconate Cloth  6 each Topical Daily   cholecalciferol  2,000 Units Oral Daily   fluticasone  1 spray Each Nare Daily   Gerhardt's butt cream   Topical QID   hydrOXYzine  25 mg Oral TID   insulin aspart  0-6  Units Subcutaneous TID WC   lactose free nutrition  237 mL Oral BID BM   lisinopril  5 mg Oral Daily   loratadine  10 mg Oral Daily   miconazole nitrate   Topical BID   nystatin  5 mL Oral QID   sodium chloride flush  10-40 mL Intracatheter Q12H   Continuous Infusions:  cefTRIAXone (ROCEPHIN)  IV 2 g (03/02/22 1228)   DAPTOmycin (CUBICIN) 500 mg in sodium chloride 0.9 % IVPB Stopped (03/01/22 1639)   lactated ringers Stopped (03/01/22 1444)   PRN Meds:.acetaminophen, albuterol, lip balm, mouth rinse, prochlorperazine, sodium chloride flush, traMADol Allergies  Allergen Reactions   Emend [Aprepitant] Other (See Comments)     Pt experienced shooting back pain at 10/10 when medication was administered the first time. See Hypersensitivity note from 01/17/2022.   Vancomycin Other (See Comments)    Patient becomes hypotensive, lethargic and itchy. Has tolerated it many times.   Fosaprepitant Other (See Comments)    Back pain / hypersensitivity 01/17/2022   Lasix [Furosemide] Other (See Comments)    IV-LASIX ==> Reaction: Burning   Lasix [Furosemide] Other (See Comments)    On MAR    OBJECTIVE: Vitals:   03/01/22 2045 03/02/22 0432 03/02/22 0435 03/02/22 0925  BP: (!) 154/65  (!) 127/59 125/60  Pulse: 100  82   Resp: (!) '22  20 20  '$ Temp: 99.7 F (37.6 C)     TempSrc: Oral     SpO2: 100%  99%   Weight:  106.6 kg    Height:       Body mass index is 44.4 kg/m.  Physical Exam Constitutional:      Appearance: Normal appearance.  HENT:     Head: Normocephalic and atraumatic.     Right Ear: Tympanic membrane normal.     Left Ear: Tympanic membrane normal.     Nose: Nose normal.     Mouth/Throat:     Mouth: Mucous membranes are moist.  Eyes:     Extraocular Movements: Extraocular movements intact.     Conjunctiva/sclera: Conjunctivae normal.     Pupils: Pupils are equal, round, and reactive to light.  Cardiovascular:     Rate and Rhythm: Normal rate and regular rhythm.     Heart sounds: No murmur heard.    No friction rub. No gallop.  Pulmonary:     Effort: Pulmonary effort is normal.     Breath sounds: Normal breath sounds.  Abdominal:     General: Abdomen is flat.     Palpations: Abdomen is soft.  Musculoskeletal:        General: Normal range of motion.  Skin:    General: Skin is warm and dry.  Neurological:     General: No focal deficit present.     Mental Status: She is alert and oriented to person, place, and time.  Psychiatric:        Mood and Affect: Mood normal.       Lab Results Lab Results  Component Value Date   WBC 5.3 03/02/2022   HGB 9.6 (L) 03/02/2022   HCT 29.8 (L)  03/02/2022   MCV 93.4 03/02/2022   PLT 107 (L) 03/02/2022    Lab Results  Component Value Date   CREATININE 0.73 03/02/2022   BUN 13 03/02/2022   NA 137 03/02/2022   K 3.4 (L) 03/02/2022   CL 102 03/02/2022   CO2 23 03/02/2022    Lab Results  Component Value Date   ALT  44 03/02/2022   AST 24 03/02/2022   GGT 190 (H) 08/08/2019   ALKPHOS 355 (H) 03/02/2022   BILITOT 0.9 03/02/2022        Laurice Record, [place] [facility] 03/02/2022, 12:50 PM

## 2022-03-02 NOTE — Plan of Care (Signed)

## 2022-03-03 ENCOUNTER — Other Ambulatory Visit: Payer: Self-pay

## 2022-03-03 ENCOUNTER — Emergency Department (HOSPITAL_COMMUNITY): Payer: Medicare Other

## 2022-03-03 ENCOUNTER — Emergency Department (HOSPITAL_COMMUNITY)
Admission: EM | Admit: 2022-03-03 | Discharge: 2022-03-03 | Disposition: A | Discharge status: Skilled Nursing Facility | Payer: Medicare Other | Attending: Emergency Medicine | Admitting: Emergency Medicine

## 2022-03-03 ENCOUNTER — Emergency Department: Payer: Self-pay

## 2022-03-03 ENCOUNTER — Encounter (HOSPITAL_COMMUNITY): Payer: Self-pay | Admitting: Emergency Medicine

## 2022-03-03 DIAGNOSIS — Z20822 Contact with and (suspected) exposure to covid-19: Secondary | ICD-10-CM | POA: Diagnosis not present

## 2022-03-03 DIAGNOSIS — C8190 Hodgkin lymphoma, unspecified, unspecified site: Secondary | ICD-10-CM | POA: Insufficient documentation

## 2022-03-03 DIAGNOSIS — J45909 Unspecified asthma, uncomplicated: Secondary | ICD-10-CM | POA: Diagnosis not present

## 2022-03-03 DIAGNOSIS — R509 Fever, unspecified: Secondary | ICD-10-CM | POA: Diagnosis not present

## 2022-03-03 DIAGNOSIS — D72829 Elevated white blood cell count, unspecified: Secondary | ICD-10-CM | POA: Insufficient documentation

## 2022-03-03 DIAGNOSIS — R7401 Elevation of levels of liver transaminase levels: Secondary | ICD-10-CM | POA: Diagnosis not present

## 2022-03-03 DIAGNOSIS — Z79899 Other long term (current) drug therapy: Secondary | ICD-10-CM | POA: Insufficient documentation

## 2022-03-03 DIAGNOSIS — T827XXA Infection and inflammatory reaction due to other cardiac and vascular devices, implants and grafts, initial encounter: Secondary | ICD-10-CM | POA: Diagnosis present

## 2022-03-03 DIAGNOSIS — I10 Essential (primary) hypertension: Secondary | ICD-10-CM | POA: Diagnosis not present

## 2022-03-03 DIAGNOSIS — Z794 Long term (current) use of insulin: Secondary | ICD-10-CM | POA: Insufficient documentation

## 2022-03-03 DIAGNOSIS — I700 Atherosclerosis of aorta: Secondary | ICD-10-CM | POA: Diagnosis not present

## 2022-03-03 DIAGNOSIS — J9811 Atelectasis: Secondary | ICD-10-CM | POA: Insufficient documentation

## 2022-03-03 DIAGNOSIS — E119 Type 2 diabetes mellitus without complications: Secondary | ICD-10-CM | POA: Insufficient documentation

## 2022-03-03 DIAGNOSIS — Z9981 Dependence on supplemental oxygen: Secondary | ICD-10-CM | POA: Diagnosis not present

## 2022-03-03 LAB — CBC WITH DIFFERENTIAL/PLATELET
Abs Immature Granulocytes: 0.48 10*3/uL — ABNORMAL HIGH (ref 0.00–0.07)
Basophils Absolute: 0.1 10*3/uL (ref 0.0–0.1)
Basophils Relative: 1 %
Eosinophils Absolute: 0 10*3/uL (ref 0.0–0.5)
Eosinophils Relative: 0 %
HCT: 26.9 % — ABNORMAL LOW (ref 36.0–46.0)
Hemoglobin: 8.7 g/dL — ABNORMAL LOW (ref 12.0–15.0)
Immature Granulocytes: 5 %
Lymphocytes Relative: 10 %
Lymphs Abs: 1 10*3/uL (ref 0.7–4.0)
MCH: 30.4 pg (ref 26.0–34.0)
MCHC: 32.3 g/dL (ref 30.0–36.0)
MCV: 94.1 fL (ref 80.0–100.0)
Monocytes Absolute: 1.9 10*3/uL — ABNORMAL HIGH (ref 0.1–1.0)
Monocytes Relative: 18 %
Neutro Abs: 7.2 10*3/uL (ref 1.7–7.7)
Neutrophils Relative %: 66 %
Platelets: 136 10*3/uL — ABNORMAL LOW (ref 150–400)
RBC: 2.86 MIL/uL — ABNORMAL LOW (ref 3.87–5.11)
RDW: 18.1 % — ABNORMAL HIGH (ref 11.5–15.5)
WBC: 10.6 10*3/uL — ABNORMAL HIGH (ref 4.0–10.5)
nRBC: 0.6 % — ABNORMAL HIGH (ref 0.0–0.2)

## 2022-03-03 LAB — LACTIC ACID, PLASMA: Lactic Acid, Venous: 1.4 mmol/L (ref 0.5–1.9)

## 2022-03-03 LAB — COMPREHENSIVE METABOLIC PANEL
ALT: 31 U/L (ref 0–44)
AST: 33 U/L (ref 15–41)
Albumin: 2.4 g/dL — ABNORMAL LOW (ref 3.5–5.0)
Alkaline Phosphatase: 298 U/L — ABNORMAL HIGH (ref 38–126)
Anion gap: 13 (ref 5–15)
BUN: 14 mg/dL (ref 8–23)
CO2: 23 mmol/L (ref 22–32)
Calcium: 7.8 mg/dL — ABNORMAL LOW (ref 8.9–10.3)
Chloride: 99 mmol/L (ref 98–111)
Creatinine, Ser: 0.76 mg/dL (ref 0.44–1.00)
GFR, Estimated: >60 mL/min (ref >60)
Glucose, Bld: 160 mg/dL — ABNORMAL HIGH (ref 70–99)
Potassium: 3.7 mmol/L (ref 3.5–5.1)
Sodium: 135 mmol/L (ref 135–145)
Total Bilirubin: 1.4 mg/dL — ABNORMAL HIGH (ref 0.3–1.2)
Total Protein: 5 g/dL — ABNORMAL LOW (ref 6.5–8.1)

## 2022-03-03 LAB — CATH TIP CULTURE: Culture: NO GROWTH

## 2022-03-03 LAB — URINALYSIS, ROUTINE W REFLEX MICROSCOPIC
Bilirubin Urine: NEGATIVE
Glucose, UA: NEGATIVE mg/dL
Hgb urine dipstick: NEGATIVE
Ketones, ur: NEGATIVE mg/dL
Nitrite: NEGATIVE
Protein, ur: 100 mg/dL — AB
Specific Gravity, Urine: 1.016 (ref 1.005–1.030)
pH: 7 (ref 5.0–8.0)

## 2022-03-03 LAB — RESP PANEL BY RT-PCR (RSV, FLU A&B, COVID)  RVPGX2
Influenza A by PCR: NEGATIVE
Influenza B by PCR: NEGATIVE
Resp Syncytial Virus by PCR: NEGATIVE
SARS Coronavirus 2 by RT PCR: NEGATIVE

## 2022-03-03 LAB — BRAIN NATRIURETIC PEPTIDE: B Natriuretic Peptide: 170.6 pg/mL — ABNORMAL HIGH (ref 0.0–100.0)

## 2022-03-03 MED ORDER — SODIUM CHLORIDE 0.9 % IV SOLN
2.0000 g | Freq: Once | INTRAVENOUS | Status: AC
  Administered 2022-03-03: 2 g via INTRAVENOUS
  Filled 2022-03-03: qty 20

## 2022-03-03 MED ORDER — SODIUM CHLORIDE 0.9% FLUSH: 10.0000 mL | INTRAVENOUS | Status: DC | PRN

## 2022-03-03 NOTE — Discharge Instructions (Signed)
IV access obtained with midline.  Restart her antibiotics.

## 2022-03-03 NOTE — ED Triage Notes (Signed)
Pt here from Nursing home with c/o unable to get pts antibiotics in due to IV not working  pt had temp 99.9 on arrival , has know infection from old port that has been removed

## 2022-03-03 NOTE — ED Notes (Signed)
Pt refused a in-out cath informed the nurse

## 2022-03-03 NOTE — ED Provider Notes (Signed)
[place] EMERGENCY DEPARTMENT AT [facility] Provider Note  CSN: [id number] Arrival date & time: 03/03/22 5208  Chief Complaint(s) No chief complaint on file.  HPI Heather Petty is a 72 y.o. female with past medical history as below, significant for She was admitted 1/18 2/2 neutropenic fever. Prev 1/5 with neutropenic fever s/p granix and cefepime. She has hx schizophrenia, Hodghkin lymphoma on chemo, enterococcus bacteremia, NASH. Admission 1/18 favored to be possible 2/2 infected port-a-cath which was removed 1/23. Started on rocephin/dapto x2 weeks per ID recommendations.  Pt presents to the ED with complaint of concern for infection. Pt was sent from SNF 2/2 IV malfunction, fever.  Patient is only complaint this time is itching on her skin to which the facility has been applying calamine lotion.  She feels like her breathing is at his typical baseline, she is on 2 L at all times.  Denies any nausea or vomiting.  No abdominal pain, no changes to her bladder function, does report intermittent constipation but no BRBPR or melena.  Tolerating p.o. intake without much difficulty.  She was febrile by EMS, tachypneic  Past Medical History Past Medical History:  Diagnosis Date   AKI (acute kidney injury) ([place]) 12/17/2017   Allergic rhinitis    Anemia    Arthritis    Asthma    Autonomic neuropathy    Depression    Diabetes mellitus    Diabetes mellitus without complication (HCC)    E coli infection    GERD (gastroesophageal reflux disease)    GERD (gastroesophageal reflux disease)    Gout    Hepatitis A    High cholesterol    Hypertension    Insomnia    Kidney failure    Metabolic encephalopathy    Morbid obesity ([place])    NASH (nonalcoholic steatohepatitis)    Pneumonia 12/17/2017   Schizophrenia ([place])    Stroke ([place])    Vitamin B deficiency    Vitamin D deficiency    Patient Active Problem List   Diagnosis Date Noted   Neutropenic fever ([place]) 02/23/2022    Port-A-Cath in place 02/15/2022   Febrile neutropenia ([place]) 02/11/2022   Hodgkin lymphoma ([place]) 01/10/2022   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   Candidal intertrigo 09/25/2019   CAP (community acquired pneumonia) 09/03/2019   Uncontrolled type 2 diabetes mellitus with hyperglycemia ([place]) 09/03/2019   Elevated LFTs 09/03/2019   Essential hypertension 09/03/2019   Gout 09/03/2019   Asthma 09/03/2019   Anemia 09/03/2019   Pneumonia 09/03/2019   Splenomegaly    Anemia 08/05/2019   Diarrhea 08/05/2019   DKA (diabetic ketoacidoses) 07/22/2019   Rhabdomyolysis 03/31/3610   Acute metabolic encephalopathy 24/49/7530   MRSA bacteremia 07/22/2019   Enterococcal bacteremia 07/22/2019   Vitiligo 07/22/2019   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   Sepsis ([place]) 07/19/2018   Ventricular tachycardia, sustained ([place]) 07/19/2018   NSTEMI (non-ST elevated myocardial infarction) ([place]) 07/19/2018   Wound infection 04/03/2018   Morbid obesity with BMI of 50.0-59.9, adult ([place]) 04/03/2018   Elevated alkaline phosphatase level 04/03/2018   Asthma exacerbation 03/14/2018   Hypotension 03/14/2018   Lactic acidosis 03/14/2018   GERD (gastroesophageal reflux disease) 03/14/2018   Acute renal failure superimposed on stage 3 chronic kidney disease ([place]) 03/14/2018   Macrocytic anemia 03/14/2018   Abnormal LFTs 03/14/2018   Acute kidney injury ([place]) 02/28/2018   Wound of sacral region 02/28/2018   Hypothermia 02/28/2018   Stage II pressure ulcer of sacral region ([place]) 02/15/2018   DM II (diabetes  mellitus, type II), controlled ([place]) 02/09/2018   Schizophrenia ([place]) 02/09/2018   Decubitus ulcer of right leg 02/06/2018   Acute renal failure (ARF) ([place]) 02/05/2018   Hyperlipidemia associated with type 2 diabetes mellitus ([place]) 02/05/2018   Hyperkalemia 02/05/2018   Dermatitis 02/05/2018   Open back wound 02/05/2018   Dysuria 02/05/2018   Paranoid schizophrenia ([place])    AKI (acute kidney injury) ([place])  12/17/2017   Type II diabetes mellitus with renal manifestations ([place]) 12/17/2017   HTN (hypertension) 12/17/2017   Gout    Home Medication(s) Prior to Admission medications   Medication Sig Start Date End Date Taking? Authorizing Provider  acetaminophen (TYLENOL) 500 MG tablet Take 1,000 mg by mouth 3 (three) times daily as needed for moderate pain.    [provider]  albuterol (VENTOLIN HFA) 108 (90 Base) MCG/ACT inhaler Inhale 2 puffs into the lungs every 4 (four) hours as needed for wheezing or shortness of breath.    [provider]  allopurinol (ZYLOPRIM) 100 MG tablet Take 100 mg by mouth daily. 01/19/22   [provider]  amLODipine (NORVASC) 10 MG tablet Take 10 mg by mouth daily.    [provider]  calamine lotion Apply 1 Application topically 2 (two) times daily. 03/02/22   Georgette Shell, MD  carvedilol (COREG) 6.25 MG tablet Take 6.25 mg by mouth 2 (two) times daily with a meal.    [provider]  cefTRIAXone (ROCEPHIN) IVPB Inject 2 g into the vein daily. Indication:  port infection First Dose: No Last Day of Therapy:  03/14/2022 Labs - Once weekly:  CBC/D and BMP, Labs - Every other week:  ESR and CRP Method of administration: IV Push Method of administration may be changed at the discretion of home infusion pharmacist based upon assessment of the patient and/or caregiver's ability to self-administer the medication ordered. 03/02/22   Georgette Shell, MD  cholecalciferol (VITAMIN D3) 25 MCG (1000 UNIT) tablet Take 2,000 Units by mouth daily.    [provider]  daptomycin (CUBICIN) IVPB Inject 500 mg into the vein daily for 12 days. Indication:  port infection First Dose: No Last Day of Therapy:  03/14/2022 Labs - Once weekly:  CBC/D, BMP, and CPK Labs - Every other week:  ESR and CRP Method of administration: IV Push Method of administration may be changed at the discretion of home infusion pharmacist based upon  assessment of the patient and/or caregiver's ability to self-administer the medication ordered. 03/02/22 03/14/22  Georgette Shell, MD  doxepin (SINEQUAN) 25 MG capsule Take 25 mg by mouth at bedtime.    [provider]  famotidine (PEPCID) 20 MG tablet Take 20 mg by mouth 2 (two) times daily.    [provider]  fluticasone (FLONASE) 50 MCG/ACT nasal spray Place 1 spray into both nostrils daily.    [provider]  guaiFENesin (MUCINEX) 600 MG 12 hr tablet Take 600 mg by mouth every 12 (twelve) hours.    [provider]  hydrOXYzine (ATARAX/VISTARIL) 25 MG tablet Take 25 mg by mouth in the morning and at bedtime.    [provider]  insulin aspart (NOVOLOG) 100 UNIT/ML injection Inject 0-12 Units into the skin See admin instructions. Inject 0-12 units into the skin 4 times a day (before meals and at bedtime) per sliding scale: BGL <60 or >400  = CALL MD; 60-200 = 0 units; 201-250 = 2 units; 251-300 = 4 units; 301-350 = 6 units; 351-400 = 8  units; 401-450 = 10 units; >450 = 12 units 07/31/19   Annita Brod, MD  lactose free nutrition (BOOST) LIQD Take 237 mLs by mouth 2 (two) times daily between meals.    [provider]  LANTUS SOLOSTAR 100 UNIT/ML Solostar Pen Inject 28-38 Units into the skin 2 (two) times daily. Inject 38 units in the morning and Inject 28 units at bedtime 02/02/22   [provider]  lidocaine 4 % Place 1 patch onto the skin daily.    [provider]  lidocaine-prilocaine (EMLA) cream Apply 1 Application topically as needed. Patient taking differently: Apply 1 Application topically as needed (access port). 01/16/22   Dede Query T, PA-C  lip balm (CARMEX) ointment Apply 1 Application topically as needed for lip care (cold sores). 03/02/22   Georgette Shell, MD  lisinopril (ZESTRIL) 5 MG tablet Take 5 mg by mouth daily.    [provider]  loperamide (IMODIUM) 2 MG capsule Take 1 capsule (2  mg total) by mouth as needed for diarrhea or loose stools. 09/09/19   Pokhrel, Corrie Mckusick, MD  loratadine (CLARITIN) 10 MG tablet Take 10 mg by mouth daily.    [provider]  miconazole nitrate (MICATIN) POWD Apply 1 Application topically 2 (two) times daily. 03/02/22   Georgette Shell, MD  Mouthwashes (MOUTH RINSE) LIQD solution 15 mLs by Mouth Rinse route as needed (for oral care). 03/02/22   Georgette Shell, MD  mupirocin ointment (BACTROBAN) 2 % Apply 1 Application topically 2 (two) times daily.    [provider]  Nystatin (GERHARDT'S BUTT CREAM) CREA Apply 1 Application topically 4 (four) times daily. 03/02/22   Georgette Shell, MD  nystatin (MYCOSTATIN) 100000 UNIT/ML suspension Take 5 mLs (500,000 Units total) by mouth 4 (four) times daily. 03/02/22   Georgette Shell, MD  Pollen Extracts (PROSTAT PO) Take 30 mLs by mouth in the morning and at bedtime.    [provider]  Polyethyl Glycol-Propyl Glycol (SYSTANE ULTRA) 0.4-0.3 % SOLN Place 2 drops into both eyes in the morning and at bedtime.    [provider]  polyethylene glycol (MIRALAX / GLYCOLAX) 17 g packet Take 17 g by mouth daily as needed for mild constipation.    [provider]  prochlorperazine (COMPAZINE) 10 MG tablet Take 1 tablet (10 mg total) by mouth every 6 (six) hours as needed for nausea or vomiting. 01/16/22   Lincoln Brigham, PA-C  Simethicone (GAS-X ULTRA STRENGTH) 180 MG CAPS Take 1 capsule by mouth every 8 (eight) hours as needed (gas).    [provider]  Skin Protectants, Misc. (EUCERIN) cream Apply 1 Application topically 2 (two) times daily. Where skin is peeling    [provider]  traMADol (ULTRAM) 50 MG tablet Take 1 tablet (50 mg total) by mouth every 6 (six) hours as needed for moderate pain. 03/02/22   Georgette Shell, MD  zinc oxide 20 % ointment Apply 1 Application topically See admin instructions. Every shift and as needed  (bilateral buttock)    [provider]  Past Surgical History Past Surgical History:  Procedure Laterality Date   CYST EXCISION     buttucks   DENTAL SURGERY     IR IMAGING GUIDED PORT INSERTION  01/10/2022   IR REMOVAL TUN ACCESS W/ PORT W/O FL MOD SED  02/28/2022   TONSILLECTOMY     Family History Family History  Adopted: Yes  Problem Relation Age of Onset   COPD Daughter     Social History Social History   Tobacco Use   Smoking status: Never   Smokeless tobacco: Never  Vaping Use   Vaping Use: Never used  Substance Use Topics   Alcohol use: Never   Drug use: Never   Allergies Emend [aprepitant], Vancomycin, Fosaprepitant, Lasix [furosemide], and Lasix [furosemide]  Review of Systems Review of Systems  Constitutional:  Positive for fever. Negative for chills.  HENT:  Negative for facial swelling and trouble swallowing.   Eyes:  Negative for photophobia and visual disturbance.  Respiratory:  Negative for cough and shortness of breath.   Cardiovascular:  Negative for chest pain and palpitations.  Gastrointestinal:  Negative for abdominal pain, nausea and vomiting.  Endocrine: Negative for polydipsia and polyuria.  Genitourinary:  Negative for difficulty urinating and hematuria.  Musculoskeletal:  Negative for gait problem and joint swelling.  Skin:  Negative for pallor and rash.  Neurological:  Negative for syncope and headaches.  Psychiatric/Behavioral:  Negative for agitation and confusion.     Physical Exam Vital Signs  I have reviewed the triage vital signs BP 111/62   Pulse 73   Temp 98.2 F (36.8 C) (Oral)   Resp (!) 22   SpO2 99%  Physical Exam Vitals and nursing note reviewed.  Constitutional:      General: She is not in acute distress.    Comments: Pt with calamine lotion covering nearly her whole body  HENT:      Head: Normocephalic and atraumatic.     Right Ear: External ear normal.     Left Ear: External ear normal.     Nose: Nose normal.     Mouth/Throat:     Mouth: Mucous membranes are moist.     Pharynx: Oropharynx is clear.     Comments: Dry lips Eyes:     General: No scleral icterus.       Right eye: No discharge.        Left eye: No discharge.  Cardiovascular:     Rate and Rhythm: Normal rate and regular rhythm.     Pulses: Normal pulses.     Heart sounds: Normal heart sounds.  Pulmonary:     Effort: Pulmonary effort is normal. No respiratory distress.     Breath sounds: Normal breath sounds.  Chest:    Abdominal:     General: Abdomen is flat.     Palpations: Abdomen is soft.     Tenderness: There is no abdominal tenderness. There is no guarding or rebound.  Musculoskeletal:        General: Normal range of motion.     Cervical back: No rigidity.     Right lower leg: No edema.     Left lower leg: No edema.  Skin:    General: Skin is warm and dry.     Capillary Refill: Capillary refill takes less than 2 seconds.     Comments: Calamine lotion  Superficial excoriations noted diffusely, seen on prior admission per chart rev  Neurological:     Mental Status: She is alert and  oriented to person, place, and time.     GCS: GCS eye subscore is 4. GCS verbal subscore is 5. GCS motor subscore is 6.  Psychiatric:        Mood and Affect: Mood normal.        Behavior: Behavior normal.     ED Results and Treatments Labs (all labs ordered are listed, but only abnormal results are displayed) Labs Reviewed  COMPREHENSIVE METABOLIC PANEL - Abnormal; Notable for the following components:      Result Value   Glucose, Bld 160 (*)    Calcium 7.8 (*)    Total Protein 5.0 (*)    Albumin 2.4 (*)    Alkaline Phosphatase 298 (*)    Total Bilirubin 1.4 (*)    All other components within normal limits  CBC WITH DIFFERENTIAL/PLATELET - Abnormal; Notable for the following components:    WBC 10.6 (*)    RBC 2.86 (*)    Hemoglobin 8.7 (*)    HCT 26.9 (*)    RDW 18.1 (*)    Platelets 136 (*)    nRBC 0.6 (*)    Monocytes Absolute 1.9 (*)    Abs Immature Granulocytes 0.48 (*)    All other components within normal limits  BRAIN NATRIURETIC PEPTIDE - Abnormal; Notable for the following components:   B Natriuretic Peptide 170.6 (*)    All other components within normal limits  RESP PANEL BY RT-PCR (RSV, FLU A&B, COVID)  RVPGX2  LACTIC ACID, PLASMA  LACTIC ACID, PLASMA  URINALYSIS, ROUTINE W REFLEX MICROSCOPIC  PROTIME-INR  APTT                                                                                                                          Radiology [place] EKG SITE RITE  Result Date: 03/03/2022 If Site Rite image not attached, placement could not be confirmed due to current cardiac rhythm.  DG Chest Port 1 View  Result Date: 03/03/2022 CLINICAL DATA:  Provided history: Questionable sepsis-evaluate for abnormality. EXAM: PORTABLE CHEST 1 VIEW COMPARISON:  Prior chest radiographs 02/23/2022 and earlier. FINDINGS: The cardiomediastinal silhouette is unchanged. Aortic atherosclerosis. Chronic elevation of the right hemidiaphragm. Focus of linear atelectasis within the mid right lung. No appreciable airspace consolidation or pulmonary edema. No evidence of pleural effusion or pneumothorax. No acute bony abnormality identified. IMPRESSION: 1. No evidence of airspace consolidation. 2. Focus of linear atelectasis within the mid right lung. 3. Chronic elevation of the right hemidiaphragm. 4. Aortic Atherosclerosis (ICD10-I70.0). Electronically Signed   By: Kellie Simmering D.O.   On: 03/03/2022 10:26    Pertinent labs & imaging results that were available during my care of the patient were reviewed by me and considered in my medical decision making (see MDM for details).  Medications Ordered in ED Medications  cefTRIAXone (ROCEPHIN) 2 g in sodium chloride 0.9 % 100 mL IVPB (2 g  Intravenous New Bag/Given 03/03/22 1528)  Procedures Procedures  (including critical care time)  Medical Decision Making / ED Course   MDM:  Chalisa Kobler is a 72 y.o. female She was admitted 1/18 2/2 neutropenic fever. Prev 1/5 with neutropenic fever s/p granix and cefepime. She has hx schizophrenia, Hodghkin lymphoma on chemo, enterococcus bacteremia, NASH. Admission 1/18 favored to be possible 2/2 infected port-a-cath which was removed 1/23. Started on rocephin/dapto x2 weeks per ID recommendations presenting to the ED secondary to fever, IV. The complaint involves an extensive differential diagnosis and also carries with it a high risk of complications and morbidity.  Serious etiology was considered. Ddx includes but is not limited to: Differential diagnosis for adult fever includes but is not exclusive to community-acquired pneumonia, urinary tract infection, acute cholecystitis, viral syndrome, cellulitis, tick bourne disease,  decubitus ulcer, necrotizing fasciitis, meningitis, encephalitis, influenza, etc.  Pt with low grade fever here  On initial assessment the patient is: Resting comfortably on her 2 L nasal cannula. Vital signs and nursing notes were reviewed  Clinical Course as of 03/03/22 1607  Fri Mar 03, 2022  1446 Alkaline Phosphatase(!): 298 Improved from yesterday. [SG]  1446 Pt needs midline to go back to facility to continue her IV rocephin and dapto; [facility] nurse today unable to perform midline 2/2 lack of cert; will request picc line team to fulfill  [SG]    Clinical Course User Index [SG] Jeanell Sparrow, DO   Patient's labs reviewed, they are similar to her baseline. She does have mild leukocytosis 10.6.  She has low-grade fever.  She is not neutropenic. She is already receiving Rocephin and daptomycin at facility; did reorder her  Rocephin here while we are waiting for midline placement CMP is stable, similar to her baseline Chest x-ray is unremarkable, no acute infection or pneumothorax RVP was negative Patient reports feeling better, her initial primary complaint was just that her skin was itchy.  Otherwise she feels that she is at her baseline currently. Patient is pending urinalysis and midline placement.  Once these have been completed and if urinalysis is normal would favor discharge back to her care facility.  I reordered her Rocephin here.  Is receiving IV antibiotics at her nursing home.  She is HDS at this time.  Additional history obtained: -Additional history obtained from na -External records from outside source obtained and reviewed including: Chart review including previous notes, labs, imaging, consultation notes including recent admissions, prior visits, home medications, prior labs and imaging   Lab Tests: -I ordered, reviewed, and interpreted labs.   The pertinent results include:   Labs Reviewed  COMPREHENSIVE METABOLIC PANEL - Abnormal; Notable for the following components:      Result Value   Glucose, Bld 160 (*)    Calcium 7.8 (*)    Total Protein 5.0 (*)    Albumin 2.4 (*)    Alkaline Phosphatase 298 (*)    Total Bilirubin 1.4 (*)    All other components within normal limits  CBC WITH DIFFERENTIAL/PLATELET - Abnormal; Notable for the following components:   WBC 10.6 (*)    RBC 2.86 (*)    Hemoglobin 8.7 (*)    HCT 26.9 (*)    RDW 18.1 (*)    Platelets 136 (*)    nRBC 0.6 (*)    Monocytes Absolute 1.9 (*)    Abs Immature Granulocytes 0.48 (*)    All other components within normal limits  BRAIN NATRIURETIC PEPTIDE - Abnormal; Notable for the following components:   B  Natriuretic Peptide 170.6 (*)    All other components within normal limits  RESP PANEL BY RT-PCR (RSV, FLU A&B, COVID)  RVPGX2  LACTIC ACID, PLASMA  LACTIC ACID, PLASMA  URINALYSIS, ROUTINE W REFLEX MICROSCOPIC   PROTIME-INR  APTT    Notable for as above  EKG   EKG Interpretation  Date/Time:  Friday March 03 2022 10:52:43 EST Ventricular Rate:  75 PR Interval:  158 QRS Duration: 71 QT Interval:  584 QTC Calculation: 653 R Axis:   30 Text Interpretation: Sinus rhythm Low voltage, precordial leads Abnormal R-wave progression, early transition Prolonged QT interval Interpretation limited secondary to artifact similar to prior Confirmed by Wynona Dove ([id number]) on 03/03/2022 4:05:48 PM         Imaging Studies ordered: I ordered imaging studies including chest x-ray I independently visualized the following imaging with scope of interpretation limited to determining acute life threatening conditions related to emergency care: Chest x-ray was stable, does show some atelectasis I independently visualized and interpreted imaging. I agree with the radiologist interpretation   Medicines ordered and prescription drug management: Meds ordered this encounter  Medications   cefTRIAXone (ROCEPHIN) 2 g in sodium chloride 0.9 % 100 mL IVPB    Order Specific Question:   Antibiotic Indication:    Answer:   Other Indication (list below)    Order Specific Question:   Other Indication:    Answer:   port infection    -I have reviewed the patients home medicines and have made adjustments as needed   Consultations Obtained: I requested consultation with the PICC team - will eval   Cardiac Monitoring: The patient was maintained on a cardiac monitor.  I personally viewed and interpreted the cardiac monitored which showed an underlying rhythm of: nsr  Social Determinants of Health:  Diagnosis or treatment significantly limited by social determinants of health: nursing home resident   Reevaluation: After the interventions noted above, I reevaluated the patient and found that they have stayed the same  Co morbidities that complicate the patient evaluation  Past Medical History:  Diagnosis Date    AKI (acute kidney injury) ([place]) 12/17/2017   Allergic rhinitis    Anemia    Arthritis    Asthma    Autonomic neuropathy    Depression    Diabetes mellitus    Diabetes mellitus without complication (HCC)    E coli infection    GERD (gastroesophageal reflux disease)    GERD (gastroesophageal reflux disease)    Gout    Hepatitis A    High cholesterol    Hypertension    Insomnia    Kidney failure    Metabolic encephalopathy    Morbid obesity ([place])    NASH (nonalcoholic steatohepatitis)    Pneumonia 12/17/2017   Schizophrenia ([place])    Stroke ([place])    Vitamin B deficiency    Vitamin D deficiency       Dispostion: Disposition decision including need for hospitalization was considered, and patient dispo pending at shift change    Final Clinical Impression(s) / ED Diagnoses Final diagnoses:  None     This chart was dictated using voice recognition software.  Despite best efforts to proofread,  errors can occur which can change the documentation meaning.    Wynona Dove A, DO 03/03/22 1607

## 2022-03-03 NOTE — ED Provider Notes (Addendum)
PICC line nurse able to start IV.  Midline started.  Patient can resume antibiotics.  Patient stable for discharge back to facility.  Now that she is got access.  And only had an IV in her hand.  Needed better access for the continuation of the IV antibiotics that were ordered during her admission to continue post discharge.  Patient with recent admission and discharge.  But needs continuous IV antibiotics for neutropenia.  Patient has Hodgkin's lymphoma.     Fredia Sorrow, MD 03/03/22 2249    Fredia Sorrow, MD 03/03/22 2250

## 2022-03-03 NOTE — ED Notes (Signed)
Unable to obtain blood cultures

## 2022-03-03 NOTE — ED Notes (Signed)
Unable to obtain enough blood for cultures at this time

## 2022-03-03 NOTE — ED Notes (Signed)
Patient denies pain and is resting comfortably.  

## 2022-03-05 LAB — CULTURE, BLOOD (ROUTINE X 2)
Culture: NO GROWTH
Culture: NO GROWTH
Special Requests: ADEQUATE
Special Requests: ADEQUATE

## 2022-03-07 ENCOUNTER — Telehealth: Payer: Self-pay | Admitting: Hematology and Oncology

## 2022-03-07 NOTE — Telephone Encounter (Signed)
Called care facility to give dates and times for treatment appointments for patient. Patient will be notified.

## 2022-03-08 ENCOUNTER — Other Ambulatory Visit: Payer: Self-pay

## 2022-03-13 MED FILL — Dexamethasone Sodium Phosphate Inj 100 MG/10ML: INTRAMUSCULAR | Qty: 1 | Status: AC

## 2022-03-14 ENCOUNTER — Inpatient Hospital Stay: Payer: Medicare Other

## 2022-03-14 ENCOUNTER — Other Ambulatory Visit: Payer: Self-pay

## 2022-03-14 ENCOUNTER — Telehealth: Payer: Self-pay

## 2022-03-14 ENCOUNTER — Inpatient Hospital Stay: Payer: Medicare Other | Attending: Physician Assistant | Admitting: Hematology and Oncology

## 2022-03-14 ENCOUNTER — Inpatient Hospital Stay: Payer: Medicare Other | Admitting: Internal Medicine

## 2022-03-14 DIAGNOSIS — C8198 Hodgkin lymphoma, unspecified, lymph nodes of multiple sites: Secondary | ICD-10-CM

## 2022-03-14 DIAGNOSIS — Z95828 Presence of other vascular implants and grafts: Secondary | ICD-10-CM

## 2022-03-14 DIAGNOSIS — C8173 Other classical Hodgkin lymphoma, intra-abdominal lymph nodes: Secondary | ICD-10-CM | POA: Insufficient documentation

## 2022-03-14 LAB — CMP (CANCER CENTER ONLY)
ALT: 23 U/L (ref 0–44)
AST: 20 U/L (ref 15–41)
Albumin: 3.2 g/dL — ABNORMAL LOW (ref 3.5–5.0)
Alkaline Phosphatase: 295 U/L — ABNORMAL HIGH (ref 38–126)
Anion gap: 9 (ref 5–15)
BUN: 17 mg/dL (ref 8–23)
CO2: 25 mmol/L (ref 22–32)
Calcium: 9.2 mg/dL (ref 8.9–10.3)
Chloride: 104 mmol/L (ref 98–111)
Creatinine: 0.77 mg/dL (ref 0.44–1.00)
GFR, Estimated: >60 mL/min (ref >60)
Glucose, Bld: 169 mg/dL — ABNORMAL HIGH (ref 70–99)
Potassium: 4.2 mmol/L (ref 3.5–5.1)
Sodium: 138 mmol/L (ref 135–145)
Total Bilirubin: 0.6 mg/dL (ref 0.3–1.2)
Total Protein: 6.4 g/dL — ABNORMAL LOW (ref 6.5–8.1)

## 2022-03-14 LAB — CBC WITH DIFFERENTIAL (CANCER CENTER ONLY)
Abs Immature Granulocytes: 0.05 10*3/uL (ref 0.00–0.07)
Basophils Absolute: 0.1 10*3/uL (ref 0.0–0.1)
Basophils Relative: 1 %
Eosinophils Absolute: 0.6 10*3/uL — ABNORMAL HIGH (ref 0.0–0.5)
Eosinophils Relative: 5 %
HCT: 32.1 % — ABNORMAL LOW (ref 36.0–46.0)
Hemoglobin: 10.1 g/dL — ABNORMAL LOW (ref 12.0–15.0)
Immature Granulocytes: 1 %
Lymphocytes Relative: 11 %
Lymphs Abs: 1.2 10*3/uL (ref 0.7–4.0)
MCH: 29.9 pg (ref 26.0–34.0)
MCHC: 31.5 g/dL (ref 30.0–36.0)
MCV: 95 fL (ref 80.0–100.0)
Monocytes Absolute: 1.6 10*3/uL — ABNORMAL HIGH (ref 0.1–1.0)
Monocytes Relative: 15 %
Neutro Abs: 7.4 10*3/uL (ref 1.7–7.7)
Neutrophils Relative %: 67 %
Platelet Count: 209 10*3/uL (ref 150–400)
RBC: 3.38 MIL/uL — ABNORMAL LOW (ref 3.87–5.11)
RDW: 18 % — ABNORMAL HIGH (ref 11.5–15.5)
WBC Count: 10.8 10*3/uL — ABNORMAL HIGH (ref 4.0–10.5)
nRBC: 0 % (ref 0.0–0.2)

## 2022-03-14 MED ORDER — SODIUM CHLORIDE 0.9% FLUSH: 10.0000 mL | Freq: Once | INTRAVENOUS | Status: DC

## 2022-03-14 NOTE — Progress Notes (Deleted)
Patient: Heather Petty  DOB: February 14, 1950 MRN: [id number] PCP: Pcp, No    Patient Active Problem List   Diagnosis Date Noted   Neutropenic fever ([place]) 02/23/2022   Port-A-Cath in place 02/15/2022   Febrile neutropenia ([place]) 02/11/2022   Hodgkin lymphoma ([place]) 01/10/2022   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   Candidal intertrigo 09/25/2019   CAP (community acquired pneumonia) 09/03/2019   Uncontrolled type 2 diabetes mellitus with hyperglycemia ([place]) 09/03/2019   Elevated LFTs 09/03/2019   Essential hypertension 09/03/2019   Gout 09/03/2019   Asthma 09/03/2019   Anemia 09/03/2019   Pneumonia 09/03/2019   Splenomegaly    Anemia 08/05/2019   Diarrhea 08/05/2019   DKA (diabetic ketoacidoses) 07/22/2019   Rhabdomyolysis 123XX123   Acute metabolic encephalopathy 123XX123   MRSA bacteremia 07/22/2019   Enterococcal bacteremia 07/22/2019   Vitiligo 07/22/2019   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   Sepsis ([place]) 07/19/2018   Ventricular tachycardia, sustained ([place]) 07/19/2018   NSTEMI (non-ST elevated myocardial infarction) ([place]) 07/19/2018   Wound infection 04/03/2018   Morbid obesity with BMI of 50.0-59.9, adult ([place]) 04/03/2018   Elevated alkaline phosphatase level 04/03/2018   Asthma exacerbation 03/14/2018   Hypotension 03/14/2018   Lactic acidosis 03/14/2018   GERD (gastroesophageal reflux disease) 03/14/2018   Acute renal failure superimposed on stage 3 chronic kidney disease ([place]) 03/14/2018   Macrocytic anemia 03/14/2018   Abnormal LFTs 03/14/2018   Acute kidney injury ([place]) 02/28/2018   Wound of sacral region 02/28/2018   Hypothermia 02/28/2018   Stage II pressure ulcer of sacral region ([place]) 02/15/2018   DM II (diabetes mellitus, type II), controlled ([place]) 02/09/2018   Schizophrenia ([place]) 02/09/2018   Decubitus ulcer of right leg 02/06/2018   Acute renal failure (ARF) ([place]) 02/05/2018   Hyperlipidemia associated with type 2 diabetes mellitus ([place])  02/05/2018   Hyperkalemia 02/05/2018   Dermatitis 02/05/2018   Open back wound 02/05/2018   Dysuria 02/05/2018   Paranoid schizophrenia ([place])    AKI (acute kidney injury) ([place]) 12/17/2017   Type II diabetes mellitus with renal manifestations ([place]) 12/17/2017   HTN (hypertension) 12/17/2017   Gout      Subjective:  Heather Petty is a 72 y.o. F with past medical history of diabetes, schizophrenia, sacral ulcer, thigh ulcer, Hodgkin's lymphoma s/p chemotherapy with most recent dose on 1/10, recent admission for neutropenic fever presents for hospital follow-up neutropenic fever.  Patient presented from SNF with temp of 103 and mental status changes on 1/19 to [place] long.  Blood cultures have been negative.  Noted that Was placed on 12/5, since then patient has had 2 admissions for neutropenic fever.  Hospital course was also complicated by skin findings of excoriating rash throughout.  Initially treated with the vancomycin and cefepime then transition to oral Doxy Augmentin x 1 day.  Port was removed and patient transition to daptomycin and ceftriaxone to complete 2 weeks of antibiotics) EOT 03/14/2022.    ROS  Past Medical History:  Diagnosis Date   AKI (acute kidney injury) ([place]) 12/17/2017   Allergic rhinitis    Anemia    Arthritis    Asthma    Autonomic neuropathy    Depression    Diabetes mellitus    Diabetes mellitus without complication ([place])    E coli infection    GERD (gastroesophageal reflux disease)    GERD (gastroesophageal reflux disease)    Gout    Hepatitis A    High cholesterol    Hypertension    Insomnia  Kidney failure    Metabolic encephalopathy    Morbid obesity ([place])    NASH (nonalcoholic steatohepatitis)    Pneumonia 12/17/2017   Schizophrenia ([place])    Stroke ([place])    Vitamin B deficiency    Vitamin D deficiency     Outpatient Medications Prior to Visit  Medication Sig Dispense Refill   acetaminophen (TYLENOL) 500 MG tablet Take 1,000 mg by  mouth 3 (three) times daily as needed for moderate pain.     albuterol (VENTOLIN HFA) 108 (90 Base) MCG/ACT inhaler Inhale 2 puffs into the lungs every 4 (four) hours as needed for wheezing or shortness of breath.     allopurinol (ZYLOPRIM) 100 MG tablet Take 100 mg by mouth daily.     amLODipine (NORVASC) 10 MG tablet Take 10 mg by mouth daily.     calamine lotion Apply 1 Application topically 2 (two) times daily.  0   carvedilol (COREG) 6.25 MG tablet Take 6.25 mg by mouth 2 (two) times daily with a meal.     cefTRIAXone (ROCEPHIN) IVPB Inject 2 g into the vein daily. Indication:  port infection First Dose: No Last Day of Therapy:  03/14/2022 Labs - Once weekly:  CBC/D and BMP, Labs - Every other week:  ESR and CRP Method of administration: IV Push Method of administration may be changed at the discretion of home infusion pharmacist based upon assessment of the patient and/or caregiver's ability to self-administer the medication ordered. 12 Units 0   cholecalciferol (VITAMIN D3) 25 MCG (1000 UNIT) tablet Take 2,000 Units by mouth daily.     daptomycin (CUBICIN) IVPB Inject 500 mg into the vein daily for 12 days. Indication:  port infection First Dose: No Last Day of Therapy:  03/14/2022 Labs - Once weekly:  CBC/D, BMP, and CPK Labs - Every other week:  ESR and CRP Method of administration: IV Push Method of administration may be changed at the discretion of home infusion pharmacist based upon assessment of the patient and/or caregiver's ability to self-administer the medication ordered. 12 Units 0   doxepin (SINEQUAN) 25 MG capsule Take 25 mg by mouth at bedtime.     famotidine (PEPCID) 20 MG tablet Take 20 mg by mouth 2 (two) times daily.     fluticasone (FLONASE) 50 MCG/ACT nasal spray Place 1 spray into both nostrils daily.     guaiFENesin (MUCINEX) 600 MG 12 hr tablet Take 600 mg by mouth every 12 (twelve) hours.     hydrOXYzine (ATARAX/VISTARIL) 25 MG tablet Take 25 mg by mouth in the  morning and at bedtime.     insulin aspart (NOVOLOG) 100 UNIT/ML injection Inject 0-12 Units into the skin See admin instructions. Inject 0-12 units into the skin 4 times a day (before meals and at bedtime) per sliding scale: BGL <60 or >400  = CALL MD; 60-200 = 0 units; 201-250 = 2 units; 251-300 = 4 units; 301-350 = 6 units; 351-400 = 8 units; 401-450 = 10 units; >450 = 12 units     lactose free nutrition (BOOST) LIQD Take 237 mLs by mouth 2 (two) times daily between meals.     LANTUS SOLOSTAR 100 UNIT/ML Solostar Pen Inject 28-38 Units into the skin 2 (two) times daily. Inject 38 units in the morning and Inject 28 units at bedtime     lidocaine 4 % Place 1 patch onto the skin daily.     lidocaine-prilocaine (EMLA) cream Apply 1 Application topically as needed. (Patient taking differently: Apply  1 Application topically as needed (access port).) 30 g 0   lip balm (CARMEX) ointment Apply 1 Application topically as needed for lip care (cold sores). 7 g 0   lisinopril (ZESTRIL) 5 MG tablet Take 5 mg by mouth daily.     loperamide (IMODIUM) 2 MG capsule Take 1 capsule (2 mg total) by mouth as needed for diarrhea or loose stools. 30 capsule 0   loratadine (CLARITIN) 10 MG tablet Take 10 mg by mouth daily.     miconazole nitrate (MICATIN) POWD Apply 1 Application topically 2 (two) times daily.  0   Mouthwashes (MOUTH RINSE) LIQD solution 15 mLs by Mouth Rinse route as needed (for oral care).  0   mupirocin ointment (BACTROBAN) 2 % Apply 1 Application topically 2 (two) times daily.     Nystatin (GERHARDT'S BUTT CREAM) CREA Apply 1 Application topically 4 (four) times daily.     nystatin (MYCOSTATIN) 100000 UNIT/ML suspension Take 5 mLs (500,000 Units total) by mouth 4 (four) times daily. 60 mL 0   Pollen Extracts (PROSTAT PO) Take 30 mLs by mouth in the morning and at bedtime.     Polyethyl Glycol-Propyl Glycol (SYSTANE ULTRA) 0.4-0.3 % SOLN Place 2 drops into both eyes in the morning and at bedtime.      polyethylene glycol (MIRALAX / GLYCOLAX) 17 g packet Take 17 g by mouth daily as needed for mild constipation.     prochlorperazine (COMPAZINE) 10 MG tablet Take 1 tablet (10 mg total) by mouth every 6 (six) hours as needed for nausea or vomiting. 30 tablet 0   Simethicone (GAS-X ULTRA STRENGTH) 180 MG CAPS Take 1 capsule by mouth every 8 (eight) hours as needed (gas).     Skin Protectants, Misc. (EUCERIN) cream Apply 1 Application topically 2 (two) times daily. Where skin is peeling     traMADol (ULTRAM) 50 MG tablet Take 1 tablet (50 mg total) by mouth every 6 (six) hours as needed for moderate pain. 30 tablet 0   zinc oxide 20 % ointment Apply 1 Application topically See admin instructions. Every shift and as needed (bilateral buttock)     No facility-administered medications prior to visit.     Allergies  Allergen Reactions   Emend [Aprepitant] Other (See Comments)    Pt experienced shooting back pain at 10/10 when medication was administered the first time. See Hypersensitivity note from 01/17/2022.   Vancomycin Other (See Comments)    Patient becomes hypotensive, lethargic and itchy. Has tolerated it many times.   Fosaprepitant Other (See Comments)    Back pain / hypersensitivity 01/17/2022   Lasix [Furosemide] Other (See Comments)    IV-LASIX ==> Reaction: Burning   Lasix [Furosemide] Other (See Comments)    On MAR    Social History   Tobacco Use   Smoking status: Never   Smokeless tobacco: Never  Vaping Use   Vaping Use: Never used  Substance Use Topics   Alcohol use: Never   Drug use: Never    Family History  Adopted: Yes  Problem Relation Age of Onset   COPD Daughter     Objective:  There were no vitals filed for this visit. There is no height or weight on file to calculate BMI.  Physical Exam  Lab Results: Lab Results  Component Value Date   WBC 10.6 (H) 03/03/2022   HGB 8.7 (L) 03/03/2022   HCT 26.9 (L) 03/03/2022   MCV 94.1 03/03/2022   PLT 136 (L)  03/03/2022  Lab Results  Component Value Date   CREATININE 0.76 03/03/2022   BUN 14 03/03/2022   NA 135 03/03/2022   K 3.7 03/03/2022   CL 99 03/03/2022   CO2 23 03/03/2022    Lab Results  Component Value Date   ALT 31 03/03/2022   AST 33 03/03/2022   GGT 190 (H) 08/08/2019   ALKPHOS 298 (H) 03/03/2022   BILITOT 1.4 (H) 03/03/2022     Assessment & Plan:  #Neutropenic fever 2/2 suspected port infection #Skin excoriations #Decubitus ulcer    Laurice Record, MD [facility]   03/14/22  9:17 AM

## 2022-03-14 NOTE — Telephone Encounter (Signed)
Called patient to see if she would be able to make it for her appointment today, no answer. Left HIPAA compliant voicemail requesting callback.   Attempted to call [place] x 3, received busy signal each time.   Beryle Flock, RN

## 2022-03-14 NOTE — Progress Notes (Signed)
[place] [phone]   [phone]  PROGRESS NOTE  Patient Care Team: Pcp, No as PCP - General [name], MD (Internal Medicine)  Hematological/Oncological History # Hodkgin's Lymphoma, Stage III 12/09/2021: CT Abdomen/Pelvis showed retroperitoneal adenopathy, with 2.1 x 3.6 cm node 12/20/2021: establish care with [facility]  12/27/2021: CT abdominal mass biopsy performed, findings consistent with classical Hodgkin's lymphoma. 01/05/2022: PET CT scan showed hypermetabolic abdominal adenopathy consistent with lymphoma.  She has mild hypermetabolism involving the mediastinal and bilateral axillary nodes. 01/17/2022: Cycle 1, Day 1 of AVD chemotherapy 01/31/2022: Cycle 1, Day 15 of AVD chemotherapy  Interval History:  Shellina Kolman 72 y.o. female with medical history significant for newly diagnosed Hodgkin's lymphoma who presents for a follow up visit. The patient's last visit was on 02/27/2022. In the interim she was admitted to the hospital with neutropenic fever and was found to have a port infection.   On exam today Mrs. Pahnke reports she is having a lot of dryness and peeling on her hands and feet.  She reports her hands are improved and her feet are much better after using a lotion.  She reports that she feels like she is getting a little better since her time in the hospital.  She notes her energy level still "are not great".  She reports that she is feeling a little bit stronger.  She is eating well and eating plenty of food.  She notes that she is not having any nausea or vomiting but does have a little bit of loose stools on occasion.  She is not having any overt signs of bleeding.  She denies any lymphadenopathy and is not having any fevers, chills, sweats.  A full 10 point ROS is otherwise negative.  Due to her port removal chemotherapy will need to be held today.  We will have the port placed back in and I discussed this with  infectious disease.  Will plan to see her back in 2 weeks time to restart her chemotherapy regimen.  MEDICAL HISTORY:  Past Medical History:  Diagnosis Date   AKI (acute kidney injury) ([place]) 12/17/2017   Allergic rhinitis    Anemia    Arthritis    Asthma    Autonomic neuropathy    Depression    Diabetes mellitus    Diabetes mellitus without complication (HCC)    E coli infection    GERD (gastroesophageal reflux disease)    GERD (gastroesophageal reflux disease)    Gout    Hepatitis A    High cholesterol    Hypertension    Insomnia    Kidney failure    Metabolic encephalopathy    Morbid obesity (HCC)    NASH (nonalcoholic steatohepatitis)    Pneumonia 12/17/2017   Schizophrenia ([place])    Stroke ([place])    Vitamin B deficiency    Vitamin D deficiency     SURGICAL HISTORY: Past Surgical History:  Procedure Laterality Date   CYST EXCISION     buttucks   DENTAL SURGERY     IR IMAGING GUIDED PORT INSERTION  01/10/2022   IR REMOVAL TUN ACCESS W/ PORT W/O FL MOD SED  02/28/2022   TONSILLECTOMY      SOCIAL HISTORY: Social History   Socioeconomic History   Marital status: Unknown    Spouse name: Not on file   Number of children: 2   Years of education: Not on file   Highest education level: Not on file  Occupational History  Occupation: retired/disabled  Tobacco Use   Smoking status: Never   Smokeless tobacco: Never  Vaping Use   Vaping Use: Never used  Substance and Sexual Activity   Alcohol use: Never   Drug use: Never   Sexual activity: Not Currently    Birth control/protection: None  Other Topics Concern   Not on file  Social History Narrative   ** Merged History Encounter **       Social Determinants of Health   Financial Resource Strain: Not on file  Food Insecurity: No Food Insecurity (02/26/2022)   Hunger Vital Sign    Worried About Running Out of Food in the Last Year: Never true    Ran Out of Food in the Last Year: Never true  Transportation  Needs: No Transportation Needs (02/26/2022)   PRAPARE - Hydrologist (Medical): No    Lack of Transportation (Non-Medical): No  Physical Activity: Not on file  Stress: Not on file  Social Connections: Not on file  Intimate Partner Violence: Not At Risk (02/26/2022)   Humiliation, Afraid, Rape, and Kick questionnaire    Fear of Current or Ex-Partner: No    Emotionally Abused: No    Physically Abused: No    Sexually Abused: No    FAMILY HISTORY: Family History  Adopted: Yes  Problem Relation Age of Onset   COPD Daughter     ALLERGIES:  is allergic to [place] [aprepitant], vancomycin, fosaprepitant, lasix [furosemide], and lasix [furosemide].  MEDICATIONS:  Current Outpatient Medications  Medication Sig Dispense Refill   acetaminophen (TYLENOL) 500 MG tablet Take 1,000 mg by mouth 3 (three) times daily as needed for moderate pain.     albuterol (VENTOLIN HFA) 108 (90 Base) MCG/ACT inhaler Inhale 2 puffs into the lungs every 4 (four) hours as needed for wheezing or shortness of breath.     allopurinol (ZYLOPRIM) 100 MG tablet Take 100 mg by mouth daily.     amLODipine (NORVASC) 10 MG tablet Take 10 mg by mouth daily.     calamine lotion Apply 1 Application topically 2 (two) times daily.  0   carvedilol (COREG) 6.25 MG tablet Take 6.25 mg by mouth 2 (two) times daily with a meal.     cefTRIAXone (ROCEPHIN) IVPB Inject 2 g into the vein daily. Indication:  port infection First Dose: No Last Day of Therapy:  03/14/2022 Labs - Once weekly:  CBC/D and BMP, Labs - Every other week:  ESR and CRP Method of administration: IV Push Method of administration may be changed at the discretion of home infusion pharmacist based upon assessment of the patient and/or caregiver's ability to self-administer the medication ordered. 12 Units 0   cholecalciferol (VITAMIN D3) 25 MCG (1000 UNIT) tablet Take 2,000 Units by mouth daily.     daptomycin (CUBICIN) IVPB Inject 500 mg into  the vein daily for 12 days. Indication:  port infection First Dose: No Last Day of Therapy:  03/14/2022 Labs - Once weekly:  CBC/D, BMP, and CPK Labs - Every other week:  ESR and CRP Method of administration: IV Push Method of administration may be changed at the discretion of home infusion pharmacist based upon assessment of the patient and/or caregiver's ability to self-administer the medication ordered. 12 Units 0   doxepin (SINEQUAN) 25 MG capsule Take 25 mg by mouth at bedtime.     famotidine (PEPCID) 20 MG tablet Take 20 mg by mouth 2 (two) times daily.     fluticasone (FLONASE)  50 MCG/ACT nasal spray Place 1 spray into both nostrils daily.     guaiFENesin (MUCINEX) 600 MG 12 hr tablet Take 600 mg by mouth every 12 (twelve) hours.     hydrOXYzine (ATARAX/VISTARIL) 25 MG tablet Take 25 mg by mouth in the morning and at bedtime.     insulin aspart (NOVOLOG) 100 UNIT/ML injection Inject 0-12 Units into the skin See admin instructions. Inject 0-12 units into the skin 4 times a day (before meals and at bedtime) per sliding scale: BGL <60 or >400  = CALL MD; 60-200 = 0 units; 201-250 = 2 units; 251-300 = 4 units; 301-350 = 6 units; 351-400 = 8 units; 401-450 = 10 units; >450 = 12 units     lactose free nutrition (BOOST) LIQD Take 237 mLs by mouth 2 (two) times daily between meals.     LANTUS SOLOSTAR 100 UNIT/ML Solostar Pen Inject 28-38 Units into the skin 2 (two) times daily. Inject 38 units in the morning and Inject 28 units at bedtime     lidocaine 4 % Place 1 patch onto the skin daily.     lidocaine-prilocaine (EMLA) cream Apply 1 Application topically as needed. (Patient taking differently: Apply 1 Application topically as needed (access port).) 30 g 0   lip balm (CARMEX) ointment Apply 1 Application topically as needed for lip care (cold sores). 7 g 0   lisinopril (ZESTRIL) 5 MG tablet Take 5 mg by mouth daily.     loperamide (IMODIUM) 2 MG capsule Take 1 capsule (2 mg total) by mouth as  needed for diarrhea or loose stools. 30 capsule 0   loratadine (CLARITIN) 10 MG tablet Take 10 mg by mouth daily.     miconazole nitrate (MICATIN) POWD Apply 1 Application topically 2 (two) times daily.  0   Mouthwashes (MOUTH RINSE) LIQD solution 15 mLs by Mouth Rinse route as needed (for oral care).  0   mupirocin ointment (BACTROBAN) 2 % Apply 1 Application topically 2 (two) times daily.     Nystatin (GERHARDT'S BUTT CREAM) CREA Apply 1 Application topically 4 (four) times daily.     nystatin (MYCOSTATIN) 100000 UNIT/ML suspension Take 5 mLs (500,000 Units total) by mouth 4 (four) times daily. 60 mL 0   Pollen Extracts (PROSTAT PO) Take 30 mLs by mouth in the morning and at bedtime.     Polyethyl Glycol-Propyl Glycol (SYSTANE ULTRA) 0.4-0.3 % SOLN Place 2 drops into both eyes in the morning and at bedtime.     polyethylene glycol (MIRALAX / GLYCOLAX) 17 g packet Take 17 g by mouth daily as needed for mild constipation.     prochlorperazine (COMPAZINE) 10 MG tablet Take 1 tablet (10 mg total) by mouth every 6 (six) hours as needed for nausea or vomiting. 30 tablet 0   Simethicone (GAS-X ULTRA STRENGTH) 180 MG CAPS Take 1 capsule by mouth every 8 (eight) hours as needed (gas).     Skin Protectants, Misc. (EUCERIN) cream Apply 1 Application topically 2 (two) times daily. Where skin is peeling     traMADol (ULTRAM) 50 MG tablet Take 1 tablet (50 mg total) by mouth every 6 (six) hours as needed for moderate pain. 30 tablet 0   zinc oxide 20 % ointment Apply 1 Application topically See admin instructions. Every shift and as needed (bilateral buttock)     No current facility-administered medications for this visit.    REVIEW OF SYSTEMS:   Constitutional: ( - ) fevers, ( - )  chills , ( - )  night sweats Eyes: ( - ) blurriness of vision, ( - ) double vision, ( - ) watery eyes Ears, nose, mouth, throat, and face: ( - ) mucositis, ( - ) sore throat Respiratory: ( - ) cough, ( - ) dyspnea, ( - )  wheezes Cardiovascular: ( - ) palpitation, ( - ) chest discomfort, ( - ) lower extremity swelling Gastrointestinal:  ( +) nausea, ( - ) heartburn, ( - ) change in bowel habits Skin: ( - ) abnormal skin rashes Lymphatics: ( - ) new lymphadenopathy, ( - ) easy bruising Neurological: ( - ) numbness, ( - ) tingling, ( - ) new weaknesses Behavioral/Psych: ( - ) mood change, ( - ) new changes  All other systems were reviewed with the patient and are negative.  PHYSICAL EXAMINATION: ECOG PERFORMANCE STATUS: 2 - Symptomatic, <50% confined to bed  Vitals:   03/14/22 1045  BP: 109/64  Pulse: 77  Resp: 14  Temp: 98.7 F (37.1 C)  SpO2: 99%    Filed Weights     GENERAL: Well-appearing elderly female, alert, no distress and comfortable SKIN: skin color, texture, turgor are normal, no rashes or significant lesions EYES: conjunctiva are pink and non-injected, sclera clear LUNGS: clear to auscultation and percussion with normal breathing effort HEART: regular rate & rhythm and no murmurs and no lower extremity edema Musculoskeletal: no cyanosis of digits and no clubbing  PSYCH: alert & oriented x 3, fluent speech NEURO: no focal motor/sensory deficits  LABORATORY DATA:  I have reviewed the data as listed    Latest Ref Rng & Units 03/14/2022   10:08 AM 03/03/2022    9:39 AM 03/02/2022    4:50 AM  CBC  WBC 4.0 - 10.5 K/uL 10.8  10.6  5.3   Hemoglobin 12.0 - 15.0 g/dL 10.1  8.7  9.6   Hematocrit 36.0 - 46.0 % 32.1  26.9  29.8   Platelets 150 - 400 K/uL 209  136  107        Latest Ref Rng & Units 03/14/2022   10:08 AM 03/03/2022    9:39 AM 03/02/2022    4:50 AM  CMP  Glucose 70 - 99 mg/dL 169  160  203   BUN 8 - 23 mg/dL '17  14  13   '$ Creatinine 0.44 - 1.00 mg/dL 0.77  0.76  0.73   Sodium 135 - 145 mmol/L 138  135  137   Potassium 3.5 - 5.1 mmol/L 4.2  3.7  3.4   Chloride 98 - 111 mmol/L 104  99  102   CO2 22 - 32 mmol/L '25  23  23   '$ Calcium 8.9 - 10.3 mg/dL 9.2  7.8  7.9   Total  Protein 6.5 - 8.1 g/dL 6.4  5.0  5.4   Total Bilirubin 0.3 - 1.2 mg/dL 0.6  1.4  0.9   Alkaline Phos 38 - 126 U/L 295  298  355   AST 15 - 41 U/L 20  33  24   ALT 0 - 44 U/L 23  31  44     RADIOGRAPHIC STUDIES: [place] EKG SITE RITE  Result Date: 03/03/2022 If Site Rite image not attached, placement could not be confirmed due to current cardiac rhythm.  DG Chest Port 1 View  Result Date: 03/03/2022 CLINICAL DATA:  Provided history: Questionable sepsis-evaluate for abnormality. EXAM: PORTABLE CHEST 1 VIEW COMPARISON:  Prior chest radiographs 02/23/2022 and earlier. FINDINGS: The cardiomediastinal silhouette is unchanged. Aortic atherosclerosis.  Chronic elevation of the right hemidiaphragm. Focus of linear atelectasis within the mid right lung. No appreciable airspace consolidation or pulmonary edema. No evidence of pleural effusion or pneumothorax. No acute bony abnormality identified. IMPRESSION: 1. No evidence of airspace consolidation. 2. Focus of linear atelectasis within the mid right lung. 3. Chronic elevation of the right hemidiaphragm. 4. Aortic Atherosclerosis (ICD10-I70.0). Electronically Signed   By: Kellie Simmering D.O.   On: 03/03/2022 10:26   IR REMOVAL TUN ACCESS W/ PORT W/O FL MOD SED  Result Date: 02/28/2022 INDICATION: Patient has a history of Hodgkin's lymphoma admitted for neutropenic fevers. Team is requesting Port-A-Cath removal due to concern for infection. EXAM: REMOVAL OF IMPLANTED TUNNELED PORT-A-CATH MEDICATIONS: LIDOCAINE 1% 5 ML ANESTHESIA/SEDATION: Local anesthetic was administered. FLUOROSCOPY: None PROCEDURE: Informed written consent was obtained from the patient after thorough discussion of the procedure risks, benefits and alternatives. All questions were addressed. Maximum sterile barrier technique was utilized including caps, masks, sterile gowns, sterile gloves, sterile drape, hand hygiene and skin antiseptic. A time-out was performed prior to the initiation of the  procedure The RIGHT chest was prepped and draped in a sterile fashion. Lidocaine was utilized for local anesthetic. Port-A-Cath was palpated and an incision was made just superior. Utilizing blunt dissection the Port-A-Cath and reservoir were removed with the underlying subcutaneous tissue and their entirety. The pocket was then cleaned out with sterile normal saline The pocket was closed with 3 interrupted 4.0 Vicryl sutures and a 4.0 Vicryl running subcuticular stitch was utilized to approximate the skin. Dermabond was applied. Dressing was applied. The patient tolerated the procedure well without immediate post procedural complication. FINDINGS: Successful removal of implant Port-A-Cath without immediate post procedural complication. IMPRESSION: Successful removal of a RIGHT chest implanted Port-A-Cath. A tip culture was submitted per Infectious Disease request. Read by: Rushie Nyhan, NP Electronically Signed   By: Michaelle Birks M.D.   On: 02/28/2022 16:49   CT Head Wo Contrast  Result Date: 02/24/2022 CLINICAL DATA:  Mental status change, unknown cause EXAM: CT HEAD WITHOUT CONTRAST TECHNIQUE: Contiguous axial images were obtained from the base of the skull through the vertex without intravenous contrast. RADIATION DOSE REDUCTION: This exam was performed according to the departmental dose-optimization program which includes automated exposure control, adjustment of the mA and/or kV according to patient size and/or use of iterative reconstruction technique. COMPARISON:  CT head June 14, 21. FINDINGS: Brain: No evidence of acute infarction, hemorrhage, hydrocephalus, extra-axial collection or mass lesion/mass effect. Vascular: No hyperdense vessel. Skull: No acute fracture. Sinuses/Orbits: Clear sinuses.  No acute orbital findings. Other: No mastoid effusions. IMPRESSION: No evidence of acute intracranial abnormality. Electronically Signed   By: Margaretha Sheffield M.D.   On: 02/24/2022 14:54   CT ABDOMEN  PELVIS W CONTRAST  Result Date: 02/23/2022 CLINICAL DATA:  Hodgkin's lymphoma. Sacral wound, fever, hypoxia and lethargy. * Tracking Code: BO * EXAM: CT ABDOMEN AND PELVIS WITH CONTRAST TECHNIQUE: Multidetector CT imaging of the abdomen and pelvis was performed using the standard protocol following bolus administration of intravenous contrast. RADIATION DOSE REDUCTION: This exam was performed according to the departmental dose-optimization program which includes automated exposure control, adjustment of the mA and/or kV according to patient size and/or use of iterative reconstruction technique. CONTRAST:  174m OMNIPAQUE IOHEXOL 300 MG/ML  SOLN COMPARISON:  02/10/2022 CT scan FINDINGS: Lower chest: Mild linear subsegmental atelectasis in the right lower lobe. Stable focal linear scarring in the lingula. Descending thoracic aortic atherosclerosis. Hepatobiliary: Borderline distention of the gallbladder.  Small gallstones are present dependently in the gallbladder. Pancreas: Unremarkable Spleen: There is a anterolateral band of hypodensity extending in the spleen on images 20-25 of series 2 which is new compared to prior exams. Possibilities include heterogeneity related to early contrast phase, a small splenic infarct, a lymphoma related lesion with atypical morphology, or less likely a small splenic laceration. No perisplenic ascites to further suggest recent splenic trauma. Adrenals/Urinary Tract: The urinary bladder extends 1.4 cm below the pubococcygeal line, compatible with pelvic floor laxity and cystocele. The kidneys and adrenal glands appear unremarkable. Stomach/Bowel: Sigmoid colon diverticulosis. Otherwise unremarkable. Vascular/Lymphatic: Atherosclerosis is present, including aortoiliac atherosclerotic disease. Atheromatous plaque at the origins of the celiac trunk and SMA may be causing some degree of stenosis, but both vessels opacify and accordingly no overt occlusion is identified. There is  atheromatous plaque proximally in both renal arteries. Retroperitoneal adenopathy is present, index left periaortic lymph node 1.5 cm in short axis on image 41 series 2, formerly the same. Overall no substantive change in the visualized adenopathy. Reproductive: No significant findings Other: No supplemental non-categorized findings. Musculoskeletal: Dextroconvex lumbar scoliosis with rotary component. Sclerotic Schmorl's nodes and degenerative endplate findings at several lumbar levels. Right eccentric vertical narrowing of the L5 vertebral body as before. Multilevel lumbar impingement due to spondylosis and degenerative disc disease. Focal band of subcutaneous edema extends from the left buttock towards the left posterior iliac bone on image 60 of series 2, with associated overlying cutaneous thickening. This has increased substantially since 02/10/2022 and likely rep reflects the reported decubitus ulcer. Although this extends faintly towards the periosteal margin, we do not demonstrate a deep gas-filled cavity, and there is no bony demineralization or evidence of underlying bony involvement to suggest osteomyelitis. Along the top of the gluteal cleft, there is also a suspected ulceration or lesion extending down towards the sacrum on images 66 through 72 of series 2, increased from prior. Although this abuts the posterior sacral margin there are no findings of bony demineralization or osteomyelitis. Small radiodensity along the associated wound may represent bandaging. No drainable abscess observed. IMPRESSION: 1. Decubitus ulcers along the left buttock extending towards the left posterior iliac bone, and along the upper margin of the gluteal cleft. Both have edema extending towards the adjacent bony structures but no findings of osteomyelitis or separate drainable abscess. 2. New band of hypodensity extending in the spleen, differential diagnostic considerations include small splenic infarct, heterogeneous  enhancement related to early contrast phase, lymphoma related lesion with atypical morphology, or less likely a small splenic laceration. No perisplenic ascites to further suggest recent splenic trauma. 3. Stable retroperitoneal adenopathy. 4. Pelvic floor laxity and cystocele. 5. Sigmoid colon diverticulosis. 6. Cholelithiasis. 7. Atheromatous plaque at the origins of the celiac trunk and SMA may be causing some degree of stenosis, but both vessels opacify and accordingly no overt occlusion is identified. 8. Dextroconvex lumbar scoliosis with rotary component. Multilevel lumbar impingement due to spondylosis and degenerative disc disease. 9. Aortic atherosclerosis. Aortic Atherosclerosis (ICD10-I70.0). Electronically Signed   By: Van Clines M.D.   On: 02/23/2022 15:03   DG Chest Port 1 View  Result Date: 02/23/2022 CLINICAL DATA:  Questionable sepsis - evaluate for abnormality EXAM: PORTABLE CHEST 1 VIEW COMPARISON:  February 10, 2022 FINDINGS: The cardiomediastinal silhouette is unchanged in contour.RIGHT chest port with tip terminating over the superior cavoatrial junction. Atherosclerotic calcifications. No pleural effusion. No pneumothorax. No acute pleuroparenchymal abnormality. IMPRESSION: No acute cardiopulmonary abnormality. Electronically Signed   By: Colletta Maryland  Peacock M.D.   On: 02/23/2022 13:59    ASSESSMENT & PLAN Heather Petty 72 y.o. female with medical history significant for newly diagnosed Hodgkin's lymphoma who presents for a follow up visit.   #Hodgkin's Lymphoma, Stage III --biopsy of abdominal lymph node on 12/27/2021 showed classical Hodgkin's lymphoma.  --Pre-treatment PET scan from [facility] abdominal adenopathy along with mild hypermetabolism involving mediastinal and bilateral axillary nodes. --Recommend AVD chemotherapy x 2 cycles followed by PET CT scan.  PLAN: --today patient is due for Cycle 2, Day 1 of AVD chemotherapy. HELD as she does not currently have  access.  --Labs from today show white blood cell 10.8, hemoglobin 10.1, MCV 95, and platelets of 209 --AVD chemotherapy typically given without GCSF support but given her neutropenic fever she will require it moving forward.  --RTC after placement of port for Cycle 2 Day 1 of AVD chemotherapy.   #Sigmoid colonic focal hypermetabolism #Hepatic steatosis with possible cirrhosis --Will request follow up with GI to further evaluate.  #Supportive Care -- chemotherapy education complete -- port placement complete, however was removed while inpatient due to infection.  Have placed order to have this placed again.  --baseline echo from 01/06/2022 showed EF 60-65% -- not planning for bleomycin, no need for PFTs.  --  compazine '10mg'$  PO q6H for nausea (zofran contraindicated due to QT prolongated medications).  -- allopurinol '300mg'$  PO daily for TLS prophylaxis -- EMLA cream for port -- no pain medication required at this time.    Orders Placed This Encounter  Procedures   IR IMAGING GUIDED PORT INSERTION    Standing Status:   Future    Standing Expiration Date:   03/15/2023    Order Specific Question:   Reason for Exam (SYMPTOM  OR DIAGNOSIS REQUIRED)    Answer:   Hodgkin lymphoma, requires port for treatment. Prior port removed due to infection    Order Specific Question:   Preferred Imaging Location?    Answer:   [facility]    All questions were answered. The patient knows to call the clinic with any problems, questions or concerns.  I have spent a total of 30 minutes minutes of face-to-face and non-face-to-face time, preparing to see the patient, performing a medically appropriate examination, counseling and educating the patient, ordering tests/procedures, communicating with other health care professionals, documenting clinical information in the electronic health record,  and care coordination.   Ledell Peoples, MD Department of Hematology/Oncology [place] [facility]  [facility] Phone: [phone] Pager: [phone] Email: [email or web address]    03/14/2022 4:38 PM

## 2022-03-15 ENCOUNTER — Telehealth: Payer: Self-pay

## 2022-03-15 ENCOUNTER — Other Ambulatory Visit: Payer: Self-pay

## 2022-03-15 NOTE — Telephone Encounter (Signed)
Rcvd VM from [facility] from [place] in regards to pt's upcoming appt tomorrow. Called back req for scheduling and no answer after transfer or VM available.

## 2022-03-16 ENCOUNTER — Other Ambulatory Visit: Payer: Self-pay

## 2022-03-16 ENCOUNTER — Encounter: Payer: Self-pay | Admitting: Hematology and Oncology

## 2022-03-16 ENCOUNTER — Inpatient Hospital Stay: Payer: Medicare Other

## 2022-03-16 ENCOUNTER — Ambulatory Visit (INDEPENDENT_AMBULATORY_CARE_PROVIDER_SITE_OTHER): Payer: Medicare Other | Admitting: Internal Medicine

## 2022-03-16 ENCOUNTER — Encounter: Payer: Self-pay | Admitting: Physician Assistant

## 2022-03-16 VITALS — BP 103/69 | HR 75 | Temp 99.9°F

## 2022-03-16 DIAGNOSIS — D709 Neutropenia, unspecified: Secondary | ICD-10-CM

## 2022-03-16 DIAGNOSIS — R5081 Fever presenting with conditions classified elsewhere: Secondary | ICD-10-CM

## 2022-03-16 NOTE — Progress Notes (Signed)
Midline Removal    Midline length & location:  left arm, reported to be 10 cm; removed 15 cm Removed per verbal order from: Dr. Candiss Norse  Blood thinners:  none per chart review Platelet count:  209 (03/14/22)  Site assessment: Dressing clean and dry, but peeling up at edges. BioPatch sticking out form underneath dressing. Extremity warm and dry. No redness, drainage, or swelling present at insertion site.   Pre-removal vital signs:  BP:  111/66 HR:  84 SpO2:  96% room air  Insertion site positioned below level of heart. No sutures present. Insertion site cleaned with CHG, catheter removed and petroleum dressing applied. Tip intact. Pressure held until hemostasis achieved.    Length of catheter removed:  15 cm   Provided patient with after care instructions and precautions print out (via [place]). Reviewed this information with patient.   Patient verbalized understanding and agreement, all questions answered. Patient tolerated procedure well and remained in clinic under the care of RN 30 minutes post removal. Sent after care instructions back to facility with patient.   Post-observation vital signs:  BP:  103/69 HR:  75 SpO2:  99% room air  Notified [facility] team of removal.  Beryle Flock, RN

## 2022-03-16 NOTE — Progress Notes (Signed)
Patient: Heather Petty  DOB: [birth year] MRN: [id number] PCP: Pcp, No   Patient Active Problem List   Diagnosis Date Noted   Neutropenic fever ([place]) 02/23/2022   Port-A-Cath in place 02/15/2022   Febrile neutropenia ([place]) 02/11/2022   Hodgkin lymphoma ([place]) 01/10/2022   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   Candidal intertrigo 09/25/2019   CAP (community acquired pneumonia) 09/03/2019   Uncontrolled type 2 diabetes mellitus with hyperglycemia ([place]) 09/03/2019   Elevated LFTs 09/03/2019   Essential hypertension 09/03/2019   Gout 09/03/2019   Asthma 09/03/2019   Anemia 09/03/2019   Pneumonia 09/03/2019   Splenomegaly    Anemia 08/05/2019   Diarrhea 08/05/2019   DKA (diabetic ketoacidoses) 07/22/2019   Rhabdomyolysis 93/23/5573   Acute metabolic encephalopathy 22/03/5425   MRSA bacteremia 07/22/2019   Enterococcal bacteremia 07/22/2019   Vitiligo 07/22/2019   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   Sepsis ([place]) 07/19/2018   Ventricular tachycardia, sustained ([place]) 07/19/2018   NSTEMI (non-ST elevated myocardial infarction) ([place]) 07/19/2018   Wound infection 04/03/2018   Morbid obesity with BMI of 50.0-59.9, adult ([place]) 04/03/2018   Elevated alkaline phosphatase level 04/03/2018   Asthma exacerbation 03/14/2018   Hypotension 03/14/2018   Lactic acidosis 03/14/2018   GERD (gastroesophageal reflux disease) 03/14/2018   Acute renal failure superimposed on stage 3 chronic kidney disease ([place]) 03/14/2018   Macrocytic anemia 03/14/2018   Abnormal LFTs 03/14/2018   Acute kidney injury ([place]) 02/28/2018   Wound of sacral region 02/28/2018   Hypothermia 02/28/2018   Stage II pressure ulcer of sacral region ([place]) 02/15/2018   DM II (diabetes mellitus, type II), controlled ([place]) 02/09/2018   Schizophrenia ([place]) 02/09/2018   Decubitus ulcer of right leg 02/06/2018   Acute renal failure (ARF) ([place]) 02/05/2018   Hyperlipidemia associated with type 2 diabetes mellitus ([place])  02/05/2018   Hyperkalemia 02/05/2018   Dermatitis 02/05/2018   Open back wound 02/05/2018   Dysuria 02/05/2018   Paranoid schizophrenia ([place])    AKI (acute kidney injury) ([place]) 12/17/2017   Type II diabetes mellitus with renal manifestations ([place]) 12/17/2017   HTN (hypertension) 12/17/2017   Gout      Subjective:  Heather Petty is a 73 y.o. F with past medical history of diabetes, schizophrenia, sacral ulcer, thigh ulcer, Hodgkin's lymphoma s/p chemotherapy with most recent dose on 1/10, recent admission for neutropenic fever presents for hospital follow-up neutropenic fever.  Patient presented from SNF with temp of 103 and mental status changes on 1/19 to [place] long.  Blood cultures have been negative.  Noted that Was placed on 12/5, since then patient has had 2 admissions for neutropenic fever.  Hospital course was also complicated by skin findings of excoriating rash throughout.  Initially treated with the vancomycin and cefepime then transition to oral Doxy Augmentin x 1 day.  Port was removed and patient transition to daptomycin and ceftriaxone to complete 2 weeks of antibiotics) EOT 03/14/2022. Today: Pt notes overall she feels better. No fevers since discharge. SH is using lotion which helps her with dry skin.   Review of Systems  All other systems reviewed and are negative.   Past Medical History:  Diagnosis Date   AKI (acute kidney injury) ([place]) 12/17/2017   Allergic rhinitis    Anemia    Arthritis    Asthma    Autonomic neuropathy    Depression    Diabetes mellitus    Diabetes mellitus without complication ([place])    E coli infection    GERD (gastroesophageal reflux  disease)    GERD (gastroesophageal reflux disease)    Gout    Hepatitis A    High cholesterol    Hypertension    Insomnia    Kidney failure    Metabolic encephalopathy    Morbid obesity (HCC)    NASH (nonalcoholic steatohepatitis)    Pneumonia 12/17/2017   Schizophrenia ([place])    Stroke ([place])     Vitamin B deficiency    Vitamin D deficiency     Outpatient Medications Prior to Visit  Medication Sig Dispense Refill   acetaminophen (TYLENOL) 500 MG tablet Take 1,000 mg by mouth 3 (three) times daily as needed for moderate pain.     albuterol (VENTOLIN HFA) 108 (90 Base) MCG/ACT inhaler Inhale 2 puffs into the lungs every 4 (four) hours as needed for wheezing or shortness of breath.     allopurinol (ZYLOPRIM) 100 MG tablet Take 100 mg by mouth daily.     amLODipine (NORVASC) 10 MG tablet Take 10 mg by mouth daily.     calamine lotion Apply 1 Application topically 2 (two) times daily.  0   carvedilol (COREG) 6.25 MG tablet Take 6.25 mg by mouth 2 (two) times daily with a meal.     cefTRIAXone (ROCEPHIN) IVPB Inject 2 g into the vein daily. Indication:  port infection First Dose: No Last Day of Therapy:  03/14/2022 Labs - Once weekly:  CBC/D and BMP, Labs - Every other week:  ESR and CRP Method of administration: IV Push Method of administration may be changed at the discretion of home infusion pharmacist based upon assessment of the patient and/or caregiver's ability to self-administer the medication ordered. 12 Units 0   cholecalciferol (VITAMIN D3) 25 MCG (1000 UNIT) tablet Take 2,000 Units by mouth daily.     doxepin (SINEQUAN) 25 MG capsule Take 25 mg by mouth at bedtime.     famotidine (PEPCID) 20 MG tablet Take 20 mg by mouth 2 (two) times daily.     fluticasone (FLONASE) 50 MCG/ACT nasal spray Place 1 spray into both nostrils daily.     guaiFENesin (MUCINEX) 600 MG 12 hr tablet Take 600 mg by mouth every 12 (twelve) hours.     hydrOXYzine (ATARAX/VISTARIL) 25 MG tablet Take 25 mg by mouth in the morning and at bedtime.     insulin aspart (NOVOLOG) 100 UNIT/ML injection Inject 0-12 Units into the skin See admin instructions. Inject 0-12 units into the skin 4 times a day (before meals and at bedtime) per sliding scale: BGL <60 or >400  = CALL MD; 60-200 = 0 units; 201-250 = 2 units;  251-300 = 4 units; 301-350 = 6 units; 351-400 = 8 units; 401-450 = 10 units; >450 = 12 units     lactose free nutrition (BOOST) LIQD Take 237 mLs by mouth 2 (two) times daily between meals.     LANTUS SOLOSTAR 100 UNIT/ML Solostar Pen Inject 28-38 Units into the skin 2 (two) times daily. Inject 38 units in the morning and Inject 28 units at bedtime     lidocaine 4 % Place 1 patch onto the skin daily.     lidocaine-prilocaine (EMLA) cream Apply 1 Application topically as needed. (Patient taking differently: Apply 1 Application topically as needed (access port).) 30 g 0   lip balm (CARMEX) ointment Apply 1 Application topically as needed for lip care (cold sores). 7 g 0   lisinopril (ZESTRIL) 5 MG tablet Take 5 mg by mouth daily.     loperamide (  IMODIUM) 2 MG capsule Take 1 capsule (2 mg total) by mouth as needed for diarrhea or loose stools. 30 capsule 0   loratadine (CLARITIN) 10 MG tablet Take 10 mg by mouth daily.     miconazole nitrate (MICATIN) POWD Apply 1 Application topically 2 (two) times daily.  0   Mouthwashes (MOUTH RINSE) LIQD solution 15 mLs by Mouth Rinse route as needed (for oral care).  0   mupirocin ointment (BACTROBAN) 2 % Apply 1 Application topically 2 (two) times daily.     Nystatin (GERHARDT'S BUTT CREAM) CREA Apply 1 Application topically 4 (four) times daily.     nystatin (MYCOSTATIN) 100000 UNIT/ML suspension Take 5 mLs (500,000 Units total) by mouth 4 (four) times daily. 60 mL 0   Pollen Extracts (PROSTAT PO) Take 30 mLs by mouth in the morning and at bedtime.     Polyethyl Glycol-Propyl Glycol (SYSTANE ULTRA) 0.4-0.3 % SOLN Place 2 drops into both eyes in the morning and at bedtime.     polyethylene glycol (MIRALAX / GLYCOLAX) 17 g packet Take 17 g by mouth daily as needed for mild constipation.     prochlorperazine (COMPAZINE) 10 MG tablet Take 1 tablet (10 mg total) by mouth every 6 (six) hours as needed for nausea or vomiting. 30 tablet 0   Simethicone (GAS-X ULTRA  STRENGTH) 180 MG CAPS Take 1 capsule by mouth every 8 (eight) hours as needed (gas).     Skin Protectants, Misc. (EUCERIN) cream Apply 1 Application topically 2 (two) times daily. Where skin is peeling     traMADol (ULTRAM) 50 MG tablet Take 1 tablet (50 mg total) by mouth every 6 (six) hours as needed for moderate pain. 30 tablet 0   zinc oxide 20 % ointment Apply 1 Application topically See admin instructions. Every shift and as needed (bilateral buttock)     No facility-administered medications prior to visit.     Allergies  Allergen Reactions   Emend [Aprepitant] Other (See Comments)    Pt experienced shooting back pain at 10/10 when medication was administered the first time. See Hypersensitivity note from 01/17/2022.   Vancomycin Other (See Comments)    Patient becomes hypotensive, lethargic and itchy. Has tolerated it many times.   Fosaprepitant Other (See Comments)    Back pain / hypersensitivity 01/17/2022   Lasix [Furosemide] Other (See Comments)    IV-LASIX ==> Reaction: Burning   Lasix [Furosemide] Other (See Comments)    On MAR    Social History   Tobacco Use   Smoking status: Never   Smokeless tobacco: Never  Vaping Use   Vaping Use: Never used  Substance Use Topics   Alcohol use: Never   Drug use: Never    Family History  Adopted: Yes  Problem Relation Age of Onset   COPD Daughter     Objective:  There were no vitals filed for this visit. There is no height or weight on file to calculate BMI.  Physical Exam Constitutional:      Appearance: Normal appearance.  HENT:     Head: Normocephalic and atraumatic.     Right Ear: Tympanic membrane normal.     Left Ear: Tympanic membrane normal.     Nose: Nose normal.     Mouth/Throat:     Mouth: Mucous membranes are moist.  Eyes:     Extraocular Movements: Extraocular movements intact.     Conjunctiva/sclera: Conjunctivae normal.     Pupils: Pupils are equal, round, and reactive to light.  Cardiovascular:      Rate and Rhythm: Normal rate and regular rhythm.     Heart sounds: No murmur heard.    No friction rub. No gallop.  Pulmonary:     Effort: Pulmonary effort is normal.     Breath sounds: Normal breath sounds.  Abdominal:     General: Abdomen is flat.     Palpations: Abdomen is soft.  Musculoskeletal:        General: Normal range of motion.  Skin:    General: Skin is warm and dry.  Neurological:     General: No focal deficit present.     Mental Status: She is alert and oriented to person, place, and time.  Psychiatric:        Mood and Affect: Mood normal.     Lab Results: Lab Results  Component Value Date   WBC 10.8 (H) 03/14/2022   HGB 10.1 (L) 03/14/2022   HCT 32.1 (L) 03/14/2022   MCV 95.0 03/14/2022   PLT 209 03/14/2022    Lab Results  Component Value Date   CREATININE 0.77 03/14/2022   BUN 17 03/14/2022   NA 138 03/14/2022   K 4.2 03/14/2022   CL 104 03/14/2022   CO2 25 03/14/2022    Lab Results  Component Value Date   ALT 23 03/14/2022   AST 20 03/14/2022   GGT 190 (H) 08/08/2019   ALKPHOS 295 (H) 03/14/2022   BILITOT 0.6 03/14/2022     Assessment & Plan:  #Neutropenic fever 2/2 suspected port infection with negative blood Cx #Skin excoriations -Given this with patient second episode neutropenic fever and port was placed on 12/5, plan was to remove port(blood Cx negative).  She was transitioned to daptomycin and ceftriaxone from port removal EOT 03/14/2022 while PICC line. -Remove midline. Communicated with Heme-onc ok to place port - Follow-up PRN  # Skin excoriation #Decubitus ulcer-no c/f infection -improved with lotion -getting wound care for bottom  Laurice Record, [place] [facility]   03/16/22  6:14 AM   I have personally spent 41 minutes involved in face-to-face and non-face-to-face activities for this patient on the day of the visit. Professional time spent includes the following  activities: Preparing to see the patient (review of tests), Obtaining and/or reviewing separately obtained history (admission/discharge record), Performing a medically appropriate examination and/or evaluation , Ordering medications/tests/procedures, referring and communicating with other health care professionals, Documenting clinical information in the EMR, Independently interpreting results (not separately reported), Communicating results to the patient/family/caregiver, Counseling and educating the patient/family/caregiver and Care coordination (not separately reported).

## 2022-03-16 NOTE — Progress Notes (Signed)
[name] [phone]   [phone]  PROGRESS NOTE  Patient Care Team: Pcp, No as PCP - General [name], MD (Internal Medicine)  Hematological/Oncological History # Hodkgin's Lymphoma, Stage III 12/09/2021: CT Abdomen/Pelvis showed retroperitoneal adenopathy, with 2.1 x 3.6 cm node 12/20/2021: establish care with [facility]  12/27/2021: CT abdominal mass biopsy performed, findings consistent with classical Hodgkin's lymphoma. 01/05/2022: PET CT scan showed hypermetabolic abdominal adenopathy consistent with lymphoma.  She has mild hypermetabolism involving the mediastinal and bilateral axillary nodes. 01/17/2022: Cycle 1, Day 1 of AVD chemotherapy 01/31/2022: Cycle 1, Day 15 of AVD chemotherapy 02/15/2022: Cycle 2, Day 1 of AVD chemotherapy  Interval History:  Heather Petty 72 y.o. female with medical history significant for newly diagnosed Hodgkin's lymphoma who presents for a follow up visit. The patient's last visit was on 01/31/2022. In the interim since the last visit she completed Cycle 2 Day 1 of AVD.   On exam today Heather Petty reports she is slowly recovering from recent hospitalization. She is fatigued and more sedentary. She struggles with appetite loss and lost approximatley 5 lbs since starting chemotherapy. She does have some occasional episodes of nausea and vomiting. She denies any abdominal pain. Her constipation is stable and is managed with stool softeners/laxatives. She denies easy bruising or signs of active bleeding. Overall she is willing and able to proceed with chemotherapy treatment at this time.  She denies fevers, chills, sweats, shortness of breath, chest pain or cough. She has no other complaints.  A full 10 point ROS is otherwise negative.  MEDICAL HISTORY:  Past Medical History:  Diagnosis Date   AKI (acute kidney injury) ([place]) 12/17/2017   Allergic rhinitis    Anemia    Arthritis    Asthma    Autonomic  neuropathy    Depression    Diabetes mellitus    Diabetes mellitus without complication (HCC)    E coli infection    GERD (gastroesophageal reflux disease)    GERD (gastroesophageal reflux disease)    Gout    Hepatitis A    High cholesterol    Hypertension    Insomnia    Kidney failure    Metabolic encephalopathy    Morbid obesity (HCC)    NASH (nonalcoholic steatohepatitis)    Pneumonia 12/17/2017   Schizophrenia ([place])    Stroke ([place])    Vitamin B deficiency    Vitamin D deficiency     SURGICAL HISTORY: Past Surgical History:  Procedure Laterality Date   CYST EXCISION     buttucks   DENTAL SURGERY     IR IMAGING GUIDED PORT INSERTION  01/10/2022   IR REMOVAL TUN ACCESS W/ PORT W/O FL MOD SED  02/28/2022   TONSILLECTOMY      SOCIAL HISTORY: Social History   Socioeconomic History   Marital status: Unknown    Spouse name: Not on file   Number of children: 2   Years of education: Not on file   Highest education level: Not on file  Occupational History   Occupation: retired/disabled  Tobacco Use   Smoking status: Never   Smokeless tobacco: Never  Vaping Use   Vaping Use: Never used  Substance and Sexual Activity   Alcohol use: Never   Drug use: Never   Sexual activity: Not Currently    Birth control/protection: None  Other Topics Concern   Not on file  Social History Narrative   ** Merged History Encounter **  Social Determinants of Health   Financial Resource Strain: Not on file  Food Insecurity: No Food Insecurity (02/26/2022)   Hunger Vital Sign    Worried About Running Out of Food in the Last Year: Never true    Ran Out of Food in the Last Year: Never true  Transportation Needs: No Transportation Needs (02/26/2022)   PRAPARE - Hydrologist (Medical): No    Lack of Transportation (Non-Medical): No  Physical Activity: Not on file  Stress: Not on file  Social Connections: Not on file  Intimate Partner Violence: Not  At Risk (02/26/2022)   Humiliation, Afraid, Rape, and Kick questionnaire    Fear of Current or Ex-Partner: No    Emotionally Abused: No    Physically Abused: No    Sexually Abused: No    FAMILY HISTORY: Family History  Adopted: Yes  Problem Relation Age of Onset   COPD Daughter     ALLERGIES:  is allergic to [place] [aprepitant], vancomycin, fosaprepitant, lasix [furosemide], and lasix [furosemide].  MEDICATIONS:  Current Outpatient Medications  Medication Sig Dispense Refill   acetaminophen (TYLENOL) 500 MG tablet Take 1,000 mg by mouth 3 (three) times daily as needed for moderate pain.     albuterol (VENTOLIN HFA) 108 (90 Base) MCG/ACT inhaler Inhale 2 puffs into the lungs every 4 (four) hours as needed for wheezing or shortness of breath.     allopurinol (ZYLOPRIM) 100 MG tablet Take 100 mg by mouth daily.     amLODipine (NORVASC) 10 MG tablet Take 10 mg by mouth daily.     calamine lotion Apply 1 Application topically 2 (two) times daily.  0   carvedilol (COREG) 6.25 MG tablet Take 6.25 mg by mouth 2 (two) times daily with a meal.     cefTRIAXone (ROCEPHIN) IVPB Inject 2 g into the vein daily. Indication:  port infection First Dose: No Last Day of Therapy:  03/14/2022 Labs - Once weekly:  CBC/D and BMP, Labs - Every other week:  ESR and CRP Method of administration: IV Push Method of administration may be changed at the discretion of home infusion pharmacist based upon assessment of the patient and/or caregiver's ability to self-administer the medication ordered. (Patient not taking: Reported on 03/16/2022) 12 Units 0   cholecalciferol (VITAMIN D3) 25 MCG (1000 UNIT) tablet Take 2,000 Units by mouth daily.     doxepin (SINEQUAN) 25 MG capsule Take 25 mg by mouth at bedtime.     famotidine (PEPCID) 20 MG tablet Take 20 mg by mouth 2 (two) times daily.     fluticasone (FLONASE) 50 MCG/ACT nasal spray Place 1 spray into both nostrils daily.     glipiZIDE (GLUCOTROL XL) 10 MG 24 hr  tablet Take 10 mg by mouth daily.     guaiFENesin (MUCINEX) 600 MG 12 hr tablet Take 600 mg by mouth every 12 (twelve) hours.     hydrOXYzine (ATARAX/VISTARIL) 25 MG tablet Take 25 mg by mouth in the morning and at bedtime.     insulin aspart (NOVOLOG) 100 UNIT/ML injection Inject 0-12 Units into the skin See admin instructions. Inject 0-12 units into the skin 4 times a day (before meals and at bedtime) per sliding scale: BGL <60 or >400  = CALL MD; 60-200 = 0 units; 201-250 = 2 units; 251-300 = 4 units; 301-350 = 6 units; 351-400 = 8 units; 401-450 = 10 units; >450 = 12 units     lactose free nutrition (BOOST) LIQD Take  237 mLs by mouth 2 (two) times daily between meals.     LANTUS SOLOSTAR 100 UNIT/ML Solostar Pen Inject 28-38 Units into the skin 2 (two) times daily. Inject 38 units in the morning and Inject 28 units at bedtime     lidocaine 4 % Place 1 patch onto the skin daily.     lidocaine-prilocaine (EMLA) cream Apply 1 Application topically as needed. (Patient taking differently: Apply 1 Application topically as needed (access port).) 30 g 0   lip balm (CARMEX) ointment Apply 1 Application topically as needed for lip care (cold sores). 7 g 0   lisinopril (ZESTRIL) 5 MG tablet Take 5 mg by mouth daily.     loperamide (IMODIUM) 2 MG capsule Take 1 capsule (2 mg total) by mouth as needed for diarrhea or loose stools. 30 capsule 0   loratadine (CLARITIN) 10 MG tablet Take 10 mg by mouth daily.     miconazole nitrate (MICATIN) POWD Apply 1 Application topically 2 (two) times daily.  0   Mouthwashes (MOUTH RINSE) LIQD solution 15 mLs by Mouth Rinse route as needed (for oral care).  0   mupirocin ointment (BACTROBAN) 2 % Apply 1 Application topically 2 (two) times daily.     Nystatin (GERHARDT'S BUTT CREAM) CREA Apply 1 Application topically 4 (four) times daily.     nystatin (MYCOSTATIN) 100000 UNIT/ML suspension Take 5 mLs (500,000 Units total) by mouth 4 (four) times daily. 60 mL 0   Pollen  Extracts (PROSTAT PO) Take 30 mLs by mouth in the morning and at bedtime.     Polyethyl Glycol-Propyl Glycol (SYSTANE ULTRA) 0.4-0.3 % SOLN Place 2 drops into both eyes in the morning and at bedtime.     polyethylene glycol (MIRALAX / GLYCOLAX) 17 g packet Take 17 g by mouth daily as needed for mild constipation.     prochlorperazine (COMPAZINE) 10 MG tablet Take 1 tablet (10 mg total) by mouth every 6 (six) hours as needed for nausea or vomiting. 30 tablet 0   Simethicone (GAS-X ULTRA STRENGTH) 180 MG CAPS Take 1 capsule by mouth every 8 (eight) hours as needed (gas).     Skin Protectants, Misc. (EUCERIN) cream Apply 1 Application topically 2 (two) times daily. Where skin is peeling     traMADol (ULTRAM) 50 MG tablet Take 1 tablet (50 mg total) by mouth every 6 (six) hours as needed for moderate pain. 30 tablet 0   zinc oxide 20 % ointment Apply 1 Application topically See admin instructions. Every shift and as needed (bilateral buttock)     No current facility-administered medications for this visit.    REVIEW OF SYSTEMS:   Constitutional: ( - ) fevers, ( - )  chills , ( - ) night sweats Eyes: ( - ) blurriness of vision, ( - ) double vision, ( - ) watery eyes Ears, nose, mouth, throat, and face: ( - ) mucositis, ( - ) sore throat Respiratory: ( - ) cough, ( - ) dyspnea, ( - ) wheezes Cardiovascular: ( - ) palpitation, ( - ) chest discomfort, ( - ) lower extremity swelling Gastrointestinal:  ( +) nausea, ( - ) heartburn, ( - ) change in bowel habits Skin: ( - ) abnormal skin rashes Lymphatics: ( - ) new lymphadenopathy, ( - ) easy bruising Neurological: ( - ) numbness, ( - ) tingling, ( - ) new weaknesses Behavioral/Psych: ( - ) mood change, ( - ) new changes  All other systems were reviewed with the  patient and are negative.  PHYSICAL EXAMINATION: ECOG PERFORMANCE STATUS: 2 - Symptomatic, <50% confined to bed  There were no vitals filed for this visit.   There were no vitals filed  for this visit.    GENERAL: Well-appearing elderly female, alert, no distress and comfortable SKIN: skin color, texture, turgor are normal, no rashes or significant lesions EYES: conjunctiva are pink and non-injected, sclera clear LUNGS: clear to auscultation and percussion with normal breathing effort HEART: regular rate & rhythm and no murmurs and no lower extremity edema Musculoskeletal: no cyanosis of digits and no clubbing  PSYCH: alert & oriented x 3, fluent speech NEURO: no focal motor/sensory deficits  LABORATORY DATA:  I have reviewed the data as listed    Latest Ref Rng & Units 03/14/2022   10:08 AM 03/03/2022    9:39 AM 03/02/2022    4:50 AM  CBC  WBC 4.0 - 10.5 K/uL 10.8  10.6  5.3   Hemoglobin 12.0 - 15.0 g/dL 10.1  8.7  9.6   Hematocrit 36.0 - 46.0 % 32.1  26.9  29.8   Platelets 150 - 400 K/uL 209  136  107        Latest Ref Rng & Units 03/14/2022   10:08 AM 03/03/2022    9:39 AM 03/02/2022    4:50 AM  CMP  Glucose 70 - 99 mg/dL 169  160  203   BUN 8 - 23 mg/dL '17  14  13   '$ Creatinine 0.44 - 1.00 mg/dL 0.77  0.76  0.73   Sodium 135 - 145 mmol/L 138  135  137   Potassium 3.5 - 5.1 mmol/L 4.2  3.7  3.4   Chloride 98 - 111 mmol/L 104  99  102   CO2 22 - 32 mmol/L '25  23  23   '$ Calcium 8.9 - 10.3 mg/dL 9.2  7.8  7.9   Total Protein 6.5 - 8.1 g/dL 6.4  5.0  5.4   Total Bilirubin 0.3 - 1.2 mg/dL 0.6  1.4  0.9   Alkaline Phos 38 - 126 U/L 295  298  355   AST 15 - 41 U/L 20  33  24   ALT 0 - 44 U/L 23  31  44     RADIOGRAPHIC STUDIES: [place] EKG SITE RITE  Result Date: 03/03/2022 If Site Rite image not attached, placement could not be confirmed due to current cardiac rhythm.  DG Chest Port 1 View  Result Date: 03/03/2022 CLINICAL DATA:  Provided history: Questionable sepsis-evaluate for abnormality. EXAM: PORTABLE CHEST 1 VIEW COMPARISON:  Prior chest radiographs 02/23/2022 and earlier. FINDINGS: The cardiomediastinal silhouette is unchanged. Aortic atherosclerosis.  Chronic elevation of the right hemidiaphragm. Focus of linear atelectasis within the mid right lung. No appreciable airspace consolidation or pulmonary edema. No evidence of pleural effusion or pneumothorax. No acute bony abnormality identified. IMPRESSION: 1. No evidence of airspace consolidation. 2. Focus of linear atelectasis within the mid right lung. 3. Chronic elevation of the right hemidiaphragm. 4. Aortic Atherosclerosis (ICD10-I70.0). Electronically Signed   By: Kellie Simmering D.O.   On: 03/03/2022 10:26   IR REMOVAL TUN ACCESS W/ PORT W/O FL MOD SED  Result Date: 02/28/2022 INDICATION: Patient has a history of Hodgkin's lymphoma admitted for neutropenic fevers. Team is requesting Port-A-Cath removal due to concern for infection. EXAM: REMOVAL OF IMPLANTED TUNNELED PORT-A-CATH MEDICATIONS: LIDOCAINE 1% 5 ML ANESTHESIA/SEDATION: Local anesthetic was administered. FLUOROSCOPY: None PROCEDURE: Informed written consent was obtained from the patient  after thorough discussion of the procedure risks, benefits and alternatives. All questions were addressed. Maximum sterile barrier technique was utilized including caps, masks, sterile gowns, sterile gloves, sterile drape, hand hygiene and skin antiseptic. A time-out was performed prior to the initiation of the procedure The RIGHT chest was prepped and draped in a sterile fashion. Lidocaine was utilized for local anesthetic. Port-A-Cath was palpated and an incision was made just superior. Utilizing blunt dissection the Port-A-Cath and reservoir were removed with the underlying subcutaneous tissue and their entirety. The pocket was then cleaned out with sterile normal saline The pocket was closed with 3 interrupted 4.0 Vicryl sutures and a 4.0 Vicryl running subcuticular stitch was utilized to approximate the skin. Dermabond was applied. Dressing was applied. The patient tolerated the procedure well without immediate post procedural complication. FINDINGS:  Successful removal of implant Port-A-Cath without immediate post procedural complication. IMPRESSION: Successful removal of a RIGHT chest implanted Port-A-Cath. A tip culture was submitted per Infectious Disease request. Read by: Rushie Nyhan, NP Electronically Signed   By: Michaelle Birks M.D.   On: 02/28/2022 16:49   CT Head Wo Contrast  Result Date: 02/24/2022 CLINICAL DATA:  Mental status change, unknown cause EXAM: CT HEAD WITHOUT CONTRAST TECHNIQUE: Contiguous axial images were obtained from the base of the skull through the vertex without intravenous contrast. RADIATION DOSE REDUCTION: This exam was performed according to the departmental dose-optimization program which includes automated exposure control, adjustment of the mA and/or kV according to patient size and/or use of iterative reconstruction technique. COMPARISON:  CT head June 14, 21. FINDINGS: Brain: No evidence of acute infarction, hemorrhage, hydrocephalus, extra-axial collection or mass lesion/mass effect. Vascular: No hyperdense vessel. Skull: No acute fracture. Sinuses/Orbits: Clear sinuses.  No acute orbital findings. Other: No mastoid effusions. IMPRESSION: No evidence of acute intracranial abnormality. Electronically Signed   By: Margaretha Sheffield M.D.   On: 02/24/2022 14:54   CT ABDOMEN PELVIS W CONTRAST  Result Date: 02/23/2022 CLINICAL DATA:  Hodgkin's lymphoma. Sacral wound, fever, hypoxia and lethargy. * Tracking Code: BO * EXAM: CT ABDOMEN AND PELVIS WITH CONTRAST TECHNIQUE: Multidetector CT imaging of the abdomen and pelvis was performed using the standard protocol following bolus administration of intravenous contrast. RADIATION DOSE REDUCTION: This exam was performed according to the departmental dose-optimization program which includes automated exposure control, adjustment of the mA and/or kV according to patient size and/or use of iterative reconstruction technique. CONTRAST:  138m OMNIPAQUE IOHEXOL 300 MG/ML  SOLN  COMPARISON:  02/10/2022 CT scan FINDINGS: Lower chest: Mild linear subsegmental atelectasis in the right lower lobe. Stable focal linear scarring in the lingula. Descending thoracic aortic atherosclerosis. Hepatobiliary: Borderline distention of the gallbladder. Small gallstones are present dependently in the gallbladder. Pancreas: Unremarkable Spleen: There is a anterolateral band of hypodensity extending in the spleen on images 20-25 of series 2 which is new compared to prior exams. Possibilities include heterogeneity related to early contrast phase, a small splenic infarct, a lymphoma related lesion with atypical morphology, or less likely a small splenic laceration. No perisplenic ascites to further suggest recent splenic trauma. Adrenals/Urinary Tract: The urinary bladder extends 1.4 cm below the pubococcygeal line, compatible with pelvic floor laxity and cystocele. The kidneys and adrenal glands appear unremarkable. Stomach/Bowel: Sigmoid colon diverticulosis. Otherwise unremarkable. Vascular/Lymphatic: Atherosclerosis is present, including aortoiliac atherosclerotic disease. Atheromatous plaque at the origins of the celiac trunk and SMA may be causing some degree of stenosis, but both vessels opacify and accordingly no overt occlusion is identified. There is  atheromatous plaque proximally in both renal arteries. Retroperitoneal adenopathy is present, index left periaortic lymph node 1.5 cm in short axis on image 41 series 2, formerly the same. Overall no substantive change in the visualized adenopathy. Reproductive: No significant findings Other: No supplemental non-categorized findings. Musculoskeletal: Dextroconvex lumbar scoliosis with rotary component. Sclerotic Schmorl's nodes and degenerative endplate findings at several lumbar levels. Right eccentric vertical narrowing of the L5 vertebral body as before. Multilevel lumbar impingement due to spondylosis and degenerative disc disease. Focal band of  subcutaneous edema extends from the left buttock towards the left posterior iliac bone on image 60 of series 2, with associated overlying cutaneous thickening. This has increased substantially since 02/10/2022 and likely rep reflects the reported decubitus ulcer. Although this extends faintly towards the periosteal margin, we do not demonstrate a deep gas-filled cavity, and there is no bony demineralization or evidence of underlying bony involvement to suggest osteomyelitis. Along the top of the gluteal cleft, there is also a suspected ulceration or lesion extending down towards the sacrum on images 66 through 72 of series 2, increased from prior. Although this abuts the posterior sacral margin there are no findings of bony demineralization or osteomyelitis. Small radiodensity along the associated wound may represent bandaging. No drainable abscess observed. IMPRESSION: 1. Decubitus ulcers along the left buttock extending towards the left posterior iliac bone, and along the upper margin of the gluteal cleft. Both have edema extending towards the adjacent bony structures but no findings of osteomyelitis or separate drainable abscess. 2. New band of hypodensity extending in the spleen, differential diagnostic considerations include small splenic infarct, heterogeneous enhancement related to early contrast phase, lymphoma related lesion with atypical morphology, or less likely a small splenic laceration. No perisplenic ascites to further suggest recent splenic trauma. 3. Stable retroperitoneal adenopathy. 4. Pelvic floor laxity and cystocele. 5. Sigmoid colon diverticulosis. 6. Cholelithiasis. 7. Atheromatous plaque at the origins of the celiac trunk and SMA may be causing some degree of stenosis, but both vessels opacify and accordingly no overt occlusion is identified. 8. Dextroconvex lumbar scoliosis with rotary component. Multilevel lumbar impingement due to spondylosis and degenerative disc disease. 9. Aortic  atherosclerosis. Aortic Atherosclerosis (ICD10-I70.0). Electronically Signed   By: Van Clines M.D.   On: 02/23/2022 15:03   DG Chest Port 1 View  Result Date: 02/23/2022 CLINICAL DATA:  Questionable sepsis - evaluate for abnormality EXAM: PORTABLE CHEST 1 VIEW COMPARISON:  February 10, 2022 FINDINGS: The cardiomediastinal silhouette is unchanged in contour.RIGHT chest port with tip terminating over the superior cavoatrial junction. Atherosclerotic calcifications. No pleural effusion. No pneumothorax. No acute pleuroparenchymal abnormality. IMPRESSION: No acute cardiopulmonary abnormality. Electronically Signed   By: Valentino Saxon M.D.   On: 02/23/2022 13:59    ASSESSMENT & PLAN Maleeah Crossman 72 y.o. female with medical history significant for newly diagnosed Hodgkin's lymphoma who presents for a follow up visit.   #Hodgkin's Lymphoma, Stage III --biopsy of abdominal lymph node on 12/27/2021 showed classical Hodgkin's lymphoma.  --Pre-treatment PET scan from [facility] [facility] abdominal adenopathy along with mild hypermetabolism involving mediastinal and bilateral axillary nodes. --Recommend AVD chemotherapy x 2 cycles followed by PET CT scan.  PLAN: --today is Cycle 2, Day 1 of AVD chemotherapy --Labs from today show white blood cell 10.1, hemoglobin 8.7, MCV 88.2, and platelets of 87 --Discussed thrombocytopenia with Dr. Lorenso Courier who recommends to continue with treatment today without any dose modifications. We will have patient return in 1 week for  a lab check.  --RTC in  two weeks with labs fore Cycle 2 Day 15 of AVD chemotherapy.   #Neutropenic fever: --Patient was admitted from 02/10/2022-02/13/2022 for neutropenic fever --Patient will receive Udenyca injection on Day 3 after each treatment starting this cycle.   #Sigmoid colonic focal hypermetabolism #Hepatic steatosis with possible cirrhosis --Requested GI to further evaluate.  #Supportive Care -- chemotherapy education  complete -- port placement complete --baseline echo from 01/06/2022 showed EF 60-65% -- not planning for bleomycin, no need for PFTs.  --  compazine '10mg'$  PO q6H for nausea (zofran contraindicated due to QT prolongated medications).  -- allopurinol '300mg'$  PO daily for TLS prophylaxis -- EMLA cream for port -- no pain medication required at this time.    No orders of the defined types were placed in this encounter.   All questions were answered. The patient knows to call the clinic with any problems, questions or concerns.  I have spent a total of 30 minutes minutes of face-to-face and non-face-to-face time, preparing to see the patient, performing a medically appropriate examination, counseling and educating the patient, ordering tests/procedures, communicating with other health care professionals, documenting clinical information in the electronic health record,  and care coordination.   Dede Query PA-C Dept of Hematology and [place] at [facility] Phone: [phone]    03/16/2022 2:01 PM

## 2022-03-22 ENCOUNTER — Ambulatory Visit (INDEPENDENT_AMBULATORY_CARE_PROVIDER_SITE_OTHER): Payer: Medicare Other | Admitting: Gastroenterology

## 2022-03-22 ENCOUNTER — Encounter: Payer: Self-pay | Admitting: Gastroenterology

## 2022-03-22 VITALS — BP 102/64

## 2022-03-22 DIAGNOSIS — R194 Change in bowel habit: Secondary | ICD-10-CM | POA: Diagnosis not present

## 2022-03-22 DIAGNOSIS — R110 Nausea: Secondary | ICD-10-CM

## 2022-03-22 DIAGNOSIS — K760 Fatty (change of) liver, not elsewhere classified: Secondary | ICD-10-CM | POA: Diagnosis not present

## 2022-03-22 NOTE — Patient Instructions (Addendum)
It was a pleasure to see you today.  We discussed an upper endoscopy and colonoscopy to evaluate your recent symptoms.   I would like to see you back in the office in  6 months. Please let me know if you need anything prior to that time.   Thank you for entrusting me with your care and choosing [facility].  Dr Tarri Glenn

## 2022-03-22 NOTE — Progress Notes (Addendum)
Referring Provider: No ref. provider found Primary Care Physician:  Pcp, No  Chief complaint:  Abdominal pain and diarrhea   IMPRESSION:  Recent change in bowel habits Abdominal bloating and nausea preceding recent Hodgkin's Lymphoma diagnosis History of unexplained anemia without overt or occult GI bleeding    - no iron deficiency by labs    - FOBT negative in June Sigmoid abnormality on PET scan - likely diverticulosis, no mass seen on subsequent imaging No prior colon cancer screening Fatty liver with elevated alk phos Suspected fatty pancreas  Recent GI symptoms. Etiology unclear. May be related Hodgkin's lymphoma bulk, chemotherapy, or concurrent GI disease.  Fatty liver. Prior imaging findings suggestive of portal hypertension but these findings are not seen on more recent imaging. Abnormalities on prior imaging may be related to Hodgkin's Lymphoma given the prior elastography and FibroSURE results and not pulmonary hypertension. Continue close monitoring for evidence of disease progress.    Anemia without overt bleeding noted during hospitalization 2021. Some cytopenias now on chemotherapy. She is followed closely by Dr. Lorenso Courier.  EGD and colonoscopy were recommended in 2022 but have not yet been scheduled.    PLAN: - EGD and colonoscopy at the hospital after she completes chemotherapy - Restage fatty liver later this year given concurrent diabetes - Office follow-up in 6 months, earlier if needed   HPI: Yahayra Saintvil is a 72 y.o. female who was last seen in 2022.  Returns today in follow-up. She has diabetes mellitus type 2, hypertension, gout, asthma, schizophrenia, sacral ulcers followed at the wound care center, thigh ulcer, and a new diagnosis of Hodgkin's lymphoma.   Diagnosed with stage III Hodgkin's lymphoma with identification of retroperitoneal adenopathy on CT 12/09/21 confirmed with abdominal mass biopsy later that month. PET CT scan showed hypermetabolic  abdominal adenopathy consistent with lymphoma. The PET showed sigmoid colonic hypermetabolism that the radiologist felt corresponded to extensive diverticulosis and mild hypermetabolism involving the mediastinal and bilateral axillary nodes. She has been received AVD chemotherapy since 01/17/22. She was hospitalized with neutropenic fever 1/22/4.    Initially seen for abnormal liver enzymes during her hospitalization 08/08/19 for community acquired pneumonia. Alk phos, transaminases, and bilirubin were noted to be intermittently elevated over the year prior to hospitalization and were trending down during her hospitalization without intervention. Serologic evaluation for common causes of abnormal liver enzymes was largely normal including testing for autoimmune hepatitis, chronic viral hepatitis, celiac, and alpha-one antitrypsin. She had a positive HAV IgM antibody in 2019. 5" nucelotidase was elevated at 40. GGTP elevated at 190.   CT abd/pelvis with contrast 6/20, CT abd/pelvis without contrast 6/21 and ultrasound 6/21 and 6/29 showed hepatic steatosis.  MRI/MRCP 08/08/19 showed fatty liver, trace ascites, mild splenomegaly, and bilateral pleural effusions.   Abdominal ultrasound with elastography 11/06/19: echogenic liver, fatty appearing pancreas, median kPa 4.1 (high probability of being normal)  Labs 10/08/20 NASH FibroSURE: no fibrosis, marked or severe steatosis, N2 NASH  She had concurrent anemia requiring PRBCs while hospitalized in 2021. There was no overt or occult bleeding. FOBT negative 08/05/19.  However, she has not had any colon cancer screening. Recommended EGD and colonoscopy as an outpatient. She was fearful to proceed with the procedure and this has not yet been performed.  Labs 03/14/22 showed hemoglobin 10.1, MCV 95, RDW 18, platelets 209  Returns today in follow-up.  She has had intermittent loose stools but is having more difficulty with constipation. She is having one small bowel  movement daily with  a sense of incomplete evacuation. No abdominal pain. No significant nausea since .   Past Medical History:  Diagnosis Date   AKI (acute kidney injury) ([place]) 12/17/2017   Allergic rhinitis    Anemia    Arthritis    Asthma    Autonomic neuropathy    Depression    Diabetes mellitus    Diabetes mellitus without complication (HCC)    E coli infection    GERD (gastroesophageal reflux disease)    GERD (gastroesophageal reflux disease)    Gout    Hepatitis A    High cholesterol    Hypertension    Insomnia    Kidney failure    Metabolic encephalopathy    Morbid obesity (HCC)    NASH (nonalcoholic steatohepatitis)    Pneumonia 12/17/2017   Schizophrenia ([place])    Stroke ([place])    Vitamin B deficiency    Vitamin D deficiency     Past Surgical History:  Procedure Laterality Date   CYST EXCISION     buttucks   DENTAL SURGERY     IR IMAGING GUIDED PORT INSERTION  01/10/2022   IR REMOVAL TUN ACCESS W/ PORT W/O FL MOD SED  02/28/2022   TONSILLECTOMY      Current Outpatient Medications  Medication Sig Dispense Refill   acetaminophen (TYLENOL) 500 MG tablet Take 1,000 mg by mouth 3 (three) times daily as needed for moderate pain.     albuterol (VENTOLIN HFA) 108 (90 Base) MCG/ACT inhaler Inhale 2 puffs into the lungs every 4 (four) hours as needed for wheezing or shortness of breath.     allopurinol (ZYLOPRIM) 100 MG tablet Take 100 mg by mouth daily.     amLODipine (NORVASC) 10 MG tablet Take 10 mg by mouth daily.     calamine lotion Apply 1 Application topically 2 (two) times daily.  0   carvedilol (COREG) 6.25 MG tablet Take 6.25 mg by mouth 2 (two) times daily with a meal.     cholecalciferol (VITAMIN D3) 25 MCG (1000 UNIT) tablet Take 2,000 Units by mouth daily.     doxepin (SINEQUAN) 25 MG capsule Take 25 mg by mouth at bedtime.     famotidine (PEPCID) 20 MG tablet Take 20 mg by mouth 2 (two) times daily.     fluticasone (FLONASE) 50 MCG/ACT nasal spray Place  1 spray into both nostrils daily.     glipiZIDE (GLUCOTROL XL) 10 MG 24 hr tablet Take 10 mg by mouth in the morning and at bedtime.     guaiFENesin (MUCINEX) 600 MG 12 hr tablet Take 600 mg by mouth every 12 (twelve) hours.     hydrOXYzine (ATARAX/VISTARIL) 25 MG tablet Take 25 mg by mouth in the morning and at bedtime.     insulin aspart (NOVOLOG) 100 UNIT/ML injection Inject 0-12 Units into the skin See admin instructions. Inject 0-12 units into the skin 4 times a day (before meals and at bedtime) per sliding scale: BGL <60 or >400  = CALL MD; 60-200 = 0 units; 201-250 = 2 units; 251-300 = 4 units; 301-350 = 6 units; 351-400 = 8 units; 401-450 = 10 units; >450 = 12 units     lactose free nutrition (BOOST) LIQD Take 237 mLs by mouth 2 (two) times daily between meals.     LANTUS SOLOSTAR 100 UNIT/ML Solostar Pen Inject 28-38 Units into the skin 2 (two) times daily. Inject 38 units in the morning and Inject 28 units at bedtime  lidocaine 4 % Place 1 patch onto the skin daily.     lidocaine-prilocaine (EMLA) cream Apply 1 Application topically as needed. 30 g 0   lip balm (CARMEX) ointment Apply 1 Application topically as needed for lip care (cold sores). 7 g 0   lisinopril (ZESTRIL) 5 MG tablet Take 5 mg by mouth daily.     loperamide (IMODIUM) 2 MG capsule Take 1 capsule (2 mg total) by mouth as needed for diarrhea or loose stools. 30 capsule 0   loratadine (CLARITIN) 10 MG tablet Take 10 mg by mouth daily.     miconazole nitrate (MICATIN) POWD Apply 1 Application topically 2 (two) times daily.  0   Mouthwashes (MOUTH RINSE) LIQD solution 15 mLs by Mouth Rinse route as needed (for oral care).  0   mupirocin ointment (BACTROBAN) 2 % Apply 1 Application topically 2 (two) times daily.     Nystatin (GERHARDT'S BUTT CREAM) CREA Apply 1 Application topically 4 (four) times daily.     nystatin (MYCOSTATIN) 100000 UNIT/ML suspension Take 5 mLs (500,000 Units total) by mouth 4 (four) times daily. 60 mL 0    Pollen Extracts (PROSTAT PO) Take 30 mLs by mouth in the morning and at bedtime.     Polyethyl Glycol-Propyl Glycol (SYSTANE ULTRA) 0.4-0.3 % SOLN Place 2 drops into both eyes in the morning and at bedtime.     prochlorperazine (COMPAZINE) 10 MG tablet Take 1 tablet (10 mg total) by mouth every 6 (six) hours as needed for nausea or vomiting. 30 tablet 0   Simethicone (GAS-X ULTRA STRENGTH) 180 MG CAPS Take 1 capsule by mouth every 8 (eight) hours as needed (gas).     Skin Protectants, Misc. (EUCERIN) cream Apply 1 Application topically 2 (two) times daily. Where skin is peeling     traMADol (ULTRAM) 50 MG tablet Take 1 tablet (50 mg total) by mouth every 6 (six) hours as needed for moderate pain. 30 tablet 0   zinc oxide 20 % ointment Apply 1 Application topically See admin instructions. Every shift and as needed (bilateral buttock)     cefTRIAXone (ROCEPHIN) IVPB Inject 2 g into the vein daily. Indication:  port infection First Dose: No Last Day of Therapy:  03/14/2022 Labs - Once weekly:  CBC/D and BMP, Labs - Every other week:  ESR and CRP Method of administration: IV Push Method of administration may be changed at the discretion of home infusion pharmacist based upon assessment of the patient and/or caregiver's ability to self-administer the medication ordered. (Patient not taking: Reported on 03/16/2022) 12 Units 0   No current facility-administered medications for this visit.    Allergies as of 03/22/2022 - Review Complete 03/22/2022  Allergen Reaction Noted   Emend [aprepitant] Other (See Comments) 01/17/2022   Vancomycin Other (See Comments) 09/05/2019   Fosaprepitant Other (See Comments) 01/26/2022   Lasix [furosemide] Other (See Comments) 05/07/2018   Lasix [furosemide] Other (See Comments) 09/03/2019    Family History  Adopted: Yes  Problem Relation Age of Onset   COPD Daughter      Physical Exam: General:   Alert,  well-nourished, pleasant and cooperative in NAD.  Examined in a wheelchair.  Head:  Normocephalic and atraumatic. Eyes:  Sclera clear, no icterus.   Conjunctiva pink. Abdomen:  Soft, central obesity, nondistended, normal bowel sounds, no rebound or guarding. No hepatosplenomegaly.   Neurologic:  Alert and  oriented x4;  grossly nonfocal Skin:  Intact without significant lesions or rashes. Psych:  Alert and  cooperative. Normal mood and affect.     Annalyce Lanpher L. Tarri Glenn, MD, MPH 03/28/2022, 3:23 PM

## 2022-03-27 ENCOUNTER — Other Ambulatory Visit: Payer: Self-pay | Admitting: Hematology and Oncology

## 2022-03-27 MED FILL — Dexamethasone Sodium Phosphate Inj 100 MG/10ML: INTRAMUSCULAR | Qty: 1 | Status: AC

## 2022-03-27 NOTE — Progress Notes (Incomplete)
[place] [phone]   [phone]  PROGRESS NOTE  Patient Care Team: Pcp, No as PCP - General [name], MD (Internal Medicine)  Hematological/Oncological History # Hodkgin's Lymphoma, Stage III 12/09/2021: CT Abdomen/Pelvis showed retroperitoneal adenopathy, with 2.1 x 3.6 cm node 12/20/2021: establish care with [facility]  12/27/2021: CT abdominal mass biopsy performed, findings consistent with classical Hodgkin's lymphoma. 01/05/2022: PET CT scan showed hypermetabolic abdominal adenopathy consistent with lymphoma.  She has mild hypermetabolism involving the mediastinal and bilateral axillary nodes. 01/17/2022: Cycle 1, Day 1 of AVD chemotherapy 01/31/2022: Cycle 1, Day 15 of AVD chemotherapy  Interval History:  Heather Petty 72 y.o. female with medical history significant for newly diagnosed Hodgkin's lymphoma who presents for a follow up visit. The patient's last visit was on 02/27/2022. In the interim she was admitted to the hospital with neutropenic fever and was found to have a port infection.   On exam today Heather Petty reports she is having a lot of dryness and peeling on her hands and feet.  She reports her hands are improved and her feet are much better after using a lotion.  She reports that she feels like she is getting a little better since her time in the hospital.  She notes her energy level still "are not great".  She reports that she is feeling a little bit stronger.  She is eating well and eating plenty of food.  She notes that she is not having any nausea or vomiting but does have a little bit of loose stools on occasion.  She is not having any overt signs of bleeding.  She denies any lymphadenopathy and is not having any fevers, chills, sweats.  A full 10 point ROS is otherwise negative.  Due to her port removal chemotherapy will need to be held today.  We will have the port placed back in and I discussed this with  infectious disease.  Will plan to see her back in 2 weeks time to restart her chemotherapy regimen.  MEDICAL HISTORY:  Past Medical History:  Diagnosis Date   AKI (acute kidney injury) ([place]) 12/17/2017   Allergic rhinitis    Anemia    Arthritis    Asthma    Autonomic neuropathy    Depression    Diabetes mellitus    Diabetes mellitus without complication (HCC)    E coli infection    GERD (gastroesophageal reflux disease)    GERD (gastroesophageal reflux disease)    Gout    Hepatitis A    High cholesterol    Hypertension    Insomnia    Kidney failure    Metabolic encephalopathy    Morbid obesity (HCC)    NASH (nonalcoholic steatohepatitis)    Pneumonia 12/17/2017   Schizophrenia ([place])    Stroke ([place])    Vitamin B deficiency    Vitamin D deficiency     SURGICAL HISTORY: Past Surgical History:  Procedure Laterality Date   CYST EXCISION     buttucks   DENTAL SURGERY     IR IMAGING GUIDED PORT INSERTION  01/10/2022   IR REMOVAL TUN ACCESS W/ PORT W/O FL MOD SED  02/28/2022   TONSILLECTOMY      SOCIAL HISTORY: Social History   Socioeconomic History   Marital status: Unknown    Spouse name: Not on file   Number of children: 2   Years of education: Not on file   Highest education level: Not on file  Occupational History  Occupation: retired/disabled  Tobacco Use   Smoking status: Never   Smokeless tobacco: Never  Vaping Use   Vaping Use: Never used  Substance and Sexual Activity   Alcohol use: Never   Drug use: Never   Sexual activity: Not Currently    Birth control/protection: None  Other Topics Concern   Not on file  Social History Narrative   ** Merged History Encounter **       Social Determinants of Health   Financial Resource Strain: Not on file  Food Insecurity: No Food Insecurity (02/26/2022)   Hunger Vital Sign    Worried About Running Out of Food in the Last Year: Never true    Ran Out of Food in the Last Year: Never true  Transportation  Needs: No Transportation Needs (02/26/2022)   PRAPARE - Hydrologist (Medical): No    Lack of Transportation (Non-Medical): No  Physical Activity: Not on file  Stress: Not on file  Social Connections: Not on file  Intimate Partner Violence: Not At Risk (02/26/2022)   Humiliation, Afraid, Rape, and Kick questionnaire    Fear of Current or Ex-Partner: No    Emotionally Abused: No    Physically Abused: No    Sexually Abused: No    FAMILY HISTORY: Family History  Adopted: Yes  Problem Relation Age of Onset   COPD Daughter     ALLERGIES:  is allergic to [place] [aprepitant], vancomycin, fosaprepitant, lasix [furosemide], and lasix [furosemide].  MEDICATIONS:  Current Outpatient Medications  Medication Sig Dispense Refill   acetaminophen (TYLENOL) 500 MG tablet Take 1,000 mg by mouth 3 (three) times daily as needed for moderate pain.     albuterol (VENTOLIN HFA) 108 (90 Base) MCG/ACT inhaler Inhale 2 puffs into the lungs every 4 (four) hours as needed for wheezing or shortness of breath.     allopurinol (ZYLOPRIM) 100 MG tablet Take 100 mg by mouth daily.     amLODipine (NORVASC) 10 MG tablet Take 10 mg by mouth daily.     calamine lotion Apply 1 Application topically 2 (two) times daily.  0   carvedilol (COREG) 6.25 MG tablet Take 6.25 mg by mouth 2 (two) times daily with a meal.     cefTRIAXone (ROCEPHIN) IVPB Inject 2 g into the vein daily. Indication:  port infection First Dose: No Last Day of Therapy:  03/14/2022 Labs - Once weekly:  CBC/D and BMP, Labs - Every other week:  ESR and CRP Method of administration: IV Push Method of administration may be changed at the discretion of home infusion pharmacist based upon assessment of the patient and/or caregiver's ability to self-administer the medication ordered. (Patient not taking: Reported on 03/16/2022) 12 Units 0   cholecalciferol (VITAMIN D3) 25 MCG (1000 UNIT) tablet Take 2,000 Units by mouth daily.      doxepin (SINEQUAN) 25 MG capsule Take 25 mg by mouth at bedtime.     famotidine (PEPCID) 20 MG tablet Take 20 mg by mouth 2 (two) times daily.     fluticasone (FLONASE) 50 MCG/ACT nasal spray Place 1 spray into both nostrils daily.     glipiZIDE (GLUCOTROL XL) 10 MG 24 hr tablet Take 10 mg by mouth in the morning and at bedtime.     guaiFENesin (MUCINEX) 600 MG 12 hr tablet Take 600 mg by mouth every 12 (twelve) hours.     hydrOXYzine (ATARAX/VISTARIL) 25 MG tablet Take 25 mg by mouth in the morning and at bedtime.  insulin aspart (NOVOLOG) 100 UNIT/ML injection Inject 0-12 Units into the skin See admin instructions. Inject 0-12 units into the skin 4 times a day (before meals and at bedtime) per sliding scale: BGL <60 or >400  = CALL MD; 60-200 = 0 units; 201-250 = 2 units; 251-300 = 4 units; 301-350 = 6 units; 351-400 = 8 units; 401-450 = 10 units; >450 = 12 units     lactose free nutrition (BOOST) LIQD Take 237 mLs by mouth 2 (two) times daily between meals.     LANTUS SOLOSTAR 100 UNIT/ML Solostar Pen Inject 28-38 Units into the skin 2 (two) times daily. Inject 38 units in the morning and Inject 28 units at bedtime     lidocaine 4 % Place 1 patch onto the skin daily.     lidocaine-prilocaine (EMLA) cream Apply 1 Application topically as needed. 30 g 0   lip balm (CARMEX) ointment Apply 1 Application topically as needed for lip care (cold sores). 7 g 0   lisinopril (ZESTRIL) 5 MG tablet Take 5 mg by mouth daily.     loperamide (IMODIUM) 2 MG capsule Take 1 capsule (2 mg total) by mouth as needed for diarrhea or loose stools. 30 capsule 0   loratadine (CLARITIN) 10 MG tablet Take 10 mg by mouth daily.     miconazole nitrate (MICATIN) POWD Apply 1 Application topically 2 (two) times daily.  0   Mouthwashes (MOUTH RINSE) LIQD solution 15 mLs by Mouth Rinse route as needed (for oral care).  0   mupirocin ointment (BACTROBAN) 2 % Apply 1 Application topically 2 (two) times daily.     Nystatin  (GERHARDT'S BUTT CREAM) CREA Apply 1 Application topically 4 (four) times daily.     nystatin (MYCOSTATIN) 100000 UNIT/ML suspension Take 5 mLs (500,000 Units total) by mouth 4 (four) times daily. 60 mL 0   Pollen Extracts (PROSTAT PO) Take 30 mLs by mouth in the morning and at bedtime.     Polyethyl Glycol-Propyl Glycol (SYSTANE ULTRA) 0.4-0.3 % SOLN Place 2 drops into both eyes in the morning and at bedtime.     prochlorperazine (COMPAZINE) 10 MG tablet Take 1 tablet (10 mg total) by mouth every 6 (six) hours as needed for nausea or vomiting. 30 tablet 0   Simethicone (GAS-X ULTRA STRENGTH) 180 MG CAPS Take 1 capsule by mouth every 8 (eight) hours as needed (gas).     Skin Protectants, Misc. (EUCERIN) cream Apply 1 Application topically 2 (two) times daily. Where skin is peeling     traMADol (ULTRAM) 50 MG tablet Take 1 tablet (50 mg total) by mouth every 6 (six) hours as needed for moderate pain. 30 tablet 0   zinc oxide 20 % ointment Apply 1 Application topically See admin instructions. Every shift and as needed (bilateral buttock)     No current facility-administered medications for this visit.    REVIEW OF SYSTEMS:   Constitutional: ( - ) fevers, ( - )  chills , ( - ) night sweats Eyes: ( - ) blurriness of vision, ( - ) double vision, ( - ) watery eyes Ears, nose, mouth, throat, and face: ( - ) mucositis, ( - ) sore throat Respiratory: ( - ) cough, ( - ) dyspnea, ( - ) wheezes Cardiovascular: ( - ) palpitation, ( - ) chest discomfort, ( - ) lower extremity swelling Gastrointestinal:  ( +) nausea, ( - ) heartburn, ( - ) change in bowel habits Skin: ( - ) abnormal skin  rashes Lymphatics: ( - ) new lymphadenopathy, ( - ) easy bruising Neurological: ( - ) numbness, ( - ) tingling, ( - ) new weaknesses Behavioral/Psych: ( - ) mood change, ( - ) new changes  All other systems were reviewed with the patient and are negative.  PHYSICAL EXAMINATION: ECOG PERFORMANCE STATUS: 2 - Symptomatic,  <50% confined to bed  There were no vitals filed for this visit.   There were no vitals filed for this visit.    GENERAL: Well-appearing elderly female, alert, no distress and comfortable SKIN: skin color, texture, turgor are normal, no rashes or significant lesions EYES: conjunctiva are pink and non-injected, sclera clear LUNGS: clear to auscultation and percussion with normal breathing effort HEART: regular rate & rhythm and no murmurs and no lower extremity edema Musculoskeletal: no cyanosis of digits and no clubbing  PSYCH: alert & oriented x 3, fluent speech NEURO: no focal motor/sensory deficits  LABORATORY DATA:  I have reviewed the data as listed    Latest Ref Rng & Units 03/14/2022   10:08 AM 03/03/2022    9:39 AM 03/02/2022    4:50 AM  CBC  WBC 4.0 - 10.5 K/uL 10.8  10.6  5.3   Hemoglobin 12.0 - 15.0 g/dL 10.1  8.7  9.6   Hematocrit 36.0 - 46.0 % 32.1  26.9  29.8   Platelets 150 - 400 K/uL 209  136  107        Latest Ref Rng & Units 03/14/2022   10:08 AM 03/03/2022    9:39 AM 03/02/2022    4:50 AM  CMP  Glucose 70 - 99 mg/dL 169  160  203   BUN 8 - 23 mg/dL 17  14  13   $ Creatinine 0.44 - 1.00 mg/dL 0.77  0.76  0.73   Sodium 135 - 145 mmol/L 138  135  137   Potassium 3.5 - 5.1 mmol/L 4.2  3.7  3.4   Chloride 98 - 111 mmol/L 104  99  102   CO2 22 - 32 mmol/L 25  23  23   $ Calcium 8.9 - 10.3 mg/dL 9.2  7.8  7.9   Total Protein 6.5 - 8.1 g/dL 6.4  5.0  5.4   Total Bilirubin 0.3 - 1.2 mg/dL 0.6  1.4  0.9   Alkaline Phos 38 - 126 U/L 295  298  355   AST 15 - 41 U/L 20  33  24   ALT 0 - 44 U/L 23  31  44     RADIOGRAPHIC STUDIES: [place] EKG SITE RITE  Result Date: 03/03/2022 If Site Rite image not attached, placement could not be confirmed due to current cardiac rhythm.  DG Chest Port 1 View  Result Date: 03/03/2022 CLINICAL DATA:  Provided history: Questionable sepsis-evaluate for abnormality. EXAM: PORTABLE CHEST 1 VIEW COMPARISON:  Prior chest radiographs  02/23/2022 and earlier. FINDINGS: The cardiomediastinal silhouette is unchanged. Aortic atherosclerosis. Chronic elevation of the right hemidiaphragm. Focus of linear atelectasis within the mid right lung. No appreciable airspace consolidation or pulmonary edema. No evidence of pleural effusion or pneumothorax. No acute bony abnormality identified. IMPRESSION: 1. No evidence of airspace consolidation. 2. Focus of linear atelectasis within the mid right lung. 3. Chronic elevation of the right hemidiaphragm. 4. Aortic Atherosclerosis (ICD10-I70.0). Electronically Signed   By: Kellie Simmering D.O.   On: 03/03/2022 10:26   IR REMOVAL TUN ACCESS W/ PORT W/O FL MOD SED  Result Date: 02/28/2022 INDICATION: Patient has a  history of Hodgkin's lymphoma admitted for neutropenic fevers. Team is requesting Port-A-Cath removal due to concern for infection. EXAM: REMOVAL OF IMPLANTED TUNNELED PORT-A-CATH MEDICATIONS: LIDOCAINE 1% 5 ML ANESTHESIA/SEDATION: Local anesthetic was administered. FLUOROSCOPY: None PROCEDURE: Informed written consent was obtained from the patient after thorough discussion of the procedure risks, benefits and alternatives. All questions were addressed. Maximum sterile barrier technique was utilized including caps, masks, sterile gowns, sterile gloves, sterile drape, hand hygiene and skin antiseptic. A time-out was performed prior to the initiation of the procedure The RIGHT chest was prepped and draped in a sterile fashion. Lidocaine was utilized for local anesthetic. Port-A-Cath was palpated and an incision was made just superior. Utilizing blunt dissection the Port-A-Cath and reservoir were removed with the underlying subcutaneous tissue and their entirety. The pocket was then cleaned out with sterile normal saline The pocket was closed with 3 interrupted 4.0 Vicryl sutures and a 4.0 Vicryl running subcuticular stitch was utilized to approximate the skin. Dermabond was applied. Dressing was applied. The  patient tolerated the procedure well without immediate post procedural complication. FINDINGS: Successful removal of implant Port-A-Cath without immediate post procedural complication. IMPRESSION: Successful removal of a RIGHT chest implanted Port-A-Cath. A tip culture was submitted per Infectious Disease request. Read by: Rushie Nyhan, NP Electronically Signed   By: Michaelle Birks M.D.   On: 02/28/2022 16:49    ASSESSMENT & PLAN Heather Petty 72 y.o. female with medical history significant for newly diagnosed Hodgkin's lymphoma who presents for a follow up visit.   #Hodgkin's Lymphoma, Stage III --biopsy of abdominal lymph node on 12/27/2021 showed classical Hodgkin's lymphoma.  --Pre-treatment PET scan from [facility] abdominal adenopathy along with mild hypermetabolism involving mediastinal and bilateral axillary nodes. --Recommend AVD chemotherapy x 2 cycles followed by PET CT scan.  PLAN: --today patient is due for Cycle 2, Day 1 of AVD chemotherapy. HELD as she does not currently have access.  --Labs from today show white blood cell *** --AVD chemotherapy typically given without GCSF support but given her neutropenic fever she will require it moving forward.  --RTC after placement of port for Cycle 2 Day 1 of AVD chemotherapy.   #Sigmoid colonic focal hypermetabolism #Hepatic steatosis with possible cirrhosis --Will request follow up with GI to further evaluate.  #Supportive Care -- chemotherapy education complete -- port placement complete, however was removed while inpatient due to infection.  Have placed order to have this placed again.  --baseline echo from 01/06/2022 showed EF 60-65% -- not planning for bleomycin, no need for PFTs.  --  compazine 39m PO q6H for nausea (zofran contraindicated due to QT prolongated medications).  -- allopurinol 3029mPO daily for TLS prophylaxis -- EMLA cream for port -- no pain medication required at this time.    No orders of the  defined types were placed in this encounter.   All questions were answered. The patient knows to call the clinic with any problems, questions or concerns.  I have spent a total of 30 minutes minutes of face-to-face and non-face-to-face time, preparing to see the patient, performing a medically appropriate examination, counseling and educating the patient, ordering tests/procedures, communicating with other health care professionals, documenting clinical information in the electronic health record,  and care coordination.   JoLedell PeoplesMD Department of Hematology/Oncology [facility] Ctrhone: [phone]: [phone]: [email or web address]    03/27/2022 8:30 PM

## 2022-03-28 ENCOUNTER — Encounter: Payer: Self-pay | Admitting: Gastroenterology

## 2022-03-28 ENCOUNTER — Inpatient Hospital Stay: Payer: Medicare Other

## 2022-03-28 ENCOUNTER — Other Ambulatory Visit: Payer: Medicare Other

## 2022-03-28 ENCOUNTER — Inpatient Hospital Stay: Payer: Medicare Other | Admitting: Hematology and Oncology

## 2022-03-30 ENCOUNTER — Inpatient Hospital Stay: Payer: Medicare Other

## 2022-03-30 DIAGNOSIS — C8198 Hodgkin lymphoma, unspecified, lymph nodes of multiple sites: Secondary | ICD-10-CM

## 2022-04-05 ENCOUNTER — Other Ambulatory Visit: Payer: Self-pay

## 2022-04-06 ENCOUNTER — Other Ambulatory Visit: Payer: Self-pay | Admitting: Student

## 2022-04-06 DIAGNOSIS — C8198 Hodgkin lymphoma, unspecified, lymph nodes of multiple sites: Secondary | ICD-10-CM

## 2022-04-07 ENCOUNTER — Inpatient Hospital Stay (HOSPITAL_COMMUNITY)
Admission: RE | Admit: 2022-04-07 | Discharge: 2022-04-07 | Disposition: A | Discharge status: Home / Self Care | Payer: Medicare Other | Source: Ambulatory Visit | Attending: Hematology and Oncology | Admitting: Hematology and Oncology

## 2022-04-07 ENCOUNTER — Inpatient Hospital Stay (HOSPITAL_COMMUNITY)
Admission: RE | Admit: 2022-04-07 | Discharge: 2022-04-07 | Disposition: A | Discharge status: Home / Self Care | Payer: Medicare Other | Source: Ambulatory Visit

## 2022-04-10 ENCOUNTER — Telehealth: Payer: Self-pay

## 2022-04-10 ENCOUNTER — Other Ambulatory Visit: Payer: Self-pay | Admitting: Physician Assistant

## 2022-04-10 DIAGNOSIS — C8198 Hodgkin lymphoma, unspecified, lymph nodes of multiple sites: Secondary | ICD-10-CM

## 2022-04-10 MED FILL — Dexamethasone Sodium Phosphate Inj 100 MG/10ML: INTRAMUSCULAR | Qty: 1 | Status: AC

## 2022-04-10 NOTE — Telephone Encounter (Signed)
Spoke with Diane,scheduler at [facility]. Advised her we needed to cancel pt's treatment for tomorrow 04/11/22 due to the status of her current port but we can still see pt if needed. She said pt is doing good and does not need to be seen. Advised her we will reschedule her treatment after new port placement scheduled for 3/15.  She agreed to plan

## 2022-04-11 ENCOUNTER — Inpatient Hospital Stay: Payer: Medicare Other

## 2022-04-11 ENCOUNTER — Inpatient Hospital Stay: Payer: Medicare Other | Admitting: Physician Assistant

## 2022-04-11 ENCOUNTER — Other Ambulatory Visit: Payer: Medicare Other

## 2022-04-12 ENCOUNTER — Other Ambulatory Visit: Payer: Self-pay

## 2022-04-13 ENCOUNTER — Inpatient Hospital Stay: Payer: Medicare Other

## 2022-04-20 ENCOUNTER — Other Ambulatory Visit: Payer: Self-pay | Admitting: Student

## 2022-04-20 ENCOUNTER — Other Ambulatory Visit: Payer: Self-pay | Admitting: Radiology

## 2022-04-20 MED ORDER — SODIUM CHLORIDE 0.9 % IV SOLN: INTRAVENOUS | Status: AC

## 2022-04-20 NOTE — H&P (Signed)
Referring Physician(s): Dorsey,John T IV  Supervising Physician: Corrie Mckusick  Patient Status:  [facility]  Chief Complaint: "I'm getting another port a cath"   Subjective: Patient familiar to IR service from Port-A-Cath placement on 01/10/2022 and Port-A-Cath removal on 02/28/2022 secondary to neutropenic fevers. Catheter tip culture was negative.  Past medical history significant for anemia, arthritis, asthma, depression, diabetes, GERD, gout, hypercholesterolemia, hypertension, obesity, NASH, schizophrenia, prior stroke and now with newly diagnosed Hodgkin's lymphoma.  She is scheduled today for new Port-A-Cath placement to assist with treatment. She denies fever,HA,CP,dyspnea, abd/back pain,N/V or bleeding. She does have occ cough.    Past Medical History:  Diagnosis Date   AKI (acute kidney injury) ([place]) 12/17/2017   Allergic rhinitis    Anemia    Arthritis    Asthma    Autonomic neuropathy    Depression    Diabetes mellitus    Diabetes mellitus without complication (HCC)    E coli infection    GERD (gastroesophageal reflux disease)    GERD (gastroesophageal reflux disease)    Gout    Hepatitis A    High cholesterol    Hypertension    Insomnia    Kidney failure    Metabolic encephalopathy    Morbid obesity (HCC)    NASH (nonalcoholic steatohepatitis)    Pneumonia 12/17/2017   Schizophrenia ([place])    Stroke ([place])    Vitamin B deficiency    Vitamin D deficiency    Past Surgical History:  Procedure Laterality Date   CYST EXCISION     buttucks   DENTAL SURGERY     IR IMAGING GUIDED PORT INSERTION  01/10/2022   IR REMOVAL TUN ACCESS W/ PORT W/O FL MOD SED  02/28/2022   TONSILLECTOMY       Allergies: Emend [aprepitant], Vancomycin, Fosaprepitant, Lasix [furosemide], and Lasix [furosemide]  Medications: Prior to Admission medications   Medication Sig Start Date End Date Taking? Authorizing Provider  acetaminophen (TYLENOL) 500 MG tablet Take 1,000 mg by mouth  3 (three) times daily as needed for moderate pain.    [provider]  albuterol (VENTOLIN HFA) 108 (90 Base) MCG/ACT inhaler Inhale 2 puffs into the lungs every 4 (four) hours as needed for wheezing or shortness of breath.    [provider]  allopurinol (ZYLOPRIM) 100 MG tablet Take 100 mg by mouth daily. 01/19/22   [provider]  amLODipine (NORVASC) 10 MG tablet Take 10 mg by mouth daily.    [provider]  calamine lotion Apply 1 Application topically 2 (two) times daily. 03/02/22   Georgette Shell, MD  carvedilol (COREG) 6.25 MG tablet Take 6.25 mg by mouth 2 (two) times daily with a meal.    [provider]  cefTRIAXone (ROCEPHIN) IVPB Inject 2 g into the vein daily. Indication:  port infection First Dose: No Last Day of Therapy:  03/14/2022 Labs - Once weekly:  CBC/D and BMP, Labs - Every other week:  ESR and CRP Method of administration: IV Push Method of administration may be changed at the discretion of home infusion pharmacist based upon assessment of the patient and/or caregiver's ability to self-administer the medication ordered. Patient not taking: Reported on 03/16/2022 03/02/22   Georgette Shell, MD  cholecalciferol (VITAMIN D3) 25 MCG (1000 UNIT) tablet Take 2,000 Units by mouth daily.    [provider]  doxepin (SINEQUAN) 25 MG capsule Take 25 mg by mouth at bedtime.    [provider]  famotidine (PEPCID) 20 MG tablet Take 20 mg by mouth 2 (two) times daily.    [provider]  fluticasone (FLONASE) 50 MCG/ACT nasal spray Place 1 spray into both nostrils daily.    [provider]  glipiZIDE (GLUCOTROL XL) 10 MG 24 hr tablet Take 10 mg by mouth in the morning and at bedtime. 03/13/22   [provider]  guaiFENesin (MUCINEX) 600 MG 12 hr tablet Take 600 mg by mouth every 12 (twelve) hours.    [provider]  hydrOXYzine (ATARAX/VISTARIL) 25 MG tablet Take 25 mg by mouth in  the morning and at bedtime.    [provider]  insulin aspart (NOVOLOG) 100 UNIT/ML injection Inject 0-12 Units into the skin See admin instructions. Inject 0-12 units into the skin 4 times a day (before meals and at bedtime) per sliding scale: BGL <60 or >400  = CALL MD; 60-200 = 0 units; 201-250 = 2 units; 251-300 = 4 units; 301-350 = 6 units; 351-400 = 8 units; 401-450 = 10 units; >450 = 12 units 07/31/19   Annita Brod, MD  lactose free nutrition (BOOST) LIQD Take 237 mLs by mouth 2 (two) times daily between meals.    [provider]  LANTUS SOLOSTAR 100 UNIT/ML Solostar Pen Inject 28-38 Units into the skin 2 (two) times daily. Inject 38 units in the morning and Inject 28 units at bedtime 02/02/22   [provider]  lidocaine 4 % Place 1 patch onto the skin daily.    [provider]  lidocaine-prilocaine (EMLA) cream Apply 1 Application topically as needed. 01/16/22   Dede Query T, PA-C  lip balm (CARMEX) ointment Apply 1 Application topically as needed for lip care (cold sores). 03/02/22   Georgette Shell, MD  lisinopril (ZESTRIL) 5 MG tablet Take 5 mg by mouth daily.    [provider]  loperamide (IMODIUM) 2 MG capsule Take 1 capsule (2 mg total) by mouth as needed for diarrhea or loose stools. 09/09/19   Pokhrel, Corrie Mckusick, MD  loratadine (CLARITIN) 10 MG tablet Take 10 mg by mouth daily.    [provider]  miconazole nitrate (MICATIN) POWD Apply 1 Application topically 2 (two) times daily. 03/02/22   Georgette Shell, MD  Mouthwashes (MOUTH RINSE) LIQD solution 15 mLs by Mouth Rinse route as needed (for oral care). 03/02/22   Georgette Shell, MD  mupirocin ointment (BACTROBAN) 2 % Apply 1 Application topically 2 (two) times daily.    [provider]  Nystatin (GERHARDT'S BUTT CREAM) CREA Apply 1 Application topically 4 (four) times daily. 03/02/22   Georgette Shell, MD  nystatin (MYCOSTATIN) 100000 UNIT/ML  suspension Take 5 mLs (500,000 Units total) by mouth 4 (four) times daily. 03/02/22   Georgette Shell, MD  Pollen Extracts (PROSTAT PO) Take 30 mLs by mouth in the morning and at bedtime.    [provider]  Polyethyl Glycol-Propyl Glycol (SYSTANE ULTRA) 0.4-0.3 % SOLN Place 2 drops into both eyes in the morning and at bedtime.    [provider]  prochlorperazine (COMPAZINE) 10 MG tablet Take 1 tablet (10 mg total) by mouth every 6 (six) hours as needed for nausea or vomiting. 01/16/22   Lincoln Brigham, PA-C  Simethicone (GAS-X ULTRA STRENGTH) 180 MG CAPS Take 1 capsule by mouth every 8 (eight) hours as needed (gas).    [provider]  Skin Protectants, Misc. (EUCERIN) cream Apply 1 Application topically 2 (two) times daily. Where  skin is peeling    [provider]  traMADol (ULTRAM) 50 MG tablet Take 1 tablet (50 mg total) by mouth every 6 (six) hours as needed for moderate pain. 03/02/22   Georgette Shell, MD  zinc oxide 20 % ointment Apply 1 Application topically See admin instructions. Every shift and as needed (bilateral buttock)    [provider]     Vital Signs: Vitals:   04/21/22 1224  BP: (!) 151/87  Pulse: 67  Resp: 16  Temp: 97.9 F (36.6 C)  SpO2: 98%       Code Status: FULL CODE  Physical Exam:  awake/answers questions appropriately; chest- CTA bilat ant; heart- nl rate, some occ ectopy; abd-obese, soft,+BS,NT; no LE edema  Imaging: No results found.  Labs:  CBC: Recent Labs    03/01/22 0422 03/02/22 0450 03/03/22 0939 03/14/22 1008  WBC 7.6 5.3 10.6* 10.8*  HGB 9.4* 9.6* 8.7* 10.1*  HCT 29.3* 29.8* 26.9* 32.1*  PLT 109* 107* 136* 209    COAGS: Recent Labs    12/27/21 0932 02/10/22 2009 02/23/22 1222  INR 1.1 1.1 1.4*  APTT  --  37* 36    BMP: Recent Labs    03/01/22 0422 03/02/22 0450 03/03/22 0939 03/14/22 1008  NA 139 137 135 138  K 3.3* 3.4* 3.7 4.2  CL 107 102 99 104  CO2 24  23 23 25   GLUCOSE 154* 203* 160* 169*  BUN 12 13 14 17   CALCIUM 8.0* 7.9* 7.8* 9.2  CREATININE 0.69 0.73 0.76 0.77  GFRNONAA >60 >60 >60 >60    LIVER FUNCTION TESTS: Recent Labs    03/01/22 0422 03/02/22 0450 03/03/22 0939 03/14/22 1008  BILITOT 1.0 0.9 1.4* 0.6  AST 41 24 33 20  ALT 64* 44 31 23  ALKPHOS 355* 355* 298* 295*  PROT 5.3* 5.4* 5.0* 6.4*  ALBUMIN 2.3* 2.5* 2.4* 3.2*    Assessment and Plan: Patient familiar to IR service from Port-A-Cath placement on 01/10/2022 and Port-A-Cath removal on 02/28/2022 secondary to neutropenic fevers. Catheter tip culture was negative.  Past medical history significant for anemia, arthritis, asthma, depression, diabetes, GERD, gout, hypercholesterolemia, hypertension, obesity, NASH, schizophrenia, prior stroke and now with newly diagnosed Hodgkin's lymphoma.  She is scheduled today for new Port-A-Cath placement to assist with treatment.Risks and benefits of image guided port-a-catheter placement was discussed with the patient including, but not limited to bleeding, infection, pneumothorax, or fibrin sheath development and need for additional procedures.  All of the patient's questions were answered, patient is agreeable to proceed. Consent signed and in chart.    Electronically Signed: D. Rowe Robert, PA-C 04/20/2022, 4:05 PM   I spent a total of 20 minutes at the the patient's bedside AND on the patient's hospital floor or unit, greater than 50% of which was counseling/coordinating care for port a cath placement

## 2022-04-21 ENCOUNTER — Ambulatory Visit (HOSPITAL_COMMUNITY)
Admission: RE | Admit: 2022-04-21 | Discharge: 2022-04-21 | Disposition: A | Discharge status: Home / Self Care | Payer: Medicare Other | Source: Ambulatory Visit | Attending: Hematology and Oncology | Admitting: Hematology and Oncology

## 2022-04-21 ENCOUNTER — Encounter (HOSPITAL_COMMUNITY): Payer: Self-pay

## 2022-04-21 DIAGNOSIS — M109 Gout, unspecified: Secondary | ICD-10-CM | POA: Diagnosis not present

## 2022-04-21 DIAGNOSIS — E119 Type 2 diabetes mellitus without complications: Secondary | ICD-10-CM | POA: Diagnosis not present

## 2022-04-21 DIAGNOSIS — Z8673 Personal history of transient ischemic attack (TIA), and cerebral infarction without residual deficits: Secondary | ICD-10-CM | POA: Insufficient documentation

## 2022-04-21 DIAGNOSIS — E7800 Pure hypercholesterolemia, unspecified: Secondary | ICD-10-CM | POA: Diagnosis not present

## 2022-04-21 DIAGNOSIS — D649 Anemia, unspecified: Secondary | ICD-10-CM | POA: Diagnosis not present

## 2022-04-21 DIAGNOSIS — K219 Gastro-esophageal reflux disease without esophagitis: Secondary | ICD-10-CM | POA: Insufficient documentation

## 2022-04-21 DIAGNOSIS — I10 Essential (primary) hypertension: Secondary | ICD-10-CM | POA: Insufficient documentation

## 2022-04-21 DIAGNOSIS — C8198 Hodgkin lymphoma, unspecified, lymph nodes of multiple sites: Secondary | ICD-10-CM | POA: Insufficient documentation

## 2022-04-21 DIAGNOSIS — F32A Depression, unspecified: Secondary | ICD-10-CM | POA: Insufficient documentation

## 2022-04-21 HISTORY — PX: IR IMAGING GUIDED PORT INSERTION: IMG5740

## 2022-04-21 MED ORDER — MIDAZOLAM HCL 2 MG/2ML IJ SOLN
INTRAMUSCULAR | Status: AC | PRN
  Administered 2022-04-21 (×2): 1 mg via INTRAVENOUS

## 2022-04-21 MED ORDER — HEPARIN SOD (PORK) LOCK FLUSH 100 UNIT/ML IV SOLN
INTRAVENOUS | Status: AC
  Filled 2022-04-21: qty 5

## 2022-04-21 MED ORDER — DEXTROSE 50 % IV SOLN
INTRAVENOUS | Status: AC
  Filled 2022-04-21: qty 50

## 2022-04-21 MED ORDER — LIDOCAINE-EPINEPHRINE 1 %-1:100000 IJ SOLN
INTRAMUSCULAR | Status: AC
  Administered 2022-04-21: 20 mL
  Filled 2022-04-21: qty 1

## 2022-04-21 MED ORDER — DEXTROSE 50 % IV SOLN
12.5000 g | Freq: Once | INTRAVENOUS | Status: AC
  Administered 2022-04-21: 12.5 g via INTRAVENOUS

## 2022-04-21 MED ORDER — SODIUM CHLORIDE 0.9 % IV SOLN: INTRAVENOUS | Status: DC

## 2022-04-21 MED ORDER — FENTANYL CITRATE (PF) 100 MCG/2ML IJ SOLN
INTRAMUSCULAR | Status: AC
  Filled 2022-04-21: qty 2

## 2022-04-21 MED ORDER — FENTANYL CITRATE (PF) 100 MCG/2ML IJ SOLN
INTRAMUSCULAR | Status: AC | PRN
  Administered 2022-04-21 (×2): 50 ug via INTRAVENOUS

## 2022-04-21 MED ORDER — MIDAZOLAM HCL 2 MG/2ML IJ SOLN
INTRAMUSCULAR | Status: AC
  Filled 2022-04-21: qty 4

## 2022-04-21 MED ORDER — HEPARIN SOD (PORK) LOCK FLUSH 100 UNIT/ML IV SOLN
INTRAVENOUS | Status: AC | PRN
  Administered 2022-04-21: 500 [IU] via INTRAVENOUS

## 2022-04-21 NOTE — Procedures (Signed)
Interventional Radiology Procedure Note  Procedure: Placement of a right IJ approach single lumen PowerPort.  Tip is positioned at the superior cavoatrial junction and catheter is ready for immediate use.  Complications: None Recommendations:  - Ok to shower tomorrow - Do not submerge for 7 days - Routine line care   Signed,  Micaella Gitto S. Iliyana Convey, DO   

## 2022-04-21 NOTE — Discharge Instructions (Signed)

## 2022-04-24 LAB — GLUCOSE, CAPILLARY
Glucose-Capillary: 102 mg/dL — ABNORMAL HIGH (ref 70–99)
Glucose-Capillary: 68 mg/dL — ABNORMAL LOW (ref 70–99)

## 2022-04-24 MED FILL — Dexamethasone Sodium Phosphate Inj 100 MG/10ML: INTRAMUSCULAR | Qty: 1 | Status: AC

## 2022-04-25 ENCOUNTER — Inpatient Hospital Stay: Payer: Medicare Other

## 2022-04-25 ENCOUNTER — Inpatient Hospital Stay: Payer: Medicare Other | Attending: Physician Assistant | Admitting: Hematology and Oncology

## 2022-04-25 ENCOUNTER — Other Ambulatory Visit: Payer: Medicare Other

## 2022-04-25 ENCOUNTER — Other Ambulatory Visit: Payer: Self-pay

## 2022-04-25 VITALS — BP 149/57 | HR 73 | Temp 97.8°F | Resp 16

## 2022-04-25 DIAGNOSIS — D539 Nutritional anemia, unspecified: Secondary | ICD-10-CM

## 2022-04-25 DIAGNOSIS — R5081 Fever presenting with conditions classified elsewhere: Secondary | ICD-10-CM | POA: Insufficient documentation

## 2022-04-25 DIAGNOSIS — Z95828 Presence of other vascular implants and grafts: Secondary | ICD-10-CM | POA: Diagnosis not present

## 2022-04-25 DIAGNOSIS — C8173 Other classical Hodgkin lymphoma, intra-abdominal lymph nodes: Secondary | ICD-10-CM | POA: Diagnosis not present

## 2022-04-25 DIAGNOSIS — C8198 Hodgkin lymphoma, unspecified, lymph nodes of multiple sites: Secondary | ICD-10-CM | POA: Diagnosis not present

## 2022-04-25 DIAGNOSIS — Z5111 Encounter for antineoplastic chemotherapy: Secondary | ICD-10-CM | POA: Diagnosis not present

## 2022-04-25 DIAGNOSIS — D701 Agranulocytosis secondary to cancer chemotherapy: Secondary | ICD-10-CM | POA: Diagnosis not present

## 2022-04-25 LAB — CBC WITH DIFFERENTIAL (CANCER CENTER ONLY)
Abs Immature Granulocytes: 0.02 10*3/uL (ref 0.00–0.07)
Basophils Absolute: 0 10*3/uL (ref 0.0–0.1)
Basophils Relative: 0 %
Eosinophils Absolute: 0.2 10*3/uL (ref 0.0–0.5)
Eosinophils Relative: 2 %
HCT: 36.1 % (ref 36.0–46.0)
Hemoglobin: 12 g/dL (ref 12.0–15.0)
Immature Granulocytes: 0 %
Lymphocytes Relative: 37 %
Lymphs Abs: 3.4 10*3/uL (ref 0.7–4.0)
MCH: 30.3 pg (ref 26.0–34.0)
MCHC: 33.2 g/dL (ref 30.0–36.0)
MCV: 91.2 fL (ref 80.0–100.0)
Monocytes Absolute: 0.6 10*3/uL (ref 0.1–1.0)
Monocytes Relative: 6 %
Neutro Abs: 5.1 10*3/uL (ref 1.7–7.7)
Neutrophils Relative %: 55 %
Platelet Count: 143 10*3/uL — ABNORMAL LOW (ref 150–400)
RBC: 3.96 MIL/uL (ref 3.87–5.11)
RDW: 15.4 % (ref 11.5–15.5)
WBC Count: 9.4 10*3/uL (ref 4.0–10.5)
nRBC: 0 % (ref 0.0–0.2)

## 2022-04-25 LAB — CMP (CANCER CENTER ONLY)
ALT: 14 U/L (ref 0–44)
AST: 15 U/L (ref 15–41)
Albumin: 3.9 g/dL (ref 3.5–5.0)
Alkaline Phosphatase: 138 U/L — ABNORMAL HIGH (ref 38–126)
Anion gap: 7 (ref 5–15)
BUN: 19 mg/dL (ref 8–23)
CO2: 26 mmol/L (ref 22–32)
Calcium: 9.4 mg/dL (ref 8.9–10.3)
Chloride: 105 mmol/L (ref 98–111)
Creatinine: 0.73 mg/dL (ref 0.44–1.00)
GFR, Estimated: >60 mL/min (ref >60)
Glucose, Bld: 149 mg/dL — ABNORMAL HIGH (ref 70–99)
Potassium: 3.7 mmol/L (ref 3.5–5.1)
Sodium: 138 mmol/L (ref 135–145)
Total Bilirubin: 0.3 mg/dL (ref 0.3–1.2)
Total Protein: 6.8 g/dL (ref 6.5–8.1)

## 2022-04-25 MED ORDER — VINBLASTINE SULFATE CHEMO INJECTION 1 MG/ML
6.0000 mg/m2 | Freq: Once | INTRAVENOUS | Status: AC
  Administered 2022-04-25: 12.5 mg via INTRAVENOUS
  Filled 2022-04-25: qty 12.5

## 2022-04-25 MED ORDER — SODIUM CHLORIDE 0.9% FLUSH
10.0000 mL | Freq: Once | INTRAVENOUS | Status: AC
  Administered 2022-04-25: 10 mL

## 2022-04-25 MED ORDER — SODIUM CHLORIDE 0.9 % IV SOLN
10.0000 mg | Freq: Once | INTRAVENOUS | Status: AC
  Administered 2022-04-25: 10 mg via INTRAVENOUS
  Filled 2022-04-25 (×2): qty 1
  Filled 2022-04-25: qty 10
  Filled 2022-04-25 (×2): qty 1

## 2022-04-25 MED ORDER — PALONOSETRON HCL INJECTION 0.25 MG/5ML
0.2500 mg | Freq: Once | INTRAVENOUS | Status: AC
  Administered 2022-04-25: 0.25 mg via INTRAVENOUS
  Filled 2022-04-25: qty 5

## 2022-04-25 MED ORDER — SODIUM CHLORIDE 0.9 % IV SOLN
375.0000 mg/m2 | Freq: Once | INTRAVENOUS | Status: AC
  Administered 2022-04-25: 800 mg via INTRAVENOUS
  Filled 2022-04-25: qty 80

## 2022-04-25 MED ORDER — HEPARIN SOD (PORK) LOCK FLUSH 100 UNIT/ML IV SOLN
500.0000 [IU] | Freq: Once | INTRAVENOUS | Status: AC
  Administered 2022-04-25: 500 [IU] via INTRAVENOUS

## 2022-04-25 MED ORDER — DOXORUBICIN HCL CHEMO IV INJECTION 2 MG/ML
25.0000 mg/m2 | Freq: Once | INTRAVENOUS | Status: AC
  Administered 2022-04-25: 52 mg via INTRAVENOUS
  Filled 2022-04-25: qty 26

## 2022-04-25 MED ORDER — SODIUM CHLORIDE 0.9 % IV SOLN: Freq: Once | INTRAVENOUS | Status: AC

## 2022-04-25 MED ORDER — SODIUM CHLORIDE 0.9% FLUSH
10.0000 mL | Freq: Once | INTRAVENOUS | Status: AC
  Administered 2022-04-25: 10 mL via INTRAVENOUS

## 2022-04-25 NOTE — Patient Instructions (Signed)
[facility]  Discharge Instructions: Thank you for choosing [facility] to provide your oncology and hematology care.   If you have a lab appointment with the [facility], please go directly to the [facility] and check in at the registration area.   Wear comfortable clothing and clothing appropriate for easy access to any Portacath or PICC line.   We strive to give you quality time with your provider. You may need to reschedule your appointment if you arrive late (15 or more minutes).  Arriving late affects you and other patients whose appointments are after yours.  Also, if you miss three or more appointments without notifying the office, you may be dismissed from the clinic at the provider's discretion.      For prescription refill requests, have your pharmacy contact our office and allow 72 hours for refills to be completed.    Today you received the following chemotherapy and/or immunotherapy agents: Adriamycin/Vinblastine/DTIC    To help prevent nausea and vomiting after your treatment, we encourage you to take your nausea medication as directed.  BELOW ARE SYMPTOMS THAT SHOULD BE REPORTED IMMEDIATELY: *FEVER GREATER THAN 100.4 F (38 C) OR HIGHER *CHILLS OR SWEATING *NAUSEA AND VOMITING THAT IS NOT CONTROLLED WITH YOUR NAUSEA MEDICATION *UNUSUAL SHORTNESS OF BREATH *UNUSUAL BRUISING OR BLEEDING *URINARY PROBLEMS (pain or burning when urinating, or frequent urination) *BOWEL PROBLEMS (unusual diarrhea, constipation, pain near the anus) TENDERNESS IN MOUTH AND THROAT WITH OR WITHOUT PRESENCE OF ULCERS (sore throat, sores in mouth, or a toothache) UNUSUAL RASH, SWELLING OR PAIN  UNUSUAL VAGINAL DISCHARGE OR ITCHING   Items with * indicate a potential emergency and should be followed up as soon as possible or go to the Emergency Department if any problems should occur.  Please show the CHEMOTHERAPY ALERT CARD or IMMUNOTHERAPY  ALERT CARD at check-in to the Emergency Department and triage nurse.  Should you have questions after your visit or need to cancel or reschedule your appointment, please contact [facility]  Dept: [phone]  and follow the prompts.  Office hours are 8:00 a.m. to 4:30 p.m. Monday - Friday. Please note that voicemails left after 4:00 p.m. may not be returned until the following business day.  We are closed weekends and major holidays. You have access to a nurse at all times for urgent questions. Please call the main number to the clinic Dept: [phone] and follow the prompts.   For any non-urgent questions, you may also contact your provider using MyChart. We now offer e-Visits for anyone 46 and older to request care online for non-urgent symptoms. For details visit [email or web address].   Also download the MyChart app! Go to the app store, search "MyChart", open the app, select [facility], and log in with your MyChart username and password.

## 2022-04-25 NOTE — Progress Notes (Signed)
Per Lorenso Courier, MD okay to proceed with weight from 03/02/22 due to patient unable to stand.

## 2022-04-25 NOTE — Progress Notes (Signed)
[place] Telephone:([phone]) [phone]   [phone]) [phone]  PROGRESS NOTE  Patient Care Team: Pcp, No as PCP - General [name], MD (Internal Medicine)  Hematological/Oncological History # Hodkgin's Lymphoma, Stage III 12/09/2021: CT Abdomen/Pelvis showed retroperitoneal adenopathy, with 2.1 x 3.6 cm node 12/20/2021: establish care with [name] [place] [facility]  12/27/2021: CT abdominal mass biopsy performed, findings consistent with classical Hodgkin's lymphoma. 01/05/2022: PET CT scan showed hypermetabolic abdominal adenopathy consistent with lymphoma.  She has mild hypermetabolism involving the mediastinal and bilateral axillary nodes. 01/17/2022: Cycle 1, Day 1 of AVD chemotherapy 01/31/2022: Cycle 1, Day 15 of AVD chemotherapy 04/24/2021: Cycle 2 Day 15 of AVD chemotherapy  Interval History:  Heather Petty 72 y.o. female with medical history significant for newly diagnosed Hodgkin's lymphoma who presents for a follow up visit. The patient's last visit was on 03/14/2022. In the interim she had her port placed.   On exam today [name] reports she is doing much better as compared to our prior visit.  She reports that she was having issues with her skin drying out and headaches, sores, and wounds.  She notes that these are all in the process of healing.  Her energy levels are much better and she is doing her best to try to exercise.  Her appetite is also greatly improved and she is eating more and staying more hydrated.  Previously she was having peeling and lip bumps which were "horrible".  She notes that she also changed moisturizers which has been helping with her dry skin.  She notes that most everything is now more stable than during our prior visit.  Overall she is willing and able to proceed with chemotherapy treatment at this time..  She denies any lymphadenopathy and is not having any fevers, chills, sweats.  A full 10 point ROS is otherwise negative.   MEDICAL  HISTORY:  Past Medical History:  Diagnosis Date   AKI (acute kidney injury) ([place]) 12/17/2017   Allergic rhinitis    Anemia    Arthritis    Asthma    Autonomic neuropathy    Depression    Diabetes mellitus    Diabetes mellitus without complication (HCC)    E coli infection    GERD (gastroesophageal reflux disease)    GERD (gastroesophageal reflux disease)    Gout    Hepatitis A    High cholesterol    Hypertension    Insomnia    Kidney failure    Metabolic encephalopathy    Morbid obesity (HCC)    NASH (nonalcoholic steatohepatitis)    Pneumonia 12/17/2017   Schizophrenia ([place])    Stroke ([place])    Vitamin B deficiency    Vitamin D deficiency     SURGICAL HISTORY: Past Surgical History:  Procedure Laterality Date   CYST EXCISION     buttucks   DENTAL SURGERY     IR IMAGING GUIDED PORT INSERTION  01/10/2022   IR IMAGING GUIDED PORT INSERTION  04/21/2022   IR REMOVAL TUN ACCESS W/ PORT W/O FL MOD SED  02/28/2022   TONSILLECTOMY      SOCIAL HISTORY: Social History   Socioeconomic History   Marital status: Unknown    Spouse name: Not on file   Number of children: 2   Years of education: Not on file   Highest education level: Not on file  Occupational History   Occupation: retired/disabled  Tobacco Use   Smoking status: Never   Smokeless tobacco: Never  Vaping Use  Vaping Use: Never used  Substance and Sexual Activity   Alcohol use: Never   Drug use: Never   Sexual activity: Not Currently    Birth control/protection: None  Other Topics Concern   Not on file  Social History Narrative   ** Merged History Encounter **       Social Determinants of Health   Financial Resource Strain: Not on file  Food Insecurity: No Food Insecurity (02/26/2022)   Hunger Vital Sign    Worried About Running Out of Food in the Last Year: Never true    Ran Out of Food in the Last Year: Never true  Transportation Needs: No Transportation Needs (02/26/2022)   PRAPARE -  Hydrologist (Medical): No    Lack of Transportation (Non-Medical): No  Physical Activity: Not on file  Stress: Not on file  Social Connections: Not on file  Intimate Partner Violence: Not At Risk (02/26/2022)   Humiliation, Afraid, Rape, and Kick questionnaire    Fear of Current or Ex-Partner: No    Emotionally Abused: No    Physically Abused: No    Sexually Abused: No    FAMILY HISTORY: Family History  Adopted: Yes  Problem Relation Age of Onset   COPD Daughter     ALLERGIES:  is allergic to [place] [aprepitant], vancomycin, fosaprepitant, lasix [furosemide], lasix [furosemide], and pineapple.  MEDICATIONS:  Current Outpatient Medications  Medication Sig Dispense Refill   acetaminophen (TYLENOL) 500 MG tablet Take 1,000 mg by mouth 3 (three) times daily as needed for moderate pain.     albuterol (VENTOLIN HFA) 108 (90 Base) MCG/ACT inhaler Inhale 2 puffs into the lungs every 4 (four) hours as needed for wheezing or shortness of breath.     allopurinol (ZYLOPRIM) 100 MG tablet Take 100 mg by mouth daily.     amLODipine (NORVASC) 10 MG tablet Take 10 mg by mouth daily.     calamine lotion Apply 1 Application topically 2 (two) times daily.  0   carvedilol (COREG) 6.25 MG tablet Take 6.25 mg by mouth 2 (two) times daily with a meal.     cefTRIAXone (ROCEPHIN) IVPB Inject 2 g into the vein daily. Indication:  port infection First Dose: No Last Day of Therapy:  03/14/2022 Labs - Once weekly:  CBC/D and BMP, Labs - Every other week:  ESR and CRP Method of administration: IV Push Method of administration may be changed at the discretion of home infusion pharmacist based upon assessment of the patient and/or caregiver's ability to self-administer the medication ordered. (Patient not taking: Reported on 03/16/2022) 12 Units 0   cholecalciferol (VITAMIN D3) 25 MCG (1000 UNIT) tablet Take 2,000 Units by mouth daily.     doxepin (SINEQUAN) 25 MG capsule Take 25 mg by  mouth at bedtime.     famotidine (PEPCID) 20 MG tablet Take 20 mg by mouth 2 (two) times daily.     fluticasone (FLONASE) 50 MCG/ACT nasal spray Place 1 spray into both nostrils daily.     glipiZIDE (GLUCOTROL XL) 10 MG 24 hr tablet Take 10 mg by mouth in the morning and at bedtime.     guaiFENesin (MUCINEX) 600 MG 12 hr tablet Take 600 mg by mouth every 12 (twelve) hours.     hydrOXYzine (ATARAX/VISTARIL) 25 MG tablet Take 25 mg by mouth in the morning and at bedtime.     insulin aspart (NOVOLOG) 100 UNIT/ML injection Inject 0-12 Units into the skin See admin instructions. Inject  0-12 units into the skin 4 times a day (before meals and at bedtime) per sliding scale: BGL <60 or >400  = CALL MD; 60-200 = 0 units; 201-250 = 2 units; 251-300 = 4 units; 301-350 = 6 units; 351-400 = 8 units; 401-450 = 10 units; >450 = 12 units     insulin glargine (LANTUS) 100 UNIT/ML injection Inject 28 Units into the skin at bedtime.     insulin glargine (LANTUS) 100 UNIT/ML injection Inject 40 Units into the skin daily.     ipratropium-albuterol (DUONEB) 0.5-2.5 (3) MG/3ML SOLN Take 3 mLs by nebulization.     ipratropium-albuterol (DUONEB) 0.5-2.5 (3) MG/3ML SOLN Take 3 mLs by nebulization 3 (three) times daily.     lactose free nutrition (BOOST) LIQD Take 237 mLs by mouth 2 (two) times daily between meals.     LANTUS SOLOSTAR 100 UNIT/ML Solostar Pen Inject 28-38 Units into the skin 2 (two) times daily. Inject 38 units in the morning and Inject 28 units at bedtime     lidocaine 4 % Place 1 patch onto the skin daily.     lidocaine-prilocaine (EMLA) cream Apply 1 Application topically as needed. 30 g 0   lip balm (CARMEX) ointment Apply 1 Application topically as needed for lip care (cold sores). 7 g 0   lisinopril (ZESTRIL) 5 MG tablet Take 5 mg by mouth daily.     loperamide (IMODIUM) 2 MG capsule Take 1 capsule (2 mg total) by mouth as needed for diarrhea or loose stools. 30 capsule 0   loratadine (CLARITIN) 10 MG  tablet Take 10 mg by mouth daily.     miconazole nitrate (MICATIN) POWD Apply 1 Application topically 2 (two) times daily.  0   Mouthwashes (MOUTH RINSE) LIQD solution 15 mLs by Mouth Rinse route as needed (for oral care).  0   mupirocin ointment (BACTROBAN) 2 % Apply 1 Application topically 2 (two) times daily.     Nystatin (GERHARDT'S BUTT CREAM) CREA Apply 1 Application topically 4 (four) times daily.     nystatin (MYCOSTATIN) 100000 UNIT/ML suspension Take 5 mLs (500,000 Units total) by mouth 4 (four) times daily. 60 mL 0   Pollen Extracts (PROSTAT PO) Take 30 mLs by mouth in the morning and at bedtime.     Polyethyl Glycol-Propyl Glycol (SYSTANE ULTRA) 0.4-0.3 % SOLN Place 2 drops into both eyes in the morning and at bedtime.     prochlorperazine (COMPAZINE) 10 MG tablet Take 1 tablet (10 mg total) by mouth every 6 (six) hours as needed for nausea or vomiting. 30 tablet 0   Propylene Glycol-Glycerin 0.6-0.6 % SOLN Apply 0.6 % to eye 2 (two) times daily. Place two drops into both eyes twice a day.  Wait three minutes before administering another eye med     Simethicone (GAS-X ULTRA STRENGTH) 180 MG CAPS Take 1 capsule by mouth every 8 (eight) hours as needed (gas).     Skin Protectants, Misc. (EUCERIN) cream Apply 1 Application topically 2 (two) times daily. Where skin is peeling     traMADol (ULTRAM) 50 MG tablet Take 1 tablet (50 mg total) by mouth every 6 (six) hours as needed for moderate pain. 30 tablet 0   triamcinolone ointment (KENALOG) 0.5 % Apply 1 Application topically 2 (two) times daily.     zinc oxide 20 % ointment Apply 1 Application topically See admin instructions. Every shift and as needed (bilateral buttock)     No current facility-administered medications for this visit.  Facility-Administered Medications Ordered in Other Visits  Medication Dose Route Frequency Provider Last Rate Last Admin   0.9 %  sodium chloride infusion   Intravenous Continuous Allred, Darrell K, PA-C        dacarbazine (DTIC) 800 mg in sodium chloride 0.9 % 250 mL chemo infusion  375 mg/m2 (Treatment Plan Recorded) Intravenous Once Orson Slick, MD        REVIEW OF SYSTEMS:   Constitutional: ( - ) fevers, ( - )  chills , ( - ) night sweats Eyes: ( - ) blurriness of vision, ( - ) double vision, ( - ) watery eyes Ears, nose, mouth, throat, and face: ( - ) mucositis, ( - ) sore throat Respiratory: ( - ) cough, ( - ) dyspnea, ( - ) wheezes Cardiovascular: ( - ) palpitation, ( - ) chest discomfort, ( - ) lower extremity swelling Gastrointestinal:  ( +) nausea, ( - ) heartburn, ( - ) change in bowel habits Skin: ( - ) abnormal skin rashes Lymphatics: ( - ) new lymphadenopathy, ( - ) easy bruising Neurological: ( - ) numbness, ( - ) tingling, ( - ) new weaknesses Behavioral/Psych: ( - ) mood change, ( - ) new changes  All other systems were reviewed with the patient and are negative.  PHYSICAL EXAMINATION: ECOG PERFORMANCE STATUS: 2 - Symptomatic, <50% confined to bed  There were no vitals filed for this visit. There were no vitals filed for this visit.  GENERAL: Well-appearing elderly female, alert, no distress and comfortable SKIN: skin color, texture, turgor are normal, no rashes or significant lesions EYES: conjunctiva are pink and non-injected, sclera clear LUNGS: clear to auscultation and percussion with normal breathing effort HEART: regular rate & rhythm and no murmurs and no lower extremity edema Musculoskeletal: no cyanosis of digits and no clubbing  PSYCH: alert & oriented x 3, fluent speech NEURO: no focal motor/sensory deficits  LABORATORY DATA:  I have reviewed the data as listed    Latest Ref Rng & Units 04/25/2022   12:36 PM 03/14/2022   10:08 AM 03/03/2022    9:39 AM  CBC  WBC 4.0 - 10.5 K/uL 9.4  10.8  10.6   Hemoglobin 12.0 - 15.0 g/dL 12.0  10.1  8.7   Hematocrit 36.0 - 46.0 % 36.1  32.1  26.9   Platelets 150 - 400 K/uL 143  209  136        Latest Ref  Rng & Units 04/25/2022   12:36 PM 03/14/2022   10:08 AM 03/03/2022    9:39 AM  CMP  Glucose 70 - 99 mg/dL 149  169  160   BUN 8 - 23 mg/dL 19  17  14    Creatinine 0.44 - 1.00 mg/dL 0.73  0.77  0.76   Sodium 135 - 145 mmol/L 138  138  135   Potassium 3.5 - 5.1 mmol/L 3.7  4.2  3.7   Chloride 98 - 111 mmol/L 105  104  99   CO2 22 - 32 mmol/L 26  25  23    Calcium 8.9 - 10.3 mg/dL 9.4  9.2  7.8   Total Protein 6.5 - 8.1 g/dL 6.8  6.4  5.0   Total Bilirubin 0.3 - 1.2 mg/dL 0.3  0.6  1.4   Alkaline Phos 38 - 126 U/L 138  295  298   AST 15 - 41 U/L 15  20  33   ALT 0 - 44 U/L 14  23  31     RADIOGRAPHIC STUDIES: IR IMAGING GUIDED PORT INSERTION  Result Date: 04/21/2022 INDICATION: 72 year old female with a history of lymphoma referred for port catheter EXAM: IMAGE GUIDED PORT CATHETER MEDICATIONS: None ANESTHESIA/SEDATION: Moderate (conscious) sedation was employed during this procedure. A total of Versed 2.0 mg and Fentanyl 100 mcg was administered intravenously. Moderate Sedation Time: 23 minutes. The patient's level of consciousness and vital signs were monitored continuously by radiology nursing throughout the procedure under my direct supervision. FLUOROSCOPY TIME:  Fluoroscopy Time: 0 minutes 12 seconds (0 mGy). COMPLICATIONS: None PROCEDURE: Informed written consent was obtained from the patient after a thorough discussion of the procedural risks, benefits and alternatives. All questions were addressed. Maximal Sterile Barrier Technique was utilized including caps, mask, sterile gowns, sterile gloves, sterile drape, hand hygiene and skin antiseptic. A timeout was performed prior to the initiation of the procedure. Ultrasound survey was performed with images stored and sent to PACs. Right IJ vein documented to be patent. The right neck and chest was prepped with chlorhexidine, and draped in the usual sterile fashion using maximum barrier technique (cap and mask, sterile gown, sterile gloves, large  sterile sheet, hand hygiene and cutaneous antiseptic). Local anesthesia was attained by infiltration with 1% lidocaine without epinephrine. Ultrasound demonstrated patency of the right internal jugular vein, and this was documented with an image. Under real-time ultrasound guidance, this vein was accessed with a 21 gauge micropuncture needle and image documentation was performed. A small dermatotomy was made at the access site with an 11 scalpel. A 0.018" wire was advanced into the SVC and used to estimate the length of the internal catheter. The access needle exchanged for a 24F micropuncture vascular sheath. The 0.018" wire was then removed and a 0.035" wire advanced into the IVC. An appropriate location for the subcutaneous reservoir was selected below the clavicle and an incision was made through the skin and underlying soft tissues. This incision site was between the clavicle and the former port catheter position. The subcutaneous tissues were then dissected using a combination of blunt and sharp surgical technique and a pocket was formed. A single lumen power injectable portacatheter was then tunneled through the subcutaneous tissues from the pocket to the dermatotomy and the port reservoir placed within the subcutaneous pocket. A Prolene suture was placed at both the right and left apex of the port, given the fact that the port was placed adjacent to a previous port catheter pocket. The venous access site was then serially dilated and a peel away vascular sheath placed over the wire. The wire was removed and the port catheter advanced into position under fluoroscopic guidance. The catheter tip is positioned in the cavoatrial junction. This was documented with a spot image. The portacatheter was then tested and found to flush and aspirate well. The port was flushed with saline followed by 100 units/mL heparinized saline. The pocket was then closed in two layers using first subdermal inverted interrupted  absorbable sutures followed by a running subcuticular suture. The epidermis was then sealed with Dermabond. The dermatotomy at the venous access site was also seal with Dermabond. Patient tolerated the procedure well and remained hemodynamically stable throughout. No complications encountered and no significant blood loss encountered IMPRESSION: Status post image guided right IJ port catheter. Signed, Dulcy Fanny. Nadene Rubins, RPVI [facility] Electronically Signed   By: Corrie Mckusick D.O.   On: 04/21/2022 15:21    ASSESSMENT & PLAN [name]  72 y.o. female with medical history significant for newly diagnosed Hodgkin's lymphoma who presents for a follow up visit.   #Hodgkin's Lymphoma, Stage III --biopsy of abdominal lymph node on 12/27/2021 showed classical Hodgkin's lymphoma.  --Pre-treatment PET scan from [facility] abdominal adenopathy along with mild hypermetabolism involving mediastinal and bilateral axillary nodes. --Recommend AVD chemotherapy x 2 cycles followed by PET CT scan.  PLAN: --today patient is due for Cycle 2, Day 15 of AVD chemotherapy.  --port placed in the interim since last visit.  --Labs from today show white blood cell 9.4, Hgb 12.0, MCV 91.2, Plt 143 --AVD chemotherapy typically given without GCSF support but given her neutropenic fever she will require it moving forward.  --RTC for Cycle 3 Day 1 of AVD chemotherapy.   #Sigmoid colonic focal hypermetabolism #Hepatic steatosis with possible cirrhosis --Will request follow up with GI to further evaluate.  #Supportive Care -- chemotherapy education complete -- port placement complete --baseline echo from 01/06/2022 showed EF 60-65% -- not planning for bleomycin, no need for PFTs.  --  compazine 10mg  PO q6H for nausea (zofran contraindicated due to QT prolongated medications).  -- allopurinol 300mg  PO daily for TLS prophylaxis -- EMLA cream for  port -- no pain medication required at this time.    No orders of the defined types were placed in this encounter.   All questions were answered. The patient knows to call the clinic with any problems, questions or concerns.  I have spent a total of 30 minutes minutes of face-to-face and non-face-to-face time, preparing to see the patient, performing a medically appropriate examination, counseling and educating the patient, ordering tests/procedures, communicating with other health care professionals, documenting clinical information in the electronic health record,  and care coordination.   Ledell Peoples, MD Department of Hematology/Oncology [place] [facility] Phone: [phone] Pager: [phone] Email: [email or web address]    04/25/2022 3:23 PM

## 2022-04-27 ENCOUNTER — Inpatient Hospital Stay: Payer: Medicare Other

## 2022-04-27 ENCOUNTER — Other Ambulatory Visit: Payer: Self-pay

## 2022-04-27 VITALS — BP 134/58 | HR 64 | Temp 98.2°F | Resp 18

## 2022-04-27 DIAGNOSIS — Z5111 Encounter for antineoplastic chemotherapy: Secondary | ICD-10-CM | POA: Diagnosis not present

## 2022-04-27 DIAGNOSIS — C8198 Hodgkin lymphoma, unspecified, lymph nodes of multiple sites: Secondary | ICD-10-CM

## 2022-04-27 MED ORDER — PEGFILGRASTIM-CBQV 6 MG/0.6ML ~~LOC~~ SOSY
6.0000 mg | PREFILLED_SYRINGE | Freq: Once | SUBCUTANEOUS | Status: AC
  Administered 2022-04-27: 6 mg via SUBCUTANEOUS
  Filled 2022-04-27: qty 0.6

## 2022-05-01 ENCOUNTER — Encounter: Payer: Self-pay | Admitting: Physician Assistant

## 2022-05-01 ENCOUNTER — Encounter: Payer: Self-pay | Admitting: Hematology and Oncology

## 2022-05-08 MED FILL — Dexamethasone Sodium Phosphate Inj 100 MG/10ML: INTRAMUSCULAR | Qty: 1 | Status: AC

## 2022-05-09 ENCOUNTER — Encounter: Payer: Self-pay | Admitting: General Practice

## 2022-05-09 ENCOUNTER — Other Ambulatory Visit: Payer: Self-pay

## 2022-05-09 ENCOUNTER — Inpatient Hospital Stay (HOSPITAL_BASED_OUTPATIENT_CLINIC_OR_DEPARTMENT_OTHER): Payer: Medicare Other | Admitting: Physician Assistant

## 2022-05-09 ENCOUNTER — Inpatient Hospital Stay: Payer: Medicare Other | Attending: Physician Assistant

## 2022-05-09 ENCOUNTER — Inpatient Hospital Stay: Payer: Medicare Other

## 2022-05-09 VITALS — BP 131/55 | HR 93 | Wt 208.5 lb

## 2022-05-09 VITALS — BP 128/59 | HR 78 | Temp 97.5°F | Resp 18

## 2022-05-09 DIAGNOSIS — C8173 Other classical Hodgkin lymphoma, intra-abdominal lymph nodes: Secondary | ICD-10-CM | POA: Insufficient documentation

## 2022-05-09 DIAGNOSIS — Z5111 Encounter for antineoplastic chemotherapy: Secondary | ICD-10-CM | POA: Insufficient documentation

## 2022-05-09 DIAGNOSIS — C8198 Hodgkin lymphoma, unspecified, lymph nodes of multiple sites: Secondary | ICD-10-CM

## 2022-05-09 DIAGNOSIS — Z95828 Presence of other vascular implants and grafts: Secondary | ICD-10-CM

## 2022-05-09 DIAGNOSIS — Z5189 Encounter for other specified aftercare: Secondary | ICD-10-CM | POA: Diagnosis not present

## 2022-05-09 LAB — CBC WITH DIFFERENTIAL (CANCER CENTER ONLY)
Abs Immature Granulocytes: 0.1 10*3/uL — ABNORMAL HIGH (ref 0.00–0.07)
Basophils Absolute: 0 10*3/uL (ref 0.0–0.1)
Basophils Relative: 0 %
Eosinophils Absolute: 0 10*3/uL (ref 0.0–0.5)
Eosinophils Relative: 0 %
HCT: 32.2 % — ABNORMAL LOW (ref 36.0–46.0)
Hemoglobin: 10.7 g/dL — ABNORMAL LOW (ref 12.0–15.0)
Immature Granulocytes: 1 %
Lymphocytes Relative: 29 %
Lymphs Abs: 2.5 10*3/uL (ref 0.7–4.0)
MCH: 30.4 pg (ref 26.0–34.0)
MCHC: 33.2 g/dL (ref 30.0–36.0)
MCV: 91.5 fL (ref 80.0–100.0)
Monocytes Absolute: 1.2 10*3/uL — ABNORMAL HIGH (ref 0.1–1.0)
Monocytes Relative: 14 %
Neutro Abs: 4.9 10*3/uL (ref 1.7–7.7)
Neutrophils Relative %: 56 %
Platelet Count: 148 10*3/uL — ABNORMAL LOW (ref 150–400)
RBC: 3.52 MIL/uL — ABNORMAL LOW (ref 3.87–5.11)
RDW: 15.9 % — ABNORMAL HIGH (ref 11.5–15.5)
WBC Count: 8.7 10*3/uL (ref 4.0–10.5)
nRBC: 0.2 % (ref 0.0–0.2)

## 2022-05-09 LAB — CMP (CANCER CENTER ONLY)
ALT: 13 U/L (ref 0–44)
AST: 12 U/L — ABNORMAL LOW (ref 15–41)
Albumin: 3.8 g/dL (ref 3.5–5.0)
Alkaline Phosphatase: 139 U/L — ABNORMAL HIGH (ref 38–126)
Anion gap: 8 (ref 5–15)
BUN: 19 mg/dL (ref 8–23)
CO2: 27 mmol/L (ref 22–32)
Calcium: 9.5 mg/dL (ref 8.9–10.3)
Chloride: 105 mmol/L (ref 98–111)
Creatinine: 0.82 mg/dL (ref 0.44–1.00)
GFR, Estimated: >60 mL/min (ref >60)
Glucose, Bld: 277 mg/dL — ABNORMAL HIGH (ref 70–99)
Potassium: 3.3 mmol/L — ABNORMAL LOW (ref 3.5–5.1)
Sodium: 140 mmol/L (ref 135–145)
Total Bilirubin: 0.3 mg/dL (ref 0.3–1.2)
Total Protein: 6.7 g/dL (ref 6.5–8.1)

## 2022-05-09 MED ORDER — BIOTENE DRY MOUTH MT LIQD: 15.0000 mL | OROMUCOSAL | 2 refills | Status: DC | PRN

## 2022-05-09 MED ORDER — SODIUM CHLORIDE 0.9 % IV SOLN
10.0000 mg | Freq: Once | INTRAVENOUS | Status: AC
  Administered 2022-05-09: 10 mg via INTRAVENOUS
  Filled 2022-05-09: qty 10

## 2022-05-09 MED ORDER — SODIUM CHLORIDE 0.9% FLUSH
10.0000 mL | Freq: Once | INTRAVENOUS | Status: AC
  Administered 2022-05-09: 10 mL

## 2022-05-09 MED ORDER — SODIUM CHLORIDE 0.9 % IV SOLN: Freq: Once | INTRAVENOUS | Status: AC

## 2022-05-09 MED ORDER — HEPARIN SOD (PORK) LOCK FLUSH 100 UNIT/ML IV SOLN
500.0000 [IU] | Freq: Once | INTRAVENOUS | Status: AC | PRN
  Administered 2022-05-09: 500 [IU]

## 2022-05-09 MED ORDER — SODIUM CHLORIDE 0.9 % IV SOLN
375.0000 mg/m2 | Freq: Once | INTRAVENOUS | Status: AC
  Administered 2022-05-09: 800 mg via INTRAVENOUS
  Filled 2022-05-09: qty 80

## 2022-05-09 MED ORDER — DOXORUBICIN HCL CHEMO IV INJECTION 2 MG/ML
25.0000 mg/m2 | Freq: Once | INTRAVENOUS | Status: AC
  Administered 2022-05-09: 52 mg via INTRAVENOUS
  Filled 2022-05-09: qty 26

## 2022-05-09 MED ORDER — SODIUM CHLORIDE 0.9% FLUSH
10.0000 mL | INTRAVENOUS | Status: DC | PRN
  Administered 2022-05-09: 10 mL

## 2022-05-09 MED ORDER — PALONOSETRON HCL INJECTION 0.25 MG/5ML
0.2500 mg | Freq: Once | INTRAVENOUS | Status: AC
  Administered 2022-05-09: 0.25 mg via INTRAVENOUS
  Filled 2022-05-09: qty 5

## 2022-05-09 MED ORDER — VINBLASTINE SULFATE CHEMO INJECTION 1 MG/ML
6.0000 mg/m2 | Freq: Once | INTRAVENOUS | Status: AC
  Administered 2022-05-09: 12.5 mg via INTRAVENOUS
  Filled 2022-05-09: qty 12.5

## 2022-05-09 NOTE — Patient Instructions (Signed)
[facility]  Discharge Instructions: Thank you for choosing [facility] to provide your oncology and hematology care.   If you have a lab appointment with the Cancer Center, please go directly to the Cancer Center and check in at the registration area.   Wear comfortable clothing and clothing appropriate for easy access to any Portacath or PICC line.   We strive to give you quality time with your provider. You may need to reschedule your appointment if you arrive late (15 or more minutes).  Arriving late affects you and other patients whose appointments are after yours.  Also, if you miss three or more appointments without notifying the office, you may be dismissed from the clinic at the provider's discretion.      For prescription refill requests, have your pharmacy contact our office and allow 72 hours for refills to be completed.    Today you received the following chemotherapy and/or immunotherapy agents Adriamycin, Velban, DTIC.      To help prevent nausea and vomiting after your treatment, we encourage you to take your nausea medication as directed.  BELOW ARE SYMPTOMS THAT SHOULD BE REPORTED IMMEDIATELY: *FEVER GREATER THAN 100.4 F (38 C) OR HIGHER *CHILLS OR SWEATING *NAUSEA AND VOMITING THAT IS NOT CONTROLLED WITH YOUR NAUSEA MEDICATION *UNUSUAL SHORTNESS OF BREATH *UNUSUAL BRUISING OR BLEEDING *URINARY PROBLEMS (pain or burning when urinating, or frequent urination) *BOWEL PROBLEMS (unusual diarrhea, constipation, pain near the anus) TENDERNESS IN MOUTH AND THROAT WITH OR WITHOUT PRESENCE OF ULCERS (sore throat, sores in mouth, or a toothache) UNUSUAL RASH, SWELLING OR PAIN  UNUSUAL VAGINAL DISCHARGE OR ITCHING   Items with * indicate a potential emergency and should be followed up as soon as possible or go to the Emergency Department if any problems should occur.  Please show the CHEMOTHERAPY ALERT CARD or IMMUNOTHERAPY  ALERT CARD at check-in to the Emergency Department and triage nurse.  Should you have questions after your visit or need to cancel or reschedule your appointment, please contact [facility]  Dept: [phone]  and follow the prompts.  Office hours are 8:00 a.m. to 4:30 p.m. Monday - Friday. Please note that voicemails left after 4:00 p.m. may not be returned until the following business day.  We are closed weekends and major holidays. You have access to a nurse at all times for urgent questions. Please call the main number to the clinic Dept: [phone] and follow the prompts.   For any non-urgent questions, you may also contact your provider using MyChart. We now offer e-Visits for anyone 18 and older to request care online for non-urgent symptoms. For details visit [email or web address].   Also download the MyChart app! Go to the app store, search "MyChart", open the app, select [facility], and log in with your MyChart username and password.   

## 2022-05-09 NOTE — Progress Notes (Unsigned)
[name] [phone]   [phone]  PROGRESS NOTE  Patient Care Team: Pcp, No as PCP - General [name], MD (Internal Medicine)  Hematological/Oncological History # Hodkgin's Lymphoma, Stage III 12/09/2021: CT Abdomen/Pelvis showed retroperitoneal adenopathy, with 2.1 x 3.6 cm node 12/20/2021: establish care with [facility]  12/27/2021: CT abdominal mass biopsy performed, findings consistent with classical Hodgkin's lymphoma. 01/05/2022: PET CT scan showed hypermetabolic abdominal adenopathy consistent with lymphoma.  She has mild hypermetabolism involving the mediastinal and bilateral axillary nodes. 01/17/2022: Cycle 1, Day 1 of AVD chemotherapy 01/31/2022: Cycle 1, Day 15 of AVD chemotherapy 04/24/2021: Cycle 2 Day 15 of AVD chemotherapy 05/09/2022: Cycle 3, Day 1 of AVD cheomtherapy  Interval History:  Heather Petty 72 y.o. female with medical history significant for newly diagnosed Hodgkin's lymphoma who presents for a follow up visit. The patient's last visit was on 04/25/2022. In the interim she continued on AVD chemotherapy.   On exam today Mrs. Danh reports her energy levels are fairly stable. She does have fatigue and still needs assistance with her ADLs. She is planning to start PT at her facility to improve her strength and mobility. She has a good appetite. She denies nausea, vomiting or abdominal pain. She reports occasional episodes of constipation. She has stool softeners that she takes as needed. She denies easy bruising or signs of bleeding. She reports stable shortness of breath with exertion only. She has dry mouth that is bothersome but does not interfere with PO intake. She denies fevers, chills, sweats, chest pain or cough. She has no other complaints.  A full 10 point ROS is otherwise negative.   MEDICAL HISTORY:  Past Medical History:  Diagnosis Date   AKI (acute kidney injury) ([place]) 12/17/2017   Allergic  rhinitis    Anemia    Arthritis    Asthma    Autonomic neuropathy    Depression    Diabetes mellitus    Diabetes mellitus without complication (HCC)    E coli infection    GERD (gastroesophageal reflux disease)    GERD (gastroesophageal reflux disease)    Gout    Hepatitis A    High cholesterol    Hypertension    Insomnia    Kidney failure    Metabolic encephalopathy    Morbid obesity (HCC)    NASH (nonalcoholic steatohepatitis)    Pneumonia 12/17/2017   Schizophrenia ([place])    Stroke ([place])    Vitamin B deficiency    Vitamin D deficiency     SURGICAL HISTORY: Past Surgical History:  Procedure Laterality Date   CYST EXCISION     buttucks   DENTAL SURGERY     IR IMAGING GUIDED PORT INSERTION  01/10/2022   IR IMAGING GUIDED PORT INSERTION  04/21/2022   IR REMOVAL TUN ACCESS W/ PORT W/O FL MOD SED  02/28/2022   TONSILLECTOMY      SOCIAL HISTORY: Social History   Socioeconomic History   Marital status: Unknown    Spouse name: Not on file   Number of children: 2   Years of education: Not on file   Highest education level: Not on file  Occupational History   Occupation: retired/disabled  Tobacco Use   Smoking status: Never   Smokeless tobacco: Never  Vaping Use   Vaping Use: Never used  Substance and Sexual Activity   Alcohol use: Never   Drug use: Never   Sexual activity: Not Currently    Birth control/protection: None  Other  Topics Concern   Not on file  Social History Narrative   ** Merged History Encounter **       Social Determinants of Health   Financial Resource Strain: Not on file  Food Insecurity: No Food Insecurity (02/26/2022)   Hunger Vital Sign    Worried About Running Out of Food in the Last Year: Never true    Ran Out of Food in the Last Year: Never true  Transportation Needs: No Transportation Needs (02/26/2022)   PRAPARE - Hydrologist (Medical): No    Lack of Transportation (Non-Medical): No  Physical  Activity: Not on file  Stress: Not on file  Social Connections: Not on file  Intimate Partner Violence: Not At Risk (02/26/2022)   Humiliation, Afraid, Rape, and Kick questionnaire    Fear of Current or Ex-Partner: No    Emotionally Abused: No    Physically Abused: No    Sexually Abused: No    FAMILY HISTORY: Family History  Adopted: Yes  Problem Relation Age of Onset   COPD Daughter     ALLERGIES:  is allergic to [place] [aprepitant], vancomycin, fosaprepitant, lasix [furosemide], lasix [furosemide], and pineapple.  MEDICATIONS:  Current Outpatient Medications  Medication Sig Dispense Refill   acetaminophen (TYLENOL) 500 MG tablet Take 1,000 mg by mouth 3 (three) times daily as needed for moderate pain.     albuterol (VENTOLIN HFA) 108 (90 Base) MCG/ACT inhaler Inhale 2 puffs into the lungs every 4 (four) hours as needed for wheezing or shortness of breath.     allopurinol (ZYLOPRIM) 100 MG tablet Take 100 mg by mouth daily.     amLODipine (NORVASC) 10 MG tablet Take 10 mg by mouth daily.     calamine lotion Apply 1 Application topically 2 (two) times daily.  0   carvedilol (COREG) 6.25 MG tablet Take 6.25 mg by mouth 2 (two) times daily with a meal.     cholecalciferol (VITAMIN D3) 25 MCG (1000 UNIT) tablet Take 2,000 Units by mouth daily.     doxepin (SINEQUAN) 25 MG capsule Take 25 mg by mouth at bedtime.     famotidine (PEPCID) 20 MG tablet Take 20 mg by mouth 2 (two) times daily.     fluticasone (FLONASE) 50 MCG/ACT nasal spray Place 1 spray into both nostrils daily.     glipiZIDE (GLUCOTROL XL) 10 MG 24 hr tablet Take 10 mg by mouth in the morning and at bedtime.     guaiFENesin (MUCINEX) 600 MG 12 hr tablet Take 600 mg by mouth every 12 (twelve) hours.     hydrOXYzine (ATARAX/VISTARIL) 25 MG tablet Take 25 mg by mouth in the morning and at bedtime.     insulin aspart (NOVOLOG) 100 UNIT/ML injection Inject 0-12 Units into the skin See admin instructions. Inject 0-12 units into  the skin 4 times a day (before meals and at bedtime) per sliding scale: BGL <60 or >400  = CALL MD; 60-200 = 0 units; 201-250 = 2 units; 251-300 = 4 units; 301-350 = 6 units; 351-400 = 8 units; 401-450 = 10 units; >450 = 12 units     insulin glargine (LANTUS) 100 UNIT/ML injection Inject 28 Units into the skin at bedtime.     insulin glargine (LANTUS) 100 UNIT/ML injection Inject 40 Units into the skin daily.     ipratropium-albuterol (DUONEB) 0.5-2.5 (3) MG/3ML SOLN Take 3 mLs by nebulization.     ipratropium-albuterol (DUONEB) 0.5-2.5 (3) MG/3ML SOLN Take 3 mLs  by nebulization 3 (three) times daily.     lactose free nutrition (BOOST) LIQD Take 237 mLs by mouth 2 (two) times daily between meals.     LANTUS SOLOSTAR 100 UNIT/ML Solostar Pen Inject 28-38 Units into the skin 2 (two) times daily. Inject 38 units in the morning and Inject 28 units at bedtime     lidocaine 4 % Place 1 patch onto the skin daily.     lidocaine-prilocaine (EMLA) cream Apply 1 Application topically as needed. 30 g 0   lip balm (CARMEX) ointment Apply 1 Application topically as needed for lip care (cold sores). 7 g 0   lisinopril (ZESTRIL) 5 MG tablet Take 5 mg by mouth daily.     loperamide (IMODIUM) 2 MG capsule Take 1 capsule (2 mg total) by mouth as needed for diarrhea or loose stools. 30 capsule 0   loratadine (CLARITIN) 10 MG tablet Take 10 mg by mouth daily.     miconazole nitrate (MICATIN) POWD Apply 1 Application topically 2 (two) times daily.  0   Mouthwashes (MOUTH RINSE) LIQD solution 15 mLs by Mouth Rinse route as needed (for oral care).  0   mupirocin ointment (BACTROBAN) 2 % Apply 1 Application topically 2 (two) times daily.     Nystatin (GERHARDT'S BUTT CREAM) CREA Apply 1 Application topically 4 (four) times daily.     nystatin (MYCOSTATIN) 100000 UNIT/ML suspension Take 5 mLs (500,000 Units total) by mouth 4 (four) times daily. 60 mL 0   Pollen Extracts (PROSTAT PO) Take 30 mLs by mouth in the morning and  at bedtime.     Polyethyl Glycol-Propyl Glycol (SYSTANE ULTRA) 0.4-0.3 % SOLN Place 2 drops into both eyes in the morning and at bedtime.     prochlorperazine (COMPAZINE) 10 MG tablet Take 1 tablet (10 mg total) by mouth every 6 (six) hours as needed for nausea or vomiting. 30 tablet 0   Propylene Glycol-Glycerin 0.6-0.6 % SOLN Apply 0.6 % to eye 2 (two) times daily. Place two drops into both eyes twice a day.  Wait three minutes before administering another eye med     Simethicone (GAS-X ULTRA STRENGTH) 180 MG CAPS Take 1 capsule by mouth every 8 (eight) hours as needed (gas).     Skin Protectants, Misc. (EUCERIN) cream Apply 1 Application topically 2 (two) times daily. Where skin is peeling     traMADol (ULTRAM) 50 MG tablet Take 1 tablet (50 mg total) by mouth every 6 (six) hours as needed for moderate pain. 30 tablet 0   triamcinolone ointment (KENALOG) 0.5 % Apply 1 Application topically 2 (two) times daily.     zinc oxide 20 % ointment Apply 1 Application topically See admin instructions. Every shift and as needed (bilateral buttock)     No current facility-administered medications for this visit.   Facility-Administered Medications Ordered in Other Visits  Medication Dose Route Frequency Provider Last Rate Last Admin   0.9 %  sodium chloride infusion   Intravenous Continuous Allred, Darrell K, PA-C        REVIEW OF SYSTEMS:   Constitutional: ( - ) fevers, ( - )  chills , ( - ) night sweats Eyes: ( - ) blurriness of vision, ( - ) double vision, ( - ) watery eyes Ears, nose, mouth, throat, and face: ( - ) mucositis, ( - ) sore throat Respiratory: ( - ) cough, ( + ) dyspnea, ( - ) wheezes Cardiovascular: ( - ) palpitation, ( - ) chest discomfort, ( - )  lower extremity swelling Gastrointestinal:  ( +) nausea, ( - ) heartburn, ( - ) change in bowel habits Skin: ( - ) abnormal skin rashes Lymphatics: ( - ) new lymphadenopathy, ( - ) easy bruising Neurological: ( - ) numbness, ( - )  tingling, ( - ) new weaknesses Behavioral/Psych: ( - ) mood change, ( - ) new changes  All other systems were reviewed with the patient and are negative.  PHYSICAL EXAMINATION: ECOG PERFORMANCE STATUS: 2 - Symptomatic, <50% confined to bed  Vitals:   05/09/22 1325  BP: (!) 128/59  Pulse: 78  Resp: 18  Temp: (!) 97.5 F (36.4 C)  SpO2: 99%   Filed Weights    GENERAL: Well-appearing elderly female, alert, no distress and comfortable SKIN: skin color, texture, turgor are normal, no rashes or significant lesions EYES: conjunctiva are pink and non-injected, sclera clear LUNGS: clear to auscultation and percussion with normal breathing effort HEART: regular rate & rhythm and no murmurs and no lower extremity edema Musculoskeletal: no cyanosis of digits and no clubbing  PSYCH: alert & oriented x 3, fluent speech NEURO: no focal motor/sensory deficits  LABORATORY DATA:  I have reviewed the data as listed    Latest Ref Rng & Units 05/09/2022   12:33 PM 04/25/2022   12:36 PM 03/14/2022   10:08 AM  CBC  WBC 4.0 - 10.5 K/uL 8.7  9.4  10.8   Hemoglobin 12.0 - 15.0 g/dL 10.7  12.0  10.1   Hematocrit 36.0 - 46.0 % 32.2  36.1  32.1   Platelets 150 - 400 K/uL 148  143  209        Latest Ref Rng & Units 05/09/2022   12:33 PM 04/25/2022   12:36 PM 03/14/2022   10:08 AM  CMP  Glucose 70 - 99 mg/dL 277  149  169   BUN 8 - 23 mg/dL 19  19  17    Creatinine 0.44 - 1.00 mg/dL 0.82  0.73  0.77   Sodium 135 - 145 mmol/L 140  138  138   Potassium 3.5 - 5.1 mmol/L 3.3  3.7  4.2   Chloride 98 - 111 mmol/L 105  105  104   CO2 22 - 32 mmol/L 27  26  25    Calcium 8.9 - 10.3 mg/dL 9.5  9.4  9.2   Total Protein 6.5 - 8.1 g/dL 6.7  6.8  6.4   Total Bilirubin 0.3 - 1.2 mg/dL 0.3  0.3  0.6   Alkaline Phos 38 - 126 U/L 139  138  295   AST 15 - 41 U/L 12  15  20    ALT 0 - 44 U/L 13  14  23      RADIOGRAPHIC STUDIES: IR IMAGING GUIDED PORT INSERTION  Result Date: 04/21/2022 INDICATION: 72 year old  female with a history of lymphoma referred for port catheter EXAM: IMAGE GUIDED PORT CATHETER MEDICATIONS: None ANESTHESIA/SEDATION: Moderate (conscious) sedation was employed during this procedure. A total of Versed 2.0 mg and Fentanyl 100 mcg was administered intravenously. Moderate Sedation Time: 23 minutes. The patient's level of consciousness and vital signs were monitored continuously by radiology nursing throughout the procedure under my direct supervision. FLUOROSCOPY TIME:  Fluoroscopy Time: 0 minutes 12 seconds (0 mGy). COMPLICATIONS: None PROCEDURE: Informed written consent was obtained from the patient after a thorough discussion of the procedural risks, benefits and alternatives. All questions were addressed. Maximal Sterile Barrier Technique was utilized including caps, mask, sterile gowns, sterile gloves, sterile drape,  hand hygiene and skin antiseptic. A timeout was performed prior to the initiation of the procedure. Ultrasound survey was performed with images stored and sent to PACs. Right IJ vein documented to be patent. The right neck and chest was prepped with chlorhexidine, and draped in the usual sterile fashion using maximum barrier technique (cap and mask, sterile gown, sterile gloves, large sterile sheet, hand hygiene and cutaneous antiseptic). Local anesthesia was attained by infiltration with 1% lidocaine without epinephrine. Ultrasound demonstrated patency of the right internal jugular vein, and this was documented with an image. Under real-time ultrasound guidance, this vein was accessed with a 21 gauge micropuncture needle and image documentation was performed. A small dermatotomy was made at the access site with an 11 scalpel. A 0.018" wire was advanced into the SVC and used to estimate the length of the internal catheter. The access needle exchanged for a 18F micropuncture vascular sheath. The 0.018" wire was then removed and a 0.035" wire advanced into the IVC. An appropriate location  for the subcutaneous reservoir was selected below the clavicle and an incision was made through the skin and underlying soft tissues. This incision site was between the clavicle and the former port catheter position. The subcutaneous tissues were then dissected using a combination of blunt and sharp surgical technique and a pocket was formed. A single lumen power injectable portacatheter was then tunneled through the subcutaneous tissues from the pocket to the dermatotomy and the port reservoir placed within the subcutaneous pocket. A Prolene suture was placed at both the right and left apex of the port, given the fact that the port was placed adjacent to a previous port catheter pocket. The venous access site was then serially dilated and a peel away vascular sheath placed over the wire. The wire was removed and the port catheter advanced into position under fluoroscopic guidance. The catheter tip is positioned in the cavoatrial junction. This was documented with a spot image. The portacatheter was then tested and found to flush and aspirate well. The port was flushed with saline followed by 100 units/mL heparinized saline. The pocket was then closed in two layers using first subdermal inverted interrupted absorbable sutures followed by a running subcuticular suture. The epidermis was then sealed with Dermabond. The dermatotomy at the venous access site was also seal with Dermabond. Patient tolerated the procedure well and remained hemodynamically stable throughout. No complications encountered and no significant blood loss encountered IMPRESSION: Status post image guided right IJ port catheter. Signed, Dulcy Fanny. Nadene Rubins, RPVI [facility] Electronically Signed   By: Corrie Mckusick D.O.   On: 04/21/2022 15:21    ASSESSMENT & PLAN Heather Petty 72 y.o. female with medical history significant for newly diagnosed Hodgkin's lymphoma who presents for  a follow up visit.   #Hodgkin's Lymphoma, Stage III --biopsy of abdominal lymph node on 12/27/2021 showed classical Hodgkin's lymphoma.  --Pre-treatment PET scan from [place] [facility] abdominal adenopathy along with mild hypermetabolism involving mediastinal and bilateral axillary nodes. --Recommend AVD chemotherapy x 2 cycles followed by PET CT scan.  PLAN: --today patient is due for Cycle 3, Day 1 of AVD chemotherapy.  --Labs from today show white blood cell 8.7, Hgb 10.7, MCV 91.5, Plt 148. Creatinine normal. --AVD chemotherapy typically given without GCSF support but given her neutropenic fever she will require it moving forward.  --Proceed with treatment today without any dose modifications.  --RTC for Cycle 3 Day 15 of AVD chemotherapy.   #Sigmoid  colonic focal hypermetabolism #Hepatic steatosis with possible cirrhosis --Will request follow up with GI to further evaluate.  #Supportive Care -- chemotherapy education complete -- port placement complete --baseline echo from 01/06/2022 showed EF 60-65% -- not planning for bleomycin, no need for PFTs.  --  compazine 10mg  PO q6H for nausea (zofran contraindicated due to QT prolongated medications).  -- allopurinol 300mg  PO daily for TLS prophylaxis -- EMLA cream for port -- no pain medication required at this time.    No orders of the defined types were placed in this encounter.   All questions were answered. The patient knows to call the clinic with any problems, questions or concerns.  I have spent a total of 30 minutes minutes of face-to-face and non-face-to-face time, preparing to see the patient, performing a medically appropriate examination, counseling and educating the patient, ordering tests/procedures, communicating with other health care professionals, documenting clinical information in the electronic health record,  and care coordination.   Dede Query PA-C Dept of Hematology and [place] at  [facility] Phone: [phone]     05/09/2022 1:42 PM

## 2022-05-09 NOTE — Progress Notes (Signed)
[facility] Spiritual Care Note  Visited Ms Gertz in infusion. She reports good support and encouragement from her friends and no particular needs at this time, but is aware of ongoing Spiritual Care availability should needs arise or circumstances change.   [place], [place], [place] Pager [phone] Voicemail [phone]

## 2022-05-11 ENCOUNTER — Inpatient Hospital Stay: Payer: Medicare Other

## 2022-05-11 ENCOUNTER — Encounter: Payer: Self-pay | Admitting: Hematology and Oncology

## 2022-05-11 ENCOUNTER — Other Ambulatory Visit: Payer: Self-pay

## 2022-05-11 ENCOUNTER — Encounter: Payer: Self-pay | Admitting: Physician Assistant

## 2022-05-11 VITALS — BP 129/58 | HR 76 | Temp 98.5°F | Resp 18

## 2022-05-11 DIAGNOSIS — C8198 Hodgkin lymphoma, unspecified, lymph nodes of multiple sites: Secondary | ICD-10-CM

## 2022-05-11 DIAGNOSIS — Z5111 Encounter for antineoplastic chemotherapy: Secondary | ICD-10-CM | POA: Diagnosis not present

## 2022-05-11 MED ORDER — PEGFILGRASTIM-CBQV 6 MG/0.6ML ~~LOC~~ SOSY
6.0000 mg | PREFILLED_SYRINGE | Freq: Once | SUBCUTANEOUS | Status: AC
  Administered 2022-05-11: 6 mg via SUBCUTANEOUS
  Filled 2022-05-11: qty 0.6

## 2022-05-11 NOTE — Patient Instructions (Signed)

## 2022-05-12 ENCOUNTER — Other Ambulatory Visit: Payer: Self-pay

## 2022-05-18 ENCOUNTER — Inpatient Hospital Stay (HOSPITAL_COMMUNITY)
Admission: EM | Admit: 2022-05-18 | Discharge: 2022-05-24 | DRG: 809 | Disposition: A | Discharge status: Skilled Nursing Facility | Payer: Medicare Other | Source: Skilled Nursing Facility | Attending: Internal Medicine | Admitting: Internal Medicine

## 2022-05-18 ENCOUNTER — Other Ambulatory Visit: Payer: Self-pay

## 2022-05-18 ENCOUNTER — Emergency Department (HOSPITAL_COMMUNITY): Payer: Medicare Other

## 2022-05-18 ENCOUNTER — Encounter (HOSPITAL_COMMUNITY): Payer: Self-pay | Admitting: Emergency Medicine

## 2022-05-18 DIAGNOSIS — I10 Essential (primary) hypertension: Secondary | ICD-10-CM | POA: Diagnosis present

## 2022-05-18 DIAGNOSIS — J45909 Unspecified asthma, uncomplicated: Secondary | ICD-10-CM | POA: Diagnosis present

## 2022-05-18 DIAGNOSIS — N179 Acute kidney failure, unspecified: Secondary | ICD-10-CM | POA: Diagnosis not present

## 2022-05-18 DIAGNOSIS — Z794 Long term (current) use of insulin: Secondary | ICD-10-CM

## 2022-05-18 DIAGNOSIS — C8170 Other classical Hodgkin lymphoma, unspecified site: Secondary | ICD-10-CM | POA: Diagnosis present

## 2022-05-18 DIAGNOSIS — Z8673 Personal history of transient ischemic attack (TIA), and cerebral infarction without residual deficits: Secondary | ICD-10-CM

## 2022-05-18 DIAGNOSIS — Z993 Dependence on wheelchair: Secondary | ICD-10-CM

## 2022-05-18 DIAGNOSIS — Z7984 Long term (current) use of oral hypoglycemic drugs: Secondary | ICD-10-CM

## 2022-05-18 DIAGNOSIS — E871 Hypo-osmolality and hyponatremia: Secondary | ICD-10-CM | POA: Diagnosis present

## 2022-05-18 DIAGNOSIS — E1165 Type 2 diabetes mellitus with hyperglycemia: Secondary | ICD-10-CM

## 2022-05-18 DIAGNOSIS — R5081 Fever presenting with conditions classified elsewhere: Secondary | ICD-10-CM | POA: Diagnosis present

## 2022-05-18 DIAGNOSIS — Z79899 Other long term (current) drug therapy: Secondary | ICD-10-CM

## 2022-05-18 DIAGNOSIS — L988 Other specified disorders of the skin and subcutaneous tissue: Secondary | ICD-10-CM

## 2022-05-18 DIAGNOSIS — Z825 Family history of asthma and other chronic lower respiratory diseases: Secondary | ICD-10-CM

## 2022-05-18 DIAGNOSIS — M109 Gout, unspecified: Secondary | ICD-10-CM | POA: Diagnosis present

## 2022-05-18 DIAGNOSIS — D709 Neutropenia, unspecified: Secondary | ICD-10-CM | POA: Diagnosis not present

## 2022-05-18 DIAGNOSIS — F209 Schizophrenia, unspecified: Secondary | ICD-10-CM | POA: Diagnosis present

## 2022-05-18 DIAGNOSIS — C8190 Hodgkin lymphoma, unspecified, unspecified site: Secondary | ICD-10-CM | POA: Diagnosis not present

## 2022-05-18 DIAGNOSIS — R21 Rash and other nonspecific skin eruption: Secondary | ICD-10-CM | POA: Diagnosis not present

## 2022-05-18 DIAGNOSIS — E669 Obesity, unspecified: Secondary | ICD-10-CM | POA: Diagnosis present

## 2022-05-18 DIAGNOSIS — E876 Hypokalemia: Secondary | ICD-10-CM | POA: Diagnosis present

## 2022-05-18 DIAGNOSIS — F32A Depression, unspecified: Secondary | ICD-10-CM | POA: Diagnosis present

## 2022-05-18 DIAGNOSIS — K219 Gastro-esophageal reflux disease without esophagitis: Secondary | ICD-10-CM | POA: Diagnosis present

## 2022-05-18 DIAGNOSIS — E1169 Type 2 diabetes mellitus with other specified complication: Secondary | ICD-10-CM | POA: Diagnosis present

## 2022-05-18 DIAGNOSIS — N289 Disorder of kidney and ureter, unspecified: Secondary | ICD-10-CM | POA: Diagnosis present

## 2022-05-18 DIAGNOSIS — C8198 Hodgkin lymphoma, unspecified, lymph nodes of multiple sites: Secondary | ICD-10-CM | POA: Diagnosis not present

## 2022-05-18 DIAGNOSIS — D61818 Other pancytopenia: Secondary | ICD-10-CM | POA: Diagnosis present

## 2022-05-18 DIAGNOSIS — T451X5A Adverse effect of antineoplastic and immunosuppressive drugs, initial encounter: Secondary | ICD-10-CM | POA: Diagnosis present

## 2022-05-18 DIAGNOSIS — I959 Hypotension, unspecified: Secondary | ICD-10-CM | POA: Diagnosis present

## 2022-05-18 DIAGNOSIS — Z95828 Presence of other vascular implants and grafts: Secondary | ICD-10-CM

## 2022-05-18 DIAGNOSIS — E119 Type 2 diabetes mellitus without complications: Secondary | ICD-10-CM

## 2022-05-18 DIAGNOSIS — Z1152 Encounter for screening for COVID-19: Secondary | ICD-10-CM | POA: Diagnosis not present

## 2022-05-18 DIAGNOSIS — E7800 Pure hypercholesterolemia, unspecified: Secondary | ICD-10-CM | POA: Diagnosis present

## 2022-05-18 DIAGNOSIS — Z91018 Allergy to other foods: Secondary | ICD-10-CM

## 2022-05-18 DIAGNOSIS — Z6836 Body mass index (BMI) 36.0-36.9, adult: Secondary | ICD-10-CM

## 2022-05-18 DIAGNOSIS — Z881 Allergy status to other antibiotic agents status: Secondary | ICD-10-CM

## 2022-05-18 DIAGNOSIS — K7581 Nonalcoholic steatohepatitis (NASH): Secondary | ICD-10-CM | POA: Diagnosis present

## 2022-05-18 DIAGNOSIS — Z888 Allergy status to other drugs, medicaments and biological substances status: Secondary | ICD-10-CM

## 2022-05-18 LAB — CBC WITH DIFFERENTIAL/PLATELET
Abs Immature Granulocytes: 0.01 10*3/uL (ref 0.00–0.07)
Basophils Absolute: 0 10*3/uL (ref 0.0–0.1)
Basophils Relative: 2 %
Eosinophils Absolute: 0 10*3/uL (ref 0.0–0.5)
Eosinophils Relative: 2 %
HCT: 39.6 % (ref 36.0–46.0)
Hemoglobin: 13.2 g/dL (ref 12.0–15.0)
Immature Granulocytes: 1 %
Lymphocytes Relative: 55 %
Lymphs Abs: 0.7 10*3/uL (ref 0.7–4.0)
MCH: 29.8 pg (ref 26.0–34.0)
MCHC: 33.3 g/dL (ref 30.0–36.0)
MCV: 89.4 fL (ref 80.0–100.0)
Monocytes Absolute: 0.3 10*3/uL (ref 0.1–1.0)
Monocytes Relative: 22 %
Neutro Abs: 0.2 10*3/uL — CL (ref 1.7–7.7)
Neutrophils Relative %: 18 %
Platelets: 65 10*3/uL — ABNORMAL LOW (ref 150–400)
RBC: 4.43 MIL/uL (ref 3.87–5.11)
RDW: 15.3 % (ref 11.5–15.5)
WBC: 1.3 10*3/uL — CL (ref 4.0–10.5)
nRBC: 0 % (ref 0.0–0.2)

## 2022-05-18 LAB — RESP PANEL BY RT-PCR (RSV, FLU A&B, COVID)  RVPGX2
Influenza A by PCR: NEGATIVE
Influenza B by PCR: NEGATIVE
Resp Syncytial Virus by PCR: NEGATIVE
SARS Coronavirus 2 by RT PCR: NEGATIVE

## 2022-05-18 LAB — URINALYSIS, W/ REFLEX TO CULTURE (INFECTION SUSPECTED)
Bacteria, UA: NONE SEEN
Bilirubin Urine: NEGATIVE
Glucose, UA: NEGATIVE mg/dL
Hgb urine dipstick: NEGATIVE
Ketones, ur: NEGATIVE mg/dL
Leukocytes,Ua: NEGATIVE
Nitrite: NEGATIVE
Protein, ur: 30 mg/dL — AB
Specific Gravity, Urine: 1.025 (ref 1.005–1.030)
pH: 5 (ref 5.0–8.0)

## 2022-05-18 LAB — COMPREHENSIVE METABOLIC PANEL
ALT: 30 U/L (ref 0–44)
AST: 19 U/L (ref 15–41)
Albumin: 3.2 g/dL — ABNORMAL LOW (ref 3.5–5.0)
Alkaline Phosphatase: 107 U/L (ref 38–126)
Anion gap: 10 (ref 5–15)
BUN: 20 mg/dL (ref 8–23)
CO2: 25 mmol/L (ref 22–32)
Calcium: 8.5 mg/dL — ABNORMAL LOW (ref 8.9–10.3)
Chloride: 97 mmol/L — ABNORMAL LOW (ref 98–111)
Creatinine, Ser: 1 mg/dL (ref 0.44–1.00)
GFR, Estimated: 59 mL/min — ABNORMAL LOW (ref >60)
Glucose, Bld: 245 mg/dL — ABNORMAL HIGH (ref 70–99)
Potassium: 3.2 mmol/L — ABNORMAL LOW (ref 3.5–5.1)
Sodium: 132 mmol/L — ABNORMAL LOW (ref 135–145)
Total Bilirubin: 1 mg/dL (ref 0.3–1.2)
Total Protein: 6.3 g/dL — ABNORMAL LOW (ref 6.5–8.1)

## 2022-05-18 LAB — GLUCOSE, CAPILLARY
Glucose-Capillary: 157 mg/dL — ABNORMAL HIGH (ref 70–99)
Glucose-Capillary: 165 mg/dL — ABNORMAL HIGH (ref 70–99)

## 2022-05-18 LAB — APTT: aPTT: 37 seconds — ABNORMAL HIGH (ref 24–36)

## 2022-05-18 LAB — PROTIME-INR
INR: 1.2 (ref 0.8–1.2)
Prothrombin Time: 14.9 seconds (ref 11.4–15.2)

## 2022-05-18 LAB — CBG MONITORING, ED: Glucose-Capillary: 186 mg/dL — ABNORMAL HIGH (ref 70–99)

## 2022-05-18 LAB — LACTIC ACID, PLASMA: Lactic Acid, Venous: 1.4 mmol/L (ref 0.5–1.9)

## 2022-05-18 MED ORDER — LEVALBUTEROL HCL 0.63 MG/3ML IN NEBU: 0.6300 mg | INHALATION_SOLUTION | Freq: Four times a day (QID) | RESPIRATORY_TRACT | Status: DC

## 2022-05-18 MED ORDER — SODIUM CHLORIDE 0.9 % IV SOLN
2.0000 g | Freq: Two times a day (BID) | INTRAVENOUS | Status: DC
  Administered 2022-05-19 – 2022-05-21 (×5): 2 g via INTRAVENOUS
  Filled 2022-05-18 (×5): qty 12.5

## 2022-05-18 MED ORDER — VANCOMYCIN HCL 750 MG/150ML IV SOLN
750.0000 mg | INTRAVENOUS | Status: DC
  Administered 2022-05-19 – 2022-05-20 (×2): 750 mg via INTRAVENOUS
  Filled 2022-05-18 (×2): qty 150

## 2022-05-18 MED ORDER — DOCUSATE SODIUM 100 MG PO CAPS
100.0000 mg | ORAL_CAPSULE | Freq: Two times a day (BID) | ORAL | Status: DC
  Administered 2022-05-19: 100 mg via ORAL
  Filled 2022-05-18 (×10): qty 1

## 2022-05-18 MED ORDER — INSULIN ASPART 100 UNIT/ML IJ SOLN
0.0000 [IU] | Freq: Three times a day (TID) | INTRAMUSCULAR | Status: DC
  Administered 2022-05-18 – 2022-05-19 (×2): 2 [IU] via SUBCUTANEOUS
  Administered 2022-05-19 – 2022-05-20 (×3): 3 [IU] via SUBCUTANEOUS
  Administered 2022-05-20: 8 [IU] via SUBCUTANEOUS
  Administered 2022-05-21 (×2): 5 [IU] via SUBCUTANEOUS
  Administered 2022-05-21: 3 [IU] via SUBCUTANEOUS
  Administered 2022-05-22 – 2022-05-23 (×5): 5 [IU] via SUBCUTANEOUS
  Administered 2022-05-23: 11 [IU] via SUBCUTANEOUS
  Administered 2022-05-24 (×2): 5 [IU] via SUBCUTANEOUS
  Filled 2022-05-18: qty 0.15

## 2022-05-18 MED ORDER — PROMETHAZINE HCL 25 MG PO TABS: 12.5000 mg | ORAL_TABLET | Freq: Four times a day (QID) | ORAL | Status: DC | PRN

## 2022-05-18 MED ORDER — MORPHINE SULFATE (PF) 4 MG/ML IV SOLN
4.0000 mg | Freq: Once | INTRAVENOUS | Status: AC
  Administered 2022-05-18: 4 mg via INTRAVENOUS
  Filled 2022-05-18: qty 1

## 2022-05-18 MED ORDER — ALLOPURINOL 100 MG PO TABS
100.0000 mg | ORAL_TABLET | Freq: Every day | ORAL | Status: DC
  Administered 2022-05-19 – 2022-05-24 (×6): 100 mg via ORAL
  Filled 2022-05-18 (×8): qty 1

## 2022-05-18 MED ORDER — IPRATROPIUM-ALBUTEROL 0.5-2.5 (3) MG/3ML IN SOLN
3.0000 mL | Freq: Once | RESPIRATORY_TRACT | Status: AC
  Administered 2022-05-18: 3 mL via RESPIRATORY_TRACT
  Filled 2022-05-18: qty 3

## 2022-05-18 MED ORDER — SODIUM CHLORIDE 0.9 % IV BOLUS
1000.0000 mL | Freq: Once | INTRAVENOUS | Status: AC
  Administered 2022-05-18: 1000 mL via INTRAVENOUS

## 2022-05-18 MED ORDER — ALBUTEROL SULFATE (2.5 MG/3ML) 0.083% IN NEBU: 2.5000 mg | INHALATION_SOLUTION | RESPIRATORY_TRACT | Status: DC | PRN

## 2022-05-18 MED ORDER — VANCOMYCIN HCL 2000 MG/400ML IV SOLN
2000.0000 mg | Freq: Once | INTRAVENOUS | Status: AC
  Administered 2022-05-18: 2000 mg via INTRAVENOUS
  Filled 2022-05-18: qty 400

## 2022-05-18 MED ORDER — ACETAMINOPHEN 650 MG RE SUPP: 650.0000 mg | Freq: Four times a day (QID) | RECTAL | Status: DC | PRN

## 2022-05-18 MED ORDER — ACETAMINOPHEN 325 MG PO TABS
650.0000 mg | ORAL_TABLET | Freq: Four times a day (QID) | ORAL | Status: DC | PRN
  Administered 2022-05-18 – 2022-05-20 (×4): 650 mg via ORAL
  Filled 2022-05-18 (×4): qty 2

## 2022-05-18 MED ORDER — SODIUM CHLORIDE 0.9 % IV SOLN
2.0000 g | Freq: Once | INTRAVENOUS | Status: AC
  Administered 2022-05-18: 2 g via INTRAVENOUS
  Filled 2022-05-18: qty 12.5

## 2022-05-18 MED ORDER — POLYETHYLENE GLYCOL 3350 17 G PO PACK: 17.0000 g | PACK | Freq: Every day | ORAL | Status: DC | PRN

## 2022-05-18 MED ORDER — POTASSIUM CHLORIDE CRYS ER 20 MEQ PO TBCR
40.0000 meq | EXTENDED_RELEASE_TABLET | Freq: Once | ORAL | Status: AC
  Administered 2022-05-18: 40 meq via ORAL
  Filled 2022-05-18: qty 2

## 2022-05-18 MED ORDER — ENOXAPARIN SODIUM 40 MG/0.4ML IJ SOSY
40.0000 mg | PREFILLED_SYRINGE | INTRAMUSCULAR | Status: DC
  Administered 2022-05-18 – 2022-05-23 (×5): 40 mg via SUBCUTANEOUS
  Filled 2022-05-18 (×6): qty 0.4

## 2022-05-18 MED ORDER — TRAZODONE HCL 50 MG PO TABS
25.0000 mg | ORAL_TABLET | Freq: Every evening | ORAL | Status: DC | PRN
  Administered 2022-05-19: 25 mg via ORAL
  Filled 2022-05-18: qty 1

## 2022-05-18 MED ORDER — INSULIN ASPART 100 UNIT/ML IJ SOLN
0.0000 [IU] | Freq: Every day | INTRAMUSCULAR | Status: DC
  Administered 2022-05-20: 2 [IU] via SUBCUTANEOUS
  Administered 2022-05-21: 3 [IU] via SUBCUTANEOUS
  Administered 2022-05-22: 2 [IU] via SUBCUTANEOUS
  Filled 2022-05-18: qty 0.05

## 2022-05-18 MED ORDER — ONDANSETRON HCL 4 MG/2ML IJ SOLN
4.0000 mg | Freq: Once | INTRAMUSCULAR | Status: AC
  Administered 2022-05-18: 4 mg via INTRAVENOUS
  Filled 2022-05-18: qty 2

## 2022-05-18 MED ORDER — LEVALBUTEROL HCL 0.63 MG/3ML IN NEBU: 0.6300 mg | INHALATION_SOLUTION | Freq: Three times a day (TID) | RESPIRATORY_TRACT | Status: DC | PRN

## 2022-05-18 NOTE — Consult Note (Signed)
[facility]       Reason for Consult:fever    Referring Physician: Dr. Kirby Crigler  Principal Problem:   Febrile neutropenia Active Problems:   HTN (hypertension)   Hyperlipidemia associated with type 2 diabetes mellitus   Hypotension   GERD (gastroesophageal reflux disease)   DM II (diabetes mellitus, type II), controlled   Schizophrenia   Essential hypertension   Hodgkin lymphoma   Port-A-Cath in place   Hyponatremia    allopurinol  100 mg Oral Daily   docusate sodium  100 mg Oral BID   enoxaparin (LOVENOX) injection  40 mg Subcutaneous Q24H   insulin aspart  0-15 Units Subcutaneous TID WC   insulin aspart  0-5 Units Subcutaneous QHS    Recommendations: Agree with current antibiotics Blood cultures Will consider port removal again if needed based on findings  Assessment: Neutropenic fever with concern for port related infection with some erythema around the top of the port, though no purulence and appears dry.    HPI: Heather Petty is a 72 y.o. female with a history of Hodgkin's lymphoma currently getting chemotherapy, last infusion 4/2 and coming in with fever of 101 at home.  Neutropenic with an ANC of 200.  Previous suspected port infection in the setting of neutropenia with port removal with negative cultures and treated with vancomycin and ceftriaxone for 2 weeks.  Port replaced on 04/21/22.  Currently with chills and complains of itch.   Review of Systems:  Constitutional: positive for fevers and chills All other systems reviewed and are negative    Past Medical History:  Diagnosis Date   AKI (acute kidney injury) 12/17/2017   Allergic rhinitis    Anemia    Arthritis    Asthma    Autonomic neuropathy    Depression    Diabetes mellitus    Diabetes mellitus without complication    E coli infection    GERD (gastroesophageal reflux disease)    GERD (gastroesophageal reflux disease)    Gout    Hepatitis A    High  cholesterol    Hypertension    Insomnia    Kidney failure    Metabolic encephalopathy    Morbid obesity    NASH (nonalcoholic steatohepatitis)    Pneumonia 12/17/2017   Schizophrenia    Stroke    Vitamin B deficiency    Vitamin D deficiency     Social History   Tobacco Use   Smoking status: Never   Smokeless tobacco: Never  Vaping Use   Vaping Use: Never used  Substance Use Topics   Alcohol use: Never   Drug use: Never    Family History  Adopted: Yes  Problem Relation Age of Onset   COPD Daughter     Allergies  Allergen Reactions   Emend [Aprepitant] Other (See Comments)    Pt experienced shooting back pain at 10/10 when medication was administered the first time. See Hypersensitivity note from 01/17/2022.   Vancomycin Other (See Comments)    Patient becomes hypotensive, lethargic and itchy. Has tolerated it many times.   Fosaprepitant Other (See Comments)    Back pain / hypersensitivity 01/17/2022   Lasix [Furosemide] Other (See Comments)    IV-LASIX ==> Reaction: Burning   Lasix [Furosemide] Other (See Comments)    On MAR   Pineapple Rash    Physical Exam: Constitutional: in mild distress with chills/rigors  Vitals:   05/18/22 1500 05/18/22 1530  BP: (!) 109/50   Pulse: 79  Resp: (!) 25   Temp:  98.4 F (36.9 C)  SpO2: 100%    EYES: anicteric Cardiovascular: tachy Respiratory: normal respiratory effort Musculoskeletal: no edema Port-a-cath - some erytheam superior, dry, no purulence.    Lab Results  Component Value Date   WBC 1.3 (LL) 05/18/2022   HGB 13.2 05/18/2022   HCT 39.6 05/18/2022   MCV 89.4 05/18/2022   PLT 65 (L) 05/18/2022    Lab Results  Component Value Date   CREATININE 1.00 05/18/2022   BUN 20 05/18/2022   NA 132 (L) 05/18/2022   K 3.2 (L) 05/18/2022   CL 97 (L) 05/18/2022   CO2 25 05/18/2022    Lab Results  Component Value Date   ALT 30 05/18/2022   AST 19 05/18/2022   GGT 190 (H) 08/08/2019   ALKPHOS 107  05/18/2022     Microbiology: Recent Results (from the past 240 hour(s))  Resp panel by RT-PCR (RSV, Flu A&B, Covid) Anterior Nasal Swab     Status: None   Collection Time: 05/18/22 11:44 AM   Specimen: Anterior Nasal Swab  Result Value Ref Range Status   SARS Coronavirus 2 by RT PCR NEGATIVE NEGATIVE Final    Comment: (NOTE) SARS-CoV-2 target nucleic acids are NOT DETECTED.  The SARS-CoV-2 RNA is generally detectable in upper respiratory specimens during the acute phase of infection. The lowest concentration of SARS-CoV-2 viral copies this assay can detect is 138 copies/mL. A negative result does not preclude SARS-Cov-2 infection and should not be used as the sole basis for treatment or other patient management decisions. A negative result may occur with  improper specimen collection/handling, submission of specimen other than nasopharyngeal swab, presence of viral mutation(s) within the areas targeted by this assay, and inadequate number of viral copies(<138 copies/mL). A negative result must be combined with clinical observations, patient history, and epidemiological information. The expected result is Negative.  Fact Sheet for Patients:  BloggerCourse.com  Fact Sheet for Healthcare Providers:  SeriousBroker.it  This test is no t yet approved or cleared by the [place] FDA and  has been authorized for detection and/or diagnosis of SARS-CoV-2 by FDA under an Emergency Use Authorization (EUA). This EUA will remain  in effect (meaning this test can be used) for the duration of the COVID-19 declaration under Section 564(b)(1) of the Act, 21 U.S.C.section 360bbb-3(b)(1), unless the authorization is terminated  or revoked sooner.       Influenza A by PCR NEGATIVE NEGATIVE Final   Influenza B by PCR NEGATIVE NEGATIVE Final    Comment: (NOTE) The Xpert Xpress SARS-CoV-2/FLU/RSV plus assay is intended as an aid in the  diagnosis of influenza from Nasopharyngeal swab specimens and should not be used as a sole basis for treatment. Nasal washings and aspirates are unacceptable for Xpert Xpress SARS-CoV-2/FLU/RSV testing.  Fact Sheet for Patients: BloggerCourse.com  Fact Sheet for Healthcare Providers: SeriousBroker.it  This test is not yet approved or cleared by the [place] FDA and has been authorized for detection and/or diagnosis of SARS-CoV-2 by FDA under an Emergency Use Authorization (EUA). This EUA will remain in effect (meaning this test can be used) for the duration of the COVID-19 declaration under Section 564(b)(1) of the Act, 21 U.S.C. section 360bbb-3(b)(1), unless the authorization is terminated or revoked.     Resp Syncytial Virus by PCR NEGATIVE NEGATIVE Final    Comment: (NOTE) Fact Sheet for Patients: BloggerCourse.com  Fact Sheet for Healthcare Providers: SeriousBroker.it  This test is not yet approved  or cleared by the [place] FDA and has been authorized for detection and/or diagnosis of SARS-CoV-2 by FDA under an Emergency Use Authorization (EUA). This EUA will remain in effect (meaning this test can be used) for the duration of the COVID-19 declaration under Section 564(b)(1) of the Act, 21 U.S.C. section 360bbb-3(b)(1), unless the authorization is terminated or revoked.  Performed at [facility], [address] [place], [place] [address]     Gardiner Barefootobert W Eulalie Speights, MD [facility] [email or web address] 05/18/2022, 5:20 PM

## 2022-05-18 NOTE — ED Triage Notes (Signed)
Patient presents from [facility] due to a fever. Tylenol was given around 0630 today. Last nigh labs were drawn and it resulted with low RBC, WBC and platelets. The patient has Non-Hodgkin's lymphoma and receives radiation and chemo at the cancer center.     EMS vitals: 100.1 Temp 95% SPO2 on 2 L O2 (chronic) 90 Palpated BP 92 HR 18 RR

## 2022-05-18 NOTE — ED Provider Notes (Signed)
[place] EMERGENCY DEPARTMENT AT [facility] Provider Note   CSN: [id number] Arrival date & time: 05/18/22  1117     History  Chief Complaint  Patient presents with   Fever    Shakeira Rhee is a 72 y.o. female.  Pt is a 72 yo female with pmhx significant for asthma, dm, high cholesterol, obesity, schizophrenia, NASH, GERD, anemia, and Hodgkin's Lymphoma.  Pt is on chemo.  She last received her chemo on 4/2.  She did receive her pegfilgrastim on 4/4.  Pt developed a fever last night.  Labs were drawn at SNF and showed some pancytopenia.  Pt said she hurts everywhere.  Tylenol was given by SNF at 0630.  EMS unsure of dose.       Home Medications Prior to Admission medications   Medication Sig Start Date End Date Taking? Authorizing Provider  acetaminophen (TYLENOL) 500 MG tablet Take 1,000 mg by mouth 3 (three) times daily as needed for moderate pain.    [provider]  albuterol (VENTOLIN HFA) 108 (90 Base) MCG/ACT inhaler Inhale 2 puffs into the lungs every 4 (four) hours as needed for wheezing or shortness of breath.    [provider]  allopurinol (ZYLOPRIM) 100 MG tablet Take 100 mg by mouth daily. 01/19/22   [provider]  amLODipine (NORVASC) 10 MG tablet Take 10 mg by mouth daily.    [provider]  antiseptic oral rinse (BIOTENE) LIQD 15 mLs by Mouth Rinse route as needed for dry mouth. 05/09/22 06/08/22  Briant Cedar, PA-C  calamine lotion Apply 1 Application topically 2 (two) times daily. 03/02/22   Alwyn Ren, MD  carvedilol (COREG) 6.25 MG tablet Take 6.25 mg by mouth 2 (two) times daily with a meal.    [provider]  cholecalciferol (VITAMIN D3) 25 MCG (1000 UNIT) tablet Take 2,000 Units by mouth daily.    [provider]  doxepin (SINEQUAN) 25 MG capsule Take 25 mg by mouth at bedtime.    [provider]  famotidine (PEPCID) 20 MG tablet Take 20 mg by mouth 2 (two) times daily.     [provider]  fluticasone (FLONASE) 50 MCG/ACT nasal spray Place 1 spray into both nostrils daily.    [provider]  glipiZIDE (GLUCOTROL XL) 10 MG 24 hr tablet Take 10 mg by mouth in the morning and at bedtime. 03/13/22   [provider]  guaiFENesin (MUCINEX) 600 MG 12 hr tablet Take 600 mg by mouth every 12 (twelve) hours.    [provider]  hydrOXYzine (ATARAX/VISTARIL) 25 MG tablet Take 25 mg by mouth in the morning and at bedtime.    [provider]  insulin aspart (NOVOLOG) 100 UNIT/ML injection Inject 0-12 Units into the skin See admin instructions. Inject 0-12 units into the skin 4 times a day (before meals and at bedtime) per sliding scale: BGL <60 or >400  = CALL MD; 60-200 = 0 units; 201-250 = 2 units; 251-300 = 4 units; 301-350 = 6 units; 351-400 = 8 units; 401-450 = 10 units; >450 = 12 units 07/31/19   Hollice Espy, MD  insulin glargine (LANTUS) 100 UNIT/ML injection Inject 28 Units into the skin at bedtime.    [provider]  insulin glargine (LANTUS) 100 UNIT/ML injection Inject 40 Units into the skin daily.    [provider]  ipratropium-albuterol (DUONEB) 0.5-2.5 (3) MG/3ML SOLN Take 3 mLs by nebulization.    [provider]  ipratropium-albuterol (DUONEB) 0.5-2.5 (3) MG/3ML SOLN Take 3 mLs by nebulization 3 (three) times daily.    [provider]  lactose free nutrition (BOOST) LIQD Take 237 mLs by mouth 2 (two) times daily between meals.    [provider]  LANTUS SOLOSTAR 100 UNIT/ML Solostar Pen Inject 28-38 Units into the skin 2 (two) times daily. Inject 38 units in the morning and Inject 28 units at bedtime 02/02/22   [provider]  lidocaine 4 % Place 1 patch onto the skin daily.    [provider]  lidocaine-prilocaine (EMLA) cream Apply 1 Application topically as needed. 01/16/22   Georga Kaufmann T, PA-C  lip balm (CARMEX) ointment Apply 1 Application  topically as needed for lip care (cold sores). 03/02/22   Alwyn Ren, MD  lisinopril (ZESTRIL) 5 MG tablet Take 5 mg by mouth daily.    [provider]  loperamide (IMODIUM) 2 MG capsule Take 1 capsule (2 mg total) by mouth as needed for diarrhea or loose stools. 09/09/19   Pokhrel, Rebekah Chesterfield, MD  loratadine (CLARITIN) 10 MG tablet Take 10 mg by mouth daily.    [provider]  miconazole nitrate (MICATIN) POWD Apply 1 Application topically 2 (two) times daily. Patient not taking: Reported on 05/09/2022 03/02/22   Alwyn Ren, MD  mupirocin ointment (BACTROBAN) 2 % Apply 1 Application topically 2 (two) times daily. Patient not taking: Reported on 05/09/2022    [provider]  Nystatin (GERHARDT'S BUTT CREAM) CREA Apply 1 Application topically 4 (four) times daily. Patient not taking: Reported on 05/09/2022 03/02/22   Alwyn Ren, MD  nystatin (MYCOSTATIN) 100000 UNIT/ML suspension Take 5 mLs (500,000 Units total) by mouth 4 (four) times daily. Patient not taking: Reported on 05/09/2022 03/02/22   Alwyn Ren, MD  Pollen Extracts (PROSTAT PO) Take 30 mLs by mouth in the morning and at bedtime.    [provider]  Polyethyl Glycol-Propyl Glycol (SYSTANE ULTRA) 0.4-0.3 % SOLN Place 2 drops into both eyes in the morning and at bedtime.    [provider]  prochlorperazine (COMPAZINE) 10 MG tablet Take 1 tablet (10 mg total) by mouth every 6 (six) hours as needed for nausea or vomiting. 01/16/22   Briant Cedar, PA-C  Propylene Glycol-Glycerin 0.6-0.6 % SOLN Apply 0.6 % to eye 2 (two) times daily. Place two drops into both eyes twice a day.  Wait three minutes before administering another eye med    [provider]  Simethicone (GAS-X ULTRA STRENGTH) 180 MG CAPS Take 1 capsule by mouth every 8 (eight) hours as needed (gas).    [provider]  Skin Protectants, Misc. (EUCERIN) cream Apply 1 Application topically 2 (two)  times daily. Where skin is peeling    [provider]  traMADol (ULTRAM) 50 MG tablet Take 1 tablet (50 mg total) by mouth every 6 (six) hours as needed for moderate pain. 03/02/22   Alwyn Ren, MD  triamcinolone ointment (KENALOG) 0.5 % Apply 1 Application topically 2 (two) times daily.    [provider]  zinc oxide 20 % ointment Apply 1 Application topically See admin instructions. Every shift and as needed (bilateral buttock) Patient not taking: Reported on 05/09/2022    [provider]      Allergies    Emend [aprepitant], Vancomycin, Fosaprepitant, Lasix [furosemide], Lasix [furosemide], and Pineapple    Review of Systems   Review of Systems  Constitutional:  Positive for fever.  Musculoskeletal:  Positive for myalgias.  All other systems reviewed and are negative.   Physical Exam Updated Vital Signs BP 117/61 (BP Location: Right Arm)   Pulse 87   Temp 99.6 F (37.6 C) (Oral)   Resp 20   Ht 5\' 2"  (1.575 m)   Wt 90.5 kg   SpO2 100%   BMI 36.49 kg/m  Physical Exam Vitals and nursing note reviewed.  Constitutional:      Appearance: Normal appearance.  HENT:     Head: Normocephalic and atraumatic.     Right Ear: External ear normal.     Left Ear: External ear normal.     Nose: Nose normal.     Mouth/Throat:     Mouth: Mucous membranes are dry.  Eyes:     Extraocular Movements: Extraocular movements intact.     Conjunctiva/sclera: Conjunctivae normal.     Pupils: Pupils are equal, round, and reactive to light.  Cardiovascular:     Rate and Rhythm: Normal rate and regular rhythm.     Pulses: Normal pulses.     Heart sounds: Normal heart sounds.  Pulmonary:     Effort: Pulmonary effort is normal.     Breath sounds: Normal breath sounds.  Abdominal:     General: Abdomen is flat. Bowel sounds are normal.     Palpations: Abdomen is soft.  Musculoskeletal:        General: Normal range of motion.     Cervical back: Normal range of  motion and neck supple.  Skin:    General: Skin is warm.     Capillary Refill: Capillary refill takes less than 2 seconds.  Neurological:     General: No focal deficit present.     Mental Status: She is alert and oriented to person, place, and time.  Psychiatric:        Mood and Affect: Mood normal.        Behavior: Behavior normal.     ED Results / Procedures / Treatments   Labs (all labs ordered are listed, but only abnormal results are displayed) Labs Reviewed  COMPREHENSIVE METABOLIC PANEL - Abnormal; Notable for the following components:      Result Value   Sodium 132 (*)    Potassium 3.2 (*)    Chloride 97 (*)    Glucose, Bld 245 (*)    Calcium 8.5 (*)    Total Protein 6.3 (*)    Albumin 3.2 (*)    GFR, Estimated 59 (*)    All other components within normal limits  CBC WITH DIFFERENTIAL/PLATELET - Abnormal; Notable for the following components:   WBC 1.3 (*)    Platelets 65 (*)    Neutro Abs 0.2 (*)    All other components within normal limits  APTT - Abnormal; Notable for the following components:   aPTT 37 (*)    All other components within normal limits  RESP PANEL BY RT-PCR (RSV, FLU A&B, COVID)  [id number]  CULTURE, BLOOD (ROUTINE X 2)  CULTURE, BLOOD (ROUTINE X 2)  LACTIC ACID, PLASMA  PROTIME-INR  LACTIC ACID, PLASMA  URINALYSIS, W/ REFLEX TO CULTURE (INFECTION SUSPECTED)    EKG None  Radiology DG Chest Port 1 View  Result Date: 05/18/2022 CLINICAL DATA:  Fever. EXAM: PORTABLE CHEST 1 VIEW COMPARISON:  March 03, 2022. FINDINGS: Stable cardiomediastinal silhouette. Right internal jugular Port-A-Cath is noted with distal tip in expected position of the SVC. Minimal right midlung subsegmental atelectasis or scarring is noted. Left lung is clear. Bony  thorax is unremarkable. IMPRESSION: Stable minimal right midlung subsegmental atelectasis or scarring. Aortic Atherosclerosis (ICD10-I70.0). Electronically Signed   By: Lupita Raider M.D.   On: 05/18/2022  12:01    Procedures Procedures    Medications Ordered in ED Medications  ceFEPIme (MAXIPIME) 2 g in sodium chloride 0.9 % 100 mL IVPB (2 g Intravenous New Bag/Given 05/18/22 1446)  morphine (PF) 4 MG/ML injection 4 mg (4 mg Intravenous Given 05/18/22 1244)  ondansetron (ZOFRAN) injection 4 mg (4 mg Intravenous Given 05/18/22 1244)  sodium chloride 0.9 % bolus 1,000 mL (1,000 mLs Intravenous New Bag/Given 05/18/22 1243)  sodium chloride 0.9 % bolus 1,000 mL (1,000 mLs Intravenous New Bag/Given 05/18/22 1444)  ipratropium-albuterol (DUONEB) 0.5-2.5 (3) MG/3ML nebulizer solution 3 mL (3 mLs Nebulization Given 05/18/22 1444)    ED Course/ Medical Decision Making/ A&P                             Medical Decision Making Amount and/or Complexity of Data Reviewed Labs: ordered. Radiology: ordered. ECG/medicine tests: ordered.  Risk Prescription drug management. Decision regarding hospitalization.   This patient presents to the ED for concern of fever, this involves an extensive number of treatment options, and is a complaint that carries with it a high risk of complications and morbidity.  The differential diagnosis includes sepsis, neutropenic fever.   Co morbidities that complicate the patient evaluation  asthma, dm, high cholesterol, obesity, schizophrenia, NASH, GERD, anemia, and Hodgkin's Lymphoma   Additional history obtained:  Additional history obtained from epic chart review External records from outside source obtained and reviewed including EMS report   Lab Tests:  I Ordered, and personally interpreted labs.  The pertinent results include:  cbc with wbc low at 1.3, plt low at 65; lactic 1.4, cmp with k low 3.2   Imaging Studies ordered:  I ordered imaging studies including cxr  I independently visualized and interpreted imaging which showed  Stable minimal right midlung subsegmental atelectasis or scarring.    Aortic Atherosclerosis (ICD10-I70.0).   I agree with  the radiologist interpretation   Cardiac Monitoring:  The patient was maintained on a cardiac monitor.  I personally viewed and interpreted the cardiac monitored which showed an underlying rhythm of: nsr   Medicines ordered and prescription drug management:  I ordered medication including maxipime  for neutropenic fever  Reevaluation of the patient after these medicines showed that the patient improved I have reviewed the patients home medicines and have made adjustments as needed   Test Considered:  labs   Critical Interventions:  abx   Consultations Obtained:  I requested consultation with the oncologist (Dr. Leonides Schanz),  and discussed lab and imaging findings as well as pertinent plan - he agreed with iv abx and admission.   Pt d/w Dr. Erenest Blank (triad) for admission   Problem List / ED Course:  Neutropenic fever:  No source found so far.  Urine is pending.  Pt given IV Maxipime.  She is allergic to vancomycin.     Reevaluation:  After the interventions noted above, I reevaluated the patient and found that they have :improved   Social Determinants of Health:  Lives in SNF   Dispostion:  After consideration of the diagnostic results and the patients response to treatment, I feel that the patent would benefit from admission.          Final Clinical Impression(s) / ED Diagnoses Final diagnoses:  Neutropenic fever  Hodgkin lymphoma, unspecified Hodgkin lymphoma type, unspecified body region    Rx / DC Orders ED Discharge Orders     None         Jacalyn LefevreHaviland, Armella Stogner, MD 05/18/22 1459

## 2022-05-18 NOTE — H&P (Addendum)
History and Physical  Heather Petty NWG:[id number] DOB: 06/01/1950 DOA: 05/18/2022  PCP: System, Provider Not In   Chief Complaint: fever   HPI: Heather Petty is a 72 y.o. female with medical history significant for asthma, insulin-dependent type 2 diabetes, obesity, schizophrenia, GERD, anemia, on chemotherapy currently for Hodgkin's lymphoma with last infusion 4/2 and pegfilgrastim given on 4/4 who is being admitted for febrile neutropenia.  Patient states she was feeling her usual state of health until last night/this morning when she started having a fever of 101, and has aching pain all over.  She was given Tylenol at 6:30 AM at her facility, so she has since been afebrile here.  Since starting chemotherapy, she has had 2 suspected central line infections and most recently had suspected infected port removed on 1/23 and was discharged back to her nursing home on 1/25 with IV daptomycin and Rocephin for 2 weeks.  She tells me that since yesterday, she has also had a bit of a cough, having some subjective chills, and vomited once.  She denies chest pain, abdominal pain, diarrhea.  Denies any rashes on her skin.  ED Course: On arrival in the emergency department, she is afebrile, hemodynamically stable with borderline low blood pressure.  Lab work was done which reveals neutropenia with total white blood cell count 1.3K, renal function stable, she has new mild hyponatremia 132, and hypokalemia potassium 3.2.  She was given a dose of empiric IV cefepime, and hospitalist contacted for admission.  Review of Systems: Please see HPI for pertinent positives and negatives. A complete 10 system review of systems are otherwise negative.  Past Medical History:  Diagnosis Date   AKI (acute kidney injury) 12/17/2017   Allergic rhinitis    Anemia    Arthritis    Asthma    Autonomic neuropathy    Depression    Diabetes mellitus    Diabetes mellitus without complication    E coli infection    GERD  (gastroesophageal reflux disease)    GERD (gastroesophageal reflux disease)    Gout    Hepatitis A    High cholesterol    Hypertension    Insomnia    Kidney failure    Metabolic encephalopathy    Morbid obesity    NASH (nonalcoholic steatohepatitis)    Pneumonia 12/17/2017   Schizophrenia    Stroke    Vitamin B deficiency    Vitamin D deficiency    Past Surgical History:  Procedure Laterality Date   CYST EXCISION     buttucks   DENTAL SURGERY     IR IMAGING GUIDED PORT INSERTION  01/10/2022   IR IMAGING GUIDED PORT INSERTION  04/21/2022   IR REMOVAL TUN ACCESS W/ PORT W/O FL MOD SED  02/28/2022   TONSILLECTOMY      Social History:  reports that she has never smoked. She has never used smokeless tobacco. She reports that she does not drink alcohol and does not use drugs.   Allergies  Allergen Reactions   Emend [Aprepitant] Other (See Comments)    Pt experienced shooting back pain at 10/10 when medication was administered the first time. See Hypersensitivity note from 01/17/2022.   Vancomycin Other (See Comments)    Patient becomes hypotensive, lethargic and itchy. Has tolerated it many times.   Fosaprepitant Other (See Comments)    Back pain / hypersensitivity 01/17/2022   Lasix [Furosemide] Other (See Comments)    IV-LASIX ==> Reaction: Burning   Lasix [Furosemide] Other (See Comments)  On MAR   Pineapple Rash    Family History  Adopted: Yes  Problem Relation Age of Onset   COPD Daughter      Prior to Admission medications   Medication Sig Start Date End Date Taking? Authorizing Provider  acetaminophen (TYLENOL) 500 MG tablet Take 1,000 mg by mouth 3 (three) times daily as needed for moderate pain.    [provider]  albuterol (VENTOLIN HFA) 108 (90 Base) MCG/ACT inhaler Inhale 2 puffs into the lungs every 4 (four) hours as needed for wheezing or shortness of breath.    [provider]  allopurinol (ZYLOPRIM) 100 MG tablet Take 100 mg by mouth  daily. 01/19/22   [provider]  amLODipine (NORVASC) 10 MG tablet Take 10 mg by mouth daily.    [provider]  antiseptic oral rinse (BIOTENE) LIQD 15 mLs by Mouth Rinse route as needed for dry mouth. 05/09/22 06/08/22  Briant Cedar, PA-C  calamine lotion Apply 1 Application topically 2 (two) times daily. 03/02/22   Alwyn Ren, MD  carvedilol (COREG) 6.25 MG tablet Take 6.25 mg by mouth 2 (two) times daily with a meal.    [provider]  cholecalciferol (VITAMIN D3) 25 MCG (1000 UNIT) tablet Take 2,000 Units by mouth daily.    [provider]  doxepin (SINEQUAN) 25 MG capsule Take 25 mg by mouth at bedtime.    [provider]  famotidine (PEPCID) 20 MG tablet Take 20 mg by mouth 2 (two) times daily.    [provider]  fluticasone (FLONASE) 50 MCG/ACT nasal spray Place 1 spray into both nostrils daily.    [provider]  glipiZIDE (GLUCOTROL XL) 10 MG 24 hr tablet Take 10 mg by mouth in the morning and at bedtime. 03/13/22   [provider]  guaiFENesin (MUCINEX) 600 MG 12 hr tablet Take 600 mg by mouth every 12 (twelve) hours.    [provider]  hydrOXYzine (ATARAX/VISTARIL) 25 MG tablet Take 25 mg by mouth in the morning and at bedtime.    [provider]  insulin aspart (NOVOLOG) 100 UNIT/ML injection Inject 0-12 Units into the skin See admin instructions. Inject 0-12 units into the skin 4 times a day (before meals and at bedtime) per sliding scale: BGL <60 or >400  = CALL MD; 60-200 = 0 units; 201-250 = 2 units; 251-300 = 4 units; 301-350 = 6 units; 351-400 = 8 units; 401-450 = 10 units; >450 = 12 units 07/31/19   Hollice Espy, MD  insulin glargine (LANTUS) 100 UNIT/ML injection Inject 28 Units into the skin at bedtime.    [provider]  insulin glargine (LANTUS) 100 UNIT/ML injection Inject 40 Units into the skin daily.    [provider]  ipratropium-albuterol (DUONEB)  0.5-2.5 (3) MG/3ML SOLN Take 3 mLs by nebulization.    [provider]  ipratropium-albuterol (DUONEB) 0.5-2.5 (3) MG/3ML SOLN Take 3 mLs by nebulization 3 (three) times daily.    [provider]  lactose free nutrition (BOOST) LIQD Take 237 mLs by mouth 2 (two) times daily between meals.    [provider]  LANTUS SOLOSTAR 100 UNIT/ML Solostar Pen Inject 28-38 Units into the skin 2 (two) times daily. Inject 38 units in the morning and Inject 28 units at bedtime 02/02/22   [provider]  lidocaine 4 % Place 1 patch onto the skin daily.    [provider]  lidocaine-prilocaine (EMLA) cream Apply 1 Application  topically as needed. 01/16/22   Georga Kaufmann T, PA-C  lip balm (CARMEX) ointment Apply 1 Application topically as needed for lip care (cold sores). 03/02/22   Alwyn Ren, MD  lisinopril (ZESTRIL) 5 MG tablet Take 5 mg by mouth daily.    [provider]  loperamide (IMODIUM) 2 MG capsule Take 1 capsule (2 mg total) by mouth as needed for diarrhea or loose stools. 09/09/19   Pokhrel, Rebekah [place], MD  loratadine (CLARITIN) 10 MG tablet Take 10 mg by mouth daily.    [provider]  miconazole nitrate (MICATIN) POWD Apply 1 Application topically 2 (two) times daily. Patient not taking: Reported on 05/09/2022 03/02/22   Alwyn Ren, MD  mupirocin ointment (BACTROBAN) 2 % Apply 1 Application topically 2 (two) times daily. Patient not taking: Reported on 05/09/2022    [provider]  Nystatin (GERHARDT'S BUTT CREAM) CREA Apply 1 Application topically 4 (four) times daily. Patient not taking: Reported on 05/09/2022 03/02/22   Alwyn Ren, MD  nystatin (MYCOSTATIN) 100000 UNIT/ML suspension Take 5 mLs (500,000 Units total) by mouth 4 (four) times daily. Patient not taking: Reported on 05/09/2022 03/02/22   Alwyn Ren, MD  Pollen Extracts (PROSTAT PO) Take 30 mLs by mouth in the morning and at bedtime.     [provider]  Polyethyl Glycol-Propyl Glycol (SYSTANE ULTRA) 0.4-0.3 % SOLN Place 2 drops into both eyes in the morning and at bedtime.    [provider]  prochlorperazine (COMPAZINE) 10 MG tablet Take 1 tablet (10 mg total) by mouth every 6 (six) hours as needed for nausea or vomiting. 01/16/22   Briant Cedar, PA-C  Propylene Glycol-Glycerin 0.6-0.6 % SOLN Apply 0.6 % to eye 2 (two) times daily. Place two drops into both eyes twice a day.  Wait three minutes before administering another eye med    [provider]  Simethicone (GAS-X ULTRA STRENGTH) 180 MG CAPS Take 1 capsule by mouth every 8 (eight) hours as needed (gas).    [provider]  Skin Protectants, Misc. (EUCERIN) cream Apply 1 Application topically 2 (two) times daily. Where skin is peeling    [provider]  traMADol (ULTRAM) 50 MG tablet Take 1 tablet (50 mg total) by mouth every 6 (six) hours as needed for moderate pain. 03/02/22   Alwyn Ren, MD  triamcinolone ointment (KENALOG) 0.5 % Apply 1 Application topically 2 (two) times daily.    [provider]  zinc oxide 20 % ointment Apply 1 Application topically See admin instructions. Every shift and as needed (bilateral buttock) Patient not taking: Reported on 05/09/2022    [provider]    Physical Exam: BP 117/61 (BP Location: Right Arm)   Pulse 87   Temp 99.6 F (37.6 C) (Oral)   Resp 20   Ht 5\' 2"  (1.575 m)   Wt 90.5 kg   SpO2 100%   BMI 36.49 kg/m   General:  Alert, oriented, calm, in no acute distress, chronically ill-appearing Eyes: EOMI, clear conjuctivae, white sclerea Neck: supple, no masses, trachea mildline  Cardiovascular: RRR, no murmurs or rubs, no peripheral edema  Respiratory: clear to auscultation bilaterally, no wheezes, no crackles  Abdomen: soft, nontender, nondistended, normal bowel tones heard  Skin: dry and excoriated, she has a small area of confluent redness and  tenderness surrounding superior to her right anterior chest Port-A-Cath.  Does not appear to be any drainage, no area of fluctuance, but is tender to  palpation. Musculoskeletal: no joint effusions, normal range of motion  Psychiatric: appropriate affect, normal speech  Neurologic: extraocular muscles intact, clear speech, moving all extremities with intact sensorium          Labs on Admission:  Basic Metabolic Panel: Recent Labs  Lab 05/18/22 1248  NA 132*  K 3.2*  CL 97*  CO2 25  GLUCOSE 245*  BUN 20  CREATININE 1.00  CALCIUM 8.5*   Liver Function Tests: Recent Labs  Lab 05/18/22 1248  AST 19  ALT 30  ALKPHOS 107  BILITOT 1.0  PROT 6.3*  ALBUMIN 3.2*   No results for input(s): "LIPASE", "AMYLASE" in the last 168 hours. No results for input(s): "AMMONIA" in the last 168 hours. CBC: Recent Labs  Lab 05/18/22 1248  WBC 1.3*  NEUTROABS 0.2*  HGB 13.2  HCT 39.6  MCV 89.4  PLT 65*   Cardiac Enzymes: No results for input(s): "CKTOTAL", "CKMB", "CKMBINDEX", "TROPONINI" in the last 168 hours.  BNP (last 3 results) Recent Labs    03/03/22 0939  BNP 170.6*    ProBNP (last 3 results) No results for input(s): "PROBNP" in the last 8760 hours.  CBG: No results for input(s): "GLUCAP" in the last 168 hours.  Radiological Exams on Admission: DG Chest Port 1 View  Result Date: 05/18/2022 CLINICAL DATA:  Fever. EXAM: PORTABLE CHEST 1 VIEW COMPARISON:  March 03, 2022. FINDINGS: Stable cardiomediastinal silhouette. Right internal jugular Port-A-Cath is noted with distal tip in expected position of the SVC. Minimal right midlung subsegmental atelectasis or scarring is noted. Left lung is clear. Bony thorax is unremarkable. IMPRESSION: Stable minimal right midlung subsegmental atelectasis or scarring. Aortic Atherosclerosis (ICD10-I70.0). Electronically Signed   By: Lupita Raider M.D.   On: 05/18/2022 12:01    Assessment/Plan Principal Problem:   Febrile neutropenia-as  a result of her chemotherapy, most likely sources cellulitis and possible line infection of her Port-A-Cath. -Inpatient admission -Peripheral blood cultures x 2 have been obtained, will obtain blood culture from port -Continue empiric cefepime, add IV vancomycin -Discussed with infectious disease Dr. Luciana Axe who will consult   Hodgkin lymphoma-her oncologist Dr. Leonides Schanz was notified by EP, will follow   Hyperlipidemia associated with type 2 diabetes mellitus   Hyp AZ not crazy but steady 3 today up to get down like Tylenol or I think Sonya otension   GERD (gastroesophageal reflux disease)   DM II (diabetes mellitus, type II), controlled-diabetic diet when eating, will resume basal insulin once reconciled by pharmacy staff, with sliding scale insulin   Schizophrenia   Essential hypertension-patient is currently borderline hypotensive, will hold any antihypertensives for now Hyponatremia-very mild, asymptomatic, will hydrate gently and recheck with morning labs Hypokalemia-will replace orally and recheck with morning labs  DVT prophylaxis: Lovenox     Code Status: Full Code  Consults called: ID Dr. Luciana Axe, EDP consulted Dr. Leonides Schanz oncology  Admission status: The appropriate patient status for this patient is INPATIENT. Inpatient status is judged to be reasonable and necessary in order to provide the required intensity of service to ensure the patient's safety. The patient's presenting symptoms, physical exam findings, and initial radiographic and laboratory data in the context of their chronic comorbidities is felt to place them at high risk for further clinical deterioration. Furthermore, it is not anticipated that the patient will be medically stable for discharge from the hospital within 2 midnights of admission.    I certify that at the point of admission it is my clinical judgment that  the patient will require inpatient hospital care spanning beyond 2 midnights from the point of admission due  to high intensity of service, high risk for further deterioration and high frequency of surveillance required  Time spent: 65 minutes  Zonya Gudger Sharlette Dense MD [facility] Pager [phone]  If 7PM-7AM, please contact night-coverage [email or web address] Password [facility]  05/18/2022, 3:04 PM

## 2022-05-18 NOTE — Progress Notes (Signed)
PHARMACY NOTE:  ANTIMICROBIAL RENAL DOSAGE ADJUSTMENT  Current antimicrobial regimen includes a mismatch between antimicrobial dosage and estimated renal function.  As per policy approved by the Pharmacy & Therapeutics and Medical Executive Committees, the antimicrobial dosage will be adjusted accordingly.  Current antimicrobial dosage: Cefepime 2gm iv q 8 hrs    Indication: febrile neutropenia  Renal Function: as of 05/17/21 CrCl=52  Estimated Creatinine Clearance: 52.4 mL/min (by C-G formula based on SCr of 1 mg/dL).    Antimicrobial dosage has been changed to:  Cefepime 2gm iv q 12hrs   Thank you for allowing pharmacy to be a part of this patient's care.  Josefa Half, [facility] 05/18/2022 3:10 PM

## 2022-05-18 NOTE — ED Notes (Signed)
Patient requests ice on her port. Delay ion obtaining labs.

## 2022-05-18 NOTE — Progress Notes (Signed)
Pharmacy Antibiotic Note  Heather Petty is a 72 y.o. female admitted on 05/18/2022 with fever.Patient is on chemotherapy, last received treatment 4/2. She received peg-filgrastim 4/4. Patient is neutropenic with self-reported fever PTA. Pharmacy has been consulted for vancomycin dosing for suspected cellulitis at the port.  Plan: -Vancomycin 2 g IV x 1 followed by 750 mg IV q24h -Cefepime 2 g IV q12h -Follow renal function, cultures and clinical progress for dose adjustments and de-escalation as indicated  Height: 5\' 2"  (157.5 cm) Weight: 90.5 kg (199 lb 8 oz) IBW/kg (Calculated) : 50.1  Temp (24hrs), Avg:99.6 F (37.6 C), Min:99.6 F (37.6 C), Max:99.6 F (37.6 C)  Recent Labs  Lab 05/18/22 1248  WBC 1.3*  CREATININE 1.00  LATICACIDVEN 1.4    Estimated Creatinine Clearance: 52.4 mL/min (by C-G formula based on SCr of 1 mg/dL).    Allergies  Allergen Reactions   Emend [Aprepitant] Other (See Comments)    Pt experienced shooting back pain at 10/10 when medication was administered the first time. See Hypersensitivity note from 01/17/2022.   Vancomycin Other (See Comments)    Patient becomes hypotensive, lethargic and itchy. Has tolerated it many times.   Fosaprepitant Other (See Comments)    Back pain / hypersensitivity 01/17/2022   Lasix [Furosemide] Other (See Comments)    IV-LASIX ==> Reaction: Burning   Lasix [Furosemide] Other (See Comments)    On MAR   Pineapple Rash    Antimicrobials this admission: Vancomycin 4/11 >> Cefepime 4/11 >>  Dose adjustments this admission: NA  Microbiology results: 4/11 BCx: pending   Thank you for allowing pharmacy to be a part of this patient's care.  Pricilla Riffle, PharmD, BCPS Clinical Pharmacist 05/18/2022 3:23 PM

## 2022-05-18 NOTE — Progress Notes (Signed)
Pharmacy Note   A consult was received from an ED physician for cefepime per pharmacy dosing.    The patient's profile has been reviewed for ht/wt/allergies/indication/available labs.    A one time order has been placed for cefepime 2 gr IV x1 .    Further antibiotics/pharmacy consults should be ordered by admitting physician if indicated.                       Thank you,  Adalberto Cole, PharmD, BCPS 05/18/2022 2:32 PM

## 2022-05-19 DIAGNOSIS — R5081 Fever presenting with conditions classified elsewhere: Secondary | ICD-10-CM | POA: Diagnosis not present

## 2022-05-19 DIAGNOSIS — N179 Acute kidney failure, unspecified: Secondary | ICD-10-CM | POA: Diagnosis not present

## 2022-05-19 DIAGNOSIS — Z95828 Presence of other vascular implants and grafts: Secondary | ICD-10-CM

## 2022-05-19 DIAGNOSIS — D709 Neutropenia, unspecified: Secondary | ICD-10-CM | POA: Diagnosis not present

## 2022-05-19 LAB — BASIC METABOLIC PANEL
Anion gap: 10 (ref 5–15)
BUN: 22 mg/dL (ref 8–23)
CO2: 20 mmol/L — ABNORMAL LOW (ref 22–32)
Calcium: 8.1 mg/dL — ABNORMAL LOW (ref 8.9–10.3)
Chloride: 104 mmol/L (ref 98–111)
Creatinine, Ser: 1.15 mg/dL — ABNORMAL HIGH (ref 0.44–1.00)
GFR, Estimated: 50 mL/min — ABNORMAL LOW (ref >60)
Glucose, Bld: 133 mg/dL — ABNORMAL HIGH (ref 70–99)
Potassium: 3.8 mmol/L (ref 3.5–5.1)
Sodium: 134 mmol/L — ABNORMAL LOW (ref 135–145)

## 2022-05-19 LAB — GLUCOSE, CAPILLARY
Glucose-Capillary: 129 mg/dL — ABNORMAL HIGH (ref 70–99)
Glucose-Capillary: 199 mg/dL — ABNORMAL HIGH (ref 70–99)
Glucose-Capillary: 193 mg/dL — ABNORMAL HIGH (ref 70–99)
Glucose-Capillary: 135 mg/dL — ABNORMAL HIGH (ref 70–99)

## 2022-05-19 LAB — CBC
HCT: 27.5 % — ABNORMAL LOW (ref 36.0–46.0)
Hemoglobin: 8.9 g/dL — ABNORMAL LOW (ref 12.0–15.0)
MCH: 30.1 pg (ref 26.0–34.0)
MCHC: 32.4 g/dL (ref 30.0–36.0)
MCV: 92.9 fL (ref 80.0–100.0)
Platelets: 80 10*3/uL — ABNORMAL LOW (ref 150–400)
RBC: 2.96 MIL/uL — ABNORMAL LOW (ref 3.87–5.11)
RDW: 15.8 % — ABNORMAL HIGH (ref 11.5–15.5)
WBC: 3.5 10*3/uL — ABNORMAL LOW (ref 4.0–10.5)
nRBC: 0.9 % — ABNORMAL HIGH (ref 0.0–0.2)

## 2022-05-19 LAB — CULTURE, BLOOD (ROUTINE X 2): Culture: NO GROWTH

## 2022-05-19 LAB — MRSA NEXT GEN BY PCR, NASAL: MRSA by PCR Next Gen: DETECTED — AB

## 2022-05-19 MED ORDER — MORPHINE SULFATE (PF) 2 MG/ML IV SOLN: 2.0000 mg | INTRAVENOUS | Status: DC | PRN

## 2022-05-19 MED ORDER — CHLORHEXIDINE GLUCONATE CLOTH 2 % EX PADS
6.0000 | MEDICATED_PAD | Freq: Every day | CUTANEOUS | Status: DC
  Administered 2022-05-19 – 2022-05-24 (×6): 6 via TOPICAL

## 2022-05-19 MED ORDER — DOXEPIN HCL 25 MG PO CAPS
25.0000 mg | ORAL_CAPSULE | Freq: Every day | ORAL | Status: DC
  Administered 2022-05-19 – 2022-05-23 (×5): 25 mg via ORAL
  Filled 2022-05-19 (×5): qty 1

## 2022-05-19 MED ORDER — FAMOTIDINE 20 MG PO TABS
20.0000 mg | ORAL_TABLET | Freq: Two times a day (BID) | ORAL | Status: DC
  Administered 2022-05-19 – 2022-05-24 (×11): 20 mg via ORAL
  Filled 2022-05-19 (×11): qty 1

## 2022-05-19 MED ORDER — HYDROXYZINE HCL 25 MG PO TABS
25.0000 mg | ORAL_TABLET | Freq: Three times a day (TID) | ORAL | Status: DC | PRN
  Administered 2022-05-19 – 2022-05-23 (×5): 25 mg via ORAL
  Filled 2022-05-19 (×5): qty 1

## 2022-05-19 NOTE — Assessment & Plan Note (Signed)
-   Mild and clinically insignificant - s/p fluids

## 2022-05-19 NOTE — Hospital Course (Signed)
Heather Petty is a 72 yo female with PMH Hodgkin's lymphoma, schizophrenia, obesity, DM II, asthma who presented with fever from her nursing facility.  She also developed aching pain throughout.  Recent chemoinfusion on 05/09/2022. She also has a known history of suspected port infection and underwent removal on 02/28/2022 and completion of IV antibiotics for 2 weeks.  Port was reimplanted with radiology on 04/21/2022. On admission, there was some redness around her port site along with increased pain.  Fever on admission noted at 103.1.  Neutropenia also noted, WBC 1.3.  She was started on IV antibiotics, ID was consulted.

## 2022-05-19 NOTE — Progress Notes (Signed)
[facility]   Reason for visit: Follow up on fever  Interval History: WBC up to 3.5, fever curve with a tend down since yesterday.  Feels better overall.  Day 2 vancomycin and cefepime  Physical Exam: Constitutional:  Vitals:   05/19/22 0549 05/19/22 1454  BP: (!) 145/52 (!) 115/44  Pulse: (!) 104 93  Resp: 18 18  Temp: 99.9 F (37.7 C) (!) 100.5 F (38.1 C)  SpO2: 100% 99%   patient appears in NAD Respiratory: Normal respiratory effort; CTA B Port with no fluctuance, no change in erythema  Review of Systems: Constitutional: positive for fevers and chills  Lab Results  Component Value Date   WBC 3.5 (L) 05/19/2022   HGB 8.9 (L) 05/19/2022   HCT 27.5 (L) 05/19/2022   MCV 92.9 05/19/2022   PLT 80 (L) 05/19/2022    Lab Results  Component Value Date   CREATININE 1.15 (H) 05/19/2022   BUN 22 05/19/2022   NA 134 (L) 05/19/2022   K 3.8 05/19/2022   CL 104 05/19/2022   CO2 20 (L) 05/19/2022    Lab Results  Component Value Date   ALT 30 05/18/2022   AST 19 05/18/2022   GGT 190 (H) 08/08/2019   ALKPHOS 107 05/18/2022     Microbiology: Recent Results (from the past 240 hour(s))  Resp panel by RT-PCR (RSV, Flu A&B, Covid) Anterior Nasal Swab     Status: None   Collection Time: 05/18/22 11:44 AM   Specimen: Anterior Nasal Swab  Result Value Ref Range Status   SARS Coronavirus 2 by RT PCR NEGATIVE NEGATIVE Final    Comment: (NOTE) SARS-CoV-2 target nucleic acids are NOT DETECTED.  The SARS-CoV-2 RNA is generally detectable in upper respiratory specimens during the acute phase of infection. The lowest concentration of SARS-CoV-2 viral copies this assay can detect is 138 copies/mL. A negative result does not preclude SARS-Cov-2 infection and should not be used as the sole basis for treatment or other patient management decisions. A negative result may occur with  improper specimen collection/handling, submission of specimen other than  nasopharyngeal swab, presence of viral mutation(s) within the areas targeted by this assay, and inadequate number of viral copies(<138 copies/mL). A negative result must be combined with clinical observations, patient history, and epidemiological information. The expected result is Negative.  Fact Sheet for Patients:  BloggerCourse.com  Fact Sheet for Healthcare Providers:  SeriousBroker.it  This test is no t yet approved or cleared by the [place] FDA and  has been authorized for detection and/or diagnosis of SARS-CoV-2 by FDA under an Emergency Use Authorization (EUA). This EUA will remain  in effect (meaning this test can be used) for the duration of the COVID-19 declaration under Section 564(b)(1) of the Act, 21 U.S.C.section 360bbb-3(b)(1), unless the authorization is terminated  or revoked sooner.       Influenza A by PCR NEGATIVE NEGATIVE Final   Influenza B by PCR NEGATIVE NEGATIVE Final    Comment: (NOTE) The Xpert Xpress SARS-CoV-2/FLU/RSV plus assay is intended as an aid in the diagnosis of influenza from Nasopharyngeal swab specimens and should not be used as a sole basis for treatment. Nasal washings and aspirates are unacceptable for Xpert Xpress SARS-CoV-2/FLU/RSV testing.  Fact Sheet for Patients: BloggerCourse.com  Fact Sheet for Healthcare Providers: SeriousBroker.it  This test is not yet approved or cleared by the [place] FDA and has been authorized for detection and/or diagnosis of SARS-CoV-2 by FDA under an  Emergency Use Authorization (EUA). This EUA will remain in effect (meaning this test can be used) for the duration of the COVID-19 declaration under Section 564(b)(1) of the Act, 21 U.S.C. section 360bbb-3(b)(1), unless the authorization is terminated or revoked.     Resp Syncytial Virus by PCR NEGATIVE NEGATIVE Final    Comment:  (NOTE) Fact Sheet for Patients: BloggerCourse.com  Fact Sheet for Healthcare Providers: SeriousBroker.it  This test is not yet approved or cleared by the [place] FDA and has been authorized for detection and/or diagnosis of SARS-CoV-2 by FDA under an Emergency Use Authorization (EUA). This EUA will remain in effect (meaning this test can be used) for the duration of the COVID-19 declaration under Section 564(b)(1) of the Act, 21 U.S.C. section 360bbb-3(b)(1), unless the authorization is terminated or revoked.  Performed at [facility], [address], [place], [place] [address]   Blood Culture (routine x 2)     Status: None (Preliminary result)   Collection Time: 05/18/22 12:48 PM   Specimen: BLOOD  Result Value Ref Range Status   Specimen Description   Final    BLOOD Port Performed at [facility], [address] [place], [place] [address]    Special Requests   Final    BOTTLES DRAWN AEROBIC AND ANAEROBIC Blood Culture adequate volume Performed at [facility], [address] [place], [place] [address]    Culture   Final    NO GROWTH < 24 HOURS Performed at [facility], [address] [place], [place] [address]    Report Status PENDING  Incomplete  Blood Culture (routine x 2)     Status: None (Preliminary result)   Collection Time: 05/18/22 12:48 PM   Specimen: BLOOD  Result Value Ref Range Status   Specimen Description   Final    BLOOD PORTA CATH Performed at [facility], [address] [place], [place] [address]    Special Requests   Final    BOTTLES DRAWN AEROBIC AND ANAEROBIC Blood Culture adequate volume Performed at [facility], [address], [place], [place] [address]    Culture   Final    NO GROWTH < 24 HOURS Performed at [facility], [address], [place], [place] [address]    Report  Status PENDING  Incomplete  MRSA Next Gen by PCR, Nasal     Status: Abnormal   Collection Time: 05/19/22 11:07 AM   Specimen: Nasal Mucosa; Nasal Swab  Result Value Ref Range Status   MRSA by PCR Next Gen DETECTED (A) NOT DETECTED Final    Comment: (NOTE) The GeneXpert MRSA Assay (FDA approved for NASAL specimens only), is one component of a comprehensive MRSA colonization surveillance program. It is not intended to diagnose MRSA infection nor to guide or monitor treatment for MRSA infections. Test performance is not FDA approved in patients less than 61 years old. Performed at [facility], [address] [place], [place] [address]     Impression/Plan:  1. Febrile neutropenia - no positive cultures to date.  Feels better on empiric treatment.  Both blood cultures from the port instead of peripheral. I will repeat the blood cultures Continue with vancomycin and cefepime I do not feel port removal absolutely indicated with negative blood cultures but will continue to monitor  2. Neutropenia - improved WBC today.  Will continue to monitor  3.  Acute renal insufficiency - mild increase in creat  today. On vancomycin and if worsens, can change to daptomycin over the weekend.   Dr. Earlene Plater will monitor cultures over the weekend, otherwise I will follow up again on Monday.

## 2022-05-19 NOTE — Assessment & Plan Note (Signed)
-   Cycle 3 completed on 05/09/2022 (doxorubicin, vinblastine, dacarbazine) -Treated with PEG filgrastim on 05/11/2022 - nadir happening at this time -WBC already improving today, if does become severely neutropenic, will likely initiate Granix and discuss with oncology -Unclear source at this time.  History of suspected infected port and possibly similar again. CXR clear. UA negative.  Blood cultures in process and remaining negative from port and peripheral  -Port appears benign.  -ID following as well.  Continue vancomycin and cefepime

## 2022-05-19 NOTE — Assessment & Plan Note (Signed)
-   See febrile neutropenia -Port reimplanted 04/21/2022

## 2022-05-19 NOTE — Assessment & Plan Note (Signed)
-   continue pepcid

## 2022-05-19 NOTE — Assessment & Plan Note (Addendum)
-   Stage III Hodgkin's lymphoma, diagnosed November 2023.  Follows with Dr. Leonides Schanz. -Continue allopurinol for TLS prophylaxis -Next treatment with oncology planned for 06/06/2022

## 2022-05-19 NOTE — Assessment & Plan Note (Signed)
-   improved - BP meds on hold

## 2022-05-19 NOTE — Assessment & Plan Note (Addendum)
-   Continue doxepin.  Previously had also been on Risperdal also but no longer on med rec

## 2022-05-19 NOTE — Progress Notes (Signed)
Progress Note    Heather Petty   ZOX:[id number]  DOB: Jul 18, 1950  DOA: 05/18/2022     1 PCP: System, Provider Not In  Initial CC: fever, aches  Hospital Course: Heather Petty is a 72 yo female with PMH Hodgkin's lymphoma, schizophrenia, obesity, DM II, asthma who presented with fever from her nursing facility.  She also developed aching pain throughout.  Recent chemoinfusion on 05/09/2022. She also has a known history of suspected port infection and underwent removal on 02/28/2022 and completion of IV antibiotics for 2 weeks.  Port was reimplanted with radiology on 04/21/2022. On admission, there was some redness around her port site along with increased pain.  Fever on admission noted at 103.1.  Neutropenia also noted, WBC 1.3.  She was started on IV antibiotics, ID was consulted.  Interval History:  Patient laying in bed moaning this morning and unable to talk to me much.  She was able to tell me where she lived and that she could not breathe sitting up so much and was wanting to lay back flat.  Aside from that, she was a poor historian and would not really answer any other questions.  Mostly laid there moaning.  Assessment and Plan: * Febrile neutropenia - Cycle 3 completed on 05/09/2022 (doxorubicin, vinblastine, dacarbazine) -Treated with PEG filgrastim on 05/11/2022 - nadir happening at this time -WBC already improving today, if does become severely neutropenic, will likely initiate Granix and discuss with oncology -Unclear source at this time.  History of suspected infected port and possibly similar again.  See [place] are clear, UA negative.  Blood cultures in process -ID following as well.  Continue vancomycin and cefepime  Port-A-Cath in place - See febrile neutropenia -Port reimplanted 04/21/2022  Hodgkin lymphoma - Stage III Hodgkin's lymphoma, diagnosed November 2023.  Follows with Dr. Leonides Petty. -Continue allopurinol for TLS prophylaxis  Hyponatremia - Mild and clinically  insignificant - Continue fluids  Schizophrenia - Continue doxepin.  Previously had also been on Risperdal also but no longer on med rec  DM II (diabetes mellitus, type II), controlled - Continue SSI and CBG monitoring  GERD (gastroesophageal reflux disease) - continue pepcid  Hypotension - improved - BP meds on hold  HTN (hypertension) - hypotensive previously; BP meds on hold   Old records reviewed in assessment of this patient  Antimicrobials: Vancomycin 05/18/2022 >> current Cefepime 05/18/2022 >> current  DVT prophylaxis:  enoxaparin (LOVENOX) injection 40 mg Start: 05/18/22 2200 SCDs Start: 05/18/22 1503   Code Status:   Code Status: Full Code  Mobility Assessment (last 72 hours)     Mobility Assessment     Row Name 05/18/22 1955           Does patient have an order for bedrest or is patient medically unstable Yes- Bedfast (Level 1) - Complete                Barriers to discharge:  Disposition Plan: SNF Status is: Inpt  Objective: Blood pressure (!) 145/52, pulse (!) 104, temperature 99.9 F (37.7 C), temperature source Oral, resp. rate 18, height  (1.575 m), weight 93.7 kg, SpO2 100 %.  Examination:  Physical Exam Constitutional:      Appearance: She is obese.     Comments: Elderly woman laying in bed moaning and appearing uncomfortable  HENT:     Head: Normocephalic and atraumatic.     Mouth/Throat:     Mouth: Mucous membranes are dry.  Eyes:     Extraocular Movements:  Extraocular movements intact.  Cardiovascular:     Rate and Rhythm: Normal rate and regular rhythm.  Pulmonary:     Effort: Pulmonary effort is normal. No respiratory distress.     Breath sounds: Normal breath sounds. No wheezing.  Chest:     Comments: Port-A-Cath in place with possible tenderness near port but patient rather tender diffusely throughout body from aches.  No significant erythema appreciated nor any drainage or palpable fluctuance Abdominal:      General: Bowel sounds are normal. There is no distension.     Palpations: Abdomen is soft.     Tenderness: There is no abdominal tenderness.  Musculoskeletal:        General: Normal range of motion.     Cervical back: Normal range of motion and neck supple.  Skin:    Comments: Diffuse vitiligo appreciated  Neurological:     Comments: Moves all 4 extremities and can follow some commands.  Psychiatric:        Mood and Affect: Mood normal.      Consultants:  ID  Procedures:    Data Reviewed: Results for orders placed or performed during the hospital encounter of 05/18/22 (from the past 24 hour(s))  CBG monitoring, ED     Status: Abnormal   Collection Time: 05/18/22  5:07 PM  Result Value Ref Range   Glucose-Capillary 186 (H) 70 - 99 mg/dL  Glucose, capillary     Status: Abnormal   Collection Time: 05/18/22  6:39 PM  Result Value Ref Range   Glucose-Capillary 157 (H) 70 - 99 mg/dL  Glucose, capillary     Status: Abnormal   Collection Time: 05/18/22 10:03 PM  Result Value Ref Range   Glucose-Capillary 165 (H) 70 - 99 mg/dL  Basic metabolic panel     Status: Abnormal   Collection Time: 05/19/22  5:05 AM  Result Value Ref Range   Sodium 134 (L) 135 - 145 mmol/L   Potassium 3.8 3.5 - 5.1 mmol/L   Chloride 104 98 - 111 mmol/L   CO2 20 (L) 22 - 32 mmol/L   Glucose, Bld 133 (H) 70 - 99 mg/dL   BUN 22 8 - 23 mg/dL   Creatinine, Ser 1.61 (H) 0.44 - 1.00 mg/dL   Calcium 8.1 (L) 8.9 - 10.3 mg/dL   GFR, Estimated 50 (L) >60 mL/min   Anion gap 10 5 - 15  CBC     Status: Abnormal   Collection Time: 05/19/22  5:05 AM  Result Value Ref Range   WBC 3.5 (L) 4.0 - 10.5 K/uL   RBC 2.96 (L) 3.87 - 5.11 MIL/uL   Hemoglobin 8.9 (L) 12.0 - 15.0 g/dL   HCT 09.6 (L) 04.5 - 40.9 %   MCV 92.9 80.0 - 100.0 fL   MCH 30.1 26.0 - 34.0 pg   MCHC 32.4 30.0 - 36.0 g/dL   RDW 81.1 (H) 91.4 - 78.2 %   Platelets 80 (L) 150 - 400 K/uL   nRBC 0.9 (H) 0.0 - 0.2 %  Glucose, capillary     Status:  Abnormal   Collection Time: 05/19/22  8:00 AM  Result Value Ref Range   Glucose-Capillary 129 (H) 70 - 99 mg/dL  Glucose, capillary     Status: Abnormal   Collection Time: 05/19/22 11:33 AM  Result Value Ref Range   Glucose-Capillary 199 (H) 70 - 99 mg/dL    I have reviewed pertinent nursing notes, vitals, labs, and images as necessary. I have ordered labwork  to follow up on as indicated.  I have reviewed the last notes from staff over past 24 hours. I have discussed patient's care plan and test results with nursing staff, CM/SW, and other staff as appropriate.  Time spent: Greater than 50% of the 55 minute visit was spent in counseling/coordination of care for the patient as laid out in the A&P.   LOS: 1 day   Lewie Chamber, MD [facility] 05/19/2022, 2:25 PM

## 2022-05-19 NOTE — Assessment & Plan Note (Signed)
-   Continue SSI and CBG monitoring ?

## 2022-05-19 NOTE — TOC Progression Note (Signed)
Transition of Care [facility]) - Progression Note    Patient Details  Name: Heather Petty MRN: [id number] Date of Birth: 01-28-1951  Transition of Care [facility]) CM/SW Contact  Beckie Busing, RN Phone Number:[phone]  05/19/2022, 2:12 PM  Clinical Narrative:     Transition of Care [facility]) Screening Note   Patient Details  Name: Heather Petty Date of Birth: Mar 01, 1950   Transition of Care [facility]) CM/SW Contact:    Beckie Busing, RN Phone Number: 05/19/2022, 2:13 PM    Transition of Care Department [facility]) has reviewed patient and no TOC needs have been identified at this time. We will continue to monitor patient advancement through interdisciplinary progression rounds. If new patient transition needs arise, please place a TOC consult.           Expected Discharge Plan and Services                                               Social Determinants of Health (SDOH) Interventions SDOH Screenings   Food Insecurity: No Food Insecurity (02/26/2022)  Housing: Low Risk  (02/26/2022)  Transportation Needs: No Transportation Needs (02/26/2022)  Utilities: Not At Risk (02/26/2022)  Tobacco Use: Low Risk  (05/18/2022)    Readmission Risk Interventions     No data to display

## 2022-05-19 NOTE — Assessment & Plan Note (Addendum)
-   hypotensive previously; BP meds on hold

## 2022-05-20 DIAGNOSIS — D709 Neutropenia, unspecified: Secondary | ICD-10-CM | POA: Diagnosis not present

## 2022-05-20 DIAGNOSIS — Z95828 Presence of other vascular implants and grafts: Secondary | ICD-10-CM | POA: Diagnosis not present

## 2022-05-20 DIAGNOSIS — C8198 Hodgkin lymphoma, unspecified, lymph nodes of multiple sites: Secondary | ICD-10-CM | POA: Diagnosis not present

## 2022-05-20 DIAGNOSIS — R5081 Fever presenting with conditions classified elsewhere: Secondary | ICD-10-CM | POA: Diagnosis not present

## 2022-05-20 LAB — CBC WITH DIFFERENTIAL/PLATELET
Abs Immature Granulocytes: 0.18 10*3/uL — ABNORMAL HIGH (ref 0.00–0.07)
Basophils Absolute: 0 10*3/uL (ref 0.0–0.1)
Basophils Relative: 0 %
Eosinophils Absolute: 0.1 10*3/uL (ref 0.0–0.5)
Eosinophils Relative: 3 %
HCT: 22 % — ABNORMAL LOW (ref 36.0–46.0)
Hemoglobin: 7.2 g/dL — ABNORMAL LOW (ref 12.0–15.0)
Immature Granulocytes: 4 %
Lymphocytes Relative: 18 %
Lymphs Abs: 0.8 10*3/uL (ref 0.7–4.0)
MCH: 30.1 pg (ref 26.0–34.0)
MCHC: 32.7 g/dL (ref 30.0–36.0)
MCV: 92.1 fL (ref 80.0–100.0)
Monocytes Absolute: 0.6 10*3/uL (ref 0.1–1.0)
Monocytes Relative: 12 %
Neutro Abs: 2.9 10*3/uL (ref 1.7–7.7)
Neutrophils Relative %: 63 %
Platelets: 81 10*3/uL — ABNORMAL LOW (ref 150–400)
RBC: 2.39 MIL/uL — ABNORMAL LOW (ref 3.87–5.11)
RDW: 16.1 % — ABNORMAL HIGH (ref 11.5–15.5)
WBC: 4.6 10*3/uL (ref 4.0–10.5)
nRBC: 0.9 % — ABNORMAL HIGH (ref 0.0–0.2)

## 2022-05-20 LAB — BASIC METABOLIC PANEL WITH GFR
Anion gap: 9 (ref 5–15)
BUN: 23 mg/dL (ref 8–23)
CO2: 20 mmol/L — ABNORMAL LOW (ref 22–32)
Calcium: 8.2 mg/dL — ABNORMAL LOW (ref 8.9–10.3)
Chloride: 106 mmol/L (ref 98–111)
Creatinine, Ser: 0.89 mg/dL (ref 0.44–1.00)
GFR, Estimated: >60 mL/min
Glucose, Bld: 117 mg/dL — ABNORMAL HIGH (ref 70–99)
Potassium: 3.3 mmol/L — ABNORMAL LOW (ref 3.5–5.1)
Sodium: 135 mmol/L (ref 135–145)

## 2022-05-20 LAB — GLUCOSE, CAPILLARY
Glucose-Capillary: 97 mg/dL (ref 70–99)
Glucose-Capillary: 188 mg/dL — ABNORMAL HIGH (ref 70–99)
Glucose-Capillary: 166 mg/dL — ABNORMAL HIGH (ref 70–99)
Glucose-Capillary: 241 mg/dL — ABNORMAL HIGH (ref 70–99)

## 2022-05-20 LAB — CULTURE, BLOOD (ROUTINE X 2)

## 2022-05-20 LAB — MAGNESIUM: Magnesium: 1.3 mg/dL — ABNORMAL LOW (ref 1.7–2.4)

## 2022-05-20 MED ORDER — MAGNESIUM SULFATE 4 GM/100ML IV SOLN
4.0000 g | Freq: Once | INTRAVENOUS | Status: AC
  Administered 2022-05-20: 4 g via INTRAVENOUS
  Filled 2022-05-20: qty 100

## 2022-05-20 MED ORDER — POTASSIUM CHLORIDE CRYS ER 20 MEQ PO TBCR
40.0000 meq | EXTENDED_RELEASE_TABLET | Freq: Once | ORAL | Status: AC
  Administered 2022-05-20: 40 meq via ORAL
  Filled 2022-05-20: qty 2

## 2022-05-20 NOTE — Progress Notes (Signed)
Patient's temperature is 101.3. The RN has administered Tylenol to the patient and lowered the room temperature, but the patient refused to remove the extra blanket. The on-call provider has been notified. The RN will recheck the temperature in an hour.

## 2022-05-20 NOTE — Progress Notes (Signed)
Progress Note    Heather Petty   VQQ:[id number]  DOB: October 25, 1950  DOA: 05/18/2022     2 PCP: System, Provider Not In  Initial CC: fever, aches  Hospital Course: Ms. Selleck is a 72 yo female with PMH Hodgkin's lymphoma, schizophrenia, obesity, DM II, asthma who presented with fever from her nursing facility.  She also developed aching pain throughout.  Recent chemoinfusion on 05/09/2022. She also has a known history of suspected port infection and underwent removal on 02/28/2022 and completion of IV antibiotics for 2 weeks.  Port was reimplanted with radiology on 04/21/2022. On admission, there was some redness around her port site along with increased pain.  Fever on admission noted at 103.1.  Neutropenia also noted, WBC 1.3.  She was started on IV antibiotics, ID was consulted.  Interval History:  No events overnight.  Patient much more comfortable appearing this morning.  Did have some itching yesterday around 7 PM which responded to Atarax.  No obvious rash noted. Today she is able to answer questions easily and carry on conversation.  Aching that was generalized yesterday and appears to be resolved today.  Assessment and Plan: * Febrile neutropenia - Cycle 3 completed on 05/09/2022 (doxorubicin, vinblastine, dacarbazine) -Treated with PEG filgrastim on 05/11/2022 - nadir happening at this time -WBC already improving today, if does become severely neutropenic, will likely initiate Granix and discuss with oncology -Unclear source at this time.  History of suspected infected port and possibly similar again.  See [place] are clear, UA negative.  Blood cultures in process -Ports appears benign.  Follow-up cultures pending -ID following as well.  Continue vancomycin and cefepime  Port-A-Cath in place - See febrile neutropenia -Port reimplanted 04/21/2022  Hodgkin lymphoma - Stage III Hodgkin's lymphoma, diagnosed November 2023.  Follows with Dr. Leonides Schanz. -Continue allopurinol for TLS  prophylaxis  Hyponatremia - Mild and clinically insignificant - Continue fluids  Schizophrenia - Continue doxepin.  Previously had also been on Risperdal also but no longer on med rec  DM II (diabetes mellitus, type II), controlled - Continue SSI and CBG monitoring  GERD (gastroesophageal reflux disease) - continue pepcid  Hypotension - improved - BP meds on hold  HTN (hypertension) - hypotensive previously; BP meds on hold   Old records reviewed in assessment of this patient  Antimicrobials: Vancomycin 05/18/2022 >> current Cefepime 05/18/2022 >> current  DVT prophylaxis:  enoxaparin (LOVENOX) injection 40 mg Start: 05/18/22 2200 SCDs Start: 05/18/22 1503   Code Status:   Code Status: Full Code  Mobility Assessment (last 72 hours)     Mobility Assessment     Row Name 05/18/22 1955           Does patient have an order for bedrest or is patient medically unstable Yes- Bedfast (Level 1) - Complete                Barriers to discharge:  Disposition Plan: SNF Status is: Inpt  Objective: Blood pressure (!) 133/45, pulse 94, temperature 98.8 F (37.1 C), temperature source Oral, resp. rate 16, height  (1.575 m), weight 93.7 kg, SpO2 95 %.  Examination:  Physical Exam Constitutional:      Appearance: She is obese.     Comments: Awake, alert, and much more comfortable appearing.  No further moaning  HENT:     Head: Normocephalic and atraumatic.     Mouth/Throat:     Mouth: Mucous membranes are dry.  Eyes:     Extraocular Movements: Extraocular movements  intact.  Cardiovascular:     Rate and Rhythm: Normal rate and regular rhythm.  Pulmonary:     Effort: Pulmonary effort is normal. No respiratory distress.     Breath sounds: Normal breath sounds. No wheezing.  Chest:     Comments: Port-A-Cath in place with minimal tenderness. No erythema, fluctuance, or drainage Abdominal:     General: Bowel sounds are normal. There is no distension.      Palpations: Abdomen is soft.     Tenderness: There is no abdominal tenderness.  Musculoskeletal:        General: Normal range of motion.     Cervical back: Normal range of motion and neck supple.  Skin:    Comments: Diffuse vitiligo appreciated  Neurological:     General: No focal deficit present.  Psychiatric:        Mood and Affect: Mood normal.      Consultants:  ID  Procedures:    Data Reviewed: Results for orders placed or performed during the hospital encounter of 05/18/22 (from the past 24 hour(s))  Glucose, capillary     Status: Abnormal   Collection Time: 05/19/22  9:10 PM  Result Value Ref Range   Glucose-Capillary 135 (H) 70 - 99 mg/dL   Comment 1 Notify RN    Comment 2 Document in Chart   Basic metabolic panel     Status: Abnormal   Collection Time: 05/20/22  6:04 AM  Result Value Ref Range   Sodium 135 135 - 145 mmol/L   Potassium 3.3 (L) 3.5 - 5.1 mmol/L   Chloride 106 98 - 111 mmol/L   CO2 20 (L) 22 - 32 mmol/L   Glucose, Bld 117 (H) 70 - 99 mg/dL   BUN 23 8 - 23 mg/dL   Creatinine, Ser 1.61 0.44 - 1.00 mg/dL   Calcium 8.2 (L) 8.9 - 10.3 mg/dL   GFR, Estimated >09 >60 mL/min   Anion gap 9 5 - 15  CBC with Differential/Platelet     Status: Abnormal   Collection Time: 05/20/22  6:04 AM  Result Value Ref Range   WBC 4.6 4.0 - 10.5 K/uL   RBC 2.39 (L) 3.87 - 5.11 MIL/uL   Hemoglobin 7.2 (L) 12.0 - 15.0 g/dL   HCT 45.4 (L) 09.8 - 11.9 %   MCV 92.1 80.0 - 100.0 fL   MCH 30.1 26.0 - 34.0 pg   MCHC 32.7 30.0 - 36.0 g/dL   RDW 14.7 (H) 82.9 - 56.2 %   Platelets 81 (L) 150 - 400 K/uL   nRBC 0.9 (H) 0.0 - 0.2 %   Neutrophils Relative % 63 %   Neutro Abs 2.9 1.7 - 7.7 K/uL   Lymphocytes Relative 18 %   Lymphs Abs 0.8 0.7 - 4.0 K/uL   Monocytes Relative 12 %   Monocytes Absolute 0.6 0.1 - 1.0 K/uL   Eosinophils Relative 3 %   Eosinophils Absolute 0.1 0.0 - 0.5 K/uL   Basophils Relative 0 %   Basophils Absolute 0.0 0.0 - 0.1 K/uL   WBC Morphology DOHLE  BODIES    Immature Granulocytes 4 %   Abs Immature Granulocytes 0.18 (H) 0.00 - 0.07 K/uL   Polychromasia PRESENT   Magnesium     Status: Abnormal   Collection Time: 05/20/22  6:04 AM  Result Value Ref Range   Magnesium 1.3 (L) 1.7 - 2.4 mg/dL  Glucose, capillary     Status: None   Collection Time: 05/20/22  7:20 AM  Result Value Ref Range   Glucose-Capillary 97 70 - 99 mg/dL  Glucose, capillary     Status: Abnormal   Collection Time: 05/20/22 11:44 AM  Result Value Ref Range   Glucose-Capillary 188 (H) 70 - 99 mg/dL  Glucose, capillary     Status: Abnormal   Collection Time: 05/20/22  4:24 PM  Result Value Ref Range   Glucose-Capillary 166 (H) 70 - 99 mg/dL    I have reviewed pertinent nursing notes, vitals, labs, and images as necessary. I have ordered labwork to follow up on as indicated.  I have reviewed the last notes from staff over past 24 hours. I have discussed patient's care plan and test results with nursing staff, CM/SW, and other staff as appropriate.  Time spent: Greater than 50% of the 55 minute visit was spent in counseling/coordination of care for the patient as laid out in the A&P.   LOS: 2 days   Lewie Chamber, MD [facility] 05/20/2022, 4:50 PM

## 2022-05-21 DIAGNOSIS — Z95828 Presence of other vascular implants and grafts: Secondary | ICD-10-CM | POA: Diagnosis not present

## 2022-05-21 DIAGNOSIS — D709 Neutropenia, unspecified: Secondary | ICD-10-CM | POA: Diagnosis not present

## 2022-05-21 DIAGNOSIS — R5081 Fever presenting with conditions classified elsewhere: Secondary | ICD-10-CM | POA: Diagnosis not present

## 2022-05-21 DIAGNOSIS — C8198 Hodgkin lymphoma, unspecified, lymph nodes of multiple sites: Secondary | ICD-10-CM | POA: Diagnosis not present

## 2022-05-21 LAB — CBC WITH DIFFERENTIAL/PLATELET
Abs Immature Granulocytes: 0.47 10*3/uL — ABNORMAL HIGH (ref 0.00–0.07)
Basophils Absolute: 0 10*3/uL (ref 0.0–0.1)
Basophils Relative: 0 %
Eosinophils Absolute: 0.1 10*3/uL (ref 0.0–0.5)
Eosinophils Relative: 2 %
HCT: 23.2 % — ABNORMAL LOW (ref 36.0–46.0)
Hemoglobin: 7.6 g/dL — ABNORMAL LOW (ref 12.0–15.0)
Immature Granulocytes: 6 %
Lymphocytes Relative: 19 %
Lymphs Abs: 1.4 10*3/uL (ref 0.7–4.0)
MCH: 30.5 pg (ref 26.0–34.0)
MCHC: 32.8 g/dL (ref 30.0–36.0)
MCV: 93.2 fL (ref 80.0–100.0)
Monocytes Absolute: 1.1 10*3/uL — ABNORMAL HIGH (ref 0.1–1.0)
Monocytes Relative: 15 %
Neutro Abs: 4.3 10*3/uL (ref 1.7–7.7)
Neutrophils Relative %: 58 %
Platelets: 101 10*3/uL — ABNORMAL LOW (ref 150–400)
RBC: 2.49 MIL/uL — ABNORMAL LOW (ref 3.87–5.11)
RDW: 16.8 % — ABNORMAL HIGH (ref 11.5–15.5)
WBC: 7.4 10*3/uL (ref 4.0–10.5)
nRBC: 0.7 % — ABNORMAL HIGH (ref 0.0–0.2)

## 2022-05-21 LAB — BASIC METABOLIC PANEL
Anion gap: 7 (ref 5–15)
BUN: 17 mg/dL (ref 8–23)
CO2: 20 mmol/L — ABNORMAL LOW (ref 22–32)
Calcium: 8.2 mg/dL — ABNORMAL LOW (ref 8.9–10.3)
Chloride: 110 mmol/L (ref 98–111)
Creatinine, Ser: 0.73 mg/dL (ref 0.44–1.00)
GFR, Estimated: >60 mL/min (ref >60)
Glucose, Bld: 197 mg/dL — ABNORMAL HIGH (ref 70–99)
Potassium: 3.5 mmol/L (ref 3.5–5.1)
Sodium: 137 mmol/L (ref 135–145)

## 2022-05-21 LAB — MAGNESIUM: Magnesium: 2 mg/dL (ref 1.7–2.4)

## 2022-05-21 LAB — GLUCOSE, CAPILLARY
Glucose-Capillary: 182 mg/dL — ABNORMAL HIGH (ref 70–99)
Glucose-Capillary: 231 mg/dL — ABNORMAL HIGH (ref 70–99)
Glucose-Capillary: 228 mg/dL — ABNORMAL HIGH (ref 70–99)
Glucose-Capillary: 278 mg/dL — ABNORMAL HIGH (ref 70–99)

## 2022-05-21 MED ORDER — VANCOMYCIN HCL IN DEXTROSE 1-5 GM/200ML-% IV SOLN
1000.0000 mg | INTRAVENOUS | Status: DC
  Administered 2022-05-21 – 2022-05-23 (×3): 1000 mg via INTRAVENOUS
  Filled 2022-05-21 (×3): qty 200

## 2022-05-21 MED ORDER — SODIUM CHLORIDE 0.9 % IV SOLN
2.0000 g | Freq: Three times a day (TID) | INTRAVENOUS | Status: DC
  Administered 2022-05-21 – 2022-05-23 (×7): 2 g via INTRAVENOUS
  Filled 2022-05-21 (×8): qty 12.5

## 2022-05-21 NOTE — Evaluation (Signed)
Physical Therapy Evaluation Patient Details Name: Heather Petty MRN: [id number] DOB: 1951-01-11 Today's Date: 05/21/2022  History of Present Illness  72 yo female who presented with fever, pt with known hx of suspected port infection  with removal in Jan and redo of port-a-cath on 04/21/22. PMH Hodgkin's lymphoma, schizophrenia, obesity, DM II, asthma  Clinical Impression  Patient evaluated by Physical Therapy with no further acute PT needs identified. All education has been completed and the patient has no further questions.  Pt is a resident of [facility] for  2+ years, she reports requiring assist with ADLs, bed mobility and occasional bed to w/c transfers.   PT is signing off. Thank you for this referral.        Recommendations for follow up therapy are one component of a multi-disciplinary discharge planning process, led by the attending physician.  Recommendations may be updated based on patient status, additional functional criteria and insurance authorization.  Follow Up Recommendations Can patient physically be transported by private vehicle: No     Assistance Recommended at Discharge Intermittent Supervision/Assistance  Patient can return home with the following  Two people to help with walking and/or transfers;Two people to help with bathing/dressing/bathroom;Help with stairs or ramp for entrance;Assistance with cooking/housework;Assist for transportation    Equipment Recommendations    Recommendations for Other Services       Functional Status Assessment Patient has not had a recent decline in their functional status     Precautions / Restrictions Precautions Precautions: Fall Restrictions Weight Bearing Restrictions: No      Mobility  Bed Mobility               General bed mobility comments: pt declined OOB    Transfers                        Ambulation/Gait               General Gait Details: non amb at baseline  Stairs             Wheelchair Mobility    Modified Rankin (Stroke Patients Only)       Balance                                             Pertinent Vitals/Pain Pain Assessment Pain Assessment: Faces Faces Pain Scale: Hurts little more Pain Location: bil shoulders, bil knees Pain Descriptors / Indicators: Aching, Guarding Pain Intervention(s): Limited activity within patient's tolerance, Monitored during session, Premedicated before session, Repositioned    Home Living Family/patient expects to be discharged to:: Skilled nursing facility                   Additional Comments: pt is a resident at [facility]    Prior Function Prior Level of Function : Needs assist       Physical Assist : ADLs (physical);Mobility (physical) Mobility (physical): Bed mobility;Transfers   Mobility Comments: pt reports she requires assist for bed mobility and occasional scooting transfers; non amb d/t knees-pain and weakness ADLs Comments: assist with bathing and dressing     Hand Dominance        Extremity/Trunk Assessment   Upper Extremity Assessment Upper Extremity Assessment: RUE deficits/detail;LUE deficits/detail RUE Deficits / Details: ltd to ~ 70 degrees active flexion,  elbow/wrist hand   grossly WFL fro AROM;  strength 3 to 3+/5 LUE Deficits / Details: ltd to ~ 70 degrees active flexion, elbow/wrist hand grossly WFL fro AROM; strength 3 to 3+/5    Lower Extremity Assessment Lower Extremity Assessment: RLE deficits/detail;LLE deficits/detail RLE Deficits / Details: unable to flex knee > 30 degrees d/t pain, hip adduction/abduction AAROM grossly WFL; strength 2 to 2+/5 grossly LLE Deficits / Details: unable to flex knee >10 degrees d/t pain; hip abd/adduction AAROM grossly WFL;  strength 2 to 2+/5       Communication   Communication: No difficulties  Cognition Arousal/Alertness: Awake/alert Behavior During Therapy: WFL for tasks  assessed/performed Overall Cognitive Status: Within Functional Limits for tasks assessed                                          General Comments      Exercises     Assessment/Plan    PT Assessment Patient needs continued PT services  PT Problem List         PT Treatment Interventions      PT Goals (Current goals can be found in the Care Plan section)  Acute Rehab PT Goals PT Goal Formulation: All assessment and education complete, DC therapy    Frequency       Co-evaluation               AM-PAC PT "6 Clicks" Mobility  Outcome Measure Help needed turning from your back to your side while in a flat bed without using bedrails?: Total Help needed moving from lying on your back to sitting on the side of a flat bed without using bedrails?: Total Help needed moving to and from a bed to a chair (including a wheelchair)?: Total Help needed standing up from a chair using your arms (e.g., wheelchair or bedside chair)?: Total Help needed to walk in hospital room?: Total Help needed climbing 3-5 steps with a railing? : Total 6 Click Score: 6    End of Session   Activity Tolerance: Patient tolerated treatment well Patient left: with call bell/phone within reach;in bed;with bed alarm set   PT Visit Diagnosis: Muscle weakness (generalized) (M62.81)    Time: 1610-9604 PT Time Calculation (min) (ACUTE ONLY): 11 min   Charges:   PT Evaluation $PT Eval Low Complexity: 1 Low          Jorell Agne, PT  Acute Rehab Dept ([facility]) [phone]  05/21/2022   [facility] 05/21/2022, 4:16 PM

## 2022-05-21 NOTE — Progress Notes (Signed)
Pharmacy Antibiotic Note  Heather Petty is a 72 y.o. female admitted on 05/18/2022 with fever.Patient is on chemotherapy, last received treatment 4/2. She received peg-filgrastim 4/4. Patient is neutropenic with self-reported fever PTA. Pharmacy has been consulted for vancomycin dosing for suspected cellulitis at the port.  05/21/2022 D#4 vanc/cefepime  Tmax 101.3; WBC up to 7.4.( PEG filgrastim 05/11/22)  SCr down to 0.73 All cultures no growth to date  Plan: -Increase vancomycin to 1000 mg IV q24h, est AUC 469; Scr round up to 0.8. Vd 0.5 -Increase Cefepime 2 g IV q8h -Follow renal function, cultures and clinical progress for dose adjustments and de-escalation as indicated  Height: 5\' 2"  (157.5 cm) Weight: 93.7 kg (206 lb 9.1 oz) IBW/kg (Calculated) : 50.1  Temp (24hrs), Avg:99.7 F (37.6 C), Min:98.3 F (36.8 C), Max:101.3 F (38.5 C)  Recent Labs  Lab 05/18/22 1248 05/19/22 0505 05/20/22 0604 05/21/22 0619  WBC 1.3* 3.5* 4.6 7.4  CREATININE 1.00 1.15* 0.89 0.73  LATICACIDVEN 1.4  --   --   --      Estimated Creatinine Clearance: 66.7 mL/min (by C-G formula based on SCr of 0.73 mg/dL).    Allergies  Allergen Reactions   Emend [Aprepitant] Other (See Comments)    Pt experienced shooting back pain at 10/10 when medication was administered the first time. See Hypersensitivity note from 01/17/2022.   Vancomycin Itching and Other (See Comments)    Patient becomes hypotensive, lethargic and itchy. Has tolerated it many times.   Fosaprepitant Other (See Comments)    Back pain / hypersensitivity 01/17/2022   Lasix [Furosemide] Other (See Comments)    IV-LASIX ==> Reaction: Burning   Lasix [Furosemide] Other (See Comments)    On MAR as allergic   Pineapple Rash    Antimicrobials this admission: Vancomycin 4/11 >> Cefepime 4/11 >>  Microbiology results: 4/11 blood x 2 PAC : ngtd 4/12 MRSA + 4/12 BCXx2 peripheral: ngtd 4/13 C diff:  4/13 GI panel:    Thank you  for allowing pharmacy to be a part of this patient's care.  Herby Abraham, Pharm.D Use secure chat for questions 05/21/2022 12:18 PM

## 2022-05-21 NOTE — Progress Notes (Signed)
Heather Petty is from [facility]. sHE ALSO REFUSES TO WEAR scd'S.

## 2022-05-21 NOTE — Progress Notes (Signed)
Progress Note    Heather Petty   ELT:[id number]  DOB: December 13, 1950  DOA: 05/18/2022     3 PCP: System, Provider Not In  Initial CC: fever, aches  Hospital Course: Heather Petty is a 72 yo female with PMH Hodgkin's lymphoma, schizophrenia, obesity, DM II, asthma who presented with fever from her nursing facility.  She also developed aching pain throughout.  Recent chemoinfusion on 05/09/2022. She also has a known history of suspected port infection and underwent removal on 02/28/2022 and completion of IV antibiotics for 2 weeks.  Port was reimplanted with radiology on 04/21/2022. On admission, there was some redness around her port site along with increased pain.  Fever on admission noted at 103.1.  Neutropenia also noted, WBC 1.3.  She was started on IV antibiotics, ID was consulted.  Interval History:  No events overnight.  Comfortably resting in bed.  Still having some discomfort over her port site but states this has been present ever since it was reimplanted and is not a new finding associated with admission.  Otherwise had no other concerns.  Assessment and Plan: * Febrile neutropenia - Cycle 3 completed on 05/09/2022 (doxorubicin, vinblastine, dacarbazine) -Treated with PEG filgrastim on 05/11/2022 - nadir happening at this time -WBC already improving today, if does become severely neutropenic, will likely initiate Granix and discuss with oncology -Unclear source at this time.  History of suspected infected port and possibly similar again. CXR clear. UA negative.  Blood cultures in process and remaining negative from port and peripheral  -Port appears benign.  -ID following as well.  Continue vancomycin and cefepime  Port-A-Cath in place - See febrile neutropenia -Port reimplanted 04/21/2022  Hodgkin lymphoma - Stage III Hodgkin's lymphoma, diagnosed November 2023.  Follows with Heather Petty. -Continue allopurinol for TLS prophylaxis  Hyponatremia - Mild and clinically  insignificant - Continue fluids  Schizophrenia - Continue doxepin.  Previously had also been on Risperdal also but no longer on med rec  DM II (diabetes mellitus, type II), controlled - Continue SSI and CBG monitoring  GERD (gastroesophageal reflux disease) - continue pepcid  Hypotension - improved - BP meds on hold  HTN (hypertension) - hypotensive previously; BP meds on hold   Old records reviewed in assessment of this patient  Antimicrobials: Vancomycin 05/18/2022 >> current Cefepime 05/18/2022 >> current  DVT prophylaxis:  enoxaparin (LOVENOX) injection 40 mg Start: 05/18/22 2200 SCDs Start: 05/18/22 1503   Code Status:   Code Status: Full Code  Mobility Assessment (last 72 hours)     Mobility Assessment     Row Name 05/20/22 2300 05/18/22 1955         Does patient have an order for bedrest or is patient medically unstable Yes- Bedfast (Level 1) - Complete Yes- Bedfast (Level 1) - Complete               Barriers to discharge:  Disposition Plan: SNF Status is: Inpt  Objective: Blood pressure (!) 140/59, pulse 87, temperature 98.4 F (36.9 C), temperature source Oral, resp. rate 18, height 5\' 2"  (1.575 m), weight 93.7 kg, SpO2 94 %.  Examination:  Physical Exam Constitutional:      Appearance: She is obese.     Comments: Awake, alert, and much more comfortable appearing.  No further moaning  HENT:     Head: Normocephalic and atraumatic.     Mouth/Throat:     Mouth: Mucous membranes are dry.  Eyes:     Extraocular Movements: Extraocular movements intact.  Cardiovascular:  Rate and Rhythm: Normal rate and regular rhythm.  Pulmonary:     Effort: Pulmonary effort is normal. No respiratory distress.     Breath sounds: Normal breath sounds. No wheezing.  Chest:     Comments: Port-A-Cath in place with minimal tenderness. No erythema, fluctuance, or drainage Abdominal:     General: Bowel sounds are normal. There is no distension.     Palpations:  Abdomen is soft.     Tenderness: There is no abdominal tenderness.  Musculoskeletal:        General: Normal range of motion.     Cervical back: Normal range of motion and neck supple.  Skin:    Comments: Diffuse vitiligo appreciated  Neurological:     General: No focal deficit present.  Psychiatric:        Mood and Affect: Mood normal.      Consultants:  ID  Procedures:    Data Reviewed: Results for orders placed or performed during the hospital encounter of 05/18/22 (from the past 24 hour(s))  Glucose, capillary     Status: Abnormal   Collection Time: 05/20/22  4:24 PM  Result Value Ref Range   Glucose-Capillary 166 (H) 70 - 99 mg/dL  Glucose, capillary     Status: Abnormal   Collection Time: 05/20/22  8:39 PM  Result Value Ref Range   Glucose-Capillary 241 (H) 70 - 99 mg/dL  Basic metabolic panel     Status: Abnormal   Collection Time: 05/21/22  6:19 AM  Result Value Ref Range   Sodium 137 135 - 145 mmol/L   Potassium 3.5 3.5 - 5.1 mmol/L   Chloride 110 98 - 111 mmol/L   CO2 20 (L) 22 - 32 mmol/L   Glucose, Bld 197 (H) 70 - 99 mg/dL   BUN 17 8 - 23 mg/dL   Creatinine, Ser 9.56 0.44 - 1.00 mg/dL   Calcium 8.2 (L) 8.9 - 10.3 mg/dL   GFR, Estimated >21 >30 mL/min   Anion gap 7 5 - 15  CBC with Differential/Platelet     Status: Abnormal   Collection Time: 05/21/22  6:19 AM  Result Value Ref Range   WBC 7.4 4.0 - 10.5 K/uL   RBC 2.49 (L) 3.87 - 5.11 MIL/uL   Hemoglobin 7.6 (L) 12.0 - 15.0 g/dL   HCT 86.5 (L) 78.4 - 69.6 %   MCV 93.2 80.0 - 100.0 fL   MCH 30.5 26.0 - 34.0 pg   MCHC 32.8 30.0 - 36.0 g/dL   RDW 29.5 (H) 28.4 - 13.2 %   Platelets 101 (L) 150 - 400 K/uL   nRBC 0.7 (H) 0.0 - 0.2 %   Neutrophils Relative % 58 %   Neutro Abs 4.3 1.7 - 7.7 K/uL   Lymphocytes Relative 19 %   Lymphs Abs 1.4 0.7 - 4.0 K/uL   Monocytes Relative 15 %   Monocytes Absolute 1.1 (H) 0.1 - 1.0 K/uL   Eosinophils Relative 2 %   Eosinophils Absolute 0.1 0.0 - 0.5 K/uL    Basophils Relative 0 %   Basophils Absolute 0.0 0.0 - 0.1 K/uL   WBC Morphology DOHLE BODIES    Immature Granulocytes 6 %   Abs Immature Granulocytes 0.47 (H) 0.00 - 0.07 K/uL   Polychromasia PRESENT   Magnesium     Status: None   Collection Time: 05/21/22  6:19 AM  Result Value Ref Range   Magnesium 2.0 1.7 - 2.4 mg/dL  Glucose, capillary     Status: Abnormal  Collection Time: 05/21/22  7:43 AM  Result Value Ref Range   Glucose-Capillary 182 (H) 70 - 99 mg/dL  Glucose, capillary     Status: Abnormal   Collection Time: 05/21/22 11:18 AM  Result Value Ref Range   Glucose-Capillary 231 (H) 70 - 99 mg/dL    I have reviewed pertinent nursing notes, vitals, labs, and images as necessary. I have ordered labwork to follow up on as indicated.  I have reviewed the last notes from staff over past 24 hours. I have discussed patient's care plan and test results with nursing staff, CM/SW, and other staff as appropriate.  Time spent: Greater than 50% of the 55 minute visit was spent in counseling/coordination of care for the patient as laid out in the A&P.   LOS: 3 days   Lewie Chamber, MD [facility] 05/21/2022, 4:13 PM

## 2022-05-22 ENCOUNTER — Other Ambulatory Visit: Payer: Self-pay | Admitting: Hematology and Oncology

## 2022-05-22 ENCOUNTER — Inpatient Hospital Stay (HOSPITAL_COMMUNITY): Payer: Medicare Other

## 2022-05-22 DIAGNOSIS — C8190 Hodgkin lymphoma, unspecified, unspecified site: Secondary | ICD-10-CM | POA: Diagnosis not present

## 2022-05-22 DIAGNOSIS — D709 Neutropenia, unspecified: Secondary | ICD-10-CM | POA: Diagnosis not present

## 2022-05-22 DIAGNOSIS — N179 Acute kidney failure, unspecified: Secondary | ICD-10-CM | POA: Diagnosis not present

## 2022-05-22 DIAGNOSIS — R5081 Fever presenting with conditions classified elsewhere: Secondary | ICD-10-CM | POA: Diagnosis not present

## 2022-05-22 DIAGNOSIS — Z95828 Presence of other vascular implants and grafts: Secondary | ICD-10-CM | POA: Diagnosis not present

## 2022-05-22 DIAGNOSIS — C8198 Hodgkin lymphoma, unspecified, lymph nodes of multiple sites: Secondary | ICD-10-CM | POA: Diagnosis not present

## 2022-05-22 LAB — CBC WITH DIFFERENTIAL/PLATELET
Abs Immature Granulocytes: 0.47 10*3/uL — ABNORMAL HIGH (ref 0.00–0.07)
Basophils Absolute: 0 10*3/uL (ref 0.0–0.1)
Basophils Relative: 0 %
Eosinophils Absolute: 0.1 10*3/uL (ref 0.0–0.5)
Eosinophils Relative: 1 %
HCT: 22.2 % — ABNORMAL LOW (ref 36.0–46.0)
Hemoglobin: 7.1 g/dL — ABNORMAL LOW (ref 12.0–15.0)
Immature Granulocytes: 6 %
Lymphocytes Relative: 23 %
Lymphs Abs: 1.8 10*3/uL (ref 0.7–4.0)
MCH: 30 pg (ref 26.0–34.0)
MCHC: 32 g/dL (ref 30.0–36.0)
MCV: 93.7 fL (ref 80.0–100.0)
Monocytes Absolute: 1.1 10*3/uL — ABNORMAL HIGH (ref 0.1–1.0)
Monocytes Relative: 14 %
Neutro Abs: 4.5 10*3/uL (ref 1.7–7.7)
Neutrophils Relative %: 56 %
Platelets: 103 10*3/uL — ABNORMAL LOW (ref 150–400)
RBC: 2.37 MIL/uL — ABNORMAL LOW (ref 3.87–5.11)
RDW: 17.3 % — ABNORMAL HIGH (ref 11.5–15.5)
WBC: 8 10*3/uL (ref 4.0–10.5)
nRBC: 0.6 % — ABNORMAL HIGH (ref 0.0–0.2)

## 2022-05-22 LAB — BASIC METABOLIC PANEL
Anion gap: 8 (ref 5–15)
BUN: 11 mg/dL (ref 8–23)
CO2: 22 mmol/L (ref 22–32)
Calcium: 7.9 mg/dL — ABNORMAL LOW (ref 8.9–10.3)
Chloride: 106 mmol/L (ref 98–111)
Creatinine, Ser: 0.66 mg/dL (ref 0.44–1.00)
GFR, Estimated: >60 mL/min (ref >60)
Glucose, Bld: 220 mg/dL — ABNORMAL HIGH (ref 70–99)
Potassium: 3.1 mmol/L — ABNORMAL LOW (ref 3.5–5.1)
Sodium: 136 mmol/L (ref 135–145)

## 2022-05-22 LAB — CULTURE, BLOOD (ROUTINE X 2): Culture: NO GROWTH

## 2022-05-22 LAB — GLUCOSE, CAPILLARY
Glucose-Capillary: 216 mg/dL — ABNORMAL HIGH (ref 70–99)
Glucose-Capillary: 225 mg/dL — ABNORMAL HIGH (ref 70–99)
Glucose-Capillary: 237 mg/dL — ABNORMAL HIGH (ref 70–99)
Glucose-Capillary: 231 mg/dL — ABNORMAL HIGH (ref 70–99)

## 2022-05-22 LAB — MAGNESIUM: Magnesium: 1.7 mg/dL (ref 1.7–2.4)

## 2022-05-22 MED ORDER — POTASSIUM CHLORIDE CRYS ER 20 MEQ PO TBCR
40.0000 meq | EXTENDED_RELEASE_TABLET | Freq: Once | ORAL | Status: AC
  Administered 2022-05-22: 40 meq via ORAL
  Filled 2022-05-22: qty 2

## 2022-05-22 MED ORDER — MUPIROCIN 2 % EX OINT
1.0000 | TOPICAL_OINTMENT | Freq: Two times a day (BID) | CUTANEOUS | Status: DC
  Administered 2022-05-22 – 2022-05-24 (×5): 1 via NASAL
  Filled 2022-05-22: qty 22

## 2022-05-22 NOTE — Progress Notes (Signed)
Hematology/Oncology Progress Note  Clinical Summary: [name] is a 72 year old female with medical history significant for Hodgkin's lymphoma who is currently admitted with neutropenic fever.   Interval History: -- Chest x-ray, urine culture, and blood cultures currently negative.  Suspicion for possible port infection.  Ultrasound ordered for port area due to lesion  -- White blood cell counts recovered with normal ANC. -- Platelets recovering but hemoglobin remains quite low, currently at hemoglobin 7.1 -- No fevers, chills, sweats, nausea, vomiting or diarrhea in the last 48 hours. -- Next round of chemotherapy scheduled for tomorrow.  Canceled and will plan to resume chemotherapy on 06/06/2022 as scheduled.  O:  Vitals:   05/22/22 0535 05/22/22 1406  BP: (!) 145/60 (!) 146/56  Pulse: 91 89  Resp: 16 15  Temp: 98.6 F (37 C) 98.5 F (36.9 C)  SpO2: 93% 96%      Latest Ref Rng & Units 05/22/2022    6:15 AM 05/21/2022    6:19 AM 05/20/2022    6:04 AM  CMP  Glucose 70 - 99 mg/dL 748  270  786   BUN 8 - 23 mg/dL 11  17  23    Creatinine 0.44 - 1.00 mg/dL 7.54  4.92  0.10   Sodium 135 - 145 mmol/L 136  137  135   Potassium 3.5 - 5.1 mmol/L 3.1  3.5  3.3   Chloride 98 - 111 mmol/L 106  110  106   CO2 22 - 32 mmol/L 22  20  20    Calcium 8.9 - 10.3 mg/dL 7.9  8.2  8.2       Latest Ref Rng & Units 05/22/2022    6:15 AM 05/21/2022    6:19 AM 05/20/2022    6:04 AM  CBC  WBC 4.0 - 10.5 K/uL 8.0  7.4  4.6   Hemoglobin 12.0 - 15.0 g/dL 7.1  7.6  7.2   Hematocrit 36.0 - 46.0 % 22.2  23.2  22.0   Platelets 150 - 400 K/uL 103  101  81       GENERAL: chronically ill appearing elderly female in NAD  SKIN: dry peeling skin on face and hands. Otherwise, skin color, texture, turgor are normal, no rashes or significant lesions EYES: conjunctiva are pink and non-injected, sclera clear LUNGS: clear to auscultation and percussion with normal breathing effort HEART: regular rate &  rhythm and no murmurs and no lower extremity edema ABDOMEN: soft, non-tender, non-distended, normal bowel sounds Musculoskeletal: no cyanosis of digits and no clubbing  PSYCH: alert & oriented x 3, fluent speech NEURO: no focal motor/sensory deficits  Assessment/Plan:  #Hodgkin's Lymphoma, Stage III --biopsy of abdominal lymph node on 12/27/2021 showed classical Hodgkin's lymphoma.  --Pre-treatment PET scan from [facility] abdominal adenopathy along with mild hypermetabolism involving mediastinal and bilateral axillary nodes. --Recommend AVD chemotherapy x 2 cycles followed by PET CT scan.  PLAN: -- Tomorrow is next scheduled AVD chemotherapy.  Will need to hold this as she is currently in the inpatient setting.  Will plan to resume chemotherapy on 06/06/2022 --port placed in the interim since last visit, but there is concern for port infection.  Considering removal. --Labs from today show white blood cell 8.0, hemoglobin 7.1, MCV 93.7, and platelets of 103 --RTC on 06/06/2022 to resume chemotherapy   #Neutropenic Fever -- Appreciate assistance of infectious disease and hospitalist team. -- Neutrophil count has normalized at this time.  Patient has remained afebrile. -- No source of infection identified  with chest x-ray, urine culture, or blood cultures -- Continues on Vanco and cefepime.   Ulysees Barns, MD Department of Hematology/Oncology [facility] Phone: [phone] Pager: [phone] Email: [email or web address]

## 2022-05-22 NOTE — TOC Progression Note (Addendum)
Transition of Care [facility]) - Progression Note    Patient Details  Name: Heather Petty MRN: [id number] Date of Birth: 25-Mar-1950  [facility]) CM/SW Contact  Beckie Busing, RN Phone Number:[phone]  05/22/2022, 1:35 PM  Clinical Narrative:    [facility] acknowledges consult for SNF placement. Patient is from [facility]. CM called Arna Medici with admissions at [facility] to verify that patient will be able to return to facility when medically cleared. Marcelino Duster does not answer at this time. CM has left voicemail.   1547 CM received return call from Arna Medici to confirm that patient is a long term care resident at [facility] and she can return to facility when medically stable.        Expected Discharge Plan and Services                                               Social Determinants of Health (SDOH) Interventions SDOH Screenings   Food Insecurity: No Food Insecurity (05/20/2022)  Housing: Low Risk  (05/20/2022)  Transportation Needs: No Transportation Needs (05/20/2022)  Utilities: Not At Risk (05/20/2022)  Tobacco Use: Low Risk  (05/18/2022)    Readmission Risk Interventions     No data to display

## 2022-05-22 NOTE — Progress Notes (Signed)
Progress Note    Heather KJayni [id number]  DOB: 1951-01-19  DOA: 05/18/2022     4 PCP: System, Provider Not In  Initial CC: fever, aches  Hospital Course: [place] is a 72 yo female with PMH Hodgkin's lymphoma, schizophrenia, obesity, DM II, asthma who presented with fever from her nursing facility.  She also developed aching pain throughout.  Recent chemoinfusion on 05/09/2022. She also has a known history of suspected port infection and underwent removal on 02/28/2022 and completion of IV antibiotics for 2 weeks.  Port was reimplanted with radiology on 04/21/2022. On admission, there was some redness around her port site along with increased pain.  Fever on admission noted at 103.1.  Neutropenia also noted, WBC 1.3.  She was started on IV antibiotics, ID was consulted.  Interval History:  No events overnight.  Port site seems a little more tender today.  She also is having some actual erythema now appreciated with a small scab at the top site of her port.  Otherwise no other changes clinically.  Assessment and Plan: * Febrile neutropenia - Cycle 3 completed on 05/09/2022 (doxorubicin, vinblastine, dacarbazine) -Treated with PEG filgrastim on 05/11/2022 - nadir happening at this time -WBC already improving today, if does become severely neutropenic, will likely initiate Granix and discuss with oncology -Unclear source at this time.  History of suspected infected port and possibly similar again. CXR clear. UA negative.  Blood cultures in process and remaining negative from port and peripheral  -Port appeared benign, but 4/15 started to have a mild erythema appearance and a small scab at home of site; she does also state it's more painful today unfortunately too -ID following as well.  Continue vancomycin and cefepime - follow up chest u/s to evaluate port   Port-A-Cath in place - See febrile neutropenia -Port reimplanted 04/21/2022  Hodgkin lymphoma - Stage III Hodgkin's  lymphoma, diagnosed November 2023.  Follows with [place]. -Continue allopurinol for TLS prophylaxis  Hyponatremia - Mild and clinically insignificant - Continue fluids  Schizophrenia - Continue doxepin.  Previously had also been on Risperdal also but no longer on med rec  DM II (diabetes mellitus, type II), controlled - Continue SSI and CBG monitoring  GERD (gastroesophageal reflux disease) - continue pepcid  Hypotension - improved - BP meds on hold  HTN (hypertension) - hypotensive previously; BP meds on hold   Old records reviewed in assessment of this patient  Antimicrobials: Vancomycin 05/18/2022 >> current Cefepime 05/18/2022 >> current  DVT prophylaxis:  enoxaparin (LOVENOX) injection 40 mg Start: 05/18/22 2200 SCDs Start: 05/18/22 1503   Code Status:   Code Status: Full Code  Mobility Assessment (last 72 hours)     Mobility Assessment     Row Name 05/22/22 0815 05/21/22 2300 05/21/22 1614 05/21/22 1217 05/20/22 2300   Does patient have an order for bedrest or is patient medically unstable Yes- Bedfast (Level 1) - Complete Yes- Bedfast (Level 1) - Complete -- Yes- Bedfast (Level 1) - Complete Yes- Bedfast (Level 1) - Complete   What is the highest level of mobility based on the progressive mobility assessment? Level 1 (Bedfast) - Unable to balance while sitting on edge of bed Level 1 (Bedfast) - Unable to balance while sitting on edge of bed Level 1 (Bedfast) - Unable to balance while sitting on edge of bed Level 1 (Bedfast) - Unable to balance while sitting on edge of bed --   Is the above level different from baseline mobility  prior to current illness? Yes - Recommend PT order Yes - Recommend PT order -- -- --            Barriers to discharge:  Disposition Plan: SNF Status is: Inpt  Objective: Blood pressure (!) 146/56, pulse 89, temperature 98.5 F (36.9 C), temperature source Oral, resp. rate 15, height  (1.575 m), weight 93.7 kg, SpO2 96 %.   Examination:  Physical Exam Constitutional:      Appearance: She is obese.     Comments: Awake, alert, and much more comfortable appearing.  No further moaning  HENT:     Head: Normocephalic and atraumatic.     Mouth/Throat:     Mouth: Mucous membranes are moist.  Eyes:     Extraocular Movements: Extraocular movements intact.  Cardiovascular:     Rate and Rhythm: Normal rate and regular rhythm.  Pulmonary:     Effort: Pulmonary effort is normal. No respiratory distress.     Breath sounds: Normal breath sounds. No wheezing.  Chest:     Comments: Port-A-Cath in place with more TTP today; some scant erythema and a subtle scab at the top Abdominal:     General: Bowel sounds are normal. There is no distension.     Palpations: Abdomen is soft.     Tenderness: There is no abdominal tenderness.  Musculoskeletal:        General: Normal range of motion.     Cervical back: Normal range of motion and neck supple.  Skin:    Comments: Diffuse vitiligo appreciated  Neurological:     General: No focal deficit present.  Psychiatric:        Mood and Affect: Mood normal.      Consultants:  ID  Procedures:    Data Reviewed: Results for orders placed or performed during the hospital encounter of 05/18/22 (from the past 24 hour(s))  Glucose, capillary     Status: Abnormal   Collection Time: 05/21/22  4:27 PM  Result Value Ref Range   Glucose-Capillary 228 (H) 70 - 99 mg/dL  Glucose, capillary     Status: Abnormal   Collection Time: 05/21/22 10:00 PM  Result Value Ref Range   Glucose-Capillary 278 (H) 70 - 99 mg/dL   Comment 1 Notify RN    Comment 2 Document in Chart   Basic metabolic panel     Status: Abnormal   Collection Time: 05/22/22  6:15 AM  Result Value Ref Range   Sodium 136 135 - 145 mmol/L   Potassium 3.1 (L) 3.5 - 5.1 mmol/L   Chloride 106 98 - 111 mmol/L   CO2 22 22 - 32 mmol/L   Glucose, Bld 220 (H) 70 - 99 mg/dL   BUN 11 8 - 23 mg/dL   Creatinine, Ser 1.61 0.44  - 1.00 mg/dL   Calcium 7.9 (L) 8.9 - 10.3 mg/dL   GFR, Estimated >09 >60 mL/min   Anion gap 8 5 - 15  CBC with Differential/Platelet     Status: Abnormal   Collection Time: 05/22/22  6:15 AM  Result Value Ref Range   WBC 8.0 4.0 - 10.5 K/uL   RBC 2.37 (L) 3.87 - 5.11 MIL/uL   Hemoglobin 7.1 (L) 12.0 - 15.0 g/dL   HCT 45.4 (L) 09.8 - 11.9 %   MCV 93.7 80.0 - 100.0 fL   MCH 30.0 26.0 - 34.0 pg   MCHC 32.0 30.0 - 36.0 g/dL   RDW 14.7 (H) 82.9 - 56.2 %  Platelets 103 (L) 150 - 400 K/uL   nRBC 0.6 (H) 0.0 - 0.2 %   Neutrophils Relative % 56 %   Neutro Abs 4.5 1.7 - 7.7 K/uL   Lymphocytes Relative 23 %   Lymphs Abs 1.8 0.7 - 4.0 K/uL   Monocytes Relative 14 %   Monocytes Absolute 1.1 (H) 0.1 - 1.0 K/uL   Eosinophils Relative 1 %   Eosinophils Absolute 0.1 0.0 - 0.5 K/uL   Basophils Relative 0 %   Basophils Absolute 0.0 0.0 - 0.1 K/uL   WBC Morphology DOHLE BODIES    Immature Granulocytes 6 %   Abs Immature Granulocytes 0.47 (H) 0.00 - 0.07 K/uL   Polychromasia PRESENT   Magnesium     Status: None   Collection Time: 05/22/22  6:15 AM  Result Value Ref Range   Magnesium 1.7 1.7 - 2.4 mg/dL  Glucose, capillary     Status: Abnormal   Collection Time: 05/22/22  8:01 AM  Result Value Ref Range   Glucose-Capillary 216 (H) 70 - 99 mg/dL  Glucose, capillary     Status: Abnormal   Collection Time: 05/22/22 12:05 PM  Result Value Ref Range   Glucose-Capillary 225 (H) 70 - 99 mg/dL   Comment 1 Notify RN    Comment 2 Document in Chart     I have reviewed pertinent nursing notes, vitals, labs, and images as necessary. I have ordered labwork to follow up on as indicated.  I have reviewed the last notes from staff over past 24 hours. I have discussed patient's care plan and test results with nursing staff, CM/SW, and other staff as appropriate.  Time spent: Greater than 50% of the 55 minute visit was spent in counseling/coordination of care for the patient as laid out in the A&P.    LOS: 4 days   Lewie Chamber, MD [facility] 05/22/2022, 3:19 PM

## 2022-05-22 NOTE — Inpatient Diabetes Management (Signed)
Inpatient Diabetes Program Recommendations  AACE/ADA: New Consensus Statement on Inpatient Glycemic Control (2015)  Target Ranges:  Prepandial:   less than 140 mg/dL      Peak postprandial:   less than 180 mg/dL (1-2 hours)      Critically ill patients:  140 - 180 mg/dL   Lab Results  Component Value Date   GLUCAP 216 (H) 05/22/2022   HGBA1C 10.0 (H) 02/10/2022    Review of Glycemic Control  Latest Reference Range & Units 05/21/22 07:43 05/21/22 11:18 05/21/22 16:27 05/21/22 22:00 05/22/22 08:01  Glucose-Capillary 70 - 99 mg/dL 827 (H) 078 (H) 675 (H) 278 (H) 216 (H)  (H): Data is abnormally high  Diabetes history: DM2 Outpatient Diabetes medications: Lantus 42 units QAM, and 28 units QHS, Novolog 0-12 units TID and Glipizide 10 mg BID Current orders for Inpatient glycemic control: Novolog 0-15 units TID and 0-5 units QHS  Inpatient Diabetes Program Recommendations:    Semglee 14 units QD (0.15 units x 93.7 kg)  Will continue to follow while inpatient.  Thank you, Dulce Sellar, MSN, CDCES Diabetes Coordinator Inpatient Diabetes Program [phone] (team pager from 8a-5p)

## 2022-05-22 NOTE — Progress Notes (Signed)
[facility]   Reason for visit: Follow up on fever  Interval History: fever curve trending down; she is having significant pain in the port-a-cath site.   Day 5 total antibiotics  Physical Exam: Constitutional:  Vitals:   05/21/22 2155 05/22/22 0535  BP: (!) 119/49 (!) 145/60  Pulse: 96 91  Resp: 18 16  Temp: 99.2 F (37.3 C) 98.6 F (37 C)  SpO2: 98% 93%   patient appears in NAD HENT: no thrush Chest: port-a-cath site with some increase in erythema, mild fluctuance, no drainage Respiratory: Normal respiratory effort  Review of Systems: Constitutional: negative for chills  Lab Results  Component Value Date   WBC 8.0 05/22/2022   HGB 7.1 (L) 05/22/2022   HCT 22.2 (L) 05/22/2022   MCV 93.7 05/22/2022   PLT 103 (L) 05/22/2022    Lab Results  Component Value Date   CREATININE 0.66 05/22/2022   BUN 11 05/22/2022   NA 136 05/22/2022   K 3.1 (L) 05/22/2022   CL 106 05/22/2022   CO2 22 05/22/2022    Lab Results  Component Value Date   ALT 30 05/18/2022   AST 19 05/18/2022   GGT 190 (H) 08/08/2019   ALKPHOS 107 05/18/2022     Microbiology: Recent Results (from the past 240 hour(s))  Resp panel by RT-PCR (RSV, Flu A&B, Covid) Anterior Nasal Swab     Status: None   Collection Time: 05/18/22 11:44 AM   Specimen: Anterior Nasal Swab  Result Value Ref Range Status   SARS Coronavirus 2 by RT PCR NEGATIVE NEGATIVE Final    Comment: (NOTE) SARS-CoV-2 target nucleic acids are NOT DETECTED.  The SARS-CoV-2 RNA is generally detectable in upper respiratory specimens during the acute phase of infection. The lowest concentration of SARS-CoV-2 viral copies this assay can detect is 138 copies/mL. A negative result does not preclude SARS-Cov-2 infection and should not be used as the sole basis for treatment or other patient management decisions. A negative result may occur with  improper specimen collection/handling, submission of specimen  other than nasopharyngeal swab, presence of viral mutation(s) within the areas targeted by this assay, and inadequate number of viral copies(<138 copies/mL). A negative result must be combined with clinical observations, patient history, and epidemiological information. The expected result is Negative.  Fact Sheet for Patients:  BloggerCourse.com  Fact Sheet for Healthcare Providers:  SeriousBroker.it  This test is no t yet approved or cleared by the [place] FDA and  has been authorized for detection and/or diagnosis of SARS-CoV-2 by FDA under an Emergency Use Authorization (EUA). This EUA will remain  in effect (meaning this test can be used) for the duration of the COVID-19 declaration under Section 564(b)(1) of the Act, 21 U.S.C.section 360bbb-3(b)(1), unless the authorization is terminated  or revoked sooner.       Influenza A by PCR NEGATIVE NEGATIVE Final   Influenza B by PCR NEGATIVE NEGATIVE Final    Comment: (NOTE) The Xpert Xpress SARS-CoV-2/FLU/RSV plus assay is intended as an aid in the diagnosis of influenza from Nasopharyngeal swab specimens and should not be used as a sole basis for treatment. Nasal washings and aspirates are unacceptable for Xpert Xpress SARS-CoV-2/FLU/RSV testing.  Fact Sheet for Patients: BloggerCourse.com  Fact Sheet for Healthcare Providers: SeriousBroker.it  This test is not yet approved or cleared by the [place] FDA and has been authorized for detection and/or diagnosis of SARS-CoV-2 by FDA under an Emergency Use Authorization (EUA). This  EUA will remain in effect (meaning this test can be used) for the duration of the COVID-19 declaration under Section 564(b)(1) of the Act, 21 U.S.C. section 360bbb-3(b)(1), unless the authorization is terminated or revoked.     Resp Syncytial Virus by PCR NEGATIVE NEGATIVE Final     Comment: (NOTE) Fact Sheet for Patients: BloggerCourse.com  Fact Sheet for Healthcare Providers: SeriousBroker.it  This test is not yet approved or cleared by the [place] FDA and has been authorized for detection and/or diagnosis of SARS-CoV-2 by FDA under an Emergency Use Authorization (EUA). This EUA will remain in effect (meaning this test can be used) for the duration of the COVID-19 declaration under Section 564(b)(1) of the Act, 21 U.S.C. section 360bbb-3(b)(1), unless the authorization is terminated or revoked.  Performed at [facility], [address], [place], [place] [address]   Blood Culture (routine x 2)     Status: None (Preliminary result)   Collection Time: 05/18/22 12:48 PM   Specimen: BLOOD  Result Value Ref Range Status   Specimen Description   Final    BLOOD Port Performed at [facility], [address] [place], [place] [address]    Special Requests   Final    BOTTLES DRAWN AEROBIC AND ANAEROBIC Blood Culture adequate volume Performed at [facility], [address] [place], [place] [address]    Culture   Final    NO GROWTH 4 DAYS Performed at [facility], [address], [place], [place] [address]    Report Status PENDING  Incomplete  Blood Culture (routine x 2)     Status: None (Preliminary result)   Collection Time: 05/18/22 12:48 PM   Specimen: BLOOD  Result Value Ref Range Status   Specimen Description   Final    BLOOD PORTA CATH Performed at [facility], [address] [place], [place] [address]    Special Requests   Final    BOTTLES DRAWN AEROBIC AND ANAEROBIC Blood Culture adequate volume Performed at [facility], [address] [place], [place] [address]    Culture   Final    NO GROWTH 4 DAYS Performed at [facility], [address], [place], [place] [address]    Report  Status PENDING  Incomplete  MRSA Next Gen by PCR, Nasal     Status: Abnormal   Collection Time: 05/19/22 11:07 AM   Specimen: Nasal Mucosa; Nasal Swab  Result Value Ref Range Status   MRSA by PCR Next Gen DETECTED (A) NOT DETECTED Final    Comment: (NOTE) The GeneXpert MRSA Assay (FDA approved for NASAL specimens only), is one component of a comprehensive MRSA colonization surveillance program. It is not intended to diagnose MRSA infection nor to guide or monitor treatment for MRSA infections. Test performance is not FDA approved in patients less than 27 years old. Performed at [facility], [address] [place], [place] [address]   Culture, blood (Routine X 2) w Reflex to ID Panel     Status: None (Preliminary result)   Collection Time: 05/19/22  3:58 PM   Specimen: BLOOD RIGHT HAND  Result Value Ref Range Status   Specimen Description   Final    BLOOD RIGHT HAND Performed at [facility], [address], [place], [place] [address]    Special Requests   Final    BOTTLES DRAWN AEROBIC ONLY Blood Culture results may not be optimal due  to an inadequate volume of blood received in culture bottles Performed at [facility], [address] [place], [place] [address]    Culture   Final    NO GROWTH 3 DAYS Performed at [facility], [address] [place], [place] [address]    Report Status PENDING  Incomplete  Culture, blood (Routine X 2) w Reflex to ID Panel     Status: None (Preliminary result)   Collection Time: 05/19/22  4:05 PM   Specimen: BLOOD LEFT ARM  Result Value Ref Range Status   Specimen Description   Final    BLOOD LEFT ARM Performed at [facility], [address] [place], [place] [address]    Special Requests   Final    BOTTLES DRAWN AEROBIC ONLY Blood Culture results may not be optimal due to an inadequate volume of blood received in culture bottles Performed at [facility], [address] [place], [place] [address]    Culture   Final    NO GROWTH 3 DAYS Performed at [facility], [address], [place], [place] [address]    Report Status PENDING  Incomplete    Impression/Plan:  1. Fever - overall fever curve trending down on current antibiotics.  No positive blood cultures to date.   Will continue with vancomycin and cefepime.  2.  Port-a-cath - clinically looks a bit worse now superficially.  More tenderness on touch and a bit more erythema.   Though I would hate to remove another port, I do think it should be evaluated or potentially a superficial debridement.    3.  Hodgkin's lymphoma - followed by Dr. Leonides Schanz and getting chemotherapy.   4.  Neutropenia - resolved.  Will continue to monitor.

## 2022-05-23 ENCOUNTER — Inpatient Hospital Stay: Payer: Medicare Other

## 2022-05-23 ENCOUNTER — Inpatient Hospital Stay: Payer: Medicare Other | Admitting: Physician Assistant

## 2022-05-23 DIAGNOSIS — C8198 Hodgkin lymphoma, unspecified, lymph nodes of multiple sites: Secondary | ICD-10-CM | POA: Diagnosis not present

## 2022-05-23 DIAGNOSIS — L988 Other specified disorders of the skin and subcutaneous tissue: Secondary | ICD-10-CM | POA: Diagnosis not present

## 2022-05-23 DIAGNOSIS — Z95828 Presence of other vascular implants and grafts: Secondary | ICD-10-CM | POA: Diagnosis not present

## 2022-05-23 DIAGNOSIS — D709 Neutropenia, unspecified: Secondary | ICD-10-CM | POA: Diagnosis not present

## 2022-05-23 DIAGNOSIS — R5081 Fever presenting with conditions classified elsewhere: Secondary | ICD-10-CM | POA: Diagnosis not present

## 2022-05-23 LAB — CBC WITH DIFFERENTIAL/PLATELET
Abs Immature Granulocytes: 0.64 10*3/uL — ABNORMAL HIGH (ref 0.00–0.07)
Basophils Absolute: 0 10*3/uL (ref 0.0–0.1)
Basophils Relative: 0 %
Eosinophils Absolute: 0.1 10*3/uL (ref 0.0–0.5)
Eosinophils Relative: 1 %
HCT: 22.6 % — ABNORMAL LOW (ref 36.0–46.0)
Hemoglobin: 7.1 g/dL — ABNORMAL LOW (ref 12.0–15.0)
Immature Granulocytes: 7 %
Lymphocytes Relative: 24 %
Lymphs Abs: 2.3 10*3/uL (ref 0.7–4.0)
MCH: 29.6 pg (ref 26.0–34.0)
MCHC: 31.4 g/dL (ref 30.0–36.0)
MCV: 94.2 fL (ref 80.0–100.0)
Monocytes Absolute: 1.2 10*3/uL — ABNORMAL HIGH (ref 0.1–1.0)
Monocytes Relative: 13 %
Neutro Abs: 5.3 10*3/uL (ref 1.7–7.7)
Neutrophils Relative %: 55 %
Platelets: 131 10*3/uL — ABNORMAL LOW (ref 150–400)
RBC: 2.4 MIL/uL — ABNORMAL LOW (ref 3.87–5.11)
RDW: 17.4 % — ABNORMAL HIGH (ref 11.5–15.5)
WBC: 9.6 10*3/uL (ref 4.0–10.5)
nRBC: 0.7 % — ABNORMAL HIGH (ref 0.0–0.2)

## 2022-05-23 LAB — CULTURE, BLOOD (ROUTINE X 2)
Culture: NO GROWTH
Special Requests: ADEQUATE
Special Requests: ADEQUATE

## 2022-05-23 LAB — BASIC METABOLIC PANEL
Anion gap: 5 (ref 5–15)
BUN: 11 mg/dL (ref 8–23)
CO2: 23 mmol/L (ref 22–32)
Calcium: 7.9 mg/dL — ABNORMAL LOW (ref 8.9–10.3)
Chloride: 107 mmol/L (ref 98–111)
Creatinine, Ser: 0.62 mg/dL (ref 0.44–1.00)
GFR, Estimated: >60 mL/min (ref >60)
Glucose, Bld: 245 mg/dL — ABNORMAL HIGH (ref 70–99)
Potassium: 3.1 mmol/L — ABNORMAL LOW (ref 3.5–5.1)
Sodium: 135 mmol/L (ref 135–145)

## 2022-05-23 LAB — GLUCOSE, CAPILLARY
Glucose-Capillary: 223 mg/dL — ABNORMAL HIGH (ref 70–99)
Glucose-Capillary: 302 mg/dL — ABNORMAL HIGH (ref 70–99)
Glucose-Capillary: 240 mg/dL — ABNORMAL HIGH (ref 70–99)
Glucose-Capillary: 202 mg/dL — ABNORMAL HIGH (ref 70–99)

## 2022-05-23 LAB — MAGNESIUM: Magnesium: 1.5 mg/dL — ABNORMAL LOW (ref 1.7–2.4)

## 2022-05-23 MED ORDER — DIPHENHYDRAMINE HCL 25 MG PO CAPS: 25.0000 mg | ORAL_CAPSULE | Freq: Four times a day (QID) | ORAL | Status: DC | PRN

## 2022-05-23 MED ORDER — NYSTATIN 100000 UNIT/GM EX POWD
Freq: Two times a day (BID) | CUTANEOUS | Status: DC
  Filled 2022-05-23: qty 15

## 2022-05-23 MED ORDER — POTASSIUM CHLORIDE CRYS ER 20 MEQ PO TBCR
40.0000 meq | EXTENDED_RELEASE_TABLET | ORAL | Status: AC
  Administered 2022-05-23 (×2): 40 meq via ORAL
  Filled 2022-05-23 (×2): qty 2

## 2022-05-23 MED ORDER — MAGNESIUM SULFATE 2 GM/50ML IV SOLN
2.0000 g | Freq: Once | INTRAVENOUS | Status: AC
  Administered 2022-05-23: 2 g via INTRAVENOUS
  Filled 2022-05-23: qty 50

## 2022-05-23 MED ORDER — INSULIN GLARGINE-YFGN 100 UNIT/ML ~~LOC~~ SOLN
14.0000 [IU] | Freq: Every day | SUBCUTANEOUS | Status: DC
  Administered 2022-05-23 – 2022-05-24 (×2): 14 [IU] via SUBCUTANEOUS
  Filled 2022-05-23 (×2): qty 0.14

## 2022-05-23 NOTE — Progress Notes (Signed)
[facility]   Reason for visit: Follow up on fever  Interval History: fever curve has trended down and has remained afebrile > 72 hours, WBC wnl.  Now with rash on back and facial rash   Physical Exam: Constitutional:  Vitals:   05/23/22 0542 05/23/22 1332  BP: (!) 146/60 (!) 144/64  Pulse: 93 89  Resp: 18 (!) 22  Temp: 98.4 F (36.9 C) 98.9 F (37.2 C)  SpO2: 95% 96%   patient appears in NAD Eyes: anicteric HENT: no thrush Chest: port in place and some spreading of the erythema under the dressing, less tenderness, no drainage Respiratory: Normal respiratory effort  Review of Systems: Constitutional: negative for fevers and chills  Lab Results  Component Value Date   WBC 9.6 05/23/2022   HGB 7.1 (L) 05/23/2022   HCT 22.6 (L) 05/23/2022   MCV 94.2 05/23/2022   PLT 131 (L) 05/23/2022    Lab Results  Component Value Date   CREATININE 0.62 05/23/2022   BUN 11 05/23/2022   NA 135 05/23/2022   K 3.1 (L) 05/23/2022   CL 107 05/23/2022   CO2 23 05/23/2022    Lab Results  Component Value Date   ALT 30 05/18/2022   AST 19 05/18/2022   GGT 190 (H) 08/08/2019   ALKPHOS 107 05/18/2022     Microbiology: Recent Results (from the past 240 hour(s))  Resp panel by RT-PCR (RSV, Flu A&B, Covid) Anterior Nasal Swab     Status: None   Collection Time: 05/18/22 11:44 AM   Specimen: Anterior Nasal Swab  Result Value Ref Range Status   SARS Coronavirus 2 by RT PCR NEGATIVE NEGATIVE Final    Comment: (NOTE) SARS-CoV-2 target nucleic acids are NOT DETECTED.  The SARS-CoV-2 RNA is generally detectable in upper respiratory specimens during the acute phase of infection. The lowest concentration of SARS-CoV-2 viral copies this assay can detect is 138 copies/mL. A negative result does not preclude SARS-Cov-2 infection and should not be used as the sole basis for treatment or other patient management decisions. A negative result may occur with   improper specimen collection/handling, submission of specimen other than nasopharyngeal swab, presence of viral mutation(s) within the areas targeted by this assay, and inadequate number of viral copies(<138 copies/mL). A negative result must be combined with clinical observations, patient history, and epidemiological information. The expected result is Negative.  Fact Sheet for Patients:  BloggerCourse.com  Fact Sheet for Healthcare Providers:  SeriousBroker.it  This test is no t yet approved or cleared by the [place] FDA and  has been authorized for detection and/or diagnosis of SARS-CoV-2 by FDA under an Emergency Use Authorization (EUA). This EUA will remain  in effect (meaning this test can be used) for the duration of the COVID-19 declaration under Section 564(b)(1) of the Act, 21 U.S.C.section 360bbb-3(b)(1), unless the authorization is terminated  or revoked sooner.       Influenza A by PCR NEGATIVE NEGATIVE Final   Influenza B by PCR NEGATIVE NEGATIVE Final    Comment: (NOTE) The Xpert Xpress SARS-CoV-2/FLU/RSV plus assay is intended as an aid in the diagnosis of influenza from Nasopharyngeal swab specimens and should not be used as a sole basis for treatment. Nasal washings and aspirates are unacceptable for Xpert Xpress SARS-CoV-2/FLU/RSV testing.  Fact Sheet for Patients: BloggerCourse.com  Fact Sheet for Healthcare Providers: SeriousBroker.it  This test is not yet approved or cleared by the [place] and has been authorized  for detection and/or diagnosis of SARS-CoV-2 by FDA under an Emergency Use Authorization (EUA). This EUA will remain in effect (meaning this test can be used) for the duration of the COVID-19 declaration under Section 564(b)(1) of the Act, 21 U.S.C. section 360bbb-3(b)(1), unless the authorization is terminated or revoked.      Resp Syncytial Virus by PCR NEGATIVE NEGATIVE Final    Comment: (NOTE) Fact Sheet for Patients: BloggerCourse.com  Fact Sheet for Healthcare Providers: SeriousBroker.it  This test is not yet approved or cleared by the [place] FDA and has been authorized for detection and/or diagnosis of SARS-CoV-2 by FDA under an Emergency Use Authorization (EUA). This EUA will remain in effect (meaning this test can be used) for the duration of the COVID-19 declaration under Section 564(b)(1) of the Act, 21 U.S.C. section 360bbb-3(b)(1), unless the authorization is terminated or revoked.  Performed at [facility], [address], [place], [place] [address]   Blood Culture (routine x 2)     Status: None   Collection Time: 05/18/22 12:48 PM   Specimen: BLOOD  Result Value Ref Range Status   Specimen Description   Final    BLOOD Port Performed at [facility], [address] [place], [place] [address]    Special Requests   Final    BOTTLES DRAWN AEROBIC AND ANAEROBIC Blood Culture adequate volume Performed at [facility], [address], [place], [place] [address]    Culture   Final    NO GROWTH 5 DAYS Performed at [facility], [address] [place], [place] [address]    Report Status 05/23/2022 FINAL  Final  Blood Culture (routine x 2)     Status: None   Collection Time: 05/18/22 12:48 PM   Specimen: BLOOD  Result Value Ref Range Status   Specimen Description   Final    BLOOD PORTA CATH Performed at [facility], [address], [place], [place] [address]    Special Requests   Final    BOTTLES DRAWN AEROBIC AND ANAEROBIC Blood Culture adequate volume Performed at [facility], [address] [place], [place] [address]    Culture   Final    NO GROWTH 5 DAYS Performed at [facility], [address] [place],  [place] [address]    Report Status 05/23/2022 FINAL  Final  MRSA Next Gen by PCR, Nasal     Status: Abnormal   Collection Time: 05/19/22 11:07 AM   Specimen: Nasal Mucosa; Nasal Swab  Result Value Ref Range Status   MRSA by PCR Next Gen DETECTED (A) NOT DETECTED Final    Comment: (NOTE) The GeneXpert MRSA Assay (FDA approved for NASAL specimens only), is one component of a comprehensive MRSA colonization surveillance program. It is not intended to diagnose MRSA infection nor to guide or monitor treatment for MRSA infections. Test performance is not FDA approved in patients less than 79 years old. Performed at [facility], [address] [place], [place] [address]   Culture, blood (Routine X 2) w Reflex to ID Panel     Status: None (Preliminary result)   Collection Time: 05/19/22  3:58 PM   Specimen: BLOOD RIGHT HAND  Result Value Ref Range Status   Specimen Description   Final    BLOOD RIGHT HAND Performed at [facility], [address], [place], [place] [address]    Special Requests   Final  BOTTLES DRAWN AEROBIC ONLY Blood Culture results may not be optimal due to an inadequate volume of blood received in culture bottles Performed at [facility], [address], [place], [place] [address]    Culture   Final    NO GROWTH 4 DAYS Performed at [facility], [address], [place], [place] [address]    Report Status PENDING  Incomplete  Culture, blood (Routine X 2) w Reflex to ID Panel     Status: None (Preliminary result)   Collection Time: 05/19/22  4:05 PM   Specimen: BLOOD LEFT ARM  Result Value Ref Range Status   Specimen Description   Final    BLOOD LEFT ARM Performed at [facility], [address] [place], [place] [address]    Special Requests   Final    BOTTLES DRAWN AEROBIC ONLY Blood Culture results may not be optimal due to an inadequate volume of blood received in culture bottles Performed at [facility], [address] [place], [place] [address]    Culture   Final    NO GROWTH 4 DAYS Performed at [facility], [address], [place], [place] [address]    Report Status PENDING  Incomplete    Impression/Plan:  1. Rash - she has erythema at the upper portion of the dressing around the port and now looks more irritated than infected.  Ultrasound of the area negative for any fluid collection.   Discussed with Dr. Loreta Ave and Leonides Schanz and I agree that it is reasonable to continue to use the port and monitor the area and no indication for port removal at this time.    2.  Febrile neutropenia - no longer neutropenic and fever resolved.  Will plan on 7 days of antibiotics then stop tomorrow.   Ok for discharge from ID standpoint after that if she is doing well.   3. Hodgkin's lymphoma - plan to resume chemotherapy 4/30.    I will sign off, call with questions.

## 2022-05-23 NOTE — Progress Notes (Signed)
Bedside nurse paged provider to assess risks and benefits of using a purewick on the patient. Provider stated due to the patient's skin integrity the purewick system is the most appropriate to ensure her wounds do not worsen. Order in place states to place female external catheter. Bedside nurse to replace purewick.

## 2022-05-23 NOTE — Progress Notes (Signed)
IR was requested for port removal.   Patient is known to IR, she originally had PAC placed on 01/10/22 due to her Hodgkin's lymphoma, she developed neutropenic fever and IR was requested for PAC removal, removed on 02/28/22. Catheter tip culture showed no growth. PAC was placed on 04/21/22 again.  Patient developed neutropenic fever on 4/11, ID has been following and two blood cx obtained from the port on 4/11, showed no growth for 5 days, two blood cx from peripheral veins on 4/12 showed no growth for 4 days.   Patient has recovered from neutropenic fever, but she developed erythema around the port, ID recommended port removal.  Focused US soft tissue around the port site negative for skin abscess/infection.   Patient seen at bedside with Dr. Loreta Ave. RN at bedside.  Patient states that she did have some tenderness and itchiness around her port site when she came into the hospital, but it has resolved and se has no complaints regarding the port, wishes that she can keep it.   Port is being used.  Dressing gently removed, there is some skin breakdown at 7-8 o'clock 1 inch away from the port  and at 1-2 o'clock 0.5 inch away from the port, appears that it was caused by the reaction the adhesive from the dressing.  Port site is clean, no significant erythema, no papules or skin breakdown.   RN reports that patient started to develop dry skin around her mouth and red skin lesions in her back since vancomycin was started, advised to administer atarax with vancomycin. Patient's back lesions checked with RN, significantly worse than yesterday- RN will discuss with attending today.   Dr. Loreta Ave recommends against the port removal, he discussed with  Dr. Luciana Axe from ID, decision was made to cancel port removal. Patient will continue conservative management.   Will delete the port removal order.  Please call IR for questions and concerns.   Lynann Bologna Mairead Schwarzkopf PA-C 05/23/2022 10:40 AM

## 2022-05-23 NOTE — Inpatient Diabetes Management (Signed)
Inpatient Diabetes Program Recommendations  AACE/ADA: New Consensus Statement on Inpatient Glycemic Control (2015)  Target Ranges:  Prepandial:   less than 140 mg/dL      Peak postprandial:   less than 180 mg/dL (1-2 hours)      Critically ill patients:  140 - 180 mg/dL   Lab Results  Component Value Date   GLUCAP 223 (H) 05/23/2022   HGBA1C 10.0 (H) 02/10/2022    Review of Glycemic Control  Latest Reference Range & Units 05/22/22 08:01 05/22/22 12:05 05/22/22 17:03 05/22/22 22:34 05/23/22 07:39  Glucose-Capillary 70 - 99 mg/dL 161 (H) 096 (H) 045 (H) 231 (H) 223 (H)  (H): Data is abnormally high  Diabetes history: DM2 Outpatient Diabetes medications: Lantus 42 units QAM, and 28 units QHS, Novolog 0-12 units TID and Glipizide 10 mg BID Current orders for Inpatient glycemic control: Novolog 0-15 units TID and 0-5 units QHS   Inpatient Diabetes Program Recommendations:     Semglee 14 units QD (0.15 units x 93.7 kg)   Will continue to follow while inpatient.   Thank you, Dulce Sellar, MSN, CDCES Diabetes Coordinator Inpatient Diabetes Program [phone] (team pager from 8a-5p)

## 2022-05-23 NOTE — Assessment & Plan Note (Signed)
-   multiple areas on back and inguinal folds; happens at [facility] occasionally too. Patient is bed/WC bound at baseline - WOC eval appreciated - Apply antifungal powder BID to bilat breast and lower abd skin folds. Apply xeroform gauze to posterior back wounds Q day and cover with ABD pads; hold in place with mesh underwear with the crotch cut out to form a "tank top".

## 2022-05-23 NOTE — Consult Note (Addendum)
WOC Nurse Consult Note: Reason for Consult: Consult requested for back.  Pt has red moist macerated partial thickness skin loss with loose peeling skin to middle back from unknown etiology; possible drug reaction combined with moisture associated skin damage? Lower abd skin fold and bilat lower breast skin folds are red, moist and macerated; appearance is consistent with moisture associated skin damage.  ICD-10 CM Codes for Irritant Dermatitis L24A0 - Due to friction or contact with body fluids;  L30.4  - Erythema intertrigo. Also used for dermatitis due to sweating and friction, friction dermatitis, friction eczema, and genital/thigh intertrigo.   Dressing procedure/placement/frequency: Topical treatment orders provided for bedside nurses to perform as follows to promote drying and healing:  1. Apply antifungal powder BID and PRN to bilat breast and lower abd skin folds 2. Apply xeroform gauze to posterior back wounds Q day and cover with ABD pads; hold in place with mesh underwear with the crotch cut out to form a "tank top". Please re-consult if further assistance is needed.  Thank-you,  Cammie Mcgee MSN, RN, CWOCN, [place], CNS [phone]

## 2022-05-23 NOTE — Progress Notes (Signed)
Progress Note    Heather Petty   VHQ:[id number]  DOB: May 01, 1950  DOA: 05/18/2022     5 PCP: System, Provider Not In  Initial CC: fever, aches  Hospital Course: Heather Petty is a 72 yo female with PMH Hodgkin's lymphoma, schizophrenia, obesity, DM II, asthma who presented with fever from her nursing facility.  She also developed aching pain throughout.  Recent chemoinfusion on 05/09/2022. She also has a known history of suspected port infection and underwent removal on 02/28/2022 and completion of IV antibiotics for 2 weeks.  Port was reimplanted with radiology on 04/21/2022. On admission, there was some redness around her port site along with increased pain.  Fever on admission noted at 103.1.  Neutropenia also noted, WBC 1.3.  She was started on IV antibiotics, ID was consulted.  Interval History:  No events overnight.  Actually stating that port site is less tender today.  She also was adamantly against removal. She has also started now complaining of some skin maceration from her back and bilateral inguinal regions.  She states this happens periodically at [facility] due to being bed/wheelchair-bound.  She says they typically put some zinc or powder that helps.  Assessment and Plan: * Febrile neutropenia - Cycle 3 completed on 05/09/2022 (doxorubicin, vinblastine, dacarbazine) -Treated with PEG filgrastim on 05/11/2022 - nadir completed during hospitalization; WBC improved  -Unclear source at this time.  History of suspected infected port and possibly similar again. CXR clear. UA negative.  Blood cultures in process and remaining negative from port and peripheral  - port appears benign essentially; some tenderness but she'd had it since re-insertion; some redness but with trending seems moreso irritation than infection related. Patient also hoping not to remove port  -Case has been discussed between ID, IR, and oncology; plan is for keeping port at this time and completing antibiotic  course on 05/24/2022  Skin maceration - multiple areas on back and inguinal folds; happens at [facility] occasionally too. Patient is bed/WC bound at baseline - WOC eval appreciated - Apply antifungal powder BID to bilat breast and lower abd skin folds. Apply xeroform gauze to posterior back wounds Q day and cover with ABD pads; hold in place with mesh underwear with the crotch cut out to form a "tank top".  Port-A-Cath in place - See febrile neutropenia -Port reimplanted 04/21/2022  Hodgkin lymphoma - Stage III Hodgkin's lymphoma, diagnosed November 2023.  Follows with Heather Petty. -Continue allopurinol for TLS prophylaxis -Next treatment with oncology planned for 06/06/2022  Hyponatremia - Mild and clinically insignificant - s/p fluids  Schizophrenia - Continue doxepin.  Previously had also been on Risperdal also but no longer on med rec  DM II (diabetes mellitus, type II), controlled - Continue SSI and CBG monitoring  GERD (gastroesophageal reflux disease) - continue pepcid  HTN (hypertension) - hypotensive previously; BP meds on hold  Hypotension-resolved as of 05/23/2022 - improved - BP meds on hold   Old records reviewed in assessment of this patient  Antimicrobials: Vancomycin 05/18/2022 >> current Cefepime 05/18/2022 >> current  DVT prophylaxis:  enoxaparin (LOVENOX) injection 40 mg Start: 05/18/22 2200 SCDs Start: 05/18/22 1503   Code Status:   Code Status: Full Code  Mobility Assessment (last 72 hours)     Mobility Assessment     Row Name 05/22/22 2000 05/22/22 0815 05/21/22 2300 05/21/22 1614 05/21/22 1217   Does patient have an order for bedrest or is patient medically unstable Yes- Bedfast (Level 1) - Complete Yes- Bedfast (Level  1) - Complete Yes- Bedfast (Level 1) - Complete -- Yes- Bedfast (Level 1) - Complete   What is the highest level of mobility based on the progressive mobility assessment? Level 1 (Bedfast) - Unable to balance while sitting on edge of  bed Level 1 (Bedfast) - Unable to balance while sitting on edge of bed Level 1 (Bedfast) - Unable to balance while sitting on edge of bed Level 1 (Bedfast) - Unable to balance while sitting on edge of bed Level 1 (Bedfast) - Unable to balance while sitting on edge of bed   Is the above level different from baseline mobility prior to current illness? Yes - Recommend PT order Yes - Recommend PT order Yes - Recommend PT order -- --    Row Name 05/20/22 2300           Does patient have an order for bedrest or is patient medically unstable Yes- Bedfast (Level 1) - Complete                Barriers to discharge:  Disposition Plan: SNF, back to [facility] Status is: Inpt  Objective: Blood pressure (!) 144/64, pulse 89, temperature 98.9 F (37.2 C), temperature source Oral, resp. rate (!) 22, height 5\' 2"  (1.575 m), weight 93.7 kg, SpO2 96 %.  Examination:  Physical Exam Constitutional:      Appearance: She is obese.     Comments: Awake, alert, and much more comfortable appearing.  No further moaning  HENT:     Head: Normocephalic and atraumatic.     Mouth/Throat:     Mouth: Mucous membranes are moist.  Eyes:     Extraocular Movements: Extraocular movements intact.  Cardiovascular:     Rate and Rhythm: Normal rate and regular rhythm.  Pulmonary:     Effort: Pulmonary effort is normal. No respiratory distress.     Breath sounds: Normal breath sounds. No wheezing.  Chest:     Comments: Port-A-Cath in place with more TTP today; some scant erythema and a subtle scab at the top Abdominal:     General: Bowel sounds are normal. There is no distension.     Palpations: Abdomen is soft.     Tenderness: There is no abdominal tenderness.  Musculoskeletal:        General: Normal range of motion.     Cervical back: Normal range of motion and neck supple.  Skin:    Comments: Diffuse vitiligo appreciated; now appreciated maceration with tenderness of skin on back and B/L inguinal region   Neurological:     General: No focal deficit present.  Psychiatric:        Mood and Affect: Mood normal.      Consultants:  ID  Procedures:    Data Reviewed: Results for orders placed or performed during the hospital encounter of 05/18/22 (from the past 24 hour(s))  Glucose, capillary     Status: Abnormal   Collection Time: 05/22/22  5:03 PM  Result Value Ref Range   Glucose-Capillary 237 (H) 70 - 99 mg/dL   Comment 1 Notify RN   Glucose, capillary     Status: Abnormal   Collection Time: 05/22/22 10:34 PM  Result Value Ref Range   Glucose-Capillary 231 (H) 70 - 99 mg/dL   Comment 1 Notify RN    Comment 2 Document in Chart   Basic metabolic panel     Status: Abnormal   Collection Time: 05/23/22  5:47 AM  Result Value Ref Range   Sodium 135  135 - 145 mmol/L   Potassium 3.1 (L) 3.5 - 5.1 mmol/L   Chloride 107 98 - 111 mmol/L   CO2 23 22 - 32 mmol/L   Glucose, Bld 245 (H) 70 - 99 mg/dL   BUN 11 8 - 23 mg/dL   Creatinine, Ser 9.52 0.44 - 1.00 mg/dL   Calcium 7.9 (L) 8.9 - 10.3 mg/dL   GFR, Estimated >84 >13 mL/min   Anion gap 5 5 - 15  CBC with Differential/Platelet     Status: Abnormal   Collection Time: 05/23/22  5:47 AM  Result Value Ref Range   WBC 9.6 4.0 - 10.5 K/uL   RBC 2.40 (L) 3.87 - 5.11 MIL/uL   Hemoglobin 7.1 (L) 12.0 - 15.0 g/dL   HCT 24.4 (L) 01.0 - 27.2 %   MCV 94.2 80.0 - 100.0 fL   MCH 29.6 26.0 - 34.0 pg   MCHC 31.4 30.0 - 36.0 g/dL   RDW 53.6 (H) 64.4 - 03.4 %   Platelets 131 (L) 150 - 400 K/uL   nRBC 0.7 (H) 0.0 - 0.2 %   Neutrophils Relative % 55 %   Neutro Abs 5.3 1.7 - 7.7 K/uL   Lymphocytes Relative 24 %   Lymphs Abs 2.3 0.7 - 4.0 K/uL   Monocytes Relative 13 %   Monocytes Absolute 1.2 (H) 0.1 - 1.0 K/uL   Eosinophils Relative 1 %   Eosinophils Absolute 0.1 0.0 - 0.5 K/uL   Basophils Relative 0 %   Basophils Absolute 0.0 0.0 - 0.1 K/uL   WBC Morphology Mild Left Shift (1-5% metas, occ myelo)    Immature Granulocytes 7 %   Abs  Immature Granulocytes 0.64 (H) 0.00 - 0.07 K/uL   Polychromasia PRESENT   Magnesium     Status: Abnormal   Collection Time: 05/23/22  5:47 AM  Result Value Ref Range   Magnesium 1.5 (L) 1.7 - 2.4 mg/dL  Glucose, capillary     Status: Abnormal   Collection Time: 05/23/22  7:39 AM  Result Value Ref Range   Glucose-Capillary 223 (H) 70 - 99 mg/dL  Glucose, capillary     Status: Abnormal   Collection Time: 05/23/22 12:00 PM  Result Value Ref Range   Glucose-Capillary 302 (H) 70 - 99 mg/dL    I have reviewed pertinent nursing notes, vitals, labs, and images as necessary. I have ordered labwork to follow up on as indicated.  I have reviewed the last notes from staff over past 24 hours. I have discussed patient's care plan and test results with nursing staff, CM/SW, and other staff as appropriate.  Time spent: Greater than 50% of the 55 minute visit was spent in counseling/coordination of care for the patient as laid out in the A&P.   LOS: 5 days   Lewie Chamber, MD [facility] 05/23/2022, 3:15 PM

## 2022-05-24 DIAGNOSIS — D709 Neutropenia, unspecified: Secondary | ICD-10-CM | POA: Diagnosis not present

## 2022-05-24 DIAGNOSIS — R5081 Fever presenting with conditions classified elsewhere: Secondary | ICD-10-CM | POA: Diagnosis not present

## 2022-05-24 LAB — CBC WITH DIFFERENTIAL/PLATELET
Abs Immature Granulocytes: 0.63 10*3/uL — ABNORMAL HIGH (ref 0.00–0.07)
Basophils Absolute: 0.1 10*3/uL (ref 0.0–0.1)
Basophils Relative: 1 %
Eosinophils Absolute: 0.1 10*3/uL (ref 0.0–0.5)
Eosinophils Relative: 1 %
HCT: 22.7 % — ABNORMAL LOW (ref 36.0–46.0)
Hemoglobin: 7.3 g/dL — ABNORMAL LOW (ref 12.0–15.0)
Immature Granulocytes: 6 %
Lymphocytes Relative: 24 %
Lymphs Abs: 2.6 10*3/uL (ref 0.7–4.0)
MCH: 30.2 pg (ref 26.0–34.0)
MCHC: 32.2 g/dL (ref 30.0–36.0)
MCV: 93.8 fL (ref 80.0–100.0)
Monocytes Absolute: 1.4 10*3/uL — ABNORMAL HIGH (ref 0.1–1.0)
Monocytes Relative: 13 %
Neutro Abs: 6.4 10*3/uL (ref 1.7–7.7)
Neutrophils Relative %: 55 %
Platelets: 154 10*3/uL (ref 150–400)
RBC: 2.42 MIL/uL — ABNORMAL LOW (ref 3.87–5.11)
RDW: 18 % — ABNORMAL HIGH (ref 11.5–15.5)
WBC: 11.2 10*3/uL — ABNORMAL HIGH (ref 4.0–10.5)
nRBC: 1 % — ABNORMAL HIGH (ref 0.0–0.2)

## 2022-05-24 LAB — BASIC METABOLIC PANEL
Anion gap: 6 (ref 5–15)
BUN: 9 mg/dL (ref 8–23)
CO2: 25 mmol/L (ref 22–32)
Calcium: 8.3 mg/dL — ABNORMAL LOW (ref 8.9–10.3)
Chloride: 105 mmol/L (ref 98–111)
Creatinine, Ser: 0.63 mg/dL (ref 0.44–1.00)
GFR, Estimated: >60 mL/min (ref >60)
Glucose, Bld: 231 mg/dL — ABNORMAL HIGH (ref 70–99)
Potassium: 3.7 mmol/L (ref 3.5–5.1)
Sodium: 136 mmol/L (ref 135–145)

## 2022-05-24 LAB — GLUCOSE, CAPILLARY
Glucose-Capillary: 210 mg/dL — ABNORMAL HIGH (ref 70–99)
Glucose-Capillary: 248 mg/dL — ABNORMAL HIGH (ref 70–99)

## 2022-05-24 LAB — MAGNESIUM: Magnesium: 2 mg/dL (ref 1.7–2.4)

## 2022-05-24 MED ORDER — NYSTATIN 100000 UNIT/GM EX POWD: Freq: Two times a day (BID) | CUTANEOUS | 0 refills | Status: DC

## 2022-05-24 MED ORDER — HEPARIN SOD (PORK) LOCK FLUSH 100 UNIT/ML IV SOLN
500.0000 [IU] | Freq: Once | INTRAVENOUS | Status: AC
  Administered 2022-05-24: 500 [IU] via INTRAVENOUS
  Filled 2022-05-24: qty 5

## 2022-05-24 NOTE — TOC Transition Note (Addendum)
Transition of Care [facility]) - CM/SW Discharge Note   Patient Details  Name: Heather Petty MRN: [id number] Date of Birth: Jan 06, 1951  [facility] CM/SW Contact:  Beckie Busing, RN Phone Number:[phone]  05/24/2022, 11:34 AM   Clinical Narrative:    Patient is medically ready to discharge back to her long term care facility. CM has faxed discharge summary to facility. CM reached out to Arna Medici to confirm that facility can receive patient today. There is no answer. Voicemail has been left for [employer]. Will await return call.   Arna Medici has confirmed that patient can return to facility. Transportation has been arranged via [facility]. Daughter [name] has been updated. Discharge packet is at the nurses station. Nurse has been provided number to call report. No other needs noted at this time.          Patient Goals and CMS Choice      Discharge Placement                         Discharge Plan and Services Additional resources added to the After Visit Summary for                                       Social Determinants of Health (SDOH) Interventions SDOH Screenings   Food Insecurity: No Food Insecurity (05/20/2022)  Housing: Low Risk  (05/20/2022)  Transportation Needs: No Transportation Needs (05/20/2022)  Utilities: Not At Risk (05/20/2022)  Tobacco Use: Low Risk  (05/18/2022)     Readmission Risk Interventions     No data to display

## 2022-05-24 NOTE — Discharge Summary (Signed)
Physician Discharge Summary  Tatiana Courter ZOX:[id number] DOB: [birth year] DOA: 05/18/2022  PCP: System, Provider Not In  Admit date: 05/18/2022 Discharge date: 05/24/2022  Admitted From: SNF Disposition:  SNF   Recommendations for Outpatient Follow-up:  Follow up with PCP, oncology  Discharge Condition: Stable CODE STATUS: Full code hold Diet recommendation:  Diet Orders (From admission, onward)     Start     Ordered   05/24/22 0000  Diet Carb Modified        05/24/22 1047   05/18/22 1503  Diet Carb Modified Fluid consistency: Thin; Room service appropriate? Yes  Diet effective now       Question Answer Comment  Diet-HS Snack? Nothing   Calorie Level Medium 1600-2000   Fluid consistency: Thin   Room service appropriate? Yes      05/18/22 1503            Brief/Interim Summary: Heather Petty is a 72 yo female with PMH Hodgkin's lymphoma, schizophrenia, obesity, DM II, asthma who presented with fever from her nursing facility.  She also developed aching pain throughout.  Recent chemoinfusion on 05/09/2022. She also has a known history of suspected port infection and underwent removal on 02/28/2022 and completion of IV antibiotics for 2 weeks.  Port was reimplanted with radiology on 04/21/2022. On admission, there was some redness around her port site along with increased pain.  Fever on admission noted at 103.1.  Neutropenia also noted, WBC 1.3.  She was started on IV antibiotics, ID was consulted.  Patient was recommended to finish 7-day course of vancomycin and cefepime.  She refused last dosing on day 7.  She remained afebrile and clinically stable for discharge back to her long-term care facility.  Discharge Diagnoses:   Principal Problem:   Febrile neutropenia Active Problems:   Hodgkin lymphoma   Port-A-Cath in place   Skin maceration   Hyponatremia   HTN (hypertension)   Hyperlipidemia associated with type 2 diabetes mellitus   GERD (gastroesophageal reflux  disease)   DM II (diabetes mellitus, type II), controlled   Schizophrenia    Discharge Instructions  Discharge Instructions     Diet Carb Modified   Complete by: As directed    Discharge wound care:   Complete by: As directed    1. Apply antifungal powder BID and PRN to bilat breast and lower abd skin folds 2. Apply xeroform gauze to posterior back wounds Q day and cover with ABD pads; hold in place with mesh underwear with the crotch cut out to form a "tank top".   Increase activity slowly   Complete by: As directed       Allergies as of 05/24/2022       Reactions   Emend [aprepitant] Other (See Comments)   Pt experienced shooting back pain at 10/10 when medication was administered the first time. See Hypersensitivity note from 01/17/2022.   Vancomycin Itching, Other (See Comments)   Patient becomes hypotensive, lethargic and itchy. Has tolerated it many times.   Fosaprepitant Other (See Comments)   Back pain / hypersensitivity 01/17/2022   Lasix [furosemide] Other (See Comments)   IV-LASIX ==> Reaction: Burning   Lasix [furosemide] Other (See Comments)   On MAR as allergic   Pineapple Rash        Medication List     STOP taking these medications    antiseptic oral rinse Liqd   Gerhardt's butt cream Crea   guaiFENesin 100 MG/5ML liquid Commonly known as: ROBITUSSIN   lip  balm ointment   miconazole nitrate Powd Commonly known as: MICATIN   mupirocin ointment 2 % Commonly known as: BACTROBAN   nystatin 100000 UNIT/ML suspension Commonly known as: MYCOSTATIN   traMADol 50 MG tablet Commonly known as: ULTRAM       TAKE these medications    acetaminophen 500 MG tablet Commonly known as: TYLENOL Take 1,000 mg by mouth every 6 (six) hours as needed (for pain- CANNOT EXCEED A TOTAL OF 3,000 MG/DAY FROM ALL COMBINED SOURCES).   albuterol 108 (90 Base) MCG/ACT inhaler Commonly known as: VENTOLIN HFA Inhale 2 puffs into the lungs every 4 (four) hours  as needed for wheezing or shortness of breath.   allopurinol 100 MG tablet Commonly known as: ZYLOPRIM Take 100 mg by mouth daily.   amLODipine 10 MG tablet Commonly known as: NORVASC Take 10 mg by mouth daily.   Aspercreme Lidocaine 4 % Generic drug: lidocaine Place 1 patch onto the skin See admin instructions. Apply 1 patch to both knees once a day- remove as directed   Boost Glucose Control Liqd Take 237 mLs by mouth 2 (two) times daily between meals.   calamine lotion Apply 1 Application topically 2 (two) times daily.   carvedilol 6.25 MG tablet Commonly known as: COREG Take 6.25 mg by mouth 2 (two) times daily with a meal.   cholecalciferol 25 MCG (1000 UNIT) tablet Commonly known as: VITAMIN D3 Take 2,000 Units by mouth daily.   doxepin 25 MG capsule Commonly known as: SINEQUAN Take 25 mg by mouth at bedtime.   EUCERIN BABY EX Apply 1 application  topically See admin instructions. Apply to affected areas daily where the skin is peeling   famotidine 20 MG tablet Commonly known as: PEPCID Take 20 mg by mouth every 12 (twelve) hours.   fluticasone 50 MCG/ACT nasal spray Commonly known as: FLONASE Place 1 spray into both nostrils daily.   Gas-X Ultra Strength 180 MG Caps Generic drug: Simethicone Take 1 capsule by mouth every 8 (eight) hours as needed (gas).   glipiZIDE 10 MG 24 hr tablet Commonly known as: GLUCOTROL XL Take 10 mg by mouth in the morning and at bedtime.   hydrOXYzine 25 MG tablet Commonly known as: ATARAX Take 25 mg by mouth in the morning and at bedtime.   insulin aspart 100 UNIT/ML injection Commonly known as: NovoLOG Inject 0-12 Units into the skin See admin instructions. Inject 0-12 units into the skin 4 times a day (before meals and at bedtime) per sliding scale: BGL <60 or >400  = CALL MD; 60-200 = 0 units; 201-250 = 2 units; 251-300 = 4 units; 301-350 = 6 units; 351-400 = 8 units; 401-450 = 10 units; >450 = 12 units    ipratropium-albuterol 0.5-2.5 (3) MG/3ML Soln Commonly known as: DUONEB Take 3 mLs by nebulization 3 (three) times daily.   Lantus SoloStar 100 UNIT/ML Solostar Pen Generic drug: insulin glargine Inject 28-42 Units into the skin See admin instructions. Inject 42 units into the skin in the morning and 28 units at bedtime   lidocaine-prilocaine cream Commonly known as: EMLA Apply 1 Application topically as needed. What changed:  when to take this additional instructions   lisinopril 5 MG tablet Commonly known as: ZESTRIL Take 5 mg by mouth daily.   loperamide 2 MG capsule Commonly known as: IMODIUM Take 1 capsule (2 mg total) by mouth as needed for diarrhea or loose stools.   loratadine 10 MG tablet Commonly known as: CLARITIN Take 10  mg by mouth daily.   nystatin powder Commonly known as: MYCOSTATIN/NYSTOP Apply topically 2 (two) times daily.   polyethylene glycol powder 17 GM/SCOOP powder Commonly known as: GLYCOLAX/MIRALAX Take 17 g by mouth daily as needed for mild constipation (to be mixed into 4-8 ounces of a beverage).   Pro-Stat Liqd Take 30 mLs by mouth 2 (two) times daily between meals.   prochlorperazine 10 MG tablet Commonly known as: COMPAZINE Take 1 tablet (10 mg total) by mouth every 6 (six) hours as needed for nausea or vomiting.   Systane Balance 0.6 % Soln Generic drug: Propylene Glycol Place 1 drop into both eyes every 12 (twelve) hours.               Discharge Care Instructions  (From admission, onward)           Start     Ordered   05/24/22 0000  Discharge wound care:       Comments: 1. Apply antifungal powder BID and PRN to bilat breast and lower abd skin folds 2. Apply xeroform gauze to posterior back wounds Q day and cover with ABD pads; hold in place with mesh underwear with the crotch cut out to form a "tank top".   05/24/22 1047            Allergies  Allergen Reactions   Emend [Aprepitant] Other (See Comments)     Pt experienced shooting back pain at 10/10 when medication was administered the first time. See Hypersensitivity note from 01/17/2022.   Vancomycin Itching and Other (See Comments)    Patient becomes hypotensive, lethargic and itchy. Has tolerated it many times.   Fosaprepitant Other (See Comments)    Back pain / hypersensitivity 01/17/2022   Lasix [Furosemide] Other (See Comments)    IV-LASIX ==> Reaction: Burning   Lasix [Furosemide] Other (See Comments)    On MAR as allergic   Pineapple Rash      Procedures/Studies: [place] CHEST SOFT TISSUE  Result Date: 05/22/2022 CLINICAL DATA:  Redness around port site.  Assess for abscess EXAM: ULTRASOUND OF CHEST SOFT TISSUES TECHNIQUE: Ultrasound examination of the chest wall soft tissues was performed in the area of clinical concern. COMPARISON:  01/05/2022 FINDINGS: Targeted ultrasound was performed of the soft tissues of the right chest wall at site of patient's clinical concern. No soft tissue edema or fluid collections were identified within the underlying tissues. No mass lesion. Patient's port catheter is noted. IMPRESSION: No sonographic abnormality within the soft tissues of the right chest wall at site of patient's clinical concern. Electronically Signed   By: Duanne Guess D.O.   On: 05/22/2022 18:21   DG Chest Port 1 View  Result Date: 05/18/2022 CLINICAL DATA:  Fever. EXAM: PORTABLE CHEST 1 VIEW COMPARISON:  March 03, 2022. FINDINGS: Stable cardiomediastinal silhouette. Right internal jugular Port-A-Cath is noted with distal tip in expected position of the SVC. Minimal right midlung subsegmental atelectasis or scarring is noted. Left lung is clear. Bony thorax is unremarkable. IMPRESSION: Stable minimal right midlung subsegmental atelectasis or scarring. Aortic Atherosclerosis (ICD10-I70.0). Electronically Signed   By: Lupita Raider M.D.   On: 05/18/2022 12:01       Discharge Exam: Vitals:   05/23/22 1946 05/24/22 0532  BP: (!)  153/71 (!) 148/60  Pulse: 90 89  Resp: 16 14  Temp: 99.4 F (37.4 C) 98.1 F (36.7 C)  SpO2: 97% 91%    General: Pt is alert, awake, not in acute distress Cardiovascular: RRR,  S1/S2 +, no edema Respiratory: CTA bilaterally, no wheezing, no rhonchi, no respiratory distress, no conversational dyspnea  Abdominal: Soft, NT, ND, bowel sounds + Extremities: no edema, no cyanosis Psych: Normal mood and affect, stable judgement and insight     The results of significant diagnostics from this hospitalization (including imaging, microbiology, ancillary and laboratory) are listed below for reference.     Microbiology: Recent Results (from the past 240 hour(s))  Resp panel by RT-PCR (RSV, Flu A&B, Covid) Anterior Nasal Swab     Status: None   Collection Time: 05/18/22 11:44 AM   Specimen: Anterior Nasal Swab  Result Value Ref Range Status   SARS Coronavirus 2 by RT PCR NEGATIVE NEGATIVE Final    Comment: (NOTE) SARS-CoV-2 target nucleic acids are NOT DETECTED.  The SARS-CoV-2 RNA is generally detectable in upper respiratory specimens during the acute phase of infection. The lowest concentration of SARS-CoV-2 viral copies this assay can detect is 138 copies/mL. A negative result does not preclude SARS-Cov-2 infection and should not be used as the sole basis for treatment or other patient management decisions. A negative result may occur with  improper specimen collection/handling, submission of specimen other than nasopharyngeal swab, presence of viral mutation(s) within the areas targeted by this assay, and inadequate number of viral copies(<138 copies/mL). A negative result must be combined with clinical observations, patient history, and epidemiological information. The expected result is Negative.  Fact Sheet for Patients:  BloggerCourse.com  Fact Sheet for Healthcare Providers:  SeriousBroker.it  This test is no t yet approved  or cleared by the [place] FDA and  has been authorized for detection and/or diagnosis of SARS-CoV-2 by FDA under an Emergency Use Authorization (EUA). This EUA will remain  in effect (meaning this test can be used) for the duration of the COVID-19 declaration under Section 564(b)(1) of the Act, 21 U.S.C.section 360bbb-3(b)(1), unless the authorization is terminated  or revoked sooner.       Influenza A by PCR NEGATIVE NEGATIVE Final   Influenza B by PCR NEGATIVE NEGATIVE Final    Comment: (NOTE) The Xpert Xpress SARS-CoV-2/FLU/RSV plus assay is intended as an aid in the diagnosis of influenza from Nasopharyngeal swab specimens and should not be used as a sole basis for treatment. Nasal washings and aspirates are unacceptable for Xpert Xpress SARS-CoV-2/FLU/RSV testing.  Fact Sheet for Patients: BloggerCourse.com  Fact Sheet for Healthcare Providers: SeriousBroker.it  This test is not yet approved or cleared by the [place] FDA and has been authorized for detection and/or diagnosis of SARS-CoV-2 by FDA under an Emergency Use Authorization (EUA). This EUA will remain in effect (meaning this test can be used) for the duration of the COVID-19 declaration under Section 564(b)(1) of the Act, 21 U.S.C. section 360bbb-3(b)(1), unless the authorization is terminated or revoked.     Resp Syncytial Virus by PCR NEGATIVE NEGATIVE Final    Comment: (NOTE) Fact Sheet for Patients: BloggerCourse.com  Fact Sheet for Healthcare Providers: SeriousBroker.it  This test is not yet approved or cleared by the [place] FDA and has been authorized for detection and/or diagnosis of SARS-CoV-2 by FDA under an Emergency Use Authorization (EUA). This EUA will remain in effect (meaning this test can be used) for the duration of the COVID-19 declaration under Section 564(b)(1) of the Act,  21 U.S.C. section 360bbb-3(b)(1), unless the authorization is terminated or revoked.  Performed at [facility], [address], [place], [place] [address]   Blood Culture (routine x 2)  Status: None   Collection Time: 05/18/22 12:48 PM   Specimen: BLOOD  Result Value Ref Range Status   Specimen Description   Final    BLOOD Port Performed at [facility], [address], [place], [place] [address]    Special Requests   Final    BOTTLES DRAWN AEROBIC AND ANAEROBIC Blood Culture adequate volume Performed at [facility], [address], [place], [place] [address]    Culture   Final    NO GROWTH 5 DAYS Performed at [facility], [address] [place], [place] [address]    Report Status 05/23/2022 FINAL  Final  Blood Culture (routine x 2)     Status: None   Collection Time: 05/18/22 12:48 PM   Specimen: BLOOD  Result Value Ref Range Status   Specimen Description   Final    BLOOD PORTA CATH Performed at [facility], [address] [place], [place] [address]    Special Requests   Final    BOTTLES DRAWN AEROBIC AND ANAEROBIC Blood Culture adequate volume Performed at [facility], [address], [place], [place] [address]    Culture   Final    NO GROWTH 5 DAYS Performed at [facility], [address] [place], [place] [address]    Report Status 05/23/2022 FINAL  Final  MRSA Next Gen by PCR, Nasal     Status: Abnormal   Collection Time: 05/19/22 11:07 AM   Specimen: Nasal Mucosa; Nasal Swab  Result Value Ref Range Status   MRSA by PCR Next Gen DETECTED (A) NOT DETECTED Final    Comment: (NOTE) The GeneXpert MRSA Assay (FDA approved for NASAL specimens only), is one component of a comprehensive MRSA colonization surveillance program. It is not intended to diagnose MRSA infection nor to guide or monitor treatment for MRSA infections. Test performance is not FDA  approved in patients less than 59 years old. Performed at [facility], [address] [place], [place] [address]   Culture, blood (Routine X 2) w Reflex to ID Panel     Status: None   Collection Time: 05/19/22  3:58 PM   Specimen: BLOOD RIGHT HAND  Result Value Ref Range Status   Specimen Description   Final    BLOOD RIGHT HAND Performed at [facility], [address], [place], [place] [address]    Special Requests   Final    BOTTLES DRAWN AEROBIC ONLY Blood Culture results may not be optimal due to an inadequate volume of blood received in culture bottles Performed at [facility], [address] [place], [place] [address]    Culture   Final    NO GROWTH 5 DAYS Performed at [facility], [address], [place], [place] [address]    Report Status 05/24/2022 FINAL  Final  Culture, blood (Routine X 2) w Reflex to ID Panel     Status: None   Collection Time: 05/19/22  4:05 PM   Specimen: BLOOD LEFT ARM  Result Value Ref Range Status   Specimen Description   Final    BLOOD LEFT ARM Performed at [facility], [address] [place], [place] [address]    Special Requests   Final    BOTTLES DRAWN AEROBIC ONLY Blood Culture results may not be optimal due to an inadequate volume of blood received in culture bottles Performed at [facility], [address] [place], [place] [address]    Culture   Final    NO GROWTH 5 DAYS Performed at [facility], [address], [place], [place] [address]    Report Status 05/24/2022 FINAL  Final     Labs: BNP (last 3 results) Recent Labs    03/03/22 0939  BNP 170.6*   Basic Metabolic Panel: Recent Labs  Lab 05/20/22 0604 05/21/22 0619 05/22/22 0615 05/23/22 0547 05/24/22 0528  NA 135 137 136 135 136  K 3.3* 3.5 3.1* 3.1* 3.7  CL 106 110 106 107 105  CO2 20* 20* 22 23 25   GLUCOSE 117* 197* 220* 245* 231*  BUN 23 17 11 11 9   CREATININE 0.89 0.73  0.66 0.62 0.63  CALCIUM 8.2* 8.2* 7.9* 7.9* 8.3*  MG 1.3* 2.0 1.7 1.5* 2.0   Liver Function Tests: Recent Labs  Lab 05/18/22 1248  AST 19  ALT 30  ALKPHOS 107  BILITOT 1.0  PROT 6.3*  ALBUMIN 3.2*   No results for input(s): "LIPASE", "AMYLASE" in the last 168 hours. No results for input(s): "AMMONIA" in the last 168 hours. CBC: Recent Labs  Lab 05/20/22 0604 05/21/22 0619 05/22/22 0615 05/23/22 0547 05/24/22 0528  WBC 4.6 7.4 8.0 9.6 11.2*  NEUTROABS 2.9 4.3 4.5 5.3 6.4  HGB 7.2* 7.6* 7.1* 7.1* 7.3*  HCT 22.0* 23.2* 22.2* 22.6* 22.7*  MCV 92.1 93.2 93.7 94.2 93.8  PLT 81* 101* 103* 131* 154   Cardiac Enzymes: No results for input(s): "CKTOTAL", "CKMB", "CKMBINDEX", "TROPONINI" in the last 168 hours. BNP: Invalid input(s): "POCBNP" CBG: Recent Labs  Lab 05/23/22 0739 05/23/22 1200 05/23/22 1649 05/23/22 2100 05/24/22 0810  GLUCAP 223* 302* 240* 202* 210*   D-Dimer No results for input(s): "DDIMER" in the last 72 hours. Hgb A1c No results for input(s): "HGBA1C" in the last 72 hours. Lipid Profile No results for input(s): "CHOL", "HDL", "LDLCALC", "TRIG", "CHOLHDL", "LDLDIRECT" in the last 72 hours. Thyroid function studies No results for input(s): "TSH", "T4TOTAL", "T3FREE", "THYROIDAB" in the last 72 hours.  Invalid input(s): "FREET3" Anemia work up No results for input(s): "VITAMINB12", "FOLATE", "FERRITIN", "TIBC", "IRON", "RETICCTPCT" in the last 72 hours. Urinalysis    Component Value Date/Time   COLORURINE AMBER (A) 05/18/2022 1248   APPEARANCEUR HAZY (A) 05/18/2022 1248   LABSPEC 1.025 05/18/2022 1248   PHURINE 5.0 05/18/2022 1248   GLUCOSEU NEGATIVE 05/18/2022 1248   HGBUR NEGATIVE 05/18/2022 1248   BILIRUBINUR NEGATIVE 05/18/2022 1248   KETONESUR NEGATIVE 05/18/2022 1248   PROTEINUR 30 (A) 05/18/2022 1248   UROBILINOGEN 0.2 12/27/2010 1858   NITRITE NEGATIVE 05/18/2022 1248   LEUKOCYTESUR NEGATIVE 05/18/2022 1248   Sepsis Labs Recent  Labs  Lab 05/21/22 0619 05/22/22 0615 05/23/22 0547 05/24/22 0528  WBC 7.4 8.0 9.6 11.2*   Microbiology Recent Results (from the past 240 hour(s))  Resp panel by RT-PCR (RSV, Flu A&B, Covid) Anterior Nasal Swab     Status: None   Collection Time: 05/18/22 11:44 AM   Specimen: Anterior Nasal Swab  Result Value Ref Range Status   SARS Coronavirus 2 by RT PCR NEGATIVE NEGATIVE Final    Comment: (NOTE) SARS-CoV-2 target nucleic acids are NOT DETECTED.  The SARS-CoV-2 RNA is generally detectable in upper respiratory specimens during the acute phase of infection. The lowest concentration of SARS-CoV-2 viral copies this assay can detect is 138 copies/mL. A negative result does not preclude SARS-Cov-2 infection and should not be used as the sole basis for  treatment or other patient management decisions. A negative result may occur with  improper specimen collection/handling, submission of specimen other than nasopharyngeal swab, presence of viral mutation(s) within the areas targeted by this assay, and inadequate number of viral copies(<138 copies/mL). A negative result must be combined with clinical observations, patient history, and epidemiological information. The expected result is Negative.  Fact Sheet for Patients:  BloggerCourse.com  Fact Sheet for Healthcare Providers:  SeriousBroker.it  This test is no t yet approved or cleared by the [place] FDA and  has been authorized for detection and/or diagnosis of SARS-CoV-2 by FDA under an Emergency Use Authorization (EUA). This EUA will remain  in effect (meaning this test can be used) for the duration of the COVID-19 declaration under Section 564(b)(1) of the Act, 21 U.S.C.section 360bbb-3(b)(1), unless the authorization is terminated  or revoked sooner.       Influenza A by PCR NEGATIVE NEGATIVE Final   Influenza B by PCR NEGATIVE NEGATIVE Final    Comment: (NOTE) The  Xpert Xpress SARS-CoV-2/FLU/RSV plus assay is intended as an aid in the diagnosis of influenza from Nasopharyngeal swab specimens and should not be used as a sole basis for treatment. Nasal washings and aspirates are unacceptable for Xpert Xpress SARS-CoV-2/FLU/RSV testing.  Fact Sheet for Patients: BloggerCourse.com  Fact Sheet for Healthcare Providers: SeriousBroker.it  This test is not yet approved or cleared by the [place] FDA and has been authorized for detection and/or diagnosis of SARS-CoV-2 by FDA under an Emergency Use Authorization (EUA). This EUA will remain in effect (meaning this test can be used) for the duration of the COVID-19 declaration under Section 564(b)(1) of the Act, 21 U.S.C. section 360bbb-3(b)(1), unless the authorization is terminated or revoked.     Resp Syncytial Virus by PCR NEGATIVE NEGATIVE Final    Comment: (NOTE) Fact Sheet for Patients: BloggerCourse.com  Fact Sheet for Healthcare Providers: SeriousBroker.it  This test is not yet approved or cleared by the [place] FDA and has been authorized for detection and/or diagnosis of SARS-CoV-2 by FDA under an Emergency Use Authorization (EUA). This EUA will remain in effect (meaning this test can be used) for the duration of the COVID-19 declaration under Section 564(b)(1) of the Act, 21 U.S.C. section 360bbb-3(b)(1), unless the authorization is terminated or revoked.  Performed at [facility], [address] [place], [place] [address]   Blood Culture (routine x 2)     Status: None   Collection Time: 05/18/22 12:48 PM   Specimen: BLOOD  Result Value Ref Range Status   Specimen Description   Final    BLOOD Port Performed at [facility], [address] [place], [place] [address]    Special Requests   Final    BOTTLES DRAWN AEROBIC AND ANAEROBIC  Blood Culture adequate volume Performed at [facility], [address] [place], [place] [address]    Culture   Final    NO GROWTH 5 DAYS Performed at [facility], [address] [place], [place] [address]    Report Status 05/23/2022 FINAL  Final  Blood Culture (routine x 2)     Status: None   Collection Time: 05/18/22 12:48 PM   Specimen: BLOOD  Result Value Ref Range Status   Specimen Description   Final    BLOOD PORTA CATH Performed at [facility], [address] [place], [place] [address]    Special Requests   Final  BOTTLES DRAWN AEROBIC AND ANAEROBIC Blood Culture adequate volume Performed at [facility], [address] [place], [place] [address]    Culture   Final    NO GROWTH 5 DAYS Performed at [facility], [address], [place], [place] [address]    Report Status 05/23/2022 FINAL  Final  MRSA Next Gen by PCR, Nasal     Status: Abnormal   Collection Time: 05/19/22 11:07 AM   Specimen: Nasal Mucosa; Nasal Swab  Result Value Ref Range Status   MRSA by PCR Next Gen DETECTED (A) NOT DETECTED Final    Comment: (NOTE) The GeneXpert MRSA Assay (FDA approved for NASAL specimens only), is one component of a comprehensive MRSA colonization surveillance program. It is not intended to diagnose MRSA infection nor to guide or monitor treatment for MRSA infections. Test performance is not FDA approved in patients less than 14 years old. Performed at [facility], [address], [place], [place] [address]   Culture, blood (Routine X 2) w Reflex to ID Panel     Status: None   Collection Time: 05/19/22  3:58 PM   Specimen: BLOOD RIGHT HAND  Result Value Ref Range Status   Specimen Description   Final    BLOOD RIGHT HAND Performed at [facility], [address], [place], [place] [address]    Special Requests   Final    BOTTLES DRAWN AEROBIC ONLY Blood Culture results may  not be optimal due to an inadequate volume of blood received in culture bottles Performed at [facility], [address], [place], [place] [address]    Culture   Final    NO GROWTH 5 DAYS Performed at [facility], [address], [place], [place] [address]    Report Status 05/24/2022 FINAL  Final  Culture, blood (Routine X 2) w Reflex to ID Panel     Status: None   Collection Time: 05/19/22  4:05 PM   Specimen: BLOOD LEFT ARM  Result Value Ref Range Status   Specimen Description   Final    BLOOD LEFT ARM Performed at [facility], [address], [place], [place] [address]    Special Requests   Final    BOTTLES DRAWN AEROBIC ONLY Blood Culture results may not be optimal due to an inadequate volume of blood received in culture bottles Performed at [facility], [address], [place], [place] [address]    Culture   Final    NO GROWTH 5 DAYS Performed at [facility], [address] [place], [place] [address]    Report Status 05/24/2022 FINAL  Final     Patient was seen and examined on the day of discharge and was found to be in stable condition. Time coordinating discharge: 40 minutes including assessment and coordination of care, as well as examination of the patient.   SIGNED:  Noralee Stain, DO [facility] 05/24/2022, 10:47 AM

## 2022-05-24 NOTE — Progress Notes (Signed)
Pharmacy Antibiotic Note  Heather Petty is a 72 y.o. female admitted on 05/18/2022 with fever. Patient is on chemotherapy, last received treatment 4/2. She received peg-filgrastim 4/4. Patient presented with neutropenia with self-reported fever PTA. Patient previously with port infection, which was removed and reimplanted in March. Pharmacy was consulted for vancomycin dosing for suspected cellulitis at the port.  ID consulted - port appears to be more irritated than infected at this time. Ultrasound negative for fluid collection. Current plan to leave port in place and treat with 7 days of antibiotics.  Plan: Antibiotics to finish today  Height:  (157.5 cm) Weight: 93.7 kg (206 lb 9.1 oz) IBW/kg (Calculated) : 50.1  Temp (24hrs), Avg:98.8 F (37.1 C), Min:98.1 F (36.7 C), Max:99.4 F (37.4 C)  Recent Labs  Lab 05/18/22 1248 05/19/22 0505 05/20/22 0604 05/21/22 0619 05/22/22 0615 05/23/22 0547 05/24/22 0528  WBC 1.3*   < > 4.6 7.4 8.0 9.6 11.2*  CREATININE 1.00   < > 0.89 0.73 0.66 0.62 0.63  LATICACIDVEN 1.4  --   --   --   --   --   --    < > = values in this interval not displayed.     Estimated Creatinine Clearance: 66.7 mL/min (by C-G formula based on SCr of 0.63 mg/dL).    Allergies  Allergen Reactions   Emend [Aprepitant] Other (See Comments)    Pt experienced shooting back pain at 10/10 when medication was administered the first time. See Hypersensitivity note from 01/17/2022.   Vancomycin Itching and Other (See Comments)    Patient becomes hypotensive, lethargic and itchy. Has tolerated it many times.   Fosaprepitant Other (See Comments)    Back pain / hypersensitivity 01/17/2022   Lasix [Furosemide] Other (See Comments)    IV-LASIX ==> Reaction: Burning   Lasix [Furosemide] Other (See Comments)    On MAR as allergic   Pineapple Rash    Antimicrobials this admission: Vancomycin 4/11 >> 4/17 Cefepime 4/11 >> 4/17  Microbiology results: 4/11 blood  x 2 PAC : ngF 4/12 MRSA + 4/12 BCXx2 peripheral: ngF   Thank you for allowing pharmacy to be a part of this patient's care.  Pricilla Riffle, PharmD, BCPS Clinical Pharmacist 05/24/2022 8:05 AM

## 2022-05-24 NOTE — Progress Notes (Signed)
Patient refused all antibiotics this morning. RN spoke to patient about the importance of her medication regimen and patient states the antibiotics make her itch, MD Alvino Chapel paged and Infectious disease pharmacist notified.

## 2022-05-24 NOTE — Plan of Care (Addendum)
Report called to Lauris Poag, RN.  1300-PETAR picked up patient, Port de accessed.   Problem: Education: Goal: Knowledge of General Education information will improve Description: Including pain rating scale, medication(s)/side effects and non-pharmacologic comfort measures Outcome: Progressing   Problem: Health Behavior/Discharge Planning: Goal: Ability to manage health-related needs will improve Outcome: Progressing   Problem: Clinical Measurements: Goal: Ability to maintain clinical measurements within normal limits will improve Outcome: Progressing Goal: Will remain free from infection Outcome: Progressing Goal: Diagnostic test results will improve Outcome: Progressing Goal: Respiratory complications will improve Outcome: Progressing Goal: Cardiovascular complication will be avoided Outcome: Progressing   Problem: Activity: Goal: Risk for activity intolerance will decrease Outcome: Progressing   Problem: Nutrition: Goal: Adequate nutrition will be maintained Outcome: Progressing   Problem: Coping: Goal: Level of anxiety will decrease Outcome: Progressing   Problem: Elimination: Goal: Will not experience complications related to bowel motility Outcome: Progressing Goal: Will not experience complications related to urinary retention Outcome: Progressing   Problem: Pain Managment: Goal: General experience of comfort will improve Outcome: Progressing   Problem: Safety: Goal: Ability to remain free from injury will improve Outcome: Progressing   Problem: Skin Integrity: Goal: Risk for impaired skin integrity will decrease Outcome: Progressing

## 2022-05-24 NOTE — [place] FL2 (Signed)
[place] MEDICAID FL2 LEVEL OF CARE FORM     IDENTIFICATION  Patient Name: Heather Petty Birthdate: Mar 24, 1950 Sex: female Admission Date (Current Location): 05/18/2022  [place] and [place] Number:  Haynes Bast [id number] P Facility and Address:  [facility],  [address] [place], [place] [address]      Provider Number: [id number]  Attending Physician Name and Address:  Noralee Stain, DO  Relative Name and Phone Number:  [name] [phone]    Current Level of Care: SNF Recommended Level of Care: Skilled Nursing Facility Prior Approval Number:    Date Approved/Denied:   PASRR Number: [id number]  Discharge Plan: SNF    Current Diagnoses: Patient Active Problem List   Diagnosis Date Noted   Skin maceration 05/23/2022   Hyponatremia 05/18/2022   Neutropenic fever 02/23/2022   Port-A-Cath in place 02/15/2022   Febrile neutropenia 02/11/2022   Hodgkin lymphoma 01/10/2022   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   Candidal intertrigo 09/25/2019   CAP (community acquired pneumonia) 09/03/2019   Uncontrolled type 2 diabetes mellitus with hyperglycemia 09/03/2019   Elevated LFTs 09/03/2019   Essential hypertension 09/03/2019   Gout 09/03/2019   Asthma 09/03/2019   Anemia 09/03/2019   Pneumonia 09/03/2019   Splenomegaly    Anemia 08/05/2019   Diarrhea 08/05/2019   DKA (diabetic ketoacidoses) 07/22/2019   Rhabdomyolysis 07/22/2019   Acute metabolic encephalopathy 07/22/2019   MRSA bacteremia 07/22/2019   Enterococcal bacteremia 07/22/2019   Vitiligo 07/22/2019   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   Sepsis 07/19/2018   Ventricular tachycardia, sustained 07/19/2018   NSTEMI (non-ST elevated myocardial infarction) 07/19/2018   Wound infection 04/03/2018   Morbid obesity with BMI of 50.0-59.9, adult 04/03/2018   Elevated alkaline phosphatase level 04/03/2018   Asthma exacerbation 03/14/2018   Lactic acidosis 03/14/2018   GERD (gastroesophageal  reflux disease) 03/14/2018   Acute renal failure superimposed on stage 3 chronic kidney disease 03/14/2018   Macrocytic anemia 03/14/2018   Abnormal LFTs 03/14/2018   Acute kidney injury 02/28/2018   Wound of sacral region 02/28/2018   Hypothermia 02/28/2018   Stage II pressure ulcer of sacral region 02/15/2018   DM II (diabetes mellitus, type II), controlled 02/09/2018   Schizophrenia 02/09/2018   Decubitus ulcer of right leg 02/06/2018   Acute renal failure (ARF) 02/05/2018   Hyperlipidemia associated with type 2 diabetes mellitus 02/05/2018   Hyperkalemia 02/05/2018   Dermatitis 02/05/2018   Open back wound 02/05/2018   Dysuria 02/05/2018   Paranoid schizophrenia    AKI (acute kidney injury) 12/17/2017   Type II diabetes mellitus with renal manifestations 12/17/2017   HTN (hypertension) 12/17/2017   Gout     Orientation RESPIRATION BLADDER Height & Weight     Self, Time, Situation, Place  Normal Incontinent Weight: 93.7 kg Height:  5\' 2"  (157.5 cm)  BEHAVIORAL SYMPTOMS/MOOD NEUROLOGICAL BOWEL NUTRITION STATUS     (n/a) Continent Diet  AMBULATORY STATUS COMMUNICATION OF NEEDS Skin   Total Care (bedfast) Verbally Other (Comment) (back mid red moist ,macerated partial thickness with gauze dressing)                       Personal Care Assistance Level of Assistance  Bathing, Feeding, Dressing Bathing Assistance: Limited assistance Feeding assistance: Limited assistance Dressing Assistance: Limited assistance     Functional Limitations Info  Sight, Hearing, Speech Sight Info: Impaired Hearing Info: Adequate Speech Info: Adequate    SPECIAL CARE FACTORS FREQUENCY  Contractures Contractures Info: Not present    Additional Factors Info  Code Status, Allergies, Psychotropic, Insulin Sliding Scale, Isolation Precautions, Suctioning Needs Code Status Info: FUll Allergies Info: mend (Aprepitant), Vancomycin, Fosaprepitant, Lasix  (Furosemide), Lasix (Furosemide), Pineapple Psychotropic Info: see discharge summary Insulin Sliding Scale Info: see discharge summary Isolation Precautions Info: n/a Suctioning Needs: n/a   Current Medications (05/24/2022):  This is the current hospital active medication list Current Facility-Administered Medications  Medication Dose Route Frequency Provider Last Rate Last Admin   acetaminophen (TYLENOL) tablet 650 mg  650 mg Oral Q6H PRN Kirby Crigler, Mir M, MD   650 mg at 05/20/22 2125   Or   acetaminophen (TYLENOL) suppository 650 mg  650 mg Rectal Q6H PRN Kirby Crigler, Mir M, MD       allopurinol (ZYLOPRIM) tablet 100 mg  100 mg Oral Daily Kirby Crigler, Mir M, MD   100 mg at 05/24/22 0820   ceFEPIme (MAXIPIME) 2 g in sodium chloride 0.9 % 100 mL IVPB  2 g Intravenous Q8H Gardiner Barefoot, MD 200 mL/hr at 05/23/22 1426 2 g at 05/23/22 1426   Chlorhexidine Gluconate Cloth 2 % PADS 6 each  6 each Topical Daily Lewie Chamber, MD   6 each at 05/24/22 1002   diphenhydrAMINE (BENADRYL) capsule 25 mg  25 mg Oral Q6H PRN Lewie Chamber, MD       docusate sodium (COLACE) capsule 100 mg  100 mg Oral BID Kirby Crigler, Mir M, MD   100 mg at 05/19/22 1610   doxepin (SINEQUAN) capsule 25 mg  25 mg Oral Axel Filler, MD   25 mg at 05/23/22 2057   enoxaparin (LOVENOX) injection 40 mg  40 mg Subcutaneous Q24H Kirby Crigler, Mir M, MD   40 mg at 05/23/22 2057   famotidine (PEPCID) tablet 20 mg  20 mg Oral Orlene Erm, MD   20 mg at 05/24/22 0820   hydrOXYzine (ATARAX) tablet 25 mg  25 mg Oral TID PRN Lewie Chamber, MD   25 mg at 05/23/22 1653   insulin aspart (novoLOG) injection 0-15 Units  0-15 Units Subcutaneous TID WC Maryln Gottron, MD   5 Units at 05/24/22 9604   insulin aspart (novoLOG) injection 0-5 Units  0-5 Units Subcutaneous QHS Maryln Gottron, MD   2 Units at 05/22/22 2158   insulin glargine-yfgn [facility]) injection 14 Units  14 Units Subcutaneous Daily Lewie Chamber, MD   14 Units at  05/24/22 5409   levalbuterol (XOPENEX) nebulizer solution 0.63 mg  0.63 mg Nebulization Q8H PRN Kirby Crigler, Mir M, MD       morphine (PF) 2 MG/ML injection 2 mg  2 mg Intravenous Q4H PRN Lewie Chamber, MD       mupirocin ointment (BACTROBAN) 2 % 1 Application  1 Application Nasal BID Lewie Chamber, MD   1 Application at 05/24/22 8119   nystatin (MYCOSTATIN/NYSTOP) topical powder   Topical BID Lewie Chamber, MD   Given at 05/24/22 0826   polyethylene glycol (MIRALAX / GLYCOLAX) packet 17 g  17 g Oral Daily PRN Kirby Crigler, Mir M, MD       traZODone (DESYREL) tablet 25 mg  25 mg Oral QHS PRN Kirby Crigler, Mir M, MD   25 mg at 05/19/22 2348   vancomycin (VANCOCIN) IVPB 1000 mg/200 mL premix  1,000 mg Intravenous Q24H Gardiner Barefoot, MD 200 mL/hr at 05/23/22 1657 1,000 mg at 05/23/22 1657   Facility-Administered Medications Ordered in Other Encounters  Medication Dose Route Frequency Provider Last Rate  Last Admin   0.9 %  sodium chloride infusion   Intravenous Continuous Allred, Darrell K, PA-C         Discharge Medications: Please see discharge summary for a list of discharge medications.  Relevant Imaging Results:  Relevant Lab Results:   Additional Information SS# [id number]  Beckie Busing, RN

## 2022-05-25 ENCOUNTER — Inpatient Hospital Stay: Payer: Medicare Other

## 2022-06-05 MED FILL — Dexamethasone Sodium Phosphate Inj 100 MG/10ML: INTRAMUSCULAR | Qty: 1 | Status: AC

## 2022-06-06 ENCOUNTER — Inpatient Hospital Stay (HOSPITAL_BASED_OUTPATIENT_CLINIC_OR_DEPARTMENT_OTHER): Payer: Medicare Other | Admitting: Physician Assistant

## 2022-06-06 ENCOUNTER — Telehealth: Payer: Self-pay | Admitting: Physician Assistant

## 2022-06-06 ENCOUNTER — Ambulatory Visit: Payer: Medicare Other

## 2022-06-06 ENCOUNTER — Other Ambulatory Visit: Payer: Self-pay

## 2022-06-06 ENCOUNTER — Inpatient Hospital Stay: Payer: Medicare Other

## 2022-06-06 DIAGNOSIS — Z5111 Encounter for antineoplastic chemotherapy: Secondary | ICD-10-CM | POA: Diagnosis present

## 2022-06-06 DIAGNOSIS — C8198 Hodgkin lymphoma, unspecified, lymph nodes of multiple sites: Secondary | ICD-10-CM

## 2022-06-06 DIAGNOSIS — C8173 Other classical Hodgkin lymphoma, intra-abdominal lymph nodes: Secondary | ICD-10-CM | POA: Diagnosis not present

## 2022-06-06 DIAGNOSIS — Z95828 Presence of other vascular implants and grafts: Secondary | ICD-10-CM

## 2022-06-06 DIAGNOSIS — Z5189 Encounter for other specified aftercare: Secondary | ICD-10-CM | POA: Diagnosis not present

## 2022-06-06 LAB — CBC WITH DIFFERENTIAL (CANCER CENTER ONLY)
Abs Immature Granulocytes: 0.02 10*3/uL (ref 0.00–0.07)
Basophils Absolute: 0.1 10*3/uL (ref 0.0–0.1)
Basophils Relative: 1 %
Eosinophils Absolute: 0.5 10*3/uL (ref 0.0–0.5)
Eosinophils Relative: 6 %
HCT: 31.6 % — ABNORMAL LOW (ref 36.0–46.0)
Hemoglobin: 10 g/dL — ABNORMAL LOW (ref 12.0–15.0)
Immature Granulocytes: 0 %
Lymphocytes Relative: 24 %
Lymphs Abs: 2.1 10*3/uL (ref 0.7–4.0)
MCH: 31.8 pg (ref 26.0–34.0)
MCHC: 31.6 g/dL (ref 30.0–36.0)
MCV: 100.6 fL — ABNORMAL HIGH (ref 80.0–100.0)
Monocytes Absolute: 0.6 10*3/uL (ref 0.1–1.0)
Monocytes Relative: 7 %
Neutro Abs: 5.3 10*3/uL (ref 1.7–7.7)
Neutrophils Relative %: 62 %
Platelet Count: 220 10*3/uL (ref 150–400)
RBC: 3.14 MIL/uL — ABNORMAL LOW (ref 3.87–5.11)
RDW: 19.7 % — ABNORMAL HIGH (ref 11.5–15.5)
WBC Count: 8.6 10*3/uL (ref 4.0–10.5)
nRBC: 0 % (ref 0.0–0.2)

## 2022-06-06 LAB — CMP (CANCER CENTER ONLY)
ALT: 12 U/L (ref 0–44)
AST: 16 U/L (ref 15–41)
Albumin: 4 g/dL (ref 3.5–5.0)
Alkaline Phosphatase: 146 U/L — ABNORMAL HIGH (ref 38–126)
Anion gap: 6 (ref 5–15)
BUN: 11 mg/dL (ref 8–23)
CO2: 28 mmol/L (ref 22–32)
Calcium: 9.8 mg/dL (ref 8.9–10.3)
Chloride: 105 mmol/L (ref 98–111)
Creatinine: 0.76 mg/dL (ref 0.44–1.00)
GFR, Estimated: >60 mL/min (ref >60)
Glucose, Bld: 137 mg/dL — ABNORMAL HIGH (ref 70–99)
Potassium: 4 mmol/L (ref 3.5–5.1)
Sodium: 139 mmol/L (ref 135–145)
Total Bilirubin: 0.4 mg/dL (ref 0.3–1.2)
Total Protein: 6.7 g/dL (ref 6.5–8.1)

## 2022-06-06 MED ORDER — SODIUM CHLORIDE 0.9 % IV SOLN: Freq: Once | INTRAVENOUS | Status: AC

## 2022-06-06 MED ORDER — HEPARIN SOD (PORK) LOCK FLUSH 100 UNIT/ML IV SOLN
500.0000 [IU] | Freq: Once | INTRAVENOUS | Status: AC | PRN
  Administered 2022-06-06: 500 [IU]

## 2022-06-06 MED ORDER — VINBLASTINE SULFATE CHEMO INJECTION 1 MG/ML
6.0000 mg/m2 | Freq: Once | INTRAVENOUS | Status: AC
  Administered 2022-06-06: 12.5 mg via INTRAVENOUS
  Filled 2022-06-06: qty 12.5

## 2022-06-06 MED ORDER — SODIUM CHLORIDE 0.9% FLUSH
10.0000 mL | INTRAVENOUS | Status: DC | PRN
  Administered 2022-06-06: 10 mL

## 2022-06-06 MED ORDER — SODIUM CHLORIDE 0.9 % IV SOLN
375.0000 mg/m2 | Freq: Once | INTRAVENOUS | Status: AC
  Administered 2022-06-06: 800 mg via INTRAVENOUS
  Filled 2022-06-06: qty 80

## 2022-06-06 MED ORDER — SODIUM CHLORIDE 0.9% FLUSH
10.0000 mL | Freq: Once | INTRAVENOUS | Status: AC
  Administered 2022-06-06: 10 mL

## 2022-06-06 MED ORDER — DOXORUBICIN HCL CHEMO IV INJECTION 2 MG/ML
25.0000 mg/m2 | Freq: Once | INTRAVENOUS | Status: AC
  Administered 2022-06-06: 52 mg via INTRAVENOUS
  Filled 2022-06-06: qty 26

## 2022-06-06 MED ORDER — PALONOSETRON HCL INJECTION 0.25 MG/5ML
0.2500 mg | Freq: Once | INTRAVENOUS | Status: AC
  Administered 2022-06-06: 0.25 mg via INTRAVENOUS

## 2022-06-06 MED ORDER — SODIUM CHLORIDE 0.9 % IV SOLN
10.0000 mg | Freq: Once | INTRAVENOUS | Status: AC
  Administered 2022-06-06: 10 mg via INTRAVENOUS
  Filled 2022-06-06: qty 1
  Filled 2022-06-06: qty 10

## 2022-06-06 NOTE — Patient Instructions (Signed)
[facility]  Discharge Instructions: Thank you for choosing [facility] to provide your oncology and hematology care.   If you have a lab appointment with the Cancer Center, please go directly to the Cancer Center and check in at the registration area.   Wear comfortable clothing and clothing appropriate for easy access to any Portacath or PICC line.   We strive to give you quality time with your provider. You may need to reschedule your appointment if you arrive late (15 or more minutes).  Arriving late affects you and other patients whose appointments are after yours.  Also, if you miss three or more appointments without notifying the office, you may be dismissed from the clinic at the provider's discretion.      For prescription refill requests, have your pharmacy contact our office and allow 72 hours for refills to be completed.    Today you received the following chemotherapy and/or immunotherapy agents Adriamycin, Velban, DTIC.      To help prevent nausea and vomiting after your treatment, we encourage you to take your nausea medication as directed.  BELOW ARE SYMPTOMS THAT SHOULD BE REPORTED IMMEDIATELY: *FEVER GREATER THAN 100.4 F (38 C) OR HIGHER *CHILLS OR SWEATING *NAUSEA AND VOMITING THAT IS NOT CONTROLLED WITH YOUR NAUSEA MEDICATION *UNUSUAL SHORTNESS OF BREATH *UNUSUAL BRUISING OR BLEEDING *URINARY PROBLEMS (pain or burning when urinating, or frequent urination) *BOWEL PROBLEMS (unusual diarrhea, constipation, pain near the anus) TENDERNESS IN MOUTH AND THROAT WITH OR WITHOUT PRESENCE OF ULCERS (sore throat, sores in mouth, or a toothache) UNUSUAL RASH, SWELLING OR PAIN  UNUSUAL VAGINAL DISCHARGE OR ITCHING   Items with * indicate a potential emergency and should be followed up as soon as possible or go to the Emergency Department if any problems should occur.  Please show the CHEMOTHERAPY ALERT CARD or IMMUNOTHERAPY  ALERT CARD at check-in to the Emergency Department and triage nurse.  Should you have questions after your visit or need to cancel or reschedule your appointment, please contact [facility]  Dept: [phone]  and follow the prompts.  Office hours are 8:00 a.m. to 4:30 p.m. Monday - Friday. Please note that voicemails left after 4:00 p.m. may not be returned until the following business day.  We are closed weekends and major holidays. You have access to a nurse at all times for urgent questions. Please call the main number to the clinic Dept: [phone] and follow the prompts.   For any non-urgent questions, you may also contact your provider using MyChart. We now offer e-Visits for anyone 18 and older to request care online for non-urgent symptoms. For details visit [email or web address].   Also download the MyChart app! Go to the app store, search "MyChart", open the app, select [facility], and log in with your MyChart username and password.   

## 2022-06-06 NOTE — Telephone Encounter (Signed)
Reached out to patient to schedule per secure chat, [facility] took the appointment details and will give to patient.

## 2022-06-07 ENCOUNTER — Other Ambulatory Visit: Payer: Self-pay

## 2022-06-07 ENCOUNTER — Inpatient Hospital Stay: Payer: Medicare Other | Attending: Physician Assistant

## 2022-06-07 ENCOUNTER — Telehealth: Payer: Self-pay | Admitting: Physician Assistant

## 2022-06-07 ENCOUNTER — Encounter: Payer: Self-pay | Admitting: Hematology and Oncology

## 2022-06-07 ENCOUNTER — Encounter: Payer: Self-pay | Admitting: Physician Assistant

## 2022-06-07 VITALS — BP 144/58 | HR 72 | Temp 99.1°F | Resp 18

## 2022-06-07 DIAGNOSIS — C8198 Hodgkin lymphoma, unspecified, lymph nodes of multiple sites: Secondary | ICD-10-CM

## 2022-06-07 DIAGNOSIS — Z5189 Encounter for other specified aftercare: Secondary | ICD-10-CM | POA: Insufficient documentation

## 2022-06-07 DIAGNOSIS — C8173 Other classical Hodgkin lymphoma, intra-abdominal lymph nodes: Secondary | ICD-10-CM | POA: Diagnosis present

## 2022-06-07 DIAGNOSIS — Z5111 Encounter for antineoplastic chemotherapy: Secondary | ICD-10-CM | POA: Insufficient documentation

## 2022-06-07 MED ORDER — PEGFILGRASTIM-CBQV 6 MG/0.6ML ~~LOC~~ SOSY
6.0000 mg | PREFILLED_SYRINGE | Freq: Once | SUBCUTANEOUS | Status: AC
  Administered 2022-06-07: 6 mg via SUBCUTANEOUS
  Filled 2022-06-07: qty 0.6

## 2022-06-07 NOTE — Telephone Encounter (Signed)
Per  Karena Addison request to see patient at next infusion changed appointment, reached out to let [facility] place now. Left voicemail.

## 2022-06-07 NOTE — Progress Notes (Signed)
[facility] Telephone:([phone]) [phone]   [phone]  PROGRESS NOTE  Patient Care Team: System, Provider Not In as PCP - General Fleet Contras, MD (Internal Medicine)  Hematological/Oncological History # Hodkgin's Lymphoma, Stage III 12/09/2021: CT Abdomen/Pelvis showed retroperitoneal adenopathy, with 2.1 x 3.6 cm node 12/20/2021: establish care with [facility]  12/27/2021: CT abdominal mass biopsy performed, findings consistent with classical Hodgkin's lymphoma. 01/05/2022: PET CT scan showed hypermetabolic abdominal adenopathy consistent with lymphoma.  She has mild hypermetabolism involving the mediastinal and bilateral axillary nodes. 01/17/2022: Cycle 1, Day 1 of AVD chemotherapy 01/31/2022: Cycle 1, Day 15 of AVD chemotherapy 04/24/2021: Cycle 2 Day 15 of AVD chemotherapy 05/09/2022: Cycle 3, Day 1 of AVD chemotherapy 06/06/2022: Cycle 3, Day 15 of AVD chemotherapy  Interval History:  Heather Petty 72 y.o. female with medical history significant for newly diagnosed Hodgkin's lymphoma who presents for a follow up visit. The patient's last visit was on 05/09/2022. In the interim she was admitted from 05/18/2022-05/24/2022 due to febrile neutropenia. She was started on IV antibiotics and discharged. She is unaccompanied for this visit.   On exam today [name] reports her energy levels are stable with persistent fatigue. She requires frequent resting after doing her daily routines. She requires a wheelchair to ambulate which is her baseline. Her appetite is fair. She denies nausea, vomiting or abdominal pain. She does have intermittent episodes of constipation and she takes stool softeners as needed. She denies easy bruising or signs of bleeding. She reports the skin near the port is itchy but denies any pain or discharge. She does have dry, peeling skin which she is trying different moisturizers. She denies fevers, chills, sweats, chest pain or cough. She has no  other complaints.  A full 10 point ROS is otherwise negative.   MEDICAL HISTORY:  Past Medical History:  Diagnosis Date   AKI (acute kidney injury) (HCC) 12/17/2017   Allergic rhinitis    Anemia    Arthritis    Asthma    Autonomic neuropathy    Depression    Diabetes mellitus    Diabetes mellitus without complication (HCC)    E coli infection    GERD (gastroesophageal reflux disease)    GERD (gastroesophageal reflux disease)    Gout    Hepatitis A    High cholesterol    Hypertension    Insomnia    Kidney failure    Metabolic encephalopathy    Morbid obesity (HCC)    NASH (nonalcoholic steatohepatitis)    Pneumonia 12/17/2017   Schizophrenia (HCC)    Stroke (HCC)    Vitamin B deficiency    Vitamin D deficiency     SURGICAL HISTORY: Past Surgical History:  Procedure Laterality Date   CYST EXCISION     buttucks   DENTAL SURGERY     IR IMAGING GUIDED PORT INSERTION  01/10/2022   IR IMAGING GUIDED PORT INSERTION  04/21/2022   IR REMOVAL TUN ACCESS W/ PORT W/O FL MOD SED  02/28/2022   TONSILLECTOMY      SOCIAL HISTORY: Social History   Socioeconomic History   Marital status: Unknown    Spouse name: Not on file   Number of children: 2   Years of education: Not on file   Highest education level: Not on file  Occupational History   Occupation: retired/disabled  Tobacco Use   Smoking status: Never   Smokeless tobacco: Never  Vaping Use   Vaping Use: Never used  Substance and Sexual  Activity   Alcohol use: Never   Drug use: Never   Sexual activity: Not Currently    Birth control/protection: None  Other Topics Concern   Not on file  Social History Narrative   ** Merged History Encounter **       Social Determinants of Health   Financial Resource Strain: Not on file  Food Insecurity: No Food Insecurity (05/20/2022)   Hunger Vital Sign    Worried About Running Out of Food in the Last Year: Never true    Ran Out of Food in the Last Year: Never true   Transportation Needs: No Transportation Needs (05/20/2022)   PRAPARE - Administrator, Civil Service (Medical): No    Lack of Transportation (Non-Medical): No  Physical Activity: Not on file  Stress: Not on file  Social Connections: Not on file  Intimate Partner Violence: Not At Risk (05/20/2022)   Humiliation, Afraid, Rape, and Kick questionnaire    Fear of Current or Ex-Partner: No    Emotionally Abused: No    Physically Abused: No    Sexually Abused: No    FAMILY HISTORY: Family History  Adopted: Yes  Problem Relation Age of Onset   COPD Daughter     ALLERGIES:  is allergic to vancomycin, emend [aprepitant], fosaprepitant, lasix [furosemide], lasix [furosemide], and pineapple.  MEDICATIONS:  Current Outpatient Medications  Medication Sig Dispense Refill   acetaminophen (TYLENOL) 500 MG tablet Take 1,000 mg by mouth every 6 (six) hours as needed (for pain- CANNOT EXCEED A TOTAL OF 3,000 MG/DAY FROM ALL COMBINED SOURCES).     albuterol (VENTOLIN HFA) 108 (90 Base) MCG/ACT inhaler Inhale 2 puffs into the lungs every 4 (four) hours as needed for wheezing or shortness of breath.     allopurinol (ZYLOPRIM) 100 MG tablet Take 100 mg by mouth daily.     Amino Acids-Protein Hydrolys (PRO-STAT) LIQD Take 30 mLs by mouth 2 (two) times daily between meals.     amLODipine (NORVASC) 10 MG tablet Take 10 mg by mouth daily.     ASPERCREME LIDOCAINE 4 % Place 1 patch onto the skin See admin instructions. Apply 1 patch to both knees once a day- remove as directed     calamine lotion Apply 1 Application topically 2 (two) times daily.  0   carvedilol (COREG) 6.25 MG tablet Take 6.25 mg by mouth 2 (two) times daily with a meal.     cholecalciferol (VITAMIN D3) 25 MCG (1000 UNIT) tablet Take 2,000 Units by mouth daily.     doxepin (SINEQUAN) 25 MG capsule Take 25 mg by mouth at bedtime.     Emollient (EUCERIN BABY EX) Apply 1 application  topically See admin instructions. Apply to  affected areas daily where the skin is peeling     famotidine (PEPCID) 20 MG tablet Take 20 mg by mouth every 12 (twelve) hours.     fluticasone (FLONASE) 50 MCG/ACT nasal spray Place 1 spray into both nostrils daily.     glipiZIDE (GLUCOTROL XL) 10 MG 24 hr tablet Take 10 mg by mouth in the morning and at bedtime.     hydrOXYzine (ATARAX/VISTARIL) 25 MG tablet Take 25 mg by mouth in the morning and at bedtime.     insulin aspart (NOVOLOG) 100 UNIT/ML injection Inject 0-12 Units into the skin See admin instructions. Inject 0-12 units into the skin 4 times a day (before meals and at bedtime) per sliding scale: BGL <60 or >400  = CALL MD; 60-200 =  0 units; 201-250 = 2 units; 251-300 = 4 units; 301-350 = 6 units; 351-400 = 8 units; 401-450 = 10 units; >450 = 12 units     ipratropium-albuterol (DUONEB) 0.5-2.5 (3) MG/3ML SOLN Take 3 mLs by nebulization 3 (three) times daily.     LANTUS SOLOSTAR 100 UNIT/ML Solostar Pen Inject 28-42 Units into the skin See admin instructions. Inject 42 units into the skin in the morning and 28 units at bedtime     lidocaine-prilocaine (EMLA) cream Apply 1 Application topically as needed. (Patient taking differently: Apply 1 Application topically See admin instructions. Apply to port site 1-2 hours prior to chemo- cover with saran wrap and secure with tape) 30 g 0   lisinopril (ZESTRIL) 5 MG tablet Take 5 mg by mouth daily.     loperamide (IMODIUM) 2 MG capsule Take 1 capsule (2 mg total) by mouth as needed for diarrhea or loose stools. 30 capsule 0   loratadine (CLARITIN) 10 MG tablet Take 10 mg by mouth daily.     Nutritional Supplements (BOOST GLUCOSE CONTROL) LIQD Take 237 mLs by mouth 2 (two) times daily between meals.     nystatin (MYCOSTATIN/NYSTOP) powder Apply topically 2 (two) times daily. 15 g 0   polyethylene glycol powder (GLYCOLAX/MIRALAX) 17 GM/SCOOP powder Take 17 g by mouth daily as needed for mild constipation (to be mixed into 4-8 ounces of a beverage).      prochlorperazine (COMPAZINE) 10 MG tablet Take 1 tablet (10 mg total) by mouth every 6 (six) hours as needed for nausea or vomiting. 30 tablet 0   Simethicone (GAS-X ULTRA STRENGTH) 180 MG CAPS Take 1 capsule by mouth every 8 (eight) hours as needed (gas).     SYSTANE BALANCE 0.6 % SOLN Place 1 drop into both eyes every 12 (twelve) hours.     No current facility-administered medications for this visit.   Facility-Administered Medications Ordered in Other Visits  Medication Dose Route Frequency Provider Last Rate Last Admin   0.9 %  sodium chloride infusion   Intravenous Continuous Allred, Darrell K, PA-C        REVIEW OF SYSTEMS:   Constitutional: ( - ) fevers, ( - )  chills , ( - ) night sweats Eyes: ( - ) blurriness of vision, ( - ) double vision, ( - ) watery eyes Ears, nose, mouth, throat, and face: ( - ) mucositis, ( - ) sore throat Respiratory: ( - ) cough, ( + ) dyspnea, ( - ) wheezes Cardiovascular: ( - ) palpitation, ( - ) chest discomfort, ( - ) lower extremity swelling Gastrointestinal:  ( +) nausea, ( - ) heartburn, ( - ) change in bowel habits Skin: ( - ) abnormal skin rashes Lymphatics: ( - ) new lymphadenopathy, ( - ) easy bruising Neurological: ( - ) numbness, ( - ) tingling, ( - ) new weaknesses Behavioral/Psych: ( - ) mood change, ( - ) new changes  All other systems were reviewed with the patient and are negative.  PHYSICAL EXAMINATION: ECOG PERFORMANCE STATUS: 2 - Symptomatic, <50% confined to bed  Vitals:   06/06/22 1154  BP: 136/66  Pulse: 71  Resp: 18  Temp: 98 F (36.7 C)  SpO2: 100%   Filed Weights   06/06/22 1154  Weight: 203 lb 3.2 oz (92.2 kg)    GENERAL: Well-appearing elderly female, alert, no distress and comfortable SKIN: skin color, texture, turgor are normal, no rashes or significant lesions. Erythema over port site, no discharge or  tenderness to palpation.  EYES: conjunctiva are pink and non-injected, sclera clear LUNGS: clear to  auscultation and percussion with normal breathing effort HEART: regular rate & rhythm and no murmurs and no lower extremity edema Musculoskeletal: no cyanosis of digits and no clubbing  PSYCH: alert & oriented x 3, fluent speech NEURO: no focal motor/sensory deficits  LABORATORY DATA:  I have reviewed the data as listed    Latest Ref Rng & Units 06/06/2022   11:15 AM 05/24/2022    5:28 AM 05/23/2022    5:47 AM  CBC  WBC 4.0 - 10.5 K/uL 8.6  11.2  9.6   Hemoglobin 12.0 - 15.0 g/dL 16.1  7.3  7.1   Hematocrit 36.0 - 46.0 % 31.6  22.7  22.6   Platelets 150 - 400 K/uL 220  154  131        Latest Ref Rng & Units 06/06/2022   11:15 AM 05/24/2022    5:28 AM 05/23/2022    5:47 AM  CMP  Glucose 70 - 99 mg/dL 096  045  409   BUN 8 - 23 mg/dL 11  9  11    Creatinine 0.44 - 1.00 mg/dL 8.11  9.14  7.82   Sodium 135 - 145 mmol/L 139  136  135   Potassium 3.5 - 5.1 mmol/L 4.0  3.7  3.1   Chloride 98 - 111 mmol/L 105  105  107   CO2 22 - 32 mmol/L 28  25  23    Calcium 8.9 - 10.3 mg/dL 9.8  8.3  7.9   Total Protein 6.5 - 8.1 g/dL 6.7     Total Bilirubin 0.3 - 1.2 mg/dL 0.4     Alkaline Phos 38 - 126 U/L 146     AST 15 - 41 U/L 16     ALT 0 - 44 U/L 12       RADIOGRAPHIC STUDIES: [place] CHEST SOFT TISSUE  Result Date: 05/22/2022 CLINICAL DATA:  Redness around port site.  Assess for abscess EXAM: ULTRASOUND OF CHEST SOFT TISSUES TECHNIQUE: Ultrasound examination of the chest wall soft tissues was performed in the area of clinical concern. COMPARISON:  01/05/2022 FINDINGS: Targeted ultrasound was performed of the soft tissues of the right chest wall at site of patient's clinical concern. No soft tissue edema or fluid collections were identified within the underlying tissues. No mass lesion. Patient's port catheter is noted. IMPRESSION: No sonographic abnormality within the soft tissues of the right chest wall at site of patient's clinical concern. Electronically Signed   By: Duanne Guess D.O.   On:  05/22/2022 18:21   DG Chest Port 1 View  Result Date: 05/18/2022 CLINICAL DATA:  Fever. EXAM: PORTABLE CHEST 1 VIEW COMPARISON:  March 03, 2022. FINDINGS: Stable cardiomediastinal silhouette. Right internal jugular Port-A-Cath is noted with distal tip in expected position of the SVC. Minimal right midlung subsegmental atelectasis or scarring is noted. Left lung is clear. Bony thorax is unremarkable. IMPRESSION: Stable minimal right midlung subsegmental atelectasis or scarring. Aortic Atherosclerosis (ICD10-I70.0). Electronically Signed   By: Lupita Raider M.D.   On: 05/18/2022 12:01    ASSESSMENT & PLAN Heather Petty 72 y.o. female with medical history significant for newly diagnosed Hodgkin's lymphoma who presents for a follow up visit.   #Hodgkin's Lymphoma, Stage III --biopsy of abdominal lymph node on 12/27/2021 showed classical Hodgkin's lymphoma.  --Pre-treatment PET scan from [facility] abdominal adenopathy along with mild hypermetabolism involving mediastinal and bilateral axillary nodes. PLAN: --  today patient is due for Cycle 3, Day 1 of AVD chemotherapy.  --Labs from today show white blood cell 8.6, Hgb 10.0, MCV 100.6, Plt 220. Creatinine and LFTs are normal. --AVD chemotherapy typically given without GCSF support but given her neutropenic fever she will require it moving forward.  --Proceed with treatment today without any dose modifications.  --RTC for Cycle 4 Day 1 of AVD chemotherapy. Will will obtain mid treatment PET scan before next visit.   #Port issues: --Removed original port in January 2024 and replaced it in March 2024. --Patient was recently admitted from 05/18/2022-05/24/2022 due to febrile neutropenia. Due to erythema at port site, concern was port infection. IR was consulted and advised to treat conservatively now and continue to use the access.  --Advised to try OTC benadryl or hydrocortisone cream for the pruritus.  #Sigmoid colonic focal  hypermetabolism #Hepatic steatosis with possible cirrhosis --Will request follow up with GI to further evaluate.  #Supportive Care -- chemotherapy education complete -- port placement complete --baseline echo from 01/06/2022 showed EF 60-65% -- not planning for bleomycin, no need for PFTs.  --  compazine 10mg  PO q6H for nausea (zofran contraindicated due to QT prolongated medications).  -- allopurinol 300mg  PO daily for TLS prophylaxis -- EMLA cream for port -- no pain medication required at this time.    No orders of the defined types were placed in this encounter.   All questions were answered. The patient knows to call the clinic with any problems, questions or concerns.  I have spent a total of 30 minutes minutes of face-to-face and non-face-to-face time, preparing to see the patient, performing a medically appropriate examination, counseling and educating the patient, ordering tests/procedures, communicating with other health care professionals, documenting clinical information in the electronic health record,  and care coordination.   Georga Kaufmann PA-C Dept of Hematology and Oncology [facility] Phone: [phone]     06/07/2022 6:09 AM

## 2022-06-08 ENCOUNTER — Ambulatory Visit: Payer: Medicare Other

## 2022-06-08 ENCOUNTER — Other Ambulatory Visit: Payer: Self-pay

## 2022-06-14 ENCOUNTER — Ambulatory Visit
Admission: RE | Admit: 2022-06-14 | Discharge: 2022-06-14 | Disposition: A | Discharge status: Home / Self Care | Payer: Medicare Other | Source: Ambulatory Visit | Attending: Physician Assistant | Admitting: Physician Assistant

## 2022-06-14 DIAGNOSIS — R9389 Abnormal findings on diagnostic imaging of other specified body structures: Secondary | ICD-10-CM | POA: Insufficient documentation

## 2022-06-14 DIAGNOSIS — C8198 Hodgkin lymphoma, unspecified, lymph nodes of multiple sites: Secondary | ICD-10-CM | POA: Diagnosis not present

## 2022-06-14 DIAGNOSIS — R590 Localized enlarged lymph nodes: Secondary | ICD-10-CM | POA: Insufficient documentation

## 2022-06-14 LAB — GLUCOSE, CAPILLARY: Glucose-Capillary: 69 mg/dL — ABNORMAL LOW (ref 70–99)

## 2022-06-14 MED ORDER — FLUDEOXYGLUCOSE F - 18 (FDG) INJECTION
9.4300 | Freq: Once | INTRAVENOUS | Status: AC | PRN
  Administered 2022-06-14: 9.43 via INTRAVENOUS

## 2022-06-19 ENCOUNTER — Ambulatory Visit: Payer: Medicare Other

## 2022-06-19 ENCOUNTER — Other Ambulatory Visit: Payer: Medicare Other

## 2022-06-20 ENCOUNTER — Inpatient Hospital Stay (HOSPITAL_BASED_OUTPATIENT_CLINIC_OR_DEPARTMENT_OTHER): Payer: Medicare Other | Admitting: Physician Assistant

## 2022-06-20 ENCOUNTER — Inpatient Hospital Stay: Payer: Medicare Other

## 2022-06-20 ENCOUNTER — Other Ambulatory Visit: Payer: Medicare Other

## 2022-06-20 ENCOUNTER — Telehealth: Payer: Self-pay

## 2022-06-20 ENCOUNTER — Ambulatory Visit: Payer: Medicare Other

## 2022-06-20 ENCOUNTER — Other Ambulatory Visit: Payer: Self-pay

## 2022-06-20 ENCOUNTER — Ambulatory Visit: Payer: Medicare Other | Admitting: Physician Assistant

## 2022-06-20 ENCOUNTER — Other Ambulatory Visit: Payer: Self-pay | Admitting: *Deleted

## 2022-06-20 DIAGNOSIS — Z95828 Presence of other vascular implants and grafts: Secondary | ICD-10-CM

## 2022-06-20 DIAGNOSIS — C8198 Hodgkin lymphoma, unspecified, lymph nodes of multiple sites: Secondary | ICD-10-CM | POA: Diagnosis not present

## 2022-06-20 DIAGNOSIS — Z5111 Encounter for antineoplastic chemotherapy: Secondary | ICD-10-CM | POA: Diagnosis not present

## 2022-06-20 DIAGNOSIS — C8173 Other classical Hodgkin lymphoma, intra-abdominal lymph nodes: Secondary | ICD-10-CM

## 2022-06-20 LAB — CBC WITH DIFFERENTIAL (CANCER CENTER ONLY)
Abs Immature Granulocytes: 0.07 10*3/uL (ref 0.00–0.07)
Basophils Absolute: 0.1 10*3/uL (ref 0.0–0.1)
Basophils Relative: 1 %
Eosinophils Absolute: 0.1 10*3/uL (ref 0.0–0.5)
Eosinophils Relative: 1 %
HCT: 29.8 % — ABNORMAL LOW (ref 36.0–46.0)
Hemoglobin: 9.8 g/dL — ABNORMAL LOW (ref 12.0–15.0)
Immature Granulocytes: 1 %
Lymphocytes Relative: 22 %
Lymphs Abs: 2 10*3/uL (ref 0.7–4.0)
MCH: 32.5 pg (ref 26.0–34.0)
MCHC: 32.9 g/dL (ref 30.0–36.0)
MCV: 98.7 fL (ref 80.0–100.0)
Monocytes Absolute: 0.8 10*3/uL (ref 0.1–1.0)
Monocytes Relative: 9 %
Neutro Abs: 6.1 10*3/uL (ref 1.7–7.7)
Neutrophils Relative %: 66 %
Platelet Count: 157 10*3/uL (ref 150–400)
RBC: 3.02 MIL/uL — ABNORMAL LOW (ref 3.87–5.11)
RDW: 17.9 % — ABNORMAL HIGH (ref 11.5–15.5)
WBC Count: 9.1 10*3/uL (ref 4.0–10.5)
nRBC: 0 % (ref 0.0–0.2)

## 2022-06-20 LAB — CMP (CANCER CENTER ONLY)
ALT: 14 U/L (ref 0–44)
AST: 13 U/L — ABNORMAL LOW (ref 15–41)
Albumin: 4 g/dL (ref 3.5–5.0)
Alkaline Phosphatase: 146 U/L — ABNORMAL HIGH (ref 38–126)
Anion gap: 7 (ref 5–15)
BUN: 13 mg/dL (ref 8–23)
CO2: 27 mmol/L (ref 22–32)
Calcium: 9.1 mg/dL (ref 8.9–10.3)
Chloride: 106 mmol/L (ref 98–111)
Creatinine: 0.73 mg/dL (ref 0.44–1.00)
GFR, Estimated: >60 mL/min (ref >60)
Glucose, Bld: 260 mg/dL — ABNORMAL HIGH (ref 70–99)
Potassium: 3.5 mmol/L (ref 3.5–5.1)
Sodium: 140 mmol/L (ref 135–145)
Total Bilirubin: 0.4 mg/dL (ref 0.3–1.2)
Total Protein: 6.6 g/dL (ref 6.5–8.1)

## 2022-06-20 MED ORDER — DOXYCYCLINE HYCLATE 100 MG PO TABS: 100.0000 mg | ORAL_TABLET | Freq: Two times a day (BID) | ORAL | 1 refills | Status: DC

## 2022-06-20 MED ORDER — SODIUM CHLORIDE 0.9% FLUSH
10.0000 mL | Freq: Once | INTRAVENOUS | Status: AC
  Administered 2022-06-20: 10 mL

## 2022-06-20 MED ORDER — HEPARIN SOD (PORK) LOCK FLUSH 100 UNIT/ML IV SOLN
500.0000 [IU] | Freq: Once | INTRAVENOUS | Status: AC
  Administered 2022-06-20: 500 [IU]

## 2022-06-20 MED ORDER — DOXYCYCLINE HYCLATE 100 MG PO TABS: 100.0000 mg | ORAL_TABLET | Freq: Two times a day (BID) | ORAL | 0 refills | Status: DC

## 2022-06-20 NOTE — Progress Notes (Signed)
Heather Kaufmann, PA in for assessment and states no treatment today as pt. has a new right groin infection. Right groin dressing applied with 2 four by four gauze pads. Right chest port a cath site with pink/irritated skin right side/lateral to insertion site. Pt. instructed by Heather Kaufmann, PA to call for any signs of infection. Pt. discharged and left via wheelchair, no respiratory distress noted.

## 2022-06-20 NOTE — Telephone Encounter (Signed)
T/C made to [facility] to see if they have a wound care nurse on sight.  Per Rosey Bath, Ms [facility] nurse, they have a nurse on site and she will have her wound on her lower rt abdomen evaluated and treated.

## 2022-06-20 NOTE — Progress Notes (Signed)
[facility] [phone]   [phone]  PROGRESS NOTE  Patient Care Team: System, Provider Not In as PCP - General Fleet Contras, MD (Internal Medicine)  Hematological/Oncological History # Hodkgin's Lymphoma, Stage III 12/09/2021: CT Abdomen/Pelvis showed retroperitoneal adenopathy, with 2.1 x 3.6 cm node 12/20/2021: establish care with [facility]  12/27/2021: CT abdominal mass biopsy performed, findings consistent with classical Hodgkin's lymphoma. 01/05/2022: PET CT scan showed hypermetabolic abdominal adenopathy consistent with lymphoma.  She has mild hypermetabolism involving the mediastinal and bilateral axillary nodes. 01/17/2022: Cycle 1, Day 1 of AVD chemotherapy 01/31/2022: Cycle 1, Day 15 of AVD chemotherapy 04/24/2021: Cycle 2 Day 15 of AVD chemotherapy 05/09/2022: Cycle 3, Day 1 of AVD chemotherapy 06/06/2022: Cycle 3, Day 15 of AVD chemotherapy 06/14/2022: Mid-treatment PET/CT scan: Improved thoracoabdominal lymphadenopathy (Deauville criteria 2-3).  06/20/2022: Cycle 4, Day 1 of AVD chemotherapy HELD due to right inguinal abscess.   Interval History:  Heather Petty 72 y.o. female with medical history significant for newly diagnosed Hodgkin's lymphoma who presents for a follow up visit. The patient's last visit was on 06/06/2022. In the interim she completed Cycle 3, Day 15 of AVD chemotherapy.   On exam today Heather Petty reports two palpable lumps in her right groin the past week that was enlarging and painful. She noticed that one of the lump started to drain. She denies any fevers or signs of systemic infection.   Her appetite and energy are stable. She denies nausea, vomiting or abdominal pain. Bowel habits are unchanged. She denies easy bruising or signs of bleeding. She reports the skin near the port is itchy and red with some pressure after port access today. She denies fevers, chills, sweats, chest pain or cough. She has no other  complaints.  A full 10 point ROS is otherwise negative.   MEDICAL HISTORY:  Past Medical History:  Diagnosis Date   AKI (acute kidney injury) (HCC) 12/17/2017   Allergic rhinitis    Anemia    Arthritis    Asthma    Autonomic neuropathy    Depression    Diabetes mellitus    Diabetes mellitus without complication (HCC)    E coli infection    GERD (gastroesophageal reflux disease)    GERD (gastroesophageal reflux disease)    Gout    Hepatitis A    High cholesterol    Hypertension    Insomnia    Kidney failure    Metabolic encephalopathy    Morbid obesity (HCC)    NASH (nonalcoholic steatohepatitis)    Pneumonia 12/17/2017   Schizophrenia (HCC)    Stroke (HCC)    Vitamin B deficiency    Vitamin D deficiency     SURGICAL HISTORY: Past Surgical History:  Procedure Laterality Date   CYST EXCISION     buttucks   DENTAL SURGERY     IR IMAGING GUIDED PORT INSERTION  01/10/2022   IR IMAGING GUIDED PORT INSERTION  04/21/2022   IR REMOVAL TUN ACCESS W/ PORT W/O FL MOD SED  02/28/2022   TONSILLECTOMY      SOCIAL HISTORY: Social History   Socioeconomic History   Marital status: Unknown    Spouse name: Not on file   Number of children: 2   Years of education: Not on file   Highest education level: Not on file  Occupational History   Occupation: retired/disabled  Tobacco Use   Smoking status: Never   Smokeless tobacco: Never  Vaping Use   Vaping Use: Never used  Substance and Sexual Activity   Alcohol use: Never   Drug use: Never   Sexual activity: Not Currently    Birth control/protection: None  Other Topics Concern   Not on file  Social History Narrative   ** Merged History Encounter **       Social Determinants of Health   Financial Resource Strain: Not on file  Food Insecurity: No Food Insecurity (05/20/2022)   Hunger Vital Sign    Worried About Running Out of Food in the Last Year: Never true    Ran Out of Food in the Last Year: Never true   Transportation Needs: No Transportation Needs (05/20/2022)   PRAPARE - Administrator, Civil Service (Medical): No    Lack of Transportation (Non-Medical): No  Physical Activity: Not on file  Stress: Not on file  Social Connections: Not on file  Intimate Partner Violence: Not At Risk (05/20/2022)   Humiliation, Afraid, Rape, and Kick questionnaire    Fear of Current or Ex-Partner: No    Emotionally Abused: No    Physically Abused: No    Sexually Abused: No    FAMILY HISTORY: Family History  Adopted: Yes  Problem Relation Age of Onset   COPD Daughter     ALLERGIES:  is allergic to vancomycin, emend [aprepitant], fosaprepitant, lasix [furosemide], lasix [furosemide], and pineapple.  MEDICATIONS:  Current Outpatient Medications  Medication Sig Dispense Refill   acetaminophen (TYLENOL) 500 MG tablet Take 1,000 mg by mouth every 6 (six) hours as needed (for pain- CANNOT EXCEED A TOTAL OF 3,000 MG/DAY FROM ALL COMBINED SOURCES).     albuterol (VENTOLIN HFA) 108 (90 Base) MCG/ACT inhaler Inhale 2 puffs into the lungs every 4 (four) hours as needed for wheezing or shortness of breath.     allopurinol (ZYLOPRIM) 100 MG tablet Take 100 mg by mouth daily.     Amino Acids-Protein Hydrolys (PRO-STAT) LIQD Take 30 mLs by mouth 2 (two) times daily between meals.     amLODipine (NORVASC) 10 MG tablet Take 10 mg by mouth daily.     ASPERCREME LIDOCAINE 4 % Place 1 patch onto the skin See admin instructions. Apply 1 patch to both knees once a day- remove as directed     calamine lotion Apply 1 Application topically 2 (two) times daily.  0   carvedilol (COREG) 6.25 MG tablet Take 6.25 mg by mouth 2 (two) times daily with a meal.     cholecalciferol (VITAMIN D3) 25 MCG (1000 UNIT) tablet Take 2,000 Units by mouth daily.     doxepin (SINEQUAN) 25 MG capsule Take 25 mg by mouth at bedtime.     doxycycline (VIBRA-TABS) 100 MG tablet Take 1 tablet (100 mg total) by mouth 2 (two) times daily.  14 tablet 0   Emollient (EUCERIN BABY EX) Apply 1 application  topically See admin instructions. Apply to affected areas daily where the skin is peeling     famotidine (PEPCID) 20 MG tablet Take 20 mg by mouth every 12 (twelve) hours.     fluticasone (FLONASE) 50 MCG/ACT nasal spray Place 1 spray into both nostrils daily.     glipiZIDE (GLUCOTROL XL) 10 MG 24 hr tablet Take 10 mg by mouth in the morning and at bedtime.     hydrOXYzine (ATARAX/VISTARIL) 25 MG tablet Take 25 mg by mouth in the morning and at bedtime.     insulin aspart (NOVOLOG) 100 UNIT/ML injection Inject 0-12 Units into the skin See admin instructions. Inject 0-12  units into the skin 4 times a day (before meals and at bedtime) per sliding scale: BGL <60 or >400  = CALL MD; 60-200 = 0 units; 201-250 = 2 units; 251-300 = 4 units; 301-350 = 6 units; 351-400 = 8 units; 401-450 = 10 units; >450 = 12 units     ipratropium-albuterol (DUONEB) 0.5-2.5 (3) MG/3ML SOLN Take 3 mLs by nebulization 3 (three) times daily.     LANTUS SOLOSTAR 100 UNIT/ML Solostar Pen Inject 28-42 Units into the skin See admin instructions. Inject 42 units into the skin in the morning and 28 units at bedtime     lidocaine-prilocaine (EMLA) cream Apply 1 Application topically as needed. (Patient taking differently: Apply 1 Application topically See admin instructions. Apply to port site 1-2 hours prior to chemo- cover with saran wrap and secure with tape) 30 g 0   lisinopril (ZESTRIL) 5 MG tablet Take 5 mg by mouth daily.     loperamide (IMODIUM) 2 MG capsule Take 1 capsule (2 mg total) by mouth as needed for diarrhea or loose stools. 30 capsule 0   loratadine (CLARITIN) 10 MG tablet Take 10 mg by mouth daily.     Nutritional Supplements (BOOST GLUCOSE CONTROL) LIQD Take 237 mLs by mouth 2 (two) times daily between meals.     nystatin (MYCOSTATIN/NYSTOP) powder Apply topically 2 (two) times daily. 15 g 0   polyethylene glycol powder (GLYCOLAX/MIRALAX) 17 GM/SCOOP  powder Take 17 g by mouth daily as needed for mild constipation (to be mixed into 4-8 ounces of a beverage).     prochlorperazine (COMPAZINE) 10 MG tablet Take 1 tablet (10 mg total) by mouth every 6 (six) hours as needed for nausea or vomiting. 30 tablet 0   Simethicone (GAS-X ULTRA STRENGTH) 180 MG CAPS Take 1 capsule by mouth every 8 (eight) hours as needed (gas).     SYSTANE BALANCE 0.6 % SOLN Place 1 drop into both eyes every 12 (twelve) hours.     No current facility-administered medications for this visit.   Facility-Administered Medications Ordered in Other Visits  Medication Dose Route Frequency Provider Last Rate Last Admin   0.9 %  sodium chloride infusion   Intravenous Continuous Allred, Darrell K, PA-C        REVIEW OF SYSTEMS:   Constitutional: ( - ) fevers, ( - )  chills , ( - ) night sweats Eyes: ( - ) blurriness of vision, ( - ) double vision, ( - ) watery eyes Ears, nose, mouth, throat, and face: ( - ) mucositis, ( - ) sore throat Respiratory: ( - ) cough, ( + ) dyspnea, ( - ) wheezes Cardiovascular: ( - ) palpitation, ( - ) chest discomfort, ( - ) lower extremity swelling Gastrointestinal:  ( +) nausea, ( - ) heartburn, ( - ) change in bowel habits Skin: ( - ) abnormal skin rashes Lymphatics: ( - ) new lymphadenopathy, ( - ) easy bruising Neurological: ( - ) numbness, ( - ) tingling, ( - ) new weaknesses Behavioral/Psych: ( - ) mood change, ( - ) new changes  All other systems were reviewed with the patient and are negative.  PHYSICAL EXAMINATION: ECOG PERFORMANCE STATUS: 2 - Symptomatic, <50% confined to bed  There were no vitals filed for this visit.  There were no vitals filed for this visit.   GENERAL: Well-appearing elderly female, alert, no distress and comfortable SKIN: skin color, texture, turgor are normal, no rashes or significant lesions. Erythema over port site,  no discharge or tenderness to palpation.  EYES: conjunctiva are pink and non-injected,  sclera clear LUNGS: clear to auscultation and percussion with normal breathing effort HEART: regular rate & rhythm and no murmurs and no lower extremity edema Musculoskeletal: no cyanosis of digits and no clubbing  PSYCH: alert & oriented x 3, fluent speech NEURO: no focal motor/sensory deficits INGUINAL: Palpable are of erythema and induration that is tender to palpation in the right inguinal fold. Purulent discharge noted with foul smelling odor.  LABORATORY DATA:  I have reviewed the data as listed    Latest Ref Rng & Units 06/20/2022   12:11 PM 06/06/2022   11:15 AM 05/24/2022    5:28 AM  CBC  WBC 4.0 - 10.5 K/uL 9.1  8.6  11.2   Hemoglobin 12.0 - 15.0 g/dL 9.8  60.4  7.3   Hematocrit 36.0 - 46.0 % 29.8  31.6  22.7   Platelets 150 - 400 K/uL 157  220  154        Latest Ref Rng & Units 06/20/2022   12:12 PM 06/06/2022   11:15 AM 05/24/2022    5:28 AM  CMP  Glucose 70 - 99 mg/dL 540  981  191   BUN 8 - 23 mg/dL 13  11  9    Creatinine 0.44 - 1.00 mg/dL 4.78  2.95  6.21   Sodium 135 - 145 mmol/L 140  139  136   Potassium 3.5 - 5.1 mmol/L 3.5  4.0  3.7   Chloride 98 - 111 mmol/L 106  105  105   CO2 22 - 32 mmol/L 27  28  25    Calcium 8.9 - 10.3 mg/dL 9.1  9.8  8.3   Total Protein 6.5 - 8.1 g/dL 6.6  6.7    Total Bilirubin 0.3 - 1.2 mg/dL 0.4  0.4    Alkaline Phos 38 - 126 U/L 146  146    AST 15 - 41 U/L 13  16    ALT 0 - 44 U/L 14  12      RADIOGRAPHIC STUDIES: NM PET Image Restag (PS) Skull Base To Thigh  Result Date: 06/18/2022 CLINICAL DATA:  Subsequent treatment strategy for Hodgkin lymphoma. EXAM: NUCLEAR MEDICINE PET SKULL BASE TO THIGH TECHNIQUE: 9.4 mCi F-18 FDG was injected intravenously. Full-ring PET imaging was performed from the skull base to thigh after the radiotracer. CT data was obtained and used for attenuation correction and anatomic localization. Fasting blood glucose: 69 mg/dl COMPARISON:  CT abdomen pelvis dated 02/23/2022. PET-CT dated 01/05/2022.  FINDINGS: Mediastinal blood pool activity: SUV max 2.5 Liver activity: SUV max 3.3 NECK: No hypermetabolic cervical lymphadenopathy. Incidental CT findings: None. CHEST: 7 mm short axis AP window node (series 4/image 41), max SUV 3.0, previously 3.4. Small bilateral axillary nodes, including a 4 mm short axis left axillary node (series 4/image 44) with max SUV 1.7, previously 2.5. No hypermetabolic pulmonary nodules. Right chest port terminates at the cavoatrial junction. Incidental CT findings: Atherosclerotic calcifications of the aortic arch. Moderate three-vessel coronary atherosclerosis. ABDOMEN/PELVIS: No abnormal metabolism in the liver, spleen, pancreas, or adrenal glands. Spleen is normal in size without focal lesion. Representative max SUV 3.0. 7 mm short axis portacaval node (series 4/image 80), max SUV 2.1, previously 6.4. 11 mm short axis left para-aortic node (series 4/image 91), max SUV 2.1, previously 10.5. 10 mm short axis right inguinal node (series 4/image 132), max SUV 2.0. 3.7 x 2.0 cm cutaneous/subcutaneous lesion along the right perineum (series  4/image 139), max SUV 14.9, new. Incidental CT findings: Cholelithiasis, without associated inflammatory changes. Atherosclerotic calcifications the abdominal aorta and branch vessels. Left colonic diverticulosis, without evidence of diverticulitis. SKELETON: Diffuse hypermetabolism involving the visualized axial and appendicular skeleton, suggesting marrow stimulation. No focal hypermetabolic activity to suggest skeletal metastasis. Incidental CT findings: Degenerative changes of the visualized thoracolumbar spine. IMPRESSION: New 3.7 x 2.0 cm cutaneous/subcutaneous lesion along the right perineum, worrisome for new cutaneous malignancy, possibly lymphoma. Direct inspection and sampling is suggested. Improved thoracoabdominal lymphadenopathy, as above (Deauville criteria 2-3). Spleen is normal in size without focal lesion. Diffuse hypermetabolism  involving the visualized axial and appendicular skeleton, suggesting marrow stimulation. Electronically Signed   By: Charline Bills M.D.   On: 06/18/2022 00:54   [place] CHEST SOFT TISSUE  Result Date: 05/22/2022 CLINICAL DATA:  Redness around port site.  Assess for abscess EXAM: ULTRASOUND OF CHEST SOFT TISSUES TECHNIQUE: Ultrasound examination of the chest wall soft tissues was performed in the area of clinical concern. COMPARISON:  01/05/2022 FINDINGS: Targeted ultrasound was performed of the soft tissues of the right chest wall at site of patient's clinical concern. No soft tissue edema or fluid collections were identified within the underlying tissues. No mass lesion. Patient's port catheter is noted. IMPRESSION: No sonographic abnormality within the soft tissues of the right chest wall at site of patient's clinical concern. Electronically Signed   By: Duanne Guess D.O.   On: 05/22/2022 18:21    ASSESSMENT & PLAN Heather Petty 72 y.o. female with medical history significant for newly diagnosed Hodgkin's lymphoma who presents for a follow up visit.   #Hodgkin's Lymphoma, Stage III --biopsy of abdominal lymph node on 12/27/2021 showed classical Hodgkin's lymphoma.  --Pre-treatment PET scan from Deauville 5 abdominal adenopathy along with mild hypermetabolism involving mediastinal and bilateral axillary nodes. --Due to neutropenic fever, GCSF injection was added after Cycle 2, Day 15.  PLAN: --today patient is due for Cycle 4, Day 1 of AVD chemotherapy.  --Labs from today show white blood cell 9.1, Hgb 9.8, MCV 98.7, Plt 157. Creatinine and LFTs are normal. --Reviewed mid treatment PET scan from 06/14/2022 showed improved thoracoabdominal lymphadenopathy, Deauville score 2-3. Sleen is normal in size without focal lesion. There is a new 3.7 x 2.0 cm lesion along right perineum. Physical exam finding is concerning for abscess that was draining purulent discharge.  --We will hold treatment today  after discussion with Dr. Candise Che.  --RTC in 2 weeks for reassessment prior to Cycle 4 Day 1 of AVD chemotherapy.   #Right inguinal abscess: --Seen on PET scan from 06/14/2022 that showed 3.7 x 2.0 cm lesion along right perineum  --Holding treatment today and started on doxycycline therapy x 7 days with refill x 1.  --Called patient's facility to request evaluate by wound care team, confirmed evaluation later today.   #Port issues: --Removed original port in January 2024 and replaced it in March 2024. --Patient was recently admitted from 05/18/2022-05/24/2022 due to febrile neutropenia. Due to erythema at port site, concern was port infection. IR was consulted and advised to treat conservatively now and continue to use the access.  --Advised to try OTC benadryl or hydrocortisone cream for the pruritus.  #Sigmoid colonic focal hypermetabolism #Hepatic steatosis with possible cirrhosis --Will request follow up with GI to further evaluate.  #Supportive Care -- chemotherapy education complete -- port placement complete --baseline echo from 01/06/2022 showed EF 60-65% -- not planning for bleomycin, no need for PFTs.  --  compazine 10mg  PO  q6H for nausea (zofran contraindicated due to QT prolongated medications).  -- allopurinol 300mg  PO daily for TLS prophylaxis -- EMLA cream for port -- no pain medication required at this time.    Orders Placed This Encounter  Procedures   CBC with Differential (Cancer Center Only)    Standing Status:   Future    Standing Expiration Date:   07/05/2023   CMP (Cancer Center only)    Standing Status:   Future    Standing Expiration Date:   07/05/2023    All questions were answered. The patient knows to call the clinic with any problems, questions or concerns.  I have spent a total of 30 minutes minutes of face-to-face and non-face-to-face time, preparing to see the patient, performing a medically appropriate examination, counseling and educating the patient,  ordering tests/procedures, communicating with other health care professionals, documenting clinical information in the electronic health record,  and care coordination.   Georga Kaufmann PA-C Dept of Hematology and Oncology [facility] Phone: [phone]     06/20/2022 4:44 PM

## 2022-06-20 NOTE — Telephone Encounter (Signed)
Rx for doxycycline 100 mg faxed to [facility].  Confirmation received

## 2022-06-20 NOTE — Progress Notes (Signed)
Pt. Here for port flush and labs.  Scheduled to see Karena Addison.  Noted redness and irritation around port site.  C/O pain and pressure at chest port.  Sensitive dressing applied.  Secure chatted Karena Addison and [place]  Breeding/LPN

## 2022-06-21 ENCOUNTER — Other Ambulatory Visit: Payer: Self-pay

## 2022-06-21 ENCOUNTER — Ambulatory Visit: Payer: Medicare Other

## 2022-06-22 ENCOUNTER — Inpatient Hospital Stay: Payer: Medicare Other

## 2022-06-22 ENCOUNTER — Ambulatory Visit: Payer: Medicare Other

## 2022-06-22 DIAGNOSIS — C8198 Hodgkin lymphoma, unspecified, lymph nodes of multiple sites: Secondary | ICD-10-CM

## 2022-06-22 NOTE — Progress Notes (Signed)
Per Twanna Hy RN note on 06/20/2022 patient did not receive treatment due to right groin infection. Patient will not get Udenyca injection today.

## 2022-07-05 ENCOUNTER — Inpatient Hospital Stay: Payer: Medicare Other

## 2022-07-05 ENCOUNTER — Other Ambulatory Visit: Payer: Self-pay

## 2022-07-05 ENCOUNTER — Inpatient Hospital Stay (HOSPITAL_BASED_OUTPATIENT_CLINIC_OR_DEPARTMENT_OTHER): Payer: Medicare Other | Admitting: Hematology and Oncology

## 2022-07-05 VITALS — BP 146/68 | HR 65 | Temp 98.3°F | Resp 14

## 2022-07-05 VITALS — BP 149/61 | HR 78

## 2022-07-05 DIAGNOSIS — C8173 Other classical Hodgkin lymphoma, intra-abdominal lymph nodes: Secondary | ICD-10-CM | POA: Diagnosis not present

## 2022-07-05 DIAGNOSIS — C8198 Hodgkin lymphoma, unspecified, lymph nodes of multiple sites: Secondary | ICD-10-CM

## 2022-07-05 DIAGNOSIS — Z95828 Presence of other vascular implants and grafts: Secondary | ICD-10-CM

## 2022-07-05 DIAGNOSIS — Z5111 Encounter for antineoplastic chemotherapy: Secondary | ICD-10-CM | POA: Diagnosis not present

## 2022-07-05 LAB — CBC WITH DIFFERENTIAL (CANCER CENTER ONLY)
Abs Immature Granulocytes: 0.02 10*3/uL (ref 0.00–0.07)
Basophils Absolute: 0 10*3/uL (ref 0.0–0.1)
Basophils Relative: 0 %
Eosinophils Absolute: 0.2 10*3/uL (ref 0.0–0.5)
Eosinophils Relative: 3 %
HCT: 33.8 % — ABNORMAL LOW (ref 36.0–46.0)
Hemoglobin: 10.9 g/dL — ABNORMAL LOW (ref 12.0–15.0)
Immature Granulocytes: 0 %
Lymphocytes Relative: 28 %
Lymphs Abs: 2.2 10*3/uL (ref 0.7–4.0)
MCH: 32.2 pg (ref 26.0–34.0)
MCHC: 32.2 g/dL (ref 30.0–36.0)
MCV: 99.7 fL (ref 80.0–100.0)
Monocytes Absolute: 0.6 10*3/uL (ref 0.1–1.0)
Monocytes Relative: 8 %
Neutro Abs: 4.7 10*3/uL (ref 1.7–7.7)
Neutrophils Relative %: 61 %
Platelet Count: 152 10*3/uL (ref 150–400)
RBC: 3.39 MIL/uL — ABNORMAL LOW (ref 3.87–5.11)
RDW: 15.5 % (ref 11.5–15.5)
WBC Count: 7.9 10*3/uL (ref 4.0–10.5)
nRBC: 0 % (ref 0.0–0.2)

## 2022-07-05 LAB — CMP (CANCER CENTER ONLY)
ALT: 16 U/L (ref 0–44)
AST: 17 U/L (ref 15–41)
Albumin: 4.2 g/dL (ref 3.5–5.0)
Alkaline Phosphatase: 145 U/L — ABNORMAL HIGH (ref 38–126)
Anion gap: 6 (ref 5–15)
BUN: 31 mg/dL — ABNORMAL HIGH (ref 8–23)
CO2: 25 mmol/L (ref 22–32)
Calcium: 9.6 mg/dL (ref 8.9–10.3)
Chloride: 106 mmol/L (ref 98–111)
Creatinine: 0.97 mg/dL (ref 0.44–1.00)
GFR, Estimated: >60 mL/min (ref >60)
Glucose, Bld: 162 mg/dL — ABNORMAL HIGH (ref 70–99)
Potassium: 4.4 mmol/L (ref 3.5–5.1)
Sodium: 137 mmol/L (ref 135–145)
Total Bilirubin: 0.4 mg/dL (ref 0.3–1.2)
Total Protein: 6.9 g/dL (ref 6.5–8.1)

## 2022-07-05 MED ORDER — DOXORUBICIN HCL CHEMO IV INJECTION 2 MG/ML
25.0000 mg/m2 | Freq: Once | INTRAVENOUS | Status: AC
  Administered 2022-07-05: 52 mg via INTRAVENOUS
  Filled 2022-07-05: qty 26

## 2022-07-05 MED ORDER — SODIUM CHLORIDE 0.9% FLUSH
10.0000 mL | INTRAVENOUS | Status: DC | PRN
  Administered 2022-07-05: 10 mL

## 2022-07-05 MED ORDER — SODIUM CHLORIDE 0.9 % IV SOLN
10.0000 mg | Freq: Once | INTRAVENOUS | Status: AC
  Administered 2022-07-05: 10 mg via INTRAVENOUS
  Filled 2022-07-05: qty 10

## 2022-07-05 MED ORDER — VINBLASTINE SULFATE CHEMO INJECTION 1 MG/ML
6.0000 mg/m2 | Freq: Once | INTRAVENOUS | Status: AC
  Administered 2022-07-05: 12.5 mg via INTRAVENOUS
  Filled 2022-07-05: qty 12.5

## 2022-07-05 MED ORDER — SODIUM CHLORIDE 0.9% FLUSH
10.0000 mL | Freq: Once | INTRAVENOUS | Status: AC
  Administered 2022-07-05: 10 mL

## 2022-07-05 MED ORDER — HEPARIN SOD (PORK) LOCK FLUSH 100 UNIT/ML IV SOLN
500.0000 [IU] | Freq: Once | INTRAVENOUS | Status: AC | PRN
  Administered 2022-07-05: 500 [IU]

## 2022-07-05 MED ORDER — SODIUM CHLORIDE 0.9 % IV SOLN: Freq: Once | INTRAVENOUS | Status: AC

## 2022-07-05 MED ORDER — PALONOSETRON HCL INJECTION 0.25 MG/5ML
0.2500 mg | Freq: Once | INTRAVENOUS | Status: AC
  Administered 2022-07-05: 0.25 mg via INTRAVENOUS
  Filled 2022-07-05: qty 5

## 2022-07-05 MED ORDER — SODIUM CHLORIDE 0.9 % IV SOLN
375.0000 mg/m2 | Freq: Once | INTRAVENOUS | Status: AC
  Administered 2022-07-05: 800 mg via INTRAVENOUS
  Filled 2022-07-05: qty 80

## 2022-07-05 NOTE — Patient Instructions (Signed)
[facility]  Discharge Instructions: Thank you for choosing [facility] to provide your oncology and hematology care.   If you have a lab appointment with the Cancer Center, please go directly to the Cancer Center and check in at the registration area.   Wear comfortable clothing and clothing appropriate for easy access to any Portacath or PICC line.   We strive to give you quality time with your provider. You may need to reschedule your appointment if you arrive late (15 or more minutes).  Arriving late affects you and other patients whose appointments are after yours.  Also, if you miss three or more appointments without notifying the office, you may be dismissed from the clinic at the provider's discretion.      For prescription refill requests, have your pharmacy contact our office and allow 72 hours for refills to be completed.    Today you received the following chemotherapy and/or immunotherapy agents adriamycin, vinblastine, dacarbazine      To help prevent nausea and vomiting after your treatment, we encourage you to take your nausea medication as directed.  BELOW ARE SYMPTOMS THAT SHOULD BE REPORTED IMMEDIATELY: *FEVER GREATER THAN 100.4 F (38 C) OR HIGHER *CHILLS OR SWEATING *NAUSEA AND VOMITING THAT IS NOT CONTROLLED WITH YOUR NAUSEA MEDICATION *UNUSUAL SHORTNESS OF BREATH *UNUSUAL BRUISING OR BLEEDING *URINARY PROBLEMS (pain or burning when urinating, or frequent urination) *BOWEL PROBLEMS (unusual diarrhea, constipation, pain near the anus) TENDERNESS IN MOUTH AND THROAT WITH OR WITHOUT PRESENCE OF ULCERS (sore throat, sores in mouth, or a toothache) UNUSUAL RASH, SWELLING OR PAIN  UNUSUAL VAGINAL DISCHARGE OR ITCHING   Items with * indicate a potential emergency and should be followed up as soon as possible or go to the Emergency Department if any problems should occur.  Please show the CHEMOTHERAPY ALERT CARD or  IMMUNOTHERAPY ALERT CARD at check-in to the Emergency Department and triage nurse.  Should you have questions after your visit or need to cancel or reschedule your appointment, please contact [facility]  Dept: [phone]  and follow the prompts.  Office hours are 8:00 a.m. to 4:30 p.m. Monday - Friday. Please note that voicemails left after 4:00 p.m. may not be returned until the following business day.  We are closed weekends and major holidays. You have access to a nurse at all times for urgent questions. Please call the main number to the clinic Dept: [phone] and follow the prompts.   For any non-urgent questions, you may also contact your provider using MyChart. We now offer e-Visits for anyone 30 and older to request care online for non-urgent symptoms. For details visit [email or web address].   Also download the MyChart app! Go to the app store, search "MyChart", open the app, select , and log in with your MyChart username and password.

## 2022-07-05 NOTE — Progress Notes (Signed)
[facility] Telephone:([phone]) [phone]   Fax:([phone]) [phone]  PROGRESS NOTE  Patient Care Team: System, Provider Not In as PCP - General Fleet Contras, MD (Internal Medicine)  Hematological/Oncological History # Hodkgin's Lymphoma, Stage III 12/09/2021: CT Abdomen/Pelvis showed retroperitoneal adenopathy, with 2.1 x 3.6 cm node 12/20/2021: establish care with [facility]  12/27/2021: CT abdominal mass biopsy performed, findings consistent with classical Hodgkin's lymphoma. 01/05/2022: PET CT scan showed hypermetabolic abdominal adenopathy consistent with lymphoma.  She has mild hypermetabolism involving the mediastinal and bilateral axillary nodes. 01/17/2022: Cycle 1, Day 1 of AVD chemotherapy 01/31/2022: Cycle 1, Day 15 of AVD chemotherapy 04/24/2021: Cycle 2 Day 15 of AVD chemotherapy 05/09/2022: Cycle 3, Day 1 of AVD chemotherapy 06/06/2022: Cycle 3, Day 15 of AVD chemotherapy 06/14/2022: Mid-treatment PET/CT scan: Improved thoracoabdominal lymphadenopathy (Deauville criteria 2-3).  06/20/2022: Cycle 4, Day 1 of AVD chemotherapy HELD due to right inguinal abscess.  07/05/2022: Cycle 4, Day 1 of AVD chemotherapy   Interval History:  Heather Petty 72 y.o. female with medical history significant for newly diagnosed Hodgkin's lymphoma who presents for a follow up visit. The patient's last visit was on 06/06/2022. In the interim she completed Cycle 3, Day 15 of AVD chemotherapy, but Cycle 4 Day 1 was held.   On exam today [name] reports she does have sore arms and legs and is unsure why.  She has been taking Tylenol which is not helping with this.  She reports the abscesses in her legs are improving, though packing them nightly hurts.  She is currently over 1 week on the doxycycline therapy and tolerating it well.  She notes that she does have a dry mouth and dry throat but overall her health is steady.  She is not having any major side effects as result of her  chemotherapy such as nausea, vomiting, or diarrhea.  She has been having some issues with fatigue.  Her appetite has been strong.  She notes that she is having some cracking and dryness of her fingernails but that overall her skin is in good condition.  She denies any fevers, chills, sweats, nausea, vomiting or diarrhea.  A full 10 point ROS is otherwise negative.   MEDICAL HISTORY:  Past Medical History:  Diagnosis Date   AKI (acute kidney injury) (HCC) 12/17/2017   Allergic rhinitis    Anemia    Arthritis    Asthma    Autonomic neuropathy    Depression    Diabetes mellitus    Diabetes mellitus without complication (HCC)    E coli infection    GERD (gastroesophageal reflux disease)    GERD (gastroesophageal reflux disease)    Gout    Hepatitis A    High cholesterol    Hypertension    Insomnia    Kidney failure    Metabolic encephalopathy    Morbid obesity (HCC)    NASH (nonalcoholic steatohepatitis)    Pneumonia 12/17/2017   Schizophrenia (HCC)    Stroke (HCC)    Vitamin B deficiency    Vitamin D deficiency     SURGICAL HISTORY: Past Surgical History:  Procedure Laterality Date   CYST EXCISION     buttucks   DENTAL SURGERY     IR IMAGING GUIDED PORT INSERTION  01/10/2022   IR IMAGING GUIDED PORT INSERTION  04/21/2022   IR REMOVAL TUN ACCESS W/ PORT W/O FL MOD SED  02/28/2022   TONSILLECTOMY      SOCIAL HISTORY: Social History   Socioeconomic History  Marital status: Unknown    Spouse name: Not on file   Number of children: 2   Years of education: Not on file   Highest education level: Not on file  Occupational History   Occupation: retired/disabled  Tobacco Use   Smoking status: Never   Smokeless tobacco: Never  Vaping Use   Vaping Use: Never used  Substance and Sexual Activity   Alcohol use: Never   Drug use: Never   Sexual activity: Not Currently    Birth control/protection: None  Other Topics Concern   Not on file  Social History Narrative   **  Merged History Encounter **       Social Determinants of Health   Financial Resource Strain: Not on file  Food Insecurity: No Food Insecurity (05/20/2022)   Hunger Vital Sign    Worried About Running Out of Food in the Last Year: Never true    Ran Out of Food in the Last Year: Never true  Transportation Needs: No Transportation Needs (05/20/2022)   PRAPARE - Administrator, Civil Service (Medical): No    Lack of Transportation (Non-Medical): No  Physical Activity: Not on file  Stress: Not on file  Social Connections: Not on file  Intimate Partner Violence: Not At Risk (05/20/2022)   Humiliation, Afraid, Rape, and Kick questionnaire    Fear of Current or Ex-Partner: No    Emotionally Abused: No    Physically Abused: No    Sexually Abused: No    FAMILY HISTORY: Family History  Adopted: Yes  Problem Relation Age of Onset   COPD Daughter     ALLERGIES:  is allergic to vancomycin, emend [aprepitant], fosaprepitant, lasix [furosemide], lasix [furosemide], and pineapple.  MEDICATIONS:  Current Outpatient Medications  Medication Sig Dispense Refill   acetaminophen (TYLENOL) 500 MG tablet Take 1,000 mg by mouth every 6 (six) hours as needed (for pain- CANNOT EXCEED A TOTAL OF 3,000 MG/DAY FROM ALL COMBINED SOURCES).     albuterol (VENTOLIN HFA) 108 (90 Base) MCG/ACT inhaler Inhale 2 puffs into the lungs every 4 (four) hours as needed for wheezing or shortness of breath.     allopurinol (ZYLOPRIM) 100 MG tablet Take 100 mg by mouth daily.     Amino Acids-Protein Hydrolys (PRO-STAT) LIQD Take 30 mLs by mouth 2 (two) times daily between meals.     amLODipine (NORVASC) 10 MG tablet Take 10 mg by mouth daily.     ASPERCREME LIDOCAINE 4 % Place 1 patch onto the skin See admin instructions. Apply 1 patch to both knees once a day- remove as directed     calamine lotion Apply 1 Application topically 2 (two) times daily.  0   carvedilol (COREG) 6.25 MG tablet Take 6.25 mg by mouth 2  (two) times daily with a meal.     cholecalciferol (VITAMIN D3) 25 MCG (1000 UNIT) tablet Take 2,000 Units by mouth daily.     doxepin (SINEQUAN) 25 MG capsule Take 25 mg by mouth at bedtime.     doxycycline (VIBRA-TABS) 100 MG tablet Take 1 tablet (100 mg total) by mouth 2 (two) times daily. 14 tablet 0   Emollient (EUCERIN BABY EX) Apply 1 application  topically See admin instructions. Apply to affected areas daily where the skin is peeling     famotidine (PEPCID) 20 MG tablet Take 20 mg by mouth every 12 (twelve) hours.     fluticasone (FLONASE) 50 MCG/ACT nasal spray Place 1 spray into both nostrils daily.  glipiZIDE (GLUCOTROL XL) 10 MG 24 hr tablet Take 10 mg by mouth in the morning and at bedtime.     hydrOXYzine (ATARAX/VISTARIL) 25 MG tablet Take 25 mg by mouth in the morning and at bedtime.     insulin aspart (NOVOLOG) 100 UNIT/ML injection Inject 0-12 Units into the skin See admin instructions. Inject 0-12 units into the skin 4 times a day (before meals and at bedtime) per sliding scale: BGL <60 or >400  = CALL MD; 60-200 = 0 units; 201-250 = 2 units; 251-300 = 4 units; 301-350 = 6 units; 351-400 = 8 units; 401-450 = 10 units; >450 = 12 units     ipratropium-albuterol (DUONEB) 0.5-2.5 (3) MG/3ML SOLN Take 3 mLs by nebulization 3 (three) times daily.     LANTUS SOLOSTAR 100 UNIT/ML Solostar Pen Inject 28-42 Units into the skin See admin instructions. Inject 42 units into the skin in the morning and 28 units at bedtime     lidocaine-prilocaine (EMLA) cream Apply 1 Application topically as needed. (Patient taking differently: Apply 1 Application topically See admin instructions. Apply to port site 1-2 hours prior to chemo- cover with saran wrap and secure with tape) 30 g 0   lisinopril (ZESTRIL) 5 MG tablet Take 5 mg by mouth daily.     loperamide (IMODIUM) 2 MG capsule Take 1 capsule (2 mg total) by mouth as needed for diarrhea or loose stools. 30 capsule 0   loratadine (CLARITIN) 10 MG  tablet Take 10 mg by mouth daily.     Nutritional Supplements (BOOST GLUCOSE CONTROL) LIQD Take 237 mLs by mouth 2 (two) times daily between meals.     nystatin (MYCOSTATIN/NYSTOP) powder Apply topically 2 (two) times daily. 15 g 0   polyethylene glycol powder (GLYCOLAX/MIRALAX) 17 GM/SCOOP powder Take 17 g by mouth daily as needed for mild constipation (to be mixed into 4-8 ounces of a beverage).     prochlorperazine (COMPAZINE) 10 MG tablet Take 1 tablet (10 mg total) by mouth every 6 (six) hours as needed for nausea or vomiting. 30 tablet 0   Simethicone (GAS-X ULTRA STRENGTH) 180 MG CAPS Take 1 capsule by mouth every 8 (eight) hours as needed (gas).     SYSTANE BALANCE 0.6 % SOLN Place 1 drop into both eyes every 12 (twelve) hours.     No current facility-administered medications for this visit.   Facility-Administered Medications Ordered in Other Visits  Medication Dose Route Frequency Provider Last Rate Last Admin   0.9 %  sodium chloride infusion   Intravenous Continuous Allred, Darrell K, PA-C       sodium chloride flush (NS) 0.9 % injection 10 mL  10 mL Intracatheter PRN Jaci Standard, MD   10 mL at 07/05/22 1357    REVIEW OF SYSTEMS:   Constitutional: ( - ) fevers, ( - )  chills , ( - ) night sweats Eyes: ( - ) blurriness of vision, ( - ) double vision, ( - ) watery eyes Ears, nose, mouth, throat, and face: ( - ) mucositis, ( - ) sore throat Respiratory: ( - ) cough, ( + ) dyspnea, ( - ) wheezes Cardiovascular: ( - ) palpitation, ( - ) chest discomfort, ( - ) lower extremity swelling Gastrointestinal:  ( +) nausea, ( - ) heartburn, ( - ) change in bowel habits Skin: ( - ) abnormal skin rashes Lymphatics: ( - ) new lymphadenopathy, ( - ) easy bruising Neurological: ( - ) numbness, ( - )  tingling, ( - ) new weaknesses Behavioral/Psych: ( - ) mood change, ( - ) new changes  All other systems were reviewed with the patient and are negative.  PHYSICAL EXAMINATION: ECOG  PERFORMANCE STATUS: 2 - Symptomatic, <50% confined to bed  Vitals:   07/05/22 0948  BP: (!) 146/68  Pulse: 65  Resp: 14  Temp: 98.3 F (36.8 C)  SpO2: 100%   There were no vitals filed for this visit.  GENERAL: Well-appearing elderly female, alert, no distress and comfortable SKIN: skin color, texture, turgor are normal, no rashes or significant lesions. Erythema over port site, no discharge or tenderness to palpation.  EYES: conjunctiva are pink and non-injected, sclera clear LUNGS: clear to auscultation and percussion with normal breathing effort HEART: regular rate & rhythm and no murmurs and no lower extremity edema Musculoskeletal: no cyanosis of digits and no clubbing  PSYCH: alert & oriented x 3, fluent speech NEURO: no focal motor/sensory deficits INGUINAL: Palpable are of erythema and induration that is tender to palpation in the right inguinal fold. Purulent discharge noted with foul smelling odor.  LABORATORY DATA:  I have reviewed the data as listed    Latest Ref Rng & Units 07/05/2022    8:51 AM 06/20/2022   12:11 PM 06/06/2022   11:15 AM  CBC  WBC 4.0 - 10.5 K/uL 7.9  9.1  8.6   Hemoglobin 12.0 - 15.0 g/dL 16.1  9.8  09.6   Hematocrit 36.0 - 46.0 % 33.8  29.8  31.6   Platelets 150 - 400 K/uL 152  157  220        Latest Ref Rng & Units 07/05/2022    8:51 AM 06/20/2022   12:12 PM 06/06/2022   11:15 AM  CMP  Glucose 70 - 99 mg/dL 045  409  811   BUN 8 - 23 mg/dL 31  13  11    Creatinine 0.44 - 1.00 mg/dL 9.14  7.82  9.56   Sodium 135 - 145 mmol/L 137  140  139   Potassium 3.5 - 5.1 mmol/L 4.4  3.5  4.0   Chloride 98 - 111 mmol/L 106  106  105   CO2 22 - 32 mmol/L 25  27  28    Calcium 8.9 - 10.3 mg/dL 9.6  9.1  9.8   Total Protein 6.5 - 8.1 g/dL 6.9  6.6  6.7   Total Bilirubin 0.3 - 1.2 mg/dL 0.4  0.4  0.4   Alkaline Phos 38 - 126 U/L 145  146  146   AST 15 - 41 U/L 17  13  16    ALT 0 - 44 U/L 16  14  12      RADIOGRAPHIC STUDIES: NM PET Image Restag (PS)  Skull Base To Thigh  Result Date: 06/18/2022 CLINICAL DATA:  Subsequent treatment strategy for Hodgkin lymphoma. EXAM: NUCLEAR MEDICINE PET SKULL BASE TO THIGH TECHNIQUE: 9.4 mCi F-18 FDG was injected intravenously. Full-ring PET imaging was performed from the skull base to thigh after the radiotracer. CT data was obtained and used for attenuation correction and anatomic localization. Fasting blood glucose: 69 mg/dl COMPARISON:  CT abdomen pelvis dated 02/23/2022. PET-CT dated 01/05/2022. FINDINGS: Mediastinal blood pool activity: SUV max 2.5 Liver activity: SUV max 3.3 NECK: No hypermetabolic cervical lymphadenopathy. Incidental CT findings: None. CHEST: 7 mm short axis AP window node (series 4/image 41), max SUV 3.0, previously 3.4. Small bilateral axillary nodes, including a 4 mm short axis left axillary node (series 4/image 44)  with max SUV 1.7, previously 2.5. No hypermetabolic pulmonary nodules. Right chest port terminates at the cavoatrial junction. Incidental CT findings: Atherosclerotic calcifications of the aortic arch. Moderate three-vessel coronary atherosclerosis. ABDOMEN/PELVIS: No abnormal metabolism in the liver, spleen, pancreas, or adrenal glands. Spleen is normal in size without focal lesion. Representative max SUV 3.0. 7 mm short axis portacaval node (series 4/image 80), max SUV 2.1, previously 6.4. 11 mm short axis left para-aortic node (series 4/image 91), max SUV 2.1, previously 10.5. 10 mm short axis right inguinal node (series 4/image 132), max SUV 2.0. 3.7 x 2.0 cm cutaneous/subcutaneous lesion along the right perineum (series 4/image 139), max SUV 14.9, new. Incidental CT findings: Cholelithiasis, without associated inflammatory changes. Atherosclerotic calcifications the abdominal aorta and branch vessels. Left colonic diverticulosis, without evidence of diverticulitis. SKELETON: Diffuse hypermetabolism involving the visualized axial and appendicular skeleton, suggesting marrow  stimulation. No focal hypermetabolic activity to suggest skeletal metastasis. Incidental CT findings: Degenerative changes of the visualized thoracolumbar spine. IMPRESSION: New 3.7 x 2.0 cm cutaneous/subcutaneous lesion along the right perineum, worrisome for new cutaneous malignancy, possibly lymphoma. Direct inspection and sampling is suggested. Improved thoracoabdominal lymphadenopathy, as above (Deauville criteria 2-3). Spleen is normal in size without focal lesion. Diffuse hypermetabolism involving the visualized axial and appendicular skeleton, suggesting marrow stimulation. Electronically Signed   By: Charline Bills M.D.   On: 06/18/2022 00:54    ASSESSMENT & PLAN Heather Petty 72 y.o. female with medical history significant for newly diagnosed Hodgkin's lymphoma who presents for a follow up visit.   #Hodgkin's Lymphoma, Stage III --biopsy of abdominal lymph node on 12/27/2021 showed classical Hodgkin's lymphoma.  --Pre-treatment PET scan from Deauville 5 abdominal adenopathy along with mild hypermetabolism involving mediastinal and bilateral axillary nodes. --Due to neutropenic fever, GCSF injection was added after Cycle 2, Day 15.  PLAN: --today patient is due for Cycle 4, Day 1 of AVD chemotherapy.  --Labs from today show white blood cell 7.9, hemoglobin 10.9, MCV 99.7, and platelets of 152. Creatinine and LFTs are normal. --Reviewed mid treatment PET scan from 06/14/2022 showed improved thoracoabdominal lymphadenopathy, Deauville score 2-3. Sleen is normal in size without focal lesion. There is a new 3.7 x 2.0 cm lesion along right perineum --RTC in 2 weeks for reassessment prior to Cycle 4 Day 15 of AVD chemotherapy.   #Right inguinal abscess: --Seen on PET scan from 06/14/2022 that showed 3.7 x 2.0 cm lesion along right perineum  --doxycycline therapy x 7 days with refill x 1.  -- Patient's wounds currently being packed by the wound care team at her care facility.  #Port  issues: --Removed original port in January 2024 and replaced it in March 2024. --Patient was recently admitted from 05/18/2022-05/24/2022 due to febrile neutropenia. Due to erythema at port site, concern was port infection. IR was consulted and advised to treat conservatively now and continue to use the access.  --Advised to try OTC benadryl or hydrocortisone cream for the pruritus.  #Sigmoid colonic focal hypermetabolism #Hepatic steatosis with possible cirrhosis --Will request follow up with GI to further evaluate.  #Supportive Care -- chemotherapy education complete -- port placement complete --baseline echo from 01/06/2022 showed EF 60-65% -- not planning for bleomycin, no need for PFTs.  --  compazine 10mg  PO q6H for nausea (zofran contraindicated due to QT prolongated medications).  -- allopurinol 300mg  PO daily for TLS prophylaxis -- EMLA cream for port -- no pain medication required at this time.    Orders Placed This Encounter  Procedures  CBC with Differential (Cancer Center Only)    Standing Status:   Future    Standing Expiration Date:   08/02/2023   CMP (Cancer Center only)    Standing Status:   Future    Standing Expiration Date:   08/02/2023   CBC with Differential (Cancer Center Only)    Standing Status:   Future    Standing Expiration Date:   08/16/2023   CMP (Cancer Center only)    Standing Status:   Future    Standing Expiration Date:   08/16/2023   CBC with Differential (Cancer Center Only)    Standing Status:   Future    Standing Expiration Date:   08/30/2023   CMP (Cancer Center only)    Standing Status:   Future    Standing Expiration Date:   08/30/2023   CBC with Differential (Cancer Center Only)    Standing Status:   Future    Standing Expiration Date:   09/13/2023   CMP (Cancer Center only)    Standing Status:   Future    Standing Expiration Date:   09/13/2023    All questions were answered. The patient knows to call the clinic with any problems,  questions or concerns.  I have spent a total of 30 minutes minutes of face-to-face and non-face-to-face time, preparing to see the patient, performing a medically appropriate examination, counseling and educating the patient, ordering tests/procedures, communicating with other health care professionals, documenting clinical information in the electronic health record,  and care coordination.   Ulysees Barns, MD Department of Hematology/Oncology [facility] Phone: [phone] Pager: [phone] Email: [email or web address]  07/05/2022 4:45 PM

## 2022-07-06 ENCOUNTER — Other Ambulatory Visit: Payer: Self-pay

## 2022-07-07 ENCOUNTER — Inpatient Hospital Stay: Payer: Medicare Other

## 2022-07-07 VITALS — BP 124/59 | HR 66 | Temp 98.4°F | Resp 18

## 2022-07-07 DIAGNOSIS — Z5111 Encounter for antineoplastic chemotherapy: Secondary | ICD-10-CM | POA: Diagnosis not present

## 2022-07-07 DIAGNOSIS — C8198 Hodgkin lymphoma, unspecified, lymph nodes of multiple sites: Secondary | ICD-10-CM

## 2022-07-07 MED ORDER — PEGFILGRASTIM-CBQV 6 MG/0.6ML ~~LOC~~ SOSY
6.0000 mg | PREFILLED_SYRINGE | Freq: Once | SUBCUTANEOUS | Status: AC
  Administered 2022-07-07: 6 mg via SUBCUTANEOUS
  Filled 2022-07-07: qty 0.6

## 2022-07-17 ENCOUNTER — Other Ambulatory Visit: Payer: Self-pay

## 2022-07-18 MED FILL — Dexamethasone Sodium Phosphate Inj 100 MG/10ML: INTRAMUSCULAR | Qty: 1 | Status: AC

## 2022-07-19 ENCOUNTER — Inpatient Hospital Stay (HOSPITAL_BASED_OUTPATIENT_CLINIC_OR_DEPARTMENT_OTHER): Payer: Medicare Other | Admitting: Physician Assistant

## 2022-07-19 ENCOUNTER — Inpatient Hospital Stay: Payer: Medicare Other | Attending: Physician Assistant

## 2022-07-19 ENCOUNTER — Inpatient Hospital Stay: Payer: Medicare Other

## 2022-07-19 DIAGNOSIS — Z5189 Encounter for other specified aftercare: Secondary | ICD-10-CM | POA: Diagnosis not present

## 2022-07-19 DIAGNOSIS — Z95828 Presence of other vascular implants and grafts: Secondary | ICD-10-CM

## 2022-07-19 DIAGNOSIS — C8198 Hodgkin lymphoma, unspecified, lymph nodes of multiple sites: Secondary | ICD-10-CM

## 2022-07-19 DIAGNOSIS — Z5111 Encounter for antineoplastic chemotherapy: Secondary | ICD-10-CM | POA: Diagnosis present

## 2022-07-19 DIAGNOSIS — C8173 Other classical Hodgkin lymphoma, intra-abdominal lymph nodes: Secondary | ICD-10-CM | POA: Diagnosis present

## 2022-07-19 LAB — CBC WITH DIFFERENTIAL (CANCER CENTER ONLY)
Abs Immature Granulocytes: 0.07 10*3/uL (ref 0.00–0.07)
Basophils Absolute: 0 10*3/uL (ref 0.0–0.1)
Basophils Relative: 0 %
Eosinophils Absolute: 0.1 10*3/uL (ref 0.0–0.5)
Eosinophils Relative: 1 %
HCT: 31 % — ABNORMAL LOW (ref 36.0–46.0)
Hemoglobin: 10 g/dL — ABNORMAL LOW (ref 12.0–15.0)
Immature Granulocytes: 1 %
Lymphocytes Relative: 23 %
Lymphs Abs: 2 10*3/uL (ref 0.7–4.0)
MCH: 32.1 pg (ref 26.0–34.0)
MCHC: 32.3 g/dL (ref 30.0–36.0)
MCV: 99.4 fL (ref 80.0–100.0)
Monocytes Absolute: 0.8 10*3/uL (ref 0.1–1.0)
Monocytes Relative: 9 %
Neutro Abs: 5.8 10*3/uL (ref 1.7–7.7)
Neutrophils Relative %: 66 %
Platelet Count: 118 10*3/uL — ABNORMAL LOW (ref 150–400)
RBC: 3.12 MIL/uL — ABNORMAL LOW (ref 3.87–5.11)
RDW: 15 % (ref 11.5–15.5)
WBC Count: 8.7 10*3/uL (ref 4.0–10.5)
nRBC: 0 % (ref 0.0–0.2)

## 2022-07-19 LAB — CMP (CANCER CENTER ONLY)
ALT: 14 U/L (ref 0–44)
AST: 14 U/L — ABNORMAL LOW (ref 15–41)
Albumin: 3.9 g/dL (ref 3.5–5.0)
Alkaline Phosphatase: 134 U/L — ABNORMAL HIGH (ref 38–126)
Anion gap: 7 (ref 5–15)
BUN: 16 mg/dL (ref 8–23)
CO2: 26 mmol/L (ref 22–32)
Calcium: 9.4 mg/dL (ref 8.9–10.3)
Chloride: 108 mmol/L (ref 98–111)
Creatinine: 0.72 mg/dL (ref 0.44–1.00)
GFR, Estimated: >60 mL/min (ref >60)
Glucose, Bld: 200 mg/dL — ABNORMAL HIGH (ref 70–99)
Potassium: 3.7 mmol/L (ref 3.5–5.1)
Sodium: 141 mmol/L (ref 135–145)
Total Bilirubin: 0.4 mg/dL (ref 0.3–1.2)
Total Protein: 6.7 g/dL (ref 6.5–8.1)

## 2022-07-19 MED ORDER — PALONOSETRON HCL INJECTION 0.25 MG/5ML
0.2500 mg | Freq: Once | INTRAVENOUS | Status: AC
  Administered 2022-07-19: 0.25 mg via INTRAVENOUS
  Filled 2022-07-19: qty 5

## 2022-07-19 MED ORDER — VINBLASTINE SULFATE CHEMO INJECTION 1 MG/ML
6.0000 mg/m2 | Freq: Once | INTRAVENOUS | Status: AC
  Administered 2022-07-19: 12.5 mg via INTRAVENOUS
  Filled 2022-07-19: qty 12.5

## 2022-07-19 MED ORDER — SODIUM CHLORIDE 0.9 % IV SOLN
375.0000 mg/m2 | Freq: Once | INTRAVENOUS | Status: AC
  Administered 2022-07-19: 800 mg via INTRAVENOUS
  Filled 2022-07-19: qty 80

## 2022-07-19 MED ORDER — SODIUM CHLORIDE 0.9% FLUSH
10.0000 mL | Freq: Once | INTRAVENOUS | Status: AC
  Administered 2022-07-19: 10 mL

## 2022-07-19 MED ORDER — SODIUM CHLORIDE 0.9 % IV SOLN
10.0000 mg | Freq: Once | INTRAVENOUS | Status: AC
  Administered 2022-07-19: 10 mg via INTRAVENOUS
  Filled 2022-07-19: qty 10

## 2022-07-19 MED ORDER — DOXORUBICIN HCL CHEMO IV INJECTION 2 MG/ML
25.0000 mg/m2 | Freq: Once | INTRAVENOUS | Status: AC
  Administered 2022-07-19: 52 mg via INTRAVENOUS
  Filled 2022-07-19: qty 26

## 2022-07-19 MED ORDER — SODIUM CHLORIDE 0.9 % IV SOLN: Freq: Once | INTRAVENOUS | Status: AC

## 2022-07-19 NOTE — Progress Notes (Signed)
[facility] [phone]   [phone]  PROGRESS NOTE  Patient Care Team: System, Provider Not In as PCP - General Fleet Contras, MD (Internal Medicine)  Hematological/Oncological History # Hodkgin's Lymphoma, Stage III 12/09/2021: CT Abdomen/Pelvis showed retroperitoneal adenopathy, with 2.1 x 3.6 cm node 12/20/2021: establish care with [facility]  12/27/2021: CT abdominal mass biopsy performed, findings consistent with classical Hodgkin's lymphoma. 01/05/2022: PET CT scan showed hypermetabolic abdominal adenopathy consistent with lymphoma.  She has mild hypermetabolism involving the mediastinal and bilateral axillary nodes. 01/17/2022: Cycle 1, Day 1 of AVD chemotherapy 01/31/2022: Cycle 1, Day 15 of AVD chemotherapy 04/24/2021: Cycle 2 Day 15 of AVD chemotherapy 05/09/2022: Cycle 3, Day 1 of AVD chemotherapy 06/06/2022: Cycle 3, Day 15 of AVD chemotherapy 06/14/2022: Mid-treatment PET/CT scan: Improved thoracoabdominal lymphadenopathy (Deauville criteria 2-3).  06/20/2022: Cycle 4, Day 1 of AVD chemotherapy HELD due to right inguinal abscess.  07/05/2022: Cycle 4, Day 1 of AVD chemotherapy  07/19/2022: Cycle 4, Day 15 of AVD chemotherapy   Interval History:  Heather Petty 72 y.o. female with medical history significant for newly diagnosed Hodgkin's lymphoma who presents for a follow up visit. The patient's last visit was on 07/05/2022. In the interim she completed Cycle 4, Day 1 of AVD chemotherapy.  On exam today Heather Petty reports her energy levels are mildly better. She still gets tired after treatment for a few days. She reports the wound has resolved and she no longer needs to pack the wound. She denies nausea, vomiting or abdominal pain. Her bowel habits are unchanged without recurrent episodes of diarrhea or constipation. She denies easy bruising or signs of bleeding. She denies any new rash or skin changes.  She denies any fevers, chills,  sweats, shortness of breath, chest pain or cough. She has no other complaints. Rest of the ROS is below.   MEDICAL HISTORY:  Past Medical History:  Diagnosis Date   AKI (acute kidney injury) (HCC) 12/17/2017   Allergic rhinitis    Anemia    Arthritis    Asthma    Autonomic neuropathy    Depression    Diabetes mellitus    Diabetes mellitus without complication (HCC)    E coli infection    GERD (gastroesophageal reflux disease)    GERD (gastroesophageal reflux disease)    Gout    Hepatitis A    High cholesterol    Hypertension    Insomnia    Kidney failure    Metabolic encephalopathy    Morbid obesity (HCC)    NASH (nonalcoholic steatohepatitis)    Pneumonia 12/17/2017   Schizophrenia (HCC)    Stroke (HCC)    Vitamin B deficiency    Vitamin D deficiency     SURGICAL HISTORY: Past Surgical History:  Procedure Laterality Date   CYST EXCISION     buttucks   DENTAL SURGERY     IR IMAGING GUIDED PORT INSERTION  01/10/2022   IR IMAGING GUIDED PORT INSERTION  04/21/2022   IR REMOVAL TUN ACCESS W/ PORT W/O FL MOD SED  02/28/2022   TONSILLECTOMY      SOCIAL HISTORY: Social History   Socioeconomic History   Marital status: Unknown    Spouse name: Not on file   Number of children: 2   Years of education: Not on file   Highest education level: Not on file  Occupational History   Occupation: retired/disabled  Tobacco Use   Smoking status: Never   Smokeless tobacco: Never  Vaping Use  Vaping Use: Never used  Substance and Sexual Activity   Alcohol use: Never   Drug use: Never   Sexual activity: Not Currently    Birth control/protection: None  Other Topics Concern   Not on file  Social History Narrative   ** Merged History Encounter **       Social Determinants of Health   Financial Resource Strain: Not on file  Food Insecurity: No Food Insecurity (05/20/2022)   Hunger Vital Sign    Worried About Running Out of Food in the Last Year: Never true    Ran Out of  Food in the Last Year: Never true  Transportation Needs: No Transportation Needs (05/20/2022)   PRAPARE - Administrator, Civil Service (Medical): No    Lack of Transportation (Non-Medical): No  Physical Activity: Not on file  Stress: Not on file  Social Connections: Not on file  Intimate Partner Violence: Not At Risk (05/20/2022)   Humiliation, Afraid, Rape, and Kick questionnaire    Fear of Current or Ex-Partner: No    Emotionally Abused: No    Physically Abused: No    Sexually Abused: No    FAMILY HISTORY: Family History  Adopted: Yes  Problem Relation Age of Onset   COPD Daughter     ALLERGIES:  is allergic to vancomycin, emend [aprepitant], fosaprepitant, lasix [furosemide], lasix [furosemide], and pineapple.  MEDICATIONS:  Current Outpatient Medications  Medication Sig Dispense Refill   acetaminophen (TYLENOL) 500 MG tablet Take 1,000 mg by mouth every 6 (six) hours as needed (for pain- CANNOT EXCEED A TOTAL OF 3,000 MG/DAY FROM ALL COMBINED SOURCES).     albuterol (VENTOLIN HFA) 108 (90 Base) MCG/ACT inhaler Inhale 2 puffs into the lungs every 4 (four) hours as needed for wheezing or shortness of breath.     allopurinol (ZYLOPRIM) 100 MG tablet Take 100 mg by mouth daily.     Amino Acids-Protein Hydrolys (PRO-STAT) LIQD Take 30 mLs by mouth 2 (two) times daily between meals.     amLODipine (NORVASC) 10 MG tablet Take 10 mg by mouth daily.     ASPERCREME LIDOCAINE 4 % Place 1 patch onto the skin See admin instructions. Apply 1 patch to both knees once a day- remove as directed     calamine lotion Apply 1 Application topically 2 (two) times daily.  0   carvedilol (COREG) 6.25 MG tablet Take 6.25 mg by mouth 2 (two) times daily with a meal.     cholecalciferol (VITAMIN D3) 25 MCG (1000 UNIT) tablet Take 2,000 Units by mouth daily.     doxepin (SINEQUAN) 25 MG capsule Take 25 mg by mouth at bedtime.     doxycycline (VIBRA-TABS) 100 MG tablet Take 1 tablet (100 mg  total) by mouth 2 (two) times daily. 14 tablet 0   Emollient (EUCERIN BABY EX) Apply 1 application  topically See admin instructions. Apply to affected areas daily where the skin is peeling     famotidine (PEPCID) 20 MG tablet Take 20 mg by mouth every 12 (twelve) hours.     fluticasone (FLONASE) 50 MCG/ACT nasal spray Place 1 spray into both nostrils daily.     glipiZIDE (GLUCOTROL XL) 10 MG 24 hr tablet Take 10 mg by mouth in the morning and at bedtime.     hydrOXYzine (ATARAX/VISTARIL) 25 MG tablet Take 25 mg by mouth in the morning and at bedtime.     insulin aspart (NOVOLOG) 100 UNIT/ML injection Inject 0-12 Units into the skin  See admin instructions. Inject 0-12 units into the skin 4 times a day (before meals and at bedtime) per sliding scale: BGL <60 or >400  = CALL MD; 60-200 = 0 units; 201-250 = 2 units; 251-300 = 4 units; 301-350 = 6 units; 351-400 = 8 units; 401-450 = 10 units; >450 = 12 units     ipratropium-albuterol (DUONEB) 0.5-2.5 (3) MG/3ML SOLN Take 3 mLs by nebulization 3 (three) times daily.     LANTUS SOLOSTAR 100 UNIT/ML Solostar Pen Inject 28-42 Units into the skin See admin instructions. Inject 42 units into the skin in the morning and 28 units at bedtime     lidocaine-prilocaine (EMLA) cream Apply 1 Application topically as needed. (Patient taking differently: Apply 1 Application topically See admin instructions. Apply to port site 1-2 hours prior to chemo- cover with saran wrap and secure with tape) 30 g 0   lisinopril (ZESTRIL) 5 MG tablet Take 5 mg by mouth daily.     loperamide (IMODIUM) 2 MG capsule Take 1 capsule (2 mg total) by mouth as needed for diarrhea or loose stools. 30 capsule 0   loratadine (CLARITIN) 10 MG tablet Take 10 mg by mouth daily.     Nutritional Supplements (BOOST GLUCOSE CONTROL) LIQD Take 237 mLs by mouth 2 (two) times daily between meals.     nystatin (MYCOSTATIN/NYSTOP) powder Apply topically 2 (two) times daily. 15 g 0   polyethylene glycol  powder (GLYCOLAX/MIRALAX) 17 GM/SCOOP powder Take 17 g by mouth daily as needed for mild constipation (to be mixed into 4-8 ounces of a beverage).     prochlorperazine (COMPAZINE) 10 MG tablet Take 1 tablet (10 mg total) by mouth every 6 (six) hours as needed for nausea or vomiting. 30 tablet 0   Simethicone (GAS-X ULTRA STRENGTH) 180 MG CAPS Take 1 capsule by mouth every 8 (eight) hours as needed (gas).     SYSTANE BALANCE 0.6 % SOLN Place 1 drop into both eyes every 12 (twelve) hours.     No current facility-administered medications for this visit.   Facility-Administered Medications Ordered in Other Visits  Medication Dose Route Frequency Provider Last Rate Last Admin   0.9 %  sodium chloride infusion   Intravenous Continuous Allred, Darrell K, PA-C        REVIEW OF SYSTEMS:   Constitutional: ( - ) fevers, ( - )  chills , ( - ) night sweats Eyes: ( - ) blurriness of vision, ( - ) double vision, ( - ) watery eyes Ears, nose, mouth, throat, and face: ( - ) mucositis, ( - ) sore throat Respiratory: ( - ) cough, ( - ) dyspnea, ( - ) wheezes Cardiovascular: ( - ) palpitation, ( - ) chest discomfort, ( - ) lower extremity swelling Gastrointestinal:  ( -) nausea, ( - ) heartburn, ( - ) change in bowel habits Skin: ( - ) abnormal skin rashes Lymphatics: ( - ) new lymphadenopathy, ( - ) easy bruising Neurological: ( - ) numbness, ( - ) tingling, ( - ) new weaknesses Behavioral/Psych: ( - ) mood change, ( - ) new changes  All other systems were reviewed with the patient and are negative.  PHYSICAL EXAMINATION: ECOG PERFORMANCE STATUS: 2 - Symptomatic, <50% confined to bed  Vitals:   07/19/22 1202  BP: (!) 137/50  Pulse: 72  Resp: 16  Temp: 97.7 F (36.5 C)  SpO2: 100%   Filed Weights   07/19/22 1202  Weight: 201 lb (91.2 kg)  GENERAL: Well-appearing elderly female, alert, no distress and comfortable SKIN: skin color, texture, turgor are normal, no rashes or significant lesions.  Mild erythema over port site, no discharge or tenderness to palpation.  EYES: conjunctiva are pink and non-injected, sclera clear LUNGS: clear to auscultation and percussion with normal breathing effort HEART: regular rate & rhythm and no murmurs and no lower extremity edema Musculoskeletal: no cyanosis of digits and no clubbing  PSYCH: alert & oriented x 3, fluent speech NEURO: no focal motor/sensory deficits   LABORATORY DATA:  I have reviewed the data as listed    Latest Ref Rng & Units 07/19/2022   11:21 AM 07/05/2022    8:51 AM 06/20/2022   12:11 PM  CBC  WBC 4.0 - 10.5 K/uL 8.7  7.9  9.1   Hemoglobin 12.0 - 15.0 g/dL 53.6  64.4  9.8   Hematocrit 36.0 - 46.0 % 31.0  33.8  29.8   Platelets 150 - 400 K/uL 118  152  157        Latest Ref Rng & Units 07/05/2022    8:51 AM 06/20/2022   12:12 PM 06/06/2022   11:15 AM  CMP  Glucose 70 - 99 mg/dL 034  742  595   BUN 8 - 23 mg/dL 31  13  11    Creatinine 0.44 - 1.00 mg/dL 6.38  7.56  4.33   Sodium 135 - 145 mmol/L 137  140  139   Potassium 3.5 - 5.1 mmol/L 4.4  3.5  4.0   Chloride 98 - 111 mmol/L 106  106  105   CO2 22 - 32 mmol/L 25  27  28    Calcium 8.9 - 10.3 mg/dL 9.6  9.1  9.8   Total Protein 6.5 - 8.1 g/dL 6.9  6.6  6.7   Total Bilirubin 0.3 - 1.2 mg/dL 0.4  0.4  0.4   Alkaline Phos 38 - 126 U/L 145  146  146   AST 15 - 41 U/L 17  13  16    ALT 0 - 44 U/L 16  14  12      RADIOGRAPHIC STUDIES: No results found.  ASSESSMENT & PLAN Female Masis 72 y.o. female with medical history significant for newly diagnosed Hodgkin's lymphoma who presents for a follow up visit.   #Hodgkin's Lymphoma, Stage III --biopsy of abdominal lymph node on 12/27/2021 showed classical Hodgkin's lymphoma.  --Pre-treatment PET scan from [facility] 5 abdominal adenopathy along with mild hypermetabolism involving mediastinal and bilateral axillary nodes. --Due to neutropenic fever, GCSF injection was added after Cycle 2, Day 15.  --Mid treatment  PET scan from 06/14/2022 showed improved thoracoabdominal lymphadenopathy, Deauville score 2-3. Sleen is normal in size without focal lesion. PLAN: --today patient is due for Cycle 4, Day 15 of AVD chemotherapy.  --Labs from today show white blood cell 8.7, hemoglobin 10.0, MCV 99.4, and platelets of 118. Creatinine and LFTs are normal. --RTC in 2 weeks for reassessment prior to Cycle 5 Day 1 of AVD chemotherapy.   #Right inguinal abscess--resolved: --Seen on PET scan from 06/14/2022 that showed 3.7 x 2.0 cm lesion along right perineum  --doxycycline therapy x 7 days with refill x 1.  -- Patient was evaluated by wound care team at her facility yesterday who confirm wound has healed and does not require any further packing.   #Port issues: --Removed original port in January 2024 and replaced it in March 2024. --Patient was recently admitted from 05/18/2022-05/24/2022 due to febrile neutropenia. Due to  erythema at port site, concern was port infection. IR was consulted and advised to treat conservatively now and continue to use the access.  --Advised to try OTC benadryl or hydrocortisone cream for the pruritus.  #Sigmoid colonic focal hypermetabolism #Hepatic steatosis with possible cirrhosis --Will request follow up with GI to further evaluate.  #Supportive Care -- chemotherapy education complete -- port placement complete --baseline echo from 01/06/2022 showed EF 60-65% -- not planning for bleomycin, no need for PFTs.  --  compazine 10mg  PO q6H for nausea (zofran contraindicated due to QT prolongated medications).  -- allopurinol 300mg  PO daily for TLS prophylaxis -- EMLA cream for port -- no pain medication required at this time.    No orders of the defined types were placed in this encounter.   All questions were answered. The patient knows to call the clinic with any problems, questions or concerns.  I have spent a total of 30 minutes minutes of face-to-face and non-face-to-face  time, preparing to see the patient, performing a medically appropriate examination, counseling and educating the patient, ordering tests/procedures, communicating with other health care professionals, documenting clinical information in the electronic health record,  and care coordination.   Georga Kaufmann PA-C Dept of Hematology and Oncology [facility] Phone: [phone]   07/19/2022 12:05 PM

## 2022-07-19 NOTE — Patient Instructions (Signed)
[facility]  Discharge Instructions: Thank you for choosing [facility] to provide your oncology and hematology care.   If you have a lab appointment with the Cancer Center, please go directly to the Cancer Center and check in at the registration area.   Wear comfortable clothing and clothing appropriate for easy access to any Portacath or PICC line.   We strive to give you quality time with your provider. You may need to reschedule your appointment if you arrive late (15 or more minutes).  Arriving late affects you and other patients whose appointments are after yours.  Also, if you miss three or more appointments without notifying the office, you may be dismissed from the clinic at the provider's discretion.      For prescription refill requests, have your pharmacy contact our office and allow 72 hours for refills to be completed.    Today you received the following chemotherapy and/or immunotherapy agents adriamycin, vinblastine, dacarbazine      To help prevent nausea and vomiting after your treatment, we encourage you to take your nausea medication as directed.  BELOW ARE SYMPTOMS THAT SHOULD BE REPORTED IMMEDIATELY: *FEVER GREATER THAN 100.4 F (38 C) OR HIGHER *CHILLS OR SWEATING *NAUSEA AND VOMITING THAT IS NOT CONTROLLED WITH YOUR NAUSEA MEDICATION *UNUSUAL SHORTNESS OF BREATH *UNUSUAL BRUISING OR BLEEDING *URINARY PROBLEMS (pain or burning when urinating, or frequent urination) *BOWEL PROBLEMS (unusual diarrhea, constipation, pain near the anus) TENDERNESS IN MOUTH AND THROAT WITH OR WITHOUT PRESENCE OF ULCERS (sore throat, sores in mouth, or a toothache) UNUSUAL RASH, SWELLING OR PAIN  UNUSUAL VAGINAL DISCHARGE OR ITCHING   Items with * indicate a potential emergency and should be followed up as soon as possible or go to the Emergency Department if any problems should occur.  Please show the CHEMOTHERAPY ALERT CARD or  IMMUNOTHERAPY ALERT CARD at check-in to the Emergency Department and triage nurse.  Should you have questions after your visit or need to cancel or reschedule your appointment, please contact [facility]  Dept: [phone]  and follow the prompts.  Office hours are 8:00 a.m. to 4:30 p.m. Monday - Friday. Please note that voicemails left after 4:00 p.m. may not be returned until the following business day.  We are closed weekends and major holidays. You have access to a nurse at all times for urgent questions. Please call the main number to the clinic Dept: [phone] and follow the prompts.   For any non-urgent questions, you may also contact your provider using MyChart. We now offer e-Visits for anyone 18 and older to request care online for non-urgent symptoms. For details visit [email or web address].   Also download the MyChart app! Go to the app store, search "MyChart", open the app, select [facility], and log in with your MyChart username and password.   

## 2022-07-21 ENCOUNTER — Inpatient Hospital Stay: Payer: Medicare Other

## 2022-07-21 ENCOUNTER — Other Ambulatory Visit: Payer: Self-pay

## 2022-07-21 VITALS — BP 126/61 | HR 66 | Temp 98.5°F | Resp 18

## 2022-07-21 DIAGNOSIS — C8198 Hodgkin lymphoma, unspecified, lymph nodes of multiple sites: Secondary | ICD-10-CM

## 2022-07-21 DIAGNOSIS — Z5111 Encounter for antineoplastic chemotherapy: Secondary | ICD-10-CM | POA: Diagnosis not present

## 2022-07-21 MED ORDER — PEGFILGRASTIM-CBQV 6 MG/0.6ML ~~LOC~~ SOSY
6.0000 mg | PREFILLED_SYRINGE | Freq: Once | SUBCUTANEOUS | Status: AC
  Administered 2022-07-21: 6 mg via SUBCUTANEOUS
  Filled 2022-07-21: qty 0.6

## 2022-08-01 MED FILL — Dexamethasone Sodium Phosphate Inj 100 MG/10ML: INTRAMUSCULAR | Qty: 1 | Status: AC

## 2022-08-02 ENCOUNTER — Inpatient Hospital Stay: Payer: Medicare Other

## 2022-08-02 ENCOUNTER — Other Ambulatory Visit: Payer: Self-pay

## 2022-08-02 ENCOUNTER — Inpatient Hospital Stay (HOSPITAL_BASED_OUTPATIENT_CLINIC_OR_DEPARTMENT_OTHER): Payer: Medicare Other | Admitting: Physician Assistant

## 2022-08-02 ENCOUNTER — Inpatient Hospital Stay (HOSPITAL_BASED_OUTPATIENT_CLINIC_OR_DEPARTMENT_OTHER): Payer: Medicare Other | Admitting: Hematology and Oncology

## 2022-08-02 VITALS — BP 160/70 | HR 82

## 2022-08-02 VITALS — BP 117/55 | HR 75 | Temp 98.1°F | Resp 16

## 2022-08-02 DIAGNOSIS — Z5111 Encounter for antineoplastic chemotherapy: Secondary | ICD-10-CM | POA: Diagnosis not present

## 2022-08-02 DIAGNOSIS — C8173 Other classical Hodgkin lymphoma, intra-abdominal lymph nodes: Secondary | ICD-10-CM

## 2022-08-02 DIAGNOSIS — Z95828 Presence of other vascular implants and grafts: Secondary | ICD-10-CM

## 2022-08-02 DIAGNOSIS — C8198 Hodgkin lymphoma, unspecified, lymph nodes of multiple sites: Secondary | ICD-10-CM

## 2022-08-02 LAB — CMP (CANCER CENTER ONLY)
ALT: 18 U/L (ref 0–44)
AST: 15 U/L (ref 15–41)
Albumin: 3.7 g/dL (ref 3.5–5.0)
Alkaline Phosphatase: 154 U/L — ABNORMAL HIGH (ref 38–126)
Anion gap: 8 (ref 5–15)
BUN: 15 mg/dL (ref 8–23)
CO2: 26 mmol/L (ref 22–32)
Calcium: 9.1 mg/dL (ref 8.9–10.3)
Chloride: 106 mmol/L (ref 98–111)
Creatinine: 0.7 mg/dL (ref 0.44–1.00)
GFR, Estimated: >60 mL/min (ref >60)
Glucose, Bld: 258 mg/dL — ABNORMAL HIGH (ref 70–99)
Potassium: 3.4 mmol/L — ABNORMAL LOW (ref 3.5–5.1)
Sodium: 140 mmol/L (ref 135–145)
Total Bilirubin: 0.4 mg/dL (ref 0.3–1.2)
Total Protein: 6.2 g/dL — ABNORMAL LOW (ref 6.5–8.1)

## 2022-08-02 LAB — CBC WITH DIFFERENTIAL (CANCER CENTER ONLY)
Abs Immature Granulocytes: 0.31 10*3/uL — ABNORMAL HIGH (ref 0.00–0.07)
Basophils Absolute: 0.1 10*3/uL (ref 0.0–0.1)
Basophils Relative: 1 %
Eosinophils Absolute: 0 10*3/uL (ref 0.0–0.5)
Eosinophils Relative: 0 %
HCT: 25.8 % — ABNORMAL LOW (ref 36.0–46.0)
Hemoglobin: 8.6 g/dL — ABNORMAL LOW (ref 12.0–15.0)
Immature Granulocytes: 3 %
Lymphocytes Relative: 16 %
Lymphs Abs: 1.6 10*3/uL (ref 0.7–4.0)
MCH: 33 pg (ref 26.0–34.0)
MCHC: 33.3 g/dL (ref 30.0–36.0)
MCV: 98.9 fL (ref 80.0–100.0)
Monocytes Absolute: 1.2 10*3/uL — ABNORMAL HIGH (ref 0.1–1.0)
Monocytes Relative: 12 %
Neutro Abs: 6.8 10*3/uL (ref 1.7–7.7)
Neutrophils Relative %: 68 %
Platelet Count: 83 10*3/uL — ABNORMAL LOW (ref 150–400)
RBC: 2.61 MIL/uL — ABNORMAL LOW (ref 3.87–5.11)
RDW: 15.4 % (ref 11.5–15.5)
WBC Count: 10 10*3/uL (ref 4.0–10.5)
nRBC: 0.6 % — ABNORMAL HIGH (ref 0.0–0.2)

## 2022-08-02 MED ORDER — HEPARIN SOD (PORK) LOCK FLUSH 100 UNIT/ML IV SOLN
500.0000 [IU] | Freq: Once | INTRAVENOUS | Status: AC | PRN
  Administered 2022-08-02: 500 [IU]

## 2022-08-02 MED ORDER — SODIUM CHLORIDE 0.9% FLUSH
10.0000 mL | Freq: Once | INTRAVENOUS | Status: AC
  Administered 2022-08-02: 10 mL

## 2022-08-02 MED ORDER — ACETAMINOPHEN 325 MG PO TABS
650.0000 mg | ORAL_TABLET | Freq: Once | ORAL | Status: AC
  Administered 2022-08-02: 650 mg via ORAL
  Filled 2022-08-02: qty 2

## 2022-08-02 MED ORDER — VINBLASTINE SULFATE CHEMO INJECTION 1 MG/ML
6.0000 mg/m2 | Freq: Once | INTRAVENOUS | Status: AC
  Administered 2022-08-02: 12.5 mg via INTRAVENOUS
  Filled 2022-08-02: qty 12.5

## 2022-08-02 MED ORDER — DOXORUBICIN HCL CHEMO IV INJECTION 2 MG/ML
25.0000 mg/m2 | Freq: Once | INTRAVENOUS | Status: AC
  Administered 2022-08-02: 52 mg via INTRAVENOUS
  Filled 2022-08-02: qty 26

## 2022-08-02 MED ORDER — SODIUM CHLORIDE 0.9 % IV SOLN: Freq: Once | INTRAVENOUS | Status: AC

## 2022-08-02 MED ORDER — SODIUM CHLORIDE 0.9 % IV SOLN
375.0000 mg/m2 | Freq: Once | INTRAVENOUS | Status: AC
  Administered 2022-08-02: 800 mg via INTRAVENOUS
  Filled 2022-08-02: qty 80

## 2022-08-02 MED ORDER — PALONOSETRON HCL INJECTION 0.25 MG/5ML
0.2500 mg | Freq: Once | INTRAVENOUS | Status: AC
  Administered 2022-08-02: 0.25 mg via INTRAVENOUS
  Filled 2022-08-02: qty 5

## 2022-08-02 MED ORDER — SODIUM CHLORIDE 0.9 % IV SOLN
10.0000 mg | Freq: Once | INTRAVENOUS | Status: AC
  Administered 2022-08-02: 10 mg via INTRAVENOUS
  Filled 2022-08-02: qty 10

## 2022-08-02 MED ORDER — FAMOTIDINE IN NACL 20-0.9 MG/50ML-% IV SOLN
20.0000 mg | Freq: Once | INTRAVENOUS | Status: AC
  Administered 2022-08-02: 20 mg via INTRAVENOUS

## 2022-08-02 MED ORDER — SODIUM CHLORIDE 0.9% FLUSH
10.0000 mL | INTRAVENOUS | Status: DC | PRN
  Administered 2022-08-02: 10 mL

## 2022-08-02 NOTE — Progress Notes (Signed)
[facility] [phone]   [phone]  PROGRESS NOTE  Patient Care Team: System, Provider Not In as PCP - General Fleet Contras, MD (Internal Medicine)  Hematological/Oncological History # Hodkgin's Lymphoma, Stage III 12/09/2021: CT Abdomen/Pelvis showed retroperitoneal adenopathy, with 2.1 x 3.6 cm node 12/20/2021: establish care with [facility]  12/27/2021: CT abdominal mass biopsy performed, findings consistent with classical Hodgkin's lymphoma. 01/05/2022: PET CT scan showed hypermetabolic abdominal adenopathy consistent with lymphoma.  She has mild hypermetabolism involving the mediastinal and bilateral axillary nodes. 01/17/2022: Cycle 1, Day 1 of AVD chemotherapy 01/31/2022: Cycle 1, Day 15 of AVD chemotherapy 04/24/2021: Cycle 2 Day 15 of AVD chemotherapy 05/09/2022: Cycle 3, Day 1 of AVD chemotherapy 06/06/2022: Cycle 3, Day 15 of AVD chemotherapy 06/14/2022: Mid-treatment PET/CT scan: Improved thoracoabdominal lymphadenopathy (Deauville criteria 2-3).  06/20/2022: Cycle 4, Day 1 of AVD chemotherapy HELD due to right inguinal abscess.  07/05/2022: Cycle 4, Day 1 of AVD chemotherapy  07/19/2022: Cycle 4, Day 15 of AVD chemotherapy  08/02/2022: Cycle 5, Day 1 of AVD chemotherapy   Interval History:  Heather Petty 72 y.o. female with medical history significant for newly diagnosed Hodgkin's lymphoma who presents for a follow up visit. The patient's last visit was on 07/19/2022. In the interim she completed Cycle 4, Day 15 of AVD chemotherapy.  On exam today Heather Petty her last cycle of chemotherapy went well.  She Petty it was "not bad at all".  She notes that she enjoyed talking with her chemo nurse who was "delirious".  She notes that she did not have any side effects.  She does have some sensitivity of her tongue, particularly spicy and some change in her taste.  She Petty her appetite has "not been great".  She notes that she  "eats whenever I like".  She notes her energy levels are coming back and overall she feels improvement.  She is having some bouts of constipation and occasional runny nose, but otherwise has been at her baseline level of health..  She denies any fevers, chills, sweats, shortness of breath, chest pain or cough. She has no other complaints. Rest of the ROS is below.   MEDICAL HISTORY:  Past Medical History:  Diagnosis Date   AKI (acute kidney injury) (HCC) 12/17/2017   Allergic rhinitis    Anemia    Arthritis    Asthma    Autonomic neuropathy    Depression    Diabetes mellitus    Diabetes mellitus without complication (HCC)    E coli infection    GERD (gastroesophageal reflux disease)    GERD (gastroesophageal reflux disease)    Gout    Hepatitis A    High cholesterol    Hypertension    Insomnia    Kidney failure    Metabolic encephalopathy    Morbid obesity (HCC)    NASH (nonalcoholic steatohepatitis)    Pneumonia 12/17/2017   Schizophrenia (HCC)    Stroke (HCC)    Vitamin B deficiency    Vitamin D deficiency     SURGICAL HISTORY: Past Surgical History:  Procedure Laterality Date   CYST EXCISION     buttucks   DENTAL SURGERY     IR IMAGING GUIDED PORT INSERTION  01/10/2022   IR IMAGING GUIDED PORT INSERTION  04/21/2022   IR REMOVAL TUN ACCESS W/ PORT W/O FL MOD SED  02/28/2022   TONSILLECTOMY      SOCIAL HISTORY: Social History   Socioeconomic History   Marital  status: Unknown    Spouse name: Not on file   Number of children: 2   Years of education: Not on file   Highest education level: Not on file  Occupational History   Occupation: retired/disabled  Tobacco Use   Smoking status: Never   Smokeless tobacco: Never  Vaping Use   Vaping Use: Never used  Substance and Sexual Activity   Alcohol use: Never   Drug use: Never   Sexual activity: Not Currently    Birth control/protection: None  Other Topics Concern   Not on file  Social History Narrative   **  Merged History Encounter **       Social Determinants of Health   Financial Resource Strain: Not on file  Food Insecurity: No Food Insecurity (05/20/2022)   Hunger Vital Sign    Worried About Running Out of Food in the Last Year: Never true    Ran Out of Food in the Last Year: Never true  Transportation Needs: No Transportation Needs (05/20/2022)   PRAPARE - Administrator, Civil Service (Medical): No    Lack of Transportation (Non-Medical): No  Physical Activity: Not on file  Stress: Not on file  Social Connections: Not on file  Intimate Partner Violence: Not At Risk (05/20/2022)   Humiliation, Afraid, Rape, and Kick questionnaire    Fear of Current or Ex-Partner: No    Emotionally Abused: No    Physically Abused: No    Sexually Abused: No    FAMILY HISTORY: Family History  Adopted: Yes  Problem Relation Age of Onset   COPD Daughter     ALLERGIES:  is allergic to vancomycin, emend [aprepitant], fosaprepitant, lasix [furosemide], lasix [furosemide], and pineapple.  MEDICATIONS:  Current Outpatient Medications  Medication Sig Dispense Refill   acetaminophen (TYLENOL) 500 MG tablet Take 1,000 mg by mouth every 6 (six) hours as needed (for pain- CANNOT EXCEED A TOTAL OF 3,000 MG/DAY FROM ALL COMBINED SOURCES).     albuterol (VENTOLIN HFA) 108 (90 Base) MCG/ACT inhaler Inhale 2 puffs into the lungs every 4 (four) hours as needed for wheezing or shortness of breath.     allopurinol (ZYLOPRIM) 100 MG tablet Take 100 mg by mouth daily.     Amino Acids-Protein Hydrolys (PRO-STAT) LIQD Take 30 mLs by mouth 2 (two) times daily between meals.     amLODipine (NORVASC) 10 MG tablet Take 10 mg by mouth daily.     ASPERCREME LIDOCAINE 4 % Place 1 patch onto the skin See admin instructions. Apply 1 patch to both knees once a day- remove as directed     calamine lotion Apply 1 Application topically 2 (two) times daily.  0   carvedilol (COREG) 6.25 MG tablet Take 6.25 mg by mouth 2  (two) times daily with a meal.     cholecalciferol (VITAMIN D3) 25 MCG (1000 UNIT) tablet Take 2,000 Units by mouth daily.     doxepin (SINEQUAN) 25 MG capsule Take 25 mg by mouth at bedtime.     doxycycline (VIBRA-TABS) 100 MG tablet Take 1 tablet (100 mg total) by mouth 2 (two) times daily. 14 tablet 0   Emollient (EUCERIN BABY EX) Apply 1 application  topically See admin instructions. Apply to affected areas daily where the skin is peeling     famotidine (PEPCID) 20 MG tablet Take 20 mg by mouth every 12 (twelve) hours.     fluticasone (FLONASE) 50 MCG/ACT nasal spray Place 1 spray into both nostrils daily.  glipiZIDE (GLUCOTROL XL) 10 MG 24 hr tablet Take 10 mg by mouth in the morning and at bedtime.     hydrOXYzine (ATARAX/VISTARIL) 25 MG tablet Take 25 mg by mouth in the morning and at bedtime.     insulin aspart (NOVOLOG) 100 UNIT/ML injection Inject 0-12 Units into the skin See admin instructions. Inject 0-12 units into the skin 4 times a day (before meals and at bedtime) per sliding scale: BGL <60 or >400  = CALL MD; 60-200 = 0 units; 201-250 = 2 units; 251-300 = 4 units; 301-350 = 6 units; 351-400 = 8 units; 401-450 = 10 units; >450 = 12 units     ipratropium-albuterol (DUONEB) 0.5-2.5 (3) MG/3ML SOLN Take 3 mLs by nebulization 3 (three) times daily.     LANTUS SOLOSTAR 100 UNIT/ML Solostar Pen Inject 28-42 Units into the skin See admin instructions. Inject 42 units into the skin in the morning and 28 units at bedtime     lidocaine-prilocaine (EMLA) cream Apply 1 Application topically as needed. (Patient taking differently: Apply 1 Application topically See admin instructions. Apply to port site 1-2 hours prior to chemo- cover with saran wrap and secure with tape) 30 g 0   lisinopril (ZESTRIL) 5 MG tablet Take 5 mg by mouth daily.     loperamide (IMODIUM) 2 MG capsule Take 1 capsule (2 mg total) by mouth as needed for diarrhea or loose stools. 30 capsule 0   loratadine (CLARITIN) 10 MG  tablet Take 10 mg by mouth daily.     Nutritional Supplements (BOOST GLUCOSE CONTROL) LIQD Take 237 mLs by mouth 2 (two) times daily between meals.     nystatin (MYCOSTATIN/NYSTOP) powder Apply topically 2 (two) times daily. (Patient not taking: Reported on 08/02/2022) 15 g 0   polyethylene glycol powder (GLYCOLAX/MIRALAX) 17 GM/SCOOP powder Take 17 g by mouth daily as needed for mild constipation (to be mixed into 4-8 ounces of a beverage).     prochlorperazine (COMPAZINE) 10 MG tablet Take 1 tablet (10 mg total) by mouth every 6 (six) hours as needed for nausea or vomiting. 30 tablet 0   Simethicone (GAS-X ULTRA STRENGTH) 180 MG CAPS Take 1 capsule by mouth every 8 (eight) hours as needed (gas).     SYSTANE BALANCE 0.6 % SOLN Place 1 drop into both eyes every 12 (twelve) hours.     No current facility-administered medications for this visit.   Facility-Administered Medications Ordered in Other Visits  Medication Dose Route Frequency Provider Last Rate Last Admin   0.9 %  sodium chloride infusion   Intravenous Continuous Allred, Darrell K, PA-C       sodium chloride flush (NS) 0.9 % injection 10 mL  10 mL Intracatheter PRN Jaci Standard, MD   10 mL at 08/02/22 1523    REVIEW OF SYSTEMS:   Constitutional: ( - ) fevers, ( - )  chills , ( - ) night sweats Eyes: ( - ) blurriness of vision, ( - ) double vision, ( - ) watery eyes Ears, nose, mouth, throat, and face: ( - ) mucositis, ( - ) sore throat Respiratory: ( - ) cough, ( - ) dyspnea, ( - ) wheezes Cardiovascular: ( - ) palpitation, ( - ) chest discomfort, ( - ) lower extremity swelling Gastrointestinal:  ( -) nausea, ( - ) heartburn, ( - ) change in bowel habits Skin: ( - ) abnormal skin rashes Lymphatics: ( - ) new lymphadenopathy, ( - ) easy bruising Neurological: ( - )  numbness, ( - ) tingling, ( - ) new weaknesses Behavioral/Psych: ( - ) mood change, ( - ) new changes  All other systems were reviewed with the patient and are  negative.  PHYSICAL EXAMINATION: ECOG PERFORMANCE STATUS: 2 - Symptomatic, <50% confined to bed  Vitals:   08/02/22 0947  BP: (!) 117/55  Pulse: 75  Resp: 16  Temp: 98.1 F (36.7 C)  SpO2: 100%    There were no vitals filed for this visit.   GENERAL: Well-appearing elderly female, alert, no distress and comfortable SKIN: skin color, texture, turgor are normal, no rashes or significant lesions. Mild erythema over port site, no discharge or tenderness to palpation.  EYES: conjunctiva are pink and non-injected, sclera clear LUNGS: clear to auscultation and percussion with normal breathing effort HEART: regular rate & rhythm and no murmurs and no lower extremity edema Musculoskeletal: no cyanosis of digits and no clubbing  PSYCH: alert & oriented x 3, fluent speech NEURO: no focal motor/sensory deficits   LABORATORY DATA:  I have reviewed the data as listed    Latest Ref Rng & Units 08/02/2022    9:11 AM 07/19/2022   11:21 AM 07/05/2022    8:51 AM  CBC  WBC 4.0 - 10.5 K/uL 10.0  8.7  7.9   Hemoglobin 12.0 - 15.0 g/dL 8.6  43.3  29.5   Hematocrit 36.0 - 46.0 % 25.8  31.0  33.8   Platelets 150 - 400 K/uL 83  118  152        Latest Ref Rng & Units 08/02/2022    9:11 AM 07/19/2022   11:21 AM 07/05/2022    8:51 AM  CMP  Glucose 70 - 99 mg/dL 188  416  606   BUN 8 - 23 mg/dL 15  16  31    Creatinine 0.44 - 1.00 mg/dL 3.01  6.01  0.93   Sodium 135 - 145 mmol/L 140  141  137   Potassium 3.5 - 5.1 mmol/L 3.4  3.7  4.4   Chloride 98 - 111 mmol/L 106  108  106   CO2 22 - 32 mmol/L 26  26  25    Calcium 8.9 - 10.3 mg/dL 9.1  9.4  9.6   Total Protein 6.5 - 8.1 g/dL 6.2  6.7  6.9   Total Bilirubin 0.3 - 1.2 mg/dL 0.4  0.4  0.4   Alkaline Phos 38 - 126 U/L 154  134  145   AST 15 - 41 U/L 15  14  17    ALT 0 - 44 U/L 18  14  16      RADIOGRAPHIC STUDIES: No results found.  ASSESSMENT & PLAN Anevay Campanella 72 y.o. female with medical history significant for newly diagnosed  Hodgkin's lymphoma who presents for a follow up visit.   #Hodgkin's Lymphoma, Stage III --biopsy of abdominal lymph node on 12/27/2021 showed classical Hodgkin's lymphoma.  --Pre-treatment PET scan from [place] 5 abdominal adenopathy along with mild hypermetabolism involving mediastinal and bilateral axillary nodes. --Due to neutropenic fever, GCSF injection was added after Cycle 2, Day 15.  --Mid treatment PET scan from 06/14/2022 showed improved thoracoabdominal lymphadenopathy, Deauville score 2-3. Sleen is normal in size without focal lesion. PLAN: --today patient is due for Cycle 5, Day 1 of AVD chemotherapy.  --Labs from today show white blood cell 10, Hgb 8.6, MCV 98.9, Plt 83. Creatinine and LFTs are normal. --RTC in 2 weeks for reassessment prior to Cycle 5 Day 15 of AVD chemotherapy.   #  Right inguinal abscess--resolved: --Seen on PET scan from 06/14/2022 that showed 3.7 x 2.0 cm lesion along right perineum  --doxycycline therapy x 7 days with refill x 1.  -- Patient was evaluated by wound care team at her facility yesterday who confirm wound has healed and does not require any further packing.   #Port issues: --Removed original port in January 2024 and replaced it in March 2024. --Patient was recently admitted from 05/18/2022-05/24/2022 due to febrile neutropenia. Due to erythema at port site, concern was port infection. IR was consulted and advised to treat conservatively now and continue to use the access.  --Advised to try OTC benadryl or hydrocortisone cream for the pruritus.  #Sigmoid colonic focal hypermetabolism #Hepatic steatosis with possible cirrhosis --Will request follow up with GI to further evaluate.  #Supportive Care -- chemotherapy education complete -- port placement complete --baseline echo from 01/06/2022 showed EF 60-65% -- not planning for bleomycin, no need for PFTs.  --  compazine 10mg  PO q6H for nausea (zofran contraindicated due to QT prolongated  medications).  -- allopurinol 300mg  PO daily for TLS prophylaxis -- EMLA cream for port -- no pain medication required at this time.    No orders of the defined types were placed in this encounter.   All questions were answered. The patient knows to call the clinic with any problems, questions or concerns.  I have spent a total of 30 minutes minutes of face-to-face and non-face-to-face time, preparing to see the patient, performing a medically appropriate examination, counseling and educating the patient, ordering tests/procedures, communicating with other health care professionals, documenting clinical information in the electronic health record,  and care coordination.   Ulysees Barns, MD Department of Hematology/Oncology [facility] Phone: [phone] Pager: [phone] Email: [email or web address]  08/02/2022 4:06 PM

## 2022-08-02 NOTE — Progress Notes (Signed)
1229- pt reported feeling SOB and chest discomfort at 9/10. No chemotherapy was running at this time.   Benedict Needy was called. At approximately 12:32pm, IV pepcid was given and an EKG was taken. Sinus rhythm with PVC's was noted. Per Dr Leonides Schanz, pt had this ryhthm previously. Pt recommended by Dr Leonides Schanz and Benedict Needy to f/u with cardiologist. Pt soon returned to baseline and tx was resumed per Dr Leonides Schanz.  1502- Pt later had scant vomitting and nausea during DTIC infusion. Infusion was stopped and saline was administered. VS WNL. Per Dr Leonides Schanz, resume tx when pt returned to baseline. Pt shortly reported she felt back to baseline and DTIC was resumed.  1512- Pt had another episode of nausea with vomiting near the completion of DTIC. Infusion was stopped and saline was administered. VSS stable. Pt then reported chest pain 6/10 and SOB. Benedict Needy was called and pepcid was administered. Jae Dire PA-C spoke with Dr Leonides Schanz and decided to stop tx and allow pt to be d/c when she felt that she was back to baseline. Pt verbalized understanding and was d/c at approximately 3:45pm once discomfort had stopped.

## 2022-08-02 NOTE — Progress Notes (Signed)
Per Dr Leonides Schanz, ok to tx with low platelets.

## 2022-08-02 NOTE — Progress Notes (Signed)
    DATE:  08/02/22  Called to infusion center as patient was experiencing chest pain after finishing her Adriamycin infusion.  Chest pain is located in the middle of her chest and feels like a burning sensation.  She states she had similar pain when she first arrived after eating her breakfast which included a sausage egg and bacon sandwich.  The pain has been slowly worsening while here.  Patient given IV Pepcid and pain resolved.  EKG unchanged from previous, shows prolonged QT.  Dr. Leonides Schanz was notified and treatment was resumed once patient returned to baseline.  I was notified again when patient was finishing up treatment that she was experiencing nausea and vomiting during DTIC infusion.  She had only approximately 50 cc remaining.  I again discussed patient with Dr. Leonides Schanz and he agrees with plan to end treatment for today.  Patient was observed until she returned to baseline.  Did not give Zofran with prolonged QT and did not see it listed as a home medication.   VS: BP:     142/68   P:       82       SPO2:       100% RA                BP:     151/74   P:       79       SPO2:       100% RA

## 2022-08-04 ENCOUNTER — Inpatient Hospital Stay: Payer: Medicare Other

## 2022-08-04 ENCOUNTER — Other Ambulatory Visit: Payer: Self-pay

## 2022-08-04 VITALS — BP 134/57 | HR 66 | Temp 98.7°F | Resp 16

## 2022-08-04 DIAGNOSIS — Z5111 Encounter for antineoplastic chemotherapy: Secondary | ICD-10-CM | POA: Diagnosis not present

## 2022-08-04 DIAGNOSIS — C8198 Hodgkin lymphoma, unspecified, lymph nodes of multiple sites: Secondary | ICD-10-CM

## 2022-08-04 MED ORDER — PEGFILGRASTIM-CBQV 6 MG/0.6ML ~~LOC~~ SOSY
6.0000 mg | PREFILLED_SYRINGE | Freq: Once | SUBCUTANEOUS | Status: AC
  Administered 2022-08-04: 6 mg via SUBCUTANEOUS
  Filled 2022-08-04: qty 0.6

## 2022-08-15 MED FILL — Dexamethasone Sodium Phosphate Inj 100 MG/10ML: INTRAMUSCULAR | Qty: 1 | Status: AC

## 2022-08-15 NOTE — Progress Notes (Unsigned)
[facility] [phone]   [phone]  PROGRESS NOTE  Patient Care Team: System, Provider Not In as PCP - General Fleet Contras, MD (Internal Medicine)  Hematological/Oncological History # Hodkgin's Lymphoma, Stage III 12/09/2021: CT Abdomen/Pelvis showed retroperitoneal adenopathy, with 2.1 x 3.6 cm node 12/20/2021: establish care with [facility]  12/27/2021: CT abdominal mass biopsy performed, findings consistent with classical Hodgkin's lymphoma. 01/05/2022: PET CT scan showed hypermetabolic abdominal adenopathy consistent with lymphoma.  She has mild hypermetabolism involving the mediastinal and bilateral axillary nodes. 01/17/2022: Cycle 1, Day 1 of AVD chemotherapy 01/31/2022: Cycle 1, Day 15 of AVD chemotherapy 04/24/2021: Cycle 2 Day 15 of AVD chemotherapy 05/09/2022: Cycle 3, Day 1 of AVD chemotherapy 06/06/2022: Cycle 3, Day 15 of AVD chemotherapy 06/14/2022: Mid-treatment PET/CT scan: Improved thoracoabdominal lymphadenopathy (Deauville criteria 2-3).  06/20/2022: Cycle 4, Day 1 of AVD chemotherapy HELD due to right inguinal abscess.  07/05/2022: Cycle 4, Day 1 of AVD chemotherapy  07/19/2022: Cycle 4, Day 15 of AVD chemotherapy  08/02/2022: Cycle 5, Day 1 of AVD chemotherapy  08/16/2022: Cycle 5, Day 15 of AVD chemotherapy HELD due to Hgb 6.7  Interval History:  Heather Petty 72 y.o. female with medical history significant for newly diagnosed Hodgkin's lymphoma who presents for a follow up visit. The patient's last visit was on 08/02/2022. In the interim she completed Cycle 5, Day 1 of AVD chemotherapy.  On exam today [name] reports she has developed some red sores on her chest near her port site.  She also reports that she has developed some sores in her mouth and a wisdom tooth has become loose.  She has had a recent sudden drop in her energy levels with shortness of breath, lightheadedness and dizziness.  She notes that she was told  to go to the hospital on Friday for transfusion but she did not because she had family in town.  She knows she has not had any overt signs of bleeding such as nosebleeds, gum bleeding, or dark stools.  No overt blood in the urine or stool.  She reports that she is having some occasional nausea but no vomiting or diarrhea.  Her appetite is "so-so".  She notes that she is losing weight but is happy about that.  She is also been not having as frequent of stools.  She denies any fevers, chills, sweats, shortness of breath, chest pain or cough. She has no other complaints. Rest of the ROS is below.   MEDICAL HISTORY:  Past Medical History:  Diagnosis Date   AKI (acute kidney injury) (HCC) 12/17/2017   Allergic rhinitis    Anemia    Arthritis    Asthma    Autonomic neuropathy    Depression    Diabetes mellitus    Diabetes mellitus without complication (HCC)    E coli infection    GERD (gastroesophageal reflux disease)    GERD (gastroesophageal reflux disease)    Gout    Hepatitis A    High cholesterol    Hypertension    Insomnia    Kidney failure    Metabolic encephalopathy    Morbid obesity (HCC)    NASH (nonalcoholic steatohepatitis)    Pneumonia 12/17/2017   Schizophrenia (HCC)    Stroke (HCC)    Vitamin B deficiency    Vitamin D deficiency     SURGICAL HISTORY: Past Surgical History:  Procedure Laterality Date   CYST EXCISION     buttucks   DENTAL SURGERY  IR IMAGING GUIDED PORT INSERTION  01/10/2022   IR IMAGING GUIDED PORT INSERTION  04/21/2022   IR REMOVAL TUN ACCESS W/ PORT W/O FL MOD SED  02/28/2022   TONSILLECTOMY      SOCIAL HISTORY: Social History   Socioeconomic History   Marital status: Unknown    Spouse name: Not on file   Number of children: 2   Years of education: Not on file   Highest education level: Not on file  Occupational History   Occupation: retired/disabled  Tobacco Use   Smoking status: Never   Smokeless tobacco: Never  Vaping Use    Vaping Use: Never used  Substance and Sexual Activity   Alcohol use: Never   Drug use: Never   Sexual activity: Not Currently    Birth control/protection: None  Other Topics Concern   Not on file  Social History Narrative   ** Merged History Encounter **       Social Determinants of Health   Financial Resource Strain: Not on file  Food Insecurity: No Food Insecurity (05/20/2022)   Hunger Vital Sign    Worried About Running Out of Food in the Last Year: Never true    Ran Out of Food in the Last Year: Never true  Transportation Needs: No Transportation Needs (05/20/2022)   PRAPARE - Administrator, Civil Service (Medical): No    Lack of Transportation (Non-Medical): No  Physical Activity: Not on file  Stress: Not on file  Social Connections: Not on file  Intimate Partner Violence: Not At Risk (05/20/2022)   Humiliation, Afraid, Rape, and Kick questionnaire    Fear of Current or Ex-Partner: No    Emotionally Abused: No    Physically Abused: No    Sexually Abused: No    FAMILY HISTORY: Family History  Adopted: Yes  Problem Relation Age of Onset   COPD Daughter     ALLERGIES:  is allergic to vancomycin, emend [aprepitant], fosaprepitant, lasix [furosemide], lasix [furosemide], and pineapple.  MEDICATIONS:  Current Outpatient Medications  Medication Sig Dispense Refill   acetaminophen (TYLENOL) 500 MG tablet Take 1,000 mg by mouth every 6 (six) hours as needed (for pain- CANNOT EXCEED A TOTAL OF 3,000 MG/DAY FROM ALL COMBINED SOURCES).     albuterol (VENTOLIN HFA) 108 (90 Base) MCG/ACT inhaler Inhale 2 puffs into the lungs every 4 (four) hours as needed for wheezing or shortness of breath.     allopurinol (ZYLOPRIM) 100 MG tablet Take 100 mg by mouth daily.     Amino Acids-Protein Hydrolys (PRO-STAT) LIQD Take 30 mLs by mouth 2 (two) times daily between meals.     amLODipine (NORVASC) 10 MG tablet Take 10 mg by mouth daily.     ASPERCREME LIDOCAINE 4 % Place 1  patch onto the skin See admin instructions. Apply 1 patch to both knees once a day- remove as directed     calamine lotion Apply 1 Application topically 2 (two) times daily.  0   carvedilol (COREG) 6.25 MG tablet Take 6.25 mg by mouth 2 (two) times daily with a meal.     cholecalciferol (VITAMIN D3) 25 MCG (1000 UNIT) tablet Take 2,000 Units by mouth daily.     doxepin (SINEQUAN) 25 MG capsule Take 25 mg by mouth at bedtime.     doxycycline (VIBRA-TABS) 100 MG tablet Take 1 tablet (100 mg total) by mouth 2 (two) times daily. 14 tablet 0   Emollient (EUCERIN BABY EX) Apply 1 application  topically See  admin instructions. Apply to affected areas daily where the skin is peeling     famotidine (PEPCID) 20 MG tablet Take 20 mg by mouth every 12 (twelve) hours.     fluticasone (FLONASE) 50 MCG/ACT nasal spray Place 1 spray into both nostrils daily.     glipiZIDE (GLUCOTROL XL) 10 MG 24 hr tablet Take 10 mg by mouth in the morning and at bedtime.     hydrOXYzine (ATARAX/VISTARIL) 25 MG tablet Take 25 mg by mouth in the morning and at bedtime.     insulin aspart (NOVOLOG) 100 UNIT/ML injection Inject 0-12 Units into the skin See admin instructions. Inject 0-12 units into the skin 4 times a day (before meals and at bedtime) per sliding scale: BGL <60 or >400  = CALL MD; 60-200 = 0 units; 201-250 = 2 units; 251-300 = 4 units; 301-350 = 6 units; 351-400 = 8 units; 401-450 = 10 units; >450 = 12 units     ipratropium-albuterol (DUONEB) 0.5-2.5 (3) MG/3ML SOLN Take 3 mLs by nebulization 3 (three) times daily.     LANTUS SOLOSTAR 100 UNIT/ML Solostar Pen Inject 28-42 Units into the skin See admin instructions. Inject 42 units into the skin in the morning and 28 units at bedtime     lidocaine-prilocaine (EMLA) cream Apply 1 Application topically as needed. (Patient taking differently: Apply 1 Application topically See admin instructions. Apply to port site 1-2 hours prior to chemo- cover with saran wrap and secure  with tape) 30 g 0   lisinopril (ZESTRIL) 5 MG tablet Take 5 mg by mouth daily.     loperamide (IMODIUM) 2 MG capsule Take 1 capsule (2 mg total) by mouth as needed for diarrhea or loose stools. 30 capsule 0   loratadine (CLARITIN) 10 MG tablet Take 10 mg by mouth daily.     Nutritional Supplements (BOOST GLUCOSE CONTROL) LIQD Take 237 mLs by mouth 2 (two) times daily between meals.     nystatin (MYCOSTATIN/NYSTOP) powder Apply topically 2 (two) times daily. (Patient not taking: Reported on 08/02/2022) 15 g 0   polyethylene glycol powder (GLYCOLAX/MIRALAX) 17 GM/SCOOP powder Take 17 g by mouth daily as needed for mild constipation (to be mixed into 4-8 ounces of a beverage).     prochlorperazine (COMPAZINE) 10 MG tablet Take 1 tablet (10 mg total) by mouth every 6 (six) hours as needed for nausea or vomiting. 30 tablet 0   Simethicone (GAS-X ULTRA STRENGTH) 180 MG CAPS Take 1 capsule by mouth every 8 (eight) hours as needed (gas).     SYSTANE BALANCE 0.6 % SOLN Place 1 drop into both eyes every 12 (twelve) hours.     No current facility-administered medications for this visit.   Facility-Administered Medications Ordered in Other Visits  Medication Dose Route Frequency Provider Last Rate Last Admin   0.9 %  sodium chloride infusion   Intravenous Continuous Allred, Darrell K, PA-C       heparin lock flush 100 unit/mL  500 Units Intracatheter Daily PRN Ulysees Barns IV, MD       sodium chloride flush (NS) 0.9 % injection 10 mL  10 mL Intracatheter PRN Jaci Standard, MD        REVIEW OF SYSTEMS:   Constitutional: ( - ) fevers, ( - )  chills , ( - ) night sweats Eyes: ( - ) blurriness of vision, ( - ) double vision, ( - ) watery eyes Ears, nose, mouth, throat, and face: ( - )  mucositis, ( - ) sore throat Respiratory: ( - ) cough, ( - ) dyspnea, ( - ) wheezes Cardiovascular: ( - ) palpitation, ( - ) chest discomfort, ( - ) lower extremity swelling Gastrointestinal:  ( -) nausea, ( - )  heartburn, ( - ) change in bowel habits Skin: ( - ) abnormal skin rashes Lymphatics: ( - ) new lymphadenopathy, ( - ) easy bruising Neurological: ( - ) numbness, ( - ) tingling, ( - ) new weaknesses Behavioral/Psych: ( - ) mood change, ( - ) new changes  All other systems were reviewed with the patient and are negative.  PHYSICAL EXAMINATION: ECOG PERFORMANCE STATUS: 2 - Symptomatic, <50% confined to bed  Vitals:   08/16/22 0937  BP: (!) 129/47  Pulse: 98  Resp: 15  Temp: 98 F (36.7 C)  SpO2: 100%     There were no vitals filed for this visit.   GENERAL: Well-appearing elderly female, alert, no distress and comfortable SKIN: skin color, texture, turgor are normal, no rashes or significant lesions. Mild erythema over port site, no discharge or tenderness to palpation.  EYES: conjunctiva are pink and non-injected, sclera clear LUNGS: clear to auscultation and percussion with normal breathing effort HEART: regular rate & rhythm and no murmurs and no lower extremity edema Musculoskeletal: no cyanosis of digits and no clubbing  PSYCH: alert & oriented x 3, fluent speech NEURO: no focal motor/sensory deficits   LABORATORY DATA:  I have reviewed the data as listed    Latest Ref Rng & Units 08/16/2022    8:58 AM 08/02/2022    9:11 AM 07/19/2022   11:21 AM  CBC  WBC 4.0 - 10.5 K/uL 8.7  10.0  8.7   Hemoglobin 12.0 - 15.0 g/dL 6.7  8.6  16.1   Hematocrit 36.0 - 46.0 % 20.9  25.8  31.0   Platelets 150 - 400 K/uL 135  83  118        Latest Ref Rng & Units 08/16/2022    8:58 AM 08/02/2022    9:11 AM 07/19/2022   11:21 AM  CMP  Glucose 70 - 99 mg/dL 096  045  409   BUN 8 - 23 mg/dL 24  15  16    Creatinine 0.44 - 1.00 mg/dL 8.11  9.14  7.82   Sodium 135 - 145 mmol/L 142  140  141   Potassium 3.5 - 5.1 mmol/L 3.7  3.4  3.7   Chloride 98 - 111 mmol/L 108  106  108   CO2 22 - 32 mmol/L 25  26  26    Calcium 8.9 - 10.3 mg/dL 9.2  9.1  9.4   Total Protein 6.5 - 8.1 g/dL 6.3  6.2   6.7   Total Bilirubin 0.3 - 1.2 mg/dL 0.4  0.4  0.4   Alkaline Phos 38 - 126 U/L 159  154  134   AST 15 - 41 U/L 15  15  14    ALT 0 - 44 U/L 19  18  14      RADIOGRAPHIC STUDIES: No results found.  ASSESSMENT & PLAN Heather Petty 72 y.o. female with medical history significant for newly diagnosed Hodgkin's lymphoma who presents for a follow up visit.   #Hodgkin's Lymphoma, Stage III --biopsy of abdominal lymph node on 12/27/2021 showed classical Hodgkin's lymphoma.  --Pre-treatment PET scan from [place] 5 abdominal adenopathy along with mild hypermetabolism involving mediastinal and bilateral axillary nodes. --Due to neutropenic fever, GCSF injection was added after  Cycle 2, Day 15.  --Mid treatment PET scan from 06/14/2022 showed improved thoracoabdominal lymphadenopathy, Deauville score 2-3. Sleen is normal in size without focal lesion. PLAN: --today patient is due for Cycle 5, Day 15 of AVD chemotherapy. Will HOLD due to Hgb 6.7 --Labs from today show white blood cell 8.7, Hgb 6.7, MCV 104, Plt 135. Creatinine and LFTs are normal. --RTC in 2 weeks for reassessment prior to Cycle 5 Day 15 of AVD chemotherapy.   #Right inguinal abscess--resolved: --Seen on PET scan from 06/14/2022 that showed 3.7 x 2.0 cm lesion along right perineum  --doxycycline therapy x 7 days with refill x 1.  -- Patient was evaluated by wound care team at her facility yesterday who confirm wound has healed and does not require any further packing.   #Port issues: --Removed original port in January 2024 and replaced it in March 2024. --Patient was recently admitted from 05/18/2022-05/24/2022 due to febrile neutropenia. Due to erythema at port site, concern was port infection. IR was consulted and advised to treat conservatively now and continue to use the access.  --Advised to try OTC benadryl or hydrocortisone cream for the pruritus.  #Sigmoid colonic focal hypermetabolism #Hepatic steatosis with possible  cirrhosis --Will request follow up with GI to further evaluate.  #Supportive Care -- chemotherapy education complete -- port placement complete --baseline echo from 01/06/2022 showed EF 60-65% -- not planning for bleomycin, no need for PFTs.  --  compazine 10mg  PO q6H for nausea (zofran contraindicated due to QT prolongated medications).  -- allopurinol 300mg  PO daily for TLS prophylaxis -- EMLA cream for port -- no pain medication required at this time.    Orders Placed This Encounter  Procedures   CBC with Differential (Cancer Center Only)    Standing Status:   Future    Standing Expiration Date:   08/30/2023   CMP (Cancer Center only)    Standing Status:   Future    Standing Expiration Date:   08/30/2023    All questions were answered. The patient knows to call the clinic with any problems, questions or concerns.  I have spent a total of 30 minutes minutes of face-to-face and non-face-to-face time, preparing to see the patient, performing a medically appropriate examination, counseling and educating the patient, ordering tests/procedures, communicating with other health care professionals, documenting clinical information in the electronic health record,  and care coordination.   Ulysees Barns, MD Department of Hematology/Oncology [facility] Phone: [phone] Pager: [phone] Email: [email or web address]  08/16/2022 11:48 AM

## 2022-08-16 ENCOUNTER — Inpatient Hospital Stay: Payer: Medicare Other

## 2022-08-16 ENCOUNTER — Telehealth: Payer: Self-pay | Admitting: Hematology and Oncology

## 2022-08-16 ENCOUNTER — Other Ambulatory Visit: Payer: Self-pay | Admitting: *Deleted

## 2022-08-16 ENCOUNTER — Inpatient Hospital Stay (HOSPITAL_BASED_OUTPATIENT_CLINIC_OR_DEPARTMENT_OTHER): Payer: Medicare Other | Admitting: Hematology and Oncology

## 2022-08-16 ENCOUNTER — Other Ambulatory Visit: Payer: Self-pay

## 2022-08-16 ENCOUNTER — Inpatient Hospital Stay: Payer: Medicare Other | Attending: Physician Assistant

## 2022-08-16 VITALS — BP 129/47 | HR 98 | Temp 98.0°F | Resp 15

## 2022-08-16 DIAGNOSIS — T451X5A Adverse effect of antineoplastic and immunosuppressive drugs, initial encounter: Secondary | ICD-10-CM | POA: Diagnosis not present

## 2022-08-16 DIAGNOSIS — Z5111 Encounter for antineoplastic chemotherapy: Secondary | ICD-10-CM | POA: Diagnosis present

## 2022-08-16 DIAGNOSIS — C8198 Hodgkin lymphoma, unspecified, lymph nodes of multiple sites: Secondary | ICD-10-CM | POA: Diagnosis not present

## 2022-08-16 DIAGNOSIS — D701 Agranulocytosis secondary to cancer chemotherapy: Secondary | ICD-10-CM | POA: Insufficient documentation

## 2022-08-16 DIAGNOSIS — C8173 Other classical Hodgkin lymphoma, intra-abdominal lymph nodes: Secondary | ICD-10-CM

## 2022-08-16 DIAGNOSIS — Z95828 Presence of other vascular implants and grafts: Secondary | ICD-10-CM

## 2022-08-16 LAB — TYPE AND SCREEN: ABO/RH(D): O NEG

## 2022-08-16 LAB — CBC WITH DIFFERENTIAL (CANCER CENTER ONLY)
Abs Immature Granulocytes: 0.42 10*3/uL — ABNORMAL HIGH (ref 0.00–0.07)
Basophils Absolute: 0 10*3/uL (ref 0.0–0.1)
Basophils Relative: 0 %
Eosinophils Absolute: 0 10*3/uL (ref 0.0–0.5)
Eosinophils Relative: 0 %
HCT: 20.9 % — ABNORMAL LOW (ref 36.0–46.0)
Hemoglobin: 6.7 g/dL — CL (ref 12.0–15.0)
Immature Granulocytes: 5 %
Lymphocytes Relative: 21 %
Lymphs Abs: 1.8 10*3/uL (ref 0.7–4.0)
MCH: 33.3 pg (ref 26.0–34.0)
MCHC: 32.1 g/dL (ref 30.0–36.0)
MCV: 104 fL — ABNORMAL HIGH (ref 80.0–100.0)
Monocytes Absolute: 1.1 10*3/uL — ABNORMAL HIGH (ref 0.1–1.0)
Monocytes Relative: 13 %
Neutro Abs: 5.3 10*3/uL (ref 1.7–7.7)
Neutrophils Relative %: 61 %
Platelet Count: 135 10*3/uL — ABNORMAL LOW (ref 150–400)
RBC: 2.01 MIL/uL — ABNORMAL LOW (ref 3.87–5.11)
RDW: 18.6 % — ABNORMAL HIGH (ref 11.5–15.5)
WBC Count: 8.7 10*3/uL (ref 4.0–10.5)
nRBC: 1.6 % — ABNORMAL HIGH (ref 0.0–0.2)

## 2022-08-16 LAB — CMP (CANCER CENTER ONLY)
ALT: 19 U/L (ref 0–44)
AST: 15 U/L (ref 15–41)
Albumin: 3.6 g/dL (ref 3.5–5.0)
Alkaline Phosphatase: 159 U/L — ABNORMAL HIGH (ref 38–126)
Anion gap: 9 (ref 5–15)
BUN: 24 mg/dL — ABNORMAL HIGH (ref 8–23)
CO2: 25 mmol/L (ref 22–32)
Calcium: 9.2 mg/dL (ref 8.9–10.3)
Chloride: 108 mmol/L (ref 98–111)
Creatinine: 0.93 mg/dL (ref 0.44–1.00)
GFR, Estimated: >60 mL/min (ref >60)
Glucose, Bld: 157 mg/dL — ABNORMAL HIGH (ref 70–99)
Potassium: 3.7 mmol/L (ref 3.5–5.1)
Sodium: 142 mmol/L (ref 135–145)
Total Bilirubin: 0.4 mg/dL (ref 0.3–1.2)
Total Protein: 6.3 g/dL — ABNORMAL LOW (ref 6.5–8.1)

## 2022-08-16 LAB — SAMPLE TO BLOOD BANK

## 2022-08-16 LAB — PREPARE RBC (CROSSMATCH)

## 2022-08-16 MED ORDER — HEPARIN SOD (PORK) LOCK FLUSH 100 UNIT/ML IV SOLN
500.0000 [IU] | Freq: Every day | INTRAVENOUS | Status: AC | PRN
  Administered 2022-08-16: 500 [IU]

## 2022-08-16 MED ORDER — SODIUM CHLORIDE 0.9% FLUSH
10.0000 mL | INTRAVENOUS | Status: AC | PRN
  Administered 2022-08-16: 10 mL

## 2022-08-16 MED ORDER — SODIUM CHLORIDE 0.9% FLUSH
10.0000 mL | Freq: Once | INTRAVENOUS | Status: AC
  Administered 2022-08-16: 10 mL

## 2022-08-16 MED ORDER — SODIUM CHLORIDE 0.9% IV SOLUTION
250.0000 mL | Freq: Once | INTRAVENOUS | Status: AC
  Administered 2022-08-16: 250 mL via INTRAVENOUS

## 2022-08-16 MED ORDER — ACETAMINOPHEN 325 MG PO TABS
650.0000 mg | ORAL_TABLET | Freq: Once | ORAL | Status: AC
  Administered 2022-08-16: 650 mg via ORAL
  Filled 2022-08-16: qty 2

## 2022-08-16 NOTE — Progress Notes (Signed)
Per Dr. Leonides Schanz, ok to increase rate of blood transfusion to 345ml/hr due to delay getting blood from blood bank.

## 2022-08-16 NOTE — Patient Instructions (Signed)

## 2022-08-17 ENCOUNTER — Other Ambulatory Visit: Payer: Self-pay

## 2022-08-17 LAB — BPAM RBC
Blood Product Expiration Date: 202408122359
Blood Product Expiration Date: 202408122359
ISSUE DATE / TIME: 202407101235
ISSUE DATE / TIME: 202407101235
Unit Type and Rh: 9500
Unit Type and Rh: 9500

## 2022-08-17 LAB — TYPE AND SCREEN
Antibody Screen: NEGATIVE
Unit division: 0
Unit division: 0

## 2022-08-18 ENCOUNTER — Ambulatory Visit: Payer: Medicare Other

## 2022-08-24 ENCOUNTER — Encounter: Payer: Self-pay | Admitting: Physician Assistant

## 2022-08-24 ENCOUNTER — Encounter: Payer: Self-pay | Admitting: Hematology and Oncology

## 2022-08-29 MED FILL — Dexamethasone Sodium Phosphate Inj 100 MG/10ML: INTRAMUSCULAR | Qty: 1 | Status: AC

## 2022-08-30 ENCOUNTER — Other Ambulatory Visit: Payer: Self-pay | Admitting: Physician Assistant

## 2022-08-30 ENCOUNTER — Other Ambulatory Visit: Payer: Self-pay

## 2022-08-30 ENCOUNTER — Encounter: Payer: Self-pay | Admitting: Hematology and Oncology

## 2022-08-30 ENCOUNTER — Inpatient Hospital Stay: Payer: Medicare Other

## 2022-08-30 ENCOUNTER — Inpatient Hospital Stay (HOSPITAL_BASED_OUTPATIENT_CLINIC_OR_DEPARTMENT_OTHER): Payer: Medicare Other | Admitting: Physician Assistant

## 2022-08-30 VITALS — BP 142/59 | HR 62 | Temp 97.2°F | Resp 18

## 2022-08-30 VITALS — BP 149/59 | HR 60 | Resp 20 | Wt 197.0 lb

## 2022-08-30 DIAGNOSIS — C8198 Hodgkin lymphoma, unspecified, lymph nodes of multiple sites: Secondary | ICD-10-CM | POA: Diagnosis not present

## 2022-08-30 DIAGNOSIS — L89150 Pressure ulcer of sacral region, unstageable: Secondary | ICD-10-CM | POA: Diagnosis not present

## 2022-08-30 DIAGNOSIS — Z5111 Encounter for antineoplastic chemotherapy: Secondary | ICD-10-CM

## 2022-08-30 DIAGNOSIS — Z95828 Presence of other vascular implants and grafts: Secondary | ICD-10-CM

## 2022-08-30 LAB — CMP (CANCER CENTER ONLY)
ALT: 16 U/L (ref 0–44)
AST: 15 U/L (ref 15–41)
Albumin: 3.9 g/dL (ref 3.5–5.0)
Alkaline Phosphatase: 127 U/L — ABNORMAL HIGH (ref 38–126)
Anion gap: 6 (ref 5–15)
BUN: 20 mg/dL (ref 8–23)
CO2: 27 mmol/L (ref 22–32)
Calcium: 9.6 mg/dL (ref 8.9–10.3)
Chloride: 107 mmol/L (ref 98–111)
Creatinine: 0.7 mg/dL (ref 0.44–1.00)
GFR, Estimated: >60 mL/min (ref >60)
Glucose, Bld: 93 mg/dL (ref 70–99)
Potassium: 3.9 mmol/L (ref 3.5–5.1)
Sodium: 140 mmol/L (ref 135–145)
Total Bilirubin: 0.4 mg/dL (ref 0.3–1.2)
Total Protein: 6.3 g/dL — ABNORMAL LOW (ref 6.5–8.1)

## 2022-08-30 LAB — CBC WITH DIFFERENTIAL (CANCER CENTER ONLY)
Abs Immature Granulocytes: 0.04 10*3/uL (ref 0.00–0.07)
Basophils Absolute: 0 10*3/uL (ref 0.0–0.1)
Basophils Relative: 0 %
Eosinophils Absolute: 0.2 10*3/uL (ref 0.0–0.5)
Eosinophils Relative: 2 %
HCT: 31.4 % — ABNORMAL LOW (ref 36.0–46.0)
Hemoglobin: 10.6 g/dL — ABNORMAL LOW (ref 12.0–15.0)
Immature Granulocytes: 0 %
Lymphocytes Relative: 20 %
Lymphs Abs: 1.9 10*3/uL (ref 0.7–4.0)
MCH: 32.8 pg (ref 26.0–34.0)
MCHC: 33.8 g/dL (ref 30.0–36.0)
MCV: 97.2 fL (ref 80.0–100.0)
Monocytes Absolute: 0.8 10*3/uL (ref 0.1–1.0)
Monocytes Relative: 8 %
Neutro Abs: 6.4 10*3/uL (ref 1.7–7.7)
Neutrophils Relative %: 70 %
Platelet Count: 153 10*3/uL (ref 150–400)
RBC: 3.23 MIL/uL — ABNORMAL LOW (ref 3.87–5.11)
RDW: 18.6 % — ABNORMAL HIGH (ref 11.5–15.5)
WBC Count: 9.3 10*3/uL (ref 4.0–10.5)
nRBC: 0 % (ref 0.0–0.2)

## 2022-08-30 MED ORDER — SODIUM CHLORIDE 0.9 % IV SOLN: Freq: Once | INTRAVENOUS | Status: AC

## 2022-08-30 MED ORDER — VINBLASTINE SULFATE CHEMO INJECTION 1 MG/ML
6.0000 mg/m2 | Freq: Once | INTRAVENOUS | Status: AC
  Administered 2022-08-30: 12.5 mg via INTRAVENOUS
  Filled 2022-08-30: qty 12.5

## 2022-08-30 MED ORDER — DOXORUBICIN HCL CHEMO IV INJECTION 2 MG/ML
25.0000 mg/m2 | Freq: Once | INTRAVENOUS | Status: AC
  Administered 2022-08-30: 52 mg via INTRAVENOUS
  Filled 2022-08-30: qty 26

## 2022-08-30 MED ORDER — SODIUM CHLORIDE 0.9% FLUSH
10.0000 mL | Freq: Once | INTRAVENOUS | Status: AC
  Administered 2022-08-30: 10 mL

## 2022-08-30 MED ORDER — FAMOTIDINE IN NACL 20-0.9 MG/50ML-% IV SOLN
20.0000 mg | Freq: Once | INTRAVENOUS | Status: AC
  Administered 2022-08-30: 20 mg via INTRAVENOUS

## 2022-08-30 MED ORDER — SODIUM CHLORIDE 0.9% FLUSH
10.0000 mL | INTRAVENOUS | Status: DC | PRN
  Administered 2022-08-30: 10 mL

## 2022-08-30 MED ORDER — SODIUM CHLORIDE 0.9 % IV SOLN
10.0000 mg | Freq: Once | INTRAVENOUS | Status: AC
  Administered 2022-08-30: 10 mg via INTRAVENOUS
  Filled 2022-08-30: qty 10
  Filled 2022-08-30: qty 1

## 2022-08-30 MED ORDER — FAMOTIDINE IN NACL 20-0.9 MG/50ML-% IV SOLN
INTRAVENOUS | Status: AC
  Filled 2022-08-30: qty 50

## 2022-08-30 MED ORDER — PALONOSETRON HCL INJECTION 0.25 MG/5ML
0.2500 mg | Freq: Once | INTRAVENOUS | Status: AC
  Administered 2022-08-30: 0.25 mg via INTRAVENOUS
  Filled 2022-08-30: qty 5

## 2022-08-30 MED ORDER — SODIUM CHLORIDE 0.9 % IV SOLN
375.0000 mg/m2 | Freq: Once | INTRAVENOUS | Status: AC
  Administered 2022-08-30: 800 mg via INTRAVENOUS
  Filled 2022-08-30: qty 80

## 2022-08-30 MED ORDER — HEPARIN SOD (PORK) LOCK FLUSH 100 UNIT/ML IV SOLN
500.0000 [IU] | Freq: Once | INTRAVENOUS | Status: AC | PRN
  Administered 2022-08-30: 500 [IU]

## 2022-08-30 NOTE — Progress Notes (Signed)
Pt c/o indigestion and nausea post Adriamycin flush. No c/o chest pain or SOB. VSS. This RN reached out to [employer] and made her aware. This RN received V/O from Earlie Counts for Pepcid 20 mg IV. This RN informed Pt. Pt was agreeable and verbalized understanding.  At 1330 Pt stated "I am feeling better, now I am burping, but at least I am not nauseous."  This RN informed Earlie Counts of symptom resolution. Per Earlie Counts Pepcid to be added as a premedication in the treatment plan.

## 2022-08-30 NOTE — Patient Instructions (Signed)
[place] [facility]  Discharge Instructions: Thank you for choosing [facility] to provide your oncology and hematology care.   If you have a lab appointment with the Cancer Center, please go directly to the Cancer Center and check in at the registration area.   Wear comfortable clothing and clothing appropriate for easy access to any Portacath or PICC line.   We strive to give you quality time with your provider. You may need to reschedule your appointment if you arrive late (15 or more minutes).  Arriving late affects you and other patients whose appointments are after yours.  Also, if you miss three or more appointments without notifying the office, you may be dismissed from the clinic at the provider's discretion.      For prescription refill requests, have your pharmacy contact our office and allow 72 hours for refills to be completed.    Today you received the following chemotherapy and/or immunotherapy agents: Adriamycin/Velban/DTIC      To help prevent nausea and vomiting after your treatment, we encourage you to take your nausea medication as directed.  BELOW ARE SYMPTOMS THAT SHOULD BE REPORTED IMMEDIATELY: *FEVER GREATER THAN 100.4 F (38 C) OR HIGHER *CHILLS OR SWEATING *NAUSEA AND VOMITING THAT IS NOT CONTROLLED WITH YOUR NAUSEA MEDICATION *UNUSUAL SHORTNESS OF BREATH *UNUSUAL BRUISING OR BLEEDING *URINARY PROBLEMS (pain or burning when urinating, or frequent urination) *BOWEL PROBLEMS (unusual diarrhea, constipation, pain near the anus) TENDERNESS IN MOUTH AND THROAT WITH OR WITHOUT PRESENCE OF ULCERS (sore throat, sores in mouth, or a toothache) UNUSUAL RASH, SWELLING OR PAIN  UNUSUAL VAGINAL DISCHARGE OR ITCHING   Items with * indicate a potential emergency and should be followed up as soon as possible or go to the Emergency Department if any problems should occur.  Please show the CHEMOTHERAPY ALERT CARD or IMMUNOTHERAPY ALERT  CARD at check-in to the Emergency Department and triage nurse.  Should you have questions after your visit or need to cancel or reschedule your appointment, please contact [place] [facility]  Dept: [phone]  and follow the prompts.  Office hours are 8:00 a.m. to 4:30 p.m. Monday - Friday. Please note that voicemails left after 4:00 p.m. may not be returned until the following business day.  We are closed weekends and major holidays. You have access to a nurse at all times for urgent questions. Please call the main number to the clinic Dept: [phone] and follow the prompts.   For any non-urgent questions, you may also contact your provider using MyChart. We now offer e-Visits for anyone 19 and older to request care online for non-urgent symptoms. For details visit [email or web address].   Also download the MyChart app! Go to the app store, search "MyChart", open the app, select [place], and log in with your MyChart username and password.

## 2022-08-31 ENCOUNTER — Telehealth: Payer: Self-pay

## 2022-08-31 ENCOUNTER — Encounter: Payer: Self-pay | Admitting: Hematology and Oncology

## 2022-08-31 ENCOUNTER — Encounter: Payer: Self-pay | Admitting: Physician Assistant

## 2022-08-31 NOTE — Progress Notes (Signed)
[facility] [phone]   [phone]  PROGRESS NOTE  Patient Care Team: System, Provider Not In as PCP - General Fleet Contras, MD (Internal Medicine)  Hematological/Oncological History # Hodkgin's Lymphoma, Stage III 12/09/2021: CT Abdomen/Pelvis showed retroperitoneal adenopathy, with 2.1 x 3.6 cm node 12/20/2021: establish care with [facility]  12/27/2021: CT abdominal mass biopsy performed, findings consistent with classical Hodgkin's lymphoma. 01/05/2022: PET CT scan showed hypermetabolic abdominal adenopathy consistent with lymphoma.  She has mild hypermetabolism involving the mediastinal and bilateral axillary nodes. 01/17/2022: Cycle 1, Day 1 of AVD chemotherapy 01/31/2022: Cycle 1, Day 15 of AVD chemotherapy 04/24/2021: Cycle 2 Day 15 of AVD chemotherapy 05/09/2022: Cycle 3, Day 1 of AVD chemotherapy 06/06/2022: Cycle 3, Day 15 of AVD chemotherapy 06/14/2022: Mid-treatment PET/CT scan: Improved thoracoabdominal lymphadenopathy (Deauville criteria 2-3).  06/20/2022: Cycle 4, Day 1 of AVD chemotherapy HELD due to right inguinal abscess.  07/05/2022: Cycle 4, Day 1 of AVD chemotherapy  07/19/2022: Cycle 4, Day 15 of AVD chemotherapy  08/02/2022: Cycle 5, Day 1 of AVD chemotherapy  08/16/2022: Cycle 5, Day 15 of AVD chemotherapy HELD due to Hgb 6.7 08/30/2022: Cycle 5, Day 15 of AVD chemotherapy   Interval History:  Heather Petty 72 y.o. female with medical history significant for newly diagnosed Hodgkin's lymphoma who presents for a follow up visit. The patient's last visit was on 08/16/2022. She presents today for Cycle 5, Day 15 today.   On exam today Heather Petty reports her energy levels have improved since receiving a blood transfusion.  She is still sedentary and sits for majority of the day.  She reports that her appetite is good and denies any weight changes.  She reports occasional episodes of nausea without vomiting.  She denies any  abdominal pain and has occasional episodes of diarrhea.  She denies easy bruising or signs of active bleeding.  Patient still has some dryness and peeling of her hands.  She does try to apply lotion to her hands which does improve the dry skin.  She reports one episode of chest tightness that she contributes to asthma and improved after using an inhaler. She denies any fevers, chills, sweats, shortness of breath, chest pain or cough. She has no other complaints. Rest of the ROS is below.   MEDICAL HISTORY:  Past Medical History:  Diagnosis Date   AKI (acute kidney injury) (HCC) 12/17/2017   Allergic rhinitis    Anemia    Arthritis    Asthma    Autonomic neuropathy    Depression    Diabetes mellitus    Diabetes mellitus without complication (HCC)    E coli infection    GERD (gastroesophageal reflux disease)    GERD (gastroesophageal reflux disease)    Gout    Hepatitis A    High cholesterol    Hypertension    Insomnia    Kidney failure    Metabolic encephalopathy    Morbid obesity (HCC)    NASH (nonalcoholic steatohepatitis)    Pneumonia 12/17/2017   Schizophrenia (HCC)    Stroke (HCC)    Vitamin B deficiency    Vitamin D deficiency     SURGICAL HISTORY: Past Surgical History:  Procedure Laterality Date   CYST EXCISION     buttucks   DENTAL SURGERY     IR IMAGING GUIDED PORT INSERTION  01/10/2022   IR IMAGING GUIDED PORT INSERTION  04/21/2022   IR REMOVAL TUN ACCESS W/ PORT W/O FL MOD SED  02/28/2022  TONSILLECTOMY      SOCIAL HISTORY: Social History   Socioeconomic History   Marital status: Unknown    Spouse name: Not on file   Number of children: 2   Years of education: Not on file   Highest education level: Not on file  Occupational History   Occupation: retired/disabled  Tobacco Use   Smoking status: Never   Smokeless tobacco: Never  Vaping Use   Vaping status: Never Used  Substance and Sexual Activity   Alcohol use: Never   Drug use: Never   Sexual  activity: Not Currently    Birth control/protection: None  Other Topics Concern   Not on file  Social History Narrative   ** Merged History Encounter **       Social Determinants of Health   Financial Resource Strain: Not on file  Food Insecurity: No Food Insecurity (05/20/2022)   Hunger Vital Sign    Worried About Running Out of Food in the Last Year: Never true    Ran Out of Food in the Last Year: Never true  Transportation Needs: No Transportation Needs (05/20/2022)   PRAPARE - Administrator, Civil Service (Medical): No    Lack of Transportation (Non-Medical): No  Physical Activity: Not on file  Stress: Not on file  Social Connections: Not on file  Intimate Partner Violence: Not At Risk (05/20/2022)   Humiliation, Afraid, Rape, and Kick questionnaire    Fear of Current or Ex-Partner: No    Emotionally Abused: No    Physically Abused: No    Sexually Abused: No    FAMILY HISTORY: Family History  Adopted: Yes  Problem Relation Age of Onset   COPD Daughter     ALLERGIES:  is allergic to vancomycin, emend [aprepitant], fosaprepitant, lasix [furosemide], lasix [furosemide], and pineapple.  MEDICATIONS:  Current Outpatient Medications  Medication Sig Dispense Refill   acetaminophen (TYLENOL) 500 MG tablet Take 1,000 mg by mouth every 6 (six) hours as needed (for pain- CANNOT EXCEED A TOTAL OF 3,000 MG/DAY FROM ALL COMBINED SOURCES).     albuterol (VENTOLIN HFA) 108 (90 Base) MCG/ACT inhaler Inhale 2 puffs into the lungs every 4 (four) hours as needed for wheezing or shortness of breath.     allopurinol (ZYLOPRIM) 100 MG tablet Take 100 mg by mouth daily.     Amino Acids-Protein Hydrolys (PRO-STAT) LIQD Take 30 mLs by mouth 2 (two) times daily between meals.     amLODipine (NORVASC) 10 MG tablet Take 10 mg by mouth daily.     ASPERCREME LIDOCAINE 4 % Place 1 patch onto the skin See admin instructions. Apply 1 patch to both knees once a day- remove as directed      calamine lotion Apply 1 Application topically 2 (two) times daily.  0   carvedilol (COREG) 6.25 MG tablet Take 6.25 mg by mouth 2 (two) times daily with a meal.     cholecalciferol (VITAMIN D3) 25 MCG (1000 UNIT) tablet Take 2,000 Units by mouth daily.     doxepin (SINEQUAN) 25 MG capsule Take 25 mg by mouth at bedtime.     doxycycline (VIBRA-TABS) 100 MG tablet Take 1 tablet (100 mg total) by mouth 2 (two) times daily. 14 tablet 0   Emollient (EUCERIN BABY EX) Apply 1 application  topically See admin instructions. Apply to affected areas daily where the skin is peeling     famotidine (PEPCID) 20 MG tablet Take 20 mg by mouth every 12 (twelve) hours.  fluticasone (FLONASE) 50 MCG/ACT nasal spray Place 1 spray into both nostrils daily.     glipiZIDE (GLUCOTROL XL) 10 MG 24 hr tablet Take 10 mg by mouth in the morning and at bedtime.     hydrOXYzine (ATARAX/VISTARIL) 25 MG tablet Take 25 mg by mouth in the morning and at bedtime.     insulin aspart (NOVOLOG) 100 UNIT/ML injection Inject 0-12 Units into the skin See admin instructions. Inject 0-12 units into the skin 4 times a day (before meals and at bedtime) per sliding scale: BGL <60 or >400  = CALL MD; 60-200 = 0 units; 201-250 = 2 units; 251-300 = 4 units; 301-350 = 6 units; 351-400 = 8 units; 401-450 = 10 units; >450 = 12 units     ipratropium-albuterol (DUONEB) 0.5-2.5 (3) MG/3ML SOLN Take 3 mLs by nebulization 3 (three) times daily.     LANTUS SOLOSTAR 100 UNIT/ML Solostar Pen Inject 28-42 Units into the skin See admin instructions. Inject 42 units into the skin in the morning and 28 units at bedtime     lidocaine-prilocaine (EMLA) cream Apply 1 Application topically as needed. (Patient taking differently: Apply 1 Application topically See admin instructions. Apply to port site 1-2 hours prior to chemo- cover with saran wrap and secure with tape) 30 g 0   lisinopril (ZESTRIL) 5 MG tablet Take 5 mg by mouth daily.     loperamide (IMODIUM) 2 MG  capsule Take 1 capsule (2 mg total) by mouth as needed for diarrhea or loose stools. 30 capsule 0   loratadine (CLARITIN) 10 MG tablet Take 10 mg by mouth daily.     Nutritional Supplements (BOOST GLUCOSE CONTROL) LIQD Take 237 mLs by mouth 2 (two) times daily between meals.     polyethylene glycol powder (GLYCOLAX/MIRALAX) 17 GM/SCOOP powder Take 17 g by mouth daily as needed for mild constipation (to be mixed into 4-8 ounces of a beverage).     prochlorperazine (COMPAZINE) 10 MG tablet Take 1 tablet (10 mg total) by mouth every 6 (six) hours as needed for nausea or vomiting. 30 tablet 0   Simethicone (GAS-X ULTRA STRENGTH) 180 MG CAPS Take 1 capsule by mouth every 8 (eight) hours as needed (gas).     SYSTANE BALANCE 0.6 % SOLN Place 1 drop into both eyes every 12 (twelve) hours.     nystatin (MYCOSTATIN/NYSTOP) powder Apply topically 2 (two) times daily. (Patient not taking: Reported on 08/02/2022) 15 g 0   No current facility-administered medications for this visit.   Facility-Administered Medications Ordered in Other Visits  Medication Dose Route Frequency Provider Last Rate Last Admin   0.9 %  sodium chloride infusion   Intravenous Continuous Allred, Darrell K, PA-C        REVIEW OF SYSTEMS:   Constitutional: ( - ) fevers, ( - )  chills , ( - ) night sweats Eyes: ( - ) blurriness of vision, ( - ) double vision, ( - ) watery eyes Ears, nose, mouth, throat, and face: ( - ) mucositis, ( - ) sore throat Respiratory: ( - ) cough, ( - ) dyspnea, ( - ) wheezes Cardiovascular: ( - ) palpitation, ( - ) chest discomfort, ( - ) lower extremity swelling Gastrointestinal:  ( -) nausea, ( - ) heartburn, ( - ) change in bowel habits Skin: ( - ) abnormal skin rashes Lymphatics: ( - ) new lymphadenopathy, ( - ) easy bruising Neurological: ( - ) numbness, ( - ) tingling, ( - )  new weaknesses Behavioral/Psych: ( - ) mood change, ( - ) new changes  All other systems were reviewed with the patient and are  negative.  PHYSICAL EXAMINATION: ECOG PERFORMANCE STATUS: 2 - Symptomatic, <50% confined to bed  Vitals:   08/30/22 1025  BP: (!) 142/59  Pulse: 62  Resp: 18  Temp: (!) 97.2 F (36.2 C)  SpO2: 100%     Filed Weights    GENERAL: Well-appearing elderly female, alert, no distress and comfortable SKIN: skin color, texture, turgor are normal, no rashes or significant lesions. Mild erythema over port site, no discharge or tenderness to palpation. Superficial skin injury at sacrum, no open would or discharge EYES: conjunctiva are pink and non-injected, sclera clear LUNGS: clear to auscultation and percussion with normal breathing effort HEART: regular rate & rhythm and no murmurs and no lower extremity edema Musculoskeletal: no cyanosis of digits and no clubbing  PSYCH: alert & oriented x 3, fluent speech NEURO: no focal motor/sensory deficits   LABORATORY DATA:  I have reviewed the data as listed    Latest Ref Rng & Units 08/30/2022    9:55 AM 08/16/2022    8:58 AM 08/02/2022    9:11 AM  CBC  WBC 4.0 - 10.5 K/uL 9.3  8.7  10.0   Hemoglobin 12.0 - 15.0 g/dL 13.0  6.7  8.6   Hematocrit 36.0 - 46.0 % 31.4  20.9  25.8   Platelets 150 - 400 K/uL 153  135  83        Latest Ref Rng & Units 08/30/2022    9:55 AM 08/16/2022    8:58 AM 08/02/2022    9:11 AM  CMP  Glucose 70 - 99 mg/dL 93  865  784   BUN 8 - 23 mg/dL 20  24  15    Creatinine 0.44 - 1.00 mg/dL 6.96  2.95  2.84   Sodium 135 - 145 mmol/L 140  142  140   Potassium 3.5 - 5.1 mmol/L 3.9  3.7  3.4   Chloride 98 - 111 mmol/L 107  108  106   CO2 22 - 32 mmol/L 27  25  26    Calcium 8.9 - 10.3 mg/dL 9.6  9.2  9.1   Total Protein 6.5 - 8.1 g/dL 6.3  6.3  6.2   Total Bilirubin 0.3 - 1.2 mg/dL 0.4  0.4  0.4   Alkaline Phos 38 - 126 U/L 127  159  154   AST 15 - 41 U/L 15  15  15    ALT 0 - 44 U/L 16  19  18      RADIOGRAPHIC STUDIES: No results found.  ASSESSMENT & PLAN Heather Petty 72 y.o. female with medical history  significant for newly diagnosed Hodgkin's lymphoma who presents for a follow up visit.   #Hodgkin's Lymphoma, Stage III --biopsy of abdominal lymph node on 12/27/2021 showed classical Hodgkin's lymphoma.  --Pre-treatment PET scan from [facility] 5 abdominal adenopathy along with mild hypermetabolism involving mediastinal and bilateral axillary nodes. --Due to neutropenic fever, GCSF injection was added after Cycle 2, Day 15.  --Mid treatment PET scan from 06/14/2022 showed improved thoracoabdominal lymphadenopathy, Deauville score 2-3. Sleen is normal in size without focal lesion. PLAN: --today patient is due for Cycle 5, Day 15 of AVD chemotherapy. --Labs from today show white blood cell 9.3, Hgb 10.6, MCV 97.2, Plt 153. Creatinine and LFTs are normal. --Proceed with treatment today without any dose modifications.  --RTC in 2 weeks for reassessment prior  to Cycle 6 Day 1 of AVD chemotherapy.   Sacral superficial injury: --Likely secondary to pressure versus excessive moisture. No open wound seen on exam --Discussed at wound care nurse at recommend wound care at her [facility]. Suggest foam pad over the wound site and change every 2-3 days but change more frequently if wet.   #Right inguinal abscess--resolved: --Seen on PET scan from 06/14/2022 that showed 3.7 x 2.0 cm lesion along right perineum  --doxycycline therapy x 7 days with refill x 1.  -- Patient was evaluated by wound care team at her facility who confirm wound has healed and does not require any further packing.   #Port issues: --Removed original port in January 2024 and replaced it in March 2024. --Patient was recently admitted from 05/18/2022-05/24/2022 due to febrile neutropenia. Due to erythema at port site, concern was port infection. IR was consulted and advised to treat conservatively now and continue to use the access.  --Advised to try OTC benadryl or hydrocortisone cream for the pruritus.  #Sigmoid colonic focal  hypermetabolism #Hepatic steatosis with possible cirrhosis --Requested a follow up with GI to further evaluate.  #Supportive Care -- chemotherapy education complete -- port placement complete --baseline echo from 01/06/2022 showed EF 60-65% -- not planning for bleomycin, no need for PFTs.  --  compazine 10mg  PO q6H for nausea (zofran contraindicated due to QT prolongated medications).  -- allopurinol 300mg  PO daily for TLS prophylaxis -- EMLA cream for port -- no pain medication required at this time.    No orders of the defined types were placed in this encounter.   All questions were answered. The patient knows to call the clinic with any problems, questions or concerns.  I have spent a total of 30 minutes minutes of face-to-face and non-face-to-face time, preparing to see the patient, performing a medically appropriate examination, counseling and educating the patient, ordering tests/procedures, communicating with other health care professionals, documenting clinical information in the electronic health record,  and care coordination.   Georga Kaufmann PA-C Dept of Hematology and Oncology [facility] Phone: [phone]   08/31/2022 3:25 PM

## 2022-08-31 NOTE — Telephone Encounter (Signed)
Office note and labs faxed to [facility] with note emphasis on wound care information and instructions

## 2022-09-01 ENCOUNTER — Other Ambulatory Visit: Payer: Self-pay

## 2022-09-01 ENCOUNTER — Inpatient Hospital Stay: Payer: Medicare Other

## 2022-09-01 VITALS — BP 127/57 | HR 63 | Temp 98.4°F | Resp 18

## 2022-09-01 DIAGNOSIS — Z5111 Encounter for antineoplastic chemotherapy: Secondary | ICD-10-CM | POA: Diagnosis not present

## 2022-09-01 DIAGNOSIS — C8198 Hodgkin lymphoma, unspecified, lymph nodes of multiple sites: Secondary | ICD-10-CM

## 2022-09-01 MED ORDER — PEGFILGRASTIM-CBQV 6 MG/0.6ML ~~LOC~~ SOSY
6.0000 mg | PREFILLED_SYRINGE | Freq: Once | SUBCUTANEOUS | Status: AC
  Administered 2022-09-01: 6 mg via SUBCUTANEOUS
  Filled 2022-09-01: qty 0.6

## 2022-09-12 MED FILL — Dexamethasone Sodium Phosphate Inj 100 MG/10ML: INTRAMUSCULAR | Qty: 1 | Status: AC

## 2022-09-12 NOTE — Progress Notes (Unsigned)
[facility] [phone]   [phone]  PROGRESS NOTE  Patient Care Team: System, Provider Not In as PCP - General Fleet Contras, MD (Internal Medicine)  Hematological/Oncological History # Hodkgin's Lymphoma, Stage III 12/09/2021: CT Abdomen/Pelvis showed retroperitoneal adenopathy, with 2.1 x 3.6 cm node 12/20/2021: establish care with [facility]  12/27/2021: CT abdominal mass biopsy performed, findings consistent with classical Hodgkin's lymphoma. 01/05/2022: PET CT scan showed hypermetabolic abdominal adenopathy consistent with lymphoma.  She has mild hypermetabolism involving the mediastinal and bilateral axillary nodes. 01/17/2022: Cycle 1, Day 1 of AVD chemotherapy 01/31/2022: Cycle 1, Day 15 of AVD chemotherapy 04/24/2021: Cycle 2 Day 15 of AVD chemotherapy 05/09/2022: Cycle 3, Day 1 of AVD chemotherapy 06/06/2022: Cycle 3, Day 15 of AVD chemotherapy 06/14/2022: Mid-treatment PET/CT scan: Improved thoracoabdominal lymphadenopathy (Deauville criteria 2-3).  06/20/2022: Cycle 4, Day 1 of AVD chemotherapy HELD due to right inguinal abscess.  07/05/2022: Cycle 4, Day 1 of AVD chemotherapy  07/19/2022: Cycle 4, Day 15 of AVD chemotherapy  08/02/2022: Cycle 5, Day 1 of AVD chemotherapy  08/16/2022: Cycle 5, Day 15 of AVD chemotherapy HELD due to Hgb 6.7 08/30/2022: Cycle 5, Day 15 of AVD chemotherapy  09/12/2022:  Cycle 6, Day 1 of AVD chemotherapy   Interval History:  Heather Petty 72 y.o. female with medical history significant for newly diagnosed Hodgkin's lymphoma who presents for a follow up visit. The patient's last visit was on 08/30/2022. She presents today for Cycle 6, Day 1 today.   On exam today Heather Petty reports*** She denies any fevers, chills, sweats, shortness of breath, chest pain or cough. She has no other complaints. Rest of the ROS is below.   MEDICAL HISTORY:  Past Medical History:  Diagnosis Date   AKI (acute kidney injury)  (HCC) 12/17/2017   Allergic rhinitis    Anemia    Arthritis    Asthma    Autonomic neuropathy    Depression    Diabetes mellitus    Diabetes mellitus without complication (HCC)    E coli infection    GERD (gastroesophageal reflux disease)    GERD (gastroesophageal reflux disease)    Gout    Hepatitis A    High cholesterol    Hypertension    Insomnia    Kidney failure    Metabolic encephalopathy    Morbid obesity (HCC)    NASH (nonalcoholic steatohepatitis)    Pneumonia 12/17/2017   Schizophrenia (HCC)    Stroke (HCC)    Vitamin B deficiency    Vitamin D deficiency     SURGICAL HISTORY: Past Surgical History:  Procedure Laterality Date   CYST EXCISION     buttucks   DENTAL SURGERY     IR IMAGING GUIDED PORT INSERTION  01/10/2022   IR IMAGING GUIDED PORT INSERTION  04/21/2022   IR REMOVAL TUN ACCESS W/ PORT W/O FL MOD SED  02/28/2022   TONSILLECTOMY      SOCIAL HISTORY: Social History   Socioeconomic History   Marital status: Unknown    Spouse name: Not on file   Number of children: 2   Years of education: Not on file   Highest education level: Not on file  Occupational History   Occupation: retired/disabled  Tobacco Use   Smoking status: Never   Smokeless tobacco: Never  Vaping Use   Vaping status: Never Used  Substance and Sexual Activity   Alcohol use: Never   Drug use: Never   Sexual activity: Not Currently  Birth control/protection: None  Other Topics Concern   Not on file  Social History Narrative   ** Merged History Encounter **       Social Determinants of Health   Financial Resource Strain: Not on file  Food Insecurity: No Food Insecurity (05/20/2022)   Hunger Vital Sign    Worried About Running Out of Food in the Last Year: Never true    Ran Out of Food in the Last Year: Never true  Transportation Needs: No Transportation Needs (05/20/2022)   PRAPARE - Administrator, Civil Service (Medical): No    Lack of Transportation  (Non-Medical): No  Physical Activity: Not on file  Stress: Not on file  Social Connections: Not on file  Intimate Partner Violence: Not At Risk (05/20/2022)   Humiliation, Afraid, Rape, and Kick questionnaire    Fear of Current or Ex-Partner: No    Emotionally Abused: No    Physically Abused: No    Sexually Abused: No    FAMILY HISTORY: Family History  Adopted: Yes  Problem Relation Age of Onset   COPD Daughter     ALLERGIES:  is allergic to vancomycin, emend [aprepitant], fosaprepitant, lasix [furosemide], lasix [furosemide], and pineapple.  MEDICATIONS:  Current Outpatient Medications  Medication Sig Dispense Refill   acetaminophen (TYLENOL) 500 MG tablet Take 1,000 mg by mouth every 6 (six) hours as needed (for pain- CANNOT EXCEED A TOTAL OF 3,000 MG/DAY FROM ALL COMBINED SOURCES).     albuterol (VENTOLIN HFA) 108 (90 Base) MCG/ACT inhaler Inhale 2 puffs into the lungs every 4 (four) hours as needed for wheezing or shortness of breath.     allopurinol (ZYLOPRIM) 100 MG tablet Take 100 mg by mouth daily.     Amino Acids-Protein Hydrolys (PRO-STAT) LIQD Take 30 mLs by mouth 2 (two) times daily between meals.     amLODipine (NORVASC) 10 MG tablet Take 10 mg by mouth daily.     ASPERCREME LIDOCAINE 4 % Place 1 patch onto the skin See admin instructions. Apply 1 patch to both knees once a day- remove as directed     calamine lotion Apply 1 Application topically 2 (two) times daily.  0   carvedilol (COREG) 6.25 MG tablet Take 6.25 mg by mouth 2 (two) times daily with a meal.     cholecalciferol (VITAMIN D3) 25 MCG (1000 UNIT) tablet Take 2,000 Units by mouth daily.     doxepin (SINEQUAN) 25 MG capsule Take 25 mg by mouth at bedtime.     doxycycline (VIBRA-TABS) 100 MG tablet Take 1 tablet (100 mg total) by mouth 2 (two) times daily. 14 tablet 0   Emollient (EUCERIN BABY EX) Apply 1 application  topically See admin instructions. Apply to affected areas daily where the skin is peeling      famotidine (PEPCID) 20 MG tablet Take 20 mg by mouth every 12 (twelve) hours.     fluticasone (FLONASE) 50 MCG/ACT nasal spray Place 1 spray into both nostrils daily.     glipiZIDE (GLUCOTROL XL) 10 MG 24 hr tablet Take 10 mg by mouth in the morning and at bedtime.     hydrOXYzine (ATARAX/VISTARIL) 25 MG tablet Take 25 mg by mouth in the morning and at bedtime.     insulin aspart (NOVOLOG) 100 UNIT/ML injection Inject 0-12 Units into the skin See admin instructions. Inject 0-12 units into the skin 4 times a day (before meals and at bedtime) per sliding scale: BGL <60 or >400  = CALL  MD; 60-200 = 0 units; 201-250 = 2 units; 251-300 = 4 units; 301-350 = 6 units; 351-400 = 8 units; 401-450 = 10 units; >450 = 12 units     ipratropium-albuterol (DUONEB) 0.5-2.5 (3) MG/3ML SOLN Take 3 mLs by nebulization 3 (three) times daily.     LANTUS SOLOSTAR 100 UNIT/ML Solostar Pen Inject 28-42 Units into the skin See admin instructions. Inject 42 units into the skin in the morning and 28 units at bedtime     lidocaine-prilocaine (EMLA) cream Apply 1 Application topically as needed. (Patient taking differently: Apply 1 Application topically See admin instructions. Apply to port site 1-2 hours prior to chemo- cover with saran wrap and secure with tape) 30 g 0   lisinopril (ZESTRIL) 5 MG tablet Take 5 mg by mouth daily.     loperamide (IMODIUM) 2 MG capsule Take 1 capsule (2 mg total) by mouth as needed for diarrhea or loose stools. 30 capsule 0   loratadine (CLARITIN) 10 MG tablet Take 10 mg by mouth daily.     Nutritional Supplements (BOOST GLUCOSE CONTROL) LIQD Take 237 mLs by mouth 2 (two) times daily between meals.     nystatin (MYCOSTATIN/NYSTOP) powder Apply topically 2 (two) times daily. (Patient not taking: Reported on 08/02/2022) 15 g 0   polyethylene glycol powder (GLYCOLAX/MIRALAX) 17 GM/SCOOP powder Take 17 g by mouth daily as needed for mild constipation (to be mixed into 4-8 ounces of a beverage).      prochlorperazine (COMPAZINE) 10 MG tablet Take 1 tablet (10 mg total) by mouth every 6 (six) hours as needed for nausea or vomiting. 30 tablet 0   Simethicone (GAS-X ULTRA STRENGTH) 180 MG CAPS Take 1 capsule by mouth every 8 (eight) hours as needed (gas).     SYSTANE BALANCE 0.6 % SOLN Place 1 drop into both eyes every 12 (twelve) hours.     No current facility-administered medications for this visit.   Facility-Administered Medications Ordered in Other Visits  Medication Dose Route Frequency Provider Last Rate Last Admin   0.9 %  sodium chloride infusion   Intravenous Continuous Allred, Darrell K, PA-C        REVIEW OF SYSTEMS:   Constitutional: ( - ) fevers, ( - )  chills , ( - ) night sweats Eyes: ( - ) blurriness of vision, ( - ) double vision, ( - ) watery eyes Ears, nose, mouth, throat, and face: ( - ) mucositis, ( - ) sore throat Respiratory: ( - ) cough, ( - ) dyspnea, ( - ) wheezes Cardiovascular: ( - ) palpitation, ( - ) chest discomfort, ( - ) lower extremity swelling Gastrointestinal:  ( -) nausea, ( - ) heartburn, ( - ) change in bowel habits Skin: ( - ) abnormal skin rashes Lymphatics: ( - ) new lymphadenopathy, ( - ) easy bruising Neurological: ( - ) numbness, ( - ) tingling, ( - ) new weaknesses Behavioral/Psych: ( - ) mood change, ( - ) new changes  All other systems were reviewed with the patient and are negative.  PHYSICAL EXAMINATION: ECOG PERFORMANCE STATUS: 2 - Symptomatic, <50% confined to bed  There were no vitals filed for this visit.    There were no vitals filed for this visit.   GENERAL: Well-appearing elderly female, alert, no distress and comfortable SKIN: skin color, texture, turgor are normal, no rashes or significant lesions. Mild erythema over port site, no discharge or tenderness to palpation. Superficial skin injury at sacrum, no open would  or discharge EYES: conjunctiva are pink and non-injected, sclera clear LUNGS: clear to auscultation and  percussion with normal breathing effort HEART: regular rate & rhythm and no murmurs and no lower extremity edema Musculoskeletal: no cyanosis of digits and no clubbing  PSYCH: alert & oriented x 3, fluent speech NEURO: no focal motor/sensory deficits   LABORATORY DATA:  I have reviewed the data as listed    Latest Ref Rng & Units 08/30/2022    9:55 AM 08/16/2022    8:58 AM 08/02/2022    9:11 AM  CBC  WBC 4.0 - 10.5 K/uL 9.3  8.7  10.0   Hemoglobin 12.0 - 15.0 g/dL 16.1  6.7  8.6   Hematocrit 36.0 - 46.0 % 31.4  20.9  25.8   Platelets 150 - 400 K/uL 153  135  83        Latest Ref Rng & Units 08/30/2022    9:55 AM 08/16/2022    8:58 AM 08/02/2022    9:11 AM  CMP  Glucose 70 - 99 mg/dL 93  096  045   BUN 8 - 23 mg/dL 20  24  15    Creatinine 0.44 - 1.00 mg/dL 4.09  8.11  9.14   Sodium 135 - 145 mmol/L 140  142  140   Potassium 3.5 - 5.1 mmol/L 3.9  3.7  3.4   Chloride 98 - 111 mmol/L 107  108  106   CO2 22 - 32 mmol/L 27  25  26    Calcium 8.9 - 10.3 mg/dL 9.6  9.2  9.1   Total Protein 6.5 - 8.1 g/dL 6.3  6.3  6.2   Total Bilirubin 0.3 - 1.2 mg/dL 0.4  0.4  0.4   Alkaline Phos 38 - 126 U/L 127  159  154   AST 15 - 41 U/L 15  15  15    ALT 0 - 44 U/L 16  19  18      RADIOGRAPHIC STUDIES: No results found.  ASSESSMENT & PLAN Heather Petty 72 y.o. female with medical history significant for newly diagnosed Hodgkin's lymphoma who presents for a follow up visit.   #Hodgkin's Lymphoma, Stage III --biopsy of abdominal lymph node on 12/27/2021 showed classical Hodgkin's lymphoma.  --Pre-treatment PET scan from [place] 5 abdominal adenopathy along with mild hypermetabolism involving mediastinal and bilateral axillary nodes. --Due to neutropenic fever, GCSF injection was added after Cycle 2, Day 15.  --Mid treatment PET scan from 06/14/2022 showed improved thoracoabdominal lymphadenopathy, Deauville score 2-3. Sleen is normal in size without focal lesion. PLAN: --today patient is  due for Cycle 5, Day 15 of AVD chemotherapy. --Labs from today show white blood cell ***. Creatinine and LFTs are normal. --Proceed with treatment today without any dose modifications.  --RTC in 2 weeks for reassessment prior to Cycle 6 Day 15 of AVD chemotherapy.   Sacral superficial injury: --Likely secondary to pressure versus excessive moisture. No open wound seen on exam --Discussed at wound care nurse at recommend wound care at her [facility]. Suggest foam pad over the wound site and change every 2-3 days but change more frequently if wet.   #Right inguinal abscess--resolved: --Seen on PET scan from 06/14/2022 that showed 3.7 x 2.0 cm lesion along right perineum  --doxycycline therapy x 7 days with refill x 1.  -- Patient was evaluated by wound care team at her facility who confirm wound has healed and does not require any further packing.   #Port issues: --Removed original  port in January 2024 and replaced it in March 2024. --Patient was recently admitted from 05/18/2022-05/24/2022 due to febrile neutropenia. Due to erythema at port site, concern was port infection. IR was consulted and advised to treat conservatively now and continue to use the access.  --Advised to try OTC benadryl or hydrocortisone cream for the pruritus.  #Sigmoid colonic focal hypermetabolism #Hepatic steatosis with possible cirrhosis --Requested a follow up with GI to further evaluate.  #Supportive Care -- chemotherapy education complete -- port placement complete --baseline echo from 01/06/2022 showed EF 60-65% -- not planning for bleomycin, no need for PFTs.  --  compazine 10mg  PO q6H for nausea (zofran contraindicated due to QT prolongated medications).  -- allopurinol 300mg  PO daily for TLS prophylaxis -- EMLA cream for port -- no pain medication required at this time.    No orders of the defined types were placed in this encounter.   All questions were answered. The patient knows to call  the clinic with any problems, questions or concerns.  I have spent a total of 30 minutes minutes of face-to-face and non-face-to-face time, preparing to see the patient, performing a medically appropriate examination, counseling and educating the patient, ordering tests/procedures, communicating with other health care professionals, documenting clinical information in the electronic health record,  and care coordination.   Ulysees Barns, MD Department of Hematology/Oncology [facility] Phone: [phone] Pager: [phone] Email: [email or web address]    09/12/2022 11:21 PM

## 2022-09-13 ENCOUNTER — Other Ambulatory Visit: Payer: Self-pay

## 2022-09-13 ENCOUNTER — Inpatient Hospital Stay (HOSPITAL_BASED_OUTPATIENT_CLINIC_OR_DEPARTMENT_OTHER): Payer: Medicare Other | Admitting: Hematology and Oncology

## 2022-09-13 ENCOUNTER — Inpatient Hospital Stay: Payer: Medicare Other

## 2022-09-13 ENCOUNTER — Inpatient Hospital Stay: Payer: Medicare Other | Attending: Physician Assistant

## 2022-09-13 VITALS — BP 140/60 | HR 72 | Temp 98.1°F | Resp 14

## 2022-09-13 DIAGNOSIS — Z95828 Presence of other vascular implants and grafts: Secondary | ICD-10-CM | POA: Diagnosis not present

## 2022-09-13 DIAGNOSIS — C8198 Hodgkin lymphoma, unspecified, lymph nodes of multiple sites: Secondary | ICD-10-CM | POA: Diagnosis not present

## 2022-09-13 DIAGNOSIS — Z5189 Encounter for other specified aftercare: Secondary | ICD-10-CM | POA: Insufficient documentation

## 2022-09-13 DIAGNOSIS — Z5111 Encounter for antineoplastic chemotherapy: Secondary | ICD-10-CM | POA: Diagnosis present

## 2022-09-13 DIAGNOSIS — C8173 Other classical Hodgkin lymphoma, intra-abdominal lymph nodes: Secondary | ICD-10-CM | POA: Diagnosis present

## 2022-09-13 LAB — CMP (CANCER CENTER ONLY)
ALT: 13 U/L (ref 0–44)
AST: 12 U/L — ABNORMAL LOW (ref 15–41)
Albumin: 3.9 g/dL (ref 3.5–5.0)
Alkaline Phosphatase: 138 U/L — ABNORMAL HIGH (ref 38–126)
Anion gap: 6 (ref 5–15)
BUN: 17 mg/dL (ref 8–23)
CO2: 28 mmol/L (ref 22–32)
Calcium: 9.3 mg/dL (ref 8.9–10.3)
Chloride: 105 mmol/L (ref 98–111)
Creatinine: 0.89 mg/dL (ref 0.44–1.00)
GFR, Estimated: >60 mL/min (ref >60)
Glucose, Bld: 158 mg/dL — ABNORMAL HIGH (ref 70–99)
Potassium: 3.9 mmol/L (ref 3.5–5.1)
Sodium: 139 mmol/L (ref 135–145)
Total Bilirubin: 0.4 mg/dL (ref 0.3–1.2)
Total Protein: 6.5 g/dL (ref 6.5–8.1)

## 2022-09-13 LAB — CBC WITH DIFFERENTIAL (CANCER CENTER ONLY)
Abs Immature Granulocytes: 0.06 10*3/uL (ref 0.00–0.07)
Basophils Absolute: 0 10*3/uL (ref 0.0–0.1)
Basophils Relative: 0 %
Eosinophils Absolute: 0.1 10*3/uL (ref 0.0–0.5)
Eosinophils Relative: 1 %
HCT: 28.2 % — ABNORMAL LOW (ref 36.0–46.0)
Hemoglobin: 9.6 g/dL — ABNORMAL LOW (ref 12.0–15.0)
Immature Granulocytes: 1 %
Lymphocytes Relative: 25 %
Lymphs Abs: 2 10*3/uL (ref 0.7–4.0)
MCH: 33.3 pg (ref 26.0–34.0)
MCHC: 34 g/dL (ref 30.0–36.0)
MCV: 97.9 fL (ref 80.0–100.0)
Monocytes Absolute: 0.8 10*3/uL (ref 0.1–1.0)
Monocytes Relative: 10 %
Neutro Abs: 5.2 10*3/uL (ref 1.7–7.7)
Neutrophils Relative %: 63 %
Platelet Count: 109 10*3/uL — ABNORMAL LOW (ref 150–400)
RBC: 2.88 MIL/uL — ABNORMAL LOW (ref 3.87–5.11)
RDW: 18 % — ABNORMAL HIGH (ref 11.5–15.5)
WBC Count: 8.1 10*3/uL (ref 4.0–10.5)
nRBC: 0 % (ref 0.0–0.2)

## 2022-09-13 MED ORDER — PALONOSETRON HCL INJECTION 0.25 MG/5ML
0.2500 mg | Freq: Once | INTRAVENOUS | Status: AC
  Administered 2022-09-13: 0.25 mg via INTRAVENOUS
  Filled 2022-09-13: qty 5

## 2022-09-13 MED ORDER — SODIUM CHLORIDE 0.9 % IV SOLN: Freq: Once | INTRAVENOUS | Status: AC

## 2022-09-13 MED ORDER — SODIUM CHLORIDE 0.9% FLUSH
10.0000 mL | Freq: Once | INTRAVENOUS | Status: AC
  Administered 2022-09-13: 10 mL

## 2022-09-13 MED ORDER — SODIUM CHLORIDE 0.9 % IV SOLN
10.0000 mg | Freq: Once | INTRAVENOUS | Status: AC
  Administered 2022-09-13: 10 mg via INTRAVENOUS
  Filled 2022-09-13: qty 10

## 2022-09-13 MED ORDER — VINBLASTINE SULFATE CHEMO INJECTION 1 MG/ML
6.0000 mg/m2 | Freq: Once | INTRAVENOUS | Status: AC
  Administered 2022-09-13: 12.5 mg via INTRAVENOUS
  Filled 2022-09-13: qty 12.5

## 2022-09-13 MED ORDER — FAMOTIDINE IN NACL 20-0.9 MG/50ML-% IV SOLN
20.0000 mg | Freq: Once | INTRAVENOUS | Status: AC
  Administered 2022-09-13: 20 mg via INTRAVENOUS
  Filled 2022-09-13: qty 50

## 2022-09-13 MED ORDER — HEPARIN SOD (PORK) LOCK FLUSH 100 UNIT/ML IV SOLN
500.0000 [IU] | Freq: Once | INTRAVENOUS | Status: AC | PRN
  Administered 2022-09-13: 500 [IU]

## 2022-09-13 MED ORDER — SODIUM CHLORIDE 0.9% FLUSH
10.0000 mL | INTRAVENOUS | Status: DC | PRN
  Administered 2022-09-13: 10 mL

## 2022-09-13 MED ORDER — DOXORUBICIN HCL CHEMO IV INJECTION 2 MG/ML
25.0000 mg/m2 | Freq: Once | INTRAVENOUS | Status: AC
  Administered 2022-09-13: 52 mg via INTRAVENOUS
  Filled 2022-09-13: qty 26

## 2022-09-13 MED ORDER — SODIUM CHLORIDE 0.9 % IV SOLN
375.0000 mg/m2 | Freq: Once | INTRAVENOUS | Status: AC
  Administered 2022-09-13: 800 mg via INTRAVENOUS
  Filled 2022-09-13: qty 80

## 2022-09-15 ENCOUNTER — Inpatient Hospital Stay: Payer: Medicare Other

## 2022-09-15 ENCOUNTER — Other Ambulatory Visit: Payer: Self-pay

## 2022-09-15 VITALS — BP 115/58 | HR 77 | Temp 98.6°F | Resp 16

## 2022-09-15 DIAGNOSIS — C8198 Hodgkin lymphoma, unspecified, lymph nodes of multiple sites: Secondary | ICD-10-CM

## 2022-09-15 DIAGNOSIS — Z5111 Encounter for antineoplastic chemotherapy: Secondary | ICD-10-CM | POA: Diagnosis not present

## 2022-09-15 MED ORDER — PEGFILGRASTIM-CBQV 6 MG/0.6ML ~~LOC~~ SOSY
6.0000 mg | PREFILLED_SYRINGE | Freq: Once | SUBCUTANEOUS | Status: AC
  Administered 2022-09-15: 6 mg via SUBCUTANEOUS
  Filled 2022-09-15: qty 0.6

## 2022-09-25 ENCOUNTER — Other Ambulatory Visit: Payer: Self-pay | Admitting: Hematology and Oncology

## 2022-09-25 MED FILL — Dexamethasone Sodium Phosphate Inj 100 MG/10ML: INTRAMUSCULAR | Qty: 1 | Status: AC

## 2022-09-26 ENCOUNTER — Inpatient Hospital Stay: Payer: Medicare Other

## 2022-09-26 ENCOUNTER — Inpatient Hospital Stay: Payer: Medicare Other | Admitting: Hematology and Oncology

## 2022-09-26 ENCOUNTER — Other Ambulatory Visit: Payer: Self-pay | Admitting: *Deleted

## 2022-09-26 VITALS — BP 135/77 | HR 71 | Temp 97.9°F | Resp 14

## 2022-09-26 DIAGNOSIS — C8198 Hodgkin lymphoma, unspecified, lymph nodes of multiple sites: Secondary | ICD-10-CM | POA: Diagnosis not present

## 2022-09-26 DIAGNOSIS — Z95828 Presence of other vascular implants and grafts: Secondary | ICD-10-CM

## 2022-09-26 DIAGNOSIS — Z5111 Encounter for antineoplastic chemotherapy: Secondary | ICD-10-CM | POA: Diagnosis not present

## 2022-09-26 LAB — CMP (CANCER CENTER ONLY)
ALT: 17 U/L (ref 0–44)
AST: 14 U/L — ABNORMAL LOW (ref 15–41)
Albumin: 4 g/dL (ref 3.5–5.0)
Alkaline Phosphatase: 138 U/L — ABNORMAL HIGH (ref 38–126)
Anion gap: 7 (ref 5–15)
BUN: 22 mg/dL (ref 8–23)
CO2: 27 mmol/L (ref 22–32)
Calcium: 9.5 mg/dL (ref 8.9–10.3)
Chloride: 108 mmol/L (ref 98–111)
Creatinine: 0.89 mg/dL (ref 0.44–1.00)
GFR, Estimated: >60 mL/min (ref >60)
Glucose, Bld: 116 mg/dL — ABNORMAL HIGH (ref 70–99)
Potassium: 3.7 mmol/L (ref 3.5–5.1)
Sodium: 142 mmol/L (ref 135–145)
Total Bilirubin: 0.3 mg/dL (ref 0.3–1.2)
Total Protein: 6.6 g/dL (ref 6.5–8.1)

## 2022-09-26 LAB — CBC WITH DIFFERENTIAL (CANCER CENTER ONLY)
Abs Immature Granulocytes: 0.15 10*3/uL — ABNORMAL HIGH (ref 0.00–0.07)
Basophils Absolute: 0 10*3/uL (ref 0.0–0.1)
Basophils Relative: 0 %
Eosinophils Absolute: 0 10*3/uL (ref 0.0–0.5)
Eosinophils Relative: 0 %
HCT: 23.8 % — ABNORMAL LOW (ref 36.0–46.0)
Hemoglobin: 7.9 g/dL — ABNORMAL LOW (ref 12.0–15.0)
Immature Granulocytes: 2 %
Lymphocytes Relative: 28 %
Lymphs Abs: 2 10*3/uL (ref 0.7–4.0)
MCH: 32.8 pg (ref 26.0–34.0)
MCHC: 33.2 g/dL (ref 30.0–36.0)
MCV: 98.8 fL (ref 80.0–100.0)
Monocytes Absolute: 1 10*3/uL (ref 0.1–1.0)
Monocytes Relative: 14 %
Neutro Abs: 4 10*3/uL (ref 1.7–7.7)
Neutrophils Relative %: 56 %
Platelet Count: 66 10*3/uL — ABNORMAL LOW (ref 150–400)
RBC: 2.41 MIL/uL — ABNORMAL LOW (ref 3.87–5.11)
RDW: 17 % — ABNORMAL HIGH (ref 11.5–15.5)
WBC Count: 7.2 10*3/uL (ref 4.0–10.5)
nRBC: 0.8 % — ABNORMAL HIGH (ref 0.0–0.2)

## 2022-09-26 MED ORDER — SODIUM CHLORIDE 0.9% FLUSH
10.0000 mL | Freq: Once | INTRAVENOUS | Status: AC
  Administered 2022-09-26: 10 mL

## 2022-09-26 MED ORDER — HEPARIN SOD (PORK) LOCK FLUSH 100 UNIT/ML IV SOLN
500.0000 [IU] | Freq: Once | INTRAVENOUS | Status: AC
  Administered 2022-09-26: 500 [IU] via INTRAVENOUS

## 2022-09-26 MED ORDER — SODIUM CHLORIDE 0.9% FLUSH
10.0000 mL | INTRAVENOUS | Status: DC | PRN
  Administered 2022-09-26: 10 mL via INTRAVENOUS

## 2022-09-26 NOTE — Progress Notes (Signed)
[facility] Telephone:([phone]   [phone]  PROGRESS NOTE  Patient Care Team: System, Provider Not In as PCP - General Fleet Contras, MD (Internal Medicine)  Hematological/Oncological History # Hodkgin's Lymphoma, Stage III 12/09/2021: CT Abdomen/Pelvis showed retroperitoneal adenopathy, with 2.1 x 3.6 cm node 12/20/2021: establish care with [facility]  12/27/2021: CT abdominal mass biopsy performed, findings consistent with classical Hodgkin's lymphoma. 01/05/2022: PET CT scan showed hypermetabolic abdominal adenopathy consistent with lymphoma.  She has mild hypermetabolism involving the mediastinal and bilateral axillary nodes. 01/17/2022: Cycle 1, Day 1 of AVD chemotherapy 01/31/2022: Cycle 1, Day 15 of AVD chemotherapy 04/24/2021: Cycle 2 Day 15 of AVD chemotherapy 05/09/2022: Cycle 3, Day 1 of AVD chemotherapy 06/06/2022: Cycle 3, Day 15 of AVD chemotherapy 06/14/2022: Mid-treatment PET/CT scan: Improved thoracoabdominal lymphadenopathy (Deauville criteria 2-3).  06/20/2022: Cycle 4, Day 1 of AVD chemotherapy HELD due to right inguinal abscess.  07/05/2022: Cycle 4, Day 1 of AVD chemotherapy  07/19/2022: Cycle 4, Day 15 of AVD chemotherapy  08/02/2022: Cycle 5, Day 1 of AVD chemotherapy  08/16/2022: Cycle 5, Day 15 of AVD chemotherapy HELD due to Hgb 6.7 08/30/2022: Cycle 5, Day 15 of AVD chemotherapy  09/12/2022:  Cycle 6, Day 1 of AVD chemotherapy  09/26/2022: Chemo HELD due to cytopenias.   Interval History:  Heather Petty 72 y.o. female with medical history significant for newly diagnosed Hodgkin's lymphoma who presents for a follow up visit. The patient's last visit was on 09/12/2022. She presents today for Cycle 6, Day 15 today.   On exam today Heather Petty reports she developed a lot of sores in her mouth after the last treatment.  She is also been having issues with fatigue.  She notes that she did have a little bit of blood on the toilet seat at 1  point in time but no overt blood in the stool or urine.  She is not bleeding from anywhere else.  She notes that she lost maybe about a half a tablespoon of blood on the toilet seat.  She reports she is not having any infectious symptoms such as runny nose, sore throat, or cough.  She is disheartened by the fact that her blood counts are good enough today to proceed with chemotherapy.  She voices understanding and reports that she is willing and able to proceed with her final dose in 2 weeks.  She denies any fevers, chills, sweats, shortness of breath, chest pain or cough. She has no other complaints. Rest of the ROS is below.   MEDICAL HISTORY:  Past Medical History:  Diagnosis Date   AKI (acute kidney injury) (HCC) 12/17/2017   Allergic rhinitis    Anemia    Arthritis    Asthma    Autonomic neuropathy    Depression    Diabetes mellitus    Diabetes mellitus without complication (HCC)    E coli infection    GERD (gastroesophageal reflux disease)    GERD (gastroesophageal reflux disease)    Gout    Hepatitis A    High cholesterol    Hypertension    Insomnia    Kidney failure    Metabolic encephalopathy    Morbid obesity (HCC)    NASH (nonalcoholic steatohepatitis)    Pneumonia 12/17/2017   Schizophrenia (HCC)    Stroke (HCC)    Vitamin B deficiency    Vitamin D deficiency     SURGICAL HISTORY: Past Surgical History:  Procedure Laterality Date   CYST EXCISION  buttucks   DENTAL SURGERY     IR IMAGING GUIDED PORT INSERTION  01/10/2022   IR IMAGING GUIDED PORT INSERTION  04/21/2022   IR REMOVAL TUN ACCESS W/ PORT W/O FL MOD SED  02/28/2022   TONSILLECTOMY      SOCIAL HISTORY: Social History   Socioeconomic History   Marital status: Unknown    Spouse name: Not on file   Number of children: 2   Years of education: Not on file   Highest education level: Not on file  Occupational History   Occupation: retired/disabled  Tobacco Use   Smoking status: Never   Smokeless  tobacco: Never  Vaping Use   Vaping status: Never Used  Substance and Sexual Activity   Alcohol use: Never   Drug use: Never   Sexual activity: Not Currently    Birth control/protection: None  Other Topics Concern   Not on file  Social History Narrative   ** Merged History Encounter **       Social Determinants of Health   Financial Resource Strain: Not on file  Food Insecurity: No Food Insecurity (05/20/2022)   Hunger Vital Sign    Worried About Running Out of Food in the Last Year: Never true    Ran Out of Food in the Last Year: Never true  Transportation Needs: No Transportation Needs (05/20/2022)   PRAPARE - Administrator, Civil Service (Medical): No    Lack of Transportation (Non-Medical): No  Physical Activity: Not on file  Stress: Not on file  Social Connections: Not on file  Intimate Partner Violence: Not At Risk (05/20/2022)   Humiliation, Afraid, Rape, and Kick questionnaire    Fear of Current or Ex-Partner: No    Emotionally Abused: No    Physically Abused: No    Sexually Abused: No    FAMILY HISTORY: Family History  Adopted: Yes  Problem Relation Age of Onset   COPD Daughter     ALLERGIES:  is allergic to vancomycin, emend [aprepitant], fosaprepitant, lasix [furosemide], lasix [furosemide], and pineapple.  MEDICATIONS:  Current Outpatient Medications  Medication Sig Dispense Refill   acetaminophen (TYLENOL) 500 MG tablet Take 1,000 mg by mouth every 6 (six) hours as needed (for pain- CANNOT EXCEED A TOTAL OF 3,000 MG/DAY FROM ALL COMBINED SOURCES).     albuterol (VENTOLIN HFA) 108 (90 Base) MCG/ACT inhaler Inhale 2 puffs into the lungs every 4 (four) hours as needed for wheezing or shortness of breath.     allopurinol (ZYLOPRIM) 100 MG tablet Take 100 mg by mouth daily.     Amino Acids-Protein Hydrolys (PRO-STAT) LIQD Take 30 mLs by mouth 2 (two) times daily between meals.     amLODipine (NORVASC) 10 MG tablet Take 10 mg by mouth daily.      ASPERCREME LIDOCAINE 4 % Place 1 patch onto the skin See admin instructions. Apply 1 patch to both knees once a day- remove as directed     calamine lotion Apply 1 Application topically 2 (two) times daily.  0   carvedilol (COREG) 6.25 MG tablet Take 6.25 mg by mouth 2 (two) times daily with a meal.     cholecalciferol (VITAMIN D3) 25 MCG (1000 UNIT) tablet Take 2,000 Units by mouth daily.     doxepin (SINEQUAN) 25 MG capsule Take 25 mg by mouth at bedtime.     doxycycline (VIBRA-TABS) 100 MG tablet Take 1 tablet (100 mg total) by mouth 2 (two) times daily. 14 tablet 0   Emollient (  EUCERIN BABY EX) Apply 1 application  topically See admin instructions. Apply to affected areas daily where the skin is peeling     famotidine (PEPCID) 20 MG tablet Take 20 mg by mouth every 12 (twelve) hours.     fluticasone (FLONASE) 50 MCG/ACT nasal spray Place 1 spray into both nostrils daily.     glipiZIDE (GLUCOTROL XL) 10 MG 24 hr tablet Take 10 mg by mouth in the morning and at bedtime.     hydrOXYzine (ATARAX/VISTARIL) 25 MG tablet Take 25 mg by mouth in the morning and at bedtime.     insulin aspart (NOVOLOG) 100 UNIT/ML injection Inject 0-12 Units into the skin See admin instructions. Inject 0-12 units into the skin 4 times a day (before meals and at bedtime) per sliding scale: BGL <60 or >400  = CALL MD; 60-200 = 0 units; 201-250 = 2 units; 251-300 = 4 units; 301-350 = 6 units; 351-400 = 8 units; 401-450 = 10 units; >450 = 12 units     ipratropium-albuterol (DUONEB) 0.5-2.5 (3) MG/3ML SOLN Take 3 mLs by nebulization 3 (three) times daily.     LANTUS SOLOSTAR 100 UNIT/ML Solostar Pen Inject 28-42 Units into the skin See admin instructions. Inject 42 units into the skin in the morning and 28 units at bedtime     lidocaine-prilocaine (EMLA) cream Apply 1 Application topically as needed. (Patient taking differently: Apply 1 Application topically See admin instructions. Apply to port site 1-2 hours prior to chemo-  cover with saran wrap and secure with tape) 30 g 0   lisinopril (ZESTRIL) 5 MG tablet Take 5 mg by mouth daily.     loperamide (IMODIUM) 2 MG capsule Take 1 capsule (2 mg total) by mouth as needed for diarrhea or loose stools. 30 capsule 0   loratadine (CLARITIN) 10 MG tablet Take 10 mg by mouth daily.     Nutritional Supplements (BOOST GLUCOSE CONTROL) LIQD Take 237 mLs by mouth 2 (two) times daily between meals.     nystatin (MYCOSTATIN/NYSTOP) powder Apply topically 2 (two) times daily. (Patient not taking: Reported on 08/02/2022) 15 g 0   polyethylene glycol powder (GLYCOLAX/MIRALAX) 17 GM/SCOOP powder Take 17 g by mouth daily as needed for mild constipation (to be mixed into 4-8 ounces of a beverage).     prochlorperazine (COMPAZINE) 10 MG tablet Take 1 tablet (10 mg total) by mouth every 6 (six) hours as needed for nausea or vomiting. 30 tablet 0   Simethicone (GAS-X ULTRA STRENGTH) 180 MG CAPS Take 1 capsule by mouth every 8 (eight) hours as needed (gas).     SYSTANE BALANCE 0.6 % SOLN Place 1 drop into both eyes every 12 (twelve) hours.     Current Facility-Administered Medications  Medication Dose Route Frequency Provider Last Rate Last Admin   sodium chloride flush (NS) 0.9 % injection 10 mL  10 mL Intravenous PRN Jaci Standard, MD   10 mL at 09/26/22 1052   Facility-Administered Medications Ordered in Other Visits  Medication Dose Route Frequency Provider Last Rate Last Admin   0.9 %  sodium chloride infusion   Intravenous Continuous Allred, Darrell K, PA-C        REVIEW OF SYSTEMS:   Constitutional: ( - ) fevers, ( - )  chills , ( - ) night sweats Eyes: ( - ) blurriness of vision, ( - ) double vision, ( - ) watery eyes Ears, nose, mouth, throat, and face: ( - ) mucositis, ( - )  sore throat Respiratory: ( - ) cough, ( - ) dyspnea, ( - ) wheezes Cardiovascular: ( - ) palpitation, ( - ) chest discomfort, ( - ) lower extremity swelling Gastrointestinal:  ( -) nausea, ( - )  heartburn, ( - ) change in bowel habits Skin: ( - ) abnormal skin rashes Lymphatics: ( - ) new lymphadenopathy, ( - ) easy bruising Neurological: ( - ) numbness, ( - ) tingling, ( - ) new weaknesses Behavioral/Psych: ( - ) mood change, ( - ) new changes  All other systems were reviewed with the patient and are negative.  PHYSICAL EXAMINATION: ECOG PERFORMANCE STATUS: 2 - Symptomatic, <50% confined to bed  Vitals:   09/26/22 0942  BP: 135/77  Pulse: 71  Resp: 14  Temp: 97.9 F (36.6 C)  SpO2: 100%      There were no vitals filed for this visit.   GENERAL: Well-appearing elderly female, alert, no distress and comfortable SKIN: skin color, texture, turgor are normal, no rashes or significant lesions. Mild erythema over port site, no discharge or tenderness to palpation. EYES: conjunctiva are pink and non-injected, sclera clear LUNGS: clear to auscultation and percussion with normal breathing effort HEART: regular rate & rhythm and no murmurs and no lower extremity edema Musculoskeletal: no cyanosis of digits and no clubbing  PSYCH: alert & oriented x 3, fluent speech NEURO: no focal motor/sensory deficits   LABORATORY DATA:  I have reviewed the data as listed    Latest Ref Rng & Units 09/26/2022    9:23 AM 09/13/2022   10:02 AM 08/30/2022    9:55 AM  CBC  WBC 4.0 - 10.5 K/uL 7.2  8.1  9.3   Hemoglobin 12.0 - 15.0 g/dL 7.9  9.6  09.8   Hematocrit 36.0 - 46.0 % 23.8  28.2  31.4   Platelets 150 - 400 K/uL 66  109  153        Latest Ref Rng & Units 09/26/2022    9:23 AM 09/13/2022   10:02 AM 08/30/2022    9:55 AM  CMP  Glucose 70 - 99 mg/dL 119  147  93   BUN 8 - 23 mg/dL 22  17  20    Creatinine 0.44 - 1.00 mg/dL 8.29  5.62  1.30   Sodium 135 - 145 mmol/L 142  139  140   Potassium 3.5 - 5.1 mmol/L 3.7  3.9  3.9   Chloride 98 - 111 mmol/L 108  105  107   CO2 22 - 32 mmol/L 27  28  27    Calcium 8.9 - 10.3 mg/dL 9.5  9.3  9.6   Total Protein 6.5 - 8.1 g/dL 6.6  6.5  6.3    Total Bilirubin 0.3 - 1.2 mg/dL 0.3  0.4  0.4   Alkaline Phos 38 - 126 U/L 138  138  127   AST 15 - 41 U/L 14  12  15    ALT 0 - 44 U/L 17  13  16      RADIOGRAPHIC STUDIES: No results found.  ASSESSMENT & PLAN Heather Petty 72 y.o. female with medical history significant for newly diagnosed Hodgkin's lymphoma who presents for a follow up visit.   #Hodgkin's Lymphoma, Stage III --biopsy of abdominal lymph node on 12/27/2021 showed classical Hodgkin's lymphoma.  --Pre-treatment PET scan from [place] 5 abdominal adenopathy along with mild hypermetabolism involving mediastinal and bilateral axillary nodes. --Due to neutropenic fever, GCSF injection was added after Cycle 2, Day 15.  --  Mid treatment PET scan from 06/14/2022 showed improved thoracoabdominal lymphadenopathy, Deauville score 2-3. Sleen is normal in size without focal lesion. PLAN: --today patient is due for Cycle 6, Day 15 of AVD chemotherapy, however will need to HOLD due to cytopenias (Hgb 7.9, Plt 66).  --Labs from today show white blood cell 7.2, hemoglobin 7.9, platelets of 66. Creatinine and LFTs are normal. --RTC in 2 weeks for completing Cycle 6 of chemotherapy.   Sacral superficial injury: --Likely secondary to pressure versus excessive moisture. No open wound seen on exam --Discussed at wound care nurse at recommend wound care at her [facility]. Suggest foam pad over the wound site and change every 2-3 days but change more frequently if wet.   #Right inguinal abscess--resolved: --Seen on PET scan from 06/14/2022 that showed 3.7 x 2.0 cm lesion along right perineum  --doxycycline therapy x 7 days with refill x 1.  -- Patient was evaluated by wound care team at her facility who confirm wound has healed and does not require any further packing.   #Port issues: --Removed original port in January 2024 and replaced it in March 2024. --Patient was recently admitted from 05/18/2022-05/24/2022 due to febrile  neutropenia. Due to erythema at port site, concern was port infection. IR was consulted and advised to treat conservatively now and continue to use the access.  --Advised to try OTC benadryl or hydrocortisone cream for the pruritus.  #Sigmoid colonic focal hypermetabolism #Hepatic steatosis with possible cirrhosis --Requested a follow up with GI to further evaluate.  #Supportive Care -- chemotherapy education complete -- port placement complete --baseline echo from 01/06/2022 showed EF 60-65% -- not planning for bleomycin, no need for PFTs.  --  compazine 10mg  PO q6H for nausea (zofran contraindicated due to QT prolongated medications).  -- allopurinol 300mg  PO daily for TLS prophylaxis -- EMLA cream for port -- no pain medication required at this time.    No orders of the defined types were placed in this encounter.   All questions were answered. The patient knows to call the clinic with any problems, questions or concerns.  I have spent a total of 30 minutes minutes of face-to-face and non-face-to-face time, preparing to see the patient, performing a medically appropriate examination, counseling and educating the patient, ordering tests/procedures, communicating with other health care professionals, documenting clinical information in the electronic health record,  and care coordination.   Ulysees Barns, MD Department of Hematology/Oncology [facility] Phone: [phone] Pager: [phone] Email: [email or web address]  09/26/2022 11:17 AM

## 2022-09-27 ENCOUNTER — Other Ambulatory Visit: Payer: Medicare Other

## 2022-09-27 ENCOUNTER — Ambulatory Visit: Payer: Medicare Other

## 2022-09-27 ENCOUNTER — Ambulatory Visit: Payer: Medicare Other | Admitting: Physician Assistant

## 2022-09-27 ENCOUNTER — Other Ambulatory Visit: Payer: Self-pay

## 2022-09-28 ENCOUNTER — Ambulatory Visit: Payer: Medicare Other

## 2022-09-29 ENCOUNTER — Ambulatory Visit: Payer: Medicare Other

## 2022-10-10 ENCOUNTER — Other Ambulatory Visit: Payer: Self-pay

## 2022-10-10 DIAGNOSIS — D539 Nutritional anemia, unspecified: Secondary | ICD-10-CM

## 2022-10-10 DIAGNOSIS — C8198 Hodgkin lymphoma, unspecified, lymph nodes of multiple sites: Secondary | ICD-10-CM

## 2022-10-10 MED FILL — Dexamethasone Sodium Phosphate Inj 100 MG/10ML: INTRAMUSCULAR | Qty: 1 | Status: AC

## 2022-10-11 ENCOUNTER — Other Ambulatory Visit: Payer: Self-pay | Admitting: Physician Assistant

## 2022-10-11 ENCOUNTER — Inpatient Hospital Stay (HOSPITAL_BASED_OUTPATIENT_CLINIC_OR_DEPARTMENT_OTHER): Payer: Medicare Other | Admitting: Physician Assistant

## 2022-10-11 ENCOUNTER — Inpatient Hospital Stay: Payer: Medicare Other

## 2022-10-11 ENCOUNTER — Inpatient Hospital Stay: Payer: Medicare Other | Attending: Physician Assistant

## 2022-10-11 VITALS — BP 162/67 | HR 88 | Resp 18 | Wt 209.0 lb

## 2022-10-11 VITALS — BP 150/68 | HR 69 | Temp 98.0°F | Resp 17 | Ht 62.0 in

## 2022-10-11 DIAGNOSIS — D539 Nutritional anemia, unspecified: Secondary | ICD-10-CM

## 2022-10-11 DIAGNOSIS — Z95828 Presence of other vascular implants and grafts: Secondary | ICD-10-CM

## 2022-10-11 DIAGNOSIS — Z5111 Encounter for antineoplastic chemotherapy: Secondary | ICD-10-CM | POA: Diagnosis present

## 2022-10-11 DIAGNOSIS — C8198 Hodgkin lymphoma, unspecified, lymph nodes of multiple sites: Secondary | ICD-10-CM

## 2022-10-11 DIAGNOSIS — C8173 Other classical Hodgkin lymphoma, intra-abdominal lymph nodes: Secondary | ICD-10-CM | POA: Insufficient documentation

## 2022-10-11 DIAGNOSIS — Z5189 Encounter for other specified aftercare: Secondary | ICD-10-CM | POA: Insufficient documentation

## 2022-10-11 LAB — SAMPLE TO BLOOD BANK

## 2022-10-11 LAB — CBC WITH DIFFERENTIAL (CANCER CENTER ONLY)
Abs Immature Granulocytes: 0.02 10*3/uL (ref 0.00–0.07)
Basophils Absolute: 0 10*3/uL (ref 0.0–0.1)
Basophils Relative: 0 %
Eosinophils Absolute: 0.1 10*3/uL (ref 0.0–0.5)
Eosinophils Relative: 1 %
HCT: 28.8 % — ABNORMAL LOW (ref 36.0–46.0)
Hemoglobin: 9.5 g/dL — ABNORMAL LOW (ref 12.0–15.0)
Immature Granulocytes: 0 %
Lymphocytes Relative: 22 %
Lymphs Abs: 1.5 10*3/uL (ref 0.7–4.0)
MCH: 33.6 pg (ref 26.0–34.0)
MCHC: 33 g/dL (ref 30.0–36.0)
MCV: 101.8 fL — ABNORMAL HIGH (ref 80.0–100.0)
Monocytes Absolute: 0.6 10*3/uL (ref 0.1–1.0)
Monocytes Relative: 8 %
Neutro Abs: 4.8 10*3/uL (ref 1.7–7.7)
Neutrophils Relative %: 69 %
Platelet Count: 163 10*3/uL (ref 150–400)
RBC: 2.83 MIL/uL — ABNORMAL LOW (ref 3.87–5.11)
RDW: 18.4 % — ABNORMAL HIGH (ref 11.5–15.5)
WBC Count: 7 10*3/uL (ref 4.0–10.5)
nRBC: 0 % (ref 0.0–0.2)

## 2022-10-11 LAB — CMP (CANCER CENTER ONLY)
ALT: 15 U/L (ref 0–44)
AST: 15 U/L (ref 15–41)
Albumin: 4.1 g/dL (ref 3.5–5.0)
Alkaline Phosphatase: 130 U/L — ABNORMAL HIGH (ref 38–126)
Anion gap: 7 (ref 5–15)
BUN: 22 mg/dL (ref 8–23)
CO2: 27 mmol/L (ref 22–32)
Calcium: 9.6 mg/dL (ref 8.9–10.3)
Chloride: 108 mmol/L (ref 98–111)
Creatinine: 0.76 mg/dL (ref 0.44–1.00)
GFR, Estimated: >60 mL/min (ref >60)
Glucose, Bld: 91 mg/dL (ref 70–99)
Potassium: 3.9 mmol/L (ref 3.5–5.1)
Sodium: 142 mmol/L (ref 135–145)
Total Bilirubin: 0.4 mg/dL (ref 0.3–1.2)
Total Protein: 6.7 g/dL (ref 6.5–8.1)

## 2022-10-11 MED ORDER — SODIUM CHLORIDE 0.9 % IV SOLN
375.0000 mg/m2 | Freq: Once | INTRAVENOUS | Status: AC
  Administered 2022-10-11: 800 mg via INTRAVENOUS
  Filled 2022-10-11: qty 80

## 2022-10-11 MED ORDER — SODIUM CHLORIDE 0.9 % IV SOLN
10.0000 mg | Freq: Once | INTRAVENOUS | Status: AC
  Administered 2022-10-11: 10 mg via INTRAVENOUS
  Filled 2022-10-11: qty 10

## 2022-10-11 MED ORDER — HEPARIN SOD (PORK) LOCK FLUSH 100 UNIT/ML IV SOLN
500.0000 [IU] | Freq: Once | INTRAVENOUS | Status: AC | PRN
  Administered 2022-10-11: 500 [IU]

## 2022-10-11 MED ORDER — SODIUM CHLORIDE 0.9% FLUSH
10.0000 mL | Freq: Once | INTRAVENOUS | Status: AC
  Administered 2022-10-11: 10 mL

## 2022-10-11 MED ORDER — DOXORUBICIN HCL CHEMO IV INJECTION 2 MG/ML
25.0000 mg/m2 | Freq: Once | INTRAVENOUS | Status: AC
  Administered 2022-10-11: 52 mg via INTRAVENOUS
  Filled 2022-10-11: qty 26

## 2022-10-11 MED ORDER — PALONOSETRON HCL INJECTION 0.25 MG/5ML
0.2500 mg | Freq: Once | INTRAVENOUS | Status: AC
  Administered 2022-10-11: 0.25 mg via INTRAVENOUS
  Filled 2022-10-11: qty 5

## 2022-10-11 MED ORDER — SODIUM CHLORIDE 0.9% FLUSH
10.0000 mL | INTRAVENOUS | Status: DC | PRN
  Administered 2022-10-11: 10 mL

## 2022-10-11 MED ORDER — SODIUM CHLORIDE 0.9 % IV SOLN: Freq: Once | INTRAVENOUS | Status: AC

## 2022-10-11 MED ORDER — VINBLASTINE SULFATE CHEMO INJECTION 1 MG/ML
6.0000 mg/m2 | Freq: Once | INTRAVENOUS | Status: AC
  Administered 2022-10-11: 12.5 mg via INTRAVENOUS
  Filled 2022-10-11: qty 12.5

## 2022-10-11 MED ORDER — FAMOTIDINE IN NACL 20-0.9 MG/50ML-% IV SOLN
20.0000 mg | Freq: Once | INTRAVENOUS | Status: AC
  Administered 2022-10-11: 20 mg via INTRAVENOUS
  Filled 2022-10-11: qty 50

## 2022-10-11 NOTE — Progress Notes (Signed)
[facility] [phone]   [phone]  PROGRESS NOTE  Patient Care Team: System, Provider Not In as PCP - General Fleet Contras, MD (Internal Medicine)  Hematological/Oncological History # Hodkgin's Lymphoma, Stage III 12/09/2021: CT Abdomen/Pelvis showed retroperitoneal adenopathy, with 2.1 x 3.6 cm node 12/20/2021: establish care with [facility]  12/27/2021: CT abdominal mass biopsy performed, findings consistent with classical Hodgkin's lymphoma. 01/05/2022: PET CT scan showed hypermetabolic abdominal adenopathy consistent with lymphoma.  She has mild hypermetabolism involving the mediastinal and bilateral axillary nodes. 01/17/2022: Cycle 1, Day 1 of AVD chemotherapy 01/31/2022: Cycle 1, Day 15 of AVD chemotherapy 04/24/2021: Cycle 2 Day 15 of AVD chemotherapy 05/09/2022: Cycle 3, Day 1 of AVD chemotherapy 06/06/2022: Cycle 3, Day 15 of AVD chemotherapy 06/14/2022: Mid-treatment PET/CT scan: Improved thoracoabdominal lymphadenopathy (Deauville criteria 2-3).  06/20/2022: Cycle 4, Day 1 of AVD chemotherapy HELD due to right inguinal abscess.  07/05/2022: Cycle 4, Day 1 of AVD chemotherapy  07/19/2022: Cycle 4, Day 15 of AVD chemotherapy  08/02/2022: Cycle 5, Day 1 of AVD chemotherapy  08/16/2022: Cycle 5, Day 15 of AVD chemotherapy HELD due to Hgb 6.7 08/30/2022: Cycle 5, Day 15 of AVD chemotherapy  09/13/2022: Cycle 6, Day 1 of AVD chemotherapy 09/26/2022: Cycle 6, Day 15 of AVD chemotherapy HELD due to Hgb 7.9, Plt 66K 10/11/2022: Cycle 6, Day 15 of AVD chemotherapy  Interval History:  Heather Petty 72 y.o. female with medical history significant for newly diagnosed Hodgkin's lymphoma who presents for a follow up visit. The patient's last visit was on 09/26/2022. She presents today for Cycle 6, Day 15 today.   On exam today Heather Petty reports she is having a rough morning at her facility because she didn't get an adequate breakfast. She does have  some fatigue but overall stable while on chemotherapy. Her appetite is stable without any noticeable weight changes. She reports intermittent episodes of nausea without vomiting. She denies abdominal pain and bowel habits are stable with occasional episodes of diarrhea. She takes antidiarrheals with improvement of symptoms. She denies easy bruising or signs of bleeding. She reports occasional episode of shortness of breath secondary to chronic asthma. She denies any fevers, chills, sweats, chest pain or cough. She has no other complaints. Rest of the ROS is below.   MEDICAL HISTORY:  Past Medical History:  Diagnosis Date   AKI (acute kidney injury) (HCC) 12/17/2017   Allergic rhinitis    Anemia    Arthritis    Asthma    Autonomic neuropathy    Depression    Diabetes mellitus    Diabetes mellitus without complication (HCC)    E coli infection    GERD (gastroesophageal reflux disease)    GERD (gastroesophageal reflux disease)    Gout    Hepatitis A    High cholesterol    Hypertension    Insomnia    Kidney failure    Metabolic encephalopathy    Morbid obesity (HCC)    NASH (nonalcoholic steatohepatitis)    Pneumonia 12/17/2017   Schizophrenia (HCC)    Stroke (HCC)    Vitamin B deficiency    Vitamin D deficiency     SURGICAL HISTORY: Past Surgical History:  Procedure Laterality Date   CYST EXCISION     buttucks   DENTAL SURGERY     IR IMAGING GUIDED PORT INSERTION  01/10/2022   IR IMAGING GUIDED PORT INSERTION  04/21/2022   IR REMOVAL TUN ACCESS W/ PORT W/O FL MOD SED  02/28/2022   TONSILLECTOMY      SOCIAL HISTORY: Social History   Socioeconomic History   Marital status: Unknown    Spouse name: Not on file   Number of children: 2   Years of education: Not on file   Highest education level: Not on file  Occupational History   Occupation: retired/disabled  Tobacco Use   Smoking status: Never   Smokeless tobacco: Never  Vaping Use   Vaping status: Never Used   Substance and Sexual Activity   Alcohol use: Never   Drug use: Never   Sexual activity: Not Currently    Birth control/protection: None  Other Topics Concern   Not on file  Social History Narrative   ** Merged History Encounter **       Social Determinants of Health   Financial Resource Strain: Not on file  Food Insecurity: No Food Insecurity (05/20/2022)   Hunger Vital Sign    Worried About Running Out of Food in the Last Year: Never true    Ran Out of Food in the Last Year: Never true  Transportation Needs: No Transportation Needs (05/20/2022)   PRAPARE - Administrator, Civil Service (Medical): No    Lack of Transportation (Non-Medical): No  Physical Activity: Not on file  Stress: Not on file  Social Connections: Not on file  Intimate Partner Violence: Not At Risk (05/20/2022)   Humiliation, Afraid, Rape, and Kick questionnaire    Fear of Current or Ex-Partner: No    Emotionally Abused: No    Physically Abused: No    Sexually Abused: No    FAMILY HISTORY: Family History  Adopted: Yes  Problem Relation Age of Onset   COPD Daughter     ALLERGIES:  is allergic to vancomycin, emend [aprepitant], fosaprepitant, lasix [furosemide], lasix [furosemide], and pineapple.  MEDICATIONS:  Current Outpatient Medications  Medication Sig Dispense Refill   acetaminophen (TYLENOL) 500 MG tablet Take 1,000 mg by mouth every 6 (six) hours as needed (for pain- CANNOT EXCEED A TOTAL OF 3,000 MG/DAY FROM ALL COMBINED SOURCES).     albuterol (VENTOLIN HFA) 108 (90 Base) MCG/ACT inhaler Inhale 2 puffs into the lungs every 4 (four) hours as needed for wheezing or shortness of breath.     allopurinol (ZYLOPRIM) 100 MG tablet Take 100 mg by mouth daily.     Amino Acids-Protein Hydrolys (PRO-STAT) LIQD Take 30 mLs by mouth 2 (two) times daily between meals.     amLODipine (NORVASC) 10 MG tablet Take 10 mg by mouth daily.     ASPERCREME LIDOCAINE 4 % Place 1 patch onto the skin See  admin instructions. Apply 1 patch to both knees once a day- remove as directed     calamine lotion Apply 1 Application topically 2 (two) times daily.  0   carvedilol (COREG) 6.25 MG tablet Take 6.25 mg by mouth 2 (two) times daily with a meal.     cholecalciferol (VITAMIN D3) 25 MCG (1000 UNIT) tablet Take 2,000 Units by mouth daily.     doxepin (SINEQUAN) 25 MG capsule Take 25 mg by mouth at bedtime.     doxycycline (VIBRA-TABS) 100 MG tablet Take 1 tablet (100 mg total) by mouth 2 (two) times daily. 14 tablet 0   Emollient (EUCERIN BABY EX) Apply 1 application  topically See admin instructions. Apply to affected areas daily where the skin is peeling     famotidine (PEPCID) 20 MG tablet Take 20 mg by mouth every 12 (twelve)  hours.     fluticasone (FLONASE) 50 MCG/ACT nasal spray Place 1 spray into both nostrils daily.     glipiZIDE (GLUCOTROL XL) 10 MG 24 hr tablet Take 10 mg by mouth in the morning and at bedtime.     hydrOXYzine (ATARAX/VISTARIL) 25 MG tablet Take 25 mg by mouth in the morning and at bedtime.     insulin aspart (NOVOLOG) 100 UNIT/ML injection Inject 0-12 Units into the skin See admin instructions. Inject 0-12 units into the skin 4 times a day (before meals and at bedtime) per sliding scale: BGL <60 or >400  = CALL MD; 60-200 = 0 units; 201-250 = 2 units; 251-300 = 4 units; 301-350 = 6 units; 351-400 = 8 units; 401-450 = 10 units; >450 = 12 units     ipratropium-albuterol (DUONEB) 0.5-2.5 (3) MG/3ML SOLN Take 3 mLs by nebulization 3 (three) times daily.     LANTUS SOLOSTAR 100 UNIT/ML Solostar Pen Inject 28-42 Units into the skin See admin instructions. Inject 42 units into the skin in the morning and 28 units at bedtime     lidocaine-prilocaine (EMLA) cream Apply 1 Application topically as needed. (Patient taking differently: Apply 1 Application topically See admin instructions. Apply to port site 1-2 hours prior to chemo- cover with saran wrap and secure with tape) 30 g 0    lisinopril (ZESTRIL) 5 MG tablet Take 5 mg by mouth daily.     loperamide (IMODIUM) 2 MG capsule Take 1 capsule (2 mg total) by mouth as needed for diarrhea or loose stools. 30 capsule 0   loratadine (CLARITIN) 10 MG tablet Take 10 mg by mouth daily.     Nutritional Supplements (BOOST GLUCOSE CONTROL) LIQD Take 237 mLs by mouth 2 (two) times daily between meals.     polyethylene glycol powder (GLYCOLAX/MIRALAX) 17 GM/SCOOP powder Take 17 g by mouth daily as needed for mild constipation (to be mixed into 4-8 ounces of a beverage).     prochlorperazine (COMPAZINE) 10 MG tablet Take 1 tablet (10 mg total) by mouth every 6 (six) hours as needed for nausea or vomiting. 30 tablet 0   Simethicone (GAS-X ULTRA STRENGTH) 180 MG CAPS Take 1 capsule by mouth every 8 (eight) hours as needed (gas).     SYSTANE BALANCE 0.6 % SOLN Place 1 drop into both eyes every 12 (twelve) hours.     nystatin (MYCOSTATIN/NYSTOP) powder Apply topically 2 (two) times daily. (Patient not taking: Reported on 08/02/2022) 15 g 0   No current facility-administered medications for this visit.   Facility-Administered Medications Ordered in Other Visits  Medication Dose Route Frequency Provider Last Rate Last Admin   0.9 %  sodium chloride infusion   Intravenous Continuous Allred, Darrell K, PA-C        REVIEW OF SYSTEMS:   Constitutional: ( - ) fevers, ( - )  chills , ( - ) night sweats Eyes: ( - ) blurriness of vision, ( - ) double vision, ( - ) watery eyes Ears, nose, mouth, throat, and face: ( - ) mucositis, ( - ) sore throat Respiratory: ( - ) cough, ( - ) dyspnea, ( - ) wheezes Cardiovascular: ( - ) palpitation, ( - ) chest discomfort, ( - ) lower extremity swelling Gastrointestinal:  ( -) nausea, ( - ) heartburn, ( - ) change in bowel habits Skin: ( - ) abnormal skin rashes Lymphatics: ( - ) new lymphadenopathy, ( - ) easy bruising Neurological: ( - ) numbness, ( - )  tingling, ( - ) new weaknesses Behavioral/Psych: ( - )  mood change, ( - ) new changes  All other systems were reviewed with the patient and are negative.  PHYSICAL EXAMINATION: ECOG PERFORMANCE STATUS: 2 - Symptomatic, <50% confined to bed  Vitals:   10/11/22 0851  BP: (!) 150/68  Pulse: 69  Resp: 17  Temp: 98 F (36.7 C)  SpO2: 100%     Filed Weights    GENERAL: Well-appearing elderly female, alert, no distress and comfortable SKIN: skin color, texture, turgor are normal, no rashes or significant lesions. Mild erythema over port site, no discharge or tenderness to palpation.  EYES: conjunctiva are pink and non-injected, sclera clear LUNGS: clear to auscultation and percussion with normal breathing effort HEART: regular rate & rhythm and no murmurs and no lower extremity edema Musculoskeletal: no cyanosis of digits and no clubbing  PSYCH: alert & oriented x 3, fluent speech NEURO: no focal motor/sensory deficits   LABORATORY DATA:  I have reviewed the data as listed    Latest Ref Rng & Units 10/11/2022    7:51 AM 09/26/2022    9:23 AM 09/13/2022   10:02 AM  CBC  WBC 4.0 - 10.5 K/uL 7.0  7.2  8.1   Hemoglobin 12.0 - 15.0 g/dL 9.5  7.9  9.6   Hematocrit 36.0 - 46.0 % 28.8  23.8  28.2   Platelets 150 - 400 K/uL 163  66  109        Latest Ref Rng & Units 10/11/2022    7:51 AM 09/26/2022    9:23 AM 09/13/2022   10:02 AM  CMP  Glucose 70 - 99 mg/dL 91  161  096   BUN 8 - 23 mg/dL 22  22  17    Creatinine 0.44 - 1.00 mg/dL 0.45  4.09  8.11   Sodium 135 - 145 mmol/L 142  142  139   Potassium 3.5 - 5.1 mmol/L 3.9  3.7  3.9   Chloride 98 - 111 mmol/L 108  108  105   CO2 22 - 32 mmol/L 27  27  28    Calcium 8.9 - 10.3 mg/dL 9.6  9.5  9.3   Total Protein 6.5 - 8.1 g/dL 6.7  6.6  6.5   Total Bilirubin 0.3 - 1.2 mg/dL 0.4  0.3  0.4   Alkaline Phos 38 - 126 U/L 130  138  138   AST 15 - 41 U/L 15  14  12    ALT 0 - 44 U/L 15  17  13      RADIOGRAPHIC STUDIES: No results found.  ASSESSMENT & PLAN Heather Petty is a 72 y.o. female  with medical history significant for newly diagnosed Hodgkin's lymphoma who presents for a follow up visit.   #Hodgkin's Lymphoma, Stage III --biopsy of abdominal lymph node on 12/27/2021 showed classical Hodgkin's lymphoma.  --Pre-treatment PET scan from [place]ville 5 abdominal adenopathy along with mild hypermetabolism involving mediastinal and bilateral axillary nodes. --Due to neutropenic fever, GCSF injection was added after Cycle 2, Day 15.  --Mid treatment PET scan from 06/14/2022 showed improved thoracoabdominal lymphadenopathy, Deauville score 2-3. Sleen is normal in size without focal lesion. PLAN: --today patient is due for Cycle 6, Day 15 of AVD chemotherapy. --Labs from today show white blood cell 7.0, Hgb 9.5, MCV 101.8, Plt 163. Creatinine and LFTs are normal. --Proceed with treatment today without any dose modifications.  --RTC in 4 weeks for a follow up after post-treatment PET scan.  Sacral superficial injury--improving: --Likely secondary to pressure versus excessive moisture. No open wound seen on previous physical exam --Patient reports staff at [facility] does apply ointment to affected area.Marland Kitchen   #Right inguinal abscess--resolved: --Seen on PET scan from 06/14/2022 that showed 3.7 x 2.0 cm lesion along right perineum  --doxycycline therapy x 7 days with refill x 1.  -- Patient was evaluated by wound care team at her facility who confirm wound has healed and does not require any further packing.   #Port issues: --Removed original port in January 2024 and replaced it in March 2024. --Patient was recently admitted from 05/18/2022-05/24/2022 due to febrile neutropenia. Due to erythema at port site, concern was port infection. IR was consulted and advised to treat conservatively now and continue to use the access.  --Advised to try OTC benadryl or hydrocortisone cream for the pruritus.  #Sigmoid colonic focal hypermetabolism #Hepatic steatosis with possible  cirrhosis --Requested a follow up with GI to further evaluate.  #Supportive Care -- chemotherapy education complete -- port placement complete --baseline echo from 01/06/2022 showed EF 60-65% -- not planning for bleomycin, no need for PFTs.  --  compazine 10mg  PO q6H for nausea (zofran contraindicated due to QT prolongated medications).  -- allopurinol 300mg  PO daily for TLS prophylaxis -- EMLA cream for port -- no pain medication required at this time.    No orders of the defined types were placed in this encounter.   All questions were answered. The patient knows to call the clinic with any problems, questions or concerns.  I have spent a total of 30 minutes minutes of face-to-face and non-face-to-face time, preparing to see the patient, performing a medically appropriate examination, counseling and educating the patient, ordering tests/procedures, communicating with other health care professionals, documenting clinical information in the electronic health record,  and care coordination.   Georga Kaufmann PA-C Dept of Hematology and Oncology [facility] Phone: [phone]   10/11/2022 9:17 AM

## 2022-10-11 NOTE — Patient Instructions (Signed)
 [place] [facility]  Discharge Instructions: Thank you for choosing [facility] to provide your oncology and hematology care.   If you have a lab appointment with the Cancer Center, please go directly to the Cancer Center and check in at the registration area.   Wear comfortable clothing and clothing appropriate for easy access to any Portacath or PICC line.   We strive to give you quality time with your provider. You may need to reschedule your appointment if you arrive late (15 or more minutes).  Arriving late affects you and other patients whose appointments are after yours.  Also, if you miss three or more appointments without notifying the office, you may be dismissed from the clinic at the provider's discretion.      For prescription refill requests, have your pharmacy contact our office and allow 72 hours for refills to be completed.    Today you received the following chemotherapy and/or immunotherapy agents: Adriamycin/Velban/DTIC      To help prevent nausea and vomiting after your treatment, we encourage you to take your nausea medication as directed.  BELOW ARE SYMPTOMS THAT SHOULD BE REPORTED IMMEDIATELY: *FEVER GREATER THAN 100.4 F (38 C) OR HIGHER *CHILLS OR SWEATING *NAUSEA AND VOMITING THAT IS NOT CONTROLLED WITH YOUR NAUSEA MEDICATION *UNUSUAL SHORTNESS OF BREATH *UNUSUAL BRUISING OR BLEEDING *URINARY PROBLEMS (pain or burning when urinating, or frequent urination) *BOWEL PROBLEMS (unusual diarrhea, constipation, pain near the anus) TENDERNESS IN MOUTH AND THROAT WITH OR WITHOUT PRESENCE OF ULCERS (sore throat, sores in mouth, or a toothache) UNUSUAL RASH, SWELLING OR PAIN  UNUSUAL VAGINAL DISCHARGE OR ITCHING   Items with * indicate a potential emergency and should be followed up as soon as possible or go to the Emergency Department if any problems should occur.  Please show the CHEMOTHERAPY ALERT CARD or IMMUNOTHERAPY ALERT  CARD at check-in to the Emergency Department and triage nurse.  Should you have questions after your visit or need to cancel or reschedule your appointment, please contact [place] [facility]  Dept: [phone]  and follow the prompts.  Office hours are 8:00 a.m. to 4:30 p.m. Monday - Friday. Please note that voicemails left after 4:00 p.m. may not be returned until the following business day.  We are closed weekends and major holidays. You have access to a nurse at all times for urgent questions. Please call the main number to the clinic Dept: [phone] and follow the prompts.   For any non-urgent questions, you may also contact your provider using MyChart. We now offer e-Visits for anyone 19 and older to request care online for non-urgent symptoms. For details visit [email or web address].   Also download the MyChart app! Go to the app store, search "MyChart", open the app, select [place], and log in with your MyChart username and password.

## 2022-10-12 ENCOUNTER — Other Ambulatory Visit: Payer: Self-pay

## 2022-10-13 ENCOUNTER — Inpatient Hospital Stay: Payer: Medicare Other

## 2022-10-13 DIAGNOSIS — C8198 Hodgkin lymphoma, unspecified, lymph nodes of multiple sites: Secondary | ICD-10-CM

## 2022-10-13 DIAGNOSIS — Z5111 Encounter for antineoplastic chemotherapy: Secondary | ICD-10-CM | POA: Diagnosis not present

## 2022-10-13 MED ORDER — PEGFILGRASTIM-CBQV 6 MG/0.6ML ~~LOC~~ SOSY
6.0000 mg | PREFILLED_SYRINGE | Freq: Once | SUBCUTANEOUS | Status: AC
  Administered 2022-10-13: 6 mg via SUBCUTANEOUS
  Filled 2022-10-13: qty 0.6

## 2022-10-30 ENCOUNTER — Other Ambulatory Visit: Payer: Self-pay

## 2022-11-09 ENCOUNTER — Encounter (HOSPITAL_COMMUNITY)
Admission: RE | Admit: 2022-11-09 | Discharge: 2022-11-09 | Disposition: A | Discharge status: Home / Self Care | Payer: Medicare Other | Source: Ambulatory Visit | Attending: Physician Assistant | Admitting: Physician Assistant

## 2022-11-09 DIAGNOSIS — C8198 Hodgkin lymphoma, unspecified, lymph nodes of multiple sites: Secondary | ICD-10-CM | POA: Diagnosis present

## 2022-11-09 LAB — GLUCOSE, CAPILLARY: Glucose-Capillary: 81 mg/dL (ref 70–99)

## 2022-11-09 MED ORDER — FLUDEOXYGLUCOSE F - 18 (FDG) INJECTION
10.3500 | Freq: Once | INTRAVENOUS | Status: AC | PRN
  Administered 2022-11-09: 10.35 via INTRAVENOUS

## 2022-11-14 ENCOUNTER — Telehealth: Payer: Self-pay | Admitting: Hematology and Oncology

## 2022-11-15 ENCOUNTER — Other Ambulatory Visit: Payer: Self-pay

## 2022-11-15 DIAGNOSIS — C8198 Hodgkin lymphoma, unspecified, lymph nodes of multiple sites: Secondary | ICD-10-CM

## 2022-11-17 ENCOUNTER — Inpatient Hospital Stay (HOSPITAL_BASED_OUTPATIENT_CLINIC_OR_DEPARTMENT_OTHER): Payer: Medicare Other | Admitting: Hematology and Oncology

## 2022-11-17 ENCOUNTER — Other Ambulatory Visit: Payer: Medicare Other

## 2022-11-17 ENCOUNTER — Inpatient Hospital Stay: Payer: Medicare Other | Attending: Physician Assistant

## 2022-11-17 ENCOUNTER — Ambulatory Visit: Payer: Medicare Other | Admitting: Hematology and Oncology

## 2022-11-17 VITALS — BP 97/57 | HR 65 | Temp 98.1°F | Resp 13

## 2022-11-17 DIAGNOSIS — R110 Nausea: Secondary | ICD-10-CM | POA: Insufficient documentation

## 2022-11-17 DIAGNOSIS — C8173 Other classical Hodgkin lymphoma, intra-abdominal lymph nodes: Secondary | ICD-10-CM | POA: Insufficient documentation

## 2022-11-17 DIAGNOSIS — C8198 Hodgkin lymphoma, unspecified, lymph nodes of multiple sites: Secondary | ICD-10-CM | POA: Diagnosis not present

## 2022-11-17 DIAGNOSIS — Z95828 Presence of other vascular implants and grafts: Secondary | ICD-10-CM

## 2022-11-17 LAB — CBC WITH DIFFERENTIAL (CANCER CENTER ONLY)
Abs Immature Granulocytes: 0.03 10*3/uL (ref 0.00–0.07)
Basophils Absolute: 0 10*3/uL (ref 0.0–0.1)
Basophils Relative: 1 %
Eosinophils Absolute: 0.2 10*3/uL (ref 0.0–0.5)
Eosinophils Relative: 4 %
HCT: 31.5 % — ABNORMAL LOW (ref 36.0–46.0)
Hemoglobin: 10.3 g/dL — ABNORMAL LOW (ref 12.0–15.0)
Immature Granulocytes: 1 %
Lymphocytes Relative: 23 %
Lymphs Abs: 1.2 10*3/uL (ref 0.7–4.0)
MCH: 33.1 pg (ref 26.0–34.0)
MCHC: 32.7 g/dL (ref 30.0–36.0)
MCV: 101.3 fL — ABNORMAL HIGH (ref 80.0–100.0)
Monocytes Absolute: 0.6 10*3/uL (ref 0.1–1.0)
Monocytes Relative: 11 %
Neutro Abs: 3.2 10*3/uL (ref 1.7–7.7)
Neutrophils Relative %: 60 %
Platelet Count: 121 10*3/uL — ABNORMAL LOW (ref 150–400)
RBC: 3.11 MIL/uL — ABNORMAL LOW (ref 3.87–5.11)
RDW: 16.5 % — ABNORMAL HIGH (ref 11.5–15.5)
WBC Count: 5.3 10*3/uL (ref 4.0–10.5)
nRBC: 0 % (ref 0.0–0.2)

## 2022-11-17 LAB — CMP (CANCER CENTER ONLY)
ALT: 25 U/L (ref 0–44)
AST: 29 U/L (ref 15–41)
Albumin: 4.1 g/dL (ref 3.5–5.0)
Alkaline Phosphatase: 150 U/L — ABNORMAL HIGH (ref 38–126)
Anion gap: 7 (ref 5–15)
BUN: 25 mg/dL — ABNORMAL HIGH (ref 8–23)
CO2: 24 mmol/L (ref 22–32)
Calcium: 9.6 mg/dL (ref 8.9–10.3)
Chloride: 107 mmol/L (ref 98–111)
Creatinine: 0.83 mg/dL (ref 0.44–1.00)
GFR, Estimated: >60 mL/min (ref >60)
Glucose, Bld: 159 mg/dL — ABNORMAL HIGH (ref 70–99)
Potassium: 4.3 mmol/L (ref 3.5–5.1)
Sodium: 138 mmol/L (ref 135–145)
Total Bilirubin: 0.3 mg/dL (ref 0.3–1.2)
Total Protein: 7.1 g/dL (ref 6.5–8.1)

## 2022-11-17 NOTE — Progress Notes (Signed)
[name] [phone]   [phone]  PROGRESS NOTE  Patient Care Team: System, Provider Not In as PCP - General Fleet Contras, MD (Internal Medicine)  Hematological/Oncological History # Hodkgin's Lymphoma, Stage III 12/09/2021: CT Abdomen/Pelvis showed retroperitoneal adenopathy, with 2.1 x 3.6 cm node 12/20/2021: establish care with [facility]  12/27/2021: CT abdominal mass biopsy performed, findings consistent with classical Hodgkin's lymphoma. 01/05/2022: PET CT scan showed hypermetabolic abdominal adenopathy consistent with lymphoma.  She has mild hypermetabolism involving the mediastinal and bilateral axillary nodes. 01/17/2022: Cycle 1, Day 1 of AVD chemotherapy 01/31/2022: Cycle 1, Day 15 of AVD chemotherapy 04/24/2021: Cycle 2 Day 15 of AVD chemotherapy 05/09/2022: Cycle 3, Day 1 of AVD chemotherapy 06/06/2022: Cycle 3, Day 15 of AVD chemotherapy 06/14/2022: Mid-treatment PET/CT scan: Improved thoracoabdominal lymphadenopathy (Deauville criteria 2-3).  06/20/2022: Cycle 4, Day 1 of AVD chemotherapy HELD due to right inguinal abscess.  07/05/2022: Cycle 4, Day 1 of AVD chemotherapy  07/19/2022: Cycle 4, Day 15 of AVD chemotherapy  08/02/2022: Cycle 5, Day 1 of AVD chemotherapy  08/16/2022: Cycle 5, Day 15 of AVD chemotherapy HELD due to Hgb 6.7 08/30/2022: Cycle 5, Day 15 of AVD chemotherapy  09/13/2022: Cycle 6, Day 1 of AVD chemotherapy 09/26/2022: Cycle 6, Day 15 of AVD chemotherapy HELD due to Hgb 7.9, Plt 66K 10/11/2022: Cycle 6, Day 15 of AVD chemotherapy 11/09/2022: Post treatment PET CT scan, read pending.   Interval History:  Heather Petty 72 y.o. female with medical history significant for newly diagnosed Hodgkin's lymphoma who presents for a follow up visit. The patient's last visit was on 10/11/2022. She presents today having completed Cycle 6, Day 15 today.   On exam today Heather Petty reports she received her vaccines for COVID,  flu, and RSV.  She reports that they were given in her arms and they cause some soreness but no major side effects.  She reports that she is feeling quite good off chemotherapy.  She reports her energy levels is still "so-so".  She notes that she drinks boost.  She feels like she has been eating well though she has been losing weight.  She reports that she has not yet gained the strength to stand up but is working with physical therapy.  She reports she is not having any runny nose, sore throat, or cough though she is having some congestion due to allergies for which she is taking Flonase.  Otherwise she does not have any signs or symptoms concerning for recurrent disease.  She denies any fevers, chills, sweats, chest pain or cough. She has no other complaints. Rest of the ROS is below.   MEDICAL HISTORY:  Past Medical History:  Diagnosis Date   AKI (acute kidney injury) (HCC) 12/17/2017   Allergic rhinitis    Anemia    Arthritis    Asthma    Autonomic neuropathy    Depression    Diabetes mellitus    Diabetes mellitus without complication (HCC)    E coli infection    GERD (gastroesophageal reflux disease)    GERD (gastroesophageal reflux disease)    Gout    Hepatitis A    High cholesterol    Hypertension    Insomnia    Kidney failure    Metabolic encephalopathy    Morbid obesity (HCC)    NASH (nonalcoholic steatohepatitis)    Pneumonia 12/17/2017   Schizophrenia (HCC)    Stroke (HCC)    Vitamin B deficiency    Vitamin D  deficiency     SURGICAL HISTORY: Past Surgical History:  Procedure Laterality Date   CYST EXCISION     buttucks   DENTAL SURGERY     IR IMAGING GUIDED PORT INSERTION  01/10/2022   IR IMAGING GUIDED PORT INSERTION  04/21/2022   IR REMOVAL TUN ACCESS W/ PORT W/O FL MOD SED  02/28/2022   TONSILLECTOMY      SOCIAL HISTORY: Social History   Socioeconomic History   Marital status: Unknown    Spouse name: Not on file   Number of children: 2   Years of  education: Not on file   Highest education level: Not on file  Occupational History   Occupation: retired/disabled  Tobacco Use   Smoking status: Never   Smokeless tobacco: Never  Vaping Use   Vaping status: Never Used  Substance and Sexual Activity   Alcohol use: Never   Drug use: Never   Sexual activity: Not Currently    Birth control/protection: None  Other Topics Concern   Not on file  Social History Narrative   ** Merged History Encounter **       Social Determinants of Health   Financial Resource Strain: Not on file  Food Insecurity: No Food Insecurity (05/20/2022)   Hunger Vital Sign    Worried About Running Out of Food in the Last Year: Never true    Ran Out of Food in the Last Year: Never true  Transportation Needs: No Transportation Needs (05/20/2022)   PRAPARE - Administrator, Civil Service (Medical): No    Lack of Transportation (Non-Medical): No  Physical Activity: Not on file  Stress: Not on file  Social Connections: Not on file  Intimate Partner Violence: Not At Risk (05/20/2022)   Humiliation, Afraid, Rape, and Kick questionnaire    Fear of Current or Ex-Partner: No    Emotionally Abused: No    Physically Abused: No    Sexually Abused: No    FAMILY HISTORY: Family History  Adopted: Yes  Problem Relation Age of Onset   COPD Daughter     ALLERGIES:  is allergic to vancomycin, emend [aprepitant], fosaprepitant, lasix [furosemide], lasix [furosemide], and pineapple.  MEDICATIONS:  Current Outpatient Medications  Medication Sig Dispense Refill   acetaminophen (TYLENOL) 500 MG tablet Take 1,000 mg by mouth every 6 (six) hours as needed (for pain- CANNOT EXCEED A TOTAL OF 3,000 MG/DAY FROM ALL COMBINED SOURCES).     albuterol (VENTOLIN HFA) 108 (90 Base) MCG/ACT inhaler Inhale 2 puffs into the lungs every 4 (four) hours as needed for wheezing or shortness of breath.     allopurinol (ZYLOPRIM) 100 MG tablet Take 100 mg by mouth daily.      Amino Acids-Protein Hydrolys (PRO-STAT) LIQD Take 30 mLs by mouth 2 (two) times daily between meals.     amLODipine (NORVASC) 10 MG tablet Take 10 mg by mouth daily.     ASPERCREME LIDOCAINE 4 % Place 1 patch onto the skin See admin instructions. Apply 1 patch to both knees once a day- remove as directed     calamine lotion Apply 1 Application topically 2 (two) times daily.  0   carvedilol (COREG) 6.25 MG tablet Take 6.25 mg by mouth 2 (two) times daily with a meal.     cholecalciferol (VITAMIN D3) 25 MCG (1000 UNIT) tablet Take 2,000 Units by mouth daily.     doxepin (SINEQUAN) 25 MG capsule Take 25 mg by mouth at bedtime.     Emollient (  EUCERIN BABY EX) Apply 1 application  topically See admin instructions. Apply to affected areas daily where the skin is peeling     famotidine (PEPCID) 20 MG tablet Take 20 mg by mouth every 12 (twelve) hours.     fluticasone (FLONASE) 50 MCG/ACT nasal spray Place 1 spray into both nostrils daily.     glipiZIDE (GLUCOTROL XL) 10 MG 24 hr tablet Take 10 mg by mouth in the morning and at bedtime.     hydrOXYzine (ATARAX/VISTARIL) 25 MG tablet Take 25 mg by mouth in the morning and at bedtime.     insulin aspart (NOVOLOG) 100 UNIT/ML injection Inject 0-12 Units into the skin See admin instructions. Inject 0-12 units into the skin 4 times a day (before meals and at bedtime) per sliding scale: BGL <60 or >400  = CALL MD; 60-200 = 0 units; 201-250 = 2 units; 251-300 = 4 units; 301-350 = 6 units; 351-400 = 8 units; 401-450 = 10 units; >450 = 12 units     LANTUS SOLOSTAR 100 UNIT/ML Solostar Pen Inject 28-42 Units into the skin See admin instructions. Inject 42 units into the skin in the morning and 28 units at bedtime     lidocaine-prilocaine (EMLA) cream Apply 1 Application topically as needed. (Patient taking differently: Apply 1 Application topically See admin instructions. Apply to port site 1-2 hours prior to chemo- cover with saran wrap and secure with tape) 30 g 0    lisinopril (ZESTRIL) 5 MG tablet Take 5 mg by mouth daily.     loperamide (IMODIUM) 2 MG capsule Take 1 capsule (2 mg total) by mouth as needed for diarrhea or loose stools. 30 capsule 0   loratadine (CLARITIN) 10 MG tablet Take 10 mg by mouth daily.     Nutritional Supplements (BOOST GLUCOSE CONTROL) LIQD Take 237 mLs by mouth 2 (two) times daily between meals.     polyethylene glycol powder (GLYCOLAX/MIRALAX) 17 GM/SCOOP powder Take 17 g by mouth daily as needed for mild constipation (to be mixed into 4-8 ounces of a beverage).     SEMGLEE, YFGN, 100 UNIT/ML injection Inject 24 Units into the skin at bedtime.     Simethicone (GAS-X ULTRA STRENGTH) 180 MG CAPS Take 1 capsule by mouth every 8 (eight) hours as needed (gas).     SYSTANE BALANCE 0.6 % SOLN Place 1 drop into both eyes every 12 (twelve) hours.     doxycycline (VIBRA-TABS) 100 MG tablet Take 1 tablet (100 mg total) by mouth 2 (two) times daily. (Patient not taking: Reported on 11/17/2022) 14 tablet 0   guaiFENesin (MUCINEX) 600 MG 12 hr tablet Take by mouth 2 (two) times daily as needed.     ipratropium-albuterol (DUONEB) 0.5-2.5 (3) MG/3ML SOLN Take 3 mLs by nebulization 3 (three) times daily. (Patient not taking: Reported on 11/17/2022)     nystatin (MYCOSTATIN/NYSTOP) powder Apply topically 2 (two) times daily. (Patient not taking: Reported on 08/02/2022) 15 g 0   prochlorperazine (COMPAZINE) 10 MG tablet Take 1 tablet (10 mg total) by mouth every 6 (six) hours as needed for nausea or vomiting. (Patient not taking: Reported on 11/17/2022) 30 tablet 0   No current facility-administered medications for this visit.   Facility-Administered Medications Ordered in Other Visits  Medication Dose Route Frequency Provider Last Rate Last Admin   0.9 %  sodium chloride infusion   Intravenous Continuous Allred, Darrell K, PA-C        REVIEW OF SYSTEMS:   Constitutional: ( - )  fevers, ( - )  chills , ( - ) night sweats Eyes: ( - ) blurriness of  vision, ( - ) double vision, ( - ) watery eyes Ears, nose, mouth, throat, and face: ( - ) mucositis, ( - ) sore throat Respiratory: ( - ) cough, ( - ) dyspnea, ( - ) wheezes Cardiovascular: ( - ) palpitation, ( - ) chest discomfort, ( - ) lower extremity swelling Gastrointestinal:  ( -) nausea, ( - ) heartburn, ( - ) change in bowel habits Skin: ( - ) abnormal skin rashes Lymphatics: ( - ) new lymphadenopathy, ( - ) easy bruising Neurological: ( - ) numbness, ( - ) tingling, ( - ) new weaknesses Behavioral/Psych: ( - ) mood change, ( - ) new changes  All other systems were reviewed with the patient and are negative.  PHYSICAL EXAMINATION: ECOG PERFORMANCE STATUS: 2 - Symptomatic, <50% confined to bed  Vitals:   11/17/22 1337  BP: (!) 97/57  Pulse: 65  Resp: 13  Temp: 98.1 F (36.7 C)  SpO2: 100%      There were no vitals filed for this visit.   GENERAL: Well-appearing elderly female, alert, no distress and comfortable SKIN: skin color, texture, turgor are normal, no rashes or significant lesions. Mild erythema over port site, no discharge or tenderness to palpation.  EYES: conjunctiva are pink and non-injected, sclera clear LUNGS: clear to auscultation and percussion with normal breathing effort HEART: regular rate & rhythm and no murmurs and no lower extremity edema Musculoskeletal: no cyanosis of digits and no clubbing  PSYCH: alert & oriented x 3, fluent speech NEURO: no focal motor/sensory deficits   LABORATORY DATA:  I have reviewed the data as listed    Latest Ref Rng & Units 11/17/2022   12:50 PM 10/11/2022    7:51 AM 09/26/2022    9:23 AM  CBC  WBC 4.0 - 10.5 K/uL 5.3  7.0  7.2   Hemoglobin 12.0 - 15.0 g/dL 09.3  9.5  7.9   Hematocrit 36.0 - 46.0 % 31.5  28.8  23.8   Platelets 150 - 400 K/uL 121  163  66        Latest Ref Rng & Units 11/17/2022   12:50 PM 10/11/2022    7:51 AM 09/26/2022    9:23 AM  CMP  Glucose 70 - 99 mg/dL 818  91  299   BUN 8 - 23  mg/dL 25  22  22    Creatinine 0.44 - 1.00 mg/dL 3.71  6.96  7.89   Sodium 135 - 145 mmol/L 138  142  142   Potassium 3.5 - 5.1 mmol/L 4.3  3.9  3.7   Chloride 98 - 111 mmol/L 107  108  108   CO2 22 - 32 mmol/L 24  27  27    Calcium 8.9 - 10.3 mg/dL 9.6  9.6  9.5   Total Protein 6.5 - 8.1 g/dL 7.1  6.7  6.6   Total Bilirubin 0.3 - 1.2 mg/dL 0.3  0.4  0.3   Alkaline Phos 38 - 126 U/L 150  130  138   AST 15 - 41 U/L 29  15  14    ALT 0 - 44 U/L 25  15  17      RADIOGRAPHIC STUDIES: No results found.  ASSESSMENT & PLAN Heather Petty is a 72 y.o. female with medical history significant for newly diagnosed Hodgkin's lymphoma who presents for a follow up visit.   #Hodgkin's Lymphoma,  Stage III --biopsy of abdominal lymph node on 12/27/2021 showed classical Hodgkin's lymphoma.  --Pre-treatment PET scan from [facility] 5 abdominal adenopathy along with mild hypermetabolism involving mediastinal and bilateral axillary nodes. --Due to neutropenic fever, GCSF injection was added after Cycle 2, Day 15.  --Mid treatment PET scan from 06/14/2022 showed improved thoracoabdominal lymphadenopathy, Deauville score 2-3. Sleen is normal in size without focal lesion. PLAN: --patient completed Cycle 6, Day 15 of AVD chemotherapy. --Labs from today show white blood cell 5.3, hemoglobin 10.3, MCV 101.3, and platelets of 121. Creatinine and LFTs are normal. --Awaiting results of posttreatment PET CT scan.  Read is currently pending.  My personal read does not show any evidence of residual or recurrent disease. --RTC in 8 weeks for a follow up   Sacral superficial injury--improving: --Likely secondary to pressure versus excessive moisture. No open wound seen on previous physical exam --Patient reports staff at [facility] does apply ointment to affected area.Marland Kitchen   #Right inguinal abscess--resolved: --Seen on PET scan from 06/14/2022 that showed 3.7 x 2.0 cm lesion along right perineum  --doxycycline  therapy x 7 days with refill x 1.  -- Patient was evaluated by wound care team at her facility who confirm wound has healed and does not require any further packing.   #Port issues: --Removed original port in January 2024 and replaced it in March 2024. --Patient was recently admitted from 05/18/2022-05/24/2022 due to febrile neutropenia. Due to erythema at port site, concern was port infection. IR was consulted and advised to treat conservatively now and continue to use the access.  --Advised to try OTC benadryl or hydrocortisone cream for the pruritus.  #Sigmoid colonic focal hypermetabolism #Hepatic steatosis with possible cirrhosis --Requested a follow up with GI to further evaluate.  #Supportive Care -- chemotherapy education complete -- port placement complete.  Port removal can be ordered if follow-up PET CT scan is read as complete remission. --baseline echo from 01/06/2022 showed EF 60-65% -- not planning for bleomycin, no need for PFTs.  --  compazine 10mg  PO q6H for nausea (zofran contraindicated due to QT prolongated medications).  -- allopurinol 300mg  PO daily for TLS prophylaxis -- EMLA cream for port -- no pain medication required at this time.   No orders of the defined types were placed in this encounter.   All questions were answered. The patient knows to call the clinic with any problems, questions or concerns.  I have spent a total of 30 minutes minutes of face-to-face and non-face-to-face time, preparing to see the patient, performing a medically appropriate examination, counseling and educating the patient, ordering tests/procedures, communicating with other health care professionals, documenting clinical information in the electronic health record,  and care coordination.   Ulysees Barns, MD Department of Hematology/Oncology [facility] Phone: [phone] Pager: [phone] Email: [email or web address]  11/20/2022  6:49 PM

## 2022-11-18 ENCOUNTER — Other Ambulatory Visit: Payer: Self-pay

## 2022-11-20 ENCOUNTER — Encounter: Payer: Self-pay | Admitting: Physician Assistant

## 2022-11-20 ENCOUNTER — Encounter: Payer: Self-pay | Admitting: Hematology and Oncology

## 2022-11-21 ENCOUNTER — Telehealth: Payer: Self-pay | Admitting: Physician Assistant

## 2022-11-21 DIAGNOSIS — C8198 Hodgkin lymphoma, unspecified, lymph nodes of multiple sites: Secondary | ICD-10-CM

## 2022-11-21 NOTE — Telephone Encounter (Signed)
I called [name] to review the post treatment PET scan results from 11/09/2022. Findings show no evidence of active lymphoma. Recommend to proceed with medical surveillance. She will return in December for a follow up visit as scheduled.  Patient is eager to remove her port-a-cath so I will make referral for that today. [name] expressed understanding of the plan provided.

## 2022-11-28 ENCOUNTER — Other Ambulatory Visit: Payer: Self-pay

## 2023-01-19 ENCOUNTER — Other Ambulatory Visit: Payer: Self-pay | Admitting: Hematology and Oncology

## 2023-01-19 ENCOUNTER — Inpatient Hospital Stay: Payer: Medicare Other | Attending: Physician Assistant

## 2023-01-19 ENCOUNTER — Inpatient Hospital Stay (HOSPITAL_BASED_OUTPATIENT_CLINIC_OR_DEPARTMENT_OTHER): Payer: Medicare Other | Admitting: Hematology and Oncology

## 2023-01-19 VITALS — BP 132/55 | HR 65 | Temp 97.5°F | Resp 17

## 2023-01-19 DIAGNOSIS — C8173 Other classical Hodgkin lymphoma, intra-abdominal lymph nodes: Secondary | ICD-10-CM | POA: Diagnosis present

## 2023-01-19 DIAGNOSIS — C8198 Hodgkin lymphoma, unspecified, lymph nodes of multiple sites: Secondary | ICD-10-CM

## 2023-01-19 DIAGNOSIS — Z95828 Presence of other vascular implants and grafts: Secondary | ICD-10-CM | POA: Diagnosis not present

## 2023-01-19 LAB — CMP (CANCER CENTER ONLY)
ALT: 23 U/L (ref 0–44)
AST: 15 U/L (ref 15–41)
Albumin: 3.8 g/dL (ref 3.5–5.0)
Alkaline Phosphatase: 157 U/L — ABNORMAL HIGH (ref 38–126)
Anion gap: 7 (ref 5–15)
BUN: 24 mg/dL — ABNORMAL HIGH (ref 8–23)
CO2: 24 mmol/L (ref 22–32)
Calcium: 9 mg/dL (ref 8.9–10.3)
Chloride: 108 mmol/L (ref 98–111)
Creatinine: 0.76 mg/dL (ref 0.44–1.00)
GFR, Estimated: >60 mL/min (ref >60)
Glucose, Bld: 236 mg/dL — ABNORMAL HIGH (ref 70–99)
Potassium: 4.6 mmol/L (ref 3.5–5.1)
Sodium: 139 mmol/L (ref 135–145)
Total Bilirubin: 0.3 mg/dL (ref <1.2)
Total Protein: 6.5 g/dL (ref 6.5–8.1)

## 2023-01-19 LAB — CBC WITH DIFFERENTIAL (CANCER CENTER ONLY)
Abs Immature Granulocytes: 0.02 10*3/uL (ref 0.00–0.07)
Basophils Absolute: 0 10*3/uL (ref 0.0–0.1)
Basophils Relative: 0 %
Eosinophils Absolute: 0.1 10*3/uL (ref 0.0–0.5)
Eosinophils Relative: 1 %
HCT: 31.7 % — ABNORMAL LOW (ref 36.0–46.0)
Hemoglobin: 10.3 g/dL — ABNORMAL LOW (ref 12.0–15.0)
Immature Granulocytes: 0 %
Lymphocytes Relative: 25 %
Lymphs Abs: 1.9 10*3/uL (ref 0.7–4.0)
MCH: 31.5 pg (ref 26.0–34.0)
MCHC: 32.5 g/dL (ref 30.0–36.0)
MCV: 96.9 fL (ref 80.0–100.0)
Monocytes Absolute: 0.4 10*3/uL (ref 0.1–1.0)
Monocytes Relative: 6 %
Neutro Abs: 5.2 10*3/uL (ref 1.7–7.7)
Neutrophils Relative %: 68 %
Platelet Count: 148 10*3/uL — ABNORMAL LOW (ref 150–400)
RBC: 3.27 MIL/uL — ABNORMAL LOW (ref 3.87–5.11)
RDW: 14.9 % (ref 11.5–15.5)
WBC Count: 7.7 10*3/uL (ref 4.0–10.5)
nRBC: 0 % (ref 0.0–0.2)

## 2023-01-19 LAB — LACTATE DEHYDROGENASE: LDH: 174 U/L (ref 98–192)

## 2023-01-19 NOTE — Progress Notes (Signed)
[facility] [phone]   [phone]  PROGRESS NOTE  Patient Care Team: System, Provider Not In as PCP - General Fleet Contras, MD (Internal Medicine)  Hematological/Oncological History # Hodkgin's Lymphoma, Stage III 12/09/2021: CT Abdomen/Pelvis showed retroperitoneal adenopathy, with 2.1 x 3.6 cm node 12/20/2021: establish care with [facility]  12/27/2021: CT abdominal mass biopsy performed, findings consistent with classical Hodgkin's lymphoma. 01/05/2022: PET CT scan showed hypermetabolic abdominal adenopathy consistent with lymphoma.  She has mild hypermetabolism involving the mediastinal and bilateral axillary nodes. 01/17/2022: Cycle 1, Day 1 of AVD chemotherapy 01/31/2022: Cycle 1, Day 15 of AVD chemotherapy 04/24/2021: Cycle 2 Day 15 of AVD chemotherapy 05/09/2022: Cycle 3, Day 1 of AVD chemotherapy 06/06/2022: Cycle 3, Day 15 of AVD chemotherapy 06/14/2022: Mid-treatment PET/CT scan: Improved thoracoabdominal lymphadenopathy (Deauville criteria 2-3).  06/20/2022: Cycle 4, Day 1 of AVD chemotherapy HELD due to right inguinal abscess.  07/05/2022: Cycle 4, Day 1 of AVD chemotherapy  07/19/2022: Cycle 4, Day 15 of AVD chemotherapy  08/02/2022: Cycle 5, Day 1 of AVD chemotherapy  08/16/2022: Cycle 5, Day 15 of AVD chemotherapy HELD due to Hgb 6.7 08/30/2022: Cycle 5, Day 15 of AVD chemotherapy  09/13/2022: Cycle 6, Day 1 of AVD chemotherapy 09/26/2022: Cycle 6, Day 15 of AVD chemotherapy HELD due to Hgb 7.9, Plt 66K 10/11/2022: Cycle 6, Day 15 of AVD chemotherapy 11/09/2022: Post treatment PET CT scan, Deauville 1. No evidence of residual/recurrent disease.   Interval History:  Heather Petty 72 y.o. female with medical history significant for newly diagnosed Hodgkin's lymphoma who presents for a follow up visit. The patient's last visit was on 11/17/2022. She presents today for routine follow up.   On exam today Mrs. Gyamfi reports her hair is  growing back and she is feeling well overall.  She reports that she did have 1 episode where she is feeling quite sick and had pain in her shoulders and was throwing up.  She was given Dramamine and then developed itching all over.  She reports it was "a horrible nightmare".  She is unsure what caused this but she reports she has not had any episodes like it since.  She is back to baseline.  She reports her energy levels have been running low but reports today she is about an 8 out of 10.  She feels like her appetite is good but the food at her center is not very good.  She reports she is not having any bumps or lumps concerning for lymphadenopathy.  She is also not having any infectious symptoms at this time.  She denies any fevers, chills, sweats, chest pain or cough. She has no other complaints. Rest of the ROS is below.   MEDICAL HISTORY:  Past Medical History:  Diagnosis Date   AKI (acute kidney injury) (HCC) 12/17/2017   Allergic rhinitis    Anemia    Arthritis    Asthma    Autonomic neuropathy    Depression    Diabetes mellitus    Diabetes mellitus without complication (HCC)    E coli infection    GERD (gastroesophageal reflux disease)    GERD (gastroesophageal reflux disease)    Gout    Hepatitis A    High cholesterol    Hypertension    Insomnia    Kidney failure    Metabolic encephalopathy    Morbid obesity (HCC)    NASH (nonalcoholic steatohepatitis)    Pneumonia 12/17/2017   Schizophrenia (HCC)  Stroke (HCC)    Vitamin B deficiency    Vitamin D deficiency     SURGICAL HISTORY: Past Surgical History:  Procedure Laterality Date   CYST EXCISION     buttucks   DENTAL SURGERY     IR IMAGING GUIDED PORT INSERTION  01/10/2022   IR IMAGING GUIDED PORT INSERTION  04/21/2022   IR REMOVAL TUN ACCESS W/ PORT W/O FL MOD SED  02/28/2022   TONSILLECTOMY      SOCIAL HISTORY: Social History   Socioeconomic History   Marital status: Unknown    Spouse name: Not on file    Number of children: 2   Years of education: Not on file   Highest education level: Not on file  Occupational History   Occupation: retired/disabled  Tobacco Use   Smoking status: Never   Smokeless tobacco: Never  Vaping Use   Vaping status: Never Used  Substance and Sexual Activity   Alcohol use: Never   Drug use: Never   Sexual activity: Not Currently    Birth control/protection: None  Other Topics Concern   Not on file  Social History Narrative   ** Merged History Encounter **       Social Drivers of Health   Financial Resource Strain: Not on file  Food Insecurity: No Food Insecurity (05/20/2022)   Hunger Vital Sign    Worried About Running Out of Food in the Last Year: Never true    Ran Out of Food in the Last Year: Never true  Transportation Needs: No Transportation Needs (05/20/2022)   PRAPARE - Administrator, Civil Service (Medical): No    Lack of Transportation (Non-Medical): No  Physical Activity: Not on file  Stress: Not on file  Social Connections: Not on file  Intimate Partner Violence: Not At Risk (05/20/2022)   Humiliation, Afraid, Rape, and Kick questionnaire    Fear of Current or Ex-Partner: No    Emotionally Abused: No    Physically Abused: No    Sexually Abused: No    FAMILY HISTORY: Family History  Adopted: Yes  Problem Relation Age of Onset   COPD Daughter     ALLERGIES:  is allergic to vancomycin, emend [aprepitant], fosaprepitant, lasix [furosemide], lasix [furosemide], and pineapple.  MEDICATIONS:  Current Outpatient Medications  Medication Sig Dispense Refill   acetaminophen (TYLENOL) 500 MG tablet Take 1,000 mg by mouth every 6 (six) hours as needed (for pain- CANNOT EXCEED A TOTAL OF 3,000 MG/DAY FROM ALL COMBINED SOURCES).     albuterol (VENTOLIN HFA) 108 (90 Base) MCG/ACT inhaler Inhale 2 puffs into the lungs every 4 (four) hours as needed for wheezing or shortness of breath.     allopurinol (ZYLOPRIM) 100 MG tablet Take 100  mg by mouth daily.     Amino Acids-Protein Hydrolys (PRO-STAT) LIQD Take 30 mLs by mouth 2 (two) times daily between meals.     amLODipine (NORVASC) 10 MG tablet Take 10 mg by mouth daily.     ASPERCREME LIDOCAINE 4 % Place 1 patch onto the skin See admin instructions. Apply 1 patch to both knees once a day- remove as directed     calamine lotion Apply 1 Application topically 2 (two) times daily.  0   carvedilol (COREG) 6.25 MG tablet Take 6.25 mg by mouth 2 (two) times daily with a meal.     cholecalciferol (VITAMIN D3) 25 MCG (1000 UNIT) tablet Take 2,000 Units by mouth daily.     doxepin (SINEQUAN) 25 MG  capsule Take 25 mg by mouth at bedtime.     doxycycline (VIBRA-TABS) 100 MG tablet Take 1 tablet (100 mg total) by mouth 2 (two) times daily. (Patient not taking: Reported on 11/17/2022) 14 tablet 0   Emollient (EUCERIN BABY EX) Apply 1 application  topically See admin instructions. Apply to affected areas daily where the skin is peeling     famotidine (PEPCID) 20 MG tablet Take 20 mg by mouth every 12 (twelve) hours.     fluticasone (FLONASE) 50 MCG/ACT nasal spray Place 1 spray into both nostrils daily.     glipiZIDE (GLUCOTROL XL) 10 MG 24 hr tablet Take 10 mg by mouth in the morning and at bedtime.     guaiFENesin (MUCINEX) 600 MG 12 hr tablet Take by mouth 2 (two) times daily as needed.     hydrOXYzine (ATARAX/VISTARIL) 25 MG tablet Take 25 mg by mouth in the morning and at bedtime.     insulin aspart (NOVOLOG) 100 UNIT/ML injection Inject 0-12 Units into the skin See admin instructions. Inject 0-12 units into the skin 4 times a day (before meals and at bedtime) per sliding scale: BGL <60 or >400  = CALL MD; 60-200 = 0 units; 201-250 = 2 units; 251-300 = 4 units; 301-350 = 6 units; 351-400 = 8 units; 401-450 = 10 units; >450 = 12 units     ipratropium-albuterol (DUONEB) 0.5-2.5 (3) MG/3ML SOLN Take 3 mLs by nebulization 3 (three) times daily. (Patient not taking: Reported on 11/17/2022)      LANTUS SOLOSTAR 100 UNIT/ML Solostar Pen Inject 28-42 Units into the skin See admin instructions. Inject 42 units into the skin in the morning and 28 units at bedtime     lidocaine-prilocaine (EMLA) cream Apply 1 Application topically as needed. (Patient taking differently: Apply 1 Application topically See admin instructions. Apply to port site 1-2 hours prior to chemo- cover with saran wrap and secure with tape) 30 g 0   lisinopril (ZESTRIL) 5 MG tablet Take 5 mg by mouth daily.     loperamide (IMODIUM) 2 MG capsule Take 1 capsule (2 mg total) by mouth as needed for diarrhea or loose stools. 30 capsule 0   loratadine (CLARITIN) 10 MG tablet Take 10 mg by mouth daily.     Nutritional Supplements (BOOST GLUCOSE CONTROL) LIQD Take 237 mLs by mouth 2 (two) times daily between meals.     nystatin (MYCOSTATIN/NYSTOP) powder Apply topically 2 (two) times daily. (Patient not taking: Reported on 08/02/2022) 15 g 0   polyethylene glycol powder (GLYCOLAX/MIRALAX) 17 GM/SCOOP powder Take 17 g by mouth daily as needed for mild constipation (to be mixed into 4-8 ounces of a beverage).     prochlorperazine (COMPAZINE) 10 MG tablet Take 1 tablet (10 mg total) by mouth every 6 (six) hours as needed for nausea or vomiting. (Patient not taking: Reported on 11/17/2022) 30 tablet 0   SEMGLEE, YFGN, 100 UNIT/ML injection Inject 24 Units into the skin at bedtime.     Simethicone (GAS-X ULTRA STRENGTH) 180 MG CAPS Take 1 capsule by mouth every 8 (eight) hours as needed (gas).     SYSTANE BALANCE 0.6 % SOLN Place 1 drop into both eyes every 12 (twelve) hours.     No current facility-administered medications for this visit.   Facility-Administered Medications Ordered in Other Visits  Medication Dose Route Frequency Provider Last Rate Last Admin   0.9 %  sodium chloride infusion   Intravenous Continuous Allred, Darrell K, PA-C  REVIEW OF SYSTEMS:   Constitutional: ( - ) fevers, ( - )  chills , ( - ) night  sweats Eyes: ( - ) blurriness of vision, ( - ) double vision, ( - ) watery eyes Ears, nose, mouth, throat, and face: ( - ) mucositis, ( - ) sore throat Respiratory: ( - ) cough, ( - ) dyspnea, ( - ) wheezes Cardiovascular: ( - ) palpitation, ( - ) chest discomfort, ( - ) lower extremity swelling Gastrointestinal:  ( -) nausea, ( - ) heartburn, ( - ) change in bowel habits Skin: ( - ) abnormal skin rashes Lymphatics: ( - ) new lymphadenopathy, ( - ) easy bruising Neurological: ( - ) numbness, ( - ) tingling, ( - ) new weaknesses Behavioral/Psych: ( - ) mood change, ( - ) new changes  All other systems were reviewed with the patient and are negative.  PHYSICAL EXAMINATION: ECOG PERFORMANCE STATUS: 2 - Symptomatic, <50% confined to bed  Vitals:   01/19/23 1145  BP: (!) 132/55  Pulse: 65  Resp: 17  Temp: (!) 97.5 F (36.4 C)  SpO2: 96%       There were no vitals filed for this visit.   GENERAL: Well-appearing elderly female, alert, no distress and comfortable SKIN: skin color, texture, turgor are normal, no rashes or significant lesions. Mild erythema over port site, no discharge or tenderness to palpation.  EYES: conjunctiva are pink and non-injected, sclera clear LUNGS: clear to auscultation and percussion with normal breathing effort HEART: regular rate & rhythm and no murmurs and no lower extremity edema Musculoskeletal: no cyanosis of digits and no clubbing  PSYCH: alert & oriented x 3, fluent speech NEURO: no focal motor/sensory deficits   LABORATORY DATA:  I have reviewed the data as listed    Latest Ref Rng & Units 01/19/2023   11:03 AM 11/17/2022   12:50 PM 10/11/2022    7:51 AM  CBC  WBC 4.0 - 10.5 K/uL 7.7  5.3  7.0   Hemoglobin 12.0 - 15.0 g/dL 45.4  09.8  9.5   Hematocrit 36.0 - 46.0 % 31.7  31.5  28.8   Platelets 150 - 400 K/uL 148  121  163        Latest Ref Rng & Units 01/19/2023   11:03 AM 11/17/2022   12:50 PM 10/11/2022    7:51 AM  CMP  Glucose  70 - 99 mg/dL 119  147  91   BUN 8 - 23 mg/dL 24  25  22    Creatinine 0.44 - 1.00 mg/dL 8.29  5.62  1.30   Sodium 135 - 145 mmol/L 139  138  142   Potassium 3.5 - 5.1 mmol/L 4.6  4.3  3.9   Chloride 98 - 111 mmol/L 108  107  108   CO2 22 - 32 mmol/L 24  24  27    Calcium 8.9 - 10.3 mg/dL 9.0  9.6  9.6   Total Protein 6.5 - 8.1 g/dL 6.5  7.1  6.7   Total Bilirubin <1.2 mg/dL 0.3  0.3  0.4   Alkaline Phos 38 - 126 U/L 157  150  130   AST 15 - 41 U/L 15  29  15    ALT 0 - 44 U/L 23  25  15      RADIOGRAPHIC STUDIES: No results found.  ASSESSMENT & PLAN Heather Petty is a 72 y.o. female with medical history significant for newly diagnosed Hodgkin's lymphoma who presents for a  follow up visit.   #Hodgkin's Lymphoma, Stage III --biopsy of abdominal lymph node on 12/27/2021 showed classical Hodgkin's lymphoma.  --Pre-treatment PET scan from Deauville 5 abdominal adenopathy along with mild hypermetabolism involving mediastinal and bilateral axillary nodes. --Due to neutropenic fever, GCSF injection was added after Cycle 2, Day 15.  --Mid treatment PET scan from 06/14/2022 showed improved thoracoabdominal lymphadenopathy, Deauville score 2-3. Sleen is normal in size without focal lesion. PLAN: --patient completed 6 cycles of AVD chemotherapy.  --Labs from today show white blood cell 7.7, hemoglobin 10.3, MCV 96.9, platelets 148. Creatinine and LFTs are normal. --Posttreatment PET CT scan does not show any evidence of residual or recurrent disease. Next scan due in April 2025.  --RTC in 16 weeks for a follow up (to line up with PET CT scan).   Sacral superficial injury--improving: --Likely secondary to pressure versus excessive moisture. No open wound seen on previous physical exam --Patient reports staff at [facility] does apply ointment to affected area.Marland Kitchen   #Right inguinal abscess--resolved: --Seen on PET scan from 06/14/2022 that showed 3.7 x 2.0 cm lesion along right perineum   --doxycycline therapy x 7 days with refill x 1.  -- Patient was evaluated by wound care team at her facility who confirm wound has healed and does not require any further packing.   #Port issues: --Removed original port in January 2024 and replaced it in March 2024. --Patient was recently admitted from 05/18/2022-05/24/2022 due to febrile neutropenia. Due to erythema at port site, concern was port infection. IR was consulted and advised to treat conservatively now and continue to use the access.  --Advised to try OTC benadryl or hydrocortisone cream for the pruritus.  #Sigmoid colonic focal hypermetabolism #Hepatic steatosis with possible cirrhosis --Requested a follow up with GI to further evaluate.  #Supportive Care -- chemotherapy education complete -- port placement complete.  Port can be removed.  --baseline echo from 01/06/2022 showed EF 60-65% -- not planning for bleomycin, no need for PFTs.  --  compazine 10mg  PO q6H for nausea (zofran contraindicated due to QT prolongated medications).  -- allopurinol 300mg  PO daily for TLS prophylaxis -- EMLA cream for port -- no pain medication required at this time.   Orders Placed This Encounter  Procedures   CT CHEST ABDOMEN PELVIS W CONTRAST    Standing Status:   Future    Expected Date:   05/10/2023    Expiration Date:   01/19/2024    If indicated for the ordered procedure, I authorize the administration of contrast media per Radiology protocol:   Yes    Does the patient have a contrast media/X-ray dye allergy?:   No    Preferred imaging location?:   [facility]    If indicated for the ordered procedure, I authorize the administration of oral contrast media per Radiology protocol:   Yes    All questions were answered. The patient knows to call the clinic with any problems, questions or concerns.  I have spent a total of 30 minutes minutes of face-to-face and non-face-to-face time, preparing to see the patient, performing a  medically appropriate examination, counseling and educating the patient, ordering tests/procedures, communicating with other health care professionals, documenting clinical information in the electronic health record,  and care coordination.   Ulysees Barns, MD Department of Hematology/Oncology [facility] Phone: [phone] Pager: [phone] Email: [email or web address]  01/19/2023 4:13 PM

## 2023-01-23 ENCOUNTER — Other Ambulatory Visit: Payer: Self-pay

## 2023-01-26 ENCOUNTER — Other Ambulatory Visit: Payer: Self-pay

## 2023-02-02 ENCOUNTER — Ambulatory Visit (HOSPITAL_COMMUNITY)
Admission: RE | Admit: 2023-02-02 | Discharge: 2023-02-02 | Disposition: A | Discharge status: Home / Self Care | Payer: Medicare Other | Source: Ambulatory Visit | Attending: Physician Assistant | Admitting: Physician Assistant

## 2023-02-02 DIAGNOSIS — C8198 Hodgkin lymphoma, unspecified, lymph nodes of multiple sites: Secondary | ICD-10-CM | POA: Insufficient documentation

## 2023-02-02 HISTORY — PX: IR REMOVAL TUN ACCESS W/ PORT W/O FL MOD SED: IMG2290

## 2023-02-02 MED ORDER — LIDOCAINE-EPINEPHRINE 1 %-1:100000 IJ SOLN
INTRAMUSCULAR | Status: AC
  Filled 2023-02-02: qty 1

## 2023-02-02 MED ORDER — LIDOCAINE-EPINEPHRINE 1 %-1:100000 IJ SOLN
20.0000 mL | Freq: Once | INTRAMUSCULAR | Status: AC
  Administered 2023-02-02: 7 mL via INTRADERMAL

## 2023-02-02 NOTE — Procedures (Signed)
Interventional Radiology Procedure Note  Procedure: Chest port removal  Indication: Completion of Chemotherapy  Findings: Please refer to procedural dictation for full description.  Complications: None  EBL: < 10 mL  Acquanetta Belling, MD [phone]

## 2023-04-10 ENCOUNTER — Encounter: Payer: Self-pay | Admitting: Physician Assistant

## 2023-04-10 ENCOUNTER — Encounter: Payer: Self-pay | Admitting: Hematology and Oncology

## 2023-05-03 ENCOUNTER — Encounter: Payer: Self-pay | Admitting: Hematology and Oncology

## 2023-05-03 ENCOUNTER — Encounter: Payer: Self-pay | Admitting: Physician Assistant

## 2023-05-09 ENCOUNTER — Encounter: Payer: Self-pay | Admitting: Physician Assistant

## 2023-05-09 ENCOUNTER — Encounter: Payer: Self-pay | Admitting: Hematology and Oncology

## 2023-05-10 ENCOUNTER — Ambulatory Visit (HOSPITAL_COMMUNITY)
Admission: RE | Admit: 2023-05-10 | Discharge: 2023-05-10 | Disposition: A | Discharge status: Home / Self Care | Source: Ambulatory Visit | Attending: Hematology and Oncology | Admitting: Hematology and Oncology

## 2023-05-10 DIAGNOSIS — C8198 Hodgkin lymphoma, unspecified, lymph nodes of multiple sites: Secondary | ICD-10-CM | POA: Diagnosis present

## 2023-05-10 LAB — POCT I-STAT CREATININE: Creatinine, Ser: 1 mg/dL (ref 0.44–1.00)

## 2023-05-10 MED ORDER — IOHEXOL 300 MG/ML  SOLN
100.0000 mL | Freq: Once | INTRAMUSCULAR | Status: AC | PRN
  Administered 2023-05-10: 100 mL via INTRAVENOUS

## 2023-05-10 MED ORDER — SODIUM CHLORIDE (PF) 0.9 % IJ SOLN
INTRAMUSCULAR | Status: AC
  Filled 2023-05-10: qty 50

## 2023-05-11 ENCOUNTER — Telehealth: Payer: Self-pay | Admitting: *Deleted

## 2023-05-11 NOTE — Telephone Encounter (Signed)
 TCT patient regarding recent scan results.  Spoke with her. Advised that her CT scan showed no evidence of recurrent lymphoma. We will see her back later this month as scheduled. Pt pleased with results. She is aware of her upcoming appts for labs and Dr. Leonides Schanz.

## 2023-05-11 NOTE — Telephone Encounter (Signed)
-----   Message from Ulysees Barns IV sent at 05/11/2023  8:24 AM EDT ----- Please let Heather Petty know her CT scan showed no evidence of recurrent lymphoma. We will see her back later this month as scheduled. ----- Message ----- From: Leory Plowman, Rad Results In Sent: 05/10/2023   4:33 PM EDT To: Jaci Standard, MD

## 2023-05-24 ENCOUNTER — Inpatient Hospital Stay (HOSPITAL_BASED_OUTPATIENT_CLINIC_OR_DEPARTMENT_OTHER): Payer: Medicare Other | Admitting: Hematology and Oncology

## 2023-05-24 ENCOUNTER — Inpatient Hospital Stay: Payer: Medicare Other | Attending: Hematology and Oncology

## 2023-05-24 ENCOUNTER — Other Ambulatory Visit: Payer: Self-pay | Admitting: Hematology and Oncology

## 2023-05-24 VITALS — BP 147/72 | HR 78 | Temp 97.9°F | Resp 14

## 2023-05-24 DIAGNOSIS — C8198 Hodgkin lymphoma, unspecified, lymph nodes of multiple sites: Secondary | ICD-10-CM

## 2023-05-24 DIAGNOSIS — C8170 Other classical Hodgkin lymphoma, unspecified site: Secondary | ICD-10-CM | POA: Diagnosis present

## 2023-05-24 DIAGNOSIS — D539 Nutritional anemia, unspecified: Secondary | ICD-10-CM

## 2023-05-24 LAB — CMP (CANCER CENTER ONLY)
ALT: 20 U/L (ref 0–44)
AST: 15 U/L (ref 15–41)
Albumin: 4.4 g/dL (ref 3.5–5.0)
Alkaline Phosphatase: 162 U/L — ABNORMAL HIGH (ref 38–126)
Anion gap: 9 (ref 5–15)
BUN: 20 mg/dL (ref 8–23)
CO2: 26 mmol/L (ref 22–32)
Calcium: 9.8 mg/dL (ref 8.9–10.3)
Chloride: 102 mmol/L (ref 98–111)
Creatinine: 0.9 mg/dL (ref 0.44–1.00)
GFR, Estimated: >60 mL/min (ref >60)
Glucose, Bld: 261 mg/dL — ABNORMAL HIGH (ref 70–99)
Potassium: 4 mmol/L (ref 3.5–5.1)
Sodium: 137 mmol/L (ref 135–145)
Total Bilirubin: 0.4 mg/dL (ref 0.0–1.2)
Total Protein: 7.7 g/dL (ref 6.5–8.1)

## 2023-05-24 LAB — CBC WITH DIFFERENTIAL (CANCER CENTER ONLY)
Abs Immature Granulocytes: 0.04 10*3/uL (ref 0.00–0.07)
Basophils Absolute: 0 10*3/uL (ref 0.0–0.1)
Basophils Relative: 0 %
Eosinophils Absolute: 0.2 10*3/uL (ref 0.0–0.5)
Eosinophils Relative: 2 %
HCT: 35.9 % — ABNORMAL LOW (ref 36.0–46.0)
Hemoglobin: 12 g/dL (ref 12.0–15.0)
Immature Granulocytes: 0 %
Lymphocytes Relative: 21 %
Lymphs Abs: 2.1 10*3/uL (ref 0.7–4.0)
MCH: 30.9 pg (ref 26.0–34.0)
MCHC: 33.4 g/dL (ref 30.0–36.0)
MCV: 92.5 fL (ref 80.0–100.0)
Monocytes Absolute: 0.5 10*3/uL (ref 0.1–1.0)
Monocytes Relative: 5 %
Neutro Abs: 7.2 10*3/uL (ref 1.7–7.7)
Neutrophils Relative %: 72 %
Platelet Count: 148 10*3/uL — ABNORMAL LOW (ref 150–400)
RBC: 3.88 MIL/uL (ref 3.87–5.11)
RDW: 14.8 % (ref 11.5–15.5)
WBC Count: 10.1 10*3/uL (ref 4.0–10.5)
nRBC: 0 % (ref 0.0–0.2)

## 2023-05-24 LAB — LACTATE DEHYDROGENASE: LDH: 181 U/L (ref 98–192)

## 2023-05-24 NOTE — Progress Notes (Signed)
 [facility] [phone]   [phone]  PROGRESS NOTE  Patient Care Team: System, Provider Not In as PCP - General Charle Congo, MD (Internal Medicine)  Hematological/Oncological History # Hodkgin's Lymphoma, Stage III 12/09/2021: CT Abdomen/Pelvis showed retroperitoneal adenopathy, with 2.1 x 3.6 cm node 12/20/2021: establish care with [facility]  12/27/2021: CT abdominal mass biopsy performed, findings consistent with classical Hodgkin's lymphoma. 01/05/2022: PET CT scan showed hypermetabolic abdominal adenopathy consistent with lymphoma.  She has mild hypermetabolism involving the mediastinal and bilateral axillary nodes. 01/17/2022: Cycle 1, Day 1 of AVD chemotherapy 01/31/2022: Cycle 1, Day 15 of AVD chemotherapy 04/24/2021: Cycle 2 Day 15 of AVD chemotherapy 05/09/2022: Cycle 3, Day 1 of AVD chemotherapy 06/06/2022: Cycle 3, Day 15 of AVD chemotherapy 06/14/2022: Mid-treatment PET/CT scan: Improved thoracoabdominal lymphadenopathy (Deauville criteria 2-3).  06/20/2022: Cycle 4, Day 1 of AVD chemotherapy HELD due to right inguinal abscess.  07/05/2022: Cycle 4, Day 1 of AVD chemotherapy  07/19/2022: Cycle 4, Day 15 of AVD chemotherapy  08/02/2022: Cycle 5, Day 1 of AVD chemotherapy  08/16/2022: Cycle 5, Day 15 of AVD chemotherapy HELD due to Hgb 6.7 08/30/2022: Cycle 5, Day 15 of AVD chemotherapy  09/13/2022: Cycle 6, Day 1 of AVD chemotherapy 09/26/2022: Cycle 6, Day 15 of AVD chemotherapy HELD due to Hgb 7.9, Plt 66K 10/11/2022: Cycle 6, Day 15 of AVD chemotherapy 11/09/2022: Post treatment PET CT scan, Deauville 1. No evidence of residual/recurrent disease.   Interval History:  Heather Petty 73 y.o. female with medical history significant for newly diagnosed Hodgkin's lymphoma who presents for a follow up visit. The patient's last visit was on 01/19/2023. She presents today for routine follow up.   On exam today Heather Petty reports he is having  a lot of trouble with the pollen.  She reports she is having cough and congestion as well as a lot of sneezing.  She has been taking some Claritin  with little relief.  She reports that she has not had any recent viral infections.  She reports that she has strong energy and good appetite.  She denies any bumps or lumps concerning for lymphadenopathy.  She does have an occasional headache.  She reports that she would like to try to lose some weight.  Overall she is at her baseline level of health and has no questions concerns or complaints today.  She denies any fevers, chills, sweats, chest pain or cough. Rest of the ROS is below.   MEDICAL HISTORY:  Past Medical History:  Diagnosis Date   AKI (acute kidney injury) (HCC) 12/17/2017   Allergic rhinitis    Anemia    Arthritis    Asthma    Autonomic neuropathy    Depression    Diabetes mellitus    Diabetes mellitus without complication (HCC)    E coli infection    GERD (gastroesophageal reflux disease)    GERD (gastroesophageal reflux disease)    Gout    Hepatitis A    High cholesterol    Hypertension    Insomnia    Kidney failure    Metabolic encephalopathy    Morbid obesity (HCC)    NASH (nonalcoholic steatohepatitis)    Pneumonia 12/17/2017   Schizophrenia (HCC)    Stroke (HCC)    Vitamin B deficiency    Vitamin D  deficiency     SURGICAL HISTORY: Past Surgical History:  Procedure Laterality Date   CYST EXCISION     buttucks   DENTAL SURGERY  IR IMAGING GUIDED PORT INSERTION  01/10/2022   IR IMAGING GUIDED PORT INSERTION  04/21/2022   IR REMOVAL TUN ACCESS W/ PORT W/O FL MOD SED  02/28/2022   IR REMOVAL TUN ACCESS W/ PORT W/O FL MOD SED  02/02/2023   TONSILLECTOMY      SOCIAL HISTORY: Social History   Socioeconomic History   Marital status: Unknown    Spouse name: Not on file   Number of children: 2   Years of education: Not on file   Highest education level: Not on file  Occupational History   Occupation:  retired/disabled  Tobacco Use   Smoking status: Never   Smokeless tobacco: Never  Vaping Use   Vaping status: Never Used  Substance and Sexual Activity   Alcohol  use: Never   Drug use: Never   Sexual activity: Not Currently    Birth control/protection: None  Other Topics Concern   Not on file  Social History Narrative   ** Merged History Encounter **       Social Drivers of Health   Financial Resource Strain: Not on file  Food Insecurity: No Food Insecurity (05/20/2022)   Hunger Vital Sign    Worried About Running Out of Food in the Last Year: Never true    Ran Out of Food in the Last Year: Never true  Transportation Needs: No Transportation Needs (05/20/2022)   PRAPARE - Administrator, Civil Service (Medical): No    Lack of Transportation (Non-Medical): No  Physical Activity: Not on file  Stress: Not on file  Social Connections: Not on file  Intimate Partner Violence: Not At Risk (05/20/2022)   Humiliation, Afraid, Rape, and Kick questionnaire    Fear of Current or Ex-Partner: No    Emotionally Abused: No    Physically Abused: No    Sexually Abused: No    FAMILY HISTORY: Family History  Adopted: Yes  Problem Relation Age of Onset   COPD Daughter     ALLERGIES:  is allergic to vancomycin , emend [aprepitant], fosaprepitant , lasix  [furosemide ], lasix  [furosemide ], and pineapple.  MEDICATIONS:  Current Outpatient Medications  Medication Sig Dispense Refill   acetaminophen  (TYLENOL ) 500 MG tablet Take 1,000 mg by mouth every 6 (six) hours as needed (for pain- CANNOT EXCEED A TOTAL OF 3,000 MG/DAY FROM ALL COMBINED SOURCES).     albuterol  (VENTOLIN  HFA) 108 (90 Base) MCG/ACT inhaler Inhale 2 puffs into the lungs every 4 (four) hours as needed for wheezing or shortness of breath.     allopurinol  (ZYLOPRIM ) 100 MG tablet Take 100 mg by mouth daily.     Amino Acids -Protein Hydrolys (PRO-STAT) LIQD Take 30 mLs by mouth 2 (two) times daily between meals.      amLODipine  (NORVASC ) 10 MG tablet Take 10 mg by mouth daily.     ASPERCREME LIDOCAINE  4 % Place 1 patch onto the skin See admin instructions. Apply 1 patch to both knees once a day- remove as directed     calamine lotion Apply 1 Application topically 2 (two) times daily.  0   carvedilol  (COREG ) 6.25 MG tablet Take 6.25 mg by mouth 2 (two) times daily with a meal.     cholecalciferol  (VITAMIN D3) 25 MCG (1000 UNIT) tablet Take 2,000 Units by mouth daily.     doxepin  (SINEQUAN ) 25 MG capsule Take 25 mg by mouth at bedtime.     doxycycline  (VIBRA -TABS) 100 MG tablet Take 1 tablet (100 mg total) by mouth 2 (two) times daily. (Patient  not taking: Reported on 11/17/2022) 14 tablet 0   Emollient (EUCERIN BABY EX) Apply 1 application  topically See admin instructions. Apply to affected areas daily where the skin is peeling     famotidine  (PEPCID ) 20 MG tablet Take 20 mg by mouth every 12 (twelve) hours.     fluticasone  (FLONASE ) 50 MCG/ACT nasal spray Place 1 spray into both nostrils daily.     glipiZIDE (GLUCOTROL XL) 10 MG 24 hr tablet Take 10 mg by mouth in the morning and at bedtime.     guaiFENesin  (MUCINEX ) 600 MG 12 hr tablet Take by mouth 2 (two) times daily as needed.     hydrOXYzine  (ATARAX /VISTARIL ) 25 MG tablet Take 25 mg by mouth in the morning and at bedtime.     insulin  aspart (NOVOLOG ) 100 UNIT/ML injection Inject 0-12 Units into the skin See admin instructions. Inject 0-12 units into the skin 4 times a day (before meals and at bedtime) per sliding scale: BGL <60 or >400  = CALL MD; 60-200 = 0 units; 201-250 = 2 units; 251-300 = 4 units; 301-350 = 6 units; 351-400 = 8 units; 401-450 = 10 units; >450 = 12 units     ipratropium-albuterol  (DUONEB) 0.5-2.5 (3) MG/3ML SOLN Take 3 mLs by nebulization 3 (three) times daily. (Patient not taking: Reported on 11/17/2022)     LANTUS  SOLOSTAR 100 UNIT/ML Solostar Pen Inject 28-42 Units into the skin See admin instructions. Inject 42 units into the skin in  the morning and 28 units at bedtime     lidocaine -prilocaine  (EMLA ) cream Apply 1 Application topically as needed. (Patient taking differently: Apply 1 Application topically See admin instructions. Apply to port site 1-2 hours prior to chemo- cover with saran wrap and secure with tape) 30 g 0   lisinopril  (ZESTRIL ) 5 MG tablet Take 5 mg by mouth daily.     loperamide  (IMODIUM ) 2 MG capsule Take 1 capsule (2 mg total) by mouth as needed for diarrhea or loose stools. 30 capsule 0   loratadine  (CLARITIN ) 10 MG tablet Take 10 mg by mouth daily.     Nutritional Supplements (BOOST GLUCOSE CONTROL) LIQD Take 237 mLs by mouth 2 (two) times daily between meals.     nystatin  (MYCOSTATIN /NYSTOP ) powder Apply topically 2 (two) times daily. (Patient not taking: Reported on 08/02/2022) 15 g 0   polyethylene glycol powder (GLYCOLAX /MIRALAX ) 17 GM/SCOOP powder Take 17 g by mouth daily as needed for mild constipation (to be mixed into 4-8 ounces of a beverage).     prochlorperazine  (COMPAZINE ) 10 MG tablet Take 1 tablet (10 mg total) by mouth every 6 (six) hours as needed for nausea or vomiting. (Patient not taking: Reported on 11/17/2022) 30 tablet 0   SEMGLEE , YFGN, 100 UNIT/ML injection Inject 24 Units into the skin at bedtime.     Simethicone  (GAS-X ULTRA STRENGTH) 180 MG CAPS Take 1 capsule by mouth every 8 (eight) hours as needed (gas).     SYSTANE BALANCE 0.6 % SOLN Place 1 drop into both eyes every 12 (twelve) hours.     No current facility-administered medications for this visit.   Facility-Administered Medications Ordered in Other Visits  Medication Dose Route Frequency Provider Last Rate Last Admin   0.9 %  sodium chloride  infusion   Intravenous Continuous Allred, Darrell K, PA-C        REVIEW OF SYSTEMS:   Constitutional: ( - ) fevers, ( - )  chills , ( - ) night sweats Eyes: ( - )  blurriness of vision, ( - ) double vision, ( - ) watery eyes Ears, nose, mouth, throat, and face: ( - ) mucositis, ( -  ) sore throat Respiratory: ( - ) cough, ( - ) dyspnea, ( - ) wheezes Cardiovascular: ( - ) palpitation, ( - ) chest discomfort, ( - ) lower extremity swelling Gastrointestinal:  ( -) nausea, ( - ) heartburn, ( - ) change in bowel habits Skin: ( - ) abnormal skin rashes Lymphatics: ( - ) new lymphadenopathy, ( - ) easy bruising Neurological: ( - ) numbness, ( - ) tingling, ( - ) new weaknesses Behavioral/Psych: ( - ) mood change, ( - ) new changes  All other systems were reviewed with the patient and are negative.  PHYSICAL EXAMINATION: ECOG PERFORMANCE STATUS: 2 - Symptomatic, <50% confined to bed  Vitals:   05/24/23 1201  BP: (!) 147/72  Pulse: 78  Resp: 14  Temp: 97.9 F (36.6 C)  SpO2: 100%        There were no vitals filed for this visit.   GENERAL: Well-appearing elderly female, alert, no distress and comfortable SKIN: skin color, texture, turgor are normal, no rashes or significant lesions. Mild erythema over port site, no discharge or tenderness to palpation.  EYES: conjunctiva are pink and non-injected, sclera clear LUNGS: clear to auscultation and percussion with normal breathing effort HEART: regular rate & rhythm and no murmurs and no lower extremity edema Musculoskeletal: no cyanosis of digits and no clubbing  PSYCH: alert & oriented x 3, fluent speech NEURO: no focal motor/sensory deficits   LABORATORY DATA:  I have reviewed the data as listed    Latest Ref Rng & Units 05/24/2023   11:12 AM 01/19/2023   11:03 AM 11/17/2022   12:50 PM  CBC  WBC 4.0 - 10.5 K/uL 10.1  7.7  5.3   Hemoglobin 12.0 - 15.0 g/dL 40.9  81.1  91.4   Hematocrit 36.0 - 46.0 % 35.9  31.7  31.5   Platelets 150 - 400 K/uL 148  148  121        Latest Ref Rng & Units 05/24/2023   11:12 AM 05/10/2023   10:52 AM 01/19/2023   11:03 AM  CMP  Glucose 70 - 99 mg/dL 782   956   BUN 8 - 23 mg/dL 20   24   Creatinine 2.13 - 1.00 mg/dL 0.86  5.78  4.69   Sodium 135 - 145 mmol/L 137   139    Potassium 3.5 - 5.1 mmol/L 4.0   4.6   Chloride 98 - 111 mmol/L 102   108   CO2 22 - 32 mmol/L 26   24   Calcium  8.9 - 10.3 mg/dL 9.8   9.0   Total Protein 6.5 - 8.1 g/dL 7.7   6.5   Total Bilirubin 0.0 - 1.2 mg/dL 0.4   0.3   Alkaline Phos 38 - 126 U/L 162   157   AST 15 - 41 U/L 15   15   ALT 0 - 44 U/L 20   23     RADIOGRAPHIC STUDIES: CT CHEST ABDOMEN PELVIS W CONTRAST Result Date: 05/10/2023 CLINICAL DATA:  History of Hodgkin's lymphoma, follow-up. * Tracking Code: BO * EXAM: CT CHEST, ABDOMEN, AND PELVIS WITH CONTRAST TECHNIQUE: Multidetector CT imaging of the chest, abdomen and pelvis was performed following the standard protocol during bolus administration of intravenous contrast. RADIATION DOSE REDUCTION: This exam was performed according to the departmental dose-optimization  program which includes automated exposure control, adjustment of the mA and/or kV according to patient size and/or use of iterative reconstruction technique. CONTRAST:  100mL OMNIPAQUE  IOHEXOL  300 MG/ML  SOLN COMPARISON:  Multiple priors including PET-CT November 09, 2022 FINDINGS: CT CHEST FINDINGS Cardiovascular: Aortic atherosclerosis. No central pulmonary embolus on this nondedicated study. Normal size heart. Coronary artery calcifications. Mediastinum/Nodes: No suspicious thyroid  nodule. No pathologically enlarged mediastinal, hilar, axillary or subpectoral lymph nodes. Lungs/Pleura: Stable 4 mm subpleural right middle lobe pulmonary nodule on image 95/4. No new suspicious pulmonary nodules or masses. Scattered atelectasis/scarring. Musculoskeletal: No aggressive lytic or blastic lesion of bone. Multilevel degenerative changes spine. Degenerative changes shoulders. CT ABDOMEN PELVIS FINDINGS Hepatobiliary: No suspicious hepatic lesion. Cholelithiasis without findings of acute cholecystitis. No biliary ductal dilation. Pancreas: No pancreatic ductal dilation or evidence of acute inflammation. Spleen: No splenomegaly.  Adrenals/Urinary Tract: No suspicious adrenal nodule/mass. No hydronephrosis. Kidneys demonstrate symmetric enhancement. Urinary bladder is unremarkable for degree of distension. Stomach/Bowel: Stomach is unremarkable for degree of distension. No pathologic dilation of small or large bowel. None of the appendix. Colonic diverticulosis. Vascular/Lymphatic: Aortic atherosclerosis. No pathologically enlarged abdominal or pelvic lymph nodes. Reproductive: Uterus and bilateral adnexa are unremarkable. Other: No significant abdominopelvic free fluid. Musculoskeletal: No aggressive lytic or blastic lesion of bone. Multilevel degenerative changes of the spine. IMPRESSION: 1. No evidence of recurrent lymphoma within the chest, abdomen or pelvis. 2. Stable 4 mm subpleural right middle lobe pulmonary nodule. 3. Cholelithiasis without findings of acute cholecystitis. 4. Colonic diverticulosis. 5. Aortic atherosclerosis. Electronically Signed   By: Tama Fails M.D.   On: 05/10/2023 16:31    ASSESSMENT & PLAN Heather Petty is a 73 y.o. female with medical history significant for newly diagnosed Hodgkin's lymphoma who presents for a follow up visit.   #Hodgkin's Lymphoma, Stage III --biopsy of abdominal lymph node on 12/27/2021 showed classical Hodgkin's lymphoma.  --Pre-treatment PET scan from Deauville 5 abdominal adenopathy along with mild hypermetabolism involving mediastinal and bilateral axillary nodes. --Due to neutropenic fever, GCSF injection was added after Cycle 2, Day 15.  --Mid treatment PET scan from 06/14/2022 showed improved thoracoabdominal lymphadenopathy, Deauville score 2-3. Sleen is normal in size without focal lesion. PLAN: --patient completed 6 cycles of AVD chemotherapy.  --Labs from today show white blood cell 10.1. Hgb 12.0, MCV 92.5, Plt 148. Creatinine and LFTs are normal. --Posttreatment PET CT scan does not show any evidence of residual or recurrent disease. Last scan in April 2025  showed no evidence of residual/recurrent disease. Next scan due in Oct 2025.  --RTC in 3 months for a follow up with 6 month repeat CT scan.   Sacral superficial injury--improving: --Likely secondary to pressure versus excessive moisture. No open wound seen on previous physical exam --Patient reports staff at [facility] does apply ointment to affected area.Aaron Aas   #Right inguinal abscess--resolved: --Seen on PET scan from 06/14/2022 that showed 3.7 x 2.0 cm lesion along right perineum  --doxycycline  therapy x 7 days with refill x 1.  -- Patient was evaluated by wound care team at her facility who confirm wound has healed and does not require any further packing.   #Port issues: --Removed original port in January 2024 and replaced it in March 2024. --Patient was recently admitted from 05/18/2022-05/24/2022 due to febrile neutropenia. Due to erythema at port site, concern was port infection. IR was consulted and advised to treat conservatively now and continue to use the access.  --Advised to try OTC benadryl  or hydrocortisone   cream for the pruritus.  #Sigmoid colonic focal hypermetabolism #Hepatic steatosis with possible cirrhosis --Requested a follow up with GI to further evaluate.  #Supportive Care -- chemotherapy education complete -- port placement complete.  Port can be removed.  --baseline echo from 01/06/2022 showed EF 60-65% -- not planning for bleomycin, no need for PFTs.  --  compazine  10mg  PO q6H for nausea (zofran  contraindicated due to QT prolongated medications).  -- allopurinol  300mg  PO daily for TLS prophylaxis -- EMLA  cream for port -- no pain medication required at this time.   No orders of the defined types were placed in this encounter.   All questions were answered. The patient knows to call the clinic with any problems, questions or concerns.  I have spent a total of 30 minutes minutes of face-to-face and non-face-to-face time, preparing to see the patient,  performing a medically appropriate examination, counseling and educating the patient, ordering tests/procedures, communicating with other health care professionals, documenting clinical information in the electronic health record,  and care coordination.   Rogerio Clay, MD Department of Hematology/Oncology [facility] Phone: [phone] Pager: [phone] Email: [email or web address]  05/27/2023 5:58 PM

## 2023-05-25 ENCOUNTER — Other Ambulatory Visit: Payer: Self-pay

## 2023-05-27 ENCOUNTER — Encounter: Payer: Self-pay | Admitting: Hematology and Oncology

## 2023-05-27 ENCOUNTER — Encounter: Payer: Self-pay | Admitting: Physician Assistant

## 2023-08-22 ENCOUNTER — Other Ambulatory Visit: Payer: Self-pay

## 2023-08-23 ENCOUNTER — Inpatient Hospital Stay (HOSPITAL_BASED_OUTPATIENT_CLINIC_OR_DEPARTMENT_OTHER): Admitting: Hematology and Oncology

## 2023-08-23 ENCOUNTER — Inpatient Hospital Stay: Attending: Hematology and Oncology

## 2023-08-23 ENCOUNTER — Other Ambulatory Visit: Payer: Self-pay | Admitting: Hematology and Oncology

## 2023-08-23 VITALS — BP 133/65 | HR 58 | Temp 97.7°F | Resp 13 | Wt 228.0 lb

## 2023-08-23 DIAGNOSIS — C8170 Other classical Hodgkin lymphoma, unspecified site: Secondary | ICD-10-CM | POA: Insufficient documentation

## 2023-08-23 DIAGNOSIS — C8198 Hodgkin lymphoma, unspecified, lymph nodes of multiple sites: Secondary | ICD-10-CM

## 2023-08-23 LAB — CMP (CANCER CENTER ONLY)
ALT: 17 U/L (ref 0–44)
AST: 13 U/L — ABNORMAL LOW (ref 15–41)
Albumin: 3.8 g/dL (ref 3.5–5.0)
Alkaline Phosphatase: 141 U/L — ABNORMAL HIGH (ref 38–126)
Anion gap: 6 (ref 5–15)
BUN: 20 mg/dL (ref 8–23)
CO2: 27 mmol/L (ref 22–32)
Calcium: 9.2 mg/dL (ref 8.9–10.3)
Chloride: 107 mmol/L (ref 98–111)
Creatinine: 0.84 mg/dL (ref 0.44–1.00)
GFR, Estimated: >60 mL/min (ref >60)
Glucose, Bld: 163 mg/dL — ABNORMAL HIGH (ref 70–99)
Potassium: 3.7 mmol/L (ref 3.5–5.1)
Sodium: 140 mmol/L (ref 135–145)
Total Bilirubin: 0.3 mg/dL (ref 0.0–1.2)
Total Protein: 6.9 g/dL (ref 6.5–8.1)

## 2023-08-23 LAB — CBC WITH DIFFERENTIAL (CANCER CENTER ONLY)
Abs Immature Granulocytes: 0.02 K/uL (ref 0.00–0.07)
Basophils Absolute: 0 K/uL (ref 0.0–0.1)
Basophils Relative: 0 %
Eosinophils Absolute: 0.1 K/uL (ref 0.0–0.5)
Eosinophils Relative: 2 %
HCT: 34 % — ABNORMAL LOW (ref 36.0–46.0)
Hemoglobin: 11.1 g/dL — ABNORMAL LOW (ref 12.0–15.0)
Immature Granulocytes: 0 %
Lymphocytes Relative: 24 %
Lymphs Abs: 1.9 K/uL (ref 0.7–4.0)
MCH: 30.7 pg (ref 26.0–34.0)
MCHC: 32.6 g/dL (ref 30.0–36.0)
MCV: 94.2 fL (ref 80.0–100.0)
Monocytes Absolute: 0.4 K/uL (ref 0.1–1.0)
Monocytes Relative: 5 %
Neutro Abs: 5.3 K/uL (ref 1.7–7.7)
Neutrophils Relative %: 69 %
Platelet Count: 129 K/uL — ABNORMAL LOW (ref 150–400)
RBC: 3.61 MIL/uL — ABNORMAL LOW (ref 3.87–5.11)
RDW: 14.9 % (ref 11.5–15.5)
WBC Count: 7.8 K/uL (ref 4.0–10.5)
nRBC: 0 % (ref 0.0–0.2)

## 2023-08-23 LAB — LACTATE DEHYDROGENASE: LDH: 177 U/L (ref 98–192)

## 2023-08-23 NOTE — Progress Notes (Signed)
 [facility] [phone]   [phone]  PROGRESS NOTE  Patient Care Team: System, Provider Not In as PCP - General Shelda Atlas, MD (Internal Medicine)  Hematological/Oncological History # Hodkgin's Lymphoma, Stage III 12/09/2021: CT Abdomen/Pelvis showed retroperitoneal adenopathy, with 2.1 x 3.6 cm node 12/20/2021: establish care with [facility]  12/27/2021: CT abdominal mass biopsy performed, findings consistent with classical Hodgkin's lymphoma. 01/05/2022: PET CT scan showed hypermetabolic abdominal adenopathy consistent with lymphoma.  She has mild hypermetabolism involving the mediastinal and bilateral axillary nodes. 01/17/2022: Cycle 1, Day 1 of AVD chemotherapy 01/31/2022: Cycle 1, Day 15 of AVD chemotherapy 04/24/2021: Cycle 2 Day 15 of AVD chemotherapy 05/09/2022: Cycle 3, Day 1 of AVD chemotherapy 06/06/2022: Cycle 3, Day 15 of AVD chemotherapy 06/14/2022: Mid-treatment PET/CT scan: Improved thoracoabdominal lymphadenopathy (Deauville criteria 2-3).  06/20/2022: Cycle 4, Day 1 of AVD chemotherapy HELD due to right inguinal abscess.  07/05/2022: Cycle 4, Day 1 of AVD chemotherapy  07/19/2022: Cycle 4, Day 15 of AVD chemotherapy  08/02/2022: Cycle 5, Day 1 of AVD chemotherapy  08/16/2022: Cycle 5, Day 15 of AVD chemotherapy HELD due to Hgb 6.7 08/30/2022: Cycle 5, Day 15 of AVD chemotherapy  09/13/2022: Cycle 6, Day 1 of AVD chemotherapy 09/26/2022: Cycle 6, Day 15 of AVD chemotherapy HELD due to Hgb 7.9, Plt 66K 10/11/2022: Cycle 6, Day 15 of AVD chemotherapy 11/09/2022: Post treatment PET CT scan, Deauville 1. No evidence of residual/recurrent disease.   Interval History:  Heather Petty 73 y.o. female with medical history significant for newly diagnosed Hodgkin's lymphoma who presents for a follow up visit. The patient's last visit was on 05/24/2023. She presents today for routine follow up.   On exam today Heather Petty reports she has been  well overall since her last visit in April.  She does feel that she pinched a nerve in her left shoulder and is having some discomfort there.  She reports that there is a knot and she had a massage that helped to take it away.  She reports unfortunately it has been back and she reports he is going to try another massage.  She has a chronic cough with phlegm production but no runny nose or sore throat.  She notes that she thinks it may be due to the fact her medications cause some dry mouth.  She reports her weight has been increasing and she is up to 228 pounds from 225 pounds.  She reports her energy levels are good.  She denies any lightheadedness or dizziness but does have some shortness of breath on exertion.  She does not have any bumps or lumps concerning for lymphadenopathy.  Otherwise she has been her baseline level of health.  A full 10 point ROS is otherwise negative.  MEDICAL HISTORY:  Past Medical History:  Diagnosis Date   AKI (acute kidney injury) (HCC) 12/17/2017   Allergic rhinitis    Anemia    Arthritis    Asthma    Autonomic neuropathy    Depression    Diabetes mellitus    Diabetes mellitus without complication (HCC)    E coli infection    GERD (gastroesophageal reflux disease)    GERD (gastroesophageal reflux disease)    Gout    Hepatitis A    High cholesterol    Hypertension    Insomnia    Kidney failure    Metabolic encephalopathy    Morbid obesity (HCC)    NASH (nonalcoholic steatohepatitis)    Pneumonia 12/17/2017  Schizophrenia (HCC)    Stroke (HCC)    Vitamin B deficiency    Vitamin D  deficiency     SURGICAL HISTORY: Past Surgical History:  Procedure Laterality Date   CYST EXCISION     buttucks   DENTAL SURGERY     IR IMAGING GUIDED PORT INSERTION  01/10/2022   IR IMAGING GUIDED PORT INSERTION  04/21/2022   IR REMOVAL TUN ACCESS W/ PORT W/O FL MOD SED  02/28/2022   IR REMOVAL TUN ACCESS W/ PORT W/O FL MOD SED  02/02/2023   TONSILLECTOMY      SOCIAL  HISTORY: Social History   Socioeconomic History   Marital status: Unknown    Spouse name: Not on file   Number of children: 2   Years of education: Not on file   Highest education level: Not on file  Occupational History   Occupation: retired/disabled  Tobacco Use   Smoking status: Never   Smokeless tobacco: Never  Vaping Use   Vaping status: Never Used  Substance and Sexual Activity   Alcohol  use: Never   Drug use: Never   Sexual activity: Not Currently    Birth control/protection: None  Other Topics Concern   Not on file  Social History Narrative   ** Merged History Encounter **       Social Drivers of Health   Financial Resource Strain: Not on file  Food Insecurity: No Food Insecurity (05/20/2022)   Hunger Vital Sign    Worried About Running Out of Food in the Last Year: Never true    Ran Out of Food in the Last Year: Never true  Transportation Needs: No Transportation Needs (05/20/2022)   PRAPARE - Administrator, Civil Service (Medical): No    Lack of Transportation (Non-Medical): No  Physical Activity: Not on file  Stress: Not on file  Social Connections: Not on file  Intimate Partner Violence: Not At Risk (05/20/2022)   Humiliation, Afraid, Rape, and Kick questionnaire    Fear of Current or Ex-Partner: No    Emotionally Abused: No    Physically Abused: No    Sexually Abused: No    FAMILY HISTORY: Family History  Adopted: Yes  Problem Relation Age of Onset   COPD Daughter     ALLERGIES:  is allergic to vancomycin , emend [aprepitant], fosaprepitant , lasix  [furosemide ], lasix  [furosemide ], and pineapple.  MEDICATIONS:  Current Outpatient Medications  Medication Sig Dispense Refill   acetaminophen  (TYLENOL ) 500 MG tablet Take 1,000 mg by mouth every 6 (six) hours as needed (for pain- CANNOT EXCEED A TOTAL OF 3,000 MG/DAY FROM ALL COMBINED SOURCES).     albuterol  (VENTOLIN  HFA) 108 (90 Base) MCG/ACT inhaler Inhale 2 puffs into the lungs every 4  (four) hours as needed for wheezing or shortness of breath.     allopurinol  (ZYLOPRIM ) 100 MG tablet Take 100 mg by mouth daily.     Amino Acids -Protein Hydrolys (PRO-STAT) LIQD Take 30 mLs by mouth 2 (two) times daily between meals.     amLODipine  (NORVASC ) 10 MG tablet Take 10 mg by mouth daily.     ASPERCREME LIDOCAINE  4 % Place 1 patch onto the skin See admin instructions. Apply 1 patch to both knees once a day- remove as directed     calamine lotion Apply 1 Application topically 2 (two) times daily.  0   carvedilol  (COREG ) 6.25 MG tablet Take 6.25 mg by mouth 2 (two) times daily with a meal.     cholecalciferol  (VITAMIN D3)  25 MCG (1000 UNIT) tablet Take 2,000 Units by mouth daily.     doxepin  (SINEQUAN ) 25 MG capsule Take 25 mg by mouth at bedtime.     doxycycline  (VIBRA -TABS) 100 MG tablet Take 1 tablet (100 mg total) by mouth 2 (two) times daily. (Patient not taking: Reported on 11/17/2022) 14 tablet 0   Emollient (EUCERIN BABY EX) Apply 1 application  topically See admin instructions. Apply to affected areas daily where the skin is peeling     famotidine  (PEPCID ) 20 MG tablet Take 20 mg by mouth every 12 (twelve) hours.     fluticasone  (FLONASE ) 50 MCG/ACT nasal spray Place 1 spray into both nostrils daily.     glipiZIDE (GLUCOTROL XL) 10 MG 24 hr tablet Take 10 mg by mouth in the morning and at bedtime.     guaiFENesin  (MUCINEX ) 600 MG 12 hr tablet Take by mouth 2 (two) times daily as needed.     hydrOXYzine  (ATARAX /VISTARIL ) 25 MG tablet Take 25 mg by mouth in the morning and at bedtime.     insulin  aspart (NOVOLOG ) 100 UNIT/ML injection Inject 0-12 Units into the skin See admin instructions. Inject 0-12 units into the skin 4 times a day (before meals and at bedtime) per sliding scale: BGL <60 or >400  = CALL MD; 60-200 = 0 units; 201-250 = 2 units; 251-300 = 4 units; 301-350 = 6 units; 351-400 = 8 units; 401-450 = 10 units; >450 = 12 units     ipratropium-albuterol  (DUONEB) 0.5-2.5 (3)  MG/3ML SOLN Take 3 mLs by nebulization 3 (three) times daily. (Patient not taking: Reported on 11/17/2022)     LANTUS  SOLOSTAR 100 UNIT/ML Solostar Pen Inject 28-42 Units into the skin See admin instructions. Inject 42 units into the skin in the morning and 28 units at bedtime     lidocaine -prilocaine  (EMLA ) cream Apply 1 Application topically as needed. (Patient taking differently: Apply 1 Application topically See admin instructions. Apply to port site 1-2 hours prior to chemo- cover with saran wrap and secure with tape) 30 g 0   lisinopril  (ZESTRIL ) 5 MG tablet Take 5 mg by mouth daily.     loperamide  (IMODIUM ) 2 MG capsule Take 1 capsule (2 mg total) by mouth as needed for diarrhea or loose stools. 30 capsule 0   loratadine  (CLARITIN ) 10 MG tablet Take 10 mg by mouth daily.     Nutritional Supplements (BOOST GLUCOSE CONTROL) LIQD Take 237 mLs by mouth 2 (two) times daily between meals.     nystatin  (MYCOSTATIN /NYSTOP ) powder Apply topically 2 (two) times daily. (Patient not taking: Reported on 08/02/2022) 15 g 0   polyethylene glycol powder (GLYCOLAX /MIRALAX ) 17 GM/SCOOP powder Take 17 g by mouth daily as needed for mild constipation (to be mixed into 4-8 ounces of a beverage).     prochlorperazine  (COMPAZINE ) 10 MG tablet Take 1 tablet (10 mg total) by mouth every 6 (six) hours as needed for nausea or vomiting. (Patient not taking: Reported on 11/17/2022) 30 tablet 0   SEMGLEE , YFGN, 100 UNIT/ML injection Inject 24 Units into the skin at bedtime.     Simethicone  (GAS-X ULTRA STRENGTH) 180 MG CAPS Take 1 capsule by mouth every 8 (eight) hours as needed (gas).     SYSTANE BALANCE 0.6 % SOLN Place 1 drop into both eyes every 12 (twelve) hours.     No current facility-administered medications for this visit.   Facility-Administered Medications Ordered in Other Visits  Medication Dose Route Frequency Provider Last Rate Last  Admin   0.9 %  sodium chloride  infusion   Intravenous Continuous Allred,  Darrell K, PA-C        REVIEW OF SYSTEMS:   Constitutional: ( - ) fevers, ( - )  chills , ( - ) night sweats Eyes: ( - ) blurriness of vision, ( - ) double vision, ( - ) watery eyes Ears, nose, mouth, throat, and face: ( - ) mucositis, ( - ) sore throat Respiratory: ( - ) cough, ( - ) dyspnea, ( - ) wheezes Cardiovascular: ( - ) palpitation, ( - ) chest discomfort, ( - ) lower extremity swelling Gastrointestinal:  ( -) nausea, ( - ) heartburn, ( - ) change in bowel habits Skin: ( - ) abnormal skin rashes Lymphatics: ( - ) new lymphadenopathy, ( - ) easy bruising Neurological: ( - ) numbness, ( - ) tingling, ( - ) new weaknesses Behavioral/Psych: ( - ) mood change, ( - ) new changes  All other systems were reviewed with the patient and are negative.  PHYSICAL EXAMINATION: ECOG PERFORMANCE STATUS: 2 - Symptomatic, <50% confined to bed  Vitals:   08/23/23 1132  BP: 133/65  Pulse: (!) 58  Resp: 13  Temp: 97.7 F (36.5 C)  SpO2: 100%         Filed Weights   08/23/23 1132  Weight: 228 lb (103.4 kg)     GENERAL: Well-appearing elderly female, alert, no distress and comfortable SKIN: skin color, texture, turgor are normal, no rashes or significant lesions. Mild erythema over port site, no discharge or tenderness to palpation.  EYES: conjunctiva are pink and non-injected, sclera clear LUNGS: clear to auscultation and percussion with normal breathing effort HEART: regular rate & rhythm and no murmurs and no lower extremity edema Musculoskeletal: no cyanosis of digits and no clubbing  PSYCH: alert & oriented x 3, fluent speech NEURO: no focal motor/sensory deficits   LABORATORY DATA:  I have reviewed the data as listed    Latest Ref Rng & Units 08/23/2023   10:49 AM 05/24/2023   11:12 AM 01/19/2023   11:03 AM  CBC  WBC 4.0 - 10.5 K/uL 7.8  10.1  7.7   Hemoglobin 12.0 - 15.0 g/dL 88.8  87.9  89.6   Hematocrit 36.0 - 46.0 % 34.0  35.9  31.7   Platelets 150 - 400 K/uL 129   148  148        Latest Ref Rng & Units 08/23/2023   10:49 AM 05/24/2023   11:12 AM 05/10/2023   10:52 AM  CMP  Glucose 70 - 99 mg/dL 836  738    BUN 8 - 23 mg/dL 20  20    Creatinine 9.55 - 1.00 mg/dL 9.15  9.09  8.99   Sodium 135 - 145 mmol/L 140  137    Potassium 3.5 - 5.1 mmol/L 3.7  4.0    Chloride 98 - 111 mmol/L 107  102    CO2 22 - 32 mmol/L 27  26    Calcium  8.9 - 10.3 mg/dL 9.2  9.8    Total Protein 6.5 - 8.1 g/dL 6.9  7.7    Total Bilirubin 0.0 - 1.2 mg/dL 0.3  0.4    Alkaline Phos 38 - 126 U/L 141  162    AST 15 - 41 U/L 13  15    ALT 0 - 44 U/L 17  20      RADIOGRAPHIC STUDIES: No results found.   ASSESSMENT & PLAN  Heather Petty is a 73 y.o. female with medical history significant for newly diagnosed Hodgkin's lymphoma who presents for a follow up visit.   #Hodgkin's Lymphoma, Stage III --biopsy of abdominal lymph node on 12/27/2021 showed classical Hodgkin's lymphoma.  --Pre-treatment PET scan from [place]auville 5 abdominal adenopathy along with mild hypermetabolism involving mediastinal and bilateral axillary nodes. --Due to neutropenic fever, GCSF injection was added after Cycle 2, Day 15.  --Mid treatment PET scan from 06/14/2022 showed improved thoracoabdominal lymphadenopathy, Deauville score 2-3. Sleen is normal in size without focal lesion. PLAN: --patient completed 6 cycles of AVD chemotherapy.  --Labs from today show white blood cell 7.8, Hgb 11.1, MCV 94.2, Plt 129. Creatinine and LFTs are normal. --Posttreatment PET CT scan does not show any evidence of residual or recurrent disease. Last scan in April 2025 showed no evidence of residual/recurrent disease. Next scan due in Oct 2025.  --RTC in 3 months for a follow up with repeat CT scan.   Sacral superficial injury--improving: --Likely secondary to pressure versus excessive moisture. No open wound seen on previous physical exam --Patient reports staff at [facility] does apply ointment to affected  area.SABRA   #Sigmoid colonic focal hypermetabolism #Hepatic steatosis with possible cirrhosis --f/u with GI   #Supportive Care -- chemotherapy education complete -- port removed.  --baseline echo from 01/06/2022 showed EF 60-65% -- no pain medication required at this time.   Orders Placed This Encounter  Procedures   CT CHEST ABDOMEN PELVIS W CONTRAST    Standing Status:   Future    Expected Date:   11/12/2023    Expiration Date:   08/22/2024    If indicated for the ordered procedure, I authorize the administration of contrast media per Radiology protocol:   Yes    Does the patient have a contrast media/X-ray dye allergy?:   No    Preferred imaging location?:   [facility]    If indicated for the ordered procedure, I authorize the administration of oral contrast media per Radiology protocol:   Yes    All questions were answered. The patient knows to call the clinic with any problems, questions or concerns.  I have spent a total of 30 minutes minutes of face-to-face and non-face-to-face time, preparing to see the patient, performing a medically appropriate examination, counseling and educating the patient, ordering tests/procedures, communicating with other health care professionals, documenting clinical information in the electronic health record,  and care coordination.   Norleen IVAR Kidney, MD Department of Hematology/Oncology [facility] Phone: [phone] Pager: [phone] Email: [email or web address]  08/23/2023 3:00 PM

## 2023-08-24 ENCOUNTER — Other Ambulatory Visit: Payer: Self-pay

## 2023-09-07 ENCOUNTER — Other Ambulatory Visit: Payer: Self-pay

## 2023-11-12 ENCOUNTER — Ambulatory Visit: Payer: Self-pay | Admitting: *Deleted

## 2023-11-12 ENCOUNTER — Ambulatory Visit (HOSPITAL_COMMUNITY)
Admission: RE | Admit: 2023-11-12 | Discharge: 2023-11-12 | Disposition: A | Discharge status: Home / Self Care | Source: Ambulatory Visit | Attending: Hematology and Oncology | Admitting: Hematology and Oncology

## 2023-11-12 ENCOUNTER — Encounter (HOSPITAL_COMMUNITY): Payer: Self-pay

## 2023-11-12 DIAGNOSIS — E1310 Other specified diabetes mellitus with ketoacidosis without coma: Secondary | ICD-10-CM | POA: Diagnosis present

## 2023-11-12 DIAGNOSIS — C8198 Hodgkin lymphoma, unspecified, lymph nodes of multiple sites: Secondary | ICD-10-CM | POA: Diagnosis present

## 2023-11-12 LAB — POCT I-STAT CREATININE: Creatinine, Ser: 0.8 mg/dL (ref 0.44–1.00)

## 2023-11-12 MED ORDER — IOHEXOL 300 MG/ML  SOLN
100.0000 mL | Freq: Once | INTRAMUSCULAR | Status: AC | PRN
Start: 2023-11-12 — End: 2023-11-12
  Administered 2023-11-12: 100 mL via INTRAVENOUS

## 2023-11-12 MED ORDER — SODIUM CHLORIDE (PF) 0.9 % IJ SOLN
INTRAMUSCULAR | Status: AC
  Filled 2023-11-12: qty 50

## 2023-11-12 NOTE — Telephone Encounter (Signed)
 TCT patient regarding recent CT results. Spoke with her. Advised that her CT scan showed no evidence of residual or recurrent lymphoma. We will see her later this month as scheduled. Pt voiced understanding and she is aware of her next appts here on 11/28/23 @ 11:15

## 2023-11-12 NOTE — Telephone Encounter (Signed)
-----   Message from Norleen ONEIDA Kidney IV sent at 11/12/2023  2:40 PM EDT ----- Please let Mrs. Blankenship know that her CT scan showed no evidence of residual or recurrent lymphoma. We will see her later this month as scheduled. ----- Message ----- From: Interface, Rad Results In Sent: 11/12/2023   2:40 PM EDT To: Norleen ONEIDA Kidney [place], MD

## 2023-11-27 ENCOUNTER — Other Ambulatory Visit: Payer: Self-pay | Admitting: Hematology and Oncology

## 2023-11-27 DIAGNOSIS — C8198 Hodgkin lymphoma, unspecified, lymph nodes of multiple sites: Secondary | ICD-10-CM

## 2023-11-27 NOTE — Progress Notes (Unsigned)
 [facility] [phone]   [phone]  PROGRESS NOTE  Patient Care Team: System, Provider Not In as PCP - General Shelda Atlas, MD (Internal Medicine)  Hematological/Oncological History # Hodkgin's Lymphoma, Stage III 12/09/2021: CT Abdomen/Pelvis showed retroperitoneal adenopathy, with 2.1 x 3.6 cm node 12/20/2021: establish care with [facility]  12/27/2021: CT abdominal mass biopsy performed, findings consistent with classical Hodgkin's lymphoma. 01/05/2022: PET CT scan showed hypermetabolic abdominal adenopathy consistent with lymphoma.  She has mild hypermetabolism involving the mediastinal and bilateral axillary nodes. 01/17/2022: Cycle 1, Day 1 of AVD chemotherapy 01/31/2022: Cycle 1, Day 15 of AVD chemotherapy 04/24/2021: Cycle 2 Day 15 of AVD chemotherapy 05/09/2022: Cycle 3, Day 1 of AVD chemotherapy 06/06/2022: Cycle 3, Day 15 of AVD chemotherapy 06/14/2022: Mid-treatment PET/CT scan: Improved thoracoabdominal lymphadenopathy (Deauville criteria 2-3).  06/20/2022: Cycle 4, Day 1 of AVD chemotherapy HELD due to right inguinal abscess.  07/05/2022: Cycle 4, Day 1 of AVD chemotherapy  07/19/2022: Cycle 4, Day 15 of AVD chemotherapy  08/02/2022: Cycle 5, Day 1 of AVD chemotherapy  08/16/2022: Cycle 5, Day 15 of AVD chemotherapy HELD due to Hgb 6.7 08/30/2022: Cycle 5, Day 15 of AVD chemotherapy  09/13/2022: Cycle 6, Day 1 of AVD chemotherapy 09/26/2022: Cycle 6, Day 15 of AVD chemotherapy HELD due to Hgb 7.9, Plt 66K 10/11/2022: Cycle 6, Day 15 of AVD chemotherapy 11/09/2022: Post treatment PET CT scan, Deauville 1. No evidence of residual/recurrent disease.   Interval History:  Heather Petty 73 y.o. female with medical history significant for newly diagnosed Hodgkin's lymphoma who presents for a follow up visit. The patient's last visit was on 08/23/2023. She presents today for routine follow up.   On exam today Heather Petty reports she has been  well overall in the interim since our last visit.  She reports unfortunately her sister was recently diagnosed with pancreatic cancer.  She notes that today she is feeling okay.  She notes that she did eat something that disagreed with her and she had nausea and vomiting for about 2 days.  She has not had any diarrhea.  She reports that she has not any bumps or lumps concerning for lymphadenopathy.  She denies any runny nose, sore throat, cough.  She is having no fevers, chills, sweats.  She reports her appetite levels are too good.  She notes that her weight continues to increase and her last measurement at her facility was 231.6 pounds.  Overall she feels well and has no additional questions concerns or complaints.  Full 10 point ROS is otherwise negative.  MEDICAL HISTORY:  Past Medical History:  Diagnosis Date   AKI (acute kidney injury) 12/17/2017   Allergic rhinitis    Anemia    Arthritis    Asthma    Autonomic neuropathy    Depression    Diabetes mellitus    Diabetes mellitus without complication (HCC)    E coli infection    GERD (gastroesophageal reflux disease)    GERD (gastroesophageal reflux disease)    Gout    Hepatitis A    High cholesterol    Hypertension    Insomnia    Kidney failure    Metabolic encephalopathy    Morbid obesity (HCC)    NASH (nonalcoholic steatohepatitis)    Pneumonia 12/17/2017   Schizophrenia (HCC)    Stroke (HCC)    Vitamin B deficiency    Vitamin D  deficiency     SURGICAL HISTORY: Past Surgical History:  Procedure Laterality Date  CYST EXCISION     buttucks   DENTAL SURGERY     IR IMAGING GUIDED PORT INSERTION  01/10/2022   IR IMAGING GUIDED PORT INSERTION  04/21/2022   IR REMOVAL TUN ACCESS W/ PORT W/O FL MOD SED  02/28/2022   IR REMOVAL TUN ACCESS W/ PORT W/O FL MOD SED  02/02/2023   TONSILLECTOMY      SOCIAL HISTORY: Social History   Socioeconomic History   Marital status: Unknown    Spouse name: Not on file   Number of  children: 2   Years of education: Not on file   Highest education level: Not on file  Occupational History   Occupation: retired/disabled  Tobacco Use   Smoking status: Never   Smokeless tobacco: Never  Vaping Use   Vaping status: Never Used  Substance and Sexual Activity   Alcohol  use: Never   Drug use: Never   Sexual activity: Not Currently    Birth control/protection: None  Other Topics Concern   Not on file  Social History Narrative   ** Merged History Encounter **       Social Drivers of Health   Financial Resource Strain: Not on file  Food Insecurity: No Food Insecurity (05/20/2022)   Hunger Vital Sign    Worried About Running Out of Food in the Last Year: Never true    Ran Out of Food in the Last Year: Never true  Transportation Needs: No Transportation Needs (05/20/2022)   PRAPARE - Administrator, Civil Service (Medical): No    Lack of Transportation (Non-Medical): No  Physical Activity: Not on file  Stress: Not on file  Social Connections: Not on file  Intimate Partner Violence: Not At Risk (05/20/2022)   Humiliation, Afraid, Rape, and Kick questionnaire    Fear of Current or Ex-Partner: No    Emotionally Abused: No    Physically Abused: No    Sexually Abused: No    FAMILY HISTORY: Family History  Adopted: Yes  Problem Relation Age of Onset   COPD Daughter     ALLERGIES:  is allergic to vancomycin , emend [aprepitant], fosaprepitant , lasix  [furosemide ], lasix  [furosemide ], and pineapple.  MEDICATIONS:  Current Outpatient Medications  Medication Sig Dispense Refill   acetaminophen  (TYLENOL ) 500 MG tablet Take 1,000 mg by mouth every 6 (six) hours as needed (for pain- CANNOT EXCEED A TOTAL OF 3,000 MG/DAY FROM ALL COMBINED SOURCES).     albuterol  (VENTOLIN  HFA) 108 (90 Base) MCG/ACT inhaler Inhale 2 puffs into the lungs every 4 (four) hours as needed for wheezing or shortness of breath.     allopurinol  (ZYLOPRIM ) 100 MG tablet Take 100 mg by  mouth daily.     Amino Acids -Protein Hydrolys (PRO-STAT) LIQD Take 30 mLs by mouth 2 (two) times daily between meals.     amLODipine  (NORVASC ) 10 MG tablet Take 10 mg by mouth daily.     ASPERCREME LIDOCAINE  4 % Place 1 patch onto the skin See admin instructions. Apply 1 patch to both knees once a day- remove as directed     calamine lotion Apply 1 Application topically 2 (two) times daily.  0   carvedilol  (COREG ) 6.25 MG tablet Take 6.25 mg by mouth 2 (two) times daily with a meal.     cholecalciferol  (VITAMIN D3) 25 MCG (1000 UNIT) tablet Take 2,000 Units by mouth daily.     doxepin  (SINEQUAN ) 25 MG capsule Take 25 mg by mouth at bedtime.     doxycycline  (VIBRA -TABS) 100  MG tablet Take 1 tablet (100 mg total) by mouth 2 (two) times daily. (Patient not taking: Reported on 11/17/2022) 14 tablet 0   Emollient (EUCERIN BABY EX) Apply 1 application  topically See admin instructions. Apply to affected areas daily where the skin is peeling     famotidine  (PEPCID ) 20 MG tablet Take 20 mg by mouth every 12 (twelve) hours.     fluticasone  (FLONASE ) 50 MCG/ACT nasal spray Place 1 spray into both nostrils daily.     glipiZIDE (GLUCOTROL XL) 10 MG 24 hr tablet Take 10 mg by mouth in the morning and at bedtime.     guaiFENesin  (MUCINEX ) 600 MG 12 hr tablet Take by mouth 2 (two) times daily as needed.     hydrOXYzine  (ATARAX /VISTARIL ) 25 MG tablet Take 25 mg by mouth in the morning and at bedtime.     insulin  aspart (NOVOLOG ) 100 UNIT/ML injection Inject 0-12 Units into the skin See admin instructions. Inject 0-12 units into the skin 4 times a day (before meals and at bedtime) per sliding scale: BGL <60 or >400  = CALL MD; 60-200 = 0 units; 201-250 = 2 units; 251-300 = 4 units; 301-350 = 6 units; 351-400 = 8 units; 401-450 = 10 units; >450 = 12 units     ipratropium-albuterol  (DUONEB) 0.5-2.5 (3) MG/3ML SOLN Take 3 mLs by nebulization 3 (three) times daily. (Patient not taking: Reported on 11/17/2022)     LANTUS   SOLOSTAR 100 UNIT/ML Solostar Pen Inject 28-42 Units into the skin See admin instructions. Inject 42 units into the skin in the morning and 28 units at bedtime     lidocaine -prilocaine  (EMLA ) cream Apply 1 Application topically as needed. (Patient taking differently: Apply 1 Application topically See admin instructions. Apply to port site 1-2 hours prior to chemo- cover with saran wrap and secure with tape) 30 g 0   lisinopril  (ZESTRIL ) 5 MG tablet Take 5 mg by mouth daily.     loperamide  (IMODIUM ) 2 MG capsule Take 1 capsule (2 mg total) by mouth as needed for diarrhea or loose stools. 30 capsule 0   loratadine  (CLARITIN ) 10 MG tablet Take 10 mg by mouth daily.     Nutritional Supplements (BOOST GLUCOSE CONTROL) LIQD Take 237 mLs by mouth 2 (two) times daily between meals.     nystatin  (MYCOSTATIN /NYSTOP ) powder Apply topically 2 (two) times daily. (Patient not taking: Reported on 08/02/2022) 15 g 0   polyethylene glycol powder (GLYCOLAX /MIRALAX ) 17 GM/SCOOP powder Take 17 g by mouth daily as needed for mild constipation (to be mixed into 4-8 ounces of a beverage).     prochlorperazine  (COMPAZINE ) 10 MG tablet Take 1 tablet (10 mg total) by mouth every 6 (six) hours as needed for nausea or vomiting. (Patient not taking: Reported on 11/17/2022) 30 tablet 0   SEMGLEE , YFGN, 100 UNIT/ML injection Inject 24 Units into the skin at bedtime.     Simethicone  (GAS-X ULTRA STRENGTH) 180 MG CAPS Take 1 capsule by mouth every 8 (eight) hours as needed (gas).     SYSTANE BALANCE 0.6 % SOLN Place 1 drop into both eyes every 12 (twelve) hours.     No current facility-administered medications for this visit.   Facility-Administered Medications Ordered in Other Visits  Medication Dose Route Frequency Provider Last Rate Last Admin   0.9 %  sodium chloride  infusion   Intravenous Continuous Allred, Darrell K, PA-C        REVIEW OF SYSTEMS:   Constitutional: ( - ) fevers, ( - )  chills , ( - ) night sweats Eyes: (  - ) blurriness of vision, ( - ) double vision, ( - ) watery eyes Ears, nose, mouth, throat, and face: ( - ) mucositis, ( - ) sore throat Respiratory: ( - ) cough, ( - ) dyspnea, ( - ) wheezes Cardiovascular: ( - ) palpitation, ( - ) chest discomfort, ( - ) lower extremity swelling Gastrointestinal:  ( -) nausea, ( - ) heartburn, ( - ) change in bowel habits Skin: ( - ) abnormal skin rashes Lymphatics: ( - ) new lymphadenopathy, ( - ) easy bruising Neurological: ( - ) numbness, ( - ) tingling, ( - ) new weaknesses Behavioral/Psych: ( - ) mood change, ( - ) new changes  All other systems were reviewed with the patient and are negative.  PHYSICAL EXAMINATION: ECOG PERFORMANCE STATUS: 2 - Symptomatic, <50% confined to bed  Vitals:   11/28/23 1148  BP: (!) 141/65  Pulse: 65  Resp: 13  Temp: 97.9 F (36.6 C)  SpO2: 100%          There were no vitals filed for this visit.    GENERAL: Well-appearing elderly female, alert, no distress and comfortable SKIN: skin color, texture, turgor are normal, no rashes or significant lesions. Mild erythema over port site, no discharge or tenderness to palpation.  EYES: conjunctiva are pink and non-injected, sclera clear LUNGS: clear to auscultation and percussion with normal breathing effort HEART: regular rate & rhythm and no murmurs and no lower extremity edema Musculoskeletal: no cyanosis of digits and no clubbing  PSYCH: alert & oriented x 3, fluent speech NEURO: no focal motor/sensory deficits   LABORATORY DATA:  I have reviewed the data as listed    Latest Ref Rng & Units 11/28/2023   11:20 AM 08/23/2023   10:49 AM 05/24/2023   11:12 AM  CBC  WBC 4.0 - 10.5 K/uL 10.3  7.8  10.1   Hemoglobin 12.0 - 15.0 g/dL 87.8  88.8  87.9   Hematocrit 36.0 - 46.0 % 36.6  34.0  35.9   Platelets 150 - 400 K/uL 131  129  148        Latest Ref Rng & Units 11/28/2023   11:20 AM 11/12/2023   12:24 PM 08/23/2023   10:49 AM  CMP  Glucose 70 - 99  mg/dL 819   836   BUN 8 - 23 mg/dL 17   20   Creatinine 9.55 - 1.00 mg/dL 9.17  9.19  9.15   Sodium 135 - 145 mmol/L 141   140   Potassium 3.5 - 5.1 mmol/L 4.0   3.7   Chloride 98 - 111 mmol/L 105   107   CO2 22 - 32 mmol/L 28   27   Calcium  8.9 - 10.3 mg/dL 89.8   9.2   Total Protein 6.5 - 8.1 g/dL 7.5   6.9   Total Bilirubin 0.0 - 1.2 mg/dL 0.5   0.3   Alkaline Phos 38 - 126 U/L 166   141   AST 15 - 41 U/L 16   13   ALT 0 - 44 U/L 14   17     RADIOGRAPHIC STUDIES: CT CHEST ABDOMEN PELVIS W CONTRAST Result Date: 11/12/2023 CLINICAL DATA:  Hematologic malignancy, monitor. * Tracking Code: BO * EXAM: CT CHEST, ABDOMEN, AND PELVIS WITH CONTRAST TECHNIQUE: Multidetector CT imaging of the chest, abdomen and pelvis was performed following the standard protocol during bolus administration of intravenous contrast.  RADIATION DOSE REDUCTION: This exam was performed according to the departmental dose-optimization program which includes automated exposure control, adjustment of the mA and/or kV according to patient size and/or use of iterative reconstruction technique. CONTRAST:  OMNIPAQUE  IOHEXOL  300 MG/ML  SOLN COMPARISON:  Multiple priors including CT May 10, 2023 FINDINGS: CT CHEST FINDINGS Cardiovascular: Aortic atherosclerosis. Normal caliber thoracic aorta. Normal size heart. Coronary artery calcifications. Mediastinum/Nodes: No suspicious thyroid  nodule. Prominent right axillary/subpectoral, left axillary and mediastinal lymph nodes are similar to prior examination. Not pathologically enlarged by size criteria. Lungs/Pleura: Stable 4 mm right middle lobe subpleural pulmonary nodule on image 91/4. Scattered atelectasis/scarring. Musculoskeletal: No suspicious pulmonary nodules or masses. Thoracic spondylosis. Degenerative change of the bilateral shoulders. CT ABDOMEN PELVIS FINDINGS Hepatobiliary: No suspicious hepatic lesion. Cholelithiasis. No biliary ductal dilation. Pancreas: No pancreatic  ductal dilation or evidence of acute inflammation. Spleen: No splenomegaly. Adrenals/Urinary Tract: No suspicious adrenal nodule/mass. No hydronephrosis. Nonobstructive 2 mm right interpolar renal stone. Urinary bladder is unremarkable for degree of distension. Stomach/Bowel: No evidence of bowel obstruction. Colonic diverticulosis. Noninflamed appendix. Vascular/Lymphatic: Aortic atherosclerosis. Normal caliber abdominal aorta. Smooth IVC contours. The portal, splenic and superior mesenteric veins are patent. Stable prominent comment non pathologically enlarged by size criteria abdominopelvic lymph nodes. Reproductive: Uterus and bilateral adnexa are unremarkable. Other: No significant abdominopelvic free fluid. Musculoskeletal: No aggressive lytic or blastic lesion of bone. Lumbar spondylosis. IMPRESSION: 1. No pathologically enlarged lymph nodes in the chest, abdomen and pelvis. No splenomegaly. 2. Stable 4 mm right middle lobe subpleural pulmonary nodule. 3. Cholelithiasis. 4. Nonobstructive 2 mm right interpolar renal stone. 5. Colonic diverticulosis without evidence of acute diverticulitis. 6.  Aortic Atherosclerosis (ICD10-I70.0). Electronically Signed   By: Reyes Holder M.D.   On: 11/12/2023 14:37     ASSESSMENT & PLAN Heather Petty is a 72 y.o. female with medical history significant for newly diagnosed Hodgkin's lymphoma who presents for a follow up visit.   #Hodgkin's Lymphoma, Stage III --biopsy of abdominal lymph node on 12/27/2021 showed classical Hodgkin's lymphoma.  --Pre-treatment PET scan from Deauville 5 abdominal adenopathy along with mild hypermetabolism involving mediastinal and bilateral axillary nodes. --Due to neutropenic fever, GCSF injection was added after Cycle 2, Day 15.  --Mid treatment PET scan from 06/14/2022 showed improved thoracoabdominal lymphadenopathy, Deauville score 2-3. Sleen is normal in size without focal lesion. PLAN: --patient completed 6 cycles of AVD  chemotherapy.  --Labs from today show white blood cell 10.3, hemoglobin 12.1, MCV 94.6, platelets 131. Creatinine and LFTs are normal. --Posttreatment PET CT scan does not show any evidence of residual or recurrent disease. Last scan in Oct 2025 showed no evidence of residual/recurrent disease. Next scan due in April 2026   --RTC in 3 months for a follow up with repeat CT scan in 6 months.    Sacral superficial injury--improving: --Likely secondary to pressure versus excessive moisture. No open wound seen on previous physical exam --Patient reports staff at [facility] does apply ointment to affected area.SABRA   #Sigmoid colonic focal hypermetabolism #Hepatic steatosis with possible cirrhosis --f/u with GI   #Supportive Care -- chemotherapy education complete -- port removed.  --baseline echo from 01/06/2022 showed EF 60-65% -- no pain medication required at this time.   No orders of the defined types were placed in this encounter.   All questions were answered. The patient knows to call the clinic with any problems, questions or concerns.  I have spent a total of 30 minutes minutes of face-to-face and  non-face-to-face time, preparing to see the patient, performing a medically appropriate examination, counseling and educating the patient, ordering tests/procedures, communicating with other health care professionals, documenting clinical information in the electronic health record,  and care coordination.   Norleen IVAR Kidney, MD Department of Hematology/Oncology [facility] Phone: [phone] Pager: [phone] Email: [email or web address]  11/28/2023 2:31 PM

## 2023-11-28 ENCOUNTER — Inpatient Hospital Stay: Admitting: Hematology and Oncology

## 2023-11-28 ENCOUNTER — Inpatient Hospital Stay: Attending: Hematology and Oncology

## 2023-11-28 VITALS — BP 141/65 | HR 65 | Temp 97.9°F | Resp 13

## 2023-11-28 DIAGNOSIS — D539 Nutritional anemia, unspecified: Secondary | ICD-10-CM | POA: Diagnosis not present

## 2023-11-28 DIAGNOSIS — C817A Other Hodgkin lymphoma, in remission: Secondary | ICD-10-CM | POA: Insufficient documentation

## 2023-11-28 DIAGNOSIS — C8198 Hodgkin lymphoma, unspecified, lymph nodes of multiple sites: Secondary | ICD-10-CM

## 2023-11-28 LAB — CMP (CANCER CENTER ONLY)
ALT: 14 U/L (ref 0–44)
AST: 16 U/L (ref 15–41)
Albumin: 4.2 g/dL (ref 3.5–5.0)
Alkaline Phosphatase: 166 U/L — ABNORMAL HIGH (ref 38–126)
Anion gap: 8 (ref 5–15)
BUN: 17 mg/dL (ref 8–23)
CO2: 28 mmol/L (ref 22–32)
Calcium: 10.1 mg/dL (ref 8.9–10.3)
Chloride: 105 mmol/L (ref 98–111)
Creatinine: 0.82 mg/dL (ref 0.44–1.00)
GFR, Estimated: >60 mL/min (ref >60)
Glucose, Bld: 180 mg/dL — ABNORMAL HIGH (ref 70–99)
Potassium: 4 mmol/L (ref 3.5–5.1)
Sodium: 141 mmol/L (ref 135–145)
Total Bilirubin: 0.5 mg/dL (ref 0.0–1.2)
Total Protein: 7.5 g/dL (ref 6.5–8.1)

## 2023-11-28 LAB — CBC WITH DIFFERENTIAL (CANCER CENTER ONLY)
Abs Immature Granulocytes: 0.03 K/uL (ref 0.00–0.07)
Basophils Absolute: 0 K/uL (ref 0.0–0.1)
Basophils Relative: 0 %
Eosinophils Absolute: 0.1 K/uL (ref 0.0–0.5)
Eosinophils Relative: 1 %
HCT: 36.6 % (ref 36.0–46.0)
Hemoglobin: 12.1 g/dL (ref 12.0–15.0)
Immature Granulocytes: 0 %
Lymphocytes Relative: 22 %
Lymphs Abs: 2.3 K/uL (ref 0.7–4.0)
MCH: 31.3 pg (ref 26.0–34.0)
MCHC: 33.1 g/dL (ref 30.0–36.0)
MCV: 94.6 fL (ref 80.0–100.0)
Monocytes Absolute: 0.5 K/uL (ref 0.1–1.0)
Monocytes Relative: 5 %
Neutro Abs: 7.4 K/uL (ref 1.7–7.7)
Neutrophils Relative %: 72 %
Platelet Count: 131 K/uL — ABNORMAL LOW (ref 150–400)
RBC: 3.87 MIL/uL (ref 3.87–5.11)
RDW: 14.8 % (ref 11.5–15.5)
WBC Count: 10.3 K/uL (ref 4.0–10.5)
nRBC: 0 % (ref 0.0–0.2)

## 2023-11-28 LAB — LACTATE DEHYDROGENASE: LDH: 170 U/L (ref 98–192)

## 2023-11-29 ENCOUNTER — Telehealth: Payer: Self-pay | Admitting: Hematology and Oncology

## 2023-11-29 NOTE — Telephone Encounter (Signed)
 Scheduled patient for next appointments. Called and spoke with the patient, she is aware.

## 2023-11-30 ENCOUNTER — Other Ambulatory Visit: Payer: Self-pay

## 2024-02-22 ENCOUNTER — Emergency Department (HOSPITAL_COMMUNITY)

## 2024-02-22 ENCOUNTER — Inpatient Hospital Stay (HOSPITAL_COMMUNITY)
Admission: EM | Admit: 2024-02-22 | Discharge: 2024-02-27 | DRG: 418 | Disposition: A | Discharge status: Skilled Nursing Facility | Attending: Internal Medicine | Admitting: Internal Medicine

## 2024-02-22 ENCOUNTER — Other Ambulatory Visit: Payer: Self-pay

## 2024-02-22 DIAGNOSIS — Z79899 Other long term (current) drug therapy: Secondary | ICD-10-CM

## 2024-02-22 DIAGNOSIS — E66813 Obesity, class 3: Secondary | ICD-10-CM | POA: Diagnosis present

## 2024-02-22 DIAGNOSIS — E872 Acidosis, unspecified: Secondary | ICD-10-CM | POA: Diagnosis present

## 2024-02-22 DIAGNOSIS — K8020 Calculus of gallbladder without cholecystitis without obstruction: Secondary | ICD-10-CM

## 2024-02-22 DIAGNOSIS — E1169 Type 2 diabetes mellitus with other specified complication: Secondary | ICD-10-CM | POA: Diagnosis present

## 2024-02-22 DIAGNOSIS — K219 Gastro-esophageal reflux disease without esophagitis: Secondary | ICD-10-CM | POA: Diagnosis present

## 2024-02-22 DIAGNOSIS — Z1152 Encounter for screening for COVID-19: Secondary | ICD-10-CM

## 2024-02-22 DIAGNOSIS — J45909 Unspecified asthma, uncomplicated: Secondary | ICD-10-CM | POA: Diagnosis present

## 2024-02-22 DIAGNOSIS — R7401 Elevation of levels of liver transaminase levels: Secondary | ICD-10-CM | POA: Insufficient documentation

## 2024-02-22 DIAGNOSIS — K8000 Calculus of gallbladder with acute cholecystitis without obstruction: Secondary | ICD-10-CM | POA: Diagnosis present

## 2024-02-22 DIAGNOSIS — E119 Type 2 diabetes mellitus without complications: Secondary | ICD-10-CM

## 2024-02-22 DIAGNOSIS — Z881 Allergy status to other antibiotic agents status: Secondary | ICD-10-CM

## 2024-02-22 DIAGNOSIS — G4733 Obstructive sleep apnea (adult) (pediatric): Secondary | ICD-10-CM | POA: Diagnosis present

## 2024-02-22 DIAGNOSIS — K851 Biliary acute pancreatitis without necrosis or infection: Principal | ICD-10-CM | POA: Diagnosis present

## 2024-02-22 DIAGNOSIS — Z9221 Personal history of antineoplastic chemotherapy: Secondary | ICD-10-CM

## 2024-02-22 DIAGNOSIS — Z794 Long term (current) use of insulin: Secondary | ICD-10-CM

## 2024-02-22 DIAGNOSIS — Z8572 Personal history of non-Hodgkin lymphomas: Secondary | ICD-10-CM

## 2024-02-22 DIAGNOSIS — K5730 Diverticulosis of large intestine without perforation or abscess without bleeding: Secondary | ICD-10-CM | POA: Diagnosis present

## 2024-02-22 DIAGNOSIS — E1165 Type 2 diabetes mellitus with hyperglycemia: Secondary | ICD-10-CM | POA: Diagnosis present

## 2024-02-22 DIAGNOSIS — Z87891 Personal history of nicotine dependence: Secondary | ICD-10-CM

## 2024-02-22 DIAGNOSIS — G4700 Insomnia, unspecified: Secondary | ICD-10-CM | POA: Diagnosis present

## 2024-02-22 DIAGNOSIS — Z91018 Allergy to other foods: Secondary | ICD-10-CM

## 2024-02-22 DIAGNOSIS — Z880 Allergy status to penicillin: Secondary | ICD-10-CM

## 2024-02-22 DIAGNOSIS — K7581 Nonalcoholic steatohepatitis (NASH): Secondary | ICD-10-CM | POA: Diagnosis present

## 2024-02-22 DIAGNOSIS — I252 Old myocardial infarction: Secondary | ICD-10-CM

## 2024-02-22 DIAGNOSIS — E7800 Pure hypercholesterolemia, unspecified: Secondary | ICD-10-CM | POA: Diagnosis present

## 2024-02-22 DIAGNOSIS — Z993 Dependence on wheelchair: Secondary | ICD-10-CM

## 2024-02-22 DIAGNOSIS — Z888 Allergy status to other drugs, medicaments and biological substances status: Secondary | ICD-10-CM

## 2024-02-22 DIAGNOSIS — L98429 Non-pressure chronic ulcer of back with unspecified severity: Secondary | ICD-10-CM | POA: Insufficient documentation

## 2024-02-22 DIAGNOSIS — I700 Atherosclerosis of aorta: Secondary | ICD-10-CM | POA: Diagnosis present

## 2024-02-22 DIAGNOSIS — Z825 Family history of asthma and other chronic lower respiratory diseases: Secondary | ICD-10-CM

## 2024-02-22 DIAGNOSIS — I10 Essential (primary) hypertension: Secondary | ICD-10-CM | POA: Diagnosis present

## 2024-02-22 DIAGNOSIS — F32A Depression, unspecified: Secondary | ICD-10-CM | POA: Diagnosis present

## 2024-02-22 DIAGNOSIS — M1990 Unspecified osteoarthritis, unspecified site: Secondary | ICD-10-CM | POA: Diagnosis present

## 2024-02-22 DIAGNOSIS — Z8673 Personal history of transient ischemic attack (TIA), and cerebral infarction without residual deficits: Secondary | ICD-10-CM

## 2024-02-22 DIAGNOSIS — Z6841 Body Mass Index (BMI) 40.0 and over, adult: Secondary | ICD-10-CM

## 2024-02-22 DIAGNOSIS — Z8709 Personal history of other diseases of the respiratory system: Secondary | ICD-10-CM

## 2024-02-22 DIAGNOSIS — F209 Schizophrenia, unspecified: Secondary | ICD-10-CM | POA: Diagnosis present

## 2024-02-22 DIAGNOSIS — E1129 Type 2 diabetes mellitus with other diabetic kidney complication: Principal | ICD-10-CM

## 2024-02-22 DIAGNOSIS — D649 Anemia, unspecified: Secondary | ICD-10-CM | POA: Diagnosis present

## 2024-02-22 DIAGNOSIS — C8190 Hodgkin lymphoma, unspecified, unspecified site: Secondary | ICD-10-CM | POA: Diagnosis present

## 2024-02-22 DIAGNOSIS — Z7984 Long term (current) use of oral hypoglycemic drugs: Secondary | ICD-10-CM

## 2024-02-22 LAB — COMPREHENSIVE METABOLIC PANEL WITH GFR
ALT: 209 U/L — ABNORMAL HIGH (ref 0–44)
AST: 321 U/L — ABNORMAL HIGH (ref 15–41)
Albumin: 3.9 g/dL (ref 3.5–5.0)
Alkaline Phosphatase: 240 U/L — ABNORMAL HIGH (ref 38–126)
Anion gap: 15 (ref 5–15)
BUN: 13 mg/dL (ref 8–23)
CO2: 22 mmol/L (ref 22–32)
Calcium: 9.7 mg/dL (ref 8.9–10.3)
Chloride: 102 mmol/L (ref 98–111)
Creatinine, Ser: 0.71 mg/dL (ref 0.44–1.00)
GFR, Estimated: >60 mL/min
Glucose, Bld: 220 mg/dL — ABNORMAL HIGH (ref 70–99)
Potassium: 4.2 mmol/L (ref 3.5–5.1)
Sodium: 138 mmol/L (ref 135–145)
Total Bilirubin: 1.9 mg/dL — ABNORMAL HIGH (ref 0.0–1.2)
Total Protein: 7.3 g/dL (ref 6.5–8.1)

## 2024-02-22 LAB — I-STAT CHEM 8, ED
BUN: 15 mg/dL (ref 8–23)
Calcium, Ion: 1.12 mmol/L — ABNORMAL LOW (ref 1.15–1.40)
Chloride: 100 mmol/L (ref 98–111)
Creatinine, Ser: 0.6 mg/dL (ref 0.44–1.00)
Glucose, Bld: 225 mg/dL — ABNORMAL HIGH (ref 70–99)
HCT: 44 % (ref 36.0–46.0)
Hemoglobin: 15 g/dL (ref 12.0–15.0)
Potassium: 4 mmol/L (ref 3.5–5.1)
Sodium: 140 mmol/L (ref 135–145)
TCO2: 27 mmol/L (ref 22–32)

## 2024-02-22 LAB — CBC
HCT: 40.4 % (ref 36.0–46.0)
Hemoglobin: 13.6 g/dL (ref 12.0–15.0)
MCH: 32.3 pg (ref 26.0–34.0)
MCHC: 33.7 g/dL (ref 30.0–36.0)
MCV: 96 fL (ref 80.0–100.0)
Platelets: 153 K/uL (ref 150–400)
RBC: 4.21 MIL/uL (ref 3.87–5.11)
RDW: 15.9 % — ABNORMAL HIGH (ref 11.5–15.5)
WBC: 9.5 K/uL (ref 4.0–10.5)
nRBC: 0 % (ref 0.0–0.2)

## 2024-02-22 LAB — RESP PANEL BY RT-PCR (RSV, FLU A&B, COVID)  RVPGX2
Influenza A by PCR: NEGATIVE
Influenza B by PCR: NEGATIVE
Resp Syncytial Virus by PCR: NEGATIVE
SARS Coronavirus 2 by RT PCR: NEGATIVE

## 2024-02-22 LAB — LIPASE, BLOOD: Lipase: >2800 U/L — ABNORMAL HIGH (ref 11–51)

## 2024-02-22 MED ORDER — HYDROMORPHONE HCL 1 MG/ML IJ SOLN
0.5000 mg | Freq: Once | INTRAMUSCULAR | Status: AC
  Administered 2024-02-22: 0.5 mg via INTRAVENOUS
  Filled 2024-02-22: qty 1

## 2024-02-22 MED ORDER — IOHEXOL 350 MG/ML SOLN
80.0000 mL | Freq: Once | INTRAVENOUS | Status: AC | PRN
  Administered 2024-02-22: 80 mL via INTRAVENOUS

## 2024-02-22 MED ORDER — LACTATED RINGERS IV BOLUS
1000.0000 mL | Freq: Once | INTRAVENOUS | Status: AC
  Administered 2024-02-22: 1000 mL via INTRAVENOUS

## 2024-02-22 MED ORDER — ONDANSETRON HCL 4 MG/2ML IJ SOLN: 4.0000 mg | Freq: Once | INTRAMUSCULAR | Status: DC

## 2024-02-22 NOTE — ED Provider Triage Note (Signed)
 Emergency Medicine Provider Triage Evaluation Note  Heather Petty , a 74 y.o. female  was evaluated in triage.  Pt complains of abdominal pain since this morning, has been vomiting and also has had a fever.  Had 2 regular bowel movements today.  No diarrhea.  Denies known urinary symptoms.  Also appreciates a cough, body aches, chills, as well as a headache.  Past medical history significant for type 2 diabetes, hypertension, obesity, AKI, hyperlipidemia, GERD, sepsis, pressure ulcer of sacral region, rhabdo, encephalopathy, etc.  Review of Systems  Positive: Abdominal pain, nausea, fever, body aches, chills, shortness of breath Negative: Chest pain, urinary symptoms, diarrhea  Physical Exam  BP (!) 186/63   Pulse 85   Temp 99.8 F (37.7 C) (Oral)   Resp 20   Ht 5' 2 (1.575 m)   Wt 102.1 kg   SpO2 98%   BMI 41.15 kg/m  Gen:   Awake, no distress, uncomfortable appearing Resp:  Normal effort, patient talking in full sentences on room air MSK:   Moves extremities without difficulty  Other:  Abdomen generally tender worse around periumbilical region  Medical Decision Making  Medically screening exam initiated at 8:58 PM.  Appropriate orders placed.  Heather Petty was informed that the remainder of the evaluation will be completed by another provider, this initial triage assessment does not replace that evaluation, and the importance of remaining in the ED until their evaluation is complete.  Orders: CBC, CMP, lipase, i-STAT Chem-8, UA, respiratory panel, chest x-ray, CT abdomen pelvis with contrast   Patient just received Tylenol  via EMS   Heather Petty, [place] 02/22/24 2223

## 2024-02-22 NOTE — ED Provider Notes (Signed)
 " [place] [facility] AT [facility] Provider Note   CSN: [id number] Arrival date & time: 02/22/24  1943     Patient presents with: Emesis and Abdominal Pain   Heather Petty is a 74 y.o. female.    Emesis Associated symptoms: abdominal pain   Abdominal Pain Associated symptoms: vomiting   Patient presents with abdominal pain since early this morning.  Vomiting.  Reportedly fever.  Has had bowel movements.  No dysuria.    Past Medical History:  Diagnosis Date   AKI (acute kidney injury) 12/17/2017   Allergic rhinitis    Anemia    Arthritis    Asthma    Autonomic neuropathy    Depression    Diabetes mellitus    Diabetes mellitus without complication (HCC)    E coli infection    GERD (gastroesophageal reflux disease)    GERD (gastroesophageal reflux disease)    Gout    Hepatitis A    High cholesterol    Hypertension    Insomnia    Kidney failure    Metabolic encephalopathy    Morbid obesity (HCC)    NASH (nonalcoholic steatohepatitis)    Pneumonia 12/17/2017   Schizophrenia (HCC)    Stroke (HCC)    Vitamin B deficiency    Vitamin D  deficiency   Hodgkin's lymphoma  Past Surgical History:  Procedure Laterality Date   CYST EXCISION     buttucks   DENTAL SURGERY     IR IMAGING GUIDED PORT INSERTION  01/10/2022   IR IMAGING GUIDED PORT INSERTION  04/21/2022   IR REMOVAL TUN ACCESS W/ PORT W/O FL MOD SED  02/28/2022   IR REMOVAL TUN ACCESS W/ PORT W/O FL MOD SED  02/02/2023   TONSILLECTOMY      there  Prior to Admission medications  Medication Sig Start Date End Date Taking? Authorizing Provider  acetaminophen  (TYLENOL ) 500 MG tablet Take 1,000 mg by mouth every 6 (six) hours as needed (for pain- CANNOT EXCEED A TOTAL OF 3,000 MG/DAY FROM ALL COMBINED SOURCES).    [provider]  albuterol  (VENTOLIN  HFA) 108 (90 Base) MCG/ACT inhaler Inhale 2 puffs into the lungs every 4 (four) hours as needed for wheezing or shortness of  breath.    [provider]  allopurinol  (ZYLOPRIM ) 100 MG tablet Take 100 mg by mouth daily. 01/19/22   [provider]  Amino Acids -Protein Hydrolys (PRO-STAT) LIQD Take 30 mLs by mouth 2 (two) times daily between meals.    [provider]  amLODipine  (NORVASC ) 10 MG tablet Take 10 mg by mouth daily.    [provider]  ASPERCREME LIDOCAINE  4 % Place 1 patch onto the skin See admin instructions. Apply 1 patch to both knees once a day- remove as directed    [provider]  calamine lotion Apply 1 Application topically 2 (two) times daily. 03/02/22   Will Almarie MATSU, MD  carvedilol  (COREG ) 6.25 MG tablet Take 6.25 mg by mouth 2 (two) times daily with a meal.    [provider]  cholecalciferol  (VITAMIN D3) 25 MCG (1000 UNIT) tablet Take 2,000 Units by mouth daily.    [provider]  doxepin  (SINEQUAN ) 25 MG capsule Take 25 mg by mouth at bedtime.    [provider]  doxycycline  (VIBRA -TABS) 100 MG tablet Take 1 tablet (100 mg total) by mouth 2 (two) times daily. Patient not taking: Reported on 11/17/2022 06/20/22   Thayil, Irene T, PA-C  Emollient (EUCERIN BABY  EX) Apply 1 application  topically See admin instructions. Apply to affected areas daily where the skin is peeling    [provider]  famotidine  (PEPCID ) 20 MG tablet Take 20 mg by mouth every 12 (twelve) hours.    [provider]  fluticasone  (FLONASE ) 50 MCG/ACT nasal spray Place 1 spray into both nostrils daily.    [provider]  glipiZIDE (GLUCOTROL XL) 10 MG 24 hr tablet Take 10 mg by mouth in the morning and at bedtime. 03/13/22   [provider]  guaiFENesin  (MUCINEX ) 600 MG 12 hr tablet Take by mouth 2 (two) times daily as needed.    [provider]  hydrOXYzine  (ATARAX /VISTARIL ) 25 MG tablet Take 25 mg by mouth in the morning and at bedtime.    [provider]  insulin  aspart (NOVOLOG ) 100 UNIT/ML  injection Inject 0-12 Units into the skin See admin instructions. Inject 0-12 units into the skin 4 times a day (before meals and at bedtime) per sliding scale: BGL <60 or >400  = CALL MD; 60-200 = 0 units; 201-250 = 2 units; 251-300 = 4 units; 301-350 = 6 units; 351-400 = 8 units; 401-450 = 10 units; >450 = 12 units 07/31/19   Krishnan, Sendil K, MD  ipratropium-albuterol  (DUONEB) 0.5-2.5 (3) MG/3ML SOLN Take 3 mLs by nebulization 3 (three) times daily. Patient not taking: Reported on 11/17/2022    [provider]  LANTUS  SOLOSTAR 100 UNIT/ML Solostar Pen Inject 28-42 Units into the skin See admin instructions. Inject 42 units into the skin in the morning and 28 units at bedtime 02/02/22   [provider]  lidocaine -prilocaine  (EMLA ) cream Apply 1 Application topically as needed. Patient taking differently: Apply 1 Application topically See admin instructions. Apply to port site 1-2 hours prior to chemo- cover with saran wrap and secure with tape 01/16/22   Neomi Lis T, PA-C  lisinopril  (ZESTRIL ) 5 MG tablet Take 5 mg by mouth daily.    [provider]  loperamide  (IMODIUM ) 2 MG capsule Take 1 capsule (2 mg total) by mouth as needed for diarrhea or loose stools. 09/09/19   Pokhrel, Laxman, MD  loratadine  (CLARITIN ) 10 MG tablet Take 10 mg by mouth daily.    [provider]  Nutritional Supplements (BOOST GLUCOSE CONTROL) LIQD Take 237 mLs by mouth 2 (two) times daily between meals.    [provider]  nystatin  (MYCOSTATIN /NYSTOP ) powder Apply topically 2 (two) times daily. Patient not taking: Reported on 08/02/2022 05/24/22   Rojelio Nest, DO  polyethylene glycol powder (GLYCOLAX /MIRALAX ) 17 GM/SCOOP powder Take 17 g by mouth daily as needed for mild constipation (to be mixed into 4-8 ounces of a beverage).    [provider]  prochlorperazine  (COMPAZINE ) 10 MG tablet Take 1 tablet (10 mg total) by mouth every 6 (six) hours as needed for nausea or  vomiting. Patient not taking: Reported on 11/17/2022 01/16/22   Neomi Lis T, PA-C  SEMGLEE , YFGN, 100 UNIT/ML injection Inject 24 Units into the skin at bedtime. 11/14/22   [provider]  Simethicone  (GAS-X ULTRA STRENGTH) 180 MG CAPS Take 1 capsule by mouth every 8 (eight) hours as needed (gas).    [provider]  SYSTANE BALANCE 0.6 % SOLN Place 1 drop into both eyes every 12 (twelve) hours.    [provider]    Allergies: Vancomycin , Emend [aprepitant], Fosaprepitant , Lasix  [furosemide ], Lasix  [furosemide ], and Pineapple    Review of Systems  Gastrointestinal:  Positive for abdominal pain  and vomiting.    Updated Vital Signs BP (!) 186/63   Pulse 85   Temp 99.8 F (37.7 C) (Oral)   Resp 20   Ht 5' 2 (1.575 m)   Wt 102.1 kg   SpO2 98%   BMI 41.15 kg/m   Physical Exam Vitals and nursing note reviewed.  Cardiovascular:     Rate and Rhythm: Normal rate and regular rhythm.  Pulmonary:     Effort: No respiratory distress.  Abdominal:     Tenderness: There is abdominal tenderness.     Comments: Right side abdominal tenderness.  Worse in upper quadrant but also in lower quadrant.  No hernia palpated.  Neurological:     Mental Status: She is alert.     (all labs ordered are listed, but only abnormal results are displayed) Labs Reviewed  RESP PANEL BY RT-PCR (RSV, FLU A&B, COVID)  [id number]  LIPASE, BLOOD  COMPREHENSIVE METABOLIC PANEL WITH GFR  URINALYSIS, ROUTINE W REFLEX MICROSCOPIC  CBC  I-STAT CHEM 8, ED    EKG: EKG Interpretation Date/Time:  Friday February 22 2024 20:03:16 EST Ventricular Rate:  91 PR Interval:  162 QRS Duration:  74 QT Interval:  382 QTC Calculation: 469 R Axis:   -28  Text Interpretation: Normal sinus rhythm Cannot rule out Anterior infarct , age undetermined Abnormal ECG When compared with ECG of 03-Mar-2022 10:52, PREVIOUS ECG IS PRESENT No significant change since last tracing Confirmed by Patsey Lot [phone]) on 02/22/2024 9:22:52 PM  Radiology: No results found.   Procedures   Medications Ordered in the ED  HYDROmorphone  (DILAUDID ) injection 0.5 mg (has no administration in time range)                                    Medical Decision Making Amount and/or Complexity of Data Reviewed Labs: ordered.  Risk Prescription drug management.   Patient abdominal pain.  Some nausea and vomiting.  Differential diagnosis includes bowel obstruction, infection but also causes such as cholecystitis.  Previous CT scan did not show cholelithiasis.  Will get blood work give pain control and get CT scan.   White count is normal.  However CMP does show elevated bilirubin alk phos AST and ALT.  Lipase also greater than 2800.  CT scan still pending.  Potentially biliary pancreatitis.  Likely require admission to hospital.  Care will be turned over to oncoming provider, Dr. Jerral.      Final diagnoses:  None    ED Discharge Orders     None          Patsey Lot, MD 02/22/24 2232  "

## 2024-02-22 NOTE — ED Notes (Signed)
 Patient transported to CT at this time.

## 2024-02-22 NOTE — ED Triage Notes (Signed)
 Patient bib [facility] for abdominal pain since this morning. She has been vomiting and had a fever. Had 2 regular BM's today. Denies diarrhea. EMS reports distended and hard abdomen,   Given 650mg  of tylenol  enroute.   EMS VS  100.2 182/80 84 P 97%RA CBG 254

## 2024-02-23 ENCOUNTER — Encounter (HOSPITAL_COMMUNITY): Payer: Self-pay | Admitting: Internal Medicine

## 2024-02-23 ENCOUNTER — Emergency Department (HOSPITAL_COMMUNITY)

## 2024-02-23 DIAGNOSIS — K8020 Calculus of gallbladder without cholecystitis without obstruction: Secondary | ICD-10-CM | POA: Diagnosis not present

## 2024-02-23 DIAGNOSIS — E785 Hyperlipidemia, unspecified: Secondary | ICD-10-CM

## 2024-02-23 DIAGNOSIS — Z8673 Personal history of transient ischemic attack (TIA), and cerebral infarction without residual deficits: Secondary | ICD-10-CM | POA: Diagnosis not present

## 2024-02-23 DIAGNOSIS — L98429 Non-pressure chronic ulcer of back with unspecified severity: Secondary | ICD-10-CM | POA: Insufficient documentation

## 2024-02-23 DIAGNOSIS — Z794 Long term (current) use of insulin: Secondary | ICD-10-CM | POA: Diagnosis not present

## 2024-02-23 DIAGNOSIS — I10 Essential (primary) hypertension: Secondary | ICD-10-CM | POA: Diagnosis not present

## 2024-02-23 DIAGNOSIS — E119 Type 2 diabetes mellitus without complications: Secondary | ICD-10-CM

## 2024-02-23 DIAGNOSIS — K851 Biliary acute pancreatitis without necrosis or infection: Principal | ICD-10-CM

## 2024-02-23 DIAGNOSIS — L98421 Non-pressure chronic ulcer of back limited to breakdown of skin: Secondary | ICD-10-CM | POA: Diagnosis not present

## 2024-02-23 DIAGNOSIS — R1013 Epigastric pain: Secondary | ICD-10-CM | POA: Diagnosis not present

## 2024-02-23 DIAGNOSIS — K760 Fatty (change of) liver, not elsewhere classified: Secondary | ICD-10-CM

## 2024-02-23 DIAGNOSIS — R7401 Elevation of levels of liver transaminase levels: Secondary | ICD-10-CM | POA: Diagnosis not present

## 2024-02-23 DIAGNOSIS — E1169 Type 2 diabetes mellitus with other specified complication: Secondary | ICD-10-CM

## 2024-02-23 DIAGNOSIS — F203 Undifferentiated schizophrenia: Secondary | ICD-10-CM

## 2024-02-23 DIAGNOSIS — Z8571 Personal history of Hodgkin lymphoma: Secondary | ICD-10-CM

## 2024-02-23 DIAGNOSIS — Z8709 Personal history of other diseases of the respiratory system: Secondary | ICD-10-CM

## 2024-02-23 LAB — COMPREHENSIVE METABOLIC PANEL WITH GFR
ALT: 235 U/L — ABNORMAL HIGH (ref 0–44)
AST: 238 U/L — ABNORMAL HIGH (ref 15–41)
Albumin: 3.8 g/dL (ref 3.5–5.0)
Alkaline Phosphatase: 269 U/L — ABNORMAL HIGH (ref 38–126)
Anion gap: 11 (ref 5–15)
BUN: 12 mg/dL (ref 8–23)
CO2: 27 mmol/L (ref 22–32)
Calcium: 9.3 mg/dL (ref 8.9–10.3)
Chloride: 102 mmol/L (ref 98–111)
Creatinine, Ser: 0.7 mg/dL (ref 0.44–1.00)
GFR, Estimated: >60 mL/min
Glucose, Bld: 127 mg/dL — ABNORMAL HIGH (ref 70–99)
Potassium: 3.3 mmol/L — ABNORMAL LOW (ref 3.5–5.1)
Sodium: 140 mmol/L (ref 135–145)
Total Bilirubin: 0.9 mg/dL (ref 0.0–1.2)
Total Protein: 6.5 g/dL (ref 6.5–8.1)

## 2024-02-23 LAB — CBG MONITORING, ED
Glucose-Capillary: 100 mg/dL — ABNORMAL HIGH (ref 70–99)
Glucose-Capillary: 114 mg/dL — ABNORMAL HIGH (ref 70–99)
Glucose-Capillary: 181 mg/dL — ABNORMAL HIGH (ref 70–99)
Glucose-Capillary: 84 mg/dL (ref 70–99)

## 2024-02-23 LAB — URINALYSIS, ROUTINE W REFLEX MICROSCOPIC
Bacteria, UA: NONE SEEN
Bilirubin Urine: NEGATIVE
Glucose, UA: NEGATIVE mg/dL
Hgb urine dipstick: NEGATIVE
Ketones, ur: NEGATIVE mg/dL
Leukocytes,Ua: NEGATIVE
Nitrite: NEGATIVE
Protein, ur: 30 mg/dL — AB
Specific Gravity, Urine: 1.031 — ABNORMAL HIGH (ref 1.005–1.030)
pH: 6 (ref 5.0–8.0)

## 2024-02-23 LAB — CBC
HCT: 36.2 % (ref 36.0–46.0)
Hemoglobin: 12 g/dL (ref 12.0–15.0)
MCH: 32 pg (ref 26.0–34.0)
MCHC: 33.1 g/dL (ref 30.0–36.0)
MCV: 96.5 fL (ref 80.0–100.0)
Platelets: 141 K/uL — ABNORMAL LOW (ref 150–400)
RBC: 3.75 MIL/uL — ABNORMAL LOW (ref 3.87–5.11)
RDW: 15.9 % — ABNORMAL HIGH (ref 11.5–15.5)
WBC: 8.7 K/uL (ref 4.0–10.5)
nRBC: 0 % (ref 0.0–0.2)

## 2024-02-23 LAB — TRIGLYCERIDES: Triglycerides: 153 mg/dL — ABNORMAL HIGH

## 2024-02-23 LAB — I-STAT CG4 LACTIC ACID, ED
Lactic Acid, Venous: 2.3 mmol/L (ref 0.5–1.9)
Lactic Acid, Venous: 1.5 mmol/L (ref 0.5–1.9)

## 2024-02-23 MED ORDER — FENTANYL CITRATE (PF) 50 MCG/ML IJ SOSY: 50.0000 ug | PREFILLED_SYRINGE | INTRAMUSCULAR | Status: DC | PRN

## 2024-02-23 MED ORDER — INSULIN GLARGINE-YFGN 100 UNIT/ML ~~LOC~~ SOLN
10.0000 [IU] | Freq: Every day | SUBCUTANEOUS | Status: DC
  Filled 2024-02-23: qty 0.1

## 2024-02-23 MED ORDER — SODIUM CHLORIDE 0.9 % IV SOLN: 250.0000 mL | INTRAVENOUS | Status: DC | PRN

## 2024-02-23 MED ORDER — DOXEPIN HCL 25 MG PO CAPS
25.0000 mg | ORAL_CAPSULE | Freq: Every day | ORAL | Status: DC
  Administered 2024-02-24 – 2024-02-26 (×3): 25 mg via ORAL
  Filled 2024-02-23 (×6): qty 1

## 2024-02-23 MED ORDER — ALBUTEROL SULFATE (2.5 MG/3ML) 0.083% IN NEBU: 2.5000 mg | INHALATION_SOLUTION | RESPIRATORY_TRACT | Status: DC | PRN

## 2024-02-23 MED ORDER — PANTOPRAZOLE SODIUM 40 MG IV SOLR
40.0000 mg | INTRAVENOUS | Status: DC
  Administered 2024-02-23 – 2024-02-27 (×5): 40 mg via INTRAVENOUS
  Filled 2024-02-23 (×5): qty 10

## 2024-02-23 MED ORDER — ONDANSETRON HCL 4 MG/2ML IJ SOLN
4.0000 mg | Freq: Four times a day (QID) | INTRAMUSCULAR | Status: DC | PRN
  Administered 2024-02-24 – 2024-02-26 (×2): 4 mg via INTRAVENOUS
  Filled 2024-02-23 (×2): qty 2

## 2024-02-23 MED ORDER — IBUPROFEN 400 MG PO TABS: 400.0000 mg | ORAL_TABLET | Freq: Four times a day (QID) | ORAL | Status: DC | PRN

## 2024-02-23 MED ORDER — ENOXAPARIN SODIUM 60 MG/0.6ML IJ SOSY
50.0000 mg | PREFILLED_SYRINGE | INTRAMUSCULAR | Status: DC
  Administered 2024-02-23 – 2024-02-27 (×5): 50 mg via SUBCUTANEOUS
  Filled 2024-02-23 (×5): qty 0.6

## 2024-02-23 MED ORDER — PIPERACILLIN-TAZOBACTAM 3.375 G IVPB 30 MIN
3.3750 g | Freq: Once | INTRAVENOUS | Status: AC
  Administered 2024-02-23: 3.375 g via INTRAVENOUS
  Filled 2024-02-23: qty 50

## 2024-02-23 MED ORDER — PIPERACILLIN-TAZOBACTAM 3.375 G IVPB
3.3750 g | Freq: Three times a day (TID) | INTRAVENOUS | Status: DC
  Administered 2024-02-23 – 2024-02-26 (×12): 3.375 g via INTRAVENOUS
  Filled 2024-02-23 (×13): qty 50

## 2024-02-23 MED ORDER — GADOBUTROL 1 MMOL/ML IV SOLN
10.0000 mL | Freq: Once | INTRAVENOUS | Status: AC | PRN
  Administered 2024-02-23: 10 mL via INTRAVENOUS

## 2024-02-23 MED ORDER — INSULIN GLARGINE-YFGN 100 UNIT/ML ~~LOC~~ SOLN
15.0000 [IU] | Freq: Every day | SUBCUTANEOUS | Status: DC
  Filled 2024-02-23: qty 0.15

## 2024-02-23 MED ORDER — SODIUM CHLORIDE 0.9% FLUSH: 3.0000 mL | INTRAVENOUS | Status: DC | PRN

## 2024-02-23 MED ORDER — HYDROXYZINE HCL 25 MG PO TABS
25.0000 mg | ORAL_TABLET | Freq: Two times a day (BID) | ORAL | Status: DC
  Administered 2024-02-23 – 2024-02-27 (×9): 25 mg via ORAL
  Filled 2024-02-23 (×9): qty 1

## 2024-02-23 MED ORDER — ONDANSETRON HCL 4 MG PO TABS: 4.0000 mg | ORAL_TABLET | Freq: Four times a day (QID) | ORAL | Status: DC | PRN

## 2024-02-23 MED ORDER — SODIUM CHLORIDE 0.9% FLUSH
3.0000 mL | Freq: Two times a day (BID) | INTRAVENOUS | Status: DC
  Administered 2024-02-23 – 2024-02-27 (×4): 3 mL via INTRAVENOUS

## 2024-02-23 MED ORDER — LACTATED RINGERS IV SOLN: INTRAVENOUS | Status: AC

## 2024-02-23 MED ORDER — SODIUM CHLORIDE 0.9% FLUSH
3.0000 mL | Freq: Two times a day (BID) | INTRAVENOUS | Status: DC
  Administered 2024-02-23: 3 mL via INTRAVENOUS

## 2024-02-23 MED ORDER — CARVEDILOL 6.25 MG PO TABS
6.2500 mg | ORAL_TABLET | Freq: Two times a day (BID) | ORAL | Status: DC
  Administered 2024-02-23 – 2024-02-27 (×9): 6.25 mg via ORAL
  Filled 2024-02-23 (×2): qty 1
  Filled 2024-02-23: qty 2
  Filled 2024-02-23: qty 1
  Filled 2024-02-23 (×2): qty 2
  Filled 2024-02-23 (×5): qty 1

## 2024-02-23 MED ORDER — INSULIN GLARGINE-YFGN 100 UNIT/ML ~~LOC~~ SOLN: 15.0000 [IU] | Freq: Every day | SUBCUTANEOUS | Status: DC

## 2024-02-23 MED ORDER — POTASSIUM CHLORIDE 10 MEQ/100ML IV SOLN
10.0000 meq | INTRAVENOUS | Status: AC
  Administered 2024-02-23 (×2): 10 meq via INTRAVENOUS
  Filled 2024-02-23 (×2): qty 100

## 2024-02-23 MED ORDER — CARVEDILOL 3.125 MG PO TABS: 6.2500 mg | ORAL_TABLET | Freq: Two times a day (BID) | ORAL | Status: DC

## 2024-02-23 MED ORDER — MORPHINE SULFATE (PF) 2 MG/ML IV SOLN
1.0000 mg | INTRAVENOUS | Status: DC | PRN
  Administered 2024-02-23 (×2): 1 mg via INTRAVENOUS
  Administered 2024-02-24 (×2): 2 mg via INTRAVENOUS
  Filled 2024-02-23 (×5): qty 1

## 2024-02-23 NOTE — ED Notes (Signed)
 Message sent to Sundil, MD regarding patient's insulin  glargine administration. CBG currently 100 and patient has another dose due at 1000. Per Lee, MD hold insulin  at this time.

## 2024-02-23 NOTE — H&P (Signed)
 " History and Physical    Heather Petty FMW:[id number] DOB: 12-14-1950 DOA: 02/22/2024  PCP: System, Provider Not In   Patient coming from: Home   Chief Complaint:  Chief Complaint  Patient presents with   Emesis   Abdominal Pain   ED TRIAGE note:Patient bib [facility] for abdominal pain since this morning. She has been vomiting and had a fever. Had 2 regular BM's today. Denies diarrhea. EMS reports distended and hard abdomen,   Given 650mg  of tylenol  enroute.   EMS VS  100.2 182/80 84 P 97%RA CBG 254  HPI:  Heather Petty is a 74 y.o. female with medical history significant of Hodgkin lymphoma stage III s/p completed chemotherapy, sacral ulcer, chronic transaminitis, hepatic steatosis/possible cirrhosis, asthma, insulin -dependent DM type II, morbid obesity, schizophrenia, hyperlipidemia, osteoarthritis, depression, GERD, essential hypertension, insomnia, and history of CVA presented to emergency department complaining of abdominal pain with associated nausea and vomiting.  Patient has 2 episode of regular bowel movement.  EMS found patient has low-grade temperature. Patient complaining about midepigastric abdominal pain which is intermittent in quality with associated nausea.  Denies any hematemesis.  Denies fever however patient complaining about chill sensation.  Denies chest pain, palpitation shortness of breath.  No other complaint at this time.   ED Course:  At presentation to ED patient found borderline hypertensive after her blood pressure improved.  Otherwise hemodynamically stable.  Afebrile.  Lab work, CBC unremarkable.   CMP showing elevated blood glucose 220, elevated AST/ALT/ALP 321/209/240 and elevated bilirubin 1.9.  Elevated lipase level 2800. UA unremarkable.  Elevated lactic acid level 2.3.  CT abdomen pelvis showed: 1. Motion degradation limits evaluation. 2. Question subtle peripancreatic haziness, correlate with laboratory values for possible  pancreatitis. 3. Distended gallbladder with small stones. This may be correlated with RUQ US  as indicated. 4. Diverticular disease of the colon without acute wall thickening. 5. Aortic atherosclerosis.  Right upper quadrant ultrasound showed cholelithiasis without evidence of acute cholecystitis.  Normal biliary ductal diameter and no intrahepatic ductal dilation.  Pending MRCP.  In the ED patient received fentanyl , Dilaudid , 1 L of LR bolus.  Given patient is developing low-grade fever concern for early development of cholangitis.  In the ED IV Zosyn  has been initiated.  EDP Dr. Jerral also consulted [facility] Dr. Albertus for further recommendation will see patient in the daytime.  Hospitalist consulted for further evaluation management of acute pancreatitis, significant transaminitis, cholelithiasis and concern for early development of cholangitis.   Significant labs in the ED: Lab Orders         Resp panel by RT-PCR (RSV, Flu A&B, Covid) Anterior Nasal Swab         Blood culture (routine x 2)         Lipase, blood         Comprehensive metabolic panel         Urinalysis, Routine w reflex microscopic -Urine, Clean Catch         CBC         Triglycerides         CBC         Comprehensive metabolic panel         I-stat chem 8, ED (not at [facility], [facility] or [facility])         I-Stat CG4 Lactic Acid       Review of Systems:  Review of Systems  Constitutional:  Positive for chills. Negative for fever, malaise/fatigue and weight loss.  Respiratory:  Negative for  cough and sputum production.   Cardiovascular:  Negative for chest pain and palpitations.  Gastrointestinal:  Positive for abdominal pain, nausea and vomiting. Negative for blood in stool, constipation, diarrhea and melena.  Musculoskeletal:  Negative for myalgias.  Neurological:  Negative for dizziness and headaches.  Psychiatric/Behavioral:  The patient is not nervous/anxious.     Past Medical History:  Diagnosis Date    AKI (acute kidney injury) 12/17/2017   Allergic rhinitis    Anemia    Arthritis    Asthma    Autonomic neuropathy    Depression    Diabetes mellitus    Diabetes mellitus without complication (HCC)    E coli infection    GERD (gastroesophageal reflux disease)    GERD (gastroesophageal reflux disease)    Gout    Hepatitis A    High cholesterol    Hypertension    Insomnia    Kidney failure    Metabolic encephalopathy    Morbid obesity (HCC)    NASH (nonalcoholic steatohepatitis)    Pneumonia 12/17/2017   Schizophrenia (HCC)    Stroke (HCC)    Vitamin B deficiency    Vitamin D  deficiency     Past Surgical History:  Procedure Laterality Date   CYST EXCISION     buttucks   DENTAL SURGERY     IR IMAGING GUIDED PORT INSERTION  01/10/2022   IR IMAGING GUIDED PORT INSERTION  04/21/2022   IR REMOVAL TUN ACCESS W/ PORT W/O FL MOD SED  02/28/2022   IR REMOVAL TUN ACCESS W/ PORT W/O FL MOD SED  02/02/2023   TONSILLECTOMY       reports that she has never smoked. She has never used smokeless tobacco. She reports that she does not drink alcohol  and does not use drugs.  Allergies[1]  Family History  Adopted: Yes  Problem Relation Age of Onset   COPD Daughter     Prior to Admission medications  Medication Sig Start Date End Date Taking? Authorizing Provider  acetaminophen  (TYLENOL ) 500 MG tablet Take 1,000 mg by mouth every 6 (six) hours as needed (for pain- CANNOT EXCEED A TOTAL OF 3,000 MG/DAY FROM ALL COMBINED SOURCES).    [provider]  albuterol  (VENTOLIN  HFA) 108 (90 Base) MCG/ACT inhaler Inhale 2 puffs into the lungs every 4 (four) hours as needed for wheezing or shortness of breath.    [provider]  allopurinol  (ZYLOPRIM ) 100 MG tablet Take 100 mg by mouth daily. 01/19/22   [provider]  Amino Acids -Protein Hydrolys (PRO-STAT) LIQD Take 30 mLs by mouth 2 (two) times daily between meals.    [provider]  amLODipine  (NORVASC ) 10  MG tablet Take 10 mg by mouth daily.    [provider]  ASPERCREME LIDOCAINE  4 % Place 1 patch onto the skin See admin instructions. Apply 1 patch to both knees once a day- remove as directed    [provider]  calamine lotion Apply 1 Application topically 2 (two) times daily. 03/02/22   Will Almarie MATSU, MD  carvedilol  (COREG ) 6.25 MG tablet Take 6.25 mg by mouth 2 (two) times daily with a meal.    [provider]  cholecalciferol  (VITAMIN D3) 25 MCG (1000 UNIT) tablet Take 2,000 Units by mouth daily.    [provider]  doxepin  (SINEQUAN ) 25 MG capsule Take 25 mg by mouth at bedtime.    [provider]  doxycycline  (VIBRA -TABS) 100 MG tablet Take 1 tablet (100 mg total) by mouth  2 (two) times daily. Patient not taking: Reported on 11/17/2022 06/20/22   Thayil, Irene T, PA-C  Emollient (EUCERIN BABY EX) Apply 1 application  topically See admin instructions. Apply to affected areas daily where the skin is peeling    [provider]  famotidine  (PEPCID ) 20 MG tablet Take 20 mg by mouth every 12 (twelve) hours.    [provider]  fluticasone  (FLONASE ) 50 MCG/ACT nasal spray Place 1 spray into both nostrils daily.    [provider]  glipiZIDE (GLUCOTROL XL) 10 MG 24 hr tablet Take 10 mg by mouth in the morning and at bedtime. 03/13/22   [provider]  guaiFENesin  (MUCINEX ) 600 MG 12 hr tablet Take by mouth 2 (two) times daily as needed.    [provider]  hydrOXYzine  (ATARAX /VISTARIL ) 25 MG tablet Take 25 mg by mouth in the morning and at bedtime.    [provider]  insulin  aspart (NOVOLOG ) 100 UNIT/ML injection Inject 0-12 Units into the skin See admin instructions. Inject 0-12 units into the skin 4 times a day (before meals and at bedtime) per sliding scale: BGL <60 or >400  = CALL MD; 60-200 = 0 units; 201-250 = 2 units; 251-300 = 4 units; 301-350 = 6 units; 351-400 = 8 units; 401-450 = 10 units;  >450 = 12 units 07/31/19   Krishnan, Sendil K, MD  ipratropium-albuterol  (DUONEB) 0.5-2.5 (3) MG/3ML SOLN Take 3 mLs by nebulization 3 (three) times daily. Patient not taking: Reported on 11/17/2022    [provider]  LANTUS  SOLOSTAR 100 UNIT/ML Solostar Pen Inject 28-42 Units into the skin See admin instructions. Inject 42 units into the skin in the morning and 28 units at bedtime 02/02/22   [provider]  lidocaine -prilocaine  (EMLA ) cream Apply 1 Application topically as needed. Patient taking differently: Apply 1 Application topically See admin instructions. Apply to port site 1-2 hours prior to chemo- cover with saran wrap and secure with tape 01/16/22   Neomi Lis T, PA-C  lisinopril  (ZESTRIL ) 5 MG tablet Take 5 mg by mouth daily.    [provider]  loperamide  (IMODIUM ) 2 MG capsule Take 1 capsule (2 mg total) by mouth as needed for diarrhea or loose stools. 09/09/19   Pokhrel, Laxman, MD  loratadine  (CLARITIN ) 10 MG tablet Take 10 mg by mouth daily.    [provider]  Nutritional Supplements (BOOST GLUCOSE CONTROL) LIQD Take 237 mLs by mouth 2 (two) times daily between meals.    [provider]  nystatin  (MYCOSTATIN /NYSTOP ) powder Apply topically 2 (two) times daily. Patient not taking: Reported on 08/02/2022 05/24/22   Rojelio Nest, DO  polyethylene glycol powder (GLYCOLAX /MIRALAX ) 17 GM/SCOOP powder Take 17 g by mouth daily as needed for mild constipation (to be mixed into 4-8 ounces of a beverage).    [provider]  prochlorperazine  (COMPAZINE ) 10 MG tablet Take 1 tablet (10 mg total) by mouth every 6 (six) hours as needed for nausea or vomiting. Patient not taking: Reported on 11/17/2022 01/16/22   Neomi Lis T, PA-C  SEMGLEE , YFGN, 100 UNIT/ML injection Inject 24 Units into the skin at bedtime. 11/14/22   [provider]  Simethicone  (GAS-X ULTRA STRENGTH) 180 MG CAPS Take 1 capsule by mouth every 8 (eight) hours as  needed (gas).    [provider]  SYSTANE BALANCE 0.6 % SOLN Place 1 drop into both eyes every 12 (twelve) hours.    [provider]     Physical Exam:  Vitals:   02/22/24 2215 02/22/24 2216 02/23/24 0145 02/23/24 0200  BP: (!) 147/59 (!) 147/59 (!) 152/71 (!) 142/67  Pulse: 90 87 80 77  Resp: 20 18 (!) 23 (!) 26  Temp:  99.9 F (37.7 C)    TempSrc:  Oral    SpO2: 98% 96% 100% 100%  Weight:      Height:        Physical Exam Vitals and nursing note reviewed.  Constitutional:      Appearance: She is obese. She is not ill-appearing.  Cardiovascular:     Rate and Rhythm: Normal rate and regular rhythm.     Heart sounds: Normal heart sounds.  Abdominal:     General: Bowel sounds are normal. There is no distension.     Palpations: Abdomen is soft. There is no hepatomegaly.     Tenderness: There is no abdominal tenderness. There is no guarding or rebound. Negative signs include Murphy's sign.  Skin:    Capillary Refill: Capillary refill takes less than 2 seconds.  Neurological:     Mental Status: She is alert and oriented to person, place, and time.      Labs on Admission: I have personally reviewed following labs and imaging studies  CBC: Recent Labs  Lab 02/22/24 2123 02/22/24 2126  WBC  --  9.5  HGB 15.0 13.6  HCT 44.0 40.4  MCV  --  96.0  PLT  --  153   Basic Metabolic Panel: Recent Labs  Lab 02/22/24 2022 02/22/24 2123  NA 138 140  K 4.2 4.0  CL 102 100  CO2 22  --   GLUCOSE 220* 225*  BUN 13 15  CREATININE 0.71 0.60  CALCIUM  9.7  --    GFR: Estimated Creatinine Clearance: 68 mL/min (by C-G formula based on SCr of 0.6 mg/dL). Liver Function Tests: Recent Labs  Lab 02/22/24 2022  AST 321*  ALT 209*  ALKPHOS 240*  BILITOT 1.9*  PROT 7.3  ALBUMIN 3.9   Recent Labs  Lab 02/22/24 2022  LIPASE >2,800*   No results for input(s): AMMONIA in the last 168 hours. Coagulation Profile: No results for input(s): INR, PROTIME in  the last 168 hours. Cardiac Enzymes: No results for input(s): CKTOTAL, CKMB, CKMBINDEX, TROPONINI, TROPONINIHS in the last 168 hours. BNP (last 3 results) No results for input(s): BNP in the last 8760 hours. HbA1C: No results for input(s): HGBA1C in the last 72 hours. CBG: No results for input(s): GLUCAP in the last 168 hours. Lipid Profile: No results for input(s): CHOL, HDL, LDLCALC, TRIG, CHOLHDL, LDLDIRECT in the last 72 hours. Thyroid  Function Tests: No results for input(s): TSH, T4TOTAL, FREET4, T3FREE, THYROIDAB in the last 72 hours. Anemia Panel: No results for input(s): VITAMINB12, FOLATE, FERRITIN, TIBC, IRON, RETICCTPCT in the last 72 hours. Urine analysis:    Component Value Date/Time   COLORURINE YELLOW 02/22/2024 2353   APPEARANCEUR CLEAR 02/22/2024 2353   LABSPEC 1.031 (H) 02/22/2024 2353   PHURINE 6.0 02/22/2024 2353   GLUCOSEU NEGATIVE 02/22/2024 2353   HGBUR NEGATIVE 02/22/2024 2353   BILIRUBINUR NEGATIVE 02/22/2024 2353   KETONESUR NEGATIVE 02/22/2024 2353   PROTEINUR 30 (A) 02/22/2024 2353   UROBILINOGEN 0.2 12/27/2010 1858   NITRITE NEGATIVE 02/22/2024 2353   LEUKOCYTESUR NEGATIVE 02/22/2024 2353    Radiological Exams on Admission: I have personally reviewed images US  Abdomen Limited RUQ (LIVER/GB) Result Date: 02/23/2024 EXAM: Right Upper Quadrant Abdominal Ultrasound 02/23/2024 01:35:32 AM TECHNIQUE: Real-time ultrasonography of the right upper  quadrant of the abdomen was performed. COMPARISON: CT from the previous day. CLINICAL HISTORY: Right upper quadrant pain. FINDINGS: LIVER: Diffuse increased echogenicity is noted within the liver consistent with fatty infiltration. No intrahepatic biliary ductal dilatation is seen. No evidence of mass. Hepatopetal flow in the portal vein. BILIARY SYSTEM: The gallbladder is well distended. No wall thickening or pericholecystic fluid is noted. Cholelithiasis is seen  without complicating factors. The common bile duct measures 8.8 mm which is mildly prominent for the patient's given age. Normal tapering distally is noted. IMPRESSION: 1. Cholelithiasis without sonographic evidence of acute cholecystitis. 2. Mildly prominent common bile duct measuring 8.8 mm with normal distal tapering and no intrahepatic ductal dilatation. Electronically signed by: Oneil Devonshire MD 02/23/2024 01:44 AM EST RP Workstation: [id number]   CT ABDOMEN PELVIS W CONTRAST Result Date: 02/23/2024 CLINICAL DATA:  Generalized abdominal pain EXAM: CT ABDOMEN AND PELVIS WITH CONTRAST TECHNIQUE: Multidetector CT imaging of the abdomen and pelvis was performed using the standard protocol following bolus administration of intravenous contrast. RADIATION DOSE REDUCTION: This exam was performed according to the departmental dose-optimization program which includes automated exposure control, adjustment of the mA and/or kV according to patient size and/or use of iterative reconstruction technique. CONTRAST:  80mL OMNIPAQUE  IOHEXOL  350 MG/ML SOLN COMPARISON:  CT 11/12/2023, 05/10/2023 FINDINGS: Lower chest: Lung bases demonstrate no acute airspace disease. Cardiomegaly and coronary vascular calcification. Hepatobiliary: Hepatic steatosis. Distended gallbladder with small stones. No biliary dilatation Pancreas: Fatty atrophy. Motion degradation, but question subtle peripancreatic haziness Spleen: Normal in size without focal abnormality. Adrenals/Urinary Tract: Adrenal glands are within normal limits. Kidneys show no hydronephrosis. The bladder is unremarkable Stomach/Bowel: Stomach within normal limits. No dilated small bowel. Diverticular disease of the colon without acute wall thickening Vascular/Lymphatic: Aortic atherosclerosis. No aneurysm. Subcentimeter retroperitoneal lymph nodes Reproductive: Uterus and bilateral adnexa are unremarkable. Other: No ascities, or free air. Musculoskeletal: No fracture is  seen.Multilevel degenerative change. IMPRESSION: 1. Motion degradation limits evaluation. 2. Question subtle peripancreatic haziness, correlate with laboratory values for possible pancreatitis. 3. Distended gallbladder with small stones. This may be correlated with RUQ US  as indicated. 4. Diverticular disease of the colon without acute wall thickening. 5. Aortic atherosclerosis. Aortic Atherosclerosis (ICD10-I70.0). Electronically Signed   By: Luke Bun M.D.   On: 02/23/2024 00:03   DG Chest Portable 1 View Result Date: 02/22/2024 EXAM: 1 VIEW(S) XRAY OF THE CHEST 02/22/2024 09:31:00 PM COMPARISON: Chest x-ray 05/18/2022. CLINICAL HISTORY: cough, shortness of breath FINDINGS: LUNGS AND PLEURA: Low lung volumes. Streaky opacities at left lung base. No pleural effusion. No pneumothorax. HEART AND MEDIASTINUM: Aortic atherosclerosis. No acute abnormality of the cardiac and mediastinal silhouettes. BONES AND SOFT TISSUES: No acute osseous abnormality. IMPRESSION: 1. Streaky left basilar opacities, favored atelectasis in the setting of low lung volumes. 2. Aortic atherosclerosis. Electronically signed by: Greig Pique MD 02/22/2024 09:46 PM EST RP Workstation: [id number]     EKG: My personal interpretation of EKG shows: Normal sinus rhythm heart rate 91.    Assessment/Plan: Principal Problem:   Acute gallstone pancreatitis Active Problems:   Cholelithiasis   Hodgkin lymphoma (HCC)   HTN (hypertension)   Hyperlipidemia associated with type 2 diabetes mellitus (HCC)   DM II (diabetes mellitus, type II), controlled (HCC)   Schizophrenia (HCC)   Chronic transaminitis/hepatic steatosis   Sacral ulcer (HCC)   History of asthma    Assessment and Plan: Acute gallstone pancreatitis Transaminitis History of abnormal liver function/hepatic steatosis Concern for early development of acute cholangitis -Patient presented  emergency department sudden onset of abdominal pain with associated nausea  vomiting 2 episodes.  Have regular bowel movement x 2.  At presentation to ED patient found hemodynamically stable.  Per patient report at home she is feeling chilled and EMS reported low-grade temperature 100.2 F. -Lab work, CBC unremarkable.   CMP showing elevated blood glucose 220, elevated AST/ALT/ALP 321/209/240 and elevated bilirubin 1.9.  Elevated lipase level 2800. UA unremarkable.  Elevated lactic acid level 2.3. - CT abdomen pelvis showed:  Question subtle peripancreatic haziness, correlate with laboratory values for possible pancreatitis. Distended gallbladder with small stones. Diverticular disease of the colon without acute wall thickening. - Right upper quadrant ultrasound showed cholelithiasis without evidence of acute cholecystitis.  Normal biliary ductal diameter and no intrahepatic ductal dilation. -Checking triglyceride level.  Pending MRCP. - In the ED patient received fentanyl , Dilaudid , 1 L of LR bolus. - Given patient is developing low-grade fever concern for early development of cholangitis.  In the ED IV Zosyn  has been initiated.   -EDP Dr. Jerral also consulted [facility] Dr. Albertus for further recommendation will see patient in the daytime. -In the setting of gallstone pancreatitis with significant transaminitis and low-grade fever continue IV Zosyn  with pharmacy consult.  Need to follow-up with blood cultures result. - Keeping patient n.p.o. and continue maintenance fluid LR 125 cc/h. -Continue pain control and nausea control. -Continue to monitor liver function and avoid hepatotoxic agents. - Need to follow-up with MRCP result.    History of Hodgkin lymphoma -History of Hodgkin lymphoma stage III status post chemotherapy and currently in remission follows oncology.  Elevated lactic acid 2.3 - Lactic acidosis in the setting of poor hepatic clearance from significant transaminitis.  At this time patient does not meets sepsis criteria.  Essential  hypertension -Continue Coreg  6.25 mg twice daily.  Insulin -dependent DM type II As patient is n.p.o. holding short acting insulin .  Decreasing long-acting insulin  24 units to 15 units in the setting of n.p.o. status.  History of schizophrenia -Continue doxepin  and Atarax   History of sacral ulcer -Continue turn patient every 2 hours and air mattress  History of asthma -Stable.  Continue albuterol  as needed   DVT prophylaxis:  Lovenox  Code Status:  Full Code Diet: N.p.o. Family Communication:   Family was present at bedside, at the time of interview. Opportunity was given to ask question and all questions were answered satisfactorily.  Disposition Plan: Need to follow-up with MRCP result and continue to monitor improvement of hepatic function. Consults: Gastroenterology Admission status:   Inpatient, Step Down Unit  Severity of Illness: The appropriate patient status for this patient is INPATIENT. Inpatient status is judged to be reasonable and necessary in order to provide the required intensity of service to ensure the patient's safety. The patient's presenting symptoms, physical exam findings, and initial radiographic and laboratory data in the context of their chronic comorbidities is felt to place them at high risk for further clinical deterioration. Furthermore, it is not anticipated that the patient will be medically stable for discharge from the hospital within 2 midnights of admission.   * I certify that at the point of admission it is my clinical judgment that the patient will require inpatient hospital care spanning beyond 2 midnights from the point of admission due to high intensity of service, high risk for further deterioration and high frequency of surveillance required.DEWAINE    Smith Potenza, MD [facility]  How to contact the [facility] Attending or Consulting provider 7A - 7P or  covering provider during after hours 7P -7A, for this patient.  Check the care team in [facility]  and look for a) attending/consulting [facility] provider listed and b) the [facility] team listed Log into [email or web address] and use [facility]'s universal password to access. If you do not have the password, please contact the hospital operator. Locate the [facility] provider you are looking for under [facility] and page to a number that you can be directly reached. If you still have difficulty reaching the provider, please page the [facility] (Director on Call) for the Hospitalists listed on amion for assistance.  02/23/2024, 4:06 AM           [1]  Allergies Allergen Reactions   Vancomycin  Itching and Other (See Comments)    Patient becomes hypotensive, lethargic and itchy. Has tolerated it many times.   Emend [Aprepitant] Other (See Comments)    Pt experienced shooting back pain at 10/10 when medication was administered the first time. See Hypersensitivity note from 01/17/2022.   Fosaprepitant  Other (See Comments)    Back pain / hypersensitivity 01/17/2022   Lasix  [Furosemide ] Other (See Comments)    IV-LASIX  ==> Reaction: Burning   Lasix  [Furosemide ] Other (See Comments)    On MAR as allergic   Pineapple Rash   "

## 2024-02-23 NOTE — ED Notes (Signed)
 Patient here for abdominal pain. Patient in admission status, patient reports having some n/v throughout the day and was unable to keep anything down. Patient came into ED for work up. Patient is A&Ox4. Speech is clear and coherent. No signs of distress at this time. Patient in gown, placed on cardiac monitoring. Call light within reach, bed locked and at the lowest position.

## 2024-02-23 NOTE — Consult Note (Addendum)
 "   Consultation  Referring Provider: TRH/Sundil Primary Care Physician:  System, Provider Not In Primary Gastroenterologist:  Dr. Sherrilyn  Reason for Consultation: Acute pancreatitis, concern for choledocholithiasis  HPI: Heather Petty is a 74 y.o. female with history of insulin -dependent diabetes mellitus, morbid obesity, Hodgkin's lymphoma stage II status post chemotherapy, history of sacral ulceration, hepatic steatosis with chronic LFT elevation, hyperlipidemia, osteoarthritis, hypertension, prior CVA, history of schizophrenia. Patient was brought to the emergency room last night by EMS with complaints of upper abdominal pain onset yesterday morning which became progressively worse and was associated with nausea and vomiting. Patient relates that she had a similar episode of pain a couple of weeks previous which was not as bad but a very similar type of pain which lasted for about a week and then gradually resolved.  She did not have nausea and vomiting with that episode. She complains currently of upper abdominal pain that radiates straight through to her back and is severe.  She had fever last evening to 100.0  Hemodynamically stable since arrival  Workup with CT of the abdomen and pelvis shows hepatic steatosis, distended gallbladder with stones but no gallbladder wall thickening, no ductal dilation, there was subtle Abran pancreatic haziness suggestive of pancreatitis.  Upper abdominal ultrasound-diffuse fatty liver, no ductal dilation, CBD 8 mm, no gallbladder wall thickening, cholelithiasis present  MRI/MRCP shows cholelithiasis no gallbladder wall thickening, CBD of 9 mm, no choledocholithiasis, acute pancreatitis with mild edema most likely gallstone pancreatitis, no evidence of necrosis.  Labs on admission last p.m. WBC 9.5/hemoglobin 13.6/hematocrit 40.4/platelets 153 Lipase greater than 2800 Sodium 138/potassium 4.2/glucose 220/BUN 13/creatinine 0.71 T. bili  1.9/alk phos 240/AST 321/ALT 209 Respiratory panel negative Lactate 2.3  This a.m.-potassium 3.3/BUN 12/creatinine 0.7 T. bili 0.9/AST 238/ALT 235/alk phos 269 WBC 8.7/hemoglobin 12/Mehta crit 36.2  Blood cultures pending  Covering with IV Zosyn   Patient had been seen previously by Dr. Eda, with diagnosis of fatty liver disease and noted to have a chronically elevated alkaline phosphatase-per previous notes from 2024 there had been some suggestion previously of portal hypertension but not seen on the most recent imaging, abnormalities on prior imaging may have been related to Hodgkin's lymphoma  No evidence of cirrhosis by ultrasound today   Past Medical History:  Diagnosis Date   AKI (acute kidney injury) 12/17/2017   Allergic rhinitis    Anemia    Arthritis    Asthma    Autonomic neuropathy    Depression    Diabetes mellitus    Diabetes mellitus without complication (HCC)    E coli infection    GERD (gastroesophageal reflux disease)    GERD (gastroesophageal reflux disease)    Gout    Hepatitis A    High cholesterol    Hypertension    Insomnia    Kidney failure    Metabolic encephalopathy    Morbid obesity (HCC)    NASH (nonalcoholic steatohepatitis)    Pneumonia 12/17/2017   Schizophrenia (HCC)    Stroke (HCC)    Vitamin B deficiency    Vitamin D  deficiency     Past Surgical History:  Procedure Laterality Date   CYST EXCISION     buttucks   DENTAL SURGERY     IR IMAGING GUIDED PORT INSERTION  01/10/2022   IR IMAGING GUIDED PORT INSERTION  04/21/2022   IR REMOVAL TUN ACCESS W/ PORT W/O FL MOD SED  02/28/2022   IR REMOVAL TUN ACCESS W/ PORT W/O FL MOD SED  02/02/2023  TONSILLECTOMY      Prior to Admission medications  Medication Sig Start Date End Date Taking? Authorizing Provider  acetaminophen  (TYLENOL ) 500 MG tablet Take 1,000 mg by mouth in the morning and at bedtime.   Yes [provider]  albuterol  (VENTOLIN  HFA) 108 (90 Base) MCG/ACT  inhaler Inhale 2 puffs into the lungs every 4 (four) hours as needed for wheezing or shortness of breath.   Yes [provider]  allopurinol  (ZYLOPRIM ) 100 MG tablet Take 100 mg by mouth daily. 01/19/22  Yes [provider]  amLODipine  (NORVASC ) 10 MG tablet Take 10 mg by mouth daily.   Yes [provider]  ARIPiprazole (ABILIFY) 5 MG tablet Take 5 mg by mouth daily.   Yes [provider]  ASPERCREME LIDOCAINE  4 % Place 1 patch onto the skin See admin instructions. Apply 1 patch to both knees once a day- remove as directed   Yes [provider]  carvedilol  (COREG ) 6.25 MG tablet Take 6.25 mg by mouth 2 (two) times daily with a meal.   Yes [provider]  cholecalciferol  (VITAMIN D3) 25 MCG (1000 UNIT) tablet Take 2,000 Units by mouth daily.   Yes [provider]  doxepin  (SINEQUAN ) 25 MG capsule Take 25 mg by mouth at bedtime.   Yes [provider]  Emollient (EUCERIN BABY EX) Apply 1 application  topically See admin instructions. Apply to affected areas daily where the skin is peeling   Yes [provider]  famotidine  (PEPCID ) 20 MG tablet Take 20 mg by mouth every 12 (twelve) hours.   Yes [provider]  fluticasone  (FLONASE ) 50 MCG/ACT nasal spray Place 1 spray into both nostrils daily.   Yes [provider]  glipiZIDE (GLUCOTROL XL) 10 MG 24 hr tablet Take 10 mg by mouth in the morning and at bedtime. 03/13/22  Yes [provider]  hydrOXYzine  (ATARAX /VISTARIL ) 25 MG tablet Take 25 mg by mouth in the morning and at bedtime.   Yes [provider]  insulin  aspart (NOVOLOG ) 100 UNIT/ML injection Inject 0-12 Units into the skin See admin instructions. Inject 0-12 units into the skin 4 times a day (before meals and at bedtime) per sliding scale: BGL <60 or >400  = CALL MD; 60-200 = 0 units; 201-250 = 2 units; 251-300 = 4 units; 301-350 = 6 units; 351-400 = 8 units; 401-450 = 10 units; >450  = 12 units 07/31/19  Yes Krishnan, Sendil K, MD  LANTUS  SOLOSTAR 100 UNIT/ML Solostar Pen Inject 24-38 Units into the skin See admin instructions. Inject 38 units into the skin in the afternoon and 24 units at night 02/02/22  Yes [provider]  lidocaine  4 % Place 2 patches onto the skin daily. Place 1 patch on each shoulder   Yes [provider]  lidocaine -prilocaine  (EMLA ) cream Apply 1 Application topically as needed. Patient taking differently: Apply 1 Application topically as needed. Apply to port site 1-2 hours prior to chemo- cover with saran wrap and secure with tape 01/16/22  Yes Thayil, Irene T, PA-C  lisinopril  (ZESTRIL ) 5 MG tablet Take 5 mg by mouth daily.   Yes [provider]  loperamide  (IMODIUM ) 2 MG capsule Take 1 capsule (2 mg total) by mouth as needed for diarrhea or loose stools. 09/09/19  Yes Pokhrel, Laxman, MD  loratadine  (CLARITIN ) 10 MG tablet Take 10 mg by mouth daily.   Yes [provider]  mineral oil-hydrophilic petrolatum (AQUAPHOR) ointment Apply 1 Application topically in  the morning and at bedtime.   Yes [provider]  Simethicone  (GAS-X ULTRA STRENGTH) 180 MG CAPS Take 1 capsule by mouth every 8 (eight) hours as needed (gas).   Yes [provider]  sodium chloride  (OCEAN) 0.65 % SOLN nasal spray Place 2 sprays into both nostrils every 2 (two) hours as needed for congestion.   Yes [provider]  SYSTANE BALANCE 0.6 % SOLN Place 2 drops into both eyes every 12 (twelve) hours.   Yes [provider]  SEMGLEE , YFGN, 100 UNIT/ML injection Inject 24 Units into the skin at bedtime. Patient not taking: Reported on 02/23/2024 11/14/22   [provider]    Current Facility-Administered Medications  Medication Dose Route Frequency Provider Last Rate Last Admin   0.9 %  sodium chloride  infusion  250 mL Intravenous PRN Sundil, Subrina, MD       albuterol  (PROVENTIL ) (2.5 MG/3ML) 0.083% nebulizer  solution 2.5 mg  2.5 mg Nebulization Q4H PRN Sundil, Subrina, MD       carvedilol  (COREG ) tablet 6.25 mg  6.25 mg Oral BID WC Sundil, Subrina, MD   6.25 mg at 02/23/24 9061   doxepin  (SINEQUAN ) capsule 25 mg  25 mg Oral QHS Sundil, Subrina, MD       enoxaparin  (LOVENOX ) injection 50 mg  50 mg Subcutaneous Q24H Sundil, Subrina, MD   50 mg at 02/23/24 0945   hydrOXYzine  (ATARAX ) tablet 25 mg  25 mg Oral BID Sundil, Subrina, MD   25 mg at 02/23/24 9061   lactated ringers  infusion   Intravenous Continuous Sundil, Subrina, MD 125 mL/hr at 02/23/24 0430 New Bag at 02/23/24 0430   morphine  (PF) 2 MG/ML injection 1-2 mg  1-2 mg Intravenous Q3H PRN Sundil, Subrina, MD   1 mg at 02/23/24 9056   ondansetron  (ZOFRAN ) tablet 4 mg  4 mg Oral Q6H PRN Sundil, Subrina, MD       Or   ondansetron  (ZOFRAN ) injection 4 mg  4 mg Intravenous Q6H PRN Sundil, Subrina, MD       pantoprazole  (PROTONIX ) injection 40 mg  40 mg Intravenous Q24H Esterwood, Amy S, PA-C       piperacillin -tazobactam (ZOSYN ) IVPB 3.375 g  3.375 g Intravenous Q8H Sundil, Subrina, MD 12.5 mL/hr at 02/23/24 0939 3.375 g at 02/23/24 0939   sodium chloride  flush (NS) 0.9 % injection 3 mL  3 mL Intravenous Q12H Sundil, Subrina, MD   3 mL at 02/23/24 0947   sodium chloride  flush (NS) 0.9 % injection 3 mL  3 mL Intravenous Q12H Sundil, Subrina, MD   3 mL at 02/23/24 0947   sodium chloride  flush (NS) 0.9 % injection 3 mL  3 mL Intravenous PRN Sundil, Subrina, MD       Current Outpatient Medications  Medication Sig Dispense Refill   acetaminophen  (TYLENOL ) 500 MG tablet Take 1,000 mg by mouth in the morning and at bedtime.     albuterol  (VENTOLIN  HFA) 108 (90 Base) MCG/ACT inhaler Inhale 2 puffs into the lungs every 4 (four) hours as needed for wheezing or shortness of breath.     allopurinol  (ZYLOPRIM ) 100 MG tablet Take 100 mg by mouth daily.     amLODipine  (NORVASC ) 10 MG tablet Take 10 mg by mouth daily.     ARIPiprazole (ABILIFY) 5 MG tablet Take 5  mg by mouth daily.     ASPERCREME LIDOCAINE  4 % Place 1 patch onto the skin See admin instructions. Apply 1 patch to both knees once a  day- remove as directed     carvedilol  (COREG ) 6.25 MG tablet Take 6.25 mg by mouth 2 (two) times daily with a meal.     cholecalciferol  (VITAMIN D3) 25 MCG (1000 UNIT) tablet Take 2,000 Units by mouth daily.     doxepin  (SINEQUAN ) 25 MG capsule Take 25 mg by mouth at bedtime.     Emollient (EUCERIN BABY EX) Apply 1 application  topically See admin instructions. Apply to affected areas daily where the skin is peeling     famotidine  (PEPCID ) 20 MG tablet Take 20 mg by mouth every 12 (twelve) hours.     fluticasone  (FLONASE ) 50 MCG/ACT nasal spray Place 1 spray into both nostrils daily.     glipiZIDE (GLUCOTROL XL) 10 MG 24 hr tablet Take 10 mg by mouth in the morning and at bedtime.     hydrOXYzine  (ATARAX /VISTARIL ) 25 MG tablet Take 25 mg by mouth in the morning and at bedtime.     insulin  aspart (NOVOLOG ) 100 UNIT/ML injection Inject 0-12 Units into the skin See admin instructions. Inject 0-12 units into the skin 4 times a day (before meals and at bedtime) per sliding scale: BGL <60 or >400  = CALL MD; 60-200 = 0 units; 201-250 = 2 units; 251-300 = 4 units; 301-350 = 6 units; 351-400 = 8 units; 401-450 = 10 units; >450 = 12 units     LANTUS  SOLOSTAR 100 UNIT/ML Solostar Pen Inject 24-38 Units into the skin See admin instructions. Inject 38 units into the skin in the afternoon and 24 units at night     lidocaine  4 % Place 2 patches onto the skin daily. Place 1 patch on each shoulder     lidocaine -prilocaine  (EMLA ) cream Apply 1 Application topically as needed. (Patient taking differently: Apply 1 Application topically as needed. Apply to port site 1-2 hours prior to chemo- cover with saran wrap and secure with tape) 30 g 0   lisinopril  (ZESTRIL ) 5 MG tablet Take 5 mg by mouth daily.     loperamide  (IMODIUM ) 2 MG capsule Take 1 capsule (2 mg total) by mouth as needed  for diarrhea or loose stools. 30 capsule 0   loratadine  (CLARITIN ) 10 MG tablet Take 10 mg by mouth daily.     mineral oil-hydrophilic petrolatum (AQUAPHOR) ointment Apply 1 Application topically in the morning and at bedtime.     Simethicone  (GAS-X ULTRA STRENGTH) 180 MG CAPS Take 1 capsule by mouth every 8 (eight) hours as needed (gas).     sodium chloride  (OCEAN) 0.65 % SOLN nasal spray Place 2 sprays into both nostrils every 2 (two) hours as needed for congestion.     SYSTANE BALANCE 0.6 % SOLN Place 2 drops into both eyes every 12 (twelve) hours.     SEMGLEE , YFGN, 100 UNIT/ML injection Inject 24 Units into the skin at bedtime. (Patient not taking: Reported on 02/23/2024)     Facility-Administered Medications Ordered in Other Encounters  Medication Dose Route Frequency Provider Last Rate Last Admin   0.9 %  sodium chloride  infusion   Intravenous Continuous Allred, Darrell K, PA-C        Allergies as of 02/22/2024 - Review Complete 02/22/2024  Allergen Reaction Noted   Vancomycin  Itching and Other (See Comments) 09/05/2019   Emend [aprepitant] Other (See Comments) 01/17/2022   Fosaprepitant  Other (See Comments) 01/26/2022   Lasix  [furosemide ] Other (See Comments) 05/07/2018   Lasix  [furosemide ] Other (See Comments) 09/03/2019   Pineapple Rash 04/21/2022    Family History  Adopted: Yes  Problem Relation Age of Onset   COPD Daughter     Social History   Socioeconomic History   Marital status: Unknown    Spouse name: Not on file   Number of children: 2   Years of education: Not on file   Highest education level: Not on file  Occupational History   Occupation: retired/disabled  Tobacco Use   Smoking status: Never   Smokeless tobacco: Never  Vaping Use   Vaping status: Never Used  Substance and Sexual Activity   Alcohol  use: Never   Drug use: Never   Sexual activity: Not Currently    Birth control/protection: None  Other Topics Concern   Not on file  Social History  Narrative   ** Merged History Encounter **       Social Drivers of Health   Tobacco Use: Low Risk (02/23/2024)   Patient History    Smoking Tobacco Use: Never    Smokeless Tobacco Use: Never    Passive Exposure: Not on file  Financial Resource Strain: Not on file  Food Insecurity: No Food Insecurity (05/20/2022)   Hunger Vital Sign    Worried About Running Out of Food in the Last Year: Never true    Ran Out of Food in the Last Year: Never true  Transportation Needs: No Transportation Needs (05/20/2022)   PRAPARE - Administrator, Civil Service (Medical): No    Lack of Transportation (Non-Medical): No  Physical Activity: Not on file  Stress: Not on file  Social Connections: Not on file  Intimate Partner Violence: Not At Risk (05/20/2022)   Humiliation, Afraid, Rape, and Kick questionnaire    Fear of Current or Ex-Partner: No    Emotionally Abused: No    Physically Abused: No    Sexually Abused: No  Depression (PHQ2-9): Not on file  Alcohol  Screen: Not on file  Housing: Low Risk (05/20/2022)   Housing    Last Housing Risk Score: 0  Utilities: Not At Risk (05/20/2022)   AHC Utilities    Threatened with loss of utilities: No  Health Literacy: Not on file    Review of Systems: Pertinent positive and negative review of systems were noted in the above HPI section.  All other review of systems was otherwise negative.   Physical Exam: Vital signs in last 24 hours: Temp:  [98.8 F (37.1 C)-99.9 F (37.7 C)] 99 F (37.2 C) (01/17 0755) Pulse Rate:  [70-90] 84 (01/17 1030) Resp:  [15-26] 18 (01/17 1030) BP: (114-186)/(59-88) 149/81 (01/17 1030) SpO2:  [95 %-100 %] 96 % (01/17 1030) Weight:  [102.1 kg] 102.1 kg (01/16 1953)   General:   Alert,  Well-developed, obese older female ,pleasant and cooperative in NAD, uncomfortable appearing complaining of pain Head:  Normocephalic and atraumatic. Eyes:  Sclera clear, no icterus.   Conjunctiva pink. Ears:  Normal auditory  acuity. Nose:  No deformity, discharge,  or lesions. Mouth:  No deformity or lesions.   Neck:  Supple; no masses or thyromegaly. Lungs:  Clear throughout to auscultation.   No wheezes, crackles, or rhonchi.  Heart:  Regular rate and rhythm; no murmurs, clicks, rubs,  or gallops. Abdomen: Obese, soft, diffusely tender across the upper abdomen with some guarding, no rebound no palpable mass or palpable hepatosplenomegaly, bowel sounds are present Rectal: Not done Msk:  Symmetrical without gross deformities. . Pulses:  Normal pulses noted. Extremities:  Without clubbing or edema. Neurologic:  Alert and  oriented x4;  grossly  normal neurologically. Skin:  Intact without significant lesions or rashes.. Psych:  Alert and cooperative. Normal mood and affect.  Intake/Output from previous day: No intake/output data recorded. Intake/Output this shift: No intake/output data recorded.  Lab Results: Recent Labs    02/22/24 2123 02/22/24 2126 02/23/24 0428  WBC  --  9.5 8.7  HGB 15.0 13.6 12.0  HCT 44.0 40.4 36.2  PLT  --  153 141*   BMET Recent Labs    02/22/24 2022 02/22/24 2123 02/23/24 0428  NA 138 140 140  K 4.2 4.0 3.3*  CL 102 100 102  CO2 22  --  27  GLUCOSE 220* 225* 127*  BUN 13 15 12   CREATININE 0.71 0.60 0.70  CALCIUM  9.7  --  9.3   LFT Recent Labs    02/23/24 0428  PROT 6.5  ALBUMIN 3.8  AST 238*  ALT 235*  ALKPHOS 269*  BILITOT 0.9   PT/INR No results for input(s): LABPROT, INR in the last 72 hours. Hepatitis Panel No results for input(s): HEPBSAG, HCVAB, HEPAIGM, HEPBIGM in the last 72 hours.   IMPRESSION:  #37 74 year old female presenting with acute progressively severe epigastric pain radiating to the back and associated with nausea and vomiting onset yesterday.  Patient endorsed a similar episode which occurred a couple of weeks ago and lasted for period of days and then resolved which was not as severe and not associated with  vomiting.  Workup here is consistent with acute pancreatitis.  She has had extensive imaging including CT/ultrasound and MRI/MRCP. She has cholelithiasis but no evidence of acute cholecystitis, and on MRCP there is no evidence of choledocholithiasis.  Suspect she may have passed a small stone or sludge.  No worrisome criteria for severe pancreatitis at present  Pain control has been an issue.  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  Morbid obesity 5.  Insulin -dependent diabetes mellitus 6.  Prior CVA 7.  History of schizophrenia 7.  Asthma  Plan; okay to try clear liquids She has received lactated Ringer 's bolus, continue fluids/lactated Ringer 's at 125 4-hour Labs stable suggesting we are keeping up Agree with IV Zosyn  Pain control and IV antiemetics as needed Manage supportively for now, once pancreatitis cools down she will need surgical consult to discuss laparoscopic cholecystectomy GI will follow with you-[facility] covering for Dr. Kristie this weekend   [facility]  02/23/2024, 11:09 AM     Attending physician's note   I have taken history, reviewed the chart and examined the patient. I performed a substantive portion of this encounter, including complete performance of at least one of the key components, in conjunction with the APP. I agree with the Advanced Practitioner's note, impression and recommendations.   Acute GS pancreatitis-much better.  Looks like she has passed the stone given neg MRCP and LFTs trending down. 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   Plan: -Clear liquid diet.  Advance as tolerated -IVF/pain control/nausea control -Agree with IV Zosyn  -Trend CBC, CMP, lipase -No indication for ERCP -Do recommend lap chole once better.  Preferably this hospitalization.   Anselm Bring, MD [facility] [phone]  "

## 2024-02-23 NOTE — Plan of Care (Signed)
 Patient is currently n.p.o.and blood glucose has been improved to 102 100 without any intervention.   Holding Lantus  15 unit.  Shaneca Orne, MD [facility] 02/23/2024, 4:59 AM

## 2024-02-23 NOTE — ED Notes (Signed)
 Patient transported to MRI at this time.

## 2024-02-23 NOTE — ED Notes (Signed)
 Pt is a very hard stick unable to draw lab RN aware

## 2024-02-23 NOTE — Progress Notes (Signed)
 The patient is a 74 yr old woman who presents with complaints of abdominal pain, fever, and vomiting beginning on the morning of 02/22/2024.    In the ED the patient was found to have a temperature of 100.2 and BP 182/80.  Lipase was greater than 2800. AST 321 and ALT was 209. T bili was 1.9. Blood cultures x 2 were drawn.  CT abdomen and pelvis demonstrated peripancreatic haziness, distended gallbladder with small stones, diverticular disease without acute wall thickening, and aortic atherosclerosis.   The patient has a past medical history significant for Hodgkin lymphoma stage III s/p completed chemotherapy, sacral ulcer, chronic transaminitis, hepatic steatosis/possible cirrhosis, asthma, insulin -dependent DM type II, morbid obesity, schizophrenia, hyperlipidemia, osteoarthritis, depression, GERD, essential hypertension, insomnia, and history of CVA .  The patient was started on zosyn  and admitted to a progressive bed by my colleague Dr. Sundil. Gastroenterology was consulted.  The patient is resting comfortably upon my visit. No further vomiting. No further fevers. Heart and lung exam was within normal limits. Abdomen is soft, but tender in the epigastrum. Hypoactive bowel sounds.  The patient has been evaluated by Dr. Charlanne from Gastroenterology. He believes that the patient has likely passed the stone. He recommends that she have a cholecystectomy prior to discharge.

## 2024-02-23 NOTE — ED Notes (Signed)
 Patient was stuck twice for blood culture unable to collect due to poor vascularity

## 2024-02-23 NOTE — ED Provider Notes (Signed)
 Care of patient received from prior provider at 5:34 AM, please see their note for complete H/P and care plan.  Received handoff per ED course.  Clinical Course as of 02/23/24 0534  Fri Feb 22, 2024  2259 Stable HO NP HX of lymphoma. NV Abdominal pain CT pending Hx of pancreatitis [CC]    Clinical Course User Index [CC] Jerral Meth, MD  CRITICAL CARE Performed by: Meth Jerral   Total critical care time: 30 minutes  Critical care time was exclusive of separately billable procedures and treating other patients.  Critical care was necessary to treat or prevent imminent or life-threatening deterioration.  Critical care was time spent personally by me on the following activities: development of treatment plan with patient and/or surrogate as well as nursing, discussions with consultants, evaluation of patient's response to treatment, examination of patient, obtaining history from patient or surrogate, ordering and performing treatments and interventions, ordering and review of laboratory studies, ordering and review of radiographic studies, pulse oximetry and re-evaluation of patient's condition.   Reassessment: Patient's workup demonstrated pretty severe pancreatitis with likely choledocholithiasis though not confirmed on CT or ultrasound.  Will order an MRI for further differentiation.  Consulted gastroenterology for a.m. evaluation given hepatobiliary injury.  Disposition:   Based on the above findings, I believe this patient is stable for admission.    Patient/family educated about specific findings on our evaluation and explained exact reasons for admission.  Patient/family educated about clinical situation and time was allowed to answer questions.   Admission team communicated with and agreed with need for admission. Patient admitted. Patient ready to move at this time.     Emergency Department Medication Summary:   Medications  piperacillin -tazobactam (ZOSYN ) IVPB  3.375 g (has no administration in time range)  lactated ringers  infusion ( Intravenous New Bag/Given 02/23/24 0430)  doxepin  (SINEQUAN ) capsule 25 mg (has no administration in time range)  hydrOXYzine  (ATARAX ) tablet 25 mg (has no administration in time range)  enoxaparin  (LOVENOX ) injection 50 mg (has no administration in time range)  sodium chloride  flush (NS) 0.9 % injection 3 mL (3 mLs Intravenous Not Given 02/23/24 0443)  sodium chloride  flush (NS) 0.9 % injection 3 mL (3 mLs Intravenous Not Given 02/23/24 0442)  sodium chloride  flush (NS) 0.9 % injection 3 mL (has no administration in time range)  0.9 %  sodium chloride  infusion (has no administration in time range)  ibuprofen  (ADVIL ) tablet 400 mg (has no administration in time range)  ondansetron  (ZOFRAN ) tablet 4 mg (has no administration in time range)    Or  ondansetron  (ZOFRAN ) injection 4 mg (has no administration in time range)  albuterol  (PROVENTIL ) (2.5 MG/3ML) 0.083% nebulizer solution 2.5 mg (has no administration in time range)  carvedilol  (COREG ) tablet 6.25 mg (6.25 mg Oral Not Given 02/23/24 0416)  morphine  (PF) 2 MG/ML injection 1-2 mg (1 mg Intravenous Given 02/23/24 0430)  HYDROmorphone  (DILAUDID ) injection 0.5 mg (0.5 mg Intravenous Given 02/22/24 2217)  lactated ringers  bolus 1,000 mL (0 mLs Intravenous Stopped 02/23/24 0100)  iohexol  (OMNIPAQUE ) 350 MG/ML injection 80 mL (80 mLs Intravenous Contrast Given 02/22/24 2321)  piperacillin -tazobactam (ZOSYN ) IVPB 3.375 g (0 g Intravenous Stopped 02/23/24 0234)  gadobutrol  (GADAVIST ) 1 MMOL/ML injection 10 mL (10 mLs Intravenous Contrast Given 02/23/24 0341)            Jerral Meth, MD 02/23/24 [phone]

## 2024-02-23 NOTE — ED Notes (Addendum)
 Bed linens changed, new brief applied, peri care performed, small excoriated area noted to left side sacrum, mepilex applied

## 2024-02-23 NOTE — ED Notes (Signed)
 Patient cleaned, new linens placed on bed and patient provided with new, warm blankets. Patient denies any needs/complaints at this time. Bed locked and in lowest position with call bell within reach.

## 2024-02-23 NOTE — ED Notes (Signed)
 Patient transported to [place] at this time.

## 2024-02-24 DIAGNOSIS — E119 Type 2 diabetes mellitus without complications: Secondary | ICD-10-CM | POA: Diagnosis not present

## 2024-02-24 DIAGNOSIS — Z794 Long term (current) use of insulin: Secondary | ICD-10-CM | POA: Diagnosis not present

## 2024-02-24 DIAGNOSIS — K7581 Nonalcoholic steatohepatitis (NASH): Secondary | ICD-10-CM | POA: Diagnosis not present

## 2024-02-24 DIAGNOSIS — Z8673 Personal history of transient ischemic attack (TIA), and cerebral infarction without residual deficits: Secondary | ICD-10-CM | POA: Diagnosis not present

## 2024-02-24 DIAGNOSIS — K8020 Calculus of gallbladder without cholecystitis without obstruction: Secondary | ICD-10-CM | POA: Diagnosis not present

## 2024-02-24 DIAGNOSIS — Z8571 Personal history of Hodgkin lymphoma: Secondary | ICD-10-CM | POA: Diagnosis not present

## 2024-02-24 DIAGNOSIS — K851 Biliary acute pancreatitis without necrosis or infection: Secondary | ICD-10-CM | POA: Diagnosis not present

## 2024-02-24 LAB — CBC WITH DIFFERENTIAL/PLATELET
Abs Immature Granulocytes: 0.05 K/uL (ref 0.00–0.07)
Basophils Absolute: 0 K/uL (ref 0.0–0.1)
Basophils Relative: 0 %
Eosinophils Absolute: 0.2 K/uL (ref 0.0–0.5)
Eosinophils Relative: 2 %
HCT: 32.5 % — ABNORMAL LOW (ref 36.0–46.0)
Hemoglobin: 10.6 g/dL — ABNORMAL LOW (ref 12.0–15.0)
Immature Granulocytes: 1 %
Lymphocytes Relative: 22 %
Lymphs Abs: 2 K/uL (ref 0.7–4.0)
MCH: 32.1 pg (ref 26.0–34.0)
MCHC: 32.6 g/dL (ref 30.0–36.0)
MCV: 98.5 fL (ref 80.0–100.0)
Monocytes Absolute: 0.3 K/uL (ref 0.1–1.0)
Monocytes Relative: 3 %
Neutro Abs: 6.9 K/uL (ref 1.7–7.7)
Neutrophils Relative %: 72 %
Platelets: DECREASED K/uL (ref 150–400)
RBC: 3.3 MIL/uL — ABNORMAL LOW (ref 3.87–5.11)
RDW: 16.7 % — ABNORMAL HIGH (ref 11.5–15.5)
Smear Review: NORMAL
WBC: 9.4 K/uL (ref 4.0–10.5)
nRBC: 0.3 % — ABNORMAL HIGH (ref 0.0–0.2)

## 2024-02-24 LAB — BASIC METABOLIC PANEL WITH GFR
Anion gap: 11 (ref 5–15)
BUN: 12 mg/dL (ref 8–23)
CO2: 24 mmol/L (ref 22–32)
Calcium: 8.3 mg/dL — ABNORMAL LOW (ref 8.9–10.3)
Chloride: 102 mmol/L (ref 98–111)
Creatinine, Ser: 0.83 mg/dL (ref 0.44–1.00)
GFR, Estimated: >60 mL/min
Glucose, Bld: 111 mg/dL — ABNORMAL HIGH (ref 70–99)
Potassium: 3.8 mmol/L (ref 3.5–5.1)
Sodium: 137 mmol/L (ref 135–145)

## 2024-02-24 LAB — HEPATIC FUNCTION PANEL
ALT: 120 U/L — ABNORMAL HIGH (ref 0–44)
AST: 75 U/L — ABNORMAL HIGH (ref 15–41)
Albumin: 3 g/dL — ABNORMAL LOW (ref 3.5–5.0)
Alkaline Phosphatase: 157 U/L — ABNORMAL HIGH (ref 38–126)
Bilirubin, Direct: 0.1 mg/dL (ref 0.0–0.2)
Indirect Bilirubin: 0.6 mg/dL (ref 0.3–0.9)
Total Bilirubin: 0.7 mg/dL (ref 0.0–1.2)
Total Protein: 5.5 g/dL — ABNORMAL LOW (ref 6.5–8.1)

## 2024-02-24 LAB — LIPASE, BLOOD: Lipase: 63 U/L — ABNORMAL HIGH (ref 11–51)

## 2024-02-24 LAB — MRSA NEXT GEN BY PCR, NASAL: MRSA by PCR Next Gen: DETECTED — AB

## 2024-02-24 MED ORDER — MUPIROCIN 2 % EX OINT
1.0000 | TOPICAL_OINTMENT | Freq: Two times a day (BID) | CUTANEOUS | Status: DC
  Administered 2024-02-24 – 2024-02-27 (×6): 1 via NASAL
  Filled 2024-02-24 (×2): qty 22

## 2024-02-24 MED ORDER — SODIUM CHLORIDE 0.9 % IV SOLN: INTRAVENOUS | Status: AC | PRN

## 2024-02-24 MED ORDER — CHLORHEXIDINE GLUCONATE CLOTH 2 % EX PADS
6.0000 | MEDICATED_PAD | Freq: Every day | CUTANEOUS | Status: DC
  Administered 2024-02-25 – 2024-02-26 (×3): 6 via TOPICAL

## 2024-02-24 NOTE — Progress Notes (Signed)
 " Progress Note   Patient: Heather Petty FMW:[id number] DOB: 09/10/1950 DOA: 02/22/2024     1 DOS: the patient was seen and examined on 03-20-24   Brief hospital course: The patient is a 74 yr old woman who presents with complaints of abdominal pain, fever, and vomiting beginning on the morning of 02/22/2024.     In the ED the patient was found to have a temperature of 100.2 and BP 182/80.  Lipase was greater than 2800. AST 321 and ALT was 209. T bili was 1.9. Blood cultures x 2 were drawn.   CT abdomen and pelvis demonstrated peripancreatic haziness, distended gallbladder with small stones, diverticular disease without acute wall thickening, and aortic atherosclerosis.    The patient has a past medical history significant for Hodgkin lymphoma stage III s/p completed chemotherapy, sacral ulcer, chronic transaminitis, hepatic steatosis/possible cirrhosis, asthma, insulin -dependent DM type II, morbid obesity, schizophrenia, hyperlipidemia, osteoarthritis, depression, GERD, essential hypertension, insomnia, and history of CVA .   The patient was started on zosyn  and admitted to a progressive bed by my colleague Dr. Sundil. Gastroenterology was consulted. He feels that she has passed her stone  On 03/20/24 the patient is feeling better. She is ready to advance her diet. General Surgery has been asked to see her for evaluation for cholecystectomy on 02/25/2024.  Assessment and Plan: Acute gallstone pancreatitis Transaminitis History of abnormal liver function/hepatic steatosis Concern for early development of acute cholangitis --The patient was admitted to a progressive bed.  --She was started on IV Zosyn .  --Blood Cultures x 2 were obtained.  --MRCP demonstrated Acute pancreatitis with mild edema , most likely due to gallstone pancreatitis. Cholelithiasis with mild fusiform dilatation of the common bile duct to 9 mm and no choledocholithiasis --- CT abdomen pelvis showed subtle  peripancreatic haziness, correlate with laboratory values for possible pancreatitis. Distended gallbladder with small stones. Diverticular disease of the colon without acute wall thickening. - Right upper quadrant ultrasound showed cholelithiasis without evidence of acute cholecystitis.  Normal biliary ductal diameter and no intrahepatic ductal dilation. -- CMP showing elevated blood glucose 220, elevated AST/ALT/ALP 321/209/240 and elevated bilirubin 1.9.  Elevated lipase level 2800. UA unremarkable.  Elevated lactic acid level 2.3. -Triglyceride only slightly increased at 16=53 --Patient improving. Will advance diet today.  -Continue pain control and nausea control. -Continue to monitor liver function and avoid hepatotoxic agents. - General surgery consulted to evaluate the patient for cholelithiasis.  History of Hodgkin lymphoma -History of Hodgkin lymphoma stage III status post chemotherapy and currently in remission follows oncology.  Elevated lactic acid 2.3 -Resolved to 1.5 with IV fluids. Patient did not meet sepsis criteria.   Essential hypertension -Continue Coreg  6.25 mg twice daily.   Insulin -dependent DM type II As patient is n.p.o. holding short acting insulin .  Decreasing long-acting insulin  24 units to 15 units in the setting of n.p.o. status.  --Glucoses have been running 84 - 181 in the last 24 hours.    History of schizophrenia -Continue doxepin  and Atarax    History of sacral ulcer -Continue turn patient every 2 hours and air mattress   History of asthma -Stable.  Continue albuterol  as needed     DVT prophylaxis:  Lovenox  Code Status:  Full Code Diet: N.p.o. Family Communication:  None available Disposition Plan: TBD Consults: Gastroenterology, General Surgery Admission status:   Inpatient, Progressive     Subjective: The patient states that her abdominal pain is improved. She wants to advance her diet. She is agreeable to surgical  consult.  Physical  Exam: Vitals:   02/24/24 0530 02/24/24 0646 02/24/24 0727 02/24/24 1237  BP: (!) 146/59 95/77 123/65 97/77  Pulse: 82 75 66 73  Resp: (!) 32 20 20 17   Temp:  99 F (37.2 C) 98.6 F (37 C) 98.2 F (36.8 C)  TempSrc:  Oral Oral Oral  SpO2: 96% 95% 95% 95%  Weight:      Height:       Exam:  Constitutional:  The patient is awake, alert, and oriented x 3. No acute distress. Eyes:  pupils and irises appear normal Normal lids and conjunctivae ENMT:  grossly normal hearing  Lips appear normal external ears, nose appear normal Oropharynx: mucosa, tongue,posterior pharynx appear normal Neck:  neck appears normal, no masses, normal ROM, supple no thyromegaly Respiratory:  No increased work of breathing. No wheezes, rales, or rhonchi No tactile fremitus Cardiovascular:  Regular rate and rhythm No murmurs, ectopy, or gallups. No lateral PMI. No thrills. Abdomen:  Abdomen is soft, somewhat tender in the epigastrum, somewhat-distended No hernias, masses, or organomegaly Hypoactive bowel sounds.  Musculoskeletal:  No cyanosis, clubbing, or edema Skin:  No rashes, lesions, ulcers palpation of skin: no induration or nodules Neurologic:  CN 2-12 intact Sensation all 4 extremities intact Psychiatric:  Mental status Mood, affect appropriate Orientation to person, place, time  judgment and insight appear intact  Data Reviewed:  CBC,CMP, MRCP, CT, Triglycerides   Family Communication: None available  Disposition: Status is: Inpatient Remains inpatient appropriate because: Need for Cholecystectomy  Planned Discharge Destination: TBD    Time spent: 42 minutes  Author: Kenda Kloehn, DO 02/24/2024 4:18 PM  For on call review [email or web address].  "

## 2024-02-24 NOTE — ED Notes (Signed)
 Phlebotomy at bedside.

## 2024-02-24 NOTE — ED Notes (Signed)
 Patient resting with eyes closed. Patient ABC's intact. Chest rise and fall noted. Call bell within reach. Bed in lowest position and locked. Patient denies further needs at this time.

## 2024-02-24 NOTE — ED Notes (Signed)
 Pt provided pain medication for stomach pain. Pericare and brief change

## 2024-02-24 NOTE — Progress Notes (Addendum)
 Patient ID: Heather Petty, female   DOB: 06-Apr-1950, 74 y.o.   MRN: [id number]    Progress Note   Subjective  Day # 2 CC; acute pancreatitis, concern for choledocholithiasis  MRCP negative for choledocholithiasis  Labs today--lipase 63 Blood cultures negative thus far WBC 9.4/Hb 10.6/hematocrit 32.5 T bili 0.7/alk phos 157/AST 75/ALT 120-improved BUN 12/creatinine 0.83  Tmax 100  Patient still uncomfortable but says her pain is about a 4 out of 10 today as compared to 10 out of 10 yesterday.  No nausea or vomiting, sipping on liquids and feels she could tolerate some advancement in her diet.   Objective   Vital signs in last 24 hours: Temp:  [98.6 F (37 C)-100 F (37.8 C)] 98.6 F (37 C) (01/18 0727) Pulse Rate:  [65-85] 66 (01/18 0727) Resp:  [19-32] 20 (01/18 0727) BP: (95-149)/(49-95) 123/65 (01/18 0727) SpO2:  [94 %-99 %] 95 % (01/18 0727)   General:   Older female in NAD, morbidly obese Heart:  Regular rate and rhythm; no murmurs Lungs: Respirations even and unlabored, decreased breath sounds bilateral bases Abdomen:  Soft, obese, tender across the epigastrium/hypogastrium, no rebound, bowel sounds are present Extremities:  Without edema. Neurologic:  Alert and oriented,  grossly normal neurologically. Psych:  Cooperative. Normal mood and affect.  Intake/Output from previous day: 01/17 0701 - 01/18 0700 In: 100 [IV Piggyback:100] Out: -  Intake/Output this shift: Total I/O In: 118 [P.O.:118] Out: -   Lab Results: Recent Labs    02/22/24 2126 02/23/24 0428 02/24/24 0229  WBC 9.5 8.7 9.4  HGB 13.6 12.0 10.6*  HCT 40.4 36.2 32.5*  PLT 153 141* PLATELET CLUMPS NOTED ON SMEAR, COUNT APPEARS DECREASED   BMET Recent Labs    02/22/24 2022 02/22/24 2123 02/23/24 0428 02/24/24 0229  NA 138 140 140 137  K 4.2 4.0 3.3* 3.8  CL 102 100 102 102  CO2 22  --  27 24  GLUCOSE 220* 225* 127* 111*  BUN 13 15 12 12   CREATININE 0.71 0.60 0.70 0.83  CALCIUM   9.7  --  9.3 8.3*   LFT Recent Labs    02/24/24 0229  PROT 5.5*  ALBUMIN 3.0*  AST 75*  ALT 120*  ALKPHOS 157*  BILITOT 0.7  BILIDIR 0.1  IBILI 0.6   PT/INR No results for input(s): LABPROT, INR in the last 72 hours.  Studies/Results: MR ABDOMEN MRCP W WO CONTRAST Result Date: 02/23/2024 EXAM: MRCP WITH AND WITHOUT IV CONTRAST 02/23/2024 03:50:00 AM TECHNIQUE: Multisequence, multiplanar magnetic resonance images of the abdomen with and without intravenous contrast. MRCP sequences were performed. 10 cc of gadobutrol  (GADAVIST ) 1 MMOL/ML injection was administered. COMPARISON: CT abdomen and pelvis 02/22/2024 and US  abdomen left limited 02/23/2024. CLINICAL HISTORY: Cholelithiasis; Pancreatitis, acute, severe. FINDINGS: LIVER: Diffuse hepatic steatosis. No focal liver lesion identified. GALLBLADDER AND BILIARY SYSTEM: The gallbladder is distended. Gallstones are noted layering within the dependent portion of the gallbladder. These stones measure up to 3 mm. No gallbladder wall thickening or pericholecystic fat stranding or free fluid. Mild fusiform dilatation of the common bile duct measures up to 9 mm. No choledocholithiasis. No intrahepatic ductal dilation. SPLEEN: Unremarkable. PANCREAS/PANCREATIC DUCT: Mild edema involving the pancreas. No main duct dilatation. No signs of pancreatic necrosis or pseudocyst. ADRENAL GLANDS: Unremarkable. KIDNEYS: A 9 mm Bosniak class I cyst arises off the inferior pole of the left kidney. LYMPH NODES: The retroperitoneal lymph node measures 1.1 cm. VASCULATURE: Aortic atherosclerotic calcification. PERITONEUM: No ascites. ABDOMINAL WALL: No  hernia. No mass. BOWEL: Grossly unremarkable. No bowel obstruction. BONES: No abnormality. SOFT TISSUES: Unremarkable. IMPRESSION: 1. Acute pancreatitis with mild edema, most likely gallstone pancreatitis (possible recently passed stone), without main duct dilatation, necrosis, or pseudocyst. 2. Cholelithiasis with mild  fusiform dilatation of the common bile duct to 9 mm and no choledocholithiasis. Electronically signed by: Waddell Calk MD 02/23/2024 05:22 AM EST RP Workstation: [id number]   MR 3D Recon At Scanner Result Date: 02/23/2024 EXAM: MRCP WITH AND WITHOUT IV CONTRAST 02/23/2024 03:50:00 AM TECHNIQUE: Multisequence, multiplanar magnetic resonance images of the abdomen with and without intravenous contrast. MRCP sequences were performed. 10 cc of gadobutrol  (GADAVIST ) 1 MMOL/ML injection was administered. COMPARISON: CT abdomen and pelvis 02/22/2024 and US  abdomen left limited 02/23/2024. CLINICAL HISTORY: Cholelithiasis; Pancreatitis, acute, severe. FINDINGS: LIVER: Diffuse hepatic steatosis. No focal liver lesion identified. GALLBLADDER AND BILIARY SYSTEM: The gallbladder is distended. Gallstones are noted layering within the dependent portion of the gallbladder. These stones measure up to 3 mm. No gallbladder wall thickening or pericholecystic fat stranding or free fluid. Mild fusiform dilatation of the common bile duct measures up to 9 mm. No choledocholithiasis. No intrahepatic ductal dilation. SPLEEN: Unremarkable. PANCREAS/PANCREATIC DUCT: Mild edema involving the pancreas. No main duct dilatation. No signs of pancreatic necrosis or pseudocyst. ADRENAL GLANDS: Unremarkable. KIDNEYS: A 9 mm Bosniak class I cyst arises off the inferior pole of the left kidney. LYMPH NODES: The retroperitoneal lymph node measures 1.1 cm. VASCULATURE: Aortic atherosclerotic calcification. PERITONEUM: No ascites. ABDOMINAL WALL: No hernia. No mass. BOWEL: Grossly unremarkable. No bowel obstruction. BONES: No abnormality. SOFT TISSUES: Unremarkable. IMPRESSION: 1. Acute pancreatitis with mild edema, most likely gallstone pancreatitis (possible recently passed stone), without main duct dilatation, necrosis, or pseudocyst. 2. Cholelithiasis with mild fusiform dilatation of the common bile duct to 9 mm and no choledocholithiasis.  Electronically signed by: Waddell Calk MD 02/23/2024 05:22 AM EST RP Workstation: [id number]   US  Abdomen Limited RUQ (LIVER/GB) Result Date: 02/23/2024 EXAM: Right Upper Quadrant Abdominal Ultrasound 02/23/2024 01:35:32 AM TECHNIQUE: Real-time ultrasonography of the right upper quadrant of the abdomen was performed. COMPARISON: CT from the previous day. CLINICAL HISTORY: Right upper quadrant pain. FINDINGS: LIVER: Diffuse increased echogenicity is noted within the liver consistent with fatty infiltration. No intrahepatic biliary ductal dilatation is seen. No evidence of mass. Hepatopetal flow in the portal vein. BILIARY SYSTEM: The gallbladder is well distended. No wall thickening or pericholecystic fluid is noted. Cholelithiasis is seen without complicating factors. The common bile duct measures 8.8 mm which is mildly prominent for the patient's given age. Normal tapering distally is noted. IMPRESSION: 1. Cholelithiasis without sonographic evidence of acute cholecystitis. 2. Mildly prominent common bile duct measuring 8.8 mm with normal distal tapering and no intrahepatic ductal dilatation. Electronically signed by: Oneil Devonshire MD 02/23/2024 01:44 AM EST RP Workstation: [id number]   CT ABDOMEN PELVIS W CONTRAST Result Date: 02/23/2024 CLINICAL DATA:  Generalized abdominal pain EXAM: CT ABDOMEN AND PELVIS WITH CONTRAST TECHNIQUE: Multidetector CT imaging of the abdomen and pelvis was performed using the standard protocol following bolus administration of intravenous contrast. RADIATION DOSE REDUCTION: This exam was performed according to the departmental dose-optimization program which includes automated exposure control, adjustment of the mA and/or kV according to patient size and/or use of iterative reconstruction technique. CONTRAST:  80mL OMNIPAQUE  IOHEXOL  350 MG/ML SOLN COMPARISON:  CT 11/12/2023, 05/10/2023 FINDINGS: Lower chest: Lung bases demonstrate no acute airspace disease. Cardiomegaly and  coronary vascular calcification. Hepatobiliary: Hepatic steatosis. Distended gallbladder with small stones. No  biliary dilatation Pancreas: Fatty atrophy. Motion degradation, but question subtle peripancreatic haziness Spleen: Normal in size without focal abnormality. Adrenals/Urinary Tract: Adrenal glands are within normal limits. Kidneys show no hydronephrosis. The bladder is unremarkable Stomach/Bowel: Stomach within normal limits. No dilated small bowel. Diverticular disease of the colon without acute wall thickening Vascular/Lymphatic: Aortic atherosclerosis. No aneurysm. Subcentimeter retroperitoneal lymph nodes Reproductive: Uterus and bilateral adnexa are unremarkable. Other: No ascities, or free air. Musculoskeletal: No fracture is seen.Multilevel degenerative change. IMPRESSION: 1. Motion degradation limits evaluation. 2. Question subtle peripancreatic haziness, correlate with laboratory values for possible pancreatitis. 3. Distended gallbladder with small stones. This may be correlated with RUQ US  as indicated. 4. Diverticular disease of the colon without acute wall thickening. 5. Aortic atherosclerosis. Aortic Atherosclerosis (ICD10-I70.0). Electronically Signed   By: Luke Bun M.D.   On: 02/23/2024 00:03   DG Chest Portable 1 View Result Date: 02/22/2024 EXAM: 1 VIEW(S) XRAY OF THE CHEST 02/22/2024 09:31:00 PM COMPARISON: Chest x-ray 05/18/2022. CLINICAL HISTORY: cough, shortness of breath FINDINGS: LUNGS AND PLEURA: Low lung volumes. Streaky opacities at left lung base. No pleural effusion. No pneumothorax. HEART AND MEDIASTINUM: Aortic atherosclerosis. No acute abnormality of the cardiac and mediastinal silhouettes. BONES AND SOFT TISSUES: No acute osseous abnormality. IMPRESSION: 1. Streaky left basilar opacities, favored atelectasis in the setting of low lung volumes. 2. Aortic atherosclerosis. Electronically signed by: Greig Pique MD 02/22/2024 09:46 PM EST RP Workstation: [id number]        Assessment / Plan:    #81 74 year old female admitted through the emergency room yesterday after she presented with abrupt onset of severe upper abdominal pain radiating to the back associated with nausea and vomiting.  Had related a similar but less severe episode about 2-3 weeks previous.  Workup in the ER consistent with acute pancreatitis with initial lipase greater than 2800  Imaging with CT, ultrasound and MRI MRCP all consistent with cholelithiasis without cholecystitis.  No choledocholithiasis on MRCP  Suspect she passed a small stone sip attaining pancreatitis  No worrisome criteria for severe pancreatitis  Patient has improved over the past 24 hours pain improving, nausea and vomiting resolved tolerating clear liquids  #2 insulin  dependent diabetes mellitus #3.  Prior history of CVA #4.  History of Hodgkin's lymphoma status post chemotherapy completed 2024 #5.  Underlying MASH/NASH with diffuse hepatic steatosis and chronically elevated alkaline phosphatase #6 schizophrenia #7.  Asthma  Plan; full liquid diet as tolerates Continue to trend daily labs Continue Zosyn  today, stop tomorrow if blood cultures negative Start incentive spirometry  Patient more amenable to considering laparoscopic cholecystectomy today.  Please contact surgery tomorrow to discuss laparoscopic cholecystectomy, possibly prior to discharge.     Principal Problem:   Acute gallstone pancreatitis Active Problems:   HTN (hypertension)   Hyperlipidemia associated with type 2 diabetes mellitus (HCC)   DM II (diabetes mellitus, type II), controlled (HCC)   Schizophrenia (HCC)   Hodgkin lymphoma (HCC)   Cholelithiasis   Chronic transaminitis/hepatic steatosis   Sacral ulcer (HCC)   History of asthma     LOS: 1 day   Amy Esterwood PA-C 02/24/2024, 11:25 AM    Attending physician's note   I have taken history, reviewed the chart and examined the patient. I performed a substantive  portion of this encounter, including complete performance of at least one of the key components, in conjunction with the APP. I agree with the Advanced Practitioner's note, impression and recommendations.   Cross cover for Dr. Sybil  Acute gallstone  pancreatitis-she appears to have passed the stone as evident by decreasing LFTs, lipase, negative MRCP.US -gallstones.  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 -Continue Zosyn . -Trend CBC, CMP, lipase. -No indication for ERCP. -Ambulate/incentive spirometry -Do recommend surgical consultation for lap chole. Pl call surgery tomorrow -Will sign off to Dr. Rollin.    Anselm Bring, MD [facility] [phone]

## 2024-02-24 NOTE — ED Notes (Signed)
 RN called IP floor to verify pt room status. RN phone number was provided and IP RN will call back shortly

## 2024-02-24 NOTE — Plan of Care (Signed)

## 2024-02-24 NOTE — Plan of Care (Signed)

## 2024-02-25 ENCOUNTER — Encounter (HOSPITAL_COMMUNITY): Payer: Self-pay | Admitting: Internal Medicine

## 2024-02-25 DIAGNOSIS — K851 Biliary acute pancreatitis without necrosis or infection: Secondary | ICD-10-CM | POA: Diagnosis not present

## 2024-02-25 LAB — HEMOGLOBIN A1C
Hgb A1c MFr Bld: 7.2 % — ABNORMAL HIGH (ref 4.8–5.6)
Mean Plasma Glucose: 159.94 mg/dL

## 2024-02-25 LAB — COMPREHENSIVE METABOLIC PANEL WITH GFR
ALT: 82 U/L — ABNORMAL HIGH (ref 0–44)
AST: 28 U/L (ref 15–41)
Albumin: 3.2 g/dL — ABNORMAL LOW (ref 3.5–5.0)
Alkaline Phosphatase: 161 U/L — ABNORMAL HIGH (ref 38–126)
Anion gap: 10 (ref 5–15)
BUN: 8 mg/dL (ref 8–23)
CO2: 26 mmol/L (ref 22–32)
Calcium: 8.6 mg/dL — ABNORMAL LOW (ref 8.9–10.3)
Chloride: 100 mmol/L (ref 98–111)
Creatinine, Ser: 0.66 mg/dL (ref 0.44–1.00)
GFR, Estimated: >60 mL/min
Glucose, Bld: 233 mg/dL — ABNORMAL HIGH (ref 70–99)
Potassium: 3.4 mmol/L — ABNORMAL LOW (ref 3.5–5.1)
Sodium: 136 mmol/L (ref 135–145)
Total Bilirubin: 0.6 mg/dL (ref 0.0–1.2)
Total Protein: 5.9 g/dL — ABNORMAL LOW (ref 6.5–8.1)

## 2024-02-25 LAB — CBC WITH DIFFERENTIAL/PLATELET
Abs Immature Granulocytes: 0.03 K/uL (ref 0.00–0.07)
Basophils Absolute: 0 K/uL (ref 0.0–0.1)
Basophils Relative: 0 %
Eosinophils Absolute: 0.2 K/uL (ref 0.0–0.5)
Eosinophils Relative: 2 %
HCT: 30.5 % — ABNORMAL LOW (ref 36.0–46.0)
Hemoglobin: 10.2 g/dL — ABNORMAL LOW (ref 12.0–15.0)
Immature Granulocytes: 0 %
Lymphocytes Relative: 25 %
Lymphs Abs: 1.9 K/uL (ref 0.7–4.0)
MCH: 32.6 pg (ref 26.0–34.0)
MCHC: 33.4 g/dL (ref 30.0–36.0)
MCV: 97.4 fL (ref 80.0–100.0)
Monocytes Absolute: 0.4 K/uL (ref 0.1–1.0)
Monocytes Relative: 5 %
Neutro Abs: 5.3 K/uL (ref 1.7–7.7)
Neutrophils Relative %: 68 %
Platelets: 110 K/uL — ABNORMAL LOW (ref 150–400)
RBC: 3.13 MIL/uL — ABNORMAL LOW (ref 3.87–5.11)
RDW: 16.1 % — ABNORMAL HIGH (ref 11.5–15.5)
WBC: 7.8 K/uL (ref 4.0–10.5)
nRBC: 0 % (ref 0.0–0.2)

## 2024-02-25 LAB — GLUCOSE, CAPILLARY
Glucose-Capillary: 245 mg/dL — ABNORMAL HIGH (ref 70–99)
Glucose-Capillary: 273 mg/dL — ABNORMAL HIGH (ref 70–99)

## 2024-02-25 LAB — SURGICAL PCR SCREEN
MRSA, PCR: POSITIVE — AB
Staphylococcus aureus: POSITIVE — AB

## 2024-02-25 LAB — LIPASE, BLOOD: Lipase: 19 U/L (ref 11–51)

## 2024-02-25 MED ORDER — POTASSIUM CHLORIDE CRYS ER 20 MEQ PO TBCR
20.0000 meq | EXTENDED_RELEASE_TABLET | Freq: Once | ORAL | Status: AC
  Administered 2024-02-25: 20 meq via ORAL
  Filled 2024-02-25: qty 1

## 2024-02-25 MED ORDER — INSULIN ASPART 100 UNIT/ML IJ SOLN
0.0000 [IU] | Freq: Every day | INTRAMUSCULAR | Status: DC
  Administered 2024-02-25: 3 [IU] via SUBCUTANEOUS
  Administered 2024-02-26: 4 [IU] via SUBCUTANEOUS
  Filled 2024-02-25: qty 3
  Filled 2024-02-25: qty 4

## 2024-02-25 MED ORDER — INSULIN GLARGINE 100 UNIT/ML ~~LOC~~ SOLN
10.0000 [IU] | Freq: Every day | SUBCUTANEOUS | Status: DC
  Administered 2024-02-25 – 2024-02-26 (×2): 10 [IU] via SUBCUTANEOUS
  Filled 2024-02-25 (×3): qty 0.1

## 2024-02-25 MED ORDER — INSULIN ASPART 100 UNIT/ML IJ SOLN
0.0000 [IU] | Freq: Three times a day (TID) | INTRAMUSCULAR | Status: DC
  Administered 2024-02-25: 3 [IU] via SUBCUTANEOUS
  Administered 2024-02-26: 5 [IU] via SUBCUTANEOUS
  Administered 2024-02-26: 3 [IU] via SUBCUTANEOUS
  Administered 2024-02-26: 2 [IU] via SUBCUTANEOUS
  Administered 2024-02-27: 15 [IU] via SUBCUTANEOUS
  Administered 2024-02-27: 11 [IU] via SUBCUTANEOUS
  Filled 2024-02-25: qty 5
  Filled 2024-02-25: qty 15
  Filled 2024-02-25 (×2): qty 3
  Filled 2024-02-25: qty 11
  Filled 2024-02-25: qty 3

## 2024-02-25 MED ORDER — INSULIN GLARGINE-YFGN 100 UNIT/ML ~~LOC~~ SOLN: 10.0000 [IU] | Freq: Every day | SUBCUTANEOUS | Status: DC

## 2024-02-25 NOTE — TOC Initial Note (Signed)
 Transition of Care [facility]) - Initial/Assessment Note    Patient Details  Name: Heather Petty MRN: [id number] Date of Birth: 1951-02-03  Transition of Care [facility]) CM/SW Contact:    Luise JAYSON Pan, LCSWA Phone Number: 02/25/2024, 1:15 PM  Clinical Narrative:   CSW met patient at bedside to discuss which facility patient admitted from. Patient reports she is from [facility] for long term care and is agreeable to discharging back when medically stable. CSW followed up with Darrien, AD at [place], about patient. Per Darrien, patient is from facility and can discharge back when medically stable.   CSW will continue to follow.    Expected Discharge Plan: Skilled Nursing Facility Barriers to Discharge: Continued Medical Work up   Patient Goals and CMS Choice Patient states their goals for this hospitalization and ongoing recovery are:: Return to ltc CMS Medicare.gov Compare Post Acute Care list provided to::  (NA) Choice offered to / list presented to : NA [place] ownership interest in [facility].provided to::  (NA)    Expected Discharge Plan and Services In-house Referral: Clinical Social Work   Post Acute Care Choice: Skilled Nursing Facility Living arrangements for the past 2 months: Skilled Nursing Facility                                      Prior Living Arrangements/Services Living arrangements for the past 2 months: Skilled Nursing Facility Lives with:: Facility Resident Patient language and need for interpreter reviewed:: Yes Do you feel safe going back to the place where you live?: Yes      Need for Family Participation in Patient Care: No (Comment) Care giver support system in place?: Yes (comment)   Criminal Activity/Legal Involvement Pertinent to Current Situation/Hospitalization: No - Comment as needed  Activities of Daily Living   ADL Screening (condition at time of admission) Independently performs ADLs?: No Does the patient  have a NEW difficulty with bathing/dressing/toileting/self-feeding that is expected to last >3 days?: Yes (Initiates electronic notice to provider for possible OT consult) Does the patient have a NEW difficulty with getting in/out of bed, walking, or climbing stairs that is expected to last >3 days?: Yes (Initiates electronic notice to provider for possible PT consult) Does the patient have a NEW difficulty with communication that is expected to last >3 days?: No Is the patient deaf or have difficulty hearing?: No Does the patient have difficulty seeing, even when wearing glasses/contacts?: No Does the patient have difficulty concentrating, remembering, or making decisions?: No  Permission Sought/Granted Permission sought to share information with : Family Supports, Oceanographer granted to share information with : Yes, Verbal Permission Granted  Share Information with NAME: Green,Christina  Daughter, Emergency Contact  [phone]  Permission granted to share info w AGENCY: SNFs        Emotional Assessment Appearance:: Appears stated age Attitude/Demeanor/Rapport: Engaged Affect (typically observed): Pleasant Orientation: : Oriented to Situation, Oriented to  Time, Oriented to Place, Oriented to Self Alcohol  / Substance Use: Not Applicable Psych Involvement: No (comment)  Admission diagnosis:  Acute gallstone pancreatitis [K85.10] Patient Active Problem List   Diagnosis Date Noted   Acute gallstone pancreatitis 02/23/2024   Cholelithiasis 02/23/2024   Chronic transaminitis/hepatic steatosis 02/23/2024   Sacral ulcer (HCC) 02/23/2024   History of asthma 02/23/2024   Skin maceration 05/23/2022   Hyponatremia 05/18/2022   Neutropenic fever 02/23/2022   Port-A-Cath  in place 02/15/2022   Febrile neutropenia 02/11/2022   Hodgkin lymphoma (HCC) 01/10/2022   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   Candidal intertrigo 09/25/2019   CAP (community  acquired pneumonia) 09/03/2019   Uncontrolled type 2 diabetes mellitus with hyperglycemia (HCC) 09/03/2019   Elevated LFTs 09/03/2019   Essential hypertension 09/03/2019   Gout 09/03/2019   Asthma 09/03/2019   Anemia 09/03/2019   Pneumonia 09/03/2019   Splenomegaly    Anemia 08/05/2019   Diarrhea 08/05/2019   DKA (diabetic ketoacidosis) (HCC) 07/22/2019   Rhabdomyolysis 07/22/2019   Acute metabolic encephalopathy 07/22/2019   MRSA bacteremia 07/22/2019   Enterococcal bacteremia 07/22/2019   Vitiligo 07/22/2019   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   Sepsis (HCC) 07/19/2018   Ventricular tachycardia, sustained (HCC) 07/19/2018   NSTEMI (non-ST elevated myocardial infarction) (HCC) 07/19/2018   Wound infection 04/03/2018   Morbid obesity with BMI of 50.0-59.9, adult (HCC) 04/03/2018   Elevated alkaline phosphatase level 04/03/2018   Asthma exacerbation 03/14/2018   Lactic acidosis 03/14/2018   GERD (gastroesophageal reflux disease) 03/14/2018   Acute renal failure superimposed on stage 3 chronic kidney disease (HCC) 03/14/2018   Macrocytic anemia 03/14/2018   Abnormal LFTs 03/14/2018   Acute kidney injury 02/28/2018   Wound of sacral region 02/28/2018   Hypothermia 02/28/2018   Stage II pressure ulcer of sacral region (HCC) 02/15/2018   DM II (diabetes mellitus, type II), controlled (HCC) 02/09/2018   Schizophrenia (HCC) 02/09/2018   Decubitus ulcer of right leg 02/06/2018   Acute renal failure (ARF) 02/05/2018   Hyperlipidemia associated with type 2 diabetes mellitus (HCC) 02/05/2018   Hyperkalemia 02/05/2018   Dermatitis 02/05/2018   Open back wound 02/05/2018   Dysuria 02/05/2018   Paranoid schizophrenia (HCC)    AKI (acute kidney injury) 12/17/2017   Type II diabetes mellitus with renal manifestations (HCC) 12/17/2017   HTN (hypertension) 12/17/2017   Gout    PCP:  System, Provider Not In Pharmacy:   Juliane Karenann GLENWOOD [place],  - [address] [place] [address] [place] [address] [place] [address] Phone: [phone] Fax: [phone]     Social Drivers of Health (SDOH) Social History: SDOH Screenings   Food Insecurity: No Food Insecurity (02/24/2024)  Housing: Low Risk (02/24/2024)  Transportation Needs: No Transportation Needs (02/24/2024)  Utilities: Not At Risk (02/24/2024)  Tobacco Use: Low Risk (02/25/2024)   SDOH Interventions:     Readmission Risk Interventions     No data to display

## 2024-02-25 NOTE — Consult Note (Signed)
 "    Jadynn Henri [birth year]  [id number].    Requesting MD: Dr. Casimer Dare Chief Complaint/Reason for Consult: gallstone pancreatitis  HPI:  This is a 74 yo female with a history of DM, Hodgkin's lymphoma stage III (completed chemo), hepatic steatosis, asthma, obesity, schizophrenia, HLD, OSA, depression, HTN, CVA, and GERD who states around Christmas she developed epigastric abdominal pain that radiated to her back.  She had nausea and vomiting.  This lasted for over a week, but ultimately went away.  This returned again 4 days ago.  She lives at [facility] and mobilizes with a wheelchair only due to bad knees.  She did not have N/V this time along with fevers or diarrhea.  She was brought to the ED where she was found to have elevated LFTs with a lipase greater than 2800.  She then underwent a MRCP which did not show CBD stone, but edema c/w pancreatitis.  Her lipase has normalized and her LFTs are overall down trending; however, she continues to have some epigastric pain.  We have been asked to see her for lap chole.  ROS: ROS: see HPI  Family History  Adopted: Yes  Problem Relation Age of Onset   COPD Daughter     Past Medical History:  Diagnosis Date   AKI (acute kidney injury) 12/17/2017   Allergic rhinitis    Anemia    Arthritis    Asthma    Autonomic neuropathy    Depression    Diabetes mellitus    Diabetes mellitus without complication (HCC)    E coli infection    GERD (gastroesophageal reflux disease)    GERD (gastroesophageal reflux disease)    Gout    Hepatitis A    High cholesterol    Hypertension    Insomnia    Kidney failure    Metabolic encephalopathy    Morbid obesity (HCC)    NASH (nonalcoholic steatohepatitis)    Pneumonia 12/17/2017   Schizophrenia (HCC)    Stroke (HCC)    Vitamin B deficiency    Vitamin D  deficiency     Past Surgical History:  Procedure Laterality Date   CYST EXCISION     buttucks   DENTAL SURGERY     IR IMAGING  GUIDED PORT INSERTION  01/10/2022   IR IMAGING GUIDED PORT INSERTION  04/21/2022   IR REMOVAL TUN ACCESS W/ PORT W/O FL MOD SED  02/28/2022   IR REMOVAL TUN ACCESS W/ PORT W/O FL MOD SED  02/02/2023   TONSILLECTOMY      Social History:  reports that she has never smoked. She has never used smokeless tobacco. She reports that she does not drink alcohol  and does not use drugs.  Allergies: Allergies[1]  Medications Prior to Admission  Medication Sig Dispense Refill   acetaminophen  (TYLENOL ) 500 MG tablet Take 1,000 mg by mouth in the morning and at bedtime.     albuterol  (VENTOLIN  HFA) 108 (90 Base) MCG/ACT inhaler Inhale 2 puffs into the lungs every 4 (four) hours as needed for wheezing or shortness of breath.     allopurinol  (ZYLOPRIM ) 100 MG tablet Take 100 mg by mouth daily.     amLODipine  (NORVASC ) 10 MG tablet Take 10 mg by mouth daily.     ARIPiprazole (ABILIFY) 5 MG tablet Take 5 mg by mouth daily.     ASPERCREME LIDOCAINE  4 % Place 1 patch onto the skin See admin instructions. Apply 1 patch to both knees once a day- remove as directed  carvedilol  (COREG ) 6.25 MG tablet Take 6.25 mg by mouth 2 (two) times daily with a meal.     cholecalciferol  (VITAMIN D3) 25 MCG (1000 UNIT) tablet Take 2,000 Units by mouth daily.     doxepin  (SINEQUAN ) 25 MG capsule Take 25 mg by mouth at bedtime.     Emollient (EUCERIN BABY EX) Apply 1 application  topically See admin instructions. Apply to affected areas daily where the skin is peeling     famotidine  (PEPCID ) 20 MG tablet Take 20 mg by mouth every 12 (twelve) hours.     fluticasone  (FLONASE ) 50 MCG/ACT nasal spray Place 1 spray into both nostrils daily.     glipiZIDE (GLUCOTROL XL) 10 MG 24 hr tablet Take 10 mg by mouth in the morning and at bedtime.     hydrOXYzine  (ATARAX /VISTARIL ) 25 MG tablet Take 25 mg by mouth in the morning and at bedtime.     insulin  aspart (NOVOLOG ) 100 UNIT/ML injection Inject 0-12 Units into the skin See admin  instructions. Inject 0-12 units into the skin 4 times a day (before meals and at bedtime) per sliding scale: BGL <60 or >400  = CALL MD; 60-200 = 0 units; 201-250 = 2 units; 251-300 = 4 units; 301-350 = 6 units; 351-400 = 8 units; 401-450 = 10 units; >450 = 12 units     LANTUS  SOLOSTAR 100 UNIT/ML Solostar Pen Inject 24-38 Units into the skin See admin instructions. Inject 38 units into the skin in the afternoon and 24 units at night     lidocaine  4 % Place 2 patches onto the skin daily. Place 1 patch on each shoulder     lidocaine -prilocaine  (EMLA ) cream Apply 1 Application topically as needed. (Patient taking differently: Apply 1 Application topically as needed. Apply to port site 1-2 hours prior to chemo- cover with saran wrap and secure with tape) 30 g 0   lisinopril  (ZESTRIL ) 5 MG tablet Take 5 mg by mouth daily.     loperamide  (IMODIUM ) 2 MG capsule Take 1 capsule (2 mg total) by mouth as needed for diarrhea or loose stools. 30 capsule 0   loratadine  (CLARITIN ) 10 MG tablet Take 10 mg by mouth daily.     mineral oil-hydrophilic petrolatum (AQUAPHOR) ointment Apply 1 Application topically in the morning and at bedtime.     Simethicone  (GAS-X ULTRA STRENGTH) 180 MG CAPS Take 1 capsule by mouth every 8 (eight) hours as needed (gas).     sodium chloride  (OCEAN) 0.65 % SOLN nasal spray Place 2 sprays into both nostrils every 2 (two) hours as needed for congestion.     SYSTANE BALANCE 0.6 % SOLN Place 2 drops into both eyes every 12 (twelve) hours.     SEMGLEE , YFGN, 100 UNIT/ML injection Inject 24 Units into the skin at bedtime. (Patient not taking: Reported on 02/23/2024)       Physical Exam: Blood pressure (!) 137/56, pulse 78, temperature 98.4 F (36.9 C), temperature source Oral, resp. rate 19, height 5' 2 (1.575 m), weight 102.1 kg, SpO2 91%. General: pleasant, obese female who is laying in bed in NAD HEENT: head is normocephalic, atraumatic.  Sclera are noninjected.  PERRL.  Ears and nose  without any masses or lesions.  Mouth is pink and moist Heart: regular, rate, and rhythm.  Normal s1,s2. No obvious murmurs, gallops, or rubs noted.  Lungs: CTAB, no wheezes, rhonchi, or rales noted.  Respiratory effort nonlabored Abd: soft, tender in epigastrium and RUQ, but greatest in epigastrium.  Voluntary guards a bit on exam in epigastrium, ND, +BS, no masses, hernias, or organomegaly Psych: A&Ox3 with an appropriate affect.   Results for orders placed or performed during the hospital encounter of 02/22/24 (from the past 48 hours)  CBG monitoring, ED     Status: Abnormal   Collection Time: 02/23/24  3:55 PM  Result Value Ref Range   Glucose-Capillary 181 (H) 70 - 99 mg/dL    Comment: Glucose reference range applies only to samples taken after fasting for at least 8 hours.  CBG monitoring, ED     Status: Abnormal   Collection Time: 02/23/24 11:44 PM  Result Value Ref Range   Glucose-Capillary 114 (H) 70 - 99 mg/dL    Comment: Glucose reference range applies only to samples taken after fasting for at least 8 hours.  Blood culture (routine x 2)     Status: None (Preliminary result)   Collection Time: 02/24/24  2:25 AM   Specimen: BLOOD RIGHT ARM  Result Value Ref Range   Specimen Description BLOOD RIGHT ARM    Special Requests      BOTTLES DRAWN AEROBIC AND ANAEROBIC Blood Culture adequate volume PATIENT ON FOLLOWING PIPERACILLIN ,TAZOBACTAM   Culture      NO GROWTH 1 DAY Performed at [facility], [address] [place], [place] [address]    Report Status PENDING   Lipase, blood     Status: Abnormal   Collection Time: 02/24/24  2:29 AM  Result Value Ref Range   Lipase 63 (H) 11 - 51 U/L    Comment: Repeated to verify  Hemolysis at this level may affect result  Performed at [facility], [address] [place], [place] [address]   CBC with Differential/Platelet     Status: Abnormal   Collection Time: 02/24/24  2:29 AM  Result Value Ref Range   WBC 9.4 4.0 -  10.5 K/uL   RBC 3.30 (L) 3.87 - 5.11 MIL/uL   Hemoglobin 10.6 (L) 12.0 - 15.0 g/dL   HCT 67.4 (L) 63.9 - 53.9 %   MCV 98.5 80.0 - 100.0 fL   MCH 32.1 26.0 - 34.0 pg   MCHC 32.6 30.0 - 36.0 g/dL   RDW 83.2 (H) 88.4 - 84.4 %   Platelets  150 - 400 K/uL    PLATELET CLUMPS NOTED ON SMEAR, COUNT APPEARS DECREASED   nRBC 0.3 (H) 0.0 - 0.2 %   Neutrophils Relative % 72 %   Neutro Abs 6.9 1.7 - 7.7 K/uL   Lymphocytes Relative 22 %   Lymphs Abs 2.0 0.7 - 4.0 K/uL   Monocytes Relative 3 %   Monocytes Absolute 0.3 0.1 - 1.0 K/uL   Eosinophils Relative 2 %   Eosinophils Absolute 0.2 0.0 - 0.5 K/uL   Basophils Relative 0 %   Basophils Absolute 0.0 0.0 - 0.1 K/uL   WBC Morphology MORPHOLOGY UNREMARKABLE    RBC Morphology See Note    Smear Review Normal platelet morphology    Immature Granulocytes 1 %   Abs Immature Granulocytes 0.05 0.00 - 0.07 K/uL   Polychromasia PRESENT     Comment: Performed at [facility], [address] [place], [place] [address]  Hepatic function panel     Status: Abnormal   Collection Time: 02/24/24  2:29 AM  Result Value Ref Range   Total Protein 5.5 (L) 6.5 - 8.1 g/dL   Albumin 3.0 (L) 3.5 - 5.0 g/dL   AST 75 (H)  15 - 41 U/L    Comment: Hemolysis at this level may affect result    ALT 120 (H) 0 - 44 U/L    Comment: Hemolysis at this level may affect result    Alkaline Phosphatase 157 (H) 38 - 126 U/L   Total Bilirubin 0.7 0.0 - 1.2 mg/dL   Bilirubin, Direct 0.1 0.0 - 0.2 mg/dL    Comment: Hemolysis at this level may affect result    Indirect Bilirubin 0.6 0.3 - 0.9 mg/dL    Comment: Performed at [facility], [address] [place], [place] [address]  Basic metabolic panel     Status: Abnormal   Collection Time: 02/24/24  2:29 AM  Result Value Ref Range   Sodium 137 135 - 145 mmol/L   Potassium 3.8 3.5 - 5.1 mmol/L    Comment: HEMOLYSIS AT THIS LEVEL MAY AFFECT RESULT   Chloride 102 98 - 111 mmol/L   CO2 24 22 - 32 mmol/L   Glucose, Bld  111 (H) 70 - 99 mg/dL    Comment: Glucose reference range applies only to samples taken after fasting for at least 8 hours.   BUN 12 8 - 23 mg/dL   Creatinine, Ser 9.16 0.44 - 1.00 mg/dL   Calcium  8.3 (L) 8.9 - 10.3 mg/dL   GFR, Estimated >39 >39 mL/min    Comment: (NOTE) Calculated using the CKD-EPI Creatinine Equation (2021)    Anion gap 11 5 - 15    Comment: Performed at [facility], [address], [place], [place] [address]  MRSA Next Gen by PCR, Nasal     Status: Abnormal   Collection Time: 02/24/24  5:10 PM   Specimen: Nasal Mucosa; Nasal Swab  Result Value Ref Range   MRSA by PCR Next Gen DETECTED (A) NOT DETECTED    Comment: RESULT CALLED TO, READ BACK BY AND VERIFIED WITH: RN TYRONNE BATTLE [phone] AT 1943 ADC (NOTE) The GeneXpert MRSA Assay (FDA approved for NASAL specimens only), is one component of a comprehensive MRSA colonization surveillance program. It is not intended to diagnose MRSA infection nor to guide or monitor treatment for MRSA infections. Test performance is not FDA approved in patients less than 71 years old. Performed at [facility], [address] [place], [place] [address]   Lipase, blood     Status: None   Collection Time: 02/25/24  2:48 AM  Result Value Ref Range   Lipase 19 11 - 51 U/L    Comment: Performed at [facility], [address] [place], [place] [address]  CBC with Differential/Platelet     Status: Abnormal   Collection Time: 02/25/24  2:48 AM  Result Value Ref Range   WBC 7.8 4.0 - 10.5 K/uL   RBC 3.13 (L) 3.87 - 5.11 MIL/uL   Hemoglobin 10.2 (L) 12.0 - 15.0 g/dL   HCT 69.4 (L) 63.9 - 53.9 %   MCV 97.4 80.0 - 100.0 fL   MCH 32.6 26.0 - 34.0 pg   MCHC 33.4 30.0 - 36.0 g/dL   RDW 83.8 (H) 88.4 - 84.4 %   Platelets 110 (L) 150 - 400 K/uL   nRBC 0.0 0.0 - 0.2 %   Neutrophils Relative % 68 %   Neutro Abs 5.3 1.7 - 7.7 K/uL   Lymphocytes Relative 25 %   Lymphs Abs 1.9 0.7 - 4.0 K/uL   Monocytes Relative 5 %    Monocytes Absolute 0.4 0.1 - 1.0  K/uL   Eosinophils Relative 2 %   Eosinophils Absolute 0.2 0.0 - 0.5 K/uL   Basophils Relative 0 %   Basophils Absolute 0.0 0.0 - 0.1 K/uL   Immature Granulocytes 0 %   Abs Immature Granulocytes 0.03 0.00 - 0.07 K/uL    Comment: Performed at [facility], [address] [place], [place] [address]  Comprehensive metabolic panel with GFR     Status: Abnormal   Collection Time: 02/25/24  2:48 AM  Result Value Ref Range   Sodium 136 135 - 145 mmol/L   Potassium 3.4 (L) 3.5 - 5.1 mmol/L   Chloride 100 98 - 111 mmol/L   CO2 26 22 - 32 mmol/L   Glucose, Bld 233 (H) 70 - 99 mg/dL    Comment: Glucose reference range applies only to samples taken after fasting for at least 8 hours.   BUN 8 8 - 23 mg/dL   Creatinine, Ser 9.33 0.44 - 1.00 mg/dL   Calcium  8.6 (L) 8.9 - 10.3 mg/dL   Total Protein 5.9 (L) 6.5 - 8.1 g/dL   Albumin 3.2 (L) 3.5 - 5.0 g/dL   AST 28 15 - 41 U/L   ALT 82 (H) 0 - 44 U/L   Alkaline Phosphatase 161 (H) 38 - 126 U/L   Total Bilirubin 0.6 0.0 - 1.2 mg/dL   GFR, Estimated >39 >39 mL/min    Comment: (NOTE) Calculated using the CKD-EPI Creatinine Equation (2021)    Anion gap 10 5 - 15    Comment: Performed at [facility], [address], [place], [place] [address]   No results found.    Assessment/Plan Gallstone pancreatitis The patient has been seen, examined, labs, vitals, chart, and imaging personally reviewed.  She appears to likely being having her second episode of gallstone pancreatitis.  We discussed the need for lap chole to minimize the risk of any further episodes of this.  However, she is still fairly tender on exam today and says her pain isn't really much better than on admission.  CLD is ok, but NPO p MN.  I suspect she may be ready for OR tomorrow.  We discussed this and the reasoning behind it.  She is agreeable with this plan.  We will continue to follow   FEN - CLD, NPO p MN/IVFs VTE - Lovenox  ID -  Zosyn   DM HTN GERD H/O CVA Deconditioned with WC dependence Hx of hep A Hodgkin's lymphoma, stage III, chemo completed Schizophrenia  I reviewed Consultant GI notes, hospitalist notes, last 24 h vitals and pain scores, last 48 h intake and output, last 24 h labs and trends, and last 24 h imaging results.  Burnard FORBES Banter, PA-C [facility] 02/25/2024, 8:15 AM Please see Amion for pager number during day hours 7:00am-4:30pm or 7:00am -11:30am on weekends      [1]  Allergies Allergen Reactions   Vancomycin  Itching and Other (See Comments)    Patient becomes hypotensive, lethargic and itchy. Has tolerated it many times. **not listed on the MAR**   Emend [Aprepitant] Other (See Comments)    Pt experienced shooting back pain at 10/10 when medication was administered the first time. See Hypersensitivity note from 01/17/2022. **not listed on the [facility]**   Fosaprepitant  Other (See Comments)    Back pain / hypersensitivity 01/17/2022 **not listed on the MAR**   Lasix  [Furosemide ] Other (See Comments)    IV-LASIX  ==> Reaction: Burning   Penicillins     Unknown  reaction per MAR   Pineapple Rash   "

## 2024-02-25 NOTE — Progress Notes (Signed)
 " PROGRESS NOTE    Heather Petty  FMW:[id number] DOB: 11/12/1950 DOA: 02/22/2024 PCP: System, Provider Not In  Subjective: Patient reports abdominal pain but improved from before. Pain meds are helping   Hospital Course: 74 year old female with PMH of Hodgkin lymphoma (s/p completed chemotherapy), sacral ulcer, chronic transaminitis, hepatic steatosis, asthma, DM 2, morbid obesity, schizophrenia, HLD, HTN, prior CVA who presented to the ED with abdominal pain associate with nausea and vomiting.  CT abdomen/pelvis showed questionable subtle peripancreatic haziness, possible pancreatitis.  She had elevated lipase levels of 2800.  She was managed for acute pancreatitis. MRCP was done that showed acute pancreatitis with mild edema, cholelithiasis, with mild dilatation of the CBD and no choledocholithiasis.  GI was consulted, surgery was consulted and plan for a lap chole   Assessment and Plan:  Acute gallstone pancreatitis Transaminitis History of abnormal liver function/hepatic steatosis Concern for early development of acute cholangitis - CT abdomen pelvis showed subtle peripancreatic haziness, correlate with laboratory values for possible pancreatitis. Distended gallbladder with small stones. Diverticular disease of the colon without acute wall thickening. Right upper quadrant ultrasound showed cholelithiasis without evidence of acute cholecystitis.  Normal biliary ductal diameter and no intrahepatic ductal dilation. MRCP demonstrated Acute pancreatitis with mild edema, most likely due to gallstone pancreatitis. Cholelithiasis with mild fusiform dilatation of the common bile duct to 9 mm and no choledocholithiasis. Seen by GI as well  - lipase was elevated to 2800 - continue pain control with current management - continue IV zosyn  for now  - general surgery was consulted, tentative plan for cholecystectomy tomorrow   History of Hodgkin lymphoma -History of Hodgkin lymphoma stage III status  post chemotherapy and currently in remission follows oncology.  Elevated lactic acid 2.3 -Resolved to 1.5 with IV fluids. Patient did not meet sepsis criteria.   Essential hypertension -Continue Coreg  6.25 mg twice daily.   Insulin -dependent DM type II - did not see any insulin  orders - started on 10U lantus  and SSI for now. Monitor and adjust doses as appropriate    History of schizophrenia -Continue doxepin  and Atarax    History of sacral ulcer -Continue turn patient every 2 hours and air mattress   History of asthma -Stable.  Continue albuterol  as needed     DVT prophylaxis: SCDs Start: 02/23/24 0256 Place TED hose Start: 02/23/24 0256    Code Status: Full Code Disposition Plan: TBD Reason for continuing need for hospitalization: Surgery tomorrow   Objective: Vitals:   02/24/24 2348 02/25/24 0332 02/25/24 0739 02/25/24 1125  BP: (!) 145/50 116/69 (!) 137/56 (!) 130/50  Pulse: 76 77 78 76  Resp: 19 19 19 19   Temp: 98.4 F (36.9 C) 98.4 F (36.9 C) 98.4 F (36.9 C) 98.4 F (36.9 C)  TempSrc: Oral Oral Oral Oral  SpO2: 94% 91% 91% 91%  Weight:      Height:        Intake/Output Summary (Last 24 hours) at 02/25/2024 1409 Last data filed at 02/25/2024 0500 Gross per 24 hour  Intake 277.04 ml  Output 900 ml  Net -622.96 ml   Filed Weights   02/22/24 1953  Weight: 102.1 kg    Examination:  Physical Exam Vitals and nursing note reviewed.  Constitutional:      General: She is not in acute distress.    Appearance: She is obese.  Cardiovascular:     Rate and Rhythm: Normal rate.  Pulmonary:     Effort: No respiratory distress.     Breath  sounds: No wheezing.  Abdominal:     General: There is no distension.     Tenderness: There is abdominal tenderness.  Musculoskeletal:     Right lower leg: No edema.     Left lower leg: No edema.     Data Reviewed: I have personally reviewed following labs and imaging studies  CBC: Recent Labs  Lab  02/22/24 2123 02/22/24 2126 02/23/24 0428 02/24/24 0229 02/25/24 0248  WBC  --  9.5 8.7 9.4 7.8  NEUTROABS  --   --   --  6.9 5.3  HGB 15.0 13.6 12.0 10.6* 10.2*  HCT 44.0 40.4 36.2 32.5* 30.5*  MCV  --  96.0 96.5 98.5 97.4  PLT  --  153 141* PLATELET CLUMPS NOTED ON SMEAR, COUNT APPEARS DECREASED 110*   Basic Metabolic Panel: Recent Labs  Lab 02/22/24 2022 02/22/24 2123 02/23/24 0428 02/24/24 0229 02/25/24 0248  NA 138 140 140 137 136  K 4.2 4.0 3.3* 3.8 3.4*  CL 102 100 102 102 100  CO2 22  --  27 24 26   GLUCOSE 220* 225* 127* 111* 233*  BUN 13 15 12 12 8   CREATININE 0.71 0.60 0.70 0.83 0.66  CALCIUM  9.7  --  9.3 8.3* 8.6*   GFR: Estimated Creatinine Clearance: 68 mL/min (by C-G formula based on SCr of 0.66 mg/dL). Liver Function Tests: Recent Labs  Lab 02/22/24 2022 02/23/24 0428 02/24/24 0229 02/25/24 0248  AST 321* 238* 75* 28  ALT 209* 235* 120* 82*  ALKPHOS 240* 269* 157* 161*  BILITOT 1.9* 0.9 0.7 0.6  PROT 7.3 6.5 5.5* 5.9*  ALBUMIN 3.9 3.8 3.0* 3.2*   Recent Labs  Lab 02/22/24 2022 02/24/24 0229 02/25/24 0248  LIPASE >2,800* 63* 19   No results for input(s): AMMONIA in the last 168 hours. Coagulation Profile: No results for input(s): INR, PROTIME in the last 168 hours. Cardiac Enzymes: No results for input(s): CKTOTAL, CKMB, CKMBINDEX, TROPONINI in the last 168 hours. ProBNP, BNP (last 5 results) No results for input(s): PROBNP, BNP in the last 8760 hours. HbA1C: No results for input(s): HGBA1C in the last 72 hours. CBG: Recent Labs  Lab 02/23/24 0451 02/23/24 0800 02/23/24 1555 02/23/24 2344  GLUCAP 100* 84 181* 114*   Lipid Profile: Recent Labs    02/23/24 0428  TRIG 153*   Thyroid  Function Tests: No results for input(s): TSH, T4TOTAL, FREET4, T3FREE, THYROIDAB in the last 72 hours. Anemia Panel: No results for input(s): VITAMINB12, FOLATE, FERRITIN, TIBC, IRON, RETICCTPCT in the last  72 hours. Sepsis Labs: Recent Labs  Lab 02/23/24 0115 02/23/24 0252  LATICACIDVEN 2.3* 1.5    Recent Results (from the past 240 hours)  Resp panel by RT-PCR (RSV, Flu A&B, Covid) Anterior Nasal Swab     Status: None   Collection Time: 02/22/24  8:14 PM   Specimen: Anterior Nasal Swab  Result Value Ref Range Status   SARS Coronavirus 2 by RT PCR NEGATIVE NEGATIVE Final   Influenza A by PCR NEGATIVE NEGATIVE Final   Influenza B by PCR NEGATIVE NEGATIVE Final    Comment: (NOTE) The Xpert Xpress SARS-CoV-2/FLU/RSV plus assay is intended as an aid in the diagnosis of influenza from Nasopharyngeal swab specimens and should not be used as a sole basis for treatment. Nasal washings and aspirates are unacceptable for Xpert Xpress SARS-CoV-2/FLU/RSV testing.  Fact Sheet for Patients: bloggercourse.com  Fact Sheet for Healthcare Providers: seriousbroker.it  This test is not yet approved or cleared by the [place]  [place] FDA and has been authorized for detection and/or diagnosis of SARS-CoV-2 by FDA under an Emergency Use Authorization (EUA). This EUA will remain in effect (meaning this test can be used) for the duration of the COVID-19 declaration under Section 564(b)(1) of the Act, 21 U.S.C. section 360bbb-3(b)(1), unless the authorization is terminated or revoked.     Resp Syncytial Virus by PCR NEGATIVE NEGATIVE Final    Comment: (NOTE) Fact Sheet for Patients: bloggercourse.com  Fact Sheet for Healthcare Providers: seriousbroker.it  This test is not yet approved or cleared by the [place]  FDA and has been authorized for detection and/or diagnosis of SARS-CoV-2 by FDA under an Emergency Use Authorization (EUA). This EUA will remain in effect (meaning this test can be used) for the duration of the COVID-19 declaration under Section 564(b)(1) of the Act, 21 U.S.C. section  360bbb-3(b)(1), unless the authorization is terminated or revoked.  Performed at [facility], [address] [place], [place] [address]   Blood culture (routine x 2)     Status: None (Preliminary result)   Collection Time: 02/23/24  2:02 AM   Specimen: BLOOD LEFT ARM  Result Value Ref Range Status   Specimen Description BLOOD LEFT ARM  Final   Special Requests   Final    BOTTLES DRAWN AEROBIC AND ANAEROBIC Blood Culture adequate volume   Culture   Final    NO GROWTH 2 DAYS Performed at [facility], [address], [place], [place] [address]    Report Status PENDING  Incomplete  Blood culture (routine x 2)     Status: None (Preliminary result)   Collection Time: 02/24/24  2:25 AM   Specimen: BLOOD RIGHT ARM  Result Value Ref Range Status   Specimen Description BLOOD RIGHT ARM  Final   Special Requests   Final    BOTTLES DRAWN AEROBIC AND ANAEROBIC Blood Culture adequate volume PATIENT ON FOLLOWING PIPERACILLIN ,TAZOBACTAM   Culture   Final    NO GROWTH 1 DAY Performed at [facility], [address] [place], [place] [address]    Report Status PENDING  Incomplete  MRSA Next Gen by PCR, Nasal     Status: Abnormal   Collection Time: 02/24/24  5:10 PM   Specimen: Nasal Mucosa; Nasal Swab  Result Value Ref Range Status   MRSA by PCR Next Gen DETECTED (A) NOT DETECTED Final    Comment: RESULT CALLED TO, READ BACK BY AND VERIFIED WITH: RN TYRONNE BATTLE [phone] AT 1943 ADC (NOTE) The GeneXpert MRSA Assay (FDA approved for NASAL specimens only), is one component of a comprehensive MRSA colonization surveillance program. It is not intended to diagnose MRSA infection nor to guide or monitor treatment for MRSA infections. Test performance is not FDA approved in patients less than 68 years old. Performed at [facility], [address], [place], [place] [address]      Radiology Studies: No results found.  Scheduled Meds:  carvedilol   6.25 mg Oral BID WC    Chlorhexidine  Gluconate Cloth  6 each Topical Daily   doxepin   25 mg Oral QHS   enoxaparin  (LOVENOX ) injection  50 mg Subcutaneous Q24H   hydrOXYzine   25 mg Oral BID   insulin  aspart  0-15 Units Subcutaneous TID WC   insulin  aspart  0-5 Units Subcutaneous QHS   insulin  glargine-yfgn  10 Units Subcutaneous QHS   mupirocin  ointment  1 Application Nasal BID   pantoprazole  (PROTONIX ) IV  40 mg Intravenous Q24H  sodium chloride  flush  3 mL Intravenous Q12H   Continuous Infusions:  piperacillin -tazobactam (ZOSYN )  IV 3.375 g (02/25/24 0806)     LOS: 2 days   Time spent: 40 minutes  Casimer Dare, MD  [facility]  02/25/2024, 2:09 PM   "

## 2024-02-25 NOTE — [place] FL2 (Signed)
 "   MEDICAID FL2 LEVEL OF CARE FORM     IDENTIFICATION  Patient Name: Heather Petty Birthdate: 25-Nov-1950 Sex: female Admission Date (Current Location): 02/22/2024  [place] and [place] Number:  [place] [id number] P Facility and Address:  The [facility], [address] [place], [place] [address]      Provider Number: [id number]  Attending Physician Name and Address:  Caleen Colander, MD  Relative Name and Phone Number:  Green,Christina  Daughter, Emergency Contact  [phone] (Mobile)    Current Level of Care: Hospital Recommended Level of Care: Skilled Nursing Facility Prior Approval Number:    Date Approved/Denied:   PASRR Number: [id number]  Discharge Plan: SNF    Current Diagnoses: Patient Active Problem List   Diagnosis Date Noted   Acute gallstone pancreatitis 02/23/2024   Cholelithiasis 02/23/2024   Chronic transaminitis/hepatic steatosis 02/23/2024   Sacral ulcer (HCC) 02/23/2024   History of asthma 02/23/2024   Skin maceration 05/23/2022   Hyponatremia 05/18/2022   Neutropenic fever 02/23/2022   Port-A-Cath in place 02/15/2022   Febrile neutropenia 02/11/2022   Hodgkin lymphoma (HCC) 01/10/2022   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   Candidal intertrigo 09/25/2019   CAP (community acquired pneumonia) 09/03/2019   Uncontrolled type 2 diabetes mellitus with hyperglycemia (HCC) 09/03/2019   Elevated LFTs 09/03/2019   Essential hypertension 09/03/2019   Gout 09/03/2019   Asthma 09/03/2019   Anemia 09/03/2019   Pneumonia 09/03/2019   Splenomegaly    Anemia 08/05/2019   Diarrhea 08/05/2019   DKA (diabetic ketoacidosis) (HCC) 07/22/2019   Rhabdomyolysis 07/22/2019   Acute metabolic encephalopathy 07/22/2019   MRSA bacteremia 07/22/2019   Enterococcal bacteremia 07/22/2019   Vitiligo 07/22/2019   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   Sepsis (HCC) 07/19/2018   Ventricular tachycardia, sustained (HCC) 07/19/2018   NSTEMI  (non-ST elevated myocardial infarction) (HCC) 07/19/2018   Wound infection 04/03/2018   Morbid obesity with BMI of 50.0-59.9, adult (HCC) 04/03/2018   Elevated alkaline phosphatase level 04/03/2018   Asthma exacerbation 03/14/2018   Lactic acidosis 03/14/2018   GERD (gastroesophageal reflux disease) 03/14/2018   Acute renal failure superimposed on stage 3 chronic kidney disease (HCC) 03/14/2018   Macrocytic anemia 03/14/2018   Abnormal LFTs 03/14/2018   Acute kidney injury 02/28/2018   Wound of sacral region 02/28/2018   Hypothermia 02/28/2018   Stage II pressure ulcer of sacral region (HCC) 02/15/2018   DM II (diabetes mellitus, type II), controlled (HCC) 02/09/2018   Schizophrenia (HCC) 02/09/2018   Decubitus ulcer of right leg 02/06/2018   Acute renal failure (ARF) 02/05/2018   Hyperlipidemia associated with type 2 diabetes mellitus (HCC) 02/05/2018   Hyperkalemia 02/05/2018   Dermatitis 02/05/2018   Open back wound 02/05/2018   Dysuria 02/05/2018   Paranoid schizophrenia (HCC)    AKI (acute kidney injury) 12/17/2017   Type II diabetes mellitus with renal manifestations (HCC) 12/17/2017   HTN (hypertension) 12/17/2017   Gout     Orientation RESPIRATION BLADDER Height & Weight     Self, Time, Situation, Place  Normal Continent, External catheter Weight: 225 lb (102.1 kg) Height:  5' 2 (157.5 cm)  BEHAVIORAL SYMPTOMS/MOOD NEUROLOGICAL BOWEL NUTRITION STATUS      Continent Diet (Please see discharge summary)  AMBULATORY STATUS COMMUNICATION OF NEEDS Skin     Verbally PU Stage and Appropriate Care (Pressure Injury Buttocks Right Stage 2)                       Personal Care Assistance  Level of Assistance  Bathing, Dressing, Feeding Bathing Assistance:  (Please see discharge summary) Feeding assistance:  (Please see discharge summary) Dressing Assistance:  (Please see discharge summary)     Functional Limitations Info  Sight, Speech, Hearing Sight Info:  Adequate Hearing Info: Adequate Speech Info: Adequate    SPECIAL CARE FACTORS FREQUENCY                       Contractures Contractures Info: Not present    Additional Factors Info  Code Status, Allergies Code Status Info: Full Allergies Info: Vancomycin , Emend (Aprepitant), Fosaprepitant , Lasix  (Furosemide ), Penicillins, Pineapple           Current Medications (02/25/2024):  This is the current hospital active medication list Current Facility-Administered Medications  Medication Dose Route Frequency Provider Last Rate Last Admin   albuterol  (PROVENTIL ) (2.5 MG/3ML) 0.083% nebulizer solution 2.5 mg  2.5 mg Nebulization Q4H PRN Sundil, Subrina, MD       carvedilol  (COREG ) tablet 6.25 mg  6.25 mg Oral BID WC Sundil, Subrina, MD   6.25 mg at 02/25/24 0801   Chlorhexidine  Gluconate Cloth 2 % PADS 6 each  6 each Topical Daily Swayze, Ava, DO   6 each at 02/25/24 0810   doxepin  (SINEQUAN ) capsule 25 mg  25 mg Oral QHS Sundil, Subrina, MD   25 mg at 02/24/24 2100   enoxaparin  (LOVENOX ) injection 50 mg  50 mg Subcutaneous Q24H Sundil, Subrina, MD   50 mg at 02/25/24 0801   hydrOXYzine  (ATARAX ) tablet 25 mg  25 mg Oral BID Sundil, Subrina, MD   25 mg at 02/25/24 0801   morphine  (PF) 2 MG/ML injection 1-2 mg  1-2 mg Intravenous Q3H PRN Sundil, Subrina, MD   2 mg at 02/24/24 1909   mupirocin  ointment (BACTROBAN ) 2 % 1 Application  1 Application Nasal BID Swayze, Ava, DO   1 Application at 02/25/24 0802   ondansetron  (ZOFRAN ) tablet 4 mg  4 mg Oral Q6H PRN Sundil, Subrina, MD       Or   ondansetron  (ZOFRAN ) injection 4 mg  4 mg Intravenous Q6H PRN Sundil, Subrina, MD   4 mg at 02/24/24 1909   pantoprazole  (PROTONIX ) injection 40 mg  40 mg Intravenous Q24H Esterwood, Amy S, PA-C   40 mg at 02/25/24 1033   piperacillin -tazobactam (ZOSYN ) IVPB 3.375 g  3.375 g Intravenous Q8H Sundil, Subrina, MD 12.5 mL/hr at 02/25/24 0806 3.375 g at 02/25/24 9193   sodium chloride  flush (NS) 0.9 %  injection 3 mL  3 mL Intravenous Q12H Sundil, Subrina, MD   3 mL at 02/23/24 0947   sodium chloride  flush (NS) 0.9 % injection 3 mL  3 mL Intravenous PRN Sundil, Subrina, MD       Facility-Administered Medications Ordered in Other Encounters  Medication Dose Route Frequency Provider Last Rate Last Admin   0.9 %  sodium chloride  infusion   Intravenous Continuous Allred, Darrell K, PA-C         Discharge Medications: Please see discharge summary for a list of discharge medications.  Relevant Imaging Results:  Relevant Lab Results:   Additional Information SS# [id number]  Luise JAYSON Pan, LCSWA     "

## 2024-02-25 NOTE — Progress Notes (Signed)
 Air mattress in place.

## 2024-02-26 ENCOUNTER — Other Ambulatory Visit: Payer: Self-pay

## 2024-02-26 ENCOUNTER — Inpatient Hospital Stay (HOSPITAL_COMMUNITY): Admitting: Anesthesiology

## 2024-02-26 ENCOUNTER — Encounter (HOSPITAL_COMMUNITY): Admission: EM | Disposition: A | Discharge status: Skilled Nursing Facility | Payer: Self-pay | Source: Home / Self Care

## 2024-02-26 ENCOUNTER — Encounter (HOSPITAL_COMMUNITY): Payer: Self-pay | Admitting: Internal Medicine

## 2024-02-26 DIAGNOSIS — J45909 Unspecified asthma, uncomplicated: Secondary | ICD-10-CM

## 2024-02-26 DIAGNOSIS — K810 Acute cholecystitis: Secondary | ICD-10-CM

## 2024-02-26 DIAGNOSIS — I10 Essential (primary) hypertension: Secondary | ICD-10-CM

## 2024-02-26 DIAGNOSIS — Z87891 Personal history of nicotine dependence: Secondary | ICD-10-CM | POA: Diagnosis not present

## 2024-02-26 DIAGNOSIS — K851 Biliary acute pancreatitis without necrosis or infection: Secondary | ICD-10-CM | POA: Diagnosis not present

## 2024-02-26 HISTORY — PX: CHOLECYSTECTOMY: SHX55

## 2024-02-26 LAB — GLUCOSE, CAPILLARY
Glucose-Capillary: 171 mg/dL — ABNORMAL HIGH (ref 70–99)
Glucose-Capillary: 140 mg/dL — ABNORMAL HIGH (ref 70–99)
Glucose-Capillary: 128 mg/dL — ABNORMAL HIGH (ref 70–99)
Glucose-Capillary: 171 mg/dL — ABNORMAL HIGH (ref 70–99)
Glucose-Capillary: 219 mg/dL — ABNORMAL HIGH (ref 70–99)
Glucose-Capillary: 326 mg/dL — ABNORMAL HIGH (ref 70–99)

## 2024-02-26 MED ORDER — BUPIVACAINE-EPINEPHRINE 0.25% -1:200000 IJ SOLN
INTRAMUSCULAR | Status: DC | PRN
  Administered 2024-02-26: 30 mL

## 2024-02-26 MED ORDER — PROPOFOL 10 MG/ML IV BOLUS
INTRAVENOUS | Status: DC | PRN
  Administered 2024-02-26: 100 mg via INTRAVENOUS

## 2024-02-26 MED ORDER — FENTANYL CITRATE (PF) 100 MCG/2ML IJ SOLN
INTRAMUSCULAR | Status: AC
  Filled 2024-02-26: qty 2

## 2024-02-26 MED ORDER — PROPOFOL 10 MG/ML IV BOLUS
INTRAVENOUS | Status: AC
  Filled 2024-02-26: qty 20

## 2024-02-26 MED ORDER — FENTANYL CITRATE (PF) 250 MCG/5ML IJ SOLN
INTRAMUSCULAR | Status: DC | PRN
  Administered 2024-02-26: 100 ug via INTRAVENOUS

## 2024-02-26 MED ORDER — LIDOCAINE 2% (20 MG/ML) 5 ML SYRINGE
INTRAMUSCULAR | Status: AC
  Filled 2024-02-26: qty 5

## 2024-02-26 MED ORDER — PHENYLEPHRINE 80 MCG/ML (10ML) SYRINGE FOR IV PUSH (FOR BLOOD PRESSURE SUPPORT)
PREFILLED_SYRINGE | INTRAVENOUS | Status: DC | PRN
  Administered 2024-02-26: 160 ug via INTRAVENOUS
  Administered 2024-02-26: 80 ug via INTRAVENOUS

## 2024-02-26 MED ORDER — AMISULPRIDE (ANTIEMETIC) 5 MG/2ML IV SOLN
5.0000 mg | Freq: Once | INTRAVENOUS | Status: AC | PRN
  Administered 2024-02-26: 5 mg via INTRAVENOUS

## 2024-02-26 MED ORDER — ROCURONIUM BROMIDE 10 MG/ML (PF) SYRINGE
PREFILLED_SYRINGE | INTRAVENOUS | Status: AC
  Filled 2024-02-26: qty 10

## 2024-02-26 MED ORDER — AMISULPRIDE (ANTIEMETIC) 5 MG/2ML IV SOLN
INTRAVENOUS | Status: AC
  Filled 2024-02-26: qty 4

## 2024-02-26 MED ORDER — CHLORHEXIDINE GLUCONATE 0.12 % MT SOLN: 15.0000 mL | Freq: Once | OROMUCOSAL | Status: AC

## 2024-02-26 MED ORDER — PHENYLEPHRINE HCL-NACL 20-0.9 MG/250ML-% IV SOLN
INTRAVENOUS | Status: DC | PRN
  Administered 2024-02-26: 40 ug/min via INTRAVENOUS

## 2024-02-26 MED ORDER — ACETAMINOPHEN 500 MG PO TABS
1000.0000 mg | ORAL_TABLET | Freq: Four times a day (QID) | ORAL | Status: DC
  Administered 2024-02-26 – 2024-02-27 (×4): 1000 mg via ORAL
  Filled 2024-02-26 (×4): qty 2

## 2024-02-26 MED ORDER — SODIUM CHLORIDE 0.9 % IR SOLN
Status: DC | PRN
  Administered 2024-02-26: 1000 mL

## 2024-02-26 MED ORDER — HYDROMORPHONE HCL 1 MG/ML IJ SOLN: 0.2500 mg | INTRAMUSCULAR | Status: DC | PRN

## 2024-02-26 MED ORDER — LIDOCAINE 2% (20 MG/ML) 5 ML SYRINGE
INTRAMUSCULAR | Status: DC | PRN
  Administered 2024-02-26: 100 mg via INTRAVENOUS

## 2024-02-26 MED ORDER — LACTATED RINGERS IV SOLN: INTRAVENOUS | Status: DC

## 2024-02-26 MED ORDER — DEXAMETHASONE SOD PHOSPHATE PF 10 MG/ML IJ SOLN
INTRAMUSCULAR | Status: AC
  Filled 2024-02-26: qty 1

## 2024-02-26 MED ORDER — OXYCODONE HCL 5 MG PO TABS
5.0000 mg | ORAL_TABLET | ORAL | Status: DC | PRN
  Administered 2024-02-27: 5 mg via ORAL
  Filled 2024-02-26 (×2): qty 1

## 2024-02-26 MED ORDER — MORPHINE SULFATE (PF) 2 MG/ML IV SOLN
1.0000 mg | INTRAVENOUS | Status: DC | PRN
  Administered 2024-02-27: 2 mg via INTRAVENOUS
  Filled 2024-02-26: qty 1

## 2024-02-26 MED ORDER — DEXAMETHASONE SOD PHOSPHATE PF 10 MG/ML IJ SOLN
INTRAMUSCULAR | Status: DC | PRN
  Administered 2024-02-26: 5 mg via INTRAVENOUS

## 2024-02-26 MED ORDER — INDOCYANINE GREEN 25 MG IJ SOLR
1.2500 mg | Freq: Once | INTRAMUSCULAR | Status: AC
  Administered 2024-02-26: 1.25 mg via INTRAVENOUS
  Filled 2024-02-26: qty 10

## 2024-02-26 MED ORDER — INSULIN ASPART 100 UNIT/ML IJ SOLN: 0.0000 [IU] | INTRAMUSCULAR | Status: DC | PRN

## 2024-02-26 MED ORDER — SUGAMMADEX SODIUM 200 MG/2ML IV SOLN
INTRAVENOUS | Status: DC | PRN
  Administered 2024-02-26: 200 mg via INTRAVENOUS

## 2024-02-26 MED ORDER — ROCURONIUM BROMIDE 50 MG/5ML IV SOSY
PREFILLED_SYRINGE | INTRAVENOUS | Status: DC | PRN
  Administered 2024-02-26: 60 mg via INTRAVENOUS

## 2024-02-26 MED ORDER — CHLORHEXIDINE GLUCONATE 0.12 % MT SOLN
OROMUCOSAL | Status: AC
  Administered 2024-02-26: 15 mL via OROMUCOSAL
  Filled 2024-02-26: qty 15

## 2024-02-26 MED ORDER — METHOCARBAMOL 500 MG PO TABS: 500.0000 mg | ORAL_TABLET | Freq: Four times a day (QID) | ORAL | Status: DC | PRN

## 2024-02-26 MED ORDER — ONDANSETRON HCL 4 MG/2ML IJ SOLN
INTRAMUSCULAR | Status: AC
  Filled 2024-02-26: qty 2

## 2024-02-26 MED ORDER — 0.9 % SODIUM CHLORIDE (POUR BTL) OPTIME
TOPICAL | Status: DC | PRN
  Administered 2024-02-26: 1000 mL

## 2024-02-26 MED ORDER — ONDANSETRON HCL 4 MG/2ML IJ SOLN
INTRAMUSCULAR | Status: DC | PRN
  Administered 2024-02-26: 4 mg via INTRAVENOUS

## 2024-02-26 MED ORDER — ORAL CARE MOUTH RINSE: 15.0000 mL | Freq: Once | OROMUCOSAL | Status: AC

## 2024-02-26 NOTE — Progress Notes (Signed)
 " PROGRESS NOTE    Heather Petty  FMW:[id number] DOB: 1950-03-19 DOA: 02/22/2024 PCP: System, Provider Not In  Subjective: Patient reports feeling nervous about her surgery but otherwise her pain is much improved overall. Denied any nausea   Hospital Course: 74 year old female with PMH of Hodgkin lymphoma (s/p completed chemotherapy), sacral ulcer, chronic transaminitis, hepatic steatosis, asthma, DM 2, morbid obesity, schizophrenia, HLD, HTN, prior CVA who presented to the ED with abdominal pain associate with nausea and vomiting.  CT abdomen/pelvis showed questionable subtle peripancreatic haziness, possible pancreatitis.  She had elevated lipase levels of 2800.  She was managed for acute pancreatitis. MRCP was done that showed acute pancreatitis with mild edema, cholelithiasis, with mild dilatation of the CBD and no choledocholithiasis.  GI was consulted, surgery was consulted and plan for a lap chole    Assessment and Plan:  Acute gallstone pancreatitis Transaminitis History of abnormal liver function/hepatic steatosis Concern for early development of acute cholangitis - CT abdomen pelvis showed subtle peripancreatic haziness, correlate with laboratory values for possible pancreatitis. Distended gallbladder with small stones. Diverticular disease of the colon without acute wall thickening. Right upper quadrant ultrasound showed cholelithiasis without evidence of acute cholecystitis.  Normal biliary ductal diameter and no intrahepatic ductal dilation. MRCP demonstrated Acute pancreatitis with mild edema, most likely due to gallstone pancreatitis. Cholelithiasis with mild fusiform dilatation of the common bile duct to 9 mm and no choledocholithiasis. Seen by GI as well  - lipase was elevated to 2800 - continue pain control with current management - continue IV zosyn  for now  - general surgery following, plan for cholecystectomy today     History of Hodgkin lymphoma -History of Hodgkin  lymphoma stage III status post chemotherapy and currently in remission follows oncology.  Elevated lactic acid 2.3 -Resolved to 1.5 with IV fluids. Patient did not meet sepsis criteria.   Essential hypertension -Continue Coreg  6.25 mg twice daily.   Insulin -dependent DM type II - on 10U lantus  and SSI for now. Monitor and adjust doses as appropriate post op   History of schizophrenia -Continue doxepin  and Atarax    History of sacral ulcer -Continue turn patient every 2 hours and air mattress   History of asthma -Stable.  Continue albuterol  as needed    DVT prophylaxis: SCDs Start: 02/23/24 0256 Place TED hose Start: 02/23/24 0256    Code Status: Full Code Family Communication: Updated over the phone Disposition Plan: TBD Reason for continuing need for hospitalization: Lap chole  Objective: Vitals:   02/26/24 0415 02/26/24 0737 02/26/24 1046 02/26/24 1147  BP: (!) 145/48 (!) 140/55 (!) 144/78 99/70  Pulse: 70 73 71 71  Resp: 20 18 18 18   Temp: 98.4 F (36.9 C) 98.6 F (37 C) 98.7 F (37.1 C) 97.7 F (36.5 C)  TempSrc: Oral Oral Oral Oral  SpO2: 92% 91% 94% 94%  Weight:      Height:        Intake/Output Summary (Last 24 hours) at 02/26/2024 1343 Last data filed at 02/26/2024 0600 Gross per 24 hour  Intake 519.01 ml  Output 1100 ml  Net -580.99 ml   Filed Weights   02/22/24 1953  Weight: 102.1 kg    Examination:  Physical Exam Vitals and nursing note reviewed.  Constitutional:      General: She is not in acute distress. Cardiovascular:     Rate and Rhythm: Normal rate.  Pulmonary:     Effort: No respiratory distress.     Breath sounds: No wheezing.  Abdominal:     General: There is no distension.     Palpations: Abdomen is soft.     Tenderness: There is abdominal tenderness (Mild).  Musculoskeletal:     Right lower leg: No edema.     Left lower leg: No edema.     Data Reviewed: I have personally reviewed following labs and imaging  studies  CBC: Recent Labs  Lab 02/22/24 2123 02/22/24 2126 02/23/24 0428 02/24/24 0229 02/25/24 0248  WBC  --  9.5 8.7 9.4 7.8  NEUTROABS  --   --   --  6.9 5.3  HGB 15.0 13.6 12.0 10.6* 10.2*  HCT 44.0 40.4 36.2 32.5* 30.5*  MCV  --  96.0 96.5 98.5 97.4  PLT  --  153 141* PLATELET CLUMPS NOTED ON SMEAR, COUNT APPEARS DECREASED 110*   Basic Metabolic Panel: Recent Labs  Lab 02/22/24 2022 02/22/24 2123 02/23/24 0428 02/24/24 0229 02/25/24 0248  NA 138 140 140 137 136  K 4.2 4.0 3.3* 3.8 3.4*  CL 102 100 102 102 100  CO2 22  --  27 24 26   GLUCOSE 220* 225* 127* 111* 233*  BUN 13 15 12 12 8   CREATININE 0.71 0.60 0.70 0.83 0.66  CALCIUM  9.7  --  9.3 8.3* 8.6*   GFR: Estimated Creatinine Clearance: 68 mL/min (by C-G formula based on SCr of 0.66 mg/dL). Liver Function Tests: Recent Labs  Lab 02/22/24 2022 02/23/24 0428 02/24/24 0229 02/25/24 0248  AST 321* 238* 75* 28  ALT 209* 235* 120* 82*  ALKPHOS 240* 269* 157* 161*  BILITOT 1.9* 0.9 0.7 0.6  PROT 7.3 6.5 5.5* 5.9*  ALBUMIN 3.9 3.8 3.0* 3.2*   Recent Labs  Lab 02/22/24 2022 02/24/24 0229 02/25/24 0248  LIPASE >2,800* 63* 19   No results for input(s): AMMONIA in the last 168 hours. Coagulation Profile: No results for input(s): INR, PROTIME in the last 168 hours. Cardiac Enzymes: No results for input(s): CKTOTAL, CKMB, CKMBINDEX, TROPONINI in the last 168 hours. ProBNP, BNP (last 5 results) No results for input(s): PROBNP, BNP in the last 8760 hours. HbA1C: Recent Labs    02/25/24 0248  HGBA1C 7.2*   CBG: Recent Labs  Lab 02/25/24 1615 02/25/24 2101 02/26/24 0559 02/26/24 1044 02/26/24 1316  GLUCAP 245* 273* 171* 140* 128*   Lipid Profile: No results for input(s): CHOL, HDL, LDLCALC, TRIG, CHOLHDL, LDLDIRECT in the last 72 hours. Thyroid  Function Tests: No results for input(s): TSH, T4TOTAL, FREET4, T3FREE, THYROIDAB in the last 72 hours. Anemia  Panel: No results for input(s): VITAMINB12, FOLATE, FERRITIN, TIBC, IRON, RETICCTPCT in the last 72 hours. Sepsis Labs: Recent Labs  Lab 02/23/24 0115 02/23/24 0252  LATICACIDVEN 2.3* 1.5    Recent Results (from the past 240 hours)  Resp panel by RT-PCR (RSV, Flu A&B, Covid) Anterior Nasal Swab     Status: None   Collection Time: 02/22/24  8:14 PM   Specimen: Anterior Nasal Swab  Result Value Ref Range Status   SARS Coronavirus 2 by RT PCR NEGATIVE NEGATIVE Final   Influenza A by PCR NEGATIVE NEGATIVE Final   Influenza B by PCR NEGATIVE NEGATIVE Final    Comment: (NOTE) The Xpert Xpress SARS-CoV-2/FLU/RSV plus assay is intended as an aid in the diagnosis of influenza from Nasopharyngeal swab specimens and should not be used as a sole basis for treatment. Nasal washings and aspirates are unacceptable for Xpert Xpress SARS-CoV-2/FLU/RSV testing.  Fact Sheet for Patients: bloggercourse.com  Fact Sheet for Healthcare Providers:  seriousbroker.it  This test is not yet approved or cleared by the [place]  FDA and has been authorized for detection and/or diagnosis of SARS-CoV-2 by FDA under an Emergency Use Authorization (EUA). This EUA will remain in effect (meaning this test can be used) for the duration of the COVID-19 declaration under Section 564(b)(1) of the Act, 21 U.S.C. section 360bbb-3(b)(1), unless the authorization is terminated or revoked.     Resp Syncytial Virus by PCR NEGATIVE NEGATIVE Final    Comment: (NOTE) Fact Sheet for Patients: bloggercourse.com  Fact Sheet for Healthcare Providers: seriousbroker.it  This test is not yet approved or cleared by the [place]  FDA and has been authorized for detection and/or diagnosis of SARS-CoV-2 by FDA under an Emergency Use Authorization (EUA). This EUA will remain in effect (meaning this test can be  used) for the duration of the COVID-19 declaration under Section 564(b)(1) of the Act, 21 U.S.C. section 360bbb-3(b)(1), unless the authorization is terminated or revoked.  Performed at [facility], [address] [place], [place] [address]   Blood culture (routine x 2)     Status: None (Preliminary result)   Collection Time: 02/23/24  2:02 AM   Specimen: BLOOD LEFT ARM  Result Value Ref Range Status   Specimen Description BLOOD LEFT ARM  Final   Special Requests   Final    BOTTLES DRAWN AEROBIC AND ANAEROBIC Blood Culture adequate volume   Culture   Final    NO GROWTH 3 DAYS Performed at [facility], [address], [place], [place] [address]    Report Status PENDING  Incomplete  Blood culture (routine x 2)     Status: None (Preliminary result)   Collection Time: 02/24/24  2:25 AM   Specimen: BLOOD RIGHT ARM  Result Value Ref Range Status   Specimen Description BLOOD RIGHT ARM  Final   Special Requests   Final    BOTTLES DRAWN AEROBIC AND ANAEROBIC Blood Culture adequate volume PATIENT ON FOLLOWING PIPERACILLIN ,TAZOBACTAM   Culture   Final    NO GROWTH 2 DAYS Performed at [facility], [address] [place], [place] [address]    Report Status PENDING  Incomplete  MRSA Next Gen by PCR, Nasal     Status: Abnormal   Collection Time: 02/24/24  5:10 PM   Specimen: Nasal Mucosa; Nasal Swab  Result Value Ref Range Status   MRSA by PCR Next Gen DETECTED (A) NOT DETECTED Final    Comment: RESULT CALLED TO, READ BACK BY AND VERIFIED WITH: RN TYRONNE BATTLE [phone] AT 1943 ADC (NOTE) The GeneXpert MRSA Assay (FDA approved for NASAL specimens only), is one component of a comprehensive MRSA colonization surveillance program. It is not intended to diagnose MRSA infection nor to guide or monitor treatment for MRSA infections. Test performance is not FDA approved in patients less than 89 years old. Performed at [facility], [address], [place],  [place] [address]   Surgical pcr screen     Status: Abnormal   Collection Time: 02/25/24  8:22 PM   Specimen: Nasal Mucosa; Nasal Swab  Result Value Ref Range Status   MRSA, PCR POSITIVE (A) NEGATIVE Final    Comment: CRITICAL RESULT CALLED TO, READ BACK BY AND VERIFIED WITH:  ANN RN 02/25/2024 BY DD @ 2354    Staphylococcus aureus POSITIVE (A) NEGATIVE Final    Comment: CRITICAL RESULT CALLED TO, READ BACK BY AND VERIFIED WITH:  ANN RN 02/25/2024 BY DD @  2354 (NOTE) The Xpert SA Assay (FDA approved for NASAL specimens in patients 51 years of age and older), is one component of a comprehensive surveillance program. It is not intended to diagnose infection nor to guide or monitor treatment. Performed at [facility], [address], [place], [place] [address]      Radiology Studies: No results found.  Scheduled Meds:  [MAR Hold] carvedilol   6.25 mg Oral BID WC   [MAR Hold] Chlorhexidine  Gluconate Cloth  6 each Topical Daily   [MAR Hold] doxepin   25 mg Oral QHS   [MAR Hold] enoxaparin  (LOVENOX ) injection  50 mg Subcutaneous Q24H   [MAR Hold] hydrOXYzine   25 mg Oral BID   [MAR Hold] insulin  aspart  0-15 Units Subcutaneous TID WC   [MAR Hold] insulin  aspart  0-5 Units Subcutaneous QHS   [MAR Hold] insulin  glargine  10 Units Subcutaneous QHS   [MAR Hold] mupirocin  ointment  1 Application Nasal BID   [MAR Hold] pantoprazole  (PROTONIX ) IV  40 mg Intravenous Q24H   [MAR Hold] sodium chloride  flush  3 mL Intravenous Q12H   Continuous Infusions:  lactated ringers  10 mL/hr at 02/26/24 1200   [MAR Hold] piperacillin -tazobactam (ZOSYN )  IV 3.375 g (02/26/24 0849)     LOS: 3 days   Time spent: 40 minutes  Casimer Dare, MD  [facility]  02/26/2024, 1:43 PM   "

## 2024-02-26 NOTE — Plan of Care (Signed)

## 2024-02-26 NOTE — Anesthesia Preprocedure Evaluation (Addendum)
 "                                  Anesthesia Evaluation  Patient identified by MRN, date of birth, ID band Patient awake    Reviewed: Allergy & Precautions, NPO status , Patient's Chart, lab work & pertinent test results  Airway Mallampati: III  TM Distance: >3 FB Neck ROM: Full    Dental  (+) Dental Advisory Given, Teeth Intact, Loose, Chipped, Missing, Poor Dentition   Pulmonary asthma , pneumonia, former smoker   breath sounds clear to auscultation + decreased breath sounds      Cardiovascular hypertension, Pt. on home beta blockers and Pt. on medications + Past MI   Rhythm:Regular Rate:Normal  Echo 07/2021  1. Left ventricular ejection fraction, by estimation, is 60 to 65%. The  left ventricle has normal function. The left ventricle has no regional  wall motion abnormalities. Left ventricular diastolic parameters are  consistent with Grade I diastolic dysfunction (impaired relaxation).  The average left ventricular global longitudinal strain is -19.1 %.  The global longitudinal strain is normal.   2. Right ventricular systolic function is normal. The right ventricular  size is normal. There is normal pulmonary artery systolic pressure.   3. The mitral valve is normal in structure. No evidence of mitral valve  regurgitation. No evidence of mitral stenosis.   4. The aortic valve was not well visualized. Aortic valve regurgitation  is not visualized. No aortic stenosis is present.   5. The inferior vena cava is normal in size with greater than 50%  respiratory variability, suggesting right atrial pressure of 3 mmHg.     1. Left ventricular ejection fraction, by estimation, is 65 to 70%. The  left ventricle has normal function. The left ventricle has no regional  wall motion abnormalities. There is mild concentric left ventricular  hypertrophy. Left ventricular diastolic  parameters are consistent with Grade I diastolic dysfunction (impaired  relaxation).   2. Right ventricular systolic function is normal. The right ventricular  size is normal. There is normal pulmonary artery systolic pressure. The  estimated right ventricular systolic pressure is 27.6 mmHg.  3. The mitral valve is normal in structure. No evidence of mitral valve  regurgitation. No evidence of mitral stenosis.  4. The aortic valve has an indeterminant number of cusps. Aortic valve  regurgitation is not visualized. Mild to moderate aortic valve  sclerosis/calcification is present, without any evidence of aortic  stenosis.  5. The inferior vena cava is normal in size with greater than 50%  respiratory variability, suggesting right atrial pressure of 3 mmHg.   Conclusion(s)/Recommendation(s): No evidence of valvular vegetations on  this transthoracic echocardiogram. Would recommend a transesophageal  echocardiogram to exclude infective endocarditis if clinically indicated.    Neuro/Psych  PSYCHIATRIC DISORDERS  Depression  Schizophrenia  CVA    GI/Hepatic ,GERD  ,,(+) Hepatitis -  Endo/Other  diabetes  Class 3 obesity  Renal/GU Renal disease     Musculoskeletal  (+) Arthritis ,    Abdominal  (+) + obese  Peds  Hematology  (+) Blood dyscrasia, anemia   Anesthesia Other Findings   Reproductive/Obstetrics                              Anesthesia Physical Anesthesia Plan  ASA: 4  Anesthesia Plan: General   Post-op Pain Management: Ofirmev  IV (  intra-op)*   Induction: Intravenous  PONV Risk Score and Plan: 4 or greater and Ondansetron , Dexamethasone  and Treatment may vary due to age or medical condition  Airway Management Planned: Oral ETT  Additional Equipment:   Intra-op Plan:   Post-operative Plan: Extubation in OR  Informed Consent: I have reviewed the patients History and Physical, chart, labs and discussed the procedure including the risks, benefits and alternatives for the proposed anesthesia with the patient or  authorized representative who has indicated his/her understanding and acceptance.     Dental advisory given  Plan Discussed with: CRNA  Anesthesia Plan Comments: (Wheelchair bound.  Risks of anesthesia explained at length. This includes, but is not limited to, sore throat, damage to teeth, lips gums, tongue and vocal cords, nausea and vomiting, reactions to medications, stroke, heart attack, and death. All patient questions were answered and the patient wishes to proceed. )         Anesthesia Quick Evaluation  "

## 2024-02-26 NOTE — Op Note (Addendum)
 Date: 02/26/24  Patient: Heather Petty MRN: [id number]  Preoperative Diagnosis: Biliary pancreatitis Postoperative Diagnosis: Biliary pancreatitis, acute cholecystitis  Procedure: Laparoscopic cholecystectomy with fluorescence ICG cholangiography  Surgeon: Leonor Dawn, MD Assistant: Burnard Banter, PA-C  EBL: 100 mL  Anesthesia: General endotracheal  Specimens: Gallbladder  Indications: Ms. Elden is a 74 yo female who presented with acute abdominal pain and was found to have acute biliary pancreatitis. Imaging workup confirmed gallstones. After a discussion of the risks and benefits of surgery, she consented to proceed with cholecystectomy.   Findings: Cholelithiasis with signs of acute cholecystitis.  Hepatic steatosis.  Procedure details: Informed consent was obtained in the preoperative area prior to the procedure. The patient was brought to the operating room and placed on the table in the supine position.  General anesthesia was induced and appropriate lines and drains were placed for intraoperative monitoring. Perioperative antibiotics were administered per SCIP guidelines. The abdomen was prepped and draped in the usual sterile fashion. A pre-procedure timeout was taken verifying patient identity, surgical site and procedure to be performed.  A small supraumbilical skin incision was made, the umbilical stalk was grasped and elevated, and a Veress needle was inserted through the fascia. Intraperitoneal placement was confirmed with the saline drop test and the abdomen was insufflated. A 5mm Visiport was placed, and the peritoneal cavity was inspected with no evidence of visceral or vascular injury. A 12mm subxiphoid port was placed, followed by two 5mm ports in the right lateral costal margin, all under direct visualization. The fundus of the gallbladder was grasped and retracted cephalad. The infundibulum was retracted laterally. The cystic triangle was dissected out using cautery  and blunt dissection.  There was edema in the gallbladder wall consistent with acute cholecystitis.  There was bleeding from the cystic artery during the dissection.  This was controlled with clip placement on the proximal cystic artery.  The cystic duct was further dissected out but it appeared dilated and very foreshortened.  ICG was used to help identify the biliary anatomy.  There was inflammatory tissue in the cystic triangle, which made it difficult to dissect out the duct.  Thus I elected to perform a dome down approach.  The gallbladder was taken off the liver starting at the top and working inferiorly using cautery.  The cystic duct then became visible once the gallbladder was completely separated from the liver.  The cystic duct was foreshortened.  A PDS Endoloop was placed on the cystic duct, and the duct was transected.  The gallbladder was placed in an Endo Catch bag and extracted via the subxiphoid port site.  The surgical site was irrigated with saline until the effluent was clear. Hemostasis was achieved in the gallbladder fossa using cautery. The cystic duct and artery stumps were visually inspected and there was no evidence of bleeding. There was no evidence of bile leak on the cystic duct stump, however the Endoloop was close to the cuff of the cystic duct stump, so an additional PDS Endoloop was placed securely on the cystic duct stump. The specimen was extracted via the subxiphoid port, and the fascia at this port site was closed with a 0 Vicryl suture using a PMI. The abdomen was desufflated and the remaining ports were removed. The skin at all port sites was closed with 4-0 monocryl subcuticular suture. Dermabond was applied.    The patient tolerated the procedure well with no apparent complications.  All counts were correct x2 at the end of the procedure. The patient  was extubated and taken to PACU in stable condition.  Leonor Dawn, MD 02/26/24 3:39 PM

## 2024-02-26 NOTE — Plan of Care (Signed)
   Problem: Education: Goal: Knowledge of General Education information will improve Description: Including pain rating scale, medication(s)/side effects and non-pharmacologic comfort measures Outcome: Progressing   Problem: Coping: Goal: Level of anxiety will decrease Outcome: Progressing

## 2024-02-26 NOTE — Discharge Instructions (Signed)

## 2024-02-26 NOTE — Transfer of Care (Signed)
 Immediate Anesthesia Transfer of Care Note  Patient: Heather Petty  Procedure(s) Performed: LAPAROSCOPIC CHOLECYSTECTOMY WITH INTRAOPERATIVE CHOLANGIOGRAM  Patient Location: PACU  Anesthesia Type:General  Level of Consciousness: drowsy  Airway & Oxygen Therapy: Patient Spontanous Breathing and Patient connected to face mask oxygen  Post-op Assessment: Report given to RN and Post -op Vital signs reviewed and stable  Post vital signs: Reviewed and stable  Last Vitals:  Vitals Value Taken Time  BP 141/66   Temp    Pulse 63 02/26/24 15:57  Resp 17   SpO2 95 % 02/26/24 15:57  Vitals shown include unfiled device data.  Last Pain:  Vitals:   02/26/24 1300  TempSrc:   PainSc: (P) 0-No pain      Patients Stated Pain Goal: 3 (02/25/24 0810)  Complications: No notable events documented.

## 2024-02-26 NOTE — Progress Notes (Addendum)
 "      Subjective: Patient with improvement in her pain today.  Still feels sore, but better.  No other complaints this am.  ROS: See above, otherwise other systems negative  Objective: Vital signs in last 24 hours: Temp:  [98 F (36.7 C)-98.8 F (37.1 C)] 98.6 F (37 C) (01/20 0737) Pulse Rate:  [67-76] 73 (01/20 0737) Resp:  [18-20] 18 (01/20 0737) BP: (130-162)/(48-69) 140/55 (01/20 0737) SpO2:  [90 %-97 %] 91 % (01/20 0737) Last BM Date : 02/22/24  Intake/Output from previous day: 01/19 0701 - 01/20 0700 In: 809 [P.O.:600; IV Piggyback:209] Out: 1100 [Urine:1100] Intake/Output this shift: No intake/output data recorded.  PE: Gen: NAD Lungs: effort unlabored Abd: soft, essentially NT this am, ND, obese  Lab Results:  Recent Labs    02/24/24 0229 02/25/24 0248  WBC 9.4 7.8  HGB 10.6* 10.2*  HCT 32.5* 30.5*  PLT PLATELET CLUMPS NOTED ON SMEAR, COUNT APPEARS DECREASED 110*   BMET Recent Labs    02/24/24 0229 02/25/24 0248  NA 137 136  K 3.8 3.4*  CL 102 100  CO2 24 26  GLUCOSE 111* 233*  BUN 12 8  CREATININE 0.83 0.66  CALCIUM  8.3* 8.6*   PT/INR No results for input(s): LABPROT, INR in the last 72 hours. CMP     Component Value Date/Time   NA 136 02/25/2024 0248   K 3.4 (L) 02/25/2024 0248   CL 100 02/25/2024 0248   CO2 26 02/25/2024 0248   GLUCOSE 233 (H) 02/25/2024 0248   BUN 8 02/25/2024 0248   CREATININE 0.66 02/25/2024 0248   CREATININE 0.82 11/28/2023 1120   CALCIUM  8.6 (L) 02/25/2024 0248   PROT 5.9 (L) 02/25/2024 0248   ALBUMIN 3.2 (L) 02/25/2024 0248   AST 28 02/25/2024 0248   AST 16 11/28/2023 1120   ALT 82 (H) 02/25/2024 0248   ALT 14 11/28/2023 1120   ALKPHOS 161 (H) 02/25/2024 0248   BILITOT 0.6 02/25/2024 0248   BILITOT 0.5 11/28/2023 1120   GFRNONAA >60 02/25/2024 0248   GFRNONAA >60 11/28/2023 1120   GFRAA >60 09/08/2019 0050   Lipase     Component Value Date/Time   LIPASE 19 02/25/2024 0248        Studies/Results: No results found.  Anti-infectives: Anti-infectives (From admission, onward)    Start     Dose/Rate Route Frequency Ordered Stop   02/23/24 0800  piperacillin -tazobactam (ZOSYN ) IVPB 3.375 g        3.375 g 12.5 mL/hr over 240 Minutes Intravenous Every 8 hours 02/23/24 0059     02/23/24 0100  piperacillin -tazobactam (ZOSYN ) IVPB 3.375 g        3.375 g 100 mL/hr over 30 Minutes Intravenous  Once 02/23/24 0059 02/23/24 0234        Assessment/Plan Gallstone pancreatitis -labs improving -pancreatitis improving and pain much better -safe to proceed with OR today for lap chole with IOC -cont NPO for now -d/w patient surgery and post op expectations, etc.  She understands and is agreeable to proceed today  -d/w primary service  FEN - NPO/IVFs VTE - Lovenox  ID - Zosyn   DM HTN GERD H/O CVA Deconditioned with WC dependence Hx of hep A Hodgkin's lymphoma, stage III, chemo completed Schizophrenia  I reviewed hospitalist notes, last 24 h vitals and pain scores, last 48 h intake and output, last 24 h labs and trends, and last 24 h imaging results.   LOS: 3 days    Burnard FORBES Banter ,  PA-C [facility] 02/26/2024, 8:02 AM Please see Amion for pager number during day hours 7:00am-4:30pm or 7:00am -11:30am on weekends  "

## 2024-02-26 NOTE — Anesthesia Procedure Notes (Signed)
 Procedure Name: Intubation Date/Time: 02/26/2024 2:37 PM  Performed by: Laverda Burnard LABOR, CRNAPre-anesthesia Checklist: Patient identified, Emergency Drugs available, Suction available and Patient being monitored Patient Re-evaluated:Patient Re-evaluated prior to induction Oxygen Delivery Method: Circle system utilized Preoxygenation: Pre-oxygenation with 100% oxygen Induction Type: IV induction Ventilation: Mask ventilation without difficulty Laryngoscope Size: Miller and 3 Grade View: Grade I Tube type: Oral Tube size: 7.0 mm Number of attempts: 1 Airway Equipment and Method: Stylet Placement Confirmation: ETT inserted through vocal cords under direct vision, positive ETCO2 and breath sounds checked- equal and bilateral Secured at: 21 cm Tube secured with: Tape Dental Injury: Teeth and Oropharynx as per pre-operative assessment

## 2024-02-26 NOTE — Anesthesia Postprocedure Evaluation (Signed)
"   Anesthesia Post Note  Patient: Matha Fagerstrom  Procedure(s) Performed: LAPAROSCOPIC CHOLECYSTECTOMY WITH INTRAOPERATIVE CHOLANGIOGRAM     Patient location during evaluation: PACU Anesthesia Type: General Level of consciousness: sedated and patient cooperative Pain management: pain level controlled Vital Signs Assessment: post-procedure vital signs reviewed and stable Respiratory status: spontaneous breathing Cardiovascular status: stable Anesthetic complications: no   No notable events documented.  Last Vitals:  Vitals:   02/26/24 1649 02/26/24 1918  BP:  (!) 148/75  Pulse:  83  Resp: 18 18  Temp: 36.9 C 37.8 C  SpO2:  93%    Last Pain:  Vitals:   02/26/24 1918  TempSrc: Oral  PainSc:                  Norleen Pope      "

## 2024-02-27 ENCOUNTER — Encounter (HOSPITAL_COMMUNITY): Payer: Self-pay | Admitting: Surgery

## 2024-02-27 ENCOUNTER — Telehealth: Payer: Self-pay | Admitting: Hematology and Oncology

## 2024-02-27 DIAGNOSIS — K851 Biliary acute pancreatitis without necrosis or infection: Secondary | ICD-10-CM | POA: Diagnosis not present

## 2024-02-27 LAB — CBC
HCT: 34.6 % — ABNORMAL LOW (ref 36.0–46.0)
Hemoglobin: 11.2 g/dL — ABNORMAL LOW (ref 12.0–15.0)
MCH: 31.3 pg (ref 26.0–34.0)
MCHC: 32.4 g/dL (ref 30.0–36.0)
MCV: 96.6 fL (ref 80.0–100.0)
Platelets: 122 K/uL — ABNORMAL LOW (ref 150–400)
RBC: 3.58 MIL/uL — ABNORMAL LOW (ref 3.87–5.11)
RDW: 15 % (ref 11.5–15.5)
WBC: 4.3 K/uL (ref 4.0–10.5)
nRBC: 0 % (ref 0.0–0.2)

## 2024-02-27 LAB — GLUCOSE, CAPILLARY
Glucose-Capillary: 346 mg/dL — ABNORMAL HIGH (ref 70–99)
Glucose-Capillary: 406 mg/dL — ABNORMAL HIGH (ref 70–99)

## 2024-02-27 LAB — BASIC METABOLIC PANEL WITH GFR
Anion gap: 15 (ref 5–15)
BUN: 14 mg/dL (ref 8–23)
CO2: 24 mmol/L (ref 22–32)
Calcium: 9.2 mg/dL (ref 8.9–10.3)
Chloride: 95 mmol/L — ABNORMAL LOW (ref 98–111)
Creatinine, Ser: 0.91 mg/dL (ref 0.44–1.00)
GFR, Estimated: >60 mL/min
Glucose, Bld: 378 mg/dL — ABNORMAL HIGH (ref 70–99)
Potassium: 4.1 mmol/L (ref 3.5–5.1)
Sodium: 134 mmol/L — ABNORMAL LOW (ref 135–145)

## 2024-02-27 MED ORDER — INSULIN GLARGINE 100 UNIT/ML ~~LOC~~ SOLN: 24.0000 [IU] | Freq: Every day | SUBCUTANEOUS | Status: DC

## 2024-02-27 MED ORDER — INSULIN ASPART 100 UNIT/ML IJ SOLN: 3.0000 [IU] | Freq: Three times a day (TID) | INTRAMUSCULAR | Status: DC

## 2024-02-27 MED ORDER — HYDRALAZINE HCL 20 MG/ML IJ SOLN
INTRAMUSCULAR | Status: AC
  Filled 2024-02-27: qty 1

## 2024-02-27 MED ORDER — INSULIN GLARGINE 100 UNIT/ML ~~LOC~~ SOLN
15.0000 [IU] | Freq: Two times a day (BID) | SUBCUTANEOUS | Status: DC
  Administered 2024-02-27: 15 [IU] via SUBCUTANEOUS
  Filled 2024-02-27 (×2): qty 0.15

## 2024-02-27 MED ORDER — MORPHINE SULFATE (PF) 2 MG/ML IV SOLN: 1.0000 mg | INTRAVENOUS | Status: DC | PRN

## 2024-02-27 MED ORDER — HYDRALAZINE HCL 20 MG/ML IJ SOLN
5.0000 mg | Freq: Once | INTRAMUSCULAR | Status: AC
  Administered 2024-02-27: 5 mg via INTRAVENOUS

## 2024-02-27 MED ORDER — OXYCODONE HCL 5 MG PO TABS: 5.0000 mg | ORAL_TABLET | ORAL | 0 refills | Status: AC | PRN

## 2024-02-27 MED ORDER — METHOCARBAMOL 500 MG PO TABS
500.0000 mg | ORAL_TABLET | Freq: Four times a day (QID) | ORAL | Status: DC
  Administered 2024-02-27: 500 mg via ORAL
  Filled 2024-02-27: qty 1

## 2024-02-27 MED ORDER — POLYETHYLENE GLYCOL 3350 17 G PO PACK
17.0000 g | PACK | Freq: Every day | ORAL | Status: DC
  Administered 2024-02-27: 17 g via ORAL
  Filled 2024-02-27: qty 1

## 2024-02-27 MED ORDER — POLYETHYLENE GLYCOL 3350 17 G PO PACK: 17.0000 g | PACK | Freq: Every day | ORAL | Status: AC

## 2024-02-27 MED ORDER — ACETAMINOPHEN 500 MG PO TABS: 1000.0000 mg | ORAL_TABLET | Freq: Four times a day (QID) | ORAL | Status: AC | PRN

## 2024-02-27 MED ORDER — ONDANSETRON HCL 4 MG PO TABS: 4.0000 mg | ORAL_TABLET | Freq: Four times a day (QID) | ORAL | Status: AC | PRN

## 2024-02-27 MED ORDER — OXYCODONE HCL 5 MG PO TABS: 5.0000 mg | ORAL_TABLET | ORAL | Status: DC | PRN

## 2024-02-27 MED ORDER — METHOCARBAMOL 500 MG PO TABS: 500.0000 mg | ORAL_TABLET | Freq: Four times a day (QID) | ORAL | Status: AC | PRN

## 2024-02-27 NOTE — Discharge Summary (Addendum)
 "     Physician Discharge Summary  Heather Petty FMW:[id number] DOB: 1950/12/23 DOA: 02/22/2024  PCP: System, Provider Not In  Admit date: 02/22/2024 Discharge date: 02/27/2024  Admitted From:  Discharge disposition: back to LTCF   Recommendations for Outpatient Follow-Up:   Adjust blood sugar medications for better blood sugar control Bowel regimen to avoid constipation Pain control   Discharge Diagnosis:   Principal Problem:   Acute gallstone pancreatitis Active Problems:   Cholelithiasis   Hodgkin lymphoma (HCC)   HTN (hypertension)   Hyperlipidemia associated with type 2 diabetes mellitus (HCC)   DM II (diabetes mellitus, type II), controlled (HCC)   Schizophrenia (HCC)   Chronic transaminitis/hepatic steatosis   Sacral ulcer (HCC)   History of asthma    Discharge Condition: Improved.  Diet recommendation:Carbohydrate-modified.   Wound care: None.  Code status: Full.   History of Present Illness:   Heather Petty is a 74 y.o. female with medical history significant of Hodgkin lymphoma stage III s/p completed chemotherapy, sacral ulcer, chronic transaminitis, hepatic steatosis/possible cirrhosis, asthma, insulin -dependent DM type II, morbid obesity, schizophrenia, hyperlipidemia, osteoarthritis, depression, GERD, essential hypertension, insomnia, and history of CVA presented to emergency department complaining of abdominal pain with associated nausea and vomiting.  Patient has 2 episode of regular bowel movement.  EMS found patient has low-grade temperature. Patient complaining about midepigastric abdominal pain which is intermittent in quality with associated nausea.  Denies any hematemesis.  Denies fever however patient complaining about chill sensation.  Denies chest pain, palpitation shortness of breath.  No other complaint at this time   Hospital Course by Problem:   Acute gallstone pancreatitis Transaminitis History of abnormal liver function/hepatic  steatosis Concern for early development of acute cholangitis - CT abdomen pelvis showed subtle peripancreatic haziness, correlate with laboratory values for possible pancreatitis. Distended gallbladder with small stones. Diverticular disease of the colon without acute wall thickening. Right upper quadrant ultrasound showed cholelithiasis without evidence of acute cholecystitis.  Normal biliary ductal diameter and no intrahepatic ductal dilation. MRCP demonstrated Acute pancreatitis with mild edema, most likely due to gallstone pancreatitis. Cholelithiasis with mild fusiform dilatation of the common bile duct to 9 mm and no choledocholithiasis. Seen by GI as well  - lipase was elevated to 2800 - s/p cholecystectomy 1/20 - Tolerating diet-pain controlled on scheduled Tylenol  and as needed Oxy-have not have a as needed Oxy since 3 AM   History of Hodgkin lymphoma -History of Hodgkin lymphoma stage III status post chemotherapy and currently in remission follows oncology.  Elevated lactic acid 2.3 -Resolved to 1.5 with IV fluids. Patient did not meet sepsis criteria.   Essential hypertension -Continue Coreg  6.25 mg twice daily.   Insulin -dependent DM type II - Blood sugars poorly controlled in the hospital due to low dose of medication - Resume home medications for better blood sugar control   History of schizophrenia -Continue doxepin  and Atarax    History of sacral ulcer -Continue turn patient every 2 hours and air mattress   History of asthma -Stable.  Continue albuterol  as needed    Medical Consultants:  General Surgery    Discharge Exam:   Vitals:   02/27/24 0800 02/27/24 1136  BP:  (!) 112/52  Pulse:  71  Resp:  20  Temp:  98.5 F (36.9 C)  SpO2: 96% 91%   Vitals:   02/27/24 0600 02/27/24 0731 02/27/24 0800 02/27/24 1136  BP: (!) 141/56 (!) 125/52  (!) 112/52  Pulse:  74  71  Resp:  18  20  Temp:  98.7 F (37.1 C)  98.5 F (36.9 C)  TempSrc:  Axillary  Oral   SpO2:  97% 96% 91%  Weight:      Height:        General exam: Appears calm and comfortable.  Ate a good breakfast per patient  The results of significant diagnostics from this hospitalization (including imaging, microbiology, ancillary and laboratory) are listed below for reference.     Procedures and Diagnostic Studies:   MR ABDOMEN MRCP W WO CONTRAST Result Date: 02/23/2024 EXAM: MRCP WITH AND WITHOUT IV CONTRAST 02/23/2024 03:50:00 AM TECHNIQUE: Multisequence, multiplanar magnetic resonance images of the abdomen with and without intravenous contrast. MRCP sequences were performed. 10 cc of gadobutrol  (GADAVIST ) 1 MMOL/ML injection was administered. COMPARISON: CT abdomen and pelvis 02/22/2024 and US  abdomen left limited 02/23/2024. CLINICAL HISTORY: Cholelithiasis; Pancreatitis, acute, severe. FINDINGS: LIVER: Diffuse hepatic steatosis. No focal liver lesion identified. GALLBLADDER AND BILIARY SYSTEM: The gallbladder is distended. Gallstones are noted layering within the dependent portion of the gallbladder. These stones measure up to 3 mm. No gallbladder wall thickening or pericholecystic fat stranding or free fluid. Mild fusiform dilatation of the common bile duct measures up to 9 mm. No choledocholithiasis. No intrahepatic ductal dilation. SPLEEN: Unremarkable. PANCREAS/PANCREATIC DUCT: Mild edema involving the pancreas. No main duct dilatation. No signs of pancreatic necrosis or pseudocyst. ADRENAL GLANDS: Unremarkable. KIDNEYS: A 9 mm Bosniak class I cyst arises off the inferior pole of the left kidney. LYMPH NODES: The retroperitoneal lymph node measures 1.1 cm. VASCULATURE: Aortic atherosclerotic calcification. PERITONEUM: No ascites. ABDOMINAL WALL: No hernia. No mass. BOWEL: Grossly unremarkable. No bowel obstruction. BONES: No abnormality. SOFT TISSUES: Unremarkable. IMPRESSION: 1. Acute pancreatitis with mild edema, most likely gallstone pancreatitis (possible recently passed stone),  without main duct dilatation, necrosis, or pseudocyst. 2. Cholelithiasis with mild fusiform dilatation of the common bile duct to 9 mm and no choledocholithiasis. Electronically signed by: Waddell Calk MD 02/23/2024 05:22 AM EST RP Workstation: [id number]   MR 3D Recon At Scanner Result Date: 02/23/2024 EXAM: MRCP WITH AND WITHOUT IV CONTRAST 02/23/2024 03:50:00 AM TECHNIQUE: Multisequence, multiplanar magnetic resonance images of the abdomen with and without intravenous contrast. MRCP sequences were performed. 10 cc of gadobutrol  (GADAVIST ) 1 MMOL/ML injection was administered. COMPARISON: CT abdomen and pelvis 02/22/2024 and US  abdomen left limited 02/23/2024. CLINICAL HISTORY: Cholelithiasis; Pancreatitis, acute, severe. FINDINGS: LIVER: Diffuse hepatic steatosis. No focal liver lesion identified. GALLBLADDER AND BILIARY SYSTEM: The gallbladder is distended. Gallstones are noted layering within the dependent portion of the gallbladder. These stones measure up to 3 mm. No gallbladder wall thickening or pericholecystic fat stranding or free fluid. Mild fusiform dilatation of the common bile duct measures up to 9 mm. No choledocholithiasis. No intrahepatic ductal dilation. SPLEEN: Unremarkable. PANCREAS/PANCREATIC DUCT: Mild edema involving the pancreas. No main duct dilatation. No signs of pancreatic necrosis or pseudocyst. ADRENAL GLANDS: Unremarkable. KIDNEYS: A 9 mm Bosniak class I cyst arises off the inferior pole of the left kidney. LYMPH NODES: The retroperitoneal lymph node measures 1.1 cm. VASCULATURE: Aortic atherosclerotic calcification. PERITONEUM: No ascites. ABDOMINAL WALL: No hernia. No mass. BOWEL: Grossly unremarkable. No bowel obstruction. BONES: No abnormality. SOFT TISSUES: Unremarkable. IMPRESSION: 1. Acute pancreatitis with mild edema, most likely gallstone pancreatitis (possible recently passed stone), without main duct dilatation, necrosis, or pseudocyst. 2. Cholelithiasis with mild  fusiform dilatation of the common bile duct to 9 mm and no choledocholithiasis. Electronically signed by: Waddell Calk MD 02/23/2024 05:22 AM EST  RP Workstation: [id number]   US  Abdomen Limited RUQ (LIVER/GB) Result Date: 02/23/2024 EXAM: Right Upper Quadrant Abdominal Ultrasound 02/23/2024 01:35:32 AM TECHNIQUE: Real-time ultrasonography of the right upper quadrant of the abdomen was performed. COMPARISON: CT from the previous day. CLINICAL HISTORY: Right upper quadrant pain. FINDINGS: LIVER: Diffuse increased echogenicity is noted within the liver consistent with fatty infiltration. No intrahepatic biliary ductal dilatation is seen. No evidence of mass. Hepatopetal flow in the portal vein. BILIARY SYSTEM: The gallbladder is well distended. No wall thickening or pericholecystic fluid is noted. Cholelithiasis is seen without complicating factors. The common bile duct measures 8.8 mm which is mildly prominent for the patient's given age. Normal tapering distally is noted. IMPRESSION: 1. Cholelithiasis without sonographic evidence of acute cholecystitis. 2. Mildly prominent common bile duct measuring 8.8 mm with normal distal tapering and no intrahepatic ductal dilatation. Electronically signed by: Oneil Devonshire MD 02/23/2024 01:44 AM EST RP Workstation: [id number]   CT ABDOMEN PELVIS W CONTRAST Result Date: 02/23/2024 CLINICAL DATA:  Generalized abdominal pain EXAM: CT ABDOMEN AND PELVIS WITH CONTRAST TECHNIQUE: Multidetector CT imaging of the abdomen and pelvis was performed using the standard protocol following bolus administration of intravenous contrast. RADIATION DOSE REDUCTION: This exam was performed according to the departmental dose-optimization program which includes automated exposure control, adjustment of the mA and/or kV according to patient size and/or use of iterative reconstruction technique. CONTRAST:  80mL OMNIPAQUE  IOHEXOL  350 MG/ML SOLN COMPARISON:  CT 11/12/2023, 05/10/2023 FINDINGS: Lower  chest: Lung bases demonstrate no acute airspace disease. Cardiomegaly and coronary vascular calcification. Hepatobiliary: Hepatic steatosis. Distended gallbladder with small stones. No biliary dilatation Pancreas: Fatty atrophy. Motion degradation, but question subtle peripancreatic haziness Spleen: Normal in size without focal abnormality. Adrenals/Urinary Tract: Adrenal glands are within normal limits. Kidneys show no hydronephrosis. The bladder is unremarkable Stomach/Bowel: Stomach within normal limits. No dilated small bowel. Diverticular disease of the colon without acute wall thickening Vascular/Lymphatic: Aortic atherosclerosis. No aneurysm. Subcentimeter retroperitoneal lymph nodes Reproductive: Uterus and bilateral adnexa are unremarkable. Other: No ascities, or free air. Musculoskeletal: No fracture is seen.Multilevel degenerative change. IMPRESSION: 1. Motion degradation limits evaluation. 2. Question subtle peripancreatic haziness, correlate with laboratory values for possible pancreatitis. 3. Distended gallbladder with small stones. This may be correlated with RUQ US  as indicated. 4. Diverticular disease of the colon without acute wall thickening. 5. Aortic atherosclerosis. Aortic Atherosclerosis (ICD10-I70.0). Electronically Signed   By: Luke Bun M.D.   On: 02/23/2024 00:03   DG Chest Portable 1 View Result Date: 02/22/2024 EXAM: 1 VIEW(S) XRAY OF THE CHEST 02/22/2024 09:31:00 PM COMPARISON: Chest x-ray 05/18/2022. CLINICAL HISTORY: cough, shortness of breath FINDINGS: LUNGS AND PLEURA: Low lung volumes. Streaky opacities at left lung base. No pleural effusion. No pneumothorax. HEART AND MEDIASTINUM: Aortic atherosclerosis. No acute abnormality of the cardiac and mediastinal silhouettes. BONES AND SOFT TISSUES: No acute osseous abnormality. IMPRESSION: 1. Streaky left basilar opacities, favored atelectasis in the setting of low lung volumes. 2. Aortic atherosclerosis. Electronically signed by:  Greig Pique MD 02/22/2024 09:46 PM EST RP Workstation: [id number]     Labs:   Basic Metabolic Panel: Recent Labs  Lab 02/22/24 2022 02/22/24 2123 02/23/24 0428 02/24/24 0229 02/25/24 0248 02/27/24 0320  NA 138 140 140 137 136 134*  K 4.2 4.0 3.3* 3.8 3.4* 4.1  CL 102 100 102 102 100 95*  CO2 22  --  27 24 26 24   GLUCOSE 220* 225* 127* 111* 233* 378*  BUN 13 15 12 12 8  14  CREATININE 0.71 0.60 0.70 0.83 0.66 0.91  CALCIUM  9.7  --  9.3 8.3* 8.6* 9.2   GFR Estimated Creatinine Clearance: 59.8 mL/min (by C-G formula based on SCr of 0.91 mg/dL). Liver Function Tests: Recent Labs  Lab 02/22/24 2022 02/23/24 0428 02/24/24 0229 02/25/24 0248  AST 321* 238* 75* 28  ALT 209* 235* 120* 82*  ALKPHOS 240* 269* 157* 161*  BILITOT 1.9* 0.9 0.7 0.6  PROT 7.3 6.5 5.5* 5.9*  ALBUMIN 3.9 3.8 3.0* 3.2*   Recent Labs  Lab 02/22/24 2022 02/24/24 0229 02/25/24 0248  LIPASE >2,800* 63* 19   No results for input(s): AMMONIA in the last 168 hours. Coagulation profile No results for input(s): INR, PROTIME in the last 168 hours.  CBC: Recent Labs  Lab 02/22/24 2126 02/23/24 0428 02/24/24 0229 02/25/24 0248 02/27/24 0320  WBC 9.5 8.7 9.4 7.8 4.3  NEUTROABS  --   --  6.9 5.3  --   HGB 13.6 12.0 10.6* 10.2* 11.2*  HCT 40.4 36.2 32.5* 30.5* 34.6*  MCV 96.0 96.5 98.5 97.4 96.6  PLT 153 141* PLATELET CLUMPS NOTED ON SMEAR, COUNT APPEARS DECREASED 110* 122*   Cardiac Enzymes: No results for input(s): CKTOTAL, CKMB, CKMBINDEX, TROPONINI in the last 168 hours. BNP: Invalid input(s): POCBNP   D-Dimer No results for input(s): DDIMER in the last 72 hours. Hgb A1c Recent Labs    02/25/24 0248  HGBA1C 7.2*   Lipid Profile No results for input(s): CHOL, HDL, LDLCALC, TRIG, CHOLHDL, LDLDIRECT in the last 72 hours. Thyroid  function studies No results for input(s): TSH, T4TOTAL, T3FREE, THYROIDAB in the last 72 hours.  Invalid input(s):  FREET3 Anemia work up No results for input(s): VITAMINB12, FOLATE, FERRITIN, TIBC, IRON, RETICCTPCT in the last 72 hours. Microbiology Recent Results (from the past 240 hours)  Resp panel by RT-PCR (RSV, Flu A&B, Covid) Anterior Nasal Swab     Status: None   Collection Time: 02/22/24  8:14 PM   Specimen: Anterior Nasal Swab  Result Value Ref Range Status   SARS Coronavirus 2 by RT PCR NEGATIVE NEGATIVE Final   Influenza A by PCR NEGATIVE NEGATIVE Final   Influenza B by PCR NEGATIVE NEGATIVE Final    Comment: (NOTE) The Xpert Xpress SARS-CoV-2/FLU/RSV plus assay is intended as an aid in the diagnosis of influenza from Nasopharyngeal swab specimens and should not be used as a sole basis for treatment. Nasal washings and aspirates are unacceptable for Xpert Xpress SARS-CoV-2/FLU/RSV testing.  Fact Sheet for Patients: bloggercourse.com  Fact Sheet for Healthcare Providers: seriousbroker.it  This test is not yet approved or cleared by the [place]  FDA and has been authorized for detection and/or diagnosis of SARS-CoV-2 by FDA under an Emergency Use Authorization (EUA). This EUA will remain in effect (meaning this test can be used) for the duration of the COVID-19 declaration under Section 564(b)(1) of the Act, 21 U.S.C. section 360bbb-3(b)(1), unless the authorization is terminated or revoked.     Resp Syncytial Virus by PCR NEGATIVE NEGATIVE Final    Comment: (NOTE) Fact Sheet for Patients: bloggercourse.com  Fact Sheet for Healthcare Providers: seriousbroker.it  This test is not yet approved or cleared by the [place]  FDA and has been authorized for detection and/or diagnosis of SARS-CoV-2 by FDA under an Emergency Use Authorization (EUA). This EUA will remain in effect (meaning this test can be used) for the duration of the COVID-19 declaration under  Section 564(b)(1) of the Act, 21 U.S.C. section 360bbb-3(b)(1), unless the authorization is  terminated or revoked.  Performed at [facility], [address] [place], [place] [address]   Blood culture (routine x 2)     Status: None (Preliminary result)   Collection Time: 02/23/24  2:02 AM   Specimen: BLOOD LEFT ARM  Result Value Ref Range Status   Specimen Description BLOOD LEFT ARM  Final   Special Requests   Final    BOTTLES DRAWN AEROBIC AND ANAEROBIC Blood Culture adequate volume   Culture   Final    NO GROWTH 4 DAYS Performed at [facility], [address], [place], [place] [address]    Report Status PENDING  Incomplete  Blood culture (routine x 2)     Status: None (Preliminary result)   Collection Time: 02/24/24  2:25 AM   Specimen: BLOOD RIGHT ARM  Result Value Ref Range Status   Specimen Description BLOOD RIGHT ARM  Final   Special Requests   Final    BOTTLES DRAWN AEROBIC AND ANAEROBIC Blood Culture adequate volume PATIENT ON FOLLOWING PIPERACILLIN ,TAZOBACTAM   Culture   Final    NO GROWTH 3 DAYS Performed at [facility], [address] [place], [place] [address]    Report Status PENDING  Incomplete  MRSA Next Gen by PCR, Nasal     Status: Abnormal   Collection Time: 02/24/24  5:10 PM   Specimen: Nasal Mucosa; Nasal Swab  Result Value Ref Range Status   MRSA by PCR Next Gen DETECTED (A) NOT DETECTED Final    Comment: RESULT CALLED TO, READ BACK BY AND VERIFIED WITH: RN TYRONNE BATTLE [phone] AT 1943 ADC (NOTE) The GeneXpert MRSA Assay (FDA approved for NASAL specimens only), is one component of a comprehensive MRSA colonization surveillance program. It is not intended to diagnose MRSA infection nor to guide or monitor treatment for MRSA infections. Test performance is not FDA approved in patients less than 94 years old. Performed at [facility], [address], [place], [place] [address]   Surgical pcr screen     Status: Abnormal    Collection Time: 02/25/24  8:22 PM   Specimen: Nasal Mucosa; Nasal Swab  Result Value Ref Range Status   MRSA, PCR POSITIVE (A) NEGATIVE Final    Comment: CRITICAL RESULT CALLED TO, READ BACK BY AND VERIFIED WITH:  ANN RN 02/25/2024 BY DD @ 2354    Staphylococcus aureus POSITIVE (A) NEGATIVE Final    Comment: CRITICAL RESULT CALLED TO, READ BACK BY AND VERIFIED WITH:  ANN RN 02/25/2024 BY DD @ 2354 (NOTE) The Xpert SA Assay (FDA approved for NASAL specimens in patients 83 years of age and older), is one component of a comprehensive surveillance program. It is not intended to diagnose infection nor to guide or monitor treatment. Performed at [facility], [address] [place], [place] [address]      Discharge Instructions:   Discharge Instructions     Diet Carb Modified   Complete by: As directed    Discharge instructions   Complete by: As directed    Adjust diabetic meds for better BS control Bowel regimen   Increase activity slowly   Complete by: As directed    No wound care   Complete by: As directed       Allergies as of 02/27/2024       Reactions   Vancomycin  Itching, Other (See Comments)   Patient becomes hypotensive, lethargic and itchy. Has tolerated it many times. **not listed on  the [facility]**   Emend [aprepitant] Other (See Comments)   Pt experienced shooting back pain at 10/10 when medication was administered the first time. See Hypersensitivity note from 01/17/2022. **not listed on the [facility]   Fosaprepitant  Other (See Comments)   Back pain / hypersensitivity 01/17/2022 **not listed on the MAR**   Lasix  [furosemide ] Other (See Comments)   IV-LASIX  ==> Reaction: Burning   Penicillins    Unknown reaction per MAR   Pineapple Rash        Medication List     PAUSE taking these medications    amLODipine  10 MG tablet Wait to take this until your doctor or other care provider tells you to start again. Commonly known as: NORVASC  Take 10 mg by  mouth daily.   lisinopril  5 MG tablet Wait to take this until your doctor or other care provider tells you to start again. Commonly known as: ZESTRIL  Take 5 mg by mouth daily.       TAKE these medications    acetaminophen  500 MG tablet Commonly known as: TYLENOL  Take 2 tablets (1,000 mg total) by mouth every 6 (six) hours as needed. What changed:  when to take this reasons to take this   albuterol  108 (90 Base) MCG/ACT inhaler Commonly known as: VENTOLIN  HFA Inhale 2 puffs into the lungs every 4 (four) hours as needed for wheezing or shortness of breath.   allopurinol  100 MG tablet Commonly known as: ZYLOPRIM  Take 100 mg by mouth daily.   ARIPiprazole 5 MG tablet Commonly known as: ABILIFY Take 5 mg by mouth daily.   Aspercreme Lidocaine  4 % Generic drug: lidocaine  Place 1 patch onto the skin See admin instructions. Apply 1 patch to both knees once a day- remove as directed   lidocaine  4 % Place 2 patches onto the skin daily. Place 1 patch on each shoulder   carvedilol  6.25 MG tablet Commonly known as: COREG  Take 6.25 mg by mouth 2 (two) times daily with a meal.   cholecalciferol  25 MCG (1000 UNIT) tablet Commonly known as: VITAMIN D3 Take 2,000 Units by mouth daily.   doxepin  25 MG capsule Commonly known as: SINEQUAN  Take 25 mg by mouth at bedtime.   EUCERIN BABY EX Apply 1 application  topically See admin instructions. Apply to affected areas daily where the skin is peeling   mineral oil-hydrophilic petrolatum ointment Apply 1 Application topically in the morning and at bedtime.   famotidine  20 MG tablet Commonly known as: PEPCID  Take 20 mg by mouth every 12 (twelve) hours.   fluticasone  50 MCG/ACT nasal spray Commonly known as: FLONASE  Place 1 spray into both nostrils daily.   Gas-X Ultra Strength 180 MG Caps Generic drug: Simethicone  Take 1 capsule by mouth every 8 (eight) hours as needed (gas).   glipiZIDE 10 MG 24 hr tablet Commonly known as:  GLUCOTROL XL Take 10 mg by mouth in the morning and at bedtime.   hydrOXYzine  25 MG tablet Commonly known as: ATARAX  Take 25 mg by mouth in the morning and at bedtime.   insulin  aspart 100 UNIT/ML injection Commonly known as: NovoLOG  Inject 0-12 Units into the skin See admin instructions. Inject 0-12 units into the skin 4 times a day (before meals and at bedtime) per sliding scale: BGL <60 or >400  = CALL MD; 60-200 = 0 units; 201-250 = 2 units; 251-300 = 4 units; 301-350 = 6 units; 351-400 = 8 units; 401-450 = 10 units; >450 = 12 units   Lantus  SoloStar  100 UNIT/ML Solostar Pen Generic drug: insulin  glargine Inject 24-38 Units into the skin See admin instructions. Inject 38 units into the skin in the afternoon and 24 units at night   lidocaine -prilocaine  cream Commonly known as: EMLA  Apply 1 Application topically as needed. What changed: additional instructions   loperamide  2 MG capsule Commonly known as: IMODIUM  Take 1 capsule (2 mg total) by mouth as needed for diarrhea or loose stools.   loratadine  10 MG tablet Commonly known as: CLARITIN  Take 10 mg by mouth daily.   methocarbamol  500 MG tablet Commonly known as: ROBAXIN  Take 1 tablet (500 mg total) by mouth every 6 (six) hours as needed for muscle spasms.   ondansetron  4 MG tablet Commonly known as: ZOFRAN  Take 1 tablet (4 mg total) by mouth every 6 (six) hours as needed for nausea.   oxyCODONE  5 MG immediate release tablet Commonly known as: Oxy IR/ROXICODONE  Take 1-2 tablets (5-10 mg total) by mouth every 4 (four) hours as needed for moderate pain (pain score 4-6) or severe pain (pain score 7-10) (5mg  for mod, 10mg  severe).   polyethylene glycol 17 g packet Commonly known as: MIRALAX  / GLYCOLAX  Take 17 g by mouth daily.   sodium chloride  0.65 % Soln nasal spray Commonly known as: OCEAN Place 2 sprays into both nostrils every 2 (two) hours as needed for congestion.   Systane Balance 0.6 % Soln Generic drug:  Propylene Glycol Place 2 drops into both eyes every 12 (twelve) hours.        Contact information for follow-up providers     Maczis, Puja Gosai, PA-C Follow up on 03/19/2024.   Specialty: General Surgery Why: 1:30pm, Arrive 30 minutes prior to your appointment time, Please bring your insurance card and photo ID Contact information: [address] [facility] [place] [address] [phone]              Contact information for after-discharge care     Destination     [facility] .   Service: Skilled Nursing Contact information: [address] [place]  [address] [phone]                      Time coordinating discharge: 45 min  Signed:  Harlene RAYMOND Bowl DO  [facility] 02/27/2024, 12:04 PM      "

## 2024-02-27 NOTE — TOC Transition Note (Signed)
 Transition of Care [facility]) - Discharge Note   Patient Details  Name: Heather Petty MRN: [id number] Date of Birth: 24-Oct-1950  Transition [facility]) CM/SW Contact:  Luise JAYSON Pan, LCSWA Phone Number: 02/27/2024, 1:33 PM   Clinical Narrative:   Patient will DC to: [facility] Anticipated DC date: 02/27/24  Family notified: Daina Kirsten [phone]  Transport by: [facility]   Per MD patient ready for DC to [facility]. RN to call report prior to discharge [phone]; room 302). RN, patient, patient's family, and facility notified of DC. Discharge Summary and FL2 sent to facility. DC packet on chart. Ambulance transport requested for patient 1:34 PM.   CSW will sign off for now as social work intervention is no longer needed. Please consult us  again if new needs arise.      Final next level of care: Skilled Nursing Facility Barriers to Discharge: Barriers Resolved   Patient Goals and CMS Choice Patient states their goals for this hospitalization and ongoing recovery are:: Return to ltc CMS Medicare.gov Compare Post Acute Care list provided to::  (NA) Choice offered to / list presented to : NA [place] ownership interest in [facility].provided to::  (NA)    Discharge Placement              Patient chooses bed at: [facility] Patient to be transferred to facility by: [facility] Name of family member notified: Daina Kirsten  [phone] Patient and family notified of of transfer: 02/27/24  Discharge Plan and Services Additional resources added to the After Visit Summary for   In-house Referral: Clinical Social Work   Post Acute Care Choice: Skilled Nursing Facility                               Social Drivers of Health (SDOH) Interventions SDOH Screenings   Food Insecurity: No Food Insecurity (02/24/2024)  Housing: Low Risk (02/24/2024)  Transportation Needs: No Transportation Needs (02/24/2024)  Utilities: Not At Risk  (02/24/2024)  Tobacco Use: Low Risk (02/26/2024)     Readmission Risk Interventions     No data to display

## 2024-02-27 NOTE — Telephone Encounter (Signed)
 Called pt to rescheduled missed appts due to recent surgery

## 2024-02-27 NOTE — Inpatient Diabetes Management (Addendum)
 Inpatient Diabetes Program Recommendations  AACE/ADA: New Consensus Statement on Inpatient Glycemic Control (2015)  Target Ranges:  Prepandial:   less than 140 mg/dL      Peak postprandial:   less than 180 mg/dL (1-2 hours)      Critically ill patients:  140 - 180 mg/dL   Lab Results  Component Value Date   GLUCAP 406 (H) 02/27/2024   HGBA1C 7.2 (H) 02/25/2024    Review of Glycemic Control  Latest Reference Range & Units 02/26/24 10:44 02/26/24 13:16 02/26/24 15:57 02/26/24 18:15 02/26/24 21:24 02/27/24 05:58 02/27/24 11:04  Glucose-Capillary 70 - 99 mg/dL 859 (H) 871 (H) 828 (H) 219 (H) 326 (H) 346 (H) 406 (H)  (H): Data is abnormally high  Diabetes history: DM2  Outpatient Diabetes medications:  Lantus  38 units QAM, 24 units at bedtime Novolog  0-12 units TID  Current orders for Inpatient glycemic control:  Lantus  10 units every day Novolog  0-15 units TID  Inpatient Diabetes Program Recommendations:    Lantus  20 units BID Novolog  3 units TID with meals if she consumes at least 50%.  Thank you, Wyvonna Pinal, MSN, CDCES Diabetes Coordinator Inpatient Diabetes Program [phone] (team pager from 8a-5p)

## 2024-02-27 NOTE — Progress Notes (Signed)
 Discharge Summary: DC order noted per MD. DC RN at bedside. Med details completed. AVS printed, set with the discharge packet. No home/TOC meds.   Rosario Lund, RN

## 2024-02-27 NOTE — Progress Notes (Signed)
 "   1 Day Post-Op  Subjective: Having pain this am.  Got some morphine , oxy, and tylenol  earlier this am.  Pain is currently better.  Hasn't mobilized, is wheelchair bound.  Ate some spaghetti last night  Objective: Vital signs in last 24 hours: Temp:  [97.7 F (36.5 C)-100 F (37.8 C)] 98.3 F (36.8 C) (01/21 0400) Pulse Rate:  [60-85] 74 (01/21 0731) Resp:  [17-24] 18 (01/21 0731) BP: (99-184)/(46-79) 125/52 (01/21 0731) SpO2:  [87 %-98 %] 97 % (01/21 0731) Last BM Date : 02/22/24  Intake/Output from previous day: 01/20 0701 - 01/21 0700 In: 934.4 [I.V.:800; IV Piggyback:134.4] Out: 765 [Urine:750; Blood:15] Intake/Output this shift: No intake/output data recorded.  PE: Gen: NAD Lungs: effort unlabored Abd: soft, appropriately tender, incisions c/d/I, ND  Lab Results:  Recent Labs    02/25/24 0248 02/27/24 0320  WBC 7.8 4.3  HGB 10.2* 11.2*  HCT 30.5* 34.6*  PLT 110* 122*   BMET Recent Labs    02/25/24 0248 02/27/24 0320  NA 136 134*  K 3.4* 4.1  CL 100 95*  CO2 26 24  GLUCOSE 233* 378*  BUN 8 14  CREATININE 0.66 0.91  CALCIUM  8.6* 9.2   PT/INR No results for input(s): LABPROT, INR in the last 72 hours. CMP     Component Value Date/Time   NA 134 (L) 02/27/2024 0320   K 4.1 02/27/2024 0320   CL 95 (L) 02/27/2024 0320   CO2 24 02/27/2024 0320   GLUCOSE 378 (H) 02/27/2024 0320   BUN 14 02/27/2024 0320   CREATININE 0.91 02/27/2024 0320   CREATININE 0.82 11/28/2023 1120   CALCIUM  9.2 02/27/2024 0320   PROT 5.9 (L) 02/25/2024 0248   ALBUMIN 3.2 (L) 02/25/2024 0248   AST 28 02/25/2024 0248   AST 16 11/28/2023 1120   ALT 82 (H) 02/25/2024 0248   ALT 14 11/28/2023 1120   ALKPHOS 161 (H) 02/25/2024 0248   BILITOT 0.6 02/25/2024 0248   BILITOT 0.5 11/28/2023 1120   GFRNONAA >60 02/27/2024 0320   GFRNONAA >60 11/28/2023 1120   GFRAA >60 09/08/2019 0050   Lipase     Component Value Date/Time   LIPASE 19 02/25/2024 0248        Studies/Results: No results found.  Anti-infectives: Anti-infectives (From admission, onward)    Start     Dose/Rate Route Frequency Ordered Stop   02/23/24 0800  piperacillin -tazobactam (ZOSYN ) IVPB 3.375 g  Status:  Discontinued        3.375 g 12.5 mL/hr over 240 Minutes Intravenous Every 8 hours 02/23/24 0059 02/27/24 0802   02/23/24 0100  piperacillin -tazobactam (ZOSYN ) IVPB 3.375 g        3.375 g 100 mL/hr over 30 Minutes Intravenous  Once 02/23/24 0059 02/23/24 0234        Assessment/Plan POD 1, s/p lap chole by Dr. Dasie, 1/21 for Gallstone pancreatitis -labs stable -pancreatitis improving  -tolerating HH diet last night -some pain overnight, but better controlled this am with multi-modal pain control.  Will adjust pain meds a bit to see if we can get her off IV pain meds -if pain better controlled later today, stable for DC back to facility from surgical standpoint. 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

## 2024-02-27 NOTE — Care Management Important Message (Signed)
 Important Message  Patient Details  Name: Heather Petty MRN: [id number] Date of Birth: [birth year]   Important Message Given:  Yes - Medicare IM     Vonzell Arrie Sharps 02/27/2024, 8:26 AM

## 2024-02-28 ENCOUNTER — Other Ambulatory Visit: Payer: Self-pay

## 2024-02-28 LAB — CULTURE, BLOOD (ROUTINE X 2)
Culture: NO GROWTH
Special Requests: ADEQUATE

## 2024-02-28 LAB — SURGICAL PATHOLOGY

## 2024-02-29 ENCOUNTER — Inpatient Hospital Stay: Admitting: Physician Assistant

## 2024-02-29 ENCOUNTER — Inpatient Hospital Stay

## 2024-02-29 LAB — CULTURE, BLOOD (ROUTINE X 2): Culture: NO GROWTH

## 2024-03-03 ENCOUNTER — Other Ambulatory Visit: Payer: Self-pay

## 2024-04-02 ENCOUNTER — Inpatient Hospital Stay

## 2024-04-02 ENCOUNTER — Inpatient Hospital Stay: Admitting: Physician Assistant
